# Patient Record
Sex: Male | Born: 1940 | Race: White | Hispanic: No | Marital: Married | Smoking: Former smoker
Health system: Southern US, Community
[De-identification: ages and names within clinical notes are randomized; demographics above are authoritative.]

## PROBLEM LIST (undated history)

## (undated) DIAGNOSIS — E7800 Pure hypercholesterolemia, unspecified: Secondary | ICD-10-CM

## (undated) DIAGNOSIS — Z9289 Personal history of other medical treatment: Secondary | ICD-10-CM

## (undated) DIAGNOSIS — I2510 Atherosclerotic heart disease of native coronary artery without angina pectoris: Secondary | ICD-10-CM

## (undated) DIAGNOSIS — I4891 Unspecified atrial fibrillation: Secondary | ICD-10-CM

## (undated) DIAGNOSIS — M109 Gout, unspecified: Secondary | ICD-10-CM

## (undated) DIAGNOSIS — M542 Cervicalgia: Secondary | ICD-10-CM

## (undated) DIAGNOSIS — N400 Enlarged prostate without lower urinary tract symptoms: Secondary | ICD-10-CM

## (undated) DIAGNOSIS — N19 Unspecified kidney failure: Secondary | ICD-10-CM

## (undated) DIAGNOSIS — D509 Iron deficiency anemia, unspecified: Secondary | ICD-10-CM

## (undated) DIAGNOSIS — M1990 Unspecified osteoarthritis, unspecified site: Secondary | ICD-10-CM

## (undated) DIAGNOSIS — M5030 Other cervical disc degeneration, unspecified cervical region: Secondary | ICD-10-CM

## (undated) DIAGNOSIS — D649 Anemia, unspecified: Secondary | ICD-10-CM

## (undated) DIAGNOSIS — N183 Chronic kidney disease, stage 3 unspecified: Secondary | ICD-10-CM

## (undated) DIAGNOSIS — M48061 Spinal stenosis, lumbar region without neurogenic claudication: Secondary | ICD-10-CM

## (undated) DIAGNOSIS — N289 Disorder of kidney and ureter, unspecified: Secondary | ICD-10-CM

## (undated) DIAGNOSIS — I10 Essential (primary) hypertension: Secondary | ICD-10-CM

## (undated) DIAGNOSIS — E669 Obesity, unspecified: Secondary | ICD-10-CM

## (undated) DIAGNOSIS — K219 Gastro-esophageal reflux disease without esophagitis: Secondary | ICD-10-CM

## (undated) DIAGNOSIS — G4733 Obstructive sleep apnea (adult) (pediatric): Secondary | ICD-10-CM

## (undated) DIAGNOSIS — I4510 Unspecified right bundle-branch block: Secondary | ICD-10-CM

## (undated) DIAGNOSIS — I82409 Acute embolism and thrombosis of unspecified deep veins of unspecified lower extremity: Secondary | ICD-10-CM

## (undated) DIAGNOSIS — M609 Myositis, unspecified: Secondary | ICD-10-CM

## (undated) DIAGNOSIS — Z9989 Dependence on other enabling machines and devices: Secondary | ICD-10-CM

## (undated) DIAGNOSIS — Z950 Presence of cardiac pacemaker: Secondary | ICD-10-CM

## (undated) DIAGNOSIS — Z8619 Personal history of other infectious and parasitic diseases: Secondary | ICD-10-CM

## (undated) DIAGNOSIS — N2889 Other specified disorders of kidney and ureter: Secondary | ICD-10-CM

## (undated) HISTORY — PX: INGUINAL HERNIA REPAIR: SUR1180

## (undated) HISTORY — DX: Unspecified right bundle-branch block: I45.10

## (undated) HISTORY — PX: TONSILLECTOMY: SUR1361

## (undated) HISTORY — DX: Dependence on other enabling machines and devices: Z99.89

## (undated) HISTORY — PX: APPENDECTOMY: SHX54

## (undated) HISTORY — DX: Obesity, unspecified: E66.9

## (undated) HISTORY — DX: Personal history of other medical treatment: Z92.89

## (undated) HISTORY — DX: Obstructive sleep apnea (adult) (pediatric): G47.33

## (undated) HISTORY — DX: Benign prostatic hyperplasia without lower urinary tract symptoms: N40.0

---

## 1990-01-09 HISTORY — PX: CORONARY ARTERY BYPASS GRAFT: SHX141

## 1997-10-15 ENCOUNTER — Ambulatory Visit (HOSPITAL_COMMUNITY): Admission: RE | Admit: 1997-10-15 | Discharge: 1997-10-15 | Payer: Self-pay | Admitting: Cardiology

## 1997-12-08 ENCOUNTER — Ambulatory Visit: Admission: RE | Admit: 1997-12-08 | Discharge: 1997-12-08 | Payer: Self-pay | Admitting: Cardiology

## 1999-10-21 ENCOUNTER — Inpatient Hospital Stay (HOSPITAL_COMMUNITY): Admission: EM | Admit: 1999-10-21 | Discharge: 1999-10-25 | Payer: Self-pay | Admitting: Emergency Medicine

## 1999-10-21 ENCOUNTER — Encounter: Payer: Self-pay | Admitting: Internal Medicine

## 1999-10-22 ENCOUNTER — Encounter: Payer: Self-pay | Admitting: Internal Medicine

## 1999-10-25 ENCOUNTER — Encounter: Payer: Self-pay | Admitting: Internal Medicine

## 2001-05-20 ENCOUNTER — Ambulatory Visit (HOSPITAL_COMMUNITY): Admission: RE | Admit: 2001-05-20 | Discharge: 2001-05-20 | Payer: Self-pay | Admitting: *Deleted

## 2001-05-20 ENCOUNTER — Encounter (INDEPENDENT_AMBULATORY_CARE_PROVIDER_SITE_OTHER): Payer: Self-pay

## 2003-05-06 ENCOUNTER — Encounter (INDEPENDENT_AMBULATORY_CARE_PROVIDER_SITE_OTHER): Payer: Self-pay | Admitting: Specialist

## 2003-05-06 ENCOUNTER — Ambulatory Visit (HOSPITAL_COMMUNITY): Admission: RE | Admit: 2003-05-06 | Discharge: 2003-05-06 | Payer: Self-pay | Admitting: *Deleted

## 2004-06-08 ENCOUNTER — Emergency Department (HOSPITAL_COMMUNITY): Admission: EM | Admit: 2004-06-08 | Discharge: 2004-06-08 | Payer: Self-pay | Admitting: *Deleted

## 2007-01-10 HISTORY — PX: CARDIAC CATHETERIZATION: SHX172

## 2009-05-09 DIAGNOSIS — Z9289 Personal history of other medical treatment: Secondary | ICD-10-CM

## 2009-05-09 HISTORY — DX: Personal history of other medical treatment: Z92.89

## 2009-06-29 ENCOUNTER — Emergency Department (HOSPITAL_COMMUNITY): Admission: EM | Admit: 2009-06-29 | Discharge: 2009-06-29 | Payer: Self-pay | Admitting: Emergency Medicine

## 2009-07-14 ENCOUNTER — Ambulatory Visit (HOSPITAL_BASED_OUTPATIENT_CLINIC_OR_DEPARTMENT_OTHER): Admission: RE | Admit: 2009-07-14 | Discharge: 2009-07-14 | Payer: Self-pay | Admitting: Urology

## 2010-01-29 ENCOUNTER — Encounter: Payer: Self-pay | Admitting: Internal Medicine

## 2010-03-27 LAB — URINE CULTURE
Colony Count: NO GROWTH
Culture: NO GROWTH

## 2010-03-27 LAB — POCT I-STAT, CHEM 8
BUN: 42 mg/dL — ABNORMAL HIGH (ref 6–23)
BUN: 42 mg/dL — ABNORMAL HIGH (ref 6–23)
Calcium, Ion: 1.26 mmol/L (ref 1.12–1.32)
Calcium, Ion: 1.15 mmol/L (ref 1.12–1.32)
Chloride: 105 mEq/L (ref 96–112)
Chloride: 105 mEq/L (ref 96–112)
Creatinine, Ser: 1.6 mg/dL — ABNORMAL HIGH (ref 0.4–1.5)
Creatinine, Ser: 1.9 mg/dL — ABNORMAL HIGH (ref 0.4–1.5)
Glucose, Bld: 109 mg/dL — ABNORMAL HIGH (ref 70–99)
Glucose, Bld: 123 mg/dL — ABNORMAL HIGH (ref 70–99)
HCT: 47 % (ref 39.0–52.0)
HCT: 51 % (ref 39.0–52.0)
Hemoglobin: 16 g/dL (ref 13.0–17.0)
Hemoglobin: 17.3 g/dL — ABNORMAL HIGH (ref 13.0–17.0)
Potassium: 4.2 mEq/L (ref 3.5–5.1)
Potassium: 4.1 mEq/L (ref 3.5–5.1)
Sodium: 141 mEq/L (ref 135–145)
Sodium: 138 mEq/L (ref 135–145)
TCO2: 28 mmol/L (ref 0–100)
TCO2: 27 mmol/L (ref 0–100)

## 2010-03-27 LAB — URINALYSIS, ROUTINE W REFLEX MICROSCOPIC
Bilirubin Urine: NEGATIVE
Glucose, UA: NEGATIVE mg/dL
Ketones, ur: NEGATIVE mg/dL
Nitrite: NEGATIVE
Protein, ur: 30 mg/dL — AB
Specific Gravity, Urine: 1.016 (ref 1.005–1.030)
Urobilinogen, UA: 1 mg/dL (ref 0.0–1.0)
pH: 6.5 (ref 5.0–8.0)

## 2010-03-27 LAB — URINE MICROSCOPIC-ADD ON

## 2010-04-10 HISTORY — PX: CORONARY ANGIOPLASTY WITH STENT PLACEMENT: SHX49

## 2010-04-11 ENCOUNTER — Ambulatory Visit
Admission: RE | Admit: 2010-04-11 | Discharge: 2010-04-11 | Disposition: A | Discharge status: Home / Self Care | Payer: Self-pay | Source: Ambulatory Visit | Attending: Cardiovascular Disease | Admitting: Cardiovascular Disease

## 2010-04-11 ENCOUNTER — Other Ambulatory Visit: Payer: Self-pay | Admitting: Cardiovascular Disease

## 2010-04-11 DIAGNOSIS — R0602 Shortness of breath: Secondary | ICD-10-CM

## 2010-04-12 ENCOUNTER — Observation Stay (HOSPITAL_COMMUNITY)
Admission: RE | Admit: 2010-04-12 | Discharge: 2010-04-13 | Disposition: A | Discharge status: Home / Self Care | Payer: Medicare Other | Source: Ambulatory Visit | Attending: Cardiovascular Disease | Admitting: Cardiovascular Disease

## 2010-04-12 DIAGNOSIS — Z01810 Encounter for preprocedural cardiovascular examination: Secondary | ICD-10-CM | POA: Insufficient documentation

## 2010-04-12 DIAGNOSIS — I2510 Atherosclerotic heart disease of native coronary artery without angina pectoris: Principal | ICD-10-CM | POA: Insufficient documentation

## 2010-04-12 DIAGNOSIS — I498 Other specified cardiac arrhythmias: Secondary | ICD-10-CM | POA: Insufficient documentation

## 2010-04-12 DIAGNOSIS — G4733 Obstructive sleep apnea (adult) (pediatric): Secondary | ICD-10-CM | POA: Insufficient documentation

## 2010-04-12 LAB — POCT ACTIVATED CLOTTING TIME: Activated Clotting Time: 387 seconds

## 2010-04-13 LAB — BASIC METABOLIC PANEL
BUN: 15 mg/dL (ref 6–23)
CO2: 22 mEq/L (ref 19–32)
Calcium: 8.8 mg/dL (ref 8.4–10.5)
Chloride: 108 mEq/L (ref 96–112)
Creatinine, Ser: 1.28 mg/dL (ref 0.4–1.5)
GFR calc Af Amer: >60 mL/min (ref >60)
GFR calc non Af Amer: 55 mL/min — ABNORMAL LOW (ref >60)
Glucose, Bld: 112 mg/dL — ABNORMAL HIGH (ref 70–99)
Potassium: 4 mEq/L (ref 3.5–5.1)
Sodium: 138 mEq/L (ref 135–145)

## 2010-04-13 LAB — CBC
HCT: 39.6 % (ref 39.0–52.0)
Hemoglobin: 13.3 g/dL (ref 13.0–17.0)
MCH: 30.2 pg (ref 26.0–34.0)
MCHC: 33.6 g/dL (ref 30.0–36.0)
MCV: 90 fL (ref 78.0–100.0)
Platelets: 139 10*3/uL — ABNORMAL LOW (ref 150–400)
RBC: 4.4 MIL/uL (ref 4.22–5.81)
RDW: 14.2 % (ref 11.5–15.5)
WBC: 6.3 10*3/uL (ref 4.0–10.5)

## 2010-04-21 NOTE — Discharge Summary (Signed)
Paul Price, Paul Price                ACCOUNT NO.:  [id number]  MEDICAL RECORD NO.:  [id number]           PATIENT TYPE:  O  LOCATION:  [id number]                         FACILITY:  [facility]  PHYSICIAN:  Shelva Majestic, M.D.     DATE OF BIRTH:  1940/07/03  DATE OF ADMISSION:  04/12/2010 DATE OF DISCHARGE:  04/13/2010                              DISCHARGE SUMMARY   DISCHARGE DIAGNOSES: 1. Positive nuclear stress test with new ischemia. 2. Coronary disease with bypass grafting in 1992 with graft     dysfunction by cath in 2009.     a.     New left anterior descending stenosis, undergoing      percutaneous transluminal coronary angioplasty and stent      deployment with a Promus drug-eluting stent to the left anterior      descending. 3. Obstructive sleep apnea with continuous positive airway pressure. 4. Normal ejection fraction of 55%. 5. Tachycardia and bradycardia with heart rate in the 50s.  He did     have several pauses.  He also had burst of tachycardia at rate of     100.     a.     Event monitor as an outpatient.  DISCHARGE CONDITION:  Stable.  PROCEDURES: 1. April 12, 2010, combined left heart cath with graft visualization by     Dr. Claiborne Billings. 2. April 12, 2010, percutaneous transluminal coronary angioplasty and     stent deployment to the left anterior descending stenosis with a     drug-eluting stent by Dr. Claiborne Billings.  DISCHARGE MEDICATIONS:  See medication reconciliation sheet from [facility]. Effient was added to his medications.  We did decrease his lisinopril to 20 mg until he sees Dr. Claiborne Billings.  We also held his hydrochlorothiazide for 2 days.  Also on his Ranexa, it had been increased to 1000 mg twice a day but it made him nauseated, so as his native LAD lesion was opened, we went ahead and decreased this to 500 mg twice a day.  Also for arthritic pains he has, we asked him to only take Tylenol, not extra aspirin as he is on Effient.  DISCHARGE INSTRUCTIONS: 1. Increase activity  slowly.  May shower.  No lifting for 1 week.  No     driving for 2 days.  Low-sodium heart-healthy diet. 2. Use CPAP as before. 3. Office will call you about a monitor for slow heart rate. 4. Wash cath site with soap and water.  Call if any bleeding,     swelling, or drainage. 5. Follow up with Dr. Claiborne Billings on April 22, 2010, at 9:15 a.m.  We will ensure he will not have trouble affording his Effient prior to his discharge.  HOSPITAL COURSE:  Paul Price presented for elective cardiac catheterization after positive stress test with his known history of coronary artery disease.  He had also had episodes of bradycardia noted in the office with adjustments to his beta-blocker.  He came in, underwent cardiac catheterization for a positive stress test, and was found to have 75% LAD stenosis of the native vessel, underwent PTCA and stent deployment with a Promus  drug-eluting stent.  The patient tolerated the procedure.  Postprocedure by the morning of April 13, 2010, he ambulated well without complications.  Blood pressure 121/58, pulse 59, respiratory rate 18, temperature 97.5, oxygen saturation 96%.  Heart regular rate and rhythm.  Lungs were clear. Groin was stable.  Sodium 138, potassium 4, BUN 15, creatinine 1.28, glucose 112.  Hemoglobin 13.3, hematocrit 39.6, platelets 139, WBC 6.3.  Please note the patient has syncope on nitrates.  Dr. Gwenlyn Found saw him and felt he was ready for discharge.  He will follow up with Dr. Claiborne Billings.     Otilio Carpen. Dorene Ar, N.P.   ______________________________ Shelva Majestic, M.D.    LRI/MEDQ  D:  04/13/2010  T:  04/13/2010  Job:  [id number]  cc:   Lynnell Chad. Shelia Media, M.D.  Electronically Signed by Cecilie Kicks N.P. on 04/15/2010 08:05:30 AM Electronically Signed by Shelva Majestic M.D. on 04/21/2010 10:54:08 AM

## 2010-04-21 NOTE — Procedures (Signed)
NAMETANYON, SAMS NO.:  [id number]  MEDICAL RECORD NO.:  [id number]           PATIENT TYPE:  O  LOCATION:  [id number]                         FACILITY:  [facility]  PHYSICIAN:  Shelva Majestic, M.D.     DATE OF BIRTH:  1940/10/19  DATE OF PROCEDURE:  04/12/2010 DATE OF DISCHARGE:                           CARDIAC CATHETERIZATION   INDICATIONS:  [name] is a 70 year old gentleman who underwent CABG revascularization surgery remotely and had a LIMA placed to his LAD and a vein to his diagonal vessel.  In 2009, cardiac catheterization showed an atretic LIMA graft to his LAD and he had a patent vein graft to his diagonal vessel.  He had 70% stenosis in the LAD after diagonal and septal perforating artery proximal to the LIMA insertion. Subsequent nuclear imaging since then has shown only mild thinning in the anterior to anteroseptal segment without ischemia.  Recently, he had developed episodes of recurrent chest pain.  He underwent a nuclear perfusion study last week which now demonstrated a moderate area of new ischemia in the mid distal LAD territory concordant with his LAD stenosis.  He was seen in the office yesterday.  His Ranexa dose was increased.  He is scheduled for cardiac catheterization today.  We will do cardiac catheterization today with probable PCI to his LAD system in light of physiologic ischemia noted on his functional study.  PROCEDURE:  After premedication with Versed 2 mg plus fentanyl 50 mcg, the patient was prepped and draped in usual fashion.  His right femoral artery was punctured anteriorly and a 5-French sheath was inserted. Diagnostic cardiac catheterization was done utilizing 5-French Judkins for left and right coronary catheters.  The right catheter was used for selective angiography and the vein graft supplying the diagonal vessel as well as selective angiography in the left internal mammary artery.  A 5-French pigtail catheter was  used for RAO ventriculography.  With the demonstration of slightly progressive 75% LAD stenosis with clear-cut objective evidence for ischemia on his nuclear scan with increasing symptomatology of chest pain,the decision was made to proceed with intervention to this LAD system.  The 5-French sheath was upgraded to a 6-French system.  Bivalirudin bolus plus infusion was administered.  The patient received 60 mg of oral Effient for antiplatelet therapy.  He already received aspirin today.  A 6-French [id number] guide was used.  The patient did have three doses of 100 mcg of intracoronary nitroglycerin during the procedure and tolerated this well with increased fluid hydration.  After demonstration of therapeutic anticoagulation, the Prowater wire was advanced down the LAD system.  Predilatation was done utilizing a 2.5- x 12-mm apex balloon.  A 3.0- x 20-mm DES Promus Element stent was then inserted and dilated x2 up to 13 atmospheres.  A 3.25- x 15-mm noncompliant Quantum balloon was used for poststent dilatation up to 3.25 mm.  [facility]out angiography confirmed an excellent angiographic result with the 75% to 80% stenosis being reduced to 0%. The arterial sheath was sutured in place with plans for sheath removal later today.  HEMODYNAMIC DATA:  Central aortic pressure was 112/55, left  ventricular pressure was 112/10, post A-wave 19.  ANGIOGRAPHIC DATA: 1. Left main coronary artery was a long vessel which had mild 10% to     less than 20% ostial smooth narrowing.  The left main coronary     artery bifurcated into an LAD and left circumflex system. 2. The LAD gave rise to a proximal diagonal vessel which had been     bypassed with the previous vein graft.  Just after this diagonal     vessel, there was a moderate-sized septal perforating artery and     following this was 75% to 80% area of narrowing in the LAD.  The     remainder of the LAD was free of significant disease. 3. The circumflex vessel  was angiographically normal and gave rise to     one major marginal vessel. 4. The right coronary artery was angiographically normal dominant     vessel which supplied the PDA and inferior LV branch and large     posterolateral system. 5. The vein graft supplying the diagonal vessel was widely patent. 6. The LIMA graft which had supplied the LAD was atretic and did not     reach the mid LAD. 7. The RAO ventriculography revealed preserved global contractility     with ejection fraction of at least 55% with only a very minimal     area of mild mid anterolateral hypocontractility.  Following percutaneous coronary intervention to the native LAD with PTCA, stenting with a 3.0- x 20-mm DES Promus Element stent postdilated to 3.25 mm, the 75% to 80% stenosis was reduced to 0%.  There was brisk TIMI 3 flow.  There was no evidence for dissection.  IMPRESSION: 1. Normal left ventricular function with a very minimal zone of mid     anterolateral hypocontractility. 2. A 10% to 20% narrowing in the ostium of the left main with 75%     stenosis in the mid left anterior descending after the bypassed     diagonal vessel and septal perforating artery. 3. Normal circumflex system. 4. Normal dominant right coronary artery. 5. Patent vein graft supplying the diagonal vessel. 6. Atretic left internal mammary artery graft which had previously     supplied the left anterior descending. 7. Successful percutaneous coronary intervention to the left anterior     descending with the 75% to 80% stenosis being reduced to 0% with     ultimate insertion of a 3.0- x 20-mm drug-eluting stent Promus     Element stent postdilated to 3.25 mm. 8. Angiomax/60 mg oral Effient/IC nitroglycerin.          ______________________________ Shelva Majestic, M.D.     TK/MEDQ  D:  04/12/2010  T:  04/13/2010  Job:  [id number]  cc:   Lynnell Chad. Shelia Media, M.D.  Electronically Signed by Shelva Majestic M.D. on 04/21/2010 10:54:05 AM

## 2010-05-27 NOTE — Discharge Summary (Signed)
[place]. [facility]  Patient:    Paul Price, Paul Price                       MRN: [id number] Adm. Date:  [id number] Disc. Date: [id number] Attending:  Horatio Pel CC:         Alla German, M.D.   Discharge Summary  LABORATORY DATA:  Total cholesterol 145, with LDL 84, HDL 33 (low), triglycerides 140.  His urinalysis showed growth and 300 mg/dl of protein, otherwise negative.  Troponin-I was slightly high at 0.05.  CK was 78, MB 1.8. Magnesium was normal and CMP was normal, except for slightly high glucose of 127 on one sample.  His initial INR was 1.0.  White count was 6.5, hematocrit 47.4, platelet count 128,000.  RADIOLOGY DATA:  CT head without contrast medium revealed possible small old lacunar infarct in the inferior aspect of the right basal ganglia.  No definite acute abnormality.  Chest x-ray revealed cardiomegaly, with no active disease.  Repeat chest film showed improvement in overall aeration with some mild cardiomegaly.  No active infiltrate or CHF seen.  EKG revealed normal sinus rhythm, right bundle branch block, LVH with QRS widening.  There was possible lateral ischemia.  HOSPITAL COURSE:  Please see admission history and physical on October 21, 1999, for details of his presentation.  Briefly, he presented with intermittent nonradiating left chest pain.  He had significant dyspnea on exertion and had known coronary artery disease.  He was seen in consultation by cardiology and placed on IV heparin.  He ruled out for MI and his telemetry results were unremarkable.  He remained comfortable over the weekend and then o Monday underwent cardiac catheterization.  The IMA was small with some distal disease, but the LAD received flow throughout.  Dr. Tami Ribas felt that it was most appropriate to proceed with medical management.  Please see cardiac catheterization report for full details.  Also, during this hospital stay he had a severe  headache and, therefore, MRI of the brain was ordered.  This will be followed up as an outpatient with carotid ultrasound.  Also, he was noted to have significant hypertension and his antihypertensive medications were adjusted during the hospital stay.  DISPOSITION:  He was discharged to home.  DISCHARGE MEDICATIONS: 1.  Pravachol 20 mg p.o. each evening. 2.  Aspirin 325 mg daily. 3.  Prevacid 30 mg daily. 4.  Altace 10 mg p.o. b.i.d. 5.  Metoprolol 50 mg p.o. t.i.d. 6.  Ultram 50 mg 1-2 t.i.d. as needed for headache.  DISCHARGE ACTIVITY:  No strenuous activity, no sexual activity, no lifting over five pounds for three days.  DIET:  He is to have a low cholesterol and no added salt diet.  WOUND CARE: As per the post catheterization instructions.  He is given wound instructions.  FOLLOWUP:  He will follow up with Dr. Shelia Media on Thursday with CBC and with Dr. Tami Ribas in two weeks.  IMPRESSION:  1.  Unstable angina.  2.  Hypertension.  3.  Gastroesophageal reflux.  4.  Hyperlipidemia.  5.  Headache.  6.  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  7.  Minimal thrombocytopenia.  8.  Severe intolerance to NITROGLYCERIN.  9.  Multiple surgeries:          a. Appendectomy.          b. Tonsillectomy.          c. Coronary artery bypass grafting. 10. History  of syncope with nitroglycerin treatment. 11. History of allergic rhinitis. DD:  11/04/99 TD:  11/05/99 Job: [id number]

## 2010-05-27 NOTE — Op Note (Signed)
NAME:  Paul Price, Paul Price                          ACCOUNT NO.:  [id number]   MEDICAL RECORD NO.:  [id number]                   PATIENT TYPE:  AMB   LOCATION:  ENDO                                 [facility]ACILITY[facility]  [facility]   PHYSICIAN:  Waverly Ferrari, M.D.                 DATE OF BIRTH:  [birth year]   DATE OF PROCEDURE:  DATE OF DISCHARGE:                                 OPERATIVE REPORT   PROCEDURE:  Upper endoscopy with biopsy.   INDICATIONS FOR PROCEDURE:  Hemoccult positivity.   ANESTHESIA:  Demerol 40, Versed 6 mg.   DESCRIPTION OF PROCEDURE:  With the patient mildly sedated in the left  lateral decubitus position, the Olympus videoscopic endoscope was inserted  in the mouth and passed under direct vision through the esophagus which  appeared normal. There was no evidence of Barrett's esophagus.  We entered  into the stomach and it was quite erythematous and patchy, measles like  distribution.  This was photographed and multiple biopsies taken. We entered  into the duodenal bulb which showed mild duodenitis, the second portion of  the duodenum appeared normal. From this point, the endoscope was slowly  withdrawn taking circumferential views of the duodenal mucosa until the  endoscope was then pulled back in the stomach, placed in retroflexion to  view the stomach from below. The endoscope was straightened and withdrawn  taking circumferential views of the remaining gastric and esophageal mucosa.  The patient's vital signs and pulse oximeter remained stable. The patient  tolerated the procedure well without apparent complications.   FINDINGS:  Changes of gastritis probably H. pylori.  Await biopsy report.  The patient will call me for results and followup with me as an outpatient.  Proceed to colonoscopy as planned.                                               Waverly Ferrari, M.D.    GMO/MEDQ  D:  05/06/2003  T:  05/06/2003  Job:  [id number]

## 2010-05-27 NOTE — Op Note (Signed)
NAME:  WILFREDO, SANA                          ACCOUNT NO.:  [id number]   MEDICAL RECORD NO.:  [id number]                   PATIENT TYPE:  AMB   LOCATION:  ENDO                                 FACILITY:  [facility]   PHYSICIAN:  Waverly Ferrari, M.D.                 DATE OF BIRTH:  26-Oct-1940   DATE OF PROCEDURE:  05/06/2003  DATE OF DISCHARGE:                                 OPERATIVE REPORT   PROCEDURE:  Colonoscopy.   INDICATIONS:  Hemoccult positivity.   ANESTHESIA:  None given.   PROCEDURE:  With the patient mildly sedated in the left lateral decubitus  position, rectal examination was performed, which was unremarkable.  Subsequently the Olympus videoscopic colonoscope was inserted in the rectum  and passed under direct vision into the cecum, identified by ileocecal valve  and base of cecum, both of which were photographed.  From this point the  colonoscope was slowly withdrawn taking circumferential views of the colonic  mucosa, stopping only in the rectum, which appeared normal on direct and  showed hemorrhoids on retroflexed view.  The endoscope was straightened and  withdrawn.  The patient's vital signs and pulse oximetry remained stable.  The patient tolerated the procedure well without apparent complication.   FINDINGS:  Internal hemorrhoids, otherwise unremarkable colonoscopic  examination other than some scattered diverticula.   PLAN:  See endoscopy note for further details.                                               Waverly Ferrari, M.D.    GMO/MEDQ  D:  05/06/2003  T:  05/06/2003  Job:  [id number]

## 2010-05-27 NOTE — Cardiovascular Report (Signed)
[place]. [facility]  Patient:    Paul Price, Paul Price                       MRN: [id number] Proc. Date: 10/24/99 Adm. Date:  [id number] Attending:  Horatio Pel CC:         Paul Price, M.D.  Paul Price, M.D.   Cardiac Catheterization  PROCEDURES: 1. Left heart catheterization. 2. Coronary angiography. 3. Left ventriculogram. 4. Saphenous vein grafts. 5. Left internal mammary angiography. 6. Selective renal angiogram.  ATTENDING FOR THE CASE:  Dr. Alla German.  COMPLICATIONS:  None.  INDICATIONS:  Paul Price is a 70 year old white male patient of Paul Price with a history of systemic hypertension, CAD, status post coronary bypass surgery consisting of a LIMA to LAD and saphenous vein graft to a diagonal.  The patient has recently developed new onset chest pain.  He underwent stress Cardiolite which, by report, revealed no evidence of ischemia.  He is now referred for repeat cardiac catheterization to redefine his coronary anatomy.  DESCRIPTION OF OPERATION:  After given informed written consent, the patient was brought to the cardiac catheterization where his right and left groin was shaved, prepped, and draped in the usual sterile fashion.  ECG monitoring  was established.  Using a modified Seldinger technique, a #6 French arterial sheath was inserted in the right femoral artery.  A #6 French diagnostic catheter was then used to perform diagnostic angiography.  This reveals a medium-sized left main with a 50% ostial lesion.  The LAD is a medium-sized vessel which coursed to the apex giving rise to one diagonal branch.  The LAD is noted to have a 50% stenotic lesion after the takeoff of the first diagonal.  The mid and distal LAD fill competitively through both left internal mammary artery as well as in retrograde fashion from the saphenous vein graft to the diagonal.  There is noted to be an LAD graft which inserts into  the mid portion of the LAD.  The IMA is a small vessel with an 80% stenotic lesion at its insertion site.  However, there is excellent flow throughout the LAD beyond the graft insertion which also retrograde fills the entire left system.  The remainder of the LAD has no significant disease.  The first diagonal is a medium-sized vessel which is filled in both an antegrade fashion and via a patent saphenous vein graft.  There is no disease in the body of the graft or distal to the graft insertion.  Circumflex is a large vessel which coursed in the AV groove and gave rise to one large, bifurcating obtuse marginal.  The AV groove circumflex has no significant disease.  The first OM is a large vessel which bifurcates in its proximal portion and has no significant disease.  The right coronary artery is a large vessel which is dominant and gives rise to both the PDA as well as the posterior lateral branch.  There is a 30% ostial RCA lesion.  The PDA is a medium-sized vessel with no significant disease.  The PLA bifurcates in its mid segment and has no significant disease.  Left ventriculogram reveals anterior and anterolateral hypokinesis and estimated EF of 40%.  There is no significant MR.  Selective renal angiograms reveal no evidence of renal artery stenosis.  HEMODYNAMICS:  Systemic arterial pressure 180/100, LV systemic pressure 184/18, LVEDP 22.  CONCLUSIONS: 1. Patent but diseased left internal mammary artery to  the left anterior    descending. 2. Patent saphenous vein graft to the first diagonal. 3. Mildly depressed left ventricular systolic function with wall motion    abnormality as noted above. 4. Systemic hypertension. 5. No evidence of renal artery stenosis.  DISCUSSION:  Paul Price does have disease throughout the distal portion of his IMA to LAD; however, the LAD receives excellent blood flow in an antegrade fashion as well as in a retrograde fashion from the saphenous  vein graft to the diagonal.  There is also excellent flow throughout the IMA which retrogradely fills the entire left system.  This does not appear to be an area of ischemia.  The RCA has no significant disease.  He is severely hypertensive in the lab today and I wonder if his symptoms could be secondary to diastolic dysfunction with a stiff left ventricle secondary to hypertension.  Will plan to aggressively treat his hypertension and medically manage his CAD. DD:  10/24/99 TD:  10/24/99 Job: [id number]

## 2010-05-27 NOTE — Procedures (Signed)
[facility]  Patient:    SAVIEL, WAPLE Visit Number: [id number] MRN: [id number]          Service Type: END Location: ENDO Attending Physician:  Jim Desanctis Dictated by:   Jim Desanctis, M.D. Proc. Date: 05/20/01 Admit Date:  05/20/2001                             Procedure Report  PROCEDURE:  Upper endoscopy.  INDICATIONS:  GERD.  ANESTHESIA:  Demerol 70 mg, Versed 6 mg.  DESCRIPTION OF PROCEDURE:  With the patient mildly sedated in the left lateral decubitus position, the Olympus videoscopic endoscope was inserted in the mouth and passed under direct vision through the esophagus, which appeared normal, into the stomach. Fundus, body, antrum, duodenal bulb, second portion of duodenum were visualized and photographs taken. From this point, the endoscope was slowly withdrawn taking circumferential views of the entire duodenal mucosa until the endoscope was then pulled back into the stomach and placed in retroflexion to view the stomach from below. The endoscope was then straightened and withdrawn taking circumferential views of the remaining gastric and esophageal mucosa stopping to photograph and biopsy changes of erythema seen in the antrum and body of the stomach. The patients vital signs and pulse oximeter remained stable. The patient tolerated the procedure well without apparent complications.  FINDINGS:  Erythema of gastric body and antrum.  PLAN:  Await biopsy report. The patient will call Dr. Lajoyce Corners for results and follow up with Dr. Lajoyce Corners as an outpatient. Proceed to colonoscopy as planned. Dictated by:   Jim Desanctis, M.D. Attending Physician:  Jim Desanctis DD:  05/20/01 TD:  05/20/01 Job: [id number]

## 2010-05-27 NOTE — H&P (Signed)
[facility]  Patient:    Paul Price, Paul Price                         MRN: [id number] Adm. Date:  10/21/99 Attending:  Lynnell Chad. Shelia Media, M.D. CC:         Minette Brine. Glade Lloyd, M.D.   History and Physical  GMA:  #[id number]  HISTORY OF PRESENT ILLNESS:  Paul Price is a 70 year old married white resident of [place] who comes in with a chief complaint of high blood pressure.  His wife noted that his blood pressure had been running [id number] to [id number] diastolic and his systolic pressure [id number] to [id number] in the past couple of weeks.  Therefore, he came into my office early this morning for evaluation.  At that time, he noted incidentally that he had also been having chest discomfort over the last six weeks.  It was not as severe as when he had angina before.  It was in his left anterior chest, not radiating, with no associated nausea, dyspnea or sweating.  He is more short of breath on exertion than he used to be, but it has really not changed over the past several weeks.  Interestingly, he has had a Persantine thallium study which was done on August 11, 1999, which showed "negative adequate Bruce protocol exercise stress test revealing scintigraphic evidence of anteroseptal wall scar with no evidence of significant reversibility."  Dynamic gating reveals a preserve EF calculated at 57%.  He does have known coronary artery disease and is status post CABG.  He adds that he cannot take nitroglycerin due to passing out due to low blood pressure.  PAST MEDICAL HISTORY:  He has had flexible sigmoidoscopy revealing rare diverticulum, hypertension and coronary artery disease.  CURRENT MEDICATIONS: 1. Metoprolol 50 mg daily. 2. Altace 5 mg daily. 3. Allegra 60 mg p.r.n. 4. Prevacid 30 mg daily. 5. Aspirin 325 mg daily. 6. Norvasc 5 mg daily.  ALLERGIES:  None.  Intolerance - nitroglycerin - see above.  FAMILY HISTORY:  Father died of CAD.  Mother died of aneurysm.  His  paternal grandfather had diabetes.  SOCIAL HISTORY:  Married and has three children who are alive and well.  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  No tobacco use. Occasional ethanol use.  REVIEW OF SYSTEMS:  He had some recent increase in weight, recent headaches, mood swings and recent swelling in feet and ankles.  No orthopnea or PND have been noted.  No decrease in weight, night sweats, fatigue, hot tendency, cold tendency, poor appetite, increased thirst, depression, anxiety, memory loss, seizures, faints, numbness, tingling, tremors, lumps or bumps, skin changes, easy bruising or easy bleeding.  He also denies any change in vision, hearing change for ringing in ears, balance trouble, nose and sinus trouble, hay fever, sore throat, hoarseness, wheezing, coughing, hemoptysis, palpitations, leg cramps, varicose veins, heart burn, trouble swallowing, nausea, vomiting, diarrhea, constipation, hematemesis, vertigo, blood and bowel movement or black bowel movements.  He denies any jaundice, abdominal pain, rectal pain, change in bowel habits, kidney stones, change in urination, burning or discharge with urination, prostate trouble, sexual difficulties, back pain, neck pain, aching muscles, aching joints or swollen joints.  PHYSICAL EXAMINATION:  VITAL SIGNS:  Blood pressure 186/94, pulse 76, weight 207.  GENERAL:  He is overweight in no acute distress.  SKIN:  Exam appears within normal  limits.  There is no cervical, supraclavicular, axillary or inguinal adenopathy.  HEENT:  Normocephalic, atraumatic.  EOMI.  Conjunctivae appear normal.  PERRL. Tympanic membranes are normal.  Tongue and posterior pharynx appear normal.  NECK:  Supple.  No thyromegaly.  No neck masses.  LUNGS:  Clear to auscultation.  Respirations are normal.  HEART:  Regular rate and rhythm, normal S1 and S2.  No  murmurs, rubs, or gallops.  No JVD.  No edema.  EXTERNAL GENITALIA:  Normal external male genitalia, circumcised, normal testicles.  RECTAL:  Rectal tone is normal.  Stool is brown and heme negative.  Prostate feels normal.  EXTREMITIES:  Within normal limits.  No clubbing, cyanosis, or edema.  NEUROLOGIC:  He is alert and oriented x3 with cranial nerves II-XII intact. Equal strength both lower extremities with 2+ equal deep tendon reflexes in knees.  Normal speech.  Gait is not tested at this time.  IMPRESSION: 1. Unstable angina.  EKG:  Shows new lateral ST depression and T-wave    inversion.  Admit, rule out myocardial infarction, cardiology consultation.    Suspect he will need cardiac catheterization. 2. Hypertension.  Poor control.  Will increase his beta blocker dose. 3. History of allergic rhinitis. 4. History of syncope with nitroglycerin treatment. 5. Status post multiple surgeries.    a. Appendectomy.    b. Tonsillectomy.    c. Coronary artery bypass graft. 6. History of hyperlipidemia. DD:  10/21/99 TD:  10/21/99 Job: [id number]

## 2010-05-27 NOTE — Procedures (Signed)
[facility]  Patient:    Paul Price, Paul Price Visit Number: [id number] MRN: [id number]          Service Type: END Location: ENDO Attending Physician:  Jim Desanctis Dictated by:   Jim Desanctis, M.D. Proc. Date: 05/20/01 Admit Date:  05/20/2001                             Procedure Report  PROCEDURE:  Colonoscopy.  INDICATIONS:  Colon polyps.  ANESTHESIA:  None further given.  DESCRIPTION OF PROCEDURE:  With the patient mildly sedated in the left lateral decubitus position, the Olympus videoscopic colonoscope was inserted in the rectum after rectal exam was performed and passed under direct vision to the cecum, identified by the ileocecal valve and appendiceal orifice, both of which were photographed. From this point, the colonoscope was slowly withdrawn taking circumferential views of the entire colonic mucosa stopping only in the ascending colon where a small polyp was seen, photographed, and removed using hot biopsy forceps technique, setting of 20/20 blended current and also at what appeared to be the transverse colon, where a second polyp was seen, photographed and removed, again using hot biopsy forceps technique, again with a setting of 20/20 blended current. This was done until the endoscope was pulled all the way to the rectum, which appeared normal in direct and retroflex view. The endoscope was straightened and withdrawn. The patients vital signs and pulse oximeter remained stable. The patient tolerated the procedure well without apparent complications.  FINDINGS:  Small polyps of colon as described above, removed.  PLAN:  Await biopsy report. The patient will call Dr. Lajoyce Corners for results and follow up with Dr. Lajoyce Corners as an outpatient. Dictated by:   Jim Desanctis, M.D. Attending Physician:  Jim Desanctis DD:  05/20/01 TD:  05/20/01 Job: [id number]

## 2010-06-08 ENCOUNTER — Other Ambulatory Visit: Payer: Self-pay | Admitting: Orthopedic Surgery

## 2010-06-08 DIAGNOSIS — M25511 Pain in right shoulder: Secondary | ICD-10-CM

## 2011-06-10 DIAGNOSIS — Z9289 Personal history of other medical treatment: Secondary | ICD-10-CM

## 2011-06-10 HISTORY — DX: Personal history of other medical treatment: Z92.89

## 2011-07-19 ENCOUNTER — Emergency Department (HOSPITAL_COMMUNITY)
Admission: EM | Admit: 2011-07-19 | Discharge: 2011-07-20 | Disposition: A | Discharge status: Home / Self Care | Payer: Medicare Other | Attending: Emergency Medicine | Admitting: Emergency Medicine

## 2011-07-19 DIAGNOSIS — Z79899 Other long term (current) drug therapy: Secondary | ICD-10-CM | POA: Insufficient documentation

## 2011-07-19 DIAGNOSIS — I2510 Atherosclerotic heart disease of native coronary artery without angina pectoris: Secondary | ICD-10-CM | POA: Insufficient documentation

## 2011-07-19 DIAGNOSIS — R339 Retention of urine, unspecified: Secondary | ICD-10-CM

## 2011-07-19 DIAGNOSIS — I10 Essential (primary) hypertension: Secondary | ICD-10-CM | POA: Insufficient documentation

## 2011-07-19 HISTORY — DX: Essential (primary) hypertension: I10

## 2011-07-19 HISTORY — DX: Atherosclerotic heart disease of native coronary artery without angina pectoris: I25.10

## 2011-07-20 ENCOUNTER — Encounter (HOSPITAL_COMMUNITY): Payer: Self-pay | Admitting: *Deleted

## 2011-07-20 LAB — CBC
HCT: 43.2 % (ref 39.0–52.0)
Hemoglobin: 14.4 g/dL (ref 13.0–17.0)
MCH: 28.2 pg (ref 26.0–34.0)
MCHC: 33.3 g/dL (ref 30.0–36.0)
MCV: 84.7 fL (ref 78.0–100.0)
Platelets: 137 10*3/uL — ABNORMAL LOW (ref 150–400)
RBC: 5.1 MIL/uL (ref 4.22–5.81)
RDW: 14.8 % (ref 11.5–15.5)
WBC: 5.1 10*3/uL (ref 4.0–10.5)

## 2011-07-20 LAB — DIFFERENTIAL
Basophils Absolute: 0 10*3/uL (ref 0.0–0.1)
Basophils Relative: 0 % (ref 0–1)
Eosinophils Absolute: 0.2 10*3/uL (ref 0.0–0.7)
Eosinophils Relative: 4 % (ref 0–5)
Lymphocytes Relative: 16 % (ref 12–46)
Lymphs Abs: 0.8 10*3/uL (ref 0.7–4.0)
Monocytes Absolute: 0.5 10*3/uL (ref 0.1–1.0)
Monocytes Relative: 10 % (ref 3–12)
Neutro Abs: 3.6 10*3/uL (ref 1.7–7.7)
Neutrophils Relative %: 70 % (ref 43–77)

## 2011-07-20 LAB — URINALYSIS, ROUTINE W REFLEX MICROSCOPIC
Bilirubin Urine: NEGATIVE
Glucose, UA: NEGATIVE mg/dL
Ketones, ur: NEGATIVE mg/dL
Leukocytes, UA: NEGATIVE
Nitrite: NEGATIVE
Protein, ur: 30 mg/dL — AB
Specific Gravity, Urine: 1.011 (ref 1.005–1.030)
Urobilinogen, UA: 0.2 mg/dL (ref 0.0–1.0)
pH: 6.5 (ref 5.0–8.0)

## 2011-07-20 LAB — BASIC METABOLIC PANEL
BUN: 35 mg/dL — ABNORMAL HIGH (ref 6–23)
BUN: 31 mg/dL — ABNORMAL HIGH (ref 6–23)
BUN: 29 mg/dL — ABNORMAL HIGH (ref 6–23)
CO2: 25 mEq/L (ref 19–32)
CO2: 24 mEq/L (ref 19–32)
CO2: 24 mEq/L (ref 19–32)
Calcium: 9.7 mg/dL (ref 8.4–10.5)
Calcium: 9.2 mg/dL (ref 8.4–10.5)
Calcium: 9 mg/dL (ref 8.4–10.5)
Chloride: 100 mEq/L (ref 96–112)
Chloride: 105 mEq/L (ref 96–112)
Chloride: 106 mEq/L (ref 96–112)
Creatinine, Ser: 2.14 mg/dL — ABNORMAL HIGH (ref 0.50–1.35)
Creatinine, Ser: 1.84 mg/dL — ABNORMAL HIGH (ref 0.50–1.35)
Creatinine, Ser: 1.75 mg/dL — ABNORMAL HIGH (ref 0.50–1.35)
GFR calc Af Amer: 34 mL/min — ABNORMAL LOW (ref >90)
GFR calc Af Amer: 40 mL/min — ABNORMAL LOW (ref >90)
GFR calc Af Amer: 43 mL/min — ABNORMAL LOW (ref >90)
GFR calc non Af Amer: 29 mL/min — ABNORMAL LOW (ref >90)
GFR calc non Af Amer: 35 mL/min — ABNORMAL LOW (ref >90)
GFR calc non Af Amer: 37 mL/min — ABNORMAL LOW (ref >90)
Glucose, Bld: 137 mg/dL — ABNORMAL HIGH (ref 70–99)
Glucose, Bld: 113 mg/dL — ABNORMAL HIGH (ref 70–99)
Glucose, Bld: 110 mg/dL — ABNORMAL HIGH (ref 70–99)
Potassium: 3.5 mEq/L (ref 3.5–5.1)
Potassium: 3.9 mEq/L (ref 3.5–5.1)
Potassium: 3.7 mEq/L (ref 3.5–5.1)
Sodium: 137 mEq/L (ref 135–145)
Sodium: 140 mEq/L (ref 135–145)
Sodium: 140 mEq/L (ref 135–145)

## 2011-07-20 LAB — URINE MICROSCOPIC-ADD ON

## 2011-07-20 MED ORDER — LIDOCAINE HCL 2 % EX GEL
CUTANEOUS | Status: AC
  Administered 2011-07-20: 10
  Filled 2011-07-20: qty 10

## 2011-07-20 MED ORDER — SODIUM CHLORIDE 0.9 % IV BOLUS (SEPSIS)
1000.0000 mL | Freq: Once | INTRAVENOUS | Status: AC
  Administered 2011-07-20: 1000 mL via INTRAVENOUS

## 2011-07-20 MED ORDER — TAMSULOSIN HCL 0.4 MG PO CAPS: 0.4000 mg | ORAL_CAPSULE | Freq: Every day | ORAL | Status: DC

## 2011-07-20 NOTE — ED Notes (Signed)
Pt in c/o urinary retention since 6pm, states this happens when he has a kidney infection, states he noted dysuria earlier today

## 2011-07-20 NOTE — ED Provider Notes (Signed)
History     CSN: [id number]  Arrival date & time 07/19/11  2354   First MD Initiated Contact with Patient 07/20/11 0036      Chief Complaint  Patient presents with  . Urinary Retention    (Consider location/radiation/quality/duration/timing/severity/associated sxs/prior treatment) HPI Patient is a 71 yo male who presents with sudden onset of lower abdominal pain and inability to urinate since 1800.  Patient has history of urinary tract infections and has felt some urinary symptoms similar to this in the past but has never had any retention like this.  Patient reported his pain was 8/10.  He denies nausea, vomiting, constipation, diarrhea, or fever.  There are no other associated or modifying factors.  Past Medical History  Diagnosis Date  . Hypertension   . Coronary artery disease   . History of percutaneous left heart catheterization 04/2010    Past Surgical History  Procedure Date  . Cardiac surgery 04/2010    open heart - 1 stent placed  . Coronary angioplasty with stent placement     History reviewed. No pertinent family history.  History  Substance Use Topics  . Smoking status: Former Research scientist (life sciences)  . Smokeless tobacco: Not on file  . Alcohol Use: No      Review of Systems  Constitutional: Negative.   HENT: Negative.   Eyes: Negative.   Respiratory: Negative.   Cardiovascular: Negative.   Gastrointestinal: Positive for abdominal pain.  Genitourinary: Positive for decreased urine volume and difficulty urinating.  Musculoskeletal: Negative.   Skin: Negative.   Neurological: Negative.   Hematological: Negative.   Psychiatric/Behavioral: Negative.   All other systems reviewed and are negative.    Allergies  Review of patient's allergies indicates no known allergies.  Home Medications   Current Outpatient Rx  Name Route Sig Dispense Refill  . AMLODIPINE BESYLATE 5 MG PO TABS Oral Take 5 mg by mouth daily.    . ASPIRIN 325 MG PO TABS Oral Take 325 mg by  mouth daily.    . FENOFIBRATE MICRONIZED 134 MG PO CAPS Oral Take 134 mg by mouth daily before breakfast.    . HYDROCHLOROTHIAZIDE 12.5 MG PO CAPS Oral Take 12.5 mg by mouth daily.    Marland Kitchen LISINOPRIL 40 MG PO TABS Oral Take 40 mg by mouth daily.    Marland Kitchen METOPROLOL TARTRATE 50 MG PO TABS Oral Take 75 mg by mouth 2 (two) times daily.    Marland Kitchen OMEPRAZOLE 20 MG PO CPDR Oral Take 20 mg by mouth daily.    Marland Kitchen PRASUGREL HCL 10 MG PO TABS Oral Take 10 mg by mouth.    . ROSUVASTATIN CALCIUM 10 MG PO TABS Oral Take 10 mg by mouth daily.    Marland Kitchen TAMSULOSIN HCL 0.4 MG PO CAPS Oral Take 1 capsule (0.4 mg total) by mouth daily. 30 capsule 0    BP 159/68  Pulse 64  Temp 97.5 F (36.4 C) (Oral)  Resp 16  Ht 5\' 7"  (1.702 m)  Wt 214 lb (97.07 kg)  BMI 33.52 kg/m2  SpO2 98%  Physical Exam  Nursing note and vitals reviewed. GEN: Well-developed, well-nourished male in no distress HEENT: Atraumatic, normocephalic. Oropharynx clear without erythema EYES: PERRLA BL, no scleral icterus. NECK: Trachea midline, no meningismus CV: regular rate and rhythm. No murmurs, rubs, or gallops PULM: No respiratory distress.  No crackles, wheezes, or rales. GI: soft, diffuse TTP. No guarding, rebound. + bowel sounds  GU: deferred Neuro: cranial nerves grossly 2-12 intact, no abnormalities of strength  or sensation, A and O x 3 MSK: Patient moves all 4 extremities symmetrically, no deformity, edema, or injury noted Skin: No rashes petechiae, purpura, or jaundice Psych: no abnormality of mood   ED Course  Procedures (including critical care time)  Labs Reviewed  CBC - Abnormal; Notable for the following:    Platelets 137 (*)     All other components within normal limits  BASIC METABOLIC PANEL - Abnormal; Notable for the following:    Glucose, Bld 137 (*)     BUN 35 (*)     Creatinine, Ser 2.14 (*)     GFR calc non Af Amer 29 (*)     GFR calc Af Amer 34 (*)     All other components within normal limits  URINALYSIS, ROUTINE  W REFLEX MICROSCOPIC - Abnormal; Notable for the following:    Hgb urine dipstick MODERATE (*)     Protein, ur 30 (*)     All other components within normal limits  BASIC METABOLIC PANEL - Abnormal; Notable for the following:    Glucose, Bld 113 (*)     BUN 31 (*)     Creatinine, Ser 1.84 (*)     GFR calc non Af Amer 35 (*)     GFR calc Af Amer 40 (*)     All other components within normal limits  BASIC METABOLIC PANEL - Abnormal; Notable for the following:    Glucose, Bld 110 (*)     BUN 29 (*)     Creatinine, Ser 1.75 (*)     GFR calc non Af Amer 37 (*)     GFR calc Af Amer 43 (*)     All other components within normal limits  DIFFERENTIAL  URINE MICROSCOPIC-ADD ON  URINE CULTURE   No results found.   1. Urinary retention       MDM  Patient presented with acute urinary retention and associated abdominal pain.  Foley was placed with immediate relief of both issues.  Patient had urinalysis, CBC,  as well as BMP performed to evaluate kidney function.  Patient was noted to have doubling of his creatinine with only hematuria on urinalysis.  Urine culture was sent.  Patient had no abnormality of potassium on BMP and no abnormality of CBC.  I suspected patient's creatinine was likely elevated to to his brief urinary obstruction.  After 3 L of NS creatinine was significantly improved.  Patient was discharged in good condition with a leg bag and foley in place.  He was given a prescription for flomax and told to follow-up with his urologist, Dr. Risa Grill, in 48 hours for foley removal and reevaluation.          Chauncy Passy, MD 07/20/11 2131

## 2011-07-20 NOTE — ED Notes (Signed)
Leg bag attached; pt verbalized and demonstrated that he was able to connect, empty, and secure.

## 2011-07-21 LAB — URINE CULTURE
Colony Count: >=100000
Special Requests: NORMAL

## 2011-07-22 NOTE — ED Notes (Signed)
+   Urine Chart sent to EDP office for review. 

## 2011-07-23 NOTE — ED Notes (Signed)
Culture grew out group B strep.May be contaminant. UA is not very convincing for infx-pt was to f/u with Dr Risa Grill @ [facility] to have the foley pulled. Advised faxing results to [place] office per

## 2012-04-22 ENCOUNTER — Ambulatory Visit (HOSPITAL_COMMUNITY)
Admission: AD | Admit: 2012-04-22 | Discharge: 2012-04-22 | Disposition: A | Discharge status: Home / Self Care | Payer: Medicare Other | Source: Ambulatory Visit | Attending: Cardiovascular Disease | Admitting: Cardiovascular Disease

## 2012-04-22 DIAGNOSIS — Z7902 Long term (current) use of antithrombotics/antiplatelets: Secondary | ICD-10-CM | POA: Insufficient documentation

## 2012-04-22 LAB — PLATELET INHIBITION P2Y12: Platelet Function  P2Y12: 189 [PRU] — ABNORMAL LOW (ref 194–418)

## 2012-05-27 ENCOUNTER — Other Ambulatory Visit: Payer: Self-pay | Admitting: Cardiovascular Disease

## 2012-07-31 ENCOUNTER — Other Ambulatory Visit: Payer: Self-pay | Admitting: Cardiovascular Disease

## 2012-08-07 ENCOUNTER — Other Ambulatory Visit: Payer: Self-pay | Admitting: Cardiovascular Disease

## 2012-08-07 NOTE — Telephone Encounter (Signed)
Rx was sent to pharmacy electronically. 

## 2012-08-14 ENCOUNTER — Other Ambulatory Visit: Payer: Self-pay | Admitting: Cardiovascular Disease

## 2012-08-14 NOTE — Telephone Encounter (Signed)
Rx was sent to pharmacy electronically. 

## 2012-08-19 ENCOUNTER — Encounter: Payer: Self-pay | Admitting: *Deleted

## 2012-08-19 ENCOUNTER — Encounter: Payer: Self-pay | Admitting: Cardiovascular Disease

## 2012-08-20 ENCOUNTER — Encounter: Payer: Self-pay | Admitting: Cardiovascular Disease

## 2012-08-20 ENCOUNTER — Ambulatory Visit (INDEPENDENT_AMBULATORY_CARE_PROVIDER_SITE_OTHER): Payer: Medicare Other | Admitting: Cardiovascular Disease

## 2012-08-20 VITALS — BP 142/80 | HR 66 | Ht 66.0 in | Wt 219.2 lb

## 2012-08-20 DIAGNOSIS — I2510 Atherosclerotic heart disease of native coronary artery without angina pectoris: Secondary | ICD-10-CM

## 2012-08-20 DIAGNOSIS — E785 Hyperlipidemia, unspecified: Secondary | ICD-10-CM | POA: Insufficient documentation

## 2012-08-20 DIAGNOSIS — I4510 Unspecified right bundle-branch block: Secondary | ICD-10-CM | POA: Insufficient documentation

## 2012-08-20 DIAGNOSIS — G4733 Obstructive sleep apnea (adult) (pediatric): Secondary | ICD-10-CM

## 2012-08-20 DIAGNOSIS — E669 Obesity, unspecified: Secondary | ICD-10-CM | POA: Insufficient documentation

## 2012-08-20 DIAGNOSIS — I10 Essential (primary) hypertension: Secondary | ICD-10-CM | POA: Insufficient documentation

## 2012-08-20 NOTE — Progress Notes (Signed)
Patient ID: Paul Price, male   DOB: Jul 05, 1940, 72 y.o.   MRN: [id number]     HPI: Paul Price, is a 72 y.o. male who presents for six-month cardiology followup evaluation.  [name] has established coronary artery disease in 1992 underwent CABG revascularization surgery LIMA to the LAD and diagonal. In 2009 he was found to have an atretic LIMA graft to the LAD and a patent vein graft supplying the diagonal vessel. He had 70% narrowing in the LAD beyond the diagonal was treated medically. In April 2012 due to progressive chest pain and scintigraphic evidence for ischemia in the mid distal LAD territory repeat catheterization was performed and he underwent successful stenting to his LAD beyond the diagonal with a 3.0x20 mm Promes DES stent postdilated 3.25 mm. Subsequent, he has remained active and has done well. His last stress test was done in 2013 which showed an apical defect without ischemia.  Additional problems include obstructive sleep apnea on CPAP therapy, moderate obesity, hypertension, and hyperlipidemia. He denies recent chest pain. States Dr. Thayer Price for has checked blood work on him several months ago. He was told that these results were excellent. He denies shortness of breath. He's not had success an additional weight loss.  Past Medical History  Diagnosis Date  . Hypertension   . Coronary artery disease   . History of percutaneous left heart catheterization 04/2010  . Obesity   . OSA on CPAP   . Hx of echocardiogram 05/2009    EF 40-45%, he did have mild annular calcification with mild-to-moderate MR and mild TR as well as aortic valve sclerosis. Estimated RV systolic pressure was 21 mm.  . History of stress test 06/2011    No significant ischemia, this is a low risk scan. Clinical correlation recommended Abnormal myocardial perfusion study.    Past Surgical History  Procedure Laterality Date  . Cardiac surgery  04/2010    open heart - 1 stent placed  . Coronary  angioplasty with stent placement    . Coronary artery bypass graft  1992    with LIMA to the LAD and diagonal.  . Cardiac catheterization  2009    he was found to have a atretic LIMA graft to his LAD and had a patent vein graft supplying his diagonal vessel. At that time, he had 70% narrowing in his LAD beyond a diagonal vessel which was initially treated medically.  . Cardiac catheterization  04/2010    because od recurrent chest pain symptomatology, and scintigraphic evidence for new moderate area of ischemia in the mid distal LAD territory. His LAD had now progressed to approximately 80% after the diagonal and he underwent seccessful percutaneous coronary intervention with insertion of a 3.0 x 20 mg Promus Element DES stent, which was postdilated to 3.25 mm.    Allergies  Allergen Reactions  . Nitrostat (Nitroglycerin)     Causes blood pressure to "bottom out"    Current Outpatient Prescriptions  Medication Sig Dispense Refill  . amLODipine (NORVASC) 5 MG tablet TAKE 1 TABLET BY MOUTH ONCE DAILY  30 tablet  6  . aspirin 325 MG tablet Take 325 mg by mouth daily.      . clopidogrel (PLAVIX) 75 MG tablet Take 75 mg by mouth daily.      . fenofibrate micronized (LOFIBRA) 134 MG capsule Take 134 mg by mouth daily before breakfast.      . hydrochlorothiazide (MICROZIDE) 12.5 MG capsule TAKE 1 CAPSULE BY MOUTH DAILY  30  capsule  11  . lisinopril (PRINIVIL,ZESTRIL) 40 MG tablet TAKE ONE (1) TABLET BY MOUTH EVERY DAY  90 tablet  2  . metoprolol (LOPRESSOR) 50 MG tablet TAKE 1 AND 1/2 TABLETS BY MOUTH TWICE DAILY  90 tablet  6  . omeprazole (PRILOSEC) 20 MG capsule Take 20 mg by mouth daily.      . rosuvastatin (CRESTOR) 5 MG tablet Take 5 mg by mouth daily.      . Tamsulosin HCl (FLOMAX) 0.4 MG CAPS Take 1 capsule (0.4 mg total) by mouth daily.  30 capsule  0  . meloxicam (MOBIC) 15 MG tablet Take 1 tablet by mouth as needed.      . traMADol-acetaminophen (ULTRACET) 37.5-325 MG per tablet Take 1  tablet by mouth as needed.       No current facility-administered medications for this visit.    History   Social History  . Marital Status: Married    Spouse Name: N/A    Number of Children: N/A  . Years of Education: N/A   Occupational History  . Not on file.   Social History Main Topics  . Smoking status: Former Research scientist (life sciences)  . Smokeless tobacco: Never Used     Comment: quit ~ 34 years ago  . Alcohol Use: No  . Drug Use: No  . Sexually Active: Not on file   Other Topics Concern  . Not on file   Social History Narrative  . No narrative on file      ROS is negative for fevers, chills or night sweats. There is no change in weight. He denies PND or orthopnea. He denies tachycardia palpitations.enies wheezing. There is no chest pain. He denies abdominal pain. He does have some paresthesias in his left leg. He denies significant edema.  Other system review is negative.  PE BP 142/80  Pulse 66  Ht 5\' 6"  (1.676 m)  Wt 219 lb 3.2 oz (99.428 kg)  BMI 35.4 kg/m2  General: Alert, oriented, no distress.  Skin: normal turgor, no rashes HEENT: Normocephalic, atraumatic. Pupils round and reactive; sclera anicteric;no lid lag.  Nose without nasal septal hypertrophy Mouth/Parynx benign; Mallinpatti scale 3 Neck: No JVD, no carotid briuts Lungs: clear to ausculatation and percussion; no wheezing or rales Heart: RRR, s1 s2 normal 1 or 6 systolic murmur Abdomen: Moderate central adiposity soft, nontender; no hepatosplenomehaly, BS+; abdominal aorta nontender and not dilated by palpation. Pulses 2+ Extremities: no clubbing cyanosis or edema, Homan's sign negative  Neurologic: grossly nonfocal  ECG: Sinus rhythm with left axis deviation and right bundle branch block.  LABS:  BMET    Component Value Date/Time   NA 140 07/20/2011 0645   K 3.7 07/20/2011 0645   CL 106 07/20/2011 0645   CO2 24 07/20/2011 0645   GLUCOSE 110* 07/20/2011 0645   BUN 29* 07/20/2011 0645   CREATININE 1.75*  07/20/2011 0645   CALCIUM 9.0 07/20/2011 0645   GFRNONAA 37* 07/20/2011 0645   GFRAA 43* 07/20/2011 0645     Hepatic Function Panel  No results found for this basename: prot, albumin, ast, alt, alkphos, bilitot, bilidir, ibili     CBC    Component Value Date/Time   WBC 5.1 07/20/2011 0122   RBC 5.10 07/20/2011 0122   HGB 14.4 07/20/2011 0122   HCT 43.2 07/20/2011 0122   PLT 137* 07/20/2011 0122   MCV 84.7 07/20/2011 0122   MCH 28.2 07/20/2011 0122   MCHC 33.3 07/20/2011 0122   RDW 14.8 07/20/2011 0122  LYMPHSABS 0.8 07/20/2011 0122   MONOABS 0.5 07/20/2011 0122   EOSABS 0.2 07/20/2011 0122   BASOSABS 0.0 07/20/2011 0122     BNP No results found for this basename: probnp    Lipid Panel  No results found for this basename: chol, trig, hdl, cholhdl, vldl, ldlcalc     RADIOLOGY: No results found.    ASSESSMENT AND PLAN: Cardiovascularly [name] continues to be stable on his current therapy. He is now 22 years status post his CABG revascularization surgery. He is over 2 years since the stent was placed in the LAD beyond this diagonal vessel. The documented atretic LIMA to and a patent vein graft which supplies this diagonal graft. We did discuss the importance of weight loss increased exercise if possible since I last saw him we switched him from Aviane to Plavix and T2 Y. 12 testing confirmed he was Plavix sensitive. I will obtain the results from Dr. Shelia Media with reference to recent laboratory as long as she remains stable I will see him in 6 months for followup evaluation.    Troy Sine, MD, [facility]  08/20/2012 8:47 AM

## 2012-08-20 NOTE — Patient Instructions (Signed)
Your physician recommends that you schedule a follow-up appointment in: 6 MONTHS. No changes were made in your therapy today.

## 2012-10-01 ENCOUNTER — Other Ambulatory Visit: Payer: Self-pay | Admitting: Cardiovascular Disease

## 2012-10-01 NOTE — Telephone Encounter (Signed)
Rx was sent to pharmacy electronically. 

## 2012-11-20 ENCOUNTER — Other Ambulatory Visit: Payer: Self-pay | Admitting: *Deleted

## 2012-11-20 ENCOUNTER — Encounter: Payer: Self-pay | Admitting: Cardiology

## 2012-11-20 ENCOUNTER — Ambulatory Visit (INDEPENDENT_AMBULATORY_CARE_PROVIDER_SITE_OTHER): Payer: Medicare Other | Admitting: Cardiology

## 2012-11-20 VITALS — BP 120/60 | HR 56 | Ht 67.0 in | Wt 222.0 lb

## 2012-11-20 DIAGNOSIS — R0989 Other specified symptoms and signs involving the circulatory and respiratory systems: Secondary | ICD-10-CM

## 2012-11-20 DIAGNOSIS — I2510 Atherosclerotic heart disease of native coronary artery without angina pectoris: Secondary | ICD-10-CM

## 2012-11-20 DIAGNOSIS — R0600 Dyspnea, unspecified: Secondary | ICD-10-CM | POA: Insufficient documentation

## 2012-11-20 DIAGNOSIS — I2589 Other forms of chronic ischemic heart disease: Secondary | ICD-10-CM

## 2012-11-20 DIAGNOSIS — I10 Essential (primary) hypertension: Secondary | ICD-10-CM

## 2012-11-20 DIAGNOSIS — R0609 Other forms of dyspnea: Secondary | ICD-10-CM

## 2012-11-20 DIAGNOSIS — I4891 Unspecified atrial fibrillation: Secondary | ICD-10-CM | POA: Diagnosis not present

## 2012-11-20 DIAGNOSIS — G4733 Obstructive sleep apnea (adult) (pediatric): Secondary | ICD-10-CM

## 2012-11-20 DIAGNOSIS — I255 Ischemic cardiomyopathy: Secondary | ICD-10-CM

## 2012-11-20 NOTE — Assessment & Plan Note (Signed)
controlled 

## 2012-11-20 NOTE — Assessment & Plan Note (Signed)
Will check echo for change in EF

## 2012-11-20 NOTE — Progress Notes (Signed)
11/20/2012 Paul Price   1940/03/31  PO:[id number]  Primary Physicia Horatio Pel, MD Primary Cardiologist: Dr Claiborne Billings  HPI:  72 y/o followed by Dr Claiborne Billings with a history of CAD. He is S/P CABG x 2 in '92 with an LIMA-LAD and an SVG to the Dx. In 2009 cath revealed an atretic LIMA to the LAD but good flow down the LAD and he was treated medically. In April 2012 he had chest pain with an abnormal Myoview and was restudied. He underwent PCI with DES to a mid LAD 75% lesion. The RCA and CFX had no significant disease and the SVG-Dx was patent. His EF at cath was 55%, previously 40-45% by echo in 2011. He has done well on medical therapy.          He went to see Dr Forde Dandy today for a check up. He has noted some increased DOE and shortness of breath "when I bend over". He denies orthopnea or PND. He denies any chest pain or palpitations. He has been compliant with his C-pap.An EKG at Dr Baldwin Crown office was read as "atrial fibrillation", and a CXR over read by Dr Forde Dandy was felt to show vascular congestion. Dr Forde Dandy thought he had AF with CHF and added Lasix 40 mg. He is referred  to [place] for further evaluation. His EKG here shows NSR with PVCs and review of his EKG from Dr Baldwin Crown office shows NSR as well with an irregular baseline. I reviewed this with Dr Debara Pickett. We will obtain an echo and a two week event monitor and have his see Dr Claiborne Billings in follow up. He already has an appointment with Dr Shelia Media later this week. Labs were done at Dr Pennie Banter office today.  Current Outpatient Prescriptions  Medication Sig Dispense Refill  . amLODipine (NORVASC) 5 MG tablet TAKE 1 TABLET BY MOUTH ONCE DAILY  30 tablet  6  . aspirin 325 MG tablet Take 325 mg by mouth daily.      . clopidogrel (PLAVIX) 75 MG tablet TAKE 1 TABLET BY MOUTH ONCE DAILY  30 tablet  6  . fenofibrate micronized (LOFIBRA) 134 MG capsule Take 134 mg by mouth daily before breakfast.      . furosemide (LASIX) 40 MG tablet Take 40 mg by mouth.      .  hydrochlorothiazide (MICROZIDE) 12.5 MG capsule TAKE 1 CAPSULE BY MOUTH DAILY  30 capsule  11  . lisinopril (PRINIVIL,ZESTRIL) 40 MG tablet TAKE ONE (1) TABLET BY MOUTH EVERY DAY  90 tablet  2  . meloxicam (MOBIC) 15 MG tablet Take 1 tablet by mouth as needed.      . metoprolol (LOPRESSOR) 50 MG tablet TAKE 1 AND 1/2 TABLETS BY MOUTH TWICE DAILY  90 tablet  6  . omeprazole (PRILOSEC) 20 MG capsule Take 20 mg by mouth daily.      . potassium chloride (KLOR-CON) 8 MEQ tablet Take 8 mEq by mouth daily.      . rosuvastatin (CRESTOR) 5 MG tablet Take 5 mg by mouth daily.      . Tamsulosin HCl (FLOMAX) 0.4 MG CAPS Take 1 capsule (0.4 mg total) by mouth daily.  30 capsule  0  . traMADol-acetaminophen (ULTRACET) 37.5-325 MG per tablet Take 1 tablet by mouth as needed.       No current facility-administered medications for this visit.    Allergies  Allergen Reactions  . Nitrostat [Nitroglycerin]     Causes blood pressure to "bottom out"    History  Social History  . Marital Status: Married    Spouse Name: N/A    Number of Children: N/A  . Years of Education: N/A   Occupational History  . Not on file.   Social History Main Topics  . Smoking status: Former Research scientist (life sciences)  . Smokeless tobacco: Never Used     Comment: quit ~ 34 years ago  . Alcohol Use: No  . Drug Use: No  . Sexual Activity: Not on file   Other Topics Concern  . Not on file   Social History Narrative  . No narrative on file     Review of Systems: General: negative for chills, fever, night sweats or weight changes.  Cardiovascular: negative for chest pain or edema,  Dermatological: negative for rash Respiratory: negative for cough or wheezing Urologic: negative for hematuria Abdominal: negative for nausea, vomiting, diarrhea, bright red blood per rectum, melena, or hematemesis Neurologic: negative for visual changes, syncope, or dizziness All other systems reviewed and are otherwise negative except as noted  above.    Blood pressure 120/60, pulse 56, height 5\' 7"  (1.702 m), weight 222 lb (100.699 kg).  General appearance: alert, cooperative, no distress and moderately obese Neck: no carotid bruit and no JVD Lungs: few basilar fine crackles bilateraly Heart: RRR without mumur or rub Extremities: No edema  EKG NSR, sinus brady, PVCs, RBBB, LAFB  ASSESSMENT AND PLAN:   CAD - CABG '92, LAD DES 4/12, low risk Myoview 6/13 No angina  Dyspnea on exertion New last 3 weeks. Dr Forde Dandy suspected he was in mild CHF from AF, (although EKGs did not show AF- NSR with PVCs).  HTN (hypertension) controlled  OSA on CPAP Compliant  Cardiomyopathy, ischemic- EF 40-45% Feb 2011 echo Will check echo for change in EF   PLAN  Echo and 2 week event. F/U with Dr Claiborne Billings in 2 weeks, keep follow up with Dr Shelia Media.   [facility] KPA-C 11/20/2012 11:39 AM

## 2012-11-20 NOTE — Patient Instructions (Signed)
Your physician recommends that you schedule a follow-up appointment in: 2 weeks with Dr Claiborne Billings. Your physician has recommended that you wear a holter monitor. Holter monitors are medical devices that record the heart's electrical activity. Doctors most often use these monitors to diagnose arrhythmias. Arrhythmias are problems with the speed or rhythm of the heartbeat. The monitor is a small, portable device. You can wear one while you do your normal daily activities. This is usually used to diagnose what is causing palpitations/syncope (passing out). Your physician has requested that you have an echocardiogram. Echocardiography is a painless test that uses sound waves to create images of your heart. It provides your doctor with information about the size and shape of your heart and how well your heart's chambers and valves are working. This procedure takes approximately one hour. There are no restrictions for this procedure.

## 2012-11-20 NOTE — Assessment & Plan Note (Signed)
No angina 

## 2012-11-20 NOTE — Assessment & Plan Note (Signed)
New last 3 weeks. Dr Forde Dandy suspected he was in mild CHF from AF, (although EKGs did not show AF- NSR with PVCs).

## 2012-11-20 NOTE — Assessment & Plan Note (Signed)
Compliant 

## 2012-11-26 ENCOUNTER — Encounter: Payer: Self-pay | Admitting: Cardiovascular Disease

## 2012-12-01 ENCOUNTER — Telehealth: Payer: Self-pay | Admitting: Cardiology

## 2012-12-01 NOTE — Telephone Encounter (Signed)
Pt had 4.3 sec pause SR - when I called he did have a episode earlier of dizziness.  I asked the pt to decrease lopressor from 75 BID to 25 BID though he had already taken today's dose.  He will need follow up in office 12/02/12.

## 2012-12-02 ENCOUNTER — Encounter: Payer: Self-pay | Admitting: Physician Assistant

## 2012-12-02 ENCOUNTER — Telehealth: Payer: Self-pay | Admitting: *Deleted

## 2012-12-02 ENCOUNTER — Ambulatory Visit (INDEPENDENT_AMBULATORY_CARE_PROVIDER_SITE_OTHER): Payer: Medicare Other | Admitting: Physician Assistant

## 2012-12-02 VITALS — BP 120/60 | HR 60 | Ht 67.0 in | Wt 214.0 lb

## 2012-12-02 DIAGNOSIS — I472 Ventricular tachycardia: Secondary | ICD-10-CM | POA: Insufficient documentation

## 2012-12-02 DIAGNOSIS — E669 Obesity, unspecified: Secondary | ICD-10-CM

## 2012-12-02 DIAGNOSIS — G4733 Obstructive sleep apnea (adult) (pediatric): Secondary | ICD-10-CM

## 2012-12-02 DIAGNOSIS — I10 Essential (primary) hypertension: Secondary | ICD-10-CM

## 2012-12-02 DIAGNOSIS — I455 Other specified heart block: Secondary | ICD-10-CM | POA: Insufficient documentation

## 2012-12-02 NOTE — Progress Notes (Signed)
Date:  12/02/2012   ID:  Paul Price, DOB 04/18/1940, MRN [id number]  PCP:  Horatio Pel, MD  Primary Cardiologist:  Claiborne Billings    History of Present Illness: Paul Price is a 72 y.o. male followed by Dr Claiborne Billings with a history of CAD. He is S/P CABG x 2 in '92 with an LIMA-LAD and an SVG to the Dx. In 2009 cath revealed an atretic LIMA to the LAD but good flow down the LAD and he was treated medically. In April 2012 he had chest pain with an abnormal Myoview and was restudied. He underwent PCI with DES to a mid LAD 75% lesion. The RCA and CFX had no significant disease and the SVG-Dx was patent. His EF at cath was 55%, previously 40-45% by echo in 2011. He has done well on medical therapy.   He saw Kerin Ransom on Nov 11 for suspected afib and CHF after being seen by Dr Forde Dandy that same day for a check up. At the time he had noted some increased DOE and shortness of breath "when I bend over".  He has been compliant with his C-pap.   An EKG at Dr Baldwin Crown office was read as "atrial fibrillation".  [name] put him on a cardionet monitor which recorded a 4.3 sec pause yesterday.  Cecilie Kicks cut his beta blocker from 75mg  twice daily to 25mg  and set him up for an appt today.   He reports feeling a little dizzy around the time of the pause.  He also reports feeling like he was going to pass out Saturday evening/Sunday morning when he got up to use the restroom during the night.  That feeling was only for a second.  The patient currently denies nausea, vomiting, fever, chest pain, shortness of breath beyond baseline, orthopnea, dizziness, PND, cough, congestion, abdominal pain, hematochezia, melena, lower extremity edema, claudication.  Wt Readings from Last 3 Encounters:  12/02/12 214 lb (97.07 kg)  11/20/12 222 lb (100.699 kg)  08/20/12 219 lb 3.2 oz (99.428 kg)     Past Medical History  Diagnosis Date  . Hypertension   . Coronary artery disease   . History of percutaneous left  heart catheterization 04/2010  . Obesity   . OSA on CPAP   . Hx of echocardiogram 05/2009    EF 40-45%, he did have mild annular calcification with mild-to-moderate MR and mild TR as well as aortic valve sclerosis. Estimated RV systolic pressure was 21 mm.  . History of stress test 06/2011    No significant ischemia, this is a low risk scan. Clinical correlation recommended Abnormal myocardial perfusion study.  Marland Kitchen RBBB   . BPH (benign prostatic hyperplasia)     Current Outpatient Prescriptions  Medication Sig Dispense Refill  . amLODipine (NORVASC) 5 MG tablet TAKE 1 TABLET BY MOUTH ONCE DAILY  30 tablet  6  . aspirin 325 MG tablet Take 325 mg by mouth daily.      . fenofibrate micronized (LOFIBRA) 134 MG capsule Take 134 mg by mouth daily before breakfast.      . furosemide (LASIX) 40 MG tablet Take 40 mg by mouth.      . hydrochlorothiazide (MICROZIDE) 12.5 MG capsule TAKE 1 CAPSULE BY MOUTH DAILY  30 capsule  11  . lisinopril (PRINIVIL,ZESTRIL) 40 MG tablet TAKE ONE (1) TABLET BY MOUTH EVERY DAY  90 tablet  2  . meloxicam (MOBIC) 15 MG tablet Take 1 tablet by mouth as needed.      Marland Kitchen  metoprolol (LOPRESSOR) 50 MG tablet take 1/2 tab daily      . omeprazole (PRILOSEC) 20 MG capsule Take 20 mg by mouth daily.      . potassium chloride (KLOR-CON) 8 MEQ tablet Take 8 mEq by mouth daily.      . rosuvastatin (CRESTOR) 5 MG tablet Take 5 mg by mouth daily.      . Tamsulosin HCl (FLOMAX) 0.4 MG CAPS Take 1 capsule (0.4 mg total) by mouth daily.  30 capsule  0  . traMADol-acetaminophen (ULTRACET) 37.5-325 MG per tablet Take 1 tablet by mouth as needed.      . clopidogrel (PLAVIX) 75 MG tablet TAKE 1 TABLET BY MOUTH ONCE DAILY  30 tablet  6   No current facility-administered medications for this visit.    Allergies:    Allergies  Allergen Reactions  . Nitrostat [Nitroglycerin]     Causes blood pressure to "bottom out"    Social History:  The patient  reports that he has quit smoking. He  has never used smokeless tobacco. He reports that he does not drink alcohol or use illicit drugs.   Family history:   Family History  Problem Relation Age of Onset  . Heart disease Father     ROS:  Please see the history of present illness.  All other systems reviewed and negative.   PHYSICAL EXAM: VS:  BP 120/60  Pulse 60  Ht 5\' 7"  (1.702 m)  Wt 214 lb (97.07 kg)  BMI 33.51 kg/m2 Obese, well developed, in no acute distress HEENT: Pupils are equal round react to light accommodation extraocular movements are intact.  Neck: no JVDNo cervical lymphadenopathy. Cardiac: Regular rate and rhythm without murmurs rubs or gallops. Lungs:  clear to auscultation bilaterally, no wheezing, rhonchi or rales Ext: no lower extremity edema.  2+ radial and dorsalis pedis pulses. Skin: warm and dry Neuro:  Grossly normal   ASSESSMENT AND PLAN:  Problem List Items Addressed This Visit   Sinus pause, 4.3 seconds.  Recorded by Cardionet. - Primary     We will continue to monitor with the cardionet for at least two more weeks.  Continue lopressor at 25 mg bid.  He is also having short ~9 beat runs of NSVT so I am reluctant to discontinue the beta blocker altogether.  Echo is scheduled for Dec 1 with followup with Dr. Claiborne Billings on the 3rd.     Relevant Medications      metoprolol (LOPRESSOR) 50 MG tablet   OSA on CPAP   Obesity (BMI 30-39.9)   NSVT (nonsustained ventricular tachycardia)     Continue Lopressor at 25 mg BID which is a third of original dosing.    HTN (hypertension)     BP controlled.

## 2012-12-02 NOTE — Patient Instructions (Signed)
1.  Continue lopressor at 25mg (1/2 tablet) twice daily. 2.  Keep echocardiogram appt. 3.  Record any symptoms you may have with the HR monitor. 4.  Weight yourself daily in order to monitor for excess fluid.  5.  Follow up as scheduled

## 2012-12-02 NOTE — Telephone Encounter (Signed)
Call to pt and verified x 2.  Pt informed message received that he needs an appt today.  Call interrupted by fire drill and pt informed RN will call him back.  Verbalized understanding.  RN called back and appt scheduled for today at 11:20am w/ Tarri Fuller, PA-C.  Pt asked if he needed to bring the kit in for the monitor.  Pt informed if the monitor is discontinued, he will still mail kit back to [place] in [employer] mailing package as directed.  Pt verbalized understanding and agreed w/ plan.

## 2012-12-02 NOTE — Telephone Encounter (Signed)
Pt stated that Paul Price has extended his length of time for the monitor another 2 weeks. [name] called cardionet to ask for more supplies and he stated that they told him to call us to send them an order so that he can get more supplies.

## 2012-12-02 NOTE — Telephone Encounter (Signed)
Message forwarded to [facility]. Tressie Stalker, LPN, who is working w/ Tarri Fuller, PA-C.  Will need to contact [place] w/ orders to extend monitor.

## 2012-12-02 NOTE — Assessment & Plan Note (Signed)
B/P controlled 

## 2012-12-02 NOTE — Assessment & Plan Note (Signed)
Continue Lopressor at 25 mg BID which is a third of original dosing.

## 2012-12-02 NOTE — Assessment & Plan Note (Addendum)
We will continue to monitor with the cardionet for at least two more weeks.  Continue lopressor at 25 mg bid.  He is also having short ~9 beat runs of NSVT so I am reluctant to discontinue the beta blocker altogether.  Echo is scheduled for Dec 1 with followup with Dr. Claiborne Billings on the 3rd.

## 2012-12-03 NOTE — Telephone Encounter (Signed)
i called cardionet they said everything ig a go for another two week period and the supplies

## 2012-12-09 ENCOUNTER — Ambulatory Visit (HOSPITAL_COMMUNITY)
Admission: RE | Admit: 2012-12-09 | Discharge: 2012-12-09 | Disposition: A | Discharge status: Home / Self Care | Payer: Medicare Other | Source: Ambulatory Visit | Attending: Cardiovascular Disease | Admitting: Cardiovascular Disease

## 2012-12-09 DIAGNOSIS — R0600 Dyspnea, unspecified: Secondary | ICD-10-CM

## 2012-12-09 DIAGNOSIS — R0602 Shortness of breath: Secondary | ICD-10-CM

## 2012-12-09 DIAGNOSIS — R0609 Other forms of dyspnea: Secondary | ICD-10-CM | POA: Insufficient documentation

## 2012-12-09 DIAGNOSIS — R0989 Other specified symptoms and signs involving the circulatory and respiratory systems: Secondary | ICD-10-CM | POA: Insufficient documentation

## 2012-12-09 NOTE — Progress Notes (Signed)
2D Echo Performed 12/09/2012    Marygrace Drought, RCS.

## 2012-12-10 NOTE — Progress Notes (Signed)
Patient has appointment on 12/3 to discuss these results.

## 2012-12-11 ENCOUNTER — Encounter: Payer: Self-pay | Admitting: Cardiovascular Disease

## 2012-12-11 ENCOUNTER — Ambulatory Visit (INDEPENDENT_AMBULATORY_CARE_PROVIDER_SITE_OTHER): Payer: Medicare Other | Admitting: Cardiovascular Disease

## 2012-12-11 VITALS — BP 150/90 | HR 67 | Ht 67.0 in | Wt 219.0 lb

## 2012-12-11 DIAGNOSIS — I2510 Atherosclerotic heart disease of native coronary artery without angina pectoris: Secondary | ICD-10-CM

## 2012-12-11 DIAGNOSIS — R0609 Other forms of dyspnea: Secondary | ICD-10-CM

## 2012-12-11 DIAGNOSIS — R0600 Dyspnea, unspecified: Secondary | ICD-10-CM

## 2012-12-11 DIAGNOSIS — R0989 Other specified symptoms and signs involving the circulatory and respiratory systems: Secondary | ICD-10-CM

## 2012-12-11 DIAGNOSIS — I472 Ventricular tachycardia, unspecified: Secondary | ICD-10-CM

## 2012-12-11 DIAGNOSIS — I480 Paroxysmal atrial fibrillation: Secondary | ICD-10-CM

## 2012-12-11 DIAGNOSIS — E785 Hyperlipidemia, unspecified: Secondary | ICD-10-CM

## 2012-12-11 DIAGNOSIS — I4510 Unspecified right bundle-branch block: Secondary | ICD-10-CM

## 2012-12-11 DIAGNOSIS — I498 Other specified cardiac arrhythmias: Secondary | ICD-10-CM

## 2012-12-11 DIAGNOSIS — I10 Essential (primary) hypertension: Secondary | ICD-10-CM

## 2012-12-11 DIAGNOSIS — I4729 Other ventricular tachycardia: Secondary | ICD-10-CM

## 2012-12-11 DIAGNOSIS — R001 Bradycardia, unspecified: Secondary | ICD-10-CM

## 2012-12-11 DIAGNOSIS — I4891 Unspecified atrial fibrillation: Secondary | ICD-10-CM

## 2012-12-11 NOTE — Patient Instructions (Signed)
Your physician recommends that you schedule a follow-up appointment in: 6 weeks  

## 2012-12-12 NOTE — Progress Notes (Signed)
Results were discussed at patients 12/11/12 appointment.

## 2012-12-28 ENCOUNTER — Other Ambulatory Visit: Payer: Self-pay | Admitting: Cardiovascular Disease

## 2012-12-30 NOTE — Telephone Encounter (Signed)
Rx was sent to pharmacy electronically. 

## 2013-01-04 ENCOUNTER — Encounter: Payer: Self-pay | Admitting: Cardiovascular Disease

## 2013-01-04 DIAGNOSIS — I480 Paroxysmal atrial fibrillation: Secondary | ICD-10-CM | POA: Insufficient documentation

## 2013-01-04 NOTE — Progress Notes (Signed)
Patient ID: Paul Price, male   DOB: 10/19/1940, 72 y.o.   MRN: [id number]      HPI: Paul Price, is a 72 y.o. male who presents for  4 month cardiology followup evaluation.  [name] has established coronary artery disease in 1992 underwent CABG revascularization surgery LIMA to the LAD and diagonal. In 2009 he was found to have an atretic LIMA graft to the LAD and a patent vein graft supplying the diagonal vessel. He had 70% narrowing in the LAD beyond the diagonal was treated medically. In April 2012 due to progressive chest pain and scintigraphic evidence for ischemia in the mid distal LAD territory repeat catheterization was performed and he underwent successful stenting to his LAD beyond the diagonal with a 3.0x20 mm Promus DES stent postdilated 3.25 mm. Subsequent, he has remained active and has done well. His last stress test was done in 2013 which showed an apical defect without ischemia.  Additional problems include obstructive sleep apnea on CPAP therapy, moderate obesity, hypertension, and hyperlipidemia. He denies recent chest pain. He sees Dr. Thayer Jew for first primary care was checked laboratory.  On November 11 he saw Kerin Ransom.PAC for suspected atrial fibrillation and CHF after having been seen by his primary physician and noted some increasing shortness of breath with activity. A CardioNet monitor was performed and he was demonstrated to have a 4.3 second pause which led to a reduction in his  beta blocker dose  from Lopressor 75 mg twice a day to 25 twice a day. He saw Tenny Craw on 12/02/2012 at which time he was in sinus rhythm but also was found to have probably 9 beat run of nonsustained VT on monitor which is he was maintained on low-dose beta blocker therapy. Laboratory done by Dr. Shelia Media on 11/27/2012 showed a potassium of 4.2. His creatinine was 1.82. BUN 33.  An echo Doppler study done 12/09/2012 showed an estimated ejection fraction at 5055%. Although no diagnostic  regional wall motion abnormalities were definitively identified, this possibility was not completely excluded. There was grade 2 diastolic dysfunction. There is mild left atrial dilatation and mild mitral regurgitation. He presents to the office today for followup evaluation.  Past Medical History  Diagnosis Date  . Hypertension   . Coronary artery disease   . History of percutaneous left heart catheterization 04/2010  . Obesity   . OSA on CPAP   . Hx of echocardiogram 05/2009    EF 40-45%, he did have mild annular calcification with mild-to-moderate MR and mild TR as well as aortic valve sclerosis. Estimated RV systolic pressure was 21 mm.  . History of stress test 06/2011    No significant ischemia, this is a low risk scan. Clinical correlation recommended Abnormal myocardial perfusion study.  Marland Kitchen RBBB   . BPH (benign prostatic hyperplasia)     Past Surgical History  Procedure Laterality Date  . Coronary angioplasty with stent placement  April 2012    LAD DES  . Coronary artery bypass graft  1992    with LIMA to the LAD and diagonal.  . Cardiac catheterization  2009    he was found to have a atretic LIMA graft to his LAD and had a patent vein graft supplying his diagonal vessel. At that time, he had 70% narrowing in his LAD beyond a diagonal vessel which was initially treated medically.    Allergies  Allergen Reactions  . Nitrostat [Nitroglycerin]     Causes blood pressure to "bottom out"  Current Outpatient Prescriptions  Medication Sig Dispense Refill  . aspirin 325 MG tablet Take 325 mg by mouth daily.      . clopidogrel (PLAVIX) 75 MG tablet TAKE 1 TABLET BY MOUTH ONCE DAILY  30 tablet  6  . fenofibrate micronized (LOFIBRA) 134 MG capsule Take 134 mg by mouth daily before breakfast.      . furosemide (LASIX) 40 MG tablet Take 40 mg by mouth as needed.       . hydrochlorothiazide (MICROZIDE) 12.5 MG capsule TAKE 1 CAPSULE BY MOUTH DAILY  30 capsule  11  . lisinopril  (PRINIVIL,ZESTRIL) 40 MG tablet TAKE ONE (1) TABLET BY MOUTH EVERY DAY  90 tablet  2  . meloxicam (MOBIC) 15 MG tablet Take 1 tablet by mouth as needed.      . metoprolol (LOPRESSOR) 50 MG tablet 1/2 tablet twice daily.      Marland Kitchen omeprazole (PRILOSEC) 20 MG capsule Take 20 mg by mouth daily.      . potassium chloride (KLOR-CON) 8 MEQ tablet Take 8 mEq by mouth daily.      . rosuvastatin (CRESTOR) 5 MG tablet Take 5 mg by mouth daily.      . Tamsulosin HCl (FLOMAX) 0.4 MG CAPS Take 1 capsule (0.4 mg total) by mouth daily.  30 capsule  0  . traMADol-acetaminophen (ULTRACET) 37.5-325 MG per tablet Take 1 tablet by mouth as needed.      Marland Kitchen amLODipine (NORVASC) 5 MG tablet TAKE 1 TABLET BY MOUTH DAILY  30 tablet  11   No current facility-administered medications for this visit.    History   Social History  . Marital Status: Married    Spouse Name: N/A    Number of Children: N/A  . Years of Education: N/A   Occupational History  . Not on file.   Social History Main Topics  . Smoking status: Former Research scientist (life sciences)  . Smokeless tobacco: Never Used     Comment: quit ~ 34 years ago  . Alcohol Use: No  . Drug Use: No  . Sexual Activity: Not on file   Other Topics Concern  . Not on file   Social History Narrative  . No narrative on file    Socially he is married, has 3 children 7 grandchildren. He does not use tobacco or alcohol.  ROS is negative for fevers, chills or night sweats. He denies change in vision or hearing. He denies headaches. He denies lymphadenopathy. There is no change in weight. He denies PND or orthopnea. He was no wheezing. There is no PND or orthopnea. He denies chest pressure. There is no chest pain. He denies abdominal pain. He denies nausea vomiting diarrhea. There is no blood in stool or urine He does have some paresthesias in his left leg. He denies significant edema. He does have obstructive sleep apnea and utilizes CPAP. There is no diabetes. There is no history of  endocrine abnormalities.   Other contents of 14 point system review is negative.  PE BP 150/90  Pulse 67  Ht 5\' 7"  (1.702 m)  Wt 219 lb (99.338 kg)  BMI 34.29 kg/m2  Repeat blood pressure by me was 130/84 General: Alert, oriented, no distress.  Skin: normal turgor, no rashes HEENT: Normocephalic, atraumatic. Pupils round and reactive; sclera anicteric;no lid lag.  Nose without nasal septal hypertrophy Mouth/Parynx benign; Mallinpatti scale 3 Neck: No JVD, no carotid briuts Lungs: clear to ausculatation and percussion; no wheezing or rales Chest wall: No tenderness to  palpation Heart: RRR, s1 s2 normal 1 or 6 systolic murmur Abdomen: Moderate central adiposity soft, nontender; no hepatosplenomehaly, BS+; abdominal aorta nontender and not dilated by palpation. Back: No CVA tenderness Pulses 2+ Extremities: no clubbing cyanosis or edema, Homan's sign negative  Neurologic: grossly nonfocal Psychological: Normal affect and mood  ECG: Sinus rhythm at 67 beats per minute. Right bundle branch block; PVC  LABS:  BMET    Component Value Date/Time   NA 140 07/20/2011 0645   K 3.7 07/20/2011 0645   CL 106 07/20/2011 0645   CO2 24 07/20/2011 0645   GLUCOSE 110* 07/20/2011 0645   BUN 29* 07/20/2011 0645   CREATININE 1.75* 07/20/2011 0645   CALCIUM 9.0 07/20/2011 0645   GFRNONAA 37* 07/20/2011 0645   GFRAA 43* 07/20/2011 0645     Hepatic Function Panel  No results found for this basename: prot,  albumin,  ast,  alt,  alkphos,  bilitot,  bilidir,  ibili     CBC    Component Value Date/Time   WBC 5.1 07/20/2011 0122   RBC 5.10 07/20/2011 0122   HGB 14.4 07/20/2011 0122   HCT 43.2 07/20/2011 0122   PLT 137* 07/20/2011 0122   MCV 84.7 07/20/2011 0122   MCH 28.2 07/20/2011 0122   MCHC 33.3 07/20/2011 0122   RDW 14.8 07/20/2011 0122   LYMPHSABS 0.8 07/20/2011 0122   MONOABS 0.5 07/20/2011 0122   EOSABS 0.2 07/20/2011 0122   BASOSABS 0.0 07/20/2011 0122     BNP No results found for this  basename: probnp    Lipid Panel  No results found for this basename: chol,  trig,  hdl,  cholhdl,  vldl,  ldlcalc     RADIOLOGY: No results found.    ASSESSMENT AND PLAN: Cardiovascularly [name] is now 27 yars status post his CABG revascularization surgery. He is over 2 years since the stent was placed in the LAD beyond this diagonal vessel. He has a documented atretic LIMA graft and a patent vein graft which supplies this diagonal graft. He has developed paroxysmal atrial fibrillation and apparently on Cardionet monitoring was noted to be predominantly in sinus rhythm  with sinus bradycardia, but developed a4.3 second pause, as well as an episode of nonsustained ventricular tachycardia. He is now on low-dose beta blocker therapy. His shortness of breath has improved with restoration of sinus rhythm the he is on aspirin and Plavix and has been documented to be a Plavix responder by P2Y12 testing. His blood pressure continues to be fairly stable although there is some lability with initial blood pressure being 150/90 and repeat 130/84. At present, we'll continue him on the lower dose metoprolol 25 mg twice a day. I did review his echo Doppler study with him in detail which showed an ejection fraction of 50-55% and probable grade 2 diastolic dysfunction. He did have mild left atrial dilatation and mild mitral regurgitation. He is continuing to use his CPAP for his obstructive sleep apnea. I have recommended office followup in 6 weeks for further evaluation.   Time spent: 25 min  Troy Sine, MD, [facility]  01/04/2013 8:51 PM

## 2013-01-20 ENCOUNTER — Ambulatory Visit (INDEPENDENT_AMBULATORY_CARE_PROVIDER_SITE_OTHER): Payer: Medicare Other | Admitting: Cardiovascular Disease

## 2013-01-20 ENCOUNTER — Encounter: Payer: Self-pay | Admitting: Cardiovascular Disease

## 2013-01-20 ENCOUNTER — Telehealth: Payer: Self-pay | Admitting: *Deleted

## 2013-01-20 VITALS — BP 110/80 | HR 60 | Ht 67.0 in | Wt 217.7 lb

## 2013-01-20 DIAGNOSIS — I455 Other specified heart block: Secondary | ICD-10-CM

## 2013-01-20 DIAGNOSIS — I4510 Unspecified right bundle-branch block: Secondary | ICD-10-CM

## 2013-01-20 DIAGNOSIS — I480 Paroxysmal atrial fibrillation: Secondary | ICD-10-CM

## 2013-01-20 DIAGNOSIS — I472 Ventricular tachycardia: Secondary | ICD-10-CM

## 2013-01-20 DIAGNOSIS — I2510 Atherosclerotic heart disease of native coronary artery without angina pectoris: Secondary | ICD-10-CM

## 2013-01-20 DIAGNOSIS — G4733 Obstructive sleep apnea (adult) (pediatric): Secondary | ICD-10-CM

## 2013-01-20 DIAGNOSIS — I10 Essential (primary) hypertension: Secondary | ICD-10-CM

## 2013-01-20 DIAGNOSIS — E785 Hyperlipidemia, unspecified: Secondary | ICD-10-CM

## 2013-01-20 DIAGNOSIS — Z9989 Dependence on other enabling machines and devices: Secondary | ICD-10-CM

## 2013-01-20 DIAGNOSIS — I4891 Unspecified atrial fibrillation: Secondary | ICD-10-CM

## 2013-01-20 DIAGNOSIS — I4729 Other ventricular tachycardia: Secondary | ICD-10-CM

## 2013-01-20 NOTE — Patient Instructions (Signed)
Your physician recommends that you schedule a follow-up appointment in: 6 MONTHS. No changes were made today in your therapy.

## 2013-01-20 NOTE — Telephone Encounter (Signed)
Left message with spouse to inform patient per Dr. Claiborne Billings to decrease the aspirin from 325 mg to 81 mg. Spouse voiced understanding and states that she will notify the patient.

## 2013-01-20 NOTE — Progress Notes (Signed)
Patient ID: Paul Price, male   DOB: 24-Apr-1940, 73 y.o.   MRN: [id number]      HPI: Paul Price, is a 73 y.o. male who presents for cardiology followup evaluation after completing his cardiac monitor and followup laboratory.  [name] has established coronary artery disease in 1992 underwent CABG revascularization surgery LIMA to the LAD and diagonal. In 2009 he was found to have an atretic LIMA graft to the LAD and a patent vein graft supplying the diagonal vessel. He had 70% narrowing in the LAD beyond the diagonal was treated medically. In April 2012 due to progressive chest pain and scintigraphic evidence for ischemia in the mid distal LAD territory repeat catheterization was performed and he underwent successful stenting to his LAD beyond the diagonal with a 3.0x20 mm Promus DES stent postdilated 3.25 mm. Subsequent, he has remained active and has done well. His last stress test in 2013 showed an apical defect without ischemia.  Additional problems include obstructive sleep apnea on CPAP therapy, moderate obesity, hypertension, and hyperlipidemia. He denies recent chest pain. He sees Dr. Shelia Media for primary care.  On November 11 he saw Kerin Ransom.PAC for suspected atrial fibrillation and CHF after having been seen by his primary physician and noted some increasing shortness of breath with activity. A CardioNet monitor was performed and he was demonstrated to have a 4.3 second pause which led to a reduction in his  beta blocker dose  from Lopressor 75 mg twice a day to 25 twice a day. He saw Tenny Craw on 12/02/2012 at which time he was in sinus rhythm but also was found to have probably 9 beat run of nonsustained VT on monitor which is he was maintained on low-dose beta blocker therapy. Subsequent cardiac monitoring since his last office visit did not show any further episodes of significant bradycardia but he did have one additional 9 beat run of nonsustained PSVT at a rate of 109 beats per  minute on 12/17/12. . Laboratory done by Dr. Shelia Media on 11/27/2012 showed a potassium of 4.2. His creatinine was 1.82. BUN 33.  An echo Doppler study done 12/09/2012 showed an estimated ejection fraction at 5055%. Although no diagnostic regional wall motion abnormalities were definitively identified, this possibility was not completely excluded. There was grade 2 diastolic dysfunction. There is mild left atrial dilatation and mild mitral regurgitation. He presents to the office today for followup evaluation. Followup blood work done on December 31 showed a potassium of 4.6. BUN 36, Cr 1.74 and normal liver function studies. He is off  supplemental potassium.  Past Medical History  Diagnosis Date  . Hypertension   . Coronary artery disease   . History of percutaneous left heart catheterization 04/2010  . Obesity   . OSA on CPAP   . Hx of echocardiogram 05/2009    EF 40-45%, he did have mild annular calcification with mild-to-moderate MR and mild TR as well as aortic valve sclerosis. Estimated RV systolic pressure was 21 mm.  . History of stress test 06/2011    No significant ischemia, this is a low risk scan. Clinical correlation recommended Abnormal myocardial perfusion study.  Marland Kitchen RBBB   . BPH (benign prostatic hyperplasia)     Past Surgical History  Procedure Laterality Date  . Coronary angioplasty with stent placement  April 2012    LAD DES  . Coronary artery bypass graft  1992    with LIMA to the LAD and diagonal.  . Cardiac catheterization  2009  he was found to have a atretic LIMA graft to his LAD and had a patent vein graft supplying his diagonal vessel. At that time, he had 70% narrowing in his LAD beyond a diagonal vessel which was initially treated medically.    Allergies  Allergen Reactions  . Nitrostat [Nitroglycerin]     Causes blood pressure to "bottom out"    Current Outpatient Prescriptions  Medication Sig Dispense Refill  . amLODipine (NORVASC) 5 MG tablet TAKE 1  TABLET BY MOUTH DAILY  30 tablet  11  . aspirin 325 MG tablet Take 325 mg by mouth daily.      . clopidogrel (PLAVIX) 75 MG tablet TAKE 1 TABLET BY MOUTH ONCE DAILY  30 tablet  6  . fenofibrate micronized (LOFIBRA) 134 MG capsule Take 134 mg by mouth daily before breakfast.      . furosemide (LASIX) 40 MG tablet Take 40 mg by mouth as needed.       . hydrochlorothiazide (MICROZIDE) 12.5 MG capsule TAKE 1 CAPSULE BY MOUTH DAILY  30 capsule  11  . ipratropium (ATROVENT) 0.06 % nasal spray Place 2 sprays into both nostrils 3 (three) times daily.      Marland Kitchen lisinopril (PRINIVIL,ZESTRIL) 40 MG tablet TAKE ONE (1) TABLET BY MOUTH EVERY DAY  90 tablet  2  . meloxicam (MOBIC) 15 MG tablet Take 1 tablet by mouth as needed.      . metoprolol (LOPRESSOR) 50 MG tablet 1/2 tablet twice daily.      Marland Kitchen omeprazole (PRILOSEC) 20 MG capsule Take 20 mg by mouth daily.      . rosuvastatin (CRESTOR) 5 MG tablet Take 5 mg by mouth daily.      . Tamsulosin HCl (FLOMAX) 0.4 MG CAPS Take 1 capsule (0.4 mg total) by mouth daily.  30 capsule  0  . traMADol-acetaminophen (ULTRACET) 37.5-325 MG per tablet Take 1 tablet by mouth as needed.       No current facility-administered medications for this visit.    History   Social History  . Marital Status: Married    Spouse Name: N/A    Number of Children: N/A  . Years of Education: N/A   Occupational History  . Not on file.   Social History Main Topics  . Smoking status: Former Research scientist (life sciences)  . Smokeless tobacco: Never Used     Comment: quit ~ 34 years ago  . Alcohol Use: No  . Drug Use: No  . Sexual Activity: Not on file   Other Topics Concern  . Not on file   Social History Narrative  . No narrative on file    Socially he is married, has 3 children 7 grandchildren. He does not use tobacco or alcohol.  ROS is negative for fevers, chills or night sweats. He denies change in vision or hearing. He denies headaches. He denies lymphadenopathy. At his home scale, he has  lost over 5 pounds since his last office visit.  He denies PND or orthopnea. He was no wheezing. There is no PND or orthopnea. He denies chest pressure. There is no chest pain. He denies abdominal pain. He denies nausea vomiting diarrhea. There is no blood in stool or urine He does have some paresthesias in his left leg. He denies significant edema. He does have obstructive sleep apnea and utilizes CPAP with 100% compliance. There is no diabetes. There is no history of endocrine abnormalities.   Other comprehensive 14 point system review is negative.  PE BP 110/80  Pulse 60  Ht 5\' 7"  (1.702 m)  Wt 217 lb 11.2 oz (98.748 kg)  BMI 34.09 kg/m2  Repeat blood pressure by me was 118/78 General: Alert, oriented, no distress.  Skin: normal turgor, no rashes HEENT: Normocephalic, atraumatic. Pupils round and reactive; sclera anicteric;no lid lag.  Nose without nasal septal hypertrophy Mouth/Parynx benign; Mallinpatti scale 3 Neck: No JVD, no carotid briuts Lungs: clear to ausculatation and percussion; no wheezing or rales Chest wall: No tenderness to palpation Heart: RRR, s1 s2 normal 1/6 systolic murmur Abdomen: Moderate central adiposity soft, nontender; no hepatosplenomehaly, BS+; abdominal aorta nontender and not dilated by palpation. Mild diastases recti. Back: No CVA tenderness Pulses 2+ Extremities: no clubbing cyanosis or edema, Homan's sign negative  Neurologic: grossly nonfocal Psychological: Normal affect and mood  ECG (independently read by me): Sinus rhythm at 60 beats per minute. Right bundle branch block; PAC, inferolateral T abnormalities.  LABS:  BMET    Component Value Date/Time   NA 140 07/20/2011 0645   K 3.7 07/20/2011 0645   CL 106 07/20/2011 0645   CO2 24 07/20/2011 0645   GLUCOSE 110* 07/20/2011 0645   BUN 29* 07/20/2011 0645   CREATININE 1.75* 07/20/2011 0645   CALCIUM 9.0 07/20/2011 0645   GFRNONAA 37* 07/20/2011 0645   GFRAA 43* 07/20/2011 0645     Hepatic  Function Panel  No results found for this basename: prot,  albumin,  ast,  alt,  alkphos,  bilitot,  bilidir,  ibili     CBC    Component Value Date/Time   WBC 5.1 07/20/2011 0122   RBC 5.10 07/20/2011 0122   HGB 14.4 07/20/2011 0122   HCT 43.2 07/20/2011 0122   PLT 137* 07/20/2011 0122   MCV 84.7 07/20/2011 0122   MCH 28.2 07/20/2011 0122   MCHC 33.3 07/20/2011 0122   RDW 14.8 07/20/2011 0122   LYMPHSABS 0.8 07/20/2011 0122   MONOABS 0.5 07/20/2011 0122   EOSABS 0.2 07/20/2011 0122   BASOSABS 0.0 07/20/2011 0122     BNP No results found for this basename: probnp    Lipid Panel  No results found for this basename: chol,  trig,  hdl,  cholhdl,  vldl,  ldlcalc     RADIOLOGY: No results found.    ASSESSMENT AND PLAN: [name] is 26 yars status post his CABG revascularization surgery. He is over 2 years since the stent was placed in the LAD beyond this diagonal vessel. He has a documented atretic LIMA graft and a patent vein graft which supplies this diagonal graft. ECG does show mild inferolateral T-wave changes. He denies recent chest pain. He has developed paroxysmal atrial fibrillation and on Cardionet monitoring was noted to be predominantly in sinus rhythm  with sinus bradycardia, but developed a 4.3 second pause, as well as an episode of nonsustained ventricular tachycardia and PSVT.  He is now on low-dose beta blocker therapy and seems to be tolerating the current dose without further bradycardia arrhythmias. Previously he had become bradycardic on his a higher dose of 75 mg twice a day. His shortness of breath has improved with restoration of sinus rhythm the he is on aspirin and Plavix and has been documented to be a Plavix responder by P2Y12 testing. His blood pressure continues to be fairly stable and previous lability has improved on current therapy.  At present, I will continue him on the lower dose metoprolol 25 mg twice a day. He'll monitor his episodes or if he develops any  episodes of dizziness and contact our office dose adjustment will be necessary.  His echo Doppler study  showed an ejection fraction of 50-55% and probable grade 2 diastolic dysfunction. He did have mild left atrial dilatation and mild mitral regurgitation. He does have mild renal insufficient and is on lisinopril. He should not take his meloxicam which he only takes on a when necessary basis. If his creatinine increases further his lisinopril dose should be reduced to 20 mg. AI am also recommend reduce his aspirin from 325 to 81 mg since he is on Plavix therapy. He is continuing to use his CPAP for his obstructive sleep apnea. He does have obesity with a body mass index of 34. I have recommended weight loss and increase exercise as tolerated. I have recommended office followup in 6 months for further evaluation.   Time spent: 25 min  Troy Sine, MD, [facility]  01/20/2013 8:16 AM

## 2013-05-01 ENCOUNTER — Other Ambulatory Visit: Payer: Self-pay | Admitting: Cardiovascular Disease

## 2013-05-01 NOTE — Telephone Encounter (Signed)
Rx was sent to pharmacy electronically. 

## 2013-05-07 ENCOUNTER — Other Ambulatory Visit: Payer: Self-pay | Admitting: Cardiovascular Disease

## 2013-05-07 NOTE — Telephone Encounter (Signed)
Rx was sent to pharmacy electronically. 

## 2013-05-26 ENCOUNTER — Other Ambulatory Visit: Payer: Self-pay | Admitting: Internal Medicine

## 2013-05-26 DIAGNOSIS — N189 Chronic kidney disease, unspecified: Secondary | ICD-10-CM

## 2013-05-28 ENCOUNTER — Ambulatory Visit
Admission: RE | Admit: 2013-05-28 | Discharge: 2013-05-28 | Disposition: A | Discharge status: Home / Self Care | Payer: Medicare Other | Source: Ambulatory Visit | Attending: Internal Medicine | Admitting: Internal Medicine

## 2013-05-28 ENCOUNTER — Other Ambulatory Visit: Payer: Self-pay | Admitting: Cardiovascular Disease

## 2013-05-28 DIAGNOSIS — N189 Chronic kidney disease, unspecified: Secondary | ICD-10-CM

## 2013-05-28 NOTE — Telephone Encounter (Signed)
Refill refused. Refills sent in on 4/23 #30 with 9 refills

## 2013-06-16 ENCOUNTER — Telehealth: Payer: Self-pay | Admitting: *Deleted

## 2013-06-16 NOTE — Telephone Encounter (Signed)
Faxed clearance- okay to hold plavix ans ASA for colonoscopy scheduled to be done on 07/22/13 by Dr. Michail Sermon.

## 2013-06-19 ENCOUNTER — Telehealth: Payer: Self-pay | Admitting: Cardiovascular Disease

## 2013-06-20 NOTE — Telephone Encounter (Signed)
Closed encounter °

## 2013-06-23 ENCOUNTER — Other Ambulatory Visit: Payer: Self-pay | Admitting: Cardiovascular Disease

## 2013-06-23 NOTE — Telephone Encounter (Signed)
Rx was sent to pharmacy electronically. 

## 2013-07-01 ENCOUNTER — Ambulatory Visit: Payer: Medicare Other | Admitting: Cardiovascular Disease

## 2013-07-02 ENCOUNTER — Encounter: Payer: Self-pay | Admitting: Cardiovascular Disease

## 2013-07-02 ENCOUNTER — Ambulatory Visit (INDEPENDENT_AMBULATORY_CARE_PROVIDER_SITE_OTHER): Payer: Medicare Other | Admitting: Cardiovascular Disease

## 2013-07-02 VITALS — BP 126/64 | HR 61 | Ht 67.0 in | Wt 210.6 lb

## 2013-07-02 DIAGNOSIS — I4510 Unspecified right bundle-branch block: Secondary | ICD-10-CM

## 2013-07-02 DIAGNOSIS — N289 Disorder of kidney and ureter, unspecified: Secondary | ICD-10-CM

## 2013-07-02 DIAGNOSIS — Z79899 Other long term (current) drug therapy: Secondary | ICD-10-CM

## 2013-07-02 DIAGNOSIS — I2510 Atherosclerotic heart disease of native coronary artery without angina pectoris: Secondary | ICD-10-CM

## 2013-07-02 DIAGNOSIS — I4891 Unspecified atrial fibrillation: Secondary | ICD-10-CM

## 2013-07-02 DIAGNOSIS — G4733 Obstructive sleep apnea (adult) (pediatric): Secondary | ICD-10-CM

## 2013-07-02 DIAGNOSIS — I480 Paroxysmal atrial fibrillation: Secondary | ICD-10-CM

## 2013-07-02 DIAGNOSIS — Z9989 Dependence on other enabling machines and devices: Secondary | ICD-10-CM

## 2013-07-02 DIAGNOSIS — I10 Essential (primary) hypertension: Secondary | ICD-10-CM

## 2013-07-02 DIAGNOSIS — I2584 Coronary atherosclerosis due to calcified coronary lesion: Secondary | ICD-10-CM

## 2013-07-02 MED ORDER — LISINOPRIL 20 MG PO TABS: 20.0000 mg | ORAL_TABLET | Freq: Every day | ORAL | Status: DC

## 2013-07-02 NOTE — Progress Notes (Signed)
Patient ID: Paul Price, male   DOB: 07-21-1940, 73 y.o.   MRN: [id number]      HPI: Paul Price, is a 73 y.o. male who presents for a 6 month cardiology followup evaluation   [name] has established coronary artery disease in 1992 underwent CABG revascularization surgery LIMA to the LAD and diagonal. In 2009 he was found to have an atretic LIMA graft to the LAD and a patent vein graft supplying the diagonal vessel. He had 70% narrowing in the LAD beyond the diagonal was treated medically. In April 2012 due to progressive chest pain and scintigraphic evidence for ischemia in the mid distal LAD territory repeat catheterization was performed and he underwent successful stenting to his LAD beyond the diagonal with a 3.0x20 mm Promus DES stent postdilated 3.25 mm. Subsequent, he has remained active and has done well. His last stress test in 2013 showed an apical defect without ischemia.  Additional problems include obstructive sleep apnea on CPAP therapy, moderate obesity, hypertension, and hyperlipidemia. He denies recent chest pain. He sees Dr. Shelia Media for primary care.  On November 19, 2012 he saw Kerin Ransom.PAC for suspected atrial fibrillation and CHF after having been seen by his primary physician and noted some increasing shortness of breath with activity. A CardioNet monitor was performed and he was demonstrated to have a 4.3 second pause which led to a reduction in his  beta blocker dose  from Lopressor 75 mg twice a day to 25 twice a day. He saw Tarri Fuller on 12/02/2012 at which time he was in sinus rhythm but also was found to have probably 9 beat run of nonsustained VT on monitor which is he was maintained on low-dose beta blocker therapy. Subsequent cardiac monitoring did not show any further episodes of significant bradycardia but he did have one additional 9 beat run of nonsustained PSVT at a rate of 109 beats per minute on 12/17/12. . Laboratory done by Dr. Shelia Media on 11/27/2012 showed a  potassium of 4.2. His creatinine was 1.82. BUN 33.  An echo Doppler study done 12/09/2012 showed an estimated ejection fraction at 50-55%. Although no diagnostic regional wall motion abnormalities were definitively identified, this possibility was not completely excluded. There was grade 2 diastolic dysfunction. There is mild left atrial dilatation and mild mitral regurgitation.  Since I last saw him he denies recurrent episodes of chest pain.  He tells me he recently had seen Dr. Shelia Media, and laboratory was done.  He was told of possibly having worsening kidney insufficiency.  He underwent a renal ultrasound, which did not show evidence for hydronephrosis and did demonstrate bilateral small renal cysts.  He denies anginal symptoms.  He denies any awareness of palpitations.  He's been using CPAP with 100% compliance.  Past Medical History  Diagnosis Date  . Hypertension   . Coronary artery disease   . History of percutaneous left heart catheterization 04/2010  . Obesity   . OSA on CPAP   . Hx of echocardiogram 05/2009    EF 40-45%, he did have mild annular calcification with mild-to-moderate MR and mild TR as well as aortic valve sclerosis. Estimated RV systolic pressure was 21 mm.  . History of stress test 06/2011    No significant ischemia, this is a low risk scan. Clinical correlation recommended Abnormal myocardial perfusion study.  Marland Kitchen RBBB   . BPH (benign prostatic hyperplasia)     Past Surgical History  Procedure Laterality Date  . Coronary angioplasty with stent  placement  April 2012    LAD DES  . Coronary artery bypass graft  1992    with LIMA to the LAD and diagonal.  . Cardiac catheterization  2009    he was found to have a atretic LIMA graft to his LAD and had a patent vein graft supplying his diagonal vessel. At that time, he had 70% narrowing in his LAD beyond a diagonal vessel which was initially treated medically.    Allergies  Allergen Reactions  . Nitrostat  [Nitroglycerin]     Causes blood pressure to "bottom out"    Current Outpatient Prescriptions  Medication Sig Dispense Refill  . ranitidine (ZANTAC) 150 MG tablet Take 150 mg by mouth as needed for heartburn.      . clopidogrel (PLAVIX) 75 MG tablet TAKE 1 TABLET BY MOUTH DAILY  30 tablet  9  . fenofibrate micronized (LOFIBRA) 134 MG capsule Take 134 mg by mouth daily before breakfast.      . furosemide (LASIX) 40 MG tablet Take 40 mg by mouth as needed.       . hydrochlorothiazide (MICROZIDE) 12.5 MG capsule TAKE 1 CAPSULE BY MOUTH DAILY  30 capsule  7  . HYDROcodone-acetaminophen (NORCO/VICODIN) 5-325 MG per tablet as needed.      Marland Kitchen ipratropium (ATROVENT) 0.06 % nasal spray Place 2 sprays into both nostrils 3 (three) times daily.      Marland Kitchen lisinopril (PRINIVIL,ZESTRIL) 20 MG tablet Take 1 tablet (20 mg total) by mouth daily.  90 tablet  3  . metoprolol (LOPRESSOR) 50 MG tablet 1/2 tablet twice daily.      . rosuvastatin (CRESTOR) 5 MG tablet Take 5 mg by mouth daily.      . Tamsulosin HCl (FLOMAX) 0.4 MG CAPS Take 1 capsule (0.4 mg total) by mouth daily.  30 capsule  0  . traMADol-acetaminophen (ULTRACET) 37.5-325 MG per tablet Take 1 tablet by mouth as needed.       No current facility-administered medications for this visit.    History   Social History  . Marital Status: Married    Spouse Name: N/A    Number of Children: N/A  . Years of Education: N/A   Occupational History  . Not on file.   Social History Main Topics  . Smoking status: Former Research scientist (life sciences)  . Smokeless tobacco: Never Used     Comment: quit ~ 34 years ago  . Alcohol Use: No  . Drug Use: No  . Sexual Activity: Not on file   Other Topics Concern  . Not on file   Social History Narrative  . No narrative on file    Socially he is married, has 3 children 7 grandchildren. He does not use tobacco or alcohol.  ROS General: Negative; No fevers, chills, or night sweats;  HEENT: Negative; No changes in vision or  hearing, sinus congestion, difficulty swallowing Pulmonary: Negative; No cough, wheezing, shortness of breath, hemoptysis Cardiovascular: Negative; No chest pain, presyncope, syncope, palpatations GI: Negative; No nausea, vomiting, diarrhea, or abdominal pain GU: Negative; No dysuria, hematuria, or difficulty voiding Musculoskeletal: Negative; no myalgias, joint pain, or weakness Hematologic/Oncology: Negative; no easy bruising, bleeding Endocrine: Negative; no heat/cold intolerance; no diabetes Neuro: Occasional paresthesias; no changes in balance, headaches Skin: Negative; No rashes or skin lesions Psychiatric: Negative; No behavioral problems, depression Sleep: Positive for sleep apnea on CPAP; No snoring, daytime sleepiness, hypersomnolence, bruxism, restless legs, hypnogognic hallucinations, no cataplexy Other comprehensive 14 point system review is negative.   PE  BP 126/64  Pulse 61  Ht 5\' 7"  (1.702 m)  Wt 210 lb 9.6 oz (95.528 kg)  BMI 32.98 kg/m2  Repeat blood pressure by me was 118/78 General: Alert, oriented, no distress.  Skin: normal turgor, no rashes HEENT: Normocephalic, atraumatic. Pupils round and reactive; sclera anicteric;no lid lag.  Nose without nasal septal hypertrophy Mouth/Parynx benign; Mallinpatti scale 3 Neck: Thick neck; No JVD, no carotid briuts Lungs: clear to ausculatation and percussion; no wheezing or rales Chest wall: No tenderness to palpation Heart: RRR, s1 s2 normal 1/6 systolic murmur; no S3 gallop.  No diastolic murmur, rubs, thrills or heaves. Abdomen: Moderate central adiposity soft, nontender; no hepatosplenomehaly, BS+; abdominal aorta nontender and not dilated by palpation. Moderate diastases recti. Back: No CVA tenderness Pulses 2+ Extremities: no clubbing cyanosis or edema, Homan's sign negative  Neurologic: grossly nonfocal Psychological: Normal affect and mood  ECG (independently read by me): Normal sinus rhythm at 61 beats per  minute.  Right bundle branch block.  Mild voltage criteria for LVH in aVL  ECG (independently read by me): Sinus rhythm at 60 beats per minute. Right bundle branch block; PAC, inferolateral T abnormalities.  LABS:  BMET    Component Value Date/Time   NA 140 07/20/2011 0645   K 3.7 07/20/2011 0645   CL 106 07/20/2011 0645   CO2 24 07/20/2011 0645   GLUCOSE 110* 07/20/2011 0645   BUN 29* 07/20/2011 0645   CREATININE 1.75* 07/20/2011 0645   CALCIUM 9.0 07/20/2011 0645   GFRNONAA 37* 07/20/2011 0645   GFRAA 43* 07/20/2011 0645     Hepatic Function Panel  No results found for this basename: prot,  albumin,  ast,  alt,  alkphos,  bilitot,  bilidir,  ibili     CBC    Component Value Date/Time   WBC 5.1 07/20/2011 0122   RBC 5.10 07/20/2011 0122   HGB 14.4 07/20/2011 0122   HCT 43.2 07/20/2011 0122   PLT 137* 07/20/2011 0122   MCV 84.7 07/20/2011 0122   MCH 28.2 07/20/2011 0122   MCHC 33.3 07/20/2011 0122   RDW 14.8 07/20/2011 0122   LYMPHSABS 0.8 07/20/2011 0122   MONOABS 0.5 07/20/2011 0122   EOSABS 0.2 07/20/2011 0122   BASOSABS 0.0 07/20/2011 0122     BNP No results found for this basename: probnp    Lipid Panel  No results found for this basename: chol,  trig,  hdl,  cholhdl,  vldl,  ldlcalc     RADIOLOGY: No results found.    ASSESSMENT AND PLAN: [name] is 39 yars status post his CABG revascularization surgery and 3 years since a stent was placed in the LAD beyond this diagonal vessel. He has a documented atretic LIMA graft and a patent vein graft which supplies this diagonal graft.   His echo Doppler study  showed an ejection fraction of 50-55% and probable grade 2 diastolic dysfunction. He did have mild left atrial dilatation and mild mitral regurgitation.  He tells me he recently saw Dr. Concha Pyo and blood work was obtained, which did show worsening of renal function.  I have recommended he discontinue his meloxicam.  I also have recommended he reduce his lisinopril from 40 mg  to 20 mg.  He now is on Plavix in addition to aspirin and his aspirin dose should be 81 mg, but apparently he is still taking 325 mg.  I also recommended he discontinue the omeprazole and if he does have GERD symptoms to consider over-the-counter Zantac.  He will monitor his blood pressure.  I will recheck a chemistry profile next week to see if there is improvement in his renal function.  After he left the office, we were able to obtain blood work from Dr. Lambert Mody evaluation and this confirmed his creatinine had risen to 2.04.  On 05/20/2013 and had been 1.74 on 01/08/2013.  He will be undergoing a renal evaluation.  I will see him in 6 months for cardiology evaluation or sooner if problems arise.  Time spent: 25 min  Troy Sine, MD, [facility]  07/02/2013 2:17 PM

## 2013-07-02 NOTE — Patient Instructions (Addendum)
STOP the Meloxicam.  STOP the Omeprazole.  Decrease Lisinopril to 20mg  daily.  Take Zantac over the counter as needed for reflux.  Your physician recommends that you return for lab work in: One week.  Your physician recommends that you schedule a follow-up appointment in: 6 months with Dr. Claiborne Billings.

## 2013-07-15 LAB — COMPREHENSIVE METABOLIC PANEL
ALT: 24 U/L (ref 0–53)
AST: 27 U/L (ref 0–37)
Albumin: 4 g/dL (ref 3.5–5.2)
Alkaline Phosphatase: 59 U/L (ref 39–117)
BUN: 48 mg/dL — ABNORMAL HIGH (ref 6–23)
CO2: 26 mEq/L (ref 19–32)
Calcium: 9.8 mg/dL (ref 8.4–10.5)
Chloride: 101 mEq/L (ref 96–112)
Creat: 2 mg/dL — ABNORMAL HIGH (ref 0.50–1.35)
Glucose, Bld: 150 mg/dL — ABNORMAL HIGH (ref 70–99)
Potassium: 4.1 mEq/L (ref 3.5–5.3)
Sodium: 140 mEq/L (ref 135–145)
Total Bilirubin: 0.4 mg/dL (ref 0.2–1.2)
Total Protein: 7.1 g/dL (ref 6.0–8.3)

## 2013-07-22 ENCOUNTER — Other Ambulatory Visit: Payer: Self-pay | Admitting: Gastroenterology

## 2013-08-13 ENCOUNTER — Other Ambulatory Visit: Payer: Self-pay | Admitting: *Deleted

## 2013-08-13 DIAGNOSIS — R899 Unspecified abnormal finding in specimens from other organs, systems and tissues: Secondary | ICD-10-CM

## 2013-08-13 DIAGNOSIS — Z79899 Other long term (current) drug therapy: Secondary | ICD-10-CM

## 2013-08-27 LAB — BASIC METABOLIC PANEL
BUN: 76 mg/dL — ABNORMAL HIGH (ref 6–23)
CO2: 30 mEq/L (ref 19–32)
Calcium: 9.8 mg/dL (ref 8.4–10.5)
Chloride: 100 mEq/L (ref 96–112)
Creat: 2.93 mg/dL — ABNORMAL HIGH (ref 0.50–1.35)
Glucose, Bld: 126 mg/dL — ABNORMAL HIGH (ref 70–99)
Potassium: 4.2 mEq/L (ref 3.5–5.3)
Sodium: 141 mEq/L (ref 135–145)

## 2013-09-09 ENCOUNTER — Other Ambulatory Visit: Payer: Self-pay | Admitting: *Deleted

## 2013-09-09 DIAGNOSIS — R899 Unspecified abnormal finding in specimens from other organs, systems and tissues: Secondary | ICD-10-CM

## 2013-09-18 LAB — BASIC METABOLIC PANEL
BUN: 59 mg/dL — ABNORMAL HIGH (ref 6–23)
CO2: 28 mEq/L (ref 19–32)
Calcium: 9.8 mg/dL (ref 8.4–10.5)
Chloride: 97 mEq/L (ref 96–112)
Creat: 2.41 mg/dL — ABNORMAL HIGH (ref 0.50–1.35)
Glucose, Bld: 102 mg/dL — ABNORMAL HIGH (ref 70–99)
Potassium: 3.7 mEq/L (ref 3.5–5.3)
Sodium: 139 mEq/L (ref 135–145)

## 2013-09-19 ENCOUNTER — Encounter: Payer: Self-pay | Admitting: *Deleted

## 2013-09-19 DIAGNOSIS — Z79899 Other long term (current) drug therapy: Secondary | ICD-10-CM

## 2013-09-19 NOTE — Telephone Encounter (Signed)
Message copied by Tressa Busman on Fri Sep 19, 2013 11:32 AM ------      Message from: Sanda Klein      Created: Fri Sep 19, 2013  9:49 AM       Renal function improved from 3 weeks ago, would recommend rechecking in 4-6 weeks ------

## 2013-09-19 NOTE — Telephone Encounter (Signed)
Order place for BMP to be done in 4-6 weeks.

## 2013-09-29 ENCOUNTER — Telehealth: Payer: Self-pay | Admitting: *Deleted

## 2013-09-29 NOTE — Telephone Encounter (Signed)
Attempted to call patient to give orders from Dr. Claiborne Billings after he reviewed last BMET. everytime I would call the phone would hang up once some one picked up on the other end. I will attempt later.

## 2013-09-29 NOTE — Telephone Encounter (Signed)
Message copied by Lauralee Evener on Mon Sep 29, 2013  3:39 PM ------      Message from: Shelva Majestic A      Created: Thu Sep 25, 2013  9:52 AM       Cr better; reduce lisinopril to 10 mg from 20 mg. F/ u bmet in 2-3 weeks ------

## 2013-10-13 ENCOUNTER — Other Ambulatory Visit: Payer: Self-pay

## 2013-10-13 ENCOUNTER — Telehealth: Payer: Self-pay | Admitting: Cardiovascular Disease

## 2013-10-13 DIAGNOSIS — Z79899 Other long term (current) drug therapy: Secondary | ICD-10-CM

## 2013-10-13 MED ORDER — METOPROLOL TARTRATE 50 MG PO TABS: 50.0000 mg | ORAL_TABLET | Freq: Two times a day (BID) | ORAL | Status: DC

## 2013-10-13 NOTE — Telephone Encounter (Signed)
Pt called in stating that he was returning Barbara's call in reference to having some labs done. Please call  Thanks

## 2013-10-13 NOTE — Telephone Encounter (Signed)
Rx sent to pharmacy   

## 2013-10-13 NOTE — Telephone Encounter (Signed)
States he was taken off Lisinopril back in August with no change to his BP.  Will get lab rechecked next week.

## 2013-10-20 LAB — BASIC METABOLIC PANEL
BUN: 52 mg/dL — ABNORMAL HIGH (ref 6–23)
CO2: 29 mEq/L (ref 19–32)
Calcium: 9.8 mg/dL (ref 8.4–10.5)
Chloride: 100 mEq/L (ref 96–112)
Creat: 2.25 mg/dL — ABNORMAL HIGH (ref 0.50–1.35)
Glucose, Bld: 151 mg/dL — ABNORMAL HIGH (ref 70–99)
Potassium: 4.4 mEq/L (ref 3.5–5.3)
Sodium: 142 mEq/L (ref 135–145)

## 2013-10-22 ENCOUNTER — Other Ambulatory Visit: Payer: Self-pay | Admitting: *Deleted

## 2013-10-22 ENCOUNTER — Telehealth: Payer: Self-pay | Admitting: *Deleted

## 2013-10-22 DIAGNOSIS — R899 Unspecified abnormal finding in specimens from other organs, systems and tissues: Secondary | ICD-10-CM

## 2013-10-22 NOTE — Telephone Encounter (Signed)
Message copied by Lauralee Evener on Wed Oct 22, 2013  5:09 PM ------      Message from: Shelva Majestic A      Created: Tue Oct 21, 2013  8:11 AM       Cr elevated but better. Is he still on lisinopril? ; if so decrease dose by 1/2 and re check bmet in 2 weeks ------

## 2013-10-22 NOTE — Telephone Encounter (Signed)
Spoke with patient informing him of his lab results. Patient states that Dr. Claiborne Billings discontinued the lisinopril on 09/09/13. I will make Dr. Claiborne Billings aware of this. He may want to make other medication adjustments.

## 2013-11-05 LAB — BASIC METABOLIC PANEL
BUN: 54 mg/dL — ABNORMAL HIGH (ref 6–23)
CO2: 27 mEq/L (ref 19–32)
Calcium: 9.5 mg/dL (ref 8.4–10.5)
Chloride: 101 mEq/L (ref 96–112)
Creat: 2.41 mg/dL — ABNORMAL HIGH (ref 0.50–1.35)
Glucose, Bld: 189 mg/dL — ABNORMAL HIGH (ref 70–99)
Potassium: 3.9 mEq/L (ref 3.5–5.3)
Sodium: 140 mEq/L (ref 135–145)

## 2013-11-20 ENCOUNTER — Telehealth: Payer: Self-pay | Admitting: *Deleted

## 2013-11-20 NOTE — Telephone Encounter (Signed)
-----   Message from Troy Sine, MD sent at 11/17/2013  6:54 PM EST ----- Cr remains elevated. Has he seen renal?  May need to decrease or DC lisinopril if he is still on this

## 2013-11-20 NOTE — Telephone Encounter (Signed)
Left message for patient to return a call to me to discuss lab work.

## 2013-11-21 ENCOUNTER — Other Ambulatory Visit: Payer: Self-pay | Admitting: *Deleted

## 2013-11-21 ENCOUNTER — Telehealth: Payer: Self-pay | Admitting: Cardiovascular Disease

## 2013-11-21 DIAGNOSIS — N289 Disorder of kidney and ureter, unspecified: Secondary | ICD-10-CM

## 2013-11-21 NOTE — Telephone Encounter (Signed)
Pt says that he was returning Wanda's call from yesterday. He think that it is in regards to some blood work he had done recently. Please call  Thanks

## 2013-11-21 NOTE — Telephone Encounter (Signed)
FORWARD TO [facility]

## 2013-11-21 NOTE — Telephone Encounter (Signed)
Patient returned a call to me and was notified that Dr. Claiborne Billings would like for him to have a renal referral to evaluate elevated kidney functions. Patient voiced understanding.

## 2013-11-21 NOTE — Telephone Encounter (Signed)
Spoke with Dr. Claiborne Billings informed him the patient is no longer on Lisinopril. Dr. Claiborne Billings recommended patient to have a renal referral. I am waiting for a return call from the patient so that I can advise him of Dr. Evette Georges recommendations.

## 2013-11-21 NOTE — Telephone Encounter (Signed)
Left message to call back @ 11:50 am.

## 2013-11-26 ENCOUNTER — Ambulatory Visit (HOSPITAL_COMMUNITY)
Admission: RE | Admit: 2013-11-26 | Discharge: 2013-11-26 | Disposition: A | Discharge status: Home / Self Care | Payer: Medicare Other | Source: Ambulatory Visit | Attending: Vascular Surgery | Admitting: Vascular Surgery

## 2013-11-26 ENCOUNTER — Other Ambulatory Visit (HOSPITAL_COMMUNITY): Payer: Self-pay | Admitting: Internal Medicine

## 2013-11-26 DIAGNOSIS — R609 Edema, unspecified: Secondary | ICD-10-CM | POA: Insufficient documentation

## 2013-11-28 ENCOUNTER — Encounter: Payer: Self-pay | Admitting: Cardiovascular Disease

## 2013-12-01 ENCOUNTER — Inpatient Hospital Stay (HOSPITAL_COMMUNITY)
Admission: EM | Admit: 2013-12-01 | Discharge: 2013-12-09 | DRG: 558 | Disposition: A | Discharge status: Skilled Nursing Facility | Payer: Medicare Other | Attending: Internal Medicine | Admitting: Internal Medicine

## 2013-12-01 ENCOUNTER — Encounter (HOSPITAL_COMMUNITY): Payer: Self-pay | Admitting: Family Medicine

## 2013-12-01 ENCOUNTER — Emergency Department (HOSPITAL_COMMUNITY): Payer: Medicare Other

## 2013-12-01 DIAGNOSIS — E860 Dehydration: Secondary | ICD-10-CM | POA: Diagnosis present

## 2013-12-01 DIAGNOSIS — M6080 Other myositis, unspecified site: Secondary | ICD-10-CM | POA: Diagnosis present

## 2013-12-01 DIAGNOSIS — M1000 Idiopathic gout, unspecified site: Secondary | ICD-10-CM | POA: Insufficient documentation

## 2013-12-01 DIAGNOSIS — M109 Gout, unspecified: Secondary | ICD-10-CM | POA: Diagnosis not present

## 2013-12-01 DIAGNOSIS — Z951 Presence of aortocoronary bypass graft: Secondary | ICD-10-CM

## 2013-12-01 DIAGNOSIS — R55 Syncope and collapse: Secondary | ICD-10-CM | POA: Diagnosis present

## 2013-12-01 DIAGNOSIS — M6008 Infective myositis, other site: Secondary | ICD-10-CM | POA: Diagnosis not present

## 2013-12-01 DIAGNOSIS — M549 Dorsalgia, unspecified: Secondary | ICD-10-CM | POA: Diagnosis not present

## 2013-12-01 DIAGNOSIS — R509 Fever, unspecified: Secondary | ICD-10-CM | POA: Diagnosis present

## 2013-12-01 DIAGNOSIS — R079 Chest pain, unspecified: Secondary | ICD-10-CM

## 2013-12-01 DIAGNOSIS — N179 Acute kidney failure, unspecified: Secondary | ICD-10-CM | POA: Diagnosis present

## 2013-12-01 DIAGNOSIS — I10 Essential (primary) hypertension: Secondary | ICD-10-CM | POA: Diagnosis present

## 2013-12-01 DIAGNOSIS — Z7901 Long term (current) use of anticoagulants: Secondary | ICD-10-CM

## 2013-12-01 DIAGNOSIS — N189 Chronic kidney disease, unspecified: Secondary | ICD-10-CM | POA: Diagnosis present

## 2013-12-01 DIAGNOSIS — Z87891 Personal history of nicotine dependence: Secondary | ICD-10-CM

## 2013-12-01 DIAGNOSIS — I82402 Acute embolism and thrombosis of unspecified deep veins of left lower extremity: Secondary | ICD-10-CM | POA: Diagnosis present

## 2013-12-01 DIAGNOSIS — Z8249 Family history of ischemic heart disease and other diseases of the circulatory system: Secondary | ICD-10-CM

## 2013-12-01 DIAGNOSIS — N289 Disorder of kidney and ureter, unspecified: Secondary | ICD-10-CM

## 2013-12-01 DIAGNOSIS — E785 Hyperlipidemia, unspecified: Secondary | ICD-10-CM | POA: Diagnosis present

## 2013-12-01 DIAGNOSIS — I2510 Atherosclerotic heart disease of native coronary artery without angina pectoris: Secondary | ICD-10-CM | POA: Diagnosis present

## 2013-12-01 DIAGNOSIS — Z955 Presence of coronary angioplasty implant and graft: Secondary | ICD-10-CM

## 2013-12-01 DIAGNOSIS — I129 Hypertensive chronic kidney disease with stage 1 through stage 4 chronic kidney disease, or unspecified chronic kidney disease: Secondary | ICD-10-CM | POA: Diagnosis present

## 2013-12-01 DIAGNOSIS — I4510 Unspecified right bundle-branch block: Secondary | ICD-10-CM | POA: Diagnosis present

## 2013-12-01 DIAGNOSIS — Z9989 Dependence on other enabling machines and devices: Secondary | ICD-10-CM

## 2013-12-01 DIAGNOSIS — G4733 Obstructive sleep apnea (adult) (pediatric): Secondary | ICD-10-CM | POA: Diagnosis present

## 2013-12-01 DIAGNOSIS — E669 Obesity, unspecified: Secondary | ICD-10-CM | POA: Diagnosis present

## 2013-12-01 DIAGNOSIS — N400 Enlarged prostate without lower urinary tract symptoms: Secondary | ICD-10-CM | POA: Diagnosis present

## 2013-12-01 LAB — CBC WITH DIFFERENTIAL/PLATELET
Basophils Absolute: 0 10*3/uL (ref 0.0–0.1)
Basophils Relative: 0 % (ref 0–1)
Eosinophils Absolute: 0 10*3/uL (ref 0.0–0.7)
Eosinophils Relative: 0 % (ref 0–5)
HCT: 42 % (ref 39.0–52.0)
Hemoglobin: 13.8 g/dL (ref 13.0–17.0)
Lymphocytes Relative: 6 % — ABNORMAL LOW (ref 12–46)
Lymphs Abs: 0.7 10*3/uL (ref 0.7–4.0)
MCH: 30.5 pg (ref 26.0–34.0)
MCHC: 32.9 g/dL (ref 30.0–36.0)
MCV: 92.7 fL (ref 78.0–100.0)
Monocytes Absolute: 1.3 10*3/uL — ABNORMAL HIGH (ref 0.1–1.0)
Monocytes Relative: 11 % (ref 3–12)
Neutro Abs: 9.3 10*3/uL — ABNORMAL HIGH (ref 1.7–7.7)
Neutrophils Relative %: 83 % — ABNORMAL HIGH (ref 43–77)
Platelets: 156 10*3/uL (ref 150–400)
RBC: 4.53 MIL/uL (ref 4.22–5.81)
RDW: 13.2 % (ref 11.5–15.5)
WBC: 11.3 10*3/uL — ABNORMAL HIGH (ref 4.0–10.5)

## 2013-12-01 LAB — I-STAT CHEM 8, ED
BUN: 42 mg/dL — ABNORMAL HIGH (ref 6–23)
Calcium, Ion: 1.12 mmol/L — ABNORMAL LOW (ref 1.13–1.30)
Chloride: 97 mEq/L (ref 96–112)
Creatinine, Ser: 2.6 mg/dL — ABNORMAL HIGH (ref 0.50–1.35)
Glucose, Bld: 135 mg/dL — ABNORMAL HIGH (ref 70–99)
HCT: 45 % (ref 39.0–52.0)
Hemoglobin: 15.3 g/dL (ref 13.0–17.0)
Potassium: 3.7 mEq/L (ref 3.7–5.3)
Sodium: 135 mEq/L — ABNORMAL LOW (ref 137–147)
TCO2: 29 mmol/L (ref 0–100)

## 2013-12-01 LAB — I-STAT TROPONIN, ED: Troponin i, poc: 0.02 ng/mL (ref 0.00–0.08)

## 2013-12-01 LAB — URINALYSIS, ROUTINE W REFLEX MICROSCOPIC
Bilirubin Urine: NEGATIVE
Glucose, UA: NEGATIVE mg/dL
Ketones, ur: NEGATIVE mg/dL
Leukocytes, UA: NEGATIVE
Nitrite: NEGATIVE
Protein, ur: 30 mg/dL — AB
Specific Gravity, Urine: 1.016 (ref 1.005–1.030)
Urobilinogen, UA: 1 mg/dL (ref 0.0–1.0)
pH: 5 (ref 5.0–8.0)

## 2013-12-01 LAB — PROTIME-INR
INR: 2.5 — ABNORMAL HIGH (ref 0.00–1.49)
Prothrombin Time: 27.2 seconds — ABNORMAL HIGH (ref 11.6–15.2)

## 2013-12-01 LAB — URINE MICROSCOPIC-ADD ON

## 2013-12-01 MED ORDER — OXYCODONE-ACETAMINOPHEN 5-325 MG PO TABS
1.0000 | ORAL_TABLET | Freq: Once | ORAL | Status: AC
  Administered 2013-12-01: 1 via ORAL
  Filled 2013-12-01: qty 1

## 2013-12-01 MED ORDER — ONDANSETRON HCL 4 MG/2ML IJ SOLN
4.0000 mg | Freq: Once | INTRAMUSCULAR | Status: AC
  Administered 2013-12-01: 4 mg via INTRAVENOUS
  Filled 2013-12-01: qty 2

## 2013-12-01 MED ORDER — APIXABAN 5 MG PO TABS
10.0000 mg | ORAL_TABLET | Freq: Two times a day (BID) | ORAL | Status: DC
  Administered 2013-12-01 – 2013-12-03 (×4): 10 mg via ORAL
  Filled 2013-12-01 (×6): qty 2

## 2013-12-01 MED ORDER — FENTANYL CITRATE 0.05 MG/ML IJ SOLN
50.0000 ug | Freq: Once | INTRAMUSCULAR | Status: AC
  Administered 2013-12-01: 50 ug via INTRAVENOUS
  Filled 2013-12-01: qty 2

## 2013-12-01 MED ORDER — OXYCODONE-ACETAMINOPHEN 5-325 MG PO TABS: 1.0000 | ORAL_TABLET | Freq: Four times a day (QID) | ORAL | Status: DC | PRN

## 2013-12-01 MED ORDER — FENTANYL CITRATE 0.05 MG/ML IJ SOLN
100.0000 ug | Freq: Once | INTRAMUSCULAR | Status: AC
  Administered 2013-12-01: 100 ug via INTRAVENOUS
  Filled 2013-12-01: qty 2

## 2013-12-01 MED ORDER — TECHNETIUM TC 99M DIETHYLENETRIAME-PENTAACETIC ACID: 40.0000 | Freq: Once | INTRAVENOUS | Status: AC | PRN

## 2013-12-01 MED ORDER — TECHNETIUM TO 99M ALBUMIN AGGREGATED
6.0000 | Freq: Once | INTRAVENOUS | Status: AC | PRN
  Administered 2013-12-01: 6 via INTRAVENOUS

## 2013-12-01 NOTE — ED Provider Notes (Signed)
CSN: [id number]     Arrival date & time 12/01/13  1306 History   First MD Initiated Contact with Patient 12/01/13 1330     Chief Complaint  Patient presents with  . Back Pain     (Consider location/radiation/quality/duration/timing/severity/associated sxs/prior Treatment) HPI   CYLAS BELMONT is a 73 y.o. male presents for evaluation of upper back pain, "under my ribs".  This discomfort began rather suddenly 3 days ago.  The discomfort is persistent, and unrelenting.  He did not take any medication for it.  He has occasional cough, which exacerbates the pain.  He denies fever, chills, sputum production, chest pain, weakness or dizziness.  He is being treated for a left leg DVT, with Eliquist, for 1 week.  He called his PCP, this morning, and was referred here for evaluation.  He has known renal insufficiency and suspects that his creatinine is 2.5.  There are no other known modifying factors.    Past Medical History  Diagnosis Date  . Hypertension   . Coronary artery disease   . History of percutaneous left heart catheterization 04/2010  . Obesity   . OSA on CPAP   . Hx of echocardiogram 05/2009    EF 40-45%, he did have mild annular calcification with mild-to-moderate MR and mild TR as well as aortic valve sclerosis. Estimated RV systolic pressure was 21 mm.  . History of stress test 06/2011    No significant ischemia, this is a low risk scan. Clinical correlation recommended Abnormal myocardial perfusion study.  Marland Kitchen RBBB   . BPH (benign prostatic hyperplasia)    Past Surgical History  Procedure Laterality Date  . Coronary angioplasty with stent placement  April 2012    LAD DES  . Coronary artery bypass graft  1992    with LIMA to the LAD and diagonal.  . Cardiac catheterization  2009    he was found to have a atretic LIMA graft to his LAD and had a patent vein graft supplying his diagonal vessel. At that time, he had 70% narrowing in his LAD beyond a diagonal vessel which was  initially treated medically.   Family History  Problem Relation Age of Onset  . Heart disease Father    History  Substance Use Topics  . Smoking status: Former Research scientist (life sciences)  . Smokeless tobacco: Never Used     Comment: quit ~ 34 years ago  . Alcohol Use: No    Review of Systems  All other systems reviewed and are negative.     Allergies  Nitrostat  Home Medications   Prior to Admission medications   Medication Sig Start Date End Date Taking? Authorizing Provider  apixaban (ELIQUIS) 5 MG TABS tablet Take 10 mg by mouth 2 (two) times daily.   Yes Historical Provider, MD  atorvastatin (LIPITOR) 10 MG tablet Take 10 mg by mouth daily.   Yes Historical Provider, MD  fenofibrate micronized (LOFIBRA) 134 MG capsule Take 134 mg by mouth daily before breakfast.   Yes Historical Provider, MD  furosemide (LASIX) 40 MG tablet Take 40 mg by mouth daily.    Yes Historical Provider, MD  hydrochlorothiazide (MICROZIDE) 12.5 MG capsule TAKE 1 CAPSULE BY MOUTH DAILY 06/23/13  Yes Troy Sine, MD  metoprolol (LOPRESSOR) 50 MG tablet Take 1 tablet (50 mg total) by mouth 2 (two) times daily. Patient taking differently: Take 25 mg by mouth 2 (two) times daily.  10/13/13  Yes Troy Sine, MD  ranitidine (ZANTAC) 150 MG tablet  Take 150 mg by mouth at bedtime.    Yes Historical Provider, MD  Tamsulosin HCl (FLOMAX) 0.4 MG CAPS Take 1 capsule (0.4 mg total) by mouth daily. 07/20/11  Yes Chauncy Passy, MD  clopidogrel (PLAVIX) 75 MG tablet TAKE 1 TABLET BY MOUTH DAILY Patient not taking: Reported on 12/01/2013 05/01/13   Troy Sine, MD   BP 135/66 mmHg  Pulse 84  Temp(Src) 98.3 F (36.8 C) (Oral)  Resp 30  SpO2 95% Physical Exam  Constitutional: He is oriented to person, place, and time. He appears well-developed and well-nourished.  HENT:  Head: Normocephalic and atraumatic.  Right Ear: External ear normal.  Left Ear: External ear normal.  Eyes: Conjunctivae and EOM are normal. Pupils are  equal, round, and reactive to light.  Neck: Normal range of motion and phonation normal. Neck supple.  Cardiovascular: Normal rate, regular rhythm and normal heart sounds.   Pulmonary/Chest: Effort normal and breath sounds normal. He exhibits no bony tenderness.  Abdominal: Soft. There is no tenderness.  Musculoskeletal: Normal range of motion.  He has upper back pain, with movement.  Neurological: He is alert and oriented to person, place, and time. No cranial nerve deficit or sensory deficit. He exhibits normal muscle tone. Coordination normal.  Skin: Skin is warm, dry and intact.  Psychiatric: He has a normal mood and affect. His behavior is normal. Judgment and thought content normal.  Nursing note and vitals reviewed.   ED Course  Procedures (including critical care time)  Medications  technetium TC 42M diethylenetriame-pentaacetic acid (DTPA) injection 40 milli Curie (not administered)  fentaNYL (SUBLIMAZE) injection 100 mcg (100 mcg Intravenous Given 12/01/13 1533)  ondansetron (ZOFRAN) injection 4 mg (4 mg Intravenous Given 12/01/13 1533)  fentaNYL (SUBLIMAZE) injection 50 mcg (50 mcg Intravenous Given 12/01/13 1718)  technetium albumin aggregated (MAA) injection solution 6 milli Curie (6 milli Curies Intravenous Contrast Given 12/01/13 1717)    Patient Vitals for the past 24 hrs:  BP Temp Temp src Pulse Resp SpO2 Height  12/01/13 1530 135/66 mmHg - - 84 (!) 30 95 % -  12/01/13 1515 138/64 mmHg - - 80 26 96 % -  12/01/13 1500 135/62 mmHg - - 85 (!) 29 95 % -  12/01/13 1445 (!) 131/54 mmHg - - 83 (!) 29 96 % -  12/01/13 1430 138/64 mmHg - - 79 19 97 % -  12/01/13 1415 146/61 mmHg - - 80 18 96 % -  12/01/13 1400 142/63 mmHg - - 83 17 97 % -  12/01/13 1345 132/55 mmHg - - 81 18 96 % -  12/01/13 1330 140/55 mmHg - - 87 (!) 34 99 % -  12/01/13 1318 127/63 mmHg 98.3 F (36.8 C) Oral 88 18 98 % -    15:30- case discussed with radiologist, Dr. Maryfrances Bunnell; regarding modalities that  could better document his particular problem.   VQ scan, an MRI scan, ordered to evaluate pain.  5:30 PM Reevaluation with update and discussion. After initial assessment and treatment, an updated evaluation reveals he required additional narcotic analgesia. [facility] St[facility] L   Nonspecific upper back pain, inpatient who was recently anticoagulated. Labs Review Labs Reviewed  CBC WITH DIFFERENTIAL - Abnormal; Notable for the following:    WBC 11.3 (*)    Neutrophils Relative % 83 (*)    Neutro Abs 9.3 (*)    Lymphocytes Relative 6 (*)    Monocytes Absolute 1.3 (*)    All other components within normal limits  PROTIME-INR -  Abnormal; Notable for the following:    Prothrombin Time 27.2 (*)    INR 2.50 (*)    All other components within normal limits  I-STAT CHEM 8, ED - Abnormal; Notable for the following:    Sodium 135 (*)    BUN 42 (*)    Creatinine, Ser 2.60 (*)    Glucose, Bld 135 (*)    Calcium, Ion 1.12 (*)    All other components within normal limits  I-STAT TROPOININ, ED    Imaging Review Nm Pulmonary Perf And Vent  12/01/2013   CLINICAL DATA:  LEFT-sided chest pain. Short of breath. Chest pain with breathing. Chest pain on breathing R07.1 (ICD-10-CM)  EXAM: NUCLEAR MEDICINE VENTILATION - PERFUSION LUNG SCAN  TECHNIQUE: Ventilation images were obtained in multiple projections using inhaled aerosol technetium 99 M DTPA. Perfusion images were obtained in multiple projections after intravenous injection of Tc-74m MAA.  RADIOPHARMACEUTICALS:  40.0 mCi Tc-61m DTPA aerosol and 6.0 mCi Tc-79m MAA  COMPARISON:  Chest radiograph today.  FINDINGS: Ventilation: Heterogeneous ventilation with clumping of radiotracer error in the second order bronchi.  Perfusion: No wedge shaped peripheral perfusion defects to suggest acute pulmonary embolism. Perfusion appears normal.  IMPRESSION: Low probability of pulmonary embolus.   Electronically Signed   By: Dereck Ligas M.D.   On: 12/01/2013 17:19    Dg Chest Port 1 View  12/01/2013   CLINICAL DATA:  Neck and back pain for 3 days  EXAM: PORTABLE CHEST - 1 VIEW  COMPARISON:  04/11/2010  FINDINGS: Cardiac shadow is enlarged. Postsurgical changes are again seen. No focal infiltrate or sizable effusion is seen. No acute bony abnormality is noted.  IMPRESSION: No acute abnormality noted.   Electronically Signed   By: Inez Catalina M.D.   On: 12/01/2013 14:56     EKG Interpretation   Date/Time:  Monday December 01 2013 13:59:06 EST Ventricular Rate:  78 PR Interval:  180 QRS Duration: 140 QT Interval:  416 QTC Calculation: 474 R Axis:   -41 Text Interpretation:  Sinus rhythm RBBB and LAFB Since last tracing rate  faster Confirmed by Clairessa Boulet  MD, Rain Friedt [id number]) on 12/01/2013 2:38:19 PM      MDM   Final diagnoses:  Chest pain  Back pain    Nonspecific upper back pain, with known DVT leg.  Patient required additional evaluation in  ED, prior to disposition.   Nursing Notes Reviewed/ Care Coordinated, and agree without changes. Applicable Imaging Reviewed.  Interpretation of Laboratory Data incorporated into ED treatment  Plan: Care to Dr. Dina Rich to evaluate prior to discharge, after turn of imaging  Richarda Blade, MD 12/02/13 1724

## 2013-12-01 NOTE — ED Notes (Signed)
Pt sent here by doctor R/O PE. sts mid back pain and pain that radiates between shoulder blades. sts hard to stand due to pain. Denies pai with breathing.

## 2013-12-01 NOTE — ED Provider Notes (Signed)
Signed out by Dr. Eulis Foster pending imaging.  Follow-up MRI and VQ scan reassuring. No evidence of spinal lesion or acute cord compression.  Low probability VQ scan. On reevaluation, patient reports some improvement with pain medication of the left back pain. Reports it is worse with movement. Patient was noted to have temperature 100.1. Repeat rectally 102.1. Urinalysis added. Patient denies any infectious symptoms including cough, dysuria, rhinorrhea, generalized myalgias. Urinalysis without evidence of overt infection. No consolidation on x-ray.  Discussed workup with the family. Patient has follow-up in one day with his primary physician. At this time origin of fever unknown but could be related to known DVT.  Otherwise kidney function is at baseline. Back and scapular pain has characteristics of musculoskeletal pain. No evidence of widening mediastinum on chest x-ray and clinically have low suspicion for aortic dissection.  Family is concerned given new constellation of symptoms and fever. They're requesting admission. Discussed with them at this time patient may not meet inpatient criteria; however, Dr. Alcario Drought to assess.  Patient offered observation admission. At this time no infectious source.  12:29 AM  Upon discharge, patient had syncopal episode witnessed by nursing. On my evaluation, patient coming to. Does not recall syncopal event.  No prodrome. Denies chest pain. Reports improvement of his back pain. CBG 152.  Nonfocal on exam. EKG shows right bundle branch block which is unchanged from prior. Have ordered bilateral upper extremity blood pressures.  Dr. Alcario Drought paged for reassessment and admission.   Merryl Hacker, MD 12/02/13 [phone]

## 2013-12-01 NOTE — ED Notes (Addendum)
Patient not in room at this time, having test done. Family sitting in room.

## 2013-12-01 NOTE — ED Notes (Signed)
Pt transported to VQ scan. 

## 2013-12-01 NOTE — ED Notes (Signed)
Patient transported to MRI 

## 2013-12-01 NOTE — Discharge Instructions (Signed)
You were seen today for back pain.  You were also noted to have fever. 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 All workup is reassuring including MRI, workup for pulmonary embolism. You do need to be evaluated and discuss anticoagulation with her primary doctor. Given her renal function, Eliquis may not be the most appropriate medication.   Your back pain appears to be somewhat musculoskeletal. He will be given pain medication. If you have any new or worsening symptoms including worsening pain, chest pain, infectious symptoms he should be reevaluated.  Deep Vein Thrombosis A deep vein thrombosis (DVT) is a blood clot that develops in the deep, larger veins of the leg, arm, or pelvis. These are more dangerous than clots that might form in veins near the surface of the body. A DVT can lead to serious and even life-threatening complications if the clot breaks off and travels in the bloodstream to the lungs.  A DVT can damage the valves in your leg veins so that instead of flowing upward, the blood pools in the lower leg. This is called post-thrombotic syndrome, and it can result in pain, swelling, discoloration, and sores on the leg. CAUSES Usually, several things contribute to the formation of blood clots. Contributing factors include:  The flow of blood slows down.  The inside of the vein is damaged in some way.  You have a condition that makes blood clot more easily. RISK FACTORS Some people are more likely than others to develop blood clots. Risk factors include:   Smoking.  Being overweight (obese).  Sitting or lying still for a long time. This includes long-distance travel, paralysis, or recovery from an illness or surgery. Other factors that increase risk are:   Older age, especially over 27 years of age.  Having a family history of blood clots or if you have already had a blot clot.  Having major or lengthy surgery. This is especially true for surgery on the hip, knee, or belly  (abdomen). Hip surgery is particularly high risk.  Having a long, thin tube (catheter) placed inside a vein during a medical procedure.  Breaking a hip or leg.  Having cancer or cancer treatment.  Pregnancy and childbirth.  Hormone changes make the blood clot more easily during pregnancy.  The fetus puts pressure on the veins of the pelvis.  There is a risk of injury to veins during delivery or a caesarean delivery. The risk is highest just after childbirth.  Medicines containing the male hormone estrogen. This includes birth control pills and hormone replacement therapy.  Other circulation or heart problems.  SIGNS AND SYMPTOMS When a clot forms, it can either partially or totally block the blood flow in that vein. Symptoms of a DVT can include:  Swelling of the leg or arm, especially if one side is much worse.  Warmth and redness of the leg or arm, especially if one side is much worse.  Pain in an arm or leg. If the clot is in the leg, symptoms may be more noticeable or worse when standing or walking. The symptoms of a DVT that has traveled to the lungs (pulmonary embolism, PE) usually start suddenly and include:  Shortness of breath.  Coughing.  Coughing up blood or blood-tinged mucus.  Chest pain. The chest pain is often worse with deep breaths.  Rapid heartbeat. Anyone with these symptoms should get emergency medical treatment right away. Do not wait to see if the symptoms will go away. Call your local emergency services (  911 in the [place] if you have these symptoms. Do not drive yourself to the hospital. DIAGNOSIS If a DVT is suspected, your health care provider will take a full medical history and perform a physical exam. Tests that also may be required include:  Blood tests, including studies of the clotting properties of the blood.  Ultrasound to see if you have clots in your legs or lungs.  X-rays to show the flow of blood when dye is injected into the veins  (venogram).  Studies of your lungs if you have any chest symptoms. PREVENTION  Exercise the legs regularly. Take a brisk 30-minute walk every day.  Maintain a weight that is appropriate for your height.  Avoid sitting or lying in bed for long periods of time without moving your legs.  Women, particularly those over the age of 69 years, should consider the risks and benefits of taking estrogen medicines, including birth control pills.  Do not smoke, especially if you take estrogen medicines.  Long-distance travel can increase your risk of DVT. You should exercise your legs by walking or pumping the muscles every hour.  Many of the risk factors above relate to situations that exist with hospitalization, either for illness, injury, or elective surgery. Prevention may include medical and nonmedical measures.  Your health care provider will assess you for the need for venous thromboembolism prevention when you are admitted to the hospital. If you are having surgery, your surgeon will assess you the day of or day after surgery. TREATMENT Once identified, a DVT can be treated. It can also be prevented in some circumstances. Once you have had a DVT, you may be at increased risk for a DVT in the future. The most common treatment for DVT is blood-thinning (anticoagulant) medicine, which reduces the blood's tendency to clot. Anticoagulants can stop new blood clots from forming and stop old clots from growing. They cannot dissolve existing clots. Your body does this by itself over time. Anticoagulants can be given by mouth, through an IV tube, or by injection. Your health care provider will determine the best program for you. Other medicines or treatments that may be used are:  Heparin or related medicines (low molecular weight heparin) are often the first treatment for a blood clot. They act quickly. However, they cannot be taken orally and must be given either in shot form or by IV tube.  Heparin can  cause a fall in a component of blood that stops bleeding and forms blood clots (platelets). You will be monitored with blood tests to be sure this does not occur.  Warfarin is an anticoagulant that can be swallowed. It takes a few days to start working, so usually heparin or related medicines are used in combination. Once warfarin is working, heparin is usually stopped.  Factor Xa inhibitor medicines, such as rivaroxaban and apixaban, also reduce blood clotting. These medicines are taken orally and can often be used without heparin or related medicines.  Less commonly, clot dissolving drugs (thrombolytics) are used to dissolve a DVT. They carry a high risk of bleeding, so they are used mainly in severe cases where your life or a part of your body is threatened.  Very rarely, a blood clot in the leg needs to be removed surgically.  If you are unable to take anticoagulants, your health care provider may arrange for you to have a filter placed in a main vein in your abdomen. This filter prevents clots from traveling to your lungs. HOME CARE INSTRUCTIONS  Take all medicines as directed by your health care provider.  Learn as much as you can about DVT.  Wear a medical alert bracelet or carry a medical alert card.  Ask your health care provider how soon you can go back to normal activities. It is important to stay active to prevent blood clots. If you are on anticoagulant medicine, avoid contact sports.  It is very important to exercise. This is especially important while traveling, sitting, or standing for long periods of time. Exercise your legs by walking or by tightening and relaxing your leg muscles regularly. Take frequent walks.  You may need to wear compression stockings. These are tight elastic stockings that apply pressure to the lower legs. This pressure can help keep the blood in the legs from clotting. Taking Warfarin Warfarin is a daily medicine that is taken by mouth. Your health  care provider will advise you on the length of treatment (usually 3-6 months, sometimes lifelong). If you take warfarin:  Understand how to take warfarin and foods that can affect how warfarin works in Veterinary surgeon.  Too much and too little warfarin are both dangerous. Too much warfarin increases the risk of bleeding. Too little warfarin continues to allow the risk for blood clots. Warfarin and Regular Blood Testing While taking warfarin, you will need to have regular blood tests to measure your blood clotting time. These blood tests usually include both the prothrombin time (PT) and international normalized ratio (INR) tests. The PT and INR results allow your health care provider to adjust your dose of warfarin. It is very important that you have your PT and INR tested as often as directed by your health care provider.  Warfarin and Your Diet Avoid major changes in your diet, or notify your health care provider before changing your diet. Arrange a visit with a registered dietitian to answer your questions. Many foods, especially foods high in vitamin K, can interfere with warfarin and affect the PT and INR results. You should eat a consistent amount of foods high in vitamin K. Foods high in vitamin K include:   Spinach, kale, broccoli, cabbage, collard and turnip greens, Brussels sprouts, peas, cauliflower, seaweed, and parsley.  Beef and pork liver.  Green tea.  Soybean oil. Warfarin with Other Medicines Many medicines can interfere with warfarin and affect the PT and INR results. You must:  Tell your health care provider about any and all medicines, vitamins, and supplements you take, including aspirin and other over-the-counter anti-inflammatory medicines. Be especially cautious with aspirin and anti-inflammatory medicines. Ask your health care provider before taking these.  Do not take or discontinue any prescribed or over-the-counter medicine except on the advice of your health care  provider or pharmacist. Warfarin Side Effects Warfarin can have side effects, such as easy bruising and difficulty stopping bleeding. Ask your health care provider or pharmacist about other side effects of warfarin. You will need to:  Hold pressure over cuts for longer than usual.  Notify your dentist and other health care providers that you are taking warfarin before you undergo any procedures where bleeding may occur. Warfarin with Alcohol and Tobacco   Drinking alcohol frequently can increase the effect of warfarin, leading to excess bleeding. It is best to avoid alcoholic drinks or to consume only very small amounts while taking warfarin. Notify your health care provider if you change your alcohol intake.   Do not use any tobacco products including cigarettes, chewing tobacco, or electronic cigarettes. If you smoke, quit.  Ask your health care provider for help with quitting smoking. Alternative Medicines to Warfarin: Factor Xa Inhibitor Medicines  These blood-thinning medicines are taken by mouth, usually for several weeks or longer. It is important to take the medicine every single day at the same time each day.  There are no regular blood tests required when using these medicines.  There are fewer food and drug interactions than with warfarin.  The side effects of this class of medicine are similar to those of warfarin, including excessive bruising or bleeding. Ask your health care provider or pharmacist about other potential side effects. SEEK MEDICAL CARE IF:  You notice a rapid heartbeat.  You feel weaker or more tired than usual.  You feel faint.  You notice increased bruising.  You feel your symptoms are not getting better in the time expected.  You believe you are having side effects of medicine. SEEK IMMEDIATE MEDICAL CARE IF:  You have chest pain.  You have trouble breathing.  You have new or increased swelling or pain in one leg.  You cough up blood.  You  notice blood in vomit, in a bowel movement, or in urine. MAKE SURE YOU:  Understand these instructions.  Will watch your condition.  Will get help right away if you are not doing well or get worse. Document Released: 12/26/2004 Document Revised: 05/12/2013 Document Reviewed: 09/02/2012 [facility] Patient Information 2015 [place], [place]. This information is not intended to replace advice given to you by your health care provider. Make sure you discuss any questions you have with your health care provider.

## 2013-12-02 DIAGNOSIS — I82402 Acute embolism and thrombosis of unspecified deep veins of left lower extremity: Secondary | ICD-10-CM | POA: Diagnosis present

## 2013-12-02 DIAGNOSIS — R55 Syncope and collapse: Secondary | ICD-10-CM | POA: Diagnosis present

## 2013-12-02 DIAGNOSIS — M109 Gout, unspecified: Secondary | ICD-10-CM | POA: Diagnosis not present

## 2013-12-02 DIAGNOSIS — G4733 Obstructive sleep apnea (adult) (pediatric): Secondary | ICD-10-CM | POA: Diagnosis present

## 2013-12-02 DIAGNOSIS — I2510 Atherosclerotic heart disease of native coronary artery without angina pectoris: Secondary | ICD-10-CM | POA: Diagnosis present

## 2013-12-02 DIAGNOSIS — E669 Obesity, unspecified: Secondary | ICD-10-CM | POA: Diagnosis present

## 2013-12-02 DIAGNOSIS — I2584 Coronary atherosclerosis due to calcified coronary lesion: Secondary | ICD-10-CM

## 2013-12-02 DIAGNOSIS — N189 Chronic kidney disease, unspecified: Secondary | ICD-10-CM | POA: Diagnosis present

## 2013-12-02 DIAGNOSIS — I4510 Unspecified right bundle-branch block: Secondary | ICD-10-CM | POA: Diagnosis present

## 2013-12-02 DIAGNOSIS — R509 Fever, unspecified: Secondary | ICD-10-CM | POA: Diagnosis present

## 2013-12-02 DIAGNOSIS — M6008 Infective myositis, other site: Secondary | ICD-10-CM | POA: Diagnosis present

## 2013-12-02 DIAGNOSIS — Z955 Presence of coronary angioplasty implant and graft: Secondary | ICD-10-CM | POA: Diagnosis not present

## 2013-12-02 DIAGNOSIS — E785 Hyperlipidemia, unspecified: Secondary | ICD-10-CM | POA: Diagnosis present

## 2013-12-02 DIAGNOSIS — Z951 Presence of aortocoronary bypass graft: Secondary | ICD-10-CM | POA: Diagnosis not present

## 2013-12-02 DIAGNOSIS — Z7901 Long term (current) use of anticoagulants: Secondary | ICD-10-CM | POA: Diagnosis not present

## 2013-12-02 DIAGNOSIS — N289 Disorder of kidney and ureter, unspecified: Secondary | ICD-10-CM

## 2013-12-02 DIAGNOSIS — N400 Enlarged prostate without lower urinary tract symptoms: Secondary | ICD-10-CM | POA: Diagnosis present

## 2013-12-02 DIAGNOSIS — I129 Hypertensive chronic kidney disease with stage 1 through stage 4 chronic kidney disease, or unspecified chronic kidney disease: Secondary | ICD-10-CM | POA: Diagnosis present

## 2013-12-02 DIAGNOSIS — N179 Acute kidney failure, unspecified: Secondary | ICD-10-CM | POA: Insufficient documentation

## 2013-12-02 DIAGNOSIS — Z8249 Family history of ischemic heart disease and other diseases of the circulatory system: Secondary | ICD-10-CM | POA: Diagnosis not present

## 2013-12-02 DIAGNOSIS — Z87891 Personal history of nicotine dependence: Secondary | ICD-10-CM | POA: Diagnosis not present

## 2013-12-02 DIAGNOSIS — I379 Nonrheumatic pulmonary valve disorder, unspecified: Secondary | ICD-10-CM

## 2013-12-02 DIAGNOSIS — E860 Dehydration: Secondary | ICD-10-CM | POA: Diagnosis present

## 2013-12-02 DIAGNOSIS — M549 Dorsalgia, unspecified: Secondary | ICD-10-CM | POA: Diagnosis present

## 2013-12-02 LAB — CBC
HCT: 35.4 % — ABNORMAL LOW (ref 39.0–52.0)
Hemoglobin: 11.7 g/dL — ABNORMAL LOW (ref 13.0–17.0)
MCH: 30.4 pg (ref 26.0–34.0)
MCHC: 33.1 g/dL (ref 30.0–36.0)
MCV: 91.9 fL (ref 78.0–100.0)
Platelets: 146 K/uL — ABNORMAL LOW (ref 150–400)
RBC: 3.85 MIL/uL — ABNORMAL LOW (ref 4.22–5.81)
RDW: 13 % (ref 11.5–15.5)
WBC: 11 K/uL — ABNORMAL HIGH (ref 4.0–10.5)

## 2013-12-02 LAB — BASIC METABOLIC PANEL WITH GFR
Anion gap: 18 — ABNORMAL HIGH (ref 5–15)
BUN: 42 mg/dL — ABNORMAL HIGH (ref 6–23)
CO2: 22 meq/L (ref 19–32)
Calcium: 8.6 mg/dL (ref 8.4–10.5)
Chloride: 97 meq/L (ref 96–112)
Creatinine, Ser: 2.06 mg/dL — ABNORMAL HIGH (ref 0.50–1.35)
GFR calc Af Amer: 35 mL/min — ABNORMAL LOW (ref >90)
GFR calc non Af Amer: 30 mL/min — ABNORMAL LOW (ref >90)
Glucose, Bld: 138 mg/dL — ABNORMAL HIGH (ref 70–99)
Potassium: 3.5 meq/L — ABNORMAL LOW (ref 3.7–5.3)
Sodium: 137 meq/L (ref 137–147)

## 2013-12-02 LAB — TROPONIN I
Troponin I: <0.3 ng/mL (ref <0.30)
Troponin I: <0.3 ng/mL (ref <0.30)
Troponin I: <0.3 ng/mL (ref <0.30)
Troponin I: <0.3 ng/mL (ref <0.30)

## 2013-12-02 LAB — CBG MONITORING, ED: Glucose-Capillary: 152 mg/dL — ABNORMAL HIGH (ref 70–99)

## 2013-12-02 MED ORDER — ATORVASTATIN CALCIUM 10 MG PO TABS
10.0000 mg | ORAL_TABLET | Freq: Every day | ORAL | Status: DC
  Administered 2013-12-02 – 2013-12-09 (×8): 10 mg via ORAL
  Filled 2013-12-02 (×8): qty 1

## 2013-12-02 MED ORDER — MORPHINE SULFATE 2 MG/ML IJ SOLN
2.0000 mg | INTRAMUSCULAR | Status: DC | PRN
  Administered 2013-12-02 (×2): 2 mg via INTRAVENOUS
  Filled 2013-12-02 (×2): qty 1

## 2013-12-02 MED ORDER — SODIUM CHLORIDE 0.9 % IJ SOLN
3.0000 mL | Freq: Two times a day (BID) | INTRAMUSCULAR | Status: DC
  Administered 2013-12-02 – 2013-12-06 (×10): 3 mL via INTRAVENOUS

## 2013-12-02 MED ORDER — METOPROLOL TARTRATE 25 MG PO TABS
25.0000 mg | ORAL_TABLET | Freq: Two times a day (BID) | ORAL | Status: DC
  Administered 2013-12-02 – 2013-12-09 (×16): 25 mg via ORAL
  Filled 2013-12-02 (×18): qty 1

## 2013-12-02 MED ORDER — SODIUM CHLORIDE 0.9 % IV SOLN
INTRAVENOUS | Status: DC
  Administered 2013-12-02: 03:00:00 via INTRAVENOUS

## 2013-12-02 MED ORDER — HYDROCODONE-ACETAMINOPHEN 5-325 MG PO TABS
1.0000 | ORAL_TABLET | Freq: Four times a day (QID) | ORAL | Status: DC | PRN
  Administered 2013-12-02: 2 via ORAL
  Filled 2013-12-02: qty 2

## 2013-12-02 MED ORDER — HYDROCODONE-ACETAMINOPHEN 5-325 MG PO TABS
2.0000 | ORAL_TABLET | ORAL | Status: DC | PRN
  Administered 2013-12-02 – 2013-12-05 (×8): 2 via ORAL
  Filled 2013-12-02 (×8): qty 2

## 2013-12-02 MED ORDER — TAMSULOSIN HCL 0.4 MG PO CAPS
0.4000 mg | ORAL_CAPSULE | Freq: Every day | ORAL | Status: DC
  Administered 2013-12-02 – 2013-12-09 (×8): 0.4 mg via ORAL
  Filled 2013-12-02 (×8): qty 1

## 2013-12-02 MED ORDER — FAMOTIDINE 20 MG PO TABS
20.0000 mg | ORAL_TABLET | Freq: Every day | ORAL | Status: DC
  Administered 2013-12-02 – 2013-12-08 (×8): 20 mg via ORAL
  Filled 2013-12-02 (×9): qty 1

## 2013-12-02 NOTE — Clinical Documentation Improvement (Signed)
  [name] was admitted for syncope potentially due to dehydration. "Chronic Renal Insufficiency" is documented in the progress notes. If possible could you provide greater specificity for this diagnosis?    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   Almon Register, RN Clinical Documentation Improvement Specialist ([phone] / [phone]

## 2013-12-02 NOTE — Progress Notes (Signed)
Utilization review completed.  

## 2013-12-02 NOTE — ED Notes (Signed)
Pt states he is hungry from not eating and taking pain medication. Given crackers and gingerale. Pt drinking gingerale. Pt appears to be weak but states it is probably because he has not eaten. Pt stood with minimal assistance and got dressed. Placed in wheelchair, family member left to get the car, wife at bedside assisting.

## 2013-12-02 NOTE — Progress Notes (Signed)
[facility] PROGRESS NOTE Assessment/Plan: Syncope: - unclear etiology. Most likely pre-renal. Cr improving with IV fluids. - Held ACE-I and lasix on admission. - Cardiac markers negative. - V/q scan low probability. No events on telemetry. - Echo pending  Fever of unclear etiology: - ? Due to DVT. - Use tylenol. - BCx, urine CX are pending, source unclear, MRI of C and T spine was negative. - no further fevers.  Left leg DVT: - cont eliquis. - GFR. 30.  Chronic Renal insufficiency:  - chronic, stable, and patient has appointment with nephrologist early next month  OSA on CPAP - cont cpap.   Code Status: full Family Communication: none  Disposition Plan: inpatient   Consultants:  none  Procedures:  V/q scan  echo  Antibiotics:  None  HPI/Subjective: Relates back and now hip pain.  Objective: Filed Vitals:   12/02/13 0115 12/02/13 0138 12/02/13 0305 12/02/13 0528  BP: 134/58 129/60  117/46  Pulse: 82 83 82 64  Temp:  99.2 F (37.3 C)  98.7 F (37.1 C)  TempSrc:  Oral  Oral  Resp:  17 18 18   Height:  5\' 3"  (1.6 m)    Weight:  93.2 kg (205 lb 7.5 oz)    SpO2: 95% 95% 92% 94%   No intake or output data in the 24 hours ending 12/02/13 0843 Filed Weights   12/02/13 0138  Weight: 93.2 kg (205 lb 7.5 oz)    Exam:  General: Alert, awake, oriented x3, in no acute distress.  HEENT: No bruits, no goiter.  Heart: Regular rate and rhythm. Lungs: Good air movement, clear Abdomen: Soft, nontender, nondistended, positive bowel sounds.     Data Reviewed: Basic Metabolic Panel:  Recent Labs Lab 12/01/13 1408 12/02/13 0113  NA 135* 137  K 3.7 3.5*  CL 97 97  CO2  --  22  GLUCOSE 135* 138*  BUN 42* 42*  CREATININE 2.60* 2.06*  CALCIUM  --  8.6   Liver Function Tests: No results for input(s): AST, ALT, ALKPHOS, BILITOT, PROT, ALBUMIN in the last 168 hours. No results for input(s): LIPASE, AMYLASE in the last 168 hours. No results  for input(s): AMMONIA in the last 168 hours. CBC:  Recent Labs Lab 12/01/13 1340 12/01/13 1408 12/02/13 0113  WBC 11.3*  --  11.0*  NEUTROABS 9.3*  --   --   HGB 13.8 15.3 11.7*  HCT 42.0 45.0 35.4*  MCV 92.7  --  91.9  PLT 156  --  146*   Cardiac Enzymes:  Recent Labs Lab 12/02/13 0030 12/02/13 0113 12/02/13 0649  TROPONINI <0.30 <0.30 <0.30   BNP (last 3 results) No results for input(s): PROBNP in the last 8760 hours. CBG:  Recent Labs Lab 12/02/13 0023  GLUCAP 152*    No results found for this or any previous visit (from the past 240 hour(s)).   Studies: Mr Cervical Spine Wo Contrast  12/01/2013   CLINICAL DATA:  Mid back pain radiating to shoulders for 2 days, no numbness or tingling in extremities.  EXAM: MRI CERVICAL AND THORACIC SPINE WITHOUT CONTRAST  TECHNIQUE: Multiplanar and multiecho pulse sequences of the cervical spine, to include the craniocervical junction and cervicothoracic junction, and thoracic spine, were obtained without intravenous contrast.  COMPARISON:  None.  FINDINGS: MRI CERVICAL SPINE FINDINGS  Cervical vertebral bodies and posterior elements are intact and aligned with maintenance of the cervical lordosis. 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 Intervertebral disc morphology generally preserved with decreased T2 signal within all cervical disc consistent with moderate desiccation. Moderate chronic discogenic endplate changes [id number] through C6-7. No STIR signal abnormality to suggest acute osseous process.  Cervical spinal cord appears normal morphology and signal characteristics from the cervicomedullary junction to the level of T1-2, with the most caudal well visualized level. Craniocervical junction is intact. Prevertebral paraspinal soft tissues are nonsuspicious. Post signal probable pannus about the odontoid process can be seen with CPPD.  Level by level evaluation:  C2-3:  No disc bulge. Uncovertebral hypertrophy. Moderate to severe LEFT and moderate RIGHT facet arthropathy without superimposed canal stenosis. Mild to moderate LEFT neural foraminal narrowing.  C3-4: No disc bulge. Uncovertebral hypertrophy and mild to moderate facet arthropathy without superimposed canal stenosis. Mild RIGHT neural foraminal narrowing.  C4-5: 22 mm annular bulging, uncovertebral hypertrophy and mild facet arthropathy. Mild canal stenosis. Mild RIGHT greater LEFT neural foraminal narrowing.  C5-6: 2 mm broad-based disc bulge asymmetric to the LEFT, uncovertebral hypertrophy and mild facet arthropathy. Moderate to severe canal stenosis with ventral and dorsal cord deformity. Moderate to severe RIGHT, severe LEFT neural foraminal narrowing.  C6-7: Annular bulging, uncovertebral hypertrophy and mild facet arthropathy. Moderate canal stenosis. Moderate to severe RIGHT, moderate LEFT neural foraminal narrowing.  C7-T1: Annular bulging, uncovertebral hypertrophy and mild facet arthropathy without canal stenosis. Moderate RIGHT, mild LEFT neural foraminal narrowing.  MRI THORACIC SPINE FINDINGS  Thoracic vertebral bodies and posterior elements are intact aligned and maintenance of thoracic kyphosis. Intervertebral disc morphology is preserved. RIGHT T1, T2 signal within T4-5, T5-6 and T7-8 disc consist with mineralization, and mildly desiccated remaining thoracic disc. Mild chronic discogenic endplate changes T 579FGE and to lesser extent the remaining lower thoracic vertebral bodies ventrally, with no STIR signal abnormality to suggest acute osseous process. Scattered chronic Schmorl's nodes.  Thoracic spinal cord appears normal morphology and signal characteristics, conus medullaris below the level L1, not imaged. Mild epidural lipomatosis. Median sternotomy noted. Possible cardiomegaly, incompletely characterized. Mild paraspinal muscle atrophy.  Mild facet arthropathy and focal epidural lipomatosis at  T1-2 results in mild effacement of thecal sac without osseous canal stenosis. Moderate lower lumbar facet arthropathy.  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  Neural foraminal narrowing at all cervical levels: Severe on the LEFT at C5-6.  MRI THORACIC SPINE  Degenerative change of the thoracic spine without acute fracture or malalignment.  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   Electronically Signed   By: Elon Alas   On: 12/01/2013 21:09   Mr Thoracic Spine Wo Contrast  12/01/2013   CLINICAL DATA:  Mid back pain radiating to shoulders for 2 days, no numbness or tingling in extremities.  EXAM: MRI CERVICAL AND THORACIC SPINE WITHOUT CONTRAST  TECHNIQUE: Multiplanar and multiecho pulse sequences of the cervical spine, to include the craniocervical junction and cervicothoracic junction, and thoracic spine, were obtained without intravenous contrast.  COMPARISON:  None.  FINDINGS: MRI CERVICAL SPINE FINDINGS  Cervical vertebral bodies and posterior elements are intact and aligned with maintenance of the cervical lordosis. 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 Intervertebral disc morphology generally preserved with decreased T2 signal within  all cervical disc consistent with moderate desiccation. Moderate chronic discogenic endplate changes [id number] through C6-7. No STIR signal abnormality to suggest acute osseous process.  Cervical spinal cord appears normal morphology and signal characteristics from the cervicomedullary junction to the level of T1-2, with the most caudal well visualized level. Craniocervical junction is intact. Prevertebral  paraspinal soft tissues are nonsuspicious. Post signal probable pannus about the odontoid process can be seen with CPPD.  Level by level evaluation:  C2-3: No disc bulge. Uncovertebral hypertrophy. Moderate to severe LEFT and moderate RIGHT facet arthropathy without superimposed canal stenosis. Mild to moderate LEFT neural foraminal narrowing.  C3-4: No disc bulge. Uncovertebral hypertrophy and mild to moderate facet arthropathy without superimposed canal stenosis. Mild RIGHT neural foraminal narrowing.  C4-5: 22 mm annular bulging, uncovertebral hypertrophy and mild facet arthropathy. Mild canal stenosis. Mild RIGHT greater LEFT neural foraminal narrowing.  C5-6: 2 mm broad-based disc bulge asymmetric to the LEFT, uncovertebral hypertrophy and mild facet arthropathy. Moderate to severe canal stenosis with ventral and dorsal cord deformity. Moderate to severe RIGHT, severe LEFT neural foraminal narrowing.  C6-7: Annular bulging, uncovertebral hypertrophy and mild facet arthropathy. Moderate canal stenosis. Moderate to severe RIGHT, moderate LEFT neural foraminal narrowing.  C7-T1: Annular bulging, uncovertebral hypertrophy and mild facet arthropathy without canal stenosis. Moderate RIGHT, mild LEFT neural foraminal narrowing.  MRI THORACIC SPINE FINDINGS  Thoracic vertebral bodies and posterior elements are intact aligned and maintenance of thoracic kyphosis. Intervertebral disc morphology is preserved. RIGHT T1, T2 signal within T4-5, T5-6 and T7-8 disc consist with mineralization, and mildly desiccated remaining thoracic disc. Mild chronic discogenic endplate changes T 579FGE and to lesser extent the remaining lower thoracic vertebral bodies ventrally, with no STIR signal abnormality to suggest acute osseous process. Scattered chronic Schmorl's nodes.  Thoracic spinal cord appears normal morphology and signal characteristics, conus medullaris below the level L1, not imaged. Mild epidural lipomatosis. Median  sternotomy noted. Possible cardiomegaly, incompletely characterized. Mild paraspinal muscle atrophy.  Mild facet arthropathy and focal epidural lipomatosis at T1-2 results in mild effacement of thecal sac without osseous canal stenosis. Moderate lower lumbar facet arthropathy.  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  Neural foraminal narrowing at all cervical levels: Severe on the LEFT at C5-6.  MRI THORACIC SPINE  Degenerative change of the thoracic spine without acute fracture or malalignment.  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   Electronically Signed   By: Elon Alas   On: 12/01/2013 21:09   Nm Pulmonary Perf And Vent  12/01/2013   CLINICAL DATA:  LEFT-sided chest pain. Short of breath. Chest pain with breathing. Chest pain on breathing R07.1 (ICD-10-CM)  EXAM: NUCLEAR MEDICINE VENTILATION - PERFUSION LUNG SCAN  TECHNIQUE: Ventilation images were obtained in multiple projections using inhaled aerosol technetium 99 M DTPA. Perfusion images were obtained in multiple projections after intravenous injection of Tc-83m MAA.  RADIOPHARMACEUTICALS:  40.0 mCi Tc-38m DTPA aerosol and 6.0 mCi Tc-37m MAA  COMPARISON:  Chest radiograph today.  FINDINGS: Ventilation: Heterogeneous ventilation with clumping of radiotracer error in the second order bronchi.  Perfusion: No wedge shaped peripheral perfusion defects to suggest acute pulmonary embolism. Perfusion appears normal.  IMPRESSION: Low probability of pulmonary embolus.   Electronically Signed   By: Cay Schillings  Lamke M.D.   On: 12/01/2013 17:19   Dg Chest Port 1 View  12/01/2013   CLINICAL DATA:  Neck and back pain for 3 days  EXAM: PORTABLE CHEST - 1 VIEW   COMPARISON:  04/11/2010  FINDINGS: Cardiac shadow is enlarged. Postsurgical changes are again seen. No focal infiltrate or sizable effusion is seen. No acute bony abnormality is noted.  IMPRESSION: No acute abnormality noted.   Electronically Signed   By: Inez Catalina M.D.   On: 12/01/2013 14:56    Scheduled Meds: . apixaban  10 mg Oral BID  . atorvastatin  10 mg Oral Daily  . famotidine  20 mg Oral QHS  . metoprolol  25 mg Oral BID  . sodium chloride  3 mL Intravenous Q12H  . tamsulosin  0.4 mg Oral Daily   Continuous Infusions: . sodium chloride 100 mL/hr at 12/02/13 0245     Charlynne Cousins  [facility] Pager [phone]. If 8PM-8AM, please contact night-coverage at [email or web address], password [facility] 12/02/2013, 8:43 AM  LOS: 1 day

## 2013-12-02 NOTE — Progress Notes (Signed)
Placed patient on CPAP auto mode 5-20cmH20.  Patient is tolerating well at this time.

## 2013-12-02 NOTE — Evaluation (Signed)
Physical Therapy Evaluation Patient Details Name: Paul Price MRN: [id number] DOB: 03-Mar-1940 Today's Date: 12/02/2013   History of Present Illness  Patient is a 73 y/o male admitted with fever, pain and syncope.  PMH includes HTN, CAD, CABG, LBBB, and recent LLE DVT treated with Eliquis.  Clinical Impression  Patient presents with decreased independence with mobility due to deficits listed in PT problem list. He will benefit from skilled PT in the acute setting to allow return home with family and HHPT.    Follow Up Recommendations Home health PT;Supervision/Assistance - 24 hour    Equipment Recommendations  Rolling walker with 5" wheels    Recommendations for Other Services       Precautions / Restrictions Precautions Precautions: Fall      Mobility  Bed Mobility Overal bed mobility: Needs Assistance Bed Mobility: Rolling;Sidelying to Sit Rolling: Mod assist Sidelying to sit: Mod assist       General bed mobility comments: cues for technique, assist due to severe pain  Transfers Overall transfer level: Needs assistance Equipment used: 1 person hand held assist;Rolling walker (2 wheeled) Transfers: Sit to/from Stand Sit to Stand: Min assist;Mod assist         General transfer comment: increased time and initial difficulty to rise due to pain; improved with multiple attempts.  Ambulation/Gait Ambulation/Gait assistance: Min assist Ambulation Distance (Feet): 22 Feet (and 3') Assistive device: Rolling walker (2 wheeled);1 person hand held assist Gait Pattern/deviations: Step-through pattern;Shuffle;Decreased stride length     General Gait Details: initally trying to ambulate with HHA, difficulty due to pain and weakness, so with walker able to walk to door with chair following  Stairs            Wheelchair Mobility    Modified Rankin (Stroke Patients Only)       Balance Overall balance assessment: Needs assistance   Sitting balance-[name]  Scale: Good Sitting balance - Comments: able to use urinal independent sitting edge of bed   Standing balance support: Bilateral upper extremity supported Standing balance-[name] Scale: Poor Standing balance comment: off balance and in pain and unable to take steps without both hands supported                             Pertinent Vitals/Pain Pain Assessment: 0-10 Pain Score: 10-Worst pain ever Pain Location: left scapula and right flank Pain Descriptors / Indicators: Sharp Pain Intervention(s): Monitored during session;Repositioned;Limited activity within patient's tolerance;Patient requesting pain meds-RN notified    Home Living Family/patient expects to be discharged to:: Private residence Living Arrangements: Spouse/significant other Available Help at Discharge: Family Type of Home: House Home Access: Stairs to enter Entrance Stairs-Rails: Right Entrance Stairs-Number of Steps: 3 Home Layout: One level [place]: [place] - single point      Prior Function Level of Independence: Independent with assistive device(s)         Comments: reports has had difficulty moving since Saturday due to pain in left upper back     Hand Dominance        Extremity/Trunk Assessment               Lower Extremity Assessment: Generalized weakness         Communication   Communication: No difficulties  Cognition Arousal/Alertness: Awake/alert Behavior During Therapy: WFL for tasks assessed/performed Overall Cognitive Status: Within Functional Limits for tasks assessed  General Comments      Exercises        Assessment/Plan    PT Assessment Patient needs continued PT services  PT Diagnosis Difficulty walking;Generalized weakness   PT Problem List Decreased strength;Decreased activity tolerance;Decreased balance;Decreased mobility;Pain;Decreased knowledge of use of DME;Decreased safety awareness  PT Treatment Interventions  DME instruction;Gait training;Stair training;Balance training;Therapeutic exercise;Functional mobility training;Therapeutic activities;Patient/family education   PT Goals (Current goals can be found in the Care Plan section) Acute Rehab PT Goals Patient Stated Goal: To get stronger PT Goal Formulation: With patient Time For Goal Achievement: 12/16/13 Potential to Achieve Goals: Good    Frequency Min 3X/week   Barriers to discharge        Co-evaluation               End of Session Equipment Utilized During Treatment: Gait belt Activity Tolerance: Patient limited by pain;Patient limited by fatigue Patient left: in chair;with call bell/phone within reach;with family/visitor present Nurse Communication: Patient requests pain meds         Time: 1205-1232 PT Time Calculation (min) (ACUTE ONLY): 27 min   Charges:   PT Evaluation $Initial PT Evaluation Tier I: 1 Procedure PT Treatments $Gait Training: 8-22 mins   PT G Codes:          Siddhanth Denk,CYNDI 12/13/2013, 1:33 PM Magda Kiel, [place] 2013-12-13

## 2013-12-02 NOTE — ED Notes (Signed)
Pt came to and states the last thing he remembers is drinking gingerale. Pt is alert and oriented at present.

## 2013-12-02 NOTE — Progress Notes (Signed)
Echo Lab  2D Echocardiogram completed.  Garron Eline L Mallerie Blok, RDCS 12/02/2013 10:13 AM

## 2013-12-02 NOTE — H&P (Signed)
[facility] History and Physical  Paul Price [id number] DOB: [birth year] DOA: 12/01/2013  Referring physician: EDP PCP: Horatio Pel, MD   Chief Complaint: Back pain   HPI: Paul Price is a 73 y.o. male h/o CAD who presents to the ED with c/o L scapular pain.  Symptoms onset suddenly 3 days ago, persistent and unrelenting.  Cough makes pain worse, pain is much worse with movement, better at rest, he is currently being treated for LLE DVT with elliquis for past 1 week.  Called PCP this morning and sent to ED for eval.  During work up in ED he was noted to have temp of 102.  MRI of T and C spine showed severe stenosis but this likely chronic and patient having no neurologic symptoms.  UA was negative, work up for source of fever has been negative thus far other than his DVT, BCx pending, NM study for PE was low-probability for PE.  Could not do CT angio of chest due to chronic renal insufficiency.  He was going to be discharged but he had a syncopal episode in the ED when he stood up to leave.  States he is very dehydrated after not eating or drinking anything after being in ED for several hours.  Review of Systems: No stigmata of GIB, no cough, no SOB, Systems reviewed.  As above, otherwise negative  Past Medical History  Diagnosis Date  . Hypertension   . Coronary artery disease   . History of percutaneous left heart catheterization 04/2010  . Obesity   . OSA on CPAP   . Hx of echocardiogram 05/2009    EF 40-45%, he did have mild annular calcification with mild-to-moderate MR and mild TR as well as aortic valve sclerosis. Estimated RV systolic pressure was 21 mm.  . History of stress test 06/2011    No significant ischemia, this is a low risk scan. Clinical correlation recommended Abnormal myocardial perfusion study.  Marland Kitchen RBBB   . BPH (benign prostatic hyperplasia)    Past Surgical History  Procedure Laterality Date  . Coronary angioplasty with stent placement   April 2012    LAD DES  . Coronary artery bypass graft  1992    with LIMA to the LAD and diagonal.  . Cardiac catheterization  2009    he was found to have a atretic LIMA graft to his LAD and had a patent vein graft supplying his diagonal vessel. At that time, he had 70% narrowing in his LAD beyond a diagonal vessel which was initially treated medically.   Social History:  reports that he has quit smoking. He has never used smokeless tobacco. He reports that he does not drink alcohol or use illicit drugs.  Allergies  Allergen Reactions  . Nitrostat [Nitroglycerin]     Causes blood pressure to "bottom out"    Family History  Problem Relation Age of Onset  . Heart disease Father      Prior to Admission medications   Medication Sig Start Date End Date Taking? Authorizing Provider  apixaban (ELIQUIS) 5 MG TABS tablet Take 10 mg by mouth 2 (two) times daily.   Yes Historical Provider, MD  atorvastatin (LIPITOR) 10 MG tablet Take 10 mg by mouth daily.   Yes Historical Provider, MD  fenofibrate micronized (LOFIBRA) 134 MG capsule Take 134 mg by mouth daily before breakfast.   Yes Historical Provider, MD  furosemide (LASIX) 40 MG tablet Take 40 mg by mouth daily.    Yes Historical  Provider, MD  hydrochlorothiazide (MICROZIDE) 12.5 MG capsule TAKE 1 CAPSULE BY MOUTH DAILY 06/23/13  Yes Troy Sine, MD  metoprolol (LOPRESSOR) 50 MG tablet Take 1 tablet (50 mg total) by mouth 2 (two) times daily. Patient taking differently: Take 25 mg by mouth 2 (two) times daily.  10/13/13  Yes Troy Sine, MD  ranitidine (ZANTAC) 150 MG tablet Take 150 mg by mouth at bedtime.    Yes Historical Provider, MD  Tamsulosin HCl (FLOMAX) 0.4 MG CAPS Take 1 capsule (0.4 mg total) by mouth daily. 07/20/11  Yes Chauncy Passy, MD  clopidogrel (PLAVIX) 75 MG tablet TAKE 1 TABLET BY MOUTH DAILY Patient not taking: Reported on 12/01/2013 05/01/13   Troy Sine, MD  oxyCODONE-acetaminophen (PERCOCET/ROXICET) 5-325 MG per  tablet Take 1 tablet by mouth every 6 (six) hours as needed for moderate pain or severe pain. 12/01/13   Merryl Hacker, MD   Physical Exam: Filed Vitals:   12/02/13 0030  BP: 117/64  Pulse: 77  Temp:   Resp: 23    BP 117/64 mmHg  Pulse 77  Temp(Src) 98 F (36.7 C) (Oral)  Resp 23  SpO2 93%  General Appearance:    Alert, oriented, no distress, appears stated age  Head:    Normocephalic, atraumatic  Eyes:    PERRL, EOMI, sclera non-icteric        Nose:   Nares without drainage or epistaxis. Mucosa, turbinates normal  Throat:   Moist mucous membranes. Oropharynx without erythema or exudate.  Neck:   Supple. No carotid bruits.  No thyromegaly.  No lymphadenopathy.   Back:     No CVA tenderness, no spinal tenderness  Lungs:     Clear to auscultation bilaterally, without wheezes, rhonchi or rales  Chest wall:    No tenderness to palpitation  Heart:    Regular rate and rhythm without murmurs, gallops, rubs  Abdomen:     Soft, non-tender, nondistended, normal bowel sounds, no organomegaly  Genitalia:    deferred  Rectal:    deferred  Extremities:   No clubbing, cyanosis or edema.  Pulses:   2+ and symmetric all extremities  Skin:   Skin color, texture, turgor normal, no rashes or lesions  Lymph nodes:   Cervical, supraclavicular, and axillary nodes normal  Neurologic:   CNII-XII intact. Normal strength, sensation and reflexes      throughout    Labs on Admission:  Basic Metabolic Panel:  Recent Labs Lab 12/01/13 1408  NA 135*  K 3.7  CL 97  GLUCOSE 135*  BUN 42*  CREATININE 2.60*   Liver Function Tests: No results for input(s): AST, ALT, ALKPHOS, BILITOT, PROT, ALBUMIN in the last 168 hours. No results for input(s): LIPASE, AMYLASE in the last 168 hours. No results for input(s): AMMONIA in the last 168 hours. CBC:  Recent Labs Lab 12/01/13 1340 12/01/13 1408  WBC 11.3*  --   NEUTROABS 9.3*  --   HGB 13.8 15.3  HCT 42.0 45.0  MCV 92.7  --   PLT 156  --     Cardiac Enzymes: No results for input(s): CKTOTAL, CKMB, CKMBINDEX, TROPONINI in the last 168 hours.  BNP (last 3 results) No results for input(s): PROBNP in the last 8760 hours. CBG:  Recent Labs Lab 12/02/13 0023  GLUCAP 152*    Radiological Exams on Admission: Mr Cervical Spine Wo Contrast  12/01/2013   CLINICAL DATA:  Mid back pain radiating to shoulders for 2 days, no numbness  or tingling in extremities.  EXAM: MRI CERVICAL AND THORACIC SPINE WITHOUT CONTRAST  TECHNIQUE: Multiplanar and multiecho pulse sequences of the cervical spine, to include the craniocervical junction and cervicothoracic junction, and thoracic spine, were obtained without intravenous contrast.  COMPARISON:  None.  FINDINGS: MRI CERVICAL SPINE FINDINGS  Cervical vertebral bodies and posterior elements are intact and aligned with maintenance of the cervical lordosis. 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 Intervertebral disc morphology generally preserved with decreased T2 signal within all cervical disc consistent with moderate desiccation. Moderate chronic discogenic endplate changes [id number] through C6-7. No STIR signal abnormality to suggest acute osseous process.  Cervical spinal cord appears normal morphology and signal characteristics from the cervicomedullary junction to the level of T1-2, with the most caudal well visualized level. Craniocervical junction is intact. Prevertebral paraspinal soft tissues are nonsuspicious. Post signal probable pannus about the odontoid process can be seen with CPPD.  Level by level evaluation:  C2-3: No disc bulge. Uncovertebral hypertrophy. Moderate to severe LEFT and moderate RIGHT facet arthropathy without superimposed canal stenosis. Mild to moderate LEFT neural foraminal narrowing.  C3-4: No disc bulge. Uncovertebral hypertrophy and mild to moderate facet arthropathy without superimposed canal stenosis. Mild  RIGHT neural foraminal narrowing.  C4-5: 22 mm annular bulging, uncovertebral hypertrophy and mild facet arthropathy. Mild canal stenosis. Mild RIGHT greater LEFT neural foraminal narrowing.  C5-6: 2 mm broad-based disc bulge asymmetric to the LEFT, uncovertebral hypertrophy and mild facet arthropathy. Moderate to severe canal stenosis with ventral and dorsal cord deformity. Moderate to severe RIGHT, severe LEFT neural foraminal narrowing.  C6-7: Annular bulging, uncovertebral hypertrophy and mild facet arthropathy. Moderate canal stenosis. Moderate to severe RIGHT, moderate LEFT neural foraminal narrowing.  C7-T1: Annular bulging, uncovertebral hypertrophy and mild facet arthropathy without canal stenosis. Moderate RIGHT, mild LEFT neural foraminal narrowing.  MRI THORACIC SPINE FINDINGS  Thoracic vertebral bodies and posterior elements are intact aligned and maintenance of thoracic kyphosis. Intervertebral disc morphology is preserved. RIGHT T1, T2 signal within T4-5, T5-6 and T7-8 disc consist with mineralization, and mildly desiccated remaining thoracic disc. Mild chronic discogenic endplate changes T 579FGE and to lesser extent the remaining lower thoracic vertebral bodies ventrally, with no STIR signal abnormality to suggest acute osseous process. Scattered chronic Schmorl's nodes.  Thoracic spinal cord appears normal morphology and signal characteristics, conus medullaris below the level L1, not imaged. Mild epidural lipomatosis. Median sternotomy noted. Possible cardiomegaly, incompletely characterized. Mild paraspinal muscle atrophy.  Mild facet arthropathy and focal epidural lipomatosis at T1-2 results in mild effacement of thecal sac without osseous canal stenosis. Moderate lower lumbar facet arthropathy.  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  Moderate to severe canal  stenosis at C5-6, moderate at C6-7.  Neural foraminal narrowing at all cervical levels: Severe on the LEFT at C5-6.  MRI THORACIC SPINE  Degenerative change of the thoracic spine without acute fracture or malalignment.  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   Electronically Signed   By: Elon Alas   On: 12/01/2013 21:09   Mr Thoracic Spine Wo Contrast  12/01/2013   CLINICAL DATA:  Mid back pain radiating to shoulders for 2 days, no numbness or tingling in extremities.  EXAM: MRI CERVICAL AND THORACIC SPINE WITHOUT CONTRAST  TECHNIQUE: Multiplanar and multiecho pulse sequences of the cervical spine, to include the craniocervical junction and cervicothoracic junction, and thoracic spine, were obtained without intravenous contrast.  COMPARISON:  None.  FINDINGS: MRI CERVICAL SPINE FINDINGS  Cervical vertebral bodies and posterior elements are intact and aligned with maintenance of the cervical lordosis. 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 Intervertebral disc morphology generally preserved with decreased T2 signal within all cervical disc consistent with moderate desiccation. Moderate chronic discogenic endplate changes [id number] through C6-7. No STIR signal abnormality to suggest acute osseous process.  Cervical spinal cord appears normal morphology and signal characteristics from the cervicomedullary junction to the level of T1-2, with the most caudal well visualized level. Craniocervical junction is intact. Prevertebral paraspinal soft tissues are nonsuspicious. Post signal probable pannus about the odontoid process can be seen with CPPD.  Level by level evaluation:  C2-3: No disc bulge. Uncovertebral hypertrophy. Moderate to severe LEFT and moderate RIGHT facet arthropathy without superimposed canal stenosis. Mild to moderate LEFT  neural foraminal narrowing.  C3-4: No disc bulge. Uncovertebral hypertrophy and mild to moderate facet arthropathy without superimposed canal stenosis. Mild RIGHT neural foraminal narrowing.  C4-5: 22 mm annular bulging, uncovertebral hypertrophy and mild facet arthropathy. Mild canal stenosis. Mild RIGHT greater LEFT neural foraminal narrowing.  C5-6: 2 mm broad-based disc bulge asymmetric to the LEFT, uncovertebral hypertrophy and mild facet arthropathy. Moderate to severe canal stenosis with ventral and dorsal cord deformity. Moderate to severe RIGHT, severe LEFT neural foraminal narrowing.  C6-7: Annular bulging, uncovertebral hypertrophy and mild facet arthropathy. Moderate canal stenosis. Moderate to severe RIGHT, moderate LEFT neural foraminal narrowing.  C7-T1: Annular bulging, uncovertebral hypertrophy and mild facet arthropathy without canal stenosis. Moderate RIGHT, mild LEFT neural foraminal narrowing.  MRI THORACIC SPINE FINDINGS  Thoracic vertebral bodies and posterior elements are intact aligned and maintenance of thoracic kyphosis. Intervertebral disc morphology is preserved. RIGHT T1, T2 signal within T4-5, T5-6 and T7-8 disc consist with mineralization, and mildly desiccated remaining thoracic disc. Mild chronic discogenic endplate changes T 579FGE and to lesser extent the remaining lower thoracic vertebral bodies ventrally, with no STIR signal abnormality to suggest acute osseous process. Scattered chronic Schmorl's nodes.  Thoracic spinal cord appears normal morphology and signal characteristics, conus medullaris below the level L1, not imaged. Mild epidural lipomatosis. Median sternotomy noted. Possible cardiomegaly, incompletely characterized. Mild paraspinal muscle atrophy.  Mild facet arthropathy and focal epidural lipomatosis at T1-2 results in mild effacement of thecal sac without osseous canal stenosis. Moderate lower lumbar facet arthropathy.  IMPRESSION: MRI CERVICAL SPINE  Degenerative  change of the cervical spine superimposed on a background of congenital canal narrowing without acute fracture or malalignment.  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  Neural foraminal narrowing at all cervical levels: Severe on the LEFT at C5-6.  MRI THORACIC SPINE  Degenerative change of the thoracic spine without acute fracture or malalignment.  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   Electronically Signed   By: Elon Alas   On: 12/01/2013 21:09   Nm Pulmonary Perf And Vent  12/01/2013   CLINICAL DATA:  LEFT-sided chest pain. Short of breath. Chest pain with breathing. Chest pain on breathing R07.1 (ICD-10-CM)  EXAM: NUCLEAR MEDICINE VENTILATION - PERFUSION LUNG SCAN  TECHNIQUE: Ventilation images were obtained in multiple projections using inhaled aerosol technetium 99  M DTPA. Perfusion images were obtained in multiple projections after intravenous injection of Tc-70m MAA.  RADIOPHARMACEUTICALS:  40.0 mCi Tc-61m DTPA aerosol and 6.0 mCi Tc-38m MAA  COMPARISON:  Chest radiograph today.  FINDINGS: Ventilation: Heterogeneous ventilation with clumping of radiotracer error in the second order bronchi.  Perfusion: No wedge shaped peripheral perfusion defects to suggest acute pulmonary embolism. Perfusion appears normal.  IMPRESSION: Low probability of pulmonary embolus.   Electronically Signed   By: Dereck Ligas M.D.   On: 12/01/2013 17:19   Dg Chest Port 1 View  12/01/2013   CLINICAL DATA:  Neck and back pain for 3 days  EXAM: PORTABLE CHEST - 1 VIEW  COMPARISON:  04/11/2010  FINDINGS: Cardiac shadow is enlarged. Postsurgical changes are again seen. No focal infiltrate or sizable effusion is seen. No acute bony abnormality is noted.  IMPRESSION: No acute abnormality noted.   Electronically Signed   By: Inez Catalina M.D.   On: 12/01/2013 14:56    EKG: Independently  reviewed.  Assessment/Plan Principal Problem:   Syncope Active Problems:   CAD - CABG '92, LAD DES 4/12, low risk Myoview 6/13   OSA on CPAP   Renal insufficiency   Left leg DVT   Fever   1. Syncope - likely dehydration due to HCTZ and lasix and no PO intake for over 12 hours. 1. Hydrating with NS 2. Holding HCTZ and lasix 3. Repeat CBC / BMP in AM 4. Serial trops (first negative) 5. Tele monitor 6. Highly doubt aortic dissection 7. 2d echo ordered 2. Fever - 1. Tylenol for fever 2. BCx, urine CX are pending, source unclear, MRI of C and T spine was negative for obvious source. 3. As discussed with family, would like to see further symptoms before initiating ABx therapy, at this point the only thing we have found to cause his fever is the known DVT. 4. Repeat CBC in AM to monitor WBC. 3. LLE DVT - 1. Continue elliquis for now 2. Need to talk with PCP about possibly switching this med however given his renal insufficiency 4. Renal insufficiency - chronic, stable, and patient has appointment with nephrologist early next month, repeat BMP in AM. 5. OSA - continue CPAP    Code Status: Full Code  Family Communication: Family at bedside Disposition Plan: Admit to inpatient   Time spent: 70 min  GARDNER, JARED M. [facility] Pager [phone]  If 7AM-7PM, please contact the day team taking care of the patient [email or web address] Password [email or web address] 12/02/2013, 1:04 AM

## 2013-12-02 NOTE — ED Notes (Signed)
Upon wheeling patient down the hall I was asking him if the gingerale was helping. Pt would not answer my question. Upon assessing the patient his eyes were open but he was staring off, unable to answer. Pt then began shaking his arm and having pursed lip breathing, then slumped over in his chair. Myself and the patients wife called for help. Emergency response team of RN's including C.A. RN, E.S. RN, M.C. RN, K.M. EMT, Santiago Glad. MD arrived at bedside. Two IV's placed immediately, CBG EKG and labs obtained promptly.

## 2013-12-03 ENCOUNTER — Observation Stay (HOSPITAL_COMMUNITY): Payer: Medicare Other

## 2013-12-03 LAB — URINE MICROSCOPIC-ADD ON

## 2013-12-03 LAB — BASIC METABOLIC PANEL
Anion gap: 15 (ref 5–15)
BUN: 44 mg/dL — ABNORMAL HIGH (ref 6–23)
CO2: 24 mEq/L (ref 19–32)
Calcium: 9.3 mg/dL (ref 8.4–10.5)
Chloride: 97 mEq/L (ref 96–112)
Creatinine, Ser: 2.13 mg/dL — ABNORMAL HIGH (ref 0.50–1.35)
GFR calc Af Amer: 33 mL/min — ABNORMAL LOW (ref >90)
GFR calc non Af Amer: 29 mL/min — ABNORMAL LOW (ref >90)
Glucose, Bld: 184 mg/dL — ABNORMAL HIGH (ref 70–99)
Potassium: 3.8 mEq/L (ref 3.7–5.3)
Sodium: 136 mEq/L — ABNORMAL LOW (ref 137–147)

## 2013-12-03 LAB — URINALYSIS, ROUTINE W REFLEX MICROSCOPIC
Bilirubin Urine: NEGATIVE
Glucose, UA: NEGATIVE mg/dL
Ketones, ur: NEGATIVE mg/dL
Nitrite: NEGATIVE
Protein, ur: 30 mg/dL — AB
Specific Gravity, Urine: 1.02 (ref 1.005–1.030)
Urobilinogen, UA: 1 mg/dL (ref 0.0–1.0)
pH: 5 (ref 5.0–8.0)

## 2013-12-03 LAB — URINE CULTURE: Colony Count: 50000

## 2013-12-03 MED ORDER — APIXABAN 5 MG PO TABS
5.0000 mg | ORAL_TABLET | Freq: Two times a day (BID) | ORAL | Status: DC
  Administered 2013-12-03 – 2013-12-04 (×3): 5 mg via ORAL
  Filled 2013-12-03 (×5): qty 1

## 2013-12-03 MED ORDER — LORAZEPAM 2 MG/ML IJ SOLN
0.5000 mg | Freq: Once | INTRAMUSCULAR | Status: AC
  Administered 2013-12-03: 0.5 mg via INTRAVENOUS
  Filled 2013-12-03: qty 1

## 2013-12-03 NOTE — Progress Notes (Signed)
RT entered room to place patient on CPAP and patient was already on CPAP. He is on auto mode 5-20cmH2O. Patient is tolerating well at this time.

## 2013-12-03 NOTE — Progress Notes (Signed)
Physical Therapy Treatment Patient Details Name: Paul Price MRN: [id number] DOB: 04-May-1940 Today's Date: 12/03/2013    History of Present Illness Patient is a 73 y/o male admitted with fever, pain and syncope.  PMH includes HTN, CAD, CABG, LBBB, and recent LLE DVT treated with Eliquis.    PT Comments    Patient limited by pain radiating down right leg and into plantar surface of right foot while ambulating. Willing to work with therapy as able but reports ambulating is too painful at the moment after a short bout of 15 feet requiring min assist for balance loss to posterior. Tolerates therapeutic exercises well. Patient will continue to benefit from skilled physical therapy services to further improve independence with functional mobility.   Follow Up Recommendations  Home health PT;Supervision/Assistance - 24 hour     Equipment Recommendations  Rolling walker with 5" wheels    Recommendations for Other Services       Precautions / Restrictions Precautions Precautions: Fall Restrictions Weight Bearing Restrictions: No    Mobility  Bed Mobility                  Transfers Overall transfer level: Needs assistance Equipment used: Rolling walker (2 wheeled) Transfers: Sit to/from Stand Sit to Stand: Min assist         General transfer comment: Min assist for boost to stand from reclining chair. VC for hand placement. Slowly scoots to edge with VC for technique.  Ambulation/Gait Ambulation/Gait assistance: Min assist Ambulation Distance (Feet): 15 Feet Assistive device: Rolling walker (2 wheeled) Gait Pattern/deviations: Step-through pattern;Decreased stride length;Antalgic;Leaning posteriorly;Trunk flexed;Narrow base of support   Gait velocity interpretation: Below normal speed for age/gender General Gait Details: Very slow and guarded. VC for upright posture. Min assist frequently provided for loss of balance to posterior. Pt needed to urinate urgently while  ambulating and had to use urnial. Became very fatigued while standing/urinating and needed to return to chair.   Stairs            Wheelchair Mobility    Modified Rankin (Stroke Patients Only)       Balance                                    Cognition Arousal/Alertness: Awake/alert Behavior During Therapy: WFL for tasks assessed/performed Overall Cognitive Status: Within Functional Limits for tasks assessed                      Exercises General Exercises - Lower Extremity Ankle Circles/Pumps: Both;10 reps;Seated;AROM Quad Sets: Strengthening;Both;10 reps;Seated Gluteal Sets: Strengthening;Both;10 reps;Seated    General Comments General comments (skin integrity, edema, etc.): Reports pain radiates down right leg laterally, into plantar surface of foot. Provided exercise handout and reviewed with patient.      Pertinent Vitals/Pain Pain Assessment: 0-10 Pain Score:  ("5 at rest - 10 while walking") Pain Location: "between shoulders, back of neck, and down my right leg" Pain Descriptors / Indicators: Radiating Pain Intervention(s): Monitored during session;Repositioned;Limited activity within patient's tolerance  SpO2 96% on room air HR 89    Home Living                      Prior Function            PT Goals (current goals can now be found in the care plan section) Progress towards PT goals: Progressing toward  goals    Frequency  Min 3X/week    PT Plan Current plan remains appropriate    Co-evaluation             End of Session Equipment Utilized During Treatment: Gait belt Activity Tolerance: Patient limited by pain;Patient limited by fatigue Patient left: in chair;with call bell/phone within reach;with family/visitor present     Time: [id number] PT Time Calculation (min) (ACUTE ONLY): 23 min  Charges:  $Gait Training: 8-22 mins $Therapeutic Activity: 8-22 mins                    G Codes:      Ellouise Newer Dec 14, 2013, 1:15 PM Camille Bal [place], [place]

## 2013-12-03 NOTE — Progress Notes (Addendum)
[facility] PROGRESS NOTE Assessment/Plan: Syncope: - Unclear etiology. Most likely pre-renal and/or acute PE. b-met pending. - Held ACE-I and lasix on admission. - Cardiac markers negative. - V/q scan low probability. No events on telemetry. - Echo as below.  Complaining of back pain: - check MRI of lumbar spine. - C-spine and cervical negative. - ? Due to PE.  Fever of unclear etiology: - Due to DVT. - Use tylenol. - BCx, urine CX are negative. - MRI of C and T spine was negative. - no further fevers.  Left leg DVT: - cont eliquis. - GFR. 30.  Chronic Renal insufficiency: - chronic, stable, and patient has appointment with nephrologist early next month  OSA on CPAP - cont cpap.   Code Status: full Family Communication: none  Disposition Plan: home in 24hrs   Consultants:  none  Procedures:  V/q scan low probability  Echo 11.24.2015: Features are consistent with a pseudonormal left ventricular filling pattern, with concomitant abnormal relaxation and increased filling pressure (grade 2 diastolic dysfunction). - Pulmonary arteries: Systolic pressure was mildly increased. PA peak pressure: 34 mm Hg.  Antibiotics:  None  HPI/Subjective: Relates back and now hip pain.  Objective: Filed Vitals:   12/02/13 1215 12/02/13 2018 12/02/13 2200 12/03/13 0522  BP:  124/52  132/57  Pulse:  86 72 86  Temp:  99.3 F (37.4 C)  98.9 F (37.2 C)  TempSrc:  Oral  Oral  Resp:  18 18 18   Height:      Weight:      SpO2: 98% 95% 95% 100%    Intake/Output Summary (Last 24 hours) at 12/03/13 0832 Last data filed at 12/03/13 0523  Gross per 24 hour  Intake    240 ml  Output    900 ml  Net   -660 ml   Filed Weights   12/02/13 0138  Weight: 93.2 kg (205 lb 7.5 oz)    Exam:  General: Alert, awake, oriented x3, in no acute distress.  HEENT: No bruits, no goiter.  Heart: Regular rate and rhythm. Lungs: Good air movement, clear Abdomen:  Soft, nontender, nondistended, positive bowel sounds.     Data Reviewed: Basic Metabolic Panel:  Recent Labs Lab 12/01/13 1408 12/02/13 0113  NA 135* 137  K 3.7 3.5*  CL 97 97  CO2  --  22  GLUCOSE 135* 138*  BUN 42* 42*  CREATININE 2.60* 2.06*  CALCIUM  --  8.6   Liver Function Tests: No results for input(s): AST, ALT, ALKPHOS, BILITOT, PROT, ALBUMIN in the last 168 hours. No results for input(s): LIPASE, AMYLASE in the last 168 hours. No results for input(s): AMMONIA in the last 168 hours. CBC:  Recent Labs Lab 12/01/13 1340 12/01/13 1408 12/02/13 0113  WBC 11.3*  --  11.0*  NEUTROABS 9.3*  --   --   HGB 13.8 15.3 11.7*  HCT 42.0 45.0 35.4*  MCV 92.7  --  91.9  PLT 156  --  146*   Cardiac Enzymes:  Recent Labs Lab 12/02/13 0030 12/02/13 0113 12/02/13 0649 12/02/13 1350  TROPONINI <0.30 <0.30 <0.30 <0.30   BNP (last 3 results) No results for input(s): PROBNP in the last 8760 hours. CBG:  Recent Labs Lab 12/02/13 0023  GLUCAP 152*    Recent Results (from the past 240 hour(s))  Urine culture     Status: None   Collection Time: 12/01/13  8:39 PM  Result Value Ref Range Status   Specimen Description URINE,  CLEAN CATCH  Final   Special Requests NONE  Final   Culture  Setup Time   Final    12/01/2013 23:11 Performed at [facility]   Final    50,000 COLONIES/ML Performed at [facility]    Culture   Final    Multiple bacterial morphotypes present, none predominant. Suggest appropriate recollection if clinically indicated. Performed at [facility]    Report Status 12/03/2013 FINAL  Final  Blood culture (routine x 2)     Status: None (Preliminary result)   Collection Time: 12/02/13 12:30 AM  Result Value Ref Range Status   Specimen Description BLOOD LEFT ANTECUBITAL  Final   Special Requests BOTTLES DRAWN AEROBIC AND ANAEROBIC 5CC EA  Final   Culture  Setup Time   Final    12/02/2013 04:45 Performed  at [facility]    Culture   Final           BLOOD CULTURE RECEIVED NO GROWTH TO DATE CULTURE WILL BE HELD FOR 5 DAYS BEFORE ISSUING A FINAL NEGATIVE REPORT Performed at [facility]    Report Status PENDING  Incomplete  Blood culture (routine x 2)     Status: None (Preliminary result)   Collection Time: 12/02/13  1:13 AM  Result Value Ref Range Status   Specimen Description BLOOD LEFT HAND  Final   Special Requests BOTTLES DRAWN AEROBIC AND ANAEROBIC 5CC EA  Final   Culture  Setup Time   Final    12/02/2013 10:36 Performed at [facility]    Culture   Final           BLOOD CULTURE RECEIVED NO GROWTH TO DATE CULTURE WILL BE HELD FOR 5 DAYS BEFORE ISSUING A FINAL NEGATIVE REPORT Performed at [facility]    Report Status PENDING  Incomplete     Studies: Mr Cervical Spine Wo Contrast  12/01/2013   CLINICAL DATA:  Mid back pain radiating to shoulders for 2 days, no numbness or tingling in extremities.  EXAM: MRI CERVICAL AND THORACIC SPINE WITHOUT CONTRAST  TECHNIQUE: Multiplanar and multiecho pulse sequences of the cervical spine, to include the craniocervical junction and cervicothoracic junction, and thoracic spine, were obtained without intravenous contrast.  COMPARISON:  None.  FINDINGS: MRI CERVICAL SPINE FINDINGS  Cervical vertebral bodies and posterior elements are intact and aligned with maintenance of the cervical lordosis. 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 Intervertebral disc morphology generally preserved with decreased T2 signal within all cervical disc consistent with moderate desiccation. Moderate chronic discogenic endplate changes W9-6 through C6-7. No STIR signal abnormality to suggest acute osseous process.  Cervical spinal cord appears normal morphology and signal characteristics from the cervicomedullary junction to the level of T1-2, with the most caudal well  visualized level. Craniocervical junction is intact. Prevertebral paraspinal soft tissues are nonsuspicious. Post signal probable pannus about the odontoid process can be seen with CPPD.  Level by level evaluation:  C2-3: No disc bulge. Uncovertebral hypertrophy. Moderate to severe LEFT and moderate RIGHT facet arthropathy without superimposed canal stenosis. Mild to moderate LEFT neural foraminal narrowing.  C3-4: No disc bulge. Uncovertebral hypertrophy and mild to moderate facet arthropathy without superimposed canal stenosis. Mild RIGHT neural foraminal narrowing.  C4-5: 22 mm annular bulging, uncovertebral hypertrophy and mild facet arthropathy. Mild canal stenosis. Mild RIGHT greater LEFT neural foraminal narrowing.  C5-6: 2 mm broad-based disc  bulge asymmetric to the LEFT, uncovertebral hypertrophy and mild facet arthropathy. Moderate to severe canal stenosis with ventral and dorsal cord deformity. Moderate to severe RIGHT, severe LEFT neural foraminal narrowing.  C6-7: Annular bulging, uncovertebral hypertrophy and mild facet arthropathy. Moderate canal stenosis. Moderate to severe RIGHT, moderate LEFT neural foraminal narrowing.  C7-T1: Annular bulging, uncovertebral hypertrophy and mild facet arthropathy without canal stenosis. Moderate RIGHT, mild LEFT neural foraminal narrowing.  MRI THORACIC SPINE FINDINGS  Thoracic vertebral bodies and posterior elements are intact aligned and maintenance of thoracic kyphosis. Intervertebral disc morphology is preserved. RIGHT T1, T2 signal within T4-5, T5-6 and T7-8 disc consist with mineralization, and mildly desiccated remaining thoracic disc. Mild chronic discogenic endplate changes T 21/3086 and to lesser extent the remaining lower thoracic vertebral bodies ventrally, with no STIR signal abnormality to suggest acute osseous process. Scattered chronic Schmorl's nodes.  Thoracic spinal cord appears normal morphology and signal characteristics, conus medullaris below  the level L1, not imaged. Mild epidural lipomatosis. Median sternotomy noted. Possible cardiomegaly, incompletely characterized. Mild paraspinal muscle atrophy.  Mild facet arthropathy and focal epidural lipomatosis at T1-2 results in mild effacement of thecal sac without osseous canal stenosis. Moderate lower lumbar facet arthropathy.  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  Neural foraminal narrowing at all cervical levels: Severe on the LEFT at C5-6.  MRI THORACIC SPINE  Degenerative change of the thoracic spine without acute fracture or malalignment.  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   Electronically Signed   By: Elon Alas   On: 12/01/2013 21:09   Mr Thoracic Spine Wo Contrast  12/01/2013   CLINICAL DATA:  Mid back pain radiating to shoulders for 2 days, no numbness or tingling in extremities.  EXAM: MRI CERVICAL AND THORACIC SPINE WITHOUT CONTRAST  TECHNIQUE: Multiplanar and multiecho pulse sequences of the cervical spine, to include the craniocervical junction and cervicothoracic junction, and thoracic spine, were obtained without intravenous contrast.  COMPARISON:  None.  FINDINGS: MRI CERVICAL SPINE FINDINGS  Cervical vertebral bodies and posterior elements are intact and aligned with maintenance of the cervical lordosis. 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 Intervertebral disc morphology generally preserved with decreased T2 signal within all cervical disc consistent with moderate desiccation. Moderate chronic discogenic endplate changes V7-8 through C6-7. No STIR signal abnormality to suggest acute osseous process.   Cervical spinal cord appears normal morphology and signal characteristics from the cervicomedullary junction to the level of T1-2, with the most caudal well visualized level. Craniocervical junction is intact. Prevertebral paraspinal soft tissues are nonsuspicious. Post signal probable pannus about the odontoid process can be seen with CPPD.  Level by level evaluation:  C2-3: No disc bulge. Uncovertebral hypertrophy. Moderate to severe LEFT and moderate RIGHT facet arthropathy without superimposed canal stenosis. Mild to moderate LEFT neural foraminal narrowing.  C3-4: No disc bulge. Uncovertebral hypertrophy and mild to moderate facet arthropathy without superimposed canal stenosis. Mild RIGHT neural foraminal narrowing.  C4-5: 22 mm annular bulging, uncovertebral hypertrophy and mild facet arthropathy. Mild canal stenosis. Mild RIGHT greater LEFT neural foraminal narrowing.  C5-6: 2 mm broad-based disc bulge asymmetric to the LEFT, uncovertebral hypertrophy and mild facet arthropathy. Moderate to severe  canal stenosis with ventral and dorsal cord deformity. Moderate to severe RIGHT, severe LEFT neural foraminal narrowing.  C6-7: Annular bulging, uncovertebral hypertrophy and mild facet arthropathy. Moderate canal stenosis. Moderate to severe RIGHT, moderate LEFT neural foraminal narrowing.  C7-T1: Annular bulging, uncovertebral hypertrophy and mild facet arthropathy without canal stenosis. Moderate RIGHT, mild LEFT neural foraminal narrowing.  MRI THORACIC SPINE FINDINGS  Thoracic vertebral bodies and posterior elements are intact aligned and maintenance of thoracic kyphosis. Intervertebral disc morphology is preserved. RIGHT T1, T2 signal within T4-5, T5-6 and T7-8 disc consist with mineralization, and mildly desiccated remaining thoracic disc. Mild chronic discogenic endplate changes T 27/7412 and to lesser extent the remaining lower thoracic vertebral bodies ventrally, with no STIR signal abnormality to suggest  acute osseous process. Scattered chronic Schmorl's nodes.  Thoracic spinal cord appears normal morphology and signal characteristics, conus medullaris below the level L1, not imaged. Mild epidural lipomatosis. Median sternotomy noted. Possible cardiomegaly, incompletely characterized. Mild paraspinal muscle atrophy.  Mild facet arthropathy and focal epidural lipomatosis at T1-2 results in mild effacement of thecal sac without osseous canal stenosis. Moderate lower lumbar facet arthropathy.  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  Neural foraminal narrowing at all cervical levels: Severe on the LEFT at C5-6.  MRI THORACIC SPINE  Degenerative change of the thoracic spine without acute fracture or malalignment.  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   Electronically Signed   By: Elon Alas   On: 12/01/2013 21:09   Nm Pulmonary Perf And Vent  12/01/2013   CLINICAL DATA:  LEFT-sided chest pain. Short of breath. Chest pain with breathing. Chest pain on breathing R07.1 (ICD-10-CM)  EXAM: NUCLEAR MEDICINE VENTILATION - PERFUSION LUNG SCAN  TECHNIQUE: Ventilation images were obtained in multiple projections using inhaled aerosol technetium 99 M DTPA. Perfusion images were obtained in multiple projections after intravenous injection of Tc-35mMAA.  RADIOPHARMACEUTICALS:  40.0 mCi Tc-95mTPA aerosol and 6.0 mCi Tc-9932mA  COMPARISON:  Chest radiograph today.  FINDINGS: Ventilation: Heterogeneous ventilation with clumping of radiotracer error in the second order bronchi.  Perfusion: No wedge shaped peripheral perfusion defects to suggest acute pulmonary embolism. Perfusion appears normal.  IMPRESSION: Low probability of  pulmonary embolus.   Electronically Signed   By: GeoDereck LigasD.   On: 12/01/2013 17:19   Dg Chest Port 1 View  12/01/2013   CLINICAL DATA:  Neck and back pain for 3 days  EXAM: PORTABLE CHEST - 1 VIEW  COMPARISON:  04/11/2010  FINDINGS: Cardiac shadow is enlarged. Postsurgical changes are again seen. No focal infiltrate or sizable effusion is seen. No acute bony abnormality is noted.  IMPRESSION: No acute abnormality noted.   Electronically Signed   By: MarInez CatalinaD.   On: 12/01/2013 14:56    Scheduled Meds: . apixaban  10 mg Oral BID  . atorvastatin  10 mg Oral Daily  . famotidine  20 mg Oral QHS  . metoprolol  25 mg Oral BID  . sodium chloride  3 mL Intravenous Q12H  . tamsulosin  0.4 mg Oral Daily   Continuous Infusions:     [facility] Pager [phone] 8PM-8AM, please contact night-coverage at [email or web address], password [email or web address]/25/2015, 8:32 AM  LOS: 2 days

## 2013-12-03 NOTE — Plan of Care (Signed)
Problem: Consults Goal: General Medical Patient Education See Patient Education Module for specific education. Outcome: Completed/Met Date Met:  12/03/13 Goal: Skin Care Protocol Initiated - if Braden Score 18 or less If consults are not indicated, leave blank or document N/A Outcome: Completed/Met Date Met:  12/03/13 Goal: Nutrition Consult-if indicated Outcome: Not Applicable Date Met:  78/93/81 Goal: Diabetes Guidelines if Diabetic/Glucose > 140 If diabetic or lab glucose is > 140 mg/dl - Initiate Diabetes/Hyperglycemia Guidelines & Document Interventions  Outcome: Not Applicable Date Met:  01/75/10  Problem: Phase I Progression Outcomes Goal: Pain controlled with appropriate interventions Outcome: Progressing Goal: OOB as tolerated unless otherwise ordered Outcome: Progressing Goal: Voiding-avoid urinary catheter unless indicated Outcome: Completed/Met Date Met:  12/03/13 Goal: Hemodynamically stable Outcome: Completed/Met Date Met:  12/03/13 Goal: Other Phase I Outcomes/Goals Outcome: Completed/Met Date Met:  12/03/13

## 2013-12-04 LAB — CBC
HCT: 38.5 % — ABNORMAL LOW (ref 39.0–52.0)
Hemoglobin: 12.7 g/dL — ABNORMAL LOW (ref 13.0–17.0)
MCH: 30.6 pg (ref 26.0–34.0)
MCHC: 33 g/dL (ref 30.0–36.0)
MCV: 92.8 fL (ref 78.0–100.0)
Platelets: 196 10*3/uL (ref 150–400)
RBC: 4.15 MIL/uL — ABNORMAL LOW (ref 4.22–5.81)
RDW: 13.2 % (ref 11.5–15.5)
WBC: 10.4 10*3/uL (ref 4.0–10.5)

## 2013-12-04 LAB — BASIC METABOLIC PANEL
Anion gap: 14 (ref 5–15)
BUN: 46 mg/dL — ABNORMAL HIGH (ref 6–23)
CO2: 25 mEq/L (ref 19–32)
Calcium: 9.4 mg/dL (ref 8.4–10.5)
Chloride: 99 mEq/L (ref 96–112)
Creatinine, Ser: 1.97 mg/dL — ABNORMAL HIGH (ref 0.50–1.35)
GFR calc Af Amer: 37 mL/min — ABNORMAL LOW (ref >90)
GFR calc non Af Amer: 31 mL/min — ABNORMAL LOW (ref >90)
Glucose, Bld: 126 mg/dL — ABNORMAL HIGH (ref 70–99)
Potassium: 4 mEq/L (ref 3.7–5.3)
Sodium: 138 mEq/L (ref 137–147)

## 2013-12-04 LAB — SEDIMENTATION RATE: Sed Rate: 115 mm/hr — ABNORMAL HIGH (ref 0–16)

## 2013-12-04 LAB — URIC ACID: Uric Acid, Serum: 9.8 mg/dL — ABNORMAL HIGH (ref 4.0–7.8)

## 2013-12-04 NOTE — Progress Notes (Signed)
[facility] PROGRESS NOTE Assessment/Plan: Syncope: - Unclear etiology. Most likely pre-renal and/or acute PE. b-met pending. - Held ACE-I and lasix on admission. - Cardiac markers negative. - V/q scan low probability. No events on telemetry. - Echo as below.  Complaining of back pain: - MRI of lumbar spine as below. - C-spine and cervical negative.  Fever of unclear etiology: - Use tylenol. Cont to spike fevers. - Repeat cultures. - BCx, urine CX are negative.- MRI of lumbar spine as below check uric acid level. - CBC is pending check ESR cont to be off antibiotics.  Left leg DVT: - cont eliquis. - GFR. 30.  Chronic Renal insufficiency: - chronic, stable, and patient has appointment with nephrologist early next month  OSA on CPAP - cont cpap.   Code Status: full Family Communication: none  Disposition Plan: inpatient   Consultants:  none  Procedures: - V/q scan low probability - Echo 11.24.2015: Features are consistent with a pseudonormal left ventricular filling pattern, with concomitant abnormal relaxation and increased filling pressure (grade 2 diastolic dysfunction). - Pulmonary arteries: Systolic pressure was mildly increased. PA peak pressure: 34 mm Hg. - MRI lumbar spine: Widespread abnormal soft tissue inflammation in the posterior paraspinal soft tissues, primarily the right, surrounding chronically degenerated lumbar facets. Small volume of more free appearing fluid tracking along the right iliacus muscle. No marrow edema to suggest a septic facet joint at this time, however, recommend correlation with blood cultures as the appearance is suspicious for paraspinal cellulitis/myositis in this setting.  Widespread degenerative lumbar spinal stenosis ranging from mild (L2-L3) to severe (L4-L5 and L5-S1).  If the patient's fever source remains occult, recommend also repeat MRI (without and with contrast preferred) of the cervicothoracic junction  where similar soft tissue inflammation and nonspecific posterior epidural signal was demonstrated on 12/01/2013.  Antibiotics:  None  HPI/Subjective: Cont to have back pain narcotics controlling it.  Objective: Filed Vitals:   12/03/13 2045 12/03/13 2315 12/04/13 0356 12/04/13 0456  BP:  126/66  129/58  Pulse: 83 78  88  Temp:   100.7 F (38.2 C) 98.5 F (36.9 C)  TempSrc:    Oral  Resp: 16   18  Height:      Weight:      SpO2: 95%   94%    Intake/Output Summary (Last 24 hours) at 12/04/13 0813 Last data filed at 12/04/13 0357  Gross per 24 hour  Intake    663 ml  Output    200 ml  Net    463 ml   Filed Weights   12/02/13 0138  Weight: 93.2 kg (205 lb 7.5 oz)    Exam:  General: Alert, awake, oriented x3, in no acute distress.  HEENT: No bruits, no goiter.  Heart: Regular rate and rhythm. Lungs: Good air movement, clear Abdomen: Soft, nontender, nondistended, positive bowel sounds.     Data Reviewed: Basic Metabolic Panel:  Recent Labs Lab 12/01/13 1408 12/02/13 0113 12/03/13 0850  NA 135* 137 136*  K 3.7 3.5* 3.8  CL 97 97 97  CO2  --  22 24  GLUCOSE 135* 138* 184*  BUN 42* 42* 44*  CREATININE 2.60* 2.06* 2.13*  CALCIUM  --  8.6 9.3   Liver Function Tests: No results for input(s): AST, ALT, ALKPHOS, BILITOT, PROT, ALBUMIN in the last 168 hours. No results for input(s): LIPASE, AMYLASE in the last 168 hours. No results for input(s): AMMONIA in the last 168 hours. CBC:  Recent Labs  Lab 12/01/13 1340 12/01/13 1408 12/02/13 0113  WBC 11.3*  --  11.0*  NEUTROABS 9.3*  --   --   HGB 13.8 15.3 11.7*  HCT 42.0 45.0 35.4*  MCV 92.7  --  91.9  PLT 156  --  146*   Cardiac Enzymes:  Recent Labs Lab 12/02/13 0030 12/02/13 0113 12/02/13 0649 12/02/13 1350  TROPONINI <0.30 <0.30 <0.30 <0.30   BNP (last 3 results) No results for input(s): PROBNP in the last 8760 hours. CBG:  Recent Labs Lab 12/02/13 0023  GLUCAP 152*    Recent  Results (from the past 240 hour(s))  Urine culture     Status: None   Collection Time: 12/01/13  8:39 PM  Result Value Ref Range Status   Specimen Description URINE, CLEAN CATCH  Final   Special Requests NONE  Final   Culture  Setup Time   Final    12/01/2013 23:11 Performed at [place]   Final    50,000 COLONIES/ML Performed at [facility]    Culture   Final    Multiple bacterial morphotypes present, none predominant. Suggest appropriate recollection if clinically indicated. Performed at [facility]    Report Status 12/03/2013 FINAL  Final  Blood culture (routine x 2)     Status: None (Preliminary result)   Collection Time: 12/02/13 12:30 AM  Result Value Ref Range Status   Specimen Description BLOOD LEFT ANTECUBITAL  Final   Special Requests BOTTLES DRAWN AEROBIC AND ANAEROBIC 5CC EA  Final   Culture  Setup Time   Final    12/02/2013 04:45 Performed at [facility]    Culture   Final           BLOOD CULTURE RECEIVED NO GROWTH TO DATE CULTURE WILL BE HELD FOR 5 DAYS BEFORE ISSUING A FINAL NEGATIVE REPORT Performed at [facility]    Report Status PENDING  Incomplete  Blood culture (routine x 2)     Status: None (Preliminary result)   Collection Time: 12/02/13  1:13 AM  Result Value Ref Range Status   Specimen Description BLOOD LEFT HAND  Final   Special Requests BOTTLES DRAWN AEROBIC AND ANAEROBIC 5CC EA  Final   Culture  Setup Time   Final    12/02/2013 10:36 Performed at [facility]    Culture   Final           BLOOD CULTURE RECEIVED NO GROWTH TO DATE CULTURE WILL BE HELD FOR 5 DAYS BEFORE ISSUING A FINAL NEGATIVE REPORT Performed at [facility]    Report Status PENDING  Incomplete     Studies: Mr Lumbar Spine Wo Contrast  12/03/2013   CLINICAL DATA:  73 year old male with back pain and fever. Initial encounter.  EXAM: MRI LUMBAR SPINE WITHOUT CONTRAST  TECHNIQUE: Multiplanar,  multisequence MR imaging of the lumbar spine was performed. No intravenous contrast was administered.  COMPARISON:  Thoracic and cervical MRI 12/01/2013.  FINDINGS: Same numbering system used as on the recent thoracic comparison, which results in normal lumbar segmentation. Lumbar vertebral height and alignment within normal limits. No marrow edema or evidence of acute osseous abnormality.  Fairly extensive abnormal increased T2 and STIR signal in the posterior paraspinal soft tissues greater on the right. Soft tissues surrounding the chronically degenerated lumbar facet are affected, as well as some of the erector spine I muscles (primarily the right). See series 5, image 7. Posteriorly situated synovial cysts  at most levels. No fluid collections suspicious for abscess identified.  However, there is fluid tracking along the right iliacus muscle seen only on axial images (series 6, image 15).  No epidural inflammation identified.  Negative visualized abdominal viscera. Visualized lower thoracic spinal cord is normal with conus medularis at L1.  T12-L1:  Mild facet hypertrophy.  L1-L2:  Mild facet hypertrophy.  L2-L3: Circumferential disc bulge. Moderate to severe facet hypertrophy. Mild spinal stenosis (series 6, image 10).  L3-L4: Circumferential disc bulge. Severe facet hypertrophy. Mild to moderate spinal and bilateral L3 foraminal stenosis.  L4-L5: Circumferential disc bulge. Severe facet and ligament flavum hypertrophy. Severe spinal stenosis (series 6, image 23). Mild to moderate bilateral L4 foraminal stenosis.  L5-S1: Epidural lipomatosis. Circumferential disc osteophyte complex. Severe facet hypertrophy. Severe spinal stenosis (series 6, image 29). Capacious facets with joint fluid. Multiple subchondral cysts on the right (series 6, image 29). Moderate left and mild right L5 foraminal stenosis.  Visible sacrum intact.  IMPRESSION: 1. Widespread abnormal soft tissue inflammation in the posterior paraspinal  soft tissues, primarily the right, surrounding chronically degenerated lumbar facets. Small volume of more free appearing fluid tracking along the right iliacus muscle. 2. No marrow edema to suggest a septic facet joint at this time, however, recommend correlation with blood cultures as the appearance is suspicious for paraspinal cellulitis/myositis in this setting. 3. Widespread degenerative lumbar spinal stenosis ranging from mild (L2-L3) to severe (L4-L5 and L5-S1). 4. If the patient's fever source remains occult, recommend also repeat MRI (without and with contrast preferred) of the cervicothoracic junction where similar soft tissue inflammation and nonspecific posterior epidural signal was demonstrated on 12/01/2013.   Electronically Signed   By: Lars Pinks M.D.   On: 12/03/2013 19:45    Scheduled Meds: . apixaban  5 mg Oral BID  . atorvastatin  10 mg Oral Daily  . famotidine  20 mg Oral QHS  . metoprolol  25 mg Oral BID  . sodium chloride  3 mL Intravenous Q12H  . tamsulosin  0.4 mg Oral Daily   Continuous Infusions:     Charlynne Cousins  [facility] Pager [phone]. If 8PM-8AM, please contact night-coverage at [email or web address], password [facility] 12/04/2013, 8:13 AM  LOS: 3 days

## 2013-12-04 NOTE — Plan of Care (Signed)
Problem: Phase I Progression Outcomes Goal: Pain controlled with appropriate interventions Outcome: Completed/Met Date Met:  12/04/13  Problem: Phase III Progression Outcomes Goal: Voiding independently Outcome: Completed/Met Date Met:  12/04/13

## 2013-12-04 NOTE — Plan of Care (Signed)
Problem: Phase II Progression Outcomes Goal: Discharge plan established Outcome: Completed/Met Date Met:  12/04/13 Goal: Vital signs remain stable Outcome: Completed/Met Date Met:  12/04/13 Goal: IV changed to normal saline lock Outcome: Completed/Met Date Met:  12/04/13

## 2013-12-05 DIAGNOSIS — M6088 Other myositis, other site: Secondary | ICD-10-CM

## 2013-12-05 DIAGNOSIS — M6080 Other myositis, unspecified site: Secondary | ICD-10-CM | POA: Diagnosis present

## 2013-12-05 DIAGNOSIS — R509 Fever, unspecified: Secondary | ICD-10-CM

## 2013-12-05 LAB — BASIC METABOLIC PANEL
Anion gap: 16 — ABNORMAL HIGH (ref 5–15)
BUN: 46 mg/dL — ABNORMAL HIGH (ref 6–23)
CO2: 24 mEq/L (ref 19–32)
Calcium: 9.6 mg/dL (ref 8.4–10.5)
Chloride: 97 mEq/L (ref 96–112)
Creatinine, Ser: 1.78 mg/dL — ABNORMAL HIGH (ref 0.50–1.35)
GFR calc Af Amer: 41 mL/min — ABNORMAL LOW (ref >90)
GFR calc non Af Amer: 36 mL/min — ABNORMAL LOW (ref >90)
Glucose, Bld: 116 mg/dL — ABNORMAL HIGH (ref 70–99)
Potassium: 4.5 mEq/L (ref 3.7–5.3)
Sodium: 137 mEq/L (ref 137–147)

## 2013-12-05 LAB — URINE CULTURE: Colony Count: 25000

## 2013-12-05 LAB — PROTIME-INR
INR: 2.37 — ABNORMAL HIGH (ref 0.00–1.49)
Prothrombin Time: 26.1 seconds — ABNORMAL HIGH (ref 11.6–15.2)

## 2013-12-05 MED ORDER — VANCOMYCIN HCL 10 G IV SOLR
1250.0000 mg | INTRAVENOUS | Status: DC
  Administered 2013-12-06 – 2013-12-07 (×2): 1250 mg via INTRAVENOUS
  Filled 2013-12-05 (×3): qty 1250

## 2013-12-05 MED ORDER — HEPARIN (PORCINE) IN NACL 100-0.45 UNIT/ML-% IJ SOLN
1100.0000 [IU]/h | INTRAMUSCULAR | Status: DC
  Filled 2013-12-05: qty 250

## 2013-12-05 MED ORDER — SODIUM CHLORIDE 0.9 % IV SOLN: INTRAVENOUS | Status: DC

## 2013-12-05 MED ORDER — VANCOMYCIN HCL 10 G IV SOLR
1500.0000 mg | Freq: Once | INTRAVENOUS | Status: AC
  Administered 2013-12-05: 1500 mg via INTRAVENOUS
  Filled 2013-12-05: qty 1500

## 2013-12-05 MED ORDER — APIXABAN 5 MG PO TABS
5.0000 mg | ORAL_TABLET | Freq: Two times a day (BID) | ORAL | Status: DC
  Administered 2013-12-05 – 2013-12-09 (×9): 5 mg via ORAL
  Filled 2013-12-05 (×10): qty 1

## 2013-12-05 MED ORDER — CEFTRIAXONE SODIUM IN DEXTROSE 20 MG/ML IV SOLN
1.0000 g | INTRAVENOUS | Status: DC
  Administered 2013-12-05 – 2013-12-08 (×4): 1 g via INTRAVENOUS
  Filled 2013-12-05 (×5): qty 50

## 2013-12-05 MED ORDER — HYDROCODONE-ACETAMINOPHEN 5-325 MG PO TABS
2.0000 | ORAL_TABLET | ORAL | Status: DC
  Administered 2013-12-05 – 2013-12-09 (×17): 2 via ORAL
  Filled 2013-12-05 (×19): qty 2

## 2013-12-05 NOTE — Progress Notes (Signed)
Patient ID: Paul Price, male   DOB: 07/06/1940, 73 y.o.   MRN: [id number]   Request made for Lumbar paraspinal inflammatory area aspiration Reviewed with Dr Vernard Gambles  No specific fluid collection noted No area to aspirate Cannot perform IR procedure  This called to Dr Aileen Fass He is aware and agreeable

## 2013-12-05 NOTE — Progress Notes (Signed)
ANTIBIOTIC CONSULT NOTE - INITIAL  Pharmacy Consult for Vancomycin Indication: Empiric - Fever with unknown source  Allergies  Allergen Reactions  . Nitrostat [Nitroglycerin]     Causes blood pressure to "bottom out"    Patient Measurements: Height: 5\' 3"  (160 cm) Weight: 205 lb 7.5 oz (93.2 kg) IBW/kg (Calculated) : 56.9 Adjusted Body Weight:   Vital Signs: Temp: 99.4 F (37.4 C) (11/27 0426) Temp Source: Oral (11/27 0426) BP: 147/65 mmHg (11/27 0426) Pulse Rate: 79 (11/27 0426) Intake/Output from previous day: 11/26 0701 - 11/27 0700 In: 603 [P.O.:600; I.V.:3] Out: 550 [Urine:550] Intake/Output from this shift: Total I/O In: 0  Out: 180 [Urine:180]  Labs:  Recent Labs  12/03/13 0850 12/04/13 0732 12/04/13 0820 12/05/13 0610  WBC  --   --  10.4  --   HGB  --   --  12.7*  --   PLT  --   --  196  --   CREATININE 2.13* 1.97*  --  1.78*   Estimated Creatinine Clearance: 36.2 mL/min (by C-G formula based on Cr of 1.78). No results for input(s): VANCOTROUGH, VANCOPEAK, VANCORANDOM, GENTTROUGH, GENTPEAK, GENTRANDOM, TOBRATROUGH, TOBRAPEAK, TOBRARND, AMIKACINPEAK, AMIKACINTROU, AMIKACIN in the last 72 hours.   Microbiology: Recent Results (from the past 720 hour(s))  Urine culture     Status: None   Collection Time: 12/01/13  8:39 PM  Result Value Ref Range Status   Specimen Description URINE, CLEAN CATCH  Final   Special Requests NONE  Final   Culture  Setup Time   Final    12/01/2013 23:11 Performed at [place]   Final    50,000 COLONIES/ML Performed at [facility]    Culture   Final    Multiple bacterial morphotypes present, none predominant. Suggest appropriate recollection if clinically indicated. Performed at [facility]    Report Status 12/03/2013 FINAL  Final  Blood culture (routine x 2)     Status: None (Preliminary result)   Collection Time: 12/02/13 12:30 AM  Result Value Ref Range Status   Specimen Description BLOOD LEFT ANTECUBITAL  Final   Special Requests BOTTLES DRAWN AEROBIC AND ANAEROBIC 5CC EA  Final   Culture  Setup Time   Final    12/02/2013 04:45 Performed at [facility]    Culture   Final           BLOOD CULTURE RECEIVED NO GROWTH TO DATE CULTURE WILL BE HELD FOR 5 DAYS BEFORE ISSUING A FINAL NEGATIVE REPORT Performed at [facility]    Report Status PENDING  Incomplete  Blood culture (routine x 2)     Status: None (Preliminary result)   Collection Time: 12/02/13  1:13 AM  Result Value Ref Range Status   Specimen Description BLOOD LEFT HAND  Final   Special Requests BOTTLES DRAWN AEROBIC AND ANAEROBIC 5CC EA  Final   Culture  Setup Time   Final    12/02/2013 10:36 Performed at [facility]    Culture   Final           BLOOD CULTURE RECEIVED NO GROWTH TO DATE CULTURE WILL BE HELD FOR 5 DAYS BEFORE ISSUING A FINAL NEGATIVE REPORT Performed at [facility]    Report Status PENDING  Incomplete  Urine culture     Status: None   Collection Time: 12/03/13  9:21 AM  Result Value Ref Range Status   Specimen Description URINE, CLEAN CATCH  Final  Special Requests none  Final   Culture  Setup Time   Final    12/03/2013 18:23 Performed at [place]   Final    25,000 COLONIES/ML Performed at [facility]    Culture   Final    Multiple bacterial morphotypes present, none predominant. Suggest appropriate recollection if clinically indicated. Performed at [facility]    Report Status 12/05/2013 FINAL  Final  Culture, blood (routine x 2)     Status: None (Preliminary result)   Collection Time: 12/04/13  9:30 AM  Result Value Ref Range Status   Specimen Description BLOOD RIGHT HAND  Final   Special Requests BOTTLES DRAWN AEROBIC ONLY [facility]  Final   Culture  Setup Time   Final    12/04/2013 14:09 Performed at [facility]    Culture   Final           BLOOD CULTURE RECEIVED  NO GROWTH TO DATE CULTURE WILL BE HELD FOR 5 DAYS BEFORE ISSUING A FINAL NEGATIVE REPORT Performed at [facility]    Report Status PENDING  Incomplete  Culture, blood (routine x 2)     Status: None (Preliminary result)   Collection Time: 12/04/13  9:50 AM  Result Value Ref Range Status   Specimen Description BLOOD LEFT HAND  Final   Special Requests BOTTLES DRAWN AEROBIC ONLY 3CC  Final   Culture  Setup Time   Final    12/04/2013 14:05 Performed at [facility]    Culture   Final           BLOOD CULTURE RECEIVED NO GROWTH TO DATE CULTURE WILL BE HELD FOR 5 DAYS BEFORE ISSUING A FINAL NEGATIVE REPORT Performed at [facility]    Report Status PENDING  Incomplete    Medical History: Past Medical History  Diagnosis Date  . Hypertension   . Coronary artery disease   . History of percutaneous left heart catheterization 04/2010  . Obesity   . OSA on CPAP   . Hx of echocardiogram 05/2009    EF 40-45%, he did have mild annular calcification with mild-to-moderate MR and mild TR as well as aortic valve sclerosis. Estimated RV systolic pressure was 21 mm.  . History of stress test 06/2011    No significant ischemia, this is a low risk scan. Clinical correlation recommended Abnormal myocardial perfusion study.  Marland Kitchen RBBB   . BPH (benign prostatic hyperplasia)     Medications:  Prescriptions prior to admission  Medication Sig Dispense Refill Last Dose  . apixaban (ELIQUIS) 5 MG TABS tablet Take 10 mg by mouth 2 (two) times daily.   12/01/2013 at Unknown time  . atorvastatin (LIPITOR) 10 MG tablet Take 10 mg by mouth daily.   12/01/2013 at Unknown time  . fenofibrate micronized (LOFIBRA) 134 MG capsule Take 134 mg by mouth daily before breakfast.   12/01/2013 at Unknown time  . furosemide (LASIX) 40 MG tablet Take 40 mg by mouth daily.    12/01/2013 at Unknown time  . hydrochlorothiazide (MICROZIDE) 12.5 MG capsule TAKE 1 CAPSULE BY MOUTH DAILY 30 capsule 7  12/01/2013 at Unknown time  . metoprolol (LOPRESSOR) 50 MG tablet Take 1 tablet (50 mg total) by mouth 2 (two) times daily. (Patient taking differently: Take 25 mg by mouth 2 (two) times daily. ) 45 tablet 6 12/01/2013 at 8am  . ranitidine (ZANTAC) 150 MG tablet Take 150 mg by mouth at bedtime.  11/30/2013 at Unknown time  . Tamsulosin HCl (FLOMAX) 0.4 MG CAPS Take 1 capsule (0.4 mg total) by mouth daily. 30 capsule 0 12/01/2013 at Unknown time  . clopidogrel (PLAVIX) 75 MG tablet TAKE 1 TABLET BY MOUTH DAILY (Patient not taking: Reported on 12/01/2013) 30 tablet 9 Not Taking at Unknown time   Assessment: 73yom to start Vancomycin and Ceftriaxone for empiric coverage of fever with unknown source. Patient has had multiple fevers this admission but afebrile last 24hrs. WBC stable ~11, cultures have reported no significant growth. MRI of spine showed inflammation and possible small volume of fluid concerning for paraspinal cellulitis/myositis. IR evaluated patient but could not perform aspiration. [facility] is planning to consult ID.  - Wt: 93kg,  - CrCl 36  Goal of Therapy:  Vancomycin trough level 15-20 mcg/ml  Plan:  1. Vancomycin 1.5g IV x 1, then 1.25g IV q24h 2. Monitor renal function, cultures, clinical course and order Vancomycin trough at [facility]  [id number] 12/05/2013,1:51 PM

## 2013-12-05 NOTE — Progress Notes (Addendum)
[facility] PROGRESS NOTE Assessment/Plan: Syncope: - Unclear etiology. Most likely pre-renal and/or acute PE. Cr is now improving - Held ACE-I and lasix on admission. - Cardiac markers negative. - V/q scan low probability. No events on telemetry. - Echo as below.  Complaining of back pain: - MRI of lumbar as below. - C-spine showed probable pannus about the odontoid process can be seen with CPPD. - New neurological symptoms and cont to have fever. Start empiric antibiotics.  Fever of unclear etiology: - Use tylenol. Cont to spike fevers. - Repeat cultures. Uric acid midly elevated, consult ID. - BCx, urine CX are negative.- MRI of lumbar spine as below. - CBC is pending check ESR cont to be off antibiotics.  Left leg DVT: - cont eliquis. - GFR. 30.  AKI/Chronic Renal insufficiency: - Cr improving with hydration. - chronic, stable, and patient has appointment with nephrologist early next month  OSA on CPAP - cont cpap.   Code Status: full Family Communication: none  Disposition Plan: inpatient   Consultants:  none  Procedures: - V/q scan low probability - Echo 11.24.2015: Features are consistent with a pseudonormal left ventricular filling pattern, with concomitant abnormal relaxation and increased filling pressure (grade 2 diastolic dysfunction). - Pulmonary arteries: Systolic pressure was mildly increased. PA peak pressure: 34 mm Hg. - MRI lumbar spine: Widespread abnormal soft tissue inflammation in the posterior paraspinal soft tissues, primarily the right, surrounding chronically degenerated lumbar facets. Small volume of more free appearing fluid tracking along the right iliacus muscle. No marrow edema to suggest a septic facet joint at this time, however, recommend correlation with blood cultures as the appearance is suspicious for paraspinal cellulitis/myositis in this setting.  Widespread degenerative lumbar spinal stenosis ranging from mild  (L2-L3) to severe (L4-L5 and L5-S1).  If the patient's fever source remains occult, recommend also repeat MRI (without and with contrast preferred) of the cervicothoracic junction where similar soft tissue inflammation and nonspecific posterior epidural signal was demonstrated on 12/01/2013.  Antibiotics:  None  HPI/Subjective: Cont to have back pain, now with numbness  Of arms bilaterally  Objective: Filed Vitals:   12/04/13 2035 12/04/13 2210 12/04/13 2238 12/05/13 0426  BP: 157/63  144/68 147/65  Pulse: 85 80 86 79  Temp: 99 F (37.2 C)   99.4 F (37.4 C)  TempSrc: Oral   Oral  Resp: 18 18  18   Height:      Weight:      SpO2: 92% 96%  95%    Intake/Output Summary (Last 24 hours) at 12/05/13 1038 Last data filed at 12/05/13 0900  Gross per 24 hour  Intake    483 ml  Output    630 ml  Net   -147 ml   Filed Weights   12/02/13 0138  Weight: 93.2 kg (205 lb 7.5 oz)    Exam:  General: Alert, awake, oriented x3, in no acute distress.  HEENT: No bruits, no goiter.  Heart: Regular rate and rhythm. Lungs: Good air movement, clear Abdomen: Soft, nontender, nondistended, positive bowel sounds.     Data Reviewed: Basic Metabolic Panel:  Recent Labs Lab 12/01/13 1408 12/02/13 0113 12/03/13 0850 12/04/13 0732 12/05/13 0610  NA 135* 137 136* 138 137  K 3.7 3.5* 3.8 4.0 4.5  CL 97 97 97 99 97  CO2  --  22 24 25 24   GLUCOSE 135* 138* 184* 126* 116*  BUN 42* 42* 44* 46* 46*  CREATININE 2.60* 2.06* 2.13* 1.97* 1.78*  CALCIUM  --  8.6 9.3 9.4 9.6   Liver Function Tests: No results for input(s): AST, ALT, ALKPHOS, BILITOT, PROT, ALBUMIN in the last 168 hours. No results for input(s): LIPASE, AMYLASE in the last 168 hours. No results for input(s): AMMONIA in the last 168 hours. CBC:  Recent Labs Lab 12/01/13 1340 12/01/13 1408 12/02/13 0113 12/04/13 0820  WBC 11.3*  --  11.0* 10.4  NEUTROABS 9.3*  --   --   --   HGB 13.8 15.3 11.7* 12.7*  HCT 42.0 45.0  35.4* 38.5*  MCV 92.7  --  91.9 92.8  PLT 156  --  146* 196   Cardiac Enzymes:  Recent Labs Lab 12/02/13 0030 12/02/13 0113 12/02/13 0649 12/02/13 1350  TROPONINI <0.30 <0.30 <0.30 <0.30   BNP (last 3 results) No results for input(s): PROBNP in the last 8760 hours. CBG:  Recent Labs Lab 12/02/13 0023  GLUCAP 152*    Recent Results (from the past 240 hour(s))  Urine culture     Status: None   Collection Time: 12/01/13  8:39 PM  Result Value Ref Range Status   Specimen Description URINE, CLEAN CATCH  Final   Special Requests NONE  Final   Culture  Setup Time   Final    12/01/2013 23:11 Performed at [facility]   Final    50,000 COLONIES/ML Performed at [facility]    Culture   Final    Multiple bacterial morphotypes present, none predominant. Suggest appropriate recollection if clinically indicated. Performed at [facility]    Report Status 12/03/2013 FINAL  Final  Blood culture (routine x 2)     Status: None (Preliminary result)   Collection Time: 12/02/13 12:30 AM  Result Value Ref Range Status   Specimen Description BLOOD LEFT ANTECUBITAL  Final   Special Requests BOTTLES DRAWN AEROBIC AND ANAEROBIC 5CC EA  Final   Culture  Setup Time   Final    12/02/2013 04:45 Performed at [facility]    Culture   Final           BLOOD CULTURE RECEIVED NO GROWTH TO DATE CULTURE WILL BE HELD FOR 5 DAYS BEFORE ISSUING A FINAL NEGATIVE REPORT Performed at [facility]    Report Status PENDING  Incomplete  Blood culture (routine x 2)     Status: None (Preliminary result)   Collection Time: 12/02/13  1:13 AM  Result Value Ref Range Status   Specimen Description BLOOD LEFT HAND  Final   Special Requests BOTTLES DRAWN AEROBIC AND ANAEROBIC 5CC EA  Final   Culture  Setup Time   Final    12/02/2013 10:36 Performed at [facility]    Culture   Final           BLOOD CULTURE RECEIVED NO GROWTH TO DATE CULTURE  WILL BE HELD FOR 5 DAYS BEFORE ISSUING A FINAL NEGATIVE REPORT Performed at [facility]    Report Status PENDING  Incomplete  Urine culture     Status: None   Collection Time: 12/03/13  9:21 AM  Result Value Ref Range Status   Specimen Description URINE, CLEAN CATCH  Final   Special Requests none  Final   Culture  Setup Time   Final    12/03/2013 18:23 Performed at [facility]   Final    25,000 COLONIES/ML Performed at [facility]   Final  Multiple bacterial morphotypes present, none predominant. Suggest appropriate recollection if clinically indicated. Performed at [facility]    Report Status 12/05/2013 FINAL  Final  Culture, blood (routine x 2)     Status: None (Preliminary result)   Collection Time: 12/04/13  9:30 AM  Result Value Ref Range Status   Specimen Description BLOOD RIGHT HAND  Final   Special Requests BOTTLES DRAWN AEROBIC ONLY [facility]  Final   Culture  Setup Time   Final    12/04/2013 14:09 Performed at [facility]    Culture   Final           BLOOD CULTURE RECEIVED NO GROWTH TO DATE CULTURE WILL BE HELD FOR 5 DAYS BEFORE ISSUING A FINAL NEGATIVE REPORT Performed at [facility]    Report Status PENDING  Incomplete  Culture, blood (routine x 2)     Status: None (Preliminary result)   Collection Time: 12/04/13  9:50 AM  Result Value Ref Range Status   Specimen Description BLOOD LEFT HAND  Final   Special Requests BOTTLES DRAWN AEROBIC ONLY 3CC  Final   Culture  Setup Time   Final    12/04/2013 14:05 Performed at [facility]    Culture   Final           BLOOD CULTURE RECEIVED NO GROWTH TO DATE CULTURE WILL BE HELD FOR 5 DAYS BEFORE ISSUING A FINAL NEGATIVE REPORT Performed at [facility]    Report Status PENDING  Incomplete     Studies: Mr Lumbar Spine Wo Contrast  12/03/2013   CLINICAL DATA:  74 year old male with back pain and fever. Initial encounter.   EXAM: MRI LUMBAR SPINE WITHOUT CONTRAST  TECHNIQUE: Multiplanar, multisequence MR imaging of the lumbar spine was performed. No intravenous contrast was administered.  COMPARISON:  Thoracic and cervical MRI 12/01/2013.  FINDINGS: Same numbering system used as on the recent thoracic comparison, which results in normal lumbar segmentation. Lumbar vertebral height and alignment within normal limits. No marrow edema or evidence of acute osseous abnormality.  Fairly extensive abnormal increased T2 and STIR signal in the posterior paraspinal soft tissues greater on the right. Soft tissues surrounding the chronically degenerated lumbar facet are affected, as well as some of the erector spine I muscles (primarily the right). See series 5, image 7. Posteriorly situated synovial cysts at most levels. No fluid collections suspicious for abscess identified.  However, there is fluid tracking along the right iliacus muscle seen only on axial images (series 6, image 15).  No epidural inflammation identified.  Negative visualized abdominal viscera. Visualized lower thoracic spinal cord is normal with conus medularis at L1.  T12-L1:  Mild facet hypertrophy.  L1-L2:  Mild facet hypertrophy.  L2-L3: Circumferential disc bulge. Moderate to severe facet hypertrophy. Mild spinal stenosis (series 6, image 10).  L3-L4: Circumferential disc bulge. Severe facet hypertrophy. Mild to moderate spinal and bilateral L3 foraminal stenosis.  L4-L5: Circumferential disc bulge. Severe facet and ligament flavum hypertrophy. Severe spinal stenosis (series 6, image 23). Mild to moderate bilateral L4 foraminal stenosis.  L5-S1: Epidural lipomatosis. Circumferential disc osteophyte complex. Severe facet hypertrophy. Severe spinal stenosis (series 6, image 29). Capacious facets with joint fluid. Multiple subchondral cysts on the right (series 6, image 29). Moderate left and mild right L5 foraminal stenosis.  Visible sacrum intact.  IMPRESSION: 1.  Widespread abnormal soft tissue inflammation in the posterior paraspinal soft tissues, primarily the right, surrounding chronically degenerated lumbar facets. Small volume of more  free appearing fluid tracking along the right iliacus muscle. 2. No marrow edema to suggest a septic facet joint at this time, however, recommend correlation with blood cultures as the appearance is suspicious for paraspinal cellulitis/myositis in this setting. 3. Widespread degenerative lumbar spinal stenosis ranging from mild (L2-L3) to severe (L4-L5 and L5-S1). 4. If the patient's fever source remains occult, recommend also repeat MRI (without and with contrast preferred) of the cervicothoracic junction where similar soft tissue inflammation and nonspecific posterior epidural signal was demonstrated on 12/01/2013.   Electronically Signed   By: Lars Pinks M.D.   On: 12/03/2013 19:45    Scheduled Meds: . apixaban  5 mg Oral BID  . atorvastatin  10 mg Oral Daily  . famotidine  20 mg Oral QHS  . metoprolol  25 mg Oral BID  . sodium chloride  3 mL Intravenous Q12H  . tamsulosin  0.4 mg Oral Daily   Continuous Infusions: . sodium chloride       Charlynne Cousins  [facility] Pager [phone]. If 8PM-8AM, please contact night-coverage at [email or web address], password [facility] 12/05/2013, 10:38 AM  LOS: 4 days

## 2013-12-05 NOTE — Progress Notes (Signed)
UR completed 

## 2013-12-05 NOTE — Progress Notes (Addendum)
ANTICOAGULATION CONSULT NOTE - Initial Consult  Pharmacy Consult for Heparin Indication: DVT  Allergies  Allergen Reactions  . Nitrostat [Nitroglycerin]     Causes blood pressure to "bottom out"    Patient Measurements: Height: 5\' 3"  (160 cm) Weight: 205 lb 7.5 oz (93.2 kg) IBW/kg (Calculated) : 56.9 Heparin Dosing Weight:  80 kg  Vital Signs: Temp: 99.4 F (37.4 C) (11/27 0426) Temp Source: Oral (11/27 0426) BP: 147/65 mmHg (11/27 0426) Pulse Rate: 79 (11/27 0426)  Labs:  Recent Labs  12/02/13 1350 12/03/13 0850 12/04/13 0732 12/04/13 0820 12/05/13 0610  HGB  --   --   --  12.7*  --   HCT  --   --   --  38.5*  --   PLT  --   --   --  196  --   CREATININE  --  2.13* 1.97*  --  1.78*  TROPONINI <0.30  --   --   --   --     Estimated Creatinine Clearance: 36.2 mL/min (by C-G formula based on Cr of 1.78).   Medical History: Past Medical History  Diagnosis Date  . Hypertension   . Coronary artery disease   . History of percutaneous left heart catheterization 04/2010  . Obesity   . OSA on CPAP   . Hx of echocardiogram 05/2009    EF 40-45%, he did have mild annular calcification with mild-to-moderate MR and mild TR as well as aortic valve sclerosis. Estimated RV systolic pressure was 21 mm.  . History of stress test 06/2011    No significant ischemia, this is a low risk scan. Clinical correlation recommended Abnormal myocardial perfusion study.  Marland Kitchen RBBB   . BPH (benign prostatic hyperplasia)     Medications:  Scheduled:  . atorvastatin  10 mg Oral Daily  . famotidine  20 mg Oral QHS  . metoprolol  25 mg Oral BID  . sodium chloride  3 mL Intravenous Q12H  . tamsulosin  0.4 mg Oral Daily    Assessment: 73 yo male with LLE DVT, Eliquis on hold for procedure tomorrow, for heparin. Last dose of Eliquis at 2240 last night   Goal of Therapy:  APTT 66-102 Heparin level 0.3-0.7 units/ml Monitor platelets by anticoagulation protocol: Yes   Plan:   Start  heparin 1100 units/hr at 1200 Check aPTT in 8 hours.   Caryl Pina 12/05/2013,7:28 AM    Addendum: Pharmacy was consulted to transition apixaban to heparin for IR procedure. Per IR note, cannot perform aspiration procedure. Discussed with [facility] Olevia Bowens - discontinue heparin transition and restart apixaban.  Plan: 1. Discontinue heparin orders 2. Restart Apixaban 5mg  PO BID (no doses missed)  Janina Mayo, PharmD Clinical Pharmacist [phone] 12/05/2013, 10:01 AM

## 2013-12-05 NOTE — Progress Notes (Signed)
PT Cancellation Note  Patient Details Name: Paul Price MRN: [id number] DOB: 23-Feb-1940   Cancelled Treatment:    Reason Eval/Treat Not Completed: Fatigue/lethargy limiting ability to participate  Pt reports he is unable to work with therapy today due to weakness and fatigue. States he has been performing exercises but declines to perform with PT at this time. Will follow up as time allows.   [place], Page  Ellouise Newer 12/05/2013, 11:51 AM

## 2013-12-05 NOTE — Consult Note (Signed)
[facility]    Date of Admission:  12/01/2013          Reason for Consult: Fever and focal right paraspinous myositis    Referring Physician: Dr. Charlynne Cousins Primary Care Physician: Dr. [name]  Principal Problem:   Fever Active Problems:   Focal myositis   CAD - CABG '92, LAD DES 4/12, low risk Myoview 6/13   HTN (hypertension)   Hyperlipidemia with target LDL less than 70   OSA on CPAP   Renal insufficiency   Syncope   Left leg DVT   . apixaban  5 mg Oral BID  . atorvastatin  10 mg Oral Daily  . cefTRIAXone (ROCEPHIN)  IV  1 g Intravenous Q24H  . famotidine  20 mg Oral QHS  . HYDROcodone-acetaminophen  2 tablet Oral Q4H  . metoprolol  25 mg Oral BID  . sodium chloride  3 mL Intravenous Q12H  . tamsulosin  0.4 mg Oral Daily  . [START ON 12/06/2013] vancomycin  1,250 mg Intravenous Q24H  . vancomycin  1,500 mg Intravenous Once    Recommendations: 1. Agree with starting empiric vancomycin and ceftriaxone 2. PICC versus IR to place central catheter   Assessment: I'm not actually certain what is causing the inflammation seen on his MRI but with his recent fevers I am certainly worried about infectious myositis. With negative blood cultures and no large fluid collection to sample we will need to treat with empiric antibiotics to cover for staph, strep and gram-negative rods. I will follow-up with you this weekend.    HPI: Paul Price is a 73 y.o. male who was recently diagnosed with a left leg DVT and started on apixaban. He then began to develop right-sided back pain. He has not had any trauma or injury. His pain became progressively worse to a level of 10+ out of 10 leading to admission. On admission he had a temperature of 102.2. He was not aware of having any fever before admission. He has not been treated for any infections recently and has had no need to be on antibiotics recently. Admission blood cultures remain  negative. He has defervesced but his MRI shows diffuse muscle inflammation in the right paraspinous muscles. There are only 2 very tiny fluid collections that are too small to aspirate. He has no evidence of discitis or osteomyelitis.   Review of Systems: Constitutional: positive for fevers, negative for anorexia, chills, sweats and weight loss Eyes: negative Ears, nose, mouth, throat, and face: negative Respiratory: negative Cardiovascular: negative Gastrointestinal: negative Genitourinary:negative  Past Medical History  Diagnosis Date  . Hypertension   . Coronary artery disease   . History of percutaneous left heart catheterization 04/2010  . Obesity   . OSA on CPAP   . Hx of echocardiogram 05/2009    EF 40-45%, he did have mild annular calcification with mild-to-moderate MR and mild TR as well as aortic valve sclerosis. Estimated RV systolic pressure was 21 mm.  . History of stress test 06/2011    No significant ischemia, this is a low risk scan. Clinical correlation recommended Abnormal myocardial perfusion study.  Marland Kitchen RBBB   . BPH (benign prostatic hyperplasia)     History  Substance Use Topics  . Smoking status: Former Research scientist (life sciences)  . Smokeless tobacco: Never Used     Comment: quit ~ 34 years ago  . Alcohol Use: No    Family History  Problem  Relation Age of Onset  . Heart disease Father    Allergies  Allergen Reactions  . Nitrostat [Nitroglycerin]     Causes blood pressure to "bottom out"    OBJECTIVE: Blood pressure 147/65, pulse 79, temperature 99.4 F (37.4 C), temperature source Oral, resp. rate 18, height 5\' 3"  (1.6 m), weight 205 lb 7.5 oz (93.2 kg), SpO2 95 %. General: He is uncomfortable due to pain laying very still in bed Skin: His face is flushed but he has no rash Lungs: Clear Cor: Distant but regular S1 and S2 with no murmur Abdomen: Soft and nontender No obvious asymmetry over back. Tender to palpation  Lab Results Lab Results  Component Value Date    WBC 10.4 12/04/2013   HGB 12.7* 12/04/2013   HCT 38.5* 12/04/2013   MCV 92.8 12/04/2013   PLT 196 12/04/2013    Lab Results  Component Value Date   CREATININE 1.78* 12/05/2013   BUN 46* 12/05/2013   NA 137 12/05/2013   K 4.5 12/05/2013   CL 97 12/05/2013   CO2 24 12/05/2013    Lab Results  Component Value Date   ALT 24 07/14/2013   AST 27 07/14/2013   ALKPHOS 59 07/14/2013   BILITOT 0.4 07/14/2013     Microbiology: Recent Results (from the past 240 hour(s))  Urine culture     Status: None   Collection Time: 12/01/13  8:39 PM  Result Value Ref Range Status   Specimen Description URINE, CLEAN CATCH  Final   Special Requests NONE  Final   Culture  Setup Time   Final    12/01/2013 23:11 Performed at [facility]   Final    50,000 COLONIES/ML Performed at [facility]    Culture   Final    Multiple bacterial morphotypes present, none predominant. Suggest appropriate recollection if clinically indicated. Performed at [facility]    Report Status 12/03/2013 FINAL  Final  Blood culture (routine x 2)     Status: None (Preliminary result)   Collection Time: 12/02/13 12:30 AM  Result Value Ref Range Status   Specimen Description BLOOD LEFT ANTECUBITAL  Final   Special Requests BOTTLES DRAWN AEROBIC AND ANAEROBIC 5CC EA  Final   Culture  Setup Time   Final    12/02/2013 04:45 Performed at [facility]    Culture   Final           BLOOD CULTURE RECEIVED NO GROWTH TO DATE CULTURE WILL BE HELD FOR 5 DAYS BEFORE ISSUING A FINAL NEGATIVE REPORT Performed at [facility]    Report Status PENDING  Incomplete  Blood culture (routine x 2)     Status: None (Preliminary result)   Collection Time: 12/02/13  1:13 AM  Result Value Ref Range Status   Specimen Description BLOOD LEFT HAND  Final   Special Requests BOTTLES DRAWN AEROBIC AND ANAEROBIC 5CC EA  Final   Culture  Setup Time   Final    12/02/2013 10:36 Performed  at [facility]    Culture   Final           BLOOD CULTURE RECEIVED NO GROWTH TO DATE CULTURE WILL BE HELD FOR 5 DAYS BEFORE ISSUING A FINAL NEGATIVE REPORT Performed at [facility]    Report Status PENDING  Incomplete  Urine culture     Status: None   Collection Time: 12/03/13  9:21 AM  Result Value Ref Range Status  Specimen Description URINE, CLEAN CATCH  Final   Special Requests none  Final   Culture  Setup Time   Final    12/03/2013 18:23 Performed at [place]   Final    25,000 COLONIES/ML Performed at [facility]    Culture   Final    Multiple bacterial morphotypes present, none predominant. Suggest appropriate recollection if clinically indicated. Performed at [facility]    Report Status 12/05/2013 FINAL  Final  Culture, blood (routine x 2)     Status: None (Preliminary result)   Collection Time: 12/04/13  9:30 AM  Result Value Ref Range Status   Specimen Description BLOOD RIGHT HAND  Final   Special Requests BOTTLES DRAWN AEROBIC ONLY [facility]  Final   Culture  Setup Time   Final    12/04/2013 14:09 Performed at [facility]    Culture   Final           BLOOD CULTURE RECEIVED NO GROWTH TO DATE CULTURE WILL BE HELD FOR 5 DAYS BEFORE ISSUING A FINAL NEGATIVE REPORT Performed at [facility]    Report Status PENDING  Incomplete  Culture, blood (routine x 2)     Status: None (Preliminary result)   Collection Time: 12/04/13  9:50 AM  Result Value Ref Range Status   Specimen Description BLOOD LEFT HAND  Final   Special Requests BOTTLES DRAWN AEROBIC ONLY [facility]  Final   Culture  Setup Time   Final    12/04/2013 14:05 Performed at [facility]    Culture   Final           BLOOD CULTURE RECEIVED NO GROWTH TO DATE CULTURE WILL BE HELD FOR 5 DAYS BEFORE ISSUING A FINAL NEGATIVE REPORT Performed at [facility]    Report Status PENDING  Incomplete    Michel Bickers, MD [facility] [phone] pager   [phone] cell 12/05/2013, 3:13 PM

## 2013-12-06 DIAGNOSIS — M6089 Other myositis, multiple sites: Secondary | ICD-10-CM

## 2013-12-06 LAB — BASIC METABOLIC PANEL
Anion gap: 15 (ref 5–15)
BUN: 49 mg/dL — ABNORMAL HIGH (ref 6–23)
CO2: 23 mEq/L (ref 19–32)
Calcium: 9.3 mg/dL (ref 8.4–10.5)
Chloride: 98 mEq/L (ref 96–112)
Creatinine, Ser: 1.7 mg/dL — ABNORMAL HIGH (ref 0.50–1.35)
GFR calc Af Amer: 44 mL/min — ABNORMAL LOW (ref >90)
GFR calc non Af Amer: 38 mL/min — ABNORMAL LOW (ref >90)
Glucose, Bld: 115 mg/dL — ABNORMAL HIGH (ref 70–99)
Potassium: 4.2 mEq/L (ref 3.7–5.3)
Sodium: 136 mEq/L — ABNORMAL LOW (ref 137–147)

## 2013-12-06 MED ORDER — SODIUM CHLORIDE 0.9 % IV BOLUS (SEPSIS)
500.0000 mL | Freq: Once | INTRAVENOUS | Status: AC
  Administered 2013-12-06: 500 mL via INTRAVENOUS

## 2013-12-06 MED ORDER — SODIUM CHLORIDE 0.9 % IJ SOLN
10.0000 mL | INTRAMUSCULAR | Status: DC | PRN
  Administered 2013-12-07 – 2013-12-09 (×2): 10 mL
  Filled 2013-12-06 (×2): qty 40

## 2013-12-06 NOTE — Progress Notes (Signed)
Patient ID: Paul Price, male   DOB: 1940-07-26, 73 y.o.   MRN: [id number]         [facility]    Date of Admission:  12/01/2013           Day 2 vancomycin        Day 2 ceftriaxone  Principal Problem:   Fever Active Problems:   Focal myositis   CAD - CABG '92, LAD DES 4/12, low risk Myoview 6/13   HTN (hypertension)   Hyperlipidemia with target LDL less than 70   OSA on CPAP   Renal insufficiency   Syncope   Left leg DVT   . apixaban  5 mg Oral BID  . atorvastatin  10 mg Oral Daily  . cefTRIAXone (ROCEPHIN)  IV  1 g Intravenous Q24H  . famotidine  20 mg Oral QHS  . HYDROcodone-acetaminophen  2 tablet Oral Q4H  . metoprolol  25 mg Oral BID  . sodium chloride  3 mL Intravenous Q12H  . tamsulosin  0.4 mg Oral Daily  . vancomycin  1,250 mg Intravenous Q24H    Subjective: He is feeling a little bit better today. His back pain has improved and he was able to sit up in a chair for a few hours this morning. His appetite remains poor.  Review of Systems: Pertinent items are noted in HPI.  Past Medical History  Diagnosis Date  . Hypertension   . Coronary artery disease   . History of percutaneous left heart catheterization 04/2010  . Obesity   . OSA on CPAP   . Hx of echocardiogram 05/2009    EF 40-45%, he did have mild annular calcification with mild-to-moderate MR and mild TR as well as aortic valve sclerosis. Estimated RV systolic pressure was 21 mm.  . History of stress test 06/2011    No significant ischemia, this is a low risk scan. Clinical correlation recommended Abnormal myocardial perfusion study.  Marland Kitchen RBBB   . BPH (benign prostatic hyperplasia)     History  Substance Use Topics  . Smoking status: Former Research scientist (life sciences)  . Smokeless tobacco: Never Used     Comment: quit ~ 34 years ago  . Alcohol Use: No    Family History  Problem Relation Age of Onset  . Heart disease Father    Allergies  Allergen Reactions  . Nitrostat  [Nitroglycerin]     Causes blood pressure to "bottom out"    OBJECTIVE: Blood pressure 139/66, pulse 76, temperature 97.8 F (36.6 C), temperature source Oral, resp. rate 17, height 5\' 3"  (1.6 m), weight 205 lb 7.5 oz (93.2 kg), SpO2 97 %. General: he appears more comfortable Skin: no rash Lungs: clear Cor: regular S1 and S2 with no murmurs   Lab Results Lab Results  Component Value Date   WBC 10.4 12/04/2013   HGB 12.7* 12/04/2013   HCT 38.5* 12/04/2013   MCV 92.8 12/04/2013   PLT 196 12/04/2013    Lab Results  Component Value Date   CREATININE 1.70* 12/06/2013   BUN 49* 12/06/2013   NA 136* 12/06/2013   K 4.2 12/06/2013   CL 98 12/06/2013   CO2 23 12/06/2013    Lab Results  Component Value Date   ALT 24 07/14/2013   AST 27 07/14/2013   ALKPHOS 59 07/14/2013   BILITOT 0.4 07/14/2013     Microbiology: Recent Results (from the past 240 hour(s))  Urine culture     Status: None  Collection Time: 12/01/13  8:39 PM  Result Value Ref Range Status   Specimen Description URINE, CLEAN CATCH  Final   Special Requests NONE  Final   Culture  Setup Time   Final    12/01/2013 23:11 Performed at [facility]   Final    50,000 COLONIES/ML Performed at [facility]    Culture   Final    Multiple bacterial morphotypes present, none predominant. Suggest appropriate recollection if clinically indicated. Performed at [facility]    Report Status 12/03/2013 FINAL  Final  Blood culture (routine x 2)     Status: None (Preliminary result)   Collection Time: 12/02/13 12:30 AM  Result Value Ref Range Status   Specimen Description BLOOD LEFT ANTECUBITAL  Final   Special Requests BOTTLES DRAWN AEROBIC AND ANAEROBIC 5CC EA  Final   Culture  Setup Time   Final    12/02/2013 04:45 Performed at [facility]    Culture   Final           BLOOD CULTURE RECEIVED NO GROWTH TO DATE CULTURE WILL BE HELD FOR 5 DAYS BEFORE ISSUING A FINAL  NEGATIVE REPORT Performed at [facility]    Report Status PENDING  Incomplete  Blood culture (routine x 2)     Status: None (Preliminary result)   Collection Time: 12/02/13  1:13 AM  Result Value Ref Range Status   Specimen Description BLOOD LEFT HAND  Final   Special Requests BOTTLES DRAWN AEROBIC AND ANAEROBIC 5CC EA  Final   Culture  Setup Time   Final    12/02/2013 10:36 Performed at [facility]    Culture   Final           BLOOD CULTURE RECEIVED NO GROWTH TO DATE CULTURE WILL BE HELD FOR 5 DAYS BEFORE ISSUING A FINAL NEGATIVE REPORT Performed at [facility]    Report Status PENDING  Incomplete  Urine culture     Status: None   Collection Time: 12/03/13  9:21 AM  Result Value Ref Range Status   Specimen Description URINE, CLEAN CATCH  Final   Special Requests none  Final   Culture  Setup Time   Final    12/03/2013 18:23 Performed at [facility]   Final    25,000 COLONIES/ML Performed at [facility]    Culture   Final    Multiple bacterial morphotypes present, none predominant. Suggest appropriate recollection if clinically indicated. Performed at [facility]    Report Status 12/05/2013 FINAL  Final  Culture, blood (routine x 2)     Status: None (Preliminary result)   Collection Time: 12/04/13  9:30 AM  Result Value Ref Range Status   Specimen Description BLOOD RIGHT HAND  Final   Special Requests BOTTLES DRAWN AEROBIC ONLY [facility]  Final   Culture  Setup Time   Final    12/04/2013 14:09 Performed at [facility]    Culture   Final           BLOOD CULTURE RECEIVED NO GROWTH TO DATE CULTURE WILL BE HELD FOR 5 DAYS BEFORE ISSUING A FINAL NEGATIVE REPORT Performed at [facility]    Report Status PENDING  Incomplete  Culture, blood (routine x 2)     Status: None (Preliminary result)   Collection Time: 12/04/13  9:50 AM  Result Value Ref Range Status   Specimen Description  BLOOD LEFT  HAND  Final   Special Requests BOTTLES DRAWN AEROBIC ONLY 3CC  Final   Culture  Setup Time   Final    12/04/2013 14:05 Performed at [facility]    Culture   Final           BLOOD CULTURE RECEIVED NO GROWTH TO DATE CULTURE WILL BE HELD FOR 5 DAYS BEFORE ISSUING A FINAL NEGATIVE REPORT Performed at [facility]    Report Status PENDING  Incomplete    Assessment: He appears to have defervesced and seems to be improving clinically on therapy for 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  Plan: 1. Continue empiric antibiotics 2. PICC 3. Check CK levels  Michel Bickers, [facility] [place] [phone] pager   [phone] cell 12/06/2013, 1:40 PM

## 2013-12-06 NOTE — Progress Notes (Signed)
Attempted to speak with pt regarding PICC.  Pt refuses at this time, wife not available to review risks and benefits.  Will reattempt at later time.

## 2013-12-06 NOTE — Progress Notes (Signed)
UR completed 

## 2013-12-06 NOTE — Progress Notes (Signed)
Pt. Stood up but refused to walk even on steep bed was wet with urine pt. Was unaware a condom cath was placed on pt. To help prevent breakdown

## 2013-12-06 NOTE — Progress Notes (Signed)
[facility] PROGRESS NOTE Assessment/Plan: Syncope: - Unclear etiology. Most likely pre-renal and due to infectious etiology Cr is now improving - Held ACE-I and lasix on admission. - V/q scan low probability. No events on telemetry. - Echo as below.  Complaining of back pain: - MRI of lumbar as below. - New neurological symptoms and cont to have fever. Start empiric antibiotics.  Fever ? Due to myositis - No fever overnight. - Repeat blood cultures negative. consult ID agreed with empiric antibiotic treatment. - BCx, urine CX are negative.- MRI of lumbar spine as below. - ESR > 100, started empiric vanc and rocephin. - Insert picc line.  Left leg DVT: - cont eliquis. - GFR. 30.  AKI/Chronic Renal insufficiency: - Cr improving with hydration. - chronic, stable, and patient has appointment with nephrologist early next month  OSA on CPAP - cont cpap.   Code Status: full Family Communication: none  Disposition Plan: inpatient   Consultants:  none  Procedures: - V/q scan low probability - Echo 11.24.2015: Features are consistent with a pseudonormal left ventricular filling pattern, with concomitant abnormal relaxation and increased filling pressure (grade 2 diastolic dysfunction). - Pulmonary arteries: Systolic pressure was mildly increased. PA peak pressure: 34 mm Hg. - MRI lumbar spine: Widespread abnormal soft tissue inflammation in the posterior paraspinal soft tissues, primarily the right, surrounding chronically degenerated lumbar facets. Small volume of more free appearing fluid tracking along the right iliacus muscle. No marrow edema to suggest a septic facet joint at this time, however, recommend correlation with blood cultures as the appearance is suspicious for paraspinal cellulitis/myositis in this setting.  Widespread degenerative lumbar spinal stenosis ranging from mild (L2-L3) to severe (L4-L5 and L5-S1).  If the patient's fever source remains  occult, recommend also repeat MRI (without and with contrast preferred) of the cervicothoracic junction where similar soft tissue inflammation and nonspecific posterior epidural signal was demonstrated on 12/01/2013.  Antibiotics:  None  HPI/Subjective: Back pain and numbness improved.  Objective: Filed Vitals:   12/05/13 1828 12/05/13 2024 12/05/13 2216 12/06/13 0308  BP: 138/62 142/63  139/66  Pulse: 71 74 76 76  Temp: 98.1 F (36.7 C) 98.2 F (36.8 C)  97.8 F (36.6 C)  TempSrc: Oral Oral  Oral  Resp: _0 Height:      Weight:      SpO2: 95% 97% 97% 97%    Intake/Output Summary (Last 24 hours) at 12/06/13 0953 Last data filed at 12/06/13 0649  Gross per 24 hour  Intake      0 ml  Output    500 ml  Net   -500 ml   Filed Weights   12/02/13 0138  Weight: 93.2 kg (205 lb 7.5 oz)    Exam:  General: Alert, awake, oriented x3, in no acute distress.  HEENT: No bruits, no goiter.  Heart: Regular rate and rhythm. Lungs: Good air movement, clear Abdomen: Soft, nontender, nondistended, positive bowel sounds.     Data Reviewed: Basic Metabolic Panel:  Recent Labs Lab 12/02/13 0113 12/03/13 0850 12/04/13 0732 12/05/13 0610 12/06/13 0235  NA 137 136* 138 137 136*  K 3.5* 3.8 4.0 4.5 4.2  CL 97 97 99 97 98  CO2 _1 GLUCOSE 138* 184* 126* 116* 115*  BUN 42* 44* 46* 46* 49*  CREATININE 2.06* 2.13* 1.97* 1.78* 1.70*  CALCIUM 8.6 9.3 9.4 9.6 9.3   Liver Function Tests: No results for input(s): AST, ALT, ALKPHOS,  BILITOT, PROT, ALBUMIN in the last 168 hours. No results for input(s): LIPASE, AMYLASE in the last 168 hours. No results for input(s): AMMONIA in the last 168 hours. CBC:  Recent Labs Lab 12/01/13 1340 12/01/13 1408 12/02/13 0113 12/04/13 0820  WBC 11.3*  --  11.0* 10.4  NEUTROABS 9.3*  --   --   --   HGB 13.8 15.3 11.7* 12.7*  HCT 42.0 45.0 35.4* 38.5*  MCV 92.7  --  91.9 92.8  PLT 156  --  146* 196   Cardiac  Enzymes:  Recent Labs Lab 12/02/13 0030 12/02/13 0113 12/02/13 0649 12/02/13 1350  TROPONINI <0.30 <0.30 <0.30 <0.30   BNP (last 3 results) No results for input(s): PROBNP in the last 8760 hours. CBG:  Recent Labs Lab 12/02/13 0023  GLUCAP 152*    Recent Results (from the past 240 hour(s))  Urine culture     Status: None   Collection Time: 12/01/13  8:39 PM  Result Value Ref Range Status   Specimen Description URINE, CLEAN CATCH  Final   Special Requests NONE  Final   Culture  Setup Time   Final    12/01/2013 23:11 Performed at [facility]   Final    50,000 COLONIES/ML Performed at [facility]    Culture   Final    Multiple bacterial morphotypes present, none predominant. Suggest appropriate recollection if clinically indicated. Performed at [facility]    Report Status 12/03/2013 FINAL  Final  Blood culture (routine x 2)     Status: None (Preliminary result)   Collection Time: 12/02/13 12:30 AM  Result Value Ref Range Status   Specimen Description BLOOD LEFT ANTECUBITAL  Final   Special Requests BOTTLES DRAWN AEROBIC AND ANAEROBIC 5CC EA  Final   Culture  Setup Time   Final    12/02/2013 04:45 Performed at [facility]    Culture   Final           BLOOD CULTURE RECEIVED NO GROWTH TO DATE CULTURE WILL BE HELD FOR 5 DAYS BEFORE ISSUING A FINAL NEGATIVE REPORT Performed at [facility]    Report Status PENDING  Incomplete  Blood culture (routine x 2)     Status: None (Preliminary result)   Collection Time: 12/02/13  1:13 AM  Result Value Ref Range Status   Specimen Description BLOOD LEFT HAND  Final   Special Requests BOTTLES DRAWN AEROBIC AND ANAEROBIC 5CC EA  Final   Culture  Setup Time   Final    12/02/2013 10:36 Performed at [facility]    Culture   Final           BLOOD CULTURE RECEIVED NO GROWTH TO DATE CULTURE WILL BE HELD FOR 5 DAYS BEFORE ISSUING A FINAL NEGATIVE REPORT Performed  at [facility]    Report Status PENDING  Incomplete  Urine culture     Status: None   Collection Time: 12/03/13  9:21 AM  Result Value Ref Range Status   Specimen Description URINE, CLEAN CATCH  Final   Special Requests none  Final   Culture  Setup Time   Final    12/03/2013 18:23 Performed at [place]   Final    25,000 COLONIES/ML Performed at [facility]    Culture   Final    Multiple bacterial morphotypes present, none predominant. Suggest appropriate recollection if clinically indicated. Performed at [facility]    Report Status 12/05/2013 FINAL  Final  Culture, blood (routine x 2)     Status: None (Preliminary result)   Collection Time: 12/04/13  9:30 AM  Result Value Ref Range Status   Specimen Description BLOOD RIGHT HAND  Final   Special Requests BOTTLES DRAWN AEROBIC ONLY [facility]  Final   Culture  Setup Time   Final    12/04/2013 14:09 Performed at [facility]    Culture   Final           BLOOD CULTURE RECEIVED NO GROWTH TO DATE CULTURE WILL BE HELD FOR 5 DAYS BEFORE ISSUING A FINAL NEGATIVE REPORT Performed at [facility]    Report Status PENDING  Incomplete  Culture, blood (routine x 2)     Status: None (Preliminary result)   Collection Time: 12/04/13  9:50 AM  Result Value Ref Range Status   Specimen Description BLOOD LEFT HAND  Final   Special Requests BOTTLES DRAWN AEROBIC ONLY 3CC  Final   Culture  Setup Time   Final    12/04/2013 14:05 Performed at [facility]    Culture   Final           BLOOD CULTURE RECEIVED NO GROWTH TO DATE CULTURE WILL BE HELD FOR 5 DAYS BEFORE ISSUING A FINAL NEGATIVE REPORT Performed at [facility]    Report Status PENDING  Incomplete     Studies: No results found.  Scheduled Meds: . apixaban  5 mg Oral BID  . atorvastatin  10 mg Oral Daily  . cefTRIAXone (ROCEPHIN)  IV  1 g Intravenous Q24H  . famotidine  20 mg Oral QHS  .  HYDROcodone-acetaminophen  2 tablet Oral Q4H  . metoprolol  25 mg Oral BID  . sodium chloride  3 mL Intravenous Q12H  . tamsulosin  0.4 mg Oral Daily  . vancomycin  1,250 mg Intravenous Q24H   Continuous Infusions:     Charlynne Cousins  [facility] Pager [phone]. If 8PM-8AM, please contact night-coverage at [email or web address], password [facility] 12/06/2013, 9:53 AM  LOS: 5 days

## 2013-12-06 NOTE — Progress Notes (Signed)
Entered pts room to ask about CPAP for the night, pt states he may not wear it tonight but he would call if he wanted it put on. Educated pt on importance of wearing his cpap, he communicates understanding but still declines at this time. Encouraged to call if he changes his mind.

## 2013-12-06 NOTE — Progress Notes (Signed)
Peripherally Inserted Central Catheter/Midline Placement  The IV Nurse has discussed with the patient and/or persons authorized to consent for the patient, the purpose of this procedure and the potential benefits and risks involved with this procedure.  The benefits include less needle sticks, lab draws from the catheter and patient may be discharged home with the catheter.  Risks include, but not limited to, infection, bleeding, blood clot (thrombus formation), and puncture of an artery; nerve damage and irregular heat beat.  Alternatives to this procedure were also discussed.  Wife signed consent due to pt lethargic.  Son and dtr in law also at bedside for consent process. PICC/Midline Placement Documentation  PICC / Midline Single Lumen [id number] PICC Right Basilic 39 cm 0 cm (Active)  Indication for Insertion or Continuance of Line Home intravenous therapies (PICC only);Limited venous access - need for IV therapy >5 days (PICC only) 12/06/2013  4:08 PM  Exposed Catheter (cm) 0 cm 12/06/2013  4:08 PM  Site Assessment Clean;Dry;Intact 12/06/2013  4:08 PM  Line Status Flushed;Saline locked;Blood return noted 12/06/2013  4:08 PM  Dressing Type Transparent 12/06/2013  4:08 PM  Dressing Status Clean;Dry;Intact;Antimicrobial disc in place 12/06/2013  4:08 PM  Line Care Connections checked and tightened 12/06/2013  4:08 PM  Line Adjustment (NICU/IV Team Only) No 12/06/2013  4:08 PM  Dressing Intervention New dressing 12/06/2013  4:08 PM  Dressing Change Due 12/13/13 12/06/2013  4:08 PM       Rolena Infante 12/06/2013, 4:09 PM

## 2013-12-07 DIAGNOSIS — M10072 Idiopathic gout, left ankle and foot: Secondary | ICD-10-CM

## 2013-12-07 DIAGNOSIS — M6080 Other myositis, unspecified site: Secondary | ICD-10-CM

## 2013-12-07 LAB — CBC
HCT: 32 % — ABNORMAL LOW (ref 39.0–52.0)
Hemoglobin: 10.4 g/dL — ABNORMAL LOW (ref 13.0–17.0)
MCH: 29.5 pg (ref 26.0–34.0)
MCHC: 32.5 g/dL (ref 30.0–36.0)
MCV: 90.7 fL (ref 78.0–100.0)
Platelets: 248 10*3/uL (ref 150–400)
RBC: 3.53 MIL/uL — ABNORMAL LOW (ref 4.22–5.81)
RDW: 13.8 % (ref 11.5–15.5)
WBC: 7 10*3/uL (ref 4.0–10.5)

## 2013-12-07 LAB — CK: Total CK: 683 U/L — ABNORMAL HIGH (ref 7–232)

## 2013-12-07 MED ORDER — COLCHICINE 0.6 MG PO TABS
0.6000 mg | ORAL_TABLET | Freq: Two times a day (BID) | ORAL | Status: DC
  Filled 2013-12-07: qty 1

## 2013-12-07 MED ORDER — COLCHICINE 0.6 MG PO TABS
0.6000 mg | ORAL_TABLET | ORAL | Status: DC
  Filled 2013-12-07 (×2): qty 1

## 2013-12-07 MED ORDER — SODIUM CHLORIDE 0.9 % IV BOLUS (SEPSIS): 500.0000 mL | Freq: Once | INTRAVENOUS | Status: DC

## 2013-12-07 MED ORDER — SODIUM CHLORIDE 0.9 % IV SOLN
INTRAVENOUS | Status: DC
  Administered 2013-12-07: 20:00:00 via INTRAVENOUS
  Administered 2013-12-07: 75 mL/h via INTRAVENOUS

## 2013-12-07 MED ORDER — COLCHICINE 0.6 MG PO TABS
0.6000 mg | ORAL_TABLET | Freq: Once | ORAL | Status: DC
  Administered 2013-12-07: 0.6 mg via ORAL

## 2013-12-07 MED ORDER — COLCHICINE 0.6 MG PO TABS
0.6000 mg | ORAL_TABLET | Freq: Once | ORAL | Status: AC
  Administered 2013-12-07: 0.6 mg via ORAL
  Filled 2013-12-07: qty 1

## 2013-12-07 MED ORDER — COLCHICINE 0.6 MG PO TABS: 0.6000 mg | ORAL_TABLET | Freq: Once | ORAL | Status: DC

## 2013-12-07 MED ORDER — COLCHICINE 0.6 MG PO TABS
1.2000 mg | ORAL_TABLET | Freq: Once | ORAL | Status: AC
  Administered 2013-12-07: 1.2 mg via ORAL
  Filled 2013-12-07: qty 2

## 2013-12-07 NOTE — Progress Notes (Signed)
[facility] PROGRESS NOTE Assessment/Plan: Syncope: - Unclear etiology. Most likely pre-renal and due to infectious etiology Cr is now improving. - Held ACE-I and lasix on admission. - V/q scan low probability. No events on telemetry. - Echo as below.  New onset Acute 1st toe and left 3rd phalange pain ? Due to gout: - has remained afebrile since antibiotics started. - Exquisitely tender to palpation and warm to touch. - uric acid was high he does have a h/o gout. - start colchicine orally, try to avoid steroids.  Fever ? Due to myositis/ostemyelitis/Back pain: - No fever overnight. Empiric antibiotics started on 11.27.2015, appreciate ID assistance. - BCx x2, urine CX are negative.- MRI of lumbar spine as below. - ESR > 100, started empiric vanc and rocephin. - Insert picc line 11.28.2015.  Left leg DVT: - cont eliquis. - GFR. 30.  AKI/Chronic Renal insufficiency: - Cr improving with hydration. Repeat b-met in am. - chronic, stable, and patient has appointment with nephrologist early next month  OSA on CPAP - cont cpap.   Code Status: full Family Communication: none  Disposition Plan: inpatient   Consultants:  none  Procedures: - V/q scan low probability - Echo 11.24.2015: Features are consistent with a pseudonormal left ventricular filling pattern, with concomitant abnormal relaxation and increased filling pressure (grade 2 diastolic dysfunction). - Pulmonary arteries: Systolic pressure was mildly increased. PA peak pressure: 34 mm Hg. - MRI lumbar spine: Widespread abnormal soft tissue inflammation in the posterior paraspinal soft tissues, primarily the right, surrounding chronically degenerated lumbar facets. Small volume of more free appearing fluid tracking along the right iliacus muscle. No marrow edema to suggest a septic facet joint at this time, however, recommend correlation with blood cultures as the appearance is suspicious for paraspinal  cellulitis/myositis in this setting.  Widespread degenerative lumbar spinal stenosis ranging from mild (L2-L3) to severe (L4-L5 and L5-S1).  If the patient's fever source remains occult, recommend also repeat MRI (without and with contrast preferred) of the cervicothoracic junction where similar soft tissue inflammation and nonspecific posterior epidural signal was demonstrated on 12/01/2013.  Antibiotics:  None  HPI/Subjective: Back pain improved. Now with podagra and left hand swelling and pain.  Objective: Filed Vitals:   12/06/13 1408 12/06/13 1700 12/06/13 2019 12/07/13 0428  BP: 160/68 142/78 136/67 149/68  Pulse: 92 88 80 76  Temp: 99.8 F (37.7 C) 99.2 F (37.3 C) 98.8 F (37.1 C) 98.2 F (36.8 C)  TempSrc: Oral  Oral Oral  Resp: _0 Height:      Weight:      SpO2: 95% 96% 95% 96%    Intake/Output Summary (Last 24 hours) at 12/07/13 0900 Last data filed at 12/06/13 1829  Gross per 24 hour  Intake    370 ml  Output    600 ml  Net   -230 ml   Filed Weights   12/02/13 0138  Weight: 93.2 kg (205 lb 7.5 oz)    Exam:  General: Alert, awake, oriented x3, in no acute distress.  HEENT: No bruits, no goiter.  Heart: Regular rate and rhythm. Lungs: Good air movement, clear Abdomen: Soft, nontender, nondistended, positive bowel sounds.   Data Reviewed: Basic Metabolic Panel:  Recent Labs Lab 12/02/13 0113 12/03/13 0850 12/04/13 0732 12/05/13 0610 12/06/13 0235  NA 137 136* 138 137 136*  K 3.5* 3.8 4.0 4.5 4.2  CL 97 97 99 97 98  CO2 _1 GLUCOSE 138* 184*  126* 116* 115*  BUN 42* 44* 46* 46* 49*  CREATININE 2.06* 2.13* 1.97* 1.78* 1.70*  CALCIUM 8.6 9.3 9.4 9.6 9.3   Liver Function Tests: No results for input(s): AST, ALT, ALKPHOS, BILITOT, PROT, ALBUMIN in the last 168 hours. No results for input(s): LIPASE, AMYLASE in the last 168 hours. No results for input(s): AMMONIA in the last 168 hours. CBC:  Recent Labs Lab  12/01/13 1340 12/01/13 1408 12/02/13 0113 12/04/13 0820 12/07/13 0435  WBC 11.3*  --  11.0* 10.4 7.0  NEUTROABS 9.3*  --   --   --   --   HGB 13.8 15.3 11.7* 12.7* 10.4*  HCT 42.0 45.0 35.4* 38.5* 32.0*  MCV 92.7  --  91.9 92.8 90.7  PLT 156  --  146* 196 248   Cardiac Enzymes:  Recent Labs Lab 12/02/13 0030 12/02/13 0113 12/02/13 0649 12/02/13 1350 12/07/13 0435  CKTOTAL  --   --   --   --  48*  TROPONINI <0.30 <0.30 <0.30 <0.30  --    BNP (last 3 results) No results for input(s): PROBNP in the last 8760 hours. CBG:  Recent Labs Lab 12/02/13 0023  GLUCAP 152*    Recent Results (from the past 240 hour(s))  Urine culture     Status: None   Collection Time: 12/01/13  8:39 PM  Result Value Ref Range Status   Specimen Description URINE, CLEAN CATCH  Final   Special Requests NONE  Final   Culture  Setup Time   Final    12/01/2013 23:11 Performed at [place]   Final    50,000 COLONIES/ML Performed at [facility]    Culture   Final    Multiple bacterial morphotypes present, none predominant. Suggest appropriate recollection if clinically indicated. Performed at [facility]    Report Status 12/03/2013 FINAL  Final  Blood culture (routine x 2)     Status: None (Preliminary result)   Collection Time: 12/02/13 12:30 AM  Result Value Ref Range Status   Specimen Description BLOOD LEFT ANTECUBITAL  Final   Special Requests BOTTLES DRAWN AEROBIC AND ANAEROBIC 5CC EA  Final   Culture  Setup Time   Final    12/02/2013 04:45 Performed at [facility]    Culture   Final           BLOOD CULTURE RECEIVED NO GROWTH TO DATE CULTURE WILL BE HELD FOR 5 DAYS BEFORE ISSUING A FINAL NEGATIVE REPORT Performed at [facility]    Report Status PENDING  Incomplete  Blood culture (routine x 2)     Status: None (Preliminary result)   Collection Time: 12/02/13  1:13 AM  Result Value Ref Range Status   Specimen  Description BLOOD LEFT HAND  Final   Special Requests BOTTLES DRAWN AEROBIC AND ANAEROBIC 5CC EA  Final   Culture  Setup Time   Final    12/02/2013 10:36 Performed at [facility]    Culture   Final           BLOOD CULTURE RECEIVED NO GROWTH TO DATE CULTURE WILL BE HELD FOR 5 DAYS BEFORE ISSUING A FINAL NEGATIVE REPORT Performed at [facility]    Report Status PENDING  Incomplete  Urine culture     Status: None   Collection Time: 12/03/13  9:21 AM  Result Value Ref Range Status   Specimen Description URINE, CLEAN CATCH  Final   Special  Requests none  Final   Culture  Setup Time   Final    12/03/2013 18:23 Performed at [place]   Final    25,000 COLONIES/ML Performed at [facility]    Culture   Final    Multiple bacterial morphotypes present, none predominant. Suggest appropriate recollection if clinically indicated. Performed at [facility]    Report Status 12/05/2013 FINAL  Final  Culture, blood (routine x 2)     Status: None (Preliminary result)   Collection Time: 12/04/13  9:30 AM  Result Value Ref Range Status   Specimen Description BLOOD RIGHT HAND  Final   Special Requests BOTTLES DRAWN AEROBIC ONLY [facility]  Final   Culture  Setup Time   Final    12/04/2013 14:09 Performed at [facility]    Culture   Final           BLOOD CULTURE RECEIVED NO GROWTH TO DATE CULTURE WILL BE HELD FOR 5 DAYS BEFORE ISSUING A FINAL NEGATIVE REPORT Performed at [facility]    Report Status PENDING  Incomplete  Culture, blood (routine x 2)     Status: None (Preliminary result)   Collection Time: 12/04/13  9:50 AM  Result Value Ref Range Status   Specimen Description BLOOD LEFT HAND  Final   Special Requests BOTTLES DRAWN AEROBIC ONLY 3CC  Final   Culture  Setup Time   Final    12/04/2013 14:05 Performed at [facility]    Culture   Final           BLOOD CULTURE RECEIVED NO GROWTH TO DATE CULTURE  WILL BE HELD FOR 5 DAYS BEFORE ISSUING A FINAL NEGATIVE REPORT Performed at [facility]    Report Status PENDING  Incomplete     Studies: No results found.  Scheduled Meds: . apixaban  5 mg Oral BID  . atorvastatin  10 mg Oral Daily  . cefTRIAXone (ROCEPHIN)  IV  1 g Intravenous Q24H  . colchicine  0.6 mg Oral Q1 Hr x 2  . colchicine  0.6 mg Oral BID  . colchicine  1.2 mg Oral Once  . famotidine  20 mg Oral QHS  . HYDROcodone-acetaminophen  2 tablet Oral Q4H  . metoprolol  25 mg Oral BID  . sodium chloride  500 mL Intravenous Once  . sodium chloride  3 mL Intravenous Q12H  . tamsulosin  0.4 mg Oral Daily  . vancomycin  1,250 mg Intravenous Q24H   Continuous Infusions: . sodium chloride       Charlynne Cousins  [facility] Pager [phone]. If 8PM-8AM, please contact night-coverage at [email or web address], password [facility] 12/07/2013, 9:00 AM  LOS: 6 days

## 2013-12-07 NOTE — Progress Notes (Signed)
Placed pt. On cpap. Pt. Tolerating well at this time. 

## 2013-12-07 NOTE — Progress Notes (Signed)
Patient ID: Paul Price, male   DOB: 1940-09-05, 73 y.o.   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 apixaban  5 mg Oral BID  . atorvastatin  10 mg Oral Daily  . cefTRIAXone (ROCEPHIN)  IV  1 g Intravenous Q24H  . famotidine  20 mg Oral QHS  . HYDROcodone-acetaminophen  2 tablet Oral Q4H  . metoprolol  25 mg Oral BID  . sodium chloride  3 mL Intravenous Q12H  . tamsulosin  0.4 mg Oral Daily  . vancomycin  1,250 mg Intravenous Q24H    Subjective: He states that overall he is feeling better. His back pain has improved significantly. However he has developed acute pain in his right great toe and left long finger overnight. He believes it is a flare of his gout.  Review of Systems: Pertinent items are noted in HPI.  Past Medical History  Diagnosis Date  . Hypertension   . Coronary artery disease   . History of percutaneous left heart catheterization 04/2010  . Obesity   . OSA on CPAP   . Hx of echocardiogram 05/2009    EF 40-45%, he did have mild annular calcification with mild-to-moderate MR and mild TR as well as aortic valve sclerosis. Estimated RV systolic pressure was 21 mm.  . History of stress test 06/2011    No significant ischemia, this is a low risk scan. Clinical correlation recommended Abnormal myocardial perfusion study.  Marland Kitchen RBBB   . BPH (benign prostatic hyperplasia)     History  Substance Use Topics  . Smoking status: Former Research scientist (life sciences)  . Smokeless tobacco: Never Used     Comment: quit ~ 34 years ago  . Alcohol Use: No    Family History  Problem Relation Age of Onset  . Heart disease Father     Allergies  Allergen Reactions  . Nitrostat [Nitroglycerin]     Causes blood pressure to "bottom out"    OBJECTIVE: Blood pressure 145/72, pulse 76, temperature 98.3 F (36.8 C), temperature source Oral, resp. rate 18, height 5\' 3"  (1.6 m), weight 205 lb 7.5 oz (93.2 kg), SpO2 95 %. General: he appears to be slightly confused today but does appear more comfortable. He can roll over and sit up in bed without back pain. Skin: no acute rash Lungs: clear Cor: regular S1 and S2 with no murmurs Abdomen: soft and nontender Back: No pain with palpation of the right paraspinous muscles. No swelling, redness or asymmetry Joints and extremities: He does have some painful swelling of his left long finger and right great toe  Lab Results Lab Results  Component Value Date   WBC 7.0 12/07/2013   HGB 10.4* 12/07/2013   HCT 32.0* 12/07/2013   MCV 90.7 12/07/2013   PLT 248 12/07/2013    Lab Results  Component Value Date   CREATININE 1.70* 12/06/2013   BUN 49* 12/06/2013   NA 136* 12/06/2013   K 4.2  12/06/2013   CL 98 12/06/2013   CO2 23 12/06/2013    Lab Results  Component Value Date   ALT 24 07/14/2013   AST 27 07/14/2013   ALKPHOS 59 07/14/2013   BILITOT 0.4 07/14/2013     Microbiology: Recent Results (from the past 240 hour(s))  Urine culture     Status: None   Collection Time: 12/01/13  8:39 PM  Result Value Ref Range Status   Specimen Description URINE, CLEAN CATCH  Final   Special Requests NONE  Final   Culture  Setup Time   Final    12/01/2013 23:11 Performed at [place]   Final    50,000 COLONIES/ML Performed at [facility]    Culture   Final    Multiple bacterial morphotypes present, none predominant. Suggest appropriate recollection if clinically indicated. Performed at [facility]    Report Status 12/03/2013 FINAL  Final  Blood culture (routine x 2)     Status: None (Preliminary result)   Collection Time:  12/02/13 12:30 AM  Result Value Ref Range Status   Specimen Description BLOOD LEFT ANTECUBITAL  Final   Special Requests BOTTLES DRAWN AEROBIC AND ANAEROBIC 5CC EA  Final   Culture  Setup Time   Final    12/02/2013 04:45 Performed at [facility]    Culture   Final           BLOOD CULTURE RECEIVED NO GROWTH TO DATE CULTURE WILL BE HELD FOR 5 DAYS BEFORE ISSUING A FINAL NEGATIVE REPORT Performed at [facility]    Report Status PENDING  Incomplete  Blood culture (routine x 2)     Status: None (Preliminary result)   Collection Time: 12/02/13  1:13 AM  Result Value Ref Range Status   Specimen Description BLOOD LEFT HAND  Final   Special Requests BOTTLES DRAWN AEROBIC AND ANAEROBIC 5CC EA  Final   Culture  Setup Time   Final    12/02/2013 10:36 Performed at [facility]    Culture   Final           BLOOD CULTURE RECEIVED NO GROWTH TO DATE CULTURE WILL BE HELD FOR 5 DAYS BEFORE ISSUING A FINAL NEGATIVE REPORT Performed at [facility]    Report Status PENDING  Incomplete  Urine culture     Status: None   Collection Time: 12/03/13  9:21 AM  Result Value Ref Range Status   Specimen Description URINE, CLEAN CATCH  Final   Special Requests none  Final   Culture  Setup Time   Final    12/03/2013 18:23 Performed at [place]   Final    25,000 COLONIES/ML Performed at [facility]    Culture   Final    Multiple bacterial morphotypes present, none predominant. Suggest appropriate recollection if clinically indicated. Performed at [facility]    Report Status 12/05/2013 FINAL  Final  Culture, blood (routine x 2)     Status: None (Preliminary result)   Collection Time: 12/04/13  9:30 AM  Result Value Ref Range Status   Specimen Description BLOOD RIGHT HAND  Final   Special Requests BOTTLES DRAWN AEROBIC ONLY [facility]  Final   Culture  Setup Time   Final    12/04/2013 14:09 Performed at [facility]     Culture   Final           BLOOD  CULTURE RECEIVED NO GROWTH TO DATE CULTURE WILL BE HELD FOR 5 DAYS BEFORE ISSUING A FINAL NEGATIVE REPORT Performed at [facility]    Report Status PENDING  Incomplete  Culture, blood (routine x 2)     Status: None (Preliminary result)   Collection Time: 12/04/13  9:50 AM  Result Value Ref Range Status   Specimen Description BLOOD LEFT HAND  Final   Special Requests BOTTLES DRAWN AEROBIC ONLY 3CC  Final   Culture  Setup Time   Final    12/04/2013 14:05 Performed at [facility]    Culture   Final           BLOOD CULTURE RECEIVED NO GROWTH TO DATE CULTURE WILL BE HELD FOR 5 DAYS BEFORE ISSUING A FINAL NEGATIVE REPORT Performed at [facility]    Report Status PENDING  Incomplete   CK 12/07/2013: 683  Assessment: His fever and back pain are improving on empiric antibiotics for possible focal bacterial myositis.  He may have an acute flare of gout.  Plan: 1. Continue current antibiotics for at least 3 weeks  Michel Bickers, MD [facility] [place] [phone] pager   [phone] cell 12/07/2013, 2:39 PM

## 2013-12-07 NOTE — Plan of Care (Signed)
Problem: Phase I Progression Outcomes Goal: OOB as tolerated unless otherwise ordered Outcome: Completed/Met Date Met:  12/07/13     

## 2013-12-08 DIAGNOSIS — M1000 Idiopathic gout, unspecified site: Secondary | ICD-10-CM | POA: Insufficient documentation

## 2013-12-08 DIAGNOSIS — M109 Gout, unspecified: Secondary | ICD-10-CM

## 2013-12-08 LAB — BASIC METABOLIC PANEL
Anion gap: 13 (ref 5–15)
BUN: 39 mg/dL — ABNORMAL HIGH (ref 6–23)
CO2: 23 mEq/L (ref 19–32)
Calcium: 8.9 mg/dL (ref 8.4–10.5)
Chloride: 105 mEq/L (ref 96–112)
Creatinine, Ser: 1.33 mg/dL (ref 0.50–1.35)
GFR calc Af Amer: 59 mL/min — ABNORMAL LOW (ref >90)
GFR calc non Af Amer: 51 mL/min — ABNORMAL LOW (ref >90)
Glucose, Bld: 102 mg/dL — ABNORMAL HIGH (ref 70–99)
Potassium: 4.2 mEq/L (ref 3.7–5.3)
Sodium: 141 mEq/L (ref 137–147)

## 2013-12-08 LAB — CBC
HCT: 32 % — ABNORMAL LOW (ref 39.0–52.0)
Hemoglobin: 10.3 g/dL — ABNORMAL LOW (ref 13.0–17.0)
MCH: 29.3 pg (ref 26.0–34.0)
MCHC: 32.2 g/dL (ref 30.0–36.0)
MCV: 91.2 fL (ref 78.0–100.0)
Platelets: 272 10*3/uL (ref 150–400)
RBC: 3.51 MIL/uL — ABNORMAL LOW (ref 4.22–5.81)
RDW: 14 % (ref 11.5–15.5)
WBC: 7 10*3/uL (ref 4.0–10.5)

## 2013-12-08 LAB — CULTURE, BLOOD (ROUTINE X 2)
Culture: NO GROWTH
Culture: NO GROWTH

## 2013-12-08 MED ORDER — POLYETHYLENE GLYCOL 3350 17 G PO PACK
17.0000 g | PACK | Freq: Every day | ORAL | Status: DC
  Administered 2013-12-08: 17 g via ORAL
  Filled 2013-12-08 (×2): qty 1

## 2013-12-08 MED ORDER — FUROSEMIDE 40 MG PO TABS
40.0000 mg | ORAL_TABLET | Freq: Every day | ORAL | Status: DC
  Administered 2013-12-08 – 2013-12-09 (×2): 40 mg via ORAL
  Filled 2013-12-08 (×2): qty 1

## 2013-12-08 MED ORDER — VANCOMYCIN HCL IN DEXTROSE 750-5 MG/150ML-% IV SOLN
750.0000 mg | Freq: Two times a day (BID) | INTRAVENOUS | Status: DC
  Administered 2013-12-08 – 2013-12-09 (×3): 750 mg via INTRAVENOUS
  Filled 2013-12-08 (×4): qty 150

## 2013-12-08 NOTE — Progress Notes (Signed)
Physical Therapy Treatment Patient Details Name: KAYCE SHANGRAW MRN: [id number] DOB: 07-Aug-1940 Today's Date: 12/08/2013    History of Present Illness Patient is a 73 y/o male admitted with fever, pain and syncope.  PMH includes HTN, CAD, CABG, LBBB, and recent LLE DVT treated with Eliquis.    PT Comments    Patient reports that he has not been out of his room in 5 days and was agreeable to walking with therapy today.   He says that he is feeling much better and what is really bothering him now is the pain in his left hand and right leg from gout.  Pt declined opportunity to attempt ascent/descent of stairs today.  Will continue to follow to improve functional mobility and maximize independence.      Follow Up Recommendations  Home health PT;Supervision/Assistance - 24 hour     Equipment Recommendations  Rolling walker with 5" wheels    Recommendations for Other Services       Precautions / Restrictions Precautions Precautions: Fall Restrictions Weight Bearing Restrictions: No    Mobility  Bed Mobility Overal bed mobility: Needs Assistance Bed Mobility: Rolling;Sidelying to Sit Rolling: Min assist Sidelying to sit: Min assist       General bed mobility comments: Min assist to finish roll to side and to help elevate trunk to seated position; verbal cues for sequencing and hand placement  Transfers Overall transfer level: Needs assistance Equipment used: Rolling walker (2 wheeled) Transfers: Sit to/from Stand Sit to Stand: Min guard         General transfer comment: Min guard for patient safety and balance; verbal cues for hand placement, use of RW and sequencing during sit-to-stand and stand-to-sit.  Verbal cues also to control descent to seated position.  Ambulation/Gait Ambulation/Gait assistance: Min guard Ambulation Distance (Feet): 122 Feet (67ft then 7ft with seated rest break in between) Assistive device: Rolling walker (2 wheeled) Gait  Pattern/deviations: Step-to pattern;Decreased stride length;Shuffle;Antalgic;Narrow base of support (Forward head; Neck flexed; Right toe-out)   Gait velocity interpretation: Below normal speed for age/gender General Gait Details: Gait is still very slow.  Pt is able to increase stride length and speed, use step-through pattern, decrease toe-out on right, and correct posture with frequent verbal cues from therapist.  Pt required one seated rest break after walking 31ft, complaining of pain and minor SOB.   Stairs            Wheelchair Mobility    Modified Rankin (Stroke Patients Only)       Balance Overall balance assessment: Needs assistance Sitting-balance support: Feet supported;No upper extremity supported Sitting balance-[name] Scale: Good     Standing balance support: Bilateral upper extremity supported;During functional activity Standing balance-[name] Scale: Poor                      Cognition Arousal/Alertness: Awake/alert Behavior During Therapy: WFL for tasks assessed/performed Overall Cognitive Status: Within Functional Limits for tasks assessed                      Exercises      General Comments        Pertinent Vitals/Pain Pain Assessment: 0-10 Pain Score:  (no number given; left scapular pain has been alleviated) Pain Location: left hand and right foot (gout) Pain Descriptors / Indicators: Constant Pain Intervention(s): Monitored during session;Limited activity within patient's tolerance;Repositioned  HR: 81 after walking 21ft SpO2: 98% (RA)    Home Living  Prior Function            PT Goals (current goals can now be found in the care plan section) Progress towards PT goals: Progressing toward goals    Frequency  Min 3X/week    PT Plan Current plan remains appropriate    Co-evaluation             End of Session Equipment Utilized During Treatment: Gait belt Activity Tolerance:  Patient limited by pain Patient left: in chair;with family/visitor present;with call bell/phone within reach     Time: 1028-1059 PT Time Calculation (min) (ACUTE ONLY): 31 min  Charges:  $Gait Training: 23-37 mins                    G Codes:      Sharnell Knight SPT 12/08/2013, 1:31 PM

## 2013-12-08 NOTE — Plan of Care (Signed)
Problem: Phase III Progression Outcomes Goal: Pain controlled on oral analgesia Outcome: Completed/Met Date Met:  12/08/13 Goal: Foley discontinued Outcome: Completed/Met Date Met:  12/08/13

## 2013-12-08 NOTE — Progress Notes (Signed)
[facility] PROGRESS NOTE Assessment/Plan: Syncope: - Most likely pre-renal and due to infectious etiology Cr is now improving. - Cont to hold lasix. Restart lasix. - V/q scan low probability. No events on telemetry. - Echo as below.  New onset Acute 1st toe and left 3rd phalange pain ? Due to gout: - has remained afebrile since antibiotics started. - Exquisitely tender to palpation and warm to touch. - start colchicine orally, try to avoid steroids.  Fever ? Due to myositis/ostemyelitis/Back pain: - Empiric antibiotics started on 11.27.2015, Consulted ID assistance. - BCx x2, urine CX are negative.- MRI of lumbar spine as below. - Insert picc line 11.28.2015. - cont antibiotics treatment for 3 weeks.  Left leg DVT: - cont eliquis. - GFR. 30.  AKI/Chronic Renal insufficiency: - Resolved with IV fluids, d/c IV fluids. - chronic, stable, and patient has appointment with nephrologist early next month  OSA on CPAP - cont cpap.   Code Status: full Family Communication: none  Disposition Plan: inpatient   Consultants:  none  Procedures: - V/q scan low probability - Echo 11.24.2015: Features are consistent with a pseudonormal left ventricular filling pattern, with concomitant abnormal relaxation and increased filling pressure (grade 2 diastolic dysfunction). - Pulmonary arteries: Systolic pressure was mildly increased. PA peak pressure: 34 mm Hg. - MRI lumbar spine: Widespread abnormal soft tissue inflammation in the posterior paraspinal soft tissues, primarily the right, surrounding chronically degenerated lumbar facets. Small volume of more free appearing fluid tracking along the right iliacus muscle. No marrow edema to suggest a septic facet joint at this time, however, recommend correlation with blood cultures as the appearance is suspicious for paraspinal cellulitis/myositis in this setting.  Widespread degenerative lumbar spinal stenosis ranging from mild  (L2-L3) to severe (L4-L5 and L5-S1).  If the patient's fever source remains occult, recommend also repeat MRI (without and with contrast preferred) of the cervicothoracic junction where similar soft tissue inflammation and nonspecific posterior epidural signal was demonstrated on 12/01/2013.  Antibiotics:  None  HPI/Subjective: Back pain resolved. Podagra resolved.  Objective: Filed Vitals:   12/07/13 2008 12/07/13 2018 12/07/13 2029 12/08/13 0555  BP: 161/74 120/90  151/75  Pulse: 79 92 75 70  Temp: 98.1 F (36.7 C) 98 F (36.7 C)  97.4 F (36.3 C)  TempSrc: Oral   Oral  Resp: 20 20 16 18   Height:      Weight:      SpO2: 98% 99%  97%    Intake/Output Summary (Last 24 hours) at 12/08/13 0827 Last data filed at 12/07/13 2254  Gross per 24 hour  Intake    610 ml  Output      0 ml  Net    610 ml   Filed Weights   12/02/13 0138  Weight: 93.2 kg (205 lb 7.5 oz)    Exam:  General: Alert, awake, oriented x3, in no acute distress.  HEENT: No bruits, no goiter.  Heart: Regular rate and rhythm. Lungs: Good air movement, clear Abdomen: Soft, nontender, nondistended, positive bowel sounds.   Data Reviewed: Basic Metabolic Panel:  Recent Labs Lab 12/03/13 0850 12/04/13 0732 12/05/13 0610 12/06/13 0235 12/08/13 0437  NA 136* 138 137 136* 141  K 3.8 4.0 4.5 4.2 4.2  CL 97 99 97 98 105  CO2 24 25 24 23 23   GLUCOSE 184* 126* 116* 115* 102*  BUN 44* 46* 46* 49* 39*  CREATININE 2.13* 1.97* 1.78* 1.70* 1.33  CALCIUM 9.3 9.4 9.6 9.3 8.9  Liver Function Tests: No results for input(s): AST, ALT, ALKPHOS, BILITOT, PROT, ALBUMIN in the last 168 hours. No results for input(s): LIPASE, AMYLASE in the last 168 hours. No results for input(s): AMMONIA in the last 168 hours. CBC:  Recent Labs Lab 12/01/13 1340 12/01/13 1408 12/02/13 0113 12/04/13 0820 12/07/13 0435 12/08/13 0437  WBC 11.3*  --  11.0* 10.4 7.0 7.0  NEUTROABS 9.3*  --   --   --   --   --   HGB 13.8  15.3 11.7* 12.7* 10.4* 10.3*  HCT 42.0 45.0 35.4* 38.5* 32.0* 32.0*  MCV 92.7  --  91.9 92.8 90.7 91.2  PLT 156  --  146* 196 248 272   Cardiac Enzymes:  Recent Labs Lab 12/02/13 0030 12/02/13 0113 12/02/13 0649 12/02/13 1350 12/07/13 0435  CKTOTAL  --   --   --   --  95*  TROPONINI <0.30 <0.30 <0.30 <0.30  --    BNP (last 3 results) No results for input(s): PROBNP in the last 8760 hours. CBG:  Recent Labs Lab 12/02/13 0023  GLUCAP 152*    Recent Results (from the past 240 hour(s))  Urine culture     Status: None   Collection Time: 12/01/13  8:39 PM  Result Value Ref Range Status   Specimen Description URINE, CLEAN CATCH  Final   Special Requests NONE  Final   Culture  Setup Time   Final    12/01/2013 23:11 Performed at [place]   Final    50,000 COLONIES/ML Performed at [facility]    Culture   Final    Multiple bacterial morphotypes present, none predominant. Suggest appropriate recollection if clinically indicated. Performed at [facility]    Report Status 12/03/2013 FINAL  Final  Blood culture (routine x 2)     Status: None (Preliminary result)   Collection Time: 12/02/13 12:30 AM  Result Value Ref Range Status   Specimen Description BLOOD LEFT ANTECUBITAL  Final   Special Requests BOTTLES DRAWN AEROBIC AND ANAEROBIC 5CC EA  Final   Culture  Setup Time   Final    12/02/2013 04:45 Performed at [facility]    Culture   Final           BLOOD CULTURE RECEIVED NO GROWTH TO DATE CULTURE WILL BE HELD FOR 5 DAYS BEFORE ISSUING A FINAL NEGATIVE REPORT Performed at [facility]    Report Status PENDING  Incomplete  Blood culture (routine x 2)     Status: None (Preliminary result)   Collection Time: 12/02/13  1:13 AM  Result Value Ref Range Status   Specimen Description BLOOD LEFT HAND  Final   Special Requests BOTTLES DRAWN AEROBIC AND ANAEROBIC 5CC EA  Final   Culture  Setup Time   Final     12/02/2013 10:36 Performed at [facility]    Culture   Final           BLOOD CULTURE RECEIVED NO GROWTH TO DATE CULTURE WILL BE HELD FOR 5 DAYS BEFORE ISSUING A FINAL NEGATIVE REPORT Performed at [facility]    Report Status PENDING  Incomplete  Urine culture     Status: None   Collection Time: 12/03/13  9:21 AM  Result Value Ref Range Status   Specimen Description URINE, CLEAN CATCH  Final   Special Requests none  Final   Culture  Setup Time   Final  12/03/2013 18:23 Performed at [facility]   Final    25,000 COLONIES/ML Performed at [facility]   Final    Multiple bacterial morphotypes present, none predominant. Suggest appropriate recollection if clinically indicated. Performed at [facility]    Report Status 12/05/2013 FINAL  Final  Culture, blood (routine x 2)     Status: None (Preliminary result)   Collection Time: 12/04/13  9:30 AM  Result Value Ref Range Status   Specimen Description BLOOD RIGHT HAND  Final   Special Requests BOTTLES DRAWN AEROBIC ONLY [facility]  Final   Culture  Setup Time   Final    12/04/2013 14:09 Performed at [facility]    Culture   Final           BLOOD CULTURE RECEIVED NO GROWTH TO DATE CULTURE WILL BE HELD FOR 5 DAYS BEFORE ISSUING A FINAL NEGATIVE REPORT Performed at [facility]    Report Status PENDING  Incomplete  Culture, blood (routine x 2)     Status: None (Preliminary result)   Collection Time: 12/04/13  9:50 AM  Result Value Ref Range Status   Specimen Description BLOOD LEFT HAND  Final   Special Requests BOTTLES DRAWN AEROBIC ONLY 3CC  Final   Culture  Setup Time   Final    12/04/2013 14:05 Performed at [facility]    Culture   Final           BLOOD CULTURE RECEIVED NO GROWTH TO DATE CULTURE WILL BE HELD FOR 5 DAYS BEFORE ISSUING A FINAL NEGATIVE REPORT Performed at [facility]    Report Status PENDING  Incomplete      Studies: No results found.  Scheduled Meds: . apixaban  5 mg Oral BID  . atorvastatin  10 mg Oral Daily  . cefTRIAXone (ROCEPHIN)  IV  1 g Intravenous Q24H  . famotidine  20 mg Oral QHS  . furosemide  40 mg Oral Daily  . HYDROcodone-acetaminophen  2 tablet Oral Q4H  . metoprolol  25 mg Oral BID  . polyethylene glycol  17 g Oral Daily  . sodium chloride  3 mL Intravenous Q12H  . tamsulosin  0.4 mg Oral Daily  . vancomycin  1,250 mg Intravenous Q24H   Continuous Infusions:     Charlynne Cousins  [facility] Pager [phone]. If 8PM-8AM, please contact night-coverage at [email or web address], password [facility] 12/08/2013, 8:27 AM  LOS: 7 days

## 2013-12-08 NOTE — Progress Notes (Signed)
Patient ID: Paul Price, male   DOB: 1940/11/06, 73 y.o.   MRN: [id number]         [facility] for Infectious Disease    Date of Admission:  12/01/2013           Day 4 vancomycin        Day 4 ceftriaxone  Principal Problem:   Fever Active Problems:   CAD - CABG '92, LAD DES 4/12, low risk Myoview 6/13   HTN (hypertension)   Hyperlipidemia with target LDL less than 70   OSA on CPAP   Renal insufficiency   Syncope   Left leg DVT   Focal myositis   . apixaban  5 mg Oral BID  . atorvastatin  10 mg Oral Daily  . cefTRIAXone (ROCEPHIN)  IV  1 g Intravenous Q24H  . famotidine  20 mg Oral QHS  . furosemide  40 mg Oral Daily  . HYDROcodone-acetaminophen  2 tablet Oral Q4H  . metoprolol  25 mg Oral BID  . polyethylene glycol  17 g Oral Daily  . sodium chloride  3 mL Intravenous Q12H  . tamsulosin  0.4 mg Oral Daily  . vancomycin  750 mg Intravenous Q12H    Subjective: Still has acute  pain in his right great toe and left long finger c/w  gout.  Review of Systems: Pertinent items are noted in HPI.  Past Medical History  Diagnosis Date  . Hypertension   . Coronary artery disease   . History of percutaneous left heart catheterization 04/2010  . Obesity   . OSA on CPAP   . Hx of echocardiogram 05/2009    EF 40-45%, he did have mild annular calcification with mild-to-moderate MR and mild TR as well as aortic valve sclerosis. Estimated RV systolic pressure was 21 mm.  . History of stress test 06/2011    No significant ischemia, this is a low risk scan. Clinical correlation recommended Abnormal myocardial perfusion study.  Marland Kitchen RBBB   . BPH (benign prostatic hyperplasia)     History  Substance Use Topics  . Smoking status: Former Research scientist (life sciences)  . Smokeless tobacco: Never Used     Comment: quit ~ 34 years ago  . Alcohol Use: No    Family History  Problem Relation Age of Onset  . Heart disease Father    Allergies  Allergen Reactions  . Nitrostat [Nitroglycerin]    Causes blood pressure to "bottom out"    OBJECTIVE: Blood pressure 151/75, pulse 81, temperature 97.4 F (36.3 C), temperature source Oral, resp. rate 18, height 5\' 3"  (1.6 m), weight 205 lb 7.5 oz (93.2 kg), SpO2 98 %. General: a xo by 3 in nad Skin: no acute rash Lungs: clear Cor: regular S1 and S2 with no murmurs Abdomen: soft and nontender Back: No pain with palpation of the right paraspinous muscles. No swelling, redness or asymmetry Joints and extremities: erythem and swelling of his left 3rd finger adn wrist and right great toe adn right knee  Lab Results Lab Results  Component Value Date   WBC 7.0 12/08/2013   HGB 10.3* 12/08/2013   HCT 32.0* 12/08/2013   MCV 91.2 12/08/2013   PLT 272 12/08/2013    Lab Results  Component Value Date   CREATININE 1.33 12/08/2013   BUN 39* 12/08/2013   NA 141 12/08/2013   K 4.2 12/08/2013   CL 105 12/08/2013   CO2 23 12/08/2013    Lab Results  Component Value Date   ALT  24 07/14/2013   AST 27 07/14/2013   ALKPHOS 59 07/14/2013   BILITOT 0.4 07/14/2013     Microbiology: Recent Results (from the past 240 hour(s))  Urine culture     Status: None   Collection Time: 12/01/13  8:39 PM  Result Value Ref Range Status   Specimen Description URINE, CLEAN CATCH  Final   Special Requests NONE  Final   Culture  Setup Time   Final    12/01/2013 23:11 Performed at [place]   Final    50,000 COLONIES/ML Performed at [facility]    Culture   Final    Multiple bacterial morphotypes present, none predominant. Suggest appropriate recollection if clinically indicated. Performed at [facility]    Report Status 12/03/2013 FINAL  Final  Blood culture (routine x 2)     Status: None   Collection Time: 12/02/13 12:30 AM  Result Value Ref Range Status   Specimen Description BLOOD LEFT ANTECUBITAL  Final   Special Requests BOTTLES DRAWN AEROBIC AND ANAEROBIC 5CC EA  Final   Culture  Setup Time    Final    12/02/2013 04:45 Performed at [place]   Final    NO GROWTH 5 DAYS Performed at [facility]    Report Status 12/08/2013 FINAL  Final  Blood culture (routine x 2)     Status: None   Collection Time: 12/02/13  1:13 AM  Result Value Ref Range Status   Specimen Description BLOOD LEFT HAND  Final   Special Requests BOTTLES DRAWN AEROBIC AND ANAEROBIC 5CC EA  Final   Culture  Setup Time   Final    12/02/2013 10:36 Performed at [place]   Final    NO GROWTH 5 DAYS Performed at [facility]    Report Status 12/08/2013 FINAL  Final  Urine culture     Status: None   Collection Time: 12/03/13  9:21 AM  Result Value Ref Range Status   Specimen Description URINE, CLEAN CATCH  Final   Special Requests none  Final   Culture  Setup Time   Final    12/03/2013 18:23 Performed at [place]   Final    25,000 COLONIES/ML Performed at [facility]    Culture   Final    Multiple bacterial morphotypes present, none predominant. Suggest appropriate recollection if clinically indicated. Performed at [facility]    Report Status 12/05/2013 FINAL  Final  Culture, blood (routine x 2)     Status: None (Preliminary result)   Collection Time: 12/04/13  9:30 AM  Result Value Ref Range Status   Specimen Description BLOOD RIGHT HAND  Final   Special Requests BOTTLES DRAWN AEROBIC ONLY [facility]  Final   Culture  Setup Time   Final    12/04/2013 14:09 Performed at [facility]    Culture   Final           BLOOD CULTURE RECEIVED NO GROWTH TO DATE CULTURE WILL BE HELD FOR 5 DAYS BEFORE ISSUING A FINAL NEGATIVE REPORT Performed at [facility]    Report Status PENDING  Incomplete  Culture, blood (routine x 2)     Status: None (Preliminary result)   Collection Time: 12/04/13  9:50 AM  Result Value Ref Range Status   Specimen Description BLOOD LEFT HAND  Final   Special  Requests BOTTLES DRAWN AEROBIC ONLY 3CC  Final   Culture  Setup Time   Final    12/04/2013 14:05 Performed at [facility]    Culture   Final           BLOOD CULTURE RECEIVED NO GROWTH TO DATE CULTURE WILL BE HELD FOR 5 DAYS BEFORE ISSUING A FINAL NEGATIVE REPORT Performed at [facility]    Report Status PENDING  Incomplete   CK 12/07/2013: 683  Assessment: His fever and back pain are improving on empiric antibiotics for presumably from bacterial myositis. Improved with vancomycin and ceftriaxone. Course complicated by acute flare of gout.  Plan: 1. Will recommend that he continue current antibiotics of vancomycin and ceftriaxone for at least 3 weeks  2. vanco trough 15-20. Will need weekly bmp, cbc and vanco trough 3. Gout flare to multiple joints = consider quick steroid burst/taper to help pain management and immobility if current regimen is not working 4. Will have patient follow up in 2-3 wk with Dr. Megan Salon or myself   will sign off  Carlyle Basques, [place] for [place] [phone] pager   [phone] cell 12/08/2013, 12:23 PM

## 2013-12-08 NOTE — Progress Notes (Signed)
Medicare Important Message given? YES  (If response is "NO", the following Medicare IM given date fields will be blank)  Date Medicare IM given: 12/08/13 Medicare IM given by:  Dahlia Client [employer]

## 2013-12-08 NOTE — Plan of Care (Signed)
Problem: Phase III Progression Outcomes Goal: Pain controlled on oral analgesia Outcome: Progressing     

## 2013-12-08 NOTE — Plan of Care (Signed)
Problem: Phase II Progression Outcomes Goal: Progress activity as tolerated unless otherwise ordered Outcome: Progressing     

## 2013-12-08 NOTE — Clinical Social Work Psychosocial (Signed)
Clinical Social Work Department BRIEF PSYCHOSOCIAL ASSESSMENT 12/08/2013  Patient:  Paul Price, Paul Price     Account Number:  [id number]     Admit date:  12/01/2013  Clinical Social Worker:  Dian Queen  Date/Time:  12/08/2013 04:38 PM  Referred by:  Physician  Date Referred:  12/08/2013 Referred for  SNF Placement   Other Referral:   Interview type:  Patient Other interview type:   wife    PSYCHOSOCIAL DATA Living Status:  WIFE Admitted from facility:   Level of care:   Primary support name:   Primary support relationship to patient:  SPOUSE Degree of support available:   Patient's wife lives with him and is supportive of him but can not provide care until his strength has improved.    CURRENT CONCERNS Current Concerns  Post-Acute Placement   Other Concerns:    SOCIAL WORK ASSESSMENT / PLAN Patient is a 73 year male who lives with his wife.  Patient is oriented x4, wife is at bedside.  Patient was sleepy but alert and talkative.  Patient stated he is in agreement to going to SNF for rehab to get his strength up.  Patient's wife is also in agreement and would like patient to go to SNF in [facility].  Patient live in [place] and would prefer [place] if possible.   Assessment/plan status:   Other assessment/ plan:   Information/referral to community resources:    PATIENT'S/FAMILY'S RESPONSE TO PLAN OF CARE: Patient and wife in agreement to going to SNF once he is medically ready and discharge orders have been given.   [name]. [place], MSW, [place] 12/08/2013 4:58 PM

## 2013-12-08 NOTE — Clinical Social Work Placement (Addendum)
Clinical Social Work Department CLINICAL SOCIAL WORK PLACEMENT NOTE 12/08/2013  Patient:  Paul Price, Paul Price  Account Number:  [id number] Admit date:  12/01/2013  Clinical Social Worker:  Merikay Lesniewski, LCSWA  Date/time:  12/08/2013 04:58 PM  Clinical Social Work is seeking post-discharge placement for this patient at the following level of care:   [place]   (*CSW will update this form in Epic as items are completed)   12/08/2013  Patient/family provided with [place] Department of Clinical Social Work's list of facilities offering this level of care within the geographic area requested by the patient (or if unable, by the patient's family).  12/08/2013  Patient/family informed of their freedom to choose among providers that offer the needed level of care, that participate in Medicare, Medicaid or managed care program needed by the patient, have an available bed and are willing to accept the patient.  12/08/2013  Patient/family informed of [facility]' ownership interest in [facility], as well as of the fact that they are under no obligation to receive care at this facility.  PASARR submitted to EDS on 12/08/2013 PASARR number received on 12/08/2013  FL2 transmitted to all facilities in geographic area requested by pt/family on  12/08/2013 FL2 transmitted to all facilities within larger geographic area on 12/08/2013  Patient informed that his/her managed care company has contracts with or will negotiate with  certain facilities, including the following:     Patient/family informed of bed offers received: 12/09/13 Patient chooses bed at [facility]  Physician recommends and patient chooses bed at    Patient to be transferred to [place] on 12/09/13  Patient to be transferred to facility by [facility]  Patient and family notified of transfer on 12/09/13 Name of family member notified: [place] Hay his wife    The following physician request were entered  in Epic:   Additional Comments: Please sign FL2 on chart.  [name]. [place], MSW, [place] 12/09/2013 4:30 PM

## 2013-12-08 NOTE — Progress Notes (Addendum)
[place] for Vancomycin / Rocephin Indication: Fever, back pain, focal bacterial myositis  Allergies  Allergen Reactions  . Nitrostat [Nitroglycerin]     Causes blood pressure to "bottom out"    Labs:  Recent Labs  12/06/13 0235 12/07/13 0435 12/08/13 0437  WBC  --  7.0 7.0  HGB  --  10.4* 10.3*  PLT  --  248 272  CREATININE 1.70*  --  1.33   Estimated Creatinine Clearance: 48.5 mL/min (by C-G formula based on Cr of 1.33). No results for input(s): VANCOTROUGH, VANCOPEAK, VANCORANDOM, GENTTROUGH, GENTPEAK, GENTRANDOM, TOBRATROUGH, TOBRAPEAK, TOBRARND, AMIKACINPEAK, AMIKACINTROU, AMIKACIN in the last 72 hours.   Microbiology: Recent Results (from the past 720 hour(s))  Urine culture     Status: None   Collection Time: 12/01/13  8:39 PM  Result Value Ref Range Status   Specimen Description URINE, CLEAN CATCH  Final   Special Requests NONE  Final   Culture  Setup Time   Final    12/01/2013 23:11 Performed at [place]   Final    50,000 COLONIES/ML Performed at [facility]    Culture   Final    Multiple bacterial morphotypes present, none predominant. Suggest appropriate recollection if clinically indicated. Performed at [facility]    Report Status 12/03/2013 FINAL  Final  Blood culture (routine x 2)     Status: None (Preliminary result)   Collection Time: 12/02/13 12:30 AM  Result Value Ref Range Status   Specimen Description BLOOD LEFT ANTECUBITAL  Final   Special Requests BOTTLES DRAWN AEROBIC AND ANAEROBIC 5CC EA  Final   Culture  Setup Time   Final    12/02/2013 04:45 Performed at [facility]    Culture   Final           BLOOD CULTURE RECEIVED NO GROWTH TO DATE CULTURE WILL BE HELD FOR 5 DAYS BEFORE ISSUING A FINAL NEGATIVE REPORT Performed at [facility]    Report Status PENDING  Incomplete  Blood culture (routine x 2)     Status: None (Preliminary result)   Collection Time: 12/02/13  1:13 AM  Result Value Ref Range Status   Specimen Description BLOOD LEFT HAND  Final   Special Requests BOTTLES DRAWN AEROBIC AND ANAEROBIC 5CC EA  Final   Culture  Setup Time   Final    12/02/2013 10:36 Performed at [facility]    Culture   Final           BLOOD CULTURE RECEIVED NO GROWTH TO DATE CULTURE WILL BE HELD FOR 5 DAYS BEFORE ISSUING A FINAL NEGATIVE REPORT Performed at [facility]    Report Status PENDING  Incomplete  Urine culture     Status: None   Collection Time: 12/03/13  9:21 AM  Result Value Ref Range Status   Specimen Description URINE, CLEAN CATCH  Final   Special Requests none  Final   Culture  Setup Time   Final    12/03/2013 18:23 Performed at [place]   Final    25,000 COLONIES/ML Performed at [facility]    Culture   Final    Multiple bacterial morphotypes present, none predominant. Suggest appropriate recollection if clinically indicated. Performed at [facility]    Report Status 12/05/2013 FINAL  Final  Culture, blood (routine x 2)     Status: None (Preliminary result)  Collection Time: 12/04/13  9:30 AM  Result Value Ref Range Status   Specimen Description BLOOD RIGHT HAND  Final   Special Requests BOTTLES DRAWN AEROBIC ONLY [facility]  Final   Culture  Setup Time   Final    12/04/2013 14:09 Performed at [facility]    Culture   Final           BLOOD CULTURE RECEIVED NO GROWTH TO DATE CULTURE WILL BE HELD FOR 5 DAYS BEFORE ISSUING A FINAL NEGATIVE REPORT Performed at [facility]    Report Status PENDING  Incomplete  Culture, blood (routine x 2)     Status: None (Preliminary result)   Collection Time: 12/04/13  9:50 AM  Result Value Ref Range Status   Specimen Description BLOOD LEFT HAND  Final   Special Requests BOTTLES DRAWN AEROBIC ONLY 3CC  Final   Culture  Setup Time   Final    12/04/2013 14:05 Performed at [facility]     Culture   Final           BLOOD CULTURE RECEIVED NO GROWTH TO DATE CULTURE WILL BE HELD FOR 5 DAYS BEFORE ISSUING A FINAL NEGATIVE REPORT Performed at [facility]    Report Status PENDING  Incomplete    Assessment: 73yom on Day 4 of Vancomycin and Ceftriaxone for possible bacterial myositis Scr now improved to 1.3 Afebrile, WBC = 7 3 weeks of antibiotics planned  Goal of Therapy:  Vancomycin trough level 15-20 mcg/ml  Plan:  Increase vancomycin to 750 mg iv Q 12 hours Continue Rocephin at current dose Vancomycin trough planned for Wednesday morning  Thank you. Anette Guarneri, PharmD [phone]  12/08/2013,10:05 AM

## 2013-12-09 DIAGNOSIS — I82409 Acute embolism and thrombosis of unspecified deep veins of unspecified lower extremity: Secondary | ICD-10-CM

## 2013-12-09 DIAGNOSIS — I10 Essential (primary) hypertension: Secondary | ICD-10-CM

## 2013-12-09 DIAGNOSIS — M1000 Idiopathic gout, unspecified site: Secondary | ICD-10-CM

## 2013-12-09 DIAGNOSIS — M609 Myositis, unspecified: Secondary | ICD-10-CM

## 2013-12-09 HISTORY — DX: Acute embolism and thrombosis of unspecified deep veins of unspecified lower extremity: I82.409

## 2013-12-09 HISTORY — DX: Myositis, unspecified: M60.9

## 2013-12-09 LAB — BASIC METABOLIC PANEL
Anion gap: 13 (ref 5–15)
BUN: 36 mg/dL — ABNORMAL HIGH (ref 6–23)
CO2: 24 mEq/L (ref 19–32)
Calcium: 9.2 mg/dL (ref 8.4–10.5)
Chloride: 103 mEq/L (ref 96–112)
Creatinine, Ser: 1.25 mg/dL (ref 0.50–1.35)
GFR calc Af Amer: 63 mL/min — ABNORMAL LOW (ref >90)
GFR calc non Af Amer: 55 mL/min — ABNORMAL LOW (ref >90)
Glucose, Bld: 90 mg/dL (ref 70–99)
Potassium: 4 mEq/L (ref 3.7–5.3)
Sodium: 140 mEq/L (ref 137–147)

## 2013-12-09 MED ORDER — PREDNISONE 20 MG PO TABS: 40.0000 mg | ORAL_TABLET | Freq: Every day | ORAL | Status: DC

## 2013-12-09 MED ORDER — HYDROCODONE-ACETAMINOPHEN 5-325 MG PO TABS: 2.0000 | ORAL_TABLET | ORAL | Status: DC | PRN

## 2013-12-09 MED ORDER — HEPARIN SOD (PORK) LOCK FLUSH 100 UNIT/ML IV SOLN
250.0000 [IU] | Freq: Every day | INTRAVENOUS | Status: DC
  Filled 2013-12-09: qty 3

## 2013-12-09 MED ORDER — POLYETHYLENE GLYCOL 3350 17 G PO PACK: 17.0000 g | PACK | Freq: Every day | ORAL | Status: DC

## 2013-12-09 MED ORDER — METHYLPREDNISOLONE SODIUM SUCC 40 MG IJ SOLR
40.0000 mg | Freq: Four times a day (QID) | INTRAMUSCULAR | Status: DC
  Administered 2013-12-09: 40 mg via INTRAVENOUS
  Filled 2013-12-09 (×4): qty 1

## 2013-12-09 MED ORDER — CEFTRIAXONE SODIUM 1 G IV SOLR: 1.0000 g | INTRAVENOUS | Status: AC

## 2013-12-09 MED ORDER — VANCOMYCIN HCL IN DEXTROSE 750-5 MG/150ML-% IV SOLN: 750.0000 mg | Freq: Two times a day (BID) | INTRAVENOUS | Status: DC

## 2013-12-09 MED ORDER — HEPARIN SOD (PORK) LOCK FLUSH 100 UNIT/ML IV SOLN
250.0000 [IU] | INTRAVENOUS | Status: DC | PRN
  Administered 2013-12-09: 250 [IU]
  Filled 2013-12-09: qty 3

## 2013-12-09 MED ORDER — APIXABAN 5 MG PO TABS: 5.0000 mg | ORAL_TABLET | Freq: Two times a day (BID) | ORAL | Status: DC

## 2013-12-09 MED ORDER — COLCHICINE 0.6 MG PO TABS: 0.6000 mg | ORAL_TABLET | Freq: Every day | ORAL | Status: DC

## 2013-12-09 NOTE — Progress Notes (Addendum)
Physical Therapy Treatment Patient Details Name: Paul Price MRN: [id number] DOB: 1940-08-28 Today's Date: 12/09/2013    History of Present Illness Patient is a 73 y/o male admitted with fever, pain and syncope.  PMH includes HTN, CAD, CABG, LBBB, and recent LLE DVT treated with Eliquis.    PT Comments    Pt with increased activity tolerance, gait and ability to perform stairs today. Pt continues to require cues and assist for transfers and gait. Pt min assist or less and if wife can perform assist at this level pt could return home (she is not present at this time) but if not will require ST-SNF. Will continue to follow to maximize function and activity. Pt with decreased pain and edema of left hand and right foot today allowing for increased mobility.  Follow Up Recommendations  SNF;Supervision/Assistance - 24 hour (per chart spouse reports she cannot assist pt until he is stronger)     Equipment Recommendations  Rolling walker with 5" wheels    Recommendations for Other Services       Precautions / Restrictions Precautions Precautions: Fall Restrictions Weight Bearing Restrictions: No    Mobility  Bed Mobility Overal bed mobility: Needs Assistance Bed Mobility: Rolling;Sidelying to Sit Rolling: Supervision Sidelying to sit: Supervision       General bed mobility comments: cues for sequence, ease of transfer with assist of rail to sit  Transfers Overall transfer level: Needs assistance   Transfers: Sit to/from Stand Sit to Stand: Min assist         General transfer comment: cues for hand placement and control of descent x 2 trials with increased plopping in chair with fatigue despite cues  Ambulation/Gait Ambulation/Gait assistance: Min guard Ambulation Distance (Feet): 200 Feet Assistive device: Rolling walker (2 wheeled) Gait Pattern/deviations: Step-to pattern;Shuffle;Narrow base of support   Gait velocity interpretation: Below normal speed for  age/gender General Gait Details: cues for increased stride, correcting toe out on right and for posture with cues to look up and step into RW   Stairs Stairs: Yes Stairs assistance: Min assist Stair Management: One rail Right;Two rails;Step to pattern;Forwards;Sideways Number of Stairs: 3 General stair comments: Pt stepped forward with bil rails with cues for sequence with difficulty controlling descent and attempted sidestepping but pt stated he preferred forward method  Wheelchair Mobility    Modified Rankin (Stroke Patients Only)       Balance Overall balance assessment: Needs assistance   Sitting balance-[name] Scale: Good       Standing balance-[name] Scale: Poor                      Cognition Arousal/Alertness: Awake/alert Behavior During Therapy: WFL for tasks assessed/performed Overall Cognitive Status: Within Functional Limits for tasks assessed                      Exercises General Exercises - Lower Extremity Long Arc Quad: AROM;Seated;Both;10 reps Hip Flexion/Marching: AROM;Seated;Both;10 reps    General Comments        Pertinent Vitals/Pain Pain Assessment: No/denies pain    Home Living                      Prior Function            PT Goals (current goals can now be found in the care plan section) Progress towards PT goals: Progressing toward goals    Frequency       PT Plan Discharge  plan needs to be updated    Co-evaluation             End of Session Equipment Utilized During Treatment: Gait belt Activity Tolerance: Patient tolerated treatment well Patient left: in chair;with call bell/phone within reach;with chair alarm set     Time: [id number] PT Time Calculation (min) (ACUTE ONLY): 34 min  Charges:  $Gait Training: 8-22 mins $Therapeutic Activity: 8-22 mins                    G Codes:      Melford Aase 12-31-13, 9:29 AM Elwyn Reach, [place]

## 2013-12-09 NOTE — Progress Notes (Signed)
Patient is refusing CPAP at this time. RT informed patient to notify RT if he changed his mind.

## 2013-12-09 NOTE — Progress Notes (Signed)
[facility] for Vancomycin / Rocephin Indication: Fever, back pain, focal bacterial myositis  Allergies  Allergen Reactions  . Nitrostat [Nitroglycerin]     Causes blood pressure to "bottom out"    Labs:  Recent Labs  12/07/13 0435 12/08/13 0437 12/09/13 0445  WBC 7.0 7.0  --   HGB 10.4* 10.3*  --   PLT 248 272  --   CREATININE  --  1.33 1.25   Estimated Creatinine Clearance: 51.6 mL/min (by C-G formula based on Cr of 1.25). No results for input(s): VANCOTROUGH, VANCOPEAK, VANCORANDOM, GENTTROUGH, GENTPEAK, GENTRANDOM, TOBRATROUGH, TOBRAPEAK, TOBRARND, AMIKACINPEAK, AMIKACINTROU, AMIKACIN in the last 72 hours.   Assessment: 73yom on Day 5 of Vancomycin and Ceftriaxone for possible bacterial myositis Scr now improved to 1.25 Afebrile, WBC = 7 3 weeks of antibiotics planned  Goal of Therapy:  Vancomycin trough level 15-20 mcg/ml  Plan:  Continue Vancomycin to 750 mg iv Q 12 hours Continue Rocephin at current dose BMET and vancomycin trough level due Friday if discharged to SNF  Thank you. Anette Guarneri, PharmD [phone]  12/09/2013,10:13 AM

## 2013-12-09 NOTE — Clinical Social Work Note (Signed)
Patient to be d/c'ed today to [facility] in [place].  Patient and family agreeable to plans will transport via ems RN to call report.  Patient's wife was at bedside and was glad to hear that patient will be going to [facility] because it is close to where they live.  Evette Cristal, MSW, [place]

## 2013-12-09 NOTE — Discharge Summary (Signed)
Physician Discharge Summary  EDMUND HOLCOMB XBM:[id number] DOB: April 25, 1940 DOA: 12/01/2013  PCP: Horatio Pel, MD  Admit date: 12/01/2013 Discharge date: 12/09/2013  Time spent: 35 minutes  Recommendations for Outpatient Follow-up:  1. Follow up with PCP in 3 weeks. 2. ID in 3 week remove PICC line. 3. Vanc per home health protocol.  Discharge Diagnoses:  Principal Problem:   Fever Active Problems:   CAD - CABG '92, LAD DES 4/12, low risk Myoview 6/13   HTN (hypertension)   Hyperlipidemia with target LDL less than 70   OSA on CPAP   Renal insufficiency   Syncope   Left leg DVT   Focal myositis   Primary gout   Discharge Condition: stable  Diet recommendation: heart healthy  Filed Weights   12/02/13 0138  Weight: 93.2 kg (205 lb 7.5 oz)    History of present illness:  73 y.o. male h/o CAD who presents to the ED with c/o L scapular pain. Symptoms onset suddenly 3 days ago, persistent and unrelenting. Cough makes pain worse, pain is much worse with movement, better at rest, he is currently being treated for LLE DVT with elliquis for past 1 week. Called PCP this morning and sent to ED for eval.  Hospital Course:  Syncope: - Unclear etiology. Most likely pre-renal and due to infectious etiology. - Held ACE-I and lasix on admission.  whichc can be resume as an outpatient. - V/q scan low probability. No events on telemetry. - Echo as below.  Complaining of back pain: - MRI of lumbar as below. - New neurological symptoms and cont to have fever. Start empiric antibiotics.  Fever ? Due to myositis - Repeat blood cultures negative. consult ID agreed with empiric antibiotic treatment. - BCx, urine CX are negative.- MRI of lumbar spine as below. - ESR > 100, started empiric vanc and rocephin. whichc he will cont for 3 weeks, last day 12.20.2015. - Insert picc line 11.28.2015  Left leg DVT: - cont eliquis. - GFR. 30.  AKI/Chronic Renal insufficiency: - most  likely pre-renal. - Cr improving with hydration. - chronic, stable, and patient has appointment with nephrologist early next month  OSA on CPAP - cont cpap.  Procedures:  MRI lumbar spine  MIR thorasic and cervical spine.  Echo 11.25.2015  Consultations:  ID  Discharge Exam: Filed Vitals:   12/09/13 1022  BP: 162/79  Pulse: 68  Temp:   Resp:     General: A&O x3 Cardiovascular: RRR Respiratory: good air movement CTA B/L  Discharge Instructions You were cared for by a hospitalist during your hospital stay. If you have any questions about your discharge medications or the care you received while you were in the hospital after you are discharged, you can call the unit and asked to speak with the hospitalist on call if the hospitalist that took care of you is not available. Once you are discharged, your primary care physician will handle any further medical issues. Please note that NO REFILLS for any discharge medications will be authorized once you are discharged, as it is imperative that you return to your primary care physician (or establish a relationship with a primary care physician if you do not have one) for your aftercare needs so that they can reassess your need for medications and monitor your lab values.  Discharge Instructions    Diet - low sodium heart healthy    Complete by:  As directed      Increase activity slowly  Complete by:  As directed           Current Discharge Medication List    START taking these medications   Details  cefTRIAXone (ROCEPHIN) 1 G SOLR injection Inject 1 g into the vein daily. Qty: 10 each, Refills: 0    colchicine 0.6 MG tablet Take 1 tablet (0.6 mg total) by mouth daily. Qty: 30 tablet, Refills: 0    oxyCODONE-acetaminophen (PERCOCET/ROXICET) 5-325 MG per tablet Take 1 tablet by mouth every 6 (six) hours as needed for moderate pain or severe pain. Qty: 10 tablet, Refills: 0    polyethylene glycol (MIRALAX / GLYCOLAX)  packet Take 17 g by mouth daily. Qty: 14 each, Refills: 0    predniSONE (DELTASONE) 20 MG tablet Take 2 tablets (40 mg total) by mouth daily with breakfast. Qty: 2 tablet, Refills: 0    Vancomycin (VANCOCIN) 750 MG/150ML SOLN Inject 150 mLs (750 mg total) into the vein every 12 (twelve) hours.      CONTINUE these medications which have CHANGED   Details  apixaban (ELIQUIS) 5 MG TABS tablet Take 1 tablet (5 mg total) by mouth 2 (two) times daily. Qty: 60 tablet, Refills: 0      CONTINUE these medications which have NOT CHANGED   Details  atorvastatin (LIPITOR) 10 MG tablet Take 10 mg by mouth daily.    fenofibrate micronized (LOFIBRA) 134 MG capsule Take 134 mg by mouth daily before breakfast.    furosemide (LASIX) 40 MG tablet Take 40 mg by mouth daily.     metoprolol (LOPRESSOR) 50 MG tablet Take 1 tablet (50 mg total) by mouth 2 (two) times daily. Qty: 45 tablet, Refills: 6    ranitidine (ZANTAC) 150 MG tablet Take 150 mg by mouth at bedtime.     Tamsulosin HCl (FLOMAX) 0.4 MG CAPS Take 1 capsule (0.4 mg total) by mouth daily. Qty: 30 capsule, Refills: 0    clopidogrel (PLAVIX) 75 MG tablet TAKE 1 TABLET BY MOUTH DAILY Qty: 30 tablet, Refills: 9      STOP taking these medications     hydrochlorothiazide (MICROZIDE) 12.5 MG capsule        Allergies  Allergen Reactions  . Nitrostat [Nitroglycerin]     Causes blood pressure to "bottom out"   Follow-up Information    Follow up with Horatio Pel, MD.   Specialty:  Internal Medicine   Why:  As scheduled.  Discuss with PCP being on Eliquis was for DVT given renal function.   Contact information:   [address] [place] [address] [phone]        The results of significant diagnostics from this hospitalization (including imaging, microbiology, ancillary and laboratory) are listed below for reference.    Significant Diagnostic Studies: Mr Cervical Spine Wo Contrast  12/01/2013    CLINICAL DATA:  Mid back pain radiating to shoulders for 2 days, no numbness or tingling in extremities.  EXAM: MRI CERVICAL AND THORACIC SPINE WITHOUT CONTRAST  TECHNIQUE: Multiplanar and multiecho pulse sequences of the cervical spine, to include the craniocervical junction and cervicothoracic junction, and thoracic spine, were obtained without intravenous contrast.  COMPARISON:  None.  FINDINGS: MRI CERVICAL SPINE FINDINGS  Cervical vertebral bodies and posterior elements are intact and aligned with maintenance of the cervical lordosis. 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 Intervertebral disc morphology generally preserved with decreased T2 signal within all cervical disc consistent with moderate desiccation. Moderate  chronic discogenic endplate changes O3-7 through C6-7. No STIR signal abnormality to suggest acute osseous process.  Cervical spinal cord appears normal morphology and signal characteristics from the cervicomedullary junction to the level of T1-2, with the most caudal well visualized level. Craniocervical junction is intact. Prevertebral paraspinal soft tissues are nonsuspicious. Post signal probable pannus about the odontoid process can be seen with CPPD.  Level by level evaluation:  C2-3: No disc bulge. Uncovertebral hypertrophy. Moderate to severe LEFT and moderate RIGHT facet arthropathy without superimposed canal stenosis. Mild to moderate LEFT neural foraminal narrowing.  C3-4: No disc bulge. Uncovertebral hypertrophy and mild to moderate facet arthropathy without superimposed canal stenosis. Mild RIGHT neural foraminal narrowing.  C4-5: 22 mm annular bulging, uncovertebral hypertrophy and mild facet arthropathy. Mild canal stenosis. Mild RIGHT greater LEFT neural foraminal narrowing.  C5-6: 2 mm broad-based disc bulge asymmetric to the LEFT, uncovertebral hypertrophy and mild facet arthropathy. Moderate to severe canal  stenosis with ventral and dorsal cord deformity. Moderate to severe RIGHT, severe LEFT neural foraminal narrowing.  C6-7: Annular bulging, uncovertebral hypertrophy and mild facet arthropathy. Moderate canal stenosis. Moderate to severe RIGHT, moderate LEFT neural foraminal narrowing.  C7-T1: Annular bulging, uncovertebral hypertrophy and mild facet arthropathy without canal stenosis. Moderate RIGHT, mild LEFT neural foraminal narrowing.  MRI THORACIC SPINE FINDINGS  Thoracic vertebral bodies and posterior elements are intact aligned and maintenance of thoracic kyphosis. Intervertebral disc morphology is preserved. RIGHT T1, T2 signal within T4-5, T5-6 and T7-8 disc consist with mineralization, and mildly desiccated remaining thoracic disc. Mild chronic discogenic endplate changes T 85/8850 and to lesser extent the remaining lower thoracic vertebral bodies ventrally, with no STIR signal abnormality to suggest acute osseous process. Scattered chronic Schmorl's nodes.  Thoracic spinal cord appears normal morphology and signal characteristics, conus medullaris below the level L1, not imaged. Mild epidural lipomatosis. Median sternotomy noted. Possible cardiomegaly, incompletely characterized. Mild paraspinal muscle atrophy.  Mild facet arthropathy and focal epidural lipomatosis at T1-2 results in mild effacement of thecal sac without osseous canal stenosis. Moderate lower lumbar facet arthropathy.  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  Neural foraminal narrowing at all cervical levels: Severe on the LEFT at C5-6.  MRI THORACIC SPINE  Degenerative change of the thoracic spine without acute fracture or malalignment.  Epidural lipomatosis results in mild thecal sac effacement at T1-2 without osseous canal stenosis or neural  foraminal narrowing at any level.   Electronically Signed   By: Elon Alas   On: 12/01/2013 21:09   Mr Thoracic Spine Wo Contrast  12/01/2013   CLINICAL DATA:  Mid back pain radiating to shoulders for 2 days, no numbness or tingling in extremities.  EXAM: MRI CERVICAL AND THORACIC SPINE WITHOUT CONTRAST  TECHNIQUE: Multiplanar and multiecho pulse sequences of the cervical spine, to include the craniocervical junction and cervicothoracic junction, and thoracic spine, were obtained without intravenous contrast.  COMPARISON:  None.  FINDINGS: MRI CERVICAL SPINE FINDINGS  Cervical vertebral bodies and posterior elements are intact and aligned with maintenance of the cervical lordosis. 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 Intervertebral disc morphology generally preserved with decreased T2 signal within all cervical disc consistent with moderate desiccation. Moderate chronic discogenic endplate changes Y7-7 through C6-7. No STIR signal abnormality to suggest acute  osseous process.  Cervical spinal cord appears normal morphology and signal characteristics from the cervicomedullary junction to the level of T1-2, with the most caudal well visualized level. Craniocervical junction is intact. Prevertebral paraspinal soft tissues are nonsuspicious. Post signal probable pannus about the odontoid process can be seen with CPPD.  Level by level evaluation:  C2-3: No disc bulge. Uncovertebral hypertrophy. Moderate to severe LEFT and moderate RIGHT facet arthropathy without superimposed canal stenosis. Mild to moderate LEFT neural foraminal narrowing.  C3-4: No disc bulge. Uncovertebral hypertrophy and mild to moderate facet arthropathy without superimposed canal stenosis. Mild RIGHT neural foraminal narrowing.  C4-5: 22 mm annular bulging, uncovertebral hypertrophy and mild facet arthropathy. Mild canal stenosis. Mild RIGHT greater LEFT neural  foraminal narrowing.  C5-6: 2 mm broad-based disc bulge asymmetric to the LEFT, uncovertebral hypertrophy and mild facet arthropathy. Moderate to severe canal stenosis with ventral and dorsal cord deformity. Moderate to severe RIGHT, severe LEFT neural foraminal narrowing.  C6-7: Annular bulging, uncovertebral hypertrophy and mild facet arthropathy. Moderate canal stenosis. Moderate to severe RIGHT, moderate LEFT neural foraminal narrowing.  C7-T1: Annular bulging, uncovertebral hypertrophy and mild facet arthropathy without canal stenosis. Moderate RIGHT, mild LEFT neural foraminal narrowing.  MRI THORACIC SPINE FINDINGS  Thoracic vertebral bodies and posterior elements are intact aligned and maintenance of thoracic kyphosis. Intervertebral disc morphology is preserved. RIGHT T1, T2 signal within T4-5, T5-6 and T7-8 disc consist with mineralization, and mildly desiccated remaining thoracic disc. Mild chronic discogenic endplate changes T 32/6712 and to lesser extent the remaining lower thoracic vertebral bodies ventrally, with no STIR signal abnormality to suggest acute osseous process. Scattered chronic Schmorl's nodes.  Thoracic spinal cord appears normal morphology and signal characteristics, conus medullaris below the level L1, not imaged. Mild epidural lipomatosis. Median sternotomy noted. Possible cardiomegaly, incompletely characterized. Mild paraspinal muscle atrophy.  Mild facet arthropathy and focal epidural lipomatosis at T1-2 results in mild effacement of thecal sac without osseous canal stenosis. Moderate lower lumbar facet arthropathy.  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  Neural foraminal narrowing at all cervical levels: Severe on the LEFT at C5-6.  MRI THORACIC SPINE  Degenerative change of the thoracic  spine without acute fracture or malalignment.  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   Electronically Signed   By: Elon Alas   On: 12/01/2013 21:09   Mr Lumbar Spine Wo Contrast  12/03/2013   CLINICAL DATA:  73 year old male with back pain and fever. Initial encounter.  EXAM: MRI LUMBAR SPINE WITHOUT CONTRAST  TECHNIQUE: Multiplanar, multisequence MR imaging of the lumbar spine was performed. No intravenous contrast was administered.  COMPARISON:  Thoracic and cervical MRI 12/01/2013.  FINDINGS: Same numbering system used as on the recent thoracic comparison, which results in normal lumbar segmentation. Lumbar vertebral height and alignment within normal limits. No marrow edema or evidence of acute osseous abnormality.  Fairly extensive abnormal increased T2 and STIR signal in the posterior paraspinal soft tissues greater on the right. Soft tissues surrounding the chronically degenerated lumbar facet are affected, as well as some of the erector spine I muscles (primarily the right). See series 5, image 7. Posteriorly situated synovial cysts at most levels. No fluid collections suspicious for abscess identified.  However, there is  fluid tracking along the right iliacus muscle seen only on axial images (series 6, image 15).  No epidural inflammation identified.  Negative visualized abdominal viscera. Visualized lower thoracic spinal cord is normal with conus medularis at L1.  T12-L1:  Mild facet hypertrophy.  L1-L2:  Mild facet hypertrophy.  L2-L3: Circumferential disc bulge. Moderate to severe facet hypertrophy. Mild spinal stenosis (series 6, image 10).  L3-L4: Circumferential disc bulge. Severe facet hypertrophy. Mild to moderate spinal and bilateral L3 foraminal stenosis.  L4-L5: Circumferential disc bulge. Severe facet and ligament flavum hypertrophy. Severe spinal stenosis (series 6, image 23).  Mild to moderate bilateral L4 foraminal stenosis.  L5-S1: Epidural lipomatosis. Circumferential disc osteophyte complex. Severe facet hypertrophy. Severe spinal stenosis (series 6, image 29). Capacious facets with joint fluid. Multiple subchondral cysts on the right (series 6, image 29). Moderate left and mild right L5 foraminal stenosis.  Visible sacrum intact.  IMPRESSION: 1. Widespread abnormal soft tissue inflammation in the posterior paraspinal soft tissues, primarily the right, surrounding chronically degenerated lumbar facets. Small volume of more free appearing fluid tracking along the right iliacus muscle. 2. No marrow edema to suggest a septic facet joint at this time, however, recommend correlation with blood cultures as the appearance is suspicious for paraspinal cellulitis/myositis in this setting. 3. Widespread degenerative lumbar spinal stenosis ranging from mild (L2-L3) to severe (L4-L5 and L5-S1). 4. If the patient's fever source remains occult, recommend also repeat MRI (without and with contrast preferred) of the cervicothoracic junction where similar soft tissue inflammation and nonspecific posterior epidural signal was demonstrated on 12/01/2013.   Electronically Signed   By: Lars Pinks M.D.   On: 12/03/2013 19:45   Nm Pulmonary Perf And Vent  12/01/2013   CLINICAL DATA:  LEFT-sided chest pain. Short of breath. Chest pain with breathing. Chest pain on breathing R07.1 (ICD-10-CM)  EXAM: NUCLEAR MEDICINE VENTILATION - PERFUSION LUNG SCAN  TECHNIQUE: Ventilation images were obtained in multiple projections using inhaled aerosol technetium 99 M DTPA. Perfusion images were obtained in multiple projections after intravenous injection of Tc-9mMAA.  RADIOPHARMACEUTICALS:  40.0 mCi Tc-951mTPA aerosol and 6.0 mCi Tc-9957mA  COMPARISON:  Chest radiograph today.  FINDINGS: Ventilation: Heterogeneous ventilation with clumping of radiotracer error in the second order bronchi.  Perfusion: No wedge  shaped peripheral perfusion defects to suggest acute pulmonary embolism. Perfusion appears normal.  IMPRESSION: Low probability of pulmonary embolus.   Electronically Signed   By: GeoDereck LigasD.   On: 12/01/2013 17:19   Dg Chest Port 1 View  12/01/2013   CLINICAL DATA:  Neck and back pain for 3 days  EXAM: PORTABLE CHEST - 1 VIEW  COMPARISON:  04/11/2010  FINDINGS: Cardiac shadow is enlarged. Postsurgical changes are again seen. No focal infiltrate or sizable effusion is seen. No acute bony abnormality is noted.  IMPRESSION: No acute abnormality noted.   Electronically Signed   By: MarInez CatalinaD.   On: 12/01/2013 14:56    Microbiology: Recent Results (from the past 240 hour(s))  Urine culture     Status: None   Collection Time: 12/01/13  8:39 PM  Result Value Ref Range Status   Specimen Description URINE, CLEAN CATCH  Final   Special Requests NONE  Final   Culture  Setup Time   Final    12/01/2013 23:11 Performed at [facility]    50,000 COLONIES/ML Performed at [facility]  Final    Multiple bacterial morphotypes present, none predominant. Suggest appropriate recollection if clinically indicated. Performed at [facility]    Report Status 12/03/2013 FINAL  Final  Blood culture (routine x 2)     Status: None   Collection Time: 12/02/13 12:30 AM  Result Value Ref Range Status   Specimen Description BLOOD LEFT ANTECUBITAL  Final   Special Requests BOTTLES DRAWN AEROBIC AND ANAEROBIC 5CC EA  Final   Culture  Setup Time   Final    12/02/2013 04:45 Performed at [facility]   Final    NO GROWTH 5 DAYS Performed at [facility]    Report Status 12/08/2013 FINAL  Final  Blood culture (routine x 2)     Status: None   Collection Time: 12/02/13  1:13 AM  Result Value Ref Range Status   Specimen Description BLOOD LEFT HAND  Final   Special Requests BOTTLES DRAWN AEROBIC AND ANAEROBIC  5CC EA  Final   Culture  Setup Time   Final    12/02/2013 10:36 Performed at [facility]   Final    NO GROWTH 5 DAYS Performed at [facility]    Report Status 12/08/2013 FINAL  Final  Urine culture     Status: None   Collection Time: 12/03/13  9:21 AM  Result Value Ref Range Status   Specimen Description URINE, CLEAN CATCH  Final   Special Requests none  Final   Culture  Setup Time   Final    12/03/2013 18:23 Performed at [facility]   Final    25,000 COLONIES/ML Performed at [facility]    Culture   Final    Multiple bacterial morphotypes present, none predominant. Suggest appropriate recollection if clinically indicated. Performed at [facility]    Report Status 12/05/2013 FINAL  Final  Culture, blood (routine x 2)     Status: None (Preliminary result)   Collection Time: 12/04/13  9:30 AM  Result Value Ref Range Status   Specimen Description BLOOD RIGHT HAND  Final   Special Requests BOTTLES DRAWN AEROBIC ONLY [facility]  Final   Culture  Setup Time   Final    12/04/2013 14:09 Performed at [facility]    Culture   Final           BLOOD CULTURE RECEIVED NO GROWTH TO DATE CULTURE WILL BE HELD FOR 5 DAYS BEFORE ISSUING A FINAL NEGATIVE REPORT Performed at [facility]    Report Status PENDING  Incomplete  Culture, blood (routine x 2)     Status: None (Preliminary result)   Collection Time: 12/04/13  9:50 AM  Result Value Ref Range Status   Specimen Description BLOOD LEFT HAND  Final   Special Requests BOTTLES DRAWN AEROBIC ONLY 3CC  Final   Culture  Setup Time   Final    12/04/2013 14:05 Performed at [facility]    Culture   Final           BLOOD CULTURE RECEIVED NO GROWTH TO DATE CULTURE WILL BE HELD FOR 5 DAYS BEFORE ISSUING A FINAL NEGATIVE REPORT Performed at [facility]    Report Status PENDING  Incomplete     Labs: Basic Metabolic Panel:  Recent  Labs Lab 12/04/13 0732 12/05/13 0610 12/06/13 0235 12/08/13 0437 12/09/13 0445  NA 138 137 136* 141 140  K 4.0 4.5  4.2 4.2 4.0  CL 99 97 98 105 103  CO2 25 24 23 23 24   GLUCOSE 126* 116* 115* 102* 90  BUN 46* 46* 49* 39* 36*  CREATININE 1.97* 1.78* 1.70* 1.33 1.25  CALCIUM 9.4 9.6 9.3 8.9 9.2   Liver Function Tests: No results for input(s): AST, ALT, ALKPHOS, BILITOT, PROT, ALBUMIN in the last 168 hours. No results for input(s): LIPASE, AMYLASE in the last 168 hours. No results for input(s): AMMONIA in the last 168 hours. CBC:  Recent Labs Lab 12/04/13 0820 12/07/13 0435 12/08/13 0437  WBC 10.4 7.0 7.0  HGB 12.7* 10.4* 10.3*  HCT 38.5* 32.0* 32.0*  MCV 92.8 90.7 91.2  PLT 196 248 272   Cardiac Enzymes:  Recent Labs Lab 12/02/13 1350 12/07/13 0435  CKTOTAL  --  683*  TROPONINI <0.30  --    BNP: BNP (last 3 results) No results for input(s): PROBNP in the last 8760 hours. CBG: No results for input(s): GLUCAP in the last 168 hours.     Signed:  Charlynne Cousins  [facility] 12/09/2013, 11:15 AM

## 2013-12-09 NOTE — Progress Notes (Addendum)
Pt discharged to [facility] Discharge instructions given to EMS PICC disconnected and heparinized per IV team  Tele dc'd  Pt discharged via stretcher, all pt belongs at side.  Sherrie Mustache 2:58 PM   Report called. 3:07 PM

## 2013-12-10 LAB — CULTURE, BLOOD (ROUTINE X 2)
Culture: NO GROWTH
Culture: NO GROWTH

## 2013-12-29 ENCOUNTER — Encounter: Payer: Self-pay | Admitting: Internal Medicine

## 2013-12-29 ENCOUNTER — Ambulatory Visit (INDEPENDENT_AMBULATORY_CARE_PROVIDER_SITE_OTHER): Payer: Medicare Other | Admitting: Internal Medicine

## 2013-12-29 VITALS — BP 176/89 | HR 77 | Temp 98.3°F | Ht 66.0 in | Wt 197.2 lb

## 2013-12-29 DIAGNOSIS — IMO0001 Reserved for inherently not codable concepts without codable children: Secondary | ICD-10-CM

## 2013-12-29 DIAGNOSIS — M791 Myalgia: Secondary | ICD-10-CM

## 2013-12-29 DIAGNOSIS — M609 Myositis, unspecified: Secondary | ICD-10-CM

## 2013-12-29 NOTE — Progress Notes (Signed)
Patient ID: Paul Price, male   DOB: 22-Mar-1940, 73 y.o.   MRN: [id number]         [facility] for Infectious Disease  Patient Active Problem List   Diagnosis Date Noted  . 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 Fever 12/02/2013    Priority: High  . Primary gout   . Syncope 12/02/2013  . Left leg DVT 12/02/2013  . Renal insufficiency 07/02/2013  . PAF (paroxysmal atrial fibrillation) 01/04/2013  . Sinus pause, 4.3 seconds.  Recorded by Cardionet. 12/02/2012  . NSVT (nonsustained ventricular tachycardia) 12/02/2012  . Dyspnea on exertion 11/20/2012  . 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 HTN (hypertension) 08/20/2012  . Hyperlipidemia with target LDL less than 70 08/20/2012  . Obesity (BMI 30-39.9) 08/20/2012  . OSA on CPAP 08/20/2012  . RBBB 08/20/2012    Patient's Medications  New Prescriptions   No medications on file  Previous Medications   APIXABAN (ELIQUIS) 5 MG TABS TABLET    Take 1 tablet (5 mg total) by mouth 2 (two) times daily.   ATORVASTATIN (LIPITOR) 10 MG TABLET    Take 10 mg by mouth daily.   CLOPIDOGREL (PLAVIX) 75 MG TABLET    TAKE 1 TABLET BY MOUTH DAILY   COLCHICINE 0.6 MG TABLET    Take 1 tablet (0.6 mg total) by mouth daily.   FENOFIBRATE MICRONIZED (LOFIBRA) 134 MG CAPSULE    Take 134 mg by mouth daily before breakfast.   FUROSEMIDE (LASIX) 40 MG TABLET    Take 40 mg by mouth daily.    METOPROLOL (LOPRESSOR) 50 MG TABLET    Take 1 tablet (50 mg total) by mouth 2 (two) times daily.   OXYCODONE-ACETAMINOPHEN (PERCOCET/ROXICET) 5-325 MG PER TABLET    Take 1 tablet by mouth every 6 (six) hours as needed for moderate pain or severe pain.   POLYETHYLENE GLYCOL (MIRALAX / GLYCOLAX) PACKET    Take 17 g by mouth daily.   PREDNISONE (DELTASONE) 20 MG TABLET    Take 2 tablets (40 mg total) by mouth daily with breakfast.   RANITIDINE (ZANTAC) 150 MG TABLET    Take 150 mg by mouth at bedtime.    TAMSULOSIN HCL (FLOMAX) 0.4  MG CAPS    Take 1 capsule (0.4 mg total) by mouth daily.  Modified Medications   No medications on file  Discontinued Medications   VANCOMYCIN (VANCOCIN) 750 MG/150ML SOLN    Inject 150 mLs (750 mg total) into the vein every 12 (twelve) hours.    Subjective: Paul Price is in for his hospital follow-up visit with his wife. He was hospitalized last month with high fever and sudden onset of severe back pain. 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 Blood cultures were negative. He was started on empiric vancomycin and ceftriaxone and improved promptly. He completed 24 days of therapy yesterday. He has not had any problems tolerating his antibiotics or PICC.  Review of Systems: Pertinent items are noted in HPI.  Past Medical History  Diagnosis Date  . Hypertension   . Coronary artery disease   . History of percutaneous left heart catheterization 04/2010  . Obesity   . OSA on CPAP   . Hx of echocardiogram 05/2009    EF 40-45%, he did have mild annular calcification with mild-to-moderate MR and mild TR as well as aortic valve sclerosis. Estimated RV systolic pressure was 21 mm.  . History of  stress test 06/2011    No significant ischemia, this is a low risk scan. Clinical correlation recommended Abnormal myocardial perfusion study.  Marland Kitchen RBBB   . BPH (benign prostatic hyperplasia)     History  Substance Use Topics  . Smoking status: Former Research scientist (life sciences)  . Smokeless tobacco: Never Used     Comment: quit ~ 34 years ago  . Alcohol Use: No    Family History  Problem Relation Age of Onset  . Heart disease Father     Allergies  Allergen Reactions  . Nitrostat [Nitroglycerin]     Causes blood pressure to "bottom out"    Objective: Temp: 98.3 F (36.8 C) (12/21 1346) Temp Source: Oral (12/21 1346) BP: 176/89 mmHg (12/21 1346) Pulse Rate: 77 (12/21 1346)  General: He is smiling and in good spirits Skin: Right arm PICC site appears normal Lungs: Clear Cor: Distant  but regular S1 and S2 with no murmur Back: No pain on palpation. No swelling or other signs of inflammation.   Assessment: He is much better. I will have his PICC removed and observe off of antibiotics.  Plan: 1. Observe off of antibiotics 2. Remove PICC 3. Follow-up in 6 weeks   Michel Bickers, MD [facility] for [place] [phone] pager   [phone] cell 12/29/2013, 2:08 PM

## 2013-12-29 NOTE — Progress Notes (Signed)
RN received verbal order to discontinue the patient's PICC line.  Patient identified with name and date of birth. PICC dressing removed, site unremarkable.  PICC line removed using sterile procedure @ 1415. PICC length equal to that noted in patient's hospital chart of 39 cm. Sterile petroleum gauze + sterile 4X4 applied to PICC site, pressure applied for 10 minutes and covered with Medipore tape as a pressure dressing. Patient tolerated procedure without complaints.  Patient instructed to limit use of arm for 1 hour. Patient instructed that the pressure dressing should remain in place for 24 hours. Patient verbalized understanding of these instructions.

## 2013-12-31 ENCOUNTER — Inpatient Hospital Stay: Payer: Medicare Other | Admitting: Internal Medicine

## 2014-01-21 DIAGNOSIS — M542 Cervicalgia: Secondary | ICD-10-CM | POA: Diagnosis not present

## 2014-01-29 DIAGNOSIS — N183 Chronic kidney disease, stage 3 (moderate): Secondary | ICD-10-CM | POA: Diagnosis not present

## 2014-01-29 DIAGNOSIS — M109 Gout, unspecified: Secondary | ICD-10-CM | POA: Diagnosis not present

## 2014-02-05 DIAGNOSIS — G4733 Obstructive sleep apnea (adult) (pediatric): Secondary | ICD-10-CM | POA: Diagnosis not present

## 2014-02-10 ENCOUNTER — Ambulatory Visit (INDEPENDENT_AMBULATORY_CARE_PROVIDER_SITE_OTHER): Payer: Medicare Other | Admitting: Internal Medicine

## 2014-02-10 ENCOUNTER — Encounter: Payer: Self-pay | Admitting: Internal Medicine

## 2014-02-10 DIAGNOSIS — M6089 Other myositis, multiple sites: Secondary | ICD-10-CM

## 2014-02-10 DIAGNOSIS — M6080 Other myositis, unspecified site: Secondary | ICD-10-CM

## 2014-02-10 LAB — C-REACTIVE PROTEIN: CRP: <0.5 mg/dL (ref <0.60)

## 2014-02-10 LAB — SEDIMENTATION RATE: Sed Rate: 7 mm/hr (ref 0–16)

## 2014-02-10 NOTE — Progress Notes (Signed)
Patient ID: Paul Price, male   DOB: December 26, 1940, 74 y.o.   MRN: [id number]         [facility] for Infectious Disease  Patient Active Problem List   Diagnosis Date Noted  . 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 Fever 12/02/2013    Priority: High  . Primary gout   . Syncope 12/02/2013  . Left leg DVT 12/02/2013  . Renal insufficiency 07/02/2013  . PAF (paroxysmal atrial fibrillation) 01/04/2013  . Sinus pause, 4.3 seconds.  Recorded by Cardionet. 12/02/2012  . NSVT (nonsustained ventricular tachycardia) 12/02/2012  . Dyspnea on exertion 11/20/2012  . 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 HTN (hypertension) 08/20/2012  . Hyperlipidemia with target LDL less than 70 08/20/2012  . Obesity (BMI 30-39.9) 08/20/2012  . OSA on CPAP 08/20/2012  . RBBB 08/20/2012    Patient's Medications  New Prescriptions   No medications on file  Previous Medications   APIXABAN (ELIQUIS) 5 MG TABS TABLET    Take 1 tablet (5 mg total) by mouth 2 (two) times daily.   ATORVASTATIN (LIPITOR) 10 MG TABLET    Take 10 mg by mouth daily.   CLOPIDOGREL (PLAVIX) 75 MG TABLET    TAKE 1 TABLET BY MOUTH DAILY   FENOFIBRATE MICRONIZED (LOFIBRA) 134 MG CAPSULE    Take 134 mg by mouth daily before breakfast.   LOSARTAN (COZAAR) 100 MG TABLET    Take 100 mg by mouth daily.   METOPROLOL (LOPRESSOR) 50 MG TABLET    Take 1 tablet (50 mg total) by mouth 2 (two) times daily.   RANITIDINE (ZANTAC) 150 MG TABLET    Take 150 mg by mouth at bedtime.    TAMSULOSIN HCL (FLOMAX) 0.4 MG CAPS    Take 1 capsule (0.4 mg total) by mouth daily.  Modified Medications   No medications on file  Discontinued Medications   COLCHICINE 0.6 MG TABLET    Take 1 tablet (0.6 mg total) by mouth daily.   FUROSEMIDE (LASIX) 40 MG TABLET    Take 40 mg by mouth daily.    OXYCODONE-ACETAMINOPHEN (PERCOCET/ROXICET) 5-325 MG PER TABLET    Take 1 tablet by mouth every 6 (six) hours as needed for moderate  pain or severe pain.   POLYETHYLENE GLYCOL (MIRALAX / GLYCOLAX) PACKET    Take 17 g by mouth daily.   PREDNISONE (DELTASONE) 20 MG TABLET    Take 2 tablets (40 mg total) by mouth daily with breakfast.    Subjective: Paul Price is in for routine follow-up of his paraspinous myositis. He completed 24 days of IV vancomycin and ceftriaxone on December 20 has empiric therapy for his paraspinous myositis. His fever and back pain resolved promptly. He is also been on apixaban recently for a left leg DVT. Over the last few weeks he has developed some neck pain that he rates about 5 out of 10. He wonders if he slept on it wrong. He did not have any injury. He's also having some anterior lower leg pain bilaterally. He states that the pain is there all the time but gets worse with walking and other forms of exertion. It improves if he sits down. Neither pain is bad enough for him to take anything for pain. The pains are not getting progressively worse. He has not had any fever, chills or sweats. Review of Systems: Constitutional: negative Eyes: negative Ears, nose, mouth, throat, and face: negative Respiratory: negative  Cardiovascular: negative Gastrointestinal: negative Genitourinary:negative Musculoskeletal:as noted in history of present illness  Past Medical History  Diagnosis Date  . Hypertension   . Coronary artery disease   . History of percutaneous left heart catheterization 04/2010  . Obesity   . OSA on CPAP   . Hx of echocardiogram 05/2009    EF 40-45%, he did have mild annular calcification with mild-to-moderate MR and mild TR as well as aortic valve sclerosis. Estimated RV systolic pressure was 21 mm.  . History of stress test 06/2011    No significant ischemia, this is a low risk scan. Clinical correlation recommended Abnormal myocardial perfusion study.  Marland Kitchen RBBB   . BPH (benign prostatic hyperplasia)     History  Substance Use Topics  . Smoking status: Former Research scientist (life sciences)  . Smokeless  tobacco: Never Used     Comment: quit ~ 34 years ago  . Alcohol Use: No    Family History  Problem Relation Age of Onset  . Heart disease Father     Allergies  Allergen Reactions  . Nitrostat [Nitroglycerin]     Causes blood pressure to "bottom out"    Objective: Temp: 97.8 F (36.6 C) (02/02 0923) Temp Source: Oral (02/02 0923) BP: 154/90 mmHg (02/02 0923) Pulse Rate: 60 (02/02 0923)  General: He is well dressed and in good spirits Skin: No rash Neck: Some mild discomfort with flexion and rotation Lungs: Clear Cor: Regular S1 and S2 with no murmurs Abdomen: Obese, soft and nontender Extremities: Mild nonpitting edema of his left leg to the level of the knee. No pain with palpation of his muscles. No redness, warmth or other signs of active inflammation. His lower legs and feet are warm and well perfused  SED RATE (mm/hr)  Date Value  12/04/2013 115*     Assessment: I doubt that his leg pain or neck pain reflects relapse of infectious myositis. I am hopeful that his myositis has been cured.  I will repeat his inflammatory markers today and continue observation off of antibiotics.   It is unlikely that his neck pain reflects cervical discitis. I would expect that his pain would be much more severe if it was due to infection and it would be getting progressively worse.   Although his leg pain is exertional he describes the location has been anterior shin which is unlike vascular or neurogenic claudication. I also doubt that this is a side effect from his apixaban.  Plan: 1. Continue observation off of antibiotics 2. Check sedimentation rate and C-reactive protein 3. Follow-up in one month   Michel Bickers, MD [facility] for [place] [phone] pager   [phone] cell 02/10/2014, 9:54 AM

## 2014-02-16 DIAGNOSIS — G4733 Obstructive sleep apnea (adult) (pediatric): Secondary | ICD-10-CM | POA: Diagnosis not present

## 2014-02-18 DIAGNOSIS — M6009 Infective myositis, multiple sites: Secondary | ICD-10-CM | POA: Diagnosis not present

## 2014-02-18 DIAGNOSIS — N184 Chronic kidney disease, stage 4 (severe): Secondary | ICD-10-CM | POA: Diagnosis not present

## 2014-02-18 DIAGNOSIS — N179 Acute kidney failure, unspecified: Secondary | ICD-10-CM | POA: Diagnosis not present

## 2014-02-18 DIAGNOSIS — I10 Essential (primary) hypertension: Secondary | ICD-10-CM | POA: Diagnosis not present

## 2014-02-19 DIAGNOSIS — N179 Acute kidney failure, unspecified: Secondary | ICD-10-CM | POA: Diagnosis not present

## 2014-02-20 DIAGNOSIS — D8989 Other specified disorders involving the immune mechanism, not elsewhere classified: Secondary | ICD-10-CM | POA: Diagnosis not present

## 2014-02-20 DIAGNOSIS — N179 Acute kidney failure, unspecified: Secondary | ICD-10-CM | POA: Diagnosis not present

## 2014-02-26 DIAGNOSIS — N179 Acute kidney failure, unspecified: Secondary | ICD-10-CM | POA: Diagnosis not present

## 2014-02-26 DIAGNOSIS — M4802 Spinal stenosis, cervical region: Secondary | ICD-10-CM | POA: Diagnosis not present

## 2014-02-26 DIAGNOSIS — M109 Gout, unspecified: Secondary | ICD-10-CM | POA: Diagnosis not present

## 2014-02-26 DIAGNOSIS — I10 Essential (primary) hypertension: Secondary | ICD-10-CM | POA: Diagnosis not present

## 2014-03-08 DIAGNOSIS — G4733 Obstructive sleep apnea (adult) (pediatric): Secondary | ICD-10-CM | POA: Diagnosis not present

## 2014-03-12 ENCOUNTER — Ambulatory Visit (INDEPENDENT_AMBULATORY_CARE_PROVIDER_SITE_OTHER): Payer: Medicare Other | Admitting: Internal Medicine

## 2014-03-12 ENCOUNTER — Encounter: Payer: Self-pay | Admitting: Internal Medicine

## 2014-03-12 VITALS — BP 161/81 | HR 66 | Temp 97.8°F | Wt 202.8 lb

## 2014-03-12 DIAGNOSIS — M6089 Other myositis, multiple sites: Secondary | ICD-10-CM

## 2014-03-12 DIAGNOSIS — M6080 Other myositis, unspecified site: Secondary | ICD-10-CM

## 2014-03-12 NOTE — Progress Notes (Signed)
Patient ID: Paul Price, male   DOB: March 08, 1940, 74 y.o.   MRN: [id number]         [facility] for Infectious Disease  Patient Active Problem List   Diagnosis Date Noted  . 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 Fever 12/02/2013    Priority: High  . Primary gout   . Syncope 12/02/2013  . Left leg DVT 12/02/2013  . Renal insufficiency 07/02/2013  . PAF (paroxysmal atrial fibrillation) 01/04/2013  . Sinus pause, 4.3 seconds.  Recorded by Cardionet. 12/02/2012  . NSVT (nonsustained ventricular tachycardia) 12/02/2012  . Dyspnea on exertion 11/20/2012  . 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 HTN (hypertension) 08/20/2012  . Hyperlipidemia with target LDL less than 70 08/20/2012  . Obesity (BMI 30-39.9) 08/20/2012  . OSA on CPAP 08/20/2012  . RBBB 08/20/2012    Patient's Medications  New Prescriptions   No medications on file  Previous Medications   APIXABAN (ELIQUIS) 5 MG TABS TABLET    Take 1 tablet (5 mg total) by mouth 2 (two) times daily.   ATORVASTATIN (LIPITOR) 10 MG TABLET    Take 10 mg by mouth daily.   CLOPIDOGREL (PLAVIX) 75 MG TABLET    TAKE 1 TABLET BY MOUTH DAILY   FENOFIBRATE MICRONIZED (LOFIBRA) 134 MG CAPSULE    Take 134 mg by mouth daily before breakfast.   FUROSEMIDE (LASIX) 40 MG TABLET    Take 40 mg by mouth daily.   LOSARTAN (COZAAR) 100 MG TABLET    Take 100 mg by mouth daily.   METOPROLOL (LOPRESSOR) 50 MG TABLET    Take 1 tablet (50 mg total) by mouth 2 (two) times daily.   RANITIDINE (ZANTAC) 150 MG TABLET    Take 150 mg by mouth at bedtime.    TAMSULOSIN HCL (FLOMAX) 0.4 MG CAPS    Take 1 capsule (0.4 mg total) by mouth daily.  Modified Medications   No medications on file  Discontinued Medications   No medications on file    Subjective: Mr. [name] is in for his routine follow-up visit. He completed 4 weeks of empiric IV antibiotics for paraspinal myositis on December 20. He's had no further severe back  pain like he had when hospitalized. He has been able to be more active. He is now walking on the treadmill and hopes to start swinging a golf club soon. He still has intermittent neck pain and some posterior leg pain but these are improved. He had some transient pain in his left great toe and his right knee recently that resolved spontaneously after about 3 days. He wonders if he had a flare of gout. He has not had any fever, chills or sweats. Review of Systems: Pertinent items are noted in HPI.  Past Medical History  Diagnosis Date  . Hypertension   . Coronary artery disease   . History of percutaneous left heart catheterization 04/2010  . Obesity   . OSA on CPAP   . Hx of echocardiogram 05/2009    EF 40-45%, he did have mild annular calcification with mild-to-moderate MR and mild TR as well as aortic valve sclerosis. Estimated RV systolic pressure was 21 mm.  . History of stress test 06/2011    No significant ischemia, this is a low risk scan. Clinical correlation recommended Abnormal myocardial perfusion study.  Marland Kitchen RBBB   . BPH (benign prostatic hyperplasia)     History  Substance Use Topics  .  Smoking status: Former Research scientist (life sciences)  . Smokeless tobacco: Never Used     Comment: quit ~ 34 years ago  . Alcohol Use: No    Family History  Problem Relation Age of Onset  . Heart disease Father     Allergies  Allergen Reactions  . Nitrostat [Nitroglycerin]     Causes blood pressure to "bottom out"    Objective: Temp: 97.8 F (36.6 C) (03/03 1430) Temp Source: Oral (03/03 1430) BP: 161/81 mmHg (03/03 1430) Pulse Rate: 66 (03/03 1430)  General: He is comfortable and in good spirits Skin: No rash Lungs: Clear Cor: Regular S1 and S2 with no murmurs No paraspinal tenderness or acute joint abnormalities  Lab Results SED RATE (mm/hr)  Date Value  02/10/2014 7  12/04/2013 115*   CRP (mg/dL)  Date Value  02/10/2014 <0.5     Assessment: His paraspinal myositis has resolved and  I see no evidence of any active infection.  Plan: 1. Continue observation off of antibiotics 2. Follow-up here as needed   Paul Bickers, MD [facility] for [place] [phone] pager   [phone] cell 03/12/2014, 2:54 PM

## 2014-03-19 DIAGNOSIS — N529 Male erectile dysfunction, unspecified: Secondary | ICD-10-CM | POA: Diagnosis not present

## 2014-03-19 DIAGNOSIS — I10 Essential (primary) hypertension: Secondary | ICD-10-CM | POA: Diagnosis not present

## 2014-04-06 DIAGNOSIS — G4733 Obstructive sleep apnea (adult) (pediatric): Secondary | ICD-10-CM | POA: Diagnosis not present

## 2014-04-15 DIAGNOSIS — I10 Essential (primary) hypertension: Secondary | ICD-10-CM | POA: Diagnosis not present

## 2014-04-15 DIAGNOSIS — N184 Chronic kidney disease, stage 4 (severe): Secondary | ICD-10-CM | POA: Diagnosis not present

## 2014-04-15 DIAGNOSIS — N179 Acute kidney failure, unspecified: Secondary | ICD-10-CM | POA: Diagnosis not present

## 2014-04-15 DIAGNOSIS — M6009 Infective myositis, multiple sites: Secondary | ICD-10-CM | POA: Diagnosis not present

## 2014-04-16 DIAGNOSIS — I10 Essential (primary) hypertension: Secondary | ICD-10-CM | POA: Diagnosis not present

## 2014-04-25 ENCOUNTER — Emergency Department (HOSPITAL_COMMUNITY): Payer: Medicare Other

## 2014-04-25 ENCOUNTER — Emergency Department (HOSPITAL_COMMUNITY)
Admission: EM | Admit: 2014-04-25 | Discharge: 2014-04-25 | Disposition: A | Discharge status: Home / Self Care | Payer: Medicare Other | Attending: Emergency Medicine | Admitting: Emergency Medicine

## 2014-04-25 ENCOUNTER — Encounter (HOSPITAL_COMMUNITY): Payer: Self-pay | Admitting: Emergency Medicine

## 2014-04-25 DIAGNOSIS — E669 Obesity, unspecified: Secondary | ICD-10-CM | POA: Diagnosis not present

## 2014-04-25 DIAGNOSIS — M79642 Pain in left hand: Secondary | ICD-10-CM | POA: Diagnosis not present

## 2014-04-25 DIAGNOSIS — N400 Enlarged prostate without lower urinary tract symptoms: Secondary | ICD-10-CM | POA: Diagnosis not present

## 2014-04-25 DIAGNOSIS — I10 Essential (primary) hypertension: Secondary | ICD-10-CM | POA: Diagnosis not present

## 2014-04-25 DIAGNOSIS — Z950 Presence of cardiac pacemaker: Secondary | ICD-10-CM | POA: Insufficient documentation

## 2014-04-25 DIAGNOSIS — M109 Gout, unspecified: Secondary | ICD-10-CM | POA: Diagnosis not present

## 2014-04-25 DIAGNOSIS — Z79899 Other long term (current) drug therapy: Secondary | ICD-10-CM | POA: Insufficient documentation

## 2014-04-25 DIAGNOSIS — R52 Pain, unspecified: Secondary | ICD-10-CM | POA: Diagnosis not present

## 2014-04-25 DIAGNOSIS — Z9981 Dependence on supplemental oxygen: Secondary | ICD-10-CM | POA: Insufficient documentation

## 2014-04-25 DIAGNOSIS — I2510 Atherosclerotic heart disease of native coronary artery without angina pectoris: Secondary | ICD-10-CM | POA: Insufficient documentation

## 2014-04-25 DIAGNOSIS — M85642 Other cyst of bone, left hand: Secondary | ICD-10-CM | POA: Diagnosis not present

## 2014-04-25 DIAGNOSIS — Z7902 Long term (current) use of antithrombotics/antiplatelets: Secondary | ICD-10-CM | POA: Insufficient documentation

## 2014-04-25 DIAGNOSIS — M25532 Pain in left wrist: Secondary | ICD-10-CM | POA: Diagnosis not present

## 2014-04-25 DIAGNOSIS — M25742 Osteophyte, left hand: Secondary | ICD-10-CM | POA: Diagnosis not present

## 2014-04-25 DIAGNOSIS — Z9861 Coronary angioplasty status: Secondary | ICD-10-CM | POA: Diagnosis not present

## 2014-04-25 DIAGNOSIS — Z9889 Other specified postprocedural states: Secondary | ICD-10-CM | POA: Insufficient documentation

## 2014-04-25 DIAGNOSIS — G4733 Obstructive sleep apnea (adult) (pediatric): Secondary | ICD-10-CM | POA: Insufficient documentation

## 2014-04-25 DIAGNOSIS — M19042 Primary osteoarthritis, left hand: Secondary | ICD-10-CM | POA: Diagnosis not present

## 2014-04-25 DIAGNOSIS — Z87891 Personal history of nicotine dependence: Secondary | ICD-10-CM | POA: Insufficient documentation

## 2014-04-25 HISTORY — DX: Cervicalgia: M54.2

## 2014-04-25 HISTORY — DX: Gout, unspecified: M10.9

## 2014-04-25 HISTORY — DX: Disorder of kidney and ureter, unspecified: N28.9

## 2014-04-25 HISTORY — DX: Myositis, unspecified: M60.9

## 2014-04-25 HISTORY — DX: Spinal stenosis, lumbar region without neurogenic claudication: M48.061

## 2014-04-25 HISTORY — DX: Unspecified kidney failure: N19

## 2014-04-25 HISTORY — DX: Other cervical disc degeneration, unspecified cervical region: M50.30

## 2014-04-25 LAB — CBC WITH DIFFERENTIAL/PLATELET
Basophils Absolute: 0 10*3/uL (ref 0.0–0.1)
Basophils Relative: 0 % (ref 0–1)
Eosinophils Absolute: 0.1 10*3/uL (ref 0.0–0.7)
Eosinophils Relative: 2 % (ref 0–5)
HCT: 40.1 % (ref 39.0–52.0)
Hemoglobin: 13.1 g/dL (ref 13.0–17.0)
Lymphocytes Relative: 9 % — ABNORMAL LOW (ref 12–46)
Lymphs Abs: 0.7 10*3/uL (ref 0.7–4.0)
MCH: 30.7 pg (ref 26.0–34.0)
MCHC: 32.7 g/dL (ref 30.0–36.0)
MCV: 93.9 fL (ref 78.0–100.0)
Monocytes Absolute: 0.4 10*3/uL (ref 0.1–1.0)
Monocytes Relative: 5 % (ref 3–12)
Neutro Abs: 6.5 10*3/uL (ref 1.7–7.7)
Neutrophils Relative %: 84 % — ABNORMAL HIGH (ref 43–77)
Platelets: 135 10*3/uL — ABNORMAL LOW (ref 150–400)
RBC: 4.27 MIL/uL (ref 4.22–5.81)
RDW: 15.4 % (ref 11.5–15.5)
WBC: 7.8 10*3/uL (ref 4.0–10.5)

## 2014-04-25 LAB — BASIC METABOLIC PANEL
Anion gap: 15 (ref 5–15)
BUN: 34 mg/dL — ABNORMAL HIGH (ref 6–23)
CO2: 21 mmol/L (ref 19–32)
Calcium: 9.3 mg/dL (ref 8.4–10.5)
Chloride: 103 mmol/L (ref 96–112)
Creatinine, Ser: 1.8 mg/dL — ABNORMAL HIGH (ref 0.50–1.35)
GFR calc Af Amer: 41 mL/min — ABNORMAL LOW (ref >90)
GFR calc non Af Amer: 35 mL/min — ABNORMAL LOW (ref >90)
Glucose, Bld: 158 mg/dL — ABNORMAL HIGH (ref 70–99)
Potassium: 3.9 mmol/L (ref 3.5–5.1)
Sodium: 139 mmol/L (ref 135–145)

## 2014-04-25 LAB — URIC ACID: Uric Acid, Serum: 5.2 mg/dL (ref 4.0–7.8)

## 2014-04-25 LAB — CK: Total CK: 71 U/L (ref 7–232)

## 2014-04-25 MED ORDER — PREDNISONE 20 MG PO TABS
60.0000 mg | ORAL_TABLET | Freq: Once | ORAL | Status: AC
  Administered 2014-04-25: 60 mg via ORAL
  Filled 2014-04-25: qty 3

## 2014-04-25 MED ORDER — MORPHINE SULFATE 4 MG/ML IJ SOLN
4.0000 mg | INTRAMUSCULAR | Status: AC | PRN
  Administered 2014-04-25 (×2): 4 mg via INTRAVENOUS
  Filled 2014-04-25 (×2): qty 1

## 2014-04-25 MED ORDER — OXYCODONE-ACETAMINOPHEN 5-325 MG PO TABS
2.0000 | ORAL_TABLET | Freq: Once | ORAL | Status: AC
  Administered 2014-04-25: 2 via ORAL
  Filled 2014-04-25: qty 2

## 2014-04-25 MED ORDER — ONDANSETRON HCL 4 MG/2ML IJ SOLN
4.0000 mg | INTRAMUSCULAR | Status: AC | PRN
  Administered 2014-04-25 (×2): 4 mg via INTRAVENOUS
  Filled 2014-04-25 (×3): qty 2

## 2014-04-25 MED ORDER — PREDNISONE 20 MG PO TABS: 40.0000 mg | ORAL_TABLET | Freq: Every day | ORAL | Status: DC

## 2014-04-25 MED ORDER — OXYCODONE-ACETAMINOPHEN 5-325 MG PO TABS: ORAL_TABLET | ORAL | Status: DC

## 2014-04-25 NOTE — Discharge Instructions (Signed)
°Emergency Department Resource Guide °1) Find a Doctor and Pay Out of Pocket °Although you won't have to find out who is covered by your insurance plan, it is a good idea to ask around and get recommendations. You will then need to call the office and see if the doctor you have chosen will accept you as a new patient and what types of options they offer for patients who are self-pay. Some doctors offer discounts or will set up payment plans for their patients who do not have insurance, but you will need to ask so you aren't surprised when you get to your appointment. ° °2) Contact Your Local Health Department °Not all health departments have doctors that can see patients for sick visits, but many do, so it is worth a call to see if yours does. If you don't know where your local health department is, you can check in your phone book. The CDC also has a tool to help you locate your state's health department, and many state websites also have listings of all of their local health departments. ° °3) Find a Walk-in Clinic °If your illness is not likely to be very severe or complicated, you may want to try a walk in clinic. These are popping up all over the country in pharmacies, drugstores, and shopping centers. They're usually staffed by nurse practitioners or physician assistants that have been trained to treat common illnesses and complaints. They're usually fairly quick and inexpensive. However, if you have serious medical issues or chronic medical problems, these are probably not your best option. ° °No Primary Care Doctor: °- Call [facility] at  832-8000 - they can help you locate a primary care doctor that  accepts your insurance, provides certain services, etc. °- Physician Referral Service- 1-800-533-3463 ° °Chronic Pain Problems: °Organization         Address  Phone   Notes  °[facility]  ([phone]) [phone] Patients need to be referred by their primary care doctor.  ° °Medication  Assistance: °Organization         Address  Phone   Notes  °[place] [facility] [address] °[place], [place] [address] (336) 641-8030 --Must be a resident of [place] °-- Must have NO insurance coverage whatsoever (no Medicaid/ Medicare, etc.) °-- The pt. MUST have a primary care doctor that directs their care regularly and follows them in the community °  °[facility]  (866) 331-1348   °[employer]  (888) 892-1162   ° °Agencies that provide inexpensive medical care: °Organization         Address  Phone   Notes  °[facility]  (336) 832-8035   °[facility]    (336) 832-7272   °[facility] [address] °[place], [place] [address] (336) 832-4777   °[facility] [address], °[place] (336) 271-4999   °[facility]    (336) 373-0678   °[facility]    (336) 272-1050   °[facility] ° [address], [place] Phone:  (336) 832-4444, Fax:  (336) 832-4440 Hours of Operation:  9 am - 6 pm, M-F.  Also accepts Medicaid/Medicare and self-pay.  °[facility] ° [address], [place] Phone: (336) [phone], Fax: (336) [phone]. Hours of Operation:  8:30 am - 5:30 pm, M-F.  Also accepts Medicaid and self-pay.  °[facility] [place] [address], [place] Phone: (336) 878-6027   °[facility] [address], [place], [place] (336)723-1848, Ext. 123 Mondays & Thursdays: 7-9 AM.  First 15 patients are seen on a first come, first serve basis. °  ° °Medicaid-accepting [place] Providers: ° °Organization         Address  Phone   Notes  °[facility] [address], [place] (336) 641-2100 Also accepts self-pay patients.  °[facility] [address], [place] ° (336) 856-9996   °[facility] [address], [place]  (336) 288-8857   °[facility] [address] [place] [address], [place] (336) 299-7000   °Veita Bland [address], [place]  ° (336) 373-1557 Only accepts [place] Access Medicaid patients after they have their name applied to their card.  ° °Self-Pay (no insurance) in [place]: ° °Organization         Address  Phone   Notes  °Sickle Cell Patients, [facility] [address], [place] (336) 832-1970   °[facility] [address], [place] (336) 832-4400   °[facility] ° [address], [place] (336) 992-4800   °[facility]/Dr. Osei-Bonsu ° [address] [place] [address], [place] or [address], [place] (336) [phone] Phone number for both [place] and [place] locations is the same.  °[facility] [address], [place] (336) 299-0000   °[facility] [address] [place] [address], [place] or [address] (336) 852-7530 °(336) 878-2260   °[facility] [address], [place] (336) 350-1642, phone; (336) 294-5005, fax Sees patients 1st and 3rd Saturday of every month.  Must not qualify for public or private insurance (i.e. Medicaid, Medicare, [place] Health Choice, Veterans' Benefits) • Household income should be no more than 200% of the poverty level •The clinic cannot treat you if you are pregnant or think you are pregnant • Sexually transmitted diseases are not treated at the clinic.  ° ° °Dental Care: °Organization         Address  Phone  Notes  °[place] [facility] [address], [place] (336) 641-6152 Accepts children up to age 21 who are enrolled in Medicaid or [place] Health Choice; pregnant women with a Medicaid card; and children who have applied for Medicaid or [place] Health Choice, but were declined, whose parents can pay a reduced fee at time of service.  °[place]  [facility] [place]  [address], [place] (336) 641-7733 Accepts children up to age 21 who are enrolled in Medicaid or [place] Health Choice; pregnant women with a Medicaid card; and children who have applied for Medicaid or [place] Health Choice, but were declined, whose parents can pay a reduced fee at time of service.  °[facility] ° [address], [place] (336) 641-4533 Patients are seen by appointment only. Walk-ins are not accepted. [facility] will see patients 18 years of age and older. °Monday - Tuesday (8am-5pm) °Most Wednesdays (8:30-5pm) °$30 per visit, cash only  °[facility] ° [address], [place] (336) 641-4533 Patients are seen by appointment only. Walk-ins are not accepted. [facility] will see patients 18 years of age and older. °One   Wednesday Evening (Monthly: Volunteer Based).  $30 per visit, cash only  °[facility]  (919) 537-3737 for adults; Children under age 4, call Graduate Pediatric Dentistry at ([phone]) [phone]. Children aged 4-14, please call ([phone]) [phone] to request a pediatric application. ° Dental services are provided in all areas of dental care including fillings, crowns and bridges, complete and partial dentures, implants, gum treatment, root canals, and extractions. Preventive care is also provided. Treatment is provided to both adults and children. °Patients are selected via a lottery and there is often a waiting list. °  °[facility] [address], °[place] ° (336) 763-8833 [email or web address].[email or web address] °  °[facility] [address], [place], [place] (336)723-1848, Ext. 123 Second and Fourth Thursday of each month, opens at 6:30 AM; Clinic ends at 9 AM.  Patients are seen on a first-come first-served basis, and a limited number are seen during each clinic.  ° °[facility] ° [address], [place], [place] (336) [phone]    Eligibility Requirements °You must have lived in [place], [place], or [place] counties for at least the last three months. °  You cannot be eligible for state or federal sponsored healthcare insurance, including Veterans Administration, Medicaid, or Medicare. °  You generally cannot be eligible for healthcare insurance through your employer.  °  How to apply: °Eligibility screenings are held every Tuesday and Wednesday afternoon from 1:00 pm until 4:00 pm. You do not need an appointment for the interview!  °[facility] [address], [place], [place] 336-631-2330   °[facility]  336-342-8273   °[facility]  336-703-3100   °[facility]  336-570-6415   ° °Behavioral Health Resources in the Community: °Intensive Outpatient Programs °Organization         Address  Phone  Notes  °[facility] [address], [place], [place] 336-878-6098   °[facility] [address], [place], [place] 336-832-9800   °[facility] [address], [place], [place] ° 336-882-2125   °[facility] [address],  °[place], [place] 1-800-853-5163 or 336-641-4981   °Substance Abuse Resources °Organization         Address  Phone  Notes  °[facility]  336-882-2125   °[facility]  336-784-9470   °[facility]  336-285-9073   °[facility]  336-845-3988   °Residential & Outpatient Substance Abuse Program  1-800-659-3381   °Psychological Services °Organization         Address  Phone  Notes  °[facility]  336- 832-9600   °[facility]  336- 378-7881   °[facility] [address], [place] 1-800-853-5163 or 336-641-4981   ° °Mobile Crisis Teams °Organization         Address  Phone  Notes  °[facility], Mobile Crisis Care Unit  1-877-626-1772   °Assertive °[facility] ° [address]  [place], [place] 336-834-9664   °Sharon DeEsch [address] °[place] 336-554-5454   ° °Self-Help/Support Groups °Organization         Address  Phone             Notes  °[facility] - variety of support groups  336- 373-1402 Call for more information  °Narcotics Anonymous (NA), [facility] [address], °[place]  2 meetings at this location  ° °  Residential Treatment Programs Organization         Address  Phone  Notes  [facility] [address],    [place]  1-503-173-8753   [facility]  [address] [place] [id number], [place], [place]   [place], [place] (762)[phone] Admissions: 8am-3pm M-F  [facility] [place] [address],    [place], [place] [phone]   [facility] [address] [place], [place], [place]   [facility] [address]  [place], [place]   [facility] Intensive Outpatient [address], [place], [place]   [facility]  [place], [place] 1-(623) 229-5098 or 603-868-6780   [facility] [address] [name], [place] Accepts Medicaid  [facility] [address],  [place] 1-972-546-9536 Substance Abuse/Addiction Treatment   [facility]         Address  Phone  Notes  [facility]  (863)758-9160   Domenic Schwab, PhD [address] [place], [place]   281-435-4200 or (518)211-3306   [facility] [place], [place] (740) 265-5186   [facility] [address], [place], [place] [phone] Insurance/Medicaid/sponsorship through [facility] [address] [place]                                    [place], [place] [phone]) [phone] [place] [address], [place] [phone]    Dr. Adele Schilder  [phone]   [facility] Dept. 1) [address] [place] 2) [place] 3)  [place] [address], [place] [phone] [phone]  [phone]   [place] [phone] or 641-042-6448 (After Hours)      Take the prescriptions as directed.  Apply moist heat or ice to the area(s) of discomfort, for 15 minutes at a time, several times per day for the next few days.  Do not fall asleep on a heating or ice pack. Wear the wrist splint for comfort, especially at night, until you are seen in follow up by the Orthopedist.  Call your Orthopedic doctor on Monday to schedule a follow up appointment in the next 3 days.  Return to the Emergency Department immediately if worsening.

## 2014-04-25 NOTE — ED Provider Notes (Signed)
CSN: [id number]     Arrival date & time 04/25/14  0730 History   First MD Initiated Contact with Patient 04/25/14 [phone]     Chief Complaint  Patient presents with  . Wrist Pain    left  . Hand Pain      HPI Pt was seen at 0755. Per pt, c/o gradual onset and worsening of persistent left hand and wrist "pain" for the past 3 to 4 weeks, worse since yesterday. Pain worsens with movement of his hand and wrist, as well as palpation of the area. Describes the pain as "sharp." Denies rash, no fevers, no injury, no neck pain, no CP/SOB, no focal motor weakness, no tingling/numbness in extremities.  Pt is right handed.    OrthoAntionette Char Past Medical History  Diagnosis Date  . Hypertension   . Coronary artery disease   . History of percutaneous left heart catheterization 04/2010  . Obesity   . OSA on CPAP   . Hx of echocardiogram 05/2009    EF 40-45%, he did have mild annular calcification with mild-to-moderate MR and mild TR as well as aortic valve sclerosis. Estimated RV systolic pressure was 21 mm.  . History of stress test 06/2011    No significant ischemia, this is a low risk scan. Clinical correlation recommended Abnormal myocardial perfusion study.  Marland Kitchen RBBB   . BPH (benign prostatic hyperplasia)   . Kidney failure   . Blood clot in vein 12/2013    Left leg  . 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 Gout   . Neck pain   . DDD (degenerative disc disease), cervical   . Spinal stenosis of lumbar region   . Renal insufficiency    Past Surgical History  Procedure Laterality Date  . Coronary angioplasty with stent placement  April 2012    LAD DES  . Coronary artery bypass graft  1992    with LIMA to the LAD and diagonal.  . Cardiac catheterization  2009    he was found to have a atretic LIMA graft to his LAD and had a patent vein graft supplying his diagonal vessel. At that time, he had 70% narrowing in his LAD beyond a diagonal vessel which was initially treated  medically.   Family History  Problem Relation Age of Onset  . Heart disease Father    History  Substance Use Topics  . Smoking status: Former Research scientist (life sciences)  . Smokeless tobacco: Never Used     Comment: quit ~ 34 years ago  . Alcohol Use: No    Review of Systems ROS: Statement: All systems negative except as marked or noted in the HPI; Constitutional: Negative for fever and chills. ; ; Eyes: Negative for eye pain, redness and discharge. ; ; ENMT: Negative for ear pain, hoarseness, nasal congestion, sinus pressure and sore throat. ; ; Cardiovascular: Negative for chest pain, palpitations, diaphoresis, dyspnea and peripheral edema. ; ; Respiratory: Negative for cough, wheezing and stridor. ; ; Gastrointestinal: Negative for nausea, vomiting, diarrhea, abdominal pain, blood in stool, hematemesis, jaundice and rectal bleeding. . ; ; Genitourinary: Negative for dysuria, flank pain and hematuria. ; ; Musculoskeletal: +left hand and wrist pain. Negative for back pain and neck pain. Negative for swelling and trauma.; ; Skin: Negative for pruritus, rash, abrasions, blisters, bruising and skin lesion.; ; Neuro: Negative for headache, lightheadedness and neck stiffness. Negative for weakness, altered level of consciousness , altered mental status, extremity weakness, paresthesias, involuntary movement, seizure and  syncope.     Allergies  Nitrostat  Home Medications   Prior to Admission medications   Medication Sig Start Date End Date Taking? Authorizing Provider  allopurinol (ZYLOPRIM) 300 MG tablet Take 300 mg by mouth daily.   Yes Historical Provider, MD  amLODipine (NORVASC) 5 MG tablet Take 5 mg by mouth daily.   Yes Historical Provider, MD  apixaban (ELIQUIS) 5 MG TABS tablet Take 1 tablet (5 mg total) by mouth 2 (two) times daily. 12/09/13  Yes Charlynne Cousins, MD  atorvastatin (LIPITOR) 10 MG tablet Take 10 mg by mouth daily.   Yes Historical Provider, MD  fenofibrate micronized (LOFIBRA) 134 MG  capsule Take 134 mg by mouth daily before breakfast.   Yes Historical Provider, MD  furosemide (LASIX) 40 MG tablet Take 40 mg by mouth daily.   Yes Historical Provider, MD  losartan (COZAAR) 100 MG tablet Take 100 mg by mouth daily.   Yes Historical Provider, MD  metoprolol succinate (TOPROL-XL) 50 MG 24 hr tablet Take 50 mg by mouth 2 (two) times daily. Take with or immediately following a meal.   Yes Historical Provider, MD  ranitidine (ZANTAC) 150 MG tablet Take 150 mg by mouth at bedtime.    Yes Historical Provider, MD  Tamsulosin HCl (FLOMAX) 0.4 MG CAPS Take 1 capsule (0.4 mg total) by mouth daily. 07/20/11  Yes Chauncy Passy, MD  traMADol (ULTRAM) 50 MG tablet Take 25-150 mg by mouth every 6 (six) hours as needed for moderate pain.   Yes Historical Provider, MD  clopidogrel (PLAVIX) 75 MG tablet TAKE 1 TABLET BY MOUTH DAILY Patient not taking: Reported on 04/25/2014 05/01/13   Troy Sine, MD  metoprolol (LOPRESSOR) 50 MG tablet Take 1 tablet (50 mg total) by mouth 2 (two) times daily. Patient not taking: Reported on 04/25/2014 10/13/13   Troy Sine, MD  oxyCODONE-acetaminophen (PERCOCET/ROXICET) 5-325 MG per tablet 1 or 2 tabs PO q6h prn pain 04/25/14   Francine Graven, DO  predniSONE (DELTASONE) 20 MG tablet Take 2 tablets (40 mg total) by mouth daily. Start 04/26/14 04/25/14   Francine Graven, DO   BP 127/56 mmHg  Pulse 67  Temp(Src) 97.6 F (36.4 C) (Oral)  Resp 12  Ht 5\' 6"  (1.676 m)  Wt 205 lb 1.6 oz (93.033 kg)  BMI 33.12 kg/m2  SpO2 98% Physical Exam  0800: Physical examination:  Nursing notes reviewed; Vital signs and O2 SAT reviewed;  Constitutional: Well developed, Well nourished, Well hydrated, In no acute distress; Head:  Normocephalic, atraumatic; Eyes: EOMI, PERRL, No scleral icterus; ENMT: Mouth and pharynx normal, Mucous membranes moist; Neck: Supple, Full range of motion, No lymphadenopathy; Cardiovascular: Regular rate and rhythm, No gallop; Respiratory: Breath  sounds clear & equal bilaterally, No rales, rhonchi, wheezes.  Speaking full sentences with ease, Normal respiratory effort/excursion; Chest: Nontender, Movement normal; Abdomen: Soft, Nontender, Nondistended, Normal bowel sounds; Genitourinary: No CVA tenderness; Spine:  No midline CS, TS, LS tenderness.;; Extremities: Pulses normal, NT left elbow/shoulder/fingers. +TTP left dorsal hand, entire wrist. +snuffbox tenderness. +pain to axial thumb and 3rd MCP loading.  Forearm compartments soft, strong radial pp, brisk cap refill in fingers. Left hand NMS intact with left hand having intact and equal sensation and strength in the distribution of the median, radial, and ulnar nerve function compared to opposite side. +Tinel's at left wrist. Decreased ROM F/E left wrist d/t c/o pain.  No rash. No deformity. No edema, No ecchymosis, no erythema. No calf edema or asymmetry.;  Neuro: AA&Ox3, Major CN grossly intact.  Speech clear. No gross focal motor or sensory deficits in extremities.; Skin: Color normal, Warm, Dry.   ED Course  Procedures     EKG Interpretation None      MDM  MDM Reviewed: previous chart, nursing note and vitals Reviewed previous: labs and MRI Interpretation: labs and x-ray      Results for orders placed or performed during the hospital encounter of 123456  Basic metabolic panel  Result Value Ref Range   Sodium 139 135 - 145 mmol/L   Potassium 3.9 3.5 - 5.1 mmol/L   Chloride 103 96 - 112 mmol/L   CO2 21 19 - 32 mmol/L   Glucose, Bld 158 (H) 70 - 99 mg/dL   BUN 34 (H) 6 - 23 mg/dL   Creatinine, Ser 1.80 (H) 0.50 - 1.35 mg/dL   Calcium 9.3 8.4 - 10.5 mg/dL   GFR calc non Af Amer 35 (L) >90 mL/min   GFR calc Af Amer 41 (L) >90 mL/min   Anion gap 15 5 - 15  CBC with Differential  Result Value Ref Range   WBC 7.8 4.0 - 10.5 K/uL   RBC 4.27 4.22 - 5.81 MIL/uL   Hemoglobin 13.1 13.0 - 17.0 g/dL   HCT 40.1 39.0 - 52.0 %   MCV 93.9 78.0 - 100.0 fL   MCH 30.7 26.0 - 34.0  pg   MCHC 32.7 30.0 - 36.0 g/dL   RDW 15.4 11.5 - 15.5 %   Platelets 135 (L) 150 - 400 K/uL   Neutrophils Relative % 84 (H) 43 - 77 %   Neutro Abs 6.5 1.7 - 7.7 K/uL   Lymphocytes Relative 9 (L) 12 - 46 %   Lymphs Abs 0.7 0.7 - 4.0 K/uL   Monocytes Relative 5 3 - 12 %   Monocytes Absolute 0.4 0.1 - 1.0 K/uL   Eosinophils Relative 2 0 - 5 %   Eosinophils Absolute 0.1 0.0 - 0.7 K/uL   Basophils Relative 0 0 - 1 %   Basophils Absolute 0.0 0.0 - 0.1 K/uL  CK  Result Value Ref Range   Total CK 71 7 - 232 U/L  Uric acid  Result Value Ref Range   Uric Acid, Serum 5.2 4.0 - 7.8 mg/dL   Dg Wrist Complete Left 04/25/2014   CLINICAL DATA:  Pt states left hand and left wrist pain x 1 month; pain became worse last night. Pt states pain is down the middle of posterior side of left hand and on posterior and anterior side of left wrist, swelling of entire left hand  EXAM: LEFT WRIST - COMPLETE 3+ VIEW  COMPARISON:  None.  FINDINGS: There is no evidence of fracture or dislocation. There is no evidence of arthropathy or other focal bone abnormality. Soft tissues are unremarkable.  IMPRESSION: Negative.   Electronically Signed   By: Lajean Manes M.D.   On: 04/25/2014 08:54   Dg Hand Complete Left 04/25/2014   CLINICAL DATA:  Pt states left hand and left wrist pain x 1 month; pain became worse last night. Pt states pain is down the middle of posterior side of left hand and on posterior and anterior side of left wrist, swelling of entire left hand  EXAM: LEFT HAND - COMPLETE 3+ VIEW  COMPARISON:  None.  FINDINGS: No fracture or dislocation.  Mild asymmetric joint space narrowing of the PIP and DIP joints of the middle and index fingers with small marginal osteophytes.  This is most prominent at the PIP joint of the middle finger. There are small subchondral cysts. No convincing erosion.  There is soft tissue prominence at the PIP joints of the index and middle fingers. No soft tissue ossification or calcification.   IMPRESSION: 1. No fracture or dislocation. 2. Arthropathic changes of the middle and index fingers most prominently involving the PIP joints. 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   Electronically Signed   By: Lajean Manes M.D.   On: 04/25/2014 08:56    Results for CAVELL, CANCEL (MRN [id number]) as of 04/25/2014 09:59  Ref. Range 12/03/2013 08:50 12/04/2013 07:32 12/05/2013 06:10 12/06/2013 02:35 12/08/2013 04:37 12/09/2013 04:45 04/25/2014 08:08  BUN Latest Ref Range: 6-23 mg/dL 44 (H) 46 (H) 46 (H) 49 (H) 39 (H) 36 (H) 34 (H)  Creatinine Latest Ref Range: 0.50-1.35 mg/dL 2.13 (H) 1.97 (H) 1.78 (H) 1.70 (H) 1.33 1.25 1.80 (H)    0940:  Platelets, BUN/Cr near baseline, per EPIC chart review. Workup otherwise reassuring. VS remain stable, afebrile. Left hand and wrist without edema, rash, ecchymosis. Strong radial pulse, muscles compartments soft. Doubt vascular source for pain, doubt septic joint, doubt compartment syndrome.  Will splint for comfort, tx prednisone (will avoid NSAID d/t renal insuff) and pain medication, f/u Hand MD. T/C to ID Dr. Drucilla Schmidt (given pt's hx of myositis), case discussed, including:  HPI, pertinent PM/SHx, VS/PE, dx testing, ED course and treatment:  Agrees with ED treatment plan, prednisone short burst only please. Dx and testing, as well as ID MD, d/w pt and family.  Questions answered.  Verb understanding, agreeable to d/c home with outpt f/u.       Francine Graven, DO 04/27/14 2114

## 2014-04-25 NOTE — ED Notes (Signed)
EMS - Patient coming from home with left arm pain that has been ongoing x 3 weeks that worsened last night.  Patient was given 145mcg of Fentanyl, pain now a 5/10./  Patient is tender to the touch from the fingers to mid forearm.

## 2014-04-25 NOTE — ED Notes (Signed)
Patient transported to X-ray 

## 2014-04-27 DIAGNOSIS — M109 Gout, unspecified: Secondary | ICD-10-CM | POA: Diagnosis not present

## 2014-04-30 DIAGNOSIS — E875 Hyperkalemia: Secondary | ICD-10-CM | POA: Diagnosis not present

## 2014-05-06 DIAGNOSIS — M109 Gout, unspecified: Secondary | ICD-10-CM | POA: Diagnosis not present

## 2014-05-07 DIAGNOSIS — G4733 Obstructive sleep apnea (adult) (pediatric): Secondary | ICD-10-CM | POA: Diagnosis not present

## 2014-05-07 DIAGNOSIS — M109 Gout, unspecified: Secondary | ICD-10-CM | POA: Diagnosis not present

## 2014-05-07 DIAGNOSIS — N401 Enlarged prostate with lower urinary tract symptoms: Secondary | ICD-10-CM | POA: Diagnosis not present

## 2014-06-01 DIAGNOSIS — Z7901 Long term (current) use of anticoagulants: Secondary | ICD-10-CM | POA: Diagnosis not present

## 2014-06-02 DIAGNOSIS — I82402 Acute embolism and thrombosis of unspecified deep veins of left lower extremity: Secondary | ICD-10-CM | POA: Diagnosis not present

## 2014-06-02 DIAGNOSIS — G459 Transient cerebral ischemic attack, unspecified: Secondary | ICD-10-CM | POA: Diagnosis not present

## 2014-06-06 DIAGNOSIS — G4733 Obstructive sleep apnea (adult) (pediatric): Secondary | ICD-10-CM | POA: Diagnosis not present

## 2014-06-22 DIAGNOSIS — N184 Chronic kidney disease, stage 4 (severe): Secondary | ICD-10-CM | POA: Diagnosis not present

## 2014-06-29 DIAGNOSIS — Z7982 Long term (current) use of aspirin: Secondary | ICD-10-CM | POA: Diagnosis not present

## 2014-06-29 DIAGNOSIS — I10 Essential (primary) hypertension: Secondary | ICD-10-CM | POA: Diagnosis not present

## 2014-06-29 DIAGNOSIS — Z7902 Long term (current) use of antithrombotics/antiplatelets: Secondary | ICD-10-CM | POA: Diagnosis not present

## 2014-06-29 DIAGNOSIS — E876 Hypokalemia: Secondary | ICD-10-CM | POA: Diagnosis not present

## 2014-06-29 DIAGNOSIS — Z125 Encounter for screening for malignant neoplasm of prostate: Secondary | ICD-10-CM | POA: Diagnosis not present

## 2014-07-03 DIAGNOSIS — N189 Chronic kidney disease, unspecified: Secondary | ICD-10-CM | POA: Diagnosis not present

## 2014-07-03 DIAGNOSIS — I2510 Atherosclerotic heart disease of native coronary artery without angina pectoris: Secondary | ICD-10-CM | POA: Diagnosis not present

## 2014-07-03 DIAGNOSIS — G459 Transient cerebral ischemic attack, unspecified: Secondary | ICD-10-CM | POA: Diagnosis not present

## 2014-07-03 DIAGNOSIS — Z0000 Encounter for general adult medical examination without abnormal findings: Secondary | ICD-10-CM | POA: Diagnosis not present

## 2014-07-07 DIAGNOSIS — G4733 Obstructive sleep apnea (adult) (pediatric): Secondary | ICD-10-CM | POA: Diagnosis not present

## 2014-08-06 DIAGNOSIS — G4733 Obstructive sleep apnea (adult) (pediatric): Secondary | ICD-10-CM | POA: Diagnosis not present

## 2014-08-17 DIAGNOSIS — G4733 Obstructive sleep apnea (adult) (pediatric): Secondary | ICD-10-CM | POA: Diagnosis not present

## 2014-08-24 DIAGNOSIS — N184 Chronic kidney disease, stage 4 (severe): Secondary | ICD-10-CM | POA: Diagnosis not present

## 2014-09-06 DIAGNOSIS — G4733 Obstructive sleep apnea (adult) (pediatric): Secondary | ICD-10-CM | POA: Diagnosis not present

## 2014-09-30 DIAGNOSIS — N179 Acute kidney failure, unspecified: Secondary | ICD-10-CM | POA: Diagnosis not present

## 2014-09-30 DIAGNOSIS — N184 Chronic kidney disease, stage 4 (severe): Secondary | ICD-10-CM | POA: Diagnosis not present

## 2014-09-30 DIAGNOSIS — M6009 Infective myositis, multiple sites: Secondary | ICD-10-CM | POA: Diagnosis not present

## 2014-09-30 DIAGNOSIS — I10 Essential (primary) hypertension: Secondary | ICD-10-CM | POA: Diagnosis not present

## 2014-09-30 DIAGNOSIS — Z23 Encounter for immunization: Secondary | ICD-10-CM | POA: Diagnosis not present

## 2014-10-01 ENCOUNTER — Telehealth: Payer: Self-pay | Admitting: Cardiovascular Disease

## 2014-10-01 NOTE — Telephone Encounter (Signed)
Received records from [facility] for appointment with Dr Claiborne Billings on 11/06/14.  Records given to [facility] (medical records) for Dr Evette Georges schedule on 11/06/14.  lp

## 2014-10-09 DIAGNOSIS — N401 Enlarged prostate with lower urinary tract symptoms: Secondary | ICD-10-CM | POA: Diagnosis not present

## 2014-10-09 DIAGNOSIS — N480 Leukoplakia of penis: Secondary | ICD-10-CM | POA: Diagnosis not present

## 2014-10-09 DIAGNOSIS — N359 Urethral stricture, unspecified: Secondary | ICD-10-CM | POA: Diagnosis not present

## 2014-10-09 DIAGNOSIS — R339 Retention of urine, unspecified: Secondary | ICD-10-CM | POA: Diagnosis not present

## 2014-10-09 DIAGNOSIS — R350 Frequency of micturition: Secondary | ICD-10-CM | POA: Diagnosis not present

## 2014-10-23 DIAGNOSIS — I10 Essential (primary) hypertension: Secondary | ICD-10-CM | POA: Diagnosis not present

## 2014-11-06 ENCOUNTER — Ambulatory Visit (INDEPENDENT_AMBULATORY_CARE_PROVIDER_SITE_OTHER): Payer: Medicare Other | Admitting: Cardiovascular Disease

## 2014-11-06 ENCOUNTER — Encounter: Payer: Self-pay | Admitting: Cardiovascular Disease

## 2014-11-06 VITALS — BP 132/60 | HR 65 | Ht 66.0 in | Wt 218.3 lb

## 2014-11-06 DIAGNOSIS — I4510 Unspecified right bundle-branch block: Secondary | ICD-10-CM

## 2014-11-06 DIAGNOSIS — Z86718 Personal history of other venous thrombosis and embolism: Secondary | ICD-10-CM

## 2014-11-06 DIAGNOSIS — I10 Essential (primary) hypertension: Secondary | ICD-10-CM

## 2014-11-06 DIAGNOSIS — I2510 Atherosclerotic heart disease of native coronary artery without angina pectoris: Secondary | ICD-10-CM | POA: Diagnosis not present

## 2014-11-06 DIAGNOSIS — E785 Hyperlipidemia, unspecified: Secondary | ICD-10-CM

## 2014-11-06 DIAGNOSIS — Z79899 Other long term (current) drug therapy: Secondary | ICD-10-CM | POA: Diagnosis not present

## 2014-11-06 DIAGNOSIS — I2584 Coronary atherosclerosis due to calcified coronary lesion: Secondary | ICD-10-CM | POA: Diagnosis not present

## 2014-11-06 DIAGNOSIS — N289 Disorder of kidney and ureter, unspecified: Secondary | ICD-10-CM

## 2014-11-06 DIAGNOSIS — R601 Generalized edema: Secondary | ICD-10-CM | POA: Diagnosis not present

## 2014-11-06 NOTE — Progress Notes (Signed)
Patient ID: Paul Price, male   DOB: 10-Nov-1940, 74 y.o.   MRN: [id number]      HPI: Paul Price, is a 74 y.o. male who presents for a 21 month cardiology followup evaluation   Paul Price has established CAD and in 1992 underwent CABG revascularization surgery LIMA to the LAD and diagonal. In 2009 he was found to have an atretic LIMA graft to the LAD and a patent vein graft supplying the diagonal vessel. He had 70% narrowing in the LAD beyond the diagonal was treated medically. In April 2012 due to progressive chest pain and scintigraphic evidence for ischemia in the mid distal LAD territory repeat catheterization was performed and he underwent successful stenting to his LAD beyond the diagonal with a 3.0x20 mm Promus DES stent postdilated 3.25 mm. Subsequent, he has remained active and has done well. His last stress test in 2013 showed an apical defect without ischemia.  Additional problems include obstructive sleep apnea on CPAP therapy, moderate obesity, hypertension, and hyperlipidemia. He denies recent chest pain. He sees Dr. Shelia Media for primary care.  On November 19, 2012 he saw Kerin Ransom.PAC for suspected atrial fibrillation and CHF after having been seen by his primary physician and noted some increasing shortness of breath with activity. A CardioNet monitor  demonstrated  a 4.3 second pause which led to a reduction in his  beta blocker dose  from Lopressor 75 mg twice a day to 25 twice a day. He saw Tarri Fuller on 12/02/2012 at which time he was in sinus rhythm but also was found to have probably 9 beat run of nonsustained VT on monitor which is he was maintained on low-dose beta blocker therapy. Subsequent cardiac monitoring did not show any further episodes of significant bradycardia but he did have one additional 9 beat run of nonsustained PSVT at a rate of 109 beats per minute on 12/17/12. . Laboratory done by Dr. Shelia Media on 11/27/2012 showed a potassium of 4.2. His creatinine was 1.82.  BUN 33.  An echo Doppler study done 12/09/2012 showed an estimated ejection fraction at 50-55%. Although no diagnostic regional wall motion abnormalities were definitively identified, this possibility was not completely excluded. There was grade 2 diastolic dysfunction. There is mild left atrial dilatation and mild mitral regurgitation.  A renal ultrasound has  demonstrate bilateral small renal cysts.    Since I last saw him in June 2015.  He states  was found to have a DVT in his left leg and was started on eliquis by Dr. Shelia Media.  He was hospitalized on November 23 through 12/09/2013 and presented with fevers, and sudden onset of severe back pain.  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  Blood cultures were negative.   After 1 week in the hospital, he spent 3 weeks at rehabilitation.  He was treated with 4 weeks of antibiotic therapy and after his hospitalization.  He was followed by Dr. Michel Bickers from infectious disease.  He last saw him in March 2016 and att that time he was felt to be completely cleared cured.    He has renal insufficiency and has been followed by Dr. Gillermina Phy since he had developed acute on chronic kidney injury.  His serum creatinine peaked at 2.8.  Ultimately, this has improved.  He last saw Dr. Gillermina Phy in September 2016.    Paul Price denies recent chest pain.  He continues to note tense edema in the right lower extremity in the leg that had  the previous DVT.  He is no longer anticoagulation but has been on Plavix and aspirin.  His blood pressure has been treated with Toprol-XL 50 mg losartan 100 mg furosemide 40 mg and amlodipine 10 mg.  He is on lipid lowering therapy with atorvastatin.  He takes allopurinol for gout.  There is history of GERD treated with ranitidine.  He presents for evaluation.   Past Medical History  Diagnosis Date  . Hypertension   . Coronary artery disease   . History of percutaneous left heart catheterization 04/2010  .  Obesity   . OSA on CPAP   . Hx of echocardiogram 05/2009    EF 40-45%, he did have mild annular calcification with mild-to-moderate MR and mild TR as well as aortic valve sclerosis. Estimated RV systolic pressure was 21 mm.  . History of stress test 06/2011    No significant ischemia, this is a low risk scan. Clinical correlation recommended Abnormal myocardial perfusion study.  Marland Kitchen RBBB   . BPH (benign prostatic hyperplasia)   . Kidney failure   . Blood clot in vein 12/2013    Left leg  . 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 Gout   . Neck pain   . DDD (degenerative disc disease), cervical   . Spinal stenosis of lumbar region   . Renal insufficiency     Past Surgical History  Procedure Laterality Date  . Coronary angioplasty with stent placement  April 2012    LAD DES  . Coronary artery bypass graft  1992    with LIMA to the LAD and diagonal.  . Cardiac catheterization  2009    he was found to have a atretic LIMA graft to his LAD and had a patent vein graft supplying his diagonal vessel. At that time, he had 70% narrowing in his LAD beyond a diagonal vessel which was initially treated medically.    Allergies  Allergen Reactions  . Nitrostat [Nitroglycerin]     Causes blood pressure to "bottom out"    Current Outpatient Prescriptions  Medication Sig Dispense Refill  . allopurinol (ZYLOPRIM) 300 MG tablet Take 300 mg by mouth daily.    Marland Kitchen amLODipine (NORVASC) 10 MG tablet Take 1 tablet by mouth daily.    Marland Kitchen aspirin 81 MG tablet Take 81 mg by mouth daily.    Marland Kitchen atorvastatin (LIPITOR) 10 MG tablet Take 10 mg by mouth daily.    . clopidogrel (PLAVIX) 75 MG tablet TAKE 1 TABLET BY MOUTH DAILY 30 tablet 9  . fenofibrate micronized (LOFIBRA) 134 MG capsule Take 134 mg by mouth daily before breakfast.    . furosemide (LASIX) 40 MG tablet Take 40 mg by mouth daily.    Marland Kitchen losartan (COZAAR) 100 MG tablet Take 100 mg by mouth daily.    . metoprolol (LOPRESSOR) 50 MG tablet Take 1  tablet (50 mg total) by mouth 2 (two) times daily. 45 tablet 6  . metoprolol succinate (TOPROL-XL) 50 MG 24 hr tablet Take 50 mg by mouth 2 (two) times daily. Take with or immediately following a meal.    . ranitidine (ZANTAC) 150 MG tablet Take 150 mg by mouth at bedtime.     . Tamsulosin HCl (FLOMAX) 0.4 MG CAPS Take 1 capsule (0.4 mg total) by mouth daily. 30 capsule 0  . traMADol (ULTRAM) 50 MG tablet Take 25-150 mg by mouth every 6 (six) hours as needed for moderate pain.     No current facility-administered medications for  this visit.    Social History   Social History  . Marital Status: Married    Spouse Name: N/A  . Number of Children: N/A  . Years of Education: N/A   Occupational History  . Not on file.   Social History Main Topics  . Smoking status: Former Research scientist (life sciences)  . Smokeless tobacco: Never Used     Comment: quit ~ 34 years ago  . Alcohol Use: No  . Drug Use: No  . Sexual Activity: Not on file   Other Topics Concern  . Not on file   Social History Narrative    Socially he is married, has 3 children 7 grandchildren. He does not use tobacco or alcohol.  ROS General: Negative; No fevers, chills, or night sweats;  HEENT: Negative; No changes in vision or hearing, sinus congestion, difficulty swallowing Pulmonary: Negative; No cough, wheezing, shortness of breath, hemoptysis Cardiovascular: Negative; No chest pain, presyncope, syncope, palpatations GI: Negative; No nausea, vomiting, diarrhea, or abdominal pain GU: Negative; No dysuria, hematuria, or difficulty voiding Musculoskeletal: History of paraspinal myositis  Hematologic/Oncology: Negative; no easy bruising, bleeding Endocrine: Negative; no heat/cold intolerance; no diabetes Neuro: Occasional paresthesias; no changes in balance, headaches Skin: Negative; No rashes or skin lesions Psychiatric: Negative; No behavioral problems, depression Sleep: Positive for sleep apnea on CPAP; No snoring, daytime  sleepiness, hypersomnolence, bruxism, restless legs, hypnogognic hallucinations, no cataplexy Other comprehensive 14 point system review is negative.   PE BP 132/60 mmHg  Pulse 65  Ht 5' 6"  (1.676 m)  Wt 218 lb 4.8 oz (99.02 kg)  BMI 35.25 kg/m2  Repeat blood pressure by me was 148/78 General: Alert, oriented, no distress.  Skin: normal turgor, no rashes HEENT: Normocephalic, atraumatic. Pupils round and reactive; sclera anicteric;no lid lag.  Nose without nasal septal hypertrophy Mouth/Parynx benign; Mallinpatti scale 3 Neck: Thick neck; No JVD, no carotid bruits with normal carotid upstroke  Lungs: clear to ausculatation and percussion; no wheezing or rales Chest wall: No tenderness to palpation Heart: RRR, s1 s2 normal 1/6 systolic murmur; no S3 gallop.  No diastolic murmur, rubs, thrills or heaves. Abdomen: Moderate central adiposity soft, nontender; no hepatosplenomehaly, BS+; abdominal aorta nontender and not dilated by palpation. Moderate diastases recti. Back: No CVA tenderness Pulses 2+ Extremities: Tense left lower extremity with tense edema., Homan's sign negative  Neurologic: grossly nonfocal Psychological: Normal affect and mood  ECG (independently read by me): Normal sinus rhythm at 65 bpm.  Right bundle branch block.  First degree AV block.  June 2015 ECG (independently read by me): Normal sinus rhythm at 61 beats per minute.  Right bundle branch block.  Mild voltage criteria for LVH in aVL  ECG (independently read by me): Sinus rhythm at 60 beats per minute. Right bundle branch block; PAC, inferolateral T abnormalities.  LABS: BMP Latest Ref Rng 04/25/2014 12/09/2013 12/08/2013  Glucose 70 - 99 mg/dL 158(H) 90 102(H)  BUN 6 - 23 mg/dL 34(H) 36(H) 39(H)  Creatinine 0.50 - 1.35 mg/dL 1.80(H) 1.25 1.33  Sodium 135 - 145 mmol/L 139 140 141  Potassium 3.5 - 5.1 mmol/L 3.9 4.0 4.2  Chloride 96 - 112 mmol/L 103 103 105  CO2 19 - 32 mmol/L 21 24 23   Calcium 8.4 - 10.5  mg/dL 9.3 9.2 8.9   Hepatic Function Latest Ref Rng 07/14/2013  Total Protein 6.0 - 8.3 g/dL 7.1  Albumin 3.5 - 5.2 g/dL 4.0  AST 0 - 37 U/L 27  ALT 0 - 53 U/L 24  Alk Phosphatase 39 - 117 U/L 59  Total Bilirubin 0.2 - 1.2 mg/dL 0.4   CBC Latest Ref Rng 04/25/2014 12/08/2013 12/07/2013  WBC 4.0 - 10.5 K/uL 7.8 7.0 7.0  Hemoglobin 13.0 - 17.0 g/dL 13.1 10.3(L) 10.4(L)  Hematocrit 39.0 - 52.0 % 40.1 32.0(L) 32.0(L)  Platelets 150 - 400 K/uL 135(L) 272 248   Lab Results  Component Value Date   MCV 93.9 04/25/2014   MCV 91.2 12/08/2013   MCV 90.7 12/07/2013   No results found for: TSH No results found for: HGBA1C   Lipid Panel  No results found for: CHOL, TRIG, HDL, CHOLHDL, VLDL, LDLCALC, LDLDIRECT   BNP No results found for: PROBNP  Lipid Panel  No results found for: CHOL   RADIOLOGY: No results found.    ASSESSMENT AND PLAN: Paul Price isa 74 year old Caucasian male who is  82 yars status post his CABG revascularization surgery and 4 years since a stent was placed in the LAD beyond this diagonal vessel. He has a documented atretic LIMA graft and a patent vein graft which supplies this diagonal graft.   His echo Doppler study  showed an ejection fraction of 50-55% and probable grade 2 diastolic dysfunction. He did have mild left atrial dilatation and mild mitral regurgitation.  [facility] seen him, he had developed a DVT in the left leg, for which he was treated with eloquence for some time, but ultimately apparently was changed to aspirin and Plavix.  He was hospitalized with fever and severe back pain and on MRI imaging was found to have paraspinal myositis.  He required 4 weeks of antibiotic therapy.  I reviewed his hospital records as well as the records from Dr. Michel Bickers.  He is felt to be completely  healed.  Presently he denies any recurrent anginal symptoms.  He is followed by the renal service for renal insufficiency.  He continues to have tense lower extremity left  edema.  I am suggesting a follow-up venous Doppler study to make certain there is no residual thrombus, particular since he is not on oral anticoagulation.  I have recommended he take an extra dose of Lasix 20 g in the afternoon every other day or as needed.  In addition to his morning dose of 40 mg to improve diuresis.  Complete set of laboratory will be obtained in the fasting state.  He continues to use CPAP for his obstructive sleep apnea.  His GERD is controlled on ranitidine.  He is on lipid-lowering therapy including atorvastatin and fenofibrate therapy.  I'll contact him regarding the results of the above studies.  I will see him in 4 months for reevaluation.  Time spent: 25 min  Troy Sine, MD, [facility]  11/06/2014 3:36 PM

## 2014-11-06 NOTE — Patient Instructions (Addendum)
Your physician has recommended you make the following change in your medication: You can take extra 20 mg (1/2) tablet of furosemide every other day if needed for swelling.  Your physician recommends that you return for lab work fasting.  Your physician has requested that you have a lower extremity venous  duplex. During this test,  ultrasound are used to evaluate venous blood flow in the legs.  There are no restrictions or special instructions.   Your physician recommends that you schedule a follow-up appointment in: 4 months with dr. Claiborne Billings  If you need a refill on your cardiac medications before your next appointment, please call your pharmacy.

## 2014-11-18 ENCOUNTER — Other Ambulatory Visit: Payer: Self-pay | Admitting: Cardiovascular Disease

## 2014-11-18 ENCOUNTER — Ambulatory Visit (HOSPITAL_COMMUNITY)
Admission: RE | Admit: 2014-11-18 | Discharge: 2014-11-18 | Disposition: A | Discharge status: Home / Self Care | Payer: Medicare Other | Source: Ambulatory Visit | Attending: Cardiovascular Disease | Admitting: Cardiovascular Disease

## 2014-11-18 DIAGNOSIS — I10 Essential (primary) hypertension: Secondary | ICD-10-CM | POA: Diagnosis not present

## 2014-11-18 DIAGNOSIS — E785 Hyperlipidemia, unspecified: Secondary | ICD-10-CM | POA: Diagnosis not present

## 2014-11-18 DIAGNOSIS — Z86718 Personal history of other venous thrombosis and embolism: Secondary | ICD-10-CM

## 2014-11-18 DIAGNOSIS — R601 Generalized edema: Secondary | ICD-10-CM

## 2014-11-18 DIAGNOSIS — Z79899 Other long term (current) drug therapy: Secondary | ICD-10-CM | POA: Diagnosis not present

## 2014-11-18 DIAGNOSIS — I2584 Coronary atherosclerosis due to calcified coronary lesion: Secondary | ICD-10-CM | POA: Diagnosis not present

## 2014-11-18 DIAGNOSIS — I2510 Atherosclerotic heart disease of native coronary artery without angina pectoris: Secondary | ICD-10-CM | POA: Diagnosis not present

## 2014-11-18 LAB — CBC
HCT: 43.3 % (ref 39.0–52.0)
Hemoglobin: 14 g/dL (ref 13.0–17.0)
MCH: 29.9 pg (ref 26.0–34.0)
MCHC: 32.3 g/dL (ref 30.0–36.0)
MCV: 92.3 fL (ref 78.0–100.0)
MPV: 11.8 fL (ref 8.6–12.4)
Platelets: 186 10*3/uL (ref 150–400)
RBC: 4.69 MIL/uL (ref 4.22–5.81)
RDW: 15.9 % — ABNORMAL HIGH (ref 11.5–15.5)
WBC: 4.9 10*3/uL (ref 4.0–10.5)

## 2014-11-19 LAB — COMPREHENSIVE METABOLIC PANEL
ALT: 20 U/L (ref 9–46)
AST: 25 U/L (ref 10–35)
Albumin: 4.2 g/dL (ref 3.6–5.1)
Alkaline Phosphatase: 90 U/L (ref 40–115)
BUN: 33 mg/dL — ABNORMAL HIGH (ref 7–25)
CO2: 24 mmol/L (ref 20–31)
Calcium: 9.5 mg/dL (ref 8.6–10.3)
Chloride: 105 mmol/L (ref 98–110)
Creat: 1.8 mg/dL — ABNORMAL HIGH (ref 0.70–1.18)
Glucose, Bld: 114 mg/dL — ABNORMAL HIGH (ref 65–99)
Potassium: 4.4 mmol/L (ref 3.5–5.3)
Sodium: 142 mmol/L (ref 135–146)
Total Bilirubin: 0.6 mg/dL (ref 0.2–1.2)
Total Protein: 7 g/dL (ref 6.1–8.1)

## 2014-11-19 LAB — LIPID PANEL
Cholesterol: 124 mg/dL — ABNORMAL LOW (ref 125–200)
HDL: 26 mg/dL — ABNORMAL LOW (ref >=40)
LDL Cholesterol: 58 mg/dL (ref <130)
Total CHOL/HDL Ratio: 4.8 Ratio (ref <=5.0)
Triglycerides: 201 mg/dL — ABNORMAL HIGH (ref <150)
VLDL: 40 mg/dL — ABNORMAL HIGH (ref <30)

## 2014-11-19 LAB — TSH: TSH: 3.625 u[IU]/mL (ref 0.350–4.500)

## 2015-03-09 DIAGNOSIS — G4733 Obstructive sleep apnea (adult) (pediatric): Secondary | ICD-10-CM | POA: Diagnosis not present

## 2015-03-15 ENCOUNTER — Ambulatory Visit (INDEPENDENT_AMBULATORY_CARE_PROVIDER_SITE_OTHER): Payer: Medicare Other | Admitting: Cardiovascular Disease

## 2015-03-15 ENCOUNTER — Encounter: Payer: Self-pay | Admitting: Cardiovascular Disease

## 2015-03-15 VITALS — BP 130/62 | HR 64 | Ht 66.0 in | Wt 218.0 lb

## 2015-03-15 DIAGNOSIS — I480 Paroxysmal atrial fibrillation: Secondary | ICD-10-CM

## 2015-03-15 DIAGNOSIS — I4510 Unspecified right bundle-branch block: Secondary | ICD-10-CM

## 2015-03-15 DIAGNOSIS — I2510 Atherosclerotic heart disease of native coronary artery without angina pectoris: Secondary | ICD-10-CM

## 2015-03-15 DIAGNOSIS — Z9989 Dependence on other enabling machines and devices: Secondary | ICD-10-CM

## 2015-03-15 DIAGNOSIS — G4733 Obstructive sleep apnea (adult) (pediatric): Secondary | ICD-10-CM | POA: Diagnosis not present

## 2015-03-15 DIAGNOSIS — I2583 Coronary atherosclerosis due to lipid rich plaque: Secondary | ICD-10-CM

## 2015-03-15 DIAGNOSIS — E785 Hyperlipidemia, unspecified: Secondary | ICD-10-CM

## 2015-03-15 DIAGNOSIS — I10 Essential (primary) hypertension: Secondary | ICD-10-CM | POA: Diagnosis not present

## 2015-03-15 DIAGNOSIS — E669 Obesity, unspecified: Secondary | ICD-10-CM | POA: Diagnosis not present

## 2015-03-15 NOTE — Patient Instructions (Signed)
Your physician wants you to follow-up in: 6 months or sooner if needed. You will receive a reminder letter in the mail two months in advance. If you don't receive a letter, please call our office to schedule the follow-up appointment.  If you need a refill on your cardiac medications before your next appointment, please call your pharmacy.  Please have Dr Shelia Media order a CPAP download or call me with your provider information and I will order.

## 2015-03-15 NOTE — Progress Notes (Signed)
Patient ID: Paul Price, male   DOB: 1940-03-20, 75 y.o.   MRN: [id number]     Primary MD:  Dr. [name]  HPI: Paul Price,is a 75 y.o. male who presents for a 6 month cardiology followup evaluation   Paul Price has established CAD and in 1992 underwent CABG revascularization surgery LIMA to the LAD and diagonal. In 2009 he was found to have an atretic LIMA graft to the LAD and a patent vein graft supplying the diagonal vessel. He had 70% narrowing in the LAD beyond the diagonal was treated medically. In April 2012 due to progressive chest pain and scintigraphic evidence for ischemia in the mid distal LAD territory repeat catheterization was performed and he underwent successful stenting to his LAD beyond the diagonal with a 3.0x20 mm Promus DES stent postdilated 3.25 mm. Subsequent, he has remained active and has done well. His last stress test in 2013 showed an apical defect without ischemia.  Additional problems include obstructive sleep apnea on CPAP therapy, moderate obesity, hypertension, history of DVT, and hyperlipidemia.  He sees Dr. Shelia Media for primary care.  On November 19, 2012 he saw Kerin Ransom.PAC for suspected atrial fibrillation and CHF after having been seen by his primary physician and noted some increasing shortness of breath with activity. A CardioNet monitor  demonstrated  a 4.3 second pause which led to a reduction in his  beta blocker dose  from Lopressor 75 mg twice a day to 25 twice a day. He saw Tarri Fuller on 12/02/2012 at which time he was in sinus rhythm but also was found to have probably 9 beat run of nonsustained VT on monitor which is he was maintained on low-dose beta blocker therapy. Subsequent cardiac monitoring did not show any further episodes of significant bradycardia but he did have one additional 9 beat run of nonsustained PSVT at a rate of 109 beats per minute on 12/17/12. . Laboratory done by Dr. Shelia Media on 11/27/2012 showed a potassium of 4.2. His  creatinine was 1.82. BUN 33.  An echo Doppler study done 12/09/2012 showed an estimated ejection fraction at 50-55%. Although no diagnostic regional wall motion abnormalities were definitively identified, this possibility was not completely excluded. There was grade 2 diastolic dysfunction. There is mild left atrial dilatation and mild mitral regurgitation.  A renal ultrasound has  demonstrate bilateral small renal cysts.    In 2015 he was found to have a DVT in his left leg and was started on eliquis by Dr. Shelia Media.  He was hospitalized on November 23 through 12/09/2013 and presented with fevers, and sudden onset of severe back pain.  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  Blood cultures were negative.   After 1 week in the hospital, he spent 3 weeks at rehabilitation.  He was treated with 4 weeks of antibiotic therapy and after his hospitalization.  He was followed by Dr. Michel Bickers from infectious disease.  He last saw him in March 2016 and att that time he was felt to be completely cleared cured.    He has renal insufficiency and has been followed by Dr. Gillermina Phy since he had developed acute on chronic kidney injury.  His serum creatinine peaked at 2.8.  Ultimately, this has improved.  He last saw Dr. Gillermina Phy in September 2016.    When I last saw him, he continued to note lower extremity edema. He was no longer anticoagulation but has been on Plavix and aspirin.  I scheduled him  for follow-up lower extremity venous ultrasound which showed resolution of his left distal femoral vein and popliteal vein thrombus . His blood pressure has been treated with Toprol-XL 50 mg losartan 100 mg furosemide 40 mg and amlodipine 10 mg.  He is on lipid lowering therapy with atorvastatin.  He takes allopurinol for gout.  There is history of GERD treated with ranitidine.    He admits to using his CPAP with 100% compliance.  He states he has a new machine.  However, recently he has been  noticing frequent awakenings.  He denies awareness of breakthrough snoring.  He cannot recall his last download.  He presents for evaluation.  Past Medical History  Diagnosis Date  . Hypertension   . Coronary artery disease   . History of percutaneous left heart catheterization 04/2010  . Obesity   . OSA on CPAP   . Hx of echocardiogram 05/2009    EF 40-45%, he did have mild annular calcification with mild-to-moderate MR and mild TR as well as aortic valve sclerosis. Estimated RV systolic pressure was 21 mm.  . History of stress test 06/2011    No significant ischemia, this is a low risk scan. Clinical correlation recommended Abnormal myocardial perfusion study.  Marland Kitchen RBBB   . BPH (benign prostatic hyperplasia)   . Kidney failure   . Blood clot in vein 12/2013    Left leg  . 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 Gout   . Neck pain   . DDD (degenerative disc disease), cervical   . Spinal stenosis of lumbar region   . Renal insufficiency     Past Surgical History  Procedure Laterality Date  . Coronary angioplasty with stent placement  April 2012    LAD DES  . Coronary artery bypass graft  1992    with LIMA to the LAD and diagonal.  . Cardiac catheterization  2009    he was found to have a atretic LIMA graft to his LAD and had a patent vein graft supplying his diagonal vessel. At that time, he had 70% narrowing in his LAD beyond a diagonal vessel which was initially treated medically.    Allergies  Allergen Reactions  . Nitrostat [Nitroglycerin]     Causes blood pressure to "bottom out"    Current Outpatient Prescriptions  Medication Sig Dispense Refill  . allopurinol (ZYLOPRIM) 300 MG tablet Take 300 mg by mouth daily.    Marland Kitchen amLODipine (NORVASC) 10 MG tablet Take 1 tablet by mouth daily.    Marland Kitchen aspirin 81 MG tablet Take 81 mg by mouth daily.    Marland Kitchen atorvastatin (LIPITOR) 10 MG tablet Take 10 mg by mouth daily.    . clopidogrel (PLAVIX) 75 MG tablet TAKE 1 TABLET BY  MOUTH DAILY 30 tablet 9  . fenofibrate micronized (LOFIBRA) 134 MG capsule Take 134 mg by mouth daily before breakfast.    . furosemide (LASIX) 40 MG tablet Take 40 mg by mouth daily.    Marland Kitchen losartan (COZAAR) 100 MG tablet Take 100 mg by mouth daily.    . metoprolol succinate (TOPROL-XL) 50 MG 24 hr tablet Take 50 mg by mouth 2 (two) times daily. Take with or immediately following a meal.    . ranitidine (ZANTAC) 150 MG tablet Take 150 mg by mouth at bedtime.     . Tamsulosin HCl (FLOMAX) 0.4 MG CAPS Take 1 capsule (0.4 mg total) by mouth daily. 30 capsule 0   No current facility-administered medications for  this visit.    Social History   Social History  . Marital Status: Married    Spouse Name: N/A  . Number of Children: N/A  . Years of Education: N/A   Occupational History  . Not on file.   Social History Main Topics  . Smoking status: Former Research scientist (life sciences)  . Smokeless tobacco: Never Used     Comment: quit ~ 34 years ago  . Alcohol Use: No  . Drug Use: No  . Sexual Activity: Not on file   Other Topics Concern  . Not on file   Social History Narrative    Socially he is married, has 3 children 7 grandchildren. He does not use tobacco or alcohol.  ROS General: Negative; No fevers, chills, or night sweats;  HEENT: Negative; No changes in vision or hearing, sinus congestion, difficulty swallowing Pulmonary: Negative; No cough, wheezing, shortness of breath, hemoptysis Cardiovascular: Negative; No chest pain, presyncope, syncope, palpatations GI: Negative; No nausea, vomiting, diarrhea, or abdominal pain GU: Negative; No dysuria, hematuria, or difficulty voiding Musculoskeletal: History of paraspinal myositis  Hematologic/Oncology: Negative; no easy bruising, bleeding Endocrine: Negative; no heat/cold intolerance; no diabetes Neuro: Occasional paresthesias; no changes in balance, headaches Skin: Negative; No rashes or skin lesions Psychiatric: Negative; No behavioral problems,  depression Sleep: Positive for sleep apnea on CPAP; No snoring, daytime sleepiness, hypersomnolence, bruxism, restless legs, hypnogognic hallucinations, no cataplexy Other comprehensive 14 point system review is negative.   PE BP 130/62 mmHg  Pulse 64  Ht 5' 6"  (1.676 m)  Wt 218 lb (98.884 kg)  BMI 35.20 kg/m2   Wt Readings from Last 3 Encounters:  03/15/15 218 lb (98.884 kg)  11/06/14 218 lb 4.8 oz (99.02 kg)  04/25/14 205 lb 1.6 oz (93.033 kg)   General: Alert, oriented, no distress.  Skin: normal turgor, no rashes HEENT: Normocephalic, atraumatic. Pupils round and reactive; sclera anicteric;no lid lag.  Nose without nasal septal hypertrophy Mouth/Parynx benign; Mallinpatti scale 3 Neck: Thick neck; No JVD, no carotid bruits with normal carotid upstroke  Lungs: clear to ausculatation and percussion; no wheezing or rales Chest wall: No tenderness to palpation Heart: RRR, s1 s2 normal 1/6 systolic murmur; no S3 gallop.  No diastolic murmur, rubs, thrills or heaves. Abdomen: Moderate central adiposity soft, nontender; no hepatosplenomehaly, BS+; abdominal aorta nontender and not dilated by palpation. Moderate diastases recti. Back: No CVA tenderness Pulses 2+ Extremities: Improvement and prior tense edema, but still with trace to 1+ pitting edema, left greater than right.., Homan's sign negative  Neurologic: grossly nonfocal Psychological: Normal affect and mood  ECG (independently read by me): Normal sinus rhythm at 61 bpm.  Right bundle branch block with repolarization changes.  QTc interval 477 ms.  PR interval 204 ms.  October 2016 ECG (independently read by me): Normal sinus rhythm at 65 bpm.  Right bundle branch block.  First degree AV block.  June 2015 ECG (independently read by me): Normal sinus rhythm at 61 beats per minute.  Right bundle branch block.  Mild voltage criteria for LVH in aVL  ECG (independently read by me): Sinus rhythm at 60 beats per minute. Right  bundle branch block; PAC, inferolateral T abnormalities.  LABS: BMP Latest Ref Rng 11/18/2014 04/25/2014 12/09/2013  Glucose 65 - 99 mg/dL 114(H) 158(H) 90  BUN 7 - 25 mg/dL 33(H) 34(H) 36(H)  Creatinine 0.70 - 1.18 mg/dL 1.80(H) 1.80(H) 1.25  Sodium 135 - 146 mmol/L 142 139 140  Potassium 3.5 - 5.3 mmol/L 4.4 3.9 4.0  Chloride 98 - 110 mmol/L 105 103 103  CO2 20 - 31 mmol/L 24 21 24   Calcium 8.6 - 10.3 mg/dL 9.5 9.3 9.2   Hepatic Function Latest Ref Rng 11/18/2014 07/14/2013  Total Protein 6.1 - 8.1 g/dL 7.0 7.1  Albumin 3.6 - 5.1 g/dL 4.2 4.0  AST 10 - 35 U/L 25 27  ALT 9 - 46 U/L 20 24  Alk Phosphatase 40 - 115 U/L 90 59  Total Bilirubin 0.2 - 1.2 mg/dL 0.6 0.4   CBC Latest Ref Rng 11/18/2014 04/25/2014 12/08/2013  WBC 4.0 - 10.5 K/uL 4.9 7.8 7.0  Hemoglobin 13.0 - 17.0 g/dL 14.0 13.1 10.3(L)  Hematocrit 39.0 - 52.0 % 43.3 40.1 32.0(L)  Platelets 150 - 400 K/uL 186 135(L) 272   Lab Results  Component Value Date   MCV 92.3 11/18/2014   MCV 93.9 04/25/2014   MCV 91.2 12/08/2013   Lab Results  Component Value Date   TSH 3.625 11/18/2014   No results found for: HGBA1C   Lipid Panel     Component Value Date/Time   CHOL 124* 11/18/2014 0839   TRIG 201* 11/18/2014 0839   HDL 26* 11/18/2014 0839   CHOLHDL 4.8 11/18/2014 0839   VLDL 40* 11/18/2014 0839   LDLCALC 58 11/18/2014 0839     BNP No results found for: PROBNP  Lipid Panel     Component Value Date/Time   CHOL 124* 11/18/2014 0839     RADIOLOGY: No results found.    ASSESSMENT AND PLAN: Paul Price is a 75 year old Caucasian male who is  25 years status post CABG revascularization surgery and 5 years since a stent was placed in the LAD beyond this diagonal vessel. He has a documented atretic LIMA graft and a patent vein graft which supplies this diagonal graft.   His echo Doppler study  showed an ejection fraction of 50-55% and probable grade 2 diastolic dysfunction. He did have mild left atrial dilatation  and mild mitral regurgitation.  His blood pressure today is stable on his current regimen consisting of Toprol-XL 100 mg, losartan 100 mg, and amlodipine 10 mg.  His recent lipid studies in November 2016 menstruated an LDL of 58, total cholesterol 124, but his triglycerides were elevated at 201 and his HDL remains low at 26.  He has continued to be on fenofibrate and atorvastatin 10 mg daily.  Discussed importance of further improvement in diet, with particular reference to carbohydrates and sweets.  He has renal insufficiency and recently, his creatinines have been in the 1.8 range.  He admits to using CPAP 100% of the time.  He has had difficulty with sleep maintenance and often notes frequent awakenings in the middle of the night.  He is unaware of breakthrough snoring.  I have suggested that a download be obtained on his current unit to make certain undergoing appropriate therapy.  He is not having any palpitations.  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  He will be following up with Dr. Shelia Media for his primary care.  I will see him in 6 months for reevaluation.   Time spent: 25 min  Troy Sine, MD, [facility]  03/15/2015 9:01 AM

## 2015-05-28 DIAGNOSIS — N184 Chronic kidney disease, stage 4 (severe): Secondary | ICD-10-CM | POA: Diagnosis not present

## 2015-05-28 DIAGNOSIS — N179 Acute kidney failure, unspecified: Secondary | ICD-10-CM | POA: Diagnosis not present

## 2015-05-28 DIAGNOSIS — M6009 Infective myositis, multiple sites: Secondary | ICD-10-CM | POA: Diagnosis not present

## 2015-05-28 DIAGNOSIS — G4733 Obstructive sleep apnea (adult) (pediatric): Secondary | ICD-10-CM | POA: Diagnosis not present

## 2015-05-28 DIAGNOSIS — I10 Essential (primary) hypertension: Secondary | ICD-10-CM | POA: Diagnosis not present

## 2015-06-22 DIAGNOSIS — G4733 Obstructive sleep apnea (adult) (pediatric): Secondary | ICD-10-CM | POA: Diagnosis not present

## 2015-07-12 DIAGNOSIS — H6121 Impacted cerumen, right ear: Secondary | ICD-10-CM | POA: Diagnosis not present

## 2015-07-12 DIAGNOSIS — M5430 Sciatica, unspecified side: Secondary | ICD-10-CM | POA: Diagnosis not present

## 2015-07-12 DIAGNOSIS — M7989 Other specified soft tissue disorders: Secondary | ICD-10-CM | POA: Diagnosis not present

## 2015-07-12 DIAGNOSIS — Z7901 Long term (current) use of anticoagulants: Secondary | ICD-10-CM | POA: Diagnosis not present

## 2015-07-12 DIAGNOSIS — Z0000 Encounter for general adult medical examination without abnormal findings: Secondary | ICD-10-CM | POA: Diagnosis not present

## 2015-07-28 ENCOUNTER — Ambulatory Visit: Payer: Medicare Other | Admitting: Physical Therapy

## 2015-07-29 ENCOUNTER — Telehealth: Payer: Self-pay

## 2015-07-29 NOTE — Telephone Encounter (Signed)
7/19/ pt cxl eval will be out of town 2-3 wks and will call back

## 2015-09-07 DIAGNOSIS — D509 Iron deficiency anemia, unspecified: Secondary | ICD-10-CM | POA: Diagnosis not present

## 2015-09-07 DIAGNOSIS — N184 Chronic kidney disease, stage 4 (severe): Secondary | ICD-10-CM | POA: Diagnosis not present

## 2015-10-06 ENCOUNTER — Encounter: Payer: Self-pay | Admitting: Cardiovascular Disease

## 2015-10-06 ENCOUNTER — Ambulatory Visit (INDEPENDENT_AMBULATORY_CARE_PROVIDER_SITE_OTHER): Payer: Medicare Other | Admitting: Cardiovascular Disease

## 2015-10-06 VITALS — BP 110/60 | HR 60 | Ht 66.0 in | Wt 219.0 lb

## 2015-10-06 DIAGNOSIS — I2510 Atherosclerotic heart disease of native coronary artery without angina pectoris: Secondary | ICD-10-CM

## 2015-10-06 DIAGNOSIS — Z79899 Other long term (current) drug therapy: Secondary | ICD-10-CM

## 2015-10-06 DIAGNOSIS — Z86718 Personal history of other venous thrombosis and embolism: Secondary | ICD-10-CM

## 2015-10-06 DIAGNOSIS — E785 Hyperlipidemia, unspecified: Secondary | ICD-10-CM

## 2015-10-06 DIAGNOSIS — G4733 Obstructive sleep apnea (adult) (pediatric): Secondary | ICD-10-CM

## 2015-10-06 DIAGNOSIS — I4510 Unspecified right bundle-branch block: Secondary | ICD-10-CM

## 2015-10-06 DIAGNOSIS — I10 Essential (primary) hypertension: Secondary | ICD-10-CM

## 2015-10-06 DIAGNOSIS — E669 Obesity, unspecified: Secondary | ICD-10-CM

## 2015-10-06 NOTE — Patient Instructions (Signed)
Medication Instructions:  Continue current medications  Labwork: Fasting lipids, CMP, TSH, CBC  Testing/Procedures: Your physician has requested that you have a lexiscan myoview in 6 Months. For further information please visit [email or web address]. Please follow instruction sheet, as given.  Follow-Up: Your physician wants you to follow-up in: 6 Months. You will receive a reminder letter in the mail two months in advance. If you don't receive a letter, please call our office to schedule the follow-up appointment.   Any Other Special Instructions Will Be Listed Below (If Applicable).   If you need a refill on your cardiac medications before your next appointment, please call your pharmacy.

## 2015-10-07 NOTE — Progress Notes (Signed)
Patient ID: Paul Price, male   DOB: January 09, 1941, 75 y.o.   MRN: [id number]     Primary MD:  Dr. [name]  HPI: Paul Price is a 75 y.o. male who presents for a 6 month cardiology followup evaluation   Paul Price has established CAD and in 1992 underwent CABG revascularization surgery LIMA to the LAD and diagonal. In 2009 he was found to have an atretic LIMA graft to the LAD and a patent vein graft supplying the diagonal vessel. He had 70% narrowing in the LAD beyond the diagonal was treated medically. In April 2012 due to progressive chest pain and scintigraphic evidence for ischemia in the mid distal LAD territory repeat catheterization was performed and he underwent successful stenting to his LAD beyond the diagonal with a 3.0x20 mm Promus DES stent postdilated 3.25 mm. Subsequent, he has remained active and has done well. His last stress test in 2013 showed an apical defect without ischemia.  Additional problems include obstructive sleep apnea on CPAP therapy, moderate obesity, hypertension, history of DVT, and hyperlipidemia.  He sees Dr. Shelia Media for primary care.  On November 19, 2012 he saw Kerin Ransom.PAC for suspected atrial fibrillation and CHF after having been seen by his primary physician and noted some increasing shortness of breath with activity. A CardioNet monitor  demonstrated  a 4.3 second pause which led to a reduction in his  beta blocker dose  from Lopressor 75 mg twice a day to 25 twice a day. He saw Tarri Fuller on 12/02/2012 at which time he was in sinus rhythm but also was found to have probably 9 beat run of nonsustained VT on monitor which is he was maintained on low-dose beta blocker therapy. Subsequent cardiac monitoring did not show any further episodes of significant bradycardia but he did have one additional 9 beat run of nonsustained PSVT at a rate of 109 beats per minute on 12/17/12. . Laboratory done by Dr. Shelia Media on 11/27/2012 showed a potassium of 4.2. His  creatinine was 1.82. BUN 33.  An echo Doppler study done 12/09/2012 showed an estimated ejection fraction at 50-55%. Although no diagnostic regional wall motion abnormalities were definitively identified, this possibility was not completely excluded. There was grade 2 diastolic dysfunction. There is mild left atrial dilatation and mild mitral regurgitation.  A renal ultrasound has  demonstrate bilateral small renal cysts.    In 2015 he was found to have a DVT in his left leg and was started on eliquis by Dr. Shelia Media.  He was hospitalized on November 23 through 12/09/2013 and presented with fevers, and sudden onset of severe back pain.  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  Blood cultures were negative.   After 1 week in the hospital, he spent 3 weeks at rehabilitation.  He was treated with 4 weeks of antibiotic therapy and after his hospitalization.  He was followed by Dr. Michel Bickers from infectious disease.  He last saw him in March 2016 and att that time he was felt to be completely cleared cured.    He has renal insufficiency and has been followed by Dr. Gillermina Phy since he had developed acute on chronic kidney injury.  His serum creatinine peaked at 2.8.  Ultimately, this has improved.  He last saw Dr. Gillermina Phy in September 2016.    When I  saw him last year, he continued to note lower extremity edema. He was no longer anticoagulation but has been on Plavix and aspirin.  I scheduled him for follow-up lower extremity venous ultrasound which showed resolution of his left distal femoral vein and popliteal vein thrombus . His blood pressure has been treated with Toprol-XL 50 mg losartan 100 mg furosemide 40 mg and amlodipine 10 mg.  He is on lipid lowering therapy with atorvastatin.  He takes allopurinol for gout.  There is history of GERD treated with ranitidine.    He admits to using his CPAP with 100% compliance.  He states he has a new machine.  However, recently he has been  noticing frequent awakenings.  He denies awareness of breakthrough snoring.  He cannot recall his last download.  He presents for evaluation.  Presently, he denies any chest pain.  He denies any palpitations.  He admits to mild shortness of breath when walking fast.  He presents for reevaluation.  Past Medical History:  Diagnosis Date  . Blood clot in vein 12/2013   Left leg  . BPH (benign prostatic hyperplasia)   . Coronary artery disease   . DDD (degenerative disc disease), cervical   . Gout   . History of percutaneous left heart catheterization 04/2010  . History of stress test 06/2011   No significant ischemia, this is a low risk scan. Clinical correlation recommended Abnormal myocardial perfusion study.  Marland Kitchen Hx of echocardiogram 05/2009   EF 40-45%, he did have mild annular calcification with mild-to-moderate MR and mild TR as well as aortic valve sclerosis. Estimated RV systolic pressure was 21 mm.  . Hypertension   . Kidney failure   . 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 Neck pain   . Obesity   . OSA on CPAP   . RBBB   . Renal insufficiency   . Spinal stenosis of lumbar region     Past Surgical History:  Procedure Laterality Date  . CARDIAC CATHETERIZATION  2009   he was found to have a atretic LIMA graft to his LAD and had a patent vein graft supplying his diagonal vessel. At that time, he had 70% narrowing in his LAD beyond a diagonal vessel which was initially treated medically.  . CORONARY ANGIOPLASTY WITH STENT PLACEMENT  April 2012   LAD DES  . CORONARY ARTERY BYPASS GRAFT  1992   with LIMA to the LAD and diagonal.    Allergies  Allergen Reactions  . Nitrostat [Nitroglycerin] Other (See Comments)    Causes blood pressure to "bottom out"    Current Outpatient Prescriptions  Medication Sig Dispense Refill  . allopurinol (ZYLOPRIM) 300 MG tablet Take 300 mg by mouth daily.    Marland Kitchen amLODipine (NORVASC) 10 MG tablet Take 1 tablet by mouth daily.    Marland Kitchen  aspirin 81 MG tablet Take 81 mg by mouth daily.    Marland Kitchen atorvastatin (LIPITOR) 10 MG tablet Take 10 mg by mouth daily.    . clopidogrel (PLAVIX) 75 MG tablet TAKE 1 TABLET BY MOUTH DAILY 30 tablet 9  . fenofibrate micronized (LOFIBRA) 134 MG capsule Take 134 mg by mouth daily before breakfast.    . furosemide (LASIX) 40 MG tablet Take 40 mg by mouth daily.    Marland Kitchen losartan (COZAAR) 100 MG tablet Take 100 mg by mouth daily.    . metoprolol succinate (TOPROL-XL) 50 MG 24 hr tablet Take 50 mg by mouth 2 (two) times daily. Take with or immediately following a meal.    . ranitidine (ZANTAC) 150 MG tablet Take 150 mg by mouth at bedtime.     Marland Kitchen  Tamsulosin HCl (FLOMAX) 0.4 MG CAPS Take 1 capsule (0.4 mg total) by mouth daily. 30 capsule 0  . traMADol (ULTRAM) 50 MG tablet Take 1 tablet by mouth as needed.     No current facility-administered medications for this visit.     Social History   Social History  . Marital status: Married    Spouse name: N/A  . Number of children: N/A  . Years of education: N/A   Occupational History  . Not on file.   Social History Main Topics  . Smoking status: Former Research scientist (life sciences)  . Smokeless tobacco: Never Used     Comment: quit ~ 34 years ago  . Alcohol use No  . Drug use: No  . Sexual activity: Not on file   Other Topics Concern  . Not on file   Social History Narrative  . No narrative on file    Socially he is married, has 3 children 7 grandchildren. He does not use tobacco or alcohol.  ROS General: Negative; No fevers, chills, or night sweats; Moderate obesity HEENT: Negative; No changes in vision or hearing, sinus congestion, difficulty swallowing Pulmonary: Negative; No cough, wheezing, shortness of breath, hemoptysis Cardiovascular: Negative; No chest pain, presyncope, syncope, palpatations GI: Negative; No nausea, vomiting, diarrhea, or abdominal pain GU: Negative; No dysuria, hematuria, or difficulty voiding Musculoskeletal: History of paraspinal  myositis  Hematologic/Oncology: Negative; no easy bruising, bleeding Endocrine: Negative; no heat/cold intolerance; no diabetes Neuro: Occasional paresthesias; no changes in balance, headaches Skin: Negative; No rashes or skin lesions Psychiatric: Negative; No behavioral problems, depression Sleep: Positive for sleep apnea on CPAP; No snoring, daytime sleepiness, hypersomnolence, bruxism, restless legs, hypnogognic hallucinations, no cataplexy Other comprehensive 14 point system review is negative.   PE BP 110/60 (BP Location: Right Arm)   Pulse 60   Ht 5' 6" (1.676 m)   Wt 219 lb (99.3 kg)   SpO2 97%   BMI 35.35 kg/m    Repeat blood pressure by me 130/70.  Wt Readings from Last 3 Encounters:  10/06/15 219 lb (99.3 kg)  03/15/15 218 lb (98.9 kg)  11/06/14 218 lb 4.8 oz (99 kg)   General: Alert, oriented, no distress.  Skin: normal turgor, no rashes HEENT: Normocephalic, atraumatic. Pupils round and reactive; sclera anicteric;no lid lag.  Nose without nasal septal hypertrophy Mouth/Parynx benign; Mallinpatti scale 3 Neck: Thick neck; No JVD, no carotid bruits with normal carotid upstroke  Lungs: clear to ausculatation and percussion; no wheezing or rales Chest wall: No tenderness to palpation Heart: RRR, s1 s2 normal 1/6 systolic murmur; no S3 gallop.  No diastolic murmur, rubs, thrills or heaves. Abdomen: Moderate central adiposity soft, nontender; no hepatosplenomehaly, BS+; abdominal aorta nontender and not dilated by palpation. Moderate diastases recti. Back: No CVA tenderness Pulses 2+ Extremities: Improvement in prior tense edema with trace pitting edema, left greater than right., Homan's sign negative  Neurologic: grossly nonfocal Psychological: Normal affect and mood  ECG (independently read by me): Normal sinus rhythm with mild sinus arrhythmia.  First degree AV block with a PR interval at 212 ms.  Right bundle-branch block.  March 2017 ECG (independently read by  me): Normal sinus rhythm at 61 bpm.  Right bundle branch block with repolarization changes.  QTc interval 477 ms.  PR interval 204 ms.  October 2016 ECG (independently read by me): Normal sinus rhythm at 65 bpm.  Right bundle branch block.  First degree AV block.  June 2015 ECG (independently read by me): Normal sinus rhythm  at 61 beats per minute.  Right bundle branch block.  Mild voltage criteria for LVH in aVL  ECG (independently read by me): Sinus rhythm at 60 beats per minute. Right bundle branch block; PAC, inferolateral T abnormalities.  LABS: BMP Latest Ref Rng & Units 11/18/2014 04/25/2014 12/09/2013  Glucose 65 - 99 mg/dL 114(H) 158(H) 90  BUN 7 - 25 mg/dL 33(H) 34(H) 36(H)  Creatinine 0.70 - 1.18 mg/dL 1.80(H) 1.80(H) 1.25  Sodium 135 - 146 mmol/L 142 139 140  Potassium 3.5 - 5.3 mmol/L 4.4 3.9 4.0  Chloride 98 - 110 mmol/L 105 103 103  CO2 20 - 31 mmol/L _0 Calcium 8.6 - 10.3 mg/dL 9.5 9.3 9.2   Hepatic Function Latest Ref Rng & Units 11/18/2014 07/14/2013  Total Protein 6.1 - 8.1 g/dL 7.0 7.1  Albumin 3.6 - 5.1 g/dL 4.2 4.0  AST 10 - 35 U/L 25 27  ALT 9 - 46 U/L 20 24  Alk Phosphatase 40 - 115 U/L 90 59  Total Bilirubin 0.2 - 1.2 mg/dL 0.6 0.4   CBC Latest Ref Rng & Units 11/18/2014 04/25/2014 12/08/2013  WBC 4.0 - 10.5 K/uL 4.9 7.8 7.0  Hemoglobin 13.0 - 17.0 g/dL 14.0 13.1 10.3(L)  Hematocrit 39.0 - 52.0 % 43.3 40.1 32.0(L)  Platelets 150 - 400 K/uL 186 135(L) 272   Lab Results  Component Value Date   MCV 92.3 11/18/2014   MCV 93.9 04/25/2014   MCV 91.2 12/08/2013   Lab Results  Component Value Date   TSH 3.625 11/18/2014   No results found for: HGBA1C   Lipid Panel     Component Value Date/Time   CHOL 124 (L) 11/18/2014 0839   TRIG 201 (H) 11/18/2014 0839   HDL 26 (L) 11/18/2014 0839   CHOLHDL 4.8 11/18/2014 0839   VLDL 40 (H) 11/18/2014 0839   LDLCALC 58 11/18/2014 0839     BNP No results found for: PROBNP  Lipid Panel     Component Value  Date/Time   CHOL 124 (L) 11/18/2014 0839     RADIOLOGY: No results found.    ASSESSMENT AND PLAN: [name] is a 75 year old Caucasian male who is  25 years status post CABG revascularization surgery and 5 years since a stent was placed in the LAD beyond this diagonal vessel. He has a documented atretic LIMA graft and a patent vein graft which supplies this diagonal graft.   His echo Doppler study showed an ejection fraction of 50-55% and probable grade 2 diastolic dysfunction. He did have mild left atrial dilatation and mild mitral regurgitation.  His blood pressure today is controlled on his current regimen consisting of Toprol-XL 100 mg, losartan 100 mg, and amlodipine 10 mg.  His  lipid studies in November 2016 showed an LDL of 58, total cholesterol 124, but his triglycerides were elevated at 201 and his HDL remains low at 26.  He has continued to be on fenofibrate and atorvastatin 10 mg daily.  I again discussed the importance of further improvement in diet, with particular reference to carbohydrates and sweets.  He has stage IV chronic kidney disease and is followed by Dr. Azzie Price.  He has obstructive sleep apnea and is using CPAP 100% of the time.  He has had difficulty with sleep maintenance and often notes frequent awakenings in the middle of the night.  He is unaware of breakthrough snoring.  He has first-degree AV block, which is stable.  I have recommended follow-up fasting blood work  be obtained.  In 6 months, I am scheduling him for a five-year follow-up Lexiscan Myoview study since his last evaluation in 2013.  I will see him in the office in follow-up and further recommendations will be made at that time.  Time spent: 25 minutes  Troy Sine, MD, [facility]  10/07/2015 9:27 PM

## 2015-10-14 DIAGNOSIS — G4733 Obstructive sleep apnea (adult) (pediatric): Secondary | ICD-10-CM | POA: Diagnosis not present

## 2015-10-22 DIAGNOSIS — Z23 Encounter for immunization: Secondary | ICD-10-CM | POA: Diagnosis not present

## 2015-10-27 DIAGNOSIS — E785 Hyperlipidemia, unspecified: Secondary | ICD-10-CM | POA: Diagnosis not present

## 2015-10-27 DIAGNOSIS — Z79899 Other long term (current) drug therapy: Secondary | ICD-10-CM | POA: Diagnosis not present

## 2015-10-27 LAB — CBC
HCT: 42.4 % (ref 38.5–50.0)
Hemoglobin: 13.7 g/dL (ref 13.2–17.1)
MCH: 29.1 pg (ref 27.0–33.0)
MCHC: 32.3 g/dL (ref 32.0–36.0)
MCV: 90 fL (ref 80.0–100.0)
MPV: 11.4 fL (ref 7.5–12.5)
Platelets: 167 10*3/uL (ref 140–400)
RBC: 4.71 MIL/uL (ref 4.20–5.80)
RDW: 16.2 % — ABNORMAL HIGH (ref 11.0–15.0)
WBC: 5.4 10*3/uL (ref 3.8–10.8)

## 2015-10-28 LAB — COMPLETE METABOLIC PANEL WITH GFR
ALT: 21 U/L (ref 9–46)
AST: 24 U/L (ref 10–35)
Albumin: 4.2 g/dL (ref 3.6–5.1)
Alkaline Phosphatase: 97 U/L (ref 40–115)
BUN: 25 mg/dL (ref 7–25)
CO2: 27 mmol/L (ref 20–31)
Calcium: 9.7 mg/dL (ref 8.6–10.3)
Chloride: 106 mmol/L (ref 98–110)
Creat: 1.79 mg/dL — ABNORMAL HIGH (ref 0.70–1.18)
GFR, Est African American: 41 mL/min — ABNORMAL LOW (ref >=60)
GFR, Est Non African American: 36 mL/min — ABNORMAL LOW (ref >=60)
Glucose, Bld: 117 mg/dL — ABNORMAL HIGH (ref 65–99)
Potassium: 4.9 mmol/L (ref 3.5–5.3)
Sodium: 144 mmol/L (ref 135–146)
Total Bilirubin: 0.5 mg/dL (ref 0.2–1.2)
Total Protein: 6.9 g/dL (ref 6.1–8.1)

## 2015-10-28 LAB — LIPID PANEL
Cholesterol: 141 mg/dL (ref 125–200)
HDL: 28 mg/dL — ABNORMAL LOW (ref >=40)
LDL Cholesterol: 75 mg/dL (ref <130)
Total CHOL/HDL Ratio: 5 Ratio (ref <=5.0)
Triglycerides: 192 mg/dL — ABNORMAL HIGH (ref <150)
VLDL: 38 mg/dL — ABNORMAL HIGH (ref <30)

## 2015-10-28 LAB — TSH: TSH: 4.07 mIU/L (ref 0.40–4.50)

## 2015-11-01 DIAGNOSIS — D509 Iron deficiency anemia, unspecified: Secondary | ICD-10-CM | POA: Diagnosis not present

## 2015-11-01 DIAGNOSIS — N184 Chronic kidney disease, stage 4 (severe): Secondary | ICD-10-CM | POA: Diagnosis not present

## 2015-11-05 DIAGNOSIS — I2510 Atherosclerotic heart disease of native coronary artery without angina pectoris: Secondary | ICD-10-CM | POA: Diagnosis not present

## 2015-11-05 DIAGNOSIS — M6009 Infective myositis, multiple sites: Secondary | ICD-10-CM | POA: Diagnosis not present

## 2015-11-05 DIAGNOSIS — I10 Essential (primary) hypertension: Secondary | ICD-10-CM | POA: Diagnosis not present

## 2015-11-05 DIAGNOSIS — N183 Chronic kidney disease, stage 3 (moderate): Secondary | ICD-10-CM | POA: Diagnosis not present

## 2015-11-05 DIAGNOSIS — N179 Acute kidney failure, unspecified: Secondary | ICD-10-CM | POA: Diagnosis not present

## 2015-12-16 DIAGNOSIS — N401 Enlarged prostate with lower urinary tract symptoms: Secondary | ICD-10-CM | POA: Diagnosis not present

## 2015-12-16 DIAGNOSIS — R350 Frequency of micturition: Secondary | ICD-10-CM | POA: Diagnosis not present

## 2016-03-06 DIAGNOSIS — N184 Chronic kidney disease, stage 4 (severe): Secondary | ICD-10-CM | POA: Diagnosis not present

## 2016-03-06 DIAGNOSIS — D509 Iron deficiency anemia, unspecified: Secondary | ICD-10-CM | POA: Diagnosis not present

## 2016-04-12 ENCOUNTER — Telehealth: Payer: Self-pay | Admitting: Cardiovascular Disease

## 2016-04-12 NOTE — Telephone Encounter (Signed)
Discussed w patient. Ongoing SOB x "months", identifies this as gradually worse. Mainly exertive.  States he's also had some intermittent shoulder discomfort and did not know if this was cardiac related. He denies other concerns.  He was offered APP visit on 4/12 and was fine to f/u then. I offered to check for sooner appt -- he was agreeable to see Dr. Claiborne Billings on 4/6 instead. Made this arrangement. Pt aware to call if further concerns. He's aware that if he has onset of chest pain or sudden increase of SOB to go to ED to evaluate.

## 2016-04-12 NOTE — Telephone Encounter (Signed)
Follow Up   Pt returning phone call from [facility]

## 2016-04-12 NOTE — Telephone Encounter (Signed)
Returned call, no answer when dialed.  I see that appt was made for 4/12 for his complaint. Left msg to call back if needing to discuss.

## 2016-04-12 NOTE — Telephone Encounter (Signed)
New Message     Pt c/o Shortness Of Breath: STAT if SOB developed within the last 24 hours or pt is noticeably SOB on the phone  1. Are you currently SOB (can you hear that pt is SOB on the phone)? no 2. How long have you been experiencing SOB? A couple months  3. Are you SOB when sitting or when up moving around? Both   4. Are you currently experiencing any other symptoms?  No , bp is running 120/60 O2 level is 97 heart rate is 58-66

## 2016-04-12 NOTE — Telephone Encounter (Signed)
Routed to Dr. Claiborne Billings as Juluis Rainier.

## 2016-04-13 NOTE — Telephone Encounter (Signed)
ok 

## 2016-04-14 ENCOUNTER — Ambulatory Visit (INDEPENDENT_AMBULATORY_CARE_PROVIDER_SITE_OTHER): Payer: Medicare Other | Admitting: Cardiovascular Disease

## 2016-04-14 ENCOUNTER — Encounter: Payer: Self-pay | Admitting: Cardiovascular Disease

## 2016-04-14 VITALS — BP 138/72 | HR 61 | Ht 66.0 in | Wt 218.6 lb

## 2016-04-14 DIAGNOSIS — I2510 Atherosclerotic heart disease of native coronary artery without angina pectoris: Secondary | ICD-10-CM

## 2016-04-14 DIAGNOSIS — Z79899 Other long term (current) drug therapy: Secondary | ICD-10-CM | POA: Diagnosis not present

## 2016-04-14 DIAGNOSIS — I10 Essential (primary) hypertension: Secondary | ICD-10-CM

## 2016-04-14 DIAGNOSIS — E785 Hyperlipidemia, unspecified: Secondary | ICD-10-CM

## 2016-04-14 DIAGNOSIS — G4733 Obstructive sleep apnea (adult) (pediatric): Secondary | ICD-10-CM | POA: Diagnosis not present

## 2016-04-14 DIAGNOSIS — E669 Obesity, unspecified: Secondary | ICD-10-CM | POA: Diagnosis not present

## 2016-04-14 DIAGNOSIS — I480 Paroxysmal atrial fibrillation: Secondary | ICD-10-CM

## 2016-04-14 DIAGNOSIS — M79605 Pain in left leg: Secondary | ICD-10-CM

## 2016-04-14 DIAGNOSIS — M79604 Pain in right leg: Secondary | ICD-10-CM | POA: Diagnosis not present

## 2016-04-14 IMAGING — US US RENAL
1 series · 14 of 25 positions shown · non-contrast
Comparison: CT abdomen pelvis of 07/02/2009 and ultrasound of the
kidneys of 07/01/2009

CLINICAL DATA: Chronic kidney disease retention

EXAM:
RENAL/URINARY TRACT ULTRASOUND COMPLETE

[Series 1: us renal · 0.32mm/px · 14 of 43 slices shown]
[im 1/43]
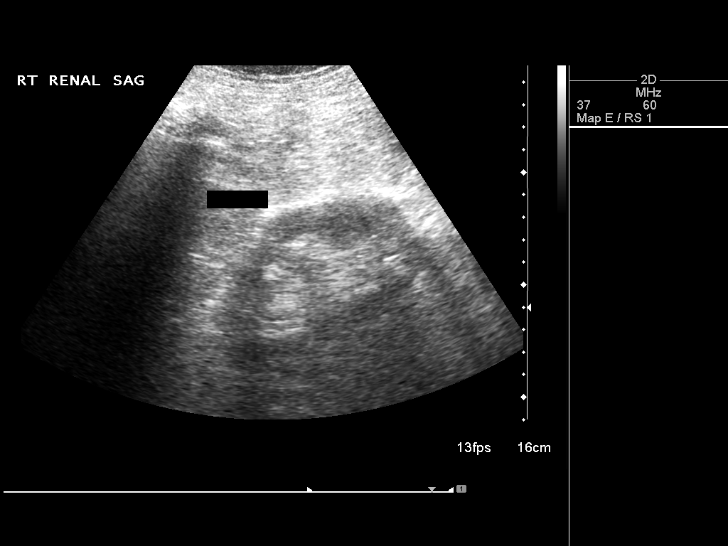
[im 4/43]
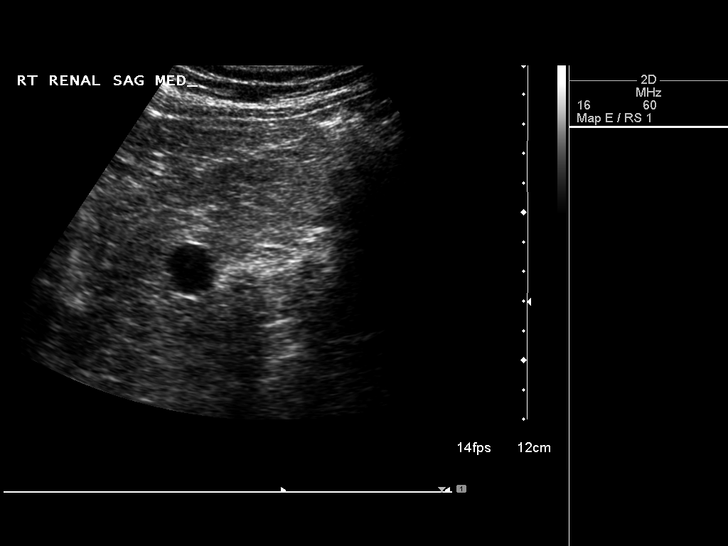
[im 8/43]
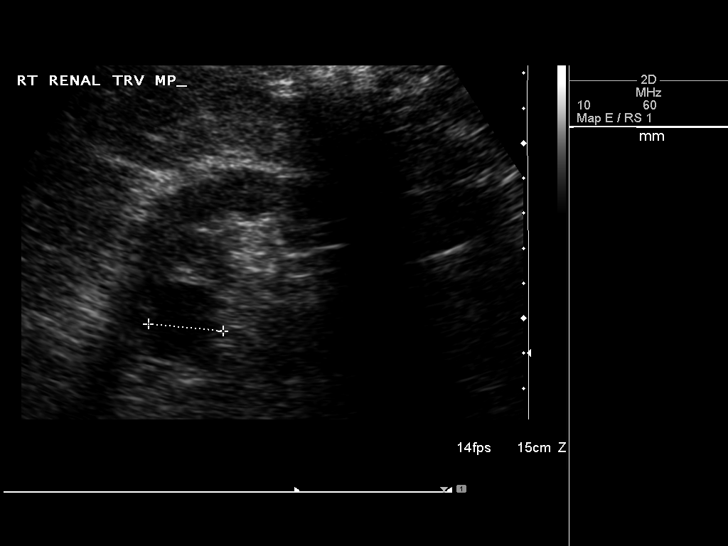
[im 11/43]
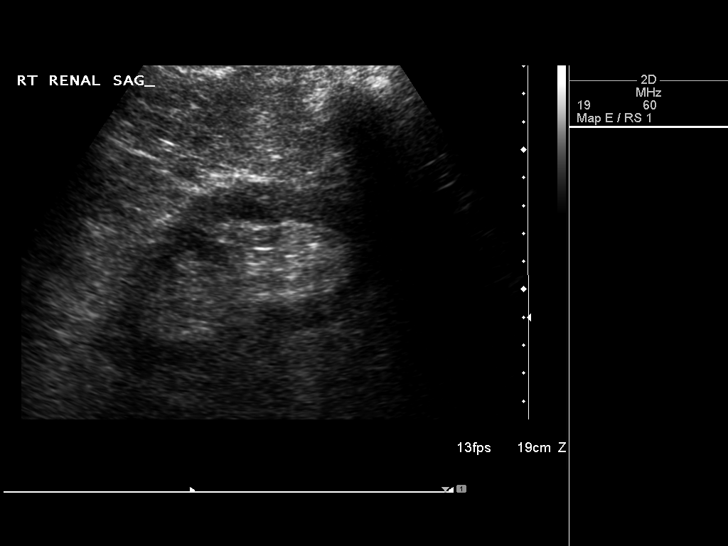
[im 15/43]
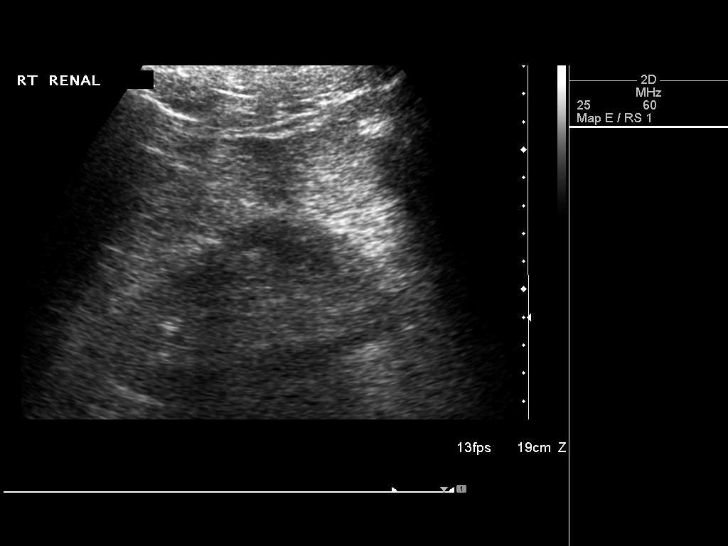
[im 16/43]
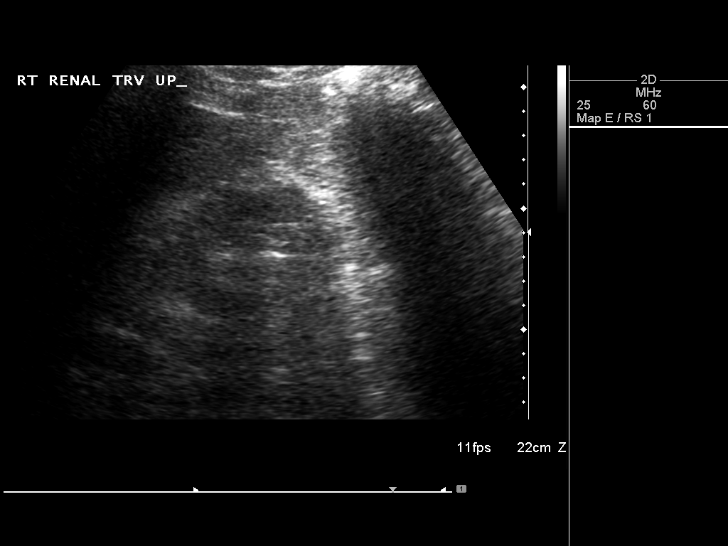
[im 20/43]
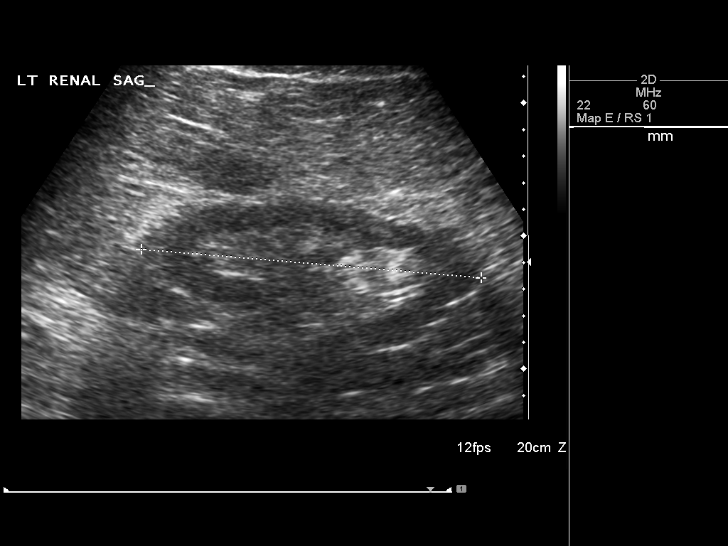
[im 23/43]
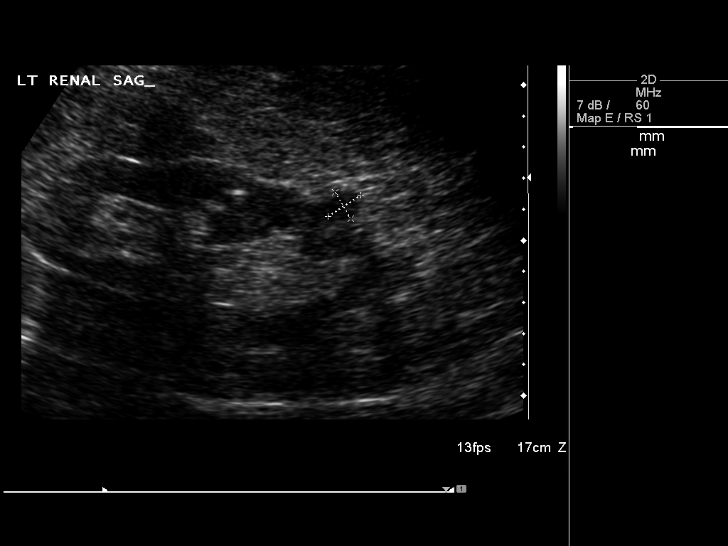
[im 27/43]
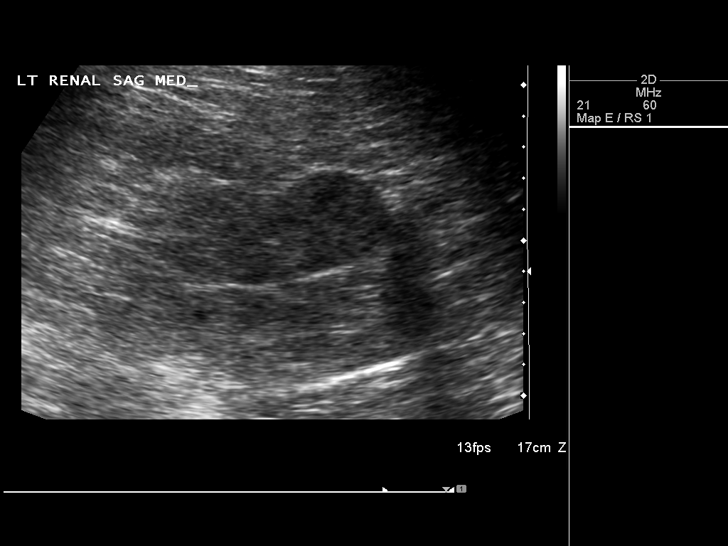
[im 29/43]
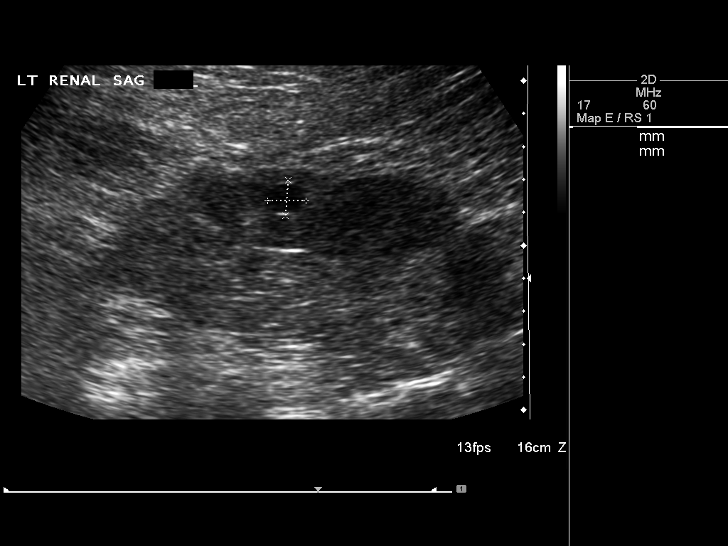
[im 32/43]
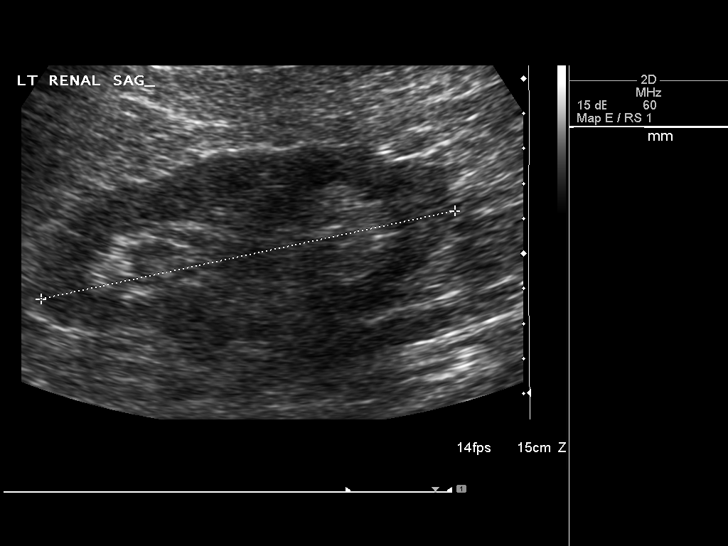
[im 36/43]
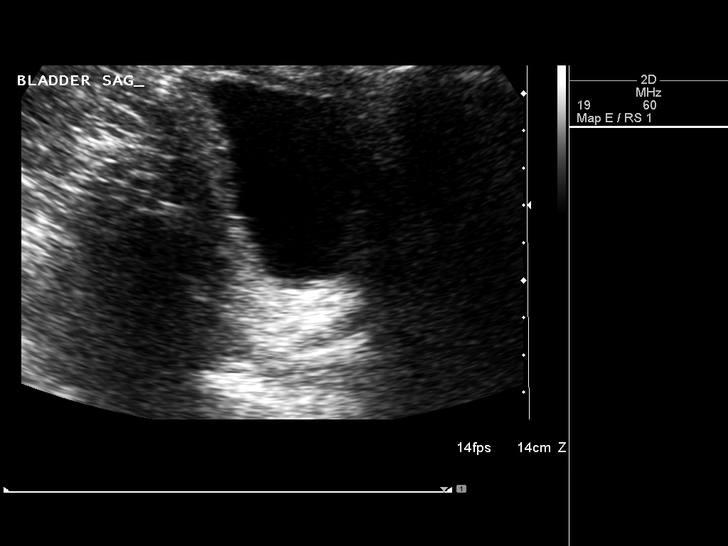
[im 39/43]
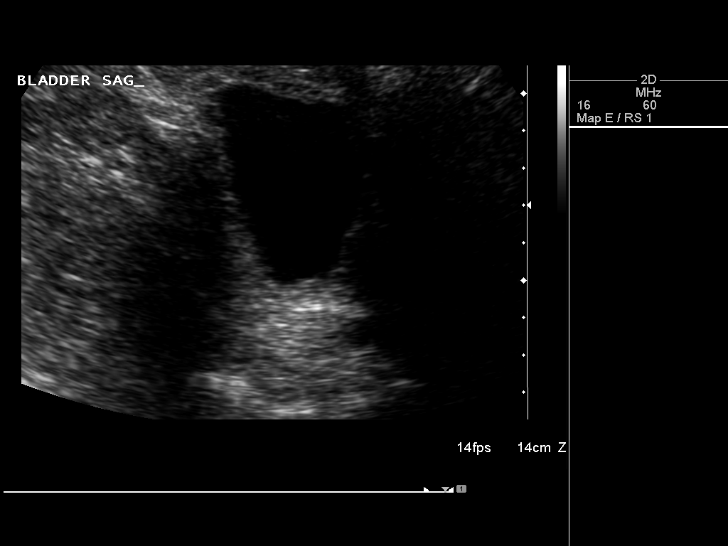
[im 43/43]
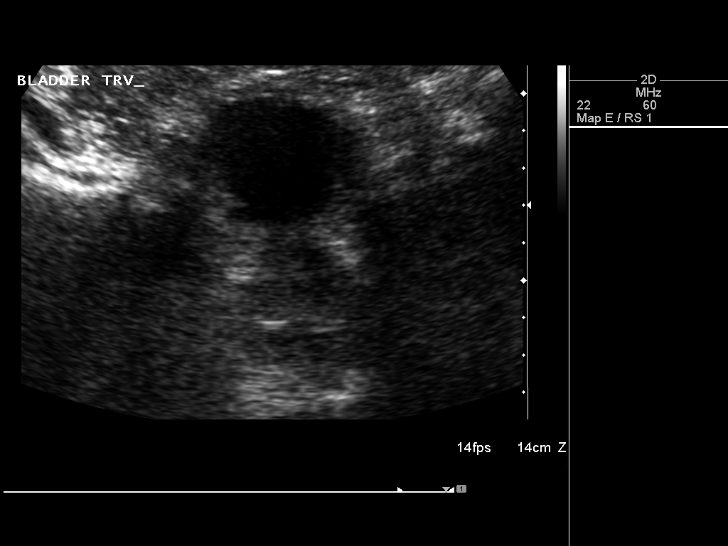

[14 of 25 positions shown; findings below may reference images not displayed]

FINDINGS: Right Kidney:

Length: 11.6 cm. Previous measurement was 11.5 cm.. Small cysts are
present in the upper and lower pole with the largest cyst in the
lower pole of 3.0 cm.

Left Kidney:

Length: 12.1 cm. Prior measurement of 12.5 cm. No hydronephrosis is
seen. Small cysts are noted none larger than 1.3 cm in diameter. .

Bladder:

The urinary bladder is moderately distended with no abnormality
noted.
IMPRESSION: No hydronephrosis.  Small bilateral renal cysts.

## 2016-04-14 MED ORDER — SPIRONOLACTONE 25 MG PO TABS: 12.5000 mg | ORAL_TABLET | Freq: Every day | ORAL | 6 refills | Status: DC

## 2016-04-14 NOTE — Patient Instructions (Addendum)
Medication Instructions:   Start spironolactone as directed on the bottle. This has been sent to your pharmacy.  Labwork:  Today. Slips provided.  Testing/Procedures:  Your physician has requested that you have an echocardiogram. Echocardiography is a painless test that uses sound waves to create images of your heart. It provides your doctor with information about the size and shape of your heart and how well your heart's chambers and valves are working. This procedure takes approximately one hour. There are no restrictions for this procedure.  Your physician has requested that you have a lexiscan myoview. For further information please visit [email or web address]. Please follow instruction sheet, as given.  Your physician has requested that you have a lower extremity arterial exercise duplex. During this test, exercise and ultrasound are used to evaluate arterial blood flow in the legs. Allow one hour for this exam. There are no restrictions or special instructions.   Follow-Up:  4 weeks.  Any Other Special Instructions Will Be Listed Below (If Applicable).

## 2016-04-14 NOTE — Progress Notes (Signed)
Patient ID: Paul Price, male   DOB: 09-25-1940, 76 y.o.   MRN: [id number]     Primary MD:  Dr. [name]  HPI: Paul Price is a 76 y.o. male who presents for a 6 month cardiology followup evaluation   Paul Price has established CAD and in 1992 underwent CABG revascularization surgery LIMA to the LAD and diagonal. In 2009 Paul Price was found to have an atretic LIMA graft to the LAD and a patent vein graft supplying the diagonal vessel. Paul Price had 70% narrowing in the LAD beyond the diagonal was treated medically. In April 2012 due to progressive chest pain and scintigraphic evidence for ischemia in the mid distal LAD territory repeat catheterization was performed and Paul Price underwent successful stenting to his LAD beyond the diagonal with a 3.0x20 mm Promus DES stent postdilated 3.25 mm. Subsequent, Paul Price has remained active and has done well. His last stress test in 2013 showed an apical defect without ischemia.  Additional problems include obstructive sleep apnea on CPAP therapy, moderate obesity, hypertension, history of DVT, and hyperlipidemia.  Paul Price sees Dr. Shelia Media for primary care.  On November 19, 2012 Paul Price saw Kerin Ransom.P[facility] for suspected atrial fibrillation and CHF after having been seen by his primary physician and noted some increasing shortness of breath with activity. A CardioNet monitor  demonstrated  a 4.3 second pause which led to a reduction in his  beta blocker dose  from Lopressor 75 mg twice a day to 25 twice a day. Paul Price saw Tarri Fuller on 12/02/2012 at which time Paul Price was in sinus rhythm but also was found to have probably 9 beat run of nonsustained VT on monitor which is Paul Price was maintained on low-dose beta blocker therapy. Subsequent cardiac monitoring did not show any further episodes of significant bradycardia but Paul Price did have one additional 9 beat run of nonsustained PSVT at a rate of 109 beats per minute on 12/17/12. . Laboratory done by Dr. Shelia Media on 11/27/2012 showed a potassium of 4.2. His  creatinine was 1.82. BUN 33.  An echo Doppler study done 12/09/2012 showed an estimated ejection fraction at 50-55%. Although no diagnostic regional wall motion abnormalities were definitively identified, this possibility was not completely excluded. There was grade 2 diastolic dysfunction. There is mild left atrial dilatation and mild mitral regurgitation.  A renal ultrasound has  demonstrate bilateral small renal cysts.    In 2015 Paul Price was found to have a DVT in his left leg and was started on eliquis by Dr. Shelia Media.  Paul Price was hospitalized on November 23 through 12/09/2013 and presented with fevers, and sudden onset of severe back pain.  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  Blood cultures were negative.   After 1 week in the hospital, Paul Price spent 3 weeks at rehabilitation.  Paul Price was treated with 4 weeks of antibiotic therapy and after his hospitalization.  Paul Price was followed by Dr. Michel Bickers from infectious disease.  Paul Price last saw him in March 2016 and att that time Paul Price was felt to be completely cleared cured.    Paul Price has renal insufficiency and has been followed by Dr. Gillermina Phy since Paul Price had developed acute on chronic kidney injury.  His serum creatinine peaked at 2.8.  Ultimately, this has improved.  Paul Price last saw Dr. Gillermina Phy in September 2016.    When I  saw him in follow-up, Paul Price continued to note lower extremity edema. Paul Price was no longer anticoagulation but has been on Plavix and aspirin.  I scheduled him for follow-up lower extremity venous ultrasound which showed resolution of his left distal femoral vein and popliteal vein thrombus . His blood pressure has been treated with Toprol-XL 50 mg losartan 100 mg furosemide 40 mg and amlodipine 10 mg.  Paul Price is on lipid lowering therapy with atorvastatin.  Paul Price takes allopurinol for gout.  There is history of GERD treated with ranitidine.    Paul Price admits to using his CPAP with 100% compliance.  Paul Price states Paul Price has a new machine.  However, recently Paul Price has  been noticing frequent awakenings.  Paul Price denies awareness of breakthrough snoring.  Paul Price cannot recall his last download.  Paul Price presents for evaluation.  Since I last saw him, Paul Price has noticed a change in symptomatology with more progressive exertional dyspnea.  Paul Price denies any exertional chest pressure.  However, Paul Price has noticed some nonexertional constant chest wall ache.  His shortness of breath has been occurring with less activity.  Paul Price is unaware of palpitations.  Paul Price denies presyncope or syncope.  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  Paul Price presents for reevaluation.  Past Medical History:  Diagnosis Date  . Blood clot in vein 12/2013   Left leg  . BPH (benign prostatic hyperplasia)   . Coronary artery disease   . DDD (degenerative disc disease), cervical   . Gout   . History of percutaneous left heart catheterization 04/2010  . History of stress test 06/2011   No significant ischemia, this is a low risk scan. Clinical correlation recommended Abnormal myocardial perfusion study.  Marland Kitchen Hx of echocardiogram 05/2009   EF 40-45%, Paul Price did have mild annular calcification with mild-to-moderate MR and mild TR as well as aortic valve sclerosis. Estimated RV systolic pressure was 21 mm.  . Hypertension   . Kidney failure   . 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 Neck pain   . Obesity   . OSA on CPAP   . RBBB   . Renal insufficiency   . Spinal stenosis of lumbar region     Past Surgical History:  Procedure Laterality Date  . CARDIAC CATHETERIZATION  2009   Paul Price was found to have a atretic LIMA graft to his LAD and had a patent vein graft supplying his diagonal vessel. At that time, Paul Price had 70% narrowing in his LAD beyond a diagonal vessel which was initially treated medically.  . CORONARY ANGIOPLASTY WITH STENT PLACEMENT  April 2012   LAD DES  . CORONARY ARTERY BYPASS GRAFT  1992   with LIMA to the LAD and  diagonal.    Allergies  Allergen Reactions  . Nitrostat [Nitroglycerin] Other (See Comments)    Causes blood pressure to "bottom out"    Current Outpatient Prescriptions  Medication Sig Dispense Refill  . allopurinol (ZYLOPRIM) 300 MG tablet Take 300 mg by mouth daily.    Marland Kitchen amLODipine (NORVASC) 10 MG tablet Take 1 tablet by mouth daily.    Marland Kitchen aspirin 81 MG tablet Take 81 mg by mouth daily.    Marland Kitchen atorvastatin (LIPITOR) 10 MG tablet Take 10 mg by mouth daily.    . clopidogrel (PLAVIX) 75 MG tablet TAKE 1 TABLET BY MOUTH DAILY 30 tablet 9  . fenofibrate micronized (LOFIBRA) 134 MG capsule Take 134 mg by mouth daily before breakfast.    . furosemide (LASIX) 40 MG tablet Take 40 mg by mouth daily.    Marland Kitchen  losartan (COZAAR) 100 MG tablet Take 100 mg by mouth daily.    . metoprolol succinate (TOPROL-XL) 50 MG 24 hr tablet Take 50 mg by mouth 2 (two) times daily. Take with or immediately following a meal.    . ranitidine (ZANTAC) 150 MG tablet Take 150 mg by mouth at bedtime.     . Tamsulosin HCl (FLOMAX) 0.4 MG CAPS Take 1 capsule (0.4 mg total) by mouth daily. 30 capsule 0  . traMADol (ULTRAM) 50 MG tablet Take 1 tablet by mouth as needed.    Marland Kitchen spironolactone (ALDACTONE) 25 MG tablet Take 0.5 tablets (12.5 mg total) by mouth daily. 30 tablet 6   No current facility-administered medications for this visit.     Social History   Social History  . Marital status: Married    Spouse name: N/A  . Number of children: N/A  . Years of education: N/A   Occupational History  . Not on file.   Social History Main Topics  . Smoking status: Former Research scientist (life sciences)  . Smokeless tobacco: Never Used     Comment: quit ~ 34 years ago  . Alcohol use No  . Drug use: No  . Sexual activity: Not on file   Other Topics Concern  . Not on file   Social History Narrative  . No narrative on file    Socially Paul Price is married, has 3 children 7 grandchildren. Paul Price does not use tobacco or alcohol.  ROS General:  Negative; No fevers, chills, or night sweats; Moderate obesity HEENT: Negative; No changes in vision or hearing, sinus congestion, difficulty swallowing Pulmonary: Negative; No cough, wheezing, shortness of breath, hemoptysis Cardiovascular: Negative; No chest pain, presyncope, syncope, palpatations GI: Negative; No nausea, vomiting, diarrhea, or abdominal pain GU: Negative; No dysuria, hematuria, or difficulty voiding Musculoskeletal: History of paraspinal myositis  Hematologic/Oncology: Negative; no easy bruising, bleeding Endocrine: Negative; no heat/cold intolerance; no diabetes Neuro: Occasional paresthesias; no changes in balance, headaches Skin: Negative; No rashes or skin lesions Psychiatric: Negative; No behavioral problems, depression Sleep: Positive for sleep apnea on CPAP; No snoring, daytime sleepiness, hypersomnolence, bruxism, restless legs, hypnogognic hallucinations, no cataplexy Other comprehensive 14 point system review is negative.   PE BP 138/72   Pulse 61   Ht 5' 6"  (1.676 m)   Wt 218 lb 9.6 oz (99.2 kg)   BMI 35.28 kg/m    Repeat blood pressure by me 130/70.  Wt Readings from Last 3 Encounters:  04/14/16 218 lb 9.6 oz (99.2 kg)  10/06/15 219 lb (99.3 kg)  03/15/15 218 lb (98.9 kg)   General: Alert, oriented, no distress.  Skin: normal turgor, no rashes HEENT: Normocephalic, atraumatic. Pupils round and reactive; sclera anicteric;no lid lag.  Nose without nasal septal hypertrophy Mouth/Parynx benign; Mallinpatti scale 3 Neck: Thick neck; No JVD, no carotid bruits with normal carotid upstroke  Lungs: clear to ausculatation and percussion; no wheezing or rales Chest wall: No tenderness to palpation Heart: RRR, s1 s2 normal 1/6 systolic murmur; no S3 gallop.  No diastolic murmur, rubs, thrills or heaves. Abdomen: Moderate central adiposity soft, nontender; no hepatosplenomehaly, BS+; abdominal aorta nontender and not dilated by palpation. Moderate diastases  recti. Back: No CVA tenderness Pulses 2+ Extremities: Very mild to trivial residual ankle swelling, improved from previously, Homan's sign negative  Neurologic: grossly nonfocal Psychological: Normal affect and mood  ECG (independently read by me):NSR At 61, left axis deviation.  Right bundle branch block.  September 2017 ECG (independently read by me): Normal sinus  rhythm with mild sinus arrhythmia.  First degree AV block with a PR interval at 212 ms.  Right bundle-branch block.  March 2017 ECG (independently read by me): Normal sinus rhythm at 61 bpm.  Right bundle branch block with repolarization changes.  QTc interval 477 ms.  PR interval 204 ms.  October 2016 ECG (independently read by me): Normal sinus rhythm at 65 bpm.  Right bundle branch block.  First degree AV block.  June 2015 ECG (independently read by me): Normal sinus rhythm at 61 beats per minute.  Right bundle branch block.  Mild voltage criteria for LVH in aVL  ECG (independently read by me): Sinus rhythm at 60 beats per minute. Right bundle branch block; PAC, inferolateral T abnormalities.  LABS: BMP Latest Ref Rng & Units 10/27/2015 11/18/2014 04/25/2014  Glucose 65 - 99 mg/dL 117(H) 114(H) 158(H)  BUN 7 - 25 mg/dL 25 33(H) 34(H)  Creatinine 0.70 - 1.18 mg/dL 1.79(H) 1.80(H) 1.80(H)  Sodium 135 - 146 mmol/L 144 142 139  Potassium 3.5 - 5.3 mmol/L 4.9 4.4 3.9  Chloride 98 - 110 mmol/L 106 105 103  CO2 20 - 31 mmol/L 27 24 21   Calcium 8.6 - 10.3 mg/dL 9.7 9.5 9.3   Hepatic Function Latest Ref Rng & Units 10/27/2015 11/18/2014 07/14/2013  Total Protein 6.1 - 8.1 g/dL 6.9 7.0 7.1  Albumin 3.6 - 5.1 g/dL 4.2 4.2 4.0  AST 10 - 35 U/L 24 25 27   ALT 9 - 46 U/L 21 20 24   Alk Phosphatase 40 - 115 U/L 97 90 59  Total Bilirubin 0.2 - 1.2 mg/dL 0.5 0.6 0.4   CBC Latest Ref Rng & Units 10/27/2015 11/18/2014 04/25/2014  WBC 3.8 - 10.8 K/uL 5.4 4.9 7.8  Hemoglobin 13.2 - 17.1 g/dL 13.7 14.0 13.1  Hematocrit 38.5 - 50.0 % 42.4  43.3 40.1  Platelets 140 - 400 K/uL 167 186 135(L)   Lab Results  Component Value Date   MCV 90.0 10/27/2015   MCV 92.3 11/18/2014   MCV 93.9 04/25/2014   Lab Results  Component Value Date   TSH 4.07 10/27/2015   No results found for: HGBA1C   Lipid Panel     Component Value Date/Time   CHOL 141 10/27/2015 0803   TRIG 192 (H) 10/27/2015 0803   HDL 28 (L) 10/27/2015 0803   CHOLHDL 5.0 10/27/2015 0803   VLDL 38 (H) 10/27/2015 0803   LDLCALC 75 10/27/2015 0803     BNP No results found for: PROBNP  Lipid Panel     Component Value Date/Time   CHOL 141 10/27/2015 0803     RADIOLOGY: No results found.  IMPRESSION:  1. Coronary artery disease involving native coronary artery of native heart without angina pectoris   2. Essential hypertension   3. OSA (obstructive sleep apnea)   4. Hyperlipidemia with target LDL less than 70   5. PAF (paroxysmal atrial fibrillation) (HCC)   6. Obesity (BMI 30-39.9)   7. Pain in both lower extremities   8. Medication management     ASSESSMENT AND PLAN: [name] is a 76 year old Caucasian male who is 26 years status post CABG revascularization surgery and 6 years since a stent was placed in the LAD beyond this diagonal vessel. Paul Price has a documented atretic LIMA graft and a patent vein graft which supplies this diagonal graft.   His last echo Doppler study in 2013  showed an ejection fraction of 50-55% and probable grade 2 diastolic dysfunction. Paul Price did have  mild left atrial dilatation and mild mitral regurgitation.  His blood pressure today is controlled on his current regimen consisting of Toprol-XL 100 mg, losartan 100 mg, and amlodipine 10 mg.  His  lipid studies in November 2016 showed an LDL of 58, total cholesterol 124, but his triglycerides were elevated at 201 and his HDL was low at 26.  Paul Price has continued to be on fenofibrate and atorvastatin 10 mg daily.  Paul Price admits to 100% CPAP use for his obstructive sleep apnea.  Paul Price is now  developing exertional dyspnea symptomatology.  In addition, Paul Price believes that when Paul Price walks.  Paul Price may experience some pain in his legs.  His last echo Doppler study was in 2013, and I am recommending a follow-up echo assessment to reassess both systolic and diastolic function.  Paul Price tells me that his renal function has improved from stage IV and is now on stage III.  I will try to  very low-dose spironolactone at  just 12.5 mg daily  but we will need to monitor laboratory to make certain his potassium and renal function remains stable.  With his renal insufficiency, I have recommended a follow-up Russell study for initial assessment of his exertional dyspnea to make certain that this is not significantly changed from his last stress test.  Because of potential claudication symptoms, I am scheduling him for lower extremity arterial Doppler study.  A complete set of fasting laboratory will be obtained  and I will see him in 4 weeks for follow-up cardiology evaluation.  Time spent: 25 minutes  Troy Sine, MD, [facility]  04/14/2016 11:05 AM

## 2016-04-20 ENCOUNTER — Ambulatory Visit: Payer: Medicare Other | Admitting: Student

## 2016-04-26 ENCOUNTER — Other Ambulatory Visit: Payer: Self-pay | Admitting: Cardiovascular Disease

## 2016-04-26 DIAGNOSIS — M79605 Pain in left leg: Secondary | ICD-10-CM

## 2016-04-26 DIAGNOSIS — M79604 Pain in right leg: Secondary | ICD-10-CM

## 2016-04-28 ENCOUNTER — Other Ambulatory Visit: Payer: Self-pay

## 2016-04-28 ENCOUNTER — Ambulatory Visit (HOSPITAL_COMMUNITY): Payer: Medicare Other | Attending: Cardiology

## 2016-04-28 DIAGNOSIS — I2510 Atherosclerotic heart disease of native coronary artery without angina pectoris: Secondary | ICD-10-CM | POA: Diagnosis not present

## 2016-04-28 DIAGNOSIS — I340 Nonrheumatic mitral (valve) insufficiency: Secondary | ICD-10-CM | POA: Insufficient documentation

## 2016-04-28 DIAGNOSIS — I10 Essential (primary) hypertension: Secondary | ICD-10-CM | POA: Diagnosis not present

## 2016-04-28 DIAGNOSIS — G4733 Obstructive sleep apnea (adult) (pediatric): Secondary | ICD-10-CM | POA: Insufficient documentation

## 2016-04-28 DIAGNOSIS — Z951 Presence of aortocoronary bypass graft: Secondary | ICD-10-CM | POA: Diagnosis not present

## 2016-04-28 DIAGNOSIS — I480 Paroxysmal atrial fibrillation: Secondary | ICD-10-CM | POA: Diagnosis not present

## 2016-04-28 DIAGNOSIS — E785 Hyperlipidemia, unspecified: Secondary | ICD-10-CM | POA: Diagnosis not present

## 2016-04-28 LAB — ECHOCARDIOGRAM COMPLETE
E decel time: 190 msec
FS: 32 % (ref 28–44)
IVS/LV PW RATIO, ED: 0.82
LA ID, A-P, ES: 55 mm
LA diam end sys: 55 mm
LA diam index: 2.64 cm/m2
LA vol A4C: 70 ml
LA vol index: 31.7 mL/m2
LA vol: 66 mL
LV PW d: 10.5 mm — AB (ref 0.6–1.1)
LVOT area: 3.14 cm2
LVOT diameter: 20 mm
MV Dec: 190
MV Peak grad: 3 mmHg
MV pk A vel: 57.8 m/s
MV pk E vel: 89.3 m/s
Reg peak vel: 252 cm/s
TR max vel: 252 cm/s

## 2016-05-02 ENCOUNTER — Telehealth (HOSPITAL_COMMUNITY): Payer: Self-pay

## 2016-05-02 NOTE — Telephone Encounter (Signed)
Encounter complete. 

## 2016-05-04 ENCOUNTER — Ambulatory Visit (HOSPITAL_COMMUNITY)
Admission: RE | Admit: 2016-05-04 | Discharge: 2016-05-04 | Disposition: A | Discharge status: Home / Self Care | Payer: Medicare Other | Source: Ambulatory Visit | Attending: Cardiology | Admitting: Cardiology

## 2016-05-04 DIAGNOSIS — G4733 Obstructive sleep apnea (adult) (pediatric): Secondary | ICD-10-CM | POA: Insufficient documentation

## 2016-05-04 DIAGNOSIS — Z87891 Personal history of nicotine dependence: Secondary | ICD-10-CM | POA: Insufficient documentation

## 2016-05-04 DIAGNOSIS — Z86718 Personal history of other venous thrombosis and embolism: Secondary | ICD-10-CM | POA: Diagnosis not present

## 2016-05-04 DIAGNOSIS — I129 Hypertensive chronic kidney disease with stage 1 through stage 4 chronic kidney disease, or unspecified chronic kidney disease: Secondary | ICD-10-CM | POA: Insufficient documentation

## 2016-05-04 DIAGNOSIS — Z951 Presence of aortocoronary bypass graft: Secondary | ICD-10-CM | POA: Diagnosis not present

## 2016-05-04 DIAGNOSIS — I480 Paroxysmal atrial fibrillation: Secondary | ICD-10-CM | POA: Diagnosis not present

## 2016-05-04 DIAGNOSIS — Z8249 Family history of ischemic heart disease and other diseases of the circulatory system: Secondary | ICD-10-CM | POA: Diagnosis not present

## 2016-05-04 DIAGNOSIS — M79605 Pain in left leg: Secondary | ICD-10-CM

## 2016-05-04 DIAGNOSIS — N183 Chronic kidney disease, stage 3 (moderate): Secondary | ICD-10-CM | POA: Diagnosis not present

## 2016-05-04 DIAGNOSIS — E669 Obesity, unspecified: Secondary | ICD-10-CM | POA: Diagnosis not present

## 2016-05-04 DIAGNOSIS — I2510 Atherosclerotic heart disease of native coronary artery without angina pectoris: Secondary | ICD-10-CM | POA: Insufficient documentation

## 2016-05-04 DIAGNOSIS — I10 Essential (primary) hypertension: Secondary | ICD-10-CM

## 2016-05-04 DIAGNOSIS — Z955 Presence of coronary angioplasty implant and graft: Secondary | ICD-10-CM | POA: Diagnosis not present

## 2016-05-04 DIAGNOSIS — M79604 Pain in right leg: Secondary | ICD-10-CM | POA: Diagnosis not present

## 2016-05-04 DIAGNOSIS — I739 Peripheral vascular disease, unspecified: Secondary | ICD-10-CM | POA: Diagnosis not present

## 2016-05-04 DIAGNOSIS — I4510 Unspecified right bundle-branch block: Secondary | ICD-10-CM | POA: Insufficient documentation

## 2016-05-04 DIAGNOSIS — I471 Supraventricular tachycardia: Secondary | ICD-10-CM | POA: Insufficient documentation

## 2016-05-04 LAB — MYOCARDIAL PERFUSION IMAGING
LV dias vol: 108 mL (ref 62–150)
LV sys vol: 57 mL
Peak HR: 63 {beats}/min
Rest HR: 52 {beats}/min
SDS: 0
SRS: 8
SSS: 8
TID: 1.18

## 2016-05-04 MED ORDER — TECHNETIUM TC 99M TETROFOSMIN IV KIT
10.4000 | PACK | Freq: Once | INTRAVENOUS | Status: AC | PRN
  Administered 2016-05-04: 10.4 via INTRAVENOUS
  Filled 2016-05-04: qty 11

## 2016-05-04 MED ORDER — AMINOPHYLLINE 25 MG/ML IV SOLN
75.0000 mg | Freq: Once | INTRAVENOUS | Status: AC
  Administered 2016-05-04: 75 mg via INTRAVENOUS

## 2016-05-04 MED ORDER — REGADENOSON 0.4 MG/5ML IV SOLN
0.4000 mg | Freq: Once | INTRAVENOUS | Status: AC
  Administered 2016-05-04: 0.4 mg via INTRAVENOUS

## 2016-05-04 MED ORDER — TECHNETIUM TC 99M TETROFOSMIN IV KIT
30.4000 | PACK | Freq: Once | INTRAVENOUS | Status: AC | PRN
  Administered 2016-05-04: 30.4 via INTRAVENOUS
  Filled 2016-05-04: qty 31

## 2016-05-17 DIAGNOSIS — G4733 Obstructive sleep apnea (adult) (pediatric): Secondary | ICD-10-CM | POA: Diagnosis not present

## 2016-05-25 ENCOUNTER — Ambulatory Visit (INDEPENDENT_AMBULATORY_CARE_PROVIDER_SITE_OTHER): Payer: Medicare Other | Admitting: Cardiovascular Disease

## 2016-05-25 ENCOUNTER — Encounter: Payer: Self-pay | Admitting: Cardiovascular Disease

## 2016-05-25 VITALS — BP 110/56 | HR 58 | Ht 66.0 in | Wt 219.0 lb

## 2016-05-25 DIAGNOSIS — N183 Chronic kidney disease, stage 3 unspecified: Secondary | ICD-10-CM

## 2016-05-25 DIAGNOSIS — I10 Essential (primary) hypertension: Secondary | ICD-10-CM | POA: Diagnosis not present

## 2016-05-25 DIAGNOSIS — E119 Type 2 diabetes mellitus without complications: Secondary | ICD-10-CM

## 2016-05-25 DIAGNOSIS — I4510 Unspecified right bundle-branch block: Secondary | ICD-10-CM

## 2016-05-25 DIAGNOSIS — I2510 Atherosclerotic heart disease of native coronary artery without angina pectoris: Secondary | ICD-10-CM

## 2016-05-25 DIAGNOSIS — I5030 Unspecified diastolic (congestive) heart failure: Secondary | ICD-10-CM | POA: Diagnosis not present

## 2016-05-25 DIAGNOSIS — E785 Hyperlipidemia, unspecified: Secondary | ICD-10-CM | POA: Diagnosis not present

## 2016-05-25 MED ORDER — SPIRONOLACTONE 25 MG PO TABS: 12.5000 mg | ORAL_TABLET | Freq: Two times a day (BID) | ORAL | 6 refills | Status: DC

## 2016-05-25 NOTE — Patient Instructions (Signed)
Your physician has recommended you make the following change in your medication:   1.) the spironolactone has been changed to 12.5 mg twice a day. ( 1/2 tablet twice a day)  2.) the furosemide has been changed to 20 mg daily. ( 1/2 tablet of the 40 mg)  Your physician recommends that you schedule a follow-up appointment with Dr Shelia Media.  Your physician recommends that you schedule a follow-up appointment in: 4 months with Dr Claiborne Billings.

## 2016-05-25 NOTE — Progress Notes (Signed)
Patient ID: Paul Price, male   DOB: May 21, 1940, 76 y.o.   MRN: [id number]     Primary MD:  Dr. [name]  HPI: Paul Price is a 76 y.o. male who presents for a one month cardiology followup evaluation   [name] has established CAD and in 1992 underwent CABG revascularization surgery LIMA to the LAD and diagonal. In 2009 he was found to have an atretic LIMA graft to the LAD and a patent vein graft supplying the diagonal vessel. He had 70% narrowing in the LAD beyond the diagonal was treated medically. In April 2012 due to progressive chest pain and scintigraphic evidence for ischemia in the mid distal LAD territory repeat catheterization was performed and he underwent successful stenting to his LAD beyond the diagonal with a 3.0x20 mm Promus DES stent postdilated 3.25 mm. Subsequent, he has remained active and has done well. His last stress test in 2013 showed an apical defect without ischemia.  Additional problems include obstructive sleep apnea on CPAP therapy, moderate obesity, hypertension, history of DVT, and hyperlipidemia.  He sees Dr. Shelia Media for primary care.  On November 19, 2012 he saw Kerin Ransom.PAC for suspected atrial fibrillation and CHF after having been seen by his primary physician and noted some increasing shortness of breath with activity. A CardioNet monitor  demonstrated  a 4.3 second pause which led to a reduction in his  beta blocker dose  from Lopressor 75 mg twice a day to 25 twice a day. He saw Tarri Fuller on 12/02/2012 at which time he was in sinus rhythm but also was found to have probably 9 beat run of nonsustained VT on monitor which is he was maintained on low-dose beta blocker therapy. Subsequent cardiac monitoring did not show any further episodes of significant bradycardia but he did have one additional 9 beat run of nonsustained PSVT at a rate of 109 beats per minute on 12/17/12. . Laboratory done by Dr. Shelia Media on 11/27/2012 showed a potassium of 4.2. His  creatinine was 1.82. BUN 33.  An echo Doppler study done 12/09/2012 showed an estimated ejection fraction at 50-55%. Although no diagnostic regional wall motion abnormalities were definitively identified, this possibility was not completely excluded. There was grade 2 diastolic dysfunction. There is mild left atrial dilatation and mild mitral regurgitation.  A renal ultrasound has  demonstrate bilateral small renal cysts.    In 2015 he was found to have a DVT in his left leg and was started on eliquis by Dr. Shelia Media.  He was hospitalized on November 23 through 12/09/2013 and presented with fevers, and sudden onset of severe back pain.  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  Blood cultures were negative.   After 1 week in the hospital, he spent 3 weeks at rehabilitation.  He was treated with 4 weeks of antibiotic therapy and after his hospitalization.  He was followed by Dr. Michel Bickers from infectious disease.  He last saw him in March 2016 and att that time he was felt to be completely cleared cured.    He has renal insufficiency and has been followed by Dr. Gillermina Phy since he had developed acute on chronic kidney injury.  His serum creatinine peaked at 2.8.  Ultimately, this has improved.    When I  saw him in follow-up, he continued to note lower extremity edema. He was no longer anticoagulation but has been on Plavix and aspirin.  I scheduled him for follow-up lower extremity venous ultrasound  which showed resolution of his left distal femoral vein and popliteal vein thrombus . His blood pressure has been treated with Toprol-XL 50 mg losartan 100 mg furosemide 40 mg and amlodipine 10 mg.  He is on lipid lowering therapy with atorvastatin.  He takes allopurinol for gout.  There is history of GERD treated with ranitidine.    He admits to using his CPAP with 100% compliance.  He states he has a new machine.  However, recently he has been noticing frequent awakenings.  He denies  awareness of breakthrough snoring.    When I last saw him, he noticed a change in symptomatology with more progressive exertional dyspnea.  He denied any exertional chest pressure.  However, he has noticed some nonexertional constant chest wall ache.  His shortness of breath was occurring with less activity.  He is unaware of palpitations.  He denies presyncope or syncope.  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   I scheduled him for an echo Doppler study which was done on 04/28/2016.  This showed an EF of 50-55%.  There was grade 2 diastolic dysfunction.  Septal motion.  She showed paradoxical motion.  The left atrium was severely dilated.  There was mild MR.  The RV was mildly dilated, as was the right atrium.  There was mild palmar hypertension with PA pressure 33.  He underwent a nuclear perfusion study to further evaluate his symptoms.  This remained low risk.  Although calculated at 47% EF, visually it appeared to be 55%.  There was a small nonreversible defect in the mid anterior and apical location without ischemia.  He also underwent lower Sche[place]ri arterial Doppler studies which showed normal ABIs bilaterally and there was no evidence for segmental lower extremity arterial disease.  He had lab work done by Dr. Shelia Media, which I have reviewed today.  Of note, creatinine was 1.76 on 04/14/2016.  Hemoglobin A1c was increased at 6.7.  Lipid studies revealed a total cluster 128, triglyceride 152, LDL 66, and he has a low HDL level of 32.  He presents for evaluation.  Past Medical History:  Diagnosis Date  . Blood clot in vein 12/2013   Left leg  . BPH (benign prostatic hyperplasia)   . Coronary artery disease   . DDD (degenerative disc disease), cervical   . Gout   . History of percutaneous left heart catheterization 04/2010  . History of stress test 06/2011   No significant ischemia, this is a low risk scan.  Clinical correlation recommended Abnormal myocardial perfusion study.  Marland Kitchen Hx of echocardiogram 05/2009   EF 40-45%, he did have mild annular calcification with mild-to-moderate MR and mild TR as well as aortic valve sclerosis. Estimated RV systolic pressure was 21 mm.  . Hypertension   . Kidney failure   . 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 Neck pain   . Obesity   . OSA on CPAP   . RBBB   . Renal insufficiency   . Spinal stenosis of lumbar region     Past Surgical History:  Procedure Laterality Date  . CARDIAC CATHETERIZATION  2009   he was found to have a atretic LIMA graft to his LAD and had a patent vein graft supplying his diagonal vessel. At that time, he had 70% narrowing in his LAD beyond a diagonal vessel which was initially treated medically.  Marland Kitchen  CORONARY ANGIOPLASTY WITH STENT PLACEMENT  April 2012   LAD DES  . CORONARY ARTERY BYPASS GRAFT  1992   with LIMA to the LAD and diagonal.    Allergies  Allergen Reactions  . Nitrostat [Nitroglycerin] Other (See Comments)    Causes blood pressure to "bottom out"    Current Outpatient Prescriptions  Medication Sig Dispense Refill  . allopurinol (ZYLOPRIM) 300 MG tablet Take 300 mg by mouth daily.    Marland Kitchen amLODipine (NORVASC) 10 MG tablet Take 1 tablet by mouth daily.    Marland Kitchen aspirin 81 MG tablet Take 81 mg by mouth daily.    Marland Kitchen atorvastatin (LIPITOR) 10 MG tablet Take 10 mg by mouth daily.    . clopidogrel (PLAVIX) 75 MG tablet TAKE 1 TABLET BY MOUTH DAILY 30 tablet 9  . fenofibrate micronized (LOFIBRA) 134 MG capsule Take 134 mg by mouth daily before breakfast.    . furosemide (LASIX) 40 MG tablet Take 20 mg by mouth daily.    Marland Kitchen losartan (COZAAR) 100 MG tablet Take 100 mg by mouth daily.    . metoprolol succinate (TOPROL-XL) 50 MG 24 hr tablet Take 50 mg by mouth 2 (two) times daily. Take with or immediately following a meal.    . ranitidine (ZANTAC) 150 MG tablet Take 150 mg by mouth at bedtime.     Marland Kitchen spironolactone  (ALDACTONE) 25 MG tablet Take 0.5 tablets (12.5 mg total) by mouth 2 (two) times daily. 30 tablet 6  . Tamsulosin HCl (FLOMAX) 0.4 MG CAPS Take 1 capsule (0.4 mg total) by mouth daily. 30 capsule 0  . traMADol (ULTRAM) 50 MG tablet Take 1 tablet by mouth as needed.     No current facility-administered medications for this visit.     Social History   Social History  . Marital status: Married    Spouse name: N/A  . Number of children: N/A  . Years of education: N/A   Occupational History  . Not on file.   Social History Main Topics  . Smoking status: Former Research scientist (life sciences)  . Smokeless tobacco: Never Used     Comment: quit ~ 34 years ago  . Alcohol use No  . Drug use: No  . Sexual activity: Not on file   Other Topics Concern  . Not on file   Social History Narrative  . No narrative on file    Socially he is married, has 3 children 7 grandchildren. He does not use tobacco or alcohol.  ROS General: Negative; No fevers, chills, or night sweats; Moderate obesity HEENT: Negative; No changes in vision or hearing, sinus congestion, difficulty swallowing Pulmonary: Negative; No cough, wheezing, shortness of breath, hemoptysis Cardiovascular: Negative; No chest pain, presyncope, syncope, palpatations GI: Negative; No nausea, vomiting, diarrhea, or abdominal pain GU: Negative; No dysuria, hematuria, or difficulty voiding Musculoskeletal: History of paraspinal myositis  Hematologic/Oncology: Negative; no easy bruising, bleeding Endocrine: Negative; no heat/cold intolerance; no diabetes Neuro: Occasional paresthesias; no changes in balance, headaches Skin: Negative; No rashes or skin lesions Psychiatric: Negative; No behavioral problems, depression Sleep: Positive for sleep apnea on CPAP; No snoring, daytime sleepiness, hypersomnolence, bruxism, restless legs, hypnogognic hallucinations, no cataplexy Other comprehensive 14 point system review is negative.   PE BP (!) 110/56   Pulse (!)  58   Ht '5\' 6"'$  (1.676 m)   Wt 219 lb (99.3 kg)   BMI 35.35 kg/m    Repeat blood pressure by me 130/80.  Wt Readings from Last 3 Encounters:  05/25/16  219 lb (99.3 kg)  05/04/16 218 lb (98.9 kg)  04/14/16 218 lb 9.6 oz (99.2 kg)   General: Alert, oriented, no distress.  Skin: normal turgor, no rashes, warm and dry HEENT: Normocephalic, atraumatic. Pupils equal round and reactive to light; sclera anicteric; extraocular muscles intact; Nose without nasal septal hypertrophy Mouth/Parynx benign; Mallinpatti scale 3 Neck: Thick neck; No JVD, no carotid bruits; normal carotid upstroke Lungs: clear to ausculatation and percussion; no wheezing or rales Chest wall: without tenderness to palpitation Heart: PMI not displaced, RRR, s1 s2 normal, 1/6 systolic murmur, no diastolic murmur, no rubs, gallops, thrills, or heaves Abdomen: Central adiposity, moderate soft, nontender; no hepatosplenomehaly, BS+; abdominal aorta nontender and not dilated by palpation. Back: no CVA tenderness Pulses 2+ Musculoskeletal: full range of motion, normal strength, no joint deformities Extremities: Trivial ankle edema no clubbing cyanosis , Homan's sign negative  Neurologic: grossly nonfocal; Cranial nerves grossly wnl Psychologic: Normal mood and affect   ECG (independently read by me): sinus bradycardia 58 bpm.  Left axis deviation.  Right bundle branch block with repolarization changes.  QTc interval 471 ms.  April 2018 ECG (independently read by me):NSR At 61, left axis deviation.  Right bundle branch block.  September 2017 ECG (independently read by me): Normal sinus rhythm with mild sinus arrhythmia.  First degree AV block with a PR interval at 212 ms.  Right bundle-branch block.  March 2017 ECG (independently read by me): Normal sinus rhythm at 61 bpm.  Right bundle branch block with repolarization changes.  QTc interval 477 ms.  PR interval 204 ms.  October 2016 ECG (independently read by me): Normal  sinus rhythm at 65 bpm.  Right bundle branch block.  First degree AV block.  June 2015 ECG (independently read by me): Normal sinus rhythm at 61 beats per minute.  Right bundle branch block.  Mild voltage criteria for LVH in aVL  ECG (independently read by me): Sinus rhythm at 60 beats per minute. Right bundle branch block; PAC, inferolateral T abnormalities.  LABS: I personally reviewed the lab work from 04/14/2016 done at [facility] 10/27/2015 11/18/2014 04/25/2014  Glucose 65 - 99 mg/dL 117(H) 114(H) 158(H)  BUN 7 - 25 mg/dL 25 33(H) 34(H)  Creatinine 0.70 - 1.18 mg/dL 1.79(H) 1.80(H) 1.80(H)  Sodium 135 - 146 mmol/L 144 142 139  Potassium 3.5 - 5.3 mmol/L 4.9 4.4 3.9  Chloride 98 - 110 mmol/L 106 105 103  CO2 20 - 31 mmol/L _0 Calcium 8.6 - 10.3 mg/dL 9.7 9.5 9.3   Hepatic Function Latest Ref Rng & Units 10/27/2015 11/18/2014 07/14/2013  Total Protein 6.1 - 8.1 g/dL 6.9 7.0 7.1  Albumin 3.6 - 5.1 g/dL 4.2 4.2 4.0  AST 10 - 35 U/L _1 ALT 9 - 46 U/L _2 Alk Phosphatase 40 - 115 U/L 97 90 59  Total Bilirubin 0.2 - 1.2 mg/dL 0.5 0.6 0.4   CBC Latest Ref Rng & Units 10/27/2015 11/18/2014 04/25/2014  WBC 3.8 - 10.8 K/uL 5.4 4.9 7.8  Hemoglobin 13.2 - 17.1 g/dL 13.7 14.0 13.1  Hematocrit 38.5 - 50.0 % 42.4 43.3 40.1  Platelets 140 - 400 K/uL 167 186 135(L)   Lab Results  Component Value Date   MCV 90.0 10/27/2015   MCV 92.3 11/18/2014   MCV 93.9 04/25/2014   Lab Results  Component Value Date   TSH 4.07 10/27/2015   No results found for:  HGBA1C   Lipid Panel     Component Value Date/Time   CHOL 141 10/27/2015 0803   TRIG 192 (H) 10/27/2015 0803   HDL 28 (L) 10/27/2015 0803   CHOLHDL 5.0 10/27/2015 0803   VLDL 38 (H) 10/27/2015 0803   LDLCALC 75 10/27/2015 0803     BNP No results found for: PROBNP  Lipid Panel     Component Value Date/Time   CHOL 141 10/27/2015 0803     RADIOLOGY: No results  found.  IMPRESSION:  1. Coronary artery disease involving native coronary artery of native heart without angina pectoris   2. Essential hypertension   3. RBBB   4. Hyperlipidemia with target LDL less than 70   5. CHF with left ventricular diastolic dysfunction, NYHA class 2 (HCC)   6. Stage III chronic kidney disease   7. Morbid obesity ([place])   8. Diabetes mellitus, new onset [facility])     ASSESSMENT AND PLAN: [name] is a 76 year old Caucasian male who is 26 years status post CABG revascularization surgery and 6 years since a stent was placed in the LAD beyond this diagonal vessel. He has a documented atretic LIMA graft and a patent vein graft which supplies this diagonal graft.   His prior echo Doppler study in 2013  showed an ejection fraction of 50-55% and probable grade 2 diastolic dysfunction. He did have mild left atrial dilatation and mild mitral regurgitation.  When I last saw him, he had experienced a change in symptomatology.  I reviewed with him at length his echo Doppler study, nuclear study, as well as his lower extremity arterial Dopplers.  Ejection fraction remains stable at 50-55%.  He has grade 2 diastolic dysfunction.  He does have severe left atrial enlargement with mild right atrial enlargement.  Mild pulmonary hypertension.  His nuclear perfusion study remains in the low risk.  There is a small defect in the mid apical and anterior wall consistent with prior infarction without associated ischemia.  He has stage III chronic kidney disease, and most recent creatinine was 1.76.  I am concerned that he may be developing overt diabetes with his hemoglobin A1c now in the diabetic range at 6.7.  I have recommended he follow-up with Dr. Shelia Media, since he may require initiation of therapy.  His most recent lipid studies are controlled with an LDL of 66, although triglycerides are minimally elevated at 152 and HDL is low at 32.  When I last saw him, because of his shortness of breath, I  initiated low-dose spironolactone at 12.5 mg daily.  With his grade 2 diastolic dysfunction, I have recommended increasing spironolactone to 12.5 mg twice a day and have suggested he decrease furosemide from 40 mg down to 20 mg.  His most recent potassium was normal at 4.3.  His lower 70 Doppler studies demonstrate normal ABIs and argue against claudication symptomatology.   Troy Sine, MD, [facility]  05/27/2016 6:23 PM

## 2016-06-16 DIAGNOSIS — I10 Essential (primary) hypertension: Secondary | ICD-10-CM | POA: Diagnosis not present

## 2016-06-16 DIAGNOSIS — N179 Acute kidney failure, unspecified: Secondary | ICD-10-CM | POA: Diagnosis not present

## 2016-06-16 DIAGNOSIS — I2510 Atherosclerotic heart disease of native coronary artery without angina pectoris: Secondary | ICD-10-CM | POA: Diagnosis not present

## 2016-06-16 DIAGNOSIS — N183 Chronic kidney disease, stage 3 (moderate): Secondary | ICD-10-CM | POA: Diagnosis not present

## 2016-07-13 DIAGNOSIS — N390 Urinary tract infection, site not specified: Secondary | ICD-10-CM | POA: Diagnosis not present

## 2016-07-13 DIAGNOSIS — I10 Essential (primary) hypertension: Secondary | ICD-10-CM | POA: Diagnosis not present

## 2016-07-13 DIAGNOSIS — Z7982 Long term (current) use of aspirin: Secondary | ICD-10-CM | POA: Diagnosis not present

## 2016-07-13 DIAGNOSIS — Z125 Encounter for screening for malignant neoplasm of prostate: Secondary | ICD-10-CM | POA: Diagnosis not present

## 2016-07-13 DIAGNOSIS — N184 Chronic kidney disease, stage 4 (severe): Secondary | ICD-10-CM | POA: Diagnosis not present

## 2016-07-17 DIAGNOSIS — E875 Hyperkalemia: Secondary | ICD-10-CM | POA: Diagnosis not present

## 2016-07-17 DIAGNOSIS — Z0001 Encounter for general adult medical examination with abnormal findings: Secondary | ICD-10-CM | POA: Diagnosis not present

## 2016-07-18 DIAGNOSIS — Z8673 Personal history of transient ischemic attack (TIA), and cerebral infarction without residual deficits: Secondary | ICD-10-CM | POA: Diagnosis not present

## 2016-07-18 DIAGNOSIS — I10 Essential (primary) hypertension: Secondary | ICD-10-CM | POA: Diagnosis not present

## 2016-07-18 DIAGNOSIS — E875 Hyperkalemia: Secondary | ICD-10-CM | POA: Diagnosis not present

## 2016-07-18 DIAGNOSIS — E785 Hyperlipidemia, unspecified: Secondary | ICD-10-CM | POA: Diagnosis not present

## 2016-07-31 DIAGNOSIS — E875 Hyperkalemia: Secondary | ICD-10-CM | POA: Diagnosis not present

## 2016-07-31 DIAGNOSIS — I10 Essential (primary) hypertension: Secondary | ICD-10-CM | POA: Diagnosis not present

## 2016-08-08 ENCOUNTER — Inpatient Hospital Stay (HOSPITAL_COMMUNITY)
Admission: EM | Admit: 2016-08-08 | Discharge: 2016-08-10 | DRG: 309 | Disposition: A | Discharge status: Home / Self Care | Payer: Medicare Other | Attending: Internal Medicine | Admitting: Internal Medicine

## 2016-08-08 ENCOUNTER — Emergency Department (HOSPITAL_COMMUNITY): Payer: Medicare Other

## 2016-08-08 DIAGNOSIS — Z7983 Long term (current) use of bisphosphonates: Secondary | ICD-10-CM

## 2016-08-08 DIAGNOSIS — Z888 Allergy status to other drugs, medicaments and biological substances status: Secondary | ICD-10-CM | POA: Diagnosis not present

## 2016-08-08 DIAGNOSIS — Z955 Presence of coronary angioplasty implant and graft: Secondary | ICD-10-CM

## 2016-08-08 DIAGNOSIS — Z87891 Personal history of nicotine dependence: Secondary | ICD-10-CM | POA: Diagnosis not present

## 2016-08-08 DIAGNOSIS — Z79891 Long term (current) use of opiate analgesic: Secondary | ICD-10-CM

## 2016-08-08 DIAGNOSIS — Z7982 Long term (current) use of aspirin: Secondary | ICD-10-CM

## 2016-08-08 DIAGNOSIS — E669 Obesity, unspecified: Secondary | ICD-10-CM | POA: Diagnosis present

## 2016-08-08 DIAGNOSIS — Z79899 Other long term (current) drug therapy: Secondary | ICD-10-CM

## 2016-08-08 DIAGNOSIS — Z951 Presence of aortocoronary bypass graft: Secondary | ICD-10-CM

## 2016-08-08 DIAGNOSIS — I4891 Unspecified atrial fibrillation: Secondary | ICD-10-CM | POA: Diagnosis present

## 2016-08-08 DIAGNOSIS — N179 Acute kidney failure, unspecified: Secondary | ICD-10-CM | POA: Diagnosis not present

## 2016-08-08 DIAGNOSIS — Z86718 Personal history of other venous thrombosis and embolism: Secondary | ICD-10-CM

## 2016-08-08 DIAGNOSIS — I130 Hypertensive heart and chronic kidney disease with heart failure and stage 1 through stage 4 chronic kidney disease, or unspecified chronic kidney disease: Secondary | ICD-10-CM | POA: Diagnosis not present

## 2016-08-08 DIAGNOSIS — Z7902 Long term (current) use of antithrombotics/antiplatelets: Secondary | ICD-10-CM

## 2016-08-08 DIAGNOSIS — I5032 Chronic diastolic (congestive) heart failure: Secondary | ICD-10-CM | POA: Diagnosis not present

## 2016-08-08 DIAGNOSIS — I10 Essential (primary) hypertension: Secondary | ICD-10-CM | POA: Diagnosis not present

## 2016-08-08 DIAGNOSIS — I358 Other nonrheumatic aortic valve disorders: Secondary | ICD-10-CM | POA: Diagnosis not present

## 2016-08-08 DIAGNOSIS — G4733 Obstructive sleep apnea (adult) (pediatric): Secondary | ICD-10-CM

## 2016-08-08 DIAGNOSIS — M109 Gout, unspecified: Secondary | ICD-10-CM | POA: Diagnosis present

## 2016-08-08 DIAGNOSIS — R404 Transient alteration of awareness: Secondary | ICD-10-CM | POA: Diagnosis not present

## 2016-08-08 DIAGNOSIS — I9589 Other hypotension: Secondary | ICD-10-CM | POA: Diagnosis not present

## 2016-08-08 DIAGNOSIS — I2510 Atherosclerotic heart disease of native coronary artery without angina pectoris: Secondary | ICD-10-CM | POA: Diagnosis not present

## 2016-08-08 DIAGNOSIS — I480 Paroxysmal atrial fibrillation: Secondary | ICD-10-CM | POA: Diagnosis not present

## 2016-08-08 DIAGNOSIS — Z9989 Dependence on other enabling machines and devices: Secondary | ICD-10-CM | POA: Diagnosis not present

## 2016-08-08 DIAGNOSIS — N400 Enlarged prostate without lower urinary tract symptoms: Secondary | ICD-10-CM | POA: Diagnosis not present

## 2016-08-08 DIAGNOSIS — E785 Hyperlipidemia, unspecified: Secondary | ICD-10-CM | POA: Diagnosis not present

## 2016-08-08 DIAGNOSIS — Z8249 Family history of ischemic heart disease and other diseases of the circulatory system: Secondary | ICD-10-CM

## 2016-08-08 DIAGNOSIS — N183 Chronic kidney disease, stage 3 (moderate): Secondary | ICD-10-CM | POA: Diagnosis present

## 2016-08-08 DIAGNOSIS — I959 Hypotension, unspecified: Secondary | ICD-10-CM | POA: Diagnosis present

## 2016-08-08 DIAGNOSIS — I5042 Chronic combined systolic (congestive) and diastolic (congestive) heart failure: Secondary | ICD-10-CM | POA: Diagnosis present

## 2016-08-08 DIAGNOSIS — Z6833 Body mass index (BMI) 33.0-33.9, adult: Secondary | ICD-10-CM

## 2016-08-08 DIAGNOSIS — R42 Dizziness and giddiness: Secondary | ICD-10-CM | POA: Diagnosis not present

## 2016-08-08 LAB — CBC
HCT: 40.7 % (ref 39.0–52.0)
Hemoglobin: 13.3 g/dL (ref 13.0–17.0)
MCH: 29.5 pg (ref 26.0–34.0)
MCHC: 32.7 g/dL (ref 30.0–36.0)
MCV: 90.2 fL (ref 78.0–100.0)
Platelets: 163 10*3/uL (ref 150–400)
RBC: 4.51 MIL/uL (ref 4.22–5.81)
RDW: 16.4 % — ABNORMAL HIGH (ref 11.5–15.5)
WBC: 6.6 10*3/uL (ref 4.0–10.5)

## 2016-08-08 LAB — BASIC METABOLIC PANEL
Anion gap: 12 (ref 5–15)
BUN: 70 mg/dL — ABNORMAL HIGH (ref 6–20)
CO2: 19 mmol/L — ABNORMAL LOW (ref 22–32)
Calcium: 9.4 mg/dL (ref 8.9–10.3)
Chloride: 105 mmol/L (ref 101–111)
Creatinine, Ser: 2.91 mg/dL — ABNORMAL HIGH (ref 0.61–1.24)
GFR calc Af Amer: 22 mL/min — ABNORMAL LOW (ref >60)
GFR calc non Af Amer: 19 mL/min — ABNORMAL LOW (ref >60)
Glucose, Bld: 149 mg/dL — ABNORMAL HIGH (ref 65–99)
Potassium: 4.9 mmol/L (ref 3.5–5.1)
Sodium: 136 mmol/L (ref 135–145)

## 2016-08-08 LAB — I-STAT TROPONIN, ED: Troponin i, poc: 0.01 ng/mL (ref 0.00–0.08)

## 2016-08-08 MED ORDER — HEPARIN BOLUS VIA INFUSION
4500.0000 [IU] | Freq: Once | INTRAVENOUS | Status: AC
  Administered 2016-08-08: 4500 [IU] via INTRAVENOUS
  Filled 2016-08-08: qty 4500

## 2016-08-08 MED ORDER — SODIUM CHLORIDE 0.9 % IV SOLN
INTRAVENOUS | Status: DC
  Administered 2016-08-08 – 2016-08-10 (×2): via INTRAVENOUS

## 2016-08-08 MED ORDER — SODIUM CHLORIDE 0.9 % IV BOLUS (SEPSIS)
1000.0000 mL | Freq: Once | INTRAVENOUS | Status: AC
  Administered 2016-08-08: 1000 mL via INTRAVENOUS

## 2016-08-08 MED ORDER — HEPARIN (PORCINE) IN NACL 100-0.45 UNIT/ML-% IJ SOLN
1200.0000 [IU]/h | INTRAMUSCULAR | Status: DC
  Administered 2016-08-08: 1350 [IU]/h via INTRAVENOUS
  Filled 2016-08-08: qty 250

## 2016-08-08 MED ORDER — ENOXAPARIN SODIUM 30 MG/0.3ML ~~LOC~~ SOLN: 30.0000 mg | SUBCUTANEOUS | Status: DC

## 2016-08-08 NOTE — ED Notes (Signed)
ED Provider at bedside. 

## 2016-08-08 NOTE — ED Triage Notes (Signed)
Patient comes in per [facility] with hypotension and new onset afib. Pt states his bp was 70/50 when he checked it at home. Ems bp 134/88, but noticed new onset of afib. No hx of afib. ekg afib with RBBB. Patient sees cardiology for HTN. Patient has been having events of hypotension and cardiology has been adjusting meds. C/o dizziness and fatigue for 2 weeks. 20 LH. EMS HR 70s, 98% RA. Denies cp. S/s intermittent. Hx stent, bypass, HTN.

## 2016-08-08 NOTE — H&P (Signed)
History and Physical  Paul Price:[id number] DOB: February 03, 1940 DOA: 08/08/2016  Referring physician: Domenic Moras, ER PA PCP: [name], MD  Outpatient Specialists: Has seen Paul Price, cardiology in the past Patient coming from: Home & is able to ambulate without assistance  Chief Complaint: Low blood pressure   HPI: Paul Price is a 76 y.o. male with medical history significant of CAD and previous DVT on eliquis in 2015 who for the last month has been having issues with low blood pressure. Patient had a blood pressure measurement today which was found to be low with a systolic in the 76P so he came to the emergency room for further evaluation  ED Course: In the emergency room, low blood pressure is confirmed, but patient was also found to be in A. fib with RVR. This was reportedly a new diagnosis. Patient was given a dose of IV fluids which improved his blood pressure and he started having significant urination after that. His heart rate came down on its own into the 50s-60s, although still staying in atrial fibrillation. Patient given a dose of Lovenox and hospitalist will call for further evaluation. Patient's lab work also noted a creatinine of 2.91 with his baseline being around 1.8.  Review of Systems: Patient seen in the emergency room . Pt complains of being hungry, otherwise he has no complaints   Pt denies any headaches, vision changes, dysphagia, chest pain, palpitations, shortness of breath, wheeze, cough, abdominal pain, hematuria, dysuria, constipation, diarrhea, focal extremity numbness weakness or pain .  Review of systems are otherwise negative   Past Medical History:  Diagnosis Date  . Blood clot in vein 12/2013   Left leg  . BPH (benign prostatic hyperplasia)   . Coronary artery disease   . DDD (degenerative disc disease), cervical   . Gout   . History of percutaneous left heart catheterization 04/2010  . History of stress test 06/2011   No significant  ischemia, this is a low risk scan. Clinical correlation recommended Abnormal myocardial perfusion study.  Marland Kitchen Hx of echocardiogram 05/2009   EF 40-45%, he did have mild annular calcification with mild-to-moderate MR and mild TR as well as aortic valve sclerosis. Estimated RV systolic pressure was 21 mm.  . Hypertension   . Kidney failure   . 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 Neck pain   . Obesity   . OSA on CPAP   . RBBB   . Renal insufficiency   . Spinal stenosis of lumbar region    Past Surgical History:  Procedure Laterality Date  . CARDIAC CATHETERIZATION  2009   he was found to have a atretic LIMA graft to his LAD and had a patent vein graft supplying his diagonal vessel. At that time, he had 70% narrowing in his LAD beyond a diagonal vessel which was initially treated medically.  . CORONARY ANGIOPLASTY WITH STENT PLACEMENT  April 2012   LAD DES  . CORONARY ARTERY BYPASS GRAFT  1992   with LIMA to the LAD and diagonal.    Social History:  reports that he has quit smoking a long time ago. He has never used smokeless tobacco. He reports that he does not drink alcohol or use drugs.  Ambulance without assistance. Lives at home with his wife   Allergies  Allergen Reactions  . Nitrostat [Nitroglycerin] Other (See Comments)    Causes blood pressure to "bottom out"    Family History  Problem Relation Age of Onset  .  Heart disease Father       Prior to Admission medications   Medication Sig Start Date End Date Taking? Authorizing Provider  allopurinol (ZYLOPRIM) 300 MG tablet Take 300 mg by mouth daily.   Yes [provider]  amLODipine (NORVASC) 10 MG tablet Take 10 mg by mouth daily.  10/27/14  Yes [provider]  aspirin 81 MG tablet Take 81 mg by mouth daily.   Yes [provider]  atorvastatin (LIPITOR) 10 MG tablet Take 10 mg by mouth daily.   Yes [provider]  clopidogrel (PLAVIX) 75 MG tablet TAKE 1 TABLET BY MOUTH  DAILY Patient taking differently: Take 75 mg by mouth in the morning 05/01/13  Yes Troy Sine, MD  fenofibrate micronized (LOFIBRA) 134 MG capsule Take 134 mg by mouth daily before breakfast.   Yes [provider]  furosemide (LASIX) 40 MG tablet Take 20 mg by mouth daily.   Yes [provider]  ibuprofen (ADVIL,MOTRIN) 200 MG tablet Take 200-400 mg by mouth every 6 (six) hours as needed (for pain or headaches).    Yes [provider]  losartan (COZAAR) 100 MG tablet Take 100 mg by mouth daily.   Yes [provider]  metoprolol succinate (TOPROL-XL) 50 MG 24 hr tablet Take 50 mg by mouth 2 (two) times daily. Take with or immediately following a meal.   Yes [provider]  ranitidine (ZANTAC) 150 MG tablet Take 150 mg by mouth at bedtime.    Yes [provider]  Tamsulosin HCl (FLOMAX) 0.4 MG CAPS Take 1 capsule (0.4 mg total) by mouth daily. 07/20/11  Yes Hunt, Meagan, MD  UNABLE TO FIND CPAP: At bedtime   Yes [provider]  spironolactone (ALDACTONE) 25 MG tablet Take 0.5 tablets (12.5 mg total) by mouth 2 (two) times daily. 05/25/16 08/23/16  Troy Sine, MD    Physical Exam: BP 139/75   Pulse 75   Resp 14   Ht 6' (1.829 m)   Wt 90.7 kg (200 lb)   SpO2 100%   BMI 27.12 kg/m   General:  Alert and oriented 3, no acute distress  Eyes: Sclera nonicteric, extraocular movements are intact  ENT: Normocephalic, atraumatic, mucous remains are slightly dry  Neck: Thick, narrow airway, no JVD  Cardiovascular: Irregular rhythm, borderline bradycardia  Respiratory: Clear to auscultation bilaterally  Abdomen: Soft, obese, nontender, positive bowel sounds  Skin: No skin breaks, tears or lesions  Musculoskeletal: No clubbing or cyanosis, trace pitting edema  Psychiatric: Patient is appropriate, no evidence of psychoses  Neurologic: No focal deficits            Labs on Admission:  Basic Metabolic Panel:  Recent  Labs Lab 08/08/16 1530  NA 136  K 4.9  CL 105  CO2 19*  GLUCOSE 149*  BUN 70*  CREATININE 2.91*  CALCIUM 9.4   Liver Function Tests: No results for input(s): AST, ALT, ALKPHOS, BILITOT, PROT, ALBUMIN in the last 168 hours. No results for input(s): LIPASE, AMYLASE in the last 168 hours. No results for input(s): AMMONIA in the last 168 hours. CBC:  Recent Labs Lab 08/08/16 1530  WBC 6.6  HGB 13.3  HCT 40.7  MCV 90.2  PLT 163   Cardiac Enzymes: No results for input(s): CKTOTAL, CKMB, CKMBINDEX, TROPONINI in the last 168 hours.  BNP (last 3 results) No results for input(s): BNP in the last 8760 hours.  ProBNP (last 3 results) No results for input(s): PROBNP  in the last 8760 hours.  CBG: No results for input(s): GLUCAP in the last 168 hours.  Radiological Exams on Admission: Dg Chest Port 1 View  Result Date: 08/08/2016 CLINICAL DATA:  Fatigue, hypotension EXAM: PORTABLE CHEST 1 VIEW COMPARISON:  12/01/2013 FINDINGS: Prior CABG. Cardiomegaly. No effusions or edema. No confluent opacities. No acute bony abnormality. Degenerative changes in the shoulders. IMPRESSION: Cardiomegaly.  No active disease. Electronically Signed   By: Rolm Baptise M.D.   On: 08/08/2016 15:53    EKG: Independently reviewed. Atrial fibrillation, rate controlled   Assessment/Plan Present on Admission: . New onset a-fib ([place]) . HTN (hypertension) . Hyperlipidemia with target LDL less than 70 . Obesity (BMI 30-39.9) . Chronic diastolic (congestive) heart failure ([place]) . AKI (acute kidney injury) ([place]) . Hypotension  Principal Problem:   New onset a-fib [facility]): Patient has a previous report of proximal A. fib although looks like it was more incidental. Suspect his episodes of hypotension have been episodes of rapid A. fib. Given his diastolic dysfunction, he has a chads 2 score of 6. Have started him on IV heparin. Hopefully his renal function will continue to improve and then we can start  xarelto in the morning. He was on eliquis before with no problems, however he did comment that the medication was quite expensive. Active Problems:   HTN (hypertension): See below in regards to hypotension   Hyperlipidemia with target LDL less than 70   Obesity (BMI 30-39.9): Patient is criteria with BMI greater than 30   OSA on CPAP: We'll continue nightly BiPAP   AKI (acute kidney injury) ([place]) in the setting of stage III chronic kidney disease: Suspect this is brought on by hypotension. Given the fact that with fluid bolus, had significant diuresis, should hopefully improve. Have started gentle IV fluids   Chronic diastolic (congestive) heart failure ([place]): Grade 2 diastolic dysfunction noted on echocardiogram. Watching when gently hydrating   Hypotension: Suspect that he's been having episodes of A. fib with rapid ventricular late leading to inverse hypotension. Responded well to fluid bolus. Continue to monitor   DVT prophylaxis: Started on IV heparin. Likely can transition to [place] tomorrow as renal function hopefully improves   Code Status: Full code as confirmed by patient   Family Communication: Left message for wife who went home   Disposition Plan: Since her discharge next few days once renal function improved and started on anticoagulation   Consults called: Message left for cardiology   Admission status: Given the need for inpatient services expect to be here possibly for several days to improve kidney function and start anticoagulation, will admit under inpatient     Annita Brod MD [facility] Pager [phone]  If 7PM-7AM, please contact night-coverage [email or web address] Password [email or web address]  08/08/2016, 7:10 PM

## 2016-08-08 NOTE — Progress Notes (Signed)
[place] for heparin Indication: atrial fibrillation  Heparin Dosing Weight: 90.7 kg   Assessment: 64 yom admitted with new afib with RVR, also with AKI. Pharmacy consulted to dose heparin while awaiting improvement in renal function to transition to DOAC. Noted previously on apixaban for DVT in 2015 - no anticoagulation currently per patient. Lovenox ordered - d/c'd before dose given. CBC wnl. No bleed documented.  Goal of Therapy:  Heparin level 0.3-0.7 units/ml Monitor platelets by anticoagulation protocol: Yes   Plan:  Heparin 4500 unit bolus Start heparin at 1350 units/h 8h heparin level Daily heparin level/CBC Monitor s/sx bleeding   Elicia Lamp, PharmD, BCPS Clinical Pharmacist 08/08/2016 9:43 PM

## 2016-08-08 NOTE — ED Provider Notes (Signed)
[facility] DEPT Provider Note   CSN: [id number] Arrival date & time: 08/08/16  1505     History   Chief Complaint Chief Complaint  Patient presents with  . Hypotension  . Atrial Fibrillation    HPI Paul Price is a 76 y.o. male.  HPI   76 year old male currently on Plavix, obesity, CAD, HTN brought here via EMS For evaluation of low blood pressure. Patient reports for the past month he has noticed that his blood pressure is low, usually 90 systolic. He also endorses associated generalized fatigue, occasional lightheadedness, and increase fatigue with exertion. He has been seen and evaluated by his primary care provider for this a week ago and was recommended to discontinue spironolactone, and cut down his metoprolol in half. He has been following instruction however blood pressure remains low. He checks his blood pressure twice daily. Today when he checked his blood pressure he noted that his systolic was in the 66Y which is the lowest that it has been. He reached out his PCP who recommend EMS to bring patient to the ER for further evaluation. Aside from his baseline weakness patient has no other complaint. He did recall one episode of warm sensation in his chest several days prior lasting for approximately 15 minutes and resolved. He complained of occasional heart palpitation but not often. Denies having fever, chills, URI symptoms, headache, chest pain, shortness of breath, diaphoresis, nausea vomiting diarrhea, abdominal pain back pain. EMS noted that patient was in A. Fib. Patient is not aware that he has history of A. fib. He states that his heart rate is usually in the 70s.  Past Medical History:  Diagnosis Date  . Blood clot in vein 12/2013   Left leg  . BPH (benign prostatic hyperplasia)   . Coronary artery disease   . DDD (degenerative disc disease), cervical   . Gout   . History of percutaneous left heart catheterization 04/2010  . History of stress test 06/2011   No  significant ischemia, this is a low risk scan. Clinical correlation recommended Abnormal myocardial perfusion study.  Marland Kitchen Hx of echocardiogram 05/2009   EF 40-45%, he did have mild annular calcification with mild-to-moderate MR and mild TR as well as aortic valve sclerosis. Estimated RV systolic pressure was 21 mm.  . Hypertension   . Kidney failure   . 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 Neck pain   . Obesity   . OSA on CPAP   . RBBB   . Renal insufficiency   . Spinal stenosis of lumbar region     Patient Active Problem List   Diagnosis Date Noted  . Primary gout   . 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 Syncope 12/02/2013  . Left leg DVT ([place]) 12/02/2013  . Fever 12/02/2013  . Renal insufficiency 07/02/2013  . PAF (paroxysmal atrial fibrillation) ([place]) 01/04/2013  . Sinus pause, 4.3 seconds.  Recorded by [facility]. 12/02/2012  . NSVT (nonsustained ventricular tachycardia) ([place]) 12/02/2012  . Dyspnea on exertion 11/20/2012  . 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 HTN (hypertension) 08/20/2012  . Hyperlipidemia with target LDL less than 70 08/20/2012  . Obesity (BMI 30-39.9) 08/20/2012  . OSA on CPAP 08/20/2012  . RBBB 08/20/2012    Past Surgical History:  Procedure Laterality Date  . CARDIAC CATHETERIZATION  2009   he was found to have a atretic LIMA graft to his LAD and had a patent vein graft  supplying his diagonal vessel. At that time, he had 70% narrowing in his LAD beyond a diagonal vessel which was initially treated medically.  . CORONARY ANGIOPLASTY WITH STENT PLACEMENT  April 2012   LAD DES  . CORONARY ARTERY BYPASS GRAFT  1992   with LIMA to the LAD and diagonal.       Home Medications    Prior to Admission medications   Medication Sig Start Date End Date Taking? Authorizing Provider  allopurinol (ZYLOPRIM) 300 MG tablet Take 300 mg by mouth daily.    [provider]  amLODipine (NORVASC) 10 MG tablet Take 1 tablet by  mouth daily. 10/27/14   [provider]  aspirin 81 MG tablet Take 81 mg by mouth daily.    [provider]  atorvastatin (LIPITOR) 10 MG tablet Take 10 mg by mouth daily.    [provider]  clopidogrel (PLAVIX) 75 MG tablet TAKE 1 TABLET BY MOUTH DAILY 05/01/13   Troy Sine, MD  fenofibrate micronized (LOFIBRA) 134 MG capsule Take 134 mg by mouth daily before breakfast.    [provider]  furosemide (LASIX) 40 MG tablet Take 20 mg by mouth daily.    [provider]  losartan (COZAAR) 100 MG tablet Take 100 mg by mouth daily.    [provider]  metoprolol succinate (TOPROL-XL) 50 MG 24 hr tablet Take 50 mg by mouth 2 (two) times daily. Take with or immediately following a meal.    [provider]  ranitidine (ZANTAC) 150 MG tablet Take 150 mg by mouth at bedtime.     [provider]  spironolactone (ALDACTONE) 25 MG tablet Take 0.5 tablets (12.5 mg total) by mouth 2 (two) times daily. 05/25/16 08/23/16  Troy Sine, MD  Tamsulosin HCl (FLOMAX) 0.4 MG CAPS Take 1 capsule (0.4 mg total) by mouth daily. 07/20/11   Chauncy Passy, MD  traMADol (ULTRAM) 50 MG tablet Take 1 tablet by mouth as needed. 07/12/15   [provider]    Family History Family History  Problem Relation Age of Onset  . Heart disease Father     Social History Social History  Substance Use Topics  . Smoking status: Former Research scientist (life sciences)  . Smokeless tobacco: Never Used     Comment: quit ~ 34 years ago  . Alcohol use No     Allergies   Nitrostat [nitroglycerin]   Review of Systems Review of Systems  All other systems reviewed and are negative.    Physical Exam Updated Vital Signs There were no vitals taken for this visit.  Physical Exam  Constitutional: He is oriented to person, place, and time. He appears well-developed and well-nourished. No distress.  HENT:  Head: Atraumatic.  Eyes: Conjunctivae are normal.  Neck: Neck  supple. No JVD present.  Cardiovascular:  Irregularly irregular heart rhythm without murmurs rubs or gallops  Pulmonary/Chest: Effort normal and breath sounds normal.  Abdominal: Soft. He exhibits no distension. There is no tenderness.  Musculoskeletal: He exhibits no edema.  Neurological: He is alert and oriented to person, place, and time.  Skin: No rash noted.  Psychiatric: He has a normal mood and affect.  Nursing note and vitals reviewed.    ED Treatments / Results  Labs (all labs ordered are listed, but only abnormal results are displayed) Labs Reviewed  BASIC METABOLIC PANEL - Abnormal; Notable for the following:       Result Value   CO2 19 (*)    Glucose, Bld  149 (*)    BUN 70 (*)    Creatinine, Ser 2.91 (*)    GFR calc non Af Amer 19 (*)    GFR calc Af Amer 22 (*)    All other components within normal limits  CBC - Abnormal; Notable for the following:    RDW 16.4 (*)    All other components within normal limits  CBC  CREATININE, SERUM  I-STAT TROPONIN, ED    EKG  EKG Interpretation  Date/Time:  Tuesday August 08 2016 15:22:32 EDT Ventricular Rate:  80 PR Interval:    QRS Duration: 147 QT Interval:  418 QTC Calculation: 483 R Axis:   -53 Text Interpretation:  No significant change since last tracing Confirmed by Zenovia Jarred [id number]) on 08/08/2016 3:34:49 PM       Radiology Dg Chest Port 1 View  Result Date: 08/08/2016 CLINICAL DATA:  Fatigue, hypotension EXAM: PORTABLE CHEST 1 VIEW COMPARISON:  12/01/2013 FINDINGS: Prior CABG. Cardiomegaly. No effusions or edema. No confluent opacities. No acute bony abnormality. Degenerative changes in the shoulders. IMPRESSION: Cardiomegaly.  No active disease. Electronically Signed   By: Rolm Baptise M.D.   On: 08/08/2016 15:53    Procedures Procedures (including critical care time)  Medications Ordered in ED Medications  sodium chloride 0.9 % bolus 1,000 mL (not administered)  enoxaparin (LOVENOX) injection  30 mg (not administered)     Initial Impression / Assessment and Plan / ED Course  I have reviewed the triage vital signs and the nursing notes.  Pertinent labs & imaging results that were available during my care of the patient were reviewed by me and considered in my medical decision making (see chart for details).     BP 139/75   Pulse 75   Resp 14   Ht 6' (1.829 m)   Wt 90.7 kg (200 lb)   SpO2 100%   BMI 27.12 kg/m    Final Clinical Impressions(s) / ED Diagnoses   Final diagnoses:  New onset a-fib ([place])  AKI (acute kidney injury) ([place])  Other specified hypotension    New Prescriptions New Prescriptions   No medications on file   3:30 PM Patient here due to report of recurrent low blood pressure with associated lightheadedness and dizziness. EMS noted that initial blood pressure was 134/88 with evidence of atrial fibrillation. Patient does not have a documented history of afib. (he had suspected afib on Nov 11th, 2014 without EKG documenting afib)  He does not have any active chest pain or short of breath at this time. His blood pressure stable at 139/75.  Pt currently on Plavix.  Was on Eliquis for DVT in the past.    CHAD2VASC of 3.    4:59 PM Evidence of acute on chronic renal insufficiency with a BUN of 70, creatinine 2.91, elevated from baseline.  5:07 PM Appreciate consultation from [facility] Dr. Gevena Barre who agrees to admit pt to telemetry bed under his care.     Paul Moras, PA-C 08/08/16 1724    [place], Fredia Sorrow, MD 08/10/16 [phone]

## 2016-08-09 ENCOUNTER — Other Ambulatory Visit (HOSPITAL_COMMUNITY): Payer: Medicare Other

## 2016-08-09 ENCOUNTER — Encounter (HOSPITAL_COMMUNITY): Payer: Self-pay | Admitting: *Deleted

## 2016-08-09 ENCOUNTER — Inpatient Hospital Stay (HOSPITAL_COMMUNITY): Payer: Medicare Other

## 2016-08-09 DIAGNOSIS — E785 Hyperlipidemia, unspecified: Secondary | ICD-10-CM

## 2016-08-09 DIAGNOSIS — G4733 Obstructive sleep apnea (adult) (pediatric): Secondary | ICD-10-CM

## 2016-08-09 DIAGNOSIS — Z9989 Dependence on other enabling machines and devices: Secondary | ICD-10-CM

## 2016-08-09 DIAGNOSIS — I9589 Other hypotension: Secondary | ICD-10-CM

## 2016-08-09 DIAGNOSIS — I5032 Chronic diastolic (congestive) heart failure: Secondary | ICD-10-CM

## 2016-08-09 DIAGNOSIS — N179 Acute kidney failure, unspecified: Secondary | ICD-10-CM

## 2016-08-09 DIAGNOSIS — I5042 Chronic combined systolic (congestive) and diastolic (congestive) heart failure: Secondary | ICD-10-CM

## 2016-08-09 DIAGNOSIS — I4891 Unspecified atrial fibrillation: Secondary | ICD-10-CM

## 2016-08-09 DIAGNOSIS — I10 Essential (primary) hypertension: Secondary | ICD-10-CM

## 2016-08-09 LAB — BASIC METABOLIC PANEL
Anion gap: 9 (ref 5–15)
BUN: 52 mg/dL — ABNORMAL HIGH (ref 6–20)
CO2: 21 mmol/L — ABNORMAL LOW (ref 22–32)
Calcium: 9.2 mg/dL (ref 8.9–10.3)
Chloride: 111 mmol/L (ref 101–111)
Creatinine, Ser: 2.32 mg/dL — ABNORMAL HIGH (ref 0.61–1.24)
GFR calc Af Amer: 29 mL/min — ABNORMAL LOW (ref >60)
GFR calc non Af Amer: 25 mL/min — ABNORMAL LOW (ref >60)
Glucose, Bld: 89 mg/dL (ref 65–99)
Potassium: 4.6 mmol/L (ref 3.5–5.1)
Sodium: 141 mmol/L (ref 135–145)

## 2016-08-09 LAB — CBC
HCT: 39.3 % (ref 39.0–52.0)
Hemoglobin: 12.8 g/dL — ABNORMAL LOW (ref 13.0–17.0)
MCH: 29.2 pg (ref 26.0–34.0)
MCHC: 32.6 g/dL (ref 30.0–36.0)
MCV: 89.7 fL (ref 78.0–100.0)
Platelets: 158 10*3/uL (ref 150–400)
RBC: 4.38 MIL/uL (ref 4.22–5.81)
RDW: 16.5 % — ABNORMAL HIGH (ref 11.5–15.5)
WBC: 5.7 10*3/uL (ref 4.0–10.5)

## 2016-08-09 LAB — ECHOCARDIOGRAM COMPLETE
Height: 66 in
Weight: 3328 oz

## 2016-08-09 LAB — BRAIN NATRIURETIC PEPTIDE: B Natriuretic Peptide: 335.1 pg/mL — ABNORMAL HIGH (ref 0.0–100.0)

## 2016-08-09 LAB — HEPARIN LEVEL (UNFRACTIONATED)
Heparin Unfractionated: 0.82 IU/mL — ABNORMAL HIGH (ref 0.30–0.70)
Heparin Unfractionated: 0.29 IU/mL — ABNORMAL LOW (ref 0.30–0.70)

## 2016-08-09 MED ORDER — PERFLUTREN LIPID MICROSPHERE
1.0000 mL | INTRAVENOUS | Status: AC | PRN
  Administered 2016-08-09: 2 mL via INTRAVENOUS
  Filled 2016-08-09: qty 10

## 2016-08-09 MED ORDER — FAMOTIDINE 20 MG PO TABS
10.0000 mg | ORAL_TABLET | Freq: Every day | ORAL | Status: DC
  Administered 2016-08-09 – 2016-08-10 (×2): 10 mg via ORAL
  Filled 2016-08-09 (×2): qty 1

## 2016-08-09 MED ORDER — HEPARIN (PORCINE) IN NACL 100-0.45 UNIT/ML-% IJ SOLN
1300.0000 [IU]/h | INTRAMUSCULAR | Status: AC
  Administered 2016-08-09: 1300 [IU]/h via INTRAVENOUS
  Filled 2016-08-09: qty 250

## 2016-08-09 MED ORDER — TAMSULOSIN HCL 0.4 MG PO CAPS
0.4000 mg | ORAL_CAPSULE | Freq: Every day | ORAL | Status: DC
  Administered 2016-08-09 – 2016-08-10 (×2): 0.4 mg via ORAL
  Filled 2016-08-09 (×2): qty 1

## 2016-08-09 MED ORDER — ASPIRIN EC 81 MG PO TBEC
81.0000 mg | DELAYED_RELEASE_TABLET | Freq: Every day | ORAL | Status: DC
  Administered 2016-08-09 – 2016-08-10 (×2): 81 mg via ORAL
  Filled 2016-08-09 (×2): qty 1

## 2016-08-09 MED ORDER — ALLOPURINOL 300 MG PO TABS
300.0000 mg | ORAL_TABLET | Freq: Every day | ORAL | Status: DC
  Administered 2016-08-09 – 2016-08-10 (×2): 300 mg via ORAL
  Filled 2016-08-09 (×2): qty 1

## 2016-08-09 MED ORDER — ATORVASTATIN CALCIUM 10 MG PO TABS
10.0000 mg | ORAL_TABLET | Freq: Every day | ORAL | Status: DC
  Administered 2016-08-09 – 2016-08-10 (×2): 10 mg via ORAL
  Filled 2016-08-09 (×2): qty 1

## 2016-08-09 MED ORDER — CLOPIDOGREL BISULFATE 75 MG PO TABS
75.0000 mg | ORAL_TABLET | Freq: Every day | ORAL | Status: DC
  Administered 2016-08-09 – 2016-08-10 (×2): 75 mg via ORAL
  Filled 2016-08-09 (×2): qty 1

## 2016-08-09 NOTE — Plan of Care (Signed)
Problem: Physical Regulation: Goal: Ability to maintain clinical measurements within normal limits will improve Outcome: Progressing IV fluids running at 75 cc/hr, Heparin at 13.5. BP has remained stable with systolic in the 469'G.   Problem: Activity: Goal: Risk for activity intolerance will decrease Outcome: Progressing Patient able to ambulate, uses bedside urinal.   Problem: Cardiac: Goal: Ability to achieve and maintain adequate cardiopulmonary perfusion will improve Outcome: Progressing Patient remains in A fib but with controlled rate.

## 2016-08-09 NOTE — Progress Notes (Signed)
PROGRESS NOTE    Paul Price  KXF:[id number] DOB: [birth year] DOA: 08/08/2016 PCP: [name], MD    Brief Narrative:  Paul Price is a 76 y.o. male with medical history significant of CAD and previous DVT on eliquis in 2015 presented with hypotension so he came to the emergency room for further evaluation, where he was found to have afib with RVR and in acute renal failure. Cardiology consulted and he was started on IV heparin.   Assessment & Plan:   Principal Problem:   New onset a-fib ([place]) Active Problems:   HTN (hypertension)   Hyperlipidemia with target LDL less than 70   Obesity (BMI 30-39.9)   OSA on CPAP   AKI (acute kidney injury) ([place])   Chronic diastolic (congestive) heart failure (HCC)   Hypotension   A-fib ([place])   New onset afib with RVR:  Rate better this am.  On metoprolol for rate control. Cardiology consulted and recommendations given.  On IV heparin, watching his creatinine to improve so we can discharge him on oral anti coagulant inam.  Echocardiogram pending.    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  Possibly from hypoperfusion from hypotension vs ace inhibitors and lasix, Gentle hydration and repeat renal parameters  Hypotension resolved.  Holding bp meds.    Chronic diastoilc hear failure:  Compensated.   Hyperlipidemia:  Stable.   OSA on CPAP at night.      DVT prophylaxis: heparin gtt.  Code Status: full code.  Family Communication: none at bedside.  Disposition Plan: home in am.    Consultants:   Cardiology.    Procedures: echocardiogram.    Antimicrobials: none.    Subjective: No chest pain or sob.  No nausea or vomiting.  Objective: Vitals:   08/08/16 2004 08/09/16 0023 08/09/16 0629 08/09/16 0638  BP: (!) 134/54 130/75 131/75   Pulse: (!) 58  (!) 57   Resp: 16  16   Temp: 97.7 F (36.5 C)  98 F (36.7 C)     TempSrc: Oral  Oral   SpO2: 100% 94% 97%   Weight:    94.3 kg (208 lb)  Height:        Intake/Output Summary (Last 24 hours) at 08/09/16 1438 Last data filed at 08/09/16 1100  Gross per 24 hour  Intake          2175.58 ml  Output             1190 ml  Net           985.58 ml   Filed Weights   08/08/16 1521 08/08/16 2000 08/09/16 0638  Weight: 90.7 kg (200 lb) 94.7 kg (208 lb 12.8 oz) 94.3 kg (208 lb)    Examination:  General exam: Appears calm and comfortable  Respiratory system: Clear to auscultation. Respiratory effort normal. Cardiovascular system: S1 & S2 heard, irregular. No JVD, murmurs, rubs, gallops or clicks. No pedal edema. Gastrointestinal system: Abdomen is nondistended, soft and nontender. No organomegaly or masses felt. Normal bowel sounds heard. Central nervous system: Alert and oriented. No focal neurological deficits. Extremities: Symmetric 5 x 5 power. Skin: No rashes, lesions or ulcers Psychiatry: Judgement and insight appear normal. Mood & affect appropriate.     Data Reviewed: I have personally reviewed following labs and imaging studies  CBC:  Recent Labs Lab 08/08/16 1530 08/09/16 0605  WBC 6.6 5.7  HGB 13.3 12.8*  HCT 40.7 39.3  MCV 90.2 89.7  PLT 163 264   Basic Metabolic Panel:  Recent Labs Lab 08/08/16 1530 08/09/16 0605  NA 136 141  K 4.9 4.6  CL 105 111  CO2 19* 21*  GLUCOSE 149* 89  BUN 70* 52*  CREATININE 2.91* 2.32*  CALCIUM 9.4 9.2   GFR: Estimated Creatinine Clearance: 28.2 mL/min (A) (by C-G formula based on SCr of 2.32 mg/dL (H)). Liver Function Tests: No results for input(s): AST, ALT, ALKPHOS, BILITOT, PROT, ALBUMIN in the last 168 hours. No results for input(s): LIPASE, AMYLASE in the last 168 hours. No results for input(s): AMMONIA in the last 168 hours. Coagulation Profile: No results for input(s): INR, PROTIME in the last 168 hours. Cardiac Enzymes: No results for input(s): CKTOTAL, CKMB, CKMBINDEX,  TROPONINI in the last 168 hours. BNP (last 3 results) No results for input(s): PROBNP in the last 8760 hours. HbA1C: No results for input(s): HGBA1C in the last 72 hours. CBG: No results for input(s): GLUCAP in the last 168 hours. Lipid Profile: No results for input(s): CHOL, HDL, LDLCALC, TRIG, CHOLHDL, LDLDIRECT in the last 72 hours. Thyroid Function Tests: No results for input(s): TSH, T4TOTAL, FREET4, T3FREE, THYROIDAB in the last 72 hours. Anemia Panel: No results for input(s): VITAMINB12, FOLATE, FERRITIN, TIBC, IRON, RETICCTPCT in the last 72 hours. Sepsis Labs: No results for input(s): PROCALCITON, LATICACIDVEN in the last 168 hours.  No results found for this or any previous visit (from the past 240 hour(s)).       Radiology Studies: Dg Chest Port 1 View  Result Date: 08/08/2016 CLINICAL DATA:  Fatigue, hypotension EXAM: PORTABLE CHEST 1 VIEW COMPARISON:  12/01/2013 FINDINGS: Prior CABG. Cardiomegaly. No effusions or edema. No confluent opacities. No acute bony abnormality. Degenerative changes in the shoulders. IMPRESSION: Cardiomegaly.  No active disease. Electronically Signed   By: Rolm Baptise M.D.   On: 08/08/2016 15:53        Scheduled Meds: . allopurinol  300 mg Oral Daily  . aspirin EC  81 mg Oral Daily  . atorvastatin  10 mg Oral Daily  . clopidogrel  75 mg Oral Daily  . famotidine  10 mg Oral Daily  . tamsulosin  0.4 mg Oral Daily   Continuous Infusions: . sodium chloride 75 mL/hr at 08/08/16 2152  . heparin 1,200 Units/hr (08/09/16 0739)     LOS: 1 day    Time spent: 35 minutes.     Hosie Poisson, MD [facility] Pager [phone]  If 7PM-7AM, please contact night-coverage [email or web address] Password [facility] 08/09/2016, 2:38 PM

## 2016-08-09 NOTE — Progress Notes (Signed)
ANTICOAGULATION CONSULT NOTE - Follow Up Consult  Pharmacy Consult for heparin Indication: atrial fibrillation  Labs:  Recent Labs  08/08/16 1530 08/09/16 0605  HGB 13.3 12.8*  HCT 40.7 39.3  PLT 163 158  HEPARINUNFRC  --  0.82*  CREATININE 2.91*  --     Assessment: 76yo male above goal on heparin with initial dosing for Afib.  Goal of Therapy:  Heparin level 0.3-0.7 units/ml   Plan:  Will decrease heparin gtt by 1-2 units/kg/hr to 1200 units/hr and check level in [place], PharmD, BCPS  08/09/2016,7:18 AM

## 2016-08-09 NOTE — Progress Notes (Signed)
ANTICOAGULATION CONSULT NOTE - Follow Up Consult  Pharmacy Consult for Heparin Indication: atrial fibrillation  Allergies  Allergen Reactions  . Nitrostat [Nitroglycerin] Other (See Comments)    Causes blood pressure to "bottom out"    Patient Measurements: Height: 5\' 6"  (167.6 cm) Weight: 208 lb (94.3 kg) IBW/kg (Calculated) : 63.8 Heparin Dosing Weight: 90.7kg  Vital Signs: Temp: 98 F (36.7 C) (08/01 0629) Temp Source: Oral (08/01 0629) BP: 131/75 (08/01 0629) Pulse Rate: 57 (08/01 0629)  Labs:  Recent Labs  08/08/16 1530 08/09/16 0605 08/09/16 1516  HGB 13.3 12.8*  --   HCT 40.7 39.3  --   PLT 163 158  --   HEPARINUNFRC  --  0.82* 0.29*  CREATININE 2.91* 2.32*  --     Estimated Creatinine Clearance: 28.2 mL/min (A) (by C-G formula based on SCr of 2.32 mg/dL (H)).   Medications:  Heparin @ 1200 units/hr  Assessment: 76yom continues on heparin for afib. Heparin level now slightly below goal at 0.29 after rate decrease earlier today.   Goal of Therapy:  Heparin level 0.3-0.7 units/ml Monitor platelets by anticoagulation protocol: Yes   Plan:  1) Increase heparin to 1300 units/hr 2) Check heparin level in 8 hours  Deboraha Sprang 08/09/2016,3:56 PM

## 2016-08-09 NOTE — Progress Notes (Signed)
  Echocardiogram 2D Echocardiogram has been performed.  Paul Price 08/09/2016, 2:05 PM

## 2016-08-09 NOTE — Consult Note (Signed)
Cardiology Consultation:   Patient ID: Paul Price; [id number]; [birth year]   Admit date: 08/08/2016 Date of Consult: 08/09/2016  Primary Care Provider: [name], MD Primary Cardiologist: Claiborne Billings Primary Electrophysiologist:  None    Patient Profile:   Paul Price is a 76 y.o. male with a hx of  CAD, DVT,  who is being seen today for the evaluation of atrial  fib  at the request of Dr. Karleen Hampshire .  History of Present Illness:   Paul Price is a 76 year old gentleman with a history of coronary disease-status post coronary artery bypass grafting,  DVT, obstructive sleep apnea. He's had a question of paroxysmal atrial fibrillation in the past. He's been compliant with his CPAP.  He presented to the hospital yesterday with low blood pressure  Past Medical History:  Diagnosis Date  . Blood clot in vein 12/2013   Left leg  . BPH (benign prostatic hyperplasia)   . Coronary artery disease   . DDD (degenerative disc disease), cervical   . Gout   . History of percutaneous left heart catheterization 04/2010  . History of stress test 06/2011   No significant ischemia, this is a low risk scan. Clinical correlation recommended Abnormal myocardial perfusion study.  Marland Kitchen Hx of echocardiogram 05/2009   EF 40-45%, he did have mild annular calcification with mild-to-moderate MR and mild TR as well as aortic valve sclerosis. Estimated RV systolic pressure was 21 mm.  . Hypertension   . Kidney failure   . 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 Neck pain   . Obesity   . OSA on CPAP   . RBBB   . Renal insufficiency   . Spinal stenosis of lumbar region     Past Surgical History:  Procedure Laterality Date  . CARDIAC CATHETERIZATION  2009   he was found to have a atretic LIMA graft to his LAD and had a patent vein graft supplying his diagonal vessel. At that time, he had 70% narrowing in his LAD beyond a diagonal vessel which was initially treated medically.  . CORONARY ANGIOPLASTY  WITH STENT PLACEMENT  April 2012   LAD DES  . CORONARY ARTERY BYPASS GRAFT  1992   with LIMA to the LAD and diagonal.     Inpatient Medications: Scheduled Meds: . allopurinol  300 mg Oral Daily  . aspirin EC  81 mg Oral Daily  . atorvastatin  10 mg Oral Daily  . clopidogrel  75 mg Oral Daily  . famotidine  10 mg Oral Daily  . tamsulosin  0.4 mg Oral Daily   Continuous Infusions: . sodium chloride 75 mL/hr at 08/08/16 2152  . heparin 1,200 Units/hr (08/09/16 0739)   PRN Meds: perflutren lipid microspheres (DEFINITY) IV suspension  Allergies:    Allergies  Allergen Reactions  . Nitrostat [Nitroglycerin] Other (See Comments)    Causes blood pressure to "bottom out"    Social History:   Social History   Social History  . Marital status: Married    Spouse name: N/A  . Number of children: N/A  . Years of education: N/A   Occupational History  . Not on file.   Social History Main Topics  . Smoking status: Former Research scientist (life sciences)  . Smokeless tobacco: Never Used     Comment: quit ~ 34 years ago  . Alcohol use No  . Drug use: No  . Sexual activity: Not on file   Other Topics Concern  . Not on file  Social History Narrative  . No narrative on file    Family History:   The patient's family history includes Heart disease in his father.  ROS:  Please see the history of present illness.  ROS  All other ROS reviewed and negative.     Physical Exam/Data:   Vitals:   08/08/16 2004 08/09/16 0023 08/09/16 0629 08/09/16 0638  BP: (!) 134/54 130/75 131/75   Pulse: (!) 58  (!) 57   Resp: 16  16   Temp: 97.7 F (36.5 C)  98 F (36.7 C)   TempSrc: Oral  Oral   SpO2: 100% 94% 97%   Weight:    208 lb (94.3 kg)  Height:        Intake/Output Summary (Last 24 hours) at 08/09/16 1411 Last data filed at 08/09/16 1100  Gross per 24 hour  Intake          2175.58 ml  Output             1190 ml  Net           985.58 ml   Filed Weights   08/08/16 1521 08/08/16 2000 08/09/16  0638  Weight: 200 lb (90.7 kg) 208 lb 12.8 oz (94.7 kg) 208 lb (94.3 kg)   Body mass index is 33.57 kg/m.  General:  Well nourished, well developed, in no acute distress HEENT: normal Lymph: no adenopathy Neck: no JVD Endocrine:  No thryomegaly Vascular: No carotid bruits; FA pulses 2+ bilaterally without bruits  Cardiac:   Irreg. Irreg. normal S1, S2 no murmur  Lungs:  clear to auscultation bilaterally, no wheezing, rhonchi or rales  Abd: soft, nontender, no hepatomegaly  Ext: no edema Musculoskeletal:  No deformities, BUE and BLE strength normal and equal Skin: warm and dry  Neuro:  CNs 2-12 intact, no focal abnormalities noted Psych:  Normal affect   EKG:  The EKG was personally reviewed and demonstrates:  Atrial fib at 80 Telemetry:  Telemetry was personally reviewed and demonstrates:   atial fib with controlled Vent response   Relevant CV Studies:   Laboratory Data:  Chemistry Recent Labs Lab 08/08/16 1530 08/09/16 0605  NA 136 141  K 4.9 4.6  CL 105 111  CO2 19* 21*  GLUCOSE 149* 89  BUN 70* 52*  CREATININE 2.91* 2.32*  CALCIUM 9.4 9.2  GFRNONAA 19* 25*  GFRAA 22* 29*  ANIONGAP 12 9    No results for input(s): PROT, ALBUMIN, AST, ALT, ALKPHOS, BILITOT in the last 168 hours. Hematology Recent Labs Lab 08/08/16 1530 08/09/16 0605  WBC 6.6 5.7  RBC 4.51 4.38  HGB 13.3 12.8*  HCT 40.7 39.3  MCV 90.2 89.7  MCH 29.5 29.2  MCHC 32.7 32.6  RDW 16.4* 16.5*  PLT 163 158   Cardiac EnzymesNo results for input(s): TROPONINI in the last 168 hours.  Recent Labs Lab 08/08/16 1551  TROPIPOC 0.01    BNP Recent Labs Lab 08/08/16 2219  BNP 335.1*    DDimer No results for input(s): DDIMER in the last 168 hours.  Radiology/Studies:  Dg Chest Port 1 View  Result Date: 08/08/2016 CLINICAL DATA:  Fatigue, hypotension EXAM: PORTABLE CHEST 1 VIEW COMPARISON:  12/01/2013 FINDINGS: Prior CABG. Cardiomegaly. No effusions or edema. No confluent opacities. No acute  bony abnormality. Degenerative changes in the shoulders. IMPRESSION: Cardiomegaly.  No active disease. Electronically Signed   By: Rolm Baptise M.D.   On: 08/08/2016 15:53    Assessment and Plan:   1.  Atrial fib:   HR is well controlled.   CHADS2VASC is 68   ( age 32, CAD, CHF, HTN )  He needs to be started on [facility].   Creatinine is still elevated but is improving . Agree with waiting to see how much that improves before choosing DOAC. No dyspnea to suggest that this was due to a PE. Compliant with CPAP  HR is well controlled and he is tolerating the afib well I think he can go home on DOAC and see Dr. Claiborne Billings as scheduled .   He can set him up for cardioversion at that time   2. HTN:   BP was low on admission Losartan has been held ( also because of his acute renal insufficiency)   3.  Chronic combined systolic and diastoic CHF:    Repeat echo is pending  4.  CAD :  No angina     Mertie Moores, MD  08/09/2016 2:11 PM

## 2016-08-10 DIAGNOSIS — I2510 Atherosclerotic heart disease of native coronary artery without angina pectoris: Secondary | ICD-10-CM

## 2016-08-10 LAB — BASIC METABOLIC PANEL
Anion gap: 7 (ref 5–15)
BUN: 36 mg/dL — ABNORMAL HIGH (ref 6–20)
CO2: 21 mmol/L — ABNORMAL LOW (ref 22–32)
Calcium: 9 mg/dL (ref 8.9–10.3)
Chloride: 114 mmol/L — ABNORMAL HIGH (ref 101–111)
Creatinine, Ser: 1.93 mg/dL — ABNORMAL HIGH (ref 0.61–1.24)
GFR calc Af Amer: 37 mL/min — ABNORMAL LOW (ref >60)
GFR calc non Af Amer: 32 mL/min — ABNORMAL LOW (ref >60)
Glucose, Bld: 100 mg/dL — ABNORMAL HIGH (ref 65–99)
Potassium: 4.5 mmol/L (ref 3.5–5.1)
Sodium: 142 mmol/L (ref 135–145)

## 2016-08-10 LAB — HEPARIN LEVEL (UNFRACTIONATED)
Heparin Unfractionated: 0.64 IU/mL (ref 0.30–0.70)
Heparin Unfractionated: 0.73 IU/mL — ABNORMAL HIGH (ref 0.30–0.70)

## 2016-08-10 LAB — CBC
HCT: 38.7 % — ABNORMAL LOW (ref 39.0–52.0)
Hemoglobin: 12.3 g/dL — ABNORMAL LOW (ref 13.0–17.0)
MCH: 28.9 pg (ref 26.0–34.0)
MCHC: 31.8 g/dL (ref 30.0–36.0)
MCV: 91.1 fL (ref 78.0–100.0)
Platelets: 145 10*3/uL — ABNORMAL LOW (ref 150–400)
RBC: 4.25 MIL/uL (ref 4.22–5.81)
RDW: 16.7 % — ABNORMAL HIGH (ref 11.5–15.5)
WBC: 5.5 10*3/uL (ref 4.0–10.5)

## 2016-08-10 MED ORDER — RIVAROXABAN 15 MG PO TABS: 15.0000 mg | ORAL_TABLET | Freq: Every day | ORAL | 1 refills | Status: DC

## 2016-08-10 MED ORDER — RIVAROXABAN 15 MG PO TABS
15.0000 mg | ORAL_TABLET | Freq: Every day | ORAL | Status: DC
  Administered 2016-08-10: 15 mg via ORAL
  Filled 2016-08-10: qty 1

## 2016-08-10 NOTE — Progress Notes (Signed)
ANTICOAGULATION CONSULT NOTE - Follow Up Consult  Pharmacy Consult for heparin Indication: atrial fibrillation  Labs:  Recent Labs  08/08/16 1530 08/09/16 0605 08/09/16 1516 08/10/16 0006  HGB 13.3 12.8*  --   --   HCT 40.7 39.3  --   --   PLT 163 158  --   --   HEPARINUNFRC  --  0.82* 0.29* 0.64  CREATININE 2.91* 2.32*  --   --     Assessment/Plan:  76yo male therapeutic on heparin after rate changes. Will continue gtt at current rate and confirm stable with additional level.   Wynona Neat, PharmD, BCPS  08/10/2016,1:38 AM

## 2016-08-10 NOTE — Discharge Summary (Signed)
Physician Discharge Summary  Paul Price JQB:[id number] DOB: 01-12-1940 DOA: 08/08/2016  PCP: [name], MD  Admit date: 08/08/2016 Discharge date: 08/10/2016  Admitted From: Home Disposition: HOme.   Recommendations for Outpatient Follow-up:  1. Follow up with PCP in 1-2 weeks 2. Please obtain BMP/CBC in one week 3. Please follow up with Dr Claiborne Billings in one week.   Discharge Condition: stable.  CODE STATUS: full code.  Diet recommendation: Heart Healthy  Brief/Interim Summary:  Paul Price Paul Price a 76 y.o.malewith medical history significant of CAD and previous DVT on eliquis in 2015 presented with hypotension so he came to the emergency room for further evaluation, where he was found to have afib with RVR and in acute renal failure. Cardiology consulted and he was started on IV heparin transitioned to oral xarelto on discharge.   Discharge Diagnoses:  Principal Problem:   New onset a-fib ([place]) Active Problems:   HTN (hypertension)   Hyperlipidemia with target LDL less than 70   Obesity (BMI 30-39.9)   OSA on CPAP   AKI (acute kidney injury) ([place])   Chronic diastolic (congestive) heart failure (HCC)   Hypotension   A-fib (HCC)  New onset afib with RVR:  Rate better this am.  On metoprolol for rate control, pt had HR in low 40's when he was sleeping and he had a 2.3 pause, discussed with Dr Katharina Caper, recommended to continue with the metoprolol . Cardiology consulted and recommendations given.  Transitioned to oral xarelto on discharge.  Recommend outpatient follow with Dr Claiborne Billings for possible cardioversion.  Recommended to stop the plavix on discharge.    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  Possibly from hypoperfusion from hypotension vs ace inhibitors and lasix, Gentle hydration and repeat renal parameters show much improvement.  Recommended to hold losartan,  spironolactone , until his renal parameters are back to baseline.   Hypotension resolved.     Chronic diastoilc hear failure:  Compensated.   Hyperlipidemia:  Stable.   OSA on CPAP at night.    Discharge Instructions  Discharge Instructions    Diet - low sodium heart healthy    Complete by:  As directed    Discharge instructions    Complete by:  As directed    Please follow up with Dr Claiborne Billings as recommended in one week, for possible cardioversion. We have held your losartan and lasix for acute renal failure.  Please check bmp in one week and resume losartan lasix.     Allergies as of 08/10/2016      Reactions   Nitrostat [nitroglycerin] Other (See Comments)   Causes blood pressure to "bottom out"      Medication List    STOP taking these medications   ibuprofen 200 MG tablet Commonly known as:  ADVIL,MOTRIN   losartan 100 MG tablet Commonly known as:  COZAAR   spironolactone 25 MG tablet Commonly known as:  ALDACTONE   clopidogrel 75 MG tablet Commonly known as:  PLAVIX TAKE 1 TABLET BY MOUTH DAILY What changed:  See the new instructions.   TAKE these medications   allopurinol 300 MG tablet Commonly known as:  ZYLOPRIM Take 300 mg by mouth daily.   amLODipine 10 MG tablet Commonly known as:  NORVASC Take 10 mg by mouth daily.   aspirin 81 MG tablet Take 81 mg by mouth daily.   atorvastatin 10 MG tablet Commonly known  as:  LIPITOR Take 10 mg by mouth daily.      fenofibrate micronized 134 MG capsule Commonly known as:  LOFIBRA Take 134 mg by mouth daily before breakfast.   furosemide 40 MG tablet Commonly known as:  LASIX Take 20 mg by mouth daily.   metoprolol succinate 50 MG 24 hr tablet Commonly known as:  TOPROL-XL Take 50 mg by mouth 2 (two) times daily. Take with or immediately following a meal.   ranitidine 150 MG tablet Commonly known as:  ZANTAC Take 150 mg by mouth at bedtime.   Rivaroxaban 15 MG Tabs tablet Commonly known  as:  XARELTO Take 1 tablet (15 mg total) by mouth daily with supper.   tamsulosin 0.4 MG Caps capsule Commonly known as:  FLOMAX Take 1 capsule (0.4 mg total) by mouth daily.   UNABLE TO FIND CPAP: At bedtime      Follow-up Information    [name], MD. Schedule an appointment as soon as possible for a visit in 1 week(s).   Specialty:  Internal Medicine Contact information: [address] [place] [address] [phone]        Troy Sine, MD. Schedule an appointment as soon as possible for a visit in 1 week(s).   Specialty:  Cardiology Why:  fo new onset  afib , on xarelto, for possible cardioversion.  Contact information: [address] [place] [address] [phone]          Allergies  Allergen Reactions  . Nitrostat [Nitroglycerin] Other (See Comments)    Causes blood pressure to "bottom out"    Consultations:  cardiology   Procedures/Studies: Dg Chest Port 1 View  Result Date: 08/08/2016 CLINICAL DATA:  Fatigue, hypotension EXAM: PORTABLE CHEST 1 VIEW COMPARISON:  12/01/2013 FINDINGS: Prior CABG. Cardiomegaly. No effusions or edema. No confluent opacities. No acute bony abnormality. Degenerative changes in the shoulders. IMPRESSION: Cardiomegaly.  No active disease. Electronically Signed   By: Rolm Baptise M.D.   On: 08/08/2016 15:53    Echocardiogram.    Subjective: No new complaints.   Discharge Exam: Vitals:   08/09/16 2040 08/10/16 0546  BP: 128/62 (!) 144/89  Pulse: 77 70  Resp: 16 16  Temp: 97.7 F (36.5 C) (!) 97.5 F (36.4 C)   Vitals:   08/09/16 0638 08/09/16 1700 08/09/16 2040 08/10/16 0546  BP:  132/81 128/62 (!) 144/89  Pulse:  77 77 70  Resp:  16 16 16   Temp:  98.3 F (36.8 C) 97.7 F (36.5 C) (!) 97.5 F (36.4 C)  TempSrc:  Oral Oral Oral  SpO2:  97% 98% 99%  Weight: 94.3 kg (208 lb)     Height:        General: Pt is alert, awake, not in acute distress Cardiovascular:  RRR, S1/S2 +, no rubs, no gallops Respiratory: CTA bilaterally, no wheezing, no rhonchi Abdominal: Soft, NT, ND, bowel sounds + Extremities: no edema, no cyanosis    The results of significant diagnostics from this hospitalization (including imaging, microbiology, ancillary and laboratory) are listed below for reference.     Microbiology: No results found for this or any previous visit (from the past 240 hour(s)).   Labs: BNP (last 3 results)  Recent Labs  08/08/16 2219  BNP 650.3*   Basic Metabolic Panel:  Recent Labs Lab 08/08/16 1530 08/09/16 0605 08/10/16 0748  NA 136 141 142  K 4.9 4.6 4.5  CL 105 111 114*  CO2 19* 21* 21*  GLUCOSE 149* 89 100*  BUN 70* 52* 36*  CREATININE 2.91* 2.32* 1.93*  CALCIUM 9.4 9.2 9.0   Liver Function Tests: No results for input(s): AST, ALT, ALKPHOS, BILITOT, PROT, ALBUMIN in the last 168 hours. No results for input(s): LIPASE, AMYLASE in the last 168 hours. No results for input(s): AMMONIA in the last 168 hours. CBC:  Recent Labs Lab 08/08/16 1530 08/09/16 0605 08/10/16 0530  WBC 6.6 5.7 5.5  HGB 13.3 12.8* 12.3*  HCT 40.7 39.3 38.7*  MCV 90.2 89.7 91.1  PLT 163 158 145*   Cardiac Enzymes: No results for input(s): CKTOTAL, CKMB, CKMBINDEX, TROPONINI in the last 168 hours. BNP: Invalid input(s): POCBNP CBG: No results for input(s): GLUCAP in the last 168 hours. D-Dimer No results for input(s): DDIMER in the last 72 hours. Hgb A1c No results for input(s): HGBA1C in the last 72 hours. Lipid Profile No results for input(s): CHOL, HDL, LDLCALC, TRIG, CHOLHDL, LDLDIRECT in the last 72 hours. Thyroid function studies No results for input(s): TSH, T4TOTAL, T3FREE, THYROIDAB in the last 72 hours.  Invalid input(s): FREET3 Anemia work up No results for input(s): VITAMINB12, FOLATE, FERRITIN, TIBC, IRON, RETICCTPCT in the last 72 hours. Urinalysis    Component Value Date/Time   COLORURINE YELLOW 12/03/2013 0921    APPEARANCEUR HAZY (A) 12/03/2013 0921   LABSPEC 1.020 12/03/2013 0921   PHURINE 5.0 12/03/2013 0921   GLUCOSEU NEGATIVE 12/03/2013 0921   HGBUR MODERATE (A) 12/03/2013 0921   BILIRUBINUR NEGATIVE 12/03/2013 0921   KETONESUR NEGATIVE 12/03/2013 0921   PROTEINUR 30 (A) 12/03/2013 0921   UROBILINOGEN 1.0 12/03/2013 0921   NITRITE NEGATIVE 12/03/2013 0921   LEUKOCYTESUR TRACE (A) 12/03/2013 0921   Sepsis Labs Invalid input(s): PROCALCITONIN,  WBC,  LACTICIDVEN Microbiology No results found for this or any previous visit (from the past 240 hour(s)).   Time coordinating discharge: Over 30 minutes  SIGNED:   Hosie Poisson, MD  [facility] 08/10/2016, 11:53 AM Pager   If 7PM-7AM, please contact night-coverage [email or web address] Password [email or web address]

## 2016-08-10 NOTE — Progress Notes (Signed)
Pt with multiple episodes of bradycardia with a HR reaching 38 with non-sustained pauses. Greatest pause tonight at 2.37 secs. Pt is found to be asleep with CPAP on and easy to arouse. Pt's HR returns to the 70's when awake. [facility] paged with an FYI, no new orders received. Will continue to monitor closely.  Jacqlyn Larsen, RN

## 2016-08-10 NOTE — Progress Notes (Signed)
Pt. Found on home CPAP tolerating well, Hospital CPAP pulled, aware to notify if needed.

## 2016-08-10 NOTE — Progress Notes (Signed)
Progress Note  [name] Date of Encounter: 08/10/2016  Primary Cardiologist: Dr Claiborne Billings  Patient Profile     76 y.o. male w/ hx  CABG, DVT, HTN, CKD, OSA, EF 40-45% 2011, who was admitted 07/31 with hypotension and rapid atrial  fib.  Subjective   No CP, no SOB, wants to go home. Agreeable to being on Xarelto and off losartan.  Inpatient Medications    Scheduled Meds: . allopurinol  300 mg Oral Daily  . aspirin EC  81 mg Oral Daily  . atorvastatin  10 mg Oral Daily  . clopidogrel  75 mg Oral Daily  . famotidine  10 mg Oral Daily  . rivaroxaban  15 mg Oral Q supper  . tamsulosin  0.4 mg Oral Daily   Continuous Infusions: . sodium chloride 75 mL/hr at 08/10/16 0119  . heparin 1,300 Units/hr (08/09/16 1718)   PRN Meds:    Vital Signs    Vitals:   08/09/16 0638 08/09/16 1700 08/09/16 2040 08/10/16 0546  BP:  132/81 128/62 (!) 144/89  Pulse:  77 77 70  Resp:  16 16 16   Temp:  98.3 F (36.8 C) 97.7 F (36.5 C) (!) 97.5 F (36.4 C)  TempSrc:  Oral Oral Oral  SpO2:  97% 98% 99%  Weight: 208 lb (94.3 kg)     Height:        Intake/Output Summary (Last 24 hours) at 08/10/16 1153 Last data filed at 08/10/16 0900  Gross per 24 hour  Intake          2323.09 ml  Output              850 ml  Net          1473.09 ml   Filed Weights   08/08/16 1521 08/08/16 2000 08/09/16 0638  Weight: 200 lb (90.7 kg) 208 lb 12.8 oz (94.7 kg) 208 lb (94.3 kg)    Telemetry    Atrial fib, controlled VR - Personally Reviewed  ECG    n/a - Personally Reviewed  Physical Exam   General: Well developed, well nourished, male appearing in no acute distress. Head: Normocephalic, atraumatic.  Neck: Supple without bruits, JVD not elevated. Lungs:  Resp regular and unlabored, CTA. Heart: Irreg R&R, S1, S2, no S3, S4, or murmur; no rub. Abdomen: Soft, non-tender, non-distended with normoactive bowel sounds. No hepatomegaly. No rebound/guarding. No obvious abdominal  masses. Extremities: No clubbing, cyanosis, no edema. Distal pedal pulses are 2+ bilaterally. Neuro: Alert and oriented X 3. Moves all extremities spontaneously. Psych: Normal affect.  Labs    Hematology Recent Labs Lab 08/08/16 1530 08/09/16 0605 08/10/16 0530  WBC 6.6 5.7 5.5  RBC 4.51 4.38 4.25  HGB 13.3 12.8* 12.3*  HCT 40.7 39.3 38.7*  MCV 90.2 89.7 91.1  MCH 29.5 29.2 28.9  MCHC 32.7 32.6 31.8  RDW 16.4* 16.5* 16.7*  PLT 163 158 145*    Chemistry Recent Labs Lab 08/08/16 1530 08/09/16 0605 08/10/16 0748  NA 136 141 142  K 4.9 4.6 4.5  CL 105 111 114*  CO2 19* 21* 21*  GLUCOSE 149* 89 100*  BUN 70* 52* 36*  CREATININE 2.91* 2.32* 1.93*  CALCIUM 9.4 9.2 9.0  GFRNONAA 19* 25* 32*  GFRAA 22* 29* 37*  ANIONGAP 12 9 7      Cardiac EnzymesNo results for input(s): TROPONINI in the last 168 hours.  Recent Labs Lab 08/08/16 1551  TROPIPOC 0.01     BNP  Recent Labs Lab 08/08/16 2219  BNP 335.1*     DDimer No results for input(s): DDIMER in the last 168 hours.   Radiology    Dg Chest Port 1 View  Result Date: 08/08/2016 CLINICAL DATA:  Fatigue, hypotension EXAM: PORTABLE CHEST 1 VIEW COMPARISON:  12/01/2013 FINDINGS: Prior CABG. Cardiomegaly. No effusions or edema. No confluent opacities. No acute bony abnormality. Degenerative changes in the shoulders. IMPRESSION: Cardiomegaly.  No active disease. Electronically Signed   By: Rolm Baptise M.D.   On: 08/08/2016 15:53     Cardiac Studies   ECHO: 08/09/2016 - Left ventricle: The cavity size was normal. Wall thickness was   normal. Indeterminant diastolic function (atrial fibrillation).   Systolic function was mildly reduced. The estimated ejection   fraction was in the range of 45% to 50%. Diffuse hypokinesis. - Aortic valve: There was no stenosis. - Mitral valve: Mildly calcified annulus. There was trivial   regurgitation. - Left atrium: The atrium was mildly dilated. - Right ventricle: The cavity  size was mildly dilated. Systolic   function was normal. - Right atrium: The atrium was mildly dilated. - Tricuspid valve: Peak RV-RA gradient (S): 20 mm Hg. - Pulmonary arteries: PA peak pressure: 23 mm Hg (S). - Inferior vena cava: The vessel was normal in size. The   respirophasic diameter changes were in the normal range (>= 50%),   consistent with normal central venous pressure. Impressions: - The patient was in atrial fibrillation. Normal LV size with EF   45-50%, mild diffuse hypokinesis. Mildly dilated RV with normal   systolic function. No significant valvular abnormalities.  Patient Profile     76 y.o. male w/ hx CABG, DVT, HTN, CKD, OSA, EF 40-45% 2011, who was admitted 07/31 with hypotension and rapid atrial  fib.  Assessment & Plan    1. Atrial fib:   HR is well controlled.   CHADS2VASC is 63   ( age 74, CAD, CHF, HTN )  He needs to be started on [facility].   Creatinine is still elevated but is improving . Xarelto is to be started today at 15 mg qd No dyspnea to suggest that this was due to a PE. Compliant with CPAP PTA was on Toprol XL 50 mg bid, was put back on this at d/c MD advise on using BB at a lower dose.  HR is well controlled and he is tolerating the afib well I think he can go home on DOAC and see Dr. Claiborne Billings as scheduled. Has appt for 09/19.  He can set him up for cardioversion at that time LA is severely dilated, RA is mildly dilated. If he does not maintain SR, decide on anti-arrhythmic vs rate control at that time.  2. HTN:   BP was low on admission Losartan has been held ( also because of his acute renal insufficiency)  Amlodipine held, restarted at full dose at d/c - Dr Claiborne Billings can decide about rechallenging him with this  3.  Chronic combined systolic and diastoic CHF:    Repeat echo is above. EF is slightly lower than 04/28/2016 Wt is 208 lbs now, was 219 when seen in office 05/25/2016 Volume status is at baseline.  4.  CAD :  No angina - he was  on ASA and Plavix - MD advise on d/c'ing the Plavix since his last PCI was 2012 and he is now on Xarelto  Otherwise, per IM. OK to d/c home.  Principal Problem:   New onset a-fib ([place])  Active Problems:   HTN (hypertension)   Hyperlipidemia with target LDL less than 70   Obesity (BMI 30-39.9)   OSA on CPAP   AKI (acute kidney injury) ([place])   Chronic diastolic (congestive) heart failure ([place])   Hypotension   A-fib ([place])    Signed, Rosaria Ferries , PA-C 11:53 AM 08/10/2016 Pager: [phone]  Attending Note:   The patient was seen and examined.  Agree with assessment and plan as noted above.  Changes made to the above note as needed.  Patient seen and independently examined with Rosaria Ferries, PA .   We discussed all aspects of the encounter. I agree with the assessment and plan as stated above.  1.  Atrial fib;   Starting Xarelto 15 mg a day  Follow up with Dr. Claiborne Billings   2. CAD :  No recent stenting.  Will DC plavix since he is now on Xarelto   3. HTN:  Holding Losartan due to renal insufficiency    I have spent a total of 40 minutes with patient reviewing hospital  notes , telemetry, EKGs, labs and examining patient as well as establishing an assessment and plan that was discussed with the patient. > 50% of time was spent in direct patient care.  OK for DC   Thayer Headings, Brooke Bonito., MD, [facility] 08/10/2016, 3:14 PM [address],  [place] Pager [phone]

## 2016-08-10 NOTE — Care Management Note (Signed)
Case Management Note  Patient Details  Name: Paul Price MRN: [id number] Date of Birth: Jun 01, 1940  Subjective/Objective:  Pt presented for new onset A Fib- plan for home on Xarelto. Co pay will be $45.00 and 30 day free card provided. PTA independent- from home.                   Action/Plan: No further needs identified at this time.   Expected Discharge Date:  08/10/16               Expected Discharge Plan:  Home/Self Care  In-House Referral:  NA  Discharge planning Services  CM Consult, Medication Assistance  Post Acute Care Choice:  NA Choice offered to:  NA  DME Arranged:  N/A DME Agency:  NA  HH Arranged:  NA HH Agency:  NA  Status of Service:  Completed, signed off  If discussed at [place] of Stay Meetings, dates discussed:    Additional Comments:  Bethena Roys, RN 08/10/2016, 1:01 PM

## 2016-08-10 NOTE — Progress Notes (Addendum)
ANTICOAGULATION CONSULT NOTE - Follow Up Consult  Pharmacy Consult for Heparin>> xarelto Indication: atrial fibrillation  Allergies  Allergen Reactions  . Nitrostat [Nitroglycerin] Other (See Comments)    Causes blood pressure to "bottom out"    Patient Measurements: Height: 5\' 6"  (167.6 cm) Weight: 208 lb (94.3 kg) IBW/kg (Calculated) : 63.8 Heparin Dosing Weight: 90.7kg  Vital Signs: Temp: 97.5 F (36.4 C) (08/02 0546) Temp Source: Oral (08/02 0546) BP: 144/89 (08/02 0546) Pulse Rate: 70 (08/02 0546)  Labs:  Recent Labs  08/08/16 1530  08/09/16 0605 08/09/16 1516 08/10/16 0006 08/10/16 0530 08/10/16 0748  HGB 13.3  --  12.8*  --   --  12.3*  --   HCT 40.7  --  39.3  --   --  38.7*  --   PLT 163  --  158  --   --  145*  --   HEPARINUNFRC  --   < > 0.82* 0.29* 0.64 0.73*  --   CREATININE 2.91*  --  2.32*  --   --   --  1.93*  < > = values in this interval not displayed.  Estimated Creatinine Clearance: 33.9 mL/min (A) (by C-G formula based on SCr of 1.93 mg/dL (H)).   Medications:  Heparin @ 1300 units/hr  Assessment: 76yom with afib on heparin to transition to Xarelto. He is noted on aspirin and plavix (history of CABG 1992 and stent placement in 2012) -SCr= 1.93 (down), CrCl ~ 35 (using total body weight)  Goal of Therapy:  Heparin level 0.3-0.7 units/ml Monitor platelets by anticoagulation protocol: Yes   Plan:  -Xarelto 150mg  po daily -Discontinue heparin -Could consider discontinuing plavix -Will provide education  Hildred Laser, Pharm D 08/10/2016 9:58 AM

## 2016-08-14 DIAGNOSIS — N184 Chronic kidney disease, stage 4 (severe): Secondary | ICD-10-CM | POA: Diagnosis not present

## 2016-08-15 ENCOUNTER — Telehealth: Payer: Self-pay | Admitting: *Deleted

## 2016-08-15 NOTE — Telephone Encounter (Addendum)
Message from [place] PA:  I left a vm for him to say that I could not move the appt up. Call if he needs to be seen, otherwise, wait till 09/19. Maybe a nurse visit in a week or 2 to check HR/BP and weight? Thanks   Called and spoke to patient.  Patient reports he takes his BP and HR daily and it has been running 109/54 HR 50s-70s.   Reports he has had continued dizziness but this is not new.   Advised to continue to monitor BP and HR daily and as symptoms occur and to call with any concerns.  Patient aware and verbalized understanding.

## 2016-08-21 DIAGNOSIS — K219 Gastro-esophageal reflux disease without esophagitis: Secondary | ICD-10-CM | POA: Diagnosis not present

## 2016-08-21 DIAGNOSIS — E785 Hyperlipidemia, unspecified: Secondary | ICD-10-CM | POA: Diagnosis not present

## 2016-08-21 DIAGNOSIS — Z09 Encounter for follow-up examination after completed treatment for conditions other than malignant neoplasm: Secondary | ICD-10-CM | POA: Diagnosis not present

## 2016-08-21 DIAGNOSIS — Z7982 Long term (current) use of aspirin: Secondary | ICD-10-CM | POA: Diagnosis not present

## 2016-08-21 DIAGNOSIS — I10 Essential (primary) hypertension: Secondary | ICD-10-CM | POA: Diagnosis not present

## 2016-08-21 DIAGNOSIS — I4891 Unspecified atrial fibrillation: Secondary | ICD-10-CM | POA: Diagnosis not present

## 2016-08-21 DIAGNOSIS — I2510 Atherosclerotic heart disease of native coronary artery without angina pectoris: Secondary | ICD-10-CM | POA: Diagnosis not present

## 2016-08-29 ENCOUNTER — Telehealth: Payer: Self-pay | Admitting: Cardiovascular Disease

## 2016-08-29 DIAGNOSIS — R5383 Other fatigue: Secondary | ICD-10-CM | POA: Diagnosis not present

## 2016-08-29 DIAGNOSIS — I10 Essential (primary) hypertension: Secondary | ICD-10-CM | POA: Diagnosis not present

## 2016-08-29 NOTE — Telephone Encounter (Signed)
Received notes from [facility] for appointment on 09/27/16 with Dr Claiborne Billings.  Records put with Dr Evette Georges schedule for 09/27/16. lp

## 2016-08-29 NOTE — Telephone Encounter (Signed)
New message    Fraser Din from [facility] is calling stating that Dr. Shelia Media wants pt seen next week. He was in the hospital for Afib. I offered the next available with Almyra Deforest on 9/6 and to put pt on wait list. Please call.

## 2016-08-31 NOTE — Telephone Encounter (Signed)
Ok to followup with Dr. Claiborne Billings, continue to monitor blood pressure. HR seems to be well controlled.

## 2016-08-31 NOTE — Telephone Encounter (Signed)
Pt advised on plan, verbalized understanding and thanks.

## 2016-08-31 NOTE — Telephone Encounter (Signed)
Spoke to Mr. Corales. Pt of Dr. Claiborne Billings A Fib  He got out of the hospital ~ 3 weeks ago and reports low energy since then. Occasional dizziness and blurry vision "when my blood pressure is low".    For now, his BPs average 100-110/40-55. Highest reading he's had recently is a systolic of 734. HR running 55-67.  His main concern is consistently borderline low BPs. States this has been a problem in the past, and he's had some adjustments up and down to his medications.  Clarified meds he is taking. Compliant w/ his Xarelto. His metoprolol succinate had been reduced to 25mg  BID prior to hospital admission, but was increased to 50mg  BID at discharge. He's been taking the 50mg  BID dose for 3 weeks.  He has also been taking amlodipine at 10mg  daily as reported on our med list - HOWEVER, he saw Dr. Shelia Media 2 days ago who adjusted this dose down to 5mg  daily to address BP. I do not have these visit notes on-hand.  Informed patient I would seek recommendation from provider, but with the recent med decrease we would probably wait on further changes. May need a few more days to reach full effect w dose reduction. Pt voiced understanding - will follow up w him as soon as able regarding any further considerations. Please advise if he does need f/u sooner than Dr. Claiborne Billings on 9/19.

## 2016-09-05 DIAGNOSIS — I10 Essential (primary) hypertension: Secondary | ICD-10-CM | POA: Diagnosis not present

## 2016-09-06 ENCOUNTER — Inpatient Hospital Stay (HOSPITAL_COMMUNITY)
Admission: EM | Admit: 2016-09-06 | Discharge: 2016-09-09 | DRG: 378 | Disposition: A | Discharge status: Home / Self Care | Payer: Medicare Other | Attending: Family Medicine | Admitting: Family Medicine

## 2016-09-06 ENCOUNTER — Encounter (HOSPITAL_COMMUNITY): Payer: Self-pay | Admitting: Emergency Medicine

## 2016-09-06 DIAGNOSIS — K921 Melena: Principal | ICD-10-CM | POA: Diagnosis present

## 2016-09-06 DIAGNOSIS — D509 Iron deficiency anemia, unspecified: Secondary | ICD-10-CM | POA: Diagnosis present

## 2016-09-06 DIAGNOSIS — I959 Hypotension, unspecified: Secondary | ICD-10-CM | POA: Diagnosis not present

## 2016-09-06 DIAGNOSIS — N400 Enlarged prostate without lower urinary tract symptoms: Secondary | ICD-10-CM | POA: Diagnosis not present

## 2016-09-06 DIAGNOSIS — I130 Hypertensive heart and chronic kidney disease with heart failure and stage 1 through stage 4 chronic kidney disease, or unspecified chronic kidney disease: Secondary | ICD-10-CM | POA: Diagnosis not present

## 2016-09-06 DIAGNOSIS — Z86718 Personal history of other venous thrombosis and embolism: Secondary | ICD-10-CM

## 2016-09-06 DIAGNOSIS — Z955 Presence of coronary angioplasty implant and graft: Secondary | ICD-10-CM | POA: Diagnosis not present

## 2016-09-06 DIAGNOSIS — Z6832 Body mass index (BMI) 32.0-32.9, adult: Secondary | ICD-10-CM | POA: Diagnosis not present

## 2016-09-06 DIAGNOSIS — M109 Gout, unspecified: Secondary | ICD-10-CM | POA: Diagnosis present

## 2016-09-06 DIAGNOSIS — Z7901 Long term (current) use of anticoagulants: Secondary | ICD-10-CM

## 2016-09-06 DIAGNOSIS — R0600 Dyspnea, unspecified: Secondary | ICD-10-CM | POA: Diagnosis present

## 2016-09-06 DIAGNOSIS — G4733 Obstructive sleep apnea (adult) (pediatric): Secondary | ICD-10-CM | POA: Diagnosis present

## 2016-09-06 DIAGNOSIS — N289 Disorder of kidney and ureter, unspecified: Secondary | ICD-10-CM

## 2016-09-06 DIAGNOSIS — Z7982 Long term (current) use of aspirin: Secondary | ICD-10-CM

## 2016-09-06 DIAGNOSIS — I10 Essential (primary) hypertension: Secondary | ICD-10-CM | POA: Diagnosis present

## 2016-09-06 DIAGNOSIS — I5032 Chronic diastolic (congestive) heart failure: Secondary | ICD-10-CM | POA: Diagnosis not present

## 2016-09-06 DIAGNOSIS — E669 Obesity, unspecified: Secondary | ICD-10-CM | POA: Diagnosis present

## 2016-09-06 DIAGNOSIS — Z888 Allergy status to other drugs, medicaments and biological substances status: Secondary | ICD-10-CM

## 2016-09-06 DIAGNOSIS — I9589 Other hypotension: Secondary | ICD-10-CM

## 2016-09-06 DIAGNOSIS — K648 Other hemorrhoids: Secondary | ICD-10-CM | POA: Diagnosis present

## 2016-09-06 DIAGNOSIS — K264 Chronic or unspecified duodenal ulcer with hemorrhage: Secondary | ICD-10-CM | POA: Diagnosis not present

## 2016-09-06 DIAGNOSIS — I4510 Unspecified right bundle-branch block: Secondary | ICD-10-CM | POA: Diagnosis not present

## 2016-09-06 DIAGNOSIS — N183 Chronic kidney disease, stage 3 (moderate): Secondary | ICD-10-CM | POA: Diagnosis not present

## 2016-09-06 DIAGNOSIS — E785 Hyperlipidemia, unspecified: Secondary | ICD-10-CM | POA: Diagnosis present

## 2016-09-06 DIAGNOSIS — I480 Paroxysmal atrial fibrillation: Secondary | ICD-10-CM | POA: Diagnosis present

## 2016-09-06 DIAGNOSIS — D62 Acute posthemorrhagic anemia: Secondary | ICD-10-CM | POA: Diagnosis present

## 2016-09-06 DIAGNOSIS — Z9289 Personal history of other medical treatment: Secondary | ICD-10-CM

## 2016-09-06 DIAGNOSIS — Z79899 Other long term (current) drug therapy: Secondary | ICD-10-CM

## 2016-09-06 DIAGNOSIS — I2510 Atherosclerotic heart disease of native coronary artery without angina pectoris: Secondary | ICD-10-CM | POA: Diagnosis present

## 2016-09-06 DIAGNOSIS — K922 Gastrointestinal hemorrhage, unspecified: Secondary | ICD-10-CM | POA: Diagnosis not present

## 2016-09-06 DIAGNOSIS — J449 Chronic obstructive pulmonary disease, unspecified: Secondary | ICD-10-CM | POA: Diagnosis present

## 2016-09-06 DIAGNOSIS — Z9989 Dependence on other enabling machines and devices: Secondary | ICD-10-CM

## 2016-09-06 DIAGNOSIS — Z8249 Family history of ischemic heart disease and other diseases of the circulatory system: Secondary | ICD-10-CM

## 2016-09-06 DIAGNOSIS — R55 Syncope and collapse: Secondary | ICD-10-CM | POA: Diagnosis not present

## 2016-09-06 DIAGNOSIS — L539 Erythematous condition, unspecified: Secondary | ICD-10-CM | POA: Diagnosis not present

## 2016-09-06 DIAGNOSIS — M1000 Idiopathic gout, unspecified site: Secondary | ICD-10-CM | POA: Diagnosis present

## 2016-09-06 DIAGNOSIS — Z87891 Personal history of nicotine dependence: Secondary | ICD-10-CM

## 2016-09-06 DIAGNOSIS — Z951 Presence of aortocoronary bypass graft: Secondary | ICD-10-CM

## 2016-09-06 DIAGNOSIS — K5730 Diverticulosis of large intestine without perforation or abscess without bleeding: Secondary | ICD-10-CM | POA: Diagnosis present

## 2016-09-06 DIAGNOSIS — K649 Unspecified hemorrhoids: Secondary | ICD-10-CM | POA: Diagnosis not present

## 2016-09-06 DIAGNOSIS — K644 Residual hemorrhoidal skin tags: Secondary | ICD-10-CM | POA: Diagnosis not present

## 2016-09-06 DIAGNOSIS — K3189 Other diseases of stomach and duodenum: Secondary | ICD-10-CM | POA: Diagnosis not present

## 2016-09-06 DIAGNOSIS — D649 Anemia, unspecified: Secondary | ICD-10-CM | POA: Diagnosis not present

## 2016-09-06 DIAGNOSIS — R404 Transient alteration of awareness: Secondary | ICD-10-CM | POA: Diagnosis not present

## 2016-09-06 DIAGNOSIS — R0609 Other forms of dyspnea: Secondary | ICD-10-CM

## 2016-09-06 DIAGNOSIS — K219 Gastro-esophageal reflux disease without esophagitis: Secondary | ICD-10-CM | POA: Diagnosis not present

## 2016-09-06 DIAGNOSIS — I4891 Unspecified atrial fibrillation: Secondary | ICD-10-CM | POA: Diagnosis present

## 2016-09-06 DIAGNOSIS — I110 Hypertensive heart disease with heart failure: Secondary | ICD-10-CM | POA: Diagnosis not present

## 2016-09-06 DIAGNOSIS — R9431 Abnormal electrocardiogram [ECG] [EKG]: Secondary | ICD-10-CM | POA: Diagnosis not present

## 2016-09-06 HISTORY — DX: Personal history of other infectious and parasitic diseases: Z86.19

## 2016-09-06 HISTORY — DX: Personal history of other medical treatment: Z92.89

## 2016-09-06 HISTORY — DX: Unspecified atrial fibrillation: I48.91

## 2016-09-06 HISTORY — DX: Gastro-esophageal reflux disease without esophagitis: K21.9

## 2016-09-06 HISTORY — DX: Pure hypercholesterolemia, unspecified: E78.00

## 2016-09-06 HISTORY — DX: Anemia, unspecified: D64.9

## 2016-09-06 HISTORY — DX: Acute embolism and thrombosis of unspecified deep veins of unspecified lower extremity: I82.409

## 2016-09-06 HISTORY — DX: Unspecified osteoarthritis, unspecified site: M19.90

## 2016-09-06 HISTORY — DX: Chronic kidney disease, stage 3 (moderate): N18.3

## 2016-09-06 HISTORY — DX: Chronic kidney disease, stage 3 unspecified: N18.30

## 2016-09-06 LAB — COMPREHENSIVE METABOLIC PANEL
ALT: 17 U/L (ref 17–63)
AST: 21 U/L (ref 15–41)
Albumin: 3 g/dL — ABNORMAL LOW (ref 3.5–5.0)
Alkaline Phosphatase: 49 U/L (ref 38–126)
Anion gap: 6 (ref 5–15)
BUN: 54 mg/dL — ABNORMAL HIGH (ref 6–20)
CO2: 21 mmol/L — ABNORMAL LOW (ref 22–32)
Calcium: 8.2 mg/dL — ABNORMAL LOW (ref 8.9–10.3)
Chloride: 111 mmol/L (ref 101–111)
Creatinine, Ser: 2.11 mg/dL — ABNORMAL HIGH (ref 0.61–1.24)
GFR calc Af Amer: 33 mL/min — ABNORMAL LOW (ref >60)
GFR calc non Af Amer: 28 mL/min — ABNORMAL LOW (ref >60)
Glucose, Bld: 130 mg/dL — ABNORMAL HIGH (ref 65–99)
Potassium: 4.4 mmol/L (ref 3.5–5.1)
Sodium: 138 mmol/L (ref 135–145)
Total Bilirubin: 0.4 mg/dL (ref 0.3–1.2)
Total Protein: 5.1 g/dL — ABNORMAL LOW (ref 6.5–8.1)

## 2016-09-06 LAB — CBC WITH DIFFERENTIAL/PLATELET
Basophils Absolute: 0 10*3/uL (ref 0.0–0.1)
Basophils Relative: 0 %
Eosinophils Absolute: 0.1 10*3/uL (ref 0.0–0.7)
Eosinophils Relative: 1 %
HCT: 17.9 % — ABNORMAL LOW (ref 39.0–52.0)
Hemoglobin: 5.6 g/dL — CL (ref 13.0–17.0)
Lymphocytes Relative: 11 %
Lymphs Abs: 0.9 10*3/uL (ref 0.7–4.0)
MCH: 28.6 pg (ref 26.0–34.0)
MCHC: 31.3 g/dL (ref 30.0–36.0)
MCV: 91.3 fL (ref 78.0–100.0)
Monocytes Absolute: 0.3 10*3/uL (ref 0.1–1.0)
Monocytes Relative: 4 %
Neutro Abs: 6.8 10*3/uL (ref 1.7–7.7)
Neutrophils Relative %: 84 %
Platelets: 212 10*3/uL (ref 150–400)
RBC: 1.96 MIL/uL — ABNORMAL LOW (ref 4.22–5.81)
RDW: 16.6 % — ABNORMAL HIGH (ref 11.5–15.5)
WBC: 8.1 10*3/uL (ref 4.0–10.5)

## 2016-09-06 LAB — I-STAT CHEM 8, ED
BUN: 53 mg/dL — ABNORMAL HIGH (ref 6–20)
Calcium, Ion: 1.02 mmol/L — ABNORMAL LOW (ref 1.15–1.40)
Chloride: 107 mmol/L (ref 101–111)
Creatinine, Ser: 2.1 mg/dL — ABNORMAL HIGH (ref 0.61–1.24)
Glucose, Bld: 123 mg/dL — ABNORMAL HIGH (ref 65–99)
HCT: 18 % — ABNORMAL LOW (ref 39.0–52.0)
Hemoglobin: 6.1 g/dL — CL (ref 13.0–17.0)
Potassium: 4.2 mmol/L (ref 3.5–5.1)
Sodium: 138 mmol/L (ref 135–145)
TCO2: 18 mmol/L — ABNORMAL LOW (ref 22–32)

## 2016-09-06 LAB — CBC
HCT: 17.3 % — ABNORMAL LOW (ref 39.0–52.0)
Hemoglobin: 5.3 g/dL — CL (ref 13.0–17.0)
MCH: 27.9 pg (ref 26.0–34.0)
MCHC: 30.6 g/dL (ref 30.0–36.0)
MCV: 91.1 fL (ref 78.0–100.0)
Platelets: 213 10*3/uL (ref 150–400)
RBC: 1.9 MIL/uL — ABNORMAL LOW (ref 4.22–5.81)
RDW: 16.3 % — ABNORMAL HIGH (ref 11.5–15.5)
WBC: 7.7 10*3/uL (ref 4.0–10.5)

## 2016-09-06 LAB — ABO/RH: ABO/RH(D): O POS

## 2016-09-06 LAB — I-STAT TROPONIN, ED: Troponin i, poc: 0.01 ng/mL (ref 0.00–0.08)

## 2016-09-06 LAB — PROTIME-INR
INR: 2.31
Prothrombin Time: 25.2 seconds — ABNORMAL HIGH (ref 11.4–15.2)

## 2016-09-06 LAB — URINALYSIS, ROUTINE W REFLEX MICROSCOPIC
Bilirubin Urine: NEGATIVE
Glucose, UA: NEGATIVE mg/dL
Hgb urine dipstick: NEGATIVE
Ketones, ur: NEGATIVE mg/dL
Leukocytes, UA: NEGATIVE
Nitrite: NEGATIVE
Protein, ur: NEGATIVE mg/dL
Specific Gravity, Urine: 1.009 (ref 1.005–1.030)
pH: 5 (ref 5.0–8.0)

## 2016-09-06 LAB — APTT: aPTT: 24 seconds (ref 24–36)

## 2016-09-06 LAB — POC OCCULT BLOOD, ED: Fecal Occult Bld: POSITIVE — AB

## 2016-09-06 LAB — PREPARE RBC (CROSSMATCH)

## 2016-09-06 MED ORDER — SODIUM CHLORIDE 0.9 % IV BOLUS (SEPSIS)
1000.0000 mL | Freq: Once | INTRAVENOUS | Status: AC
  Administered 2016-09-06: 1000 mL via INTRAVENOUS

## 2016-09-06 MED ORDER — ONDANSETRON HCL 4 MG/2ML IJ SOLN: 4.0000 mg | Freq: Four times a day (QID) | INTRAMUSCULAR | Status: DC | PRN

## 2016-09-06 MED ORDER — HYDROCODONE-ACETAMINOPHEN 5-325 MG PO TABS: 1.0000 | ORAL_TABLET | ORAL | Status: DC | PRN

## 2016-09-06 MED ORDER — SODIUM CHLORIDE 0.9 % IV SOLN
80.0000 mg | Freq: Once | INTRAVENOUS | Status: AC
  Filled 2016-09-06 (×2): qty 80

## 2016-09-06 MED ORDER — ALLOPURINOL 300 MG PO TABS
300.0000 mg | ORAL_TABLET | Freq: Every day | ORAL | Status: DC
  Administered 2016-09-07 – 2016-09-09 (×3): 300 mg via ORAL
  Filled 2016-09-06 (×3): qty 1

## 2016-09-06 MED ORDER — METOPROLOL SUCCINATE ER 50 MG PO TB24
50.0000 mg | ORAL_TABLET | Freq: Two times a day (BID) | ORAL | Status: DC
  Administered 2016-09-07 – 2016-09-09 (×5): 50 mg via ORAL
  Filled 2016-09-06 (×5): qty 1

## 2016-09-06 MED ORDER — ACETAMINOPHEN 325 MG PO TABS: 650.0000 mg | ORAL_TABLET | Freq: Four times a day (QID) | ORAL | Status: DC | PRN

## 2016-09-06 MED ORDER — SODIUM CHLORIDE 0.9% FLUSH
3.0000 mL | Freq: Two times a day (BID) | INTRAVENOUS | Status: DC
  Administered 2016-09-06 – 2016-09-08 (×3): 3 mL via INTRAVENOUS

## 2016-09-06 MED ORDER — ACETAMINOPHEN 650 MG RE SUPP: 650.0000 mg | Freq: Four times a day (QID) | RECTAL | Status: DC | PRN

## 2016-09-06 MED ORDER — MELATONIN 3 MG PO TABS
6.0000 mg | ORAL_TABLET | Freq: Every day | ORAL | Status: DC
  Administered 2016-09-06 – 2016-09-08 (×3): 6 mg via ORAL
  Filled 2016-09-06 (×3): qty 2

## 2016-09-06 MED ORDER — SENNA 8.6 MG PO TABS
1.0000 | ORAL_TABLET | Freq: Two times a day (BID) | ORAL | Status: DC
  Administered 2016-09-06 – 2016-09-07 (×2): 8.6 mg via ORAL
  Filled 2016-09-06 (×4): qty 1

## 2016-09-06 MED ORDER — FENOFIBRATE 54 MG PO TABS
54.0000 mg | ORAL_TABLET | Freq: Every day | ORAL | Status: DC
  Administered 2016-09-07 – 2016-09-09 (×3): 54 mg via ORAL
  Filled 2016-09-06 (×3): qty 1

## 2016-09-06 MED ORDER — ATORVASTATIN CALCIUM 10 MG PO TABS
10.0000 mg | ORAL_TABLET | Freq: Every day | ORAL | Status: DC
  Administered 2016-09-07 – 2016-09-08 (×2): 10 mg via ORAL
  Filled 2016-09-06 (×2): qty 1

## 2016-09-06 MED ORDER — TAMSULOSIN HCL 0.4 MG PO CAPS
0.4000 mg | ORAL_CAPSULE | Freq: Every day | ORAL | Status: DC
  Administered 2016-09-07 – 2016-09-09 (×3): 0.4 mg via ORAL
  Filled 2016-09-06 (×3): qty 1

## 2016-09-06 MED ORDER — SODIUM CHLORIDE 0.9 % IV SOLN
8.0000 mg/h | INTRAVENOUS | Status: AC
  Administered 2016-09-06 – 2016-09-09 (×6): 8 mg/h via INTRAVENOUS
  Filled 2016-09-06 (×10): qty 80

## 2016-09-06 MED ORDER — SODIUM CHLORIDE 0.9 % IV SOLN
INTRAVENOUS | Status: DC
  Administered 2016-09-07 – 2016-09-09 (×3): via INTRAVENOUS

## 2016-09-06 MED ORDER — SODIUM CHLORIDE 0.9 % IV SOLN
10.0000 mL/h | Freq: Once | INTRAVENOUS | Status: AC
  Administered 2016-09-07: 10 mL/h via INTRAVENOUS

## 2016-09-06 MED ORDER — ONDANSETRON HCL 4 MG PO TABS: 4.0000 mg | ORAL_TABLET | Freq: Four times a day (QID) | ORAL | Status: DC | PRN

## 2016-09-06 MED ORDER — FUROSEMIDE 20 MG PO TABS
20.0000 mg | ORAL_TABLET | Freq: Every day | ORAL | Status: DC
  Administered 2016-09-07 – 2016-09-09 (×3): 20 mg via ORAL
  Filled 2016-09-06 (×3): qty 1

## 2016-09-06 NOTE — ED Triage Notes (Signed)
Pt arrived from home via [facility] reporting dizziness with LOC around 1130 after going from sitting to standing, denies CP, SOB. Pt's family reports pt diaphoretic, incontinent of urine.  Family reports pt was lowered onto sofa, no head trauma or injury noted.  Pt reports "real dark brown" stools since started on Xarelto. Pt AOx4, NAD noted at this time.

## 2016-09-06 NOTE — Consult Note (Signed)
Referring Provider:  Dr. Oleta Mouse ED  Primary Care Physician:  [name], MD Primary Gastroenterologist:  Dr. Michail Sermon   Reason for Consultation:  GI bleed, acute blood loss anemia  HPI: Paul Price is a 76 y.o. male with past medical history of coronary artery disease status post CABG, chronic kidney disease,  history of DVT, new onset Afib earlier this month and was started on Xarelto on 08/10/2016. Patient presented to the hospital with dizziness and syncope. He was found to have significant anemia with hemoglobin of 5.3 on admission. Hemoglobin earlier this month was normal at 12.3. GI is consulted for further evaluation.  Patient seen and examined in the ER. Family at bedside. Apparently patient has been having dizziness since last 2-3 weeks. His blood pressure medication was reduced but he continued to have dizziness. Patient has also noted intermittent black stool since last 1 week. Last bowel movement was this morning and it was brown in color. Denied any bright blood per rectum. Denied abdominal pain. Denied any other GI symptoms. Denied NSAID use.  Last colonoscopy in July 2015 by Dr. Michail Sermon showed 4 small tubular adenoma. Repeat was recommended in 3 years.   Past Medical History:  Diagnosis Date  . Atrial fibrillation ([place])   . Blood clot in vein 12/2013   Left leg  . BPH (benign prostatic hyperplasia)   . Coronary artery disease   . DDD (degenerative disc disease), cervical   . Gout   . History of percutaneous left heart catheterization 04/2010  . History of stress test 06/2011   No significant ischemia, this is a low risk scan. Clinical correlation recommended Abnormal myocardial perfusion study.  Marland Kitchen Hx of echocardiogram 05/2009   EF 40-45%, he did have mild annular calcification with mild-to-moderate MR and mild TR as well as aortic valve sclerosis. Estimated RV systolic pressure was 21 mm.  . Hypertension   . Kidney failure   . 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 Neck pain   . Obesity   . OSA on CPAP   . RBBB   . Renal insufficiency   . Spinal stenosis of lumbar region     Past Surgical History:  Procedure Laterality Date  . CARDIAC CATHETERIZATION  2009   he was found to have a atretic LIMA graft to his LAD and had a patent vein graft supplying his diagonal vessel. At that time, he had 70% narrowing in his LAD beyond a diagonal vessel which was initially treated medically.  . CORONARY ANGIOPLASTY WITH STENT PLACEMENT  April 2012   LAD DES  . CORONARY ARTERY BYPASS GRAFT  1992   with LIMA to the LAD and diagonal.    Prior to Admission medications   Medication Sig Start Date End Date Taking? Authorizing Provider  allopurinol (ZYLOPRIM) 300 MG tablet Take 300 mg by mouth daily.   Yes [provider]  aspirin 81 MG tablet Take 81 mg by mouth daily.   Yes [provider]  atorvastatin (LIPITOR) 10 MG tablet Take 10 mg by mouth daily.   Yes [provider]  esomeprazole (NEXIUM) 20 MG capsule Take 20 mg by mouth daily at 12 noon.   Yes [provider]  fenofibrate micronized (LOFIBRA) 134 MG capsule Take 134 mg by mouth daily before breakfast.   Yes [provider]  furosemide (LASIX) 40 MG tablet Take 20 mg by mouth daily.   Yes [provider]  Melatonin 5 MG TABS Take 5 mg  by mouth at bedtime.   Yes [provider]  metoprolol succinate (TOPROL-XL) 50 MG 24 hr tablet Take 50 mg by mouth 2 (two) times daily. Take with or immediately following a meal.   Yes [provider]  Rivaroxaban (XARELTO) 15 MG TABS tablet Take 1 tablet (15 mg total) by mouth daily with supper. 08/10/16  Yes Hosie Poisson, MD  Tamsulosin HCl (FLOMAX) 0.4 MG CAPS Take 1 capsule (0.4 mg total) by mouth daily. 07/20/11  Yes Hunt, Meagan, MD  UNABLE TO FIND CPAP: At bedtime   Yes [provider]  clopidogrel (PLAVIX) 75 MG tablet TAKE 1 TABLET BY MOUTH DAILY Patient not taking: Reported on  09/06/2016 05/01/13   Troy Sine, MD    Scheduled Meds: Continuous Infusions: . sodium chloride    . pantoprazole (PROTONIX) IVPB    . pantoprozole (PROTONIX) infusion    . sodium chloride     PRN Meds:.  Allergies as of 09/06/2016 - Review Complete 09/06/2016  Allergen Reaction Noted  . Nitrostat [nitroglycerin] Other (See Comments) 08/20/2012    Family History  Problem Relation Age of Onset  . Heart disease Father     Social History   Social History  . Marital status: Married    Spouse name: N/A  . Number of children: N/A  . Years of education: N/A   Occupational History  . Not on file.   Social History Main Topics  . Smoking status: Former Research scientist (life sciences)  . Smokeless tobacco: Never Used     Comment: quit ~ 34 years ago  . Alcohol use No  . Drug use: No  . Sexual activity: Not on file   Other Topics Concern  . Not on file   Social History Narrative  . No narrative on file    Review of Systems: Review of Systems  Constitutional: Positive for malaise/fatigue. Negative for chills and fever.  HENT: Negative for hearing loss and tinnitus.   Eyes: Negative for blurred vision and double vision.  Respiratory: Positive for shortness of breath. Negative for cough and hemoptysis.   Cardiovascular: Negative for chest pain and palpitations.  Gastrointestinal: Positive for melena. Negative for abdominal pain, constipation, diarrhea, heartburn, nausea and vomiting.  Genitourinary: Negative for dysuria and urgency.  Musculoskeletal: Negative for myalgias and neck pain.  Skin: Negative for itching and rash.  Neurological: Positive for dizziness and weakness. Negative for seizures.  Endo/Heme/Allergies: Does not bruise/bleed easily.  Psychiatric/Behavioral: Negative for hallucinations and suicidal ideas.    Physical Exam: Vital signs: Vitals:   09/06/16 1254 09/06/16 1300  BP: (!) 128/55 (!) 122/55  Pulse: 69 66  Resp: 16 18  Temp: 98.3 F (36.8 C)   SpO2: 99% 100%      Physical Exam  Constitutional: He is oriented to person, place, and time. He appears well-developed and well-nourished.  HENT:  Head: Normocephalic and atraumatic.  Mouth/Throat: Oropharynx is clear and moist.  Eyes: EOM are normal. No scleral icterus.  Neck: Normal range of motion. Neck supple. No thyromegaly present.  Cardiovascular:  No murmur heard. Irregular rate and rhythm  Pulmonary/Chest: Effort normal and breath sounds normal. No respiratory distress.  Abdominal: Soft. Bowel sounds are normal. He exhibits no distension. There is no tenderness. There is no rebound and no guarding.  Musculoskeletal: Normal range of motion. He exhibits no edema.  Neurological: He is alert and oriented to person, place, and time.  Skin: Skin is warm. No erythema.  Psychiatric: He has a normal mood and  affect. His behavior is normal. Judgment normal.  Vitals reviewed.   GI:  Lab Results:  Recent Labs  09/06/16 1300 09/06/16 1405 09/06/16 1418  WBC 7.7 8.1  --   HGB 5.3* 5.6* 6.1*  HCT 17.3* 17.9* 18.0*  PLT 213 212  --    BMET  Recent Labs  09/06/16 1314 09/06/16 1418  NA 138 138  K 4.4 4.2  CL 111 107  CO2 21*  --   GLUCOSE 130* 123*  BUN 54* 53*  CREATININE 2.11* 2.10*  CALCIUM 8.2*  --    LFT  Recent Labs  09/06/16 1314  PROT 5.1*  ALBUMIN 3.0*  AST 21  ALT 17  ALKPHOS 49  BILITOT 0.4   PT/INR  Recent Labs  09/06/16 1405  LABPROT 25.2*  INR 2.31     Studies/Results: No results found.  Impression/Plan: - Symptomatic anemia with hemoglobin of 5.3 on admission. Hemoglobin was normal earlier this month. - Melena. - Atrial fibrillation. Was recently started on Xarelto . Last dose last night. - Chronic kidney disease. - Personal history of tubular adenomatous polyp. He is due for his colonoscopy.  Recommendations --------------------------- - Transfuse to keep hemoglobin around  8 given his underlying coronary artery disease. - Continue PPI  drip. Monitor H&H. - Patient with chronic kidney disease has reduced clearance of Xarelto. -  Tentatively plan for EGD on Friday or Saturday. - Patient had brown color stool this morning. He is not having any active GI bleed at this time. Okay to have diet from GI standpoint. - If EGD is negative, recommend colonoscopy for further evaluation as patient is due for his colonoscopy. - GI will follow.   LOS: 0 days   Otis Brace  MD, FACP 09/06/2016, 3:20 PM  Pager [phone] If no answer or after 5 PM call [phone]

## 2016-09-06 NOTE — Progress Notes (Signed)
Patient arrived from the ED to 4E room 6.  Telemetry monitor was placed and CCMD notified.  Patient oriented to unit and room to include call light and phone.  Will continue to monitor.

## 2016-09-06 NOTE — ED Provider Notes (Addendum)
[facility] DEPT Provider Note   CSN: [id number] Arrival date & time: 09/06/16  1238     History   Chief Complaint Chief Complaint  Patient presents with  . Loss of Consciousness    HPI Paul Price is a 76 y.o. male.  The history is provided by the patient.  Loss of Consciousness   This is a new problem. The current episode started less than 1 hour ago. The problem occurs rarely. The problem has been resolved. He lost consciousness for a period of less than one minute. The problem is associated with sitting up and standing up. Associated symptoms include bladder incontinence and light-headedness. Pertinent negatives include abdominal pain, back pain, bowel incontinence, chest pain, confusion, fever, focal sensory loss, focal weakness, headaches, nausea, seizures, slurred speech and vomiting. He has tried nothing for the symptoms. His past medical history is significant for CAD.    Past Medical History:  Diagnosis Date  . Atrial fibrillation ([place])   . Blood clot in vein 12/2013   Left leg  . BPH (benign prostatic hyperplasia)   . Coronary artery disease   . DDD (degenerative disc disease), cervical   . Gout   . History of percutaneous left heart catheterization 04/2010  . History of stress test 06/2011   No significant ischemia, this is a low risk scan. Clinical correlation recommended Abnormal myocardial perfusion study.  Marland Kitchen Hx of echocardiogram 05/2009   EF 40-45%, he did have mild annular calcification with mild-to-moderate MR and mild TR as well as aortic valve sclerosis. Estimated RV systolic pressure was 21 mm.  . Hypertension   . Kidney failure   . 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 Neck pain   . Obesity   . OSA on CPAP   . RBBB   . Renal insufficiency   . Spinal stenosis of lumbar region     Patient Active Problem List   Diagnosis Date Noted  . New onset a-fib ([place]) 08/08/2016  . Chronic diastolic (congestive) heart failure ([place]) 08/08/2016    . Hypotension 08/08/2016  . A-fib ([place]) 08/08/2016  . Primary gout   . 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 Syncope 12/02/2013  . Left leg DVT ([place]) 12/02/2013  . Fever 12/02/2013  . AKI (acute kidney injury) ([place]) 12/02/2013  . Renal insufficiency 07/02/2013  . PAF (paroxysmal atrial fibrillation) ([place]) 01/04/2013  . Sinus pause, 4.3 seconds.  Recorded by [facility]. 12/02/2012  . NSVT (nonsustained ventricular tachycardia) ([place]) 12/02/2012  . Dyspnea on exertion 11/20/2012  . 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 HTN (hypertension) 08/20/2012  . Hyperlipidemia with target LDL less than 70 08/20/2012  . Obesity (BMI 30-39.9) 08/20/2012  . OSA on CPAP 08/20/2012  . RBBB 08/20/2012    Past Surgical History:  Procedure Laterality Date  . CARDIAC CATHETERIZATION  2009   he was found to have a atretic LIMA graft to his LAD and had a patent vein graft supplying his diagonal vessel. At that time, he had 70% narrowing in his LAD beyond a diagonal vessel which was initially treated medically.  . CORONARY ANGIOPLASTY WITH STENT PLACEMENT  April 2012   LAD DES  . CORONARY ARTERY BYPASS GRAFT  1992   with LIMA to the LAD and diagonal.       Home Medications    Prior to Admission medications   Medication Sig Start Date End Date Taking? Authorizing Provider  allopurinol (ZYLOPRIM) 300 MG tablet Take  300 mg by mouth daily.   Yes [provider]  aspirin 81 MG tablet Take 81 mg by mouth daily.   Yes [provider]  atorvastatin (LIPITOR) 10 MG tablet Take 10 mg by mouth daily.   Yes [provider]  esomeprazole (NEXIUM) 20 MG capsule Take 20 mg by mouth daily at 12 noon.   Yes [provider]  fenofibrate micronized (LOFIBRA) 134 MG capsule Take 134 mg by mouth daily before breakfast.   Yes [provider]  furosemide (LASIX) 40 MG tablet Take 20 mg by mouth daily.   Yes [provider]  Melatonin 5 MG TABS Take  5 mg by mouth at bedtime.   Yes [provider]  metoprolol succinate (TOPROL-XL) 50 MG 24 hr tablet Take 50 mg by mouth 2 (two) times daily. Take with or immediately following a meal.   Yes [provider]  Rivaroxaban (XARELTO) 15 MG TABS tablet Take 1 tablet (15 mg total) by mouth daily with supper. 08/10/16  Yes Hosie Poisson, MD  Tamsulosin HCl (FLOMAX) 0.4 MG CAPS Take 1 capsule (0.4 mg total) by mouth daily. 07/20/11  Yes Hunt, Meagan, MD  UNABLE TO FIND CPAP: At bedtime   Yes [provider]  clopidogrel (PLAVIX) 75 MG tablet TAKE 1 TABLET BY MOUTH DAILY Patient not taking: Reported on 09/06/2016 05/01/13   Troy Sine, MD    Family History Family History  Problem Relation Age of Onset  . Heart disease Father     Social History Social History  Substance Use Topics  . Smoking status: Former Research scientist (life sciences)  . Smokeless tobacco: Never Used     Comment: quit ~ 34 years ago  . Alcohol use No     Allergies   Nitrostat [nitroglycerin]   Review of Systems Review of Systems  Constitutional: Negative for fever.  Cardiovascular: Positive for syncope. Negative for chest pain.  Gastrointestinal: Negative for abdominal pain, bowel incontinence, nausea and vomiting.  Genitourinary: Positive for bladder incontinence.  Musculoskeletal: Negative for back pain.  Neurological: Positive for light-headedness. Negative for focal weakness, seizures and headaches.  Psychiatric/Behavioral: Negative for confusion.  All other systems reviewed and are negative.    Physical Exam Updated Vital Signs BP (!) 122/55   Pulse 66   Temp 98.3 F (36.8 C) (Oral)   Resp 18   Ht 5\' 6"  (1.676 m)   Wt 90.3 kg (199 lb)   SpO2 100%   BMI 32.12 kg/m   Physical Exam Physical Exam  Nursing note and vitals reviewed. Constitutional: Well developed, well nourished, non-toxic, and in no acute distress Head: Normocephalic and atraumatic.  Mouth/Throat: Oropharynx is clear and moist.    Eyes: Conjunctival pallor Neck: Normal range of motion. Neck supple.  Cardiovascular: Normal rate and regular rhythm.   Pulmonary/Chest: Effort normal and breath sounds normal.  Abdominal: Soft. There is no tenderness. There is no rebound and no guarding.  melanotic stool on rectal exam  Musculoskeletal: Normal range of motion.  Neurological: Alert, no facial droop, fluent speech, moves all extremities symmetrically, sensation to light touch in tact throughout Skin: Skin is warm and dry.  Psychiatric: Cooperative   ED Treatments / Results  Labs (all labs ordered are listed, but only abnormal results are displayed) Labs Reviewed  CBC - Abnormal; Notable for the following:       Result Value   RBC 1.90 (*)    Hemoglobin 5.3 (*)    HCT 17.3 (*)  RDW 16.3 (*)    All other components within normal limits  COMPREHENSIVE METABOLIC PANEL - Abnormal; Notable for the following:    CO2 21 (*)    Glucose, Bld 130 (*)    BUN 54 (*)    Creatinine, Ser 2.11 (*)    Calcium 8.2 (*)    Total Protein 5.1 (*)    Albumin 3.0 (*)    GFR calc non Af Amer 28 (*)    GFR calc Af Amer 33 (*)    All other components within normal limits  CBC WITH DIFFERENTIAL/PLATELET - Abnormal; Notable for the following:    RBC 1.96 (*)    Hemoglobin 5.6 (*)    HCT 17.9 (*)    RDW 16.6 (*)    All other components within normal limits  PROTIME-INR - Abnormal; Notable for the following:    Prothrombin Time 25.2 (*)    All other components within normal limits  POC OCCULT BLOOD, ED - Abnormal; Notable for the following:    Fecal Occult Bld POSITIVE (*)    All other components within normal limits  I-STAT CHEM 8, ED - Abnormal; Notable for the following:    BUN 53 (*)    Creatinine, Ser 2.10 (*)    Glucose, Bld 123 (*)    Calcium, Ion 1.02 (*)    TCO2 18 (*)    Hemoglobin 6.1 (*)    HCT 18.0 (*)    All other components within normal limits  APTT  URINALYSIS, ROUTINE W REFLEX MICROSCOPIC  CBG MONITORING,  ED  I-STAT TROPONIN, ED  TYPE AND SCREEN  PREPARE RBC (CROSSMATCH)  ABO/RH    EKG  EKG Interpretation None       Radiology No results found.  Procedures Procedures (including critical care time) CRITICAL CARE Performed by: Forde Dandy   Total critical care time: 40 minutes  Critical care time was exclusive of separately billable procedures and treating other patients.  Critical care was necessary to treat or prevent imminent or life-threatening deterioration.  Critical care was time spent personally by me on the following activities: development of treatment plan with patient and/or surrogate as well as nursing, discussions with consultants, evaluation of patient's response to treatment, examination of patient, obtaining history from patient or surrogate, ordering and performing treatments and interventions, ordering and review of laboratory studies, ordering and review of radiographic studies, pulse oximetry and re-evaluation of patient's condition.  Medications Ordered in ED Medications  sodium chloride 0.9 % bolus 1,000 mL (not administered)  pantoprazole (PROTONIX) 80 mg in sodium chloride 0.9 % 100 mL IVPB (not administered)  pantoprazole (PROTONIX) 80 mg in sodium chloride 0.9 % 250 mL (0.32 mg/mL) infusion (not administered)  0.9 %  sodium chloride infusion (not administered)     Initial Impression / Assessment and Plan / ED Course  I have reviewed the triage vital signs and the nursing notes.  Pertinent labs & imaging results that were available during my care of the patient were reviewed by me and considered in my medical decision making (see chart for details).     76 year old male with history of CAD status post CABG, paroxysmal atrial fibrillation on Xarelto, diastolic heart failure, hypertension hyperlipidemia who presents with syncopal episode. Was very orthostatic prior to arrival per EMS. Records reviewed, d/c 8/2 for PAF and discharged with  Xarelto.  Presentation concerning for upper GI bleed, as he has melena on exam. Acute blood loss anemia of 5, and will be transfused 2  units of blood. Has an elevated BUN/creatinine ratio. We'll discuss with equal GI as he has had previous colonoscopy by Dr. Michail Sermon. No liver problems or EtOH dependence. Placed on protonix drip. Last dose Xarelto, last night  Spoke with [facility], who will consult. Admit to hospitalist service.   Final Clinical Impressions(s) / ED Diagnoses   Final diagnoses:  Acute blood loss anemia  Upper GI bleed    New Prescriptions New Prescriptions   No medications on file     Forde Dandy, MD 09/06/16 1521    Forde Dandy, MD 09/06/16 1524

## 2016-09-06 NOTE — ED Notes (Signed)
2 units of blood ready. Santiago Glad RN made aware @ 1510.

## 2016-09-06 NOTE — H&P (Signed)
History and Physical    Paul Price TFT:[id number] DOB: 1940-07-31 DOA: 09/06/2016   PCP: [name], MD   Patient coming from:  Home    Chief Complaint: Syncope   HPI: Paul Price is a 76 y.o. male with medical history significant for BPH, OSA on CPAP, CAD status post CABG, COPD, history of DVT several years ago, hypertension, hyperlipidemia,CKD   New diagnosis PAF recently placed on Xarelto on 8/2, presenting to the hospital after experiencing the syncopal episode while at home lasting for about 30 seconds  The patient did not experienced trauma to the head  At the time, he denies any chest pain or palpitations. He denied any headaches, or vision changes prior to the event. No history of seizures. He denies any abdominal pain. Of note, he did notice dark stools over the last 2 weeks, and increased weakness and lightheadedness over the same period of time. She denies any dysuria or hematuria. The patient denies any NSAID use. His last colonoscopy had been in July 2015 by Dr. Michail Sermon, at which time for small tubular adenoma words noted. He was due for a new endoscopy sometime later this year. He is up-to-date with his medications, and he is compliant with his visits to the cardiologist. He is due for a checkup in late September. He denies any nosebleeds, gum gum bleed epistaxis, or hemoptysis. He denies any history of liver disease, a he denies any alcohol history. No recreational drug use. He has taken his morning meds, including BB and diuretics    ED Course:  BP (!) 122/55   Pulse 66   Temp 98.3 F (36.8 C) (Oral)   Resp 18   Ht 5\' 6"  (1.676 m)   Wt 90.3 kg (199 lb)   SpO2 100%   BMI 32.12 kg/m    hemoglobin 5.3, repeat 5.6 BUN 54 creatinine 2.11 Urine CBG receiving 2 units of blood  Received Protonix 1 I LVF  GI to see   Review of Systems:  As per HPI otherwise all other systems reviewed and are negative  Past Medical History:  Diagnosis Date  . Atrial fibrillation  ([place])   . Blood clot in vein 12/2013   Left leg  . BPH (benign prostatic hyperplasia)   . Coronary artery disease   . DDD (degenerative disc disease), cervical   . Gout   . History of percutaneous left heart catheterization 04/2010  . History of stress test 06/2011   No significant ischemia, this is a low risk scan. Clinical correlation recommended Abnormal myocardial perfusion study.  Marland Kitchen Hx of echocardiogram 05/2009   EF 40-45%, he did have mild annular calcification with mild-to-moderate MR and mild TR as well as aortic valve sclerosis. Estimated RV systolic pressure was 21 mm.  . Hypertension   . Kidney failure   . 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 Neck pain   . Obesity   . OSA on CPAP   . RBBB   . Renal insufficiency   . Spinal stenosis of lumbar region     Past Surgical History:  Procedure Laterality Date  . CARDIAC CATHETERIZATION  2009   he was found to have a atretic LIMA graft to his LAD and had a patent vein graft supplying his diagonal vessel. At that time, he had 70% narrowing in his LAD beyond a diagonal vessel which was initially treated medically.  . CORONARY ANGIOPLASTY WITH STENT PLACEMENT  April 2012   LAD DES  .  CORONARY ARTERY BYPASS GRAFT  1992   with LIMA to the LAD and diagonal.    Social History Social History   Social History  . Marital status: Married    Spouse name: N/A  . Number of children: N/A  . Years of education: N/A   Occupational History  . Not on file.   Social History Main Topics  . Smoking status: Former Research scientist (life sciences)  . Smokeless tobacco: Never Used     Comment: quit ~ 34 years ago  . Alcohol use No  . Drug use: No  . Sexual activity: Not on file   Other Topics Concern  . Not on file   Social History Narrative  . No narrative on file     Allergies  Allergen Reactions  . Nitrostat [Nitroglycerin] Other (See Comments)    Causes blood pressure to "bottom out"    Family History  Problem Relation Age of Onset    . Heart disease Father       Prior to Admission medications   Medication Sig Start Date End Date Taking? Authorizing Provider  allopurinol (ZYLOPRIM) 300 MG tablet Take 300 mg by mouth daily.   Yes [provider]  aspirin 81 MG tablet Take 81 mg by mouth daily.   Yes [provider]  atorvastatin (LIPITOR) 10 MG tablet Take 10 mg by mouth daily.   Yes [provider]  esomeprazole (NEXIUM) 20 MG capsule Take 20 mg by mouth daily at 12 noon.   Yes [provider]  fenofibrate micronized (LOFIBRA) 134 MG capsule Take 134 mg by mouth daily before breakfast.   Yes [provider]  furosemide (LASIX) 40 MG tablet Take 20 mg by mouth daily.   Yes [provider]  Melatonin 5 MG TABS Take 5 mg by mouth at bedtime.   Yes [provider]  metoprolol succinate (TOPROL-XL) 50 MG 24 hr tablet Take 50 mg by mouth 2 (two) times daily. Take with or immediately following a meal.   Yes [provider]  Rivaroxaban (XARELTO) 15 MG TABS tablet Take 1 tablet (15 mg total) by mouth daily with supper. 08/10/16  Yes Hosie Poisson, MD  Tamsulosin HCl (FLOMAX) 0.4 MG CAPS Take 1 capsule (0.4 mg total) by mouth daily. 07/20/11  Yes Hunt, Meagan, MD  UNABLE TO FIND CPAP: At bedtime   Yes [provider]  clopidogrel (PLAVIX) 75 MG tablet TAKE 1 TABLET BY MOUTH DAILY Patient not taking: Reported on 09/06/2016 05/01/13   Troy Sine, MD    Physical Exam:  Vitals:   09/06/16 1242 09/06/16 1254 09/06/16 1255 09/06/16 1300  BP:  (!) 128/55  (!) 122/55  Pulse:  69  66  Resp:  16  18  Temp:  98.3 F (36.8 C)    TempSrc:  Oral    SpO2: 100% 99%  100%  Weight:   90.3 kg (199 lb)   Height:   5\' 6"  (1.676 m)    Constitutional: NAD, calm, comfortable  Eyes: PERRL, lids and conjunctivae normal ENMT: Mucous membranes are moist, without exudate or lesions  Neck: normal, supple, no masses, no thyromegaly Respiratory: clear to auscultation  bilaterally, no wheezing, no crackles. Normal respiratory effort  Cardiovascular:irregularly irregular  and rhythm, no murmurs, rubs or gallops. No extremity edema. 2+ pedal pulses. No carotid bruits.  Abdomen: Soft, non tender, mod obese No hepatosplenomegaly. Bowel sounds positive.  Musculoskeletal: no clubbing / cyanosis. Moves all extremities Skin: no jaundice, No lesions.  Neurologic:  Sensation intact  Strength equal in all extremities Psychiatric:   Alert and oriented x 3. Normal mood.     Labs on Admission: I have personally reviewed following labs and imaging studies  CBC:  Recent Labs Lab 09/06/16 1300 09/06/16 1405 09/06/16 1418  WBC 7.7 8.1  --   NEUTROABS  --  6.8  --   HGB 5.3* 5.6* 6.1*  HCT 17.3* 17.9* 18.0*  MCV 91.1 91.3  --   PLT 213 212  --     Basic Metabolic Panel:  Recent Labs Lab 09/06/16 1314 09/06/16 1418  NA 138 138  K 4.4 4.2  CL 111 107  CO2 21*  --   GLUCOSE 130* 123*  BUN 54* 53*  CREATININE 2.11* 2.10*  CALCIUM 8.2*  --     GFR: Estimated Creatinine Clearance: 30.5 mL/min (A) (by C-G formula based on SCr of 2.1 mg/dL (H)).  Liver Function Tests:  Recent Labs Lab 09/06/16 1314  AST 21  ALT 17  ALKPHOS 49  BILITOT 0.4  PROT 5.1*  ALBUMIN 3.0*   No results for input(s): LIPASE, AMYLASE in the last 168 hours. No results for input(s): AMMONIA in the last 168 hours.  Coagulation Profile:  Recent Labs Lab 09/06/16 1405  INR 2.31    Cardiac Enzymes: No results for input(s): CKTOTAL, CKMB, CKMBINDEX, TROPONINI in the last 168 hours.  BNP (last 3 results) No results for input(s): PROBNP in the last 8760 hours.  HbA1C: No results for input(s): HGBA1C in the last 72 hours.  CBG: No results for input(s): GLUCAP in the last 168 hours.  Lipid Profile: No results for input(s): CHOL, HDL, LDLCALC, TRIG, CHOLHDL, LDLDIRECT in the last 72 hours.  Thyroid Function Tests: No results for input(s): TSH, T4TOTAL, FREET4,  T3FREE, THYROIDAB in the last 72 hours.  Anemia Panel: No results for input(s): VITAMINB12, FOLATE, FERRITIN, TIBC, IRON, RETICCTPCT in the last 72 hours.  Urine analysis:    Component Value Date/Time   COLORURINE YELLOW 12/03/2013 0921   APPEARANCEUR HAZY (A) 12/03/2013 0921   LABSPEC 1.020 12/03/2013 0921   PHURINE 5.0 12/03/2013 0921   GLUCOSEU NEGATIVE 12/03/2013 0921   HGBUR MODERATE (A) 12/03/2013 0921   BILIRUBINUR NEGATIVE 12/03/2013 0921   KETONESUR NEGATIVE 12/03/2013 0921   PROTEINUR 30 (A) 12/03/2013 0921   UROBILINOGEN 1.0 12/03/2013 0921   NITRITE NEGATIVE 12/03/2013 0921   LEUKOCYTESUR TRACE (A) 12/03/2013 0921    Sepsis Labs: @LABRCNTIP (procalcitonin:4,lacticidven:4) )No results found for this or any previous visit (from the past 240 hour(s)).   Radiological Exams on Admission: No results found.  EKG: Independently reviewed.  Assessment/Plan Active Problems:   Syncope   Hypotension   CAD - CABG '92, LAD DES 4/12, low risk Myoview 6/13   HTN (hypertension)   Hyperlipidemia with target LDL less than 70   Obesity (BMI 30-39.9)   OSA on CPAP   RBBB   Dyspnea on exertion   PAF (paroxysmal atrial fibrillation) ([facility])   Renal insufficiency   Primary gout   Chronic diastolic (congestive) heart failure ([facility])   A-fib ([facility])   Severe anemia   GIB (gastrointestinal bleeding)  Syncope in the setting of severe anemia of GIB likely due to recent anticoagulation with Xarelto (fro Afib) .Hb 5.9 with repeat 6.2 , Neuro exam unremarkable, no further syncopal episodes. Patient is receiving 2 units of blood, and one bolus of IV fluids. Vital signs stable. Afebrile. Received IV Protonix 80 mg. G.I. evaluated patient, and is planning to  perform endoscopy on Friday. Syncope order set  Hold BP meds  Stepdown tele IV fluids EKG in am CXR  Hold NSAIDS  Continue Protonix drip  CMET and serial CBC  Other plans as per GI  Patient to receive 2 U blood, may need further  blood igf HB does not improve    Atrial Fibrillation C  on anticoagulation with Xarelto. Last 2 D echo atrial fibrillation, mildly reduced systolic and EF 85-02 . EKG without changes from prior. TN neg .  Rate controlled Hold Xarelto due to GIB.  Cont other meds in am    Hypertension BP   122/55   Pulse 66  Controlled Continue home anti-hypertensive medications in am    Hyperlipidemia Continue home statins  Chronic kidney disease stage  3  baseline creatinine 1.8-2    Current Cr 2.1  Lab Results  Component Value Date   CREATININE 2.10 (H) 09/06/2016   CREATININE 2.11 (H) 09/06/2016   CREATININE 1.93 (H) 08/10/2016   IVF Repeat CMET in am  Benign prostatic hypertrophy, no acute issues Continue Flomax in am  Gout, no acute flare Continue Allopurinol     DVT prophylaxis: SCD's    Code Status:   Full      Family Communication:  Discussed with patient Disposition Plan: Expect patient to be discharged to home after condition improves Consults called:    GI per EDP  Admission status: SDU    [facility] E, PA-C [facility]   09/06/2016, 4:08 PM

## 2016-09-06 NOTE — ED Notes (Signed)
BP 125/54 -- P-- 70 laying BP 127/67 -- P -- 76 sitting  Not standing due to pt's feeling dizzy and high fall risk

## 2016-09-07 ENCOUNTER — Encounter (HOSPITAL_COMMUNITY): Payer: Self-pay | Admitting: General Practice

## 2016-09-07 LAB — CBC
HCT: 22.3 % — ABNORMAL LOW (ref 39.0–52.0)
HCT: 23.1 % — ABNORMAL LOW (ref 39.0–52.0)
HCT: 23.8 % — ABNORMAL LOW (ref 39.0–52.0)
HCT: 24.7 % — ABNORMAL LOW (ref 39.0–52.0)
Hemoglobin: 7.1 g/dL — ABNORMAL LOW (ref 13.0–17.0)
Hemoglobin: 7.2 g/dL — ABNORMAL LOW (ref 13.0–17.0)
Hemoglobin: 7.5 g/dL — ABNORMAL LOW (ref 13.0–17.0)
Hemoglobin: 8 g/dL — ABNORMAL LOW (ref 13.0–17.0)
MCH: 28.3 pg (ref 26.0–34.0)
MCH: 27.7 pg (ref 26.0–34.0)
MCH: 28 pg (ref 26.0–34.0)
MCH: 28.8 pg (ref 26.0–34.0)
MCHC: 31.8 g/dL (ref 30.0–36.0)
MCHC: 31.2 g/dL (ref 30.0–36.0)
MCHC: 31.5 g/dL (ref 30.0–36.0)
MCHC: 32.4 g/dL (ref 30.0–36.0)
MCV: 88.8 fL (ref 78.0–100.0)
MCV: 88.8 fL (ref 78.0–100.0)
MCV: 88.8 fL (ref 78.0–100.0)
MCV: 88.8 fL (ref 78.0–100.0)
Platelets: 202 10*3/uL (ref 150–400)
Platelets: 197 10*3/uL (ref 150–400)
Platelets: 227 10*3/uL (ref 150–400)
Platelets: 201 10*3/uL (ref 150–400)
RBC: 2.51 MIL/uL — ABNORMAL LOW (ref 4.22–5.81)
RBC: 2.6 MIL/uL — ABNORMAL LOW (ref 4.22–5.81)
RBC: 2.68 MIL/uL — ABNORMAL LOW (ref 4.22–5.81)
RBC: 2.78 MIL/uL — ABNORMAL LOW (ref 4.22–5.81)
RDW: 16.5 % — ABNORMAL HIGH (ref 11.5–15.5)
RDW: 16.8 % — ABNORMAL HIGH (ref 11.5–15.5)
RDW: 16.8 % — ABNORMAL HIGH (ref 11.5–15.5)
RDW: 16.3 % — ABNORMAL HIGH (ref 11.5–15.5)
WBC: 6.7 10*3/uL (ref 4.0–10.5)
WBC: 6.6 10*3/uL (ref 4.0–10.5)
WBC: 6.9 10*3/uL (ref 4.0–10.5)
WBC: 6.5 10*3/uL (ref 4.0–10.5)

## 2016-09-07 LAB — COMPREHENSIVE METABOLIC PANEL
ALT: 16 U/L — ABNORMAL LOW (ref 17–63)
AST: 19 U/L (ref 15–41)
Albumin: 2.8 g/dL — ABNORMAL LOW (ref 3.5–5.0)
Alkaline Phosphatase: 48 U/L (ref 38–126)
Anion gap: 5 (ref 5–15)
BUN: 46 mg/dL — ABNORMAL HIGH (ref 6–20)
CO2: 23 mmol/L (ref 22–32)
Calcium: 8.2 mg/dL — ABNORMAL LOW (ref 8.9–10.3)
Chloride: 115 mmol/L — ABNORMAL HIGH (ref 101–111)
Creatinine, Ser: 2.04 mg/dL — ABNORMAL HIGH (ref 0.61–1.24)
GFR calc Af Amer: 34 mL/min — ABNORMAL LOW (ref >60)
GFR calc non Af Amer: 30 mL/min — ABNORMAL LOW (ref >60)
Glucose, Bld: 75 mg/dL (ref 65–99)
Potassium: 4.3 mmol/L (ref 3.5–5.1)
Sodium: 143 mmol/L (ref 135–145)
Total Bilirubin: 0.5 mg/dL (ref 0.3–1.2)
Total Protein: 4.9 g/dL — ABNORMAL LOW (ref 6.5–8.1)

## 2016-09-07 LAB — PREPARE RBC (CROSSMATCH)

## 2016-09-07 LAB — GLUCOSE, CAPILLARY
Glucose-Capillary: 93 mg/dL (ref 65–99)
Glucose-Capillary: 154 mg/dL — ABNORMAL HIGH (ref 65–99)

## 2016-09-07 MED ORDER — SODIUM CHLORIDE 0.9 % IV SOLN
Freq: Once | INTRAVENOUS | Status: AC
  Administered 2016-09-07: 18:00:00 via INTRAVENOUS

## 2016-09-07 MED ORDER — SODIUM CHLORIDE 0.9 % IV SOLN: INTRAVENOUS | Status: DC

## 2016-09-07 NOTE — Progress Notes (Signed)
Subjective: The patient was seen and examined at bedside. He reports that stools were not dark today. Denies abdominal pain, nausea or vomiting.  Objective: Vital signs in last 24 hours: Temp:  [97.7 F (36.5 C)-98.7 F (37.1 C)] 97.7 F (36.5 C) (08/30 0937) Pulse Rate:  [50-81] 65 (08/30 1007) Resp:  [13-36] 18 (08/30 0505) BP: (112-138)/(50-67) 138/61 (08/30 1007) SpO2:  [97 %-100 %] 100 % (08/30 0505) Weight:  [92.7 kg (204 lb 5.9 oz)] 92.7 kg (204 lb 5.9 oz) (08/30 0630) Weight change:  Last BM Date: 09/06/16  PE: Overweight, not in acute distress GENERAL: Pallor, no icterus ABDOMEN: Soft, nondistended, nontender, normoactive bowel sounds EXTREMITIES: No edema, no deformity  Lab Results: Results for orders placed or performed during the hospital encounter of 09/06/16 (from the past 48 hour(s))  CBC     Status: Abnormal   Collection Time: 09/06/16  1:00 PM  Result Value Ref Range   WBC 7.7 4.0 - 10.5 K/uL   RBC 1.90 (L) 4.22 - 5.81 MIL/uL   Hemoglobin 5.3 (LL) 13.0 - 17.0 g/dL    Comment: REPEATED TO VERIFY CRITICAL RESULT CALLED TO, READ BACK BY AND VERIFIED WITH: K COBB,RN [id number] WILDERK    HCT 17.3 (L) 39.0 - 52.0 %   MCV 91.1 78.0 - 100.0 fL   MCH 27.9 26.0 - 34.0 pg   MCHC 30.6 30.0 - 36.0 g/dL   RDW 16.3 (H) 11.5 - 15.5 %   Platelets 213 150 - 400 K/uL  Comprehensive metabolic panel     Status: Abnormal   Collection Time: 09/06/16  1:14 PM  Result Value Ref Range   Sodium 138 135 - 145 mmol/L   Potassium 4.4 3.5 - 5.1 mmol/L   Chloride 111 101 - 111 mmol/L   CO2 21 (L) 22 - 32 mmol/L   Glucose, Bld 130 (H) 65 - 99 mg/dL   BUN 54 (H) 6 - 20 mg/dL   Creatinine, Ser 2.11 (H) 0.61 - 1.24 mg/dL   Calcium 8.2 (L) 8.9 - 10.3 mg/dL   Total Protein 5.1 (L) 6.5 - 8.1 g/dL   Albumin 3.0 (L) 3.5 - 5.0 g/dL   AST 21 15 - 41 U/L   ALT 17 17 - 63 U/L   Alkaline Phosphatase 49 38 - 126 U/L   Total Bilirubin 0.4 0.3 - 1.2 mg/dL   GFR calc non Af Amer 28 (L)  >60 mL/min   GFR calc Af Amer 33 (L) >60 mL/min    Comment: (NOTE) The eGFR has been calculated using the CKD EPI equation. This calculation has not been validated in all clinical situations. eGFR's persistently <60 mL/min signify possible Chronic Kidney Disease.    Anion gap 6 5 - 15  I-Stat Troponin, ED (not at [facility])     Status: None   Collection Time: 09/06/16  1:42 PM  Result Value Ref Range   Troponin i, poc 0.01 0.00 - 0.08 ng/mL   Comment 3            Comment: Due to the release kinetics of cTnI, a negative result within the first hours of the onset of symptoms does not rule out myocardial infarction with certainty. If myocardial infarction is still suspected, repeat the test at appropriate intervals.   Type and screen [place]     Status: None   Collection Time: 09/06/16  2:00 PM  Result Value Ref Range   ABO/RH(D) O POS    Antibody Screen  NEG    Sample Expiration 09/09/2016    Unit Number [id number]    Blood Component Type RBC LR PHER1    Unit division 00    Status of Unit ISSUED,FINAL    Transfusion Status OK TO TRANSFUSE    Crossmatch Result Compatible    Unit Number [id number]    Blood Component Type RBC LR PHER2    Unit division 00    Status of Unit ISSUED,FINAL    Transfusion Status OK TO TRANSFUSE    Crossmatch Result Compatible   ABO/Rh     Status: None   Collection Time: 09/06/16  2:00 PM  Result Value Ref Range   ABO/RH(D) O POS   CBC with Differential     Status: Abnormal   Collection Time: 09/06/16  2:05 PM  Result Value Ref Range   WBC 8.1 4.0 - 10.5 K/uL   RBC 1.96 (L) 4.22 - 5.81 MIL/uL   Hemoglobin 5.6 (LL) 13.0 - 17.0 g/dL    Comment: REPEATED TO VERIFY CRITICAL VALUE NOTED.  VALUE IS CONSISTENT WITH PREVIOUSLY REPORTED AND CALLED VALUE.    HCT 17.9 (L) 39.0 - 52.0 %   MCV 91.3 78.0 - 100.0 fL   MCH 28.6 26.0 - 34.0 pg   MCHC 31.3 30.0 - 36.0 g/dL   RDW 16.6 (H) 11.5 - 15.5 %   Platelets 212 150 - 400 K/uL    Neutrophils Relative % 84 %   Neutro Abs 6.8 1.7 - 7.7 K/uL   Lymphocytes Relative 11 %   Lymphs Abs 0.9 0.7 - 4.0 K/uL   Monocytes Relative 4 %   Monocytes Absolute 0.3 0.1 - 1.0 K/uL   Eosinophils Relative 1 %   Eosinophils Absolute 0.1 0.0 - 0.7 K/uL   Basophils Relative 0 %   Basophils Absolute 0.0 0.0 - 0.1 K/uL  Protime-INR     Status: Abnormal   Collection Time: 09/06/16  2:05 PM  Result Value Ref Range   Prothrombin Time 25.2 (H) 11.4 - 15.2 seconds   INR 2.31   APTT     Status: None   Collection Time: 09/06/16  2:05 PM  Result Value Ref Range   aPTT 24 24 - 36 seconds  POC occult blood, ED     Status: Abnormal   Collection Time: 09/06/16  2:06 PM  Result Value Ref Range   Fecal Occult Bld POSITIVE (A) NEGATIVE  Prepare RBC     Status: None   Collection Time: 09/06/16  2:09 PM  Result Value Ref Range   Order Confirmation ORDER PROCESSED BY BLOOD BANK   I-Stat Chem 8, ED     Status: Abnormal   Collection Time: 09/06/16  2:18 PM  Result Value Ref Range   Sodium 138 135 - 145 mmol/L   Potassium 4.2 3.5 - 5.1 mmol/L   Chloride 107 101 - 111 mmol/L   BUN 53 (H) 6 - 20 mg/dL   Creatinine, Ser 2.10 (H) 0.61 - 1.24 mg/dL   Glucose, Bld 123 (H) 65 - 99 mg/dL   Calcium, Ion 1.02 (L) 1.15 - 1.40 mmol/L   TCO2 18 (L) 22 - 32 mmol/L   Hemoglobin 6.1 (LL) 13.0 - 17.0 g/dL   HCT 18.0 (L) 39.0 - 52.0 %   Comment NOTIFIED PHYSICIAN   Urinalysis, Routine w reflex microscopic     Status: Abnormal   Collection Time: 09/06/16  4:32 PM  Result Value Ref Range   Color, Urine STRAW (A) YELLOW   APPearance  CLEAR CLEAR   Specific Gravity, Urine 1.009 1.005 - 1.030   pH 5.0 5.0 - 8.0   Glucose, UA NEGATIVE NEGATIVE mg/dL   Hgb urine dipstick NEGATIVE NEGATIVE   Bilirubin Urine NEGATIVE NEGATIVE   Ketones, ur NEGATIVE NEGATIVE mg/dL   Protein, ur NEGATIVE NEGATIVE mg/dL   Nitrite NEGATIVE NEGATIVE   Leukocytes, UA NEGATIVE NEGATIVE  CBC     Status: Abnormal   Collection Time:  09/07/16  2:54 AM  Result Value Ref Range   WBC 6.7 4.0 - 10.5 K/uL   RBC 2.51 (L) 4.22 - 5.81 MIL/uL   Hemoglobin 7.1 (L) 13.0 - 17.0 g/dL   HCT 22.3 (L) 39.0 - 52.0 %   MCV 88.8 78.0 - 100.0 fL   MCH 28.3 26.0 - 34.0 pg   MCHC 31.8 30.0 - 36.0 g/dL   RDW 16.5 (H) 11.5 - 15.5 %   Platelets 202 150 - 400 K/uL  Comprehensive metabolic panel     Status: Abnormal   Collection Time: 09/07/16  2:54 AM  Result Value Ref Range   Sodium 143 135 - 145 mmol/L   Potassium 4.3 3.5 - 5.1 mmol/L   Chloride 115 (H) 101 - 111 mmol/L   CO2 23 22 - 32 mmol/L   Glucose, Bld 75 65 - 99 mg/dL   BUN 46 (H) 6 - 20 mg/dL   Creatinine, Ser 2.04 (H) 0.61 - 1.24 mg/dL   Calcium 8.2 (L) 8.9 - 10.3 mg/dL   Total Protein 4.9 (L) 6.5 - 8.1 g/dL   Albumin 2.8 (L) 3.5 - 5.0 g/dL   AST 19 15 - 41 U/L   ALT 16 (L) 17 - 63 U/L   Alkaline Phosphatase 48 38 - 126 U/L   Total Bilirubin 0.5 0.3 - 1.2 mg/dL   GFR calc non Af Amer 30 (L) >60 mL/min   GFR calc Af Amer 34 (L) >60 mL/min    Comment: (NOTE) The eGFR has been calculated using the CKD EPI equation. This calculation has not been validated in all clinical situations. eGFR's persistently <60 mL/min signify possible Chronic Kidney Disease.    Anion gap 5 5 - 15  Glucose, capillary     Status: None   Collection Time: 09/07/16  6:26 AM  Result Value Ref Range   Glucose-Capillary 93 65 - 99 mg/dL   Comment 1 Notify RN    Comment 2 Document in Chart   CBC     Status: Abnormal   Collection Time: 09/07/16 10:36 AM  Result Value Ref Range   WBC 6.6 4.0 - 10.5 K/uL   RBC 2.60 (L) 4.22 - 5.81 MIL/uL   Hemoglobin 7.2 (L) 13.0 - 17.0 g/dL   HCT 23.1 (L) 39.0 - 52.0 %   MCV 88.8 78.0 - 100.0 fL   MCH 27.7 26.0 - 34.0 pg   MCHC 31.2 30.0 - 36.0 g/dL   RDW 16.8 (H) 11.5 - 15.5 %   Platelets 197 150 - 400 K/uL    Studies/Results: No results found.  Medications: I have reviewed the patient's current medications.  Assessment: 1. Symptomatic anemia, black  stools suspicious for upper GI bleed Hemoglobin 5.6 at admission, improved to 7.2 after transfusion  Plan: 1. On IV Protonix drip, hemodynamically stable 2. Will keep nothing by mouth post midnight for EGD in a.m.   Ronnette Juniper 09/07/2016, 2:05 PM   Pager [phone] If no answer or after 5 PM call [phone]

## 2016-09-07 NOTE — H&P (Deleted)
PROGRESS NOTE    [name]  WYO:[id number] DOB: 06-Nov-1940 DOA: 09/06/2016 PCP: Paul Pretty, MD    Brief Narrative:  Paul Price is a 76 y.o. male who presents with syncope secondary to profound anemia secondary to GI bleed due to newly treated Afib w/ Xarelto. GI following.  Assessment & Plan:   Active Problems:   GIB (gastrointestinal bleeding)/Acute blood loss anemia - Gi on board and plans are for further evaluation and endoscopy - transfuse another unit of blood.    CAD - CABG '92, LAD DES 4/12, low risk Myoview 6/13 - on statin, no chest pain reported - will transfuse another unit to keep hgb level above 8.0     HTN (hypertension) -  Pt on Metoprolol    Hyperlipidemia with target LDL less than 70 - stable continue statin.    Obesity (BMI 30-39.9)    OSA on CPAP - stable continue cpap    Dyspnea on exertion - 2ary to profound anemia. Transfuse another unit of PRBC x 1     Renal insufficiency - continue to monitor serum creatinine    Primary gout - stable    Chronic diastolic (congestive) heart failure (HCC) - on lasix and metroprolol    Hypotension - resolved    A-fib ([place]) - unable to anticoagulate due to melena - rate controlled on metoprolol    DVT prophylaxis: SCD's Code Status: Full Family Communication: None at bedside. Disposition Plan: Pending improvement in condition and work up by GI specialist.   Consultants:   GI   Procedures: pending   Antimicrobials: None   Subjective: Pt has no new complaints today. Noticed some dark stools for about 1 week prior to hospitalization  Objective: Vitals:   09/07/16 0505 09/07/16 0630 09/07/16 0937 09/07/16 1007  BP: 124/65   138/61  Pulse: 72   65  Resp: 18     Temp: 97.9 F (36.6 C)  97.7 F (36.5 C)   TempSrc: Oral  Oral   SpO2: 100%     Weight:  92.7 kg (204 lb 5.9 oz)    Height:        Intake/Output Summary (Last 24 hours) at 09/07/16 1544 Last data filed at 09/07/16  0900  Gross per 24 hour  Intake          2968.83 ml  Output              100 ml  Net          2868.83 ml   Filed Weights   09/06/16 1255 09/07/16 0630  Weight: 90.3 kg (199 lb) 92.7 kg (204 lb 5.9 oz)    Examination:  General exam: Appears calm and comfortable , No acute distress Respiratory system: Clear to auscultation. Respiratory effort normal. Cardiovascular system: S1 & S2 heard, RRR. No JVD, murmurs, rubs, gallops or clicks. No pedal edema. Gastrointestinal system: Abdomen is nondistended, soft and nontender. No organomegaly or masses felt. Normal bowel sounds heard. Central nervous system: Alert and oriented. No focal neurological deficits. Extremities: Symmetric 5 x 5 power. Skin: No rashes, lesions or ulcers, on limited exam Psychiatry:  Mood & affect appropriate.   Data Reviewed: I have personally reviewed following labs and imaging studies  CBC:  Recent Labs Lab 09/06/16 1300 09/06/16 1405 09/06/16 1418 09/07/16 0254 09/07/16 1036  WBC 7.7 8.1  --  6.7 6.6  NEUTROABS  --  6.8  --   --   --   HGB 5.3* 5.6* 6.1*  7.1* 7.2*  HCT 17.3* 17.9* 18.0* 22.3* 23.1*  MCV 91.1 91.3  --  88.8 88.8  PLT 213 212  --  202 761   Basic Metabolic Panel:  Recent Labs Lab 09/06/16 1314 09/06/16 1418 09/07/16 0254  NA 138 138 143  K 4.4 4.2 4.3  CL 111 107 115*  CO2 21*  --  23  GLUCOSE 130* 123* 75  BUN 54* 53* 46*  CREATININE 2.11* 2.10* 2.04*  CALCIUM 8.2*  --  8.2*   GFR: Estimated Creatinine Clearance: 31.8 mL/min (A) (by C-G formula based on SCr of 2.04 mg/dL (H)). Liver Function Tests:  Recent Labs Lab 09/06/16 1314 09/07/16 0254  AST 21 19  ALT 17 16*  ALKPHOS 49 48  BILITOT 0.4 0.5  PROT 5.1* 4.9*  ALBUMIN 3.0* 2.8*   No results for input(s): LIPASE, AMYLASE in the last 168 hours. No results for input(s): AMMONIA in the last 168 hours. Coagulation Profile:  Recent Labs Lab 09/06/16 1405  INR 2.31   Cardiac Enzymes: No results for  input(s): CKTOTAL, CKMB, CKMBINDEX, TROPONINI in the last 168 hours. BNP (last 3 results) No results for input(s): PROBNP in the last 8760 hours. HbA1C: No results for input(s): HGBA1C in the last 72 hours. CBG:  Recent Labs Lab 09/07/16 0626  GLUCAP 93   Lipid Profile: No results for input(s): CHOL, HDL, LDLCALC, TRIG, CHOLHDL, LDLDIRECT in the last 72 hours. Thyroid Function Tests: No results for input(s): TSH, T4TOTAL, FREET4, T3FREE, THYROIDAB in the last 72 hours. Anemia Panel: No results for input(s): VITAMINB12, FOLATE, FERRITIN, TIBC, IRON, RETICCTPCT in the last 72 hours. Sepsis Labs: No results for input(s): PROCALCITON, LATICACIDVEN in the last 168 hours.  No results found for this or any previous visit (from the past 240 hour(s)).    Radiology Studies: No results found.   Scheduled Meds: . allopurinol  300 mg Oral Daily  . atorvastatin  10 mg Oral q1800  . fenofibrate  54 mg Oral Daily  . furosemide  20 mg Oral Daily  . Melatonin  6 mg Oral QHS  . metoprolol succinate  50 mg Oral BID  . senna  1 tablet Oral BID  . sodium chloride flush  3 mL Intravenous Q12H  . tamsulosin  0.4 mg Oral Daily   Continuous Infusions: . sodium chloride    . sodium chloride 100 mL/hr at 09/07/16 0635  . sodium chloride    . pantoprozole (PROTONIX) infusion 8 mg/hr (09/07/16 0636)     LOS: 1 day    Time spent: > 35 minutes  Velvet Bathe, MD [facility] Pager [phone]  If 7PM-7AM, please contact night-coverage [email or web address] Password [facility] 09/07/2016, 3:44 PM

## 2016-09-08 ENCOUNTER — Encounter (HOSPITAL_COMMUNITY): Payer: Self-pay | Admitting: *Deleted

## 2016-09-08 ENCOUNTER — Encounter (HOSPITAL_COMMUNITY)
Admission: EM | Disposition: A | Discharge status: Home / Self Care | Payer: Self-pay | Source: Home / Self Care | Attending: Family Medicine

## 2016-09-08 ENCOUNTER — Inpatient Hospital Stay (HOSPITAL_COMMUNITY): Payer: Medicare Other | Admitting: Anesthesiology

## 2016-09-08 HISTORY — PX: ESOPHAGOGASTRODUODENOSCOPY (EGD) WITH PROPOFOL: SHX5813

## 2016-09-08 LAB — CBC
HCT: 25.6 % — ABNORMAL LOW (ref 39.0–52.0)
HCT: 28 % — ABNORMAL LOW (ref 39.0–52.0)
Hemoglobin: 8.1 g/dL — ABNORMAL LOW (ref 13.0–17.0)
Hemoglobin: 8.8 g/dL — ABNORMAL LOW (ref 13.0–17.0)
MCH: 28.3 pg (ref 26.0–34.0)
MCH: 28.3 pg (ref 26.0–34.0)
MCHC: 31.6 g/dL (ref 30.0–36.0)
MCHC: 31.4 g/dL (ref 30.0–36.0)
MCV: 89.5 fL (ref 78.0–100.0)
MCV: 90 fL (ref 78.0–100.0)
Platelets: 189 10*3/uL (ref 150–400)
Platelets: 204 10*3/uL (ref 150–400)
RBC: 2.86 MIL/uL — ABNORMAL LOW (ref 4.22–5.81)
RBC: 3.11 MIL/uL — ABNORMAL LOW (ref 4.22–5.81)
RDW: 16.4 % — ABNORMAL HIGH (ref 11.5–15.5)
RDW: 16.5 % — ABNORMAL HIGH (ref 11.5–15.5)
WBC: 6.1 10*3/uL (ref 4.0–10.5)
WBC: 6.2 10*3/uL (ref 4.0–10.5)

## 2016-09-08 LAB — BPAM RBC
Blood Product Expiration Date: 201809262359
Blood Product Expiration Date: 201809262359
Blood Product Expiration Date: 201810012359
ISSUE DATE / TIME: 201808291604
ISSUE DATE / TIME: 201808292034
ISSUE DATE / TIME: 201808301708
Unit Type and Rh: 5100
Unit Type and Rh: 5100
Unit Type and Rh: 5100

## 2016-09-08 LAB — TYPE AND SCREEN
ABO/RH(D): O POS
Antibody Screen: NEGATIVE
Unit division: 0
Unit division: 0
Unit division: 0

## 2016-09-08 LAB — GLUCOSE, CAPILLARY
Glucose-Capillary: 83 mg/dL (ref 65–99)
Glucose-Capillary: 111 mg/dL — ABNORMAL HIGH (ref 65–99)

## 2016-09-08 SURGERY — ESOPHAGOGASTRODUODENOSCOPY (EGD) WITH PROPOFOL
Anesthesia: Monitor Anesthesia Care | Laterality: Left

## 2016-09-08 MED ORDER — PEG 3350-KCL-NA BICARB-NACL 420 G PO SOLR
4000.0000 mL | Freq: Once | ORAL | Status: AC
  Administered 2016-09-08: 4000 mL via ORAL
  Filled 2016-09-08: qty 4000

## 2016-09-08 MED ORDER — LACTATED RINGERS IV SOLN: INTRAVENOUS | Status: DC | PRN

## 2016-09-08 MED ORDER — PROPOFOL 500 MG/50ML IV EMUL
INTRAVENOUS | Status: DC | PRN
  Administered 2016-09-08: 100 ug/kg/min via INTRAVENOUS

## 2016-09-08 MED ORDER — DEXTROSE 5 % IV SOLN
10.0000 mg | Freq: Once | INTRAVENOUS | Status: AC
  Administered 2016-09-08: 10 mg via INTRAVENOUS
  Filled 2016-09-08: qty 1

## 2016-09-08 NOTE — Progress Notes (Addendum)
Expand All Collapse All   [] Hide copied text  PROGRESS NOTE    Paul Price           ZSW:[id number] DOB: 1940/11/18 DOA: 09/06/2016 PCP: [name], MD            Brief Narrative:  Paul Price a 76 y.o.malewho presents with syncope secondary to profound anemia secondary to GI bleed due to newly treated Afib w/ Xarelto. GI following.  Assessment & Plan:   Active Problems:   GIB (gastrointestinal bleeding)/Acute blood loss anemia - GI on board and plans are for further evaluation with endoscopy - hgb stable after transfusion    CAD - CABG '92, LAD DES 4/12, low risk Myoview 6/13 - on statin, no chest pain reported - will transfuse another unit to keep hgb level above 8.0     HTN (hypertension) -  Pt on Metoprolol    Hyperlipidemia with target LDL less than 70 - stable continue statin.    Obesity (BMI 30-39.9)    OSA on CPAP - stable continue cpap    Dyspnea on exertion - 2ary to profound anemia. Transfuse another unit of PRBC x 1     Renal insufficiency - continue to monitor serum creatinine    Primary gout - stable    Chronic diastolic (congestive) heart failure (HCC) - on lasix and metroprolol    Hypotension - resolved    A-fib ([place]) - unable to anticoagulate due to melena - rate controlled on metoprolol    DVT prophylaxis: SCD's Code Status: Full Family Communication: None at bedside. Disposition Plan: Pending work up by GI specialist.   Consultants:   GI   Procedures: pending   Antimicrobials: None   Subjective: No new problems reported today. Noticed some dark stools for about 1 week prior to hospitalization  Objective:       Vitals:   09/07/16 0505 09/07/16 0630 09/07/16 0937 09/07/16 1007  BP: 124/65   138/61  Pulse: 72   65  Resp: 18     Temp: 97.9 F (36.6 C)  97.7 F (36.5 C)   TempSrc: Oral  Oral   SpO2: 100%     Weight:  92.7 kg (204 lb 5.9 oz)    Height:          Intake/Output Summary (Last 24 hours) at 09/07/16 1544 Last data filed at 09/07/16 0900  Gross per 24 hour  Intake          2968.83 ml  Output              100 ml  Net          2868.83 ml       Filed Weights   09/06/16 1255 09/07/16 0630  Weight: 90.3 kg (199 lb) 92.7 kg (204 lb 5.9 oz)    Examination:Physical exam unchanged when compared to 09/07/2016  General exam: Appears calm and comfortable , No acute distress Respiratory system: Clear to auscultation. Respiratory effort normal. Cardiovascular system: S1 & S2 heard, RRR. No JVD, murmurs, rubs, gallops or clicks. No pedal edema. Gastrointestinal system: Abdomen is nondistended, soft and nontender. No organomegaly or masses felt. Normal bowel sounds heard. Central nervous system: Alert and oriented. No focal neurological deficits. Extremities: Symmetric 5 x 5 power. Skin: No rashes, lesions or ulcers, on limited exam Psychiatry:  Mood & affect appropriate.   Data Reviewed: I have personally reviewed following labs and imaging studies  CBC:  Last Labs    Recent Labs  Lab 09/06/16 1300 09/06/16 1405 09/06/16 1418 09/07/16 0254 09/07/16 1036  WBC 7.7 8.1  --  6.7 6.6  NEUTROABS  --  6.8  --   --   --   HGB 5.3* 5.6* 6.1* 7.1* 7.2*  HCT 17.3* 17.9* 18.0* 22.3* 23.1*  MCV 91.1 91.3  --  88.8 88.8  PLT 213 212  --  202 197     Basic Metabolic Panel:  Last Labs    Recent Labs Lab 09/06/16 1314 09/06/16 1418 09/07/16 0254  NA 138 138 143  K 4.4 4.2 4.3  CL 111 107 115*  CO2 21*  --  23  GLUCOSE 130* 123* 75  BUN 54* 53* 46*  CREATININE 2.11* 2.10* 2.04*  CALCIUM 8.2*  --  8.2*     GFR: Estimated Creatinine Clearance: 31.8 mL/min (A) (by C-G formula based on SCr of 2.04 mg/dL (H)). Liver Function Tests:  Last Labs    Recent Labs Lab 09/06/16 1314 09/07/16 0254  AST 21 19  ALT 17 16*  ALKPHOS 49 48  BILITOT 0.4 0.5  PROT 5.1* 4.9*  ALBUMIN 3.0* 2.8*     Last Labs   No  results for input(s): LIPASE, AMYLASE in the last 168 hours.   Last Labs   No results for input(s): AMMONIA in the last 168 hours.   Coagulation Profile:  Last Labs    Recent Labs Lab 09/06/16 1405  INR 2.31     Cardiac Enzymes: Last Labs   No results for input(s): CKTOTAL, CKMB, CKMBINDEX, TROPONINI in the last 168 hours.   BNP (last 3 results) Recent Labs (within last 365 days)  No results for input(s): PROBNP in the last 8760 hours.   HbA1C: Recent Labs (last 2 labs)   No results for input(s): HGBA1C in the last 72 hours.   CBG:  Last Labs    Recent Labs Lab 09/07/16 0626  GLUCAP 93     Lipid Profile: Recent Labs (last 2 labs)   No results for input(s): CHOL, HDL, LDLCALC, TRIG, CHOLHDL, LDLDIRECT in the last 72 hours.   Thyroid Function Tests: Recent Labs (last 2 labs)   No results for input(s): TSH, T4TOTAL, FREET4, T3FREE, THYROIDAB in the last 72 hours.   Anemia Panel: Recent Labs (last 2 labs)   No results for input(s): VITAMINB12, FOLATE, FERRITIN, TIBC, IRON, RETICCTPCT in the last 72 hours.   Sepsis Labs: Last Labs   No results for input(s): PROCALCITON, LATICACIDVEN in the last 168 hours.    No results found for this or any previous visit (from the past 240 hour(s)).    Radiology Studies: Imaging Results (Last 48 hours)  No results found.     Scheduled Meds: . allopurinol  300 mg Oral Daily  . atorvastatin  10 mg Oral q1800  . fenofibrate  54 mg Oral Daily  . furosemide  20 mg Oral Daily  . Melatonin  6 mg Oral QHS  . metoprolol succinate  50 mg Oral BID  . senna  1 tablet Oral BID  . sodium chloride flush  3 mL Intravenous Q12H  . tamsulosin  0.4 mg Oral Daily   Continuous Infusions: . sodium chloride    . sodium chloride 100 mL/hr at 09/07/16 0635  . sodium chloride    . pantoprozole (PROTONIX) infusion 8 mg/hr (09/07/16 0636)     LOS: 1 day    Time spent: > 35 minutes  Velvet Bathe, MD [facility] Pager [phone]  If 7PM-7AM, please contact night-coverage [email or web address] Password [facility] 09/07/2016, 3:44 PM      Routing History

## 2016-09-08 NOTE — Progress Notes (Signed)
Expand All Collapse All   [] Hide copied text  PROGRESS NOTE    Paul Price           KNL:[id number] DOB: 08-Jan-1941 DOA: 09/06/2016 PCP: [name], MD            Brief Narrative:  Paul Price a 76 y.o.malewho presents with syncope secondary to profound anemia secondary to GI bleed due to newly treated Afib w/ Xarelto. GI following.  Assessment & Plan:   Active Problems:   GIB (gastrointestinal bleeding)/Acute blood loss anemia - Gi on board and plans are for further evaluation and endoscopy - transfuse another unit of blood.    CAD - CABG '92, LAD DES 4/12, low risk Myoview 6/13 - on statin, no chest pain reported - will transfuse another unit to keep hgb level above 8.0     HTN (hypertension) -  Pt on Metoprolol    Hyperlipidemia with target LDL less than 70 - stable continue statin.    Obesity (BMI 30-39.9)    OSA on CPAP - stable continue cpap    Dyspnea on exertion - 2ary to profound anemia. Transfuse another unit of PRBC x 1     Renal insufficiency - continue to monitor serum creatinine    Primary gout - stable    Chronic diastolic (congestive) heart failure (HCC) - on lasix and metroprolol    Hypotension - resolved    A-fib ([place]) - unable to anticoagulate due to melena - rate controlled on metoprolol    DVT prophylaxis: SCD's Code Status: Full Family Communication: None at bedside. Disposition Plan: Pending improvement in condition and work up by GI specialist.   Consultants:   GI   Procedures: pending   Antimicrobials: None   Subjective: Pt has no new complaints today. Noticed some dark stools for about 1 week prior to hospitalization  Objective:       Vitals:   09/07/16 0505 09/07/16 0630 09/07/16 0937 09/07/16 1007  BP: 124/65   138/61  Pulse: 72   65  Resp: 18     Temp: 97.9 F (36.6 C)  97.7 F (36.5 C)   TempSrc: Oral  Oral   SpO2: 100%     Weight:  92.7 kg (204 lb  5.9 oz)    Height:        Intake/Output Summary (Last 24 hours) at 09/07/16 1544 Last data filed at 09/07/16 0900  Gross per 24 hour  Intake          2968.83 ml  Output              100 ml  Net          2868.83 ml       Filed Weights   09/06/16 1255 09/07/16 0630  Weight: 90.3 kg (199 lb) 92.7 kg (204 lb 5.9 oz)    Examination:  General exam: Appears calm and comfortable , No acute distress Respiratory system: Clear to auscultation. Respiratory effort normal. Cardiovascular system: S1 & S2 heard, RRR. No JVD, murmurs, rubs, gallops or clicks. No pedal edema. Gastrointestinal system: Abdomen is nondistended, soft and nontender. No organomegaly or masses felt. Normal bowel sounds heard. Central nervous system: Alert and oriented. No focal neurological deficits. Extremities: Symmetric 5 x 5 power. Skin: No rashes, lesions or ulcers, on limited exam Psychiatry:  Mood & affect appropriate.   Data Reviewed: I have personally reviewed following labs and imaging studies  CBC:  Last Labs    Recent Labs Lab  09/06/16 1300 09/06/16 1405 09/06/16 1418 09/07/16 0254 09/07/16 1036  WBC 7.7 8.1  --  6.7 6.6  NEUTROABS  --  6.8  --   --   --   HGB 5.3* 5.6* 6.1* 7.1* 7.2*  HCT 17.3* 17.9* 18.0* 22.3* 23.1*  MCV 91.1 91.3  --  88.8 88.8  PLT 213 212  --  202 197     Basic Metabolic Panel:  Last Labs    Recent Labs Lab 09/06/16 1314 09/06/16 1418 09/07/16 0254  NA 138 138 143  K 4.4 4.2 4.3  CL 111 107 115*  CO2 21*  --  23  GLUCOSE 130* 123* 75  BUN 54* 53* 46*  CREATININE 2.11* 2.10* 2.04*  CALCIUM 8.2*  --  8.2*     GFR: Estimated Creatinine Clearance: 31.8 mL/min (A) (by C-G formula based on SCr of 2.04 mg/dL (H)). Liver Function Tests:  Last Labs    Recent Labs Lab 09/06/16 1314 09/07/16 0254  AST 21 19  ALT 17 16*  ALKPHOS 49 48  BILITOT 0.4 0.5  PROT 5.1* 4.9*  ALBUMIN 3.0* 2.8*     Last Labs   No results for input(s):  LIPASE, AMYLASE in the last 168 hours.   Last Labs   No results for input(s): AMMONIA in the last 168 hours.   Coagulation Profile:  Last Labs    Recent Labs Lab 09/06/16 1405  INR 2.31     Cardiac Enzymes: Last Labs   No results for input(s): CKTOTAL, CKMB, CKMBINDEX, TROPONINI in the last 168 hours.   BNP (last 3 results) Recent Labs (within last 365 days)  No results for input(s): PROBNP in the last 8760 hours.   HbA1C: Recent Labs (last 2 labs)   No results for input(s): HGBA1C in the last 72 hours.   CBG:  Last Labs    Recent Labs Lab 09/07/16 0626  GLUCAP 93     Lipid Profile: Recent Labs (last 2 labs)   No results for input(s): CHOL, HDL, LDLCALC, TRIG, CHOLHDL, LDLDIRECT in the last 72 hours.   Thyroid Function Tests: Recent Labs (last 2 labs)   No results for input(s): TSH, T4TOTAL, FREET4, T3FREE, THYROIDAB in the last 72 hours.   Anemia Panel: Recent Labs (last 2 labs)   No results for input(s): VITAMINB12, FOLATE, FERRITIN, TIBC, IRON, RETICCTPCT in the last 72 hours.   Sepsis Labs: Last Labs   No results for input(s): PROCALCITON, LATICACIDVEN in the last 168 hours.    No results found for this or any previous visit (from the past 240 hour(s)).    Radiology Studies: Imaging Results (Last 48 hours)  No results found.     Scheduled Meds: . allopurinol  300 mg Oral Daily  . atorvastatin  10 mg Oral q1800  . fenofibrate  54 mg Oral Daily  . furosemide  20 mg Oral Daily  . Melatonin  6 mg Oral QHS  . metoprolol succinate  50 mg Oral BID  . senna  1 tablet Oral BID  . sodium chloride flush  3 mL Intravenous Q12H  . tamsulosin  0.4 mg Oral Daily   Continuous Infusions: . sodium chloride    . sodium chloride 100 mL/hr at 09/07/16 0635  . sodium chloride    . pantoprozole (PROTONIX) infusion 8 mg/hr (09/07/16 0636)     LOS: 1 day    Time spent: > 35 minutes  Velvet Bathe, MD [facility] Pager [phone]  If  7PM-7AM, please contact night-coverage [email or web address] Password [facility] 09/07/2016, 3:44 PM      Routing History

## 2016-09-08 NOTE — Interval H&P Note (Signed)
History and Physical Interval Note: 76 year old Caucasian male with symptomatic anemia and black stools suspicious for upper GI bleed.Hb 5.6 on admission. Patient for an EGD. 09/08/2016 12:41 PM  Emmons E [place]  has presented today for EGD, with the diagnosis of anemia  The various methods of treatment have been discussed with the patient and family. After consideration of risks, benefits and other options for treatment, the patient has consented to  Procedure(s): ESOPHAGOGASTRODUODENOSCOPY (EGD) WITH PROPOFOL (Left) as a surgical intervention .  The patient's history has been reviewed, patient examined, no change in status, stable for surgery.  I have reviewed the patient's chart and labs.  Questions were answered to the patient's satisfaction.     Ronnette Juniper

## 2016-09-08 NOTE — Op Note (Addendum)
[facility] [name] Procedure Date : 09/08/2016 MRN: [id number] Attending MD: Ronnette Juniper , MD Date of Birth: August 14, 1940 CSN: [id number] Age: 76 Admit Type: Inpatient Procedure:                Upper GI endoscopy Indications:              Iron deficiency anemia due to suspected upper                            gastrointestinal bleeding, Melena Providers:                Ronnette Juniper, MD, Cleda Daub, RN, Corliss Parish,                            Technician Referring MD:              Medicines:                Monitored Anesthesia Care Complications:            No immediate complications. Estimated Blood Loss:     Estimated blood loss: none. Procedure:                Pre-Anesthesia Assessment:                           - Prior to the procedure, a History and Physical                            was performed, and patient medications and                            allergies were reviewed. The patient's tolerance of                            previous anesthesia was also reviewed. The risks                            and benefits of the procedure and the sedation                            options and risks were discussed with the patient.                            All questions were answered, and informed consent                            was obtained. Prior Anticoagulants: The patient has                            taken Xarelto (rivaroxaban), last dose was 3 days                            prior to procedure. ASA Grade Assessment: III - A  patient with severe systemic disease. After                            reviewing the risks and benefits, the patient was                            deemed in satisfactory condition to undergo the                            procedure.                           After obtaining informed consent, the endoscope was                            passed under direct vision. Throughout the                 procedure, the patient's blood pressure, pulse, and                            oxygen saturations were monitored continuously. The                            EG-[id number] ([id number]) scope was introduced through the                            mouth, and advanced to the second part of duodenum.                            The upper GI endoscopy was accomplished without                            difficulty. The patient tolerated the procedure                            well. Scope In: Scope Out: Findings:      The examined esophagus was normal.      The Z-line was regular and was found 40 cm from the incisors.      Localized mildly erythematous mucosa without bleeding was found in the       gastric antrum. Biopsy is contraindicated because INR was 2.31(09/06/16).      The cardia and gastric fundus were normal on retroflexion.      The examined duodenum was normal.      During withdrawl of the scope, minimal oozing was noted from the       duodenal bulb.This was most likely related to scope induced trauma. Impression:               - Normal esophagus.                           - Z-line regular, 40 cm from the incisors.                           - Erythematous mucosa in the antrum. Biopsy is  contraindicated.                           - Normal examined duodenum.                           - No specimens collected.                           - Scope induced trauma in duodenal bulb causing                            minimal self limited oozing. Moderate Sedation:      Patient did not receive moderate sedation for this procedure, but       instead received monitored anesthesia care. Recommendation:           - Clear liquid diet.                           - Will give Vitamin K 10 mg 1 dose, PT/INR in am.                           - NPO post midnight.                           - Perform a colonoscopy tomorrow.                           - Continue present  medications.                           - Return patient to hospital ward for ongoing care. Procedure Code(s):        --- Professional ---                           [phone], Esophagogastroduodenoscopy, flexible,                            transoral; diagnostic, including collection of                            specimen(s) by brushing or washing, when performed                            (separate procedure) Diagnosis Code(s):        --- Professional ---                           K31.89, Other diseases of stomach and duodenum                           D50.9, Iron deficiency anemia, unspecified                           K92.1, Melena (includes Hematochezia) CPT copyright 2016 [employer]. All rights reserved. The codes documented in this report are preliminary and upon coder review may  be  revised to meet current compliance requirements. Ronnette Juniper, MD 09/08/2016 1:23:01 PM This report has been signed electronically. Number of Addenda: 0

## 2016-09-08 NOTE — Op Note (Signed)
EGD showed a normal esophagus, Z line was regular at 40 cm. Mild erythema was noted in the antrum. Cardia and fundus appeared unremarkable on retroflexion. Duodenal bulb and rest of the duodenum appeared unremarkable. Scope-induced trauma caused minimal self-limited oozing in the duodenal bulb.  No obvious source of GI bleeding noted.  Recommend colonoscopy in a.m. Continue to hold Xarelto.  Ronnette Juniper, M.D. [phone]

## 2016-09-08 NOTE — Care Management Note (Signed)
Case Management Note Marvetta Gibbons RN, BSN Unit 4E-Case Manager [phone]  Patient Details  Name: Paul Price MRN: [id number] Date of Birth: [birth year]  Subjective/Objective:   Pt admitted with GIB                 Action/Plan: PTA pt lived at home with wife- independent-anticipate return home- CM to follow for d/c needs  Expected Discharge Date:                  Expected Discharge Plan:  Home/Self Care  In-House Referral:     Discharge planning Services  CM Consult  Post Acute Care Choice:    Choice offered to:     DME Arranged:    DME Agency:     HH Arranged:    HH Agency:     Status of Service:  In process, will continue to follow  If discussed at Long Length of Stay Meetings, dates discussed:    Discharge Disposition:   Additional Comments:  Dawayne Patricia, RN 09/08/2016, 2:13 PM

## 2016-09-08 NOTE — Progress Notes (Signed)
Pt refusing CPAP for tonight. CPAP setup at bedside. I told him to call if he changes his mind

## 2016-09-08 NOTE — Anesthesia Postprocedure Evaluation (Signed)
Anesthesia Post Note  Patient: Paul Price  Procedure(s) Performed: Procedure(s) (LRB): ESOPHAGOGASTRODUODENOSCOPY (EGD) WITH PROPOFOL (Left)     Patient location during evaluation: PACU Anesthesia Type: MAC Level of consciousness: awake and alert Pain management: pain level controlled Vital Signs Assessment: post-procedure vital signs reviewed and stable Respiratory status: spontaneous breathing, nonlabored ventilation, respiratory function stable and patient connected to nasal cannula oxygen Cardiovascular status: stable and blood pressure returned to baseline Anesthetic complications: no    Last Vitals:  Vitals:   09/08/16 1330 09/08/16 1340  BP: (!) 147/60 121/73  Pulse: 62 75  Resp: (!) 21 (!) 22  Temp:    SpO2: 100% 99%    Last Pain:  Vitals:   09/08/16 1324  TempSrc: Oral  PainSc:                  Tiajuana Amass

## 2016-09-08 NOTE — Brief Op Note (Signed)
09/06/2016 - 09/08/2016  1:23 PM  PATIENT:  Paul Price  76 y.o. male  PRE-OPERATIVE DIAGNOSIS:  anemia  POST-OPERATIVE DIAGNOSIS:  duodenal erosion  PROCEDURE:  Procedure(s): ESOPHAGOGASTRODUODENOSCOPY (EGD) WITH PROPOFOL (Left)  SURGEON:  Surgeon(s) and Role:    Ronnette Juniper, MD - Primary  PHYSICIAN ASSISTANT:   ASSISTANTS: none   ANESTHESIA:   MAC  EBL:  Total I/O In: 0  Out: 300 [Urine:300]  BLOOD ADMINISTERED:none  DRAINS: none   LOCAL MEDICATIONS USED:  NONE  SPECIMEN:  No Specimen  DISPOSITION OF SPECIMEN:  N/A  COUNTS:  YES  TOURNIQUET:  * No tourniquets in log *  DICTATION: .Dragon Dictation  PLAN OF CARE: Admit to inpatient   PATIENT DISPOSITION:  PACU - hemodynamically stable.   Delay start of Pharmacological VTE agent (>24hrs) due to surgical blood loss or risk of bleeding: yes

## 2016-09-08 NOTE — Anesthesia Preprocedure Evaluation (Addendum)
Anesthesia Evaluation  Patient identified by MRN, date of birth, ID band Patient awake    Reviewed: Allergy & Precautions, NPO status , Patient's Chart, lab work & pertinent test results  Airway Mallampati: III       Dental  (+) Teeth Intact   Pulmonary sleep apnea , former smoker,    Pulmonary exam normal        Cardiovascular hypertension, + CAD, + Peripheral Vascular Disease and +CHF  Normal cardiovascular exam+ dysrhythmias      Neuro/Psych  Neuromuscular disease    GI/Hepatic Neg liver ROS, GERD  ,  Endo/Other  negative endocrine ROS  Renal/GU Renal disease     Musculoskeletal  (+) Arthritis ,   Abdominal   Peds  Hematology  (+) anemia ,   Anesthesia Other Findings   Reproductive/Obstetrics                           Anesthesia Physical Anesthesia Plan  ASA: III  Anesthesia Plan: MAC   Post-op Pain Management:    Induction: Intravenous  PONV Risk Score and Plan: 2 and Ondansetron, Propofol infusion and Treatment may vary due to age or medical condition  Airway Management Planned: Nasal Cannula and Natural Airway  Additional Equipment:   Intra-op Plan:   Post-operative Plan:   Informed Consent: I have reviewed the patients History and Physical, chart, labs and discussed the procedure including the risks, benefits and alternatives for the proposed anesthesia with the patient or authorized representative who has indicated his/her understanding and acceptance.     Plan Discussed with: Anesthesiologist and CRNA  Anesthesia Plan Comments:        Anesthesia Quick Evaluation

## 2016-09-08 NOTE — H&P (View-Only) (Signed)
Subjective: The patient was seen and examined at bedside. He reports that stools were not dark today. Denies abdominal pain, nausea or vomiting.  Objective: Vital signs in last 24 hours: Temp:  [97.7 F (36.5 C)-98.7 F (37.1 C)] 97.7 F (36.5 C) (08/30 0937) Pulse Rate:  [50-81] 65 (08/30 1007) Resp:  [13-36] 18 (08/30 0505) BP: (112-138)/(50-67) 138/61 (08/30 1007) SpO2:  [97 %-100 %] 100 % (08/30 0505) Weight:  [92.7 kg (204 lb 5.9 oz)] 92.7 kg (204 lb 5.9 oz) (08/30 0630) Weight change:  Last BM Date: 09/06/16  PE: Overweight, not in acute distress GENERAL: Pallor, no icterus ABDOMEN: Soft, nondistended, nontender, normoactive bowel sounds EXTREMITIES: No edema, no deformity  Lab Results: Results for orders placed or performed during the hospital encounter of 09/06/16 (from the past 48 hour(s))  CBC     Status: Abnormal   Collection Time: 09/06/16  1:00 PM  Result Value Ref Range   WBC 7.7 4.0 - 10.5 K/uL   RBC 1.90 (L) 4.22 - 5.81 MIL/uL   Hemoglobin 5.3 (LL) 13.0 - 17.0 g/dL    Comment: REPEATED TO VERIFY CRITICAL RESULT CALLED TO, READ BACK BY AND VERIFIED WITH: K COBB,RN [id number] WILDERK    HCT 17.3 (L) 39.0 - 52.0 %   MCV 91.1 78.0 - 100.0 fL   MCH 27.9 26.0 - 34.0 pg   MCHC 30.6 30.0 - 36.0 g/dL   RDW 16.3 (H) 11.5 - 15.5 %   Platelets 213 150 - 400 K/uL  Comprehensive metabolic panel     Status: Abnormal   Collection Time: 09/06/16  1:14 PM  Result Value Ref Range   Sodium 138 135 - 145 mmol/L   Potassium 4.4 3.5 - 5.1 mmol/L   Chloride 111 101 - 111 mmol/L   CO2 21 (L) 22 - 32 mmol/L   Glucose, Bld 130 (H) 65 - 99 mg/dL   BUN 54 (H) 6 - 20 mg/dL   Creatinine, Ser 2.11 (H) 0.61 - 1.24 mg/dL   Calcium 8.2 (L) 8.9 - 10.3 mg/dL   Total Protein 5.1 (L) 6.5 - 8.1 g/dL   Albumin 3.0 (L) 3.5 - 5.0 g/dL   AST 21 15 - 41 U/L   ALT 17 17 - 63 U/L   Alkaline Phosphatase 49 38 - 126 U/L   Total Bilirubin 0.4 0.3 - 1.2 mg/dL   GFR calc non Af Amer 28 (L)  >60 mL/min   GFR calc Af Amer 33 (L) >60 mL/min    Comment: (NOTE) The eGFR has been calculated using the CKD EPI equation. This calculation has not been validated in all clinical situations. eGFR's persistently <60 mL/min signify possible Chronic Kidney Disease.    Anion gap 6 5 - 15  I-Stat Troponin, ED (not at [facility])     Status: None   Collection Time: 09/06/16  1:42 PM  Result Value Ref Range   Troponin i, poc 0.01 0.00 - 0.08 ng/mL   Comment 3            Comment: Due to the release kinetics of cTnI, a negative result within the first hours of the onset of symptoms does not rule out myocardial infarction with certainty. If myocardial infarction is still suspected, repeat the test at appropriate intervals.   Type and screen [place]     Status: None   Collection Time: 09/06/16  2:00 PM  Result Value Ref Range   ABO/RH(D) O POS    Antibody Screen  NEG    Sample Expiration 09/09/2016    Unit Number [id number]    Blood Component Type RBC LR PHER1    Unit division 00    Status of Unit ISSUED,FINAL    Transfusion Status OK TO TRANSFUSE    Crossmatch Result Compatible    Unit Number [id number]    Blood Component Type RBC LR PHER2    Unit division 00    Status of Unit ISSUED,FINAL    Transfusion Status OK TO TRANSFUSE    Crossmatch Result Compatible   ABO/Rh     Status: None   Collection Time: 09/06/16  2:00 PM  Result Value Ref Range   ABO/RH(D) O POS   CBC with Differential     Status: Abnormal   Collection Time: 09/06/16  2:05 PM  Result Value Ref Range   WBC 8.1 4.0 - 10.5 K/uL   RBC 1.96 (L) 4.22 - 5.81 MIL/uL   Hemoglobin 5.6 (LL) 13.0 - 17.0 g/dL    Comment: REPEATED TO VERIFY CRITICAL VALUE NOTED.  VALUE IS CONSISTENT WITH PREVIOUSLY REPORTED AND CALLED VALUE.    HCT 17.9 (L) 39.0 - 52.0 %   MCV 91.3 78.0 - 100.0 fL   MCH 28.6 26.0 - 34.0 pg   MCHC 31.3 30.0 - 36.0 g/dL   RDW 16.6 (H) 11.5 - 15.5 %   Platelets 212 150 - 400 K/uL    Neutrophils Relative % 84 %   Neutro Abs 6.8 1.7 - 7.7 K/uL   Lymphocytes Relative 11 %   Lymphs Abs 0.9 0.7 - 4.0 K/uL   Monocytes Relative 4 %   Monocytes Absolute 0.3 0.1 - 1.0 K/uL   Eosinophils Relative 1 %   Eosinophils Absolute 0.1 0.0 - 0.7 K/uL   Basophils Relative 0 %   Basophils Absolute 0.0 0.0 - 0.1 K/uL  Protime-INR     Status: Abnormal   Collection Time: 09/06/16  2:05 PM  Result Value Ref Range   Prothrombin Time 25.2 (H) 11.4 - 15.2 seconds   INR 2.31   APTT     Status: None   Collection Time: 09/06/16  2:05 PM  Result Value Ref Range   aPTT 24 24 - 36 seconds  POC occult blood, ED     Status: Abnormal   Collection Time: 09/06/16  2:06 PM  Result Value Ref Range   Fecal Occult Bld POSITIVE (A) NEGATIVE  Prepare RBC     Status: None   Collection Time: 09/06/16  2:09 PM  Result Value Ref Range   Order Confirmation ORDER PROCESSED BY BLOOD BANK   I-Stat Chem 8, ED     Status: Abnormal   Collection Time: 09/06/16  2:18 PM  Result Value Ref Range   Sodium 138 135 - 145 mmol/L   Potassium 4.2 3.5 - 5.1 mmol/L   Chloride 107 101 - 111 mmol/L   BUN 53 (H) 6 - 20 mg/dL   Creatinine, Ser 2.10 (H) 0.61 - 1.24 mg/dL   Glucose, Bld 123 (H) 65 - 99 mg/dL   Calcium, Ion 1.02 (L) 1.15 - 1.40 mmol/L   TCO2 18 (L) 22 - 32 mmol/L   Hemoglobin 6.1 (LL) 13.0 - 17.0 g/dL   HCT 18.0 (L) 39.0 - 52.0 %   Comment NOTIFIED PHYSICIAN   Urinalysis, Routine w reflex microscopic     Status: Abnormal   Collection Time: 09/06/16  4:32 PM  Result Value Ref Range   Color, Urine STRAW (A) YELLOW   APPearance  CLEAR CLEAR   Specific Gravity, Urine 1.009 1.005 - 1.030   pH 5.0 5.0 - 8.0   Glucose, UA NEGATIVE NEGATIVE mg/dL   Hgb urine dipstick NEGATIVE NEGATIVE   Bilirubin Urine NEGATIVE NEGATIVE   Ketones, ur NEGATIVE NEGATIVE mg/dL   Protein, ur NEGATIVE NEGATIVE mg/dL   Nitrite NEGATIVE NEGATIVE   Leukocytes, UA NEGATIVE NEGATIVE  CBC     Status: Abnormal   Collection Time:  09/07/16  2:54 AM  Result Value Ref Range   WBC 6.7 4.0 - 10.5 K/uL   RBC 2.51 (L) 4.22 - 5.81 MIL/uL   Hemoglobin 7.1 (L) 13.0 - 17.0 g/dL   HCT 22.3 (L) 39.0 - 52.0 %   MCV 88.8 78.0 - 100.0 fL   MCH 28.3 26.0 - 34.0 pg   MCHC 31.8 30.0 - 36.0 g/dL   RDW 16.5 (H) 11.5 - 15.5 %   Platelets 202 150 - 400 K/uL  Comprehensive metabolic panel     Status: Abnormal   Collection Time: 09/07/16  2:54 AM  Result Value Ref Range   Sodium 143 135 - 145 mmol/L   Potassium 4.3 3.5 - 5.1 mmol/L   Chloride 115 (H) 101 - 111 mmol/L   CO2 23 22 - 32 mmol/L   Glucose, Bld 75 65 - 99 mg/dL   BUN 46 (H) 6 - 20 mg/dL   Creatinine, Ser 2.04 (H) 0.61 - 1.24 mg/dL   Calcium 8.2 (L) 8.9 - 10.3 mg/dL   Total Protein 4.9 (L) 6.5 - 8.1 g/dL   Albumin 2.8 (L) 3.5 - 5.0 g/dL   AST 19 15 - 41 U/L   ALT 16 (L) 17 - 63 U/L   Alkaline Phosphatase 48 38 - 126 U/L   Total Bilirubin 0.5 0.3 - 1.2 mg/dL   GFR calc non Af Amer 30 (L) >60 mL/min   GFR calc Af Amer 34 (L) >60 mL/min    Comment: (NOTE) The eGFR has been calculated using the CKD EPI equation. This calculation has not been validated in all clinical situations. eGFR's persistently <60 mL/min signify possible Chronic Kidney Disease.    Anion gap 5 5 - 15  Glucose, capillary     Status: None   Collection Time: 09/07/16  6:26 AM  Result Value Ref Range   Glucose-Capillary 93 65 - 99 mg/dL   Comment 1 Notify RN    Comment 2 Document in Chart   CBC     Status: Abnormal   Collection Time: 09/07/16 10:36 AM  Result Value Ref Range   WBC 6.6 4.0 - 10.5 K/uL   RBC 2.60 (L) 4.22 - 5.81 MIL/uL   Hemoglobin 7.2 (L) 13.0 - 17.0 g/dL   HCT 23.1 (L) 39.0 - 52.0 %   MCV 88.8 78.0 - 100.0 fL   MCH 27.7 26.0 - 34.0 pg   MCHC 31.2 30.0 - 36.0 g/dL   RDW 16.8 (H) 11.5 - 15.5 %   Platelets 197 150 - 400 K/uL    Studies/Results: No results found.  Medications: I have reviewed the patient's current medications.  Assessment: 1. Symptomatic anemia, black  stools suspicious for upper GI bleed Hemoglobin 5.6 at admission, improved to 7.2 after transfusion  Plan: 1. On IV Protonix drip, hemodynamically stable 2. Will keep nothing by mouth post midnight for EGD in a.m.   Paul Price 09/07/2016, 2:05 PM   Pager [phone] If no answer or after 5 PM call [phone]

## 2016-09-08 NOTE — Anesthesia Procedure Notes (Signed)
Procedure Name: MAC Date/Time: 09/08/2016 1:00 PM Performed by: Kyung Rudd Pre-anesthesia Checklist: Patient identified, Emergency Drugs available, Suction available and Patient being monitored Patient Re-evaluated:Patient Re-evaluated prior to induction Oxygen Delivery Method: Nasal cannula Induction Type: IV induction Placement Confirmation: positive ETCO2 Dental Injury: Teeth and Oropharynx as per pre-operative assessment

## 2016-09-08 NOTE — Transfer of Care (Signed)
Immediate Anesthesia Transfer of Care Note  Patient: Paul Price  Procedure(s) Performed: Procedure(s): ESOPHAGOGASTRODUODENOSCOPY (EGD) WITH PROPOFOL (Left)  Patient Location: Endoscopy Unit  Anesthesia Type:MAC  Level of Consciousness: awake, alert  and oriented  Airway & Oxygen Therapy: Patient Spontanous Breathing and Patient connected to nasal cannula oxygen  Post-op Assessment: Report given to RN, Post -op Vital signs reviewed and stable and Patient moving all extremities X 4  Post vital signs: Reviewed and stable  Last Vitals:  Vitals:   09/08/16 1227 09/08/16 1324  BP: (!) 156/58 (!) 137/56  Pulse: 64 67  Resp: 16 20  Temp: 36.6 C   SpO2: 100% 100%    Last Pain:  Vitals:   09/08/16 1227  TempSrc: Oral  PainSc:          Complications: No apparent anesthesia complications

## 2016-09-09 ENCOUNTER — Inpatient Hospital Stay (HOSPITAL_COMMUNITY): Payer: Medicare Other | Admitting: Anesthesiology

## 2016-09-09 ENCOUNTER — Encounter (HOSPITAL_COMMUNITY): Payer: Self-pay | Admitting: Certified Registered Nurse Anesthetist

## 2016-09-09 ENCOUNTER — Encounter (HOSPITAL_COMMUNITY)
Admission: EM | Disposition: A | Discharge status: Home / Self Care | Payer: Self-pay | Source: Home / Self Care | Attending: Family Medicine

## 2016-09-09 HISTORY — PX: COLONOSCOPY WITH PROPOFOL: SHX5780

## 2016-09-09 LAB — GLUCOSE, CAPILLARY: Glucose-Capillary: 79 mg/dL (ref 65–99)

## 2016-09-09 LAB — PROTIME-INR
INR: 1.18
Prothrombin Time: 15 seconds (ref 11.4–15.2)

## 2016-09-09 SURGERY — COLONOSCOPY WITH PROPOFOL
Anesthesia: Monitor Anesthesia Care | Laterality: Left

## 2016-09-09 MED ORDER — EPHEDRINE SULFATE 50 MG/ML IJ SOLN
INTRAMUSCULAR | Status: DC | PRN
  Administered 2016-09-09 (×2): 10 mg via INTRAVENOUS

## 2016-09-09 MED ORDER — RIVAROXABAN 15 MG PO TABS: 15.0000 mg | ORAL_TABLET | Freq: Every day | ORAL | 1 refills | Status: DC

## 2016-09-09 MED ORDER — PANTOPRAZOLE SODIUM 40 MG PO TBEC: 40.0000 mg | DELAYED_RELEASE_TABLET | Freq: Every day | ORAL | 0 refills | Status: DC

## 2016-09-09 MED ORDER — PROPOFOL 500 MG/50ML IV EMUL
INTRAVENOUS | Status: DC | PRN
  Administered 2016-09-09: 100 ug/kg/min via INTRAVENOUS

## 2016-09-09 MED ORDER — PROPOFOL 10 MG/ML IV BOLUS
INTRAVENOUS | Status: DC | PRN
  Administered 2016-09-09: 20 mg via INTRAVENOUS
  Administered 2016-09-09: 10 mg via INTRAVENOUS

## 2016-09-09 SURGICAL SUPPLY — 21 items

## 2016-09-09 NOTE — Discharge Summary (Signed)
Physician Discharge Summary  Paul Price:[id number] DOB: 20-Dec-1940 DOA: 09/06/2016  PCP: [name], MD  Admit date: 09/06/2016 Discharge date: 09/09/2016  Time spent: > 35 minutes  Recommendations for Outpatient Follow-up:  1. Monitor hgb levels 2. Pt will need PPI indefinitely while on anticoagulation 3. F/u with GI in 2 weeks post discharge.   Discharge Diagnoses:  Active Problems:   GIB (gastrointestinal bleeding)   Acute blood loss anemia   CAD - CABG '92, LAD DES 4/12, low risk Myoview 6/13   HTN (hypertension)   Hyperlipidemia with target LDL less than 70   Obesity (BMI 30-39.9)   OSA on CPAP   RBBB   Dyspnea on exertion   PAF (paroxysmal atrial fibrillation) (HCC)   Renal insufficiency   Syncope   Primary gout   Chronic diastolic (congestive) heart failure (HCC)   Hypotension   A-fib (HCC)   Severe anemia   Discharge Condition: stable  Diet recommendation: heart healthy diet  Filed Weights   09/07/16 0630 09/08/16 0439 09/09/16 0434  Weight: 92.7 kg (204 lb 5.9 oz) 92.7 kg (204 lb 4.8 oz) 92.1 kg (203 lb 1.6 oz)    History of present illness:  Paul y.o.Price presents with syncope secondary to profound anemia secondary to GI bleed due to newly treated Afib w/ Xarelto. GI following.  Hospital Course:  Active Problems: GIB (gastrointestinal bleeding)/Acute blood loss anemia - GI after evaluation with endocopy and colonoscopy recommended continuing anticoagulation tomorrow and discharging on protonix daily - hgb stable after transfusion  CAD - CABG '92, LAD DES 4/12, low risk Myoview 6/13 - on statin, no chest pain reported  HTN (hypertension) - Pt on Metoprolol  Hyperlipidemia with target LDL less than 70 - stable continue statin.  Obesity (BMI 30-39.9)  OSA on CPAP - stable continue cpap at home  Dyspnea on exertion - 2ary to profound anemia. Resolved after transfusion  Renal insufficiency - continue to  monitor serum creatinine  Primary gout - stable  Chronic diastolic (congestive) heart failure (HCC) - on lasix and metroprolol  Hypotension - resolved  A-fib (HCC) -  Anticoagulation to continue tomorrow - rate controlled on metoprolol    Procedures:  Endoscopy and colonoscopy  Consultations:  GI: Dr. Therisa Doyne  Discharge Exam: Vitals:   09/09/16 1340 09/09/16 1341  BP:  (!) 158/67  Pulse:  63  Resp:  16  Temp: (!) 97.3 F (36.3 C)   SpO2:  100%    General: Pt in nad, alert and awawke Cardiovascular: rrr, no rubs Respiratory: no increased wob, no wheezes Abd: no guarding, soft,  Discharge Instructions   Discharge Instructions    Call MD for:  persistant nausea and vomiting    Complete by:  As directed    Call MD for:  severe uncontrolled pain    Complete by:  As directed    Diet - low sodium heart healthy    Complete by:  As directed    Increase activity slowly    Complete by:  As directed      Current Discharge Medication List    START taking these medications   Details  pantoprazole (PROTONIX) 40 MG tablet Take 1 tablet (40 mg total) by mouth daily. Qty: 30 tablet, Refills: 0      CONTINUE these medications which have CHANGED   Details  Rivaroxaban (XARELTO) 15 MG TABS tablet Take 1 tablet (15 mg total) by mouth daily with supper. Qty: 30 tablet, Refills: 1  CONTINUE these medications which have NOT CHANGED   Details  allopurinol (ZYLOPRIM) 300 MG tablet Take 300 mg by mouth daily.    fenofibrate micronized (LOFIBRA) 134 MG capsule Take 134 mg by mouth daily before breakfast.    furosemide (LASIX) 40 MG tablet Take 20 mg by mouth daily.    Melatonin 5 MG TABS Take 5 mg by mouth at bedtime.    metoprolol succinate (TOPROL-XL) 50 MG 24 hr tablet Take 50 mg by mouth 2 (two) times daily. Take with or immediately following a meal.    Tamsulosin HCl (FLOMAX) 0.4 MG CAPS Take 1 capsule (0.4 mg total) by mouth daily. Qty: 30  capsule, Refills: 0      STOP taking these medications     aspirin 81 MG tablet      atorvastatin (LIPITOR) 10 MG tablet      esomeprazole (NEXIUM) 20 MG capsule      UNABLE TO FIND      clopidogrel (PLAVIX) 75 MG tablet        Allergies  Allergen Reactions  . Nitrostat [Nitroglycerin] Other (See Comments)    Causes blood pressure to "bottom out"      The results of significant diagnostics from this hospitalization (including imaging, microbiology, ancillary and laboratory) are listed below for reference.    Significant Diagnostic Studies: No results found.  Microbiology: No results found for this or any previous visit (from the past 240 hour(s)).   Labs: Basic Metabolic Panel:  Recent Labs Lab 09/06/16 1314 09/06/16 1418 09/07/16 0254  NA 138 138 143  K 4.4 4.2 4.3  CL 111 107 115*  CO2 21*  --  23  GLUCOSE 130* 123* 75  BUN 54* 53* 46*  CREATININE 2.11* 2.10* 2.04*  CALCIUM 8.2*  --  8.2*   Liver Function Tests:  Recent Labs Lab 09/06/16 1314 09/07/16 0254  AST 21 19  ALT 17 16*  ALKPHOS 49 48  BILITOT 0.4 0.5  PROT 5.1* 4.9*  ALBUMIN 3.0* 2.8*   No results for input(s): LIPASE, AMYLASE in the last 168 hours. No results for input(s): AMMONIA in the last 168 hours. CBC:  Recent Labs Lab 09/06/16 1405  09/07/16 1036 09/07/16 1426 09/07/16 2212 09/08/16 0424 09/08/16 0956  WBC 8.1  < > 6.6 6.9 6.5 6.1 6.2  NEUTROABS 6.8  --   --   --   --   --   --   HGB 5.6*  < > 7.2* 7.5* 8.0* 8.1* 8.8*  HCT 17.9*  < > 23.1* 23.8* 24.7* 25.6* 28.0*  MCV 91.3  < > 88.8 88.8 88.8 89.5 90.0  PLT 212  < > 197 227 201 189 204  < > = values in this interval not displayed. Cardiac Enzymes: No results for input(s): CKTOTAL, CKMB, CKMBINDEX, TROPONINI in the last 168 hours. BNP: BNP (last 3 results)  Recent Labs  08/08/16 2219  BNP 335.1*    ProBNP (last 3 results) No results for input(s): PROBNP in the last 8760 hours.  CBG:  Recent Labs Lab  09/07/16 0626 09/07/16 1619 09/08/16 0646 09/08/16 2053 09/09/16 0621  GLUCAP 93 154* 83 111* 79    Signed:  Velvet Bathe MD.  [facility] 09/09/2016, 1:48 PM

## 2016-09-09 NOTE — Anesthesia Procedure Notes (Signed)
Procedure Name: MAC Date/Time: 09/09/2016 12:41 PM Performed by: Salli Quarry Virlee Stroschein Pre-anesthesia Checklist: Patient identified, Emergency Drugs available, Suction available and Patient being monitored Patient Re-evaluated:Patient Re-evaluated prior to induction Oxygen Delivery Method: Simple face mask

## 2016-09-09 NOTE — Interval H&P Note (Signed)
History and Physical Interval Note:  09/09/2016 12:43 PM  Tiler E Kouns  has presented today for surgery, with the diagnosis of anemia, black stools, unremarkable EGD  The various methods of treatment have been discussed with the patient and family. After consideration of risks, benefits and other options for treatment, the patient has consented to  Procedure(s): COLONOSCOPY WITH PROPOFOL (Left) as a surgical intervention .  The patient's history has been reviewed, patient examined, no change in status, stable for surgery.  I have reviewed the patient's chart and labs.  Questions were answered to the patient's satisfaction.     [name]

## 2016-09-09 NOTE — Op Note (Signed)
[facility] [name] Procedure Date : 09/09/2016 MRN: [id number] Attending MD: Arta Silence , MD Date of Birth: December 17, 1940 CSN: [id number] Age: 76 Admit Type: Inpatient Procedure:                Colonoscopy Indications:              Melena, Unexplained iron deficiency anemia Providers:                Arta Silence, MD, Cleda Daub, RN, Cherylynn Ridges, Technician, Lavona Mound, CRNA Referring MD:              Medicines:                Propofol per Anesthesia Complications:            No immediate complications. Estimated Blood Loss:     Estimated blood loss: none. Procedure:                Pre-Anesthesia Assessment:                           - Prior to the procedure, a History and Physical                            was performed, and patient medications and                            allergies were reviewed. The patient's tolerance of                            previous anesthesia was also reviewed. The risks                            and benefits of the procedure and the sedation                            options and risks were discussed with the patient.                            All questions were answered, and informed consent                            was obtained. Prior Anticoagulants: The patient has                            taken Xarelto (rivaroxaban), last dose was 4 days                            prior to procedure. ASA Grade Assessment: III - A                            patient with severe systemic disease. After  reviewing the risks and benefits, the patient was                            deemed in satisfactory condition to undergo the                            procedure.                           After obtaining informed consent, the colonoscope                            was passed under direct vision. Throughout the                            procedure, the patient's blood  pressure, pulse, and                            oxygen saturations were monitored continuously. The                            EC-[id number] ([id number]) scope was introduced through                            the anus and advanced to the the cecum, identified                            by appendiceal orifice and ileocecal valve. The                            ileocecal valve, appendiceal orifice, and rectum                            were photographed. The entire colon was examined.                            The colonoscopy was performed without difficulty.                            The patient tolerated the procedure well. The                            quality of the bowel preparation was adequate. Scope In: 17:40:81 PM Scope Out: 1:03:07 PM Scope Withdrawal Time: 0 hours 10 minutes 12 seconds  Total Procedure Duration: 0 hours 13 minutes 56 seconds  Findings:      The perianal exam findings include non-thrombosed external hemorrhoids.      Internal hemorrhoids were found. The hemorrhoids were mild.      A few small-mouthed diverticula were found in the sigmoid colon.      Diminutive rectal vault; unable to do retroflexion; upon slow withdrawal       of the colonoscope through the anal canal, small internal hemorrhoids       were seen. Colon otherwise normal; no other polyps, masses, vascular       ectasias, or  inflammatory changes were seen. Impression:               - Non-thrombosed external hemorrhoids found on                            perianal exam.                           - Internal hemorrhoids.                           - Diverticulosis in the sigmoid colon.                           - No source of melena seen; wonder if it is from                            the gastritis seen on prior endoscopy in setting of                            anticoagulation. Moderate Sedation:      None Recommendation:           - Return patient to hospital ward for ongoing care.                            - High fiber diet indefinitely.                           - Topical therapies (e.g., Preparation-H),                            avoidance of constipation/straining, liberal water                            intake, for management of hemorrhoids.                           - Resume Xarelto (rivaroxaban) at prior dose                            tomorrow. Stay on pantoprazole 40 mg po qd                            indefinitely, so long as patient is on                            anticoagulation.                           - Return to GI clinic in 2 weeks. If melena/anemia                            recurs, consider capsule endoscopy as next step in                            management.                           -  Return to referring physician as previously                            scheduled.                           - No repeat colonoscopy. Procedure Code(s):        --- Professional ---                           [phone], Colonoscopy, flexible; diagnostic, including                            collection of specimen(s) by brushing or washing,                            when performed (separate procedure) Diagnosis Code(s):        --- Professional ---                           K64.4, Residual hemorrhoidal skin tags                           K64.8, Other hemorrhoids                           K92.1, Melena (includes Hematochezia)                           D50.9, Iron deficiency anemia, unspecified                           K57.30, Diverticulosis of large intestine without                            perforation or abscess without bleeding CPT copyright 2016 [employer]. All rights reserved. The codes documented in this report are preliminary and upon coder review may  be revised to meet current compliance requirements. Arta Silence, MD 09/09/2016 1:13:44 PM This report has been signed electronically. Number of Addenda: 0

## 2016-09-09 NOTE — Transfer of Care (Signed)
Immediate Anesthesia Transfer of Care Note  Patient: Paul Price  Procedure(s) Performed: Procedure(s): COLONOSCOPY WITH PROPOFOL (Left)  Patient Location: PACU  Anesthesia Type:MAC  Level of Consciousness: awake, alert  and patient cooperative  Airway & Oxygen Therapy: Patient Spontanous Breathing  Post-op Assessment: Report given to RN and Post -op Vital signs reviewed and stable  Post vital signs: Reviewed and stable  Last Vitals:  Vitals:   09/09/16 1311 09/09/16 1312  BP: 134/64   Pulse: 77 79  Resp:  13  Temp:    SpO2: 98% 98%    Last Pain:  Vitals:   09/09/16 1223  TempSrc: Oral  PainSc:          Complications: No apparent anesthesia complications

## 2016-09-09 NOTE — Anesthesia Postprocedure Evaluation (Signed)
Anesthesia Post Note  Patient: Paul Price  Procedure(s) Performed: Procedure(s) (LRB): COLONOSCOPY WITH PROPOFOL (Left)     Patient location during evaluation: PACU Anesthesia Type: MAC Level of consciousness: awake and alert Pain management: pain level controlled Vital Signs Assessment: post-procedure vital signs reviewed and stable Respiratory status: spontaneous breathing, nonlabored ventilation and respiratory function stable Cardiovascular status: stable and blood pressure returned to baseline Anesthetic complications: no    Last Vitals:  Vitals:   09/09/16 1311 09/09/16 1312  BP: 134/64   Pulse: 77 79  Resp:  13  Temp: (!) 36.4 C   SpO2: 98% 98%    Last Pain:  Vitals:   09/09/16 1223  TempSrc: Oral  PainSc:                  Audry Pili

## 2016-09-09 NOTE — Interval H&P Note (Signed)
History and Physical Interval Note:  09/09/2016 12:45 PM  Paul Price  has presented today for surgery, with the diagnosis of anemia, black stools, unremarkable EGD  The various methods of treatment have been discussed with the patient and family. After consideration of risks, benefits and other options for treatment, the patient has consented to  Procedure(s): COLONOSCOPY WITH PROPOFOL (Left) as a surgical intervention .  The patient's history has been reviewed, patient examined, no change in status, stable for surgery.  I have reviewed the patient's chart and labs.  Questions were answered to the patient's satisfaction.     [name]

## 2016-09-09 NOTE — Anesthesia Preprocedure Evaluation (Addendum)
Anesthesia Evaluation  Patient identified by MRN, date of birth, ID band Patient awake    Reviewed: Allergy & Precautions, NPO status , Patient's Chart, lab work & pertinent test results  Airway Mallampati: III       Dental  (+) Teeth Intact, Dental Advisory Given   Pulmonary sleep apnea and Continuous Positive Airway Pressure Ventilation , former smoker,    Pulmonary exam normal        Cardiovascular hypertension, + CAD, + Cardiac Stents, + CABG, + Peripheral Vascular Disease and +CHF  Normal cardiovascular exam+ dysrhythmias Atrial Fibrillation  Rhythm:Regular Rate:Normal  TTE 08/09/16 - Left ventricle: The estimated ejection fraction was in the range of 45% to 50%. Diffuse hypokinesis. - No other notable features in report.   Neuro/Psych Lumbar spinal stenosis  Neuromuscular disease    GI/Hepatic Neg liver ROS, GERD  Controlled,  Endo/Other  Obesity  Renal/GU Renal disease     Musculoskeletal  (+) Arthritis ,   Abdominal (+) + obese,   Peds  Hematology  (+) anemia ,   Anesthesia Other Findings   Reproductive/Obstetrics                           Anesthesia Physical  Anesthesia Plan  ASA: III  Anesthesia Plan: MAC   Post-op Pain Management:    Induction: Intravenous  PONV Risk Score and Plan: 1 and Treatment may vary due to age or medical condition and Propofol infusion  Airway Management Planned: Nasal Cannula and Natural Airway  Additional Equipment:   Intra-op Plan:   Post-operative Plan:   Informed Consent: I have reviewed the patients History and Physical, chart, labs and discussed the procedure including the risks, benefits and alternatives for the proposed anesthesia with the patient or authorized representative who has indicated his/her understanding and acceptance.     Plan Discussed with: CRNA  Anesthesia Plan Comments:        Anesthesia Quick  Evaluation

## 2016-09-10 ENCOUNTER — Encounter (HOSPITAL_COMMUNITY): Payer: Self-pay | Admitting: Gastroenterology

## 2016-09-21 DIAGNOSIS — D500 Iron deficiency anemia secondary to blood loss (chronic): Secondary | ICD-10-CM | POA: Diagnosis not present

## 2016-09-21 DIAGNOSIS — Z23 Encounter for immunization: Secondary | ICD-10-CM | POA: Diagnosis not present

## 2016-09-26 DIAGNOSIS — D500 Iron deficiency anemia secondary to blood loss (chronic): Secondary | ICD-10-CM | POA: Diagnosis not present

## 2016-09-27 ENCOUNTER — Encounter: Payer: Self-pay | Admitting: Cardiovascular Disease

## 2016-09-27 ENCOUNTER — Ambulatory Visit (INDEPENDENT_AMBULATORY_CARE_PROVIDER_SITE_OTHER): Payer: Medicare Other | Admitting: Cardiovascular Disease

## 2016-09-27 VITALS — BP 122/70 | HR 60 | Ht 66.0 in | Wt 203.0 lb

## 2016-09-27 DIAGNOSIS — I2510 Atherosclerotic heart disease of native coronary artery without angina pectoris: Secondary | ICD-10-CM

## 2016-09-27 DIAGNOSIS — I4510 Unspecified right bundle-branch block: Secondary | ICD-10-CM

## 2016-09-27 DIAGNOSIS — I480 Paroxysmal atrial fibrillation: Secondary | ICD-10-CM

## 2016-09-27 DIAGNOSIS — E669 Obesity, unspecified: Secondary | ICD-10-CM

## 2016-09-27 DIAGNOSIS — E785 Hyperlipidemia, unspecified: Secondary | ICD-10-CM | POA: Diagnosis not present

## 2016-09-27 DIAGNOSIS — N183 Chronic kidney disease, stage 3 unspecified: Secondary | ICD-10-CM

## 2016-09-27 DIAGNOSIS — G4733 Obstructive sleep apnea (adult) (pediatric): Secondary | ICD-10-CM | POA: Diagnosis not present

## 2016-09-27 MED ORDER — ATORVASTATIN CALCIUM 20 MG PO TABS: 20.0000 mg | ORAL_TABLET | Freq: Every day | ORAL | 3 refills | Status: DC

## 2016-09-27 NOTE — Progress Notes (Signed)
Patient ID: Paul Price, male   DOB: July 09, 1940, 76 y.o.   MRN: [id number]     Primary MD:  Dr. [name]  HPI: Paul Price is a 76 y.o. male who presents for a 4 month cardiology followup evaluation   Paul Price has established CAD and in 1992 underwent CABG revascularization surgery LIMA to the LAD and diagonal. In 2009 he was found to have an atretic LIMA graft to the LAD and a patent vein graft supplying the diagonal vessel. He had 70% narrowing in the LAD beyond the diagonal was treated medically. In April 2012 due to progressive chest pain and scintigraphic evidence for ischemia in the mid distal LAD territory repeat catheterization was performed and he underwent successful stenting to his LAD beyond the diagonal with a 3.0x20 mm Promus DES stent postdilated 3.25 mm. Subsequent, he has remained active and has done well. His last stress test in 2013 showed an apical defect without ischemia.  Additional problems include obstructive sleep apnea on CPAP therapy, moderate obesity, hypertension, history of DVT, and hyperlipidemia.  He sees Dr. Shelia Media for primary care.  On November 19, 2012 he saw Kerin Ransom.PAC for suspected atrial fibrillation and CHF after having been seen by his primary physician and noted some increasing shortness of breath with activity. A CardioNet monitor  demonstrated  a 4.3 second pause which led to a reduction in his  beta blocker dose  from Lopressor 75 mg twice a day to 25 twice a day. He saw Tarri Fuller on 12/02/2012 at which time he was in sinus rhythm but also was found to have probably 9 beat run of nonsustained VT on monitor which is he was maintained on low-dose beta blocker therapy. Subsequent cardiac monitoring did not show any further episodes of significant bradycardia but he did have one additional 9 beat run of nonsustained PSVT at a rate of 109 beats per minute on 12/17/12. . Laboratory done by Dr. Shelia Media on 11/27/2012 showed a potassium of 4.2. His  creatinine was 1.82. BUN 33.  An echo Doppler study done 12/09/2012 showed an estimated ejection fraction at 50-55%. Although no diagnostic regional wall motion abnormalities were definitively identified, this possibility was not completely excluded. There was grade 2 diastolic dysfunction. There is mild left atrial dilatation and mild mitral regurgitation.  A renal ultrasound has  demonstrate bilateral small renal cysts.    In 2015 he was found to have a DVT in his left leg and was started on eliquis by Dr. Shelia Media.  He was hospitalized on November 23 through 12/09/2013 and presented with fevers, and sudden onset of severe back pain.  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  Blood cultures were negative.   After 1 week in the hospital, he spent 3 weeks at rehabilitation.  He was treated with 4 weeks of antibiotic therapy and after his hospitalization.  He was followed by Dr. Michel Bickers from infectious disease.  He last saw him in March 2016 and att that time he was felt to be completely cleared cured.    He has renal insufficiency and has been followed by Dr. Gillermina Phy since he had developed acute on chronic kidney injury.  His serum creatinine peaked at 2.8.  Ultimately, this has improved.    When I  saw him in follow-up, he continued to note lower extremity edema. He was no longer anticoagulation but has been on Plavix and aspirin.  I scheduled him for follow-up lower extremity venous ultrasound  which showed resolution of his left distal femoral vein and popliteal vein thrombus . His blood pressure has been treated with Toprol-XL 50 mg losartan 100 mg furosemide 40 mg and amlodipine 10 mg.  He is on lipid lowering therapy with atorvastatin.  He takes allopurinol for gout.  There is history of GERD treated with ranitidine.    He admits to using his CPAP with 100% compliance.  He states he has a new machine.  However, recently he has been noticing frequent awakenings.  He denies  awareness of breakthrough snoring.    When I last saw him, he noticed a change in symptomatology with more progressive exertional dyspnea.  He denied any exertional chest pressure.  However, he has noticed some nonexertional constant chest wall ache.  His shortness of breath was occurring with less activity.  He is unaware of palpitations.  He denies presyncope or syncope.  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   An echo Doppler study on 04/28/2016 showed an EF of 50-55%.  There was grade 2 diastolic dysfunction.  Septal motion.  She showed paradoxical motion.  The left atrium was severely dilated.  There was mild MR.  The RV was mildly dilated, as was the right atrium.  There was mild palmar hypertension with PA pressure 33.  He underwent a nuclear perfusion study to further evaluate his symptoms.  This remained low risk.  Although calculated at 47% EF, visually it appeared to be 55%.  There was a small nonreversible defect in the mid anterior and apical location without ischemia.  He also underwent lower extremity arterial Doppler studies which showed normal ABIs bilaterally and there was no evidence for segmental lower extremity arterial disease. Creatinine was 1.76 on 04/14/2016; Hemoglobin A1c was increased at 6.7.  Lipid studies revealed a TC 128, triglyceride 152, LDL 66, and he has a low HDL level of 32.    Since I last saw him, he was hospitalized on July 31 through 08/10/2016 and was found to have atrial fibrillation with rapid ventricular response as well as acute renal failure.  His creatinine had risen to 2.9 and improved to 1.9.  With gentle hydration.  His losartan and spironolactone were held.  He was started on Xarelto at discharge.  On August 29 through 09/09/2016.  He was rehospitalized with GI bleed and acute blood loss anemia.  He underwent endoscopy and colonoscopy and GI recommended continuation of  anticoagulation with concomitant Protonix therapy.  He tells me he received 3 units of blood.  During his initial hospitalization on August 1.  An echo Doppler study showed an EF of 45-50% with diffuse hypocontractility.  There was aortic sclerosis without stenosis, mitral annular calcification, and mild LA dilation.  He can 10.  Used to use CPAP.  Apparently, he is not in on atorvastatin since his hospitalization.  He presents now for cardiology follow-up evaluation.  Past Medical History:  Diagnosis Date  . Anemia   . Arthritis    "shoulders" (09/07/2016)  . Atrial fibrillation ([place])   . BPH (benign prostatic hyperplasia)   . Chronic kidney disease (CKD), stage III (moderate)   . Coronary artery disease   . DDD (degenerative disc disease), cervical   . DVT (deep venous thrombosis) ([place]) 12/2013   LLE  . GERD (gastroesophageal reflux disease)   . Gout   . High cholesterol   . History  of blood transfusion 09/06/2016  . History of scarlet fever 1940s  . History of stress test 06/2011   No significant ischemia, this is a low risk scan. Clinical correlation recommended Abnormal myocardial perfusion study.  Marland Kitchen Hx of echocardiogram 05/2009   EF 40-45%, he did have mild annular calcification with mild-to-moderate MR and mild TR as well as aortic valve sclerosis. Estimated RV systolic pressure was 21 mm.  . Hypertension   . Kidney failure   . 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 Neck pain    "not chronic" (09/07/2016)  . Obesity   . OSA on CPAP   . RBBB   . Renal insufficiency   . Spinal stenosis of lumbar region     Past Surgical History:  Procedure Laterality Date  . APPENDECTOMY    . CARDIAC CATHETERIZATION  2009   he was found to have a atretic LIMA graft to his LAD and had a patent vein graft supplying his diagonal vessel. At that time, he had 70% narrowing in his LAD beyond a diagonal vessel which was initially treated medically.  . COLONOSCOPY WITH PROPOFOL Left 09/09/2016    Procedure: COLONOSCOPY WITH PROPOFOL;  Surgeon: Arta Silence, MD;  Location: [facility];  Service: Endoscopy;  Laterality: Left;  . CORONARY ANGIOPLASTY WITH STENT PLACEMENT  April 2012   LAD DES  . CORONARY ARTERY BYPASS GRAFT  1992   with LIMA to the LAD and diagonal.  . ESOPHAGOGASTRODUODENOSCOPY (EGD) WITH PROPOFOL Left 09/08/2016   Procedure: ESOPHAGOGASTRODUODENOSCOPY (EGD) WITH PROPOFOL;  Surgeon: Ronnette Juniper, MD;  Location: [facility];  Service: Gastroenterology;  Laterality: Left;  . INGUINAL HERNIA REPAIR     "don't remeimber which side"  . TONSILLECTOMY      Allergies  Allergen Reactions  . Nitrostat [Nitroglycerin] Other (See Comments)    Causes blood pressure to "bottom out"    Current Outpatient Prescriptions  Medication Sig Dispense Refill  . allopurinol (ZYLOPRIM) 300 MG tablet Take 300 mg by mouth daily.    . fenofibrate micronized (LOFIBRA) 134 MG capsule Take 134 mg by mouth daily before breakfast.    . furosemide (LASIX) 40 MG tablet Take 20 mg by mouth daily.    . IRON, FERROUS SULFATE, PO Take 325 mg by mouth daily.     . Melatonin 5 MG TABS Take 5 mg by mouth at bedtime.    . metoprolol succinate (TOPROL-XL) 50 MG 24 hr tablet Take 50 mg by mouth 2 (two) times daily. Take with or immediately following a meal.    . pantoprazole (PROTONIX) 40 MG tablet Take 1 tablet (40 mg total) by mouth daily. 30 tablet 0  . Rivaroxaban (XARELTO) 15 MG TABS tablet Take 1 tablet (15 mg total) by mouth daily with supper. 30 tablet 1  . Tamsulosin HCl (FLOMAX) 0.4 MG CAPS Take 1 capsule (0.4 mg total) by mouth daily. 30 capsule 0  . atorvastatin (LIPITOR) 20 MG tablet Take 1 tablet (20 mg total) by mouth daily. 90 tablet 3   No current facility-administered medications for this visit.     Social History   Social History  . Marital status: Married    Spouse name: N/A  . Number of children: N/A  . Years of education: N/A   Occupational History  . Not on file.    Social History Main Topics  . Smoking status: Former Smoker    Packs/day: 1.00    Years: 30.00    Types: Cigarettes  . Smokeless  tobacco: Never Used     Comment: quit ~ 1985  . Alcohol use No  . Drug use: No  . Sexual activity: Not on file   Other Topics Concern  . Not on file   Social History Narrative  . No narrative on file    Socially he is married, has 3 children 7 grandchildren. He does not use tobacco or alcohol.  ROS General: Negative; No fevers, chills, or night sweats; Moderate obesity HEENT: Negative; No changes in vision or hearing, sinus congestion, difficulty swallowing Pulmonary: Negative; No cough, wheezing, shortness of breath, hemoptysis Cardiovascular: Negative; No chest pain, presyncope, syncope, palpatations GI: Recent GI bleed, status post endoscopy and colonoscopy without definitive abnormality. GU: Negative; No dysuria, hematuria, or difficulty voiding Musculoskeletal: History of paraspinal myositis  Hematologic/Oncology: Negative; no easy bruising, bleeding Endocrine: Negative; no heat/cold intolerance; no diabetes Neuro: Occasional paresthesias; no changes in balance, headaches Skin: Negative; No rashes or skin lesions Psychiatric: Negative; No behavioral problems, depression Sleep: Positive for sleep apnea on CPAP; No snoring, daytime sleepiness, hypersomnolence, bruxism, restless legs, hypnogognic hallucinations, no cataplexy Other comprehensive 14 point system review is negative.   PE BP 122/70   Pulse 60   Ht 5' 6"  (1.676 m)   Wt 203 lb (92.1 kg)   BMI 32.77 kg/m    Repeat blood pressure by me 130/80.  Wt Readings from Last 3 Encounters:  09/27/16 203 lb (92.1 kg)  09/09/16 203 lb 1.6 oz (92.1 kg)  08/09/16 208 lb (94.3 kg)   General: Alert, oriented, no distress.  Skin: normal turgor, no rashes, warm and dry HEENT: Normocephalic, atraumatic. Pupils equal round and reactive to light; sclera anicteric; extraocular muscles intact;   Nose without nasal septal hypertrophy Mouth/Parynx benign; Mallinpatti scale 3 Neck: No JVD, no carotid bruits; normal carotid upstroke Lungs: clear to ausculatation and percussion; no wheezing or rales Chest wall: without tenderness to palpitation Heart: PMI not displaced, RRR, s1 s2 normal, 1/6 systolic murmur, no diastolic murmur, no rubs, gallops, thrills, or heaves Abdomen: soft, nontender; no hepatosplenomehaly, BS+; abdominal aorta nontender and not dilated by palpation. Back: no CVA tenderness Pulses 2+ Musculoskeletal: full range of motion, normal strength, no joint deformities Extremities: Trace ankle edema no clubbing cyanosis, Homan's sign negative  Neurologic: grossly nonfocal; Cranial nerves grossly wnl Psychologic: Normal mood and affect   ECG (independently read by me): normal sinus rhythm at 60 bpm.  Incomplete right bundle branch block.  Mild inferolateral ST changes.  May 2018 ECG (independently read by me): sinus bradycardia 58 bpm.  Left axis deviation.  Right bundle branch block with repolarization changes.  QTc interval 471 ms.  April 2018 ECG (independently read by me):NSR At 61, left axis deviation.  Right bundle branch block.  September 2017 ECG (independently read by me): Normal sinus rhythm with mild sinus arrhythmia.  First degree AV block with a PR interval at 212 ms.  Right bundle-branch block.  March 2017 ECG (independently read by me): Normal sinus rhythm at 61 bpm.  Right bundle branch block with repolarization changes.  QTc interval 477 ms.  PR interval 204 ms.  October 2016 ECG (independently read by me): Normal sinus rhythm at 65 bpm.  Right bundle branch block.  First degree AV block.  June 2015 ECG (independently read by me): Normal sinus rhythm at 61 beats per minute.  Right bundle branch block.  Mild voltage criteria for LVH in aVL  ECG (independently read by me): Sinus rhythm at 60 beats per  minute. Right bundle branch block; PAC, inferolateral  T abnormalities.  LABS: I personally reviewed the lab work from 04/14/2016 done at [facility] 09/07/2016 09/06/2016 09/06/2016  Glucose 65 - 99 mg/dL 75 123(H) 130(H)  BUN 6 - 20 mg/dL 46(H) 53(H) 54(H)  Creatinine 0.61 - 1.24 mg/dL 2.04(H) 2.10(H) 2.11(H)  Sodium 135 - 145 mmol/L 143 138 138  Potassium 3.5 - 5.1 mmol/L 4.3 4.2 4.4  Chloride 101 - 111 mmol/L 115(H) 107 111  CO2 22 - 32 mmol/L 23 - 21(L)  Calcium 8.9 - 10.3 mg/dL 8.2(L) - 8.2(L)   Hepatic Function Latest Ref Rng & Units 09/07/2016 09/06/2016 10/27/2015  Total Protein 6.5 - 8.1 g/dL 4.9(L) 5.1(L) 6.9  Albumin 3.5 - 5.0 g/dL 2.8(L) 3.0(L) 4.2  AST 15 - 41 U/L 19 21 24   ALT 17 - 63 U/L 16(L) 17 21  Alk Phosphatase 38 - 126 U/L 48 49 97  Total Bilirubin 0.3 - 1.2 mg/dL 0.5 0.4 0.5   CBC Latest Ref Rng & Units 09/08/2016 09/08/2016 09/07/2016  WBC 4.0 - 10.5 K/uL 6.2 6.1 6.5  Hemoglobin 13.0 - 17.0 g/dL 8.8(L) 8.1(L) 8.0(L)  Hematocrit 39.0 - 52.0 % 28.0(L) 25.6(L) 24.7(L)  Platelets 150 - 400 K/uL 204 189 201   Lab Results  Component Value Date   MCV 90.0 09/08/2016   MCV 89.5 09/08/2016   MCV 88.8 09/07/2016   Lab Results  Component Value Date   TSH 4.07 10/27/2015   No results found for: HGBA1C   Lipid Panel     Component Value Date/Time   CHOL 141 10/27/2015 0803   TRIG 192 (H) 10/27/2015 0803   HDL 28 (L) 10/27/2015 0803   CHOLHDL 5.0 10/27/2015 0803   VLDL 38 (H) 10/27/2015 0803   LDLCALC 75 10/27/2015 0803     BNP No results found for: PROBNP  Lipid Panel     Component Value Date/Time   CHOL 141 10/27/2015 0803   Lipid Panel     Component Value Date/Time   CHOL 141 10/27/2015 0803   TRIG 192 (H) 10/27/2015 0803   HDL 28 (L) 10/27/2015 0803   CHOLHDL 5.0 10/27/2015 0803   VLDL 38 (H) 10/27/2015 0803   LDLCALC 75 10/27/2015 0803    RADIOLOGY: No results found.  IMPRESSION:  1. PAF (paroxysmal atrial fibrillation) ([place])   2. RBBB   3. Coronary artery  disease involving native coronary artery of native heart without angina pectoris   4. Hyperlipidemia with target LDL less than 70   5. Stage III chronic kidney disease   6. OSA (obstructive sleep apnea)   7. Obesity (BMI 30.0-34.9)     ASSESSMENT AND PLAN: [name] is a 76 year old Caucasian male who is 26 years status post CABG revascularization surgery in 1992 and 6 years since a stent was placed in the LAD beyond this diagonal vessel in 2012. He has a documented atretic LIMA graft and a patent vein graft which supplies this diagonal graft.   His prior echo Doppler study in 2013  showed an ejection fraction of 50-55% and probable grade 2 diastolic dysfunction. He did have mild left atrial dilatation and mild mitral regurgitation.  On his echo Doppler study in April 2018 Ejection fraction remained stable at 30-09%; grade 2 diastolic dysfunction, and there was evidence for  severe left atrial enlargement with mild right atrial enlargement.  Mild pulmonary hypertension.  His nuclear perfusion study remained in the low risk.  There  is a small defect in the mid apical and anterior wall consistent with prior infarction without associated ischemia.  He has stage III chronic kidney disease, and most recent creatinine was 1.76.  When I last saw him I was concerned that he was developing overt diabetes with an increased hemoglobin A1c at 6.7 and recommended follow-up with Dr. Shelia Media.  He was recently admitted with atrial fibrillation with rapid ventricular response for which she was started on anticoagulation therapy with Xarelto.  In addition, he was found to have acute kidney injury  and his spironolactone and ARB therapy were held.  Unfortunately, he developed a GI bleed with systemic anticoagulation for which he tells me he received 3 units of transfusion.  He was not found to have a major source on endoscopy and colonoscopy and is now back on Xarelto.  He denies recurrent bleeding.  He will be having a  follow-up renal evaluation next week with Dr. Veronia Beets October.  Apparently during his hospitalization his atorvastatin had been discontinued.  I have recommended resumption of this at 20 mg with target LDL less than 70. He is not having any anginal type symptoms.  He has sleep apnea and continues to use CPAP with 100% compliance.  He tells me yesterday.  He had blood work drawn with Dr. Shelia Media.  Iwill  try to obtain these results.  I will see him in 3-4 months for reevaluation.    Troy Sine, MD, [facility]  10/02/2016 1:26 PM

## 2016-09-27 NOTE — Patient Instructions (Signed)
Medication Instructions:  RESTART atorvastatin 20mg  (1 tablet) daily  Follow-Up: Your physician recommends that you schedule a follow-up appointment in: 3-4 MONTHS with Dr. Claiborne Billings.    Any Other Special Instructions Will Be Listed Below (If Applicable).     If you need a refill on your cardiac medications before your next appointment, please call your pharmacy.

## 2016-10-04 DIAGNOSIS — D649 Anemia, unspecified: Secondary | ICD-10-CM | POA: Diagnosis not present

## 2016-10-13 DIAGNOSIS — N184 Chronic kidney disease, stage 4 (severe): Secondary | ICD-10-CM | POA: Diagnosis not present

## 2016-10-24 DIAGNOSIS — I10 Essential (primary) hypertension: Secondary | ICD-10-CM | POA: Diagnosis not present

## 2016-10-24 DIAGNOSIS — D509 Iron deficiency anemia, unspecified: Secondary | ICD-10-CM | POA: Diagnosis not present

## 2016-10-24 DIAGNOSIS — D500 Iron deficiency anemia secondary to blood loss (chronic): Secondary | ICD-10-CM | POA: Diagnosis not present

## 2016-11-20 ENCOUNTER — Other Ambulatory Visit: Payer: Self-pay

## 2016-11-20 DIAGNOSIS — R7989 Other specified abnormal findings of blood chemistry: Secondary | ICD-10-CM

## 2016-11-20 NOTE — Progress Notes (Signed)
bmet  

## 2016-11-22 DIAGNOSIS — R7989 Other specified abnormal findings of blood chemistry: Secondary | ICD-10-CM | POA: Diagnosis not present

## 2016-11-22 LAB — BASIC METABOLIC PANEL
BUN/Creatinine Ratio: 19 (ref 10–24)
BUN: 44 mg/dL — ABNORMAL HIGH (ref 8–27)
CO2: 22 mmol/L (ref 20–29)
Calcium: 9.3 mg/dL (ref 8.6–10.2)
Chloride: 107 mmol/L — ABNORMAL HIGH (ref 96–106)
Creatinine, Ser: 2.27 mg/dL — ABNORMAL HIGH (ref 0.76–1.27)
GFR calc Af Amer: 31 mL/min/{1.73_m2} — ABNORMAL LOW (ref >59)
GFR calc non Af Amer: 27 mL/min/{1.73_m2} — ABNORMAL LOW (ref >59)
Glucose: 129 mg/dL — ABNORMAL HIGH (ref 65–99)
Potassium: 3.9 mmol/L (ref 3.5–5.2)
Sodium: 146 mmol/L — ABNORMAL HIGH (ref 134–144)

## 2016-11-28 DIAGNOSIS — G4733 Obstructive sleep apnea (adult) (pediatric): Secondary | ICD-10-CM | POA: Diagnosis not present

## 2016-12-04 ENCOUNTER — Ambulatory Visit: Payer: Medicare Other | Admitting: Physician Assistant

## 2016-12-04 ENCOUNTER — Telehealth: Payer: Self-pay | Admitting: Cardiovascular Disease

## 2016-12-04 ENCOUNTER — Encounter: Payer: Self-pay | Admitting: Physician Assistant

## 2016-12-04 VITALS — BP 136/72 | HR 40 | Ht 66.0 in | Wt 203.0 lb

## 2016-12-04 DIAGNOSIS — R531 Weakness: Secondary | ICD-10-CM

## 2016-12-04 DIAGNOSIS — T50905A Adverse effect of unspecified drugs, medicaments and biological substances, initial encounter: Secondary | ICD-10-CM

## 2016-12-04 DIAGNOSIS — I480 Paroxysmal atrial fibrillation: Secondary | ICD-10-CM | POA: Diagnosis not present

## 2016-12-04 DIAGNOSIS — R001 Bradycardia, unspecified: Secondary | ICD-10-CM | POA: Diagnosis not present

## 2016-12-04 DIAGNOSIS — R42 Dizziness and giddiness: Secondary | ICD-10-CM | POA: Diagnosis not present

## 2016-12-04 NOTE — Telephone Encounter (Signed)
New message  Pt c/o Shortness Of Breath: STAT if SOB developed within the last 24 hours or pt is noticeably SOB on the phone  1. Are you currently SOB (can you hear that pt is SOB on the phone)? Yes   2. How long have you been experiencing SOB? Per pt states he has been dealing with SOB for a while. Pt states RN Malachy Mood is aware.   3. Are you SOB when sitting or when up moving around?   4. Are you currently experiencing any other symptoms? No  Per pt states the medication adjustment did not work and he is still having SOB. Please call back to discuss

## 2016-12-04 NOTE — Telephone Encounter (Signed)
Pt of Dr. Claiborne Billings Notes he has been SOB 2 weeks, also notes increased fatigue.  Dyspnea on exertion and then orthopnea when lying flat. Trouble walking distances (<100 feet)  States he spoke to a nurse last week who recommended a change to his medication - to discontinue/hold lasix, however I do not see documentation of this call. He is fairly sure he called our office, but also states he had spoken w Dr. Pennie Banter office last week, who had called in an Rx for oral potassium  States based on phone call last week, he he cut lasix out completely last week - was taking 40mg  once daily.  states he's weighing himself 2 times a day and doesn't note any weight changes.  --------------- This AM BP 160/62 (states this is typical recently, though he notes systolic is high for him)  O2 95-96%  HR 55 -----------------  Pt agreeable to come in today, & I have added to Rhonda's schedule. I've also called Dr. Pennie Banter office, was put through to medical records and I left a detailed msg to request most recent visit notes/labs.

## 2016-12-04 NOTE — Patient Instructions (Addendum)
Medication Instructions:  Decrease metoprolol 25mg  twice daily If you need a refill on your cardiac medications before your next appointment, please call your pharmacy.  Special Instructions: Nurse will call you 12-25-2016 for blood pressures/heart rate/weight for review with MD  Call this week if you are not better.   Follow-Up: Your physician wants you to follow-up in: AS SCHEDULED WITH DR Claiborne Billings.    Thank you for choosing [facility] at [facility]!!

## 2016-12-04 NOTE — Progress Notes (Signed)
Cardiology Office Note   Date:  12/04/2016   ID:  Paul Price, DOB 08/21/1940, MRN [id number]  PCP:  [name], MD  Cardiologist: Dr. Claiborne Billings, 09/27/2016 Rosaria Ferries, PA-C   Chief Complaint  Patient presents with  . Shortness of Breath    History of Present Illness: Paul Price is a 76 y.o. male with a history of CABG 1992 w/ LIMA-LAD, SVG-Diag, LIMA atretic at cath 2009 w/ SVG ok, stent LAD 2012, MV 2013 w/out ischemia, OSA on CPAP, HTN, DVT 2015, HLD, obesity, gout, RBBB, EF 45-50% echo 08/09/2016, ARF>>CKD, peak Cr 2.8 now off Spironolactone and ACE/ARB, GERD, GI bleed on anticoagulation  07/2016 admit for rapid atrial fibrillation, patient started on Xarelto, echo performed with normal EF 09/27/2016 office visit, Lipitor 20 mg restarted, patient asymptomatic  Paul Price presents for cardiology follow up.  He was doing well in September, but has begun to feel worse. He has been feeling worse for over a month.  His breathing has been getting worse. He has no energy. He has had near syncope with exertion. That improves with rest.  He is SOB when he lays down, even with the CPAP. That gets better if he lays on his R side, but he still has not been able to sleep.   No chest pain, but his exertion level has been poor.   He has not had LE edema. No weight gain. No sodium indiscretion.    Past Medical History:  Diagnosis Date  . Anemia   . Arthritis    "shoulders" (09/07/2016)  . Atrial fibrillation ([place])   . BPH (benign prostatic hyperplasia)   . Chronic kidney disease (CKD), stage III (moderate) ([facility])   . Coronary artery disease   . DDD (degenerative disc disease), cervical   . DVT (deep venous thrombosis) ([place]) 12/2013   LLE  . GERD (gastroesophageal reflux disease)   . Gout   . High cholesterol   . History of blood transfusion 09/06/2016  . History of scarlet fever 1940s  . History of stress test 06/2011   No significant ischemia, this is a low  risk scan. Clinical correlation recommended Abnormal myocardial perfusion study.  Marland Kitchen Hx of echocardiogram 05/2009   EF 40-45%, he did have mild annular calcification with mild-to-moderate MR and mild TR as well as aortic valve sclerosis. Estimated RV systolic pressure was 21 mm.  . Hypertension   . Kidney failure   . 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 Neck pain    "not chronic" (09/07/2016)  . Obesity   . OSA on CPAP   . RBBB   . Renal insufficiency   . Spinal stenosis of lumbar region     Past Surgical History:  Procedure Laterality Date  . APPENDECTOMY    . CARDIAC CATHETERIZATION  2009   he was found to have a atretic LIMA graft to his LAD and had a patent vein graft supplying his diagonal vessel. At that time, he had 70% narrowing in his LAD beyond a diagonal vessel which was initially treated medically.  . COLONOSCOPY WITH PROPOFOL Left 09/09/2016   Procedure: COLONOSCOPY WITH PROPOFOL;  Surgeon: Arta Silence, MD;  Location: [facility];  Service: Endoscopy;  Laterality: Left;  . CORONARY ANGIOPLASTY WITH STENT PLACEMENT  April 2012   LAD DES  . CORONARY ARTERY BYPASS GRAFT  1992   with LIMA to the LAD and diagonal.  . ESOPHAGOGASTRODUODENOSCOPY (EGD) WITH PROPOFOL Left 09/08/2016   Procedure:  ESOPHAGOGASTRODUODENOSCOPY (EGD) WITH PROPOFOL;  Surgeon: Ronnette Juniper, MD;  Location: [facility];  Service: Gastroenterology;  Laterality: Left;  . INGUINAL HERNIA REPAIR     "don't remeimber which side"  . TONSILLECTOMY      Current Outpatient Medications  Medication Sig Dispense Refill  . allopurinol (ZYLOPRIM) 300 MG tablet Take 300 mg by mouth daily.    Marland Kitchen atorvastatin (LIPITOR) 20 MG tablet Take 1 tablet (20 mg total) by mouth daily. 90 tablet 3  . esomeprazole (NEXIUM) 20 MG capsule Take 20 mg by mouth daily at 12 noon. Patient switches between protonix and nexium depending on what the pharmancy has in stock    . fenofibrate micronized (LOFIBRA) 134 MG capsule Take  134 mg by mouth daily before breakfast.    . losartan (COZAAR) 100 MG tablet Take 1 tablet by mouth every morning.    . Melatonin 5 MG TABS Take 5 mg by mouth at bedtime.    . metoprolol succinate (TOPROL-XL) 50 MG 24 hr tablet Take 50 mg by mouth 2 (two) times daily. Take with or immediately following a meal.    . pantoprazole (PROTONIX) 40 MG tablet Take 1 tablet (40 mg total) by mouth daily. 30 tablet 0  . Rivaroxaban (XARELTO) 15 MG TABS tablet Take 1 tablet (15 mg total) by mouth daily with supper. 30 tablet 1  . Tamsulosin HCl (FLOMAX) 0.4 MG CAPS Take 1 capsule (0.4 mg total) by mouth daily. 30 capsule 0   No current facility-administered medications for this visit.     Allergies:   Nitrostat [nitroglycerin]    Social History:  The patient  reports that he has quit smoking. His smoking use included cigarettes. He has a 30.00 pack-year smoking history. he has never used smokeless tobacco. He reports that he does not drink alcohol or use drugs.   Family History:  The patient's family history includes Heart disease in his father.    ROS:  Please see the history of present illness. All other systems are reviewed and negative.    PHYSICAL EXAM: VS:  BP 136/72   Pulse (!) 40   Ht 5\' 6"  (1.676 m)   Wt 203 lb (92.1 kg)   BMI 32.77 kg/m  , BMI Body mass index is 32.77 kg/m. GEN: Well nourished, well developed, male in no acute distress  HEENT: normal for age  Neck: no JVD, no carotid bruit, no masses Cardiac: Irreg R&R; soft murmur, no rubs, or gallops Respiratory:  clear to auscultation bilaterally, normal work of breathing GI: soft, nontender, nondistended, + BS MS: no deformity or atrophy; no edema; distal pulses are 2+ in all 4 extremities   Skin: warm and dry, no rash Neuro:  Strength and sensation are intact Psych: euthymic mood, full affect   EKG:  EKG is ordered today. The ekg ordered today demonstrates sinus bradycardia with an underlying heart rate of 50, dropped  beats and junctional escape beats.  Echo: 08/09/2016 - Left ventricle: The cavity size was normal. Wall thickness was   normal. Indeterminant diastolic function (atrial fibrillation).   Systolic function was mildly reduced. The estimated ejection   fraction was in the range of 45% to 50%. Diffuse hypokinesis. - Aortic valve: There was no stenosis. - Mitral valve: Mildly calcified annulus. There was trivial regurgitation. - Left atrium: The atrium was mildly dilated. - Right ventricle: The cavity size was mildly dilated. Systolic   function was normal. - Right atrium: The atrium was mildly dilated. - Tricuspid valve:  Peak RV-RA gradient (S): 20 mm Hg. - Pulmonary arteries: PA peak pressure: 23 mm Hg (S). - Inferior vena cava: The vessel was normal in size. The   respirophasic diameter changes were in the normal range (>= 50%),   consistent with normal central venous pressure. Impressions: - The patient was in atrial fibrillation. Normal LV size with EF   45-50%, mild diffuse hypokinesis. Mildly dilated RV with normal   systolic function. No significant valvular abnormalities.  MYOVIEW: 05/04/2016 Study Highlights   The left ventricular ejection fraction is mildly decreased (45-54%).  Nuclear stress EF is calculated at 47% but appears to be at 55%.  There was no ST segment deviation noted during stress.  There is a small defect of mild severity present in the mid anterior and apical anterior location. 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 No ischemia noted.  This is a low risk study.     ANGIOGRAPHIC DATA: 1. Left main coronary artery was a long vessel which had mild 10% to     less than 20% ostial smooth narrowing.  The left main coronary     artery bifurcated into an LAD and left circumflex system. 2. The LAD gave rise to a proximal diagonal vessel which had been     bypassed with the previous vein graft.  Just after this diagonal     vessel, there  was a moderate-sized septal perforating artery and     following this was 75% to 80% area of narrowing in the LAD.  The     remainder of the LAD was free of significant disease. 3. The circumflex vessel was angiographically normal and gave rise to     one major marginal vessel. 4. The right coronary artery was angiographically normal dominant     vessel which supplied the PDA and inferior LV branch and large     posterolateral system. 5. The vein graft supplying the diagonal vessel was widely patent. 6. The LIMA graft which had supplied the LAD was atretic and did not     reach the mid LAD. 7. The RAO ventriculography revealed preserved global contractility     with ejection fraction of at least 55% with only a very minimal     area of mild mid anterolateral hypocontractility. Following percutaneous coronary intervention to the native LAD with PTCA, stenting with a 3.0- x 20-mm DES Promus Element stent postdilated to 3.25 mm, the 75% to 80% stenosis was reduced to 0%.  There was brisk TIMI 3 flow.  There was no evidence for dissection. IMPRESSION: 1. Normal left ventricular function with a very minimal zone of mid     anterolateral hypocontractility. 2. A 10% to 20% narrowing in the ostium of the left main with 75%     stenosis in the mid left anterior descending after the bypassed     diagonal vessel and septal perforating artery. 3. Normal circumflex system. 4. Normal dominant right coronary artery. 5. Patent vein graft supplying the diagonal vessel. 6. Atretic left internal mammary artery graft which had previously     supplied the left anterior descending. 7. Successful percutaneous coronary intervention to the left anterior     descending with the 75% to 80% stenosis being reduced to 0% with     ultimate insertion of a 3.0- x 20-mm drug-eluting stent Promus     Element stent postdilated to 3.25 mm. 8. Angiomax/60 mg oral Effient/IC nitroglycerin.   Recent Labs: 08/08/2016:  B Natriuretic  Peptide 335.1 09/07/2016: ALT 16 09/08/2016: Hemoglobin 8.8; Platelets 204 11/22/2016: BUN 44; Creatinine, Ser 2.27; Potassium 3.9; Sodium 146    Lipid Panel    Component Value Date/Time   CHOL 141 10/27/2015 0803   TRIG 192 (H) 10/27/2015 0803   HDL 28 (L) 10/27/2015 0803   CHOLHDL 5.0 10/27/2015 0803   VLDL 38 (H) 10/27/2015 0803   LDLCALC 75 10/27/2015 0803     Wt Readings from Last 3 Encounters:  12/04/16 203 lb (92.1 kg)  09/27/16 203 lb (92.1 kg)  09/09/16 203 lb 1.6 oz (92.1 kg)     Other studies Reviewed: Additional studies/ records that were reviewed today include: Office notes, hospital records and testing.  ASSESSMENT AND PLAN:  1.  Bradycardia: He is symptomatic at times with a heart rate that registers in the 30s.  His actual heart rate is 50s per our ECG at this time.  He is maintaining a blood pressure.  Discussed the patient with Dr. [place] who agrees that he does not need admission.  We will start by decreasing his beta-blocker by 50% and if that does not work, it will have to be discontinued.  2.  PAF: He has not had any palpitations he is not aware of his heart skipping at this time.  Continue to monitor.  Because of his history of PAF, I would like to keep the beta-blocker on board at whatever level he can tolerate.  He is to call us on Friday if he is not any better.  3.  Weakness and orthostatic symptoms: If he continues to have symptoms, once his heart rate is normalized, consider further testing.  For now, he has no indication on exam that he is anemic.  He has no volume overload.  His resting blood pressure is normal or elevated.  He has no signs or symptoms of dehydration.   Current medicines are reviewed at length with the patient today.  The patient does not have concerns regarding medicines.  The following changes have been made: decrease metoprolol  Labs/ tests ordered today include:   Orders Placed This Encounter  Procedures  . EKG  12-Lead     Disposition:   FU with Dr. Claiborne Billings  Signed, Rosaria Ferries, PA-C  12/04/2016 5:31 PM    [facility] Phone: [phone]; Fax: [phone]  This note was written with the assistance of speech recognition software. Please excuse any transcriptional errors.

## 2016-12-07 ENCOUNTER — Telehealth: Payer: Self-pay | Admitting: Cardiovascular Disease

## 2016-12-07 MED ORDER — AMLODIPINE BESYLATE 5 MG PO TABS: ORAL_TABLET | ORAL | 0 refills | Status: DC

## 2016-12-07 NOTE — Telephone Encounter (Signed)
Returned the call to the patient. He stated that he was still having a problem with shortness of breath since his last office visit. At that time his metoprolol had been reduced and he was instructed to call back if the shortness of breath did not get better.   Today his blood pressure was 180/89 and HR was 71  Blood pressure yesterday was 176/86  HR 50  Tuesday it was 179/72 and heart rate was 49  Per Rosaria Ferries, PA, he should start Amlodipine 5 mg. He will take half a tablet for the first three days and then start the 5 mg after that. He will call back with an update. He verbalized his understanding.

## 2016-12-07 NOTE — Telephone Encounter (Signed)
New Message     Pt states he was to let Suanne Marker B know how he is doing.  Pt c/o Shortness Of Breath: STAT if SOB developed within the last 24 hours or pt is noticeably SOB on the phone  1. Are you currently SOB (can you hear that pt is SOB on the phone)? no  2. How long have you been experiencing SOB?  Several weeks -seen Suanne Marker Monday and she took him off some of his medication and it has not improved at all   3. Are you SOB when sitting or when up moving around? both  4. Are you currently experiencing any other symptoms?  No other symptoms

## 2016-12-12 ENCOUNTER — Telehealth: Payer: Self-pay | Admitting: Cardiovascular Disease

## 2016-12-12 MED ORDER — AMLODIPINE BESYLATE 10 MG PO TABS: 10.0000 mg | ORAL_TABLET | Freq: Every day | ORAL | 5 refills | Status: DC

## 2016-12-12 MED ORDER — METOPROLOL SUCCINATE ER 25 MG PO TB24: 25.0000 mg | ORAL_TABLET | Freq: Every day | ORAL | 5 refills | Status: DC

## 2016-12-12 NOTE — Telephone Encounter (Signed)
Returned call to patient of Dr. Claiborne Billings. He has been monitoring BP/HR/weight He reports continued SOB, complains of weakness - symptoms unchanged from visit last week  11/27 BP 173/87  HR 50  Weight 199lbs BP 171/74  HR 46  Weight 199lbs 11/28 BP 161/75  HR 45  Weight 199.1 lbs ** started amlodipine on 11/29 12/1 BP 138/64  HR 50  Weight 200lbs 12/2 BP 166/86  HR 72 - felt good this day, more energy - a few hours later felt sluggish (HR 38) Weight 200.2lbs 12/3 BP 171/74  HR 47  Weight 200.3lbs 12/4 BP 133/54  HR 39  Weight 200.3lbs  ** should he increase amlodipine or losartan and decrease metoprolol succinate dose? Routed to [place], PA/Dr. Claiborne Billings

## 2016-12-12 NOTE — Telephone Encounter (Signed)
Patient called w/PA recommendations. He voiced understanding. Rx(s) updated with pharmacy.

## 2016-12-12 NOTE — Telephone Encounter (Signed)
Patient calling, states that he was instructed to call back with BP information. Please f/u

## 2016-12-12 NOTE — Telephone Encounter (Signed)
He should be on Toprol XL 50 mg qd Cut this (or whatever BB dose he has) in half Double the amlodipine. Thanks

## 2016-12-13 DIAGNOSIS — N184 Chronic kidney disease, stage 4 (severe): Secondary | ICD-10-CM | POA: Diagnosis not present

## 2016-12-20 ENCOUNTER — Telehealth: Payer: Self-pay | Admitting: Cardiovascular Disease

## 2016-12-20 ENCOUNTER — Other Ambulatory Visit: Payer: Self-pay

## 2016-12-20 ENCOUNTER — Inpatient Hospital Stay (HOSPITAL_COMMUNITY)
Admission: EM | Admit: 2016-12-20 | Discharge: 2016-12-26 | DRG: 242 | Disposition: A | Discharge status: Home / Self Care | Payer: Medicare Other | Attending: Internal Medicine | Admitting: Internal Medicine

## 2016-12-20 ENCOUNTER — Encounter (HOSPITAL_COMMUNITY): Payer: Self-pay | Admitting: Emergency Medicine

## 2016-12-20 ENCOUNTER — Emergency Department (HOSPITAL_COMMUNITY): Payer: Medicare Other

## 2016-12-20 DIAGNOSIS — I480 Paroxysmal atrial fibrillation: Secondary | ICD-10-CM | POA: Diagnosis not present

## 2016-12-20 DIAGNOSIS — Z951 Presence of aortocoronary bypass graft: Secondary | ICD-10-CM

## 2016-12-20 DIAGNOSIS — I10 Essential (primary) hypertension: Secondary | ICD-10-CM | POA: Diagnosis not present

## 2016-12-20 DIAGNOSIS — T502X5A Adverse effect of carbonic-anhydrase inhibitors, benzothiadiazides and other diuretics, initial encounter: Secondary | ICD-10-CM | POA: Diagnosis not present

## 2016-12-20 DIAGNOSIS — I5043 Acute on chronic combined systolic (congestive) and diastolic (congestive) heart failure: Secondary | ICD-10-CM | POA: Diagnosis not present

## 2016-12-20 DIAGNOSIS — G4733 Obstructive sleep apnea (adult) (pediatric): Secondary | ICD-10-CM | POA: Diagnosis present

## 2016-12-20 DIAGNOSIS — R0602 Shortness of breath: Secondary | ICD-10-CM | POA: Diagnosis not present

## 2016-12-20 DIAGNOSIS — E876 Hypokalemia: Secondary | ICD-10-CM | POA: Diagnosis not present

## 2016-12-20 DIAGNOSIS — I441 Atrioventricular block, second degree: Secondary | ICD-10-CM

## 2016-12-20 DIAGNOSIS — N183 Chronic kidney disease, stage 3 (moderate): Secondary | ICD-10-CM | POA: Diagnosis present

## 2016-12-20 DIAGNOSIS — Z86718 Personal history of other venous thrombosis and embolism: Secondary | ICD-10-CM | POA: Diagnosis not present

## 2016-12-20 DIAGNOSIS — Z955 Presence of coronary angioplasty implant and graft: Secondary | ICD-10-CM | POA: Diagnosis not present

## 2016-12-20 DIAGNOSIS — I5023 Acute on chronic systolic (congestive) heart failure: Secondary | ICD-10-CM

## 2016-12-20 DIAGNOSIS — Z79899 Other long term (current) drug therapy: Secondary | ICD-10-CM

## 2016-12-20 DIAGNOSIS — Z7901 Long term (current) use of anticoagulants: Secondary | ICD-10-CM | POA: Diagnosis not present

## 2016-12-20 DIAGNOSIS — R300 Dysuria: Secondary | ICD-10-CM | POA: Diagnosis not present

## 2016-12-20 DIAGNOSIS — I959 Hypotension, unspecified: Secondary | ICD-10-CM | POA: Diagnosis present

## 2016-12-20 DIAGNOSIS — I495 Sick sinus syndrome: Secondary | ICD-10-CM | POA: Diagnosis not present

## 2016-12-20 DIAGNOSIS — R001 Bradycardia, unspecified: Secondary | ICD-10-CM | POA: Diagnosis present

## 2016-12-20 DIAGNOSIS — R55 Syncope and collapse: Secondary | ICD-10-CM | POA: Diagnosis not present

## 2016-12-20 DIAGNOSIS — I2510 Atherosclerotic heart disease of native coronary artery without angina pectoris: Secondary | ICD-10-CM | POA: Diagnosis not present

## 2016-12-20 DIAGNOSIS — Z8249 Family history of ischemic heart disease and other diseases of the circulatory system: Secondary | ICD-10-CM | POA: Diagnosis not present

## 2016-12-20 DIAGNOSIS — I358 Other nonrheumatic aortic valve disorders: Secondary | ICD-10-CM | POA: Diagnosis present

## 2016-12-20 DIAGNOSIS — E611 Iron deficiency: Secondary | ICD-10-CM | POA: Diagnosis present

## 2016-12-20 DIAGNOSIS — I498 Other specified cardiac arrhythmias: Secondary | ICD-10-CM | POA: Diagnosis not present

## 2016-12-20 DIAGNOSIS — R0609 Other forms of dyspnea: Secondary | ICD-10-CM

## 2016-12-20 DIAGNOSIS — I5020 Unspecified systolic (congestive) heart failure: Secondary | ICD-10-CM | POA: Diagnosis present

## 2016-12-20 DIAGNOSIS — K219 Gastro-esophageal reflux disease without esophagitis: Secondary | ICD-10-CM | POA: Diagnosis not present

## 2016-12-20 DIAGNOSIS — I5021 Acute systolic (congestive) heart failure: Secondary | ICD-10-CM | POA: Diagnosis not present

## 2016-12-20 DIAGNOSIS — Z950 Presence of cardiac pacemaker: Secondary | ICD-10-CM | POA: Diagnosis not present

## 2016-12-20 DIAGNOSIS — E785 Hyperlipidemia, unspecified: Secondary | ICD-10-CM | POA: Diagnosis present

## 2016-12-20 DIAGNOSIS — Z959 Presence of cardiac and vascular implant and graft, unspecified: Secondary | ICD-10-CM | POA: Diagnosis not present

## 2016-12-20 DIAGNOSIS — R0600 Dyspnea, unspecified: Secondary | ICD-10-CM

## 2016-12-20 DIAGNOSIS — I4891 Unspecified atrial fibrillation: Secondary | ICD-10-CM | POA: Diagnosis present

## 2016-12-20 DIAGNOSIS — I130 Hypertensive heart and chronic kidney disease with heart failure and stage 1 through stage 4 chronic kidney disease, or unspecified chronic kidney disease: Principal | ICD-10-CM | POA: Diagnosis present

## 2016-12-20 DIAGNOSIS — D649 Anemia, unspecified: Secondary | ICD-10-CM | POA: Diagnosis not present

## 2016-12-20 DIAGNOSIS — J9601 Acute respiratory failure with hypoxia: Secondary | ICD-10-CM | POA: Diagnosis not present

## 2016-12-20 DIAGNOSIS — R069 Unspecified abnormalities of breathing: Secondary | ICD-10-CM | POA: Diagnosis not present

## 2016-12-20 DIAGNOSIS — I361 Nonrheumatic tricuspid (valve) insufficiency: Secondary | ICD-10-CM | POA: Diagnosis not present

## 2016-12-20 DIAGNOSIS — I071 Rheumatic tricuspid insufficiency: Secondary | ICD-10-CM | POA: Diagnosis not present

## 2016-12-20 LAB — URINALYSIS, ROUTINE W REFLEX MICROSCOPIC
Bacteria, UA: NONE SEEN
Bilirubin Urine: NEGATIVE
Glucose, UA: NEGATIVE mg/dL
Ketones, ur: NEGATIVE mg/dL
Leukocytes, UA: NEGATIVE
Nitrite: NEGATIVE
Protein, ur: >=300 mg/dL — AB
Specific Gravity, Urine: 1.019 (ref 1.005–1.030)
pH: 5 (ref 5.0–8.0)

## 2016-12-20 LAB — BASIC METABOLIC PANEL
Anion gap: 10 (ref 5–15)
BUN: 29 mg/dL — ABNORMAL HIGH (ref 6–20)
CO2: 19 mmol/L — ABNORMAL LOW (ref 22–32)
Calcium: 8.9 mg/dL (ref 8.9–10.3)
Chloride: 112 mmol/L — ABNORMAL HIGH (ref 101–111)
Creatinine, Ser: 1.89 mg/dL — ABNORMAL HIGH (ref 0.61–1.24)
GFR calc Af Amer: 38 mL/min — ABNORMAL LOW (ref >60)
GFR calc non Af Amer: 32 mL/min — ABNORMAL LOW (ref >60)
Glucose, Bld: 102 mg/dL — ABNORMAL HIGH (ref 65–99)
Potassium: 3.8 mmol/L (ref 3.5–5.1)
Sodium: 141 mmol/L (ref 135–145)

## 2016-12-20 LAB — CBC
HCT: 38.7 % — ABNORMAL LOW (ref 39.0–52.0)
Hemoglobin: 12.2 g/dL — ABNORMAL LOW (ref 13.0–17.0)
MCH: 25.6 pg — ABNORMAL LOW (ref 26.0–34.0)
MCHC: 31.5 g/dL (ref 30.0–36.0)
MCV: 81.1 fL (ref 78.0–100.0)
Platelets: 152 10*3/uL (ref 150–400)
RBC: 4.77 MIL/uL (ref 4.22–5.81)
RDW: 17.2 % — ABNORMAL HIGH (ref 11.5–15.5)
WBC: 5.7 10*3/uL (ref 4.0–10.5)

## 2016-12-20 LAB — I-STAT TROPONIN, ED: Troponin i, poc: 0.01 ng/mL (ref 0.00–0.08)

## 2016-12-20 LAB — BRAIN NATRIURETIC PEPTIDE: B Natriuretic Peptide: 735.6 pg/mL — ABNORMAL HIGH (ref 0.0–100.0)

## 2016-12-20 MED ORDER — ACETAMINOPHEN 325 MG PO TABS
650.0000 mg | ORAL_TABLET | ORAL | Status: DC | PRN
  Administered 2016-12-25 – 2016-12-26 (×4): 650 mg via ORAL
  Filled 2016-12-20 (×4): qty 2

## 2016-12-20 MED ORDER — MELATONIN 3 MG PO TABS
4.5000 mg | ORAL_TABLET | Freq: Every day | ORAL | Status: DC
  Administered 2016-12-20: 4.5 mg via ORAL
  Filled 2016-12-20 (×2): qty 1.5

## 2016-12-20 MED ORDER — FUROSEMIDE 10 MG/ML IJ SOLN
40.0000 mg | Freq: Once | INTRAMUSCULAR | Status: AC
  Administered 2016-12-20: 40 mg via INTRAVENOUS
  Filled 2016-12-20: qty 4

## 2016-12-20 MED ORDER — SODIUM CHLORIDE 0.9% FLUSH: 3.0000 mL | INTRAVENOUS | Status: DC | PRN

## 2016-12-20 MED ORDER — SODIUM CHLORIDE 0.9 % IV SOLN: 250.0000 mL | INTRAVENOUS | Status: DC | PRN

## 2016-12-20 MED ORDER — TAMSULOSIN HCL 0.4 MG PO CAPS
0.4000 mg | ORAL_CAPSULE | Freq: Every day | ORAL | Status: DC
  Administered 2016-12-21 – 2016-12-26 (×6): 0.4 mg via ORAL
  Filled 2016-12-20 (×6): qty 1

## 2016-12-20 MED ORDER — SODIUM CHLORIDE 0.9% FLUSH
3.0000 mL | Freq: Two times a day (BID) | INTRAVENOUS | Status: DC
  Administered 2016-12-20 – 2016-12-26 (×11): 3 mL via INTRAVENOUS

## 2016-12-20 MED ORDER — ATORVASTATIN CALCIUM 20 MG PO TABS
20.0000 mg | ORAL_TABLET | Freq: Every day | ORAL | Status: DC
Start: 2016-12-21 — End: 2016-12-26
  Administered 2016-12-21 – 2016-12-26 (×6): 20 mg via ORAL
  Filled 2016-12-20 (×6): qty 1

## 2016-12-20 MED ORDER — AMLODIPINE BESYLATE 5 MG PO TABS
10.0000 mg | ORAL_TABLET | Freq: Every day | ORAL | Status: DC
  Administered 2016-12-21: 10 mg via ORAL
  Filled 2016-12-20: qty 2

## 2016-12-20 MED ORDER — ONDANSETRON HCL 4 MG/2ML IJ SOLN: 4.0000 mg | Freq: Four times a day (QID) | INTRAMUSCULAR | Status: DC | PRN

## 2016-12-20 MED ORDER — ALLOPURINOL 300 MG PO TABS
300.0000 mg | ORAL_TABLET | Freq: Every day | ORAL | Status: DC
  Administered 2016-12-21 – 2016-12-26 (×6): 300 mg via ORAL
  Filled 2016-12-20 (×6): qty 1

## 2016-12-20 MED ORDER — LOSARTAN POTASSIUM 50 MG PO TABS
100.0000 mg | ORAL_TABLET | Freq: Every day | ORAL | Status: DC
  Administered 2016-12-21 – 2016-12-22 (×2): 100 mg via ORAL
  Filled 2016-12-20 (×2): qty 2

## 2016-12-20 MED ORDER — FENOFIBRATE 160 MG PO TABS
160.0000 mg | ORAL_TABLET | Freq: Every day | ORAL | Status: DC
  Administered 2016-12-21 – 2016-12-26 (×6): 160 mg via ORAL
  Filled 2016-12-20 (×6): qty 1

## 2016-12-20 MED ORDER — FUROSEMIDE 10 MG/ML IJ SOLN
40.0000 mg | Freq: Two times a day (BID) | INTRAMUSCULAR | Status: DC
  Administered 2016-12-21 – 2016-12-22 (×3): 40 mg via INTRAVENOUS
  Filled 2016-12-20 (×3): qty 4

## 2016-12-20 MED ORDER — PANTOPRAZOLE SODIUM 40 MG PO TBEC
40.0000 mg | DELAYED_RELEASE_TABLET | Freq: Every day | ORAL | Status: DC
  Administered 2016-12-21 – 2016-12-26 (×5): 40 mg via ORAL
  Filled 2016-12-20 (×6): qty 1

## 2016-12-20 MED ORDER — RIVAROXABAN 15 MG PO TABS
15.0000 mg | ORAL_TABLET | Freq: Every day | ORAL | Status: DC
  Administered 2016-12-21 – 2016-12-25 (×5): 15 mg via ORAL
  Filled 2016-12-20 (×5): qty 1

## 2016-12-20 NOTE — ED Notes (Signed)
Pt's SPO2 started at 98 before ambulating. While ambulating it fell to 91% and was consistently between 91 and 94%. Pt reports feeling short of breath while walking and that his legs were beginning to feel weak. Pt states he feel he is breathing harder while sitting down and it becomes harder to swallow and catch his breath.

## 2016-12-20 NOTE — H&P (Signed)
History and Physical    MELTON WALLS WLN:[id number] DOB: 1940-02-14 DOA: 12/20/2016  Referring MD/NP/PA: Providence Lanius, PA-C PCP: [name], MD  Patient coming from: home  Chief Complaint: Shortness of breath  I have personally briefly reviewed 59-29 old medical records in [place]   HPI: Paul Price is a 76 y.o. male with medical history significant of HTN, atrial fibrillation on Xarelto, DVT, CKD stage III; who presents with complaints of 4 weeks of progressively worsening shortness of breath.  Patient reports being unable to walk across his home without becoming extremely short of breath having to rest.  He has been unable to get much sleep at night as he is unable to lay flat.  Currently, reports sleeping in a chair at night.  Associated symptoms included leg swelling, intermittent burning with urination, intermittent dry cough, and reports of a fall 1 week ago.  He reports that over the several weeks he he has lost  Ablout 20 pounds of weight.  Patient is being followed by Dr. Claiborne Billings of cardiology. Review of records shows notes that his Toprolol XL was cut from 50 mg twice daily to 25 mg twice daily on 11/26 for bradycardia.  Then he was recommended on 12/4 to decrease dosage to Toprol XL 25 mg daily.  ED Course: Upon admission into the emergency department patient was seen to be afebrile, pulse 38-69, blood pressures 124/89 -167/69, and O2 saturations 96-100% currently on 3 L of nasal cannula oxygen.  Patient was never noted to be hypoxic but O2 saturations dropped as low as 89-90% with ambulation.  Labs revealed hemoglobin 12.2, BUN 29, creatinine 1.89, BNP 735.6, and troponin 0 0.01.  Chest x-ray showing unchanged cardiomegaly with vascular fullness.  TRH called to admit.  Review of Systems  Constitutional: Positive for malaise/fatigue. Negative for chills and fever.  HENT: Negative for ear discharge and nosebleeds.   Eyes: Negative for double vision and photophobia.    Respiratory: Positive for cough (Mild intermittent) and shortness of breath.   Cardiovascular: Positive for orthopnea and leg swelling.  Gastrointestinal: Negative for abdominal pain, diarrhea, nausea and vomiting.  Genitourinary: Positive for dysuria. Negative for frequency.  Musculoskeletal: Positive for falls (1 week ago). Negative for neck pain.  Skin: Negative for itching and rash.  Neurological: Negative for sensory change and loss of consciousness.  Endo/Heme/Allergies: Negative for polydipsia. Bruises/bleeds easily.  Psychiatric/Behavioral: Negative for hallucinations.    Past Medical History:  Diagnosis Date  . Anemia   . Arthritis    "shoulders" (09/07/2016)  . Atrial fibrillation ([place])   . BPH (benign prostatic hyperplasia)   . Chronic kidney disease (CKD), stage III (moderate) ([facility])   . Coronary artery disease   . DDD (degenerative disc disease), cervical   . DVT (deep venous thrombosis) ([place]) 12/2013   LLE  . GERD (gastroesophageal reflux disease)   . Gout   . High cholesterol   . History of blood transfusion 09/06/2016  . History of scarlet fever 1940s  . History of stress test 06/2011   No significant ischemia, this is a low risk scan. Clinical correlation recommended Abnormal myocardial perfusion study.  Marland Kitchen Hx of echocardiogram 05/2009   EF 40-45%, he did have mild annular calcification with mild-to-moderate MR and mild TR as well as aortic valve sclerosis. Estimated RV systolic pressure was 21 mm.  . Hypertension   . Kidney failure   . 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 Neck pain    "  not chronic" (09/07/2016)  . Obesity   . OSA on CPAP   . RBBB   . Renal insufficiency   . Spinal stenosis of lumbar region     Past Surgical History:  Procedure Laterality Date  . APPENDECTOMY    . CARDIAC CATHETERIZATION  2009   he was found to have a atretic LIMA graft to his LAD and had a patent vein graft supplying his diagonal vessel. At that time, he had  70% narrowing in his LAD beyond a diagonal vessel which was initially treated medically.  . COLONOSCOPY WITH PROPOFOL Left 09/09/2016   Procedure: COLONOSCOPY WITH PROPOFOL;  Surgeon: Arta Silence, MD;  Location: [facility];  Service: Endoscopy;  Laterality: Left;  . CORONARY ANGIOPLASTY WITH STENT PLACEMENT  April 2012   LAD DES  . CORONARY ARTERY BYPASS GRAFT  1992   with LIMA to the LAD and diagonal.  . ESOPHAGOGASTRODUODENOSCOPY (EGD) WITH PROPOFOL Left 09/08/2016   Procedure: ESOPHAGOGASTRODUODENOSCOPY (EGD) WITH PROPOFOL;  Surgeon: Ronnette Juniper, MD;  Location: [facility];  Service: Gastroenterology;  Laterality: Left;  . INGUINAL HERNIA REPAIR     "don't remeimber which side"  . TONSILLECTOMY       reports that he has quit smoking. His smoking use included cigarettes. He has a 30.00 pack-year smoking history. he has never used smokeless tobacco. He reports that he does not drink alcohol or use drugs.  Allergies  Allergen Reactions  . Nitrostat [Nitroglycerin] Other (See Comments)    Causes blood pressure to "bottom out"    Family History  Problem Relation Age of Onset  . Heart disease Father     Prior to Admission medications   Medication Sig Start Date End Date Taking? Authorizing Provider  allopurinol (ZYLOPRIM) 300 MG tablet Take 300 mg by mouth daily.    [provider]  amLODipine (NORVASC) 10 MG tablet Take 1 tablet (10 mg total) by mouth daily. 12/12/16   Barrett, Evelene Croon, PA-C  atorvastatin (LIPITOR) 20 MG tablet Take 1 tablet (20 mg total) by mouth daily. 09/27/16 12/26/16  Troy Sine, MD  esomeprazole (NEXIUM) 20 MG capsule Take 20 mg by mouth daily at 12 noon. Patient switches between protonix and nexium depending on what the pharmancy has in stock    [provider]  fenofibrate micronized (LOFIBRA) 134 MG capsule Take 134 mg by mouth daily before breakfast.    [provider]  losartan (COZAAR) 100 MG tablet Take 1 tablet by mouth  every morning. 11/27/16   [provider]  Melatonin 5 MG TABS Take 5 mg by mouth at bedtime.    [provider]  metoprolol succinate (TOPROL-XL) 25 MG 24 hr tablet Take 1 tablet (25 mg total) by mouth daily. Take with or immediately following a meal. 12/12/16   Barrett, Evelene Croon, PA-C  pantoprazole (PROTONIX) 40 MG tablet Take 1 tablet (40 mg total) by mouth daily. 09/09/16 09/09/17  Velvet Bathe, MD  Rivaroxaban (XARELTO) 15 MG TABS tablet Take 1 tablet (15 mg total) by mouth daily with supper. 09/10/16   Velvet Bathe, MD  Tamsulosin HCl (FLOMAX) 0.4 MG CAPS Take 1 capsule (0.4 mg total) by mouth daily. 07/20/11   Chauncy Passy, MD    Physical Exam:  Constitutional: Elderly male in NAD, calm, comfortable Vitals:   12/20/16 1721 12/20/16 1830 12/20/16 1845 12/20/16 1900  BP:  (!) 156/69 (!) 150/71 (!) 156/67  Pulse:  69 69 66  Resp: 18 19 18 20   Temp:  TempSrc:      SpO2:  100% 100% 97%   Eyes: PERRL, lids and conjunctivae normal ENMT: Mucous membranes are moist. Posterior pharynx clear of any exudate or lesions.  Neck: normal, supple, no masses, no thyromegaly Respiratory: clear to auscultation bilaterally, no wheezing, no crackles. Normal respiratory effort. No accessory muscle use.  Currently on 3 L nasal cannula oxygen Cardiovascular: irregular irregular, no murmurs / rubs / gallops.  1+ pitting lower extremity edema. 2+ pedal pulses. No carotid bruits.  Abdomen: no tenderness, no masses palpated. No hepatosplenomegaly. Bowel sounds positive.  Musculoskeletal: no clubbing / cyanosis. No joint deformity upper and lower extremities. Good ROM, no contractures. Normal muscle tone.  Skin: Abrasion noted to the left temple. Neurologic: CN 2-12 grossly intact. Sensation intact, DTR normal. Strength 5/5 in all 4.  Psychiatric: Normal judgment and insight. Alert and oriented x 3. Normal mood.     Labs on Admission: I have personally reviewed following labs and imaging  studies  CBC: Recent Labs  Lab 12/20/16 1731  WBC 5.7  HGB 12.2*  HCT 38.7*  MCV 81.1  PLT 284   Basic Metabolic Panel: Recent Labs  Lab 12/20/16 1731  NA 141  K 3.8  CL 112*  CO2 19*  GLUCOSE 102*  BUN 29*  CREATININE 1.89*  CALCIUM 8.9   GFR: CrCl cannot be calculated (Unknown ideal weight.). Liver Function Tests: No results for input(s): AST, ALT, ALKPHOS, BILITOT, PROT, ALBUMIN in the last 168 hours. No results for input(s): LIPASE, AMYLASE in the last 168 hours. No results for input(s): AMMONIA in the last 168 hours. Coagulation Profile: No results for input(s): INR, PROTIME in the last 168 hours. Cardiac Enzymes: No results for input(s): CKTOTAL, CKMB, CKMBINDEX, TROPONINI in the last 168 hours. BNP (last 3 results) No results for input(s): PROBNP in the last 8760 hours. HbA1C: No results for input(s): HGBA1C in the last 72 hours. CBG: No results for input(s): GLUCAP in the last 168 hours. Lipid Profile: No results for input(s): CHOL, HDL, LDLCALC, TRIG, CHOLHDL, LDLDIRECT in the last 72 hours. Thyroid Function Tests: No results for input(s): TSH, T4TOTAL, FREET4, T3FREE, THYROIDAB in the last 72 hours. Anemia Panel: No results for input(s): VITAMINB12, FOLATE, FERRITIN, TIBC, IRON, RETICCTPCT in the last 72 hours. Urine analysis:    Component Value Date/Time   COLORURINE STRAW (A) 09/06/2016 1632   APPEARANCEUR CLEAR 09/06/2016 1632   LABSPEC 1.009 09/06/2016 1632   PHURINE 5.0 09/06/2016 1632   GLUCOSEU NEGATIVE 09/06/2016 1632   HGBUR NEGATIVE 09/06/2016 1632   BILIRUBINUR NEGATIVE 09/06/2016 1632   KETONESUR NEGATIVE 09/06/2016 1632   PROTEINUR NEGATIVE 09/06/2016 1632   UROBILINOGEN 1.0 12/03/2013 0921   NITRITE NEGATIVE 09/06/2016 1632   LEUKOCYTESUR NEGATIVE 09/06/2016 1632   Sepsis Labs: No results found for this or any previous visit (from the past 240 hour(s)).   Radiological Exams on Admission: Dg Chest 2 View  Result Date:  12/20/2016 CLINICAL DATA:  Dyspnea on exertion for 4 weeks, significantly worsened today. EXAM: CHEST  2 VIEW COMPARISON:  08/08/2016 FINDINGS: Marked cardiomegaly, unchanged. Slight vascular prominence without frank interstitial edema. No airspace consolidation. No significant effusions. Prior sternotomy and CABG. IMPRESSION: Unchanged cardiomegaly. Worsened vascular fullness, without frank interstitial or alveolar edema. Electronically Signed   By: Andreas Newport M.D.   On: 12/20/2016 18:09    EKG: Independently reviewed.  Sinus bradycardia with second-degree heart block  Assessment/Plan Systolic heart failure exacerbation: Acute.  Patient presents with progressively worsening shortness of  breath over the last 4 weeks and symptoms of PND.  Reports never being told he had congestive heart failure previously in the past.  Chest x-ray showing cardiomegaly with increased vascular fullness.  BNP elevated at 735.6.  Last echocardiogram showing EF of 45-50% in 08/09/2016.  Patient appears to be followed by Dr. Claiborne Billings of cardiology. - Admit to a telemetry bed - Heart failure orders set  initiated  - Continuous pulse oximetry with nasal cannula oxygen as needed to keep O2 saturations >92% - Strict I&Os and daily weights - Elevate lower extremities - Lasix 40 mg IV Bid - Reassess in a.m. and adjust diuresis as needed. - Holding beta-blocker due to bradycardia  - Consultation to cardiology in a.m.   Symptomatic bradycardia: Patient seen to have heart rates into the 30s with 2:1 heart block.  Question if symptoms related to metoprolol.  - Hold metoprolol  Atrial fibrillation, history of DVT: Chadsvasc score = at least 5.  Also noted previous history of DVT. - Continue Xarelto  Normocytic anemia: Patient presents with a hemoglobin 12.2 with elevated RDW on admission. - TIBC and iron levels in a.m. - Repeat CBC in a.m.  CAD s/p CABG  Essential hypertension - Continue losartan and  amlodipine  Dysuria: Patient reports intermittent burning with urination, but urinalysis only positive for 6-30 WBCs. - Follow-up urine culture  Chronic kidney disease stage III: Patient presents with a creatinine of 1.89.  Baseline creatinine appears to be around 2.  - Recheck BMP in a.m.  Hyperlipidemia - Continue atorvastatin and fenofibrate  GERD - Continue Protonix  DVT prophylaxis: Xarelto Code Status: full Family Communication: No family present at bedside Disposition Plan: Likely discharge home in 2-3 days Consults called: None  Admission status: Inpatient  Norval Morton MD [facility] Pager [phone]   If 7PM-7AM, please contact night-coverage [email or web address] Password [facility]  12/20/2016, 8:33 PM

## 2016-12-20 NOTE — Telephone Encounter (Signed)
Blood pressure readings and heart rate are fairly stable.  Make certain he is using his CPAP therapy.  Consider checking BMET,  BNP and CBC.

## 2016-12-20 NOTE — ED Triage Notes (Signed)
Per [facility]: Pt to ED from home c/o SOB with exertion x 4 weeks, increasing today - pt states "I can't walk across the room without getting tired." SpO2 98% RA and on 3L O2 nasal cannula - pt reports decreased SOB with the nasal cannula O2. Pt has never been diagnosed with heart failure but does have a significant cardiac history (stents, bypass 20 years ago, A-Fib). HR 40-60 with EMS, 114/71. Pt denies pain, no lightheadedness or dizziness. L leg appears more swollen compared to the R lower leg. Pt denies abdominal distention, though abdomen appears round. Resp e/u at rest, skin warm/dry.

## 2016-12-20 NOTE — Telephone Encounter (Signed)
Paul Price is calling to give his blood pressure reading and heart rate. Stating that he is still very tired , unable to walk from one chair to another , having shortness of breath , no energy , and unable to sleep .  Have been working with medication for about 4 weeks and nothing has changed . Please call

## 2016-12-20 NOTE — Care Management (Signed)
CM consult reviewed while patient is in the ED. Patient is being admitted. Unit CM to follow for discharge needs. Venita Sheffield RN CCM

## 2016-12-20 NOTE — Telephone Encounter (Signed)
Returned call to pt he states that he is no better with his change of medications he is now taking amlodipine 5mg  and Toprol 25mg  daily. He states that he is still SOB and fatigued and unable to sleep at night and sometimes has to sleep in chair to get some sleep. Denies any cheat pain or pressure, nausea dizziness or any other sx. His BP is 12-4 138/61 HR58 Wt 200 12-5 117/62 50 200 12-6 128/72 55 199 12-8 125/60 45 199 12-10 122/58 44 202 12-11 133/63 50 201 12-12 138/69 50 202 O2 sat has been running ~96% Pt states that he does not think that he needs to go to the ER this has been happening for over 4weeks. He willo go if he needs to.

## 2016-12-20 NOTE — ED Provider Notes (Signed)
[facility] Provider Note   CSN: [id number] Arrival date & time: 12/20/16  1649     History   Chief Complaint Chief Complaint  Patient presents with  . Shortness of Breath    HPI Paul Price is a 76 y.o. male history of anemia, atrial fibrillation, chronic kidney disease, CAD, hypertension BIB EMS who presents for evaluation of 4 weeks of progressively worsening shortness of breath.  She reports over the last 4 weeks, he has noted progressively worse it is gotten so bad that he cannot walk across the room without getting tired.  States that even getting up around his house and doing things makes him severely short of breath and he has to sit down and rest.  He states that prior to onset of symptoms, he was able to tolerate normal walking and moderate exertional activity without any difficulties.  Patient also reports that he has not been able to sleep flat in the last several days.  He states that normally he is able to sleep on his back on his side but states that he has not been able to tolerate that secondary to shortness of breath.  He states that he has had to sleep with 2 pillows propping him up which is new for him.  He denies any diagnosis of heart failure.  Patient was evaluated by his cardiologist (Dr. Claiborne Billings) and had his metoprolol dosage cut in half.  Patient is currently on blood thinners.  Patient denies any fevers, chest pain, abdominal pain, nausea/vomiting, numbness/weakness of arms or legs.   The history is provided by the patient.    Past Medical History:  Diagnosis Date  . Anemia   . Arthritis    "shoulders" (09/07/2016)  . Atrial fibrillation ([place])   . BPH (benign prostatic hyperplasia)   . Chronic kidney disease (CKD), stage III (moderate) (HCC)   . Coronary artery disease   . DDD (degenerative disc disease), cervical   . DVT (deep venous thrombosis) ([place]) 12/2013   LLE  . GERD (gastroesophageal reflux disease)   . Gout   . High  cholesterol   . History of blood transfusion 09/06/2016  . History of scarlet fever 1940s  . History of stress test 06/2011   No significant ischemia, this is a low risk scan. Clinical correlation recommended Abnormal myocardial perfusion study.  Marland Kitchen Hx of echocardiogram 05/2009   EF 40-45%, he did have mild annular calcification with mild-to-moderate MR and mild TR as well as aortic valve sclerosis. Estimated RV systolic pressure was 21 mm.  . Hypertension   . Kidney failure   . 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 Neck pain    "not chronic" (09/07/2016)  . Obesity   . OSA on CPAP   . RBBB   . Renal insufficiency   . Spinal stenosis of lumbar region     Patient Active Problem List   Diagnosis Date Noted  . Systolic congestive heart failure ([place]) 12/20/2016  . Severe anemia 09/06/2016  . GIB (gastrointestinal bleeding) 09/06/2016  . Acute blood loss anemia   . New onset a-fib ([place]) 08/08/2016  . Chronic diastolic (congestive) heart failure ([place]) 08/08/2016  . Hypotension 08/08/2016  . A-fib ([place]) 08/08/2016  . Primary gout   . 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 Syncope 12/02/2013  . Left leg DVT ([place]) 12/02/2013  . Fever 12/02/2013  . AKI (acute kidney injury) ([place]) 12/02/2013  . Renal insufficiency 07/02/2013  . PAF (paroxysmal  atrial fibrillation) ([place]) 01/04/2013  . Sinus pause, 4.3 seconds.  Recorded by [facility]. 12/02/2012  . NSVT (nonsustained ventricular tachycardia) ([place]) 12/02/2012  . Dyspnea on exertion 11/20/2012  . 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 HTN (hypertension) 08/20/2012  . Hyperlipidemia with target LDL less than 70 08/20/2012  . Obesity (BMI 30-39.9) 08/20/2012  . OSA on CPAP 08/20/2012  . RBBB 08/20/2012    Past Surgical History:  Procedure Laterality Date  . APPENDECTOMY    . CARDIAC CATHETERIZATION  2009   he was found to have a atretic LIMA graft to his LAD and had a patent vein graft supplying his diagonal  vessel. At that time, he had 70% narrowing in his LAD beyond a diagonal vessel which was initially treated medically.  . COLONOSCOPY WITH PROPOFOL Left 09/09/2016   Procedure: COLONOSCOPY WITH PROPOFOL;  Surgeon: Arta Silence, MD;  Location: [facility];  Service: Endoscopy;  Laterality: Left;  . CORONARY ANGIOPLASTY WITH STENT PLACEMENT  April 2012   LAD DES  . CORONARY ARTERY BYPASS GRAFT  1992   with LIMA to the LAD and diagonal.  . ESOPHAGOGASTRODUODENOSCOPY (EGD) WITH PROPOFOL Left 09/08/2016   Procedure: ESOPHAGOGASTRODUODENOSCOPY (EGD) WITH PROPOFOL;  Surgeon: Ronnette Juniper, MD;  Location: [facility];  Service: Gastroenterology;  Laterality: Left;  . INGUINAL HERNIA REPAIR     "don't remeimber which side"  . TONSILLECTOMY         Home Medications    Prior to Admission medications   Medication Sig Start Date End Date Taking? Authorizing Provider  allopurinol (ZYLOPRIM) 300 MG tablet Take 300 mg by mouth daily.    [provider]  amLODipine (NORVASC) 10 MG tablet Take 1 tablet (10 mg total) by mouth daily. 12/12/16   Barrett, Evelene Croon, PA-C  atorvastatin (LIPITOR) 20 MG tablet Take 1 tablet (20 mg total) by mouth daily. 09/27/16 12/26/16  Troy Sine, MD  esomeprazole (NEXIUM) 20 MG capsule Take 20 mg by mouth daily at 12 noon. Patient switches between protonix and nexium depending on what the pharmancy has in stock    [provider]  fenofibrate micronized (LOFIBRA) 134 MG capsule Take 134 mg by mouth daily before breakfast.    [provider]  losartan (COZAAR) 100 MG tablet Take 1 tablet by mouth every morning. 11/27/16   [provider]  Melatonin 5 MG TABS Take 5 mg by mouth at bedtime.    [provider]  metoprolol succinate (TOPROL-XL) 25 MG 24 hr tablet Take 1 tablet (25 mg total) by mouth daily. Take with or immediately following a meal. 12/12/16   Barrett, Evelene Croon, PA-C  pantoprazole (PROTONIX) 40 MG tablet Take 1 tablet  (40 mg total) by mouth daily. 09/09/16 09/09/17  Velvet Bathe, MD  Rivaroxaban (XARELTO) 15 MG TABS tablet Take 1 tablet (15 mg total) by mouth daily with supper. 09/10/16   Velvet Bathe, MD  Tamsulosin HCl (FLOMAX) 0.4 MG CAPS Take 1 capsule (0.4 mg total) by mouth daily. 07/20/11   Chauncy Passy, MD    Family History Family History  Problem Relation Age of Onset  . Heart disease Father     Social History Social History   Tobacco Use  . Smoking status: Former Smoker    Packs/day: 1.00    Years: 30.00    Pack years: 30.00    Types: Cigarettes  . Smokeless tobacco: Never Used  . Tobacco comment: quit ~ 1985  Substance Use  Topics  . Alcohol use: No    Alcohol/week: 0.0 oz  . Drug use: No     Allergies   Nitrostat [nitroglycerin]   Review of Systems Review of Systems  Constitutional: Negative for fever.  Eyes: Negative for visual disturbance.  Respiratory: Positive for shortness of breath. Negative for cough.   Cardiovascular: Positive for leg swelling. Negative for chest pain.  Gastrointestinal: Negative for abdominal pain, diarrhea, nausea and vomiting.  Genitourinary: Negative for dysuria and hematuria.  Musculoskeletal: Negative for back pain and neck pain.  Neurological: Negative for dizziness, weakness, numbness and headaches.     Physical Exam Updated Vital Signs BP (!) 153/77 (BP Location: Right Arm)   Pulse 67   Temp 97.6 F (36.4 C) (Oral)   Resp 20   Ht 5\' 6"  (1.676 m)   Wt 92.8 kg (204 lb 8 oz)   SpO2 100%   BMI 33.01 kg/m   Physical Exam  Constitutional: He is oriented to person, place, and time. He appears well-developed and well-nourished.  HENT:  Head: Normocephalic and atraumatic.  Mouth/Throat: Oropharynx is clear and moist and mucous membranes are normal.  Eyes: Conjunctivae, EOM and lids are normal. Pupils are equal, round, and reactive to light.  Neck: Full passive range of motion without pain.  Cardiovascular: Normal rate, regular rhythm,  normal heart sounds and normal pulses. Exam reveals no gallop and no friction rub.  No murmur heard. Pulmonary/Chest: Effort normal. He has no decreased breath sounds. He has rales.  No evidence of respiratory distress. Able to speak in full sentences without difficulty. Diffuse crackles noted throughout lung fields   Abdominal: Soft. Normal appearance. There is no tenderness. There is no rigidity and no guarding.  Slightly protuberant abdomen  Musculoskeletal: Normal range of motion.  1+ pitting edema noted to BLE, left slightly larger than right. No overlying warmth, erythema.   Neurological: He is alert and oriented to person, place, and time.  Skin: Skin is warm and dry. Capillary refill takes less than 2 seconds.  Psychiatric: He has a normal mood and affect. His speech is normal.  Nursing note and vitals reviewed.    ED Treatments / Results  Labs (all labs ordered are listed, but only abnormal results are displayed) Labs Reviewed  BASIC METABOLIC PANEL - Abnormal; Notable for the following components:      Result Value   Chloride 112 (*)    CO2 19 (*)    Glucose, Bld 102 (*)    BUN 29 (*)    Creatinine, Ser 1.89 (*)    GFR calc non Af Amer 32 (*)    GFR calc Af Amer 38 (*)    All other components within normal limits  CBC - Abnormal; Notable for the following components:   Hemoglobin 12.2 (*)    HCT 38.7 (*)    MCH 25.6 (*)    RDW 17.2 (*)    All other components within normal limits  BRAIN NATRIURETIC PEPTIDE - Abnormal; Notable for the following components:   B Natriuretic Peptide 735.6 (*)    All other components within normal limits  URINALYSIS, ROUTINE W REFLEX MICROSCOPIC - Abnormal; Notable for the following components:   APPearance HAZY (*)    Hgb urine dipstick SMALL (*)    Protein, ur >=300 (*)    Squamous Epithelial / LPF 0-5 (*)    All other components within normal limits  BASIC METABOLIC PANEL  CBC WITH DIFFERENTIAL/PLATELET  TSH  I-STAT TROPONIN, ED  EKG  EKG Interpretation  Date/Time:  Wednesday December 20 2016 17:23:59 EST Ventricular Rate:  37 PR Interval:    QRS Duration: 138 QT Interval:  532 QTC Calculation: 418 R Axis:   -59 Text Interpretation:  Mobitz I 2-degree AV block (Wenckebach block) ? RBBB  Confirmed by Virgel Manifold [phone]) on 12/20/2016 5:41:32 PM       Radiology Dg Chest 2 View  Result Date: 12/20/2016 CLINICAL DATA:  Dyspnea on exertion for 4 weeks, significantly worsened today. EXAM: CHEST  2 VIEW COMPARISON:  08/08/2016 FINDINGS: Marked cardiomegaly, unchanged. Slight vascular prominence without frank interstitial edema. No airspace consolidation. No significant effusions. Prior sternotomy and CABG. IMPRESSION: Unchanged cardiomegaly. Worsened vascular fullness, without frank interstitial or alveolar edema. Electronically Signed   By: Andreas Newport M.D.   On: 12/20/2016 18:09    Procedures Procedures (including critical care time)  Medications Ordered in ED Medications  losartan (COZAAR) tablet 100 mg (not administered)  fenofibrate tablet 160 mg (not administered)  atorvastatin (LIPITOR) tablet 20 mg (not administered)  pantoprazole (PROTONIX) EC tablet 40 mg (not administered)  tamsulosin (FLOMAX) capsule 0.4 mg (not administered)  Rivaroxaban (XARELTO) tablet 15 mg (not administered)  Melatonin TABS 4.5 mg (not administered)  amLODipine (NORVASC) tablet 10 mg (not administered)  allopurinol (ZYLOPRIM) tablet 300 mg (not administered)  sodium chloride flush (NS) 0.9 % injection 3 mL (not administered)  sodium chloride flush (NS) 0.9 % injection 3 mL (not administered)  0.9 %  sodium chloride infusion (not administered)  acetaminophen (TYLENOL) tablet 650 mg (not administered)  ondansetron (ZOFRAN) injection 4 mg (not administered)  furosemide (LASIX) injection 40 mg (not administered)  furosemide (LASIX) injection 40 mg (40 mg Intravenous Given 12/20/16 2202)     Initial Impression  / Assessment and Plan / ED Course  I have reviewed the triage vital signs and the nursing notes.  Pertinent labs & imaging results that were available during my care of the patient were reviewed by me and considered in my medical decision making (see chart for details).      .76 y.o. M with PMH/o CKD, CAD, Paroxsymal Afib who presents for evaluation of 4 weeks of progressively worsening dyspnea on exertion.  Reports that over the last 4 weeks, he has noted unable to tolerate increased activity secondary to shortness of breath.  Patient reports that over the last few days it has become progressively worse.  He states that he cannot even walk across his house becoming short of breath and having to stop to rest.  Patient called EMS today.  On a initial EMS arrival, they stated that if patient was still his O2 sats were 9697% on room air.  When patient got up from the chair to moved to the stretcher his O2 sats dropped down to 88-90% on room air with significant tachypnea.  Patient was placed on 3 L nasal cannula with improvement in O2 sats. Patient is afebrile, non-toxic appearing, sitting comfortably on examination table. Vital signs reviewed and stable. O2 sats are 96% on RA at rest. No evidence of respiratory distress. Patient does have some pitting edema noted ot the BLE, with the left being slightly worse than the right but with no overlying warmth or erythema. Consider acute infectious etiology versus CHF versus ACS etiology.  Do not suspect PE given history/physical exam. Suspect this to be more likely to fluid overload. Patient has had an Echo but he states no diagnosis of CHF. No diuretics noted on meds.  Plan  to check basic labs, including chest x-ray, CBC, BMP, troponin, BNP, EKG.   EKG reviewed. Sinus brady, rate 39. Questionable 2nd degree AV block (mobitz vs Wenkebach).   Discussed patient with Dr. Conni Elliot regarding EKG findings. He has had bradycardia on previous EKGs. Concern if this is  symptomatic bradycardia or effect of beta blocker. At this time, does not feel he needs to have emergent pacing. If patient is still symptomatic and admitted, will plan to consult. Recommends stopping Metoprolol to evaluate for bradycardia, admission with Tele monitoring and serial troponins. If patient continues to be bradycardic despite stopping beta blocker, will get EP involved.   Echo on 08/09/16 shows LV EF 45-50%.  Review of records show the patient is not currently on any diuretics.  Labs and imaging reviewed.  Patient has evidence of cardiomegaly with worsening interstitial or alveolar edema. BNP 735.6, CBC with evidence of anemia. BMP shows elevated BUN/Cr but appears improved from most recent labs 4 months ago. Troponin negative.   Plan to ambulate patient in the department.   Patient walked and had drop in his O2 sats to 90%. He reported being SOB. Will discuss with hospitalist for admission.   Discussed patient with Dr. Tamala Julian (hospitalist). Will admit.   Final Clinical Impressions(s) / ED Diagnoses   Final diagnoses:  Dyspnea on exertion    ED Discharge Orders    None       Desma Mcgregor 12/20/16 2320    Virgel Manifold, MD 12/26/16 1109

## 2016-12-21 ENCOUNTER — Inpatient Hospital Stay (HOSPITAL_COMMUNITY): Payer: Medicare Other

## 2016-12-21 DIAGNOSIS — R0609 Other forms of dyspnea: Secondary | ICD-10-CM

## 2016-12-21 DIAGNOSIS — R001 Bradycardia, unspecified: Secondary | ICD-10-CM

## 2016-12-21 DIAGNOSIS — I5043 Acute on chronic combined systolic (congestive) and diastolic (congestive) heart failure: Secondary | ICD-10-CM

## 2016-12-21 DIAGNOSIS — I4891 Unspecified atrial fibrillation: Secondary | ICD-10-CM

## 2016-12-21 DIAGNOSIS — I361 Nonrheumatic tricuspid (valve) insufficiency: Secondary | ICD-10-CM

## 2016-12-21 DIAGNOSIS — I10 Essential (primary) hypertension: Secondary | ICD-10-CM

## 2016-12-21 LAB — BASIC METABOLIC PANEL
Anion gap: 10 (ref 5–15)
BUN: 25 mg/dL — ABNORMAL HIGH (ref 6–20)
CO2: 24 mmol/L (ref 22–32)
Calcium: 9.2 mg/dL (ref 8.9–10.3)
Chloride: 109 mmol/L (ref 101–111)
Creatinine, Ser: 1.75 mg/dL — ABNORMAL HIGH (ref 0.61–1.24)
GFR calc Af Amer: 41 mL/min — ABNORMAL LOW (ref >60)
GFR calc non Af Amer: 36 mL/min — ABNORMAL LOW (ref >60)
Glucose, Bld: 91 mg/dL (ref 65–99)
Potassium: 3.3 mmol/L — ABNORMAL LOW (ref 3.5–5.1)
Sodium: 143 mmol/L (ref 135–145)

## 2016-12-21 LAB — CBC WITH DIFFERENTIAL/PLATELET
Basophils Absolute: 0 10*3/uL (ref 0.0–0.1)
Basophils Relative: 0 %
Eosinophils Absolute: 0.2 10*3/uL (ref 0.0–0.7)
Eosinophils Relative: 4 %
HCT: 35.9 % — ABNORMAL LOW (ref 39.0–52.0)
Hemoglobin: 11.2 g/dL — ABNORMAL LOW (ref 13.0–17.0)
Lymphocytes Relative: 21 %
Lymphs Abs: 1.1 10*3/uL (ref 0.7–4.0)
MCH: 25.7 pg — ABNORMAL LOW (ref 26.0–34.0)
MCHC: 31.2 g/dL (ref 30.0–36.0)
MCV: 82.3 fL (ref 78.0–100.0)
Monocytes Absolute: 0.3 10*3/uL (ref 0.1–1.0)
Monocytes Relative: 7 %
Neutro Abs: 3.6 10*3/uL (ref 1.7–7.7)
Neutrophils Relative %: 68 %
Platelets: 141 10*3/uL — ABNORMAL LOW (ref 150–400)
RBC: 4.36 MIL/uL (ref 4.22–5.81)
RDW: 17.8 % — ABNORMAL HIGH (ref 11.5–15.5)
WBC: 5.2 10*3/uL (ref 4.0–10.5)

## 2016-12-21 LAB — IRON AND TIBC
Iron: 41 ug/dL — ABNORMAL LOW (ref 45–182)
Saturation Ratios: 9 % — ABNORMAL LOW (ref 17.9–39.5)
TIBC: 480 ug/dL — ABNORMAL HIGH (ref 250–450)
UIBC: 439 ug/dL

## 2016-12-21 LAB — FERRITIN: Ferritin: 45 ng/mL (ref 24–336)

## 2016-12-21 LAB — TSH: TSH: 3.605 u[IU]/mL (ref 0.350–4.500)

## 2016-12-21 MED ORDER — AMLODIPINE BESYLATE 5 MG PO TABS
5.0000 mg | ORAL_TABLET | Freq: Every day | ORAL | Status: DC
  Administered 2016-12-22 – 2016-12-25 (×4): 5 mg via ORAL
  Filled 2016-12-21 (×4): qty 1

## 2016-12-21 MED ORDER — ZOLPIDEM TARTRATE 5 MG PO TABS
5.0000 mg | ORAL_TABLET | Freq: Once | ORAL | Status: AC
  Administered 2016-12-21: 5 mg via ORAL
  Filled 2016-12-21: qty 1

## 2016-12-21 MED ORDER — POTASSIUM CHLORIDE CRYS ER 20 MEQ PO TBCR
40.0000 meq | EXTENDED_RELEASE_TABLET | Freq: Every day | ORAL | Status: DC
  Administered 2016-12-21 – 2016-12-26 (×6): 40 meq via ORAL
  Filled 2016-12-21 (×6): qty 2

## 2016-12-21 MED ORDER — PERFLUTREN LIPID MICROSPHERE
1.0000 mL | INTRAVENOUS | Status: AC | PRN
  Administered 2016-12-21: 2 mL via INTRAVENOUS
  Filled 2016-12-21: qty 10

## 2016-12-21 NOTE — Progress Notes (Signed)
Patient HR in 40-50's, at some point during the night went down to 38 bpm per CCMD. Patient asleep, asymptomatic, no complains.  Will continue to monitor.  Honestie Kulik, RN

## 2016-12-21 NOTE — Consult Note (Signed)
Cardiology Consultation:   Patient ID: Paul Price; [id number]; March 27, 1940   Admit date: 12/20/2016 Date of Consult: 12/21/2016  Primary Care Provider: [name], Price Primary Cardiologist: Dr. Claiborne Price   Patient Profile:   Paul Price is a 76 y.o. male with a hx of history of CABG 1992, stent to LAD 2012, OSA on CPAP, HTN, afib on Xarelto, DVT 2015, HLD, obesity, gout, RBBB, EF 45-50% by echo 08/2016, CKD stage III, GERD, GI bleed on anticoagulation who is being seen today for the evaluation of CHF at the request of Dr. Lonny Price.  History of Present Illness:   Paul Price presented to the [facility] ED for complaints of dyspnea. He was found to have a BNP of 735.6. He began to have Doe about 4-6 weeks ago. He has been seen in the office several times with med changes. This has progressed to DOE accompanied by lightheadedness with just walking across the room. He has weakness and fell about a week ago. He did not lose consciousness. He has orthopnea and PND, having to get up and go to a chair during the night. He has been sleeping very poorly. He weighs daily and has had no wt changes. He did not have any lower extremity edema until after starting amlodipine. He was told that this could occur and was watching for it so he knows that this is when it started. He has had no chest discomfort. His last dose of metoprolol was the day before yesterday. He is using CPAP consistently and does not smoke.   Paul Price was last seen in our office on 12/04/16 by Paul Price at which time he was noted to be bradycardic with heart rates registering in the 30's, EKG showed HR in the 50's. His BB was decreased by 50% to Toprol 25 mg. There was reluctance to stop it with his history of afib. He was having complaints of weakness and orthostatic type symptoms. He had no indications of fluid overload or dehydration. He was also started on amlodipine for elevated BP.  Pt feels mildly improved after 2 doses of lasix and  discontinuation of BB, but still has DOE with minimal exertion and lightheadedness with standing.   Past Medical History:  Diagnosis Date  . Anemia   . Arthritis    "shoulders" (09/07/2016)  . Atrial fibrillation ([place])   . BPH (benign prostatic hyperplasia)   . Chronic kidney disease (CKD), stage III (moderate) (HCC)   . Coronary artery disease   . DDD (degenerative disc disease), cervical   . DVT (deep venous thrombosis) ([place]) 12/2013   LLE  . GERD (gastroesophageal reflux disease)   . Gout   . High cholesterol   . History of blood transfusion 09/06/2016  . History of scarlet fever 1940s  . History of stress test 06/2011   No significant ischemia, this is a low risk scan. Clinical correlation recommended Abnormal myocardial perfusion study.  Marland Kitchen Hx of echocardiogram 05/2009   EF 40-45%, he did have mild annular calcification with mild-to-moderate Paul and mild TR as well as aortic valve sclerosis. Estimated RV systolic pressure was 21 mm.  . Hypertension   . Kidney failure   . 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 Neck pain    "not chronic" (09/07/2016)  . Obesity   . OSA on CPAP   . RBBB   . Renal insufficiency   . Spinal stenosis of lumbar region     Past Surgical History:  Procedure Laterality  Date  . APPENDECTOMY    . CARDIAC CATHETERIZATION  2009   he was found to have a atretic LIMA graft to his LAD and had a patent vein graft supplying his diagonal vessel. At that time, he had 70% narrowing in his LAD beyond a diagonal vessel which was initially treated medically.  . COLONOSCOPY WITH PROPOFOL Left 09/09/2016   Procedure: COLONOSCOPY WITH PROPOFOL;  Surgeon: Paul Silence, Price;  Location: [place];  Service: Endoscopy;  Laterality: Left;  . CORONARY ANGIOPLASTY WITH STENT PLACEMENT  April 2012   LAD DES  . CORONARY ARTERY BYPASS GRAFT  1992   with LIMA to the LAD and diagonal.  . ESOPHAGOGASTRODUODENOSCOPY (EGD) WITH PROPOFOL Left 09/08/2016   Procedure:  ESOPHAGOGASTRODUODENOSCOPY (EGD) WITH PROPOFOL;  Surgeon: Paul Juniper, Price;  Location: [place];  Service: Gastroenterology;  Laterality: Left;  . INGUINAL HERNIA REPAIR     "don't remeimber which side"  . TONSILLECTOMY       Home Medications:  Prior to Admission medications   Medication Sig Start Date End Date Taking? Authorizing Provider  acetaminophen (TYLENOL) 500 MG tablet Take 500 mg by mouth every 6 (six) hours as needed for moderate pain.   Yes Paul Price  allopurinol (ZYLOPRIM) 300 MG tablet Take 300 mg by mouth daily.   Yes Paul Price  amLODipine (NORVASC) 10 MG tablet Take 1 tablet (10 mg total) by mouth daily. 12/12/16  Yes Paul Price  atorvastatin (LIPITOR) 20 MG tablet Take 1 tablet (20 mg total) by mouth daily. 09/27/16 12/26/16 Yes Paul Sine, Price  esomeprazole (NEXIUM) 20 MG capsule Take 20 mg by mouth daily at 12 noon. Patient switches between protonix and nexium depending on what the pharmancy has in stock   Yes Paul Price  fenofibrate micronized (LOFIBRA) 134 MG capsule Take 134 mg by mouth daily before breakfast.   Yes Paul Price  furosemide (LASIX) 40 MG tablet Take 40 mg by mouth daily. 11/09/16  Yes Paul Price  losartan (COZAAR) 100 MG tablet Take 100 mg by mouth every morning.  11/27/16  Yes Paul Price  Melatonin 5 MG TABS Take 5 mg by mouth at bedtime as needed (sleep).    Yes Paul Price  metoprolol succinate (TOPROL-XL) 25 MG 24 hr tablet Take 1 tablet (25 mg total) by mouth daily. Take with or immediately following a meal. 12/12/16  Yes Paul Price  pantoprazole (PROTONIX) 40 MG tablet Take 1 tablet (40 mg total) by mouth daily. 09/09/16 09/09/17 Yes Paul Bathe, Price  Rivaroxaban (XARELTO) 15 MG TABS tablet Take 1 tablet (15 mg total) by mouth daily with supper. 09/10/16  Yes Paul Bathe, Price  Tamsulosin HCl (FLOMAX) 0.4 MG CAPS Take 1 capsule (0.4  mg total) by mouth daily. 07/20/11  Yes Paul Passy, Price    Inpatient Medications: Scheduled Meds: . allopurinol  300 mg Oral Daily  . amLODipine  10 mg Oral Daily  . atorvastatin  20 mg Oral Daily  . fenofibrate  160 mg Oral Daily  . furosemide  40 mg Intravenous BID  . losartan  100 mg Oral Daily  . Melatonin  4.5 mg Oral QHS  . pantoprazole  40 mg Oral Daily  . Rivaroxaban  15 mg Oral Q supper  . sodium chloride flush  3 mL Intravenous Q12H  . tamsulosin  0.4 mg Oral Daily   Continuous Infusions: . sodium chloride     PRN Meds: sodium  chloride, acetaminophen, ondansetron (ZOFRAN) IV, sodium chloride flush  Allergies:    Allergies  Allergen Reactions  . Nitrostat [Nitroglycerin] Other (See Comments)    Causes blood pressure to "bottom out"    Social History:   Social History   Socioeconomic History  . Marital status: Married    Spouse name: Not on file  . Number of children: Not on file  . Years of education: Not on file  . Highest education level: Not on file  Social Needs  . Financial resource strain: Not on file  . Food insecurity - worry: Not on file  . Food insecurity - inability: Not on file  . Transportation needs - medical: Not on file  . Transportation needs - non-medical: Not on file  Occupational History  . Not on file  Tobacco Use  . Smoking status: Former Smoker    Packs/day: 1.00    Years: 30.00    Pack years: 30.00    Types: Cigarettes  . Smokeless tobacco: Never Used  . Tobacco comment: quit ~ 1985  Substance and Sexual Activity  . Alcohol use: No    Alcohol/week: 0.0 oz  . Drug use: No  . Sexual activity: Not on file  Other Topics Concern  . Not on file  Social History Narrative  . Not on file    Family History:    Family History  Problem Relation Age of Onset  . Heart disease Father      ROS:  Please see the history of present illness.  ROS  All other ROS reviewed and negative.     Physical Exam/Data:   Vitals:    12/20/16 2243 12/21/16 0504 12/21/16 0933 12/21/16 1137  BP: (!) 153/77 (!) 157/54 (!) 150/71 (!) 129/49  Pulse: 67 68 (!) 49 (!) 57  Resp: 20 18 18 18   Temp: 97.6 F (36.4 C) 97.7 F (36.5 C) 98.4 F (36.9 C) 98 F (36.7 C)  TempSrc: Oral Oral Oral Oral  SpO2: 100% 97% 98% 98%  Weight: 204 lb 8 oz (92.8 kg) 201 lb 1.6 oz (91.2 kg)    Height: 5\' 6"  (1.676 m)       Intake/Output Summary (Last 24 hours) at 12/21/2016 1332 Last data filed at 12/21/2016 1100 Gross per 24 hour  Intake 600 ml  Output 2525 ml  Net -1925 ml   Filed Weights   12/20/16 2243 12/21/16 0504  Weight: 204 lb 8 oz (92.8 kg) 201 lb 1.6 oz (91.2 kg)   Body mass index is 32.46 kg/m.  General:  Well nourished, well developed, in no acute distress HEENT: normal Lymph: no adenopathy Neck: no JVD Endocrine:  No thryomegaly Vascular: No carotid bruits; FA pulses 2+ bilaterally without bruits  Cardiac:  normal S1, S2; RRR with occ early beat; no murmur  Lungs:  clear to auscultation bilaterally, no wheezing, rhonchi or rales  Abd: soft, nontender, no hepatomegaly  Ext: Trace-1+ pretibial edema Musculoskeletal:  No deformities, BUE and BLE strength normal and equal Skin: warm and dry  Neuro:  CNs 2-12 intact, no focal abnormalities noted Psych:  Normal affect   EKG:  The EKG was personally reviewed and demonstrates:  2nd degree AV block, type 1, 37 bpm, RBBB Telemetry:  Telemetry was personally reviewed and demonstrates:  2nd degree AV block, type 1 with occ PVC, rates 40's-50's  Relevant CV Studies:  Repeat echo pending  Echo: 08/09/2016 - Left ventricle: The cavity size was normal. Wall thickness was normal. Indeterminant diastolic function (  atrial fibrillation). Systolic function was mildly reduced. The estimated ejection fraction was in the range of 45% to 50%. Diffuse hypokinesis. - Aortic valve: There was no stenosis. - Mitral valve: Mildly calcified annulus. There was trivial  regurgitation. - Left atrium: The atrium was mildly dilated. - Right ventricle: The cavity size was mildly dilated. Systolic function was normal. - Right atrium: The atrium was mildly dilated. - Tricuspid valve: Peak RV-RA gradient (S): 20 mm Hg. - Pulmonary arteries: PA peak pressure: 23 mm Hg (S). - Inferior vena cava: The vessel was normal in size. The respirophasic diameter changes were in the normal range (>= 50%), consistent with normal central venous pressure. Impressions: - The patient was in atrial fibrillation. Normal LV size with EF 45-50%, mild diffuse hypokinesis. Mildly dilated RV with normal systolic function. No significant valvular abnormalities.  MYOVIEW: 05/04/2016 Study Highlights   The left ventricular ejection fraction is mildly decreased (45-54%).  Nuclear stress EF is calculated at 47% but appears to be at 55%.  There was no ST segment deviation noted during stress.  There is a small defect of mild severity present in the mid anterior and apical anterior location. 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 No ischemia noted.  This is a low risk study.    ANGIOGRAPHIC DATA: 1. Left main coronary artery was a long vessel which had mild 10% to less than 20% ostial smooth narrowing. The left main coronary artery bifurcated into an LAD and left circumflex system. 2. The LAD gave rise to a proximal diagonal vessel which had been bypassed with the previous vein graft. Just after this diagonal vessel, there was a moderate-sized septal perforating artery and following this was 75% to 80% area of narrowing in the LAD. The remainder of the LAD was free of significant disease. 3. The circumflex vessel was angiographically normal and gave rise to one major marginal vessel. 4. The right coronary artery was angiographically normal dominant vessel which supplied the PDA and inferior LV branch and  large posterolateral system. 5. The vein graft supplying the diagonal vessel was widely patent. 6. The LIMA graft which had supplied the LAD was atretic and did not reach the mid LAD. 7. The RAO ventriculography revealed preserved global contractility with ejection fraction of at least 55% with only a very minimal area of mild mid anterolateral hypocontractility. Following percutaneous coronary intervention to the native LAD with PTCA, stenting with a 3.0- x 20-mm DES Promus Element stent postdilated to 3.25 mm, the 75% to 80% stenosis was reduced to 0%. There was brisk TIMI 3 flow. There was no evidence for dissection. IMPRESSION: 1. Normal left ventricular function with a very minimal zone of mid anterolateral hypocontractility. 2. A 10% to 20% narrowing in the ostium of the left main with 75% stenosis in the mid left anterior descending after the bypassed diagonal vessel and septal perforating artery. 3. Normal circumflex system. 4. Normal dominant right coronary artery. 5. Patent vein graft supplying the diagonal vessel. 6. Atretic left internal mammary artery graft which had previously supplied the left anterior descending. 7. Successful percutaneous coronary intervention to the left anterior descending with the 75% to 80% stenosis being reduced to 0% with ultimate insertion of a 3.0- x 20-mm drug-eluting stent Promus Element stent postdilated to 3.25 mm. 8. Angiomax/60 mg oral Effient/IC nitroglycerin.   Laboratory Data:  Chemistry Recent Labs  Lab 12/20/16 1731 12/21/16 0652  NA 141 143  K 3.8 3.3*  CL 112* 109  CO2 19* 24  GLUCOSE 102* 91  BUN 29* 25*  CREATININE 1.89* 1.75*  CALCIUM 8.9 9.2  GFRNONAA 32* 36*  GFRAA 38* 41*  ANIONGAP 10 10    No results for input(s): PROT, ALBUMIN, AST, ALT, ALKPHOS, BILITOT in the last 168 hours. Hematology Recent Labs  Lab 12/20/16 1731 12/21/16 0652  WBC 5.7 5.2  RBC  4.77 4.36  HGB 12.2* 11.2*  HCT 38.7* 35.9*  MCV 81.1 82.3  MCH 25.6* 25.7*  MCHC 31.5 31.2  RDW 17.2* 17.8*  PLT 152 141*   Cardiac EnzymesNo results for input(s): TROPONINI in the last 168 hours.  Recent Labs  Lab 12/20/16 1753  TROPIPOC 0.01    BNP Recent Labs  Lab 12/20/16 1731  BNP 735.6*    DDimer No results for input(s): DDIMER in the last 168 hours.  Radiology/Studies:  Dg Chest 2 View  Result Date: 12/20/2016 CLINICAL DATA:  Dyspnea on exertion for 4 weeks, significantly worsened today. EXAM: CHEST  2 VIEW COMPARISON:  08/08/2016 FINDINGS: Marked cardiomegaly, unchanged. Slight vascular prominence without frank interstitial edema. No airspace consolidation. No significant effusions. Prior sternotomy and CABG. IMPRESSION: Unchanged cardiomegaly. Worsened vascular fullness, without frank interstitial or alveolar edema. Electronically Signed   By: Andreas Newport M.D.   On: 12/20/2016 18:09    Assessment and Plan:   1. Acute on chronic systolic heart failure -Pt admitted with dyspnea on minimal exertion. Elevated BNP 735.6 -EF 45-50% by echo 08/09/16 with diffuse hypokinesis and normal valves.  -Was on Toprol and ARB. No spironolactone due to renal dysfunction. Toprol currently on hold due to bradycardia. -CXR showed unchanged cardiomegaly, worsened vascular fullness, without frank interstitial or alveolar edema.  -Receiving 40 mg lasix IV BID, has received 2 doses. Home routine is 40 mg po daily. -Has had 2.3L UOP -Usual weight by last 3 office visits is 203 lbs. Admission wt 204 lbs, wt 201 lbs after diuresis. No change in home wts per pt.  -Pt does not appear to be significantly fluid overloaded.  -May be related to decreased cardiac output in setting of bradycardia -Repeat echo ordered to assess for worsened LV function -Continue diuresis. No sure if symptoms more related to bradycardia or fluid overload.  -Strict I&O, daily wts, low sodium diet.   2.  Bradycardia -EKG looks like 2nd degree AVB, type 1 with rate of 37 bpm.  -BB on hold. Last dose 48 hours ago. -Tele shows continued 2nd degree AVB type 1 with rates in the 40's-50's. Pt with continued symptoms.  -Possible sick sinus syndrome. Waiting for BB washout -Pt with recent symptoms of DOE with minimal exertion, lightheadedness and weakness with a fall last week. He may need an EP consult for consideration of pacemaker if symptoms persist with BB washout.   3. Paroxyasmal atrial fibrillation -Last episode of afib on EKG was 08/08/2016 -On admission EKG revealed bradycardia at 37 bpm, likely second degree AVB, type 1. BB recently reduced due to bradycardia.  -CHA2DS2/VAS Stroke Risk Points 8 >= 2 Points: High Risk  (CHF, Vac Dz, HTN, age (2), DM, stroke (2)). He is anticoagulated with Xarelto for stroke risk reduction, reduced dose of 15 mg as gfr <50.   4. CAD -Hx of remote CABG, 1992 and stent to LAD 2012 -Management with toprol (now stopped), statin, ARB. No aspirin as is anticoagulated.  -No recent chest pain. Negative troponin  5. Hypertension -Home meds include losartan 100 mg, recently added amlodipine 5 mg.  -BB on hold  due to bradycardia and amlodipine increased to 10 mg.  -BP fluctuating between normal and mildly elevated.   6. CKD stage III -Baseline creatinine around 2. On admission, SCr 1.89, today 1.75 -Off spironolactone due to renal dysfunction  7. Hypokalemia -K+ 3.8 on admission. K+ 3.3 today with diuresis. Replace potassium and monitor  8. Hx of DVT -On Xarelto  9. Hyperlipidemia -On atorvastatin 20 mg. And fenofibrate. Last LDL in Epic was 75. Will update lipid panel  10. OSA -On CPAP  For questions or updates, please contact [facility] Please consult [email or web address] for contact info under Cardiology/STEMI.   Signed, Daune Perch, NP  12/21/2016 1:32 PM   History and all data above reviewed.  Patient examined.  I agree with the findings as  above.  The patient has had lots of symptoms with weakness and fatigue as very prominent.  He has had decreased exercise tolerance which has been for about 6 weeks or so.  This is a clear change from a few months ago.  He has had HTN and bradycardia.  He was treated for atrial fib with rapid rate but now has bradycardia.  I have reviewed the EKGs and it looks like he has sinus with SA block and junctional escape at times.  He has leg edema but he says that this is new since admission.  The patient exam reveals [id number], no murmur  ,  Lungs: Decreased breath sounds at the bases.    ,  Abd: Positive bowel sounds, no rebound no guarding, Ext Mild edema.    .  All available labs, radiology testing, previous records reviewed. Agree with documented assessment and plan. Fatigue:  This could be related to his bradycardia.  Given the recent rapid atrial fib this would then be a tachy brady situation.  We need to completely stop the beta blocker.  Ambulate and watch his heart rate response.  He might need a pacemaker in which case I would suggest BiV given the reduced EF.  For now continue anticoagulation.  Repeat echo pending.  HTN:  This is difficult to manage.  I don't think that he feels well on the increased dose of Norvasc so we will reduce this.  He might need hydralazine/nitrtrates for further BP control.  Atrial fib:  As above.  Sleep apnea:  He apparently has adequate treatment and I will defer to Dr. Claiborne Price.  Acute on chronic systolic an diastolic HF:  Continue gentle diuresis following his creat closely.     Jeneen Rinks Chayil Gantt  4:11 PM  12/21/2016

## 2016-12-21 NOTE — Progress Notes (Signed)
Patient refused CPAP. RT will monitor throughout the night as needed.

## 2016-12-21 NOTE — Progress Notes (Signed)
PROGRESS NOTE    Paul Price  TMH:[id number] DOB: 13-Mar-1940 DOA: 12/20/2016 PCP: [name], MD   Brief Narrative: Paul Price is a 76 y.o. male with medical history significant of HTN, atrial fibrillation on Xarelto, DVT, CKD stage III. He presented with dyspnea with clinical concern for acute heart failure. BNP elevated to 735.6. Improvement with Lasix diuresis.     Assessment & Plan:   Principal Problem:   Systolic congestive heart failure (HCC) Active Problems:   CAD - CABG '92, LAD DES 4/12, low risk Myoview 6/13   HTN (hypertension)   Hyperlipidemia with target LDL less than 70   PAF (paroxysmal atrial fibrillation) (HCC)   A-fib (HCC)   Normocytic anemia   Symptomatic bradycardia   Acute heart failure with slightly reduced EF Last EF of 45-50% as seen on echo from 08/09/16. Clinically, patient sounds like he has a CHF exacerbation. Improvement in symptoms from yesterday with IV diuresis. UOP of 1.875L over the last 12 hours and weight of 1.6 kg from admission. Patient with history of atrial fibrillation which could have possibly contributed, but rate is controlled with episode of bradycardia. Not at baseline. -standing weights/strict in/out -continue IV lasix -echocardiogram -cardiology recommendations -daily BMP -PT eval  Acute respiratory failure with hypoxia Secondary to heart failure -wean O2 to room air  Atrial fibrillation Rate controlled. On Xarelto. Followed by Dr. Claiborne Billings as an outpatient.  History of DVT On Xarelto for atrial fibrillation.  First degree heart block Telemetry personally reviewed and significant for 1st degree heart block. Some pauses are longer without evidence of 2nd or 3rd degree block.  Normocytic anemia Above recent baseline of around 8. Stable currently. Iron panel without ferritin level. -ferritin add-on  Essential hypertension -Continue losartan and amlodipine. Will need to watch creatinine/potassium in setting of  diuresis and losartan  Dysuria Urine culture pending  CKD stage III Baseline creatinine of 2. At/below baseline.  Hyperlipidemia -Continue atorvastatin and fenofibrate  GERD -Continue Protonix   DVT prophylaxis: Xarelto Code Status: Full code Family Communication: None at bedside Disposition Plan: When medically stable, likely home.   Consultants:   Cardiology  Procedures:   Echocardiogram (pending)  Antimicrobials:  None    Subjective: Dyspnea improved. Patient with worsening dyspnea over the last month. Orthopnea. Sleeps in a chair now. No chest pain or palpitations.  Objective: Vitals:   12/20/16 2145 12/20/16 2200 12/20/16 2243 12/21/16 0504  BP: (!) 177/73 (!) 164/73 (!) 153/77 (!) 157/54  Pulse: (!) 46 65 67 68  Resp: 18 (!) 21 20 18   Temp:   97.6 F (36.4 C) 97.7 F (36.5 C)  TempSrc:   Oral Oral  SpO2: 97% 98% 100% 97%  Weight:   92.8 kg (204 lb 8 oz) 91.2 kg (201 lb 1.6 oz)  Height:   5\' 6"  (1.676 m)     Intake/Output Summary (Last 24 hours) at 12/21/2016 0819 Last data filed at 12/21/2016 0653 Gross per 24 hour  Intake 360 ml  Output 1875 ml  Net -1515 ml   Filed Weights   12/20/16 2243 12/21/16 0504  Weight: 92.8 kg (204 lb 8 oz) 91.2 kg (201 lb 1.6 oz)    Examination:  General exam: Appears calm and comfortable  Respiratory system: Mild basilar crackles. Respiratory effort normal. Cardiovascular system: S1 & S2 heard, RRR. No murmurs, rubs, gallops or clicks. Gastrointestinal system: Abdomen is nondistended, soft and nontender. No organomegaly or masses felt. Normal bowel sounds heard. Central nervous system: Alert  and oriented. No focal neurological deficits. Extremities: trace pitting edema. No calf tenderness Skin: No cyanosis. No rashes Psychiatry: Judgement and insight appear normal. Mood & affect appropriate.     Data Reviewed: I have personally reviewed following labs and imaging studies  CBC: Recent Labs  Lab  12/20/16 1731 12/21/16 0652  WBC 5.7 5.2  NEUTROABS  --  3.6  HGB 12.2* 11.2*  HCT 38.7* 35.9*  MCV 81.1 82.3  PLT 152 891*   Basic Metabolic Panel: Recent Labs  Lab 12/20/16 1731 12/21/16 0652  NA 141 143  K 3.8 3.3*  CL 112* 109  CO2 19* 24  GLUCOSE 102* 91  BUN 29* 25*  CREATININE 1.89* 1.75*  CALCIUM 8.9 9.2   GFR: Estimated Creatinine Clearance: 36.8 mL/min (A) (by C-G formula based on SCr of 1.75 mg/dL (H)). Liver Function Tests: No results for input(s): AST, ALT, ALKPHOS, BILITOT, PROT, ALBUMIN in the last 168 hours. No results for input(s): LIPASE, AMYLASE in the last 168 hours. No results for input(s): AMMONIA in the last 168 hours. Coagulation Profile: No results for input(s): INR, PROTIME in the last 168 hours. Cardiac Enzymes: No results for input(s): CKTOTAL, CKMB, CKMBINDEX, TROPONINI in the last 168 hours. BNP (last 3 results) No results for input(s): PROBNP in the last 8760 hours. HbA1C: No results for input(s): HGBA1C in the last 72 hours. CBG: No results for input(s): GLUCAP in the last 168 hours. Lipid Profile: No results for input(s): CHOL, HDL, LDLCALC, TRIG, CHOLHDL, LDLDIRECT in the last 72 hours. Thyroid Function Tests: Recent Labs    12/21/16 0652  TSH 3.605   Anemia Panel: Recent Labs    12/21/16 0652  TIBC 480*  IRON 41*   Sepsis Labs: No results for input(s): PROCALCITON, LATICACIDVEN in the last 168 hours.  No results found for this or any previous visit (from the past 240 hour(s)).       Radiology Studies: Dg Chest 2 View  Result Date: 12/20/2016 CLINICAL DATA:  Dyspnea on exertion for 4 weeks, significantly worsened today. EXAM: CHEST  2 VIEW COMPARISON:  08/08/2016 FINDINGS: Marked cardiomegaly, unchanged. Slight vascular prominence without frank interstitial edema. No airspace consolidation. No significant effusions. Prior sternotomy and CABG. IMPRESSION: Unchanged cardiomegaly. Worsened vascular fullness, without  frank interstitial or alveolar edema. Electronically Signed   By: Andreas Newport M.D.   On: 12/20/2016 18:09        Scheduled Meds: . allopurinol  300 mg Oral Daily  . amLODipine  10 mg Oral Daily  . atorvastatin  20 mg Oral Daily  . fenofibrate  160 mg Oral Daily  . furosemide  40 mg Intravenous BID  . losartan  100 mg Oral Daily  . Melatonin  4.5 mg Oral QHS  . pantoprazole  40 mg Oral Daily  . Rivaroxaban  15 mg Oral Q supper  . sodium chloride flush  3 mL Intravenous Q12H  . tamsulosin  0.4 mg Oral Daily   Continuous Infusions: . sodium chloride       LOS: 1 day     Cordelia Poche, MD [facility] 12/21/2016, 8:19 AM Pager: [phone]  If 7PM-7AM, please contact night-coverage [email or web address] Password [facility] 12/21/2016, 8:19 AM

## 2016-12-21 NOTE — Discharge Instructions (Addendum)
Supplemental Discharge Instructions for  Pacemaker/Defibrillator Patients  Activity No heavy lifting or vigorous activity with your left/right arm for 6 to 8 weeks.  Do not raise your left/right arm above your head for one week.  Gradually raise your affected arm as drawn below.             12/29/16                   12/30/16                  12/31/16                01/01/17 __  NO DRIVING until cleared to at your wound check appointment  WOUND CARE - Keep the wound area clean and dry.  Do not get this area wet for one week. No showers for 24 hours, you may shower on 12/26/16 evening. - The tape/steri-strips on your wound will fall off; do not pull them off.  No bandage is needed on the site.  DO  NOT apply any creams, oils, or ointments to the wound area. - If you notice any drainage or discharge from the wound, any swelling or bruising at the site, or you develop a fever > 101? F after you are discharged home, call the office at once.  Special Instructions - You are still able to use cellular telephones; use the ear opposite the side where you have your pacemaker/defibrillator.  Avoid carrying your cellular phone near your device. - When traveling through airports, show security personnel your identification card to avoid being screened in the metal detectors.  Ask the security personnel to use the hand wand. - Avoid arc welding equipment, MRI testing (magnetic resonance imaging), TENS units (transcutaneous nerve stimulators).  Call the office for questions about other devices. - Avoid electrical appliances that are in poor condition or are not properly grounded. - Microwave ovens are safe to be near or to operate.  Additional information for defibrillator patients should your device go off: - If your device goes off ONCE and you feel fine afterward, notify the device clinic nurses. - If your device goes off ONCE and you do not feel well afterward, call 911. - If your device goes  off TWICE, call 911. - If your device goes off THREE times in one day, call 911.  DO NOT DRIVE YOURSELF OR A FAMILY MEMBER WITH A DEFIBRILLATOR TO THE HOSPITAL--CALL 911.   Information on my medicine - XARELTO (Rivaroxaban)  This medication education was reviewed with me or my healthcare representative as part of my discharge preparation.    Why was Xarelto prescribed for you? Xarelto was prescribed for you to reduce the risk of a blood clot forming that can cause a stroke if you have a medical condition called atrial fibrillation (a type of irregular heartbeat).  What do you need to know about xarelto ? Take your Xarelto ONCE DAILY at the same time every day with your evening meal. If you have difficulty swallowing the tablet whole, you may crush it and mix in applesauce just prior to taking your dose.  Take Xarelto exactly as prescribed by your doctor and DO NOT stop taking Xarelto without talking to the doctor who prescribed the medication.  Stopping without other stroke prevention medication to take the place of Xarelto may increase your risk of developing a clot that causes a stroke.  Refill your prescription before you run out.  After discharge, you  should have regular check-up appointments with your healthcare provider that is prescribing your Xarelto.  In the future your dose may need to be changed if your kidney function or weight changes by a significant amount.  What do you do if you miss a dose? If you are taking Xarelto ONCE DAILY and you miss a dose, take it as soon as you remember on the same day then continue your regularly scheduled once daily regimen the next day. Do not take two doses of Xarelto at the same time or on the same day.   Important Safety Information A possible side effect of Xarelto is bleeding. You should call your healthcare provider right away if you experience any of the following: ? Bleeding from an injury or your nose that does not  stop. ? Unusual colored urine (red or dark brown) or unusual colored stools (red or black). ? Unusual bruising for unknown reasons. ? A serious fall or if you hit your head (even if there is no bleeding).  Some medicines may interact with Xarelto and might increase your risk of bleeding while on Xarelto. To help avoid this, consult your healthcare provider or pharmacist prior to using any new prescription or non-prescription medications, including herbals, vitamins, non-steroidal anti-inflammatory drugs (NSAIDs) and supplements.  This website has more information on Xarelto: [email or web address]-benson.[email or web address]

## 2016-12-21 NOTE — Progress Notes (Signed)
  Echocardiogram 2D Echocardiogram has been performed.  Paul Price 12/21/2016, 2:32 PM

## 2016-12-21 NOTE — Telephone Encounter (Signed)
Returned call s/w wife she states that pt has been using his CPAP every night and not skipping at all but she states that this does not help with pt's SOB and fatigue this has been happening for ~6 weeks. She states that pt is in the hospital 3E room 10 and she hopes that while in the hospital this time we can figure out why this is happening/.

## 2016-12-21 NOTE — Progress Notes (Signed)
Bath offered pt stated not right now

## 2016-12-21 NOTE — Progress Notes (Signed)
Patient requested something "stronger" for sleep. Paged MD on call [facility]. Ambien 5 mg, once verbal order given with read back.  Will continue to monitor.  Serrena Linderman, RN

## 2016-12-22 DIAGNOSIS — R55 Syncope and collapse: Secondary | ICD-10-CM

## 2016-12-22 DIAGNOSIS — I2510 Atherosclerotic heart disease of native coronary artery without angina pectoris: Secondary | ICD-10-CM

## 2016-12-22 DIAGNOSIS — E785 Hyperlipidemia, unspecified: Secondary | ICD-10-CM

## 2016-12-22 DIAGNOSIS — I441 Atrioventricular block, second degree: Secondary | ICD-10-CM

## 2016-12-22 DIAGNOSIS — I5023 Acute on chronic systolic (congestive) heart failure: Secondary | ICD-10-CM

## 2016-12-22 DIAGNOSIS — I495 Sick sinus syndrome: Secondary | ICD-10-CM

## 2016-12-22 DIAGNOSIS — I480 Paroxysmal atrial fibrillation: Secondary | ICD-10-CM

## 2016-12-22 LAB — BASIC METABOLIC PANEL
Anion gap: 10 (ref 5–15)
BUN: 24 mg/dL — ABNORMAL HIGH (ref 6–20)
CO2: 28 mmol/L (ref 22–32)
Calcium: 9.4 mg/dL (ref 8.9–10.3)
Chloride: 105 mmol/L (ref 101–111)
Creatinine, Ser: 2.06 mg/dL — ABNORMAL HIGH (ref 0.61–1.24)
GFR calc Af Amer: 34 mL/min — ABNORMAL LOW (ref >60)
GFR calc non Af Amer: 29 mL/min — ABNORMAL LOW (ref >60)
Glucose, Bld: 93 mg/dL (ref 65–99)
Potassium: 3.3 mmol/L — ABNORMAL LOW (ref 3.5–5.1)
Sodium: 143 mmol/L (ref 135–145)

## 2016-12-22 LAB — URINE CULTURE: Culture: NO GROWTH

## 2016-12-22 LAB — LIPID PANEL
Cholesterol: 104 mg/dL (ref 0–200)
HDL: 29 mg/dL — ABNORMAL LOW (ref >40)
LDL Cholesterol: 57 mg/dL (ref 0–99)
Total CHOL/HDL Ratio: 3.6 RATIO
Triglycerides: 91 mg/dL (ref <150)
VLDL: 18 mg/dL (ref 0–40)

## 2016-12-22 LAB — ECHOCARDIOGRAM COMPLETE
Height: 66 in
Weight: 3217.6 oz

## 2016-12-22 LAB — SURGICAL PCR SCREEN
MRSA, PCR: NEGATIVE
Staphylococcus aureus: NEGATIVE

## 2016-12-22 MED ORDER — SODIUM CHLORIDE 0.9 % IV SOLN
INTRAVENOUS | Status: DC
  Administered 2016-12-22: 50 mL/h via INTRAVENOUS

## 2016-12-22 MED ORDER — HYDRALAZINE HCL 10 MG PO TABS
10.0000 mg | ORAL_TABLET | Freq: Three times a day (TID) | ORAL | Status: DC
  Administered 2016-12-22 – 2016-12-26 (×12): 10 mg via ORAL
  Filled 2016-12-22 (×12): qty 1

## 2016-12-22 MED ORDER — CEFAZOLIN SODIUM-DEXTROSE 2-4 GM/100ML-% IV SOLN
2.0000 g | INTRAVENOUS | Status: AC
  Filled 2016-12-22: qty 100

## 2016-12-22 MED ORDER — FUROSEMIDE 40 MG PO TABS: 40.0000 mg | ORAL_TABLET | Freq: Every day | ORAL | Status: DC

## 2016-12-22 MED ORDER — SODIUM CHLORIDE 0.9 % IV SOLN: INTRAVENOUS | Status: DC

## 2016-12-22 MED ORDER — FUROSEMIDE 10 MG/ML IJ SOLN
40.0000 mg | Freq: Two times a day (BID) | INTRAMUSCULAR | Status: AC
Start: 2016-12-22 — End: 2016-12-23
  Administered 2016-12-22 – 2016-12-23 (×2): 40 mg via INTRAVENOUS
  Filled 2016-12-22 (×2): qty 4

## 2016-12-22 MED ORDER — SODIUM CHLORIDE 0.9 % IR SOLN
80.0000 mg | Status: AC
  Filled 2016-12-22: qty 2

## 2016-12-22 MED ORDER — MELATONIN 3 MG PO TABS
6.0000 mg | ORAL_TABLET | Freq: Every day | ORAL | Status: DC
  Administered 2016-12-22 – 2016-12-23 (×2): 6 mg via ORAL
  Filled 2016-12-22 (×4): qty 2

## 2016-12-22 NOTE — Progress Notes (Signed)
Paged cardiology to confirm surgery date and NPO orders.

## 2016-12-22 NOTE — Progress Notes (Signed)
Patient slept without complains or concerns on night shift.  Will continue to monitor.  Tiersa Dayley, RN

## 2016-12-22 NOTE — Care Management Note (Signed)
Case Management Note  Patient Details  Name: Paul Price MRN: [id number] Date of Birth: 1940-12-04  Subjective/Objective:                 Spoke with patient and wife at the bedside. They state they have all DME needed, no HH rec. They have tele health nurse through [facility]. They have no barriers accessing MD or medications. Three admissions in 6 months unrelated.  No CM needs identified at this time.    Action/Plan:   Expected Discharge Date:                  Expected Discharge Plan:  Home/Self Care  In-House Referral:     Discharge planning Services  CM Consult  Post Acute Care Choice:    Choice offered to:     DME Arranged:    DME Agency:     HH Arranged:    HH Agency:     Status of Service:  Completed, signed off  If discussed at [employer] of Stay Meetings, dates discussed:    Additional Comments:  Carles Collet, RN 12/22/2016, 2:48 PM

## 2016-12-22 NOTE — Consult Note (Signed)
ELECTROPHYSIOLOGY CONSULT NOTE  Patient ID: Paul Price, MRN: [id number], DOB/AGE: 1940/09/25 76 y.o. Admit date: 12/20/2016 Date of Consult: 12/22/2016  Primary Physician: [name], MD Primary Cardiologist:[name] is a 76 y.o. male who is being seen today for the evaluation of bradycardia and syncope at the request of Dr Claiborne Billings.   Chief Complaint: syncope and bradycardaia    HPI Paul Price is a 76 y.o. male  Admitted with progressive and persistent weakness over the last 6 weeks accompanied by dyspnea on exertion nocturnal dyspnea peripheral edema.  He has been noted to be bradycardic both in office as well as at home with repeated recordings of rates in the 30s associated with profound weakness and lightheadedness.  There has been no associated chest pain.  No changes in medications except decreasing in the discontinuation of his beta blockers because of heart rates in the 30s and 40s.  He has a history of ischemic heart disease with remote bypass surgery LAD intervention subsequently.  He has a history of recurrent syncope.  These episodes are associated with a stereo typical prodrome always occurred while standing.  On one occasion he was found to have a hemoglobin of 5 is related to GI bleeding and requiring transfusions.  He has persisted with orthostatic lightheadedness with similar prodrome.  He has a history of atrial fibrillation and a rapid rate.  Looking back in her heart tracings from 2017 the fastest atrial fibrillation rate ICD is in the 80s although there were bursts in the 120s.  He is on anticoagulation.  He has renal insufficiency which has hovered between grade 3 and grade 4.  He continues to have nocturnal dyspnea and did so last night despite telemetry demonstrating near normal heart rates most of the time  He has obstructive sleep apnea is on CPAP.  He has long-standing right bundle branch block with left anterior fascicular  block  Thromboembolic risk factors ( age  -2, HTN-1, TIA/CVA-2, Vasc disease -1, CHF-1) for a CHADSVASc Score of 7     Past Medical History:  Diagnosis Date  . Anemia   . Arthritis    "shoulders" (09/07/2016)  . Atrial fibrillation ([place])   . BPH (benign prostatic hyperplasia)   . Chronic kidney disease (CKD), stage III (moderate) (HCC)   . Coronary artery disease   . DDD (degenerative disc disease), cervical   . DVT (deep venous thrombosis) ([place]) 12/2013   LLE  . GERD (gastroesophageal reflux disease)   . Gout   . High cholesterol   . History of blood transfusion 09/06/2016  . History of scarlet fever 1940s  . History of stress test 06/2011   No significant ischemia, this is a low risk scan. Clinical correlation recommended Abnormal myocardial perfusion study.  Marland Kitchen Hx of echocardiogram 05/2009   EF 40-45%, he did have mild annular calcification with mild-to-moderate MR and mild TR as well as aortic valve sclerosis. Estimated RV systolic pressure was 21 mm.  . Hypertension   . Kidney failure   . 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 Neck pain    "not chronic" (09/07/2016)  . Obesity   . OSA on CPAP   . RBBB   . Renal insufficiency   . Spinal stenosis of lumbar region       Surgical History:  Past Surgical History:  Procedure Laterality Date  . APPENDECTOMY    . CARDIAC CATHETERIZATION  2009   he was found to have  a atretic LIMA graft to his LAD and had a patent vein graft supplying his diagonal vessel. At that time, he had 70% narrowing in his LAD beyond a diagonal vessel which was initially treated medically.  . COLONOSCOPY WITH PROPOFOL Left 09/09/2016   Procedure: COLONOSCOPY WITH PROPOFOL;  Surgeon: Arta Silence, MD;  Location: [facility];  Service: Endoscopy;  Laterality: Left;  . CORONARY ANGIOPLASTY WITH STENT PLACEMENT  April 2012   LAD DES  . CORONARY ARTERY BYPASS GRAFT  1992   with LIMA to the LAD and diagonal.  . ESOPHAGOGASTRODUODENOSCOPY (EGD)  WITH PROPOFOL Left 09/08/2016   Procedure: ESOPHAGOGASTRODUODENOSCOPY (EGD) WITH PROPOFOL;  Surgeon: Ronnette Juniper, MD;  Location: [facility];  Service: Gastroenterology;  Laterality: Left;  . INGUINAL HERNIA REPAIR     "don't remeimber which side"  . TONSILLECTOMY       Home Meds: Prior to Admission medications   Medication Sig Start Date End Date Taking? Authorizing Provider  acetaminophen (TYLENOL) 500 MG tablet Take 500 mg by mouth every 6 (six) hours as needed for moderate pain.   Yes [provider]  allopurinol (ZYLOPRIM) 300 MG tablet Take 300 mg by mouth daily.   Yes [provider]  amLODipine (NORVASC) 10 MG tablet Take 1 tablet (10 mg total) by mouth daily. 12/12/16  Yes Barrett, Evelene Croon, PA-C  atorvastatin (LIPITOR) 20 MG tablet Take 1 tablet (20 mg total) by mouth daily. 09/27/16 12/26/16 Yes Troy Sine, MD  esomeprazole (NEXIUM) 20 MG capsule Take 20 mg by mouth daily at 12 noon. Patient switches between protonix and nexium depending on what the pharmancy has in stock   Yes [provider]  fenofibrate micronized (LOFIBRA) 134 MG capsule Take 134 mg by mouth daily before breakfast.   Yes [provider]  furosemide (LASIX) 40 MG tablet Take 40 mg by mouth daily. 11/09/16  Yes [provider]  losartan (COZAAR) 100 MG tablet Take 100 mg by mouth every morning.  11/27/16  Yes [provider]  Melatonin 5 MG TABS Take 5 mg by mouth at bedtime as needed (sleep).    Yes [provider]  metoprolol succinate (TOPROL-XL) 25 MG 24 hr tablet Take 1 tablet (25 mg total) by mouth daily. Take with or immediately following a meal. 12/12/16  Yes Barrett, Evelene Croon, PA-C  pantoprazole (PROTONIX) 40 MG tablet Take 1 tablet (40 mg total) by mouth daily. 09/09/16 09/09/17 Yes Velvet Bathe, MD  Rivaroxaban (XARELTO) 15 MG TABS tablet Take 1 tablet (15 mg total) by mouth daily with supper. 09/10/16  Yes Velvet Bathe, MD  Tamsulosin HCl  (FLOMAX) 0.4 MG CAPS Take 1 capsule (0.4 mg total) by mouth daily. 07/20/11  Yes Chauncy Passy, MD    Inpatient Medications:  . allopurinol  300 mg Oral Daily  . amLODipine  5 mg Oral Daily  . atorvastatin  20 mg Oral Daily  . fenofibrate  160 mg Oral Daily  . [START ON 12/23/2016] furosemide  40 mg Oral Daily  . hydrALAZINE  10 mg Oral Q8H  . Melatonin  6 mg Oral QHS  . pantoprazole  40 mg Oral Daily  . potassium chloride  40 mEq Oral Daily  . Rivaroxaban  15 mg Oral Q supper  . sodium chloride flush  3 mL Intravenous Q12H  . tamsulosin  0.4 mg Oral Daily      Allergies:  Allergies  Allergen Reactions  . Nitrostat [Nitroglycerin] Other (See Comments)    Causes blood  pressure to "bottom out"    Social History   Socioeconomic History  . Marital status: Married    Spouse name: Not on file  . Number of children: Not on file  . Years of education: Not on file  . Highest education level: Not on file  Social Needs  . Financial resource strain: Not on file  . Food insecurity - worry: Not on file  . Food insecurity - inability: Not on file  . Transportation needs - medical: Not on file  . Transportation needs - non-medical: Not on file  Occupational History  . Not on file  Tobacco Use  . Smoking status: Former Smoker    Packs/day: 1.00    Years: 30.00    Pack years: 30.00    Types: Cigarettes  . Smokeless tobacco: Never Used  . Tobacco comment: quit ~ 1985  Substance and Sexual Activity  . Alcohol use: No    Alcohol/week: 0.0 oz  . Drug use: No  . Sexual activity: Not on file  Other Topics Concern  . Not on file  Social History Narrative  . Not on file     Family History  Problem Relation Age of Onset  . Heart disease Father      ROS:  Please see the history of present illness.     All other systems reviewed and negative.    Physical Exam: Blood pressure (!) 151/63, pulse 80, temperature 97.7 F (36.5 C), temperature source Oral, resp. rate 18, height 5\' 6"   (1.676 m), weight 192 lb 7.4 oz (87.3 kg), SpO2 96 %. General: Well developed, well nourished male in no acute distress. Head: Normocephalic, atraumatic, sclera non-icteric, no xanthomas, nares are without discharge. EENT: normal Lymph Nodes:  none Back: without scoliosis/kyphosis, no CVA tendersness Neck: Negative for carotid bruits. JVD 10 Lungs: Clear bilaterally to auscultation without wheezes, rales, or rhonchi. Breathing is unlabored. Heart: Irregular rate and rhythm with a 2/6 systolic murmur , rubs, or gallops appreciated. Abdomen: Soft, non-tender, non-distended with normoactive bowel sounds. No hepatomegaly. No rebound/guarding. No obvious abdominal masses. Msk:  Strength and tone appear normal for age. Extremities: No clubbing or cyanosis.  2+ edema.  Distal pedal pulses are 2+ and equal bilaterally. Skin: Warm and Dry Neuro: Alert and oriented X 3. CN III-XII intact Grossly normal sensory and motor function . Psych:  Responds to questions appropriately with a normal affect.      Labs: Cardiac Enzymes No results for input(s): CKTOTAL, CKMB, TROPONINI in the last 72 hours. CBC Lab Results  Component Value Date   WBC 5.2 12/21/2016   HGB 11.2 (L) 12/21/2016   HCT 35.9 (L) 12/21/2016   MCV 82.3 12/21/2016   PLT 141 (L) 12/21/2016   PROTIME: No results for input(s): LABPROT, INR in the last 72 hours. Chemistry  Recent Labs  Lab 12/22/16 0535  NA 143  K 3.3*  CL 105  CO2 28  BUN 24*  CREATININE 2.06*  CALCIUM 9.4  GLUCOSE 93   Lipids Lab Results  Component Value Date   CHOL 104 12/22/2016   HDL 29 (L) 12/22/2016   LDLCALC 57 12/22/2016   TRIG 91 12/22/2016   BNP No results found for: PROBNP Thyroid Function Tests: Recent Labs    12/21/16 0652  TSH 3.605      Miscellaneous No results found for: DDIMER  Radiology/Studies:  Dg Chest 2 View  Result Date: 12/20/2016 CLINICAL DATA:  Dyspnea on exertion for 4 weeks, significantly worsened today.  EXAM: CHEST  2 VIEW COMPARISON:  08/08/2016 FINDINGS: Marked cardiomegaly, unchanged. Slight vascular prominence without frank interstitial edema. No airspace consolidation. No significant effusions. Prior sternotomy and CABG. IMPRESSION: Unchanged cardiomegaly. Worsened vascular fullness, without frank interstitial or alveolar edema. Electronically Signed   By: Andreas Newport M.D.   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  This has persisted despite discontinuation of beta-blockers.  Hence, pacemaker is indicated.  Patient has mild right bundle branch block; there is no evidence of AV conduction block, but will try to place a His bundle lead  I do not understand correlation between onset of his symptoms of bradycardia and his heart failure manifested by orthopnea nocturnal dyspnea and peripheral edema.  With an elevated JVP, I do not think the amlodipine is responsible for his edema.  We will attempt diuresis will have to pay close attention to renal function.  His most recent apogee was 2.9 or so  08/08/16  Mechanism of his syncope is also not clear.  He has orthostasis.  This too occurs in the same 6-week timeframe.  The syncopal episodes have had a stereo typical prodrome suggesting that they may be neurally mediated.  He does not recall any new medications in this timeframe.  Syncope in the context of bifascicular block is also an indication for pacing  He has left ventricular hypertrophy.  He has long-standing hypertension.  QRS amplitude is not small, but the constellation of conduction system disease and heart  failure in this age group raises the possibility of amyloid.  We will check a UPEP and an SPEP and in this context, technetium pyrophosphate scanning can be very helpful in making a definitive diagnosis based on recent published work.  I have discussed the above with both Drs. Claiborne Billings [employer] as we try to understand why it is that he has syncope, conduction system disease and heart failure all relatively acutely.      Virl Axe

## 2016-12-22 NOTE — Progress Notes (Signed)
PT acknowledged he had a CPAP order but is concerned about wearing it tonight because he will be up moving a lot while doing a 24 hour urine collection.  RT told patient to let RT know if he changes his mind and would like to wear it.

## 2016-12-22 NOTE — Consult Note (Signed)
   [facility] CM Inpatient Consult   12/22/2016  ZALMAN HULL 11-09-1940 [id number]  Made aware of patient with multiple admissions. Came by to speak with the patient.  Cardiologist assessing.  Spoke with inpatient RNCM and states no needs. EMMI general calls for follow up. For questions, please contact:  Natividad Brood, RN BSN [facility] Liaison  [phone] business mobile phone Toll free office [phone]

## 2016-12-22 NOTE — Evaluation (Signed)
Physical Therapy Evaluation Patient Details Name: Paul Price MRN: [id number] DOB: Aug 15, 1940 Today's Date: 12/22/2016   History of Present Illness  Pt is a 76 y.o. male admitted 12/20/16 with c/o progressively worsening SOB for 4 weeks; worked up for CHF. Pertinent PMH includes CKD, CAD, a-fib, DVT, gout, HTN, OSA on CPAP.     Clinical Impression  Patient evaluated by Physical Therapy with no further acute PT needs identified. PTA, lives with wife and indep with mobility; endorses worsening SOB which has limited his ambulation distance. Discussed potential for Cardiac Rehab phase II for improved cardiovascular endurance and strength. Today, pt indep for amb and steps with rail. HR 49-50 during ambulation, up to 67-68 upon return to room. Educated on activity pacing, fall risk reduction, and overall safety with symptoms at home. All education has been completed and the patient has no further questions. PT is signing off. Thank you for this referral.    Follow Up Recommendations No PT follow up    Equipment Recommendations  None recommended by PT    Recommendations for Other Services       Precautions / Restrictions Precautions Precautions: None Restrictions Weight Bearing Restrictions: No      Mobility  Bed Mobility Overal bed mobility: Independent                Transfers Overall transfer level: Independent Equipment used: None                Ambulation/Gait Ambulation/Gait assistance: Independent Ambulation Distance (Feet): 400 Feet Assistive device: None Gait Pattern/deviations: Step-through pattern;Decreased stride length Gait velocity: Decreased   General Gait Details: Indep for amb with no AD; occasional use of RUE support on rail. Encouraged to use [facility] for added stability if needed. HR 49-50 during standing rest break, 67-68 post-amb  Stairs Stairs: Yes Stairs assistance: Modified independent (Device/Increase time) Stair Management: One rail  Right;Forwards Number of Stairs: 8 General stair comments: Ascend/descended 8 steps mod indep with use of single rail  Wheelchair Mobility    Modified Rankin (Stroke Patients Only)       Balance Overall balance assessment: Needs assistance   Sitting balance-[name] Scale: Good       Standing balance-[name] Scale: Good                               Pertinent Vitals/Pain Pain Assessment: Faces Faces Pain Scale: No hurt    Home Living Family/patient expects to be discharged to:: Private residence Living Arrangements: Spouse/significant other Available Help at Discharge: Family;Available 24 hours/day Type of Home: House Home Access: Stairs to enter Entrance Stairs-Rails: Right Entrance Stairs-Number of Steps: 5 Home Layout: One level Home Equipment: Walker - 2 wheels;Cane - single point      Prior Function Level of Independence: Independent         Comments: Intermittent use of SPC or RW for balance, otherwise indep with mobility. Limited by increased SOB accompanied by lightheadedness the past few weeks     Hand Dominance        Extremity/Trunk Assessment   Upper Extremity Assessment Upper Extremity Assessment: Overall WFL for tasks assessed    Lower Extremity Assessment Lower Extremity Assessment: Overall WFL for tasks assessed       Communication   Communication: HOH  Cognition Arousal/Alertness: Awake/alert Behavior During Therapy: WFL for tasks assessed/performed Overall Cognitive Status: Within Functional Limits for tasks assessed  General Comments General comments (skin integrity, edema, etc.): Son present during session    Exercises     Assessment/Plan    PT Assessment Patent does not need any further PT services  PT Problem List         PT Treatment Interventions      PT Goals (Current goals can be found in the Care Plan section)  Acute Rehab PT Goals Patient  Stated Goal: Figured out heart problems PT Goal Formulation: With patient Time For Goal Achievement: 01/05/17 Potential to Achieve Goals: Good    Frequency     Barriers to discharge        Co-evaluation               AM-PAC PT "6 Clicks" Daily Activity  Outcome Measure Difficulty turning over in bed (including adjusting bedclothes, sheets and blankets)?: None Difficulty moving from lying on back to sitting on the side of the bed? : None Difficulty sitting down on and standing up from a chair with arms (e.g., wheelchair, bedside commode, etc,.)?: None Help needed moving to and from a bed to chair (including a wheelchair)?: None Help needed walking in hospital room?: None Help needed climbing 3-5 steps with a railing? : None 6 Click Score: 24    End of Session Equipment Utilized During Treatment: Gait belt Activity Tolerance: Patient tolerated treatment well Patient left: in bed;with call bell/phone within reach;with family/visitor present;with nursing/sitter in room Nurse Communication: Mobility status PT Visit Diagnosis: Other abnormalities of gait and mobility (R26.89)    Time: 6004-5997 PT Time Calculation (min) (ACUTE ONLY): 18 min   Charges:   PT Evaluation $PT Eval Moderate Complexity: 1 Mod     PT G Codes:       Mabeline Caras, PT, DPT Acute Rehab Services  Pager: [place] 12/22/2016, 10:13 AM

## 2016-12-22 NOTE — Progress Notes (Signed)
Progress Note  [name] Date of Encounter: 12/22/2016  Primary Cardiologist: Claiborne Billings  Subjective   Fatigue, weakness persists;  Dyspnea improved  Inpatient Medications    Scheduled Meds: . allopurinol  300 mg Oral Daily  . amLODipine  5 mg Oral Daily  . atorvastatin  20 mg Oral Daily  . fenofibrate  160 mg Oral Daily  . [START ON 12/23/2016] furosemide  40 mg Oral Daily  . losartan  100 mg Oral Daily  . Melatonin  6 mg Oral QHS  . pantoprazole  40 mg Oral Daily  . potassium chloride  40 mEq Oral Daily  . Rivaroxaban  15 mg Oral Q supper  . sodium chloride flush  3 mL Intravenous Q12H  . tamsulosin  0.4 mg Oral Daily   Continuous Infusions: . sodium chloride     PRN Meds: sodium chloride, acetaminophen, ondansetron (ZOFRAN) IV, sodium chloride flush   Vital Signs    Vitals:   12/21/16 2348 12/22/16 0443 12/22/16 0847 12/22/16 1130  BP: (!) 161/72 (!) 164/86 (!) 150/66 (!) 151/63  Pulse: 81 80 70 80  Resp: 18 18  18   Temp: 97.8 F (36.6 C) 97.7 F (36.5 C)  97.7 F (36.5 C)  TempSrc: Oral Oral  Oral  SpO2: 96% 94%  96%  Weight:  192 lb 7.4 oz (87.3 kg)    Height:        Intake/Output Summary (Last 24 hours) at 12/22/2016 1406 Last data filed at 12/22/2016 1014 Gross per 24 hour  Intake 720 ml  Output 3100 ml  Net -2380 ml    I/O since admission: -4305   Filed Weights   12/20/16 2243 12/21/16 0504 12/22/16 0443  Weight: 204 lb 8 oz (92.8 kg) 201 lb 1.6 oz (91.2 kg) 192 lb 7.4 oz (87.3 kg)    Telemetry    Mobitz Type 1 second degree block rate 40 - 60 today - Personally Reviewed  ECG    ECG (independently read by me): will obtain today  12/12/208 ECG (independently read by me): Mobitz Type 1 second degree block  Physical Exam   BP (!) 151/63 (BP Location: Right Arm)   Pulse 80   Temp 97.7 F (36.5 C) (Oral)   Resp 18   Ht 5\' 6"  (1.676 m)   Wt 192 lb 7.4 oz (87.3 kg) Comment: scale A  SpO2 96%   BMI 31.06 kg/m    General: Alert, oriented, no distress.  Skin: normal turgor, no rashes, warm and dry HEENT: Normocephalic, atraumatic. Pupils equal round and reactive to light; sclera anicteric; extraocular muscles intact;  Nose without nasal septal hypertrophy Mouth/Parynx benign; Mallinpatti scale 3/4 Neck: Thick nesk No JVD, no carotid bruits; normal carotid upstroke Lungs: clear to ausculatation and percussion; no wheezing or rales Chest wall: without tenderness to palpitation Heart: PMI not displaced, RRR, s1 s2 normal, 1/6 systolic murmur, no diastolic murmur, no rubs, gallops, thrills, or heaves Abdomen: central obesity; soft, nontender; no hepatosplenomehaly, BS+; abdominal aorta nontender and not dilated by palpation. Back: no CVA tenderness Pulses 2+ Musculoskeletal: full range of motion, normal strength, no joint deformities Extremities: r3esolution of LE edema,. no clubbing cyanosis, Homan's sign negative  Neurologic: grossly nonfocal; Cranial nerves grossly wnl Psychologic: Normal mood and affect   Labs    Chemistry Recent Labs  Lab 12/20/16 1731 12/21/16 0652 12/22/16 0535  NA 141 143 143  K 3.8 3.3* 3.3*  CL 112* 109 105  CO2 19* 24 28  GLUCOSE 102* 91 93  BUN 29* 25* 24*  CREATININE 1.89* 1.75* 2.06*  CALCIUM 8.9 9.2 9.4  GFRNONAA 32* 36* 29*  GFRAA 38* 41* 34*  ANIONGAP 10 10 10      Hematology Recent Labs  Lab 12/20/16 1731 12/21/16 0652  WBC 5.7 5.2  RBC 4.77 4.36  HGB 12.2* 11.2*  HCT 38.7* 35.9*  MCV 81.1 82.3  MCH 25.6* 25.7*  MCHC 31.5 31.2  RDW 17.2* 17.8*  PLT 152 141*    Cardiac EnzymesNo results for input(s): TROPONINI in the last 168 hours.  Recent Labs  Lab 12/20/16 1753  TROPIPOC 0.01     BNP Recent Labs  Lab 12/20/16 1731  BNP 735.6*     DDimer No results for input(s): DDIMER in the last 168 hours.  Lipid Panel     Component Value Date/Time   CHOL 104 12/22/2016 0535   TRIG 91 12/22/2016 0535   HDL 29 (L) 12/22/2016 0535    CHOLHDL 3.6 12/22/2016 0535   VLDL 18 12/22/2016 0535   LDLCALC 57 12/22/2016 0535    Radiology    Dg Chest 2 View  Result Date: 12/20/2016 CLINICAL DATA:  Dyspnea on exertion for 4 weeks, significantly worsened today. EXAM: CHEST  2 VIEW COMPARISON:  08/08/2016 FINDINGS: Marked cardiomegaly, unchanged. Slight vascular prominence without frank interstitial edema. No airspace consolidation. No significant effusions. Prior sternotomy and CABG. IMPRESSION: Unchanged cardiomegaly. Worsened vascular fullness, without frank interstitial or alveolar edema. Electronically Signed   By: Andreas Newport M.D.   On: 12/20/2016 18:09    Cardiac Studies   ------------------------------------------------------------------- ECHO 12/22/2016 Study Conclusions  - Left ventricle: The cavity size was normal. Wall thickness was   increased in a pattern of moderate LVH. Systolic function was   mildly to moderately reduced. The estimated ejection fraction was   in the range of 40% to 45%. Diffuse hypokinesis. The study is not   technically sufficient to allow evaluation of LV diastolic   function. - Ventricular septum: Septal motion showed paradox. The contour   showed diastolic flattening. - Left atrium: The atrium was mildly dilated. - Right ventricle: The cavity size was mildly dilated. Wall   thickness was normal. - Tricuspid valve: There was moderate regurgitation. - Pulmonary arteries: Systolic pressure was moderately increased.   PA peak pressure: 60 mm Hg (S).  Impressions:  - Compared to the prior study, there has been no significant   interval change.  Patient Profile     [name] is a 76 y.o. male with a hx of history of CABG 1992, stent to LAD 2012, OSA on CPAP, HTN, afib on Xarelto, DVT 2015, HLD, obesity, gout, RBBB, EF 45-50% by echo 08/2016, CKD stage III, GERD, GI bleed on anticoagulation who is being seen today for the evaluation of CHF at the request of Dr.  Lonny Prude.  Assessment & Plan    1.  Cardiac arrhythmia;  Pt has second degree Type I block, but has had AF in past with RVR treated with BB. BB has been stopped with Mobitz type I block and rates in the upper 30's to 40.   Suspect sick sinus syndrome.  Also h/ NSVT in past. He has been markedly fatigued.  Will ask EP to see for consideration of pacemaker.   2. CAD s/p CABG with LIMA -LAD and diagonal in 1992 atretic LIMA s/p stenting to native LAD in 2012.  3. H/O DVTs in past  4. Anticoagulation:  For DVT and PAF;  had been stopped with GI bleeds earlier but resumed after endoscopy/colonoscopy  5. OSA on CPA;  Had been 100% compliant at home, not sleeping well in hospital; will order for CPAP Auto during hospitalization.  6. CKD with increased now to 2.06;  Will hold losartan with increased level.  Will change to nitrate/hydralazine with EF 40 - 45%.  7. Anemia  8. Hypokalemia; replete to 4  Signed, Troy Sine, MD, [facility] 12/22/2016, 2:06 PM

## 2016-12-22 NOTE — Telephone Encounter (Signed)
I reviewed his hospital chart and it looks like he is getting a pacemaker as well as diuresis. I hope he feels better after the pacemaker.

## 2016-12-22 NOTE — Progress Notes (Signed)
PROGRESS NOTE    Paul Price  DZH:[id number] DOB: [birth year] DOA: 12/20/2016 PCP: [name], MD   Brief Narrative: Paul Price is a 76 y.o. male with medical history significant of HTN, atrial fibrillation on Xarelto, DVT, CKD stage III. He presented with dyspnea with clinical concern for acute heart failure. BNP elevated to 735.6. Improvement with Lasix diuresis. Also with bradycardia.   Assessment & Plan:   Principal Problem:   Systolic congestive heart failure (HCC) Active Problems:   CAD - CABG '92, LAD DES 4/12, low risk Myoview 6/13   HTN (hypertension)   Hyperlipidemia with target LDL less than 70   PAF (paroxysmal atrial fibrillation) (HCC)   A-fib (HCC)   Normocytic anemia   Symptomatic bradycardia   Acute heart failure with slightly reduced EF Last EF of 45-50% as seen on echo from 08/09/16. New EF of 40-45% per echo on 12/13 with diffuse hypokinesis. Clinically, patient sounds like he has a CHF exacerbation. Improvement in symptoms from yesterday with IV diuresis. UOP of 3.75 L over the last 24 hours and weight of 5.5 kg from admission. Patient with history of atrial fibrillation which could have possibly contributed, but rate is controlled with episode of bradycardia. Creatinine up towards baseline -standing weights/strict in/out -transition to oral Lasix -cardiology recommendations -daily BMP -PT eval  Acute respiratory failure with hypoxia Secondary to heart failure -wean O2 to room air  Atrial fibrillation Rate controlled. On Xarelto. Followed by Dr. Claiborne Billings as an outpatient.  History of DVT On Xarelto for atrial fibrillation.  First degree heart block Bradycardia -Cardiology recommendations  Normocytic anemia Above recent baseline of around 8. Stable currently. Iron panel without ferritin level. -ferritin add-on  Essential hypertension -Continue losartan and amlodipine. Will need to watch creatinine/potassium in setting of diuresis and  losartan  Dysuria Urine culture pending  CKD stage III Baseline creatinine of 2. Has increased to around baseline  Hyperlipidemia -Continue atorvastatin and fenofibrate  GERD -Continue Protonix   DVT prophylaxis: Xarelto Code Status: Full code Family Communication: None at bedside Disposition Plan: When medically stable, likely home.   Consultants:   Cardiology  Procedures:    Echocardiogram (12/21/2016) Study Conclusions  - Left ventricle: The cavity size was normal. Wall thickness was   increased in a pattern of moderate LVH. Systolic function was   mildly to moderately reduced. The estimated ejection fraction was   in the range of 40% to 45%. Diffuse hypokinesis. The study is not   technically sufficient to allow evaluation of LV diastolic   function. - Ventricular septum: Septal motion showed paradox. The contour   showed diastolic flattening. - Left atrium: The atrium was mildly dilated. - Right ventricle: The cavity size was mildly dilated. Wall   thickness was normal. - Tricuspid valve: There was moderate regurgitation. - Pulmonary arteries: Systolic pressure was moderately increased.   PA peak pressure: 60 mm Hg (S).  Impressions:  - Compared to the prior study, there has been no significant   interval change.  Antimicrobials:  None    Subjective: Dyspnea improved today. Able to walk to the restroom with less fatigue. No chest pain.  Objective: Vitals:   12/21/16 2100 12/21/16 2348 12/22/16 0443 12/22/16 0847  BP: (!) 168/75 (!) 161/72 (!) 164/86 (!) 150/66  Pulse: 74 81 80 70  Resp: 18 18 18    Temp: 98.3 F (36.8 C) 97.8 F (36.6 C) 97.7 F (36.5 C)   TempSrc: Oral Oral Oral   SpO2: 93% 96%  94%   Weight:   87.3 kg (192 lb 7.4 oz)   Height:        Intake/Output Summary (Last 24 hours) at 12/22/2016 0922 Last data filed at 12/22/2016 0514 Gross per 24 hour  Intake 720 ml  Output 3750 ml  Net -3030 ml   Filed Weights    12/20/16 2243 12/21/16 0504 12/22/16 0443  Weight: 92.8 kg (204 lb 8 oz) 91.2 kg (201 lb 1.6 oz) 87.3 kg (192 lb 7.4 oz)    Examination:  General exam: Appears calm and comfortable  Respiratory system: Clear to auscultation, slightly diminished at bases. Respiratory effort normal. Cardiovascular system: S1 & S2 heard, RRR. No murmurs, rubs, gallops or clicks. No JVD Gastrointestinal system: Abdomen is nondistended, soft and nontender. No organomegaly or masses felt. Normal bowel sounds heard. Central nervous system: Alert and oriented. No focal neurological deficits. Extremities: No pitting edema. No calf tenderness Skin: No cyanosis. No rashes Psychiatry: Judgement and insight appear normal. Mood & affect appropriate.     Data Reviewed: I have personally reviewed following labs and imaging studies  CBC: Recent Labs  Lab 12/20/16 1731 12/21/16 0652  WBC 5.7 5.2  NEUTROABS  --  3.6  HGB 12.2* 11.2*  HCT 38.7* 35.9*  MCV 81.1 82.3  PLT 152 562*   Basic Metabolic Panel: Recent Labs  Lab 12/20/16 1731 12/21/16 0652 12/22/16 0535  NA 141 143 143  K 3.8 3.3* 3.3*  CL 112* 109 105  CO2 19* 24 28  GLUCOSE 102* 91 93  BUN 29* 25* 24*  CREATININE 1.89* 1.75* 2.06*  CALCIUM 8.9 9.2 9.4   GFR: Estimated Creatinine Clearance: 30.6 mL/min (A) (by C-G formula based on SCr of 2.06 mg/dL (H)). Liver Function Tests: No results for input(s): AST, ALT, ALKPHOS, BILITOT, PROT, ALBUMIN in the last 168 hours. No results for input(s): LIPASE, AMYLASE in the last 168 hours. No results for input(s): AMMONIA in the last 168 hours. Coagulation Profile: No results for input(s): INR, PROTIME in the last 168 hours. Cardiac Enzymes: No results for input(s): CKTOTAL, CKMB, CKMBINDEX, TROPONINI in the last 168 hours. BNP (last 3 results) No results for input(s): PROBNP in the last 8760 hours. HbA1C: No results for input(s): HGBA1C in the last 72 hours. CBG: No results for input(s): GLUCAP  in the last 168 hours. Lipid Profile: Recent Labs    12/22/16 0535  CHOL 104  HDL 29*  LDLCALC 57  TRIG 91  CHOLHDL 3.6   Thyroid Function Tests: Recent Labs    12/21/16 0652  TSH 3.605   Anemia Panel: Recent Labs    12/21/16 0652  FERRITIN 45  TIBC 480*  IRON 41*   Sepsis Labs: No results for input(s): PROCALCITON, LATICACIDVEN in the last 168 hours.  Recent Results (from the past 240 hour(s))  Urine Culture     Status: None   Collection Time: 12/21/16  7:21 AM  Result Value Ref Range Status   Specimen Description URINE, RANDOM  Final   Special Requests NONE  Final   Culture NO GROWTH  Final   Report Status 12/22/2016 FINAL  Final         Radiology Studies: Dg Chest 2 View  Result Date: 12/20/2016 CLINICAL DATA:  Dyspnea on exertion for 4 weeks, significantly worsened today. EXAM: CHEST  2 VIEW COMPARISON:  08/08/2016 FINDINGS: Marked cardiomegaly, unchanged. Slight vascular prominence without frank interstitial edema. No airspace consolidation. No significant effusions. Prior sternotomy and CABG. IMPRESSION: Unchanged cardiomegaly. Worsened  vascular fullness, without frank interstitial or alveolar edema. Electronically Signed   By: Andreas Newport M.D.   On: 12/20/2016 18:09        Scheduled Meds: . allopurinol  300 mg Oral Daily  . amLODipine  5 mg Oral Daily  . atorvastatin  20 mg Oral Daily  . fenofibrate  160 mg Oral Daily  . [START ON 12/23/2016] furosemide  40 mg Oral Daily  . losartan  100 mg Oral Daily  . Melatonin  4.5 mg Oral QHS  . pantoprazole  40 mg Oral Daily  . potassium chloride  40 mEq Oral Daily  . Rivaroxaban  15 mg Oral Q supper  . sodium chloride flush  3 mL Intravenous Q12H  . tamsulosin  0.4 mg Oral Daily   Continuous Infusions: . sodium chloride       LOS: 2 days     Cordelia Poche, MD [facility] 12/22/2016, 9:22 AM Pager: [phone]  If 7PM-7AM, please contact night-coverage [email or web address] Password  [email or web address] 12/22/2016, 9:22 AM

## 2016-12-23 LAB — GLUCOSE, CAPILLARY
Glucose-Capillary: 92 mg/dL (ref 65–99)
Glucose-Capillary: 160 mg/dL — ABNORMAL HIGH (ref 65–99)
Glucose-Capillary: 115 mg/dL — ABNORMAL HIGH (ref 65–99)
Glucose-Capillary: 106 mg/dL — ABNORMAL HIGH (ref 65–99)

## 2016-12-23 LAB — BASIC METABOLIC PANEL
Anion gap: 14 (ref 5–15)
BUN: 27 mg/dL — ABNORMAL HIGH (ref 6–20)
CO2: 26 mmol/L (ref 22–32)
Calcium: 9.3 mg/dL (ref 8.9–10.3)
Chloride: 103 mmol/L (ref 101–111)
Creatinine, Ser: 2.12 mg/dL — ABNORMAL HIGH (ref 0.61–1.24)
GFR calc Af Amer: 33 mL/min — ABNORMAL LOW (ref >60)
GFR calc non Af Amer: 28 mL/min — ABNORMAL LOW (ref >60)
Glucose, Bld: 79 mg/dL (ref 65–99)
Potassium: 3.1 mmol/L — ABNORMAL LOW (ref 3.5–5.1)
Sodium: 143 mmol/L (ref 135–145)

## 2016-12-23 NOTE — Plan of Care (Signed)
Patient without complaint this shift, no episodes of bradycardia or weakness.  VSS.  Patient awaiting pacemaker placement 12/25/16.  Wife at bedside much of shift.

## 2016-12-23 NOTE — Plan of Care (Signed)
  Education: Knowledge of General Education information will improve 12/23/2016 0212 - Progressing by Tristan Schroeder, RN

## 2016-12-23 NOTE — Progress Notes (Signed)
PROGRESS NOTE    Paul Price  YQM:[id number] DOB: [birth year] DOA: 12/20/2016 PCP: [name], MD   Brief Narrative: Paul Price is a 76 y.o. male with medical history significant of HTN, atrial fibrillation on Xarelto, DVT, CKD stage III. He presented with dyspnea with clinical concern for acute heart failure. BNP elevated to 735.6. Improvement with Lasix diuresis. Also with bradycardia.   Assessment & Plan:   Principal Problem:   Systolic congestive heart failure (HCC) Active Problems:   CAD - CABG '92, LAD DES 4/12, low risk Myoview 6/13   HTN (hypertension)   Hyperlipidemia with target LDL less than 70   PAF (paroxysmal atrial fibrillation) (HCC)   A-fib (HCC)   Normocytic anemia   Symptomatic bradycardia   Second degree Mobitz I AV block   Acute heart failure with slightly reduced EF Last EF of 45-50% as seen on echo from 08/09/16. New EF of 40-45% per echo on 12/13 with diffuse hypokinesis. Clinically, patient sounds like he has a CHF exacerbation. Improvement in symptoms from yesterday with IV diuresis. UOP of 3.75 L over the last 24 hours and weight of 5.5 kg from admission. Patient with history of atrial fibrillation which could have possibly contributed, but rate is controlled with episode of bradycardia. Creatinine up towards baseline. Given lasix IV again last night. -standing weights/strict in/out -Diuresis per cardiology -cardiology recommendations -daily BMP  Acute respiratory failure with hypoxia Secondary to heart failure -wean O2 to room air  Atrial fibrillation Rate controlled. On Xarelto. Followed by Dr. Claiborne Billings as an outpatient.  History of DVT On Xarelto for atrial fibrillation.  Bradycardia -Cardiology recommendations: plan for pacemaker on 12/17  Normocytic anemia Above recent baseline of around 8. Stable currently. Appears secondary to iron deficiency.  Essential hypertension -Continue losartan and amlodipine. Will need to watch  creatinine/potassium in setting of diuresis and losartan  Dysuria Urine culture pending  CKD stage III Baseline creatinine of 2. Has increased to around baseline  Hyperlipidemia -Continue atorvastatin and fenofibrate  GERD -Continue Protonix  Hypokalemia Secondary to diuresis. -Continue Kdur   DVT prophylaxis: Xarelto Code Status: Full code Family Communication: None at bedside Disposition Plan: When medically stable, likely home.   Consultants:   Cardiology  Procedures:    Echocardiogram (12/21/2016) Study Conclusions  - Left ventricle: The cavity size was normal. Wall thickness was   increased in a pattern of moderate LVH. Systolic function was   mildly to moderately reduced. The estimated ejection fraction was   in the range of 40% to 45%. Diffuse hypokinesis. The study is not   technically sufficient to allow evaluation of LV diastolic   function. - Ventricular septum: Septal motion showed paradox. The contour   showed diastolic flattening. - Left atrium: The atrium was mildly dilated. - Right ventricle: The cavity size was mildly dilated. Wall   thickness was normal. - Tricuspid valve: There was moderate regurgitation. - Pulmonary arteries: Systolic pressure was moderately increased.   PA peak pressure: 60 mm Hg (S).  Impressions:  - Compared to the prior study, there has been no significant   interval change.  Antimicrobials:  None    Subjective: No chest pain or dyspnea.  Objective: Vitals:   12/22/16 2317 12/23/16 0554 12/23/16 0925 12/23/16 1217  BP: (!) 141/77 137/82 (!) 142/85 (!) 154/62  Pulse:   83 (!) 56  Resp:  18  18  Temp:  98.5 F (36.9 C)  97.8 F (36.6 C)  TempSrc:  Oral  Oral  SpO2:   97% 95%  Weight:  82.8 kg (182 lb 8 oz)    Height:        Intake/Output Summary (Last 24 hours) at 12/23/2016 1250 Last data filed at 12/23/2016 0900 Gross per 24 hour  Intake 590 ml  Output 1725 ml  Net -1135 ml   Filed Weights    12/21/16 0504 12/22/16 0443 12/23/16 0554  Weight: 91.2 kg (201 lb 1.6 oz) 87.3 kg (192 lb 7.4 oz) 82.8 kg (182 lb 8 oz)    Examination:  General exam: Appears calm and comfortable  Respiratory system: Clear to auscultation, slightly diminished at bases. Respiratory effort normal. Cardiovascular system: S1 & S2 heard, RRR. No murmurs, rubs, gallops or clicks. No JVD Gastrointestinal system: Abdomen is nondistended, soft and nontender. No organomegaly or masses felt. Normal bowel sounds heard. Central nervous system: Alert and oriented. No focal neurological deficits. Extremities: No pitting edema. No calf tenderness Skin: No cyanosis. No rashes Psychiatry: Judgement and insight appear normal. Mood & affect appropriate.     Data Reviewed: I have personally reviewed following labs and imaging studies  CBC: Recent Labs  Lab 12/20/16 1731 12/21/16 0652  WBC 5.7 5.2  NEUTROABS  --  3.6  HGB 12.2* 11.2*  HCT 38.7* 35.9*  MCV 81.1 82.3  PLT 152 505*   Basic Metabolic Panel: Recent Labs  Lab 12/20/16 1731 12/21/16 0652 12/22/16 0535 12/23/16 0410  NA 141 143 143 143  K 3.8 3.3* 3.3* 3.1*  CL 112* 109 105 103  CO2 19* 24 28 26   GLUCOSE 102* 91 93 79  BUN 29* 25* 24* 27*  CREATININE 1.89* 1.75* 2.06* 2.12*  CALCIUM 8.9 9.2 9.4 9.3   GFR: Estimated Creatinine Clearance: 29 mL/min (A) (by C-G formula based on SCr of 2.12 mg/dL (H)). Liver Function Tests: No results for input(s): AST, ALT, ALKPHOS, BILITOT, PROT, ALBUMIN in the last 168 hours. No results for input(s): LIPASE, AMYLASE in the last 168 hours. No results for input(s): AMMONIA in the last 168 hours. Coagulation Profile: No results for input(s): INR, PROTIME in the last 168 hours. Cardiac Enzymes: No results for input(s): CKTOTAL, CKMB, CKMBINDEX, TROPONINI in the last 168 hours. BNP (last 3 results) No results for input(s): PROBNP in the last 8760 hours. HbA1C: No results for input(s): HGBA1C in the last  72 hours. CBG: Recent Labs  Lab 12/23/16 0749 12/23/16 1105  GLUCAP 92 160*   Lipid Profile: Recent Labs    12/22/16 0535  CHOL 104  HDL 29*  LDLCALC 57  TRIG 91  CHOLHDL 3.6   Thyroid Function Tests: Recent Labs    12/21/16 0652  TSH 3.605   Anemia Panel: Recent Labs    12/21/16 0652  FERRITIN 45  TIBC 480*  IRON 41*   Sepsis Labs: No results for input(s): PROCALCITON, LATICACIDVEN in the last 168 hours.  Recent Results (from the past 240 hour(s))  Urine Culture     Status: None   Collection Time: 12/21/16  7:21 AM  Result Value Ref Range Status   Specimen Description URINE, RANDOM  Final   Special Requests NONE  Final   Culture NO GROWTH  Final   Report Status 12/22/2016 FINAL  Final  Surgical PCR screen     Status: None   Collection Time: 12/22/16  6:48 PM  Result Value Ref Range Status   MRSA, PCR NEGATIVE NEGATIVE Final   Staphylococcus aureus NEGATIVE NEGATIVE Final    Comment: (NOTE) The Xpert SA  Assay (FDA approved for NASAL specimens in patients 75 years of age and older), is one component of a comprehensive surveillance program. It is not intended to diagnose infection nor to guide or monitor treatment.          Radiology Studies: No results found.      Scheduled Meds: . allopurinol  300 mg Oral Daily  . amLODipine  5 mg Oral Daily  . atorvastatin  20 mg Oral Daily  . fenofibrate  160 mg Oral Daily  . gentamicin irrigation  80 mg Irrigation On Call  . hydrALAZINE  10 mg Oral Q8H  . Melatonin  6 mg Oral QHS  . pantoprazole  40 mg Oral Daily  . potassium chloride  40 mEq Oral Daily  . Rivaroxaban  15 mg Oral Q supper  . sodium chloride flush  3 mL Intravenous Q12H  . tamsulosin  0.4 mg Oral Daily   Continuous Infusions: . sodium chloride    . sodium chloride    . sodium chloride Stopped (12/22/16 2316)  .  ceFAZolin (ANCEF) IV       LOS: 3 days     Cordelia Poche, MD [facility] 12/23/2016, 12:50 PM Pager:  [phone]  If 7PM-7AM, please contact night-coverage [email or web address] Password [email or web address] 12/23/2016, 12:50 PM

## 2016-12-23 NOTE — Progress Notes (Signed)
Patient urinating in 24hr container, not able to measure urine, but patient is using the bathroom as normal.

## 2016-12-24 LAB — GLUCOSE, CAPILLARY
Glucose-Capillary: 94 mg/dL (ref 65–99)
Glucose-Capillary: 86 mg/dL (ref 65–99)
Glucose-Capillary: 129 mg/dL — ABNORMAL HIGH (ref 65–99)

## 2016-12-24 LAB — BASIC METABOLIC PANEL
Anion gap: 10 (ref 5–15)
BUN: 30 mg/dL — ABNORMAL HIGH (ref 6–20)
CO2: 26 mmol/L (ref 22–32)
Calcium: 9.1 mg/dL (ref 8.9–10.3)
Chloride: 103 mmol/L (ref 101–111)
Creatinine, Ser: 2.12 mg/dL — ABNORMAL HIGH (ref 0.61–1.24)
GFR calc Af Amer: 33 mL/min — ABNORMAL LOW (ref >60)
GFR calc non Af Amer: 28 mL/min — ABNORMAL LOW (ref >60)
Glucose, Bld: 89 mg/dL (ref 65–99)
Potassium: 3.7 mmol/L (ref 3.5–5.1)
Sodium: 139 mmol/L (ref 135–145)

## 2016-12-24 MED ORDER — SODIUM CHLORIDE 0.9 % IV SOLN
INTRAVENOUS | Status: DC
  Administered 2016-12-25 (×2): via INTRAVENOUS

## 2016-12-24 MED ORDER — CEFAZOLIN SODIUM-DEXTROSE 2-4 GM/100ML-% IV SOLN
2.0000 g | INTRAVENOUS | Status: AC
  Administered 2016-12-25: 2 g via INTRAVENOUS

## 2016-12-24 MED ORDER — FUROSEMIDE 40 MG PO TABS
40.0000 mg | ORAL_TABLET | Freq: Every day | ORAL | Status: DC
  Administered 2016-12-24 – 2016-12-25 (×2): 40 mg via ORAL
  Filled 2016-12-24 (×2): qty 1

## 2016-12-24 MED ORDER — GENTAMICIN SULFATE 40 MG/ML IJ SOLN
80.0000 mg | INTRAMUSCULAR | Status: AC
  Administered 2016-12-25: 80 mg

## 2016-12-24 NOTE — Progress Notes (Signed)
PROGRESS NOTE    Paul Price  XNT:[id number] DOB: 08-Jul-1940 DOA: 12/20/2016 PCP: Paul Pretty, MD   Brief Narrative: Paul Price is a 76 y.o. male with medical history significant of HTN, atrial fibrillation on Xarelto, DVT, CKD stage III. He presented with dyspnea with clinical concern for acute heart failure. BNP elevated to 735.6. Improvement with Lasix diuresis. Also with bradycardia.   Assessment & Plan:   Principal Problem:   Systolic congestive heart failure (HCC) Active Problems:   CAD - CABG '92, LAD DES 4/12, low risk Myoview 6/13   HTN (hypertension)   Hyperlipidemia with target LDL less than 70   PAF (paroxysmal atrial fibrillation) (HCC)   A-fib (HCC)   Normocytic anemia   Symptomatic bradycardia   Second degree Mobitz I AV block   Acute heart failure with slightly reduced EF Last EF of 45-50% as seen on echo from 08/09/16. New EF of 40-45% per echo on 12/13 with diffuse hypokinesis. Clinically, patient sounds like he has a CHF exacerbation. Improvement in symptoms from yesterday with IV diuresis. UOP of 1.750 L over the last 24 hours and weight of 9.7 kg from admission; Net of -6.5 L Patient with history of atrial fibrillation which could have possibly contributed, but rate is controlled with episode of bradycardia. Creatinine stable. -standing weights/strict in/out -Diuresis per cardiology (discussed and okay to restart home lasix 40 mg daily) -cardiology recommendations -daily BMP  Acute respiratory failure with hypoxia Secondary to heart failure -wean O2 to room air  Atrial fibrillation Rate controlled. On Xarelto. Followed by Dr. Claiborne Billings as an outpatient.  History of DVT On Xarelto for atrial fibrillation.  Bradycardia -Cardiology recommendations: plan for pacemaker on 12/17  Normocytic anemia Above recent baseline of around 8. Stable currently. Appears secondary to iron deficiency.  Essential hypertension -Continue losartan and amlodipine. Will  need to watch creatinine/potassium in setting of diuresis and losartan  Dysuria Urine culture pending  CKD stage III Baseline creatinine of 2. Stable  Hyperlipidemia -Continue atorvastatin and fenofibrate  GERD -Continue Protonix  Hypokalemia Secondary to diuresis. -Continue Kdur   DVT prophylaxis: Xarelto Code Status: Full code Family Communication: None at bedside Disposition Plan: When medically stable, likely home.   Consultants:   Cardiology  Procedures:    Echocardiogram (12/21/2016) Study Conclusions  - Left ventricle: The cavity size was normal. Wall thickness was   increased in a pattern of moderate LVH. Systolic function was   mildly to moderately reduced. The estimated ejection fraction was   in the range of 40% to 45%. Diffuse hypokinesis. The study is not   technically sufficient to allow evaluation of LV diastolic   function. - Ventricular septum: Septal motion showed paradox. The contour   showed diastolic flattening. - Left atrium: The atrium was mildly dilated. - Right ventricle: The cavity size was mildly dilated. Wall   thickness was normal. - Tricuspid valve: There was moderate regurgitation. - Pulmonary arteries: Systolic pressure was moderately increased.   PA peak pressure: 60 mm Hg (S).  Impressions:  - Compared to the prior study, there has been no significant   interval change.  Antimicrobials:  None    Subjective: No chest pain. Mild dyspnea when exerting hisself which has significantly improved from admission.  Objective: Vitals:   12/23/16 1217 12/23/16 1955 12/24/16 0525 12/24/16 1058  BP: (!) 154/62 (!) 136/42 (!) 138/47 (!) 142/68  Pulse: (!) 56 79 71 62  Resp: 18 18 18    Temp: 97.8 F (36.6 C)  97.9 F (36.6 C) (!) 97.3 F (36.3 C)   TempSrc: Oral Oral Oral   SpO2: 95% 95% 96% 99%  Weight:   83.1 kg (183 lb 3.2 oz)   Height:        Intake/Output Summary (Last 24 hours) at 12/24/2016 1129 Last data filed  at 12/24/2016 1108 Gross per 24 hour  Intake 840 ml  Output 1600 ml  Net -760 ml   Filed Weights   12/22/16 0443 12/23/16 0554 12/24/16 0525  Weight: 87.3 kg (192 lb 7.4 oz) 82.8 kg (182 lb 8 oz) 83.1 kg (183 lb 3.2 oz)    Examination:  General exam: Appears calm and comfortable  Respiratory system: Clear to auscultation, slightly diminished at bases. Respiratory effort normal. Cardiovascular system: S1 & S2 heard, Irregular rhythm, normal rate. No murmurs. No JVD Gastrointestinal system: Abdomen is nondistended, soft and nontender. No organomegaly or masses felt. Normal bowel sounds heard. Central nervous system: Alert and oriented. No focal neurological deficits. Extremities: No pitting edema. No calf tenderness Skin: No cyanosis. No rashes Psychiatry: Judgement and insight appear normal. Mood & affect appropriate.     Data Reviewed: I have personally reviewed following labs and imaging studies  CBC: Recent Labs  Lab 12/20/16 1731 12/21/16 0652  WBC 5.7 5.2  NEUTROABS  --  3.6  HGB 12.2* 11.2*  HCT 38.7* 35.9*  MCV 81.1 82.3  PLT 152 916*   Basic Metabolic Panel: Recent Labs  Lab 12/20/16 1731 12/21/16 0652 12/22/16 0535 12/23/16 0410 12/24/16 0335  NA 141 143 143 143 139  K 3.8 3.3* 3.3* 3.1* 3.7  CL 112* 109 105 103 103  CO2 19* 24 28 26 26   GLUCOSE 102* 91 93 79 89  BUN 29* 25* 24* 27* 30*  CREATININE 1.89* 1.75* 2.06* 2.12* 2.12*  CALCIUM 8.9 9.2 9.4 9.3 9.1   GFR: Estimated Creatinine Clearance: 29 mL/min (A) (by C-G formula based on SCr of 2.12 mg/dL (H)). Liver Function Tests: No results for input(s): AST, ALT, ALKPHOS, BILITOT, PROT, ALBUMIN in the last 168 hours. No results for input(s): LIPASE, AMYLASE in the last 168 hours. No results for input(s): AMMONIA in the last 168 hours. Coagulation Profile: No results for input(s): INR, PROTIME in the last 168 hours. Cardiac Enzymes: No results for input(s): CKTOTAL, CKMB, CKMBINDEX, TROPONINI in  the last 168 hours. BNP (last 3 results) No results for input(s): PROBNP in the last 8760 hours. HbA1C: No results for input(s): HGBA1C in the last 72 hours. CBG: Recent Labs  Lab 12/23/16 0749 12/23/16 1105 12/23/16 1649 12/23/16 2140 12/24/16 0717  GLUCAP 92 160* 115* 106* 94   Lipid Profile: Recent Labs    12/22/16 0535  CHOL 104  HDL 29*  LDLCALC 57  TRIG 91  CHOLHDL 3.6   Thyroid Function Tests: No results for input(s): TSH, T4TOTAL, FREET4, T3FREE, THYROIDAB in the last 72 hours. Anemia Panel: No results for input(s): VITAMINB12, FOLATE, FERRITIN, TIBC, IRON, RETICCTPCT in the last 72 hours. Sepsis Labs: No results for input(s): PROCALCITON, LATICACIDVEN in the last 168 hours.  Recent Results (from the past 240 hour(s))  Urine Culture     Status: None   Collection Time: 12/21/16  7:21 AM  Result Value Ref Range Status   Specimen Description URINE, RANDOM  Final   Special Requests NONE  Final   Culture NO GROWTH  Final   Report Status 12/22/2016 FINAL  Final  Surgical PCR screen     Status: None  Collection Time: 12/22/16  6:48 PM  Result Value Ref Range Status   MRSA, PCR NEGATIVE NEGATIVE Final   Staphylococcus aureus NEGATIVE NEGATIVE Final    Comment: (NOTE) The Xpert SA Assay (FDA approved for NASAL specimens in patients 12 years of age and older), is one component of a comprehensive surveillance program. It is not intended to diagnose infection nor to guide or monitor treatment.          Radiology Studies: No results found.      Scheduled Meds: . allopurinol  300 mg Oral Daily  . amLODipine  5 mg Oral Daily  . atorvastatin  20 mg Oral Daily  . fenofibrate  160 mg Oral Daily  . hydrALAZINE  10 mg Oral Q8H  . Melatonin  6 mg Oral QHS  . pantoprazole  40 mg Oral Daily  . potassium chloride  40 mEq Oral Daily  . Rivaroxaban  15 mg Oral Q supper  . sodium chloride flush  3 mL Intravenous Q12H  . tamsulosin  0.4 mg Oral Daily    Continuous Infusions: . sodium chloride    . sodium chloride    . sodium chloride Stopped (12/22/16 2316)     LOS: 4 days     Cordelia Poche, MD [facility] 12/24/2016, 11:29 AM Pager: [phone]  If 7PM-7AM, please contact night-coverage [email or web address] Password [email or web address] 12/24/2016, 11:29 AM

## 2016-12-24 NOTE — Plan of Care (Signed)
VSS, no complaints of chest pain or shortness of breath this shift.  Patient aware he is nothing by mouth after midnight for pacemaker placement 12/25/16.  Consent form signed and on chart.  Family at bedside.

## 2016-12-24 NOTE — Progress Notes (Signed)
PT refused CPAP.  

## 2016-12-25 ENCOUNTER — Ambulatory Visit: Payer: Medicare Other | Admitting: Cardiovascular Disease

## 2016-12-25 ENCOUNTER — Encounter (HOSPITAL_COMMUNITY): Payer: Self-pay | Admitting: Internal Medicine

## 2016-12-25 ENCOUNTER — Encounter (HOSPITAL_COMMUNITY)
Admission: EM | Disposition: A | Discharge status: Home / Self Care | Payer: Self-pay | Source: Home / Self Care | Attending: Family Medicine

## 2016-12-25 DIAGNOSIS — I495 Sick sinus syndrome: Secondary | ICD-10-CM

## 2016-12-25 DIAGNOSIS — N183 Chronic kidney disease, stage 3 (moderate): Secondary | ICD-10-CM

## 2016-12-25 HISTORY — PX: PACEMAKER IMPLANT: EP1218

## 2016-12-25 LAB — PROTEIN ELECTROPHORESIS, SERUM
A/G Ratio: 1.1 (ref 0.7–1.7)
Albumin ELP: 3.4 g/dL (ref 2.9–4.4)
Alpha-1-Globulin: 0.4 g/dL (ref 0.0–0.4)
Alpha-2-Globulin: 0.9 g/dL (ref 0.4–1.0)
Beta Globulin: 1.2 g/dL (ref 0.7–1.3)
Gamma Globulin: 0.7 g/dL (ref 0.4–1.8)
Globulin, Total: 3.2 g/dL (ref 2.2–3.9)
Total Protein ELP: 6.6 g/dL (ref 6.0–8.5)

## 2016-12-25 LAB — BASIC METABOLIC PANEL
Anion gap: 9 (ref 5–15)
BUN: 34 mg/dL — ABNORMAL HIGH (ref 6–20)
CO2: 27 mmol/L (ref 22–32)
Calcium: 9.5 mg/dL (ref 8.9–10.3)
Chloride: 106 mmol/L (ref 101–111)
Creatinine, Ser: 2.26 mg/dL — ABNORMAL HIGH (ref 0.61–1.24)
GFR calc Af Amer: 30 mL/min — ABNORMAL LOW (ref >60)
GFR calc non Af Amer: 26 mL/min — ABNORMAL LOW (ref >60)
Glucose, Bld: 94 mg/dL (ref 65–99)
Potassium: 4.3 mmol/L (ref 3.5–5.1)
Sodium: 142 mmol/L (ref 135–145)

## 2016-12-25 SURGERY — PACEMAKER IMPLANT

## 2016-12-25 MED ORDER — LIDOCAINE HCL (PF) 1 % IJ SOLN
INTRAMUSCULAR | Status: DC | PRN
  Administered 2016-12-25: 45 mL

## 2016-12-25 MED ORDER — MIDAZOLAM HCL 5 MG/5ML IJ SOLN
INTRAMUSCULAR | Status: AC
  Filled 2016-12-25: qty 5

## 2016-12-25 MED ORDER — SODIUM CHLORIDE 0.9 % IV SOLN: INTRAVENOUS | Status: AC

## 2016-12-25 MED ORDER — HEPARIN (PORCINE) IN NACL 2-0.9 UNIT/ML-% IJ SOLN
INTRAMUSCULAR | Status: AC | PRN
  Administered 2016-12-25: 500 mL

## 2016-12-25 MED ORDER — FENTANYL CITRATE (PF) 100 MCG/2ML IJ SOLN
INTRAMUSCULAR | Status: AC
  Filled 2016-12-25: qty 2

## 2016-12-25 MED ORDER — LIDOCAINE HCL (PF) 1 % IJ SOLN
INTRAMUSCULAR | Status: AC
  Filled 2016-12-25: qty 30

## 2016-12-25 MED ORDER — SODIUM CHLORIDE 0.9 % IR SOLN
Status: AC
  Filled 2016-12-25: qty 2

## 2016-12-25 MED ORDER — METOPROLOL SUCCINATE ER 50 MG PO TB24
50.0000 mg | ORAL_TABLET | Freq: Every day | ORAL | Status: DC
  Administered 2016-12-25: 50 mg via ORAL
  Filled 2016-12-25 (×2): qty 1

## 2016-12-25 MED ORDER — ACETAMINOPHEN 325 MG PO TABS: 325.0000 mg | ORAL_TABLET | ORAL | Status: DC | PRN

## 2016-12-25 MED ORDER — CEFAZOLIN SODIUM-DEXTROSE 1-4 GM/50ML-% IV SOLN
1.0000 g | Freq: Four times a day (QID) | INTRAVENOUS | Status: AC
  Administered 2016-12-25 – 2016-12-26 (×3): 1 g via INTRAVENOUS
  Filled 2016-12-25 (×3): qty 50

## 2016-12-25 MED ORDER — FENTANYL CITRATE (PF) 100 MCG/2ML IJ SOLN
INTRAMUSCULAR | Status: DC | PRN
  Administered 2016-12-25: 12.5 ug via INTRAVENOUS
  Administered 2016-12-25: 25 ug via INTRAVENOUS
  Administered 2016-12-25: 50 ug via INTRAVENOUS
  Administered 2016-12-25: 12.5 ug via INTRAVENOUS

## 2016-12-25 MED ORDER — CEFAZOLIN SODIUM-DEXTROSE 2-4 GM/100ML-% IV SOLN
INTRAVENOUS | Status: AC
  Filled 2016-12-25: qty 100

## 2016-12-25 MED ORDER — HEPARIN (PORCINE) IN NACL 2-0.9 UNIT/ML-% IJ SOLN
INTRAMUSCULAR | Status: AC
  Filled 2016-12-25: qty 500

## 2016-12-25 MED ORDER — ONDANSETRON HCL 4 MG/2ML IJ SOLN: 4.0000 mg | Freq: Four times a day (QID) | INTRAMUSCULAR | Status: DC | PRN

## 2016-12-25 MED ORDER — MIDAZOLAM HCL 5 MG/5ML IJ SOLN
INTRAMUSCULAR | Status: DC | PRN
  Administered 2016-12-25: 2 mg via INTRAVENOUS
  Administered 2016-12-25 (×3): 1 mg via INTRAVENOUS

## 2016-12-25 SURGICAL SUPPLY — 12 items
CABLE SURGICAL S-101-97-12 (CABLE) ×2 IMPLANT
CATH RIGHTSITE C315HIS02 (CATHETERS) ×2 IMPLANT
IPG PACE AZUR XT DR MRI W1DR01 (Pacemaker) ×1 IMPLANT
LEAD CAPSURE NOVUS 5076-52CM (Lead) ×2 IMPLANT
LEAD SELECT SECURE 3830 383069 (Lead) ×1 IMPLANT
PACE AZURE XT DR MRI W1DR01 (Pacemaker) ×2 IMPLANT
PAD DEFIB LIFELINK (PAD) ×2 IMPLANT
SELECT SECURE 3830 383069 (Lead) ×2 IMPLANT
SHEATH CLASSIC 7F (SHEATH) ×4 IMPLANT
SLITTER 6232ADJ (MISCELLANEOUS) ×2 IMPLANT
TRAY PACEMAKER INSERTION (PACKS) ×2 IMPLANT
WIRE HI TORQ VERSACORE-J 145CM (WIRE) ×2 IMPLANT

## 2016-12-25 NOTE — Telephone Encounter (Signed)
[place]

## 2016-12-25 NOTE — Progress Notes (Signed)
Electrophysiology Rounding Note  [name] Date of Encounter: 12/25/2016  Primary Cardiologist: Claiborne Billings Electrophysiologist: Caryl Comes (new this admission)   Subjective   The patient is doing well today.  At this time, the patient denies chest pain, shortness of breath, or any new concerns.  Inpatient Medications    Scheduled Meds: . allopurinol  300 mg Oral Daily  . amLODipine  5 mg Oral Daily  . atorvastatin  20 mg Oral Daily  . fenofibrate  160 mg Oral Daily  . furosemide  40 mg Oral Daily  . gentamicin irrigation  80 mg Irrigation To Cath  . hydrALAZINE  10 mg Oral Q8H  . Melatonin  6 mg Oral QHS  . pantoprazole  40 mg Oral Daily  . potassium chloride  40 mEq Oral Daily  . Rivaroxaban  15 mg Oral Q supper  . sodium chloride flush  3 mL Intravenous Q12H  . tamsulosin  0.4 mg Oral Daily   Continuous Infusions: . sodium chloride    . sodium chloride    . sodium chloride Stopped (12/22/16 2316)  . sodium chloride 50 mL/hr at 12/25/16 0611  .  ceFAZolin (ANCEF) IV     PRN Meds: sodium chloride, acetaminophen, ondansetron (ZOFRAN) IV, sodium chloride flush   Vital Signs    Vitals:   12/24/16 1300 12/24/16 1511 12/24/16 2005 12/25/16 0539  BP: 127/65 (!) 116/59 (!) 133/53 140/73  Pulse: 86 70 71 92  Resp: 20  20 18   Temp: (!) 97.5 F (36.4 C)  98.1 F (36.7 C) 97.7 F (36.5 C)  TempSrc: Oral  Oral Oral  SpO2: 94%  98% 96%  Weight:    181 lb 14.4 oz (82.5 kg)  Height:        Intake/Output Summary (Last 24 hours) at 12/25/2016 0746 Last data filed at 12/25/2016 0600 Gross per 24 hour  Intake 580 ml  Output 1625 ml  Net -1045 ml   Filed Weights   12/23/16 0554 12/24/16 0525 12/25/16 0539  Weight: 182 lb 8 oz (82.8 kg) 183 lb 3.2 oz (83.1 kg) 181 lb 14.4 oz (82.5 kg)    Physical Exam    GEN- The patient is elderly appearing, alert and oriented x 3 today.   Head- normocephalic, atraumatic Eyes-  Sclera clear, conjunctiva pink Ears-  hearing intact Oropharynx- clear Neck- supple Lungs- Clear to ausculation bilaterally, normal work of breathing Heart- Irregular rate and rhythm  GI- soft, NT, ND, + BS Extremities- no clubbing, cyanosis, or edema Skin- no rash or lesion Psych- euthymic mood, full affect Neuro- strength and sensation are intact  Labs  Basic Metabolic Panel Recent Labs    12/24/16 0335 12/25/16 0511  NA 139 142  K 3.7 4.3  CL 103 106  CO2 26 27  GLUCOSE 89 94  BUN 30* 34*  CREATININE 2.12* 2.26*  CALCIUM 9.1 9.5     Telemetry    Sinus rhythm, PAC's, sinus brady  (personally reviewed)  Radiology    No results found.   Patient Profile     Paul Price is a 76 y.o. male admitted with progressive weakness and found to have symptomatic bradycardia .  Assessment & Plan    1.  Symptomatic bradycardia Plan for PPM implant today with Dr Caryl Comes Risks, benefits reviewed end of last week  2.  OSA Compliance with CPAP encouraged  3.  CKD Likely at baseline Will need to be careful with diuresis  4.  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

## 2016-12-25 NOTE — Progress Notes (Signed)
PROGRESS NOTE    Paul Price  VFI:[id number] DOB: 08-12-1940 DOA: 12/20/2016 PCP: [name], MD   Brief Narrative: Paul Price is a 76 y.o. male with medical history significant of HTN, atrial fibrillation on Xarelto, DVT, CKD stage III. He presented with dyspnea with clinical concern for acute heart failure. BNP elevated to 735.6. Improvement with Lasix diuresis. Also with bradycardia.   Assessment & Plan:   Principal Problem:   Systolic congestive heart failure (HCC) Active Problems:   CAD - CABG '92, LAD DES 4/12, low risk Myoview 6/13   HTN (hypertension)   Hyperlipidemia with target LDL less than 70   PAF (paroxysmal atrial fibrillation) (HCC)   A-fib (HCC)   Normocytic anemia   Symptomatic bradycardia   Second degree Mobitz I AV block   Acute heart failure with slightly reduced EF Last EF of 45-50% as seen on echo from 08/09/16. New EF of 40-45% per echo on 12/13 with diffuse hypokinesis. Clinically, patient sounds like he has a CHF exacerbation. Improvement in symptoms from yesterday with IV diuresis. UOP of 1.625 L over the last 24 hours and weight of 10.3 kg from admission; Net of -7.5 L Patient with history of atrial fibrillation which could have possibly contributed, but rate is controlled with episode of bradycardia. Creatinine slightly increased. -standing weights/strict in/out -Diuresis per cardiology -hold Lasix secondary to up-trending creatinine -cardiology recommendations -daily BMP  Acute respiratory failure with hypoxia Secondary to heart failure -wean O2 to room air  Atrial fibrillation Rate controlled. On Xarelto. Followed by Dr. Claiborne Billings as an outpatient.  History of DVT On Xarelto for atrial fibrillation.  Bradycardia -Cardiology recommendations: plan for pacemaker on 12/17  Normocytic anemia Above recent baseline of around 8. Stable currently. Appears secondary to iron deficiency.  Essential hypertension -Continue losartan and  amlodipine. Will need to watch creatinine/potassium in setting of diuresis and losartan  Dysuria Urine culture pending  CKD stage III Baseline creatinine of 2. Stable  Hyperlipidemia -Continue atorvastatin and fenofibrate  GERD -Continue Protonix  Hypokalemia Secondary to diuresis. -Continue Kdur   DVT prophylaxis: Xarelto Code Status: Full code Family Communication: Wife, son and pastor at bedside Disposition Plan: When medically stable, likely home.   Consultants:   Cardiology  Procedures:    Echocardiogram (12/21/2016) Study Conclusions  - Left ventricle: The cavity size was normal. Wall thickness was   increased in a pattern of moderate LVH. Systolic function was   mildly to moderately reduced. The estimated ejection fraction was   in the range of 40% to 45%. Diffuse hypokinesis. The study is not   technically sufficient to allow evaluation of LV diastolic   function. - Ventricular septum: Septal motion showed paradox. The contour   showed diastolic flattening. - Left atrium: The atrium was mildly dilated. - Right ventricle: The cavity size was mildly dilated. Wall   thickness was normal. - Tricuspid valve: There was moderate regurgitation. - Pulmonary arteries: Systolic pressure was moderately increased.   PA peak pressure: 60 mm Hg (S).  Impressions:  - Compared to the prior study, there has been no significant   interval change.  Antimicrobials:  None    Subjective: No chest pain or dyspnea. Awaiting procedure.  Objective: Vitals:   12/24/16 1300 12/24/16 1511 12/24/16 2005 12/25/16 0539  BP: 127/65 (!) 116/59 (!) 133/53 140/73  Pulse: 86 70 71 92  Resp: 20  20 18   Temp: (!) 97.5 F (36.4 C)  98.1 F (36.7 C) 97.7 F (36.5 C)  TempSrc: Oral  Oral Oral  SpO2: 94%  98% 96%  Weight:    82.5 kg (181 lb 14.4 oz)  Height:        Intake/Output Summary (Last 24 hours) at 12/25/2016 1107 Last data filed at 12/25/2016 1009 Gross per 24  hour  Intake 580 ml  Output 1625 ml  Net -1045 ml   Filed Weights   12/23/16 0554 12/24/16 0525 12/25/16 0539  Weight: 82.8 kg (182 lb 8 oz) 83.1 kg (183 lb 3.2 oz) 82.5 kg (181 lb 14.4 oz)    Examination:  General exam: Appears calm and comfortable  Respiratory system: Clear to auscultation, slightly diminished at bases. Respiratory effort normal. Cardiovascular system: S1 & S2 heard, Irregular rhythm, normal rate. No murmurs. No JVD Gastrointestinal system: Abdomen is nondistended, soft and nontender. No organomegaly or masses felt. Normal bowel sounds heard. Central nervous system: Alert and oriented. No focal neurological deficits. Extremities: No pitting edema. No calf tenderness Skin: No cyanosis. No rashes Psychiatry: Judgement and insight appear normal. Mood & affect appropriate.     Data Reviewed: I have personally reviewed following labs and imaging studies  CBC: Recent Labs  Lab 12/20/16 1731 12/21/16 0652  WBC 5.7 5.2  NEUTROABS  --  3.6  HGB 12.2* 11.2*  HCT 38.7* 35.9*  MCV 81.1 82.3  PLT 152 081*   Basic Metabolic Panel: Recent Labs  Lab 12/21/16 0652 12/22/16 0535 12/23/16 0410 12/24/16 0335 12/25/16 0511  NA 143 143 143 139 142  K 3.3* 3.3* 3.1* 3.7 4.3  CL 109 105 103 103 106  CO2 24 28 26 26 27   GLUCOSE 91 93 79 89 94  BUN 25* 24* 27* 30* 34*  CREATININE 1.75* 2.06* 2.12* 2.12* 2.26*  CALCIUM 9.2 9.4 9.3 9.1 9.5   GFR: Estimated Creatinine Clearance: 27.2 mL/min (A) (by C-G formula based on SCr of 2.26 mg/dL (H)). Liver Function Tests: No results for input(s): AST, ALT, ALKPHOS, BILITOT, PROT, ALBUMIN in the last 168 hours. No results for input(s): LIPASE, AMYLASE in the last 168 hours. No results for input(s): AMMONIA in the last 168 hours. Coagulation Profile: No results for input(s): INR, PROTIME in the last 168 hours. Cardiac Enzymes: No results for input(s): CKTOTAL, CKMB, CKMBINDEX, TROPONINI in the last 168 hours. BNP (last 3  results) No results for input(s): PROBNP in the last 8760 hours. HbA1C: No results for input(s): HGBA1C in the last 72 hours. CBG: Recent Labs  Lab 12/23/16 1649 12/23/16 2140 12/24/16 0717 12/24/16 1129 12/24/16 1640  GLUCAP 115* 106* 94 86 129*   Lipid Profile: No results for input(s): CHOL, HDL, LDLCALC, TRIG, CHOLHDL, LDLDIRECT in the last 72 hours. Thyroid Function Tests: No results for input(s): TSH, T4TOTAL, FREET4, T3FREE, THYROIDAB in the last 72 hours. Anemia Panel: No results for input(s): VITAMINB12, FOLATE, FERRITIN, TIBC, IRON, RETICCTPCT in the last 72 hours. Sepsis Labs: No results for input(s): PROCALCITON, LATICACIDVEN in the last 168 hours.  Recent Results (from the past 240 hour(s))  Urine Culture     Status: None   Collection Time: 12/21/16  7:21 AM  Result Value Ref Range Status   Specimen Description URINE, RANDOM  Final   Special Requests NONE  Final   Culture NO GROWTH  Final   Report Status 12/22/2016 FINAL  Final  Surgical PCR screen     Status: None   Collection Time: 12/22/16  6:48 PM  Result Value Ref Range Status   MRSA, PCR NEGATIVE NEGATIVE Final  Staphylococcus aureus NEGATIVE NEGATIVE Final    Comment: (NOTE) The Xpert SA Assay (FDA approved for NASAL specimens in patients 4 years of age and older), is one component of a comprehensive surveillance program. It is not intended to diagnose infection nor to guide or monitor treatment.          Radiology Studies: No results found.      Scheduled Meds: . allopurinol  300 mg Oral Daily  . amLODipine  5 mg Oral Daily  . atorvastatin  20 mg Oral Daily  . fenofibrate  160 mg Oral Daily  . furosemide  40 mg Oral Daily  . gentamicin irrigation  80 mg Irrigation To Cath  . hydrALAZINE  10 mg Oral Q8H  . Melatonin  6 mg Oral QHS  . pantoprazole  40 mg Oral Daily  . potassium chloride  40 mEq Oral Daily  . Rivaroxaban  15 mg Oral Q supper  . sodium chloride flush  3 mL  Intravenous Q12H  . tamsulosin  0.4 mg Oral Daily   Continuous Infusions: . sodium chloride    . sodium chloride    . sodium chloride Stopped (12/22/16 2316)  . sodium chloride 50 mL/hr at 12/25/16 0947  .  ceFAZolin (ANCEF) IV       LOS: 5 days     Cordelia Poche, MD [facility] 12/25/2016, 11:07 AM Pager: [phone]  If 7PM-7AM, please contact night-coverage [email or web address] Password [email or web address] 12/25/2016, 11:07 AM

## 2016-12-25 NOTE — Progress Notes (Signed)
Orthopedic Tech Progress Note Patient Details:  Paul Price April 20, 1940 [id number] Patient has arm sling. Patient ID: ARTIS BEGGS, male   DOB: 02-17-1940, 76 y.o.   MRN: [id number]   Braulio Bosch 12/25/2016, 3:00 PM

## 2016-12-26 ENCOUNTER — Inpatient Hospital Stay (HOSPITAL_COMMUNITY): Payer: Medicare Other

## 2016-12-26 DIAGNOSIS — K219 Gastro-esophageal reflux disease without esophagitis: Secondary | ICD-10-CM

## 2016-12-26 DIAGNOSIS — I498 Other specified cardiac arrhythmias: Secondary | ICD-10-CM

## 2016-12-26 DIAGNOSIS — D649 Anemia, unspecified: Secondary | ICD-10-CM

## 2016-12-26 DIAGNOSIS — I5023 Acute on chronic systolic (congestive) heart failure: Secondary | ICD-10-CM

## 2016-12-26 DIAGNOSIS — Z959 Presence of cardiac and vascular implant and graft, unspecified: Secondary | ICD-10-CM

## 2016-12-26 MED ORDER — HYDRALAZINE HCL 10 MG PO TABS: 10.0000 mg | ORAL_TABLET | Freq: Three times a day (TID) | ORAL | 1 refills | Status: DC

## 2016-12-26 MED ORDER — FUROSEMIDE 40 MG PO TABS: 20.0000 mg | ORAL_TABLET | Freq: Every day | ORAL | Status: DC

## 2016-12-26 MED ORDER — POTASSIUM CHLORIDE CRYS ER 20 MEQ PO TBCR: 20.0000 meq | EXTENDED_RELEASE_TABLET | Freq: Every day | ORAL | Status: DC

## 2016-12-26 MED ORDER — AMLODIPINE BESYLATE 2.5 MG PO TABS
2.5000 mg | ORAL_TABLET | Freq: Every day | ORAL | Status: DC
  Administered 2016-12-26: 2.5 mg via ORAL
  Filled 2016-12-26: qty 1

## 2016-12-26 MED ORDER — AMLODIPINE BESYLATE 2.5 MG PO TABS: 2.5000 mg | ORAL_TABLET | Freq: Every day | ORAL | 1 refills | Status: DC

## 2016-12-26 MED ORDER — METOPROLOL SUCCINATE ER 25 MG PO TB24
25.0000 mg | ORAL_TABLET | Freq: Every day | ORAL | Status: DC
Start: 2016-12-26 — End: 2016-12-26
  Administered 2016-12-26: 25 mg via ORAL
  Filled 2016-12-26: qty 1

## 2016-12-26 NOTE — Progress Notes (Signed)
CXR done, patient getting pacemaker interrogated.

## 2016-12-26 NOTE — Progress Notes (Signed)
Progress Note  [name] Date of Encounter: 12/26/2016  Primary Cardiologist: No primary care provider on file.   Subjective   Feels well today, denies any CP or SOB, no palpitations, minimal if any discomfort at pacer site  Inpatient Medications    Scheduled Meds: . allopurinol  300 mg Oral Daily  . amLODipine  2.5 mg Oral Daily  . atorvastatin  20 mg Oral Daily  . fenofibrate  160 mg Oral Daily  . hydrALAZINE  10 mg Oral Q8H  . Melatonin  6 mg Oral QHS  . metoprolol succinate  25 mg Oral Daily  . pantoprazole  40 mg Oral Daily  . potassium chloride  40 mEq Oral Daily  . Rivaroxaban  15 mg Oral Q supper  . sodium chloride flush  3 mL Intravenous Q12H  . tamsulosin  0.4 mg Oral Daily   Continuous Infusions: . sodium chloride    . sodium chloride Stopped (12/25/16 2001)   PRN Meds: sodium chloride, acetaminophen, acetaminophen, ondansetron (ZOFRAN) IV, ondansetron (ZOFRAN) IV, sodium chloride flush   Vital Signs    Vitals:   12/25/16 2126 12/25/16 2337 12/26/16 0534 12/26/16 0545  BP: (!) 139/56 140/77 (!) 151/70   Pulse: (!) 54  81   Resp: 18  18   Temp: 98.5 F (36.9 C)  (!) 97.5 F (36.4 C)   TempSrc: Oral  Oral   SpO2: 94%     Weight:    182 lb (82.6 kg)  Height:        Intake/Output Summary (Last 24 hours) at 12/26/2016 0825 Last data filed at 12/26/2016 6237 Gross per 24 hour  Intake 540 ml  Output 800 ml  Net -260 ml   Filed Weights   12/24/16 0525 12/25/16 0539 12/26/16 0545  Weight: 183 lb 3.2 oz (83.1 kg) 181 lb 14.4 oz (82.5 kg) 182 lb (82.6 kg)    Telemetry    SR, intermittent pacing - Personally Reviewed  ECG     SR, PAC's, some AP beats (noted to be underlying rhythm tracing with device check)- Personally Reviewed  Physical Exam   GEN: No acute distress.   Neck: No JVD Cardiac: RRR, no murmurs, rubs, or gallops.  Respiratory: CTA b/l. GI: Soft, nontender, non-distended  MS: No edema; No deformity. Neuro:   Nonfocal  Psych: Normal affect   Implant site is dry, no bleeding, no hematoma or ecchymosis  Labs    Chemistry Recent Labs  Lab 12/23/16 0410 12/24/16 0335 12/25/16 0511  NA 143 139 142  K 3.1* 3.7 4.3  CL 103 103 106  CO2 26 26 27   GLUCOSE 79 89 94  BUN 27* 30* 34*  CREATININE 2.12* 2.12* 2.26*  CALCIUM 9.3 9.1 9.5  GFRNONAA 28* 28* 26*  GFRAA 33* 33* 30*  ANIONGAP 14 10 9      Hematology Recent Labs  Lab 12/20/16 1731 12/21/16 0652  WBC 5.7 5.2  RBC 4.77 4.36  HGB 12.2* 11.2*  HCT 38.7* 35.9*  MCV 81.1 82.3  MCH 25.6* 25.7*  MCHC 31.5 31.2  RDW 17.2* 17.8*  PLT 152 141*    Cardiac EnzymesNo results for input(s): TROPONINI in the last 168 hours.  Recent Labs  Lab 12/20/16 1753  TROPIPOC 0.01     BNP Recent Labs  Lab 12/20/16 1731  BNP 735.6*     DDimer No results for input(s): DDIMER in the last 168 hours.   Radiology    No results found.  Cardiac Studies   12/21/16: TTE Study Conclusion - Left ventricle: The cavity size was normal. Wall thickness was   increased in a pattern of moderate LVH. Systolic function was   mildly to moderately reduced. The estimated ejection fraction was   in the range of 40% to 45%. Diffuse hypokinesis. The study is not   technically sufficient to allow evaluation of LV diastolic   function. - Ventricular septum: Septal motion showed paradox. The contour   showed diastolic flattening. - Left atrium: The atrium was mildly dilated. - Right ventricle: The cavity size was mildly dilated. Wall   thickness was normal. - Tricuspid valve: There was moderate regurgitation. - Pulmonary arteries: Systolic pressure was moderately increased.   PA peak pressure: 60 mm Hg (S). Impressions: - Compared to the prior study, there has been no significant   interval change.  Patient Profile     76 y.o. male PAFib, OSA, CRI, and symptomatic bradycardia, now s/p PPM implant  Assessment & Plan    1.  Symptomatic  bradycardia POD #1, PPM implant, Dr. Caryl Comes CXR reviewed by Dr. Caryl Comes, lead positions stable Device check this AM with intact function Site is stable  Wound care, activity restrictions were discussed with the patient Routine post implant appointments have been made  The patient was seen and examined by Dr. Caryl Comes  PENDING official CXR read, if no pneumothorax, discharge from our standpoint ok for today once device rep completes teaching and paring of device  2.  OSA Compliance with CPAP encouraged  3.  CKD Likely at baseline Will need to be careful with diuresis  4.  Paroxysmal atrial fibrillation Currently maintaining SR Continue Xarelto (renally dosed) long term for CHADS2VASC of 7  5. Symptomatic/significant orthostatic VS check today     Reduce Amlodipine and metoprolol     Follow his HTN     May require compressive stocking/binder if becomes problematic Pt describes long hx of orthostatic dizziness, easily managed by standing slowly, sitting when needed     For questions or updates, please contact [facility] Please consult [email or web address] for contact info under Cardiology/STEMI.      Signed, Baldwin Jamaica, PA-C  12/26/2016, 8:25 AM

## 2016-12-26 NOTE — Progress Notes (Signed)
Daily ortho VS done, ortho VS positive and patient felt weak/nauseous after standing for about 17min.

## 2016-12-26 NOTE — Discharge Summary (Signed)
Physician Discharge Summary  Paul Price DXA:[id number] DOB: 08-25-1940 DOA: 12/20/2016  PCP: [name], MD  Admit date: 12/20/2016 Discharge date: 12/26/2016  Time spent: 35 minutes  Recommendations for Outpatient Follow-up:  1. Repeat BMET to follow electrolytes and renal function  2. Repeat CBC to follow Hgb trend  3. Reassess BP and adjust antihypertensive regimen as needed  4. Once renal function and BP stable resume ARB.  Discharge Diagnoses:    Acute on chronic Systolic congestive heart failure (HCC)   CAD - CABG '92, LAD DES 4/12, low risk Myoview 6/13   HTN (hypertension)   Hyperlipidemia with target LDL less than 70   PAF (paroxysmal atrial fibrillation) (HCC)   Normocytic anemia   Symptomatic bradycardia   Second degree Mobitz I AV block   Discharge Condition: stable and improved. Discharge home with instructions to follow up with PCP and cardiology service as an outpatient.  Diet recommendation: heart healthy diet   Filed Weights   12/24/16 0525 12/25/16 0539 12/26/16 0545  Weight: 83.1 kg (183 lb 3.2 oz) 82.5 kg (181 lb 14.4 oz) 82.6 kg (182 lb)    History of present illness:  As per H&P written by Dr. Tamala Julian on 12/21/16 76 y.o. male with medical history significant of HTN, atrial fibrillation on Xarelto, DVT, CKD stage III; who presents with complaints of 4 weeks of progressively worsening shortness of breath.  Patient reports being unable to walk across his home without becoming extremely short of breath having to rest.  He has been unable to get much sleep at night as he is unable to lay flat.  Currently, reports sleeping in a chair at night.  Associated symptoms included leg swelling, intermittent burning with urination, intermittent dry cough, and reports of a fall 1 week ago.  He reports that over the several weeks he he has lost  Ablout 20 pounds of weight.  Patient is being followed by Dr. Claiborne Billings of cardiology. Review of records shows notes that his  Toprolol XL was cut from 50 mg twice daily to 25 mg twice daily on 11/26 for bradycardia.  Then he was recommended on 12/4 to decrease dosage to Toprol XL 25 mg daily.  ED Course: Upon admission into the emergency department patient was seen to be afebrile, pulse 38-69, blood pressures 124/89 -167/69, and O2 saturations 96-100% currently on 3 L of nasal cannula oxygen.  Patient was never noted to be hypoxic but O2 saturations dropped as low as 89-90% with ambulation.   Hospital Course:  Acute heart failure with slightly reduced EF -Last EF of 40-45% per echo on 12/13 with diffuse hypokinesis. -symptoms improved/resolved with IV diuresis -no CP and no SOB at discharge -patient s/p pacemaker implantation  -lasix dose adjusted at discharge -patient metoprolol dose was also adjusted and he has been started on hydralazine -no ACE or ARB currently given soft BP and increase in renal function  -will have close follow up with cardiology service for further medication adjustment as an outpatient. -educated about low sodium diet, adequate hydration and importance on daily weights.  -repeat BMET at follow up to assess electrolytes and renal function.  Acute respiratory failure with hypoxia -Secondary to heart failure exacerbation -improved/resolved -at discharge no SOB and good O2 sat on RA  Paroxysmal Atrial fibrillation -Rate controlled and currently sinus. -On Xarelto.  -continue follow up with Dr. Merri Brunette service as an outpatient.  History of DVT -On Xarelto for atrial fibrillation. -no LE swelling or pain at discharge  Bradycardia -s/p pacemaker on 12/17 -will follow up with cardiology as an outpatient for further work up and recommendations  Normocytic anemia -stable and at baseline -recommend CBC at follow up visit to assess his Hgb level   Essential hypertension/orthostaic hypotension  -will continue adjusted antihypertensive regimen as dictated by cardiology service  and follow VS. -patient discharge on low dose lasix, amlodipine and metoprolol -also on hydralazine -cozaar stop until follow up visit (given orthostatic changes/soft BP already and slight increase in renal function) -further adjustment to be done base on BP fluctuation   Dysuria -resolved -no signs of infection appreciated on UA (negative nitrite and Leukocytes) -urine culture w/o growth.  CKD stage III -Baseline creatinine around 2.  -essentially at baseline; even Cr went up some in setting of diuresis and soft BP.  -advise to maintain adequate hydration -lasix dose has been adjusted and will recommend BMET to be repeated at follow up visit to assess renal function trend.  Hyperlipidemia -Will continue Lipitor and Fenofibrate   GERD -Continue PPPI  Hypokalemia -repleted and stable -will discharge on maintenance supplementation    Procedures:  See below for x-ray reports   Pace maker implantation on 12/17  2-D echo: - Left ventricle: The cavity size was normal. Wall thickness was increased in a pattern of moderate LVH. Systolic function was mildly to moderately reduced. The estimated ejection fraction was in the range of 40% to 45%. Diffuse hypokinesis. The study is not technically sufficient to allow evaluation of LV diastolic function. - Ventricular septum: Septal motion showed paradox. The contour showed diastolic flattening. - Left atrium: The atrium was mildly dilated. - Right ventricle: The cavity size was mildly dilated. Wall thickness was normal. - Tricuspid valve: There was moderate regurgitation. - Pulmonary arteries: Systolic pressure was moderately increased. PA peak pressure: 60 mm Hg (S).  Impressions: - Compared to the prior study, there has been no significant interval change.    Consultations:  Cardiology   Discharge Exam: Vitals:   12/26/16 0534 12/26/16 0841  BP: (!) 151/70 (!) 141/60  Pulse: 81 68  Resp: 18    Temp: (!) 97.5 F (36.4 C)   SpO2:      General: afebrile, no CP, no SOB, no palpitations. Reports very little dizziness when standing for over 3 Min. Mild discomfort on left chest wound where pacemaker was inserted.  Cardiovascular: S1 and S2, no rubs, no gallops, no murmur  Respiratory: good air movement, no wheezing, no crackles  Abd: soft, NT, ND, positive BS Extremities: no edema, no cyanosis, no clubbing  Neuro: non focal deficit, CN intact   Discharge Instructions   Discharge Instructions    (HEART FAILURE PATIENTS) Call MD:  Anytime you have any of the following symptoms: 1) 3 pound weight gain in 24 hours or 5 pounds in 1 week 2) shortness of breath, with or without a dry hacking cough 3) swelling in the hands, feet or stomach 4) if you have to sleep on extra pillows at night in order to breathe.   Complete by:  As directed    Call MD for:  difficulty breathing, headache or visual disturbances   Complete by:  As directed    Call MD for:  persistant dizziness or light-headedness   Complete by:  As directed    Diet - low sodium heart healthy   Complete by:  As directed    Discharge instructions   Complete by:  As directed    Take medications as prescribed  Follow  low sodium diet (less than 2 gram daily) Check your weight on daily basis Arrange follow up with PCP in 2 weeks Follow up with cardiology service as instructed  Maintain adequate hydration Take your time when changing positions (especially from lying/sitting to standing)     Allergies as of 12/26/2016      Reactions   Nitrostat [nitroglycerin] Other (See Comments)   Causes blood pressure to "bottom out"      Medication List    STOP taking these medications   esomeprazole 20 MG capsule Commonly known as:  NEXIUM   losartan 100 MG tablet Commonly known as:  COZAAR     TAKE these medications   acetaminophen 500 MG tablet Commonly known as:  TYLENOL Take 500 mg by mouth every 6 (six) hours as  needed for moderate pain.   allopurinol 300 MG tablet Commonly known as:  ZYLOPRIM Take 300 mg by mouth daily.   amLODipine 2.5 MG tablet Commonly known as:  NORVASC Take 1 tablet (2.5 mg total) by mouth daily. Start taking on:  12/27/2016 What changed:    medication strength  how much to take   atorvastatin 20 MG tablet Commonly known as:  LIPITOR Take 1 tablet (20 mg total) by mouth daily.   fenofibrate micronized 134 MG capsule Commonly known as:  LOFIBRA Take 134 mg by mouth daily before breakfast.   furosemide 40 MG tablet Commonly known as:  LASIX Take 0.5 tablets (20 mg total) by mouth daily. What changed:  how much to take   hydrALAZINE 10 MG tablet Commonly known as:  APRESOLINE Take 1 tablet (10 mg total) by mouth every 8 (eight) hours.   Melatonin 5 MG Tabs Take 5 mg by mouth at bedtime as needed (sleep).   metoprolol succinate 25 MG 24 hr tablet Commonly known as:  TOPROL-XL Take 1 tablet (25 mg total) by mouth daily. Take with or immediately following a meal.   pantoprazole 40 MG tablet Commonly known as:  PROTONIX Take 1 tablet (40 mg total) by mouth daily.   potassium chloride SA 20 MEQ tablet Commonly known as:  K-DUR,KLOR-CON Take 1 tablet (20 mEq total) by mouth daily. Start taking on:  12/27/2016   Rivaroxaban 15 MG Tabs tablet Commonly known as:  XARELTO Take 1 tablet (15 mg total) by mouth daily with supper.   tamsulosin 0.4 MG Caps capsule Commonly known as:  FLOMAX Take 1 capsule (0.4 mg total) by mouth daily.      Allergies  Allergen Reactions  . Nitrostat [Nitroglycerin] Other (See Comments)    Causes blood pressure to "bottom out"   Follow-up Information    [facility] Follow up on 01/08/2017.   Specialty:  Cardiology Why:  2:30PM, wound check visit Contact information: [address] [place]       Deboraha Sprang, MD Follow up on 03/13/2017.    Specialty:  Cardiology Why:  2:15PM Contact information: [address] [address] [phone]        Almyra Deforest, [place] Follow up on 01/10/2017.   Specialties:  Cardiology, Radiology Why:  10:00AM  Contact information: [address] [name] [address] [phone]           The results of significant diagnostics from this hospitalization (including imaging, microbiology, ancillary and laboratory) are listed below for reference.    Significant Diagnostic Studies: Dg Chest 2 View  Result Date:  12/26/2016 CLINICAL DATA:  76 year old male status post pacemaker placement. EXAM: CHEST  2 VIEW COMPARISON:  12/20/2016 and earlier. FINDINGS: New left chest dual lead cardiac pacemaker. No pneumothorax. Stable lung volumes. Leads terminating in the right atrium and RV region. Prior CABG. Stable cardiac size and mediastinal contours. Probable left atrial enlargement. No pulmonary edema, pleural effusion or acute pulmonary opacity. Bulky lower thoracic endplate osteophytes. No acute osseous abnormality identified. Negative visible bowel gas pattern. IMPRESSION: 1. Left chest cardiac pacemaker placed with no adverse features. 2.  No acute cardiopulmonary abnormality. Electronically Signed   By: Genevie Ann M.D.   On: 12/26/2016 09:16   Dg Chest 2 View  Result Date: 12/20/2016 CLINICAL DATA:  Dyspnea on exertion for 4 weeks, significantly worsened today. EXAM: CHEST  2 VIEW COMPARISON:  08/08/2016 FINDINGS: Marked cardiomegaly, unchanged. Slight vascular prominence without frank interstitial edema. No airspace consolidation. No significant effusions. Prior sternotomy and CABG. IMPRESSION: Unchanged cardiomegaly. Worsened vascular fullness, without frank interstitial or alveolar edema. Electronically Signed   By: Andreas Newport M.D.   On: 12/20/2016 18:09    Microbiology: Recent Results (from the past 240 hour(s))  Urine Culture     Status: None    Collection Time: 12/21/16  7:21 AM  Result Value Ref Range Status   Specimen Description URINE, RANDOM  Final   Special Requests NONE  Final   Culture NO GROWTH  Final   Report Status 12/22/2016 FINAL  Final  Surgical PCR screen     Status: None   Collection Time: 12/22/16  6:48 PM  Result Value Ref Range Status   MRSA, PCR NEGATIVE NEGATIVE Final   Staphylococcus aureus NEGATIVE NEGATIVE Final    Comment: (NOTE) The Xpert SA Assay (FDA approved for NASAL specimens in patients 74 years of age and older), is one component of a comprehensive surveillance program. It is not intended to diagnose infection nor to guide or monitor treatment.      Labs: Basic Metabolic Panel: Recent Labs  Lab 12/21/16 0652 12/22/16 0535 12/23/16 0410 12/24/16 0335 12/25/16 0511  NA 143 143 143 139 142  K 3.3* 3.3* 3.1* 3.7 4.3  CL 109 105 103 103 106  CO2 24 28 26 26 27   GLUCOSE 91 93 79 89 94  BUN 25* 24* 27* 30* 34*  CREATININE 1.75* 2.06* 2.12* 2.12* 2.26*  CALCIUM 9.2 9.4 9.3 9.1 9.5   CBC: Recent Labs  Lab 12/20/16 1731 12/21/16 0652  WBC 5.7 5.2  NEUTROABS  --  3.6  HGB 12.2* 11.2*  HCT 38.7* 35.9*  MCV 81.1 82.3  PLT 152 141*   BNP (last 3 results) Recent Labs    08/08/16 2219 12/20/16 1731  BNP 335.1* 735.6*    CBG: Recent Labs  Lab 12/23/16 1649 12/23/16 2140 12/24/16 0717 12/24/16 1129 12/24/16 1640  GLUCAP 115* 106* 94 86 129*    Signed:  Barton Dubois MD.  [facility] 12/26/2016, 11:47 AM

## 2016-12-26 NOTE — Plan of Care (Signed)
  Education: Ability to demonstrate management of disease process will improve 12/26/2016 0202 - Adequate for Discharge by Tristan Schroeder, RN

## 2016-12-26 NOTE — Progress Notes (Signed)
Pt discharge instructions reviewed with pt and wife. Pt and wife verbalize understanding. Pt belongings with pt. Pt is not in distress. Pt discharged via wheelchair.

## 2016-12-29 ENCOUNTER — Encounter: Payer: Self-pay | Admitting: Cardiology

## 2016-12-29 DIAGNOSIS — Z09 Encounter for follow-up examination after completed treatment for conditions other than malignant neoplasm: Secondary | ICD-10-CM | POA: Diagnosis not present

## 2016-12-29 DIAGNOSIS — I10 Essential (primary) hypertension: Secondary | ICD-10-CM | POA: Diagnosis not present

## 2016-12-29 DIAGNOSIS — Z7901 Long term (current) use of anticoagulants: Secondary | ICD-10-CM | POA: Diagnosis not present

## 2016-12-29 LAB — UIFE/LIGHT CHAINS/TP QN, 24-HR UR
% BETA, Urine: 13.4 %
ALPHA 1 URINE: 9.1 %
Albumin, U: 67.1 %
Alpha 2, Urine: 4.2 %
Free Kappa/Lambda Ratio: 7.39 (ref 2.04–10.37)
Free Lambda Lt Chains,Ur: 2.41 mg/L (ref 0.24–6.66)
Free Lt Chn Excr Rate: 17.8 mg/L (ref 1.35–24.19)
GAMMA GLOBULIN URINE: 6.3 %
Total Protein, Urine-Ur/day: 3492 mg/24 hr — ABNORMAL HIGH (ref 30–150)
Total Protein, Urine: 116.4 mg/dL
Total Volume: 3000

## 2017-01-08 ENCOUNTER — Ambulatory Visit (INDEPENDENT_AMBULATORY_CARE_PROVIDER_SITE_OTHER): Payer: Medicare Other | Admitting: *Deleted

## 2017-01-08 ENCOUNTER — Encounter: Payer: Self-pay | Admitting: Internal Medicine

## 2017-01-08 DIAGNOSIS — I441 Atrioventricular block, second degree: Secondary | ICD-10-CM | POA: Diagnosis not present

## 2017-01-08 LAB — CUP PACEART INCLINIC DEVICE CHECK
Battery Remaining Longevity: 80 mo
Battery Voltage: 3.2 V
Brady Statistic AP VP Percent: 45.46 %
Brady Statistic AP VS Percent: 0.12 %
Brady Statistic AS VP Percent: 48.44 %
Brady Statistic AS VS Percent: 5.99 %
Brady Statistic RA Percent Paced: 45.54 %
Brady Statistic RV Percent Paced: 93.9 %
Date Time Interrogation Session: 20181231145820
Implantable Lead Implant Date: 20181217
Implantable Lead Implant Date: 20181217
Implantable Lead Location: 753859
Implantable Lead Location: 753859
Implantable Lead Model: 3830
Implantable Lead Model: 5076
Implantable Pulse Generator Implant Date: 20181217
Lead Channel Impedance Value: 361 Ohm
Lead Channel Impedance Value: 361 Ohm
Lead Channel Impedance Value: 475 Ohm
Lead Channel Impedance Value: 532 Ohm
Lead Channel Pacing Threshold Amplitude: 0.5 V
Lead Channel Pacing Threshold Amplitude: 0.875 V
Lead Channel Pacing Threshold Pulse Width: 0.4 ms
Lead Channel Pacing Threshold Pulse Width: 0.4 ms
Lead Channel Sensing Intrinsic Amplitude: 1.75 mV
Lead Channel Sensing Intrinsic Amplitude: 1.875 mV
Lead Channel Sensing Intrinsic Amplitude: 21.5 mV
Lead Channel Sensing Intrinsic Amplitude: 23.875 mV
Lead Channel Setting Pacing Amplitude: 3.5 V
Lead Channel Setting Pacing Amplitude: 3.5 V
Lead Channel Setting Pacing Pulse Width: 1 ms
Lead Channel Setting Sensing Sensitivity: 1.2 mV

## 2017-01-08 NOTE — Patient Instructions (Signed)
Wound check appointment. Dermabond removed. Wound without redness or edema. Incision edges approximated, wound well healed. Normal device function. Thresholds, sensing, and impedances consistent with implant measurements. Device programmed at 3.5V until 3 month visit. Histogram distribution appropriate for patient and level of activity. No mode switches or high ventricular rates noted. Patient educated about wound care, arm mobility, lifting restrictions. ROV with SK 03/13/17

## 2017-01-10 ENCOUNTER — Encounter: Payer: Self-pay | Admitting: Physician Assistant

## 2017-01-10 ENCOUNTER — Ambulatory Visit: Payer: Medicare Other | Admitting: Physician Assistant

## 2017-01-10 VITALS — BP 136/62 | HR 72 | Ht 66.0 in | Wt 191.0 lb

## 2017-01-10 DIAGNOSIS — E785 Hyperlipidemia, unspecified: Secondary | ICD-10-CM | POA: Diagnosis not present

## 2017-01-10 DIAGNOSIS — Z950 Presence of cardiac pacemaker: Secondary | ICD-10-CM | POA: Diagnosis not present

## 2017-01-10 DIAGNOSIS — I480 Paroxysmal atrial fibrillation: Secondary | ICD-10-CM | POA: Diagnosis not present

## 2017-01-10 DIAGNOSIS — G4733 Obstructive sleep apnea (adult) (pediatric): Secondary | ICD-10-CM

## 2017-01-10 DIAGNOSIS — I25810 Atherosclerosis of coronary artery bypass graft(s) without angina pectoris: Secondary | ICD-10-CM | POA: Diagnosis not present

## 2017-01-10 DIAGNOSIS — Z9989 Dependence on other enabling machines and devices: Secondary | ICD-10-CM

## 2017-01-10 DIAGNOSIS — I10 Essential (primary) hypertension: Secondary | ICD-10-CM

## 2017-01-10 MED ORDER — AMLODIPINE BESYLATE 5 MG PO TABS: 5.0000 mg | ORAL_TABLET | Freq: Every day | ORAL | 5 refills | Status: DC

## 2017-01-10 NOTE — Progress Notes (Signed)
Cardiology Office Note    Date:  01/12/2017   ID:  [name], DOB July 03, 1940, MRN [id number]  PCP:  [name], MD  Cardiologist:  Dr. Claiborne Billings Electrophysiologist: Dr. Caryl Comes  Chief Complaint  Patient presents with  . Follow-up    3-4 months. Seen for Dr. Claiborne Billings  . Edema    Ankles a little.    History of Present Illness:  Paul Price is a 77 y.o. male with PMH of CAD s/p CABG 1992 (LIMA to LAD, SVG to diag), OSA on CPAP, HTN, DVT 2015, HLD, RBBB and PAF on Xarelto.  He had a cardiac catheterization in 2009 that showed atretic LIMA, with patent SVG.  He had a stent to LAD in 2012, Myoview in 2013 showed no ischemia.  Due to acute on chronic renal insufficiency, he is off spironolactone and ACE/R.  He had a GI bleed on anticoagulation.  Last echocardiogram in August 2018 showed EF 45-50%.  He was admitted for rapid atrial fibrillation in July 2018 and was started on Xarelto.  He was recently seen by Rosaria Ferries on 12/04/2016 for bradycardia.  Sometimes his heart rate is in the 30s.  His beta-blocker was cut back to 25 mg daily Toprol-XL.  He was recently admitted in December 2018 for dyspnea on exertion accompanied with lightheadedness.  He was noted to have Mobitz type I heart block during the hospitalization, beta-blocker discontinued due to suspicion of sick sinus syndrome.  He eventually underwent pacemaker placement by Dr. Caryl Comes on 12/25/2016.  Patient presents today for cardiology office visit.  He denies any shortness of breath or chest discomfort.  Since his discharge, he has obtained lab work at that Dr. Pennie Banter office, his renal function has been improving.  His last interrogation on 01/08/2017 showed normal device function.  Otherwise he has not had any further heart failure symptoms.  His breathing is much better.  He appears to be euvolemic on physical exam, I will continue on the current dose of Lasix and potassium.  Last potassium obtained on 12/29/2016 was 4.5.  He can  follow-up with Dr. Claiborne Billings in 23-month.  Otherwise he has no lower extremity edema, orthopnea or PND.   Past Medical History:  Diagnosis Date  . Anemia   . Arthritis    "shoulders" (09/07/2016)  . Atrial fibrillation ([place])   . BPH (benign prostatic hyperplasia)   . Chronic kidney disease (CKD), stage III (moderate) (HCC)   . Coronary artery disease   . DDD (degenerative disc disease), cervical   . DVT (deep venous thrombosis) ([place]) 12/2013   LLE  . GERD (gastroesophageal reflux disease)   . Gout   . High cholesterol   . History of blood transfusion 09/06/2016  . History of scarlet fever 1940s  . History of stress test 06/2011   No significant ischemia, this is a low risk scan. Clinical correlation recommended Abnormal myocardial perfusion study.  Marland Kitchen Hx of echocardiogram 05/2009   EF 40-45%, he did have mild annular calcification with mild-to-moderate MR and mild TR as well as aortic valve sclerosis. Estimated RV systolic pressure was 21 mm.  . Hypertension   . Kidney failure   . 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 Neck pain    "not chronic" (09/07/2016)  . Obesity   . OSA on CPAP   . RBBB   . Renal insufficiency   . Spinal stenosis of lumbar region     Past Surgical History:  Procedure Laterality Date  .  APPENDECTOMY    . CARDIAC CATHETERIZATION  2009   he was found to have a atretic LIMA graft to his LAD and had a patent vein graft supplying his diagonal vessel. At that time, he had 70% narrowing in his LAD beyond a diagonal vessel which was initially treated medically.  . COLONOSCOPY WITH PROPOFOL Left 09/09/2016   Procedure: COLONOSCOPY WITH PROPOFOL;  Surgeon: Arta Silence, MD;  Location: [facility];  Service: Endoscopy;  Laterality: Left;  . CORONARY ANGIOPLASTY WITH STENT PLACEMENT  April 2012   LAD DES  . CORONARY ARTERY BYPASS GRAFT  1992   with LIMA to the LAD and diagonal.  . ESOPHAGOGASTRODUODENOSCOPY (EGD) WITH PROPOFOL Left 09/08/2016   Procedure:  ESOPHAGOGASTRODUODENOSCOPY (EGD) WITH PROPOFOL;  Surgeon: Ronnette Juniper, MD;  Location: [facility];  Service: Gastroenterology;  Laterality: Left;  . INGUINAL HERNIA REPAIR     "don't remeimber which side"  . PACEMAKER IMPLANT N/A 12/25/2016   Procedure: PACEMAKER IMPLANT;  Surgeon: Deboraha Sprang, MD;  Location: [facility];  Service: Cardiovascular;  Laterality: N/A;  . TONSILLECTOMY      Current Medications: Outpatient Medications Prior to Visit  Medication Sig Dispense Refill  . acetaminophen (TYLENOL) 500 MG tablet Take 500 mg by mouth every 6 (six) hours as needed for moderate pain.    Marland Kitchen allopurinol (ZYLOPRIM) 300 MG tablet Take 300 mg by mouth daily.    . fenofibrate micronized (LOFIBRA) 134 MG capsule Take 134 mg by mouth daily before breakfast.    . furosemide (LASIX) 40 MG tablet Take 0.5 tablets (20 mg total) by mouth daily.    . Melatonin 5 MG TABS Take 5 mg by mouth at bedtime as needed (sleep).     . metoprolol succinate (TOPROL-XL) 25 MG 24 hr tablet Take 1 tablet (25 mg total) by mouth daily. Take with or immediately following a meal. 30 tablet 5  . pantoprazole (PROTONIX) 40 MG tablet Take 1 tablet (40 mg total) by mouth daily. 30 tablet 0  . potassium chloride SA (K-DUR,KLOR-CON) 20 MEQ tablet Take 1 tablet (20 mEq total) by mouth daily.    . Rivaroxaban (XARELTO) 15 MG TABS tablet Take 1 tablet (15 mg total) by mouth daily with supper. 30 tablet 1  . Tamsulosin HCl (FLOMAX) 0.4 MG CAPS Take 1 capsule (0.4 mg total) by mouth daily. 30 capsule 0  . amLODipine (NORVASC) 2.5 MG tablet Take 1 tablet (2.5 mg total) by mouth daily. 30 tablet 1  . hydrALAZINE (APRESOLINE) 10 MG tablet Take 1 tablet (10 mg total) by mouth every 8 (eight) hours. 90 tablet 1  . atorvastatin (LIPITOR) 20 MG tablet Take 1 tablet (20 mg total) by mouth daily. 90 tablet 3   No facility-administered medications prior to visit.      Allergies:   Nitrostat [nitroglycerin]   Social History    Socioeconomic History  . Marital status: Married    Spouse name: None  . Number of children: None  . Years of education: None  . Highest education level: None  Social Needs  . Financial resource strain: None  . Food insecurity - worry: None  . Food insecurity - inability: None  . Transportation needs - medical: None  . Transportation needs - non-medical: None  Occupational History  . None  Tobacco Use  . Smoking status: Former Smoker    Packs/day: 1.00    Years: 30.00    Pack years: 30.00    Types: Cigarettes  . Smokeless tobacco:  Never Used  . Tobacco comment: quit ~ 1985  Substance and Sexual Activity  . Alcohol use: No    Alcohol/week: 0.0 oz  . Drug use: No  . Sexual activity: None  Other Topics Concern  . None  Social History Narrative  . None     Family History:  The patient's family history includes Heart disease in his father.   ROS:   Please see the history of present illness.    ROS All other systems reviewed and are negative.   PHYSICAL EXAM:   VS:  BP 136/62   Pulse 72   Ht 5\' 6"  (1.676 m)   Wt 191 lb (86.6 kg)   BMI 30.83 kg/m    GEN: Well nourished, well developed, in no acute distress  HEENT: normal  Neck: no JVD, carotid bruits, or masses Cardiac: RRR; no murmurs, rubs, or gallops,no edema  Respiratory:  clear to auscultation bilaterally, normal work of breathing GI: soft, nontender, nondistended, + BS MS: no deformity or atrophy  Skin: warm and dry, no rash Neuro:  Alert and Oriented x 3, Strength and sensation are intact Psych: euthymic mood, full affect  Wt Readings from Last 3 Encounters:  01/10/17 191 lb (86.6 kg)  12/26/16 182 lb (82.6 kg)  12/04/16 203 lb (92.1 kg)      Studies/Labs Reviewed:   EKG:  EKG is not ordered today.    Recent Labs: 09/07/2016: ALT 16 12/20/2016: B Natriuretic Peptide 735.6 12/21/2016: Hemoglobin 11.2; Platelets 141; TSH 3.605 12/25/2016: BUN 34; Creatinine, Ser 2.26; Potassium 4.3; Sodium 142    Lipid Panel    Component Value Date/Time   CHOL 104 12/22/2016 0535   TRIG 91 12/22/2016 0535   HDL 29 (L) 12/22/2016 0535   CHOLHDL 3.6 12/22/2016 0535   VLDL 18 12/22/2016 0535   LDLCALC 57 12/22/2016 0535    Additional studies/ records that were reviewed today include:   Echo 12/21/2016 LV EF: 40% -   45%  ------------------------------------------------------------------- History:   PMH:  Cardiomegaly 429.3.  Atrial fibrillation. Coronary artery disease.  Risk factors:  Former tobacco use. Hypertension. Dyslipidemia.  ------------------------------------------------------------------- Study Conclusions  - Left ventricle: The cavity size was normal. Wall thickness was   increased in a pattern of moderate LVH. Systolic function was   mildly to moderately reduced. The estimated ejection fraction was   in the range of 40% to 45%. Diffuse hypokinesis. The study is not   technically sufficient to allow evaluation of LV diastolic   function. - Ventricular septum: Septal motion showed paradox. The contour   showed diastolic flattening. - Left atrium: The atrium was mildly dilated. - Right ventricle: The cavity size was mildly dilated. Wall   thickness was normal. - Tricuspid valve: There was moderate regurgitation. - Pulmonary arteries: Systolic pressure was moderately increased.   PA peak pressure: 60 mm Hg (S).  Impressions:  - Compared to the prior study, there has been no significant   interval change.    ASSESSMENT:    1. Coronary artery disease involving coronary bypass graft of native heart without angina pectoris   2. OSA on CPAP   3. Essential hypertension   4. Hyperlipidemia, unspecified hyperlipidemia type   5. Pacemaker   6. PAF (paroxysmal atrial fibrillation) (HCC)      PLAN:  In order of problems listed above:  1. CAD s/p CABG: Denies any chest pain, not on aspirin due to the need for Xarelto  2. PAF: On Xarelto, maintaining sinus  rhythm and rate control therapy  3. Hypertension: Blood pressure very well controlled  4. Hyperlipidemia: On fenofibrate and Lipitor  5. Sick sinus syndrome s/p pacemaker: Followed by Dr. Caryl Comes, last device interrogation January 08, 2017 which showed normal device function.  42-month follow-up was recommended.  6. OSA on CPAP: Continue CPAP    Medication Adjustments/Labs and Tests Ordered: Current medicines are reviewed at length with the patient today.  Concerns regarding medicines are outlined above.  Medication changes, Labs and Tests ordered today are listed in the Patient Instructions below. Patient Instructions  Medication Instructions:   INCREASE Amlodipine to 5mg  DAILY  DISCONTINUE Hydralazine  Labwork:   none  Testing/Procedures:  none  Follow-Up:  With Dr. Caryl Comes as scheduled, and with Dr. Claiborne Billings in about 5 months  If you need a refill on your cardiac medications before your next appointment, please call your pharmacy.    Hilbert Corrigan, [place]  01/12/2017 1:01 PM    [facility] [address] [address], [place], [place]  [address] Phone: [phone]; Fax: [phone]

## 2017-01-10 NOTE — Patient Instructions (Signed)
Medication Instructions:   INCREASE Amlodipine to 5mg  DAILY  DISCONTINUE Hydralazine  Labwork:   none  Testing/Procedures:  none  Follow-Up:  With Dr. Caryl Comes as scheduled, and with Dr. Claiborne Billings in about 5 months  If you need a refill on your cardiac medications before your next appointment, please call your pharmacy.

## 2017-01-12 ENCOUNTER — Encounter: Payer: Self-pay | Admitting: Physician Assistant

## 2017-01-12 DIAGNOSIS — I2510 Atherosclerotic heart disease of native coronary artery without angina pectoris: Secondary | ICD-10-CM | POA: Diagnosis not present

## 2017-01-12 DIAGNOSIS — I509 Heart failure, unspecified: Secondary | ICD-10-CM | POA: Diagnosis not present

## 2017-01-12 DIAGNOSIS — Z7901 Long term (current) use of anticoagulants: Secondary | ICD-10-CM | POA: Diagnosis not present

## 2017-01-24 DIAGNOSIS — N179 Acute kidney failure, unspecified: Secondary | ICD-10-CM | POA: Diagnosis not present

## 2017-01-24 DIAGNOSIS — I129 Hypertensive chronic kidney disease with stage 1 through stage 4 chronic kidney disease, or unspecified chronic kidney disease: Secondary | ICD-10-CM | POA: Diagnosis not present

## 2017-01-24 DIAGNOSIS — N183 Chronic kidney disease, stage 3 (moderate): Secondary | ICD-10-CM | POA: Diagnosis not present

## 2017-01-24 DIAGNOSIS — N2581 Secondary hyperparathyroidism of renal origin: Secondary | ICD-10-CM | POA: Diagnosis not present

## 2017-01-24 DIAGNOSIS — D631 Anemia in chronic kidney disease: Secondary | ICD-10-CM | POA: Diagnosis not present

## 2017-02-09 DIAGNOSIS — I129 Hypertensive chronic kidney disease with stage 1 through stage 4 chronic kidney disease, or unspecified chronic kidney disease: Secondary | ICD-10-CM | POA: Diagnosis not present

## 2017-02-09 DIAGNOSIS — Z7689 Persons encountering health services in other specified circumstances: Secondary | ICD-10-CM | POA: Diagnosis not present

## 2017-03-13 ENCOUNTER — Ambulatory Visit: Payer: Medicare Other | Admitting: Internal Medicine

## 2017-03-13 ENCOUNTER — Encounter: Payer: Self-pay | Admitting: Internal Medicine

## 2017-03-13 VITALS — BP 142/68 | HR 67 | Ht 66.0 in | Wt 195.0 lb

## 2017-03-13 DIAGNOSIS — R001 Bradycardia, unspecified: Secondary | ICD-10-CM | POA: Diagnosis not present

## 2017-03-13 DIAGNOSIS — I441 Atrioventricular block, second degree: Secondary | ICD-10-CM | POA: Diagnosis not present

## 2017-03-13 DIAGNOSIS — Z950 Presence of cardiac pacemaker: Secondary | ICD-10-CM

## 2017-03-13 DIAGNOSIS — I25810 Atherosclerosis of coronary artery bypass graft(s) without angina pectoris: Secondary | ICD-10-CM | POA: Diagnosis not present

## 2017-03-13 LAB — CUP PACEART INCLINIC DEVICE CHECK
Battery Remaining Longevity: 106 mo
Battery Voltage: 3.14 V
Brady Statistic AP VP Percent: 37.39 %
Brady Statistic AP VS Percent: 0.04 %
Brady Statistic AS VP Percent: 62.09 %
Brady Statistic AS VS Percent: 0.48 %
Brady Statistic RA Percent Paced: 37.54 %
Brady Statistic RV Percent Paced: 99.49 %
Date Time Interrogation Session: 20190305162352
Implantable Lead Implant Date: 20181217
Implantable Lead Implant Date: 20181217
Implantable Lead Location: 753859
Implantable Lead Location: 753859
Implantable Lead Model: 3830
Implantable Lead Model: 5076
Implantable Pulse Generator Implant Date: 20181217
Lead Channel Impedance Value: 323 Ohm
Lead Channel Impedance Value: 342 Ohm
Lead Channel Impedance Value: 456 Ohm
Lead Channel Impedance Value: 513 Ohm
Lead Channel Pacing Threshold Amplitude: 0.5 V
Lead Channel Pacing Threshold Amplitude: 1 V
Lead Channel Pacing Threshold Pulse Width: 0.4 ms
Lead Channel Pacing Threshold Pulse Width: 1 ms
Lead Channel Sensing Intrinsic Amplitude: 1.625 mV
Lead Channel Sensing Intrinsic Amplitude: 20.875 mV
Lead Channel Setting Pacing Amplitude: 2 V
Lead Channel Setting Pacing Amplitude: 2.5 V
Lead Channel Setting Pacing Pulse Width: 1 ms
Lead Channel Setting Sensing Sensitivity: 1.2 mV

## 2017-03-13 NOTE — Patient Instructions (Addendum)
Medication Instructions:  Your physician recommends that you continue on your current medications as directed. Please refer to the Current Medication list given to you today.  Labwork: None ordered.  Testing/Procedures: None ordered.  Follow-Up: Your physician recommends that you schedule a follow-up appointment in: 9 months with Dr Caryl Comes.  Remote monitoring is used to monitor your Pacemaker from home. This monitoring reduces the number of office visits required to check your device to one time per year. It allows [place] to keep an eye on the functioning of your device to ensure it is working properly. You are scheduled for a device check from home on 03/26/2017. You may send your transmission at any time that day. If you have a wireless device, the transmission will be sent automatically. After your physician reviews your transmission, you will receive a postcard with your next transmission date.   Any Other Special Instructions Will Be Listed Below (If Applicable).     If you need a refill on your cardiac medications before your next appointment, please call your pharmacy.

## 2017-03-13 NOTE — Progress Notes (Signed)
Patient Care Team: [name], MD as PCP - General (Internal Medicine) Troy Sine, MD as PCP - Cardiology (Cardiology)   HPI  Paul Price is a 77 y.o. 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  He is much improved since pacemaker implantation.  He has had no recurrent syncope or presyncope.  He has a history of ischemic heart disease. He is status post bypass surgery.  No chest pain or shortness of breath.  He has had no edema.  He has paroxysmal atrial fibrillation and is anticoagulated with Xarelto.  He is appropriately dosed given his renal insufficiency.  DATE TEST EF   4/18 MYOVIEW  47 % Small scar w/o ischemia   12/18 Echo   40-45% % LVH mod          Date Cr Hgb  12/18 2.26 11.2          Records and Results Reviewed   Past Medical History:  Diagnosis Date  . Anemia   . Arthritis    "shoulders" (09/07/2016)  . Atrial fibrillation ([place])   . BPH (benign prostatic hyperplasia)   . Chronic kidney disease (CKD), stage III (moderate) (HCC)   . Coronary artery disease   . DDD (degenerative disc disease), cervical   . DVT (deep venous thrombosis) ([place]) 12/2013   LLE  . GERD (gastroesophageal reflux disease)   . Gout   . High cholesterol   . History of blood transfusion 09/06/2016  . History of scarlet fever 1940s  . History of stress test 06/2011   No significant ischemia, this is a low risk scan. Clinical correlation recommended Abnormal myocardial perfusion study.  Marland Kitchen Hx of echocardiogram 05/2009   EF 40-45%, he did have mild annular calcification with mild-to-moderate MR and mild TR as well as aortic valve sclerosis. Estimated RV systolic pressure was 21 mm.  . Hypertension   . Kidney failure   . 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 Neck pain    "not chronic" (09/07/2016)  . Obesity   . OSA on CPAP   . RBBB   . Renal insufficiency   . Spinal stenosis of lumbar region     Past  Surgical History:  Procedure Laterality Date  . APPENDECTOMY    . CARDIAC CATHETERIZATION  2009   he was found to have a atretic LIMA graft to his LAD and had a patent vein graft supplying his diagonal vessel. At that time, he had 70% narrowing in his LAD beyond a diagonal vessel which was initially treated medically.  . COLONOSCOPY WITH PROPOFOL Left 09/09/2016   Procedure: COLONOSCOPY WITH PROPOFOL;  Surgeon: Arta Silence, MD;  Location: [facility];  Service: Endoscopy;  Laterality: Left;  . CORONARY ANGIOPLASTY WITH STENT PLACEMENT  April 2012   LAD DES  . CORONARY ARTERY BYPASS GRAFT  1992   with LIMA to the LAD and diagonal.  . ESOPHAGOGASTRODUODENOSCOPY (EGD) WITH PROPOFOL Left 09/08/2016   Procedure: ESOPHAGOGASTRODUODENOSCOPY (EGD) WITH PROPOFOL;  Surgeon: Ronnette Juniper, MD;  Location: [facility];  Service: Gastroenterology;  Laterality: Left;  . INGUINAL HERNIA REPAIR     "don't remeimber which side"  . PACEMAKER IMPLANT N/A 12/25/2016   Procedure: PACEMAKER IMPLANT;  Surgeon: Deboraha Sprang, MD;  Location: [facility];  Service: Cardiovascular;  Laterality: N/A;  . TONSILLECTOMY      Current Outpatient Medications  Medication Sig Dispense Refill  . acetaminophen (TYLENOL) 500  MG tablet Take 500 mg by mouth every 6 (six) hours as needed for moderate pain.    Marland Kitchen allopurinol (ZYLOPRIM) 300 MG tablet Take 300 mg by mouth daily.    Marland Kitchen amLODipine (NORVASC) 5 MG tablet Take 1 tablet (5 mg total) by mouth daily. 30 tablet 5  . atorvastatin (LIPITOR) 20 MG tablet Take 1 tablet (20 mg total) by mouth daily. 90 tablet 3  . fenofibrate micronized (LOFIBRA) 134 MG capsule Take 134 mg by mouth daily before breakfast.    . furosemide (LASIX) 40 MG tablet Take 0.5 tablets (20 mg total) by mouth daily.    Marland Kitchen losartan (COZAAR) 50 MG tablet Take 1 tablet by mouth daily.    . metoprolol succinate (TOPROL-XL) 25 MG 24 hr tablet Take 1 tablet (25 mg total) by mouth daily. Take with or  immediately following a meal. 30 tablet 5  . pantoprazole (PROTONIX) 40 MG tablet Take 1 tablet (40 mg total) by mouth daily. 30 tablet 0  . Rivaroxaban (XARELTO) 15 MG TABS tablet Take 1 tablet (15 mg total) by mouth daily with supper. 30 tablet 1  . Tamsulosin HCl (FLOMAX) 0.4 MG CAPS Take 1 capsule (0.4 mg total) by mouth daily. 30 capsule 0   No current facility-administered medications for this visit.     Allergies  Allergen Reactions  . Nitrostat [Nitroglycerin] Other (See Comments)    Causes blood pressure to "bottom out"      Review of Systems negative except from HPI and PMH  Physical Exam BP (!) 142/68   Pulse 67   Ht 5\' 6"  (1.676 m)   Wt 195 lb (88.5 kg)   SpO2 97%   BMI 31.47 kg/m  Well developed and nourished in no acute distress HENT normal Neck supple with JVP-flat Clear Device pocket well healed; without hematoma or erythema.  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    Chest x-ray was reviewed and demonstrated mildly  Apical lysed septal location  Creatinine is followed by Dr. Servando Salina  No bleeding    We spent more than 50% of our >25 min visit in face to face counseling regarding the above    Current medicines are reviewed at length with the patient today .  The patient does not  have  concerns regarding medicines.

## 2017-03-23 DIAGNOSIS — N184 Chronic kidney disease, stage 4 (severe): Secondary | ICD-10-CM | POA: Diagnosis not present

## 2017-05-21 DIAGNOSIS — Z7689 Persons encountering health services in other specified circumstances: Secondary | ICD-10-CM | POA: Diagnosis not present

## 2017-06-06 ENCOUNTER — Other Ambulatory Visit: Payer: Self-pay | Admitting: Physician Assistant

## 2017-06-06 NOTE — Telephone Encounter (Signed)
Rx sent to pharmacy   

## 2017-06-12 ENCOUNTER — Telehealth: Payer: Self-pay | Admitting: Cardiology

## 2017-06-12 ENCOUNTER — Ambulatory Visit (INDEPENDENT_AMBULATORY_CARE_PROVIDER_SITE_OTHER): Payer: Medicare Other | Admitting: *Deleted

## 2017-06-12 DIAGNOSIS — I441 Atrioventricular block, second degree: Secondary | ICD-10-CM

## 2017-06-12 NOTE — Telephone Encounter (Signed)
LMOVM reminding pt to send remote transmission.   

## 2017-06-13 ENCOUNTER — Other Ambulatory Visit: Payer: Self-pay | Admitting: *Deleted

## 2017-06-13 ENCOUNTER — Encounter: Payer: Self-pay | Admitting: Cardiology

## 2017-06-13 MED ORDER — METOPROLOL SUCCINATE ER 25 MG PO TB24: 25.0000 mg | ORAL_TABLET | Freq: Every day | ORAL | 3 refills | Status: DC

## 2017-06-13 NOTE — Progress Notes (Signed)
Remote pacemaker transmission.   

## 2017-06-15 ENCOUNTER — Telehealth: Payer: Self-pay | Admitting: *Deleted

## 2017-06-15 NOTE — Telephone Encounter (Signed)
Called and scheduled appt with Dr. Claiborne Billings 6/20 at [facility]

## 2017-06-25 ENCOUNTER — Other Ambulatory Visit: Payer: Self-pay | Admitting: Physician Assistant

## 2017-06-28 ENCOUNTER — Ambulatory Visit: Payer: Medicare Other | Admitting: Cardiovascular Disease

## 2017-06-28 ENCOUNTER — Encounter: Payer: Self-pay | Admitting: Cardiovascular Disease

## 2017-06-28 VITALS — BP 148/62 | HR 60 | Ht 66.0 in | Wt 199.8 lb

## 2017-06-28 DIAGNOSIS — Z950 Presence of cardiac pacemaker: Secondary | ICD-10-CM

## 2017-06-28 DIAGNOSIS — G4733 Obstructive sleep apnea (adult) (pediatric): Secondary | ICD-10-CM

## 2017-06-28 DIAGNOSIS — Z9989 Dependence on other enabling machines and devices: Secondary | ICD-10-CM | POA: Diagnosis not present

## 2017-06-28 DIAGNOSIS — I10 Essential (primary) hypertension: Secondary | ICD-10-CM | POA: Diagnosis not present

## 2017-06-28 DIAGNOSIS — Z7901 Long term (current) use of anticoagulants: Secondary | ICD-10-CM

## 2017-06-28 DIAGNOSIS — R600 Localized edema: Secondary | ICD-10-CM

## 2017-06-28 DIAGNOSIS — N184 Chronic kidney disease, stage 4 (severe): Secondary | ICD-10-CM | POA: Diagnosis not present

## 2017-06-28 DIAGNOSIS — I480 Paroxysmal atrial fibrillation: Secondary | ICD-10-CM

## 2017-06-28 MED ORDER — AMLODIPINE BESYLATE 5 MG PO TABS: 7.5000 mg | ORAL_TABLET | Freq: Every day | ORAL | 8 refills | Status: DC

## 2017-06-28 NOTE — Progress Notes (Signed)
Patient ID: Paul Price, male   DOB: [birth year], 77 y.o.   MRN: [id number]     Primary MD:  Dr. [name]  HPI: Paul Price is a 77 y.o. male who presents for a 54 month cardiology followup evaluation   Mr. Welty has established CAD and in 1992 underwent CABG revascularization surgery LIMA to the LAD and diagonal. In 2009 he was found to have an atretic LIMA graft to the LAD and a patent vein graft supplying the diagonal vessel. He had 70% narrowing in the LAD beyond the diagonal was treated medically. In April 2012 due to progressive chest pain and scintigraphic evidence for ischemia in the mid distal LAD territory repeat catheterization was performed and he underwent successful stenting to his LAD beyond the diagonal with a 3.0x20 mm Promus DES stent postdilated 3.25 mm. Subsequent, he has remained active and has done well. His last stress test in 2013 showed an apical defect without ischemia.  Additional problems include obstructive sleep apnea on CPAP therapy, moderate obesity, hypertension, history of DVT, and hyperlipidemia.  He sees Dr. Shelia Media for primary care.  On November 19, 2012 he saw Paul Price.PAC for suspected atrial fibrillation and CHF after having been seen by his primary physician and noted some increasing shortness of breath with activity. A CardioNet monitor  demonstrated  a 4.3 second pause which led to a reduction in his  beta blocker dose  from Lopressor 75 mg twice a day to 25 twice a day. He saw Tarri Fuller on 12/02/2012 at which time he was in sinus rhythm but also was found to have probably 9 beat run of nonsustained VT on monitor which is he was maintained on low-dose beta blocker therapy. Subsequent cardiac monitoring did not show any further episodes of significant bradycardia but he did have one additional 9 beat run of nonsustained PSVT at a rate of 109 beats per minute on 12/17/12. . Laboratory done by Dr. Shelia Media on 11/27/2012 showed a potassium of 4.2. His  creatinine was 1.82. BUN 33.  An echo Doppler study done 12/09/2012 showed an estimated ejection fraction at 50-55%. Although no diagnostic regional wall motion abnormalities were definitively identified, this possibility was not completely excluded. There was grade 2 diastolic dysfunction. There is mild left atrial dilatation and mild mitral regurgitation.  A renal ultrasound has  demonstrate bilateral small renal cysts.    In 2015 he was found to have a DVT in his left leg and was started on eliquis by Dr. Shelia Media.  He was hospitalized on November 23 through 12/09/2013 and presented with fevers, and sudden onset of severe back pain.  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  Blood cultures were negative.   After 1 week in the hospital, he spent 3 weeks at rehabilitation.  He was treated with 4 weeks of antibiotic therapy and after his hospitalization.  He was followed by Dr. [name] from infectious disease.  He last saw him in March 2016 and att that time he was felt to be completely cleared cured.    He has renal insufficiency and has been followed by Dr. Gillermina Price since he had developed acute on chronic kidney injury.  His serum creatinine peaked at 2.8.  Ultimately, this has improved.    When I  saw him in follow-up, he continued to note lower extremity edema. He was no longer anticoagulation but has been on Plavix and aspirin.  I scheduled him for follow-up lower extremity venous ultrasound  which showed resolution of his left distal femoral vein and popliteal vein thrombus . His blood pressure has been treated with Toprol-XL 50 mg losartan 100 mg furosemide 40 mg and amlodipine 10 mg.  He is on lipid lowering therapy with atorvastatin.  He takes allopurinol for gout.  There is history of GERD treated with ranitidine.    He admits to using his CPAP with 100% compliance.  He states he has a new machine.  However, recently he has been noticing frequent awakenings.  He denies  awareness of breakthrough snoring.    When I last saw him, he noticed a change in symptomatology with more progressive exertional dyspnea.  He denied any exertional chest pressure.  However, he has noticed some nonexertional constant chest wall ache.  His shortness of breath was occurring with less activity.  He is unaware of palpitations.  He denies presyncope or syncope.  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   An echo Doppler study on 04/28/2016 showed an EF of 50-55%.  There was grade 2 diastolic dysfunction.  Septal motion.  She showed paradoxical motion.  The left atrium was severely dilated.  There was mild MR.  The RV was mildly dilated, as was the right atrium.  There was mild palmar hypertension with PA pressure 33.  He underwent a nuclear perfusion study to further evaluate his symptoms.  This remained low risk.  Although calculated at 47% EF, visually it appeared to be 55%.  There was a small nonreversible defect in the mid anterior and apical location without ischemia.  He also underwent lower extremity arterial Doppler studies which showed normal ABIs bilaterally and there was no evidence for segmental lower extremity arterial disease. Creatinine was 1.76 on 04/14/2016; Hemoglobin A1c was increased at 6.7.  Lipid studies revealed a TC 128, triglyceride 152, LDL 66, and he has a low HDL level of 32.    He was hospitalized on July 31 through 08/10/2016 and was found to have atrial fibrillation with rapid ventricular response as well as acute renal failure.  His creatinine had risen to 2.9 and improved to 1.9.  With gentle hydration.  His losartan and spironolactone were held.  He was started on Xarelto at discharge.  On August 29 through 09/09/2016.  He was rehospitalized with GI bleed and acute blood loss anemia.  He underwent endoscopy and colonoscopy and GI recommended continuation of anticoagulation with  concomitant Protonix therapy.  He tells me he received 3 units of blood.  During his initial hospitalization on August 1.  An echo Doppler study showed an EF of 45-50% with diffuse hypocontractility.  There was aortic sclerosis without stenosis, mitral annular calcification, and mild LA dilation.  He can 10.  Used to use CPAP.   Since I last saw him in September 2018, he was hospitalized in December 2018 for dyspnea on exertion accompanied with lightheadedness.  He was noted to have Mobitz type I heart block during his hospitalization and beta-blocker was discontinued due to suspicion of sick sinus syndrome.  He was ultimately evaluated by Dr. Caryl Comes and underwent pacemaker implantation on December 18.  Subsequently, he has not had any recurrent syncope or presyncope.  A pacemaker device check in March 2019 showed normal device function.  Since his pacemaker insertion he denies any recurrent episodes of dizziness.  His weakness has resolved and shortness of breath is improved.  He continues to use CPAP with 100% compliance.  He is unaware of any atrial fibrillation.  He admits to occasional leg swelling.  He denies any chest pain.  He presents for follow-up evaluation.    Past Medical History:  Diagnosis Date  . Anemia   . Arthritis    "shoulders" (09/07/2016)  . Atrial fibrillation ([place])   . BPH (benign prostatic hyperplasia)   . Chronic kidney disease (CKD), stage III (moderate) (HCC)   . Coronary artery disease   . DDD (degenerative disc disease), cervical   . DVT (deep venous thrombosis) ([place]) 12/2013   LLE  . GERD (gastroesophageal reflux disease)   . Gout   . High cholesterol   . History of blood transfusion 09/06/2016  . History of scarlet fever 1940s  . History of stress test 06/2011   No significant ischemia, this is a low risk scan. Clinical correlation recommended Abnormal myocardial perfusion study.  Marland Kitchen Hx of echocardiogram 05/2009   EF 40-45%, he did have mild annular  calcification with mild-to-moderate MR and mild TR as well as aortic valve sclerosis. Estimated RV systolic pressure was 21 mm.  . Hypertension   . Kidney failure   . 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 Neck pain    "not chronic" (09/07/2016)  . Obesity   . OSA on CPAP   . RBBB   . Renal insufficiency   . Spinal stenosis of lumbar region     Past Surgical History:  Procedure Laterality Date  . APPENDECTOMY    . CARDIAC CATHETERIZATION  2009   he was found to have a atretic LIMA graft to his LAD and had a patent vein graft supplying his diagonal vessel. At that time, he had 70% narrowing in his LAD beyond a diagonal vessel which was initially treated medically.  . COLONOSCOPY WITH PROPOFOL Left 09/09/2016   Procedure: COLONOSCOPY WITH PROPOFOL;  Surgeon: Arta Silence, MD;  Location: [facility];  Service: Endoscopy;  Laterality: Left;  . CORONARY ANGIOPLASTY WITH STENT PLACEMENT  April 2012   LAD DES  . CORONARY ARTERY BYPASS GRAFT  1992   with LIMA to the LAD and diagonal.  . ESOPHAGOGASTRODUODENOSCOPY (EGD) WITH PROPOFOL Left 09/08/2016   Procedure: ESOPHAGOGASTRODUODENOSCOPY (EGD) WITH PROPOFOL;  Surgeon: Ronnette Juniper, MD;  Location: [facility];  Service: Gastroenterology;  Laterality: Left;  . INGUINAL HERNIA REPAIR     "don't remeimber which side"  . PACEMAKER IMPLANT N/A 12/25/2016   Procedure: PACEMAKER IMPLANT;  Surgeon: Deboraha Sprang, MD;  Location: [facility];  Service: Cardiovascular;  Laterality: N/A;  . TONSILLECTOMY      Allergies  Allergen Reactions  . Nitrostat [Nitroglycerin] Other (See Comments)    Causes blood pressure to "bottom out"    Current Outpatient Medications  Medication Sig Dispense Refill  . acetaminophen (TYLENOL) 500 MG tablet Take 500 mg by mouth every 6 (six) hours as needed for moderate pain.    Marland Kitchen allopurinol (ZYLOPRIM) 300 MG tablet Take 300 mg by mouth daily.    Marland Kitchen amLODipine (NORVASC) 5 MG tablet Take 1.5 tablets (7.5  mg total) by mouth daily. 30 tablet 8  . atorvastatin (LIPITOR) 20 MG tablet Take 20 mg by mouth daily.    . fenofibrate micronized (LOFIBRA) 134 MG capsule Take 134 mg by mouth daily before breakfast.    . furosemide (LASIX) 40 MG tablet Take 0.5 tablets (20 mg total) by mouth daily.    Marland Kitchen losartan (COZAAR) 50 MG  tablet Take 1 tablet by mouth daily.    . metoprolol succinate (TOPROL-XL) 25 MG 24 hr tablet Take 1 tablet (25 mg total) by mouth daily. 90 tablet 3  . pantoprazole (PROTONIX) 40 MG tablet Take 1 tablet (40 mg total) by mouth daily. 30 tablet 0  . Rivaroxaban (XARELTO) 15 MG TABS tablet Take 1 tablet (15 mg total) by mouth daily with supper. 30 tablet 1  . Tamsulosin HCl (FLOMAX) 0.4 MG CAPS Take 1 capsule (0.4 mg total) by mouth daily. 30 capsule 0   No current facility-administered medications for this visit.     Social History   Socioeconomic History  . Marital status: Married    Spouse name: Not on file  . Number of children: Not on file  . Years of education: Not on file  . Highest education level: Not on file  Occupational History  . Not on file  Social Needs  . Financial resource strain: Not on file  . Food insecurity:    Worry: Not on file    Inability: Not on file  . Transportation needs:    Medical: Not on file    Non-medical: Not on file  Tobacco Use  . Smoking status: Former Smoker    Packs/day: 1.00    Years: 30.00    Pack years: 30.00    Types: Cigarettes  . Smokeless tobacco: Never Used  . Tobacco comment: quit ~ 1985  Substance and Sexual Activity  . Alcohol use: No    Alcohol/week: 0.0 oz  . Drug use: No  . Sexual activity: Not on file  Lifestyle  . Physical activity:    Days per week: Not on file    Minutes per session: Not on file  . Stress: Not on file  Relationships  . Social connections:    Talks on phone: Not on file    Gets together: Not on file    Attends religious service: Not on file    Active member of club or organization:  Not on file    Attends meetings of clubs or organizations: Not on file    Relationship status: Not on file  . Intimate partner violence:    Fear of current or ex partner: Not on file    Emotionally abused: Not on file    Physically abused: Not on file    Forced sexual activity: Not on file  Other Topics Concern  . Not on file  Social History Narrative  . Not on file    Socially he is married, has 3 children 7 grandchildren. He does not use tobacco or alcohol.  ROS General: Negative; No fevers, chills, or night sweats; Moderate obesity HEENT: Negative; No changes in vision or hearing, sinus congestion, difficulty swallowing Pulmonary: Negative; No cough, wheezing, shortness of breath, hemoptysis Cardiovascular: Negative; No chest pain, presyncope, syncope, palpatations GI: Recent GI bleed, status post endoscopy and colonoscopy without definitive abnormality. GU: Negative; No dysuria, hematuria, or difficulty voiding Musculoskeletal: History of paraspinal myositis  Hematologic/Oncology: Negative; no easy bruising, bleeding Endocrine: Negative; no heat/cold intolerance; no diabetes Neuro: Occasional paresthesias; no changes in balance, headaches Skin: Negative; No rashes or skin lesions Psychiatric: Negative; No behavioral problems, depression Sleep: Positive for sleep apnea on CPAP; No snoring, daytime sleepiness, hypersomnolence, bruxism, restless legs, hypnogognic hallucinations, no cataplexy Other comprehensive 14 point system review is negative.   PE BP (!) 148/62   Pulse 60   Ht 5' 6"  (1.676 m)   Wt 199 lb 12.8 oz (  90.6 kg)   BMI 32.25 kg/m    Repeat blood pressure by me was 160/64.  Wt Readings from Last 3 Encounters:  06/28/17 199 lb 12.8 oz (90.6 kg)  03/13/17 195 lb (88.5 kg)  01/10/17 191 lb (86.6 kg)   General: Alert, oriented, no distress.  Skin: normal turgor, no rashes, warm and dry HEENT: Normocephalic, atraumatic. Pupils equal round and reactive to  light; sclera anicteric; extraocular muscles intact;  Nose without nasal septal hypertrophy Mouth/Parynx benign; Mallinpatti scale 3 Neck: No JVD, no carotid bruits; normal carotid upstroke Lungs: clear to ausculatation and percussion; no wheezing or rales Chest wall: without tenderness to palpitation Heart: PMI not displaced, RRR, s1 s2 normal, 1/6 systolic murmur, no diastolic murmur, no rubs, gallops, thrills, or heaves Abdomen: soft, nontender; no hepatosplenomehaly, BS+; abdominal aorta nontender and not dilated by palpation. Back: no CVA tenderness Pulses 2+ Musculoskeletal: full range of motion, normal strength, no joint deformities Extremities: 1+ left leg edema, none on the right; no clubbing, cyanosis, Homan's sign negative  Neurologic: grossly nonfocal; Cranial nerves grossly wnl Psychologic: Normal mood and affect   ECG (independently read by me): Sinus rhythm appropriate atrial sensing and atrial pacing,  right bundle branch block, left axis deviation  September 2018 ECG (independently read by me): normal sinus rhythm at 60 bpm.  Incomplete right bundle branch block.  Mild inferolateral ST changes.  May 2018 ECG (independently read by me): sinus bradycardia 58 bpm.  Left axis deviation.  Right bundle branch block with repolarization changes.  QTc interval 471 ms.  April 2018 ECG (independently read by me):NSR At 61, left axis deviation.  Right bundle branch block.  September 2017 ECG (independently read by me): Normal sinus rhythm with mild sinus arrhythmia.  First degree AV block with a PR interval at 212 ms.  Right bundle-branch block.  March 2017 ECG (independently read by me): Normal sinus rhythm at 61 bpm.  Right bundle branch block with repolarization changes.  QTc interval 477 ms.  PR interval 204 ms.  October 2016 ECG (independently read by me): Normal sinus rhythm at 65 bpm.  Right bundle branch block.  First degree AV block.  June 2015 ECG (independently read by  me): Normal sinus rhythm at 61 beats per minute.  Right bundle branch block.  Mild voltage criteria for LVH in aVL  ECG (independently read by me): Sinus rhythm at 60 beats per minute. Right bundle branch block; PAC, inferolateral T abnormalities.  LABS: I personally reviewed the lab work from 04/14/2016 done at [facility] 12/25/2016 12/24/2016 12/23/2016  Glucose 65 - 99 mg/dL 94 89 79  BUN 6 - 20 mg/dL 34(H) 30(H) 27(H)  Creatinine 0.61 - 1.24 mg/dL 2.26(H) 2.12(H) 2.12(H)  BUN/Creat Ratio 10 - 24 - - -  Sodium 135 - 145 mmol/L 142 139 143  Potassium 3.5 - 5.1 mmol/L 4.3 3.7 3.1(L)  Chloride 101 - 111 mmol/L 106 103 103  CO2 22 - 32 mmol/L 27 26 26   Calcium 8.9 - 10.3 mg/dL 9.5 9.1 9.3   Hepatic Function Latest Ref [facility] 09/07/2016 09/06/2016 10/27/2015  Total Protein 6.5 - 8.1 g/dL 4.9(L) 5.1(L) 6.9  Albumin 3.5 - 5.0 g/dL 2.8(L) 3.0(L) 4.2  AST 15 - 41 U/L 19 21 24   ALT 17 - 63 U/L 16(L) 17 21  Alk Phosphatase 38 - 126 U/L 48 49 97  Total Bilirubin 0.3 - 1.2 mg/dL 0.5 0.4 0.5   CBC Latest  Ref [facility] 12/21/2016 12/20/2016 09/08/2016  WBC 4.0 - 10.5 K/uL 5.2 5.7 6.2  Hemoglobin 13.0 - 17.0 g/dL 11.2(L) 12.2(L) 8.8(L)  Hematocrit 39.0 - 52.0 % 35.9(L) 38.7(L) 28.0(L)  Platelets 150 - 400 K/uL 141(L) 152 204   Lab Results  Component Value Date   MCV 82.3 12/21/2016   MCV 81.1 12/20/2016   MCV 90.0 09/08/2016   Lab Results  Component Value Date   TSH 3.605 12/21/2016   No results found for: HGBA1C   Lipid Panel     Component Value Date/Time   CHOL 104 12/22/2016 0535   TRIG 91 12/22/2016 0535   HDL 29 (L) 12/22/2016 0535   CHOLHDL 3.6 12/22/2016 0535   VLDL 18 12/22/2016 0535   LDLCALC 57 12/22/2016 0535     BNP No results found for: PROBNP  Lipid Panel     Component Value Date/Time   CHOL 104 12/22/2016 0535   Lipid Panel     Component Value Date/Time   CHOL 104 12/22/2016 0535   TRIG 91 12/22/2016 0535   HDL 29 (L)  12/22/2016 0535   CHOLHDL 3.6 12/22/2016 0535   VLDL 18 12/22/2016 0535   LDLCALC 57 12/22/2016 0535    RADIOLOGY: No results found.  IMPRESSION:  1. Essential hypertension   2. Pacemaker   3. OSA on CPAP   4. Paroxysmal atrial fibrillation (HCC)   5. Anticoagulated   6. Leg edema, left   7. Chronic kidney disease (CKD), stage IV (severe) (HCC)     ASSESSMENT AND PLAN: [name] is a 77 year old Caucasian male who is 27 years status post CABG revascularization surgery in 1992 and 7 years since a stent was placed in the LAD beyond this diagonal vessel in 2012. He has a documented atretic LIMA graft and a patent vein graft which supplies this diagonal graft.   His prior echo Doppler study in 2013  showed an ejection fraction of 50-55% and probable grade 2 diastolic dysfunction. He did have mild left atrial dilatation and mild mitral regurgitation.  On his echo Doppler study in April 2018 Ejection fraction remained stable at 20-10%; grade 2 diastolic dysfunction, and there was evidence for  severe left atrial enlargement with mild right atrial enlargement.  Mild pulmonary hypertension.  His nuclear perfusion study remained in the low risk with only a small defect in the mid apical and anterior wall consistent with prior infarction without associated ischemia.  He has a history of PAF which he was started on anticoagulation therapy and also has developed renal insufficiency.  Since his last evaluation I reviewed his numerous office visits and subsequent hospitalization.  He is now status post permanent pacemaker implantation for symptomatic bradycardia.  Of note, in December his creatinine had increased to 2.26.  His last echo Doppler study in December 2018 showed an EF of 40 to 45% with moderate LVH.  He is now appropriately sensing and pacing the atrium.  His blood pressure today is elevated and on repeat by me was 160/64.  He currently is on amlodipine 5 mg, furosemide 20 mg, losartan 50 mg in  addition to Toprol-XL 25 mg.  I am increasing amlodipine to 7.5 mg daily.  I have recommended support stockings particularly with his 1+ leg edema.  He also is followed by Dr. Vale Haven for his renal insufficiency he will be seeing him back for follow-up evaluation.  He is on atorvastatin 20 mg for hyperlipidemia.  LDL in December 2018 was 57.  He is on anticoagulation with Xarelto and has not had any bleeding.  Hemoglobin on March 23, 2017 was 11.9.  He has obstructive sleep apnea and continues to use CPAP with 100% compliance.  I will see him in 6 months for reevaluation.  Time spent: 25 minutes   Troy Sine, MD, [facility]  06/30/2017 11:34 AM

## 2017-06-28 NOTE — Patient Instructions (Signed)
Medication Instructions: INCREASE the Amlodipine to 7.5 mg daily  If you need a refill on your cardiac medications before your next appointment, please call your pharmacy.    Follow-Up: Your physician wants you to follow-up in 6 months with Dr. Claiborne Billings. You will receive a reminder letter in the mail two months in advance. If you don't receive a letter, please call our office at [phone] to schedule this follow-up appointment.   Special Instructions: Please buy some support stockings/compression stockings to wear during the day to help with the swelling.   Thank you for choosing [facility] at [facility]!!

## 2017-06-30 ENCOUNTER — Encounter: Payer: Self-pay | Admitting: Cardiovascular Disease

## 2017-07-16 DIAGNOSIS — I4891 Unspecified atrial fibrillation: Secondary | ICD-10-CM | POA: Diagnosis not present

## 2017-07-16 DIAGNOSIS — Z7901 Long term (current) use of anticoagulants: Secondary | ICD-10-CM | POA: Diagnosis not present

## 2017-07-17 ENCOUNTER — Telehealth: Payer: Self-pay | Admitting: Cardiovascular Disease

## 2017-07-17 NOTE — Telephone Encounter (Signed)
Returned call to patient of Dr. Claiborne Billings. He states he would like a ED med - his PCP suggested he get this from cardiologist. He has taken Cialis in the past. He is aware MD is on vacation and patient goes out of town next week but he can be notified of MD opinion on ED med via cell phone  He is requesting alterative to xarelto d/t being in donut hole. Explained only non-brand anticoagulant is warfarin which requires blood monitoring and he is not interested in this. He states he will not qualify for patient assistance d/t income.   Samples provided - xarelto 15mg  #4 botteles (lot: [id number], exp: 04/2019)

## 2017-07-17 NOTE — Telephone Encounter (Signed)
New message   Patient wants to request medication for erectile dysfunction. Also requesting alternative med for Xarelto.   Pt c/o medication issue:  1. Name of Medication: Rivaroxaban (XARELTO) 15 MG TABS tablet  2. How are you currently taking this medication (dosage and times per day)?   3. Are you having a reaction (difficulty breathing--STAT)?   4. What is your medication issue? Too costly- patient in donut hole with Medicare.      1.  What medication and dosage are you requesting samples for?Rivaroxaban (XARELTO) 15 MG TABS tablet  2.  Are you currently out of this medication? no

## 2017-07-19 LAB — CUP PACEART REMOTE DEVICE CHECK
Battery Remaining Longevity: 123 mo
Battery Voltage: 3.12 V
Brady Statistic AP VP Percent: 5.26 %
Brady Statistic AP VS Percent: 40.02 %
Brady Statistic AS VP Percent: 11.7 %
Brady Statistic AS VS Percent: 43.02 %
Brady Statistic RA Percent Paced: 45.2 %
Brady Statistic RV Percent Paced: 16.96 %
Date Time Interrogation Session: 20190604203420
Implantable Lead Implant Date: 20181217
Implantable Lead Implant Date: 20181217
Implantable Lead Location: 753859
Implantable Lead Location: 753859
Implantable Lead Model: 3830
Implantable Lead Model: 5076
Implantable Pulse Generator Implant Date: 20181217
Lead Channel Impedance Value: 342 Ohm
Lead Channel Impedance Value: 342 Ohm
Lead Channel Impedance Value: 437 Ohm
Lead Channel Impedance Value: 456 Ohm
Lead Channel Pacing Threshold Amplitude: 0.75 V
Lead Channel Pacing Threshold Amplitude: 0.875 V
Lead Channel Pacing Threshold Pulse Width: 0.4 ms
Lead Channel Pacing Threshold Pulse Width: 0.4 ms
Lead Channel Sensing Intrinsic Amplitude: 1.375 mV
Lead Channel Sensing Intrinsic Amplitude: 1.375 mV
Lead Channel Sensing Intrinsic Amplitude: 20 mV
Lead Channel Sensing Intrinsic Amplitude: 20 mV
Lead Channel Setting Pacing Amplitude: 2 V
Lead Channel Setting Pacing Amplitude: 2.5 V
Lead Channel Setting Pacing Pulse Width: 1 ms
Lead Channel Setting Sensing Sensitivity: 1.2 mV

## 2017-07-20 DIAGNOSIS — E785 Hyperlipidemia, unspecified: Secondary | ICD-10-CM | POA: Diagnosis not present

## 2017-07-20 DIAGNOSIS — Z7689 Persons encountering health services in other specified circumstances: Secondary | ICD-10-CM | POA: Diagnosis not present

## 2017-07-23 NOTE — Telephone Encounter (Signed)
With his age and established CAD, rather than initiate long-acting Cialis can consider generic sildenafil 20 mg tablets to take needed.  May need 1-3 tablets to take initially depending upon response.

## 2017-07-24 NOTE — Telephone Encounter (Signed)
LMTCB

## 2017-07-27 MED ORDER — SILDENAFIL CITRATE 20 MG PO TABS: 20.0000 mg | ORAL_TABLET | Freq: Every day | ORAL | 0 refills | Status: DC | PRN

## 2017-07-27 NOTE — Telephone Encounter (Signed)
Returned call to patient. Explained options for sildenafil - can send to local pharmacy and it may not be covered/he may have to pay out of pocket. Offered to send Rx to [facility] tabs for $120 but he declined. Rx(s) sent to pharmacy electronically.

## 2017-07-27 NOTE — Telephone Encounter (Signed)
Follow up    Patient is returning call in reference to mediation. Please call

## 2017-07-30 DIAGNOSIS — N390 Urinary tract infection, site not specified: Secondary | ICD-10-CM | POA: Diagnosis not present

## 2017-08-09 DIAGNOSIS — Z8249 Family history of ischemic heart disease and other diseases of the circulatory system: Secondary | ICD-10-CM | POA: Diagnosis not present

## 2017-08-09 DIAGNOSIS — R7303 Prediabetes: Secondary | ICD-10-CM | POA: Diagnosis not present

## 2017-08-09 DIAGNOSIS — N184 Chronic kidney disease, stage 4 (severe): Secondary | ICD-10-CM | POA: Diagnosis not present

## 2017-08-09 DIAGNOSIS — H6123 Impacted cerumen, bilateral: Secondary | ICD-10-CM | POA: Diagnosis not present

## 2017-08-09 DIAGNOSIS — Z8489 Family history of other specified conditions: Secondary | ICD-10-CM | POA: Diagnosis not present

## 2017-08-09 DIAGNOSIS — I4891 Unspecified atrial fibrillation: Secondary | ICD-10-CM | POA: Diagnosis not present

## 2017-08-09 DIAGNOSIS — Z0000 Encounter for general adult medical examination without abnormal findings: Secondary | ICD-10-CM | POA: Diagnosis not present

## 2017-08-09 DIAGNOSIS — Z136 Encounter for screening for cardiovascular disorders: Secondary | ICD-10-CM | POA: Diagnosis not present

## 2017-08-31 DIAGNOSIS — N179 Acute kidney failure, unspecified: Secondary | ICD-10-CM | POA: Diagnosis not present

## 2017-08-31 DIAGNOSIS — N2581 Secondary hyperparathyroidism of renal origin: Secondary | ICD-10-CM | POA: Diagnosis not present

## 2017-08-31 DIAGNOSIS — I129 Hypertensive chronic kidney disease with stage 1 through stage 4 chronic kidney disease, or unspecified chronic kidney disease: Secondary | ICD-10-CM | POA: Diagnosis not present

## 2017-08-31 DIAGNOSIS — N183 Chronic kidney disease, stage 3 (moderate): Secondary | ICD-10-CM | POA: Diagnosis not present

## 2017-08-31 DIAGNOSIS — D631 Anemia in chronic kidney disease: Secondary | ICD-10-CM | POA: Diagnosis not present

## 2017-09-03 ENCOUNTER — Telehealth: Payer: Self-pay | Admitting: Cardiovascular Disease

## 2017-09-03 NOTE — Telephone Encounter (Signed)
Received records from [place] on 09/03/17, Appt 10/15/17 @ 10:40AM. [place]

## 2017-09-11 ENCOUNTER — Ambulatory Visit (INDEPENDENT_AMBULATORY_CARE_PROVIDER_SITE_OTHER): Payer: Medicare Other | Admitting: *Deleted

## 2017-09-11 DIAGNOSIS — Z950 Presence of cardiac pacemaker: Secondary | ICD-10-CM

## 2017-09-11 DIAGNOSIS — I441 Atrioventricular block, second degree: Secondary | ICD-10-CM

## 2017-09-11 DIAGNOSIS — R001 Bradycardia, unspecified: Secondary | ICD-10-CM | POA: Diagnosis not present

## 2017-09-11 NOTE — Progress Notes (Signed)
Remote pacemaker transmission.   

## 2017-09-21 ENCOUNTER — Telehealth: Payer: Self-pay | Admitting: Cardiovascular Disease

## 2017-09-21 NOTE — Telephone Encounter (Signed)
° °  Patient out of medication . Patient in the pharmacy now.      *STAT* If patient is at the pharmacy, call can be transferred to refill team.   1. Which medications need to be refilled? (please list name of each medication and dose if known) amLODipine (NORVASC) 5 MG tablet  2. Which pharmacy/location (including street and city if local pharmacy) is medication to be sent to? [facility] [place].  3. Do they need a 30 day or 90 day supply? [place]

## 2017-09-24 MED ORDER — AMLODIPINE BESYLATE 5 MG PO TABS: 7.5000 mg | ORAL_TABLET | Freq: Every day | ORAL | 5 refills | Status: DC

## 2017-09-28 DIAGNOSIS — N183 Chronic kidney disease, stage 3 (moderate): Secondary | ICD-10-CM | POA: Diagnosis not present

## 2017-09-28 DIAGNOSIS — Z23 Encounter for immunization: Secondary | ICD-10-CM | POA: Diagnosis not present

## 2017-10-02 ENCOUNTER — Other Ambulatory Visit: Payer: Self-pay | Admitting: Cardiovascular Disease

## 2017-10-03 ENCOUNTER — Other Ambulatory Visit: Payer: Self-pay | Admitting: Cardiovascular Disease

## 2017-10-03 LAB — CUP PACEART REMOTE DEVICE CHECK
Battery Remaining Longevity: 133 mo
Battery Voltage: 3.07 V
Brady Statistic AP VP Percent: 0.12 %
Brady Statistic AP VS Percent: 38.48 %
Brady Statistic AS VP Percent: 0.06 %
Brady Statistic AS VS Percent: 61.33 %
Brady Statistic RA Percent Paced: 38.25 %
Brady Statistic RV Percent Paced: 1.19 %
Date Time Interrogation Session: 20190903010405
Implantable Lead Implant Date: 20181217
Implantable Lead Implant Date: 20181217
Implantable Lead Location: 753859
Implantable Lead Location: 753859
Implantable Lead Model: 3830
Implantable Lead Model: 5076
Implantable Pulse Generator Implant Date: 20181217
Lead Channel Impedance Value: 323 Ohm
Lead Channel Impedance Value: 323 Ohm
Lead Channel Impedance Value: 437 Ohm
Lead Channel Impedance Value: 475 Ohm
Lead Channel Pacing Threshold Amplitude: 0.75 V
Lead Channel Pacing Threshold Amplitude: 0.75 V
Lead Channel Pacing Threshold Pulse Width: 0.4 ms
Lead Channel Pacing Threshold Pulse Width: 0.4 ms
Lead Channel Sensing Intrinsic Amplitude: 1.125 mV
Lead Channel Sensing Intrinsic Amplitude: 1.125 mV
Lead Channel Sensing Intrinsic Amplitude: 18.875 mV
Lead Channel Sensing Intrinsic Amplitude: 18.875 mV
Lead Channel Setting Pacing Amplitude: 2 V
Lead Channel Setting Pacing Amplitude: 2.5 V
Lead Channel Setting Pacing Pulse Width: 1 ms
Lead Channel Setting Sensing Sensitivity: 1.2 mV

## 2017-10-05 DIAGNOSIS — R350 Frequency of micturition: Secondary | ICD-10-CM | POA: Diagnosis not present

## 2017-10-05 DIAGNOSIS — R3914 Feeling of incomplete bladder emptying: Secondary | ICD-10-CM | POA: Diagnosis not present

## 2017-11-09 DIAGNOSIS — G4733 Obstructive sleep apnea (adult) (pediatric): Secondary | ICD-10-CM | POA: Diagnosis not present

## 2017-11-26 DIAGNOSIS — N183 Chronic kidney disease, stage 3 (moderate): Secondary | ICD-10-CM | POA: Diagnosis not present

## 2017-12-11 ENCOUNTER — Telehealth: Payer: Self-pay

## 2017-12-11 NOTE — Telephone Encounter (Signed)
Attempted to confirm remote transmission with pt. No answer and was unable to leave a message.   

## 2017-12-13 ENCOUNTER — Encounter: Payer: Self-pay | Admitting: Cardiology

## 2017-12-14 ENCOUNTER — Encounter: Payer: Self-pay | Admitting: Internal Medicine

## 2017-12-14 ENCOUNTER — Ambulatory Visit: Payer: Medicare Other | Admitting: Internal Medicine

## 2017-12-14 VITALS — BP 126/68 | HR 67 | Ht 66.0 in | Wt 211.2 lb

## 2017-12-14 DIAGNOSIS — N183 Chronic kidney disease, stage 3 unspecified: Secondary | ICD-10-CM

## 2017-12-14 DIAGNOSIS — I480 Paroxysmal atrial fibrillation: Secondary | ICD-10-CM

## 2017-12-14 DIAGNOSIS — I2510 Atherosclerotic heart disease of native coronary artery without angina pectoris: Secondary | ICD-10-CM | POA: Diagnosis not present

## 2017-12-14 DIAGNOSIS — I4510 Unspecified right bundle-branch block: Secondary | ICD-10-CM

## 2017-12-14 DIAGNOSIS — I10 Essential (primary) hypertension: Secondary | ICD-10-CM

## 2017-12-14 DIAGNOSIS — I441 Atrioventricular block, second degree: Secondary | ICD-10-CM | POA: Diagnosis not present

## 2017-12-14 LAB — CUP PACEART INCLINIC DEVICE CHECK
Battery Remaining Longevity: 135 mo
Battery Voltage: 3.03 V
Brady Statistic AP VP Percent: 1.79 %
Brady Statistic AP VS Percent: 38.02 %
Brady Statistic AS VP Percent: 3.91 %
Brady Statistic AS VS Percent: 56.28 %
Brady Statistic RA Percent Paced: 39.66 %
Brady Statistic RV Percent Paced: 6.12 %
Date Time Interrogation Session: 20191206130514
Implantable Lead Implant Date: 20181217
Implantable Lead Implant Date: 20181217
Implantable Lead Location: 753859
Implantable Lead Location: 753859
Implantable Lead Model: 3830
Implantable Lead Model: 5076
Implantable Pulse Generator Implant Date: 20181217
Lead Channel Impedance Value: 323 Ohm
Lead Channel Impedance Value: 323 Ohm
Lead Channel Impedance Value: 399 Ohm
Lead Channel Impedance Value: 418 Ohm
Lead Channel Pacing Threshold Amplitude: 0.5 V
Lead Channel Pacing Threshold Amplitude: 0.75 V
Lead Channel Pacing Threshold Pulse Width: 0.4 ms
Lead Channel Pacing Threshold Pulse Width: 0.4 ms
Lead Channel Sensing Intrinsic Amplitude: 14 mV
Lead Channel Sensing Intrinsic Amplitude: 3 mV
Lead Channel Setting Pacing Amplitude: 2 V
Lead Channel Setting Pacing Amplitude: 2.5 V
Lead Channel Setting Pacing Pulse Width: 1 ms
Lead Channel Setting Sensing Sensitivity: 1.2 mV

## 2017-12-14 NOTE — Patient Instructions (Signed)
Medication Instructions:  Your physician recommends that you continue on your current medications as directed. Please refer to the Current Medication list given to you today.  Labwork: None ordered.  Testing/Procedures: None ordered.  Follow-Up: Your physician recommends that you schedule a follow-up appointment in:   One Year with Dillon Bjork, PA  Remote monitoring is used to monitor your ICD from home. This monitoring reduces the number of office visits required to check your device to one time per year. It allows [place] to keep an eye on the functioning of your device to ensure it is working properly. You are scheduled for a device check from home on 03/15/2018. You may send your transmission at any time that day. If you have a wireless device, the transmission will be sent automatically. After your physician reviews your transmission, you will receive a postcard with your next transmission date.    Any Other Special Instructions Will Be Listed Below (If Applicable).     If you need a refill on your cardiac medications before your next appointment, please call your pharmacy.

## 2017-12-14 NOTE — Progress Notes (Signed)
Patient Care Team: [name], MD as PCP - General (Internal Medicine) Troy Sine, MD as PCP - Cardiology (Cardiology)   HPI  Paul Price is a 77 y.o. 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  He is much improved since pacemaker implantation.       He has a history of ischemic heart disease. He is status post bypass surgery.     He has paroxysmal atrial fibrillation and is anticoagulated with Xarelto.  He is appropriately dosed given his renal insufficiency.  The patient denies  nocturnal dyspnea, orthopnea   There have been no palpitations, lightheadedness or syncope.  Intermittent chest pains but mostly associated with upper arm activity.  Relates to active motion.  Some shortness of breath and tracks with his peripheral edema  DATE TEST EF   4/18 MYOVIEW  47 % Small scar w/o ischemia   12/18 Echo   40-45% % LVH mod          Date Cr K Hgb  12/18 2.26  11.2  11/19 1.85 4.2 10.7     Records and Results Reviewed   Past Medical History:  Diagnosis Date  . Anemia   . Arthritis    "shoulders" (09/07/2016)  . Atrial fibrillation ([place])   . BPH (benign prostatic hyperplasia)   . Chronic kidney disease (CKD), stage III (moderate) (HCC)   . Coronary artery disease   . DDD (degenerative disc disease), cervical   . DVT (deep venous thrombosis) ([place]) 12/2013   LLE  . GERD (gastroesophageal reflux disease)   . Gout   . High cholesterol   . History of blood transfusion 09/06/2016  . History of scarlet fever 1940s  . History of stress test 06/2011   No significant ischemia, this is a low risk scan. Clinical correlation recommended Abnormal myocardial perfusion study.  Marland Kitchen Hx of echocardiogram 05/2009   EF 40-45%, he did have mild annular calcification with mild-to-moderate MR and mild TR as well as aortic valve sclerosis. Estimated RV systolic pressure was 21 mm.  . Hypertension   . Kidney failure   . 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 Neck pain    "not chronic" (09/07/2016)  . Obesity   . OSA on CPAP   . RBBB   . Renal insufficiency   . Spinal stenosis of lumbar region     Past Surgical History:  Procedure Laterality Date  . APPENDECTOMY    . CARDIAC CATHETERIZATION  2009   he was found to have a atretic LIMA graft to his LAD and had a patent vein graft supplying his diagonal vessel. At that time, he had 70% narrowing in his LAD beyond a diagonal vessel which was initially treated medically.  . COLONOSCOPY WITH PROPOFOL Left 09/09/2016   Procedure: COLONOSCOPY WITH PROPOFOL;  Surgeon: Arta Silence, MD;  Location: [facility];  Service: Endoscopy;  Laterality: Left;  . CORONARY ANGIOPLASTY WITH STENT PLACEMENT  April 2012   LAD DES  . CORONARY ARTERY BYPASS GRAFT  1992   with LIMA to the LAD and diagonal.  . ESOPHAGOGASTRODUODENOSCOPY (EGD) WITH PROPOFOL Left 09/08/2016   Procedure: ESOPHAGOGASTRODUODENOSCOPY (EGD) WITH PROPOFOL;  Surgeon: Ronnette Juniper, MD;  Location: [facility];  Service: Gastroenterology;  Laterality: Left;  . INGUINAL HERNIA REPAIR     "don't remeimber which side"  . PACEMAKER IMPLANT N/A 12/25/2016   Procedure: PACEMAKER IMPLANT;  Surgeon: Deboraha Sprang, MD;  Location: [facility];  Service: Cardiovascular;  Laterality: N/A;  . TONSILLECTOMY      Current Outpatient Medications  Medication Sig Dispense Refill  . acetaminophen (TYLENOL) 500 MG tablet Take 500 mg by mouth every 6 (six) hours as needed for moderate pain.    Marland Kitchen allopurinol (ZYLOPRIM) 300 MG tablet Take 300 mg by mouth daily.    Marland Kitchen amLODipine (NORVASC) 5 MG tablet Take 1.5 tablets (7.5 mg total) by mouth daily. 45 tablet 5  . atorvastatin (LIPITOR) 20 MG tablet TAKE 1 TABLET BY MOUTH DAILY 90 tablet 2  . atorvastatin (LIPITOR) 20 MG tablet TAKE 1 TABLET BY MOUTH DAILY 90 tablet 3  . fenofibrate micronized (LOFIBRA) 134 MG capsule Take 134 mg by mouth daily before breakfast.    .  furosemide (LASIX) 40 MG tablet Take 0.5 tablets (20 mg total) by mouth daily.    Marland Kitchen losartan (COZAAR) 50 MG tablet Take 1 tablet by mouth daily.    . metoprolol succinate (TOPROL-XL) 25 MG 24 hr tablet Take 1 tablet (25 mg total) by mouth daily. 90 tablet 3  . pantoprazole (PROTONIX) 40 MG tablet Take 1 tablet (40 mg total) by mouth daily. 30 tablet 0  . Rivaroxaban (XARELTO) 15 MG TABS tablet Take 1 tablet (15 mg total) by mouth daily with supper. 30 tablet 1  . sildenafil (REVATIO) 20 MG tablet Take 1-3 tablets (20-60 mg total) by mouth daily as needed. 25 tablet 0  . Tamsulosin HCl (FLOMAX) 0.4 MG CAPS Take 1 capsule (0.4 mg total) by mouth daily. 30 capsule 0   No current facility-administered medications for this visit.     Allergies  Allergen Reactions  . Nitrostat [Nitroglycerin] Other (See Comments)    Causes blood pressure to "bottom out"      Review of Systems negative except from HPI and PMH  Physical Exam BP 126/68   Pulse 67   Ht 5\' 6"  (1.676 m)   Wt 211 lb 3.2 oz (95.8 kg)   SpO2 98%   BMI 34.09 kg/m  Well developed and nourished in no acute distress HENT normal Neck supple with JVP-flat Clear Device pocket well healed; without hematoma or erythema.  There is no tethering  Regular rate and rhythm, no murmurs or gallops Abd-soft with active BS No Clubbing cyanosis 1+edema Skin-warm and dry A & Oriented  Grossly normal sensory and motor function   ECG demonstrated sinus rhythm at 67 Intervals 20/13/44 Right bundle branch block h  Assessment and  Plan   Sinus exit block with symptomatic bradycardia  Right bundle branch block  Pacemaker [employer]  Ischemic heart disease with prior bypass ejection fraction 40-45%  Hypertension  syncope  Acute chronic systolic/diastolic heart failure  Renal Insufficiency  Gd  4  Interval atrial fibrillation  On Anticoagulation;  No bleeding issues   Some volume overload.  We will increase his diuretic  from 20--40 mg daily for 3 days.  He will then resume it at 20 mg a day and use it as needed for increasing no edema.  We spent more than 50% of our >25 min visit in face to face counseling regarding the above     Current medicines are reviewed at length with the patient today .  The patient does not  have concerns regarding medicines.

## 2017-12-18 ENCOUNTER — Telehealth: Payer: Self-pay | Admitting: Internal Medicine

## 2017-12-18 NOTE — Telephone Encounter (Signed)
New message   Pt c/o medication issue:  1. Name of Medication: furosemide (LASIX) 40 MG tablet  2. How are you currently taking this medication (dosage and times per day)? 2 (40 mg) 1 time daily for 3 days  3. Are you having a reaction (difficulty breathing--STAT)? No   4. What is your medication issue? Patient needs to know if he should continue to take this medication after the 3 days. Please advise.

## 2017-12-18 NOTE — Telephone Encounter (Signed)
Pt calling today to clarify his lasix dose. Pt was advised to take 80mg  lasix x 3 days, then begin 20mg  qd prn. Pt states today he will be taking his last dose of 80mg  lasix and is down 3lbs with improved peripheral edema. Pt understands he is to begin 20mg  qd prn after today. He will call if he begins to take more frequently to discuss K supplement.   Pt had no additional questions.

## 2018-01-11 ENCOUNTER — Ambulatory Visit (INDEPENDENT_AMBULATORY_CARE_PROVIDER_SITE_OTHER): Payer: Medicare Other

## 2018-01-11 DIAGNOSIS — I495 Sick sinus syndrome: Secondary | ICD-10-CM

## 2018-01-11 DIAGNOSIS — R001 Bradycardia, unspecified: Secondary | ICD-10-CM

## 2018-01-13 LAB — CUP PACEART REMOTE DEVICE CHECK
Battery Remaining Longevity: 131 mo
Battery Voltage: 3 V
Brady Statistic AP VP Percent: 29.73 %
Brady Statistic AP VS Percent: 0.74 %
Brady Statistic AS VP Percent: 67.47 %
Brady Statistic AS VS Percent: 2.06 %
Brady Statistic RA Percent Paced: 30.31 %
Brady Statistic RV Percent Paced: 97.24 %
Date Time Interrogation Session: 20200103004836
Implantable Lead Implant Date: 20181217
Implantable Lead Implant Date: 20181217
Implantable Lead Location: 753859
Implantable Lead Location: 753859
Implantable Lead Model: 3830
Implantable Lead Model: 5076
Implantable Pulse Generator Implant Date: 20181217
Lead Channel Impedance Value: 323 Ohm
Lead Channel Impedance Value: 323 Ohm
Lead Channel Impedance Value: 399 Ohm
Lead Channel Impedance Value: 418 Ohm
Lead Channel Pacing Threshold Amplitude: 0.625 V
Lead Channel Pacing Threshold Amplitude: 0.75 V
Lead Channel Pacing Threshold Pulse Width: 0.4 ms
Lead Channel Pacing Threshold Pulse Width: 0.4 ms
Lead Channel Sensing Intrinsic Amplitude: 1.375 mV
Lead Channel Sensing Intrinsic Amplitude: 1.375 mV
Lead Channel Sensing Intrinsic Amplitude: 17.125 mV
Lead Channel Sensing Intrinsic Amplitude: 17.125 mV
Lead Channel Setting Pacing Amplitude: 2 V
Lead Channel Setting Pacing Amplitude: 2.5 V
Lead Channel Setting Pacing Pulse Width: 1 ms
Lead Channel Setting Sensing Sensitivity: 1.2 mV

## 2018-01-14 NOTE — Progress Notes (Signed)
Remote pacemaker transmission.   

## 2018-01-25 DIAGNOSIS — N183 Chronic kidney disease, stage 3 (moderate): Secondary | ICD-10-CM | POA: Diagnosis not present

## 2018-02-11 ENCOUNTER — Encounter: Payer: Self-pay | Admitting: Internal Medicine

## 2018-02-11 DIAGNOSIS — R7303 Prediabetes: Secondary | ICD-10-CM | POA: Diagnosis not present

## 2018-02-11 DIAGNOSIS — N189 Chronic kidney disease, unspecified: Secondary | ICD-10-CM | POA: Diagnosis not present

## 2018-02-11 DIAGNOSIS — E7800 Pure hypercholesterolemia, unspecified: Secondary | ICD-10-CM | POA: Diagnosis not present

## 2018-02-11 DIAGNOSIS — D500 Iron deficiency anemia secondary to blood loss (chronic): Secondary | ICD-10-CM | POA: Diagnosis not present

## 2018-02-14 DIAGNOSIS — I10 Essential (primary) hypertension: Secondary | ICD-10-CM | POA: Diagnosis not present

## 2018-02-14 DIAGNOSIS — E7800 Pure hypercholesterolemia, unspecified: Secondary | ICD-10-CM | POA: Diagnosis not present

## 2018-02-14 DIAGNOSIS — K219 Gastro-esophageal reflux disease without esophagitis: Secondary | ICD-10-CM | POA: Diagnosis not present

## 2018-02-14 DIAGNOSIS — N184 Chronic kidney disease, stage 4 (severe): Secondary | ICD-10-CM | POA: Diagnosis not present

## 2018-02-14 DIAGNOSIS — I4891 Unspecified atrial fibrillation: Secondary | ICD-10-CM | POA: Diagnosis not present

## 2018-02-20 DIAGNOSIS — D631 Anemia in chronic kidney disease: Secondary | ICD-10-CM | POA: Diagnosis not present

## 2018-02-20 DIAGNOSIS — N2581 Secondary hyperparathyroidism of renal origin: Secondary | ICD-10-CM | POA: Diagnosis not present

## 2018-02-20 DIAGNOSIS — I129 Hypertensive chronic kidney disease with stage 1 through stage 4 chronic kidney disease, or unspecified chronic kidney disease: Secondary | ICD-10-CM | POA: Diagnosis not present

## 2018-02-20 DIAGNOSIS — N183 Chronic kidney disease, stage 3 (moderate): Secondary | ICD-10-CM | POA: Diagnosis not present

## 2018-02-20 DIAGNOSIS — N179 Acute kidney failure, unspecified: Secondary | ICD-10-CM | POA: Diagnosis not present

## 2018-03-06 DIAGNOSIS — N183 Chronic kidney disease, stage 3 (moderate): Secondary | ICD-10-CM | POA: Diagnosis not present

## 2018-03-06 DIAGNOSIS — I10 Essential (primary) hypertension: Secondary | ICD-10-CM | POA: Diagnosis not present

## 2018-03-14 DIAGNOSIS — D509 Iron deficiency anemia, unspecified: Secondary | ICD-10-CM | POA: Diagnosis not present

## 2018-04-08 DIAGNOSIS — N184 Chronic kidney disease, stage 4 (severe): Secondary | ICD-10-CM | POA: Diagnosis not present

## 2018-04-12 ENCOUNTER — Other Ambulatory Visit: Payer: Self-pay

## 2018-04-12 ENCOUNTER — Encounter: Payer: Medicare Other | Admitting: *Deleted

## 2018-04-15 ENCOUNTER — Telehealth: Payer: Self-pay

## 2018-04-15 NOTE — Telephone Encounter (Signed)
Left message for patient to remind of missed remote transmission.  

## 2018-04-18 ENCOUNTER — Ambulatory Visit (INDEPENDENT_AMBULATORY_CARE_PROVIDER_SITE_OTHER): Payer: Medicare Other | Admitting: *Deleted

## 2018-04-18 ENCOUNTER — Other Ambulatory Visit: Payer: Self-pay

## 2018-04-18 DIAGNOSIS — I495 Sick sinus syndrome: Secondary | ICD-10-CM

## 2018-04-18 LAB — CUP PACEART REMOTE DEVICE CHECK
Battery Remaining Longevity: 118 mo
Battery Voltage: 2.97 V
Brady Statistic AP VP Percent: 37.88 %
Brady Statistic AP VS Percent: 0 %
Brady Statistic AS VP Percent: 61.93 %
Brady Statistic AS VS Percent: 0.18 %
Brady Statistic RA Percent Paced: 35.97 %
Brady Statistic RV Percent Paced: 99.82 %
Date Time Interrogation Session: 20200409131910
Implantable Lead Implant Date: 20181217
Implantable Lead Implant Date: 20181217
Implantable Lead Location: 753859
Implantable Lead Location: 753859
Implantable Lead Model: 3830
Implantable Lead Model: 5076
Implantable Pulse Generator Implant Date: 20181217
Lead Channel Impedance Value: 323 Ohm
Lead Channel Impedance Value: 323 Ohm
Lead Channel Impedance Value: 418 Ohm
Lead Channel Impedance Value: 418 Ohm
Lead Channel Pacing Threshold Amplitude: 0.625 V
Lead Channel Pacing Threshold Amplitude: 0.75 V
Lead Channel Pacing Threshold Pulse Width: 0.4 ms
Lead Channel Pacing Threshold Pulse Width: 0.4 ms
Lead Channel Sensing Intrinsic Amplitude: 1.375 mV
Lead Channel Sensing Intrinsic Amplitude: 8.25 mV
Lead Channel Setting Pacing Amplitude: 2 V
Lead Channel Setting Pacing Amplitude: 2.5 V
Lead Channel Setting Pacing Pulse Width: 1 ms
Lead Channel Setting Sensing Sensitivity: 1.2 mV

## 2018-04-29 ENCOUNTER — Encounter: Payer: Self-pay | Admitting: Cardiology

## 2018-04-29 NOTE — Progress Notes (Signed)
Remote pacemaker transmission.   

## 2018-05-01 DIAGNOSIS — G4733 Obstructive sleep apnea (adult) (pediatric): Secondary | ICD-10-CM | POA: Diagnosis not present

## 2018-05-22 DIAGNOSIS — D649 Anemia, unspecified: Secondary | ICD-10-CM | POA: Diagnosis not present

## 2018-05-22 DIAGNOSIS — D509 Iron deficiency anemia, unspecified: Secondary | ICD-10-CM | POA: Diagnosis not present

## 2018-05-27 ENCOUNTER — Other Ambulatory Visit: Payer: Self-pay | Admitting: Cardiovascular Disease

## 2018-06-06 DIAGNOSIS — N184 Chronic kidney disease, stage 4 (severe): Secondary | ICD-10-CM | POA: Diagnosis not present

## 2018-07-18 ENCOUNTER — Encounter: Payer: Medicare Other | Admitting: *Deleted

## 2018-07-19 ENCOUNTER — Telehealth: Payer: Self-pay

## 2018-07-19 NOTE — Telephone Encounter (Signed)
Left message for patient to remind of missed remote transmission.  

## 2018-07-26 ENCOUNTER — Ambulatory Visit (INDEPENDENT_AMBULATORY_CARE_PROVIDER_SITE_OTHER): Payer: Medicare Other | Admitting: *Deleted

## 2018-07-26 DIAGNOSIS — I495 Sick sinus syndrome: Secondary | ICD-10-CM | POA: Diagnosis not present

## 2018-07-28 LAB — CUP PACEART REMOTE DEVICE CHECK
Battery Remaining Longevity: 110 mo
Battery Voltage: 2.96 V
Brady Statistic AP VP Percent: 36.27 %
Brady Statistic AP VS Percent: 3.8 %
Brady Statistic AS VP Percent: 57.9 %
Brady Statistic AS VS Percent: 2.02 %
Brady Statistic RA Percent Paced: 40.22 %
Brady Statistic RV Percent Paced: 94.17 %
Date Time Interrogation Session: 20200718130516
Implantable Lead Implant Date: 20181217
Implantable Lead Implant Date: 20181217
Implantable Lead Location: 753859
Implantable Lead Location: 753859
Implantable Lead Model: 3830
Implantable Lead Model: 5076
Implantable Pulse Generator Implant Date: 20181217
Lead Channel Impedance Value: 323 Ohm
Lead Channel Impedance Value: 342 Ohm
Lead Channel Impedance Value: 418 Ohm
Lead Channel Impedance Value: 437 Ohm
Lead Channel Pacing Threshold Amplitude: 0.625 V
Lead Channel Pacing Threshold Amplitude: 0.625 V
Lead Channel Pacing Threshold Pulse Width: 0.4 ms
Lead Channel Pacing Threshold Pulse Width: 0.4 ms
Lead Channel Sensing Intrinsic Amplitude: 1.375 mV
Lead Channel Sensing Intrinsic Amplitude: 1.375 mV
Lead Channel Sensing Intrinsic Amplitude: 11.125 mV
Lead Channel Sensing Intrinsic Amplitude: 11.125 mV
Lead Channel Setting Pacing Amplitude: 2 V
Lead Channel Setting Pacing Amplitude: 2.5 V
Lead Channel Setting Pacing Pulse Width: 1 ms
Lead Channel Setting Sensing Sensitivity: 1.2 mV

## 2018-07-31 ENCOUNTER — Encounter: Payer: Self-pay | Admitting: Cardiology

## 2018-07-31 NOTE — Progress Notes (Signed)
Remote pacemaker transmission.   

## 2018-08-07 DIAGNOSIS — N184 Chronic kidney disease, stage 4 (severe): Secondary | ICD-10-CM | POA: Diagnosis not present

## 2018-08-28 DIAGNOSIS — I10 Essential (primary) hypertension: Secondary | ICD-10-CM | POA: Diagnosis not present

## 2018-08-28 DIAGNOSIS — D649 Anemia, unspecified: Secondary | ICD-10-CM | POA: Diagnosis not present

## 2018-09-02 DIAGNOSIS — Z1212 Encounter for screening for malignant neoplasm of rectum: Secondary | ICD-10-CM | POA: Diagnosis not present

## 2018-09-04 ENCOUNTER — Telehealth: Payer: Self-pay | Admitting: Hematology

## 2018-09-04 ENCOUNTER — Other Ambulatory Visit: Payer: Self-pay | Admitting: Cardiovascular Disease

## 2018-09-04 NOTE — Telephone Encounter (Signed)
Received a new hem referral from Dr. Shelia Media for anemia. Paul Price has been cld and scheduled to see Dr. Irene Limbo on 9/22 at 10am. He's been made aware to arrive 15 minutes early.

## 2018-09-05 ENCOUNTER — Other Ambulatory Visit: Payer: Self-pay | Admitting: Cardiovascular Disease

## 2018-09-05 DIAGNOSIS — I10 Essential (primary) hypertension: Secondary | ICD-10-CM | POA: Diagnosis not present

## 2018-09-28 ENCOUNTER — Other Ambulatory Visit: Payer: Self-pay | Admitting: Cardiovascular Disease

## 2018-09-30 NOTE — Progress Notes (Signed)
HEMATOLOGY/ONCOLOGY CONSULTATION NOTE  Date of Service: 09/30/2018  Patient Care Team: [name], MD as PCP - General (Internal Medicine) Troy Sine, MD as PCP - Cardiology (Cardiology)  CHIEF COMPLAINTS/PURPOSE OF CONSULTATION:  Anemia  HISTORY OF PRESENTING ILLNESS:   Paul Price is a wonderful 78 y.o. male who has been referred to [place] by Dr Shelia Media for evaluation and management of anemia. The pt reports that he is doing well overall.    The pt reports that he does not feel differently than he did 6 months ago except for having improved energy. He has been taking iron pills for 3 weeks and has been feeling a significant boost of energy since. He has routinely done yardwork for hours, which is abnormal for him. Pt takes one iron pill per day with orange juice and has not noticed any stomach upset. He has not had a repeat iron test since Febuary. Pt's stools have been darker since beginning iron but they have not been black. Pt has not noticed any bloody stools but had a stool test about a month ago, which did find blood in his stool.   Pt does not currently have a GI but Dr. Arta Silence performed his colonoscopy and Dr. Ronnette Juniper performed his endoscopy, both in 2018. He has been taking Zarelto 79m daily for his Atrial Fibrillation for 3 years. He has been taking Allopurinol for his Gout, which has been severe in the past but is well controlled now. He has been on that for years. He is currently taking Lasix and is no longer taking Amlodipine. Pt reports that he maintains a balanced diet, is becoming more active and has lost 6-7 lbs in the last few weeks. Dr. JEmelia Loronis his Nephrologist. Pt experiences frequent urination and believes that he has an enlarged prostate. He is not currently seeing a Urologist.   Pt has had Upper Endoscopy completed on 09/08/2016 with results revealing "- Normal esophagus. - Z-line regular, 40 cm from the incisors. - Erythematous mucosa in  the antrum. Biopsy is contraindicated. - Normal examined duodenum. - No specimens collected. - Scope induced trauma in duodenal bulb causing minimal self limited oozing."  Pt has had Colonoscopy completed on 09/09/2016 with results revealing "- Non-thrombosed external hemorrhoids found on perianal exam. - Internal hemorrhoids. - Diverticulosis in the sigmoid colon. - No source of melena seen; wonder if it is from the gastritis seen on prior endoscopy in setting of anticoagulation."  Most recent lab results (08/28/2018) of CBC is as follows: WBC at 6.8K, RBC at 3.75, Hgb at 9.5, HCT at 31.9, PLTs at 222K, MCV at 85.1, MCH at 25.3, MCHC at 29.8, RDW at 16.8, MPV at 12.5, Neutro Rel at 70.8, Lymphs Rel at 18.1, Monocytes Rel at 7.3, Eosinophils Rel at 3.2, Basophils Rel at 0.6.   On review of systems, pt reports frequent urination, more energy and denies constipation, bowel movement changes, bloody/ black stools and any other symptoms.   On PMHx the pt reports CKD, HTN, Afib, Gout, BPH   MEDICAL HISTORY:  Past Medical History:  Diagnosis Date  . Anemia   . Arthritis    "shoulders" (09/07/2016)  . [place]   . BPH (benign prostatic hyperplasia)   . Chronic kidney disease (CKD), stage III (moderate) (HCC)   . Coronary artery disease   . DDD (degenerative disc disease), cervical   . [place]   LLE  . GERD (gastroesophageal reflux  disease)   . Gout   . High cholesterol   . History of blood transfusion 09/06/2016  . History of scarlet fever 1940s  . History of stress test 06/2011   No significant ischemia, this is a low risk scan. Clinical correlation recommended Abnormal myocardial perfusion study.  Marland Kitchen Hx of echocardiogram 05/2009   EF 40-45%, he did have mild annular calcification with mild-to-moderate MR and mild TR as well as aortic valve sclerosis. Estimated RV systolic pressure was 21 mm.  . Hypertension   . Kidney failure   .  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 Neck pain    "not chronic" (09/07/2016)  . Obesity   . OSA on CPAP   . RBBB   . Renal insufficiency   . Spinal stenosis of lumbar region     SURGICAL HISTORY: Past Surgical History:  Procedure Laterality Date  . APPENDECTOMY    . CARDIAC CATHETERIZATION  2009   he was found to have a atretic LIMA graft to his LAD and had a patent vein graft supplying his diagonal vessel. At that time, he had 70% narrowing in his LAD beyond a diagonal vessel which was initially treated medically.  . COLONOSCOPY WITH PROPOFOL Left 09/09/2016   Procedure: COLONOSCOPY WITH PROPOFOL;  Surgeon: Arta Silence, MD;  Location: [facility];  Service: Endoscopy;  Laterality: Left;  . CORONARY ANGIOPLASTY WITH STENT PLACEMENT  April 2012   LAD DES  . CORONARY ARTERY BYPASS GRAFT  1992   with LIMA to the LAD and diagonal.  . ESOPHAGOGASTRODUODENOSCOPY (EGD) WITH PROPOFOL Left 09/08/2016   Procedure: ESOPHAGOGASTRODUODENOSCOPY (EGD) WITH PROPOFOL;  Surgeon: Ronnette Juniper, MD;  Location: [facility];  Service: Gastroenterology;  Laterality: Left;  . INGUINAL HERNIA REPAIR     "don't remeimber which side"  . PACEMAKER IMPLANT N/A 12/25/2016   Procedure: PACEMAKER IMPLANT;  Surgeon: Deboraha Sprang, MD;  Location: [facility];  Service: Cardiovascular;  Laterality: N/A;  . TONSILLECTOMY      SOCIAL HISTORY: Social History   Socioeconomic History  . Marital status: Married    Spouse name: Not on file  . Number of children: Not on file  . Years of education: Not on file  . Highest education level: Not on file  Occupational History  . Not on file  Social Needs  . Financial resource strain: Not on file  . Food insecurity    Worry: Not on file    Inability: Not on file  . Transportation needs    Medical: Not on file    Non-medical: Not on file  Tobacco Use  . Smoking status: Former Smoker    Packs/day: 1.00    Years: 30.00    Pack years: 30.00     Types: Cigarettes  . Smokeless tobacco: Never Used  . Tobacco comment: quit ~ 1985  Substance and Sexual Activity  . Alcohol use: No    Alcohol/week: 0.0 standard drinks  . Drug use: No  . Sexual activity: Not on file  Lifestyle  . Physical activity    Days per week: Not on file    Minutes per session: Not on file  . Stress: Not on file  Relationships  . Social Herbalist on phone: Not on file    Gets together: Not on file    Attends religious service: Not on file    Active member of club or organization: Not on file    Attends meetings of clubs  or organizations: Not on file    Relationship status: Not on file  . Intimate partner violence    Fear of current or ex partner: Not on file    Emotionally abused: Not on file    Physically abused: Not on file    Forced sexual activity: Not on file  Other Topics Concern  . Not on file  Social History Narrative  . Not on file    FAMILY HISTORY: Family History  Problem Relation Age of Onset  . Heart disease Father     ALLERGIES:  is allergic to nitrostat [nitroglycerin].  MEDICATIONS:  Current Outpatient Medications  Medication Sig Dispense Refill  . acetaminophen (TYLENOL) 500 MG tablet Take 500 mg by mouth every 6 (six) hours as needed for moderate pain.    Marland Kitchen allopurinol (ZYLOPRIM) 300 MG tablet Take 300 mg by mouth daily.    Marland Kitchen amLODipine (NORVASC) 5 MG tablet TAKE 1 & 1/2 TABLETS BY MOUTH DAILY 45 tablet 5  . atorvastatin (LIPITOR) 20 MG tablet TAKE 1 TABLET BY MOUTH DAILY 90 tablet 2  . atorvastatin (LIPITOR) 20 MG tablet TAKE 1 TABLET BY MOUTH DAILY 90 tablet 3  . fenofibrate micronized (LOFIBRA) 134 MG capsule Take 134 mg by mouth daily before breakfast.    . furosemide (LASIX) 40 MG tablet Take 0.5 tablets (20 mg total) by mouth daily.    Marland Kitchen losartan (COZAAR) 50 MG tablet Take 1 tablet by mouth daily.    . metoprolol succinate (TOPROL-XL) 25 MG 24 hr tablet TAKE 1 TABLET BY MOUTH DAILY 30 tablet 0  .  pantoprazole (PROTONIX) 40 MG tablet Take 1 tablet (40 mg total) by mouth daily. 30 tablet 0  . Rivaroxaban (XARELTO) 15 MG TABS tablet Take 1 tablet (15 mg total) by mouth daily with supper. 30 tablet 1  . sildenafil (REVATIO) 20 MG tablet Take 1-3 tablets (20-60 mg total) by mouth daily as needed. 25 tablet 0  . Tamsulosin HCl (FLOMAX) 0.4 MG CAPS Take 1 capsule (0.4 mg total) by mouth daily. 30 capsule 0   No current facility-administered medications for this visit.     REVIEW OF SYSTEMS:    10 Point review of Systems was done is negative except as noted above.  PHYSICAL EXAMINATION: ECOG PERFORMANCE STATUS: 1 - Symptomatic but completely ambulatory  . Vitals:   10/01/18 1023  BP: (!) 165/85  Pulse: 66  Resp: 18  Temp: 97.6 F (36.4 C)  SpO2: 100%   Filed Weights   10/01/18 1023  Weight: 203 lb 8 oz (92.3 kg)   .Body mass index is 32.85 kg/m.   GENERAL:alert, in no acute distress and comfortable SKIN: no acute rashes, no significant lesions EYES: conjunctiva are pink and non-injected, sclera anicteric OROPHARYNX: MMM, no exudates, no oropharyngeal erythema or ulceration NECK: supple, no JVD LYMPH:  no palpable lymphadenopathy in the cervical, axillary or inguinal regions LUNGS: clear to auscultation b/l with normal respiratory effort HEART: regular rate & rhythm ABDOMEN:  normoactive bowel sounds , non tender, not distended. Extremity: no pedal edema PSYCH: alert & oriented x 3 with fluent speech NEURO: no focal motor/sensory deficits  LABORATORY DATA:  I have reviewed the data as listed  . CBC Latest Ref Rng & Units 10/01/2018 10/01/2018 12/21/2016  WBC 4.0 - 10.5 K/uL 7.0 - 5.2  Hemoglobin 13.0 - 17.0 g/dL 12.1(L) - 11.2(L)  Hematocrit 37.5 - 51.0 % 40.7 39.9 35.9(L)  Platelets 150 - 400 K/uL 190 - 141(L)   .  CBC    Component Value Date/Time   WBC 7.0 10/01/2018 1130   RBC 4.47 10/01/2018 1130   HGB 12.1 (L) 10/01/2018 1130   HCT 39.9 10/01/2018 1130    HCT 40.7 10/01/2018 1130   PLT 190 10/01/2018 1130   MCV 91.1 10/01/2018 1130   MCH 27.1 10/01/2018 1130   MCHC 29.7 (L) 10/01/2018 1130   RDW 22.0 (H) 10/01/2018 1130   LYMPHSABS 0.9 10/01/2018 1130   MONOABS 0.5 10/01/2018 1130   EOSABS 0.2 10/01/2018 1130   BASOSABS 0.0 10/01/2018 1130    . CMP Latest Ref Rng & Units 10/01/2018 12/25/2016 12/24/2016  Glucose 70 - 99 mg/dL 114(H) 94 89  BUN 8 - 23 mg/dL 38(H) 34(H) 30(H)  Creatinine 0.61 - 1.24 mg/dL 2.47(H) 2.26(H) 2.12(H)  Sodium 135 - 145 mmol/L 144 142 139  Potassium 3.5 - 5.1 mmol/L 4.5 4.3 3.7  Chloride 98 - 111 mmol/L 108 106 103  CO2 22 - 32 mmol/L _0 Calcium 8.9 - 10.3 mg/dL 9.6 9.5 9.1  Total Protein 6.5 - 8.1 g/dL 7.6 - -  Total Bilirubin 0.3 - 1.2 mg/dL 0.4 - -  Alkaline Phos 38 - 126 U/L 76 - -  AST 15 - 41 U/L 20 - -  ALT 0 - 44 U/L 12 - -    09/09/2016 Colonoscopy    09/08/2016 Upper Endoscopy    RADIOGRAPHIC STUDIES: I have personally reviewed the radiological images as listed and agreed with the findings in the report. No results found.  ASSESSMENT & PLAN:   1) Normocytic Anemia - likely related to CKD and concern for GI bleeding PLAN -Discussed patient's most recent labs from 08/28/2018, WBC at 6.8K, RBC at 3.75, Hgb at 9.5, HCT at 31.9, PLTs at 222K, MCV at 85.1, MCH at 25.3, MCHC at 29.8, RDW at 16.8, MPV at 12.5, Neutro Rel at 70.8, Lymphs Rel at 18.1, Monocytes Rel at 7.3, Eosinophils Rel at 3.2, Basophils Rel at 0.6.  -Advised pt that we may need to lower his Allopurinol given CKD-- recommended to d/w PCP -Will defer to Dr. Shelia Media for additional Gi workup -hgb on rpt today has has improved to 12.1  -Advised that we would like to keep iron levels higher than normal due to his CKD with ferritin goal of atleast >100. Would target ferritin of 250 if anemia with ferritin at 100. -if hgb <9 despite control of GI bleeding and adequate iron replacement -- might need to consider ESA's for CKD  -Will get labs today -Will see back in 2 weeks via phone    FOLLOW UP: Labs today Phone visit with Dr Irene Limbo in 2 weeks  . Orders Placed This Encounter  Procedures  . CBC with Differential/Platelet    Standing Status:   Future    Number of Occurrences:   1    Standing Expiration Date:   11/05/2019  . CMP ([facility] only)    Standing Status:   Future    Number of Occurrences:   1    Standing Expiration Date:   10/01/2019  . Ferritin    Standing Status:   Future    Number of Occurrences:   1    Standing Expiration Date:   10/01/2019  . Iron and TIBC    Standing Status:   Future    Number of Occurrences:   1    Standing Expiration Date:   10/01/2019  . Erythropoietin    Standing Status:   Future  Number of Occurrences:   1    Standing Expiration Date:   10/01/2019  . Multiple Myeloma Panel (SPEP&IFE w/QIG)    Standing Status:   Future    Number of Occurrences:   1    Standing Expiration Date:   10/01/2019  . Kappa/lambda light chains    Standing Status:   Future    Number of Occurrences:   1    Standing Expiration Date:   11/05/2019  . Vitamin B12    Standing Status:   Future    Number of Occurrences:   1    Standing Expiration Date:   10/01/2019  . Folate RBC    Standing Status:   Future    Number of Occurrences:   1    Standing Expiration Date:   10/01/2019  . TSH    Standing Status:   Future    Number of Occurrences:   1    Standing Expiration Date:   10/01/2019     All of the patients questions were answered with apparent satisfaction. The patient knows to call the clinic with any problems, questions or concerns.  I spent 69mns counseling the patient face to face. The total time spent in the appointment was 353ms  and more than 50% was on counseling and direct patient cares.    GaSullivan LoneD MSCamdenAHIVMS SCNewport HospitalTKeokuk County Health Centerematology/Oncology Physician CoNewberry County Memorial Hospital(Office):       [phone] cell):  [phone]):           [phone] 09/30/2018 9:18 PM  I, JaYevette Edwardsam acting as a scribe for Dr. GaSullivan Lone  .I have reviewed the above documentation for accuracy and completeness, and I agree with the above. .GBrunetta GeneraD

## 2018-10-01 ENCOUNTER — Inpatient Hospital Stay: Payer: Medicare Other

## 2018-10-01 ENCOUNTER — Other Ambulatory Visit: Payer: Self-pay

## 2018-10-01 ENCOUNTER — Telehealth: Payer: Self-pay | Admitting: Hematology

## 2018-10-01 ENCOUNTER — Inpatient Hospital Stay: Payer: Medicare Other | Attending: Hematology | Admitting: Hematology

## 2018-10-01 VITALS — BP 165/85 | HR 66 | Temp 97.6°F | Resp 18 | Ht 66.0 in | Wt 203.5 lb

## 2018-10-01 DIAGNOSIS — I4891 Unspecified atrial fibrillation: Secondary | ICD-10-CM | POA: Insufficient documentation

## 2018-10-01 DIAGNOSIS — N189 Chronic kidney disease, unspecified: Secondary | ICD-10-CM | POA: Diagnosis not present

## 2018-10-01 DIAGNOSIS — M109 Gout, unspecified: Secondary | ICD-10-CM | POA: Diagnosis not present

## 2018-10-01 DIAGNOSIS — D649 Anemia, unspecified: Secondary | ICD-10-CM | POA: Insufficient documentation

## 2018-10-01 DIAGNOSIS — E1122 Type 2 diabetes mellitus with diabetic chronic kidney disease: Secondary | ICD-10-CM | POA: Diagnosis not present

## 2018-10-01 DIAGNOSIS — I129 Hypertensive chronic kidney disease with stage 1 through stage 4 chronic kidney disease, or unspecified chronic kidney disease: Secondary | ICD-10-CM | POA: Diagnosis not present

## 2018-10-01 DIAGNOSIS — D509 Iron deficiency anemia, unspecified: Secondary | ICD-10-CM

## 2018-10-01 LAB — CBC WITH DIFFERENTIAL/PLATELET
Abs Immature Granulocytes: 0.04 10*3/uL (ref 0.00–0.07)
Basophils Absolute: 0 10*3/uL (ref 0.0–0.1)
Basophils Relative: 0 %
Eosinophils Absolute: 0.2 10*3/uL (ref 0.0–0.5)
Eosinophils Relative: 3 %
HCT: 40.7 % (ref 39.0–52.0)
Hemoglobin: 12.1 g/dL — ABNORMAL LOW (ref 13.0–17.0)
Immature Granulocytes: 1 %
Lymphocytes Relative: 13 %
Lymphs Abs: 0.9 10*3/uL (ref 0.7–4.0)
MCH: 27.1 pg (ref 26.0–34.0)
MCHC: 29.7 g/dL — ABNORMAL LOW (ref 30.0–36.0)
MCV: 91.1 fL (ref 80.0–100.0)
Monocytes Absolute: 0.5 10*3/uL (ref 0.1–1.0)
Monocytes Relative: 7 %
Neutro Abs: 5.4 10*3/uL (ref 1.7–7.7)
Neutrophils Relative %: 76 %
Platelets: 190 10*3/uL (ref 150–400)
RBC: 4.47 MIL/uL (ref 4.22–5.81)
RDW: 22 % — ABNORMAL HIGH (ref 11.5–15.5)
WBC: 7 10*3/uL (ref 4.0–10.5)
nRBC: 0 % (ref 0.0–0.2)

## 2018-10-01 LAB — CMP (CANCER CENTER ONLY)
ALT: 12 U/L (ref 0–44)
AST: 20 U/L (ref 15–41)
Albumin: 4 g/dL (ref 3.5–5.0)
Alkaline Phosphatase: 76 U/L (ref 38–126)
Anion gap: 12 (ref 5–15)
BUN: 38 mg/dL — ABNORMAL HIGH (ref 8–23)
CO2: 24 mmol/L (ref 22–32)
Calcium: 9.6 mg/dL (ref 8.9–10.3)
Chloride: 108 mmol/L (ref 98–111)
Creatinine: 2.47 mg/dL — ABNORMAL HIGH (ref 0.61–1.24)
GFR, Est AFR Am: 27 mL/min — ABNORMAL LOW (ref >60)
GFR, Estimated: 24 mL/min — ABNORMAL LOW (ref >60)
Glucose, Bld: 114 mg/dL — ABNORMAL HIGH (ref 70–99)
Potassium: 4.5 mmol/L (ref 3.5–5.1)
Sodium: 144 mmol/L (ref 135–145)
Total Bilirubin: 0.4 mg/dL (ref 0.3–1.2)
Total Protein: 7.6 g/dL (ref 6.5–8.1)

## 2018-10-01 LAB — FERRITIN: Ferritin: 45 ng/mL (ref 24–336)

## 2018-10-01 LAB — IRON AND TIBC
Iron: 119 ug/dL (ref 42–163)
Saturation Ratios: 29 % (ref 20–55)
TIBC: 416 ug/dL — ABNORMAL HIGH (ref 202–409)
UIBC: 297 ug/dL (ref 117–376)

## 2018-10-01 LAB — VITAMIN B12: Vitamin B-12: 228 pg/mL (ref 180–914)

## 2018-10-01 LAB — TSH: TSH: 2.149 u[IU]/mL (ref 0.320–4.118)

## 2018-10-01 NOTE — Telephone Encounter (Signed)
Scheduled appt per 9/22 los. ° °Printed calendar and avs. °

## 2018-10-02 LAB — MULTIPLE MYELOMA PANEL, SERUM
Albumin SerPl Elph-Mcnc: 3.8 g/dL (ref 2.9–4.4)
Albumin/Glob SerPl: 1.3 (ref 0.7–1.7)
Alpha 1: 0.3 g/dL (ref 0.0–0.4)
Alpha2 Glob SerPl Elph-Mcnc: 1 g/dL (ref 0.4–1.0)
B-Globulin SerPl Elph-Mcnc: 1 g/dL (ref 0.7–1.3)
Gamma Glob SerPl Elph-Mcnc: 0.8 g/dL (ref 0.4–1.8)
Globulin, Total: 3.1 g/dL (ref 2.2–3.9)
IgA: 103 mg/dL (ref 61–437)
IgG (Immunoglobin G), Serum: 931 mg/dL (ref 603–1613)
IgM (Immunoglobulin M), Srm: 40 mg/dL (ref 15–143)
Total Protein ELP: 6.9 g/dL (ref 6.0–8.5)

## 2018-10-02 LAB — FOLATE RBC
Folate, Hemolysate: 348 ng/mL
Folate, RBC: 872 ng/mL (ref >498)
Hematocrit: 39.9 % (ref 37.5–51.0)

## 2018-10-02 LAB — ERYTHROPOIETIN: Erythropoietin: 20.4 m[IU]/mL — ABNORMAL HIGH (ref 2.6–18.5)

## 2018-10-02 LAB — KAPPA/LAMBDA LIGHT CHAINS
Kappa free light chain: 58.9 mg/L — ABNORMAL HIGH (ref 3.3–19.4)
Kappa, lambda light chain ratio: 2.02 — ABNORMAL HIGH (ref 0.26–1.65)
Lambda free light chains: 29.2 mg/L — ABNORMAL HIGH (ref 5.7–26.3)

## 2018-10-04 DIAGNOSIS — R195 Other fecal abnormalities: Secondary | ICD-10-CM | POA: Diagnosis not present

## 2018-10-04 DIAGNOSIS — D509 Iron deficiency anemia, unspecified: Secondary | ICD-10-CM | POA: Diagnosis not present

## 2018-10-09 ENCOUNTER — Other Ambulatory Visit: Payer: Self-pay | Admitting: Cardiovascular Disease

## 2018-10-09 DIAGNOSIS — N184 Chronic kidney disease, stage 4 (severe): Secondary | ICD-10-CM | POA: Diagnosis not present

## 2018-10-11 NOTE — Progress Notes (Signed)
HEMATOLOGY/ONCOLOGY CLINIC NOTE  Date of Service: 10/16/2018  Patient Care Team: [name], MD as PCP - General (Internal Medicine) Troy Sine, MD as PCP - Cardiology (Cardiology)  CHIEF COMPLAINTS/PURPOSE OF CONSULTATION:  Anemia  HISTORY OF PRESENTING ILLNESS:   Paul Price is a wonderful 78 y.o. male who has been referred to [place] by Dr Shelia Media for evaluation and management of anemia. The pt reports that he is doing well overall.    The pt reports that he does not feel differently than he did 6 months ago except for having improved energy. He has been taking iron pills for 3 weeks and has been feeling a significant boost of energy since. He has routinely done yardwork for hours, which is abnormal for him. Pt takes one iron pill per day with orange juice and has not noticed any stomach upset. He has not had a repeat iron test since Febuary. Pt's stools have been darker since beginning iron but they have not been black. Pt has not noticed any bloody stools but had a stool test about a month ago, which did find blood in his stool.   Pt does not currently have a GI but Dr. Arta Silence performed his colonoscopy and Dr. Ronnette Juniper performed his endoscopy, both in 2018. He has been taking Zarelto 20mg  daily for his Atrial Fibrillation for 3 years. He has been taking Allopurinol for his Gout, which has been severe in the past but is well controlled now. He has been on that for years. He is currently taking Lasix and is no longer taking Amlodipine. Pt reports that he maintains a balanced diet, is becoming more active and has lost 6-7 lbs in the last few weeks. Dr. Emelia Loron is his Nephrologist. Pt experiences frequent urination and believes that he has an enlarged prostate. He is not currently seeing a Urologist.   Pt has had Upper Endoscopy completed on 09/08/2016 with results revealing "- Normal esophagus. - Z-line regular, 40 cm from the incisors. - Erythematous mucosa in the  antrum. Biopsy is contraindicated. - Normal examined duodenum. - No specimens collected. - Scope induced trauma in duodenal bulb causing minimal self limited oozing."  Pt has had Colonoscopy completed on 09/09/2016 with results revealing "- Non-thrombosed external hemorrhoids found on perianal exam. - Internal hemorrhoids. - Diverticulosis in the sigmoid colon. - No source of melena seen; wonder if it is from the gastritis seen on prior endoscopy in setting of anticoagulation."  Most recent lab results (08/28/2018) of CBC is as follows: WBC at 6.8K, RBC at 3.75, Hgb at 9.5, HCT at 31.9, PLTs at 222K, MCV at 85.1, MCH at 25.3, MCHC at 29.8, RDW at 16.8, MPV at 12.5, Neutro Rel at 70.8, Lymphs Rel at 18.1, Monocytes Rel at 7.3, Eosinophils Rel at 3.2, Basophils Rel at 0.6.   On review of systems, pt reports frequent urination, more energy and denies constipation, bowel movement changes, bloody/ black stools and any other symptoms.   On PMHx the pt reports CKD, HTN, Afib, Gout, BPH  INTERVAL HISTORY:   I connected with  Paul Price on 10/16/18 by a telephone and verified that I am speaking with the correct person using two identifiers.   I discussed the limitations of evaluation and management by telemedicine. The patient expressed understanding and agreed to proceed.   Other persons participating in the visit and their role in the encounter:    -Yevette Edwards, Medical Scribe  Patient's location: Home Provider's location:  [facility] [place] is a wonderful 78 y.o. male who is here for evaluation and management of anemia. The patient's last visit with [place] was on 10/01/2018. The pt reports that he is doing well overall.  The pt reports no new concerns. He had an appointment at [facility] and the physician he saw wanted to do a Colonoscopy. His last Colonoscopy and Endoscopy was in 2018. Pt has continued to take his PO Iron once a day with orange juice. He  denies any bloody stools. Pt will see his Nephrologist at the end of this month.  Lab results (10/01/18) of CBC w/diff and CMP is as follows: all values are WNL except for Hgb at 12.1 MCHC at 29.7, RDW at 22.0, Glucose at 114, BUN at 38, Creatinine at 2.47, GFR Est Non Af Am at 24 10/01/2018 Ferritin at 45 10/01/2018 TSH at 2.149 10/01/2018 Vitamin B12 at 228 10/01/2018 Erythropoietin at 20.4 10/01/2018 Folate RBC is as follows: Folate Hemolysate at 348.0, Hematocrit at 39.9, Folate RBC at 872 10/01/2018 Iron and IBC  Is as follows: Iron at 119, TIBC at 416, Sat Ratios at 29, UIBC at 297 10/01/2018 K/L light chains is as follows: Kappa free light chain at 58.9, Lamda free light chains at 29.2, K/L light chain ratio at 2.02 10/01/2018 MMP is as follows: IgG at 931, IgA at 103, IgM at 40, Total Protein ELP at 6.9, Albumin at 3.8, Alpha 1 at 0.3, Alpha2 at 1.0, B-Globulin at 1.0, Gamma Glob at 0.8, M Protein is " Not Observed", Total Globulin at 3.1, Albumin/Glob at 1.3  On review of systems, pt denies bloody stools and any other symptoms.   MEDICAL HISTORY:  Past Medical History:  Diagnosis Date  . Anemia   . Arthritis    "shoulders" (09/07/2016)  . Atrial fibrillation ([place])   . BPH (benign prostatic hyperplasia)   . Chronic kidney disease (CKD), stage III (moderate) (H[facility])   . Coronary artery disease   . DDD (degenerative disc disease), cervical   . DVT (deep venous thrombosis) ([place]) 12/2013   LLE  . GERD (gastroesophageal reflux disease)   . Gout   . High cholesterol   . History of blood transfusion 09/06/2016  . History of scarlet fever 1940s  . History of stress test 06/2011   No significant ischemia, this is a low risk scan. Clinical correlation recommended Abnormal myocardial perfusion study.  Marland Kitchen Hx of echocardiogram 05/2009   EF 40-45%, he did have mild annular calcification with mild-to-moderate MR and mild TR as well as aortic valve sclerosis. Estimated RV systolic pressure was  21 mm.  . Hypertension   . Kidney failure   . 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 Neck pain    "not chronic" (09/07/2016)  . Obesity   . OSA on CPAP   . RBBB   . Renal insufficiency   . Spinal stenosis of lumbar region     SURGICAL HISTORY: Past Surgical History:  Procedure Laterality Date  . APPENDECTOMY    . CARDIAC CATHETERIZATION  2009   he was found to have a atretic LIMA graft to his LAD and had a patent vein graft supplying his diagonal vessel. At that time, he had 70% narrowing in his LAD beyond a diagonal vessel which was initially treated medically.  . COLONOSCOPY WITH PROPOFOL Left 09/09/2016   Procedure: COLONOSCOPY WITH PROPOFOL;  Surgeon: Arta Silence, MD;  Location: [facility];  Service:  Endoscopy;  Laterality: Left;  . CORONARY ANGIOPLASTY WITH STENT PLACEMENT  April 2012   LAD DES  . CORONARY ARTERY BYPASS GRAFT  1992   with LIMA to the LAD and diagonal.  . ESOPHAGOGASTRODUODENOSCOPY (EGD) WITH PROPOFOL Left 09/08/2016   Procedure: ESOPHAGOGASTRODUODENOSCOPY (EGD) WITH PROPOFOL;  Surgeon: Ronnette Juniper, MD;  Location: [facility];  Service: Gastroenterology;  Laterality: Left;  . INGUINAL HERNIA REPAIR     "don't remeimber which side"  . PACEMAKER IMPLANT N/A 12/25/2016   Procedure: PACEMAKER IMPLANT;  Surgeon: Deboraha Sprang, MD;  Location: [facility];  Service: Cardiovascular;  Laterality: N/A;  . TONSILLECTOMY      SOCIAL HISTORY: Social History   Socioeconomic History  . Marital status: Married    Spouse name: Not on file  . Number of children: Not on file  . Years of education: Not on file  . Highest education level: Not on file  Occupational History  . Not on file  Social Needs  . Financial resource strain: Not on file  . Food insecurity    Worry: Not on file    Inability: Not on file  . Transportation needs    Medical: Not on file    Non-medical: Not on file  Tobacco Use  . Smoking status: Former Smoker    Packs/day:  1.00    Years: 30.00    Pack years: 30.00    Types: Cigarettes  . Smokeless tobacco: Never Used  . Tobacco comment: quit ~ 1985  Substance and Sexual Activity  . Alcohol use: No    Alcohol/week: 0.0 standard drinks  . Drug use: No  . Sexual activity: Not on file  Lifestyle  . Physical activity    Days per week: Not on file    Minutes per session: Not on file  . Stress: Not on file  Relationships  . Social Herbalist on phone: Not on file    Gets together: Not on file    Attends religious service: Not on file    Active member of club or organization: Not on file    Attends meetings of clubs or organizations: Not on file    Relationship status: Not on file  . Intimate partner violence    Fear of current or ex partner: Not on file    Emotionally abused: Not on file    Physically abused: Not on file    Forced sexual activity: Not on file  Other Topics Concern  . Not on file  Social History Narrative  . Not on file    FAMILY HISTORY: Family History  Problem Relation Age of Onset  . Heart disease Father     ALLERGIES:  is allergic to nitrostat [nitroglycerin].  MEDICATIONS:  Current Outpatient Medications  Medication Sig Dispense Refill  . acetaminophen (TYLENOL) 500 MG tablet Take 500 mg by mouth every 6 (six) hours as needed for moderate pain.    Marland Kitchen allopurinol (ZYLOPRIM) 300 MG tablet Take 300 mg by mouth daily.    Marland Kitchen atorvastatin (LIPITOR) 20 MG tablet TAKE 1 TABLET BY MOUTH DAILY (Patient not taking: Reported on 10/02/2018) 90 tablet 2  . atorvastatin (LIPITOR) 20 MG tablet Take 1 tablet (20 mg total) by mouth daily. *NEEDS OFFICE VISIT FOR FURTHER REFILLS* 30 tablet 0  . fenofibrate micronized (LOFIBRA) 134 MG capsule Take 134 mg by mouth daily before breakfast.    . furosemide (LASIX) 40 MG tablet Take 0.5 tablets (20 mg total) by mouth  daily.    . IRON, FERROUS SULFATE, PO Take by mouth daily.    Marland Kitchen losartan (COZAAR) 50 MG tablet Take 1 tablet by mouth  daily.    . metoprolol succinate (TOPROL-XL) 25 MG 24 hr tablet TAKE 1 TABLET BY MOUTH DAILY 30 tablet 0  . omeprazole (PRILOSEC) 20 MG capsule Take 20 mg by mouth daily.    . Rivaroxaban (XARELTO) 15 MG TABS tablet Take 1 tablet (15 mg total) by mouth daily with supper. 30 tablet 1  . sildenafil (REVATIO) 20 MG tablet Take 1-3 tablets (20-60 mg total) by mouth daily as needed. (Patient not taking: Reported on 10/02/2018) 25 tablet 0  . Tamsulosin HCl (FLOMAX) 0.4 MG CAPS Take 1 capsule (0.4 mg total) by mouth daily. 30 capsule 0   No current facility-administered medications for this visit.     REVIEW OF SYSTEMS:    10 Point review of Systems was done is negative except as noted above.  PHYSICAL EXAMINATION: ECOG PERFORMANCE STATUS: 1 - Symptomatic but completely ambulatory  . There were no vitals filed for this visit. There were no vitals filed for this visit. .There is no height or weight on file to calculate BMI.   Phone visit  LABORATORY DATA:  I have reviewed the data as listed  . CBC Latest Ref Rng & Units 10/01/2018 10/01/2018 12/21/2016  WBC 4.0 - 10.5 K/uL 7.0 - 5.2  Hemoglobin 13.0 - 17.0 g/dL 12.1(L) - 11.2(L)  Hematocrit 37.5 - 51.0 % 40.7 39.9 35.9(L)  Platelets 150 - 400 K/uL 190 - 141(L)   . CBC    Component Value Date/Time   WBC 7.0 10/01/2018 1130   RBC 4.47 10/01/2018 1130   HGB 12.1 (L) 10/01/2018 1130   HCT 39.9 10/01/2018 1130   HCT 40.7 10/01/2018 1130   PLT 190 10/01/2018 1130   MCV 91.1 10/01/2018 1130   MCH 27.1 10/01/2018 1130   MCHC 29.7 (L) 10/01/2018 1130   RDW 22.0 (H) 10/01/2018 1130   LYMPHSABS 0.9 10/01/2018 1130   MONOABS 0.5 10/01/2018 1130   EOSABS 0.2 10/01/2018 1130   BASOSABS 0.0 10/01/2018 1130    . CMP Latest Ref Rng & Units 10/01/2018 12/25/2016 12/24/2016  Glucose 70 - 99 mg/dL 114(H) 94 89  BUN 8 - 23 mg/dL 38(H) 34(H) 30(H)  Creatinine 0.61 - 1.24 mg/dL 2.47(H) 2.26(H) 2.12(H)  Sodium 135 - 145 mmol/L 144 142 139   Potassium 3.5 - 5.1 mmol/L 4.5 4.3 3.7  Chloride 98 - 111 mmol/L 108 106 103  CO2 22 - 32 mmol/L 24 27 26   Calcium 8.9 - 10.3 mg/dL 9.6 9.5 9.1  Total Protein 6.5 - 8.1 g/dL 7.6 - -  Total Bilirubin 0.3 - 1.2 mg/dL 0.4 - -  Alkaline Phos 38 - 126 U/L 76 - -  AST 15 - 41 U/L 20 - -  ALT 0 - 44 U/L 12 - -   . Lab Results  Component Value Date   IRON 119 10/01/2018   TIBC 416 (H) 10/01/2018   IRONPCTSAT 29 10/01/2018   (Iron and TIBC)  Lab Results  Component Value Date   FERRITIN 45 10/01/2018     09/09/2016 Colonoscopy    09/08/2016 Upper Endoscopy    RADIOGRAPHIC STUDIES: I have personally reviewed the radiological images as listed and agreed with the findings in the report. No results found.  ASSESSMENT & PLAN:   1) Normocytic Anemia - likely related to CKD and concern for GI bleeding  PLAN: -  Discussed pt labwork, 10/01/18; all values are WNL except for Hgb at 12.1 MCHC at 29.7, RDW at 22.0, Glucose at 114, BUN at 38, Creatinine at 2.47, GFR Est Non Af Am at 24 -Discussed 10/01/2018 Ferritin at 45 - has improved from 02/15/2018 Ferritin at 30  -Discussed 10/01/2018 TSH at 2.149 -Discussed 10/01/2018 Vitamin B12 at 228 -Discussed 10/01/2018 Erythropoietin at 20.4 -Discussed 10/01/2018 Folate RBC is as follows: Folate Hemolysate at 348.0, Hematocrit at 39.9, Folate RBC at 872 -Discussed 10/01/2018 Iron and IBC  Is as follows: Iron at 119, TIBC at 416, Sat Ratios at 29, UIBC at 297 - has improved from 02/11/2018 Sat Ratios at 6 -Discussed 10/01/2018 K/L light chains is as follows: Kappa free light chain at 58.9, Lamda free light chains at 29.2, K/L light chain ratio at 2.02 -Discussed 10/01/2018 MMP is as follows: IgG at 931, IgA at 103, IgM at 40, Total Protein ELP at 6.9, Albumin at 3.8, Alpha 1 at 0.3, Alpha2 at 1.0, B-Globulin at 1.0, Gamma Glob at 0.8, M Protein is " Not Observed", Total Globulin at 3.1, Albumin/Glob at 1.3 -If hgb <9 despite control of GI bleeding  and adequate iron replacement -- might need to consider ESA's for CKD -Due to CKD, Goal Ferritin >100; Would target ferritin of 250 if anemia with ferritin at 100. -Discussed IV Iron vs PO Iron  -Pt will continue PO Iron BID for now  -Will consider IV Iron in 3 months, based on labs  -Recommend taking a B-complex vitamin daily  -Advised pt to discuss use of Protonix with GI -Advised pt that it wouldn't be unreasonable to get another Colonoscopy to check for GI bleeding -Advised pt that we may need to lower his Allopurinol given CKD-- recommended to d/w PCP -Will see back with labs in 3 months  FOLLOW UP: RTC with Dr Irene Limbo with labs in 3 months  The total time spent in the appt was 20 minutes and more than 50% was on counseling and direct patient cares.  All of the patient's questions were answered with apparent satisfaction. The patient knows to call the clinic with any problems, questions or concerns.   Sullivan Lone MD [place] AAHIVMS [facility] Hematology/Oncology Physician [facility]  (Office):       [phone] (Work cell):  [phone] (Fax):           [phone]  10/16/2018 12:08 PM  I, Yevette Edwards, am acting as a scribe for Dr. Sullivan Lone.   .I have reviewed the above documentation for accuracy and completeness, and I agree with the above. Brunetta Genera MD

## 2018-10-15 ENCOUNTER — Telehealth: Payer: Self-pay | Admitting: Hematology

## 2018-10-15 NOTE — Telephone Encounter (Signed)
Confirmed telephone f/u on 10/7 and verified pt's demographics

## 2018-10-16 ENCOUNTER — Inpatient Hospital Stay: Payer: Medicare Other | Attending: Hematology | Admitting: Hematology

## 2018-10-16 ENCOUNTER — Telehealth: Payer: Self-pay | Admitting: Hematology

## 2018-10-16 DIAGNOSIS — D509 Iron deficiency anemia, unspecified: Secondary | ICD-10-CM

## 2018-10-16 DIAGNOSIS — E538 Deficiency of other specified B group vitamins: Secondary | ICD-10-CM | POA: Diagnosis not present

## 2018-10-16 NOTE — Telephone Encounter (Signed)
Scheduled appt per 10/7 los.  Sent a staff message to get a calendar mailed out.

## 2018-10-25 ENCOUNTER — Ambulatory Visit (INDEPENDENT_AMBULATORY_CARE_PROVIDER_SITE_OTHER): Payer: Medicare Other | Admitting: *Deleted

## 2018-10-25 DIAGNOSIS — R55 Syncope and collapse: Secondary | ICD-10-CM

## 2018-10-25 DIAGNOSIS — I5032 Chronic diastolic (congestive) heart failure: Secondary | ICD-10-CM

## 2018-10-26 ENCOUNTER — Other Ambulatory Visit: Payer: Self-pay | Admitting: Cardiovascular Disease

## 2018-10-28 LAB — CUP PACEART REMOTE DEVICE CHECK
Battery Remaining Longevity: 106 mo
Battery Voltage: 2.94 V
Brady Statistic AP VP Percent: 29.25 %
Brady Statistic AP VS Percent: 5.42 %
Brady Statistic AS VP Percent: 61.16 %
Brady Statistic AS VS Percent: 4.17 %
Brady Statistic RA Percent Paced: 34.75 %
Brady Statistic RV Percent Paced: 90.41 %
Date Time Interrogation Session: 20201018000730
Implantable Lead Implant Date: 20181217
Implantable Lead Implant Date: 20181217
Implantable Lead Location: 753859
Implantable Lead Location: 753859
Implantable Lead Model: 3830
Implantable Lead Model: 5076
Implantable Pulse Generator Implant Date: 20181217
Lead Channel Impedance Value: 361 Ohm
Lead Channel Impedance Value: 361 Ohm
Lead Channel Impedance Value: 456 Ohm
Lead Channel Impedance Value: 475 Ohm
Lead Channel Pacing Threshold Amplitude: 0.5 V
Lead Channel Pacing Threshold Amplitude: 0.75 V
Lead Channel Pacing Threshold Pulse Width: 0.4 ms
Lead Channel Pacing Threshold Pulse Width: 0.4 ms
Lead Channel Sensing Intrinsic Amplitude: 1.375 mV
Lead Channel Sensing Intrinsic Amplitude: 1.375 mV
Lead Channel Sensing Intrinsic Amplitude: 11.5 mV
Lead Channel Sensing Intrinsic Amplitude: 11.5 mV
Lead Channel Setting Pacing Amplitude: 2 V
Lead Channel Setting Pacing Amplitude: 2.5 V
Lead Channel Setting Pacing Pulse Width: 1 ms
Lead Channel Setting Sensing Sensitivity: 1.2 mV

## 2018-10-30 NOTE — Progress Notes (Signed)
Remote pacemaker transmission.   

## 2018-11-02 ENCOUNTER — Other Ambulatory Visit: Payer: Self-pay | Admitting: Cardiovascular Disease

## 2018-11-04 DIAGNOSIS — I10 Essential (primary) hypertension: Secondary | ICD-10-CM | POA: Diagnosis not present

## 2018-11-04 DIAGNOSIS — N184 Chronic kidney disease, stage 4 (severe): Secondary | ICD-10-CM | POA: Diagnosis not present

## 2018-11-04 DIAGNOSIS — N2581 Secondary hyperparathyroidism of renal origin: Secondary | ICD-10-CM | POA: Diagnosis not present

## 2018-11-04 DIAGNOSIS — D631 Anemia in chronic kidney disease: Secondary | ICD-10-CM | POA: Diagnosis not present

## 2018-11-04 DIAGNOSIS — N179 Acute kidney failure, unspecified: Secondary | ICD-10-CM | POA: Diagnosis not present

## 2018-11-05 ENCOUNTER — Other Ambulatory Visit: Payer: Self-pay | Admitting: Cardiovascular Disease

## 2018-11-12 ENCOUNTER — Other Ambulatory Visit: Payer: Self-pay | Admitting: Nephrology

## 2018-11-12 DIAGNOSIS — N179 Acute kidney failure, unspecified: Secondary | ICD-10-CM

## 2018-11-12 DIAGNOSIS — N184 Chronic kidney disease, stage 4 (severe): Secondary | ICD-10-CM

## 2018-11-12 DIAGNOSIS — M7989 Other specified soft tissue disorders: Secondary | ICD-10-CM | POA: Diagnosis not present

## 2018-11-12 DIAGNOSIS — G4733 Obstructive sleep apnea (adult) (pediatric): Secondary | ICD-10-CM | POA: Diagnosis not present

## 2018-11-25 ENCOUNTER — Other Ambulatory Visit: Payer: Self-pay | Admitting: Cardiovascular Disease

## 2018-11-25 DIAGNOSIS — N390 Urinary tract infection, site not specified: Secondary | ICD-10-CM | POA: Diagnosis not present

## 2018-11-25 DIAGNOSIS — I10 Essential (primary) hypertension: Secondary | ICD-10-CM | POA: Diagnosis not present

## 2018-12-03 ENCOUNTER — Other Ambulatory Visit: Payer: Medicare Other

## 2018-12-07 ENCOUNTER — Other Ambulatory Visit: Payer: Self-pay | Admitting: Cardiovascular Disease

## 2018-12-09 ENCOUNTER — Ambulatory Visit
Admission: RE | Admit: 2018-12-09 | Discharge: 2018-12-09 | Disposition: A | Discharge status: Home / Self Care | Payer: Medicare Other | Source: Ambulatory Visit | Attending: Nephrology | Admitting: Nephrology

## 2018-12-09 DIAGNOSIS — N179 Acute kidney failure, unspecified: Secondary | ICD-10-CM

## 2018-12-09 DIAGNOSIS — N184 Chronic kidney disease, stage 4 (severe): Secondary | ICD-10-CM | POA: Diagnosis not present

## 2018-12-10 DIAGNOSIS — G4733 Obstructive sleep apnea (adult) (pediatric): Secondary | ICD-10-CM | POA: Diagnosis not present

## 2018-12-12 ENCOUNTER — Other Ambulatory Visit: Payer: Self-pay

## 2018-12-12 ENCOUNTER — Encounter: Payer: Self-pay | Admitting: Internal Medicine

## 2018-12-12 ENCOUNTER — Ambulatory Visit: Payer: Medicare Other | Admitting: Internal Medicine

## 2018-12-12 VITALS — BP 150/78 | HR 69 | Ht 66.0 in | Wt 205.6 lb

## 2018-12-12 DIAGNOSIS — I441 Atrioventricular block, second degree: Secondary | ICD-10-CM

## 2018-12-12 DIAGNOSIS — I495 Sick sinus syndrome: Secondary | ICD-10-CM | POA: Diagnosis not present

## 2018-12-12 DIAGNOSIS — R001 Bradycardia, unspecified: Secondary | ICD-10-CM

## 2018-12-12 MED ORDER — AMLODIPINE BESYLATE 2.5 MG PO TABS: 2.5000 mg | ORAL_TABLET | Freq: Every day | ORAL | 3 refills | Status: DC

## 2018-12-12 NOTE — Progress Notes (Signed)
Patient Care Team: [name], MD as PCP - General (Internal Medicine) Troy Sine, MD as PCP - Cardiology (Cardiology)   HPI  Paul Price is a 78 y.o. 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     He has a history of ischemic heart disease with prior bypass surgery   Paroxysmal atrial fibrillation and is anticoagulated with Xarelto.  He is appropriately dosed given his renal insufficiency.  No bleeding  The patient denies chest pain, shortness of breath, nocturnal dyspnea, orthopnea or peripheral edema.  There have been no palpitations, lightheadedness or syncope.    DATE TEST EF   4/18 MYOVIEW  47 % Small scar w/o ischemia   12/18 Echo   40-45% % LVH mod          Date Cr K Hgb  12/18 2.26  11.2  11/19 1.85 4.2 10.7  9/20 2.47 4.5 12.1     Records and Results Reviewed   Past Medical History:  Diagnosis Date  . Anemia   . Arthritis    "shoulders" (09/07/2016)  . Atrial fibrillation ([place])   . BPH (benign prostatic hyperplasia)   . Chronic kidney disease (CKD), stage III (moderate)   . Coronary artery disease   . DDD (degenerative disc disease), cervical   . DVT (deep venous thrombosis) ([place]) 12/2013   LLE  . GERD (gastroesophageal reflux disease)   . Gout   . High cholesterol   . History of blood transfusion 09/06/2016  . History of scarlet fever 1940s  . History of stress test 06/2011   No significant ischemia, this is a low risk scan. Clinical correlation recommended Abnormal myocardial perfusion study.  Marland Kitchen Hx of echocardiogram 05/2009   EF 40-45%, he did have mild annular calcification with mild-to-moderate MR and mild TR as well as aortic valve sclerosis. Estimated RV systolic pressure was 21 mm.  . Hypertension   . Kidney failure   . 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 Neck pain    "not chronic" (09/07/2016)  . Obesity   . OSA on CPAP   . RBBB   . Renal insufficiency   . Spinal  stenosis of lumbar region     Past Surgical History:  Procedure Laterality Date  . APPENDECTOMY    . CARDIAC CATHETERIZATION  2009   he was found to have a atretic LIMA graft to his LAD and had a patent vein graft supplying his diagonal vessel. At that time, he had 70% narrowing in his LAD beyond a diagonal vessel which was initially treated medically.  . COLONOSCOPY WITH PROPOFOL Left 09/09/2016   Procedure: COLONOSCOPY WITH PROPOFOL;  Surgeon: Arta Silence, MD;  Location: [facility];  Service: Endoscopy;  Laterality: Left;  . CORONARY ANGIOPLASTY WITH STENT PLACEMENT  April 2012   LAD DES  . CORONARY ARTERY BYPASS GRAFT  1992   with LIMA to the LAD and diagonal.  . ESOPHAGOGASTRODUODENOSCOPY (EGD) WITH PROPOFOL Left 09/08/2016   Procedure: ESOPHAGOGASTRODUODENOSCOPY (EGD) WITH PROPOFOL;  Surgeon: Ronnette Juniper, MD;  Location: [facility];  Service: Gastroenterology;  Laterality: Left;  . INGUINAL HERNIA REPAIR     "don't remeimber which side"  . PACEMAKER IMPLANT N/A 12/25/2016   Procedure: PACEMAKER IMPLANT;  Surgeon: Deboraha Sprang, MD;  Location: [facility];  Service: Cardiovascular;  Laterality: N/A;  . TONSILLECTOMY      Current Outpatient Medications  Medication Sig Dispense Refill  .  acetaminophen (TYLENOL) 500 MG tablet Take 500 mg by mouth every 6 (six) hours as needed for moderate pain.    Marland Kitchen allopurinol (ZYLOPRIM) 300 MG tablet Take 300 mg by mouth daily.    Marland Kitchen atorvastatin (LIPITOR) 20 MG tablet TAKE 1 TABLET BY MOUTH DAILY 90 tablet 2  . fenofibrate micronized (LOFIBRA) 134 MG capsule Take 134 mg by mouth daily before breakfast.    . furosemide (LASIX) 40 MG tablet Take 0.5 tablets (20 mg total) by mouth daily.    . IRON, FERROUS SULFATE, PO Take by mouth daily.    Marland Kitchen losartan (COZAAR) 50 MG tablet Take 1 tablet by mouth daily.    . metoprolol succinate (TOPROL-XL) 25 MG 24 hr tablet TAKE 1 TABLET BY MOUTH DAILY 30 tablet 0  . omeprazole (PRILOSEC) 20 MG capsule  Take 20 mg by mouth daily.    . Rivaroxaban (XARELTO) 15 MG TABS tablet Take 1 tablet (15 mg total) by mouth daily with supper. 30 tablet 1  . sildenafil (REVATIO) 20 MG tablet Take 1-3 tablets (20-60 mg total) by mouth daily as needed. 25 tablet 0  . Tamsulosin HCl (FLOMAX) 0.4 MG CAPS Take 1 capsule (0.4 mg total) by mouth daily. 30 capsule 0   No current facility-administered medications for this visit.     Allergies  Allergen Reactions  . Nitrostat [Nitroglycerin] Other (See Comments)    Causes blood pressure to "bottom out"      Review of Systems negative except from HPI and PMH  Physical Exam BP (!) 150/78   Pulse 69   Ht 5\' 6"  (1.676 m)   Wt 205 lb 9.6 oz (93.3 kg)   SpO2 98%   BMI 33.18 kg/m  Well developed and well nourished in no acute distress HENT normal Neck supple with JVP-flat Clear Device pocket well healed; without hematoma or erythema.  There is no tethering  Regular rate and rhythm, no  murmur Abd-soft with active BS No Clubbing cyanosis   edema Skin-warm and dry A & Oriented  Grossly normal sensory and motor function  ECG sinus with P synchronous pacing     Assessment and  Plan   Sinus exit block with symptomatic bradycardia  Right bundle branch block  Pacemaker Medtronic The patient's device was interrogated.  The information was reviewed. No changes were made in the programming.    Ischemic heart disease with prior bypass ejection fraction 40-45%  Hypertension  syncope  Acute chronic systolic/diastolic heart failure  Renal Insufficiency  Gd  4  Interval atrial fibrillation-spontaneously terminating.  On anticoagulation without bleeding.  Euvolemic continue current meds  Blood pressure is elevated.  We will resume his amlodipine at 2.5 mg.  I will defer to Dr. Davonna Belling regarding whether would be appropriate to stop his losartan and use alternative antihypertensives.  On Anticoagulation;  No bleeding issues    We will plan  to reecho him next year.  Pacing always, I worry about the potential for development pacemaker associated cardiomyopathy.  No symptoms to suggest at present  Renal function is somewhat worse.  Current medicines are reviewed at length with the patient today .  The patient does not  have concerns regarding medicines.

## 2018-12-12 NOTE — Patient Instructions (Addendum)
Medication Instructions:  Resume Amlodipine, 2.5mg  daily  Labwork: None ordered.  Testing/Procedures: You will need an echo prior to your appointment next December.  Follow-Up: Your physician recommends that you schedule a follow-up appointment in:   12 months with Dr. Caryl Comes  Any Other Special Instructions Will Be Listed Below (If Applicable).     If you need a refill on your cardiac medications before your next appointment, please call your pharmacy.

## 2018-12-23 ENCOUNTER — Other Ambulatory Visit: Payer: Self-pay | Admitting: Cardiovascular Disease

## 2019-01-07 DIAGNOSIS — N184 Chronic kidney disease, stage 4 (severe): Secondary | ICD-10-CM | POA: Diagnosis not present

## 2019-01-07 DIAGNOSIS — N390 Urinary tract infection, site not specified: Secondary | ICD-10-CM | POA: Diagnosis not present

## 2019-01-10 DIAGNOSIS — G4733 Obstructive sleep apnea (adult) (pediatric): Secondary | ICD-10-CM | POA: Diagnosis not present

## 2019-01-13 ENCOUNTER — Ambulatory Visit: Payer: Medicare Other | Admitting: Cardiovascular Disease

## 2019-01-13 ENCOUNTER — Other Ambulatory Visit: Payer: Self-pay

## 2019-01-13 ENCOUNTER — Encounter: Payer: Self-pay | Admitting: Cardiovascular Disease

## 2019-01-13 DIAGNOSIS — R001 Bradycardia, unspecified: Secondary | ICD-10-CM | POA: Diagnosis not present

## 2019-01-13 DIAGNOSIS — I25810 Atherosclerosis of coronary artery bypass graft(s) without angina pectoris: Secondary | ICD-10-CM | POA: Diagnosis not present

## 2019-01-13 DIAGNOSIS — Z9989 Dependence on other enabling machines and devices: Secondary | ICD-10-CM

## 2019-01-13 DIAGNOSIS — I480 Paroxysmal atrial fibrillation: Secondary | ICD-10-CM

## 2019-01-13 DIAGNOSIS — I495 Sick sinus syndrome: Secondary | ICD-10-CM | POA: Diagnosis not present

## 2019-01-13 DIAGNOSIS — G4733 Obstructive sleep apnea (adult) (pediatric): Secondary | ICD-10-CM

## 2019-01-13 DIAGNOSIS — I10 Essential (primary) hypertension: Secondary | ICD-10-CM | POA: Diagnosis not present

## 2019-01-13 DIAGNOSIS — Z951 Presence of aortocoronary bypass graft: Secondary | ICD-10-CM

## 2019-01-13 DIAGNOSIS — Z7901 Long term (current) use of anticoagulants: Secondary | ICD-10-CM

## 2019-01-13 DIAGNOSIS — N184 Chronic kidney disease, stage 4 (severe): Secondary | ICD-10-CM

## 2019-01-13 DIAGNOSIS — I4510 Unspecified right bundle-branch block: Secondary | ICD-10-CM

## 2019-01-13 MED ORDER — AMLODIPINE BESYLATE 5 MG PO TABS: 7.5000 mg | ORAL_TABLET | Freq: Every day | ORAL | 3 refills | Status: DC

## 2019-01-13 NOTE — Progress Notes (Signed)
Patient ID: Paul Price, male   DOB: 01-14-1940, 79 y.o.   MRN: [id number]     Primary MD:  Paul Price  HPI: Paul Price is a 79 y.o. male who presents for an 41 month cardiology followup evaluation.   Paul Price has established CAD and in 1992 underwent CABG revascularization surgery LIMA to the LAD and diagonal. In 2009 he was found to have an atretic LIMA graft to the LAD and a patent vein graft supplying the diagonal vessel. He had 70% narrowing in the LAD beyond the diagonal was treated medically. In April 2012 due to progressive chest pain and scintigraphic evidence for ischemia in the mid distal LAD territory repeat catheterization was performed and he underwent successful stenting to his LAD beyond the diagonal with a 3.0x20 mm Promus DES stent postdilated 3.25 mm. Subsequent, he has remained active and has done well. His last stress test in 2013 showed an apical defect without ischemia.  Additional problems include obstructive sleep apnea on CPAP therapy, moderate obesity, hypertension, history of DVT, and hyperlipidemia.  He sees Paul Price for primary care.  On November 19, 2012 he saw Paul Price.PAC for suspected atrial fibrillation and CHF after having been seen by his primary physician and noted some increasing shortness of breath with activity. A CardioNet monitor  demonstrated  a 4.3 second pause which led to a reduction in his  beta blocker dose  from Lopressor 75 mg twice a day to 25 twice a day. He saw Paul Price on 12/02/2012 at which time he was in sinus rhythm but also was found to have probably 9 beat run of nonsustained VT on monitor which is he was maintained on low-dose beta blocker therapy. Subsequent cardiac monitoring did not show any further episodes of significant bradycardia but he did have one additional 9 beat run of nonsustained PSVT at a rate of 109 beats per minute on 12/17/12. . Laboratory done by Paul Price on 11/27/2012 showed a potassium of 4.2. His  creatinine was 1.82. BUN 33.  An echo Doppler study done 12/09/2012 showed an estimated ejection fraction at 50-55%. Although no diagnostic regional wall motion abnormalities were definitively identified, this possibility was not completely excluded. There was grade 2 diastolic dysfunction. There is mild left atrial dilatation and mild mitral regurgitation.  A renal ultrasound has  demonstrate bilateral small renal cysts.    In 2015 he was found to have a DVT in his left leg and was started on eliquis by Paul Price.  He was hospitalized on November 23 through 12/09/2013 and presented with fevers, and sudden onset of severe back pain.  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  Blood cultures were negative.   After 1 week in the hospital, he spent 3 weeks at rehabilitation.  He was treated with 4 weeks of antibiotic therapy and after his hospitalization.  He was followed by Dr. [name] from infectious disease.  He last saw him in March 2016 and att that time he was felt to be completely cleared cured.    He has renal insufficiency and has been followed by Paul Price since he had developed acute on chronic kidney injury.  His serum creatinine peaked at 2.8.  Ultimately, this has improved.    When I  saw him in follow-up, he continued to note lower extremity edema. He was no longer anticoagulation but has been on Plavix and aspirin.  I scheduled him for follow-up lower extremity venous ultrasound  which showed resolution of his left distal femoral vein and popliteal vein thrombus . His blood pressure has been treated with Toprol-XL 50 mg losartan 100 mg furosemide 40 mg and amlodipine 10 mg.  He is on lipid lowering therapy with atorvastatin.  He takes allopurinol for gout.  There is history of GERD treated with ranitidine.    He admits to using his CPAP with 100% compliance.  He states he has a new machine.  However, recently he has been noticing frequent awakenings.  He denies  awareness of breakthrough snoring.    When I last saw him, he noticed a change in symptomatology with more progressive exertional dyspnea.  He denied any exertional chest pressure.  However, he has noticed some nonexertional constant chest wall ache.  His shortness of breath was occurring with less activity.  He is unaware of palpitations.  He denies presyncope or syncope.  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   An echo Doppler study on 04/28/2016 showed an EF of 50-55%.  There was grade 2 diastolic dysfunction.  Septal motion.  She showed paradoxical motion.  The left atrium was severely dilated.  There was mild MR.  The RV was mildly dilated, as was the right atrium.  There was mild palmar hypertension with PA pressure 33.  He underwent a nuclear perfusion study to further evaluate his symptoms.  This remained low risk.  Although calculated at 47% EF, visually it appeared to be 55%.  There was a small nonreversible defect in the mid anterior and apical location without ischemia.  He also underwent lower extremity arterial Doppler studies which showed normal ABIs bilaterally and there was no evidence for segmental lower extremity arterial disease. Creatinine was 1.76 on 04/14/2016; Hemoglobin A1c was increased at 6.7.  Lipid studies revealed a TC 128, triglyceride 152, LDL 66, and he has a low HDL level of 32.    He was hospitalized on July 31 through 08/10/2016 and was found to have atrial fibrillation with rapid ventricular response as well as acute renal failure.  His creatinine had risen to 2.9 and improved to 1.9.  With gentle hydration.  His losartan and spironolactone were held.  He was started on Xarelto at discharge.  On August 29 through 09/09/2016.  He was rehospitalized with GI bleed and acute blood loss anemia.  He underwent endoscopy and colonoscopy and GI recommended continuation of anticoagulation with  concomitant Protonix therapy.  He tells me he received 3 units of blood.  During his initial hospitalization on August 1.  An echo Doppler study showed an EF of 45-50% with diffuse hypocontractility.  There was aortic sclerosis without stenosis, mitral annular calcification, and mild LA dilation.  He can 10.  Used to use CPAP.   He was hospitalized in December 2018 for dyspnea on exertion accompanied with lightheadedness.  He was noted to have Mobitz type I heart block during his hospitalization and beta-blocker was discontinued due to suspicion of sick sinus syndrome.  He was ultimately evaluated by Paul Price and underwent pacemaker implantation on December 18.  Subsequently, he has not had any recurrent syncope or presyncope.  A pacemaker device check in March 2019 showed normal device function.  I last saw him in June 2019.  Since his pacemaker insertion he denied any recurrent episodes of dizziness.  His weakness has resolved and shortness of breath is improved.  He continues to use CPAP with 100% compliance.  He is unaware of any atrial fibrillation.  He admits to occasional leg swelling.  He denies any chest pain.    Since I last saw him, he has seen Paul Price on December 12, 2018 in follow-up of his pacemaker.  Apparently, for some time prior to that evaluation his amlodipine which had been 7.5 mg was discontinued by Paul Price due to his concern according to the patient that his blood pressure was getting low.  When evaluated by Paul Price in December, blood pressure was elevated at 150/78.  He was advised to reinstitute amlodipine at 2.5 mg.  However, the patient has subsequently increase this to 5 mg due to continued blood pressure elevations at home with typical blood pressures greater than 725 systolically.  He has continued to be on anticoagulation therapy with Xarelto with his history of PAF and is on a reduced dose with his renal insufficiency.  He is followed by Paul Price for stage IV  chronic kidney disease with creatinine 2.39 in November 2020.  He denies any chest pain PND orthopnea.  He presents for evaluation.  Past Medical History:  Diagnosis Date  . Anemia   . Arthritis    "shoulders" (09/07/2016)  . Atrial fibrillation ([place])   . BPH (benign prostatic hyperplasia)   . Chronic kidney disease (CKD), stage III (moderate)   . Coronary artery disease   . DDD (degenerative disc disease), cervical   . DVT (deep venous thrombosis) ([place]) 12/2013   LLE  . GERD (gastroesophageal reflux disease)   . Gout   . High cholesterol   . History of blood transfusion 09/06/2016  . History of scarlet fever 1940s  . History of stress test 06/2011   No significant ischemia, this is a low risk scan. Clinical correlation recommended Abnormal myocardial perfusion study.  Marland Kitchen Hx of echocardiogram 05/2009   EF 40-45%, he did have mild annular calcification with mild-to-moderate MR and mild TR as well as aortic valve sclerosis. Estimated RV systolic pressure was 21 mm.  . Hypertension   . Kidney failure   . 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 Neck pain    "not chronic" (09/07/2016)  . Obesity   . OSA on CPAP   . RBBB   . Renal insufficiency   . Spinal stenosis of lumbar region     Past Surgical History:  Procedure Laterality Date  . APPENDECTOMY    . CARDIAC CATHETERIZATION  2009   he was found to have a atretic LIMA graft to his LAD and had a patent vein graft supplying his diagonal vessel. At that time, he had 70% narrowing in his LAD beyond a diagonal vessel which was initially treated medically.  . COLONOSCOPY WITH PROPOFOL Left 09/09/2016   Procedure: COLONOSCOPY WITH PROPOFOL;  Surgeon: Arta Silence, MD;  Location: [facility];  Service: Endoscopy;  Laterality: Left;  . CORONARY ANGIOPLASTY WITH STENT PLACEMENT  April 2012   LAD DES  . CORONARY ARTERY BYPASS GRAFT  1992   with LIMA to the LAD and diagonal.  . ESOPHAGOGASTRODUODENOSCOPY (EGD) WITH PROPOFOL Left  09/08/2016   Procedure: ESOPHAGOGASTRODUODENOSCOPY (EGD) WITH PROPOFOL;  Surgeon: Ronnette Juniper, MD;  Location: [facility];  Service: Gastroenterology;  Laterality: Left;  . INGUINAL HERNIA REPAIR     "don't remeimber which side"  . PACEMAKER IMPLANT N/A 12/25/2016   Procedure: PACEMAKER IMPLANT;  Surgeon: Deboraha Sprang, MD;  Location: [facility];  Service: Cardiovascular;  Laterality: N/A;  . TONSILLECTOMY      Allergies  Allergen Reactions  . Nitrostat [Nitroglycerin] Other (See Comments)    Causes blood pressure to "bottom out"    Current Outpatient Medications  Medication Sig Dispense Refill  . acetaminophen (TYLENOL) 500 MG tablet Take 500 mg by mouth every 6 (six) hours as needed for moderate pain.    Marland Kitchen allopurinol (ZYLOPRIM) 300 MG tablet Take 300 mg by mouth daily.    Marland Kitchen atorvastatin (LIPITOR) 20 MG tablet TAKE 1 TABLET BY MOUTH DAILY 90 tablet 2  . b complex vitamins tablet Take 1 tablet by mouth daily.    . fenofibrate micronized (LOFIBRA) 134 MG capsule Take 134 mg by mouth daily before breakfast.    . furosemide (LASIX) 40 MG tablet Take 0.5 tablets (20 mg total) by mouth daily.    . IRON, FERROUS SULFATE, PO Take by mouth daily.    Marland Kitchen losartan (COZAAR) 50 MG tablet Take 1 tablet by mouth daily.    . metoprolol succinate (TOPROL-XL) 25 MG 24 hr tablet TAKE 1 TABLET BY MOUTH DAILY 90 tablet 3  . omeprazole (PRILOSEC) 20 MG capsule Take 20 mg by mouth daily.    . Rivaroxaban (XARELTO) 15 MG TABS tablet Take 1 tablet (15 mg total) by mouth daily with supper. 30 tablet 1  . Tamsulosin HCl (FLOMAX) 0.4 MG CAPS Take 1 capsule (0.4 mg total) by mouth daily. 30 capsule 0  . amLODipine (NORVASC) 5 MG tablet Take 1.5 tablets (7.5 mg total) by mouth daily. 135 tablet 3   No current facility-administered medications for this visit.    Social History   Socioeconomic History  . Marital status: Married    Spouse name: Not on file  . Number of children: Not on file  . Years  of education: Not on file  . Highest education level: Not on file  Occupational History  . Not on file  Tobacco Use  . Smoking status: Former Smoker    Packs/day: 1.00    Years: 30.00    Pack years: 30.00    Types: Cigarettes  . Smokeless tobacco: Never Used  . Tobacco comment: quit ~ 1985  Substance and Sexual Activity  . Alcohol use: No    Alcohol/week: 0.0 standard drinks  . Drug use: No  . Sexual activity: Not on file  Other Topics Concern  . Not on file  Social History Narrative  . Not on file   Social Determinants of Health   Financial Resource Strain:   . Difficulty of Paying Living Expenses: Not on file  Food Insecurity:   . Worried About Charity fundraiser in the Last Year: Not on file  . Ran Out of Food in the Last Year: Not on file  Transportation Needs:   . Lack of Transportation (Medical): Not on file  . Lack of Transportation (Non-Medical): Not on file  Physical Activity:   . Days of Exercise per Week: Not on file  . Minutes of Exercise per Session: Not on file  Stress:   . Feeling of Stress : Not on file  Social Connections:   . Frequency of Communication with Friends and Family: Not on file  . Frequency of Social Gatherings with Friends and Family: Not on file  . Attends Religious Services: Not on file  . Active Member of Clubs or Organizations: Not on file  . Attends Archivist Meetings: Not on file  . Marital Status: Not on  file  Intimate Partner Violence:   . Fear of Current or Ex-Partner: Not on file  . Emotionally Abused: Not on file  . Physically Abused: Not on file  . Sexually Abused: Not on file    Socially he is married, has 3 children 7 grandchildren. He does not use tobacco or alcohol.  ROS General: Negative; No fevers, chills, or night sweats; Moderate obesity HEENT: Negative; No changes in vision or hearing, sinus congestion, difficulty swallowing Pulmonary: Negative; No cough, wheezing, shortness of breath,  hemoptysis Cardiovascular: Negative; No chest pain, presyncope, syncope, palpatations GI: Recent GI bleed, status post endoscopy and colonoscopy without definitive abnormality. GU: Stage IV chronic kidney disease Musculoskeletal: History of paraspinal myositis  Hematologic/Oncology: Negative; no easy bruising, bleeding Endocrine: Negative; no heat/cold intolerance; no diabetes Neuro: Occasional paresthesias; no changes in balance, headaches Skin: Negative; No rashes or skin lesions Psychiatric: Negative; No behavioral problems, depression Sleep: Positive for sleep apnea on CPAP; No snoring, daytime sleepiness, hypersomnolence, bruxism, restless legs, hypnogognic hallucinations, no cataplexy Other comprehensive 14 point system review is negative.   PE BP (!) 166/85   Pulse 68   Ht 5' 6"  (1.676 m)   Wt 200 lb (90.7 kg)   SpO2 99%   BMI 32.28 kg/m    Repeat blood pressure by me was improved but still elevated at 148/86  Wt Readings from Last 3 Encounters:  01/13/19 200 lb (90.7 kg)  12/12/18 205 lb 9.6 oz (93.3 kg)  10/01/18 203 lb 8 oz (92.3 kg)   General: Alert, oriented, no distress.  Skin: normal turgor, no rashes, warm and dry HEENT: Normocephalic, atraumatic. Pupils equal round and reactive to light; sclera anicteric; extraocular muscles intact;  Nose without nasal septal hypertrophy Mouth/Parynx benign; Mallinpatti scale 3 Neck: No JVD, no carotid bruits; normal carotid upstroke Lungs: clear to ausculatation and percussion; no wheezing or rales Chest wall: without tenderness to palpitation Heart: PMI not displaced, RRR, s1 s2 normal, 1/6 systolic murmur, no diastolic murmur, no rubs, gallops, thrills, or heaves Abdomen: soft, nontender; no hepatosplenomehaly, BS+; abdominal aorta nontender and not dilated by palpation. Back: no CVA tenderness Pulses 2+ Musculoskeletal: full range of motion, normal strength, no joint deformities Extremities: Trivial ankle edema no  clubbing cyanosis, Homan's sign negative  Neurologic: grossly nonfocal; Cranial nerves grossly wnl Psychologic: Normal mood and affect   ECG (independently read by me): Ventricular paced rhythm at 68 bpm.  June 2019 ECG (independently read by me): Sinus rhythm appropriate atrial sensing and atrial pacing,  right bundle branch block, left axis deviation  September 2018 ECG (independently read by me): normal sinus rhythm at 60 bpm.  Incomplete right bundle branch block.  Mild inferolateral ST changes.  May 2018 ECG (independently read by me): sinus bradycardia 58 bpm.  Left axis deviation.  Right bundle branch block with repolarization changes.  QTc interval 471 ms.  April 2018 ECG (independently read by me):NSR At 61, left axis deviation.  Right bundle branch block.  September 2017 ECG (independently read by me): Normal sinus rhythm with mild sinus arrhythmia.  First degree AV block with a PR interval at 212 ms.  Right bundle-branch block.  March 2017 ECG (independently read by me): Normal sinus rhythm at 61 bpm.  Right bundle branch block with repolarization changes.  QTc interval 477 ms.  PR interval 204 ms.  October 2016 ECG (independently read by me): Normal sinus rhythm at 65 bpm.  Right bundle branch block.  First degree AV block.  June 2015  ECG (independently read by me): Normal sinus rhythm at 61 beats per minute.  Right bundle branch block.  Mild voltage criteria for LVH in aVL  ECG (independently read by me): Sinus rhythm at 60 beats per minute. Right bundle branch block; PAC, inferolateral T abnormalities.  LABS: I personally reviewed the lab work from November 15, 2018.  BMP Latest Ref Rng & Units 10/01/2018 12/25/2016 12/24/2016  Glucose 70 - 99 mg/dL 114(H) 94 89  BUN 8 - 23 mg/dL 38(H) 34(H) 30(H)  Creatinine 0.61 - 1.24 mg/dL 2.47(H) 2.26(H) 2.12(H)  BUN/Creat Ratio 10 - 24 - - -  Sodium 135 - 145 mmol/L 144 142 139  Potassium 3.5 - 5.1 mmol/L 4.5 4.3 3.7  Chloride 98  - 111 mmol/L 108 106 103  CO2 22 - 32 mmol/L 24 27 26   Calcium 8.9 - 10.3 mg/dL 9.6 9.5 9.1   Hepatic Function Latest Ref Rng & Units 10/01/2018 09/07/2016 09/06/2016  Total Protein 6.5 - 8.1 g/dL 7.6 4.9(L) 5.1(L)  Albumin 3.5 - 5.0 g/dL 4.0 2.8(L) 3.0(L)  AST 15 - 41 U/L 20 19 21   ALT 0 - 44 U/L 12 16(L) 17  Alk Phosphatase 38 - 126 U/L 76 48 49  Total Bilirubin 0.3 - 1.2 mg/dL 0.4 0.5 0.4   CBC Latest Ref Rng & Units 10/01/2018 10/01/2018 12/21/2016  WBC 4.0 - 10.5 K/uL 7.0 - 5.2  Hemoglobin 13.0 - 17.0 g/dL 12.1(L) - 11.2(L)  Hematocrit 37.5 - 51.0 % 40.7 39.9 35.9(L)  Platelets 150 - 400 K/uL 190 - 141(L)   Lab Results  Component Value Date   MCV 91.1 10/01/2018   MCV 82.3 12/21/2016   MCV 81.1 12/20/2016   Lab Results  Component Value Date   TSH 2.149 10/01/2018   No results found for: HGBA1C   Lipid Panel     Component Value Date/Time   CHOL 104 12/22/2016 0535   TRIG 91 12/22/2016 0535   HDL 29 (L) 12/22/2016 0535   CHOLHDL 3.6 12/22/2016 0535   VLDL 18 12/22/2016 0535   LDLCALC 57 12/22/2016 0535     BNP No results found for: PROBNP  Lipid Panel     Component Value Date/Time   CHOL 104 12/22/2016 0535   Lipid Panel     Component Value Date/Time   CHOL 104 12/22/2016 0535   TRIG 91 12/22/2016 0535   HDL 29 (L) 12/22/2016 0535   CHOLHDL 3.6 12/22/2016 0535   VLDL 18 12/22/2016 0535   LDLCALC 57 12/22/2016 0535    RADIOLOGY: No results found.  IMPRESSION:  1. Essential hypertension   2. Coronary artery disease involving coronary bypass graft of native heart without angina pectoris   3. Hx of CABG   4. Symptomatic bradycardia: s/p pacemaker   5. OSA on CPAP   6. Paroxysmal atrial fibrillation (HCC)   7. Anticoagulated   8. Chronic kidney disease (CKD), stage IV (severe) ([place])   9. 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 RBBB     ASSESSMENT AND PLAN: Paul Price is an 79 year old Caucasian male who is 28 years status post CABG  revascularization surgery in 1992 and 8 years since a stent was placed in the LAD beyond this diagonal vessel in 2012. He has a documented atretic LIMA graft and a patent vein graft which supplies this diagonal graft.   His prior echo Doppler study in 2013 showed an ejection fraction of 50-55% and probable grade 2 diastolic dysfunction. He did have mild left atrial  dilatation and mild mitral regurgitation.  On his echo Doppler study in April 2018 Ejection fraction remained stable at 34-19%; grade 2 diastolic dysfunction, and there was evidence for  severe left atrial enlargement with mild right atrial enlargement.  Mild pulmonary hypertension.  His nuclear perfusion study remained in the low risk with only a small defect in the mid apical and anterior wall consistent with prior infarction without associated ischemia.  He has a history of PAF which he was started on anticoagulation therapy and also has developed renal insufficiency.  He subsequently developed symptomatic sinus bradycardia with sinus exit block and has right bundle branch block.  He underwent a [employer] permanent pacemaker insertion in December 2018 by Paul Price was continue to follow him from a pacemaker standpoint.  He has developed stage IV chronic kidney disease.  Blood pressure today remains elevated despite him resuming amlodipine 5 mg, losartan 50 mg and furosemide 20 mg.  He is followed closely by Paul Price from the renal standpoint and continues to be on low-dose ARB therapy for delaying progression of his renal disease.  With his blood pressure elevation I have recommended resumption of his prior dose of amlodipine at 7.5 mg.  We discussed optimal blood pressure at less than 120/80 with stage I hypertension commencing at 130/80.  He is on reduced dose of Xarelto for anticoagulation in light of his CKD.  He has a follow-up appointment to see nephrology in several weeks.  He is not having any anginal symptomatology on his current medical  regimen and is doing remarkably well from a CAD standpoint with continued therapy.  He continues to be on atorvastatin 20 mg, and fenofibrate for his mixed hyperlipidemia.  He tells me recent laboratory was excellent.  In February 2020 LDL was 61.  In November 2020 total cholesterol was 114.  He continues to be on CPAP therapy with 100% compliance.  He is sleeping well and denies any residual daytime sleepiness or breakthrough snoring.  I will see him in 6 months for cardiology reevaluation.  Time spent: 25 minutes  Troy Sine, MD, [facility]  01/14/2019 11:30 AM

## 2019-01-13 NOTE — Patient Instructions (Signed)
Medication Instructions:  Increase amlodipine to 7.5mg  daily *If you need a refill on your cardiac medications before your next appointment, please call your pharmacy*  Lab Work: None needed  Testing/Procedures: None needed  Follow-Up: At [facility], you and your health needs are our priority.  As part of our continuing mission to provide you with exceptional heart care, we have created designated Provider Care Teams.  These Care Teams include your primary Cardiologist (physician) and Advanced Practice Providers (APPs -  Physician Assistants and Nurse Practitioners) who all work together to provide you with the care you need, when you need it.  Your next appointment:   6 month(s)  The format for your next appointment:   In Person  Provider:   Shelva Majestic, MD

## 2019-01-14 ENCOUNTER — Encounter: Payer: Self-pay | Admitting: Cardiovascular Disease

## 2019-01-15 ENCOUNTER — Inpatient Hospital Stay: Payer: Medicare Other | Attending: Hematology

## 2019-01-15 ENCOUNTER — Telehealth: Payer: Self-pay | Admitting: *Deleted

## 2019-01-15 ENCOUNTER — Inpatient Hospital Stay: Payer: Medicare Other | Admitting: Hematology

## 2019-01-15 ENCOUNTER — Telehealth: Payer: Self-pay | Admitting: Hematology

## 2019-01-15 ENCOUNTER — Other Ambulatory Visit: Payer: Self-pay

## 2019-01-15 VITALS — BP 138/60 | HR 64 | Temp 97.5°F | Resp 17 | Ht 66.0 in | Wt 201.6 lb

## 2019-01-15 DIAGNOSIS — D509 Iron deficiency anemia, unspecified: Secondary | ICD-10-CM

## 2019-01-15 DIAGNOSIS — D649 Anemia, unspecified: Secondary | ICD-10-CM | POA: Insufficient documentation

## 2019-01-15 DIAGNOSIS — E538 Deficiency of other specified B group vitamins: Secondary | ICD-10-CM

## 2019-01-15 DIAGNOSIS — N189 Chronic kidney disease, unspecified: Secondary | ICD-10-CM | POA: Insufficient documentation

## 2019-01-15 LAB — CMP (CANCER CENTER ONLY)
ALT: 17 U/L (ref 0–44)
AST: 26 U/L (ref 15–41)
Albumin: 3.8 g/dL (ref 3.5–5.0)
Alkaline Phosphatase: 70 U/L (ref 38–126)
Anion gap: 7 (ref 5–15)
BUN: 39 mg/dL — ABNORMAL HIGH (ref 8–23)
CO2: 26 mmol/L (ref 22–32)
Calcium: 9.3 mg/dL (ref 8.9–10.3)
Chloride: 110 mmol/L (ref 98–111)
Creatinine: 2.06 mg/dL — ABNORMAL HIGH (ref 0.61–1.24)
GFR, Est AFR Am: 34 mL/min — ABNORMAL LOW (ref >60)
GFR, Estimated: 30 mL/min — ABNORMAL LOW (ref >60)
Glucose, Bld: 110 mg/dL — ABNORMAL HIGH (ref 70–99)
Potassium: 4.7 mmol/L (ref 3.5–5.1)
Sodium: 143 mmol/L (ref 135–145)
Total Bilirubin: 0.4 mg/dL (ref 0.3–1.2)
Total Protein: 7.1 g/dL (ref 6.5–8.1)

## 2019-01-15 LAB — CBC WITH DIFFERENTIAL/PLATELET
Abs Immature Granulocytes: 0.03 10*3/uL (ref 0.00–0.07)
Basophils Absolute: 0 10*3/uL (ref 0.0–0.1)
Basophils Relative: 1 %
Eosinophils Absolute: 0.3 10*3/uL (ref 0.0–0.5)
Eosinophils Relative: 4 %
HCT: 39 % (ref 39.0–52.0)
Hemoglobin: 12.2 g/dL — ABNORMAL LOW (ref 13.0–17.0)
Immature Granulocytes: 1 %
Lymphocytes Relative: 15 %
Lymphs Abs: 1 10*3/uL (ref 0.7–4.0)
MCH: 28.4 pg (ref 26.0–34.0)
MCHC: 31.3 g/dL (ref 30.0–36.0)
MCV: 90.7 fL (ref 80.0–100.0)
Monocytes Absolute: 0.5 10*3/uL (ref 0.1–1.0)
Monocytes Relative: 7 %
Neutro Abs: 4.8 10*3/uL (ref 1.7–7.7)
Neutrophils Relative %: 72 %
Platelets: 165 10*3/uL (ref 150–400)
RBC: 4.3 MIL/uL (ref 4.22–5.81)
RDW: 15.9 % — ABNORMAL HIGH (ref 11.5–15.5)
WBC: 6.5 10*3/uL (ref 4.0–10.5)
nRBC: 0 % (ref 0.0–0.2)

## 2019-01-15 LAB — IRON AND TIBC
Iron: 54 ug/dL (ref 42–163)
Saturation Ratios: 15 % — ABNORMAL LOW (ref 20–55)
TIBC: 356 ug/dL (ref 202–409)
UIBC: 303 ug/dL (ref 117–376)

## 2019-01-15 LAB — FERRITIN: Ferritin: 98 ng/mL (ref 24–336)

## 2019-01-15 LAB — VITAMIN B12: Vitamin B-12: 563 pg/mL (ref 180–914)

## 2019-01-15 NOTE — Telephone Encounter (Signed)
Patient notified Download reviewed by Dr Claiborne Billings and reported as normal. No changes in CPAP required. Continue therapy.

## 2019-01-15 NOTE — Progress Notes (Signed)
HEMATOLOGY/ONCOLOGY CLINIC NOTE  Date of Service: 01/15/2019  Patient Care Team: [name], MD as PCP - General (Internal Medicine) Troy Sine, MD as PCP - Cardiology (Cardiology)  CHIEF COMPLAINTS/PURPOSE OF CONSULTATION:  Anemia  HISTORY OF PRESENTING ILLNESS:   Paul Price is a wonderful 79 y.o. male who has been referred to [place] by Dr Shelia Media for evaluation and management of anemia. The pt reports that he is doing well overall.    The pt reports that he does not feel differently than he did 6 months ago except for having improved energy. He has been taking iron pills for 3 weeks and has been feeling a significant boost of energy since. He has routinely done yardwork for hours, which is abnormal for him. Pt takes one iron pill per day with orange juice and has not noticed any stomach upset. He has not had a repeat iron test since Febuary. Pt's stools have been darker since beginning iron but they have not been black. Pt has not noticed any bloody stools but had a stool test about a month ago, which did find blood in his stool.   Pt does not currently have a GI but Dr. Arta Silence performed his colonoscopy and Dr. Ronnette Juniper performed his endoscopy, both in 2018. He has been taking Zarelto 20mg  daily for his Atrial Fibrillation for 3 years. He has been taking Allopurinol for his Gout, which has been severe in the past but is well controlled now. He has been on that for years. He is currently taking Lasix and is no longer taking Amlodipine. Pt reports that he maintains a balanced diet, is becoming more active and has lost 6-7 lbs in the last few weeks. Dr. Emelia Loron is his Nephrologist. Pt experiences frequent urination and believes that he has an enlarged prostate. He is not currently seeing a Urologist.   Pt has had Upper Endoscopy completed on 09/08/2016 with results revealing "- Normal esophagus. - Z-line regular, 40 cm from the incisors. - Erythematous mucosa in the  antrum. Biopsy is contraindicated. - Normal examined duodenum. - No specimens collected. - Scope induced trauma in duodenal bulb causing minimal self limited oozing."  Pt has had Colonoscopy completed on 09/09/2016 with results revealing "- Non-thrombosed external hemorrhoids found on perianal exam. - Internal hemorrhoids. - Diverticulosis in the sigmoid colon. - No source of melena seen; wonder if it is from the gastritis seen on prior endoscopy in setting of anticoagulation."  Most recent lab results (08/28/2018) of CBC is as follows: WBC at 6.8K, RBC at 3.75, Hgb at 9.5, HCT at 31.9, PLTs at 222K, MCV at 85.1, MCH at 25.3, MCHC at 29.8, RDW at 16.8, MPV at 12.5, Neutro Rel at 70.8, Lymphs Rel at 18.1, Monocytes Rel at 7.3, Eosinophils Rel at 3.2, Basophils Rel at 0.6.   On review of systems, pt reports frequent urination, more energy and denies constipation, bowel movement changes, bloody/ black stools and any other symptoms.   On PMHx the pt reports CKD, HTN, Afib, Gout, BPH  INTERVAL HISTORY:  Paul Price is a wonderful 79 y.o. male who is here for evaluation and management of anemia. The patient's last visit with [place] was on 10/16/2018. The pt reports that he is doing well overall.  The pt reports that he has been feeling well and has no new concerns in the interim. Pt has continued taking a Vitamin B complex and PO Iron once per day. He had to cut down  on his PO Iron due to stomach issues. Pt denies any bleeding concerns but has black stools, which he attributes to his PO Iron. Pt is currently taking Xarelto, which is causing some bruising. He continues to stay active by working in his yard. He and his wife have done their best to stay safe and socially distance.   Pt has an upcoming appointment with his Nephrologist on 01/28/19 and usually visits every 6 months, he also visits his PCP annually.   Lab results today (01/15/19) of CBC w/diff and CMP is as follows: all values are WNL except for  Hgb at 12.2, RDW at 15.9, Glucose at 110, BUN at 39, Creatinine at 2.06, GFR Est NON Af Am at 30. 01/15/2019 Ferritin at 98 01/15/2019 Vitamin B12 at 563 01/15/2019 Iron and TIBC is as follows: Iron at 54, TIBC at 356, Sat Ratios at 15, UIBC at 303  On review of systems, pt reports bruising and denies bloody stools, gum bleeds, other bleeding concerns, fatigue, unexpected weight loss, abdominal pain, bowel movement changes and any other symptoms.    MEDICAL HISTORY:  Past Medical History:  Diagnosis Date  . Anemia   . Arthritis    "shoulders" (09/07/2016)  . Atrial fibrillation ([place])   . BPH (benign prostatic hyperplasia)   . Chronic kidney disease (CKD), stage III (moderate)   . Coronary artery disease   . DDD (degenerative disc disease), cervical   . DVT (deep venous thrombosis) ([place]) 12/2013   LLE  . GERD (gastroesophageal reflux disease)   . Gout   . High cholesterol   . History of blood transfusion 09/06/2016  . History of scarlet fever 1940s  . History of stress test 06/2011   No significant ischemia, this is a low risk scan. Clinical correlation recommended Abnormal myocardial perfusion study.  Marland Kitchen Hx of echocardiogram 05/2009   EF 40-45%, he did have mild annular calcification with mild-to-moderate MR and mild TR as well as aortic valve sclerosis. Estimated RV systolic pressure was 21 mm.  . Hypertension   . Kidney failure   . 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 Neck pain    "not chronic" (09/07/2016)  . Obesity   . OSA on CPAP   . RBBB   . Renal insufficiency   . Spinal stenosis of lumbar region     SURGICAL HISTORY: Past Surgical History:  Procedure Laterality Date  . APPENDECTOMY    . CARDIAC CATHETERIZATION  2009   he was found to have a atretic LIMA graft to his LAD and had a patent vein graft supplying his diagonal vessel. At that time, he had 70% narrowing in his LAD beyond a diagonal vessel which was initially treated medically.  . COLONOSCOPY  WITH PROPOFOL Left 09/09/2016   Procedure: COLONOSCOPY WITH PROPOFOL;  Surgeon: Arta Silence, MD;  Location: [facility];  Service: Endoscopy;  Laterality: Left;  . CORONARY ANGIOPLASTY WITH STENT PLACEMENT  April 2012   LAD DES  . CORONARY ARTERY BYPASS GRAFT  1992   with LIMA to the LAD and diagonal.  . ESOPHAGOGASTRODUODENOSCOPY (EGD) WITH PROPOFOL Left 09/08/2016   Procedure: ESOPHAGOGASTRODUODENOSCOPY (EGD) WITH PROPOFOL;  Surgeon: Ronnette Juniper, MD;  Location: [facility];  Service: Gastroenterology;  Laterality: Left;  . INGUINAL HERNIA REPAIR     "don't remeimber which side"  . PACEMAKER IMPLANT N/A 12/25/2016   Procedure: PACEMAKER IMPLANT;  Surgeon: Deboraha Sprang, MD;  Location: [facility];  Service: Cardiovascular;  Laterality: N/A;  .  TONSILLECTOMY      SOCIAL HISTORY: Social History   Socioeconomic History  . Marital status: Married    Spouse name: Not on file  . Number of children: Not on file  . Years of education: Not on file  . Highest education level: Not on file  Occupational History  . Not on file  Tobacco Use  . Smoking status: Former Smoker    Packs/day: 1.00    Years: 30.00    Pack years: 30.00    Types: Cigarettes  . Smokeless tobacco: Never Used  . Tobacco comment: quit ~ 1985  Substance and Sexual Activity  . Alcohol use: No    Alcohol/week: 0.0 standard drinks  . Drug use: No  . Sexual activity: Not on file  Other Topics Concern  . Not on file  Social History Narrative  . Not on file   Social Determinants of Health   Financial Resource Strain:   . Difficulty of Paying Living Expenses: Not on file  Food Insecurity:   . Worried About Charity fundraiser in the Last Year: Not on file  . Ran Out of Food in the Last Year: Not on file  Transportation Needs:   . Lack of Transportation (Medical): Not on file  . Lack of Transportation (Non-Medical): Not on file  Physical Activity:   . Days of Exercise per Week: Not on file  . Minutes  of Exercise per Session: Not on file  Stress:   . Feeling of Stress : Not on file  Social Connections:   . Frequency of Communication with Friends and Family: Not on file  . Frequency of Social Gatherings with Friends and Family: Not on file  . Attends Religious Services: Not on file  . Active Member of Clubs or Organizations: Not on file  . Attends Archivist Meetings: Not on file  . Marital Status: Not on file  Intimate Partner Violence:   . Fear of Current or Ex-Partner: Not on file  . Emotionally Abused: Not on file  . Physically Abused: Not on file  . Sexually Abused: Not on file    FAMILY HISTORY: Family History  Problem Relation Age of Onset  . Heart disease Father     ALLERGIES:  is allergic to nitrostat [nitroglycerin].  MEDICATIONS:  Current Outpatient Medications  Medication Sig Dispense Refill  . acetaminophen (TYLENOL) 500 MG tablet Take 500 mg by mouth every 6 (six) hours as needed for moderate pain.    Marland Kitchen allopurinol (ZYLOPRIM) 300 MG tablet Take 300 mg by mouth daily.    Marland Kitchen amLODipine (NORVASC) 5 MG tablet Take 1.5 tablets (7.5 mg total) by mouth daily. 135 tablet 3  . atorvastatin (LIPITOR) 20 MG tablet TAKE 1 TABLET BY MOUTH DAILY 90 tablet 2  . b complex vitamins tablet Take 1 tablet by mouth daily.    . fenofibrate micronized (LOFIBRA) 134 MG capsule Take 134 mg by mouth daily before breakfast.    . furosemide (LASIX) 40 MG tablet Take 0.5 tablets (20 mg total) by mouth daily.    . IRON, FERROUS SULFATE, PO Take by mouth daily.    Marland Kitchen losartan (COZAAR) 50 MG tablet Take 1 tablet by mouth daily.    . metoprolol succinate (TOPROL-XL) 25 MG 24 hr tablet TAKE 1 TABLET BY MOUTH DAILY 90 tablet 3  . omeprazole (PRILOSEC) 20 MG capsule Take 20 mg by mouth daily.    . Rivaroxaban (XARELTO) 15 MG TABS tablet Take 1 tablet (15 mg  total) by mouth daily with supper. 30 tablet 1  . Tamsulosin HCl (FLOMAX) 0.4 MG CAPS Take 1 capsule (0.4 mg total) by mouth daily.  30 capsule 0   No current facility-administered medications for this visit.    REVIEW OF SYSTEMS:   A 10+ POINT REVIEW OF SYSTEMS WAS OBTAINED including neurology, dermatology, psychiatry, cardiac, respiratory, lymph, extremities, GI, GU, Musculoskeletal, constitutional, breasts, reproductive, HEENT.  All pertinent positives are noted in the HPI.  All others are negative.   PHYSICAL EXAMINATION: ECOG PERFORMANCE STATUS: 1 - Symptomatic but completely ambulatory  . Vitals:   01/15/19 1142  BP: 138/60  Pulse: 64  Resp: 17  Temp: (!) 97.5 F (36.4 C)  SpO2: 99%   Filed Weights   01/15/19 1142  Weight: 201 lb 9.6 oz (91.4 kg)   .Body mass index is 32.54 kg/m.   GENERAL:alert, in no acute distress and comfortable SKIN: no acute rashes, no significant lesions EYES: conjunctiva are pink and non-injected, sclera anicteric OROPHARYNX: MMM, no exudates, no oropharyngeal erythema or ulceration NECK: supple, no JVD LYMPH:  no palpable lymphadenopathy in the cervical, axillary or inguinal regions LUNGS: clear to auscultation b/l with normal respiratory effort HEART: regular rate & rhythm ABDOMEN:  normoactive bowel sounds , non tender, not distended. No palpable hepatosplenomegaly.  Extremity: no pedal edema PSYCH: alert & oriented x 3 with fluent speech NEURO: no focal motor/sensory deficits  LABORATORY DATA:  I have reviewed the data as listed  . CBC Latest Ref Rng & Units 01/15/2019 10/01/2018 10/01/2018  WBC 4.0 - 10.5 K/uL 6.5 7.0 -  Hemoglobin 13.0 - 17.0 g/dL 12.2(L) 12.1(L) -  Hematocrit 39.0 - 52.0 % 39.0 40.7 39.9  Platelets 150 - 400 K/uL 165 190 -   . CBC    Component Value Date/Time   WBC 6.5 01/15/2019 1116   RBC 4.30 01/15/2019 1116   HGB 12.2 (L) 01/15/2019 1116   HCT 39.0 01/15/2019 1116   HCT 39.9 10/01/2018 1130   PLT 165 01/15/2019 1116   MCV 90.7 01/15/2019 1116   MCH 28.4 01/15/2019 1116   MCHC 31.3 01/15/2019 1116   RDW 15.9 (H) 01/15/2019 1116    LYMPHSABS 1.0 01/15/2019 1116   MONOABS 0.5 01/15/2019 1116   EOSABS 0.3 01/15/2019 1116   BASOSABS 0.0 01/15/2019 1116    . CMP Latest Ref Rng & Units 01/15/2019 10/01/2018 12/25/2016  Glucose 70 - 99 mg/dL 110(H) 114(H) 94  BUN 8 - 23 mg/dL 39(H) 38(H) 34(H)  Creatinine 0.61 - 1.24 mg/dL 2.06(H) 2.47(H) 2.26(H)  Sodium 135 - 145 mmol/L 143 144 142  Potassium 3.5 - 5.1 mmol/L 4.7 4.5 4.3  Chloride 98 - 111 mmol/L 110 108 106  CO2 22 - 32 mmol/L 26 24 27   Calcium 8.9 - 10.3 mg/dL 9.3 9.6 9.5  Total Protein 6.5 - 8.1 g/dL 7.1 7.6 -  Total Bilirubin 0.3 - 1.2 mg/dL 0.4 0.4 -  Alkaline Phos 38 - 126 U/L 70 76 -  AST 15 - 41 U/L 26 20 -  ALT 0 - 44 U/L 17 12 -   . Lab Results  Component Value Date   IRON 54 01/15/2019   TIBC 356 01/15/2019   IRONPCTSAT 15 (L) 01/15/2019   (Iron and TIBC)  Lab Results  Component Value Date   FERRITIN 98 01/15/2019     09/09/2016 Colonoscopy    09/08/2016 Upper Endoscopy    RADIOGRAPHIC STUDIES: I have personally reviewed the radiological images as listed and agreed  with the findings in the report. No results found.  ASSESSMENT & PLAN:   1) Normocytic Anemia - likely related to CKD and concern for GI bleeding  PLAN: -Discussed pt labwork today, 01/15/19; Hgb looks good, blood counts and chemistries are stable  -Discussed 01/15/2019 Ferritin at 98 -Discussed 01/15/2019 Vitamin B12 at 563 -Discussed 01/15/2019 Iron and TIBC is as follows: Iron at 54, TIBC at 356, Sat Ratios at 15, UIBC at 303 -Due to CKD, Goal Ferritin >100; Would target ferritin of 250 if anemia with ferritin at 100. -Advised pt that we would consider IV iron if Ferritin is continuing to decrease  -If hgb <9 despite control of GI bleeding and adequate iron replacement -- might need to consider ESA's for CKD -Continue PO Iron daily -Continue B-complex vitamin daily  -If labs stable in 6 months will consider discharge to PCP -Will see back in 6 months with  labs   FOLLOW UP: RTC with Dr Irene Limbo with labs in 6 months  The total time spent in the appt was 20 minutes and more than 50% was on counseling and direct patient cares.  All of the patient's questions were answered with apparent satisfaction. The patient knows to call the clinic with any problems, questions or concerns.    Sullivan Lone MD [place] AAHIVMS [facility] Hematology/Oncology Physician [facility]  (Office):       [phone] (Work cell):  [phone] (Fax):           [phone]  01/15/2019 2:43 PM  I, Yevette Edwards, am acting as a scribe for Dr. Sullivan Lone.   .I have reviewed the above documentation for accuracy and completeness, and I agree with the above. Brunetta Genera MD

## 2019-01-15 NOTE — Telephone Encounter (Signed)
Scheduled appt per 1/6 los.  Sent a message to HIM to get a calendar mailed out.

## 2019-01-16 LAB — CUP PACEART INCLINIC DEVICE CHECK
Date Time Interrogation Session: 20201203144559
Implantable Lead Implant Date: 20181217
Implantable Lead Implant Date: 20181217
Implantable Lead Location: 753859
Implantable Lead Location: 753859
Implantable Lead Model: 3830
Implantable Lead Model: 5076
Implantable Pulse Generator Implant Date: 20181217

## 2019-01-24 ENCOUNTER — Ambulatory Visit (INDEPENDENT_AMBULATORY_CARE_PROVIDER_SITE_OTHER): Payer: Medicare Other | Admitting: *Deleted

## 2019-01-24 DIAGNOSIS — I441 Atrioventricular block, second degree: Secondary | ICD-10-CM | POA: Diagnosis not present

## 2019-01-24 LAB — CUP PACEART REMOTE DEVICE CHECK
Battery Remaining Longevity: 98 mo
Battery Voltage: 2.93 V
Brady Statistic AP VP Percent: 38.81 %
Brady Statistic AP VS Percent: 0.01 %
Brady Statistic AS VP Percent: 60.72 %
Brady Statistic AS VS Percent: 0.47 %
Brady Statistic RA Percent Paced: 39.15 %
Brady Statistic RV Percent Paced: 99.53 %
Date Time Interrogation Session: 20210115094256
Implantable Lead Implant Date: 20181217
Implantable Lead Implant Date: 20181217
Implantable Lead Location: 753859
Implantable Lead Location: 753859
Implantable Lead Model: 3830
Implantable Lead Model: 5076
Implantable Pulse Generator Implant Date: 20181217
Lead Channel Impedance Value: 323 Ohm
Lead Channel Impedance Value: 342 Ohm
Lead Channel Impedance Value: 418 Ohm
Lead Channel Impedance Value: 418 Ohm
Lead Channel Pacing Threshold Amplitude: 0.5 V
Lead Channel Pacing Threshold Amplitude: 0.75 V
Lead Channel Pacing Threshold Pulse Width: 0.4 ms
Lead Channel Pacing Threshold Pulse Width: 0.4 ms
Lead Channel Sensing Intrinsic Amplitude: 1 mV
Lead Channel Sensing Intrinsic Amplitude: 1 mV
Lead Channel Sensing Intrinsic Amplitude: 14.125 mV
Lead Channel Sensing Intrinsic Amplitude: 14.125 mV
Lead Channel Setting Pacing Amplitude: 2 V
Lead Channel Setting Pacing Amplitude: 2.5 V
Lead Channel Setting Pacing Pulse Width: 1 ms
Lead Channel Setting Sensing Sensitivity: 1.2 mV

## 2019-01-24 NOTE — Progress Notes (Signed)
PPM remote 

## 2019-01-28 DIAGNOSIS — N2581 Secondary hyperparathyroidism of renal origin: Secondary | ICD-10-CM | POA: Diagnosis not present

## 2019-01-28 DIAGNOSIS — I10 Essential (primary) hypertension: Secondary | ICD-10-CM | POA: Diagnosis not present

## 2019-01-28 DIAGNOSIS — N184 Chronic kidney disease, stage 4 (severe): Secondary | ICD-10-CM | POA: Diagnosis not present

## 2019-01-28 DIAGNOSIS — N179 Acute kidney failure, unspecified: Secondary | ICD-10-CM | POA: Diagnosis not present

## 2019-01-28 DIAGNOSIS — D631 Anemia in chronic kidney disease: Secondary | ICD-10-CM | POA: Diagnosis not present

## 2019-02-06 DIAGNOSIS — D4101 Neoplasm of uncertain behavior of right kidney: Secondary | ICD-10-CM | POA: Diagnosis not present

## 2019-02-06 DIAGNOSIS — N35911 Unspecified urethral stricture, male, meatal: Secondary | ICD-10-CM | POA: Diagnosis not present

## 2019-02-10 DIAGNOSIS — G4733 Obstructive sleep apnea (adult) (pediatric): Secondary | ICD-10-CM | POA: Diagnosis not present

## 2019-02-26 DIAGNOSIS — D49511 Neoplasm of unspecified behavior of right kidney: Secondary | ICD-10-CM | POA: Diagnosis not present

## 2019-02-26 DIAGNOSIS — K8020 Calculus of gallbladder without cholecystitis without obstruction: Secondary | ICD-10-CM | POA: Diagnosis not present

## 2019-02-26 DIAGNOSIS — N2889 Other specified disorders of kidney and ureter: Secondary | ICD-10-CM | POA: Diagnosis not present

## 2019-03-10 DIAGNOSIS — G4733 Obstructive sleep apnea (adult) (pediatric): Secondary | ICD-10-CM | POA: Diagnosis not present

## 2019-03-11 ENCOUNTER — Other Ambulatory Visit: Payer: Self-pay | Admitting: Urology

## 2019-03-11 ENCOUNTER — Telehealth: Payer: Self-pay | Admitting: Cardiovascular Disease

## 2019-03-11 NOTE — Telephone Encounter (Signed)
Clinical pharmacist to review Xarelto. On Xarelto for afib.

## 2019-03-11 NOTE — Telephone Encounter (Signed)
Patient with diagnosis of afib on Xarelto for anticoagulation.    Procedure:  Right nephrectomy  Date of procedure: 03/31/19  CHADS2-VASc score of  5 (CHF, HTN, AGE, CAD, AGE)  Of note, patient does have a history of single DVT in 2015  CrCl 30 ml/min   Per office protocol, patient can hold Xarelto for 3 days prior to procedure due to renal function.

## 2019-03-11 NOTE — Telephone Encounter (Signed)
   Primary Cardiologist: Shelva Majestic, MD  Chart reviewed as part of pre-operative protocol coverage. Patient was contacted 03/11/2019 in reference to pre-operative risk assessment for pending surgery as outlined below.  Paul Price was last seen on 01/13/2019 by Dr. Claiborne Billings.  Since that day, Paul Price has done well without any chest pain or shortness of breath.  Therefore, based on ACC/AHA guidelines, the patient would be at acceptable risk for the planned procedure without further cardiovascular testing.   I will route this recommendation to the requesting party via Epic fax function and remove from pre-op pool.  Please call with questions. He has been instructed to hold Xarelto for 3 days prior to the surgery given his renal function, he can restart Xarelto as soon as possible after the surgery at the surgeon's discretion based on the bleeding risk.   [place], [place] 03/11/2019, 3:57 PM

## 2019-03-11 NOTE — Telephone Encounter (Signed)
   [facility] Pre-operative Risk Assessment    Request for surgical clearance:  1. What type of surgery is being performed? Right nephrectomy   2. When is this surgery scheduled? 03/31/19   3. What type of clearance is required (medical clearance vs. Pharmacy clearance to hold med vs. Both)? both  4. Are there any medications that need to be held prior to surgery and how long? Xarelto; up to Dr. Claiborne Billings  5. Practice name and name of physician performing surgery? [facility], Dr. Gloriann Loan  6. What is your office phone number [phone] ext [phone]    7.   What is your office fax number [phone]  8.   Anesthesia type (None, local, MAC, general) ? General    _________________________________________________________________   (provider comments below)

## 2019-03-12 ENCOUNTER — Other Ambulatory Visit (HOSPITAL_COMMUNITY): Payer: Self-pay | Admitting: Urology

## 2019-03-12 ENCOUNTER — Ambulatory Visit (HOSPITAL_COMMUNITY)
Admission: RE | Admit: 2019-03-12 | Discharge: 2019-03-12 | Disposition: A | Discharge status: Home / Self Care | Payer: Medicare Other | Source: Ambulatory Visit | Attending: Urology | Admitting: Urology

## 2019-03-12 ENCOUNTER — Other Ambulatory Visit: Payer: Self-pay

## 2019-03-12 DIAGNOSIS — D4101 Neoplasm of uncertain behavior of right kidney: Secondary | ICD-10-CM

## 2019-03-12 DIAGNOSIS — C641 Malignant neoplasm of right kidney, except renal pelvis: Secondary | ICD-10-CM | POA: Diagnosis not present

## 2019-03-12 DIAGNOSIS — N184 Chronic kidney disease, stage 4 (severe): Secondary | ICD-10-CM | POA: Diagnosis not present

## 2019-03-19 ENCOUNTER — Other Ambulatory Visit (HOSPITAL_COMMUNITY): Payer: Self-pay | Admitting: *Deleted

## 2019-03-19 NOTE — Patient Instructions (Addendum)
DUE TO COVID-19 ONLY ONE VISITOR IS ALLOWED TO COME WITH YOU AND STAY IN THE WAITING ROOM ONLY DURING PRE OP AND PROCEDURE DAY OF SURGERY. THE 1 VISITOR MAY VISIT WITH YOU AFTER SURGERY IN YOUR PRIVATE ROOM DURING VISITING HOURS ONLY!  YOU NEED TO HAVE A COVID 19 TEST ON_ Thursday 03/18/2021______ @___1 :00 PM____, THIS TEST MUST BE DONE BEFORE SURGERY, COME  [name], [name] , [address].  ([name]) ONCE YOUR COVID TEST IS COMPLETED, PLEASE BEGIN THE QUARANTINE INSTRUCTIONS AS OUTLINED IN YOUR HANDOUT.                Paul Price     Your procedure is scheduled on: Monday 03/31/2019   Report to [facility] Main  Entrance    Report to admitting at 1010  AM     Call this number if you have problems the morning of surgery [phone]     Remember: Do not eat food or drink liquids :After Midnight.     BRUSH YOUR TEETH MORNING OF SURGERY AND RINSE YOUR MOUTH OUT, NO CHEWING GUM CANDY OR MINTS.     Take these medicines the morning of surgery with A SIP OF WATER: Atorvastatin (Lipitor), Metoprolol succinate (Toprol-XL), Amlodipine (Norvasc), Allopurinol (Zyloprim), Fenofibrate, Flomax                                 You may not have any metal on your body including hair pins and              piercings  Do not wear jewelry, make-up, lotions, powders or perfumes, deodorant                          Men may shave face and neck.   Do not bring valuables to the hospital. [place].  Contacts, dentures or bridgework may not be worn into surgery.  Leave suitcase in the car. After surgery it may be brought to your room.                 Please read over the following fact sheets you were given: _____________________________________________________________________             [facility] - Preparing for Surgery Before surgery, you can play an important role.  Because skin is not sterile, your skin  needs to be as free of germs as possible.  You can reduce the number of germs on your skin by washing with CHG (chlorahexidine gluconate) soap before surgery.  CHG is an antiseptic cleaner which kills germs and bonds with the skin to continue killing germs even after washing. Please DO NOT use if you have an allergy to CHG or antibacterial soaps.  If your skin becomes reddened/irritated stop using the CHG and inform your nurse when you arrive at Short Stay. Do not shave (including legs and underarms) for at least 48 hours prior to the first CHG shower.  You may shave your face/neck. Please follow these instructions carefully:  1.  Shower with CHG Soap the night before surgery and the  morning of Surgery.  2.  If you choose to wash your hair, wash your hair first as usual with your  normal  shampoo.  3.  After you shampoo, rinse  your hair and body thoroughly to remove the  shampoo.                             4.  Use CHG as you would any other liquid soap.  You can apply chg directly  to the skin and wash                       Gently with a scrungie or clean washcloth.  5.  Apply the CHG Soap to your body ONLY FROM THE NECK DOWN.   Do not use on face/ open                           Wound or open sores. Avoid contact with eyes, ears mouth and genitals (private parts).                       Wash face,  Genitals (private parts) with your normal soap.             6.  Wash thoroughly, paying special attention to the area where your surgery  will be performed.  7.  Thoroughly rinse your body with warm water from the neck down.  8.  DO NOT shower/wash with your normal soap after using and rinsing off  the CHG Soap.                9.  Pat yourself dry with a clean towel.            10.  Wear clean pajamas.            11.  Place clean sheets on your bed the night of your first shower and do not  sleep with pets. Day of Surgery : Do not apply any lotions/deodorants the morning of surgery.  Please wear clean  clothes to the hospital/surgery center.  FAILURE TO FOLLOW THESE INSTRUCTIONS MAY RESULT IN THE CANCELLATION OF YOUR SURGERY PATIENT SIGNATURE_________________________________  NURSE SIGNATURE__________________________________  ________________________________________________________________________  WHAT IS A BLOOD TRANSFUSION? Blood Transfusion Information  A transfusion is the replacement of blood or some of its parts. Blood is made up of multiple cells which provide different functions.  Red blood cells carry oxygen and are used for blood loss replacement.  White blood cells fight against infection.  Platelets control bleeding.  Plasma helps clot blood.  Other blood products are available for specialized needs, such as hemophilia or other clotting disorders. BEFORE THE TRANSFUSION  Who gives blood for transfusions?   Healthy volunteers who are fully evaluated to make sure their blood is safe. This is blood bank blood. Transfusion therapy is the safest it has ever been in the practice of medicine. Before blood is taken from a donor, a complete history is taken to make sure that person has no history of diseases nor engages in risky social behavior (examples are intravenous drug use or sexual activity with multiple partners). The donor's travel history is screened to minimize risk of transmitting infections, such as malaria. The donated blood is tested for signs of infectious diseases, such as HIV and hepatitis. The blood is then tested to be sure it is compatible with you in order to minimize the chance of a transfusion reaction. If you or a relative donates blood, this is often done in anticipation of surgery and is not appropriate for emergency situations. It takes  many days to process the donated blood. RISKS AND COMPLICATIONS Although transfusion therapy is very safe and saves many lives, the main dangers of transfusion include:   Getting an infectious disease.  Developing a  transfusion reaction. This is an allergic reaction to something in the blood you were given. Every precaution is taken to prevent this. The decision to have a blood transfusion has been considered carefully by your caregiver before blood is given. Blood is not given unless the benefits outweigh the risks. AFTER THE TRANSFUSION  Right after receiving a blood transfusion, you will usually feel much better and more energetic. This is especially true if your red blood cells have gotten low (anemic). The transfusion raises the level of the red blood cells which carry oxygen, and this usually causes an energy increase.  The nurse administering the transfusion will monitor you carefully for complications. HOME CARE INSTRUCTIONS  No special instructions are needed after a transfusion. You may find your energy is better. Speak with your caregiver about any limitations on activity for underlying diseases you may have. SEEK MEDICAL CARE IF:   Your condition is not improving after your transfusion.  You develop redness or irritation at the intravenous (IV) site. SEEK IMMEDIATE MEDICAL CARE IF:  Any of the following symptoms occur over the next 12 hours:  Shaking chills.  You have a temperature by mouth above 102 F (38.9 C), not controlled by medicine.  Chest, back, or muscle pain.  People around you feel you are not acting correctly or are confused.  Shortness of breath or difficulty breathing.  Dizziness and fainting.  You get a rash or develop hives.  You have a decrease in urine output.  Your urine turns a dark color or changes to pink, red, or brown. Any of the following symptoms occur over the next 10 days:  You have a temperature by mouth above 102 F (38.9 C), not controlled by medicine.  Shortness of breath.  Weakness after normal activity.  The white part of the eye turns yellow (jaundice).  You have a decrease in the amount of urine or are urinating less often.  Your  urine turns a dark color or changes to pink, red, or brown. Document Released: 12/24/1999 Document Revised: 03/20/2011 Document Reviewed: 08/12/2007 [facility] Patient Information 2014 [place], [place].  _______________________________________________________________________

## 2019-03-21 ENCOUNTER — Encounter (HOSPITAL_COMMUNITY)
Admission: RE | Admit: 2019-03-21 | Discharge: 2019-03-21 | Disposition: A | Discharge status: Home / Self Care | Payer: Medicare Other | Source: Ambulatory Visit | Attending: Urology | Admitting: Urology

## 2019-03-21 ENCOUNTER — Other Ambulatory Visit: Payer: Self-pay

## 2019-03-21 ENCOUNTER — Encounter (HOSPITAL_COMMUNITY): Payer: Self-pay

## 2019-03-21 DIAGNOSIS — Z79899 Other long term (current) drug therapy: Secondary | ICD-10-CM | POA: Diagnosis not present

## 2019-03-21 DIAGNOSIS — Z950 Presence of cardiac pacemaker: Secondary | ICD-10-CM | POA: Insufficient documentation

## 2019-03-21 DIAGNOSIS — K219 Gastro-esophageal reflux disease without esophagitis: Secondary | ICD-10-CM | POA: Diagnosis not present

## 2019-03-21 DIAGNOSIS — D509 Iron deficiency anemia, unspecified: Secondary | ICD-10-CM | POA: Diagnosis not present

## 2019-03-21 DIAGNOSIS — N2889 Other specified disorders of kidney and ureter: Secondary | ICD-10-CM | POA: Insufficient documentation

## 2019-03-21 DIAGNOSIS — Z7901 Long term (current) use of anticoagulants: Secondary | ICD-10-CM | POA: Insufficient documentation

## 2019-03-21 DIAGNOSIS — M5030 Other cervical disc degeneration, unspecified cervical region: Secondary | ICD-10-CM | POA: Insufficient documentation

## 2019-03-21 DIAGNOSIS — G4733 Obstructive sleep apnea (adult) (pediatric): Secondary | ICD-10-CM | POA: Insufficient documentation

## 2019-03-21 DIAGNOSIS — I4891 Unspecified atrial fibrillation: Secondary | ICD-10-CM | POA: Insufficient documentation

## 2019-03-21 DIAGNOSIS — Z86718 Personal history of other venous thrombosis and embolism: Secondary | ICD-10-CM | POA: Diagnosis not present

## 2019-03-21 DIAGNOSIS — Z01812 Encounter for preprocedural laboratory examination: Secondary | ICD-10-CM | POA: Insufficient documentation

## 2019-03-21 DIAGNOSIS — M109 Gout, unspecified: Secondary | ICD-10-CM | POA: Diagnosis not present

## 2019-03-21 DIAGNOSIS — N183 Chronic kidney disease, stage 3 unspecified: Secondary | ICD-10-CM | POA: Insufficient documentation

## 2019-03-21 DIAGNOSIS — M48061 Spinal stenosis, lumbar region without neurogenic claudication: Secondary | ICD-10-CM | POA: Insufficient documentation

## 2019-03-21 DIAGNOSIS — I129 Hypertensive chronic kidney disease with stage 1 through stage 4 chronic kidney disease, or unspecified chronic kidney disease: Secondary | ICD-10-CM | POA: Diagnosis not present

## 2019-03-21 DIAGNOSIS — E7800 Pure hypercholesterolemia, unspecified: Secondary | ICD-10-CM | POA: Diagnosis not present

## 2019-03-21 DIAGNOSIS — Z87891 Personal history of nicotine dependence: Secondary | ICD-10-CM | POA: Insufficient documentation

## 2019-03-21 HISTORY — DX: Other specified disorders of kidney and ureter: N28.89

## 2019-03-21 HISTORY — DX: Iron deficiency anemia, unspecified: D50.9

## 2019-03-21 LAB — BASIC METABOLIC PANEL
Anion gap: 10 (ref 5–15)
BUN: 39 mg/dL — ABNORMAL HIGH (ref 8–23)
CO2: 25 mmol/L (ref 22–32)
Calcium: 9.2 mg/dL (ref 8.9–10.3)
Chloride: 107 mmol/L (ref 98–111)
Creatinine, Ser: 2.01 mg/dL — ABNORMAL HIGH (ref 0.61–1.24)
GFR calc Af Amer: 35 mL/min — ABNORMAL LOW (ref >60)
GFR calc non Af Amer: 30 mL/min — ABNORMAL LOW (ref >60)
Glucose, Bld: 115 mg/dL — ABNORMAL HIGH (ref 70–99)
Potassium: 4.5 mmol/L (ref 3.5–5.1)
Sodium: 142 mmol/L (ref 135–145)

## 2019-03-21 LAB — CBC
HCT: 39 % (ref 39.0–52.0)
Hemoglobin: 11.6 g/dL — ABNORMAL LOW (ref 13.0–17.0)
MCH: 28.6 pg (ref 26.0–34.0)
MCHC: 29.7 g/dL — ABNORMAL LOW (ref 30.0–36.0)
MCV: 96.1 fL (ref 80.0–100.0)
Platelets: 182 10*3/uL (ref 150–400)
RBC: 4.06 MIL/uL — ABNORMAL LOW (ref 4.22–5.81)
RDW: 16.2 % — ABNORMAL HIGH (ref 11.5–15.5)
WBC: 7.4 10*3/uL (ref 4.0–10.5)
nRBC: 0 % (ref 0.0–0.2)

## 2019-03-21 LAB — PROTIME-INR
INR: 1.8 — ABNORMAL HIGH (ref 0.8–1.2)
Prothrombin Time: 21.1 seconds — ABNORMAL HIGH (ref 11.4–15.2)

## 2019-03-21 LAB — ABO/RH: ABO/RH(D): O POS

## 2019-03-21 NOTE — Progress Notes (Addendum)
PCP - Dr. [name] Cardiologist - Dr. Shelva Majestic last office visit 01/13/19, Dr. Olin Pia last office visit 12/12/18, last device check 01/24/19 in epic    Chest x-ray - 03/12/19 in epic EKG - 01/13/19 in epic Stress Test - greater than 2 years ECHO - 12/12/18 in epic Cardiac Cath - greater than 2 years  Sleep Study - greater than 2 years CPAP - Yes  Fasting Blood Sugar - N/A Checks Blood Sugar __N/A___ times a day  Blood Thinner Instructions: Xarelto last dose 03/27/19 Aspirin Instructions: N/A Last Dose:N/A  Anesthesia review: Afib, CAD, CABG, HTN, CHF, OSA/CPAP, PACEMAKER  Patient denies shortness of breath, fever, cough and chest pain at PAT appointment   Patient verbalized understanding of instructions that were given to them at the PAT appointment. Patient was also instructed that they will need to review over the PAT instructions again at home before surgery.

## 2019-03-21 NOTE — Progress Notes (Signed)
Chart given toJessica Z P.A. to review BMP and PT results 03/21/19 and medical history.

## 2019-03-24 NOTE — Progress Notes (Signed)
Anesthesia Chart Review   Case: [id number] Date/Time: 03/31/19 1200   Procedure: RIGHT  HNAD ASSIST LAPAROSCOPIC NEPHRECTOMY (Right )   Anesthesia type: General   Pre-op diagnosis: RIGHT RENAL MASS   Location: [facility] ROOM 03 / [facility]   Surgeons: Lucas Mallow, MD      DISCUSSION:78 y.o. 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 Link Snuffer.   Pt cleared by cardiology 03/11/19.  Per Almyra Deforest, PA, "Chart reviewed as part of pre-operative protocol coverage. Patient was contacted 03/11/2019 in reference to pre-operative risk assessment for pending surgery as outlined below.  Mahamud E Larmon was last seen on 01/13/2019 by Dr. Claiborne Billings.  Since that day, HARRIS PENTON has done well without any chest pain or shortness of breath. Therefore, based on ACC/AHA guidelines, the patient would be at acceptable risk for the planned procedure without further cardiovascular testing.  I will route this recommendation to the requesting party via Epic fax function and remove from pre-op pool. Please call with questions. He has been instructed to hold Xarelto for 3 days prior to the surgery given his renal function, he can restart Xarelto as soon as possible after the surgery at the surgeon's discretion based on the bleeding risk."  Anticipate pt can proceed with planned procedure barring acute status change.   VS: BP (!) 154/68   Pulse 66   Temp 36.6 C (Oral)   Resp 16   Ht 5\' 5"  (1.651 m)   Wt 90.7 kg   SpO2 100%   BMI 33.28 kg/m   PROVIDERS: [name], MD is PCP   Shelva Majestic, MD is Cardiologist   Virl Axe, MD is Electrophysiologist  LABS: Labs reviewed: Acceptable for surgery. (all labs ordered are listed, but only abnormal results are displayed)  Labs Reviewed   BASIC METABOLIC PANEL - Abnormal; Notable for the following components:      Result Value   Glucose, Bld 115 (*)    BUN 39 (*)    Creatinine, Ser 2.01 (*)    GFR calc non Af Amer 30 (*)    GFR calc Af Amer 35 (*)    All other components within normal limits  CBC - Abnormal; Notable for the following components:   RBC 4.06 (*)    Hemoglobin 11.6 (*)    MCHC 29.7 (*)    RDW 16.2 (*)    All other components within normal limits  PROTIME-INR - Abnormal; Notable for the following components:   Prothrombin Time 21.1 (*)    INR 1.8 (*)    All other components within normal limits  TYPE AND SCREEN  ABO/RH     IMAGES:   EKG: 01/13/19 Rate 68 bpm Ventricular-paced rhythm   CV: Echo 12/21/2016 Study Conclusions   - Left ventricle: The cavity size was normal. Wall thickness was  increased in a pattern of moderate LVH. Systolic function was  mildly to moderately reduced. The estimated ejection fraction was  in the range of 40% to 45%. Diffuse hypokinesis. The study is not  technically sufficient to allow evaluation of LV diastolic  function.  - Ventricular septum: Septal motion showed paradox. The contour  showed diastolic flattening.  - Left atrium: The atrium was mildly dilated.  - Right ventricle: The cavity size was  mildly dilated. Wall  thickness was normal.  - Tricuspid valve: There was moderate regurgitation.  - Pulmonary arteries: Systolic pressure was moderately increased.  PA peak pressure: 60 mm Hg (S).   Myocardial Perfusion 05/04/16  The left ventricular ejection fraction is mildly decreased (45-54%).  Nuclear stress EF is calculated at 47% but appears to be at 55%.  There was no ST segment deviation noted during stress.  There is a small defect of mild severity present in the mid anterior and apical anterior location. 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 No ischemia noted.  This is a low risk study. Past Medical  History:  Diagnosis Date  . Arthritis    "shoulders" (09/07/2016)  . Atrial fibrillation ([place])   . BPH (benign prostatic hyperplasia)   . Chronic kidney disease (CKD), stage III (moderate)   . Coronary artery disease   . DDD (degenerative disc disease), cervical   . DVT (deep venous thrombosis) ([place]) 12/2013   LLE  . GERD (gastroesophageal reflux disease)   . Gout   . High cholesterol   . History of blood transfusion 09/06/2016   2 u PRBC  . History of scarlet fever 1940s  . History of stress test 06/2011   No significant ischemia, this is a low risk scan. Clinical correlation recommended Abnormal myocardial perfusion study.  Marland Kitchen Hx of echocardiogram 05/2009   EF 40-45%, he did have mild annular calcification with mild-to-moderate MR and mild TR as well as aortic valve sclerosis. Estimated RV systolic pressure was 21 mm.  . Hypertension   . Iron deficiency anemia   . Kidney failure   . 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 Neck pain    "not chronic" (09/07/2016)  . Obesity   . OSA on CPAP   . RBBB   . Renal insufficiency   . Right renal mass   . Spinal stenosis of lumbar region     Past Surgical History:  Procedure Laterality Date  . APPENDECTOMY    . CARDIAC CATHETERIZATION  2009   he was found to have a atretic LIMA graft to his LAD and had a patent vein graft supplying his diagonal vessel. At that time, he had 70% narrowing in his LAD beyond a diagonal vessel which was initially treated medically.  . COLONOSCOPY WITH PROPOFOL Left 09/09/2016   Procedure: COLONOSCOPY WITH PROPOFOL;  Surgeon: Arta Silence, MD;  Location: [facility];  Service: Endoscopy;  Laterality: Left;  . CORONARY ANGIOPLASTY WITH STENT PLACEMENT  April 2012   LAD DES  . CORONARY ARTERY BYPASS GRAFT  1992   with LIMA to the LAD and diagonal.  . ESOPHAGOGASTRODUODENOSCOPY (EGD) WITH PROPOFOL Left 09/08/2016   Procedure: ESOPHAGOGASTRODUODENOSCOPY (EGD) WITH PROPOFOL;  Surgeon: Ronnette Juniper, MD;   Location: [facility];  Service: Gastroenterology;  Laterality: Left;  . INGUINAL HERNIA REPAIR     "don't remeimber which side"  . PACEMAKER IMPLANT N/A 12/25/2016   Procedure: PACEMAKER IMPLANT;  Surgeon: Deboraha Sprang, MD;  Location: [facility];  Service: Cardiovascular;  Laterality: N/A;  . TONSILLECTOMY      MEDICATIONS: . acetaminophen (TYLENOL) 500 MG tablet  . allopurinol (ZYLOPRIM) 300 MG tablet  . amLODipine (NORVASC) 5 MG tablet  . atorvastatin (LIPITOR) 20 MG tablet  . b complex vitamins tablet  . fenofibrate micronized (LOFIBRA) 134 MG capsule  . ferrous sulfate 325 (65 FE) MG tablet  . furosemide (LASIX) 40 MG tablet  . losartan (COZAAR)  50 MG tablet  . metoprolol succinate (TOPROL-XL) 25 MG 24 hr tablet  . omeprazole (PRILOSEC) 20 MG capsule  . Rivaroxaban (XARELTO) 15 MG TABS tablet  . Tamsulosin HCl (FLOMAX) 0.4 MG CAPS   No current facility-administered medications for this encounter.    Maia Plan [facility] Pre-Surgical Testing [phone] 03/26/19  11:47 AM

## 2019-03-25 DIAGNOSIS — G4733 Obstructive sleep apnea (adult) (pediatric): Secondary | ICD-10-CM | POA: Diagnosis not present

## 2019-03-27 ENCOUNTER — Other Ambulatory Visit (HOSPITAL_COMMUNITY)
Admission: RE | Admit: 2019-03-27 | Discharge: 2019-03-27 | Disposition: A | Discharge status: Home / Self Care | Payer: Medicare Other | Source: Ambulatory Visit | Attending: Urology | Admitting: Urology

## 2019-03-27 DIAGNOSIS — Z01812 Encounter for preprocedural laboratory examination: Secondary | ICD-10-CM | POA: Diagnosis not present

## 2019-03-27 DIAGNOSIS — Z20822 Contact with and (suspected) exposure to covid-19: Secondary | ICD-10-CM | POA: Insufficient documentation

## 2019-03-27 LAB — SARS CORONAVIRUS 2 (TAT 6-24 HRS): SARS Coronavirus 2: NEGATIVE

## 2019-03-28 NOTE — Progress Notes (Signed)
Called patient about time change for surgery 03/31/19. He is to arrive 0800 for 1000 surgery. NPO after midnight except for medications with sip of water. He verbalizes understanding.

## 2019-03-31 ENCOUNTER — Inpatient Hospital Stay (HOSPITAL_COMMUNITY): Payer: Medicare Other | Admitting: Physician Assistant

## 2019-03-31 ENCOUNTER — Inpatient Hospital Stay (HOSPITAL_COMMUNITY): Payer: Medicare Other | Admitting: Certified Registered"

## 2019-03-31 ENCOUNTER — Encounter (HOSPITAL_COMMUNITY): Payer: Self-pay | Admitting: Urology

## 2019-03-31 ENCOUNTER — Inpatient Hospital Stay (HOSPITAL_COMMUNITY)
Admission: RE | Admit: 2019-03-31 | Discharge: 2019-04-05 | DRG: 657 | Disposition: A | Discharge status: Home Health | Payer: Medicare Other | Attending: Urology | Admitting: Urology

## 2019-03-31 ENCOUNTER — Encounter (HOSPITAL_COMMUNITY)
Admission: RE | Disposition: A | Discharge status: Home Health | Payer: Self-pay | Source: Home / Self Care | Attending: Urology

## 2019-03-31 DIAGNOSIS — N37 Urethral disorders in diseases classified elsewhere: Secondary | ICD-10-CM | POA: Diagnosis not present

## 2019-03-31 DIAGNOSIS — Z20822 Contact with and (suspected) exposure to covid-19: Secondary | ICD-10-CM | POA: Diagnosis present

## 2019-03-31 DIAGNOSIS — C641 Malignant neoplasm of right kidney, except renal pelvis: Principal | ICD-10-CM | POA: Diagnosis present

## 2019-03-31 DIAGNOSIS — Z950 Presence of cardiac pacemaker: Secondary | ICD-10-CM | POA: Diagnosis not present

## 2019-03-31 DIAGNOSIS — J9811 Atelectasis: Secondary | ICD-10-CM | POA: Diagnosis not present

## 2019-03-31 DIAGNOSIS — I130 Hypertensive heart and chronic kidney disease with heart failure and stage 1 through stage 4 chronic kidney disease, or unspecified chronic kidney disease: Secondary | ICD-10-CM | POA: Diagnosis not present

## 2019-03-31 DIAGNOSIS — Z6834 Body mass index (BMI) 34.0-34.9, adult: Secondary | ICD-10-CM

## 2019-03-31 DIAGNOSIS — Z7901 Long term (current) use of anticoagulants: Secondary | ICD-10-CM | POA: Diagnosis not present

## 2019-03-31 DIAGNOSIS — Z86718 Personal history of other venous thrombosis and embolism: Secondary | ICD-10-CM | POA: Diagnosis not present

## 2019-03-31 DIAGNOSIS — N183 Chronic kidney disease, stage 3 unspecified: Secondary | ICD-10-CM | POA: Diagnosis not present

## 2019-03-31 DIAGNOSIS — K219 Gastro-esophageal reflux disease without esophagitis: Secondary | ICD-10-CM | POA: Diagnosis present

## 2019-03-31 DIAGNOSIS — Z79899 Other long term (current) drug therapy: Secondary | ICD-10-CM | POA: Diagnosis not present

## 2019-03-31 DIAGNOSIS — D631 Anemia in chronic kidney disease: Secondary | ICD-10-CM | POA: Diagnosis present

## 2019-03-31 DIAGNOSIS — I509 Heart failure, unspecified: Secondary | ICD-10-CM | POA: Diagnosis present

## 2019-03-31 DIAGNOSIS — Z955 Presence of coronary angioplasty implant and graft: Secondary | ICD-10-CM | POA: Diagnosis not present

## 2019-03-31 DIAGNOSIS — I2510 Atherosclerotic heart disease of native coronary artery without angina pectoris: Secondary | ICD-10-CM | POA: Diagnosis present

## 2019-03-31 DIAGNOSIS — N35911 Unspecified urethral stricture, male, meatal: Secondary | ICD-10-CM | POA: Diagnosis present

## 2019-03-31 DIAGNOSIS — E669 Obesity, unspecified: Secondary | ICD-10-CM | POA: Diagnosis present

## 2019-03-31 DIAGNOSIS — D62 Acute posthemorrhagic anemia: Secondary | ICD-10-CM | POA: Diagnosis not present

## 2019-03-31 DIAGNOSIS — E7800 Pure hypercholesterolemia, unspecified: Secondary | ICD-10-CM | POA: Diagnosis not present

## 2019-03-31 DIAGNOSIS — I4891 Unspecified atrial fibrillation: Secondary | ICD-10-CM | POA: Diagnosis not present

## 2019-03-31 DIAGNOSIS — R0902 Hypoxemia: Secondary | ICD-10-CM | POA: Diagnosis not present

## 2019-03-31 DIAGNOSIS — M109 Gout, unspecified: Secondary | ICD-10-CM | POA: Diagnosis present

## 2019-03-31 DIAGNOSIS — Z87891 Personal history of nicotine dependence: Secondary | ICD-10-CM | POA: Diagnosis not present

## 2019-03-31 DIAGNOSIS — N179 Acute kidney failure, unspecified: Secondary | ICD-10-CM | POA: Diagnosis present

## 2019-03-31 DIAGNOSIS — I252 Old myocardial infarction: Secondary | ICD-10-CM

## 2019-03-31 DIAGNOSIS — Z951 Presence of aortocoronary bypass graft: Secondary | ICD-10-CM | POA: Diagnosis not present

## 2019-03-31 DIAGNOSIS — N2889 Other specified disorders of kidney and ureter: Secondary | ICD-10-CM | POA: Diagnosis not present

## 2019-03-31 DIAGNOSIS — G4733 Obstructive sleep apnea (adult) (pediatric): Secondary | ICD-10-CM | POA: Diagnosis present

## 2019-03-31 DIAGNOSIS — I5043 Acute on chronic combined systolic (congestive) and diastolic (congestive) heart failure: Secondary | ICD-10-CM | POA: Diagnosis not present

## 2019-03-31 DIAGNOSIS — N400 Enlarged prostate without lower urinary tract symptoms: Secondary | ICD-10-CM | POA: Diagnosis present

## 2019-03-31 DIAGNOSIS — E785 Hyperlipidemia, unspecified: Secondary | ICD-10-CM | POA: Diagnosis present

## 2019-03-31 DIAGNOSIS — D49511 Neoplasm of unspecified behavior of right kidney: Secondary | ICD-10-CM | POA: Diagnosis not present

## 2019-03-31 HISTORY — PX: CYSTOSCOPY: SHX5120

## 2019-03-31 HISTORY — PX: LAPAROSCOPIC NEPHRECTOMY: SHX1930

## 2019-03-31 LAB — TYPE AND SCREEN
ABO/RH(D): O POS
Antibody Screen: NEGATIVE

## 2019-03-31 LAB — HEMOGLOBIN AND HEMATOCRIT, BLOOD
HCT: 38.5 % — ABNORMAL LOW (ref 39.0–52.0)
Hemoglobin: 11.5 g/dL — ABNORMAL LOW (ref 13.0–17.0)

## 2019-03-31 SURGERY — NEPHRECTOMY, RADICAL, LAPAROSCOPIC, ADULT
Anesthesia: General | Laterality: Right

## 2019-03-31 MED ORDER — ROCURONIUM BROMIDE 10 MG/ML (PF) SYRINGE
PREFILLED_SYRINGE | INTRAVENOUS | Status: AC
  Filled 2019-03-31: qty 10

## 2019-03-31 MED ORDER — SENNA 8.6 MG PO TABS
1.0000 | ORAL_TABLET | Freq: Two times a day (BID) | ORAL | Status: DC
  Administered 2019-03-31 – 2019-04-05 (×10): 8.6 mg via ORAL
  Filled 2019-03-31 (×10): qty 1

## 2019-03-31 MED ORDER — PROPOFOL 10 MG/ML IV BOLUS
INTRAVENOUS | Status: AC
  Filled 2019-03-31: qty 20

## 2019-03-31 MED ORDER — ATORVASTATIN CALCIUM 20 MG PO TABS
20.0000 mg | ORAL_TABLET | Freq: Every day | ORAL | Status: DC
  Administered 2019-04-01 – 2019-04-05 (×5): 20 mg via ORAL
  Filled 2019-03-31 (×5): qty 1

## 2019-03-31 MED ORDER — PHENYLEPHRINE HCL (PRESSORS) 10 MG/ML IV SOLN
INTRAVENOUS | Status: AC
  Filled 2019-03-31: qty 1

## 2019-03-31 MED ORDER — OXYCODONE HCL 5 MG PO TABS
5.0000 mg | ORAL_TABLET | ORAL | Status: DC | PRN
  Administered 2019-03-31 – 2019-04-02 (×4): 5 mg via ORAL
  Filled 2019-03-31 (×5): qty 1

## 2019-03-31 MED ORDER — FENTANYL CITRATE (PF) 100 MCG/2ML IJ SOLN
25.0000 ug | INTRAMUSCULAR | Status: DC | PRN
  Administered 2019-03-31 (×2): 50 ug via INTRAVENOUS

## 2019-03-31 MED ORDER — SODIUM CHLORIDE 0.9% FLUSH
INTRAVENOUS | Status: DC | PRN
  Administered 2019-03-31: 20 mL

## 2019-03-31 MED ORDER — 0.9 % SODIUM CHLORIDE (POUR BTL) OPTIME
TOPICAL | Status: DC | PRN
  Administered 2019-03-31: 1000 mL

## 2019-03-31 MED ORDER — EPHEDRINE 5 MG/ML INJ
INTRAVENOUS | Status: AC
  Filled 2019-03-31: qty 10

## 2019-03-31 MED ORDER — FENOFIBRATE 160 MG PO TABS
160.0000 mg | ORAL_TABLET | Freq: Every day | ORAL | Status: DC
  Administered 2019-04-01 – 2019-04-05 (×5): 160 mg via ORAL
  Filled 2019-03-31 (×5): qty 1

## 2019-03-31 MED ORDER — DOCUSATE SODIUM 100 MG PO CAPS
100.0000 mg | ORAL_CAPSULE | Freq: Two times a day (BID) | ORAL | Status: DC
  Administered 2019-03-31 – 2019-04-05 (×10): 100 mg via ORAL
  Filled 2019-03-31 (×10): qty 1

## 2019-03-31 MED ORDER — TAMSULOSIN HCL 0.4 MG PO CAPS
0.4000 mg | ORAL_CAPSULE | Freq: Every day | ORAL | Status: DC
  Administered 2019-04-01 – 2019-04-05 (×5): 0.4 mg via ORAL
  Filled 2019-03-31 (×5): qty 1

## 2019-03-31 MED ORDER — ONDANSETRON HCL 4 MG/2ML IJ SOLN: 4.0000 mg | INTRAMUSCULAR | Status: DC | PRN

## 2019-03-31 MED ORDER — ALLOPURINOL 300 MG PO TABS
300.0000 mg | ORAL_TABLET | Freq: Every day | ORAL | Status: DC
  Administered 2019-04-01 – 2019-04-02 (×2): 300 mg via ORAL
  Filled 2019-03-31 (×2): qty 1

## 2019-03-31 MED ORDER — SODIUM CHLORIDE (PF) 0.9 % IJ SOLN
INTRAMUSCULAR | Status: AC
  Filled 2019-03-31: qty 20

## 2019-03-31 MED ORDER — PANTOPRAZOLE SODIUM 40 MG PO TBEC
40.0000 mg | DELAYED_RELEASE_TABLET | Freq: Every day | ORAL | Status: DC
  Administered 2019-03-31 – 2019-04-05 (×6): 40 mg via ORAL
  Filled 2019-03-31 (×6): qty 1

## 2019-03-31 MED ORDER — DIPHENHYDRAMINE HCL 50 MG/ML IJ SOLN: 12.5000 mg | Freq: Four times a day (QID) | INTRAMUSCULAR | Status: DC | PRN

## 2019-03-31 MED ORDER — SODIUM CHLORIDE 0.9 % IV SOLN: INTRAVENOUS | Status: DC

## 2019-03-31 MED ORDER — LIDOCAINE 2% (20 MG/ML) 5 ML SYRINGE
INTRAMUSCULAR | Status: AC
  Filled 2019-03-31: qty 5

## 2019-03-31 MED ORDER — FENTANYL CITRATE (PF) 100 MCG/2ML IJ SOLN
INTRAMUSCULAR | Status: AC
  Filled 2019-03-31: qty 2

## 2019-03-31 MED ORDER — DEXAMETHASONE SODIUM PHOSPHATE 10 MG/ML IJ SOLN
INTRAMUSCULAR | Status: DC | PRN
  Administered 2019-03-31: 4 mg via INTRAVENOUS

## 2019-03-31 MED ORDER — LACTATED RINGERS IR SOLN
Status: DC | PRN
  Administered 2019-03-31: 1000 mL

## 2019-03-31 MED ORDER — EPHEDRINE SULFATE-NACL 50-0.9 MG/10ML-% IV SOSY
PREFILLED_SYRINGE | INTRAVENOUS | Status: DC | PRN
  Administered 2019-03-31 (×2): 5 mg via INTRAVENOUS

## 2019-03-31 MED ORDER — MORPHINE SULFATE (PF) 2 MG/ML IV SOLN: 2.0000 mg | INTRAVENOUS | Status: DC | PRN

## 2019-03-31 MED ORDER — LIDOCAINE 2% (20 MG/ML) 5 ML SYRINGE
INTRAMUSCULAR | Status: DC | PRN
  Administered 2019-03-31: 60 mg via INTRAVENOUS

## 2019-03-31 MED ORDER — CEFAZOLIN SODIUM-DEXTROSE 2-4 GM/100ML-% IV SOLN
2.0000 g | Freq: Three times a day (TID) | INTRAVENOUS | Status: AC
  Administered 2019-03-31 – 2019-04-01 (×2): 2 g via INTRAVENOUS
  Filled 2019-03-31 (×2): qty 100

## 2019-03-31 MED ORDER — DIPHENHYDRAMINE HCL 12.5 MG/5ML PO ELIX: 12.5000 mg | ORAL_SOLUTION | Freq: Four times a day (QID) | ORAL | Status: DC | PRN

## 2019-03-31 MED ORDER — BELLADONNA ALKALOIDS-OPIUM 16.2-60 MG RE SUPP: 1.0000 | Freq: Four times a day (QID) | RECTAL | Status: DC | PRN

## 2019-03-31 MED ORDER — ONDANSETRON HCL 4 MG/2ML IJ SOLN: 4.0000 mg | Freq: Once | INTRAMUSCULAR | Status: DC | PRN

## 2019-03-31 MED ORDER — ZOLPIDEM TARTRATE 5 MG PO TABS: 5.0000 mg | ORAL_TABLET | Freq: Every evening | ORAL | Status: DC | PRN

## 2019-03-31 MED ORDER — LOSARTAN POTASSIUM 50 MG PO TABS
50.0000 mg | ORAL_TABLET | Freq: Every day | ORAL | Status: DC
  Administered 2019-03-31 – 2019-04-01 (×2): 50 mg via ORAL
  Filled 2019-03-31 (×2): qty 1

## 2019-03-31 MED ORDER — OXYBUTYNIN CHLORIDE 5 MG PO TABS: 5.0000 mg | ORAL_TABLET | Freq: Three times a day (TID) | ORAL | Status: DC | PRN

## 2019-03-31 MED ORDER — LACTATED RINGERS IV SOLN: INTRAVENOUS | Status: DC

## 2019-03-31 MED ORDER — ONDANSETRON HCL 4 MG/2ML IJ SOLN
INTRAMUSCULAR | Status: AC
  Filled 2019-03-31: qty 2

## 2019-03-31 MED ORDER — ACETAMINOPHEN 500 MG PO TABS
1000.0000 mg | ORAL_TABLET | Freq: Once | ORAL | Status: AC
  Administered 2019-03-31: 1000 mg via ORAL
  Filled 2019-03-31: qty 2

## 2019-03-31 MED ORDER — CEFAZOLIN SODIUM-DEXTROSE 2-4 GM/100ML-% IV SOLN
2.0000 g | INTRAVENOUS | Status: AC
  Administered 2019-03-31: 2 g via INTRAVENOUS
  Filled 2019-03-31: qty 100

## 2019-03-31 MED ORDER — FENTANYL CITRATE (PF) 250 MCG/5ML IJ SOLN
INTRAMUSCULAR | Status: DC | PRN
  Administered 2019-03-31: 25 ug via INTRAVENOUS
  Administered 2019-03-31: 50 ug via INTRAVENOUS
  Administered 2019-03-31: 25 ug via INTRAVENOUS

## 2019-03-31 MED ORDER — ACETAMINOPHEN 325 MG PO TABS
650.0000 mg | ORAL_TABLET | ORAL | Status: DC | PRN
  Administered 2019-04-03: 650 mg via ORAL
  Filled 2019-03-31: qty 2

## 2019-03-31 MED ORDER — FUROSEMIDE 20 MG PO TABS
20.0000 mg | ORAL_TABLET | Freq: Every day | ORAL | Status: DC
  Administered 2019-03-31 – 2019-04-01 (×2): 20 mg via ORAL
  Filled 2019-03-31 (×2): qty 1

## 2019-03-31 MED ORDER — BUPIVACAINE LIPOSOME 1.3 % IJ SUSP
20.0000 mL | Freq: Once | INTRAMUSCULAR | Status: AC
  Administered 2019-03-31: 20 mL
  Filled 2019-03-31: qty 20

## 2019-03-31 MED ORDER — PROPOFOL 10 MG/ML IV BOLUS
INTRAVENOUS | Status: DC | PRN
  Administered 2019-03-31: 100 mg via INTRAVENOUS

## 2019-03-31 MED ORDER — ONDANSETRON HCL 4 MG/2ML IJ SOLN
INTRAMUSCULAR | Status: DC | PRN
  Administered 2019-03-31: 4 mg via INTRAVENOUS

## 2019-03-31 MED ORDER — ROCURONIUM BROMIDE 10 MG/ML (PF) SYRINGE
PREFILLED_SYRINGE | INTRAVENOUS | Status: DC | PRN
  Administered 2019-03-31: 60 mg via INTRAVENOUS
  Administered 2019-03-31 (×3): 20 mg via INTRAVENOUS

## 2019-03-31 MED ORDER — SODIUM CHLORIDE 0.9 % IV SOLN
INTRAVENOUS | Status: DC | PRN
  Administered 2019-03-31: 20 ug/min via INTRAVENOUS

## 2019-03-31 MED ORDER — DEXAMETHASONE SODIUM PHOSPHATE 10 MG/ML IJ SOLN
INTRAMUSCULAR | Status: AC
  Filled 2019-03-31: qty 1

## 2019-03-31 MED ORDER — AMLODIPINE BESYLATE 5 MG PO TABS
7.5000 mg | ORAL_TABLET | Freq: Every day | ORAL | Status: DC
  Administered 2019-04-01: 7.5 mg via ORAL
  Filled 2019-03-31: qty 2

## 2019-03-31 MED ORDER — METOPROLOL SUCCINATE ER 25 MG PO TB24
25.0000 mg | ORAL_TABLET | Freq: Every day | ORAL | Status: DC
  Administered 2019-04-01 – 2019-04-05 (×5): 25 mg via ORAL
  Filled 2019-03-31 (×5): qty 1

## 2019-03-31 SURGICAL SUPPLY — 65 items
BAG LAPAROSCOPIC 12 15 PORT 16 (BASKET) ×2 IMPLANT
BAG RETRIEVAL 12/15 (BASKET) ×3
BAG ZIPLOCK 12X15 (MISCELLANEOUS) ×3 IMPLANT
BLADE EXTENDED COATED 6.5IN (ELECTRODE) IMPLANT
BLADE SURG SZ10 CARB STEEL (BLADE) IMPLANT
CABLE HIGH FREQUENCY MONO STRZ (ELECTRODE) ×3 IMPLANT
CATH FOLEY 2WAY SLVR  5CC 14FR (CATHETERS) ×3
CATH FOLEY 2WAY SLVR  5CC 16FR (CATHETERS) ×3
CATH FOLEY 2WAY SLVR 5CC 14FR (CATHETERS) ×2 IMPLANT
CATH FOLEY 2WAY SLVR 5CC 16FR (CATHETERS) ×2 IMPLANT
CATH FOLEY INTRO SUPRA 16F (CATHETERS) ×6 IMPLANT
CHLORAPREP W/TINT 26 (MISCELLANEOUS) ×3 IMPLANT
CLEANER TIP ELECTROSURG 2X2 (MISCELLANEOUS) IMPLANT
CLIP VESOLOCK LG 6/CT PURPLE (CLIP) ×3 IMPLANT
CLIP VESOLOCK MED LG 6/CT (CLIP) IMPLANT
CLIP VESOLOCK XL 6/CT (CLIP) IMPLANT
COVER SURGICAL LIGHT HANDLE (MISCELLANEOUS) ×3 IMPLANT
COVER WAND RF STERILE (DRAPES) IMPLANT
CUTTER FLEX LINEAR 45M (STAPLE) IMPLANT
DERMABOND ADVANCED (GAUZE/BANDAGES/DRESSINGS)
DERMABOND ADVANCED .7 DNX12 (GAUZE/BANDAGES/DRESSINGS) IMPLANT
DRAIN CHANNEL 10F 3/8 F FF (DRAIN) IMPLANT
DRAPE INCISE IOBAN 66X45 STRL (DRAPES) ×3 IMPLANT
DRSG TEGADERM 4X4.75 (GAUZE/BANDAGES/DRESSINGS) IMPLANT
ELECT REM PT RETURN 15FT ADLT (MISCELLANEOUS) ×3 IMPLANT
EVACUATOR SILICONE 100CC (DRAIN) IMPLANT
GAUZE SPONGE 4X4 12PLY STRL (GAUZE/BANDAGES/DRESSINGS) ×3 IMPLANT
GLOVE BIO SURGEON STRL SZ 6.5 (GLOVE) ×3 IMPLANT
GLOVE BIO SURGEON STRL SZ7.5 (GLOVE) ×3 IMPLANT
GOWN STRL REUS W/TWL LRG LVL3 (GOWN DISPOSABLE) ×6 IMPLANT
GOWN STRL REUS W/TWL XL LVL3 (GOWN DISPOSABLE) ×3 IMPLANT
GUIDEWIRE ANG ZIPWIRE 038X150 (WIRE) ×9 IMPLANT
GUIDEWIRE STR DUAL SENSOR (WIRE) ×3 IMPLANT
HANDLE STAPLE EGIA 4 XL (STAPLE) ×3 IMPLANT
HEMOSTAT SURGICEL 4X8 (HEMOSTASIS) ×3 IMPLANT
IRRIG SUCT STRYKERFLOW 2 WTIP (MISCELLANEOUS)
IRRIGATION SUCT STRKRFLW 2 WTP (MISCELLANEOUS) IMPLANT
KIT BASIN OR (CUSTOM PROCEDURE TRAY) ×3 IMPLANT
KIT TURNOVER KIT A (KITS) IMPLANT
LIGASURE VESSEL 5MM BLUNT TIP (ELECTROSURGICAL) IMPLANT
POUCH SPECIMEN RETRIEVAL 10MM (ENDOMECHANICALS) IMPLANT
RELOAD 45 VASCULAR/THIN (ENDOMECHANICALS) IMPLANT
RELOAD EGIA 45 TAN VASC (STAPLE) ×3 IMPLANT
SCISSORS LAP 5X35 DISP (ENDOMECHANICALS) IMPLANT
SET TUBE SMOKE EVAC HIGH FLOW (TUBING) ×3 IMPLANT
SPONGE LAP 18X18 RF (DISPOSABLE) IMPLANT
STAPLER VISISTAT 35W (STAPLE) IMPLANT
SURGIFLO W/THROMBIN 8M KIT (HEMOSTASIS) IMPLANT
SUT ETHILON 3 0 PS 1 (SUTURE) ×3 IMPLANT
SUT MNCRL AB 4-0 PS2 18 (SUTURE) IMPLANT
SUT PDS AB 1 TP1 96 (SUTURE) ×6 IMPLANT
SUT VIC AB 2-0 SH 27 (SUTURE) ×3
SUT VIC AB 2-0 SH 27X BRD (SUTURE) ×2 IMPLANT
SUT VICRYL 0 UR6 27IN ABS (SUTURE) IMPLANT
SYR CONTROL 10ML LL (SYRINGE) ×12 IMPLANT
SYS LAPSCP GELPORT 120MM (MISCELLANEOUS) ×3
SYSTEM LAPSCP GELPORT 120MM (MISCELLANEOUS) ×2 IMPLANT
TAPE CLOTH SURG 4X10 WHT LF (GAUZE/BANDAGES/DRESSINGS) ×3 IMPLANT
TOWEL OR 17X26 10 PK STRL BLUE (TOWEL DISPOSABLE) ×3 IMPLANT
TRAY FOLEY MTR SLVR 16FR STAT (SET/KITS/TRAYS/PACK) ×3 IMPLANT
TRAY LAPAROSCOPIC (CUSTOM PROCEDURE TRAY) ×3 IMPLANT
TROCAR BLADELESS OPT 5 100 (ENDOMECHANICALS) IMPLANT
TROCAR UNIVERSAL OPT 12M 100M (ENDOMECHANICALS) ×3 IMPLANT
TROCAR XCEL 12X100 BLDLESS (ENDOMECHANICALS) ×6 IMPLANT
YANKAUER SUCT BULB TIP 10FT TU (MISCELLANEOUS) ×3 IMPLANT

## 2019-03-31 NOTE — Op Note (Signed)
Operative Note  Preoperative diagnosis:  1.  Right renal mass  Postoperative diagnosis: 1.  Right renal mass 2.  Urethral stricture  Procedure(s): 1.  Cystoscopy, diagnostic 2.  Suprapubic tube placement  3.  Right hand assisted laparoscopic radical nephrectomy  Surgeon: Link Snuffer, MD  Assistants: Simone Curia pace, MD an assistant was needed throughout the case further expertise in assisting with a laparoscopic surgery, including visualization with the camera, passing instruments, etc.  Nurse practitioner: Jeralene Huff  Anesthesia: General  Complications: None  EBL: 100 cc  Specimens: 1.  Right kidney  Drains/Catheters: 1.  16 French suprapubic catheter  Intraoperative findings: 1.  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  There was moderate amount of scarring.  He had meatal stenosis which did not allow for passage of the catheter.  After gently dilating the meatus, a catheter still would not pass.  Cystourethroscopy could not identify a true lumen about an inch past the meatus.  Despite multiple attempts, I was unable to pass a wire into the bladder and therefore suprapubic tube was placed. 2.  Kidney removed entirely  Indication: 79 year old male who was found on imaging to have a right renal mass concerning for renal cell carcinoma.  After discussion of different options, the patient elected to undergo the above operation.  Specifically, the patient due to his renal function could not have a contrasted scan.  He has a pacemaker which did not allow for an MRI.  Renal ultrasound that was performed showed a complex mass concerning for solid neoplasm.  Offered the patient biopsy of the renal mass by interventional radiology but he declined and just wanted to have a nephrectomy with the understanding that there is a possibility that this could represent a benign mass  Description of procedure:  The patient was identified and consent was obtained.   The patient was taken to the operating room and placed in the supine position.  The patient was placed under general anesthesia.  Perioperative antibiotics were given and timeout was performed.  A Foley catheter attempted be placed.  He had meatal stenosis.  Therefore, I gently dilated the meatus with urethral sounds.  I then tried to pass a catheter.  This again met resistance.  Therefore advanced a 41 French flexible cystoscope passed the meatus.  There was noted to be a false passage and scarring of the urethra.  I could not find a true lumen despite multiple attempts with a wire and with cystoscopy.  Therefore, decision was made to proceed with suprapubic tube placement.  Under ultrasound guidance, identified the bladder 2 fingerbreadths above the pubic bone.  I advanced a spinal needle into the bladder and aspirated urine confirming proper positioning.  I made a 1 cm incision beside the spinal needle.  I removed the spinal needle and then under ultrasound guidance I advanced a suprapubic tube kit with the sharp trocar in place and advanced that into the bladder.  I withdrew the inner trocar keeping the sheath in place and there was return of light red urine.  I advanced a 25 French catheter through the sheath and into the bladder and instilled 10 cc of sterile water into the catheter balloon followed by removal of the sheath.  I secured the catheter down with a nylon suture.   The patient was placed in right lateral position at approximately 65 degrees and all pressure points were padded.  Patient was prepped and draped in a standard sterile fashion and  another timeout was performed.  An 8 cm periumbilical incision was made sharply into the skin.  This was carried down with Bovie electrocautery down to the anterior rectus sheath which was divided with electrocautery.  The underlying musculature was separated in the midline.  Sharp dissection with Metzenbaum scissors was used to open up the posterior sheath  and peritoneum.  This was extended with electrocautery taking great care not to use cautery near the bowel.  The hand assist port was secured into the incision.  I made sure no bowel was trapped within this.  A 12 mm port was inserted through the hand assist port and the abdomen was insufflated to a pressure of 15.  A 12 mm port was placed lateral as well as superior to the hand assist port, each 1 about a hand width away. Please note that all ports were placed under direct visualization with the camera.  The colon was first dissected medially by incising along the white line of [place].  After medializing the colon, the kidney was dissected laterally and medially as well as superiorly.  Inferior attachments as well as the ureter and gonadal vein were divided with LigaSure device. I continued to carefully dissect medially and identified the renal hilum.  The renal vein and renal artery were divided with a 45 mm vascular staple load.  Superior attachments were then released using LigaSure device as well as blunt dissection.  Once the entire kidney and surrounding Gerota's fascia was freed, the specimen was withdrawn from the midline incision and passed off for permanent specimen.  The abdomen was reinspected and no active bleeding was noted.  Surgicel was applied to the nephrectomy bed.  The midline fascia was closed with running 0 looped PDS suture followed by staples.  The port incisions were closed with a 2-0 Vicryl followed by staples.  Exparel was instilled for anesthetic effect.  A dressing was applied.  Patient tolerated the procedure well and was stable postoperatively.  Plan: Stat labs will be obtained.  Anticipate the patient will be in the hospital 1-2 nights as long as he does well.  I will likely consult with his nephrologist.

## 2019-03-31 NOTE — Anesthesia Preprocedure Evaluation (Addendum)
Anesthesia Evaluation  Patient identified by MRN, date of birth, ID band Patient awake    Reviewed: Allergy & Precautions, NPO status , Patient's Chart, lab work & pertinent test results, reviewed documented beta blocker date and time   Airway Mallampati: III       Dental  (+) Teeth Intact, Dental Advisory Given   Pulmonary sleep apnea and Continuous Positive Airway Pressure Ventilation , former smoker,    Pulmonary exam normal        Cardiovascular hypertension, Pt. on medications and Pt. on home beta blockers + CAD, + Past MI, + Cardiac Stents, + CABG, + Peripheral Vascular Disease, +CHF and + DVT  Normal cardiovascular exam+ dysrhythmias Atrial Fibrillation and Supra Ventricular Tachycardia  Rhythm:Regular Rate:Normal  CAD s/p CABG 1992, LAD DES 2012  TTE 08/09/16 - Left ventricle: The estimated ejection fraction was in the range of 45% to 50%. Diffuse hypokinesis. - No other notable features in report.  Stress test 2018: The left ventricular ejection fraction is mildly decreased (45-54%). Nuclear stress EF is calculated at 47% but appears to be at 55%. There was no ST segment deviation noted during stress. There is a small defect of mild severity present in the mid anterior and apical anterior location. 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 No ischemia noted. This is a low risk study.    Afib- on xarelto, metoprolol  PPM 2018 for sinus bradycardia- last interrogated one month ago per pt   Neuro/Psych negative neurological ROS     GI/Hepatic Neg liver ROS, GERD  Controlled and Medicated,  Endo/Other  Obesity BMI 34  Renal/GU CRFRenal diseaseRight renal mass  Last cr 2  negative genitourinary   Musculoskeletal  (+) Arthritis , Spinal stenosis- lumbar   Abdominal (+) + obese,   Peds  Hematology  (+) Blood dyscrasia, anemia , Iron deficiency anemia- last hct 39   Anesthesia Other  Findings   Reproductive/Obstetrics negative OB ROS                            Anesthesia Physical Anesthesia Plan  ASA: IV  Anesthesia Plan: General   Post-op Pain Management:    Induction: Intravenous  PONV Risk Score and Plan: 3 and Ondansetron, Dexamethasone and Treatment may vary due to age or medical condition  Airway Management Planned: Oral ETT and Video Laryngoscope Planned  Additional Equipment: None  Intra-op Plan:   Post-operative Plan: Extubation in OR  Informed Consent: I have reviewed the patients History and Physical, chart, labs and discussed the procedure including the risks, benefits and alternatives for the proposed anesthesia with the patient or authorized representative who has indicated his/her understanding and acceptance.     Dental advisory given  Plan Discussed with: CRNA  Anesthesia Plan Comments: (Active type and screen, d/w patient possibility of needing arterial line and blood transfusion intra-operatively)       Anesthesia Quick Evaluation

## 2019-03-31 NOTE — Anesthesia Postprocedure Evaluation (Signed)
Anesthesia Post Note  Patient: Paul Price  Procedure(s) Performed: RIGHT  HAND ASSIST LAPAROSCOPIC NEPHRECTOMY (Right ) CYSTOSCOPY FLEXIBLE (N/A )     Patient location during evaluation: PACU Anesthesia Type: General Level of consciousness: awake and sedated Pain management: pain level controlled Vital Signs Assessment: post-procedure vital signs reviewed and stable Respiratory status: spontaneous breathing Cardiovascular status: stable Postop Assessment: no apparent nausea or vomiting Anesthetic complications: no    Last Vitals:  Vitals:   03/31/19 1545 03/31/19 1600  BP: 125/60 134/62  Pulse: (!) 59 61  Resp: 14 13  Temp: (!) 36.3 C (!) 36.3 C  SpO2: 96% 96%    Last Pain:  Vitals:   03/31/19 1600  TempSrc:   PainSc: Asleep   Pain Goal:                   Huston Foley

## 2019-03-31 NOTE — Anesthesia Procedure Notes (Signed)
Procedure Name: Intubation Date/Time: 03/31/2019 10:12 AM Performed by: Eben Burow, CRNA Pre-anesthesia Checklist: Patient identified, Emergency Drugs available, Suction available, Patient being monitored and Timeout performed Patient Re-evaluated:Patient Re-evaluated prior to induction Oxygen Delivery Method: Circle system utilized Preoxygenation: Pre-oxygenation with 100% oxygen Induction Type: IV induction Ventilation: Mask ventilation without difficulty Laryngoscope Size: Glidescope and 4 Grade View: Grade I Tube type: Oral Tube size: 7.5 mm Number of attempts: 1 Airway Equipment and Method: Stylet Placement Confirmation: ETT inserted through vocal cords under direct vision,  positive ETCO2 and breath sounds checked- equal and bilateral Secured at: 22 cm Tube secured with: Tape Dental Injury: Teeth and Oropharynx as per pre-operative assessment

## 2019-03-31 NOTE — Transfer of Care (Signed)
Immediate Anesthesia Transfer of Care Note  Patient: Paul Price  Procedure(s) Performed: RIGHT  HAND ASSIST LAPAROSCOPIC NEPHRECTOMY (Right ) CYSTOSCOPY FLEXIBLE (N/A )  Patient Location: PACU  Anesthesia Type:General  Level of Consciousness: awake, drowsy and patient cooperative  Airway & Oxygen Therapy: Patient Spontanous Breathing and Patient connected to face mask oxygen  Post-op Assessment: Report given to RN and Post -op Vital signs reviewed and stable  Post vital signs: Reviewed and stable  Last Vitals:  Vitals Value Taken Time  BP 120/55 03/31/19 1336  Temp    Pulse 60 03/31/19 1340  Resp 14 03/31/19 1340  SpO2 100 % 03/31/19 1340  Vitals shown include unvalidated device data.  Last Pain:  Vitals:   03/31/19 0805  TempSrc:   PainSc: 0-No pain         Complications: No apparent anesthesia complications

## 2019-03-31 NOTE — H&P (Signed)
CC/HPI: CC: Right renal mass  HPI:  02/06/2019  79 year old male who I followed in the past for meatal stenosis. He is doing okay in this regard. However, he was recently evaluated for chronic renal insufficiency. Renal ultrasound was performed that showed a 7 cm complex heterogeneous mass in the upper pole of the right kidney that was predominantly solid in appearance concerning for renal cell carcinoma. He is unable to receive contrast since his last GFR was 30. He is unable to get an MRI because he has a pacemaker. He has not had any other imaging. He denies any hematuria or dysuria. He also had some small left renal cyst. Last renal ultrasound was in 2015. This revealed multiple small cyst the largest of which was 3 cm in the lower pole of the right kidney.     ALLERGIES: Nitroglycerin SUBL    MEDICATIONS: Metoprolol Succinate  Omeprazole  Tamsulosin Hcl 0.4 mg capsule TAKE 1 CAPSULE BY MOUTH ONCE DAILY  Allopurinol 300 mg tablet Oral  Amlodipine Besylate 10 mg tablet Oral  Atorvastatin Calcium 10 mg tablet Oral  Fenofibrate Micronized 134 MG Oral Capsule Oral  Ferrous Sulfate  Furosemide 40 mg tablet Oral  Losartan Potassium 50 mg tablet  Pantoprazole Sodium 40 mg tablet, delayed release  Potassium Chloride 20 meq tablet, extended release  Tamsulosin HCl - 0.4 MG Oral Capsule 0 Oral Daily  Tylenol  Xarelto 15 mg tablet     GU PSH: Cysto Dilate Stricture (M or F) - 2011       PSH Notes: Cystoscopy For Urethral Stricture, Coronary Artery Bypass Graft (CABG)   NON-GU PSH: CABG (coronary artery bypass grafting) - 2011 Pacemaker placement     GU PMH: Incomplete bladder emptying - 10/05/2017 Balanitis Xerotica Obliterans (Lichen sclerosis), Balanitis xerotica obliterans - 2016 BPH w/LUTS, 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, Unspec, Meatal stenosis - 2016 Urinary Frequency, Increased urinary frequency - 2016 ED due to arterial  insufficiency, Erectile dysfunction due to arterial insufficiency - 2014 Gross hematuria, Gross hematuria - 2014 Renal cyst, Renal cysts, acquired, bilateral - 2014 Urinary Retention, Unspec, Urinary retention - 2014 Urinary Tract Inf, Unspec site, Urinary tract infection - 2014      PMH Notes:  2009-07-01 14:03:29 - Note: Coronary Artery Disease  2009-07-01 14:15:39 - Note: Gout  2009-07-01 14:15:39 - Note: Arthritis   NON-GU PMH: Unspecified anterior urethral stricture, male - 10/05/2017 Encounter for general adult medical examination without abnormal findings, Encounter for preventive health examination - 2016 Personal history of other diseases of the circulatory system, History of hypertension - 2014 Personal history of other diseases of the digestive system, History of esophageal reflux - 2014 Personal history of other diseases of the nervous system and sense organs, History of sleep apnea - 2014 Personal history of other endocrine, nutritional and metabolic disease, History of hypercholesterolemia - 2014    FAMILY HISTORY: Acute Myocardial Infarction - Father Family Health Status Number - Father Father Deceased At Age47 ___ - Runs In Family Mother Deceased At Age 56 from diabetic complicati - Runs In Family nephrolithiasis - Son   SOCIAL HISTORY: Marital Status: Married Preferred Language: English; Race: White Current Smoking Status: Patient does not smoke anymore. Has not smoked since 09/09/1957.   Tobacco Use Assessment Completed: Used Tobacco in last 30 days? Does not drink caffeine.     Notes: Former smoker, Tobacco Use, Alcohol Use, Occupation:, Caffeine Use, Marital History - Currently Married   REVIEW OF SYSTEMS:  GU Review Male:   Patient denies frequent urination, hard to postpone urination, burning/ pain with urination, get up at night to urinate, leakage of urine, stream starts and stops, trouble starting your stream, have to strain to urinate , erection problems,  and penile pain.  Gastrointestinal (Upper):   Patient denies nausea, vomiting, and indigestion/ heartburn.  Gastrointestinal (Lower):   Patient denies diarrhea and constipation.  Constitutional:   Patient denies fever, night sweats, weight loss, and fatigue.  Skin:   Patient denies skin rash/ lesion and itching.  Eyes:   Patient denies blurred vision and double vision.  Ears/ Nose/ Throat:   Patient denies sore throat and sinus problems.  Hematologic/Lymphatic:   Patient denies swollen glands and easy bruising.  Cardiovascular:   Patient denies leg swelling and chest pains.  Respiratory:   Patient denies cough and shortness of breath.  Endocrine:   Patient denies excessive thirst.  Musculoskeletal:   Patient denies back pain and joint pain.  Neurological:   Patient denies headaches and dizziness.  Psychologic:   Patient denies depression and anxiety.   Notes: renal mass    VITAL SIGNS:      02/06/2019 03:14 PM  BP 158/78 mmHg  Heart Rate 73 /min  Temperature 97.7 F / 36.5 C   PAST DATA REVIEWED:  Source Of History:  Patient  Lab Test Review:   BMP  Records Review:   Previous Doctor Records, Previous Patient Records  Urine Test Review:   Urinalysis  X-Ray Review: Renal Ultrasound: Reviewed Films. Reviewed Report. Discussed With Patient.     12/16/15 10/10/14  PSA  Total PSA 3.63  4.21   Free PSA  0.81   % Free PSA  19     08/02/09  Hormones  Testosterone, Total 268.0     PROCEDURES:          Urinalysis w/Scope Dipstick Dipstick Cont'd Micro  Color: Yellow Bilirubin: Neg mg/dL WBC/hpf: 20 - 40/hpf  Appearance: Clear Ketones: Neg mg/dL RBC/hpf: NS (Not Seen)  Specific Gravity: 1.025 Blood: Neg ery/uL Bacteria: Few (10-25/hpf)  pH: <=5.0 Protein: 3+ mg/dL Cystals: NS (Not Seen)  Glucose: Neg mg/dL Urobilinogen: 0.2 mg/dL Casts: NS (Not Seen)    Nitrites: Neg Trichomonas: Not Present    Leukocyte Esterase: 1+ leu/uL Mucous: Not Present      Epithelial Cells: 0 - 5/hpf       Yeast: NS (Not Seen)      Sperm: Not Present    ASSESSMENT:      ICD-10 Details  1 GU:   Urethral Stricture, 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 Abdomen/Pelvis Without Contrast  Return Visit/Planned Activity: 3 Weeks - C.T. Abdomen/Pelvis  Return Visit/Planned Activity: 1 Month - Follow up MD             Note: A few days after CT          Document Letter(s):  Created for Patient: Clinical Summary         Notes:   Plan to obtain CT without contrast of the abdomen and pelvis. I discussed the possibility of dialysis should he need a nephrectomy. He is relatively against nephrectomy if it gives him the possibility of needing dialysis. However, he will discuss this  with his wife.   Cc: Dr. Shelia Media  Dr. Marval Regal    Signed by Link Snuffer, III, M.D. on 02/06/19 at 4:17 PM (EST)       APPENDED NOTES:  Had an extensive discussion with the patient as well as his son. I reviewed the CT scan with him. He has a large right renal mass that is not amenable to partial nephrectomy. He will require a radical nephrectomy.   I discussed the option of renal mass biopsy. I discussed the CT scan was without contrast only and therefore was not completely characterized. However, by the looks of the CT scan is concerning for malignancy. That being said, in the absence of contrast, we are not able to tell enhancement. I offered renal mass biopsy but he declined. He does not want to proceed with that and instead wants to just proceed with a nephrectomy. He he has a clear understanding that the mass is possibly benign though I think most likely this will turn out to be a primary renal cancer. He understands the potential for bleeding possibly requiring blood transfusion, infection, injury to surrounding structures, need for additional procedures, possibility  of cardiac event and death.   Patient wishes to proceed with hand assist laparoscopic radical nephrectomy. I will obtain a chest x-ray. He will need cardiac clearance. The patient discussed his renal function with his nephrologist last night and this helped him come to the conclusion that he would like to proceed with a nephrectomy.    Signed by Link Snuffer, III, M.D. on 03/07/19 at 12:42 PM (EST

## 2019-04-01 ENCOUNTER — Other Ambulatory Visit: Payer: Self-pay

## 2019-04-01 LAB — URINALYSIS, ROUTINE W REFLEX MICROSCOPIC
Bilirubin Urine: NEGATIVE
Glucose, UA: NEGATIVE mg/dL
Ketones, ur: NEGATIVE mg/dL
Leukocytes,Ua: NEGATIVE
Nitrite: NEGATIVE
Protein, ur: 30 mg/dL — AB
Specific Gravity, Urine: 1.004 — ABNORMAL LOW (ref 1.005–1.030)
pH: 6 (ref 5.0–8.0)

## 2019-04-01 LAB — BASIC METABOLIC PANEL
Anion gap: 9 (ref 5–15)
BUN: 32 mg/dL — ABNORMAL HIGH (ref 8–23)
CO2: 20 mmol/L — ABNORMAL LOW (ref 22–32)
Calcium: 8.1 mg/dL — ABNORMAL LOW (ref 8.9–10.3)
Chloride: 107 mmol/L (ref 98–111)
Creatinine, Ser: 2.54 mg/dL — ABNORMAL HIGH (ref 0.61–1.24)
GFR calc Af Amer: 27 mL/min — ABNORMAL LOW (ref >60)
GFR calc non Af Amer: 23 mL/min — ABNORMAL LOW (ref >60)
Glucose, Bld: 121 mg/dL — ABNORMAL HIGH (ref 70–99)
Potassium: 4.4 mmol/L (ref 3.5–5.1)
Sodium: 136 mmol/L (ref 135–145)

## 2019-04-01 LAB — NA AND K (SODIUM & POTASSIUM), RAND UR
Potassium Urine: 12 mmol/L
Sodium, Ur: 36 mmol/L

## 2019-04-01 LAB — HEMOGLOBIN AND HEMATOCRIT, BLOOD
HCT: 31.5 % — ABNORMAL LOW (ref 39.0–52.0)
Hemoglobin: 9.7 g/dL — ABNORMAL LOW (ref 13.0–17.0)

## 2019-04-01 MED ORDER — AMLODIPINE BESYLATE 10 MG PO TABS
10.0000 mg | ORAL_TABLET | Freq: Every day | ORAL | Status: DC
  Administered 2019-04-02 – 2019-04-05 (×4): 10 mg via ORAL
  Filled 2019-04-01 (×4): qty 1

## 2019-04-01 NOTE — Progress Notes (Signed)
Urology Inpatient Progress Report  Renal mass [N28.89]  Procedure(s): RIGHT  HAND ASSIST LAPAROSCOPIC NEPHRECTOMY CYSTOSCOPY FLEXIBLE  1 Day Post-Op   Intv/Subj: No acute events overnight. Patient is without complaint. He is tolerating clear liquids.  Good urine output.  Creatinine is 2.54.  Active Problems:   Renal mass  Current Facility-Administered Medications  Medication Dose Route Frequency Provider Last Rate Last Admin  . acetaminophen (TYLENOL) tablet 650 mg  650 mg Oral Q4H PRN Marton Redwood III, MD      . allopurinol (ZYLOPRIM) tablet 300 mg  300 mg Oral Daily Marton Redwood III, MD      . amLODipine (NORVASC) tablet 7.5 mg  7.5 mg Oral Daily Marton Redwood III, MD      . atorvastatin (LIPITOR) tablet 20 mg  20 mg Oral Daily Marton Redwood III, MD      . diphenhydrAMINE (BENADRYL) injection 12.5 mg  12.5 mg Intravenous Q6H PRN Marton Redwood III, MD       Or  . diphenhydrAMINE (BENADRYL) 12.5 MG/5ML elixir 12.5 mg  12.5 mg Oral Q6H PRN Marton Redwood III, MD      . docusate sodium (COLACE) capsule 100 mg  100 mg Oral BID Marton Redwood III, MD   100 mg at 03/31/19 2103  . fenofibrate tablet 160 mg  160 mg Oral Daily Marton Redwood III, MD      . furosemide (LASIX) tablet 20 mg  20 mg Oral Daily Marton Redwood III, MD   20 mg at 03/31/19 1817  . losartan (COZAAR) tablet 50 mg  50 mg Oral Daily Marton Redwood III, MD   50 mg at 03/31/19 1818  . metoprolol succinate (TOPROL-XL) 24 hr tablet 25 mg  25 mg Oral Daily Marton Redwood III, MD      . morphine 2 MG/ML injection 2-4 mg  2-4 mg Intravenous Q2H PRN Marton Redwood III, MD      . ondansetron (ZOFRAN) injection 4 mg  4 mg Intravenous Q4H PRN Marton Redwood III, MD      . opium-belladonna (B&O SUPPRETTES) 16.2-60 MG suppository 1 suppository  1 suppository Rectal Q6H PRN Marton Redwood III, MD      . oxybutynin (DITROPAN) tablet 5 mg  5 mg Oral Q8H PRN Marton Redwood III, MD      . oxyCODONE (Oxy IR/ROXICODONE) immediate  release tablet 5 mg  5 mg Oral Q4H PRN Marton Redwood III, MD   5 mg at 04/01/19 0539  . pantoprazole (PROTONIX) EC tablet 40 mg  40 mg Oral Daily Marton Redwood III, MD   40 mg at 03/31/19 1818  . senna (SENOKOT) tablet 8.6 mg  1 tablet Oral BID Marton Redwood III, MD   8.6 mg at 03/31/19 2103  . tamsulosin (FLOMAX) capsule 0.4 mg  0.4 mg Oral Daily Marton Redwood III, MD      . zolpidem (AMBIEN) tablet 5 mg  5 mg Oral QHS PRN Marton Redwood III, MD         Objective: Vital: Vitals:   03/31/19 2121 04/01/19 0019 04/01/19 0518 04/01/19 1035  BP:  137/66 (!) 133/57 (!) 149/68  Pulse: 75 70 70 81  Resp: 18 14 20 19   Temp:  98.6 F (37 C) 98 F (36.7 C) 97.9 F (36.6 C)  TempSrc:   Oral   SpO2: 100% 98% 99% 99%  Weight:  Height:       I/Os: I/O last 3 completed shifts: In: 5625.6 [P.O.:2280; I.V.:3045.6; IV Piggyback:300] Out: 2248 [Urine:935; Blood:100]  Physical Exam:  General: Patient is in no apparent distress Lungs: Normal respiratory effort, chest expands symmetrically. GI: Incisions are c/d/i. The abdomen is soft and nontender without mass. Foley: Suprapubic tube draining clear yellow urine Ext: lower extremities symmetric  Lab Results: Recent Labs    03/31/19 1358 04/01/19 0502  HGB 11.5* 9.7*  HCT 38.5* 31.5*   Recent Labs    04/01/19 0502  NA 136  K 4.4  CL 107  CO2 20*  GLUCOSE 121*  BUN 32*  CREATININE 2.54*  CALCIUM 8.1*   No results for input(s): LABPT, INR in the last 72 hours. No results for input(s): LABURIN in the last 72 hours. Results for orders placed or performed during the hospital encounter of 03/27/19  SARS CORONAVIRUS 2 (TAT 6-24 HRS) Nasopharyngeal Nasopharyngeal Swab     Status: None   Collection Time: 03/27/19  9:28 AM   Specimen: Nasopharyngeal Swab  Result Value Ref Range Status   SARS Coronavirus 2 NEGATIVE NEGATIVE Final    Comment: (NOTE) SARS-CoV-2 target nucleic acids are NOT DETECTED. The SARS-CoV-2 RNA is  generally detectable in upper and lower respiratory specimens during the acute phase of infection. Negative results do not preclude SARS-CoV-2 infection, do not rule out co-infections with other pathogens, and should not be used as the sole basis for treatment or other patient management decisions. Negative results must be combined with clinical observations, patient history, and epidemiological information. The expected result is Negative. Fact Sheet for Patients: SugarRoll.be Fact Sheet for Healthcare Providers: https://www.woods-mathews.com/ This test is not yet approved or cleared by the [place] FDA and  has been authorized for detection and/or diagnosis of SARS-CoV-2 by FDA under an Emergency Use Authorization (EUA). This EUA will remain  in effect (meaning this test can be used) for the duration of the COVID-19 declaration under Section 56 4(b)(1) of the Act, 21 U.S.C. section 360bbb-3(b)(1), unless the authorization is terminated or revoked sooner. Performed at [facility], [place] [address] [place], [place] [address]     Studies/Results: No results found.  Assessment: Right renal mass Urethral stricture Procedure(s): RIGHT  HAND ASSIST LAPAROSCOPIC NEPHRECTOMY CYSTOSCOPY FLEXIBLE, 1 Day Post-Op  doing well.  Plan: Discontinue IV fluids Advance to regular diet Keep suprapubic tube in place Hold DVT prophylaxis given hemoglobin drop to 9.7 Recheck hemoglobin and BMP tomorrow SCDs, incentive spirometry, ambulate   Link Snuffer, MD Urology 04/01/2019, 10:48 AM

## 2019-04-01 NOTE — Consult Note (Signed)
[facility] Nephrology Consultation Note  Requesting MD: Dr. Link Snuffer Reason for consult: AKI on CKD  HPI:  Paul Price is a 79 y.o. male with history of hypertension, HLD, obesity, OSA on CPAP, A. fib, CHF EF 40 to 45% who is now status post right nephrectomy, seen as a consultation at the request of Dr. Gloriann Loan for AKI on CKD.  Patient has baseline serum creatinine level around 2 and has been following with Dr. Marval Regal at [facility].  He had kidney ultrasound done on 12/09/2018 which showed complex mass on right kidney concerning for neoplasm, renal cell carcinoma.  Patient underwent right hand-assisted laparoscopic radical nephrectomy on 03/31/2019.  The serum creatinine level was 2.01 on admission.  Post-surgery the creatinine level elevated to 2.54 today.  Patient has suprapubic catheter with urine output of 935 cc. He is recovering well.  Denies fever, chills, nausea, vomiting, chest pain, shortness of breath.  No lower extremity edema. Pain is well managed.  Already on regular diet and tolerating well.   He received losartan and Lasix yesterday and today.  No use of NSAIDs or IV contrast.    PMHx:   Past Medical History:  Diagnosis Date  . Arthritis    "shoulders" (09/07/2016)  . Atrial fibrillation ([place])   . BPH (benign prostatic hyperplasia)   . Chronic kidney disease (CKD), stage III (moderate)   . Coronary artery disease   . DDD (degenerative disc disease), cervical   . DVT (deep venous thrombosis) ([place]) 12/2013   LLE  . GERD (gastroesophageal reflux disease)   . Gout   . High cholesterol   . History of blood transfusion 09/06/2016   2 u PRBC  . History of scarlet fever 1940s  . History of stress test 06/2011   No significant ischemia, this is a low risk scan. Clinical correlation recommended Abnormal myocardial perfusion study.  Marland Kitchen Hx of echocardiogram 05/2009   EF 40-45%, he did have mild annular calcification with mild-to-moderate MR and mild TR as well as  aortic valve sclerosis. Estimated RV systolic pressure was 21 mm.  . Hypertension   . Iron deficiency anemia   . Kidney failure   . 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 Neck pain    "not chronic" (09/07/2016)  . Obesity   . OSA on CPAP   . RBBB   . Renal insufficiency   . Right renal mass   . Spinal stenosis of lumbar region     Past Surgical History:  Procedure Laterality Date  . APPENDECTOMY    . CARDIAC CATHETERIZATION  2009   he was found to have a atretic LIMA graft to his LAD and had a patent vein graft supplying his diagonal vessel. At that time, he had 70% narrowing in his LAD beyond a diagonal vessel which was initially treated medically.  . COLONOSCOPY WITH PROPOFOL Left 09/09/2016   Procedure: COLONOSCOPY WITH PROPOFOL;  Surgeon: Arta Silence, MD;  Location: [facility];  Service: Endoscopy;  Laterality: Left;  . CORONARY ANGIOPLASTY WITH STENT PLACEMENT  April 2012   LAD DES  . CORONARY ARTERY BYPASS GRAFT  1992   with LIMA to the LAD and diagonal.  . CYSTOSCOPY N/A 03/31/2019   Procedure: CYSTOSCOPY FLEXIBLE;  Surgeon: Lucas Mallow, MD;  Location: [facility];  Service: Urology;  Laterality: N/A;  . ESOPHAGOGASTRODUODENOSCOPY (EGD) WITH PROPOFOL Left 09/08/2016   Procedure: ESOPHAGOGASTRODUODENOSCOPY (EGD) WITH PROPOFOL;  Surgeon: Ronnette Juniper, MD;  Location: [facility];  Service: Gastroenterology;  Laterality: Left;  . INGUINAL HERNIA REPAIR     "don't remeimber which side"  . LAPAROSCOPIC NEPHRECTOMY Right 03/31/2019   Procedure: RIGHT  HAND ASSIST LAPAROSCOPIC NEPHRECTOMY;  Surgeon: Lucas Mallow, MD;  Location: [facility];  Service: Urology;  Laterality: Right;  . PACEMAKER IMPLANT N/A 12/25/2016   Procedure: PACEMAKER IMPLANT;  Surgeon: Deboraha Sprang, MD;  Location: [facility];  Service: Cardiovascular;  Laterality: N/A;  . TONSILLECTOMY      Family Hx:  Family History  Problem Relation Age of Onset  . Heart disease Father     Social  History:  reports that he has quit smoking. His smoking use included cigarettes. He has a 30.00 pack-year smoking history. He has never used smokeless tobacco. He reports current alcohol use. He reports that he does not use drugs.  Allergies:  Allergies  Allergen Reactions  . Nitrostat [Nitroglycerin] Other (See Comments)    Causes blood pressure to "bottom out"    Medications: Prior to Admission medications   Medication Sig Start Date End Date Taking? Authorizing Provider  acetaminophen (TYLENOL) 500 MG tablet Take 1,000 mg by mouth every 6 (six) hours as needed for moderate pain.    Yes [provider]  allopurinol (ZYLOPRIM) 300 MG tablet Take 300 mg by mouth daily.   Yes [provider]  amLODipine (NORVASC) 5 MG tablet Take 1.5 tablets (7.5 mg total) by mouth daily. 01/13/19 04/13/19 Yes Troy Sine, MD  atorvastatin (LIPITOR) 20 MG tablet TAKE 1 TABLET BY MOUTH DAILY Patient taking differently: Take 20 mg by mouth daily.  12/10/18  Yes Troy Sine, MD  b complex vitamins tablet Take 1 tablet by mouth daily.   Yes [provider]  fenofibrate micronized (LOFIBRA) 134 MG capsule Take 134 mg by mouth daily before breakfast.   Yes [provider]  ferrous sulfate 325 (65 FE) MG tablet Take 325 mg by mouth daily with breakfast.   Yes [provider]  furosemide (LASIX) 40 MG tablet Take 0.5 tablets (20 mg total) by mouth daily. 12/26/16  Yes Barton Dubois, MD  losartan (COZAAR) 50 MG tablet Take 50 mg by mouth daily.  02/21/17  Yes [provider]  metoprolol succinate (TOPROL-XL) 25 MG 24 hr tablet TAKE 1 TABLET BY MOUTH DAILY Patient taking differently: Take 25 mg by mouth daily.  12/25/18  Yes Troy Sine, MD  omeprazole (PRILOSEC) 20 MG capsule Take 20 mg by mouth at bedtime.    Yes [provider]  Rivaroxaban (XARELTO) 15 MG TABS tablet Take 1 tablet (15 mg total) by mouth daily with supper. 09/10/16  Yes Velvet Bathe, MD  Tamsulosin HCl (FLOMAX) 0.4 MG CAPS Take 1 capsule (0.4 mg total) by mouth daily. 07/20/11  Yes Chauncy Passy, MD    I have reviewed the patient's current medications.  Labs:  Results for orders placed or performed during the hospital encounter of 03/31/19 (from the past 48 hour(s))  Hemoglobin and hematocrit, blood     Status: Abnormal   Collection Time: 03/31/19  1:58 PM  Result Value Ref Range   Hemoglobin 11.5 (L) 13.0 - 17.0 g/dL   HCT 38.5 (L) 39.0 - 52.0 %    Comment: Performed at [facility], [place] [address] [place], [place] [address]  Basic metabolic panel     Status: Abnormal   Collection Time: 04/01/19  5:02 AM  Result Value Ref Range   Sodium  136 135 - 145 mmol/L   Potassium 4.4 3.5 - 5.1 mmol/L   Chloride 107 98 - 111 mmol/L   CO2 20 (L) 22 - 32 mmol/L   Glucose, Bld 121 (H) 70 - 99 mg/dL    Comment: Glucose reference range applies only to samples taken after fasting for at least 8 hours.   BUN 32 (H) 8 - 23 mg/dL   Creatinine, Ser 2.54 (H) 0.61 - 1.24 mg/dL   Calcium 8.1 (L) 8.9 - 10.3 mg/dL   GFR calc non Af Amer 23 (L) >60 mL/min   GFR calc Af Amer 27 (L) >60 mL/min   Anion gap 9 5 - 15    Comment: Performed at [facility], [place] [address], [place], [place] [address]  Hemoglobin and hematocrit, blood     Status: Abnormal   Collection Time: 04/01/19  5:02 AM  Result Value Ref Range   Hemoglobin 9.7 (L) 13.0 - 17.0 g/dL   HCT 31.5 (L) 39.0 - 52.0 %    Comment: Performed at [facility], [place] [address], [place], [place] [address]     ROS:  Pertinent items noted in HPI and remainder of comprehensive ROS otherwise negative.  Physical Exam: Vitals:   04/01/19 0518 04/01/19 1035  BP: (!) 133/57 (!) 149/68  Pulse: 70 81  Resp: 20 19  Temp: 98 F (36.7 C) 97.9 F (36.6 C)  SpO2: 99% 99%     General exam: Appears calm and comfortable  Respiratory system: Clear to auscultation. Respiratory  effort normal. No wheezing or crackle Cardiovascular system: S1 & S2 heard, RRR.  No pedal edema. Gastrointestinal system: Abdomen is nondistended, soft and nontender. Central nervous system: Alert and oriented. No focal neurological deficits. Extremities: Symmetric 5 x 5 power. Skin: No rashes, lesions or ulcers Psychiatry: Judgement and insight appear normal. Mood & affect appropriate. GU:   Suprapubic catheter with clear urine in the bag.  Assessment/Plan:  #Acute kidney injury on CKD stage III due to reduced renal mass post R nephrectomy: Patient is nonoliguric.  Elevated creatinine level of 2.54 today from baseline around 2.  Holding both Lasix and losartan until renal function is stabilized.  He however got his doses today morning.  Euvolemic on exam.  Encourage oral hydration, no IV fluids because of history of CHF. Check UA and urine lites. Monitor BMP, avoid hypotension or nephrotoxins.  Expect his GFR would be lower after nephrectomy.  I have discussed this with the patient.  #Hypertension: Increase amlodipine to 10 mg.  Holding losartan and Lasix further (got this morning).  Euvolemic on exam.  May be able to resume low-dose Lasix on discharge.  #Right renal mass, urethral stricture: Status post right radical nephrectomy by urologist on 03/31/2019.  Currently has suprapubic catheter.  Per urologist.  Golden Pop likely due to blood loss during surgery: Monitor hemoglobin.  Thank you for the consult.  We will follow with you.  Paul Price 04/01/2019, 1:48 PM  [employer].

## 2019-04-01 NOTE — Plan of Care (Signed)
  Problem: Education: Goal: Knowledge of General Education information will improve Description: Including pain rating scale, medication(s)/side effects and non-pharmacologic comfort measures Outcome: Completed/Met   Problem: Health Behavior/Discharge Planning: Goal: Ability to manage health-related needs will improve Outcome: Progressing   Problem: Clinical Measurements: Goal: Ability to maintain clinical measurements within normal limits will improve Outcome: Progressing Goal: Will remain free from infection Outcome: Progressing Goal: Diagnostic test results will improve Outcome: Progressing Goal: Respiratory complications will improve Outcome: Progressing Goal: Cardiovascular complication will be avoided Outcome: Progressing   Problem: Activity: Goal: Risk for activity intolerance will decrease Outcome: Progressing   Problem: Nutrition: Goal: Adequate nutrition will be maintained Outcome: Progressing   Problem: Coping: Goal: Level of anxiety will decrease Outcome: Completed/Met   Problem: Elimination: Goal: Will not experience complications related to urinary retention Outcome: Progressing   Problem: Pain Managment: Goal: General experience of comfort will improve Outcome: Progressing   Problem: Safety: Goal: Ability to remain free from injury will improve Outcome: Progressing   Problem: Skin Integrity: Goal: Risk for impaired skin integrity will decrease Outcome: Progressing   Problem: Education: Goal: Knowledge of the prescribed therapeutic regimen will improve Outcome: Progressing   Problem: Bowel/Gastric: Goal: Gastrointestinal status for postoperative course will improve Outcome: Progressing   Problem: Clinical Measurements: Goal: Postoperative complications will be avoided or minimized Outcome: Progressing   Problem: Respiratory: Goal: Ability to achieve and maintain a regular respiratory rate will improve Outcome: Progressing   Problem: Skin  Integrity: Goal: Demonstration of wound healing without infection will improve Outcome: Progressing   Problem: Urinary Elimination: Goal: Ability to avoid or minimize complications of infection will improve Outcome: Progressing Goal: Ability to achieve and maintain urine output will improve Outcome: Progressing

## 2019-04-02 LAB — SURGICAL PATHOLOGY

## 2019-04-02 LAB — HEMOGLOBIN AND HEMATOCRIT, BLOOD
HCT: 33.3 % — ABNORMAL LOW (ref 39.0–52.0)
Hemoglobin: 10.4 g/dL — ABNORMAL LOW (ref 13.0–17.0)

## 2019-04-02 LAB — BASIC METABOLIC PANEL
Anion gap: 8 (ref 5–15)
BUN: 42 mg/dL — ABNORMAL HIGH (ref 8–23)
CO2: 22 mmol/L (ref 22–32)
Calcium: 8.4 mg/dL — ABNORMAL LOW (ref 8.9–10.3)
Chloride: 106 mmol/L (ref 98–111)
Creatinine, Ser: 3.47 mg/dL — ABNORMAL HIGH (ref 0.61–1.24)
GFR calc Af Amer: 18 mL/min — ABNORMAL LOW (ref >60)
GFR calc non Af Amer: 16 mL/min — ABNORMAL LOW (ref >60)
Glucose, Bld: 102 mg/dL — ABNORMAL HIGH (ref 70–99)
Potassium: 5 mmol/L (ref 3.5–5.1)
Sodium: 136 mmol/L (ref 135–145)

## 2019-04-02 MED ORDER — HEPARIN SODIUM (PORCINE) 5000 UNIT/ML IJ SOLN
5000.0000 [IU] | Freq: Three times a day (TID) | INTRAMUSCULAR | Status: DC
  Administered 2019-04-02 – 2019-04-05 (×9): 5000 [IU] via SUBCUTANEOUS
  Filled 2019-04-02 (×9): qty 1

## 2019-04-02 MED ORDER — SODIUM CHLORIDE 0.9 % IV SOLN: INTRAVENOUS | Status: DC

## 2019-04-02 MED ORDER — SODIUM CHLORIDE 0.9 % IV SOLN: INTRAVENOUS | Status: AC

## 2019-04-02 NOTE — Progress Notes (Signed)
Urology Inpatient Progress Report  Renal mass [N28.89]  Procedure(s): RIGHT  HAND ASSIST LAPAROSCOPIC NEPHRECTOMY CYSTOSCOPY FLEXIBLE  2 Days Post-Op   Intv/Subj: No acute events overnight. Patient is without complaint. Patient is tolerating his diet. He was seen by nephrology yesterday, Dr. Carolin Sicks. Losartan and Lasix was stopped. Amlodipine was increased. IV fluids were initially stopped but his creatinine is now up to 3 times. Fluids were restarted this morning.Paul Price He has good urine output.  Active Problems:   Renal mass  Current Facility-Administered Medications  Medication Dose Route Frequency Provider Last Rate Last Admin  . 0.9 %  sodium chloride infusion   Intravenous Continuous Rosita Fire, MD 100 mL/hr at 04/02/19 0900 New Bag at 04/02/19 0900  . acetaminophen (TYLENOL) tablet 650 mg  650 mg Oral Q4H PRN Marton Redwood III, MD      . allopurinol (ZYLOPRIM) tablet 300 mg  300 mg Oral Daily Marton Redwood III, MD   300 mg at 04/02/19 1027  . amLODipine (NORVASC) tablet 10 mg  10 mg Oral Daily Rosita Fire, MD   10 mg at 04/02/19 1026  . atorvastatin (LIPITOR) tablet 20 mg  20 mg Oral Daily Marton Redwood III, MD   20 mg at 04/02/19 1027  . diphenhydrAMINE (BENADRYL) injection 12.5 mg  12.5 mg Intravenous Q6H PRN Marton Redwood III, MD       Or  . diphenhydrAMINE (BENADRYL) 12.5 MG/5ML elixir 12.5 mg  12.5 mg Oral Q6H PRN Marton Redwood III, MD      . docusate sodium (COLACE) capsule 100 mg  100 mg Oral BID Marton Redwood III, MD   100 mg at 04/02/19 1026  . fenofibrate tablet 160 mg  160 mg Oral Daily Marton Redwood III, MD   160 mg at 04/02/19 1027  . heparin injection 5,000 Units  5,000 Units Subcutaneous Q8H Marton Redwood III, MD      . metoprolol succinate (TOPROL-XL) 24 hr tablet 25 mg  25 mg Oral Daily Marton Redwood III, MD   25 mg at 04/02/19 1026  . morphine 2 MG/ML injection 2-4 mg  2-4 mg Intravenous Q2H PRN Marton Redwood III, MD      .  ondansetron (ZOFRAN) injection 4 mg  4 mg Intravenous Q4H PRN Marton Redwood III, MD      . opium-belladonna (B&O SUPPRETTES) 16.2-60 MG suppository 1 suppository  1 suppository Rectal Q6H PRN Marton Redwood III, MD      . oxybutynin (DITROPAN) tablet 5 mg  5 mg Oral Q8H PRN Marton Redwood III, MD      . oxyCODONE (Oxy IR/ROXICODONE) immediate release tablet 5 mg  5 mg Oral Q4H PRN Marton Redwood III, MD   5 mg at 04/02/19 0026  . pantoprazole (PROTONIX) EC tablet 40 mg  40 mg Oral Daily Marton Redwood III, MD   40 mg at 04/02/19 1027  . senna (SENOKOT) tablet 8.6 mg  1 tablet Oral BID Marton Redwood III, MD   8.6 mg at 04/02/19 1026  . tamsulosin (FLOMAX) capsule 0.4 mg  0.4 mg Oral Daily Marton Redwood III, MD   0.4 mg at 04/02/19 1026  . zolpidem (AMBIEN) tablet 5 mg  5 mg Oral QHS PRN Marton Redwood III, MD         Objective: Vital: Vitals:   04/01/19 1759 04/01/19 2104 04/01/19 2142 04/02/19 0506  BP: 134/64 130/67  135/70  Pulse: 70 68 64 69  Resp: 18 18 15 18   Temp: 98 F (36.7 C) 97.9 F (36.6 C)  98 F (36.7 C)  TempSrc: Oral Oral  Oral  SpO2: 98% 97% 98% 97%  Weight:      Height:       I/Os: I/O last 3 completed shifts: In: 7275.6 [P.O.:5240; I.V.:1835.6; IV Piggyback:200] Out: 2878 [Urine:3720]  Physical Exam:  General: Patient is in no apparent distress Lungs: Normal respiratory effort, chest expands symmetrically. GI: Incisions are c/d/i. The abdomen is soft and nontender without mass. Foley: Suprapubic tube draining clear yellow urine Ext: lower extremities symmetric  Lab Results: Recent Labs    03/31/19 1358 04/01/19 0502 04/02/19 0515  HGB 11.5* 9.7* 10.4*  HCT 38.5* 31.5* 33.3*   Recent Labs    04/01/19 0502 04/02/19 0515  NA 136 136  K 4.4 5.0  CL 107 106  CO2 20* 22  GLUCOSE 121* 102*  BUN 32* 42*  CREATININE 2.54* 3.47*  CALCIUM 8.1* 8.4*   No results for input(s): LABPT, INR in the last 72 hours. No results for input(s): LABURIN in  the last 72 hours. Results for orders placed or performed during the hospital encounter of 03/27/19  SARS CORONAVIRUS 2 (TAT 6-24 HRS) Nasopharyngeal Nasopharyngeal Swab     Status: None   Collection Time: 03/27/19  9:28 AM   Specimen: Nasopharyngeal Swab  Result Value Ref Range Status   SARS Coronavirus 2 NEGATIVE NEGATIVE Final    Comment: (NOTE) SARS-CoV-2 target nucleic acids are NOT DETECTED. The SARS-CoV-2 RNA is generally detectable in upper and lower respiratory specimens during the acute phase of infection. Negative results do not preclude SARS-CoV-2 infection, do not rule out co-infections with other pathogens, and should not be used as the sole basis for treatment or other patient management decisions. Negative results must be combined with clinical observations, patient history, and epidemiological information. The expected result is Negative. Fact Sheet for Patients: SugarRoll.be Fact Sheet for Healthcare Providers: https://www.woods-mathews.com/ This test is not yet approved or cleared by the [place] FDA and  has been authorized for detection and/or diagnosis of SARS-CoV-2 by FDA under an Emergency Use Authorization (EUA). This EUA will remain  in effect (meaning this test can be used) for the duration of the COVID-19 declaration under Section 56 4(b)(1) of the Act, 21 U.S.C. section 360bbb-3(b)(1), unless the authorization is terminated or revoked sooner. Performed at [facility], [place] [address], [place], [place] [address]     Studies/Results: No results found.  Assessment: Right renal mass Acute renal insufficiency on top of CKD stage III  Procedure(s): RIGHT  HAND ASSIST LAPAROSCOPIC NEPHRECTOMY CYSTOSCOPY FLEXIBLE, 2 Days Post-Op  doing well.  Plan: Continue IV fluids per nephrology. Appreciate assistance. Continue to monitor BMP. Continue suprapubic tube Start heparin DVT prophylaxis, SCDs,  ambulate, incentive spirometry    Link Snuffer, MD Urology 04/02/2019, 11:18 AM

## 2019-04-02 NOTE — Progress Notes (Addendum)
Paul Price  Assessment/ Plan: Pt is a 79 y.o. yo male  with history of HTN, HLD, obesity, OSA on CPAP, A. fib, CHF EF 40 to 45% who is now s/p right nephrectomy, seen as a consultation for AKI on CKD.  #Acute kidney injury on CKD stage III due to reduced renal mass after R nephrectomy: Good urine output and urinalysis was unremarkable except mild proteinuria.  Elevated creatinine level of 3.47 today.  Discontinued both losartan and Lasix yesterday.  Started gentle IV hydration today.  Monitor BMP, avoid hypotension or nephrotoxins.  I expect the creatinine level will plateau in next 1 to 2 days.  I have explained to the patient and family members that his renal function decreases after removal of one kidney.   -hold allopurinol during AKI.  #Hypertension: Blood pressure acceptable.  I have increased amlodipine to 10 mg.  Continue to hold losartan and Lasix.  Euvolemic on exam.  May be able to resume low-dose Lasix on discharge.  #Right renal mass, urethral stricture: Status post right radical nephrectomy by urologist on 03/31/2019.  Currently has suprapubic catheter.  Per urologist.  Golden Pop likely due to blood loss during surgery: Monitor hemoglobin.  Subjective: Seen and examined at bedside.  Urine output of around 3000 cc.  Denied fever, chills, nausea, vomiting, chest pain, shortness of breath.  Patient's wife and son at bedside. Objective Vital signs in last 24 hours: Vitals:   04/01/19 2104 04/01/19 2142 04/02/19 0506 04/02/19 1238  BP: 130/67  135/70 (!) 142/58  Pulse: 68 64 69 92  Resp: 18 15 18 16   Temp: 97.9 F (36.6 C)  98 F (36.7 C) 99.7 F (37.6 C)  TempSrc: Oral  Oral Oral  SpO2: 97% 98% 97% 92%  Weight:      Height:       Weight change:   Intake/Output Summary (Last 24 hours) at 04/02/2019 1358 Last data filed at 04/02/2019 1247 Gross per 24 hour  Intake 3080 ml  Output 3395 ml  Net -315 ml       Labs: Basic  Metabolic Panel: Recent Labs  Lab 04/01/19 0502 04/02/19 0515  NA 136 136  K 4.4 5.0  CL 107 106  CO2 20* 22  GLUCOSE 121* 102*  BUN 32* 42*  CREATININE 2.54* 3.47*  CALCIUM 8.1* 8.4*   Liver Function Tests: No results for input(s): AST, ALT, ALKPHOS, BILITOT, PROT, ALBUMIN in the last 168 hours. No results for input(s): LIPASE, AMYLASE in the last 168 hours. No results for input(s): AMMONIA in the last 168 hours. CBC: Recent Labs  Lab 03/31/19 1358 04/01/19 0502 04/02/19 0515  HGB 11.5* 9.7* 10.4*  HCT 38.5* 31.5* 33.3*   Cardiac Enzymes: No results for input(s): CKTOTAL, CKMB, CKMBINDEX, TROPONINI in the last 168 hours. CBG: No results for input(s): GLUCAP in the last 168 hours.  Iron Studies: No results for input(s): IRON, TIBC, TRANSFERRIN, FERRITIN in the last 72 hours. Studies/Results: No results found.  Medications: Infusions: . sodium chloride 100 mL/hr at 04/02/19 0900    Scheduled Medications: . allopurinol  300 mg Oral Daily  . amLODipine  10 mg Oral Daily  . atorvastatin  20 mg Oral Daily  . docusate sodium  100 mg Oral BID  . fenofibrate  160 mg Oral Daily  . heparin  5,000 Units Subcutaneous Q8H  . metoprolol succinate  25 mg Oral Daily  . pantoprazole  40 mg Oral Daily  . senna  1 tablet  Oral BID  . tamsulosin  0.4 mg Oral Daily    have reviewed scheduled and prn medications.  Physical Exam: General:NAD, comfortable Heart:RRR, s1s2 nl Lungs:clear b/l, no crackle Abdomen:soft, Non-tender, non-distended Extremities:No LE edema GU: Suprapubic catheter, clear urine in the bag  Paul Price 04/02/2019,1:58 PM  LOS: 2 days  Pager: [phone]

## 2019-04-03 ENCOUNTER — Inpatient Hospital Stay (HOSPITAL_COMMUNITY): Payer: Medicare Other

## 2019-04-03 LAB — BASIC METABOLIC PANEL
Anion gap: 7 (ref 5–15)
BUN: 45 mg/dL — ABNORMAL HIGH (ref 8–23)
CO2: 21 mmol/L — ABNORMAL LOW (ref 22–32)
Calcium: 8 mg/dL — ABNORMAL LOW (ref 8.9–10.3)
Chloride: 107 mmol/L (ref 98–111)
Creatinine, Ser: 3.48 mg/dL — ABNORMAL HIGH (ref 0.61–1.24)
GFR calc Af Amer: 18 mL/min — ABNORMAL LOW (ref >60)
GFR calc non Af Amer: 16 mL/min — ABNORMAL LOW (ref >60)
Glucose, Bld: 112 mg/dL — ABNORMAL HIGH (ref 70–99)
Potassium: 4.1 mmol/L (ref 3.5–5.1)
Sodium: 135 mmol/L (ref 135–145)

## 2019-04-03 LAB — GLUCOSE, CAPILLARY: Glucose-Capillary: 98 mg/dL (ref 70–99)

## 2019-04-03 NOTE — Progress Notes (Signed)
RN attempted to walk patient. Pt had much difficulty sitting at EOB due to weakness. Assisted to standing position. Pt states "I'm dizzy". Noted patient has hx of vertigo. After standing at the side of bed for 3 minutes, pt assisted back to sitting position. VSS. CBG 98. Pt assisted back to lying position where he states "I feel better now". Pt's appetite has been noted to be decreased for the last two days. Pt encouraged and made aware of the importance of nutrition in his recovery. Verbalized understanding. Dr. Gloriann Loan paged and made aware. Family and patient updated at bedside. Will continue to monitor.

## 2019-04-03 NOTE — Progress Notes (Signed)
Patient ID: Paul Price, male   DOB: May 04, 1940, 79 y.o.   MRN: [id number] S: Feels a little better but has some nausea and dizziness upon standing O:BP (!) 146/62   Pulse 83   Temp 98.5 F (36.9 C) (Oral)   Resp (!) 22   Ht 5\' 5"  (1.651 m)   Wt 88.3 kg   SpO2 97%   BMI 32.38 kg/m   Intake/Output Summary (Last 24 hours) at 04/03/2019 1430 Last data filed at 04/03/2019 1000 Gross per 24 hour  Intake 3229.49 ml  Output 1575 ml  Net 1654.49 ml   Intake/Output: I/O last 3 completed shifts: In: 3360.1 [P.O.:1560; I.V.:1800.1] Out: 4045 [Urine:4045]  Intake/Output this shift:  Total I/O In: 1069.4 [I.V.:1069.4] Out: -  Weight change:  Gen: NAD CVS: no rub Resp:cta Abd: +BS, soft, Mildly tender Ext: trace pretibial edema.   Recent Labs  Lab 04/01/19 0502 04/02/19 0515 04/03/19 0507  NA 136 136 135  K 4.4 5.0 4.1  CL 107 106 107  CO2 20* 22 21*  GLUCOSE 121* 102* 112*  BUN 32* 42* 45*  CREATININE 2.54* 3.47* 3.48*  CALCIUM 8.1* 8.4* 8.0*   Liver Function Tests: No results for input(s): AST, ALT, ALKPHOS, BILITOT, PROT, ALBUMIN in the last 168 hours. No results for input(s): LIPASE, AMYLASE in the last 168 hours. No results for input(s): AMMONIA in the last 168 hours. CBC: Recent Labs  Lab 03/31/19 1358 04/01/19 0502 04/02/19 0515  HGB 11.5* 9.7* 10.4*  HCT 38.5* 31.5* 33.3*   Cardiac Enzymes: No results for input(s): CKTOTAL, CKMB, CKMBINDEX, TROPONINI in the last 168 hours. CBG: Recent Labs  Lab 04/03/19 1230  GLUCAP 98    Iron Studies: No results for input(s): IRON, TIBC, TRANSFERRIN, FERRITIN in the last 72 hours. Studies/Results: No results found. Marland Kitchen amLODipine  10 mg Oral Daily  . atorvastatin  20 mg Oral Daily  . docusate sodium  100 mg Oral BID  . fenofibrate  160 mg Oral Daily  . heparin  5,000 Units Subcutaneous Q8H  . metoprolol succinate  25 mg Oral Daily  . pantoprazole  40 mg Oral Daily  . senna  1 tablet Oral BID  . tamsulosin  0.4  mg Oral Daily    BMET    Component Value Date/Time   NA 135 04/03/2019 0507   NA 146 (H) 11/22/2016 1057   K 4.1 04/03/2019 0507   CL 107 04/03/2019 0507   CO2 21 (L) 04/03/2019 0507   GLUCOSE 112 (H) 04/03/2019 0507   BUN 45 (H) 04/03/2019 0507   BUN 44 (H) 11/22/2016 1057   CREATININE 3.48 (H) 04/03/2019 0507   CREATININE 2.06 (H) 01/15/2019 1116   CREATININE 1.79 (H) 10/27/2015 0803   CALCIUM 8.0 (L) 04/03/2019 0507   GFRNONAA 16 (L) 04/03/2019 0507   GFRNONAA 30 (L) 01/15/2019 1116   GFRNONAA 36 (L) 10/27/2015 0803   GFRAA 18 (L) 04/03/2019 0507   GFRAA 34 (L) 01/15/2019 1116   GFRAA 41 (L) 10/27/2015 0803   CBC    Component Value Date/Time   WBC 7.4 03/21/2019 1241   RBC 4.06 (L) 03/21/2019 1241   HGB 10.4 (L) 04/02/2019 0515   HCT 33.3 (L) 04/02/2019 0515   HCT 39.9 10/01/2018 1130   PLT 182 03/21/2019 1241   MCV 96.1 03/21/2019 1241   MCH 28.6 03/21/2019 1241   MCHC 29.7 (L) 03/21/2019 1241   RDW 16.2 (H) 03/21/2019 1241   LYMPHSABS 1.0 01/15/2019 1116  MONOABS 0.5 01/15/2019 1116   EOSABS 0.3 01/15/2019 1116   BASOSABS 0.0 01/15/2019 1116     Assessment/Plan:  1. AKI/CKD stage 3 following right nephrectomy.  Thankfully his BUN/Cr appear to have reached a plateau.  I would hold off discharge for another 24 hours to make sure that it is remaining stable/improving.  No indication for HD at this time and he is set up to see me in the office in 2 weeks.  Will arrange for outpatient labs weekly after discharge. 2. 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   3. HTN- stable and would not resume losartan at discharge. 4. Anemia of ckd with ABLA- stable. 5. Disposition- hopefully he will can be discharged to home tomorrow if his renal function remains stable but he will also benefit from PT/OT eval for [facility] given his unsteadiness and dizziness upon standing.   Donetta Potts, MD [employer] [phone]

## 2019-04-03 NOTE — Progress Notes (Signed)
Fever of 101.2 and O2 saturations 85% on RA. Tylenol given PRN and Dalton applied at 2L. Dr. Gloriann Loan paged and made aware. New orders received and followed. Will continue to monitor.

## 2019-04-03 NOTE — TOC Progression Note (Signed)
Transition of Care [facility]) - Progression Note    Patient Details  Name: Paul Price MRN: [id number] Date of Birth: [birth year]  Transition of Care [facility]) CM/SW Contact  Carlisa Eble, Juliann Pulse, RN Phone Number: 04/03/2019, 5:07 PM  Clinical Narrative:  [facility] to assess if able to accept for HHPT/OT. Await PT eval & recc.          Expected Discharge Plan and Services                                                 Social Determinants of Health (SDOH) Interventions    Readmission Risk Interventions No flowsheet data found.

## 2019-04-03 NOTE — Care Management Important Message (Signed)
Important Message  Patient Details IM Letter given to Dessa Phi RN Case Manager to present to the [name] MRN: [id number] Date of Birth: 06-15-1940   Medicare Important Message Given:  Yes     Kerin Salen 04/03/2019, 12:26 PM

## 2019-04-04 LAB — BASIC METABOLIC PANEL
Anion gap: 10 (ref 5–15)
BUN: 46 mg/dL — ABNORMAL HIGH (ref 8–23)
CO2: 18 mmol/L — ABNORMAL LOW (ref 22–32)
Calcium: 8.2 mg/dL — ABNORMAL LOW (ref 8.9–10.3)
Chloride: 106 mmol/L (ref 98–111)
Creatinine, Ser: 3.45 mg/dL — ABNORMAL HIGH (ref 0.61–1.24)
GFR calc Af Amer: 18 mL/min — ABNORMAL LOW (ref >60)
GFR calc non Af Amer: 16 mL/min — ABNORMAL LOW (ref >60)
Glucose, Bld: 126 mg/dL — ABNORMAL HIGH (ref 70–99)
Potassium: 3.9 mmol/L (ref 3.5–5.1)
Sodium: 134 mmol/L — ABNORMAL LOW (ref 135–145)

## 2019-04-04 LAB — CBC
HCT: 30 % — ABNORMAL LOW (ref 39.0–52.0)
Hemoglobin: 9.6 g/dL — ABNORMAL LOW (ref 13.0–17.0)
MCH: 30.1 pg (ref 26.0–34.0)
MCHC: 32 g/dL (ref 30.0–36.0)
MCV: 94 fL (ref 80.0–100.0)
Platelets: 119 10*3/uL — ABNORMAL LOW (ref 150–400)
RBC: 3.19 MIL/uL — ABNORMAL LOW (ref 4.22–5.81)
RDW: 15.3 % (ref 11.5–15.5)
WBC: 8.5 10*3/uL (ref 4.0–10.5)
nRBC: 0 % (ref 0.0–0.2)

## 2019-04-04 NOTE — Progress Notes (Signed)
Patient ID: Paul Price, male   DOB: 1940/03/02, 79 y.o.   MRN: [id number] S: Stable creatinine, adequate UOP, seen by PT where home health PT recommended, blood pressure stable O:BP (!) 164/71 (BP Location: Right Arm)   Pulse 85   Temp 98.3 F (36.8 C) (Oral)   Resp 16   Ht 5\' 5"  (1.651 m)   Wt 88.3 kg   SpO2 94%   BMI 32.38 kg/m   Intake/Output Summary (Last 24 hours) at 04/04/2019 1352 Last data filed at 04/04/2019 0539 Gross per 24 hour  Intake 620 ml  Output 1700 ml  Net -1080 ml   Intake/Output: I/O last 3 completed shifts: In: 2909.5 [P.O.:1040; I.V.:1869.5] Out: 2675 [Urine:2675]  Intake/Output this shift:  No intake/output data recorded. Weight change:  Gen: NAD CVS: no rub Resp:cta Abd: +BS, soft, Mildly tender Ext: trace pretibial edema.   Recent Labs  Lab 04/01/19 0502 04/02/19 0515 04/03/19 0507 04/04/19 0529  NA 136 136 135 134*  K 4.4 5.0 4.1 3.9  CL 107 106 107 106  CO2 20* 22 21* 18*  GLUCOSE 121* 102* 112* 126*  BUN 32* 42* 45* 46*  CREATININE 2.54* 3.47* 3.48* 3.45*  CALCIUM 8.1* 8.4* 8.0* 8.2*   Liver Function Tests: No results for input(s): AST, ALT, ALKPHOS, BILITOT, PROT, ALBUMIN in the last 168 hours. No results for input(s): LIPASE, AMYLASE in the last 168 hours. No results for input(s): AMMONIA in the last 168 hours. CBC: Recent Labs  Lab 04/01/19 0502 04/02/19 0515 04/04/19 0529  WBC  --   --  8.5  HGB 9.7* 10.4* 9.6*  HCT 31.5* 33.3* 30.0*  MCV  --   --  94.0  PLT  --   --  119*   Cardiac Enzymes: No results for input(s): CKTOTAL, CKMB, CKMBINDEX, TROPONINI in the last 168 hours. CBG: Recent Labs  Lab 04/03/19 1230  GLUCAP 98    Iron Studies: No results for input(s): IRON, TIBC, TRANSFERRIN, FERRITIN in the last 72 hours. Studies/Results: DG CHEST PORT 1 VIEW  Result Date: 04/03/2019 CLINICAL DATA:  Hypoxia, history coronary artery disease, hypertension EXAM: PORTABLE CHEST 1 VIEW COMPARISON:  Portable exam 1838  hours compared to 03/12/2019 FINDINGS: LEFT subclavian transvenous pacemaker leads project at RIGHT atrium and RIGHT ventricle. Atherosclerotic calcification aorta. Enlargement of cardiac silhouette post CABG. Mediastinal contours and pulmonary vascularity normal. Bibasilar atelectasis. Lungs otherwise clear. No infiltrate, pleural effusion or pneumothorax. Bones demineralized with advanced RIGHT glenohumeral degenerative changes. IMPRESSION: Enlargement of cardiac silhouette post CABG and pacemaker. Bibasilar atelectasis. Electronically Signed   By: Lavonia Dana M.D.   On: 04/03/2019 19:06   . amLODipine  10 mg Oral Daily  . atorvastatin  20 mg Oral Daily  . docusate sodium  100 mg Oral BID  . fenofibrate  160 mg Oral Daily  . heparin  5,000 Units Subcutaneous Q8H  . metoprolol succinate  25 mg Oral Daily  . pantoprazole  40 mg Oral Daily  . senna  1 tablet Oral BID  . tamsulosin  0.4 mg Oral Daily    BMET    Component Value Date/Time   NA 134 (L) 04/04/2019 0529   NA 146 (H) 11/22/2016 1057   K 3.9 04/04/2019 0529   CL 106 04/04/2019 0529   CO2 18 (L) 04/04/2019 0529   GLUCOSE 126 (H) 04/04/2019 0529   BUN 46 (H) 04/04/2019 0529   BUN 44 (H) 11/22/2016 1057   CREATININE 3.45 (H) 04/04/2019 6789  CREATININE 2.06 (H) 01/15/2019 1116   CREATININE 1.79 (H) 10/27/2015 0803   CALCIUM 8.2 (L) 04/04/2019 0529   GFRNONAA 16 (L) 04/04/2019 0529   GFRNONAA 30 (L) 01/15/2019 1116   GFRNONAA 36 (L) 10/27/2015 0803   GFRAA 18 (L) 04/04/2019 0529   GFRAA 34 (L) 01/15/2019 1116   GFRAA 41 (L) 10/27/2015 0803   CBC    Component Value Date/Time   WBC 8.5 04/04/2019 0529   RBC 3.19 (L) 04/04/2019 0529   HGB 9.6 (L) 04/04/2019 0529   HCT 30.0 (L) 04/04/2019 0529   HCT 39.9 10/01/2018 1130   PLT 119 (L) 04/04/2019 0529   MCV 94.0 04/04/2019 0529   MCH 30.1 04/04/2019 0529   MCHC 32.0 04/04/2019 0529   RDW 15.3 04/04/2019 0529   LYMPHSABS 1.0 01/15/2019 1116   MONOABS 0.5 01/15/2019 1116    EOSABS 0.3 01/15/2019 1116   BASOSABS 0.0 01/15/2019 1116     Assessment/Plan:  1. AKI/CKD stage 3 following right nephrectomy.  Thankfully his BUN/Cr stabilized.  Okay for discharge and will follow up closely with Dr. Marval Regal. 2. 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   3. HTN- stable and would not resume losartan at discharge.   4. Anemia of ckd with ABLA- stable. 5. OK for DC today from neph perspective.  Outp f/u being arranged.  Rexene Agent

## 2019-04-04 NOTE — TOC Progression Note (Signed)
Transition of Care [facility]) - Progression Note    Patient Details  Name: Paul Price MRN: [id number] Date of Birth: 07/04/1940  Transition of Care [facility]) CM/SW Contact  Purcell Mouton, RN Phone Number: 04/04/2019, 11:37 AM  Clinical Narrative:    Spoke with pt's son at bedside. Alvis Lemmings will follow pt at home for PT only. Waiting for PT to see pt here.         Expected Discharge Plan and Services                                                 Social Determinants of Health (SDOH) Interventions    Readmission Risk Interventions No flowsheet data found.

## 2019-04-04 NOTE — Progress Notes (Signed)
No fever over night, low grade temp this morning. Patient unable to walk but able to get the patient to sit at the EOB this morning, continue encouraging incentive spirometry.

## 2019-04-04 NOTE — Evaluation (Signed)
Physical Therapy Evaluation Patient Details Name: Paul Price MRN: [id number] DOB: Nov 28, 1940 Today's Date: 04/04/2019   History of Present Illness  79 y.o. yo male  with history of HTN, HLD, obesity, OSA on CPAP, A. fib, CHF EF 40 to 45% who is now s/p right nephrectomy. admitted with Acute kidney injury on CKD stage III due to reduced renal mass after R nephrectomy:  Clinical Impression  Pt admitted with above diagnosis.  Pt denies dizziness today on standing/OOB. He is able to amb short distance and does fatigue quickly. SpO2= 94-96% on RA. NT made aware for resting check later; will benefit from HHPT at d/c.   Pt currently with functional limitations due to the deficits listed below (see PT Problem List). Pt will benefit from skilled PT to increase their independence and safety with mobility to allow discharge to the venue listed below.     Follow Up Recommendations Home health PT    Equipment Recommendations  None recommended by PT    Recommendations for Other Services       Precautions / Restrictions Precautions Precautions: Fall Restrictions Weight Bearing Restrictions: No      Mobility  Bed Mobility Overal bed mobility: Needs Assistance Bed Mobility: Supine to Sit     Supine to sit: Min assist     General bed mobility comments: partial roll to right, heavy useof rail and assist from PT to come to sit (pt reports he "pulls up on bedpost" at home  Transfers Overall transfer level: Needs assistance Equipment used: Rolling walker (2 wheeled) Transfers: Sit to/from Stand Sit to Stand: Min assist         General transfer comment: cues for hand placement, to control descent, assist to rise  Ambulation/Gait Ambulation/Gait assistance: Min assist Gait Distance (Feet): 55 Feet Assistive device: Rolling walker (2 wheeled) Gait Pattern/deviations: Step-through pattern;Decreased stride length;Trunk flexed Gait velocity: decr   General Gait Details: cues for  trunk extension, RW position, step length. SpO2= 94 to 96% on RA  Stairs            Wheelchair Mobility    Modified Rankin (Stroke Patients Only)       Balance Overall balance assessment: Needs assistance Sitting-balance support: Bilateral upper extremity supported;Feet supported Sitting balance-[name] Scale: Fair Sitting balance - Comments: Poor to fair, requiring assist initially to maintain midline Postural control: Posterior lean   Standing balance-[name] Scale: Poor Standing balance comment: reliant on UEs                             Pertinent Vitals/Pain Pain Assessment: (later reports some pain with intiation of  movement, subsides)    Home Living Family/patient expects to be discharged to:: Private residence Living Arrangements: Spouse/significant other Available Help at Discharge: Family;Available 24 hours/day Type of Home: House Home Access: Stairs to enter Entrance Stairs-Rails: Right Entrance Stairs-Number of Steps: 5 Home Layout: One level Home Equipment: Walker - 2 wheels;Cane - single point      Prior Function Level of Independence: Independent         Comments: using RW in hospital, reports not using AD prior to admission     Hand Dominance        Extremity/Trunk Assessment   Upper Extremity Assessment Upper Extremity Assessment: Generalized weakness    Lower Extremity Assessment Lower Extremity Assessment: Generalized weakness       Communication   Communication: HOH  Cognition Arousal/Alertness: Awake/alert Behavior During Therapy:  WFL for tasks assessed/performed Overall Cognitive Status: Within Functional Limits for tasks assessed                                        General Comments      Exercises     Assessment/Plan    PT Assessment Patient needs continued PT services  PT Problem List Decreased strength;Decreased range of motion;Decreased activity tolerance;Decreased mobility;Decreased  knowledge of use of DME;Decreased balance       PT Treatment Interventions DME instruction;Therapeutic exercise;Gait training;Functional mobility training;Therapeutic activities;Patient/family education    PT Goals (Current goals can be found in the Care Plan section)  Acute Rehab PT Goals Patient Stated Goal: home soon, get back to working in the yard PT Goal Formulation: With patient Time For Goal Achievement: 04/17/19 Potential to Achieve Goals: Good    Frequency Min 3X/week   Barriers to discharge        Co-evaluation               AM-PAC PT "6 Clicks" Mobility  Outcome Measure Help needed turning from your back to your side while in a flat bed without using bedrails?: A Little Help needed moving from lying on your back to sitting on the side of a flat bed without using bedrails?: A Little Help needed moving to and from a bed to a chair (including a wheelchair)?: A Little Help needed standing up from a chair using your arms (e.g., wheelchair or bedside chair)?: A Little Help needed to walk in hospital room?: A Little Help needed climbing 3-5 steps with a railing? : A Lot 6 Click Score: 17    End of Session Equipment Utilized During Treatment: Gait belt Activity Tolerance: Patient tolerated treatment well Patient left: in chair;with call bell/phone within reach;with family/visitor present Nurse Communication: Mobility status PT Visit Diagnosis: Difficulty in walking, not elsewhere classified (R26.2)    Time: 1610-9604 PT Time Calculation (min) (ACUTE ONLY): 18 min   Charges:   PT Evaluation $PT Eval Low Complexity: [place], PT   Acute Rehab Dept [facility]): [phone]   04/04/2019   [facility] 04/04/2019, 12:18 PM

## 2019-04-05 MED ORDER — OXYCODONE HCL 5 MG PO TABS: 5.0000 mg | ORAL_TABLET | ORAL | 0 refills | Status: DC | PRN

## 2019-04-05 MED ORDER — AMLODIPINE BESYLATE 10 MG PO TABS: 10.0000 mg | ORAL_TABLET | Freq: Every day | ORAL | 3 refills | Status: DC

## 2019-04-05 NOTE — Discharge Instructions (Signed)

## 2019-04-05 NOTE — Discharge Summary (Signed)
Physician Discharge Summary  Patient ID: Paul Price MRN: [id number] DOB/AGE: Dec 21, 1940 79 y.o.  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  During the surgery, he was noted to have a urethral stricture.  Catheter was unable to be placed.  Therefore, suprapubic tube had to be placed.  Patient tolerated procedure well and was stable postoperatively.  Throughout his stay, nephrology followed the patient.  Losartan and Lasix were discontinued and amlodipine was increased to 10 mg.  Patient did have some hypoxia at 1 point but chest x-ray was negative except for some atelectasis.  He recovered from this.  He worked with physical therapy and home health physical therapy was recommended and set up.  His creatinine plateaued at about 3.5.  He had good urine output from the suprapubic catheter.  He was discharged home in stable condition on 04/05/2019.  Consults: Nephrology  Significant Diagnostic Studies: None  Treatments: surgery: As above  Discharge Exam: Blood pressure (!) 160/71, pulse 83, temperature 98.3 F (36.8 C), temperature source Oral, resp. rate 18, height 5\' 5"  (1.651 m), weight 88.3 kg, SpO2 94 %. General appearance: alert no acute distress Adequate perfusion of extremities Nonlabored respiration Abdomen soft, appropriately tender, nondistended, staples intact with no erythema Suprapubic catheter draining clear yellow urine  Disposition: Discharge disposition: 01-Home or Self Care        Allergies as of 04/05/2019      Reactions   Nitrostat [nitroglycerin] Other (See Comments)   Causes blood pressure to "bottom out"      Medication List    STOP taking these medications   furosemide 40 MG  tablet Commonly known as: LASIX   losartan 50 MG tablet Commonly known as: COZAAR     TAKE these medications   acetaminophen 500 MG tablet Commonly known as: TYLENOL Take 1,000 mg by mouth every 6 (six) hours as needed for moderate pain.   allopurinol 300 MG tablet Commonly known as: ZYLOPRIM Take 300 mg by mouth daily.   amLODipine 10 MG tablet Commonly known as: NORVASC Take 1 tablet (10 mg total) by mouth daily. Start taking on: April 06, 2019 What changed:   medication strength  how much to take   atorvastatin 20 MG tablet Commonly known as: LIPITOR TAKE 1 TABLET BY MOUTH DAILY   b complex vitamins tablet Take 1 tablet by mouth daily.   fenofibrate micronized 134 MG capsule Commonly known as: LOFIBRA Take 134 mg by mouth daily before breakfast.   ferrous sulfate 325 (65 FE) MG tablet Take 325 mg by mouth daily with breakfast.   metoprolol succinate 25 MG 24 hr tablet Commonly known as: TOPROL-XL TAKE 1 TABLET BY MOUTH DAILY   omeprazole 20 MG capsule Commonly known as: PRILOSEC Take 20 mg by mouth at bedtime.   oxyCODONE 5 MG immediate release tablet Commonly known as: Oxy IR/ROXICODONE Take 1 tablet (5 mg total) by mouth every 4 (four) hours as needed for moderate pain.   Rivaroxaban 15 MG Tabs tablet Commonly known as: XARELTO Take 1 tablet (15 mg total) by mouth daily with supper.   tamsulosin 0.4 MG Caps capsule Commonly known as: Flomax Take 1 capsule (0.4 mg total) by mouth daily.        Signed: Marton Redwood, III 04/05/2019, 11:32  AM

## 2019-04-05 NOTE — Progress Notes (Signed)
Late entry:  I saw the patient on Thursday morning.  He was doing very well.  Creatinine was stable at 3.5.  I spoke with nephrology who wanted to keep him 1 more day for another day of observation of creatinine.  He was having some nausea and dizziness upon standing.  Therefore, he did not ambulate very much.  We will plan on consulting with physical therapy.

## 2019-04-05 NOTE — Progress Notes (Signed)
Late entry.  I saw the patient twice yesterday.  He was cleared from the standpoint of nephrology to go home.  However, he was still weak and I wanted to keep him 1 day for observation to ensure he could safely go home with home health physical therapy.  Home health physical therapy evaluated him and he was doing well.  Creatinine was stable.

## 2019-04-10 DIAGNOSIS — G4733 Obstructive sleep apnea (adult) (pediatric): Secondary | ICD-10-CM | POA: Diagnosis not present

## 2019-04-16 ENCOUNTER — Emergency Department (HOSPITAL_COMMUNITY): Payer: Medicare Other

## 2019-04-16 ENCOUNTER — Encounter (HOSPITAL_COMMUNITY): Payer: Self-pay | Admitting: Emergency Medicine

## 2019-04-16 ENCOUNTER — Emergency Department (HOSPITAL_COMMUNITY)
Admission: EM | Admit: 2019-04-16 | Discharge: 2019-04-16 | Disposition: A | Discharge status: Home / Self Care | Payer: Medicare Other | Attending: Emergency Medicine | Admitting: Emergency Medicine

## 2019-04-16 ENCOUNTER — Other Ambulatory Visit: Payer: Self-pay

## 2019-04-16 DIAGNOSIS — I5022 Chronic systolic (congestive) heart failure: Secondary | ICD-10-CM | POA: Diagnosis not present

## 2019-04-16 DIAGNOSIS — I130 Hypertensive heart and chronic kidney disease with heart failure and stage 1 through stage 4 chronic kidney disease, or unspecified chronic kidney disease: Secondary | ICD-10-CM | POA: Insufficient documentation

## 2019-04-16 DIAGNOSIS — Y999 Unspecified external cause status: Secondary | ICD-10-CM | POA: Diagnosis not present

## 2019-04-16 DIAGNOSIS — Y9389 Activity, other specified: Secondary | ICD-10-CM | POA: Diagnosis not present

## 2019-04-16 DIAGNOSIS — Y92481 Parking lot as the place of occurrence of the external cause: Secondary | ICD-10-CM | POA: Diagnosis not present

## 2019-04-16 DIAGNOSIS — R519 Headache, unspecified: Secondary | ICD-10-CM | POA: Insufficient documentation

## 2019-04-16 DIAGNOSIS — Z951 Presence of aortocoronary bypass graft: Secondary | ICD-10-CM | POA: Diagnosis not present

## 2019-04-16 DIAGNOSIS — Z950 Presence of cardiac pacemaker: Secondary | ICD-10-CM | POA: Diagnosis not present

## 2019-04-16 DIAGNOSIS — Z79899 Other long term (current) drug therapy: Secondary | ICD-10-CM | POA: Insufficient documentation

## 2019-04-16 DIAGNOSIS — S0990XA Unspecified injury of head, initial encounter: Secondary | ICD-10-CM | POA: Diagnosis not present

## 2019-04-16 DIAGNOSIS — M542 Cervicalgia: Secondary | ICD-10-CM | POA: Diagnosis not present

## 2019-04-16 DIAGNOSIS — I2510 Atherosclerotic heart disease of native coronary artery without angina pectoris: Secondary | ICD-10-CM | POA: Diagnosis not present

## 2019-04-16 DIAGNOSIS — N183 Chronic kidney disease, stage 3 unspecified: Secondary | ICD-10-CM | POA: Insufficient documentation

## 2019-04-16 DIAGNOSIS — Z87891 Personal history of nicotine dependence: Secondary | ICD-10-CM | POA: Diagnosis not present

## 2019-04-16 DIAGNOSIS — W052XXA Fall from non-moving motorized mobility scooter, initial encounter: Secondary | ICD-10-CM | POA: Diagnosis not present

## 2019-04-16 DIAGNOSIS — T148XXA Other injury of unspecified body region, initial encounter: Secondary | ICD-10-CM

## 2019-04-16 DIAGNOSIS — S80811A Abrasion, right lower leg, initial encounter: Secondary | ICD-10-CM | POA: Diagnosis not present

## 2019-04-16 DIAGNOSIS — W19XXXA Unspecified fall, initial encounter: Secondary | ICD-10-CM

## 2019-04-16 DIAGNOSIS — S50311A Abrasion of right elbow, initial encounter: Secondary | ICD-10-CM | POA: Diagnosis not present

## 2019-04-16 DIAGNOSIS — S199XXA Unspecified injury of neck, initial encounter: Secondary | ICD-10-CM | POA: Diagnosis not present

## 2019-04-16 MED ORDER — BACITRACIN ZINC 500 UNIT/GM EX OINT: TOPICAL_OINTMENT | Freq: Once | CUTANEOUS | Status: DC

## 2019-04-16 NOTE — ED Notes (Signed)
Patient transported to CT 

## 2019-04-16 NOTE — ED Provider Notes (Signed)
[facility] DEPT Provider Note   CSN: [id number] Arrival date & time: 04/16/19  1447     History Chief Complaint  Patient presents with  . Fall    Paul Price is a 79 y.o. male.  79 year old male on Xarelto for A. fib, recent nephrectomy presents for evaluation after a fall.  Patient was sitting on his walker with someone pushing the walker for him into the urology office for a follow-up appointment today when the wheels of the walker got caught in a group on the sidewalk causing the walker to fall forward and the patient to fall backwards striking the back of his head on the ground.  Patient reports pain in his neck, has minor abrasion to the right elbow and minor abrasion to the left lower leg.  No active bleeding at this time.  Denies visual disturbance, nausea, vomiting, headache.  No other injuries or concerns today.        Past Medical History:  Diagnosis Date  . Arthritis    "shoulders" (09/07/2016)  . Atrial fibrillation ([place])   . BPH (benign prostatic hyperplasia)   . Chronic kidney disease (CKD), stage III (moderate)   . Coronary artery disease   . DDD (degenerative disc disease), cervical   . DVT (deep venous thrombosis) ([place]) 12/2013   LLE  . GERD (gastroesophageal reflux disease)   . Gout   . High cholesterol   . History of blood transfusion 09/06/2016   2 u PRBC  . History of scarlet fever 1940s  . History of stress test 06/2011   No significant ischemia, this is a low risk scan. Clinical correlation recommended Abnormal myocardial perfusion study.  Marland Kitchen Hx of echocardiogram 05/2009   EF 40-45%, he did have mild annular calcification with mild-to-moderate MR and mild TR as well as aortic valve sclerosis. Estimated RV systolic pressure was 21 mm.  . Hypertension   . Iron deficiency anemia   . Kidney failure   . 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 Neck pain    "not chronic" (09/07/2016)  . Obesity   . OSA on CPAP   .  RBBB   . Renal insufficiency   . Right renal mass   . Spinal stenosis of lumbar region     Patient Active Problem List   Diagnosis Date Noted  . Renal mass 03/31/2019  . Acute on chronic systolic CHF (congestive heart failure) ([place])   . 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 Symptomatic bradycardia 12/21/2016  . Systolic congestive heart failure ([place]) 12/20/2016  . Normocytic anemia 09/06/2016  . GIB (gastrointestinal bleeding) 09/06/2016  . Acute blood loss anemia   . New onset a-fib ([place]) 08/08/2016  . Chronic diastolic (congestive) heart failure ([place]) 08/08/2016  . Hypotension 08/08/2016  . A-fib ([place]) 08/08/2016  . Primary gout   . 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 Syncope 12/02/2013  . Left leg DVT ([place]) 12/02/2013  . Fever 12/02/2013  . AKI (acute kidney injury) ([place]) 12/02/2013  . Renal insufficiency 07/02/2013  . PAF (paroxysmal atrial fibrillation) ([place]) 01/04/2013  . Sinus pause, 4.3 seconds.  Recorded by [facility]. 12/02/2012  . NSVT (nonsustained ventricular tachycardia) ([place]) 12/02/2012  . Dyspnea on exertion 11/20/2012  . 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 HTN (hypertension) 08/20/2012  . Hyperlipidemia with target LDL less than 70 08/20/2012  . Obesity (BMI 30-39.9) 08/20/2012  . OSA on CPAP 08/20/2012  .  RBBB 08/20/2012    Past Surgical History:  Procedure Laterality Date  . APPENDECTOMY    . CARDIAC CATHETERIZATION  2009   he was found to have a atretic LIMA graft to his LAD and had a patent vein graft supplying his diagonal vessel. At that time, he had 70% narrowing in his LAD beyond a diagonal vessel which was initially treated medically.  . COLONOSCOPY WITH PROPOFOL Left 09/09/2016   Procedure: COLONOSCOPY WITH PROPOFOL;  Surgeon: Arta Silence, MD;  Location: [facility];  Service: Endoscopy;  Laterality: Left;  . CORONARY ANGIOPLASTY WITH STENT PLACEMENT  April 2012   LAD DES  . CORONARY ARTERY BYPASS GRAFT  1992   with  LIMA to the LAD and diagonal.  . CYSTOSCOPY N/A 03/31/2019   Procedure: CYSTOSCOPY FLEXIBLE;  Surgeon: Lucas Mallow, MD;  Location: [facility];  Service: Urology;  Laterality: N/A;  . ESOPHAGOGASTRODUODENOSCOPY (EGD) WITH PROPOFOL Left 09/08/2016   Procedure: ESOPHAGOGASTRODUODENOSCOPY (EGD) WITH PROPOFOL;  Surgeon: Ronnette Juniper, MD;  Location: [facility];  Service: Gastroenterology;  Laterality: Left;  . INGUINAL HERNIA REPAIR     "don't remeimber which side"  . LAPAROSCOPIC NEPHRECTOMY Right 03/31/2019   Procedure: RIGHT  HAND ASSIST LAPAROSCOPIC NEPHRECTOMY;  Surgeon: Lucas Mallow, MD;  Location: [facility];  Service: Urology;  Laterality: Right;  . PACEMAKER IMPLANT N/A 12/25/2016   Procedure: PACEMAKER IMPLANT;  Surgeon: Deboraha Sprang, MD;  Location: [facility];  Service: Cardiovascular;  Laterality: N/A;  . TONSILLECTOMY         Family History  Problem Relation Age of Onset  . Heart disease Father     Social History   Tobacco Use  . Smoking status: Former Smoker    Packs/day: 1.00    Years: 30.00    Pack years: 30.00    Types: Cigarettes  . Smokeless tobacco: Never Used  . Tobacco comment: quit ~ 1985  Substance Use Topics  . Alcohol use: Yes    Alcohol/week: 0.0 standard drinks    Comment: rare beer  . Drug use: No    Home Medications Prior to Admission medications   Medication Sig Start Date End Date Taking? Authorizing Provider  acetaminophen (TYLENOL) 500 MG tablet Take 1,000 mg by mouth every 6 (six) hours as needed for moderate pain.     [provider]  allopurinol (ZYLOPRIM) 300 MG tablet Take 300 mg by mouth daily.    [provider]  amLODipine (NORVASC) 10 MG tablet Take 1 tablet (10 mg total) by mouth daily. 04/06/19   Lucas Mallow, MD  atorvastatin (LIPITOR) 20 MG tablet TAKE 1 TABLET BY MOUTH DAILY Patient taking differently: Take 20 mg by mouth daily.  12/10/18   Troy Sine, MD  b complex vitamins tablet Take 1  tablet by mouth daily.    [provider]  fenofibrate micronized (LOFIBRA) 134 MG capsule Take 134 mg by mouth daily before breakfast.    [provider]  ferrous sulfate 325 (65 FE) MG tablet Take 325 mg by mouth daily with breakfast.    [provider]  metoprolol succinate (TOPROL-XL) 25 MG 24 hr tablet TAKE 1 TABLET BY MOUTH DAILY Patient taking differently: Take 25 mg by mouth daily.  12/25/18   Troy Sine, MD  omeprazole (PRILOSEC) 20 MG capsule Take 20 mg by mouth at bedtime.     [provider]  oxyCODONE (OXY IR/ROXICODONE) 5 MG immediate release tablet Take 1 tablet (  5 mg total) by mouth every 4 (four) hours as needed for moderate pain. 04/05/19   Lucas Mallow, MD  Rivaroxaban (XARELTO) 15 MG TABS tablet Take 1 tablet (15 mg total) by mouth daily with supper. 09/10/16   Velvet Bathe, MD  Tamsulosin HCl (FLOMAX) 0.4 MG CAPS Take 1 capsule (0.4 mg total) by mouth daily. 07/20/11   Chauncy Passy, MD    Allergies    Nitrostat [nitroglycerin]  Review of Systems   Review of Systems  Constitutional: Negative for fever.  Eyes: Negative for visual disturbance.  Gastrointestinal: Negative for nausea and vomiting.  Musculoskeletal: Positive for neck pain. Negative for arthralgias, back pain, joint swelling and myalgias.  Skin: Positive for wound.  Allergic/Immunologic: Negative for immunocompromised state.  Neurological: Negative for dizziness, weakness and headaches.  Hematological: Bruises/bleeds easily.  Psychiatric/Behavioral: Negative for confusion.  All other systems reviewed and are negative.   Physical Exam Updated Vital Signs BP (!) 157/70   Pulse 73   Temp (!) 97.5 F (36.4 C) (Oral)   Resp 15   SpO2 95%   Physical Exam Vitals and nursing note reviewed.  Constitutional:      General: He is not in acute distress.    Appearance: He is well-developed. He is not diaphoretic.  HENT:     Head: Normocephalic and atraumatic.    Eyes:     Extraocular Movements: Extraocular movements intact.     Pupils: Pupils are equal, round, and reactive to light.  Neck:     Comments: Aspen collar in place Cardiovascular:     Pulses: Normal pulses.  Pulmonary:     Effort: Pulmonary effort is normal.  Abdominal:     Palpations: Abdomen is soft.     Tenderness: There is no abdominal tenderness.     Comments: Suprapubic catheter present  Musculoskeletal:        General: Signs of injury present. No swelling or deformity.     Cervical back: Tenderness present.       Back:     Comments: Minor abrasion to right elbow, minor abrasion to posterior left lower leg.  No bony tenderness, no swelling.  Skin:    General: Skin is warm and dry.     Findings: No erythema or rash.  Neurological:     Mental Status: He is alert and oriented to person, place, and time.  Psychiatric:        Behavior: Behavior normal.     ED Results / Procedures / Treatments   Labs (all labs ordered are listed, but only abnormal results are displayed) Labs Reviewed - No data to display  EKG None  Radiology CT Head Wo Contrast  Result Date: 04/16/2019 CLINICAL DATA:  Recent fall from wheelchair with headaches and neck pain, initial encounter EXAM: CT HEAD WITHOUT CONTRAST CT CERVICAL SPINE WITHOUT CONTRAST TECHNIQUE: Multidetector CT imaging of the head and cervical spine was performed following the standard protocol without intravenous contrast. Multiplanar CT image reconstructions of the cervical spine were also generated. COMPARISON:  None. FINDINGS: CT HEAD FINDINGS Brain: No evidence of acute infarction, hemorrhage, hydrocephalus, extra-axial collection or mass lesion/mass effect. Mild atrophic changes and chronic white matter ischemic changes are seen. Vascular: No hyperdense vessel or unexpected calcification. Skull: Normal. Negative for fracture or focal lesion. Sinuses/Orbits: No acute finding. Other: None. CT CERVICAL SPINE FINDINGS Alignment:  Within normal limits. Skull base and vertebrae: 7 cervical segments are well visualized. Vertebral body height is well maintained. Multilevel disc space narrowing  and osteophytic changes are noted. No acute fracture or acute facet abnormality is seen. Soft tissues and spinal canal: Surrounding soft tissue structures are within normal limits. Upper chest: Visualized lung apices show no acute abnormality. Other: None IMPRESSION: CT of the head: Chronic atrophic and ischemic changes without acute abnormality. CT of the cervical spine: Degenerative change without acute abnormality. Electronically Signed   By: Inez Catalina M.D.   On: 04/16/2019 16:34   CT Cervical Spine Wo Contrast  Result Date: 04/16/2019 CLINICAL DATA:  Recent fall from wheelchair with headaches and neck pain, initial encounter EXAM: CT HEAD WITHOUT CONTRAST CT CERVICAL SPINE WITHOUT CONTRAST TECHNIQUE: Multidetector CT imaging of the head and cervical spine was performed following the standard protocol without intravenous contrast. Multiplanar CT image reconstructions of the cervical spine were also generated. COMPARISON:  None. FINDINGS: CT HEAD FINDINGS Brain: No evidence of acute infarction, hemorrhage, hydrocephalus, extra-axial collection or mass lesion/mass effect. Mild atrophic changes and chronic white matter ischemic changes are seen. Vascular: No hyperdense vessel or unexpected calcification. Skull: Normal. Negative for fracture or focal lesion. Sinuses/Orbits: No acute finding. Other: None. CT CERVICAL SPINE FINDINGS Alignment: Within normal limits. Skull base and vertebrae: 7 cervical segments are well visualized. Vertebral body height is well maintained. Multilevel disc space narrowing and osteophytic changes are noted. No acute fracture or acute facet abnormality is seen. Soft tissues and spinal canal: Surrounding soft tissue structures are within normal limits. Upper chest: Visualized lung apices show no acute abnormality. Other:  None IMPRESSION: CT of the head: Chronic atrophic and ischemic changes without acute abnormality. CT of the cervical spine: Degenerative change without acute abnormality. Electronically Signed   By: Inez Catalina M.D.   On: 04/16/2019 16:34    Procedures Procedures (including critical care time)  Medications Ordered in ED Medications  bacitracin ointment (has no administration in time range)    ED Course  I have reviewed the triage vital signs and the nursing notes.  Pertinent labs & imaging results that were available during my care of the patient were reviewed by me and considered in my medical decision making (see chart for details).  Clinical Course as of Apr 15 1720  Wed Apr 07, 732  [id number] 79 year old male presents after a fall, striking the back of his head on the sidewalk with complaint of back pain no LOC.  Patient is on Xarelto.  On exam patient has pain the posterior C-spine, abrasion to right elbow, abrasion to posterior left lower leg.  No bony tenderness or pain with range of motion of the upper or lower extremities. CT head and c-spine without acute injury. Wounds to be dressed prior to dc. C-collar removed, discussed results with patient, return precautions given.    [LM]    Clinical Course User Index [LM] Roque Lias   MDM Rules/Calculators/A&P                      Final Clinical Impression(s) / ED Diagnoses Final diagnoses:  Fall, initial encounter  Abrasion  Neck pain    Rx / DC Orders ED Discharge Orders    None       Tacy Learn, PA-C 04/16/19 1722    Maudie Flakes, MD 04/23/19 [phone]

## 2019-04-16 NOTE — ED Triage Notes (Addendum)
Pt leaving Urology when walker tilted over, hitting back of head, broken skin areas to rt elbow and left leg. Pt has knot on post head, Pt is on blood thinners, No LOC. C/O pain in back of nedk.

## 2019-04-16 NOTE — Discharge Instructions (Addendum)
Return to ER for any worsening or concerning symptoms. You may take Tylenol as needed as directed for pain. Keep wounds clean and dry.   Recheck with your primary care provider.

## 2019-04-18 DIAGNOSIS — N189 Chronic kidney disease, unspecified: Secondary | ICD-10-CM | POA: Diagnosis not present

## 2019-04-18 DIAGNOSIS — E785 Hyperlipidemia, unspecified: Secondary | ICD-10-CM | POA: Diagnosis not present

## 2019-04-18 DIAGNOSIS — N179 Acute kidney failure, unspecified: Secondary | ICD-10-CM | POA: Diagnosis not present

## 2019-04-18 DIAGNOSIS — I2510 Atherosclerotic heart disease of native coronary artery without angina pectoris: Secondary | ICD-10-CM | POA: Diagnosis not present

## 2019-04-18 DIAGNOSIS — I5043 Acute on chronic combined systolic (congestive) and diastolic (congestive) heart failure: Secondary | ICD-10-CM | POA: Diagnosis not present

## 2019-04-18 DIAGNOSIS — I10 Essential (primary) hypertension: Secondary | ICD-10-CM | POA: Diagnosis not present

## 2019-04-18 DIAGNOSIS — I82402 Acute embolism and thrombosis of unspecified deep veins of left lower extremity: Secondary | ICD-10-CM | POA: Diagnosis not present

## 2019-04-25 ENCOUNTER — Ambulatory Visit (INDEPENDENT_AMBULATORY_CARE_PROVIDER_SITE_OTHER): Payer: Medicare Other | Admitting: *Deleted

## 2019-04-25 DIAGNOSIS — N184 Chronic kidney disease, stage 4 (severe): Secondary | ICD-10-CM | POA: Diagnosis not present

## 2019-04-25 DIAGNOSIS — I441 Atrioventricular block, second degree: Secondary | ICD-10-CM | POA: Diagnosis not present

## 2019-04-25 LAB — CUP PACEART REMOTE DEVICE CHECK
Battery Remaining Longevity: 92 mo
Battery Voltage: 2.92 V
Brady Statistic AP VP Percent: 35.29 %
Brady Statistic AP VS Percent: 0.01 %
Brady Statistic AS VP Percent: 64.12 %
Brady Statistic AS VS Percent: 0.58 %
Brady Statistic RA Percent Paced: 35.7 %
Brady Statistic RV Percent Paced: 99.42 %
Date Time Interrogation Session: 20210416105437
Implantable Lead Implant Date: 20181217
Implantable Lead Implant Date: 20181217
Implantable Lead Location: 753859
Implantable Lead Location: 753859
Implantable Lead Model: 3830
Implantable Lead Model: 5076
Implantable Pulse Generator Implant Date: 20181217
Lead Channel Impedance Value: 304 Ohm
Lead Channel Impedance Value: 342 Ohm
Lead Channel Impedance Value: 380 Ohm
Lead Channel Impedance Value: 418 Ohm
Lead Channel Pacing Threshold Amplitude: 0.625 V
Lead Channel Pacing Threshold Amplitude: 0.625 V
Lead Channel Pacing Threshold Pulse Width: 0.4 ms
Lead Channel Pacing Threshold Pulse Width: 0.4 ms
Lead Channel Sensing Intrinsic Amplitude: 1 mV
Lead Channel Sensing Intrinsic Amplitude: 1 mV
Lead Channel Sensing Intrinsic Amplitude: 14.125 mV
Lead Channel Sensing Intrinsic Amplitude: 14.125 mV
Lead Channel Setting Pacing Amplitude: 2 V
Lead Channel Setting Pacing Amplitude: 2.5 V
Lead Channel Setting Pacing Pulse Width: 1 ms
Lead Channel Setting Sensing Sensitivity: 1.2 mV

## 2019-04-25 NOTE — Progress Notes (Signed)
PPM Remote  

## 2019-05-02 DIAGNOSIS — N184 Chronic kidney disease, stage 4 (severe): Secondary | ICD-10-CM | POA: Diagnosis not present

## 2019-05-09 DIAGNOSIS — N184 Chronic kidney disease, stage 4 (severe): Secondary | ICD-10-CM | POA: Diagnosis not present

## 2019-05-10 DIAGNOSIS — G4733 Obstructive sleep apnea (adult) (pediatric): Secondary | ICD-10-CM | POA: Diagnosis not present

## 2019-05-12 ENCOUNTER — Other Ambulatory Visit: Payer: Self-pay | Admitting: Urology

## 2019-05-13 NOTE — Patient Instructions (Signed)
DUE TO COVID-19 ONLY ONE VISITOR IS ALLOWED TO COME WITH YOU AND STAY IN THE WAITING ROOM ONLY DURING PRE OP AND PROCEDURE DAY OF SURGERY. THE 1 VISITOR MAY VISIT WITH YOU AFTER SURGERY IN YOUR PRIVATE ROOM DURING VISITING HOURS ONLY!  YOU NEED TO HAVE A COVID 19 TEST ON_5/8______ @_10 :20______, THIS TEST MUST BE DONE BEFORE SURGERY, COME  [address], [place] , [address].  ([place]) ONCE YOUR COVID TEST IS COMPLETED, PLEASE BEGIN THE QUARANTINE INSTRUCTIONS AS OUTLINED IN YOUR HANDOUT.                [place]    Your procedure is scheduled on: 05/21/19   Report to [facility] Main  Entrance   Report to admitting at  6:30 AM     Call this number if you have problems the morning of surgery [phone]    Remember: Do not eat food or drink liquids :After Midnight.   BRUSH YOUR TEETH MORNING OF SURGERY AND RINSE YOUR MOUTH OUT, NO CHEWING GUM CANDY OR MINTS.     Take these medicines the morning of surgery with A SIP OF WATER: Metoprolol, Amlodipine, Allopurinol, Tamsulosin, Prilosec                                 You may not have any metal on your body including               piercings  Do not wear jewelry, lotions, powders or deodorant                        Men may shave face and neck.   Do not bring valuables to the hospital. [place].  Contacts, dentures or bridgework may not be worn into surgery.      Patients discharged the day of surgery will not be allowed to drive home.  IF YOU ARE HAVING SURGERY AND GOING HOME THE SAME DAY, YOU MUST HAVE AN ADULT TO DRIVE YOU HOME AND BE WITH YOU FOR 24 HOURS.  YOU MAY GO HOME BY TAXI OR UBER OR ORTHERWISE, BUT AN ADULT MUST ACCOMPANY YOU HOME AND STAY WITH YOU FOR 24 HOURS.  Name and phone number of your driver:  Special Instructions: N/A              Please read over the following fact sheets you were  given: _____________________________________________________________________             [facility] - Preparing for Surgery Before surgery, you can play an important role.   Because skin is not sterile, your skin needs to be as free of germs as possible.   You can reduce the number of germs on your skin by washing with CHG (chlorahexidine gluconate) soap before surgery.   CHG is an antiseptic cleaner which kills germs and bonds with the skin to continue killing germs even after washing. Please DO NOT use if you have an allergy to CHG or antibacterial soaps.   If your skin becomes reddened/irritated stop using the CHG and inform your nurse when you arrive at Short Stay.   You may shave your face/neck.  Please follow these instructions carefully:  1.  Shower with CHG Soap the night before surgery and the  morning of Surgery.  2.  If you choose to wash your hair, wash your hair first as usual with your  normal  shampoo.  3.  After you shampoo, rinse your hair and body thoroughly to remove the  shampoo.                                        4.  Use CHG as you would any other liquid soap.  You can apply chg directly  to the skin and wash                       Gently with a scrungie or clean washcloth.  5.  Apply the CHG Soap to your body ONLY FROM THE NECK DOWN.   Do not use on face/ open                           Wound or open sores. Avoid contact with eyes, ears mouth and genitals (private parts).                       Wash face,  Genitals (private parts) with your normal soap.             6.  Wash thoroughly, paying special attention to the area where your surgery  will be performed.  7.  Thoroughly rinse your body with warm water from the neck down.  8.  DO NOT shower/wash with your normal soap after using and rinsing off  the CHG Soap.             9.  Pat yourself dry with a clean towel.            10.  Wear clean pajamas.            11.  Place clean sheets on your bed the night of  your first shower and do not  sleep with pets. Day of Surgery : Do not apply any lotions/deodorants the morning of surgery.  Please wear clean clothes to the hospital/surgery center.  FAILURE TO FOLLOW THESE INSTRUCTIONS MAY RESULT IN THE CANCELLATION OF YOUR SURGERY PATIENT SIGNATURE_________________________________  NURSE SIGNATURE__________________________________  ________________________________________________________________________

## 2019-05-14 ENCOUNTER — Encounter (HOSPITAL_COMMUNITY)
Admission: RE | Admit: 2019-05-14 | Discharge: 2019-05-14 | Disposition: A | Discharge status: Home / Self Care | Payer: Medicare Other | Source: Ambulatory Visit | Attending: Urology | Admitting: Urology

## 2019-05-14 ENCOUNTER — Other Ambulatory Visit: Payer: Self-pay

## 2019-05-14 ENCOUNTER — Encounter (HOSPITAL_COMMUNITY): Payer: Self-pay

## 2019-05-14 DIAGNOSIS — Z01812 Encounter for preprocedural laboratory examination: Secondary | ICD-10-CM | POA: Insufficient documentation

## 2019-05-14 LAB — BASIC METABOLIC PANEL
Anion gap: 11 (ref 5–15)
BUN: 61 mg/dL — ABNORMAL HIGH (ref 8–23)
CO2: 21 mmol/L — ABNORMAL LOW (ref 22–32)
Calcium: 8.9 mg/dL (ref 8.9–10.3)
Chloride: 106 mmol/L (ref 98–111)
Creatinine, Ser: 3.47 mg/dL — ABNORMAL HIGH (ref 0.61–1.24)
GFR calc Af Amer: 18 mL/min — ABNORMAL LOW (ref >60)
GFR calc non Af Amer: 16 mL/min — ABNORMAL LOW (ref >60)
Glucose, Bld: 137 mg/dL — ABNORMAL HIGH (ref 70–99)
Potassium: 3.8 mmol/L (ref 3.5–5.1)
Sodium: 138 mmol/L (ref 135–145)

## 2019-05-14 LAB — CBC
HCT: 32.1 % — ABNORMAL LOW (ref 39.0–52.0)
Hemoglobin: 10 g/dL — ABNORMAL LOW (ref 13.0–17.0)
MCH: 28.5 pg (ref 26.0–34.0)
MCHC: 31.2 g/dL (ref 30.0–36.0)
MCV: 91.5 fL (ref 80.0–100.0)
Platelets: 220 10*3/uL (ref 150–400)
RBC: 3.51 MIL/uL — ABNORMAL LOW (ref 4.22–5.81)
RDW: 15.8 % — ABNORMAL HIGH (ref 11.5–15.5)
WBC: 7.7 10*3/uL (ref 4.0–10.5)
nRBC: 0 % (ref 0.0–0.2)

## 2019-05-14 NOTE — Progress Notes (Signed)
PCP - D. Audie Pinto Cardiologist - Dr. Corky Downs  Chest x-ray - no EKG - 01/13/19 Stress Test - no ECHO - 2018 Cardiac Cath - no  Sleep Study - yes CPAP - yes  Fasting Blood Sugar - NA Checks Blood Sugar _____ times a day  Blood Thinner Instructions:Xarelto Aspirin Instructions:stop 3 days prior to DOS Last Dose:05/17/19  Anesthesia review:   Patient denies shortness of breath, fever, cough and chest pain at PAT appointment yes  Patient verbalized understanding of instructions that were given to them at the PAT appointment. Patient was also instructed that they will need to review over the PAT instructions again at home before surgery. yes

## 2019-05-15 ENCOUNTER — Encounter: Payer: Self-pay | Admitting: Internal Medicine

## 2019-05-15 NOTE — Progress Notes (Unsigned)
PERIOPERATIVE PRESCRIPTION FOR IMPLANTED CARDIAC DEVICE PROGRAMMING  Patient Information: Name:  Paul Price  DOB:  [birth year]  MRN:  [id number]  {TIP - You do not have to delete this tip  -  Copy the info from the staff message sent by the PAT staff  then press F2 here and paste the information using CTL - V on the next line :[id number]}  Planned Procedure: Cysto Balloon dilation  Surgeon: Gloriann Loan  Date of Procedure: 05/21/19  Cautery will be used.  Position during surgery:    Please send documentation back to:  [facility] (Fax # [phone])   Johny Drilling, RN  05/15/2019 4:14 PM   Device Information:  Clinic EP Physician:  Virl Axe, MD   Device Type:  Pacemaker Manufacturer and Phone #:  [employer]tr[employer]: [phone] Pacemaker Dependent?:  Yes.   Date of Last Device Check:  04/25/2019 Normal Device Function?:  Yes.    Electrophysiologist's Recommendations:   Have magnet available.  Provide continuous ECG monitoring when magnet is used or reprogramming is to be performed.   Procedure should not interfere with device function.  No device programming or magnet placement needed.  Per Device Clinic Standing Orders, York Ram, RN  4:37 PM 05/15/2019

## 2019-05-16 DIAGNOSIS — N184 Chronic kidney disease, stage 4 (severe): Secondary | ICD-10-CM | POA: Diagnosis not present

## 2019-05-17 ENCOUNTER — Other Ambulatory Visit (HOSPITAL_COMMUNITY)
Admission: RE | Admit: 2019-05-17 | Discharge: 2019-05-17 | Disposition: A | Discharge status: Home / Self Care | Payer: Medicare Other | Source: Ambulatory Visit | Attending: Urology | Admitting: Urology

## 2019-05-17 DIAGNOSIS — Z20822 Contact with and (suspected) exposure to covid-19: Secondary | ICD-10-CM | POA: Diagnosis not present

## 2019-05-17 DIAGNOSIS — Z01812 Encounter for preprocedural laboratory examination: Secondary | ICD-10-CM | POA: Diagnosis not present

## 2019-05-17 LAB — SARS CORONAVIRUS 2 (TAT 6-24 HRS): SARS Coronavirus 2: NEGATIVE

## 2019-05-19 DIAGNOSIS — N179 Acute kidney failure, unspecified: Secondary | ICD-10-CM | POA: Diagnosis not present

## 2019-05-19 DIAGNOSIS — I120 Hypertensive chronic kidney disease with stage 5 chronic kidney disease or end stage renal disease: Secondary | ICD-10-CM | POA: Diagnosis not present

## 2019-05-19 DIAGNOSIS — N185 Chronic kidney disease, stage 5: Secondary | ICD-10-CM | POA: Diagnosis not present

## 2019-05-19 DIAGNOSIS — N2581 Secondary hyperparathyroidism of renal origin: Secondary | ICD-10-CM | POA: Diagnosis not present

## 2019-05-19 DIAGNOSIS — D631 Anemia in chronic kidney disease: Secondary | ICD-10-CM | POA: Diagnosis not present

## 2019-05-20 NOTE — Anesthesia Preprocedure Evaluation (Addendum)
Anesthesia Evaluation  Patient identified by MRN, date of birth, ID band Patient awake    Reviewed: Allergy & Precautions, NPO status , Patient's Chart, lab work & pertinent test results, reviewed documented beta blocker date and time   Airway Mallampati: III  TM Distance: >3 FB Neck ROM: Full    Dental no notable dental hx.    Pulmonary sleep apnea and Continuous Positive Airway Pressure Ventilation , former smoker,    Pulmonary exam normal breath sounds clear to auscultation       Cardiovascular hypertension, Pt. on medications and Pt. on home beta blockers + CAD, + Cardiac Stents, + CABG, +CHF and + DVT  Normal cardiovascular exam+ dysrhythmias Atrial Fibrillation + pacemaker + Valvular Problems/Murmurs MR  Rhythm:Regular Rate:Normal  ECG: v-paced rate 68  ECHO: (12/2016) LV EF: 40% -   45%     Neuro/Psych negative neurological ROS  negative psych ROS   GI/Hepatic Neg liver ROS, GERD  Medicated and Controlled,  Endo/Other  negative endocrine ROS  Renal/GU CRFRenal disease     Musculoskeletal  (+) Arthritis , Gout   Abdominal   Peds  Hematology  (+) anemia , HLD   Anesthesia Other Findings URETHRAL STRICTURE  Reproductive/Obstetrics                           Anesthesia Physical Anesthesia Plan  ASA: III  Anesthesia Plan: General   Post-op Pain Management:    Induction: Intravenous  PONV Risk Score and Plan: 2 and Ondansetron, Dexamethasone and Treatment may vary due to age or medical condition  Airway Management Planned: LMA  Additional Equipment:   Intra-op Plan:   Post-operative Plan: Extubation in OR  Informed Consent: I have reviewed the patients History and Physical, chart, labs and discussed the procedure including the risks, benefits and alternatives for the proposed anesthesia with the patient or authorized representative who has indicated his/her understanding and  acceptance.     Dental advisory given  Plan Discussed with: CRNA  Anesthesia Plan Comments:        Anesthesia Quick Evaluation

## 2019-05-21 ENCOUNTER — Ambulatory Visit (HOSPITAL_COMMUNITY): Payer: Medicare Other | Admitting: Anesthesiology

## 2019-05-21 ENCOUNTER — Ambulatory Visit (HOSPITAL_COMMUNITY): Payer: Medicare Other

## 2019-05-21 ENCOUNTER — Encounter (HOSPITAL_COMMUNITY): Payer: Self-pay | Admitting: Urology

## 2019-05-21 ENCOUNTER — Encounter (HOSPITAL_COMMUNITY)
Admission: RE | Disposition: A | Discharge status: Home / Self Care | Payer: Self-pay | Source: Ambulatory Visit | Attending: Urology

## 2019-05-21 ENCOUNTER — Ambulatory Visit (HOSPITAL_COMMUNITY): Payer: Medicare Other | Admitting: Physician Assistant

## 2019-05-21 ENCOUNTER — Ambulatory Visit (HOSPITAL_COMMUNITY)
Admission: RE | Admit: 2019-05-21 | Discharge: 2019-05-21 | Disposition: A | Discharge status: Home / Self Care | Payer: Medicare Other | Source: Ambulatory Visit | Attending: Urology | Admitting: Urology

## 2019-05-21 DIAGNOSIS — N35919 Unspecified urethral stricture, male, unspecified site: Secondary | ICD-10-CM | POA: Insufficient documentation

## 2019-05-21 DIAGNOSIS — Z79899 Other long term (current) drug therapy: Secondary | ICD-10-CM | POA: Insufficient documentation

## 2019-05-21 DIAGNOSIS — Z9350 Unspecified cystostomy status: Secondary | ICD-10-CM | POA: Insufficient documentation

## 2019-05-21 DIAGNOSIS — Z7901 Long term (current) use of anticoagulants: Secondary | ICD-10-CM | POA: Insufficient documentation

## 2019-05-21 DIAGNOSIS — Z955 Presence of coronary angioplasty implant and graft: Secondary | ICD-10-CM | POA: Diagnosis not present

## 2019-05-21 DIAGNOSIS — I2510 Atherosclerotic heart disease of native coronary artery without angina pectoris: Secondary | ICD-10-CM | POA: Insufficient documentation

## 2019-05-21 DIAGNOSIS — C641 Malignant neoplasm of right kidney, except renal pelvis: Secondary | ICD-10-CM | POA: Insufficient documentation

## 2019-05-21 DIAGNOSIS — I110 Hypertensive heart disease with heart failure: Secondary | ICD-10-CM | POA: Insufficient documentation

## 2019-05-21 DIAGNOSIS — I509 Heart failure, unspecified: Secondary | ICD-10-CM | POA: Insufficient documentation

## 2019-05-21 DIAGNOSIS — I5032 Chronic diastolic (congestive) heart failure: Secondary | ICD-10-CM | POA: Diagnosis not present

## 2019-05-21 DIAGNOSIS — N35811 Other urethral stricture, male, meatal: Secondary | ICD-10-CM | POA: Diagnosis not present

## 2019-05-21 DIAGNOSIS — Z951 Presence of aortocoronary bypass graft: Secondary | ICD-10-CM | POA: Diagnosis not present

## 2019-05-21 DIAGNOSIS — I480 Paroxysmal atrial fibrillation: Secondary | ICD-10-CM | POA: Diagnosis not present

## 2019-05-21 DIAGNOSIS — Z87891 Personal history of nicotine dependence: Secondary | ICD-10-CM | POA: Insufficient documentation

## 2019-05-21 DIAGNOSIS — Z950 Presence of cardiac pacemaker: Secondary | ICD-10-CM | POA: Diagnosis not present

## 2019-05-21 DIAGNOSIS — Z905 Acquired absence of kidney: Secondary | ICD-10-CM | POA: Diagnosis not present

## 2019-05-21 DIAGNOSIS — N35911 Unspecified urethral stricture, male, meatal: Secondary | ICD-10-CM | POA: Diagnosis not present

## 2019-05-21 HISTORY — PX: CYSTOSCOPY WITH URETHRAL DILATATION: SHX5125

## 2019-05-21 SURGERY — CYSTOSCOPY, WITH URETHRAL DILATION
Anesthesia: General

## 2019-05-21 MED ORDER — DEXAMETHASONE SODIUM PHOSPHATE 10 MG/ML IJ SOLN
INTRAMUSCULAR | Status: AC
  Filled 2019-05-21: qty 1

## 2019-05-21 MED ORDER — FENTANYL CITRATE (PF) 100 MCG/2ML IJ SOLN
INTRAMUSCULAR | Status: AC
  Filled 2019-05-21: qty 2

## 2019-05-21 MED ORDER — LIDOCAINE 2% (20 MG/ML) 5 ML SYRINGE
INTRAMUSCULAR | Status: AC
  Filled 2019-05-21: qty 5

## 2019-05-21 MED ORDER — SODIUM CHLORIDE 0.9 % IR SOLN
Status: DC | PRN
  Administered 2019-05-21: 3000 mL via INTRAVESICAL

## 2019-05-21 MED ORDER — FENTANYL CITRATE (PF) 100 MCG/2ML IJ SOLN: 25.0000 ug | INTRAMUSCULAR | Status: DC | PRN

## 2019-05-21 MED ORDER — OXYCODONE HCL 5 MG PO TABS: 5.0000 mg | ORAL_TABLET | ORAL | 0 refills | Status: DC | PRN

## 2019-05-21 MED ORDER — ACETAMINOPHEN 500 MG PO TABS
1000.0000 mg | ORAL_TABLET | Freq: Once | ORAL | Status: AC
  Administered 2019-05-21: 1000 mg via ORAL
  Filled 2019-05-21: qty 2

## 2019-05-21 MED ORDER — LACTATED RINGERS IV SOLN: INTRAVENOUS | Status: DC

## 2019-05-21 MED ORDER — IOHEXOL 300 MG/ML  SOLN
INTRAMUSCULAR | Status: DC | PRN
  Administered 2019-05-21: 8 mL

## 2019-05-21 MED ORDER — ONDANSETRON HCL 4 MG/2ML IJ SOLN
INTRAMUSCULAR | Status: DC | PRN
  Administered 2019-05-21: 4 mg via INTRAVENOUS

## 2019-05-21 MED ORDER — PROPOFOL 10 MG/ML IV BOLUS
INTRAVENOUS | Status: DC | PRN
  Administered 2019-05-21: 200 mg via INTRAVENOUS

## 2019-05-21 MED ORDER — ONDANSETRON HCL 4 MG/2ML IJ SOLN
INTRAMUSCULAR | Status: AC
  Filled 2019-05-21: qty 2

## 2019-05-21 MED ORDER — ONDANSETRON HCL 4 MG/2ML IJ SOLN: 4.0000 mg | Freq: Once | INTRAMUSCULAR | Status: DC | PRN

## 2019-05-21 MED ORDER — FENTANYL CITRATE (PF) 100 MCG/2ML IJ SOLN
INTRAMUSCULAR | Status: DC | PRN
  Administered 2019-05-21 (×3): 50 ug via INTRAVENOUS

## 2019-05-21 MED ORDER — PROPOFOL 10 MG/ML IV BOLUS
INTRAVENOUS | Status: AC
  Filled 2019-05-21: qty 20

## 2019-05-21 MED ORDER — LIDOCAINE 2% (20 MG/ML) 5 ML SYRINGE
INTRAMUSCULAR | Status: DC | PRN
  Administered 2019-05-21: 60 mg via INTRAVENOUS

## 2019-05-21 MED ORDER — CEFAZOLIN SODIUM-DEXTROSE 2-4 GM/100ML-% IV SOLN
2.0000 g | INTRAVENOUS | Status: AC
  Administered 2019-05-21: 2 g via INTRAVENOUS
  Filled 2019-05-21: qty 100

## 2019-05-21 SURGICAL SUPPLY — 21 items
BAG URINE DRAIN 2000ML AR STRL (UROLOGICAL SUPPLIES) ×2 IMPLANT
BALLN NEPHROSTOMY (BALLOONS) ×2
BALLOON NEPHROSTOMY (BALLOONS) ×1 IMPLANT
CATH FOLEY 2W COUNCIL 20FR 5CC (CATHETERS) IMPLANT
CATH FOLEY 2W COUNCIL 5CC 18FR (CATHETERS) ×2 IMPLANT
CATH FOLEY 2WAY SLVR 30CC 16FR (CATHETERS) ×2 IMPLANT
CATH INTERMIT  6FR 70CM (CATHETERS) IMPLANT
CATH ROBINSON RED A/P 14FR (CATHETERS) ×2 IMPLANT
CATH URET 5FR 28IN CONE TIP (BALLOONS)
CATH URET 5FR 70CM CONE TIP (BALLOONS) IMPLANT
CLOTH BEACON ORANGE TIMEOUT ST (SAFETY) ×2 IMPLANT
GLOVE BIO SURGEON STRL SZ7.5 (GLOVE) ×2 IMPLANT
GOWN STRL REUS W/TWL XL LVL3 (GOWN DISPOSABLE) ×2 IMPLANT
GUIDEWIRE ANG ZIPWIRE 038X150 (WIRE) IMPLANT
GUIDEWIRE STR DUAL SENSOR (WIRE) ×2 IMPLANT
KIT TURNOVER KIT A (KITS) IMPLANT
MANIFOLD NEPTUNE II (INSTRUMENTS) IMPLANT
NS IRRIG 1000ML POUR BTL (IV SOLUTION) IMPLANT
PENCIL SMOKE EVACUATOR (MISCELLANEOUS) IMPLANT
TRAY CYSTO PACK (CUSTOM PROCEDURE TRAY) ×2 IMPLANT
WATER STERILE IRR 3000ML UROMA (IV SOLUTION) ×2 IMPLANT

## 2019-05-21 NOTE — Transfer of Care (Signed)
Immediate Anesthesia Transfer of Care Note  Patient: Paul Price  Procedure(s) Performed: CYSTOSCOPY WITH URETHRAL BALLOON DILATATION (N/A )  Patient Location: PACU  Anesthesia Type:General  Level of Consciousness: awake, alert  and oriented  Airway & Oxygen Therapy: Patient Spontanous Breathing and Patient connected to face mask oxygen  Post-op Assessment: Report given to RN, Post -op Vital signs reviewed and stable and Patient moving all extremities X 4  Post vital signs: Reviewed and stable  Last Vitals:  Vitals Value Taken Time  BP    Temp    Pulse 60 05/21/19 0917  Resp 9 05/21/19 0917  SpO2 100 % 05/21/19 0917  Vitals shown include unvalidated device data.  Last Pain:  Vitals:   05/21/19 0712  TempSrc: Oral  PainSc:          Complications: No apparent anesthesia complications

## 2019-05-21 NOTE — Anesthesia Postprocedure Evaluation (Signed)
Anesthesia Post Note  Patient: Paul Price  Procedure(s) Performed: CYSTOSCOPY WITH URETHRAL BALLOON DILATATION (N/A )     Patient location during evaluation: PACU Anesthesia Type: General Level of consciousness: awake Pain management: pain level controlled Vital Signs Assessment: post-procedure vital signs reviewed and stable Respiratory status: spontaneous breathing, nonlabored ventilation, respiratory function stable and patient connected to nasal cannula oxygen Cardiovascular status: blood pressure returned to baseline and stable Postop Assessment: no apparent nausea or vomiting Anesthetic complications: no    Last Vitals:  Vitals:   05/21/19 1015 05/21/19 1051  BP: 125/67 131/64  Pulse: (!) 59 60  Resp: 12 16  Temp: (!) 36.4 C   SpO2: 99% 100%    Last Pain:  Vitals:   05/21/19 1051  TempSrc:   PainSc: 0-No pain                 Lashun Mccants P Shaneisha Burkel

## 2019-05-21 NOTE — Discharge Instructions (Addendum)
Keep the catheter in the penis plugged with the catheter plug.  Let the urine drain out the suprapubic tube (the tube going into the belly).  You may notice some blood in the urine and also some blood around the catheter.  This is normal after this procedure.

## 2019-05-21 NOTE — Interval H&P Note (Signed)
History and Physical Interval Note:  05/21/2019 8:25 AM  Paul Price  has presented today for surgery, with the diagnosis of URETHRAL STRICTURE.  The various methods of treatment have been discussed with the patient and family. After consideration of risks, benefits and other options for treatment, the patient has consented to  Procedure(s): CYSTOSCOPY WITH URETHRAL BALLOON DILATATION (N/A) as a surgical intervention.  The patient's history has been reviewed, patient examined, no change in status, stable for surgery.  I have reviewed the patient's chart and labs.  Questions were answered to the patient's satisfaction.     Marton Redwood, III

## 2019-05-21 NOTE — Anesthesia Procedure Notes (Signed)
Procedure Name: LMA Insertion Date/Time: 05/21/2019 8:36 AM Performed by: Niel Hummer, CRNA Pre-anesthesia Checklist: Patient identified, Emergency Drugs available, Suction available and Patient being monitored Patient Re-evaluated:Patient Re-evaluated prior to induction Oxygen Delivery Method: Circle system utilized Preoxygenation: Pre-oxygenation with 100% oxygen Induction Type: IV induction LMA: LMA with gastric port inserted LMA Size: 4.0 Number of attempts: 2 Dental Injury: Teeth and Oropharynx as per pre-operative assessment  Comments: LMA 4 placed, unable to seat. LMA removed. LMA 4 with gastric port placed, seated properly.

## 2019-05-21 NOTE — Op Note (Signed)
Operative Note  Preoperative diagnosis:  1.  Urethral stricture  Postoperative diagnosis: 1.  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  Suprapubic tube exchange, simple 2.  Urethral balloon dilation 3.  Cystoscopy 4.  Complex catheter placement over a wire  Surgeon: Link Snuffer, MD  Assistants: None  Anesthesia: General  Complications: None immediate  EBL: Minimal  Specimens: 1.  None  Drains/Catheters: 1.  16 French suprapubic catheter 2.  31 French council tip penile catheter, capped  Intraoperative findings: 1.  Complete dilation of the entire pendulous urethra and bulbar urethra. 2.  After dilation, cystourethroscopy revealed what appeared to be a long segment of stricture along the majority of the pendulous urethra.  Bulbar urethra appeared normal.  Prostate was borderline obstructing.  There were no masses within the bladder. 3.  Suprapubic tube easily exchanged  Indication: 79 year old male status post hand-assisted laparoscopic nephrectomy.  During the surgery, the urethra was unable to be accessed and a suprapubic tube was placed.  He presents after a 6-week period of rest to undergo possible urethral dilation and suprapubic tube change  Description of procedure:  The patient was identified and consent was obtained.  The patient was taken to the operating room and placed in the supine position.  The patient was placed under general anesthesia.  Perioperative antibiotics were administered.  The patient was placed in dorsal lithotomy.  Patient was prepped and draped in a standard sterile fashion and a timeout was performed.  The urethral meatus was noted to be stenotic.  I was able to pass a sensor wire into the urethra and into the bladder.  Positioning within the bladder was confirmed with fluoroscopy.  I then passed a urethral balloon dilator over the wire and dilated the pendulous urethra and the bulbar urethra with the urethral  balloon dilator.  I allowed the dilation to remain for about 1 minute.  Positioning was confirmed with fluoroscopy.  I deflated the balloon and removed it.  I kept the wire in place.  I advanced a 56 French rigid cystoscope into the urethra and inspected the entire urethra and passed it into the bladder.  The findings are noted above.  I exchange the suprapubic tube with a 16 French catheter.  I instilled 10 cc of sterile water into the catheter balloon.  I withdrew the scope inspecting the urethra upon removal with no additional findings.  I passed a 93 [place] council tip catheter over a wire and into the bladder and instilled 10 cc of sterile water into the catheter balloon.  I capped the penile catheter and placed the suprapubic tube to drainage.  This cleared the operation.  Patient tolerated the procedure well and was stable postoperatively.  Plan: Return in 1 week for penile catheter removal.  I will keep the suprapubic tube in place for an extra week to ensure that he voids adequately.  The suprapubic tube should be capped once the penile catheter is removed to ensure he is able to void well.

## 2019-05-21 NOTE — H&P (Signed)
CC/HPI: CC: Right renal mass  HPI:  02/06/2019  79 year old male who I followed in the past for meatal stenosis. He is doing okay in this regard. However, he was recently evaluated for chronic renal insufficiency. Renal ultrasound was performed that showed a 7 cm complex heterogeneous mass in the upper pole of the right kidney that was predominantly solid in appearance concerning for renal cell carcinoma. He is unable to receive contrast since his last GFR was 30. He is unable to get an MRI because he has a pacemaker. He has not had any other imaging. He denies any hematuria or dysuria. He also had some small left renal cyst. Last renal ultrasound was in 2015. This revealed multiple small cyst the largest of which was 3 cm in the lower pole of the right kidney.   04/16/19 Mr. Hauk presents after he underwent a right radical nephrectomy on 03/31/19. This was complicated by a urethral stricture requiring S/P tube placement. His hospital course was uncomplicated. He presents today for a post-operative appointment and to have his staples removed. Incision site is healing well and is clean. He denies any drainage, tenderness or bleeding. He has been having regualr bowel movements. He denies fevers, confusion and worsening fatigue.   05/09/2019  Patient has been doing well overall. 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 He has no complaints today. He continues with a suprapubic catheter.     ALLERGIES: Nitroglycerin SUBL    MEDICATIONS: Metoprolol Succinate  Omeprazole  Tamsulosin Hcl 0.4 mg capsule TAKE 1 CAPSULE BY MOUTH ONCE DAILY  Allopurinol 300 mg tablet Oral  Amlodipine Besylate 10 mg tablet Oral  Atorvastatin Calcium 10 mg tablet Oral  Fenofibrate Micronized 134 MG Oral Capsule Oral  Ferrous Sulfate  Furosemide 40 mg tablet Oral  Losartan Potassium 50 mg tablet  Pantoprazole Sodium 40 mg tablet, delayed release  Potassium Chloride 20 meq tablet,  extended release  Tamsulosin HCl - 0.4 MG Oral Capsule 0 Oral Daily  Tylenol  Xarelto 15 mg tablet     GU PSH: Cysto Dilate Stricture (M or F) - 2011 Drain Bl W/Cath Insertion - 03/31/2019 Radical nephrectomy (laparoscopic), Right - 03/31/2019       PSH Notes: Cystoscopy For Urethral Stricture, Coronary Artery Bypass Graft (CABG)   NON-GU PSH: CABG (coronary artery bypass grafting) - 2011 Pacemaker placement     GU PMH: Right uncertain neoplasm of kidney - 02/06/2019 Incomplete bladder emptying - 10/05/2017 Balanitis Xerotica Obliterans (Lichen sclerosis), Balanitis xerotica obliterans - 2016 BPH w/LUTS, 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, Unspec, Meatal stenosis - 2016 Urinary Frequency, Increased urinary frequency - 2016 ED due to arterial insufficiency, Erectile dysfunction due to arterial insufficiency - 2014 Gross hematuria, Gross hematuria - 2014 Renal cyst, Renal cysts, acquired, bilateral - 2014 Urinary Retention, Unspec, Urinary retention - 2014 Urinary Tract Inf, Unspec site, Urinary tract infection - 2014      PMH Notes:  2009-07-01 14:03:29 - Note: Coronary Artery Disease  2009-07-01 14:15:39 - Note: Gout  2009-07-01 14:15:39 - Note: Arthritis   NON-GU PMH: Encounter for change or removal of surgical wound dressing - 04/16/2019 Unspecified anterior urethral stricture, male - 10/05/2017 Encounter for general adult medical examination without abnormal findings, Encounter for preventive health examination - 2016 Personal history of other diseases of the circulatory system, History of hypertension - 2014 Personal history of other diseases of the digestive system, History of esophageal reflux - 2014 Personal history of other  diseases of the nervous system and sense organs, History of sleep apnea - 2014 Personal history of other endocrine, nutritional and metabolic disease, History of hypercholesterolemia - 2014    FAMILY  HISTORY: Acute Myocardial Infarction - Father Family Health Status Number - Father Father Deceased At Age53 ___ - Runs In Family Mother Deceased At Age 38 from diabetic complicati - Runs In Family nephrolithiasis - Son   SOCIAL HISTORY: Marital Status: Married Preferred Language: English; Race: White Current Smoking Status: Patient does not smoke anymore. Has not smoked since 09/09/1957.   Tobacco Use Assessment Completed: Used Tobacco in last 30 days? Does not drink caffeine.     Notes: Former smoker, Tobacco Use, Alcohol Use, Occupation:, Caffeine Use, Marital History - Currently Married   REVIEW OF SYSTEMS:    GU Review Male:   Patient denies frequent urination, hard to postpone urination, burning/ pain with urination, get up at night to urinate, leakage of urine, stream starts and stops, trouble starting your stream, have to strain to urinate , erection problems, and penile pain.  Gastrointestinal (Upper):   Patient denies nausea, vomiting, and indigestion/ heartburn.  Gastrointestinal (Lower):   Patient denies diarrhea and constipation.  Constitutional:   Patient denies fever, night sweats, weight loss, and fatigue.  Skin:   Patient denies skin rash/ lesion and itching.  Eyes:   Patient denies blurred vision and double vision.  Ears/ Nose/ Throat:   Patient denies sore throat and sinus problems.  Hematologic/Lymphatic:   Patient denies swollen glands and easy bruising.  Cardiovascular:   Patient denies leg swelling and chest pains.  Respiratory:   Patient denies cough and shortness of breath.  Endocrine:   Patient denies excessive thirst.  Musculoskeletal:   Patient denies back pain and joint pain.  Neurological:   Patient denies dizziness and headaches.  Psychologic:   Patient denies depression and anxiety.   VITAL SIGNS:      05/09/2019 07:57 AM  BP 148/67 mmHg  Heart Rate 80 /min  Temperature 97.5 F / 36.3 C   GU PHYSICAL EXAMINATION:    Penis: Urine draining clear yellow  urine   MULTI-SYSTEM PHYSICAL EXAMINATION:    Gastrointestinal: No mass, no tenderness, no rigidity, non obese abdomen. Incisions well healed.     Complexity of Data:  Records Review:   Pathology Reports, Previous Patient Records  Urine Test Review:   Urinalysis   12/16/15 10/10/14  PSA  Total PSA 3.63  4.21   Free PSA  0.81   % Free PSA  19     08/02/09  Hormones  Testosterone, Total 268.0     PROCEDURES: None   ASSESSMENT:      ICD-10 Details  1 GU:   Urethral Stricture, Unspec - N35.9 Chronic, Stable  2   Renal cell carcinoma, right - C64.1 Undiagnosed New Problem   PLAN:           Document Letter(s):  Created for Patient: Clinical Summary         Notes:   Plan for CT of the abdomen as well as chest x-ray in 6 months   Plan to proceed to the operating room in a couple of weeks for attempted urethral dilation   CC: Dr. Shelia Media    Signed by Link Snuffer, III, M.D. on 05/09/19 at 8:38 AM (EDT

## 2019-05-23 ENCOUNTER — Telehealth: Payer: Self-pay | Admitting: Hematology

## 2019-05-23 NOTE — Telephone Encounter (Signed)
Called pt per 5/13 sch message - no answer . Left message for patient to call back for reschedule.

## 2019-06-04 DIAGNOSIS — N35911 Unspecified urethral stricture, male, meatal: Secondary | ICD-10-CM | POA: Diagnosis not present

## 2019-06-10 DIAGNOSIS — G4733 Obstructive sleep apnea (adult) (pediatric): Secondary | ICD-10-CM | POA: Diagnosis not present

## 2019-06-13 ENCOUNTER — Other Ambulatory Visit: Payer: Self-pay | Admitting: *Deleted

## 2019-06-13 DIAGNOSIS — N186 End stage renal disease: Secondary | ICD-10-CM

## 2019-06-17 ENCOUNTER — Other Ambulatory Visit: Payer: Self-pay

## 2019-06-17 ENCOUNTER — Encounter: Payer: Self-pay | Admitting: Vascular Surgery

## 2019-06-17 ENCOUNTER — Ambulatory Visit (INDEPENDENT_AMBULATORY_CARE_PROVIDER_SITE_OTHER): Payer: Medicare Other | Admitting: Vascular Surgery

## 2019-06-17 ENCOUNTER — Ambulatory Visit (HOSPITAL_COMMUNITY)
Admission: RE | Admit: 2019-06-17 | Discharge: 2019-06-17 | Disposition: A | Discharge status: Home / Self Care | Payer: Medicare Other | Source: Ambulatory Visit | Attending: Vascular Surgery | Admitting: Vascular Surgery

## 2019-06-17 ENCOUNTER — Ambulatory Visit (INDEPENDENT_AMBULATORY_CARE_PROVIDER_SITE_OTHER)
Admission: RE | Admit: 2019-06-17 | Discharge: 2019-06-17 | Disposition: A | Discharge status: Home / Self Care | Payer: Medicare Other | Source: Ambulatory Visit | Attending: Vascular Surgery | Admitting: Vascular Surgery

## 2019-06-17 VITALS — BP 127/57 | HR 61 | Temp 97.3°F | Resp 20 | Ht 66.0 in | Wt 181.4 lb

## 2019-06-17 DIAGNOSIS — N184 Chronic kidney disease, stage 4 (severe): Secondary | ICD-10-CM | POA: Diagnosis not present

## 2019-06-17 DIAGNOSIS — N186 End stage renal disease: Secondary | ICD-10-CM

## 2019-06-17 DIAGNOSIS — N185 Chronic kidney disease, stage 5: Secondary | ICD-10-CM | POA: Diagnosis not present

## 2019-06-17 NOTE — Progress Notes (Signed)
[facility]  [name] MRN: [id number] DOB: [birth year] Sex: male  REASON FOR CONSULT: Discuss access for hemodialysis  HPI: Paul Price is a 79 y.o. male, who is here today with his wife for discussion of access for hemodialysis.  He has progressive renal insufficiency but does not have end-stage renal disease.  He has reached a point where we have been asked to place a fistula if appropriate but have been asked to hold on AV graft if he has an adequate veins.  He has never had dialysis in the past.  Does have a history of coronary artery disease and chronic atrial fibrillation.  Past Medical History:  Diagnosis Date  . Arthritis    "shoulders" (09/07/2016)  . Atrial fibrillation ([place])   . BPH (benign prostatic hyperplasia)   . Chronic kidney disease (CKD), stage III (moderate)   . Coronary artery disease   . DDD (degenerative disc disease), cervical   . DVT (deep venous thrombosis) ([place]) 12/2013   LLE  . GERD (gastroesophageal reflux disease)   . Gout   . High cholesterol   . History of blood transfusion 09/06/2016   2 u PRBC  . History of scarlet fever 1940s  . History of stress test 06/2011   No significant ischemia, this is a low risk scan. Clinical correlation recommended Abnormal myocardial perfusion study.  Marland Kitchen Hx of echocardiogram 05/2009   EF 40-45%, he did have mild annular calcification with mild-to-moderate MR and mild TR as well as aortic valve sclerosis. Estimated RV systolic pressure was 21 mm.  . Hypertension   . Iron deficiency anemia   . Kidney failure   . 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 Neck pain    "not chronic" (09/07/2016)  . Obesity   . OSA on CPAP   . RBBB   . Renal insufficiency   . Right renal mass   . Spinal stenosis of lumbar region     Family History  Problem Relation Age of Onset  . Heart disease Father     SOCIAL HISTORY: Social History    Socioeconomic History  . Marital status: Married    Spouse name: Not on file  . Number of children: Not on file  . Years of education: Not on file  . Highest education level: Not on file  Occupational History  . Not on file  Tobacco Use  . Smoking status: Former Smoker    Packs/day: 1.00    Years: 30.00    Pack years: 30.00    Types: Cigarettes  . Smokeless tobacco: Never Used  . Tobacco comment: quit ~ 1985  Substance and Sexual Activity  . Alcohol use: Yes    Alcohol/week: 0.0 standard drinks    Comment: rare beer  . Drug use: No  . Sexual activity: Not on file  Other Topics Concern  . Not on file  Social History Narrative  . Not on file   Social Determinants of Health   Financial Resource Strain:   . Difficulty of Paying Living Expenses:   Food Insecurity:   . Worried About Charity fundraiser in the Last Year:   . Arboriculturist in the Last Year:   Transportation Needs:   . Film/video editor (Medical):   Marland Kitchen Lack of Transportation (Non-Medical):   Physical Activity:   . Days of Exercise per Week:   . Minutes of Exercise per Session:  Stress:   . Feeling of Stress :   Social Connections:   . Frequency of Communication with Friends and Family:   . Frequency of Social Gatherings with Friends and Family:   . Attends Religious Services:   . Active Member of Clubs or Organizations:   . Attends Archivist Meetings:   Marland Kitchen Marital Status:   Intimate Partner Violence:   . Fear of Current or Ex-Partner:   . Emotionally Abused:   Marland Kitchen Physically Abused:   . Sexually Abused:     Allergies  Allergen Reactions  . Nitrostat [Nitroglycerin] Other (See Comments)    Causes blood pressure to "bottom out"    Current Outpatient Medications  Medication Sig Dispense Refill  . acetaminophen (TYLENOL) 500 MG tablet Take 1,000 mg by mouth every 6 (six) hours as needed for moderate pain.     Marland Kitchen allopurinol (ZYLOPRIM) 300 MG tablet Take 300 mg by mouth daily.     Marland Kitchen amLODipine (NORVASC) 5 MG tablet Take 7.5 mg by mouth daily.    Marland Kitchen atorvastatin (LIPITOR) 20 MG tablet TAKE 1 TABLET BY MOUTH DAILY (Patient taking differently: Take 20 mg by mouth daily. ) 90 tablet 2  . b complex vitamins tablet Take 1 tablet by mouth daily.    . fenofibrate micronized (LOFIBRA) 134 MG capsule Take 134 mg by mouth daily before breakfast.    . ferrous sulfate 325 (65 FE) MG tablet Take 325 mg by mouth daily with breakfast.    . furosemide (LASIX) 20 MG tablet Take 10-20 mg by mouth See admin instructions. 20 mg on even days, 10 mg on odd days    . metoprolol succinate (TOPROL-XL) 25 MG 24 hr tablet TAKE 1 TABLET BY MOUTH DAILY (Patient taking differently: Take 25 mg by mouth daily. ) 90 tablet 3  . omeprazole (PRILOSEC) 20 MG capsule Take 20 mg by mouth at bedtime.     . Rivaroxaban (XARELTO) 15 MG TABS tablet Take 1 tablet (15 mg total) by mouth daily with supper. 30 tablet 1  . Tamsulosin HCl (FLOMAX) 0.4 MG CAPS Take 1 capsule (0.4 mg total) by mouth daily. 30 capsule 0   No current facility-administered medications for this visit.    REVIEW OF SYSTEMS:  [X]  denotes positive finding, [ ]  denotes negative finding Cardiac  Comments:  Chest pain or chest pressure:    Shortness of breath upon exertion:    Short of breath when lying flat:    Irregular heart rhythm: x       Vascular    Pain in calf, thigh, or hip brought on by ambulation:    Pain in feet at night that wakes you up from your sleep:     Blood clot in your veins:    Leg swelling:  x       Pulmonary    Oxygen at home:    Productive cough:     Wheezing:         Neurologic    Sudden weakness in arms or legs:     Sudden numbness in arms or legs:     Sudden onset of difficulty speaking or slurred speech:    Temporary loss of vision in one eye:     Problems with dizziness:         Gastrointestinal    Blood in stool:     Vomited blood:         Genitourinary    Burning when urinating:  Blood in  urine: x       Psychiatric    Major depression:         Hematologic    Bleeding problems:    Problems with blood clotting too easily:        Skin    Rashes or ulcers:        Constitutional    Fever or chills:      PHYSICAL EXAM: Vitals:   06/17/19 1345  BP: (!) 127/57  Pulse: 61  Resp: 20  Temp: (!) 97.3 F (36.3 C)  SpO2: 98%  Weight: 181 lb 6.4 oz (82.3 kg)  Height: 5\' 6"  (1.676 m)    GENERAL: The patient is a well-nourished male, in no acute distress. The vital signs are documented above. CARDIOVASCULAR: 2+ radial and 2+ ulnar pulses bilaterally.  Small surface veins bilaterally PULMONARY: There is good air exchange  ABDOMEN: Soft and non-tender  MUSCULOSKELETAL: There are no major deformities or cyanosis. NEUROLOGIC: No focal weakness or paresthesias are detected. SKIN: There are no ulcers or rashes noted. PSYCHIATRIC: The patient has a normal affect.  DATA:  Noninvasive studies today reveal normal triphasic arterial flow in his radial and ulnar arteries bilaterally.  Vein map shows small cephalic veins bilaterally.  Moderate to large basilic veins bilaterally somewhat larger on the left than the right  MEDICAL ISSUES: Patient is right-handed.  I had a very long discussion with the patient and his wife regarding options for hemodialysis.  I discussed tunnel catheter, AV graft and AV fistula and the advantages and disadvantages of each of these.  He also had questions regarding peritoneal dialysis and I answered these as well.  He does have good size basilic veins bilaterally and feel that he is a good candidate for fistula creation.  His left basilic vein is actually larger than his right but he does have a left subclavian pacemaker.  This puts him at increased risk for left subclavian stenosis or occlusion and therefore would recommend a right arm basilic vein fistula.  Explained that this is typically done in a two-stage fashion but that if his vein was very large at  the time of surgery could potentially do this on 1 stage.  He has a office visit with Dr. Marval Regal later this week and will discuss timing as well.  Recommend right basilic vein fistula as his initial access.   Rosetta Posner, MD FACS [facility] Tel [phone] Pager [phone]

## 2019-06-19 DIAGNOSIS — N2581 Secondary hyperparathyroidism of renal origin: Secondary | ICD-10-CM | POA: Diagnosis not present

## 2019-06-19 DIAGNOSIS — N179 Acute kidney failure, unspecified: Secondary | ICD-10-CM | POA: Diagnosis not present

## 2019-06-19 DIAGNOSIS — D631 Anemia in chronic kidney disease: Secondary | ICD-10-CM | POA: Diagnosis not present

## 2019-06-19 DIAGNOSIS — N185 Chronic kidney disease, stage 5: Secondary | ICD-10-CM | POA: Diagnosis not present

## 2019-06-19 DIAGNOSIS — I120 Hypertensive chronic kidney disease with stage 5 chronic kidney disease or end stage renal disease: Secondary | ICD-10-CM | POA: Diagnosis not present

## 2019-06-20 DIAGNOSIS — G4733 Obstructive sleep apnea (adult) (pediatric): Secondary | ICD-10-CM | POA: Diagnosis not present

## 2019-06-27 ENCOUNTER — Other Ambulatory Visit (HOSPITAL_COMMUNITY): Payer: Self-pay

## 2019-06-27 NOTE — Discharge Instructions (Signed)

## 2019-06-30 ENCOUNTER — Encounter (HOSPITAL_COMMUNITY)
Admission: RE | Admit: 2019-06-30 | Discharge: 2019-06-30 | Disposition: A | Discharge status: Home / Self Care | Payer: Medicare Other | Source: Ambulatory Visit | Attending: Nephrology | Admitting: Nephrology

## 2019-06-30 ENCOUNTER — Other Ambulatory Visit: Payer: Self-pay

## 2019-06-30 DIAGNOSIS — N189 Chronic kidney disease, unspecified: Secondary | ICD-10-CM | POA: Diagnosis not present

## 2019-06-30 DIAGNOSIS — D631 Anemia in chronic kidney disease: Secondary | ICD-10-CM | POA: Diagnosis not present

## 2019-06-30 MED ORDER — SODIUM CHLORIDE 0.9 % IV SOLN
510.0000 mg | INTRAVENOUS | Status: DC
  Administered 2019-06-30: 510 mg via INTRAVENOUS
  Filled 2019-06-30: qty 17

## 2019-07-07 ENCOUNTER — Other Ambulatory Visit: Payer: Self-pay

## 2019-07-07 ENCOUNTER — Ambulatory Visit (HOSPITAL_COMMUNITY)
Admission: RE | Admit: 2019-07-07 | Discharge: 2019-07-07 | Disposition: A | Discharge status: Home / Self Care | Payer: Medicare Other | Source: Ambulatory Visit | Attending: Nephrology | Admitting: Nephrology

## 2019-07-07 DIAGNOSIS — N189 Chronic kidney disease, unspecified: Secondary | ICD-10-CM | POA: Insufficient documentation

## 2019-07-07 DIAGNOSIS — D631 Anemia in chronic kidney disease: Secondary | ICD-10-CM | POA: Insufficient documentation

## 2019-07-07 MED ORDER — SODIUM CHLORIDE 0.9 % IV SOLN
510.0000 mg | INTRAVENOUS | Status: DC
  Administered 2019-07-07: 510 mg via INTRAVENOUS
  Filled 2019-07-07: qty 17

## 2019-07-10 DIAGNOSIS — G4733 Obstructive sleep apnea (adult) (pediatric): Secondary | ICD-10-CM | POA: Diagnosis not present

## 2019-07-11 ENCOUNTER — Telehealth: Payer: Self-pay | Admitting: Hematology

## 2019-07-11 NOTE — Telephone Encounter (Signed)
Called pt per 7/2 sch msg - no answer . Left message for pt to call back to reschedule.

## 2019-07-15 ENCOUNTER — Inpatient Hospital Stay: Payer: Medicare Other

## 2019-07-15 ENCOUNTER — Inpatient Hospital Stay: Payer: Medicare Other | Attending: Hematology | Admitting: Hematology

## 2019-07-21 DIAGNOSIS — N185 Chronic kidney disease, stage 5: Secondary | ICD-10-CM | POA: Diagnosis not present

## 2019-07-21 DIAGNOSIS — I10 Essential (primary) hypertension: Secondary | ICD-10-CM | POA: Diagnosis not present

## 2019-07-24 DIAGNOSIS — Z4902 Encounter for fitting and adjustment of peritoneal dialysis catheter: Secondary | ICD-10-CM | POA: Diagnosis not present

## 2019-07-25 ENCOUNTER — Ambulatory Visit (INDEPENDENT_AMBULATORY_CARE_PROVIDER_SITE_OTHER): Payer: Medicare Other | Admitting: *Deleted

## 2019-07-25 DIAGNOSIS — I441 Atrioventricular block, second degree: Secondary | ICD-10-CM

## 2019-07-25 LAB — CUP PACEART REMOTE DEVICE CHECK
Battery Remaining Longevity: 84 mo
Battery Voltage: 2.91 V
Brady Statistic AP VP Percent: 30.14 %
Brady Statistic AP VS Percent: 0.05 %
Brady Statistic AS VP Percent: 68.46 %
Brady Statistic AS VS Percent: 1.35 %
Brady Statistic RA Percent Paced: 31.2 %
Brady Statistic RV Percent Paced: 98.6 %
Date Time Interrogation Session: 20210715194030
Implantable Lead Implant Date: 20181217
Implantable Lead Implant Date: 20181217
Implantable Lead Location: 753859
Implantable Lead Location: 753859
Implantable Lead Model: 3830
Implantable Lead Model: 5076
Implantable Pulse Generator Implant Date: 20181217
Lead Channel Impedance Value: 285 Ohm
Lead Channel Impedance Value: 304 Ohm
Lead Channel Impedance Value: 342 Ohm
Lead Channel Impedance Value: 380 Ohm
Lead Channel Pacing Threshold Amplitude: 0.5 V
Lead Channel Pacing Threshold Amplitude: 0.75 V
Lead Channel Pacing Threshold Pulse Width: 0.4 ms
Lead Channel Pacing Threshold Pulse Width: 0.4 ms
Lead Channel Sensing Intrinsic Amplitude: 0.625 mV
Lead Channel Sensing Intrinsic Amplitude: 0.625 mV
Lead Channel Sensing Intrinsic Amplitude: 14.25 mV
Lead Channel Sensing Intrinsic Amplitude: 14.25 mV
Lead Channel Setting Pacing Amplitude: 2 V
Lead Channel Setting Pacing Amplitude: 2.5 V
Lead Channel Setting Pacing Pulse Width: 1 ms
Lead Channel Setting Sensing Sensitivity: 1.2 mV

## 2019-07-29 DIAGNOSIS — I120 Hypertensive chronic kidney disease with stage 5 chronic kidney disease or end stage renal disease: Secondary | ICD-10-CM | POA: Diagnosis not present

## 2019-07-29 DIAGNOSIS — D631 Anemia in chronic kidney disease: Secondary | ICD-10-CM | POA: Diagnosis not present

## 2019-07-29 DIAGNOSIS — N2581 Secondary hyperparathyroidism of renal origin: Secondary | ICD-10-CM | POA: Diagnosis not present

## 2019-07-29 DIAGNOSIS — N185 Chronic kidney disease, stage 5: Secondary | ICD-10-CM | POA: Diagnosis not present

## 2019-07-29 NOTE — Progress Notes (Signed)
Remote pacemaker transmission.   

## 2019-08-06 ENCOUNTER — Telehealth: Payer: Self-pay | Admitting: Hematology

## 2019-08-06 NOTE — Telephone Encounter (Signed)
Scheduled appt per 7/27 sch msg - pt is aware of appt.

## 2019-08-10 DIAGNOSIS — G4733 Obstructive sleep apnea (adult) (pediatric): Secondary | ICD-10-CM | POA: Diagnosis not present

## 2019-08-12 ENCOUNTER — Inpatient Hospital Stay: Payer: Medicare Other | Admitting: Hematology

## 2019-08-12 ENCOUNTER — Telehealth: Payer: Self-pay

## 2019-08-12 ENCOUNTER — Inpatient Hospital Stay: Payer: Medicare Other | Attending: Hematology

## 2019-08-12 ENCOUNTER — Telehealth: Payer: Self-pay | Admitting: Hematology

## 2019-08-12 ENCOUNTER — Other Ambulatory Visit: Payer: Self-pay

## 2019-08-12 DIAGNOSIS — N1832 Chronic kidney disease, stage 3b: Secondary | ICD-10-CM

## 2019-08-12 DIAGNOSIS — Z87891 Personal history of nicotine dependence: Secondary | ICD-10-CM | POA: Insufficient documentation

## 2019-08-12 DIAGNOSIS — D649 Anemia, unspecified: Secondary | ICD-10-CM

## 2019-08-12 DIAGNOSIS — D509 Iron deficiency anemia, unspecified: Secondary | ICD-10-CM

## 2019-08-12 DIAGNOSIS — I129 Hypertensive chronic kidney disease with stage 1 through stage 4 chronic kidney disease, or unspecified chronic kidney disease: Secondary | ICD-10-CM | POA: Diagnosis not present

## 2019-08-12 DIAGNOSIS — D631 Anemia in chronic kidney disease: Secondary | ICD-10-CM | POA: Diagnosis not present

## 2019-08-12 DIAGNOSIS — N189 Chronic kidney disease, unspecified: Secondary | ICD-10-CM | POA: Insufficient documentation

## 2019-08-12 DIAGNOSIS — I480 Paroxysmal atrial fibrillation: Secondary | ICD-10-CM | POA: Insufficient documentation

## 2019-08-12 LAB — CBC WITH DIFFERENTIAL/PLATELET
Abs Immature Granulocytes: 0.03 10*3/uL (ref 0.00–0.07)
Basophils Absolute: 0 10*3/uL (ref 0.0–0.1)
Basophils Relative: 0 %
Eosinophils Absolute: 0.2 10*3/uL (ref 0.0–0.5)
Eosinophils Relative: 3 %
HCT: 27 % — ABNORMAL LOW (ref 39.0–52.0)
Hemoglobin: 8.2 g/dL — ABNORMAL LOW (ref 13.0–17.0)
Immature Granulocytes: 0 %
Lymphocytes Relative: 14 %
Lymphs Abs: 1 10*3/uL (ref 0.7–4.0)
MCH: 31.7 pg (ref 26.0–34.0)
MCHC: 30.4 g/dL (ref 30.0–36.0)
MCV: 104.2 fL — ABNORMAL HIGH (ref 80.0–100.0)
Monocytes Absolute: 0.3 10*3/uL (ref 0.1–1.0)
Monocytes Relative: 5 %
Neutro Abs: 5.6 10*3/uL (ref 1.7–7.7)
Neutrophils Relative %: 78 %
Platelets: 141 10*3/uL — ABNORMAL LOW (ref 150–400)
RBC: 2.59 MIL/uL — ABNORMAL LOW (ref 4.22–5.81)
RDW: 16.5 % — ABNORMAL HIGH (ref 11.5–15.5)
WBC: 7.1 10*3/uL (ref 4.0–10.5)
nRBC: 0 % (ref 0.0–0.2)

## 2019-08-12 LAB — CMP (CANCER CENTER ONLY)
ALT: 13 U/L (ref 0–44)
AST: 21 U/L (ref 15–41)
Albumin: 3.4 g/dL — ABNORMAL LOW (ref 3.5–5.0)
Alkaline Phosphatase: 124 U/L (ref 38–126)
Anion gap: 7 (ref 5–15)
BUN: 48 mg/dL — ABNORMAL HIGH (ref 8–23)
CO2: 20 mmol/L — ABNORMAL LOW (ref 22–32)
Calcium: 9.5 mg/dL (ref 8.9–10.3)
Chloride: 112 mmol/L — ABNORMAL HIGH (ref 98–111)
Creatinine: 3.14 mg/dL (ref 0.61–1.24)
GFR, Est AFR Am: 20 mL/min — ABNORMAL LOW (ref >60)
GFR, Estimated: 18 mL/min — ABNORMAL LOW (ref >60)
Glucose, Bld: 109 mg/dL — ABNORMAL HIGH (ref 70–99)
Potassium: 4.2 mmol/L (ref 3.5–5.1)
Sodium: 139 mmol/L (ref 135–145)
Total Bilirubin: 0.3 mg/dL (ref 0.3–1.2)
Total Protein: 6.7 g/dL (ref 6.5–8.1)

## 2019-08-12 LAB — IRON AND TIBC
Iron: 59 ug/dL (ref 42–163)
Saturation Ratios: 23 % (ref 20–55)
TIBC: 258 ug/dL (ref 202–409)
UIBC: 198 ug/dL (ref 117–376)

## 2019-08-12 LAB — VITAMIN B12: Vitamin B-12: 985 pg/mL — ABNORMAL HIGH (ref 180–914)

## 2019-08-12 LAB — FERRITIN: Ferritin: 85 ng/mL (ref 24–336)

## 2019-08-12 NOTE — Telephone Encounter (Signed)
Critical Value: Creatinine 3.14 Inocencio Homes RN notified

## 2019-08-12 NOTE — Progress Notes (Signed)
HEMATOLOGY/ONCOLOGY CLINIC NOTE  Date of Service: 08/12/2019  Patient Care Team: [name], MD as PCP - General (Internal Medicine) Troy Sine, MD as PCP - Cardiology (Cardiology)  CHIEF COMPLAINTS/PURPOSE OF CONSULTATION:  Anemia  HISTORY OF PRESENTING ILLNESS:   Paul Price is a wonderful 79 y.o. male who has been referred to [place] by Dr Shelia Media for evaluation and management of anemia. The pt reports that he is doing well overall.    The pt reports that he does not feel differently than he did 6 months ago except for having improved energy. He has been taking iron pills for 3 weeks and has been feeling a significant boost of energy since. He has routinely done yardwork for hours, which is abnormal for him. Pt takes one iron pill per day with orange juice and has not noticed any stomach upset. He has not had a repeat iron test since Febuary. Pt's stools have been darker since beginning iron but they have not been black. Pt has not noticed any bloody stools but had a stool test about a month ago, which did find blood in his stool.   Pt does not currently have a GI but Dr. Arta Silence performed his colonoscopy and Dr. Ronnette Juniper performed his endoscopy, both in 2018. He has been taking Zarelto 20mg  daily for his Atrial Fibrillation for 3 years. He has been taking Allopurinol for his Gout, which has been severe in the past but is well controlled now. He has been on that for years. He is currently taking Lasix and is no longer taking Amlodipine. Pt reports that he maintains a balanced diet, is becoming more active and has lost 6-7 lbs in the last few weeks. Dr. Emelia Loron is his Nephrologist. Pt experiences frequent urination and believes that he has an enlarged prostate. He is not currently seeing a Urologist.   Pt has had Upper Endoscopy completed on 09/08/2016 with results revealing "- Normal esophagus. - Z-line regular, 40 cm from the incisors. - Erythematous mucosa in the  antrum. Biopsy is contraindicated. - Normal examined duodenum. - No specimens collected. - Scope induced trauma in duodenal bulb causing minimal self limited oozing."  Pt has had Colonoscopy completed on 09/09/2016 with results revealing "- Non-thrombosed external hemorrhoids found on perianal exam. - Internal hemorrhoids. - Diverticulosis in the sigmoid colon. - No source of melena seen; wonder if it is from the gastritis seen on prior endoscopy in setting of anticoagulation."  Most recent lab results (08/28/2018) of CBC is as follows: WBC at 6.8K, RBC at 3.75, Hgb at 9.5, HCT at 31.9, PLTs at 222K, MCV at 85.1, MCH at 25.3, MCHC at 29.8, RDW at 16.8, MPV at 12.5, Neutro Rel at 70.8, Lymphs Rel at 18.1, Monocytes Rel at 7.3, Eosinophils Rel at 3.2, Basophils Rel at 0.6.   On review of systems, pt reports frequent urination, more energy and denies constipation, bowel movement changes, bloody/ black stools and any other symptoms.   On PMHx the pt reports CKD, HTN, Afib, Gout, BPH  INTERVAL HISTORY:   Paul Price is a wonderful 79 y.o. male who is here for evaluation and management of anemia.The patient's last visit with [place] was on 01/15/2019. The pt reports that he is doing well overall.  The pt reports that he has noticed increased fatigue and is experiencing balance issues. He notes that his Hgb was 7.8 on 07/21/19 with Dr. Marval Regal. Pt has not received Aranesp yet. Pt has a scheduled  follow-up with Dr. Marval Regal next week. He received Feraheme on 06/30/19 and 07/07/2019 and had no issues tolerating IV Iron. Pt has also continued taking PO Iron. Pt has had black stools but denies any bloody stools. He is taking Xarelto as prescribed.   Lab results today (08/12/19) of CBC w/diff and CMP is as follows: all values are WNL except for RBC at 2.59, Hgb at 8.2, HCT at 27.0, MCV at 104.2, RDW at 16.5, PLT at 141K, Chloride at 112, CO2 at 20, Glucose at 109, BUN at 48, Creatinine at 3.14, Albumin at  3.4, GFR Est Non Af Am at 18. 08/12/2019 Ferritin at 85 08/12/2019 Vitamin B12 at 985 08/12/2019 Iron Panel is as follows: Iron at 59, TIBC at 258, Sat Ratios at 23, UIBC at 198   On review of systems, pt reports bruising, black stools, fatigue, imbalance and denies nose bleeds, gum bleeds, bloody stools and any other symptoms.   MEDICAL HISTORY:  Past Medical History:  Diagnosis Date  . Arthritis    "shoulders" (09/07/2016)  . Atrial fibrillation ([place])   . BPH (benign prostatic hyperplasia)   . Chronic kidney disease (CKD), stage III (moderate)   . Coronary artery disease   . DDD (degenerative disc disease), cervical   . DVT (deep venous thrombosis) ([place]) 12/2013   LLE  . GERD (gastroesophageal reflux disease)   . Gout   . High cholesterol   . History of blood transfusion 09/06/2016   2 u PRBC  . History of scarlet fever 1940s  . History of stress test 06/2011   No significant ischemia, this is a low risk scan. Clinical correlation recommended Abnormal myocardial perfusion study.  Marland Kitchen Hx of echocardiogram 05/2009   EF 40-45%, he did have mild annular calcification with mild-to-moderate MR and mild TR as well as aortic valve sclerosis. Estimated RV systolic pressure was 21 mm.  . Hypertension   . Iron deficiency anemia   . Kidney failure   . 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 Neck pain    "not chronic" (09/07/2016)  . Obesity   . OSA on CPAP   . RBBB   . Renal insufficiency   . Right renal mass   . Spinal stenosis of lumbar region     SURGICAL HISTORY: Past Surgical History:  Procedure Laterality Date  . APPENDECTOMY    . CARDIAC CATHETERIZATION  2009   he was found to have a atretic LIMA graft to his LAD and had a patent vein graft supplying his diagonal vessel. At that time, he had 70% narrowing in his LAD beyond a diagonal vessel which was initially treated medically.  . COLONOSCOPY WITH PROPOFOL Left 09/09/2016   Procedure: COLONOSCOPY WITH PROPOFOL;   Surgeon: Arta Silence, MD;  Location: [facility];  Service: Endoscopy;  Laterality: Left;  . CORONARY ANGIOPLASTY WITH STENT PLACEMENT  April 2012   LAD DES  . CORONARY ARTERY BYPASS GRAFT  1992   with LIMA to the LAD and diagonal.  . CYSTOSCOPY N/A 03/31/2019   Procedure: CYSTOSCOPY FLEXIBLE;  Surgeon: Lucas Mallow, MD;  Location: [facility];  Service: Urology;  Laterality: N/A;  . CYSTOSCOPY WITH URETHRAL DILATATION N/A 05/21/2019   Procedure: CYSTOSCOPY WITH URETHRAL BALLOON DILATATION;  Surgeon: Lucas Mallow, MD;  Location: [facility];  Service: Urology;  Laterality: N/A;  . ESOPHAGOGASTRODUODENOSCOPY (EGD) WITH PROPOFOL Left 09/08/2016   Procedure: ESOPHAGOGASTRODUODENOSCOPY (EGD) WITH PROPOFOL;  Surgeon: Ronnette Juniper, MD;  Location: [facility];  Service: Gastroenterology;  Laterality: Left;  . INGUINAL HERNIA REPAIR     "don't remeimber which side"  . LAPAROSCOPIC NEPHRECTOMY Right 03/31/2019   Procedure: RIGHT  HAND ASSIST LAPAROSCOPIC NEPHRECTOMY;  Surgeon: Lucas Mallow, MD;  Location: [facility];  Service: Urology;  Laterality: Right;  . PACEMAKER IMPLANT N/A 12/25/2016   Procedure: PACEMAKER IMPLANT;  Surgeon: Deboraha Sprang, MD;  Location: [facility];  Service: Cardiovascular;  Laterality: N/A;  . TONSILLECTOMY      SOCIAL HISTORY: Social History   Socioeconomic History  . Marital status: Married    Spouse name: Not on file  . Number of children: Not on file  . Years of education: Not on file  . Highest education level: Not on file  Occupational History  . Not on file  Tobacco Use  . Smoking status: Former Smoker    Packs/day: 1.00    Years: 30.00    Pack years: 30.00    Types: Cigarettes  . Smokeless tobacco: Never Used  . Tobacco comment: quit ~ 1985  Vaping Use  . Vaping Use: Never used  Substance and Sexual Activity  . Alcohol use: Yes    Alcohol/week: 0.0 standard drinks    Comment: rare beer  . Drug use: No  . Sexual activity: Not on  file  Other Topics Concern  . Not on file  Social History Narrative  . Not on file   Social Determinants of Health   Financial Resource Strain:   . Difficulty of Paying Living Expenses:   Food Insecurity:   . Worried About Charity fundraiser in the Last Year:   . Arboriculturist in the Last Year:   Transportation Needs:   . Film/video editor (Medical):   Marland Kitchen Lack of Transportation (Non-Medical):   Physical Activity:   . Days of Exercise per Week:   . Minutes of Exercise per Session:   Stress:   . Feeling of Stress :   Social Connections:   . Frequency of Communication with Friends and Family:   . Frequency of Social Gatherings with Friends and Family:   . Attends Religious Services:   . Active Member of Clubs or Organizations:   . Attends Archivist Meetings:   Marland Kitchen Marital Status:   Intimate Partner Violence:   . Fear of Current or Ex-Partner:   . Emotionally Abused:   Marland Kitchen Physically Abused:   . Sexually Abused:     FAMILY HISTORY: Family History  Problem Relation Age of Onset  . Heart disease Father     ALLERGIES:  is allergic to nitrostat [nitroglycerin].  MEDICATIONS:  Current Outpatient Medications  Medication Sig Dispense Refill  . acetaminophen (TYLENOL) 500 MG tablet Take 1,000 mg by mouth every 6 (six) hours as needed for moderate pain.     Marland Kitchen allopurinol (ZYLOPRIM) 300 MG tablet Take 300 mg by mouth daily.    Marland Kitchen amLODipine (NORVASC) 5 MG tablet Take 7.5 mg by mouth daily.    Marland Kitchen atorvastatin (LIPITOR) 20 MG tablet TAKE 1 TABLET BY MOUTH DAILY (Patient taking differently: Take 20 mg by mouth daily. ) 90 tablet 2  . b complex vitamins tablet Take 1 tablet by mouth daily.    . fenofibrate micronized (LOFIBRA) 134 MG capsule Take 134 mg by mouth daily before breakfast.    . ferrous sulfate 325 (65 FE) MG tablet Take 325 mg by mouth daily with breakfast.    . furosemide (LASIX) 20 MG  tablet Take 10-20 mg by mouth See admin instructions. 20 mg on even  days, 10 mg on odd days    . metoprolol succinate (TOPROL-XL) 25 MG 24 hr tablet TAKE 1 TABLET BY MOUTH DAILY (Patient taking differently: Take 25 mg by mouth daily. ) 90 tablet 3  . omeprazole (PRILOSEC) 20 MG capsule Take 20 mg by mouth at bedtime.     . Rivaroxaban (XARELTO) 15 MG TABS tablet Take 1 tablet (15 mg total) by mouth daily with supper. 30 tablet 1  . Tamsulosin HCl (FLOMAX) 0.4 MG CAPS Take 1 capsule (0.4 mg total) by mouth daily. 30 capsule 0   No current facility-administered medications for this visit.    REVIEW OF SYSTEMS:   A 10+ POINT REVIEW OF SYSTEMS WAS OBTAINED including neurology, dermatology, psychiatry, cardiac, respiratory, lymph, extremities, GI, GU, Musculoskeletal, constitutional, breasts, reproductive, HEENT.  All pertinent positives are noted in the HPI.  All others are negative.   PHYSICAL EXAMINATION: ECOG PERFORMANCE STATUS: 1 - Symptomatic but completely ambulatory  . Vitals:   08/12/19 1043  BP: (!) 145/72  Pulse: 69  Resp: 17  Temp: (!) 97.5 F (36.4 C)  SpO2: 100%   Filed Weights   08/12/19 1043  Weight: 182 lb 11.2 oz (82.9 kg)   .Body mass index is 29.49 kg/m.   Exam was given in a chair   GENERAL:alert, in no acute distress and comfortable SKIN: no acute rashes, no significant lesions EYES: conjunctiva are pink and non-injected, sclera anicteric OROPHARYNX: MMM, no exudates, no oropharyngeal erythema or ulceration NECK: supple, no JVD LYMPH:  no palpable lymphadenopathy in the cervical, axillary or inguinal regions LUNGS: clear to auscultation b/l with normal respiratory effort HEART: regular rate & rhythm ABDOMEN:  normoactive bowel sounds , non tender, not distended. No palpable hepatosplenomegaly.  Extremity: no pedal edema PSYCH: alert & oriented x 3 with fluent speech NEURO: no focal motor/sensory deficits  LABORATORY DATA:  I have reviewed the data as listed  . CBC Latest Ref Rng & Units 08/12/2019 05/14/2019 04/04/2019    WBC 4.0 - 10.5 K/uL 7.1 7.7 8.5  Hemoglobin 13.0 - 17.0 g/dL 8.2(L) 10.0(L) 9.6(L)  Hematocrit 39 - 52 % 27.0(L) 32.1(L) 30.0(L)  Platelets 150 - 400 K/uL 141(L) 220 119(L)   . CBC    Component Value Date/Time   WBC 7.1 08/12/2019 0959   RBC 2.59 (L) 08/12/2019 0959   HGB 8.2 (L) 08/12/2019 0959   HCT 27.0 (L) 08/12/2019 0959   HCT 39.9 10/01/2018 1130   PLT 141 (L) 08/12/2019 0959   MCV 104.2 (H) 08/12/2019 0959   MCH 31.7 08/12/2019 0959   MCHC 30.4 08/12/2019 0959   RDW 16.5 (H) 08/12/2019 0959   LYMPHSABS 1.0 08/12/2019 0959   MONOABS 0.3 08/12/2019 0959   EOSABS 0.2 08/12/2019 0959   BASOSABS 0.0 08/12/2019 0959    . CMP Latest Ref Rng & Units 08/12/2019 05/14/2019 04/04/2019  Glucose 70 - 99 mg/dL 109(H) 137(H) 126(H)  BUN 8 - 23 mg/dL 48(H) 61(H) 46(H)  Creatinine 0.61 - 1.24 mg/dL 3.14(HH) 3.47(H) 3.45(H)  Sodium 135 - 145 mmol/L 139 138 134(L)  Potassium 3.5 - 5.1 mmol/L 4.2 3.8 3.9  Chloride 98 - 111 mmol/L 112(H) 106 106  CO2 22 - 32 mmol/L 20(L) 21(L) 18(L)  Calcium 8.9 - 10.3 mg/dL 9.5 8.9 8.2(L)  Total Protein 6.5 - 8.1 g/dL 6.7 - -  Total Bilirubin 0.3 - 1.2 mg/dL 0.3 - -  Alkaline Phos 38 -  126 U/L 124 - -  AST 15 - 41 U/L 21 - -  ALT 0 - 44 U/L 13 - -   . Lab Results  Component Value Date   IRON 59 08/12/2019   TIBC 258 08/12/2019   IRONPCTSAT 23 08/12/2019   (Iron and TIBC)  Lab Results  Component Value Date   FERRITIN 85 08/12/2019     09/09/2016 Colonoscopy    09/08/2016 Upper Endoscopy    RADIOGRAPHIC STUDIES: I have personally reviewed the radiological images as listed and agreed with the findings in the report. CUP PACEART REMOTE DEVICE CHECK  Result Date: 07/25/2019 Scheduled remote reviewed. Normal device function.  1 NSVT 9 beats 190's. Next remote 91 days.   ASSESSMENT & PLAN:   1) Normocytic Anemia - likely related to CKD and concern for GI bleeding  PLAN: -Discussed pt labwork today, 08/12/19; Hgb is down, kidney  numbers are elevated, but stable, Ferritin is low, Vitamin B12 is well-replaced, Iron Panel shows all values are WNL. -Advised pt that anemia is caused by CKD, iron deficiency, and likely GI bleeding.  -Advised pt that CKD causes an underproduction of Erythropoietin, which is necessary for RBC formation.  -Will consider ESAs if Hgb <9 despite well-replaced iron. Goal Ferritin >= 250 -Will give pt IV Iron based on low Ferritin. Recommend pt discontinue PO Iron so that we can ascertain if there is any GI bleeding.  -Discussed performing a BM Bx to r/o a primary bone marrow disorder causing anemia - pt prefers to hold off at this time.  -Advised pt that low appetite, rapid weight loss, leg swelling, or GFR <15 would begin considerations for dialysis.  -Will discuss Aranesp injections with Dr. Marval Regal -Continue B-complex vitamin daily  -Will give IV Injectafer weekly x2 -Will see back in 2 months with labs    FOLLOW UP: RTC with Dr Irene Limbo with labs in 2 months  IV Injectafer weekly x 2 doses   The total time spent in the appt was 20 minutes and more than 50% was on counseling and direct patient cares.  All of the patient's questions were answered with apparent satisfaction. The patient knows to call the clinic with any problems, questions or concerns.    Sullivan Lone MD [place] AAHIVMS [facility] Hematology/Oncology Physician [facility]  (Office):       [phone] (Work cell):  [phone] (Fax):           [phone]  08/12/2019 2:23 PM  I, Yevette Edwards, am acting as a scribe for Dr. Sullivan Lone.   .I have reviewed the above documentation for accuracy and completeness, and I agree with the above. Brunetta Genera MD

## 2019-08-12 NOTE — Telephone Encounter (Signed)
Scheduled per 8/3 los. Printed avs and calendar for pt.

## 2019-08-18 ENCOUNTER — Telehealth: Payer: Self-pay | Admitting: Hematology

## 2019-08-18 DIAGNOSIS — N189 Chronic kidney disease, unspecified: Secondary | ICD-10-CM | POA: Insufficient documentation

## 2019-08-18 DIAGNOSIS — D631 Anemia in chronic kidney disease: Secondary | ICD-10-CM | POA: Insufficient documentation

## 2019-08-18 NOTE — Telephone Encounter (Signed)
Called patient regarding voicemail that was left, patient has been notified of upcoming appointments.

## 2019-08-19 ENCOUNTER — Other Ambulatory Visit: Payer: Self-pay

## 2019-08-19 ENCOUNTER — Encounter: Payer: Self-pay | Admitting: Cardiovascular Disease

## 2019-08-19 ENCOUNTER — Ambulatory Visit: Payer: Medicare Other | Admitting: Cardiovascular Disease

## 2019-08-19 VITALS — BP 144/64 | HR 68 | Ht 66.0 in | Wt 183.0 lb

## 2019-08-19 DIAGNOSIS — G4733 Obstructive sleep apnea (adult) (pediatric): Secondary | ICD-10-CM

## 2019-08-19 DIAGNOSIS — N184 Chronic kidney disease, stage 4 (severe): Secondary | ICD-10-CM

## 2019-08-19 DIAGNOSIS — D509 Iron deficiency anemia, unspecified: Secondary | ICD-10-CM

## 2019-08-19 DIAGNOSIS — Z951 Presence of aortocoronary bypass graft: Secondary | ICD-10-CM | POA: Diagnosis not present

## 2019-08-19 DIAGNOSIS — I10 Essential (primary) hypertension: Secondary | ICD-10-CM | POA: Diagnosis not present

## 2019-08-19 DIAGNOSIS — I2510 Atherosclerotic heart disease of native coronary artery without angina pectoris: Secondary | ICD-10-CM | POA: Diagnosis not present

## 2019-08-19 DIAGNOSIS — Z9989 Dependence on other enabling machines and devices: Secondary | ICD-10-CM

## 2019-08-19 DIAGNOSIS — Z950 Presence of cardiac pacemaker: Secondary | ICD-10-CM | POA: Diagnosis not present

## 2019-08-19 NOTE — Progress Notes (Signed)
Patient ID: Paul Price, male   DOB: Aug 31, 1940, 79 y.o.   MRN: [id number]     Primary MD:  Dr. [name]  HPI: Paul Price is a 79 y.o. male who presents for a 7 month cardiology followup evaluation.   [name] has established CAD and in 1992 underwent CABG revascularization surgery LIMA to the LAD and diagonal. In 2009 he was found to have an atretic LIMA graft to the LAD and a patent vein graft supplying the diagonal vessel. He had 70% narrowing in the LAD beyond the diagonal was treated medically. In April 2012 due to progressive chest pain and scintigraphic evidence for ischemia in the mid distal LAD territory repeat catheterization was performed and he underwent successful stenting to his LAD beyond the diagonal with a 3.0x20 mm Promus DES stent postdilated 3.25 mm. Subsequent, he has remained active and has done well. His last stress test in 2013 showed an apical defect without ischemia.  Additional problems include obstructive sleep apnea on CPAP therapy, moderate obesity, hypertension, history of DVT, and hyperlipidemia.  He sees Dr. Shelia Media for primary care.  On November 19, 2012 he saw Paul Price.PAC for suspected atrial fibrillation and CHF after having been seen by his primary physician and noted some increasing shortness of breath with activity. A CardioNet monitor  demonstrated  a 4.3 second pause which led to a reduction in his  beta blocker dose  from Lopressor 75 mg twice a day to 25 twice a day. He saw Tarri Fuller on 12/02/2012 at which time he was in sinus rhythm but also was found to have probably 9 beat run of nonsustained VT on monitor which is he was maintained on low-dose beta blocker therapy. Subsequent cardiac monitoring did not show any further episodes of significant bradycardia but he did have one additional 9 beat run of nonsustained PSVT at a rate of 109 beats per minute on 12/17/12. . Laboratory done by Dr. Shelia Media on 11/27/2012 showed a potassium of 4.2. His  creatinine was 1.82. BUN 33.  An echo Doppler study done 12/09/2012 showed an estimated ejection fraction at 50-55%. Although no diagnostic regional wall motion abnormalities were definitively identified, this possibility was not completely excluded. There was grade 2 diastolic dysfunction. There is mild left atrial dilatation and mild mitral regurgitation.  A renal ultrasound has  demonstrate bilateral small renal cysts.    In 2015 he was found to have a DVT in his left leg and was started on eliquis by Dr. Shelia Media.  He was hospitalized on November 23 through 12/09/2013 and presented with fevers, and sudden onset of severe back pain.  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  Blood cultures were negative.   After 1 week in the hospital, he spent 3 weeks at rehabilitation.  He was treated with 4 weeks of antibiotic therapy and after his hospitalization.  He was followed by Dr. [name] from infectious disease.  He last saw him in March 2016 and att that time he was felt to be completely cleared cured.    He has renal insufficiency and has been followed by Dr. Gillermina Phy since he had developed acute on chronic kidney injury.  His serum creatinine peaked at 2.8.  Ultimately, this has improved.    When I  saw him in follow-up, he continued to note lower extremity edema. He was no longer anticoagulation but has been on Plavix and aspirin.  I scheduled him for follow-up lower extremity venous ultrasound  which showed resolution of his left distal femoral vein and popliteal vein thrombus . His blood pressure has been treated with Toprol-XL 50 mg losartan 100 mg furosemide 40 mg and amlodipine 10 mg.  He is on lipid lowering therapy with atorvastatin.  He takes allopurinol for gout.  There is history of GERD treated with ranitidine.    He admits to using his CPAP with 100% compliance.  He states he has a new machine.  However, recently he has been noticing frequent awakenings.  He denies  awareness of breakthrough snoring.    When I last saw him, he noticed a change in symptomatology with more progressive exertional dyspnea.  He denied any exertional chest pressure.  However, he has noticed some nonexertional constant chest wall ache.  His shortness of breath was occurring with less activity.  He is unaware of palpitations.  He denies presyncope or syncope.  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   An echo Doppler study on 04/28/2016 showed an EF of 50-55%.  There was grade 2 diastolic dysfunction.  Septal motion.  She showed paradoxical motion.  The left atrium was severely dilated.  There was mild MR.  The RV was mildly dilated, as was the right atrium.  There was mild palmar hypertension with PA pressure 33.  He underwent a nuclear perfusion study to further evaluate his symptoms.  This remained low risk.  Although calculated at 47% EF, visually it appeared to be 55%.  There was a small nonreversible defect in the mid anterior and apical location without ischemia.  He also underwent lower extremity arterial Doppler studies which showed normal ABIs bilaterally and there was no evidence for segmental lower extremity arterial disease. Creatinine was 1.76 on 04/14/2016; Hemoglobin A1c was increased at 6.7.  Lipid studies revealed a TC 128, triglyceride 152, LDL 66, and he has a low HDL level of 32.    He was hospitalized on July 31 through 08/10/2016 and was found to have atrial fibrillation with rapid ventricular response as well as acute renal failure.  His creatinine had risen to 2.9 and improved to 1.9.  With gentle hydration.  His losartan and spironolactone were held.  He was started on Xarelto at discharge.  On August 29 through 09/09/2016.  He was rehospitalized with GI bleed and acute blood loss anemia.  He underwent endoscopy and colonoscopy and GI recommended continuation of anticoagulation with  concomitant Protonix therapy.  He tells me he received 3 units of blood.  During his initial hospitalization on August 1.  An echo Doppler study showed an EF of 45-50% with diffuse hypocontractility.  There was aortic sclerosis without stenosis, mitral annular calcification, and mild LA dilation.  He continues to be on CPAP.   He was hospitalized in December 2018 for dyspnea on exertion accompanied with lightheadedness.  He was noted to have Mobitz type I heart block during his hospitalization and beta-blocker was discontinued due to suspicion of sick sinus syndrome.  He was ultimately evaluated by Dr. Caryl Comes and underwent pacemaker implantation on December 18.  Subsequently, he has not had any recurrent syncope or presyncope.  A pacemaker device check in March 2019 showed normal device function.  I last saw him in June 2019.  Since his pacemaker insertion he denied any recurrent episodes of dizziness.  His weakness has resolved and shortness of breath is improved.  He continues  to use CPAP with 100% compliance.  He is unaware of any atrial fibrillation.  He admits to occasional leg swelling.  He denies any chest pain.    He saw Dr. Adam Phenix on December 12, 2018 in follow-up of his pacemaker.  Apparently, for some time prior to that evaluation his amlodipine which had been 7.5 mg was discontinued by Dr. Shelia Media due to his concern according to the patient that his blood pressure was getting low.  When evaluated by Dr. Caryl Comes in December, blood pressure was elevated at 150/78.  He was advised to reinstitute amlodipine at 2.5 mg.  However, the patient has subsequently increase this to 5 mg due to continued blood pressure elevations at home with typical blood pressures greater than 846 systolically.  He has continued to be on anticoagulation therapy with Xarelto with his history of PAF and is on a reduced dose with his renal insufficiency.  He is followed by Dr. Azzie Roup for stage IV chronic kidney disease with  creatinine 2.39 in November 2020.  He denies any chest pain PND orthopnea.  Since I last saw him in January 2021, Mr. [name] has continued to do well.  Apparently his amlodipine dose was reduced from 5 down to 2.5 mg since his blood pressure has become somewhat low.  He has undergone supplemental iron injections by Dr. Irene Limbo for his anemia.  He continues to be without anginal symptomatology on amlodipine 2.5 mg, metoprolol succinate 25 mg daily and he continues to be on furosemide 20 mg.  He states his blood pressure at home typically is in the 120s to low 130s.  He recently underwent laboratory on August 12, 2019 which revealed hemoglobin of 8 2 hematocrit 27.0.  He had macrocytic indices.  B12 level was normal.  He has stage IV chronic kidney disease and creatinine had slightly improved to 3.14 from 3.47 in May 2021.  He is followed by Dr. Meredeth Ide He also had seen Dr. Donnetta Hutching.  He presents for evaluation  Past Medical History:  Diagnosis Date  . Arthritis    "shoulders" (09/07/2016)  . Atrial fibrillation ([place])   . BPH (benign prostatic hyperplasia)   . Chronic kidney disease (CKD), stage III (moderate)   . Coronary artery disease   . DDD (degenerative disc disease), cervical   . DVT (deep venous thrombosis) ([place]) 12/2013   LLE  . GERD (gastroesophageal reflux disease)   . Gout   . High cholesterol   . History of blood transfusion 09/06/2016   2 u PRBC  . History of scarlet fever 1940s  . History of stress test 06/2011   No significant ischemia, this is a low risk scan. Clinical correlation recommended Abnormal myocardial perfusion study.  Marland Kitchen Hx of echocardiogram 05/2009   EF 40-45%, he did have mild annular calcification with mild-to-moderate MR and mild TR as well as aortic valve sclerosis. Estimated RV systolic pressure was 21 mm.  . Hypertension   . Iron deficiency anemia   . Kidney failure   . 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 Neck pain    "not chronic" (09/07/2016)  .  Obesity   . OSA on CPAP   . RBBB   . Renal insufficiency   . Right renal mass   . Spinal stenosis of lumbar region     Past Surgical History:  Procedure Laterality Date  . APPENDECTOMY    . CARDIAC CATHETERIZATION  2009   he was found to have a atretic LIMA graft  to his LAD and had a patent vein graft supplying his diagonal vessel. At that time, he had 70% narrowing in his LAD beyond a diagonal vessel which was initially treated medically.  . COLONOSCOPY WITH PROPOFOL Left 09/09/2016   Procedure: COLONOSCOPY WITH PROPOFOL;  Surgeon: Arta Silence, MD;  Location: [facility];  Service: Endoscopy;  Laterality: Left;  . CORONARY ANGIOPLASTY WITH STENT PLACEMENT  April 2012   LAD DES  . CORONARY ARTERY BYPASS GRAFT  1992   with LIMA to the LAD and diagonal.  . CYSTOSCOPY N/A 03/31/2019   Procedure: CYSTOSCOPY FLEXIBLE;  Surgeon: Lucas Mallow, MD;  Location: [facility];  Service: Urology;  Laterality: N/A;  . CYSTOSCOPY WITH URETHRAL DILATATION N/A 05/21/2019   Procedure: CYSTOSCOPY WITH URETHRAL BALLOON DILATATION;  Surgeon: Lucas Mallow, MD;  Location: [facility];  Service: Urology;  Laterality: N/A;  . ESOPHAGOGASTRODUODENOSCOPY (EGD) WITH PROPOFOL Left 09/08/2016   Procedure: ESOPHAGOGASTRODUODENOSCOPY (EGD) WITH PROPOFOL;  Surgeon: Ronnette Juniper, MD;  Location: [facility];  Service: Gastroenterology;  Laterality: Left;  . INGUINAL HERNIA REPAIR     "don't remeimber which side"  . LAPAROSCOPIC NEPHRECTOMY Right 03/31/2019   Procedure: RIGHT  HAND ASSIST LAPAROSCOPIC NEPHRECTOMY;  Surgeon: Lucas Mallow, MD;  Location: [facility];  Service: Urology;  Laterality: Right;  . PACEMAKER IMPLANT N/A 12/25/2016   Procedure: PACEMAKER IMPLANT;  Surgeon: Deboraha Sprang, MD;  Location: [facility];  Service: Cardiovascular;  Laterality: N/A;  . TONSILLECTOMY      Allergies  Allergen Reactions  . Nitrostat [Nitroglycerin] Other (See Comments)    Causes blood pressure to "bottom out"     Current Outpatient Medications  Medication Sig Dispense Refill  . acetaminophen (TYLENOL) 500 MG tablet Take 1,000 mg by mouth every 6 (six) hours as needed for moderate pain.     Marland Kitchen allopurinol (ZYLOPRIM) 300 MG tablet Take 300 mg by mouth daily.    Marland Kitchen amLODipine (NORVASC) 5 MG tablet Take 2.5 mg by mouth daily.     Marland Kitchen atorvastatin (LIPITOR) 20 MG tablet TAKE 1 TABLET BY MOUTH DAILY (Patient taking differently: Take 20 mg by mouth daily. ) 90 tablet 2  . b complex vitamins tablet Take 1 tablet by mouth daily.    . fenofibrate micronized (LOFIBRA) 134 MG capsule Take 134 mg by mouth daily before breakfast.    . furosemide (LASIX) 20 MG tablet Take 10-20 mg by mouth See admin instructions. 20 mg on even days, 10 mg on odd days    . metoprolol succinate (TOPROL-XL) 25 MG 24 hr tablet TAKE 1 TABLET BY MOUTH DAILY (Patient taking differently: Take 25 mg by mouth daily. ) 90 tablet 3  . omeprazole (PRILOSEC) 20 MG capsule Take 20 mg by mouth at bedtime.     . Rivaroxaban (XARELTO) 15 MG TABS tablet Take 1 tablet (15 mg total) by mouth daily with supper. 30 tablet 1  . Tamsulosin HCl (FLOMAX) 0.4 MG CAPS Take 1 capsule (0.4 mg total) by mouth daily. 30 capsule 0   No current facility-administered medications for this visit.    Social History   Socioeconomic History  . Marital status: Married    Spouse name: Not on file  . Number of children: Not on file  . Years of education: Not on file  . Highest education level: Not on file  Occupational History  . Not on file  Tobacco Use  . Smoking status: Former Smoker    Packs/day: 1.00  Years: 30.00    Pack years: 30.00    Types: Cigarettes  . Smokeless tobacco: Never Used  . Tobacco comment: quit ~ 1985  Vaping Use  . Vaping Use: Never used  Substance and Sexual Activity  . Alcohol use: Yes    Alcohol/week: 0.0 standard drinks    Comment: rare beer  . Drug use: No  . Sexual activity: Not on file  Other Topics Concern  . Not on file   Social History Narrative  . Not on file   Social Determinants of Health   Financial Resource Strain:   . Difficulty of Paying Living Expenses:   Food Insecurity:   . Worried About Charity fundraiser in the Last Year:   . Arboriculturist in the Last Year:   Transportation Needs:   . Film/video editor (Medical):   Marland Kitchen Lack of Transportation (Non-Medical):   Physical Activity:   . Days of Exercise per Week:   . Minutes of Exercise per Session:   Stress:   . Feeling of Stress :   Social Connections:   . Frequency of Communication with Friends and Family:   . Frequency of Social Gatherings with Friends and Family:   . Attends Religious Services:   . Active Member of Clubs or Organizations:   . Attends Archivist Meetings:   Marland Kitchen Marital Status:   Intimate Partner Violence:   . Fear of Current or Ex-Partner:   . Emotionally Abused:   Marland Kitchen Physically Abused:   . Sexually Abused:     Socially he is married, has 3 children 7 grandchildren. He does not use tobacco or alcohol.  ROS General: Negative; No fevers, chills, or night sweats; Moderate obesity HEENT: Negative; No changes in vision or hearing, sinus congestion, difficulty swallowing Pulmonary: Negative; No cough, wheezing, shortness of breath, hemoptysis Cardiovascular: Negative; No chest pain, presyncope, syncope, palpatations GI: Recent GI bleed, status post endoscopy and colonoscopy without definitive abnormality. GU: Stage IV chronic kidney disease Musculoskeletal: History of paraspinal myositis  Hematologic/Oncology: Negative; no easy bruising, bleeding Endocrine: Negative; no heat/cold intolerance; no diabetes Neuro: Occasional paresthesias; no changes in balance, headaches Skin: Negative; No rashes or skin lesions Psychiatric: Negative; No behavioral problems, depression Sleep: Positive for sleep apnea on CPAP; No snoring, daytime sleepiness, hypersomnolence, bruxism, restless legs, hypnogognic  hallucinations, no cataplexy Other comprehensive 14 point system review is negative.   PE BP (!) 144/64   Pulse 68   Ht '5\' 6"'$  (1.676 m)   Wt 183 lb (83 kg)   BMI 29.54 kg/m    Repeat blood pressure by me was 140/64.  He states he did not take his medicine this morning  Wt Readings from Last 3 Encounters:  08/19/19 183 lb (83 kg)  08/12/19 182 lb 11.2 oz (82.9 kg)  06/30/19 179 lb (81.2 kg)   General: Alert, oriented, no distress.  Skin: normal turgor, no rashes, warm and dry HEENT: Normocephalic, atraumatic. Pupils equal round and reactive to light; sclera anicteric; extraocular muscles intact;  Nose without nasal septal hypertrophy Mouth/Parynx benign; Mallinpatti scale 3 Neck: No JVD, no carotid bruits; normal carotid upstroke Lungs: clear to ausculatation and percussion; no wheezing or rales Chest wall: without tenderness to palpitation Heart: PMI not displaced, RRR, s1 s2 normal, 1/6 systolic murmur, no diastolic murmur, no rubs, gallops, thrills, or heaves Abdomen: soft, nontender; no hepatosplenomehaly, BS+; abdominal aorta nontender and not dilated by palpation. Back: no CVA tenderness Pulses 2+ Musculoskeletal: full range of  motion, normal strength, no joint deformities Extremities: no clubbing cyanosis or edema, Homan's sign negative  Neurologic: grossly nonfocal; Cranial nerves grossly wnl Psychologic: Normal mood and affect   ECG (independently read by me): Atrial sensed, ventricular paced rhythm at 68 bpm  January 13, 2019 ECG (independently read by me): Ventricular paced rhythm at 68 bpm.  June 2019 ECG (independently read by me): Sinus rhythm appropriate atrial sensing and atrial pacing,  right bundle branch block, left axis deviation  September 2018 ECG (independently read by me): normal sinus rhythm at 60 bpm.  Incomplete right bundle branch block.  Mild inferolateral ST changes.  May 2018 ECG (independently read by me): sinus bradycardia 58 bpm.  Left axis  deviation.  Right bundle branch block with repolarization changes.  QTc interval 471 ms.  April 2018 ECG (independently read by me):NSR At 61, left axis deviation.  Right bundle branch block.  September 2017 ECG (independently read by me): Normal sinus rhythm with mild sinus arrhythmia.  First degree AV block with a PR interval at 212 ms.  Right bundle-branch block.  March 2017 ECG (independently read by me): Normal sinus rhythm at 61 bpm.  Right bundle branch block with repolarization changes.  QTc interval 477 ms.  PR interval 204 ms.  October 2016 ECG (independently read by me): Normal sinus rhythm at 65 bpm.  Right bundle branch block.  First degree AV block.  June 2015 ECG (independently read by me): Normal sinus rhythm at 61 beats per minute.  Right bundle branch block.  Mild voltage criteria for LVH in aVL  ECG (independently read by me): Sinus rhythm at 60 beats per minute. Right bundle branch block; PAC, inferolateral T abnormalities.  LABS: I personally reviewed the lab work from November 15, 2018.  BMP Latest Ref Rng & Units 08/12/2019 05/14/2019 04/04/2019  Glucose 70 - 99 mg/dL 109(H) 137(H) 126(H)  BUN 8 - 23 mg/dL 48(H) 61(H) 46(H)  Creatinine 0.61 - 1.24 mg/dL 3.14(HH) 3.47(H) 3.45(H)  BUN/Creat Ratio 10 - 24 - - -  Sodium 135 - 145 mmol/L 139 138 134(L)  Potassium 3.5 - 5.1 mmol/L 4.2 3.8 3.9  Chloride 98 - 111 mmol/L 112(H) 106 106  CO2 22 - 32 mmol/L 20(L) 21(L) 18(L)  Calcium 8.9 - 10.3 mg/dL 9.5 8.9 8.2(L)   Hepatic Function Latest Ref Rng & Units 08/12/2019 01/15/2019 10/01/2018  Total Protein 6.5 - 8.1 g/dL 6.7 7.1 7.6  Albumin 3.5 - 5.0 g/dL 3.4(L) 3.8 4.0  AST 15 - 41 U/L _0 ALT 0 - 44 U/L _1 Alk Phosphatase 38 - 126 U/L 124 70 76  Total Bilirubin 0.3 - 1.2 mg/dL 0.3 0.4 0.4   CBC Latest Ref Rng & Units 08/12/2019 05/14/2019 04/04/2019  WBC 4.0 - 10.5 K/uL 7.1 7.7 8.5  Hemoglobin 13.0 - 17.0 g/dL 8.2(L) 10.0(L) 9.6(L)  Hematocrit 39 - 52 % 27.0(L) 32.1(L)  30.0(L)  Platelets 150 - 400 K/uL 141(L) 220 119(L)   Lab Results  Component Value Date   MCV 104.2 (H) 08/12/2019   MCV 91.5 05/14/2019   MCV 94.0 04/04/2019   Lab Results  Component Value Date   TSH 2.149 10/01/2018   No results found for: HGBA1C   Lipid Panel     Component Value Date/Time   CHOL 104 12/22/2016 0535   TRIG 91 12/22/2016 0535   HDL 29 (L) 12/22/2016 0535   CHOLHDL 3.6 12/22/2016 0535   VLDL 18 12/22/2016 0535  LDLCALC 57 12/22/2016 0535     BNP No results found for: PROBNP  Lipid Panel     Component Value Date/Time   CHOL 104 12/22/2016 0535   Lipid Panel     Component Value Date/Time   CHOL 104 12/22/2016 0535   TRIG 91 12/22/2016 0535   HDL 29 (L) 12/22/2016 0535   CHOLHDL 3.6 12/22/2016 0535   VLDL 18 12/22/2016 0535   LDLCALC 57 12/22/2016 0535    RADIOLOGY: No results found.  IMPRESSION:  1. Essential hypertension   2. CAD in native artery   3. Hx of CABG   4. Pacemaker   5. OSA on CPAP   6. Chronic kidney disease, stage IV (severe) ([place])   7. Iron deficiency anemia, unspecified iron deficiency anemia type     ASSESSMENT AND PLAN: [name] is an 79 year old Caucasian male who is 29 years status post CABG revascularization surgery in 1992 and 9 years since a stent was placed in the LAD beyond this diagonal vessel in 2012. He has a documented atretic LIMA graft and a patent vein graft which supplies this diagonal graft.   His prior echo Doppler study in 2013 showed an ejection fraction of 50-55% and probable grade 2 diastolic dysfunction. He did have mild left atrial dilatation and mild mitral regurgitation.  On his echo Doppler study in April 2018 Ejection fraction remained stable at 46-50%; grade 2 diastolic dysfunction, and there was evidence for  severe left atrial enlargement with mild right atrial enlargement.  Mild pulmonary hypertension.  His nuclear perfusion study remained in the low risk with only a small defect in the  mid apical and anterior wall consistent with prior infarction without associated ischemia.  He has a history of PAF which he was started on anticoagulation therapy and also has developed renal insufficiency.  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 Caryl Comes was continue to follow him from a pacemaker standpoint.  He has developed stage IV chronic kidney disease.  His recent renal function is slightly improved with a creatinine of 3.14, improved from 3.47 in May 2021.  He is now on amlodipine at a reduced dose of 2.5 mg in addition to metoprolol succinate 25 mg daily and furosemide 20 mg on even days and 10 mg on odd days.  Pressure was slightly increased today but he states he did not take his medication.  He will be having a follow-up evaluation with his renal physician as well as Dr. [name].  His ECG shows 100% ventricular pacing with atrial sensing.  He continues to be on atorvastatin 20 mg for hyperlipidemia.  LDL cholesterol in November 2020 was 59.  He has had purposeful weight loss and feels improved.  He is not having any anginal symptomatology or awareness of recurrent atrial fibrillation.  He continues to be on Xarelto for anticoagulation.  He admits to 1% compliance with CPAP therapy and is sleeping well and denies any residual daytime sleepiness or awareness of breakthrough snoring.  An Epworth Sleepiness Scale score was calculated today and this endorsed at 9 arguing against residual daytime sleepiness.  He will continue current therapy.  I will see him in 6 months for follow-up Cardiologic evaluation or sooner as needed.    Troy Sine, MD, [facility]  08/19/2019 9:00 AM

## 2019-08-19 NOTE — Patient Instructions (Signed)

## 2019-08-25 DIAGNOSIS — N185 Chronic kidney disease, stage 5: Secondary | ICD-10-CM | POA: Diagnosis not present

## 2019-08-26 ENCOUNTER — Other Ambulatory Visit (HOSPITAL_COMMUNITY): Payer: Self-pay | Admitting: *Deleted

## 2019-08-27 ENCOUNTER — Other Ambulatory Visit: Payer: Self-pay

## 2019-08-27 ENCOUNTER — Ambulatory Visit (HOSPITAL_COMMUNITY)
Admission: RE | Admit: 2019-08-27 | Discharge: 2019-08-27 | Disposition: A | Discharge status: Home / Self Care | Payer: Medicare Other | Source: Ambulatory Visit | Attending: Nephrology | Admitting: Nephrology

## 2019-08-27 DIAGNOSIS — N184 Chronic kidney disease, stage 4 (severe): Secondary | ICD-10-CM | POA: Diagnosis not present

## 2019-08-27 DIAGNOSIS — D631 Anemia in chronic kidney disease: Secondary | ICD-10-CM | POA: Diagnosis not present

## 2019-08-27 LAB — PREPARE RBC (CROSSMATCH)

## 2019-08-27 MED ORDER — SODIUM CHLORIDE 0.9% IV SOLUTION: Freq: Once | INTRAVENOUS | Status: DC

## 2019-08-28 LAB — BPAM RBC
Blood Product Expiration Date: 202108242359
ISSUE DATE / TIME: 202108181011
Unit Type and Rh: 5100

## 2019-08-28 LAB — TYPE AND SCREEN
ABO/RH(D): O POS
Antibody Screen: NEGATIVE
Unit division: 0

## 2019-09-01 DIAGNOSIS — N185 Chronic kidney disease, stage 5: Secondary | ICD-10-CM | POA: Diagnosis not present

## 2019-09-01 DIAGNOSIS — D631 Anemia in chronic kidney disease: Secondary | ICD-10-CM | POA: Diagnosis not present

## 2019-09-01 DIAGNOSIS — I120 Hypertensive chronic kidney disease with stage 5 chronic kidney disease or end stage renal disease: Secondary | ICD-10-CM | POA: Diagnosis not present

## 2019-09-01 DIAGNOSIS — N179 Acute kidney failure, unspecified: Secondary | ICD-10-CM | POA: Diagnosis not present

## 2019-09-01 DIAGNOSIS — N2581 Secondary hyperparathyroidism of renal origin: Secondary | ICD-10-CM | POA: Diagnosis not present

## 2019-09-06 ENCOUNTER — Other Ambulatory Visit: Payer: Self-pay | Admitting: Cardiovascular Disease

## 2019-09-10 DIAGNOSIS — G4733 Obstructive sleep apnea (adult) (pediatric): Secondary | ICD-10-CM | POA: Diagnosis not present

## 2019-09-22 DIAGNOSIS — N185 Chronic kidney disease, stage 5: Secondary | ICD-10-CM | POA: Diagnosis not present

## 2019-09-24 ENCOUNTER — Other Ambulatory Visit (HOSPITAL_COMMUNITY): Payer: Self-pay | Admitting: *Deleted

## 2019-09-25 ENCOUNTER — Ambulatory Visit (HOSPITAL_COMMUNITY)
Admission: RE | Admit: 2019-09-25 | Discharge: 2019-09-25 | Disposition: A | Discharge status: Home / Self Care | Payer: Medicare Other | Source: Ambulatory Visit | Attending: Nephrology | Admitting: Nephrology

## 2019-09-25 ENCOUNTER — Other Ambulatory Visit: Payer: Self-pay

## 2019-09-25 DIAGNOSIS — D649 Anemia, unspecified: Secondary | ICD-10-CM | POA: Insufficient documentation

## 2019-09-25 LAB — PREPARE RBC (CROSSMATCH)

## 2019-09-25 MED ORDER — DIPHENHYDRAMINE HCL 25 MG PO CAPS
25.0000 mg | ORAL_CAPSULE | Freq: Once | ORAL | Status: AC
  Administered 2019-09-25: 25 mg via ORAL

## 2019-09-25 MED ORDER — ACETAMINOPHEN 325 MG PO TABS
650.0000 mg | ORAL_TABLET | Freq: Once | ORAL | Status: AC
  Administered 2019-09-25: 650 mg via ORAL

## 2019-09-25 MED ORDER — SODIUM CHLORIDE 0.9% IV SOLUTION: Freq: Once | INTRAVENOUS | Status: AC

## 2019-09-25 MED ORDER — DIPHENHYDRAMINE HCL 25 MG PO CAPS
ORAL_CAPSULE | ORAL | Status: AC
  Filled 2019-09-25: qty 1

## 2019-09-25 MED ORDER — ACETAMINOPHEN 325 MG PO TABS
ORAL_TABLET | ORAL | Status: AC
  Filled 2019-09-25: qty 2

## 2019-09-26 LAB — BPAM RBC
Blood Product Expiration Date: 202109202359
ISSUE DATE / TIME: 202109160951
Unit Type and Rh: 9500

## 2019-09-26 LAB — TYPE AND SCREEN
ABO/RH(D): O POS
Antibody Screen: NEGATIVE
Unit division: 0

## 2019-09-30 DIAGNOSIS — Z23 Encounter for immunization: Secondary | ICD-10-CM | POA: Diagnosis not present

## 2019-09-30 DIAGNOSIS — G4733 Obstructive sleep apnea (adult) (pediatric): Secondary | ICD-10-CM | POA: Diagnosis not present

## 2019-10-04 ENCOUNTER — Other Ambulatory Visit (HOSPITAL_COMMUNITY): Payer: Self-pay | Admitting: *Deleted

## 2019-10-04 NOTE — Discharge Instructions (Signed)
Ferumoxytol injection What is this medicine? FERUMOXYTOL is an iron complex. Iron is used to make healthy red blood cells, which carry oxygen and nutrients throughout the body. This medicine is used to treat iron deficiency anemia. This medicine may be used for other purposes; ask your health care provider or pharmacist if you have questions. COMMON BRAND NAME(S): Feraheme What should I tell my health care provider before I take this medicine? They need to know if you have any of these conditions:  anemia not caused by low iron levels  high levels of iron in the blood  magnetic resonance imaging (MRI) test scheduled  an unusual or allergic reaction to iron, other medicines, foods, dyes, or preservatives  pregnant or trying to get pregnant  breast-feeding How should I use this medicine? This medicine is for injection into a vein. It is given by a health care professional in a hospital or clinic setting. Talk to your pediatrician regarding the use of this medicine in children. Special care may be needed. Overdosage: If you think you have taken too much of this medicine contact a poison control center or emergency room at once. NOTE: This medicine is only for you. Do not share this medicine with others. What if I miss a dose? It is important not to miss your dose. Call your doctor or health care professional if you are unable to keep an appointment. What may interact with this medicine? This medicine may interact with the following medications:  other iron products This list may not describe all possible interactions. Give your health care provider a list of all the medicines, herbs, non-prescription drugs, or dietary supplements you use. Also tell them if you smoke, drink alcohol, or use illegal drugs. Some items may interact with your medicine. What should I watch for while using this medicine? Visit your doctor or healthcare professional regularly. Tell your doctor or healthcare  professional if your symptoms do not start to get better or if they get worse. You may need blood work done while you are taking this medicine. You may need to follow a special diet. Talk to your doctor. Foods that contain iron include: whole grains/cereals, dried fruits, beans, or peas, leafy green vegetables, and organ meats (liver, kidney). What side effects may I notice from receiving this medicine? Side effects that you should report to your doctor or health care professional as soon as possible:  allergic reactions like skin rash, itching or hives, swelling of the face, lips, or tongue  breathing problems  changes in blood pressure  feeling faint or lightheaded, falls  fever or chills  flushing, sweating, or hot feelings  swelling of the ankles or feet Side effects that usually do not require medical attention (report to your doctor or health care professional if they continue or are bothersome):  diarrhea  headache  nausea, vomiting  stomach pain This list may not describe all possible side effects. Call your doctor for medical advice about side effects. You may report side effects to FDA at 1-800-FDA-1088. Where should I keep my medicine? This drug is given in a hospital or clinic and will not be stored at home. NOTE: This sheet is a summary. It may not cover all possible information. If you have questions about this medicine, talk to your doctor, pharmacist, or health care provider.  2020 Elsevier/Gold Standard (2016-02-14 20:21:10) Epoetin Alfa injection What is this medicine? EPOETIN ALFA (e POE e tin AL fa) helps your body make more red blood cells. This   medicine is used to treat anemia caused by chronic kidney disease, cancer chemotherapy, or HIV-therapy. It may also be used before surgery if you have anemia. This medicine may be used for other purposes; ask your health care provider or pharmacist if you have questions. COMMON BRAND NAME(S): Epogen, Procrit,  Retacrit What should I tell my health care provider before I take this medicine? They need to know if you have any of these conditions:  cancer  heart disease  high blood pressure  history of blood clots  history of stroke  low levels of folate, iron, or vitamin B12 in the blood  seizures  an unusual or allergic reaction to erythropoietin, albumin, benzyl alcohol, hamster proteins, other medicines, foods, dyes, or preservatives  pregnant or trying to get pregnant  breast-feeding How should I use this medicine? This medicine is for injection into a vein or under the skin. It is usually given by a health care professional in a hospital or clinic setting. If you get this medicine at home, you will be taught how to prepare and give this medicine. Use exactly as directed. Take your medicine at regular intervals. Do not take your medicine more often than directed. It is important that you put your used needles and syringes in a special sharps container. Do not put them in a trash can. If you do not have a sharps container, call your pharmacist or healthcare provider to get one. A special MedGuide will be given to you by the pharmacist with each prescription and refill. Be sure to read this information carefully each time. Talk to your pediatrician regarding the use of this medicine in children. While this drug may be prescribed for selected conditions, precautions do apply. Overdosage: If you think you have taken too much of this medicine contact a poison control center or emergency room at once. NOTE: This medicine is only for you. Do not share this medicine with others. What if I miss a dose? If you miss a dose, take it as soon as you can. If it is almost time for your next dose, take only that dose. Do not take double or extra doses. What may interact with this medicine? Interactions have not been studied. This list may not describe all possible interactions. Give your health care  provider a list of all the medicines, herbs, non-prescription drugs, or dietary supplements you use. Also tell them if you smoke, drink alcohol, or use illegal drugs. Some items may interact with your medicine. What should I watch for while using this medicine? Your condition will be monitored carefully while you are receiving this medicine. You may need blood work done while you are taking this medicine. This medicine may cause a decrease in vitamin B6. You should make sure that you get enough vitamin B6 while you are taking this medicine. Discuss the foods you eat and the vitamins you take with your health care professional. What side effects may I notice from receiving this medicine? Side effects that you should report to your doctor or health care professional as soon as possible:  allergic reactions like skin rash, itching or hives, swelling of the face, lips, or tongue  seizures  signs and symptoms of a blood clot such as breathing problems; changes in vision; chest pain; severe, sudden headache; pain, swelling, warmth in the leg; trouble speaking; sudden numbness or weakness of the face, arm or leg  signs and symptoms of a stroke like changes in vision; confusion; trouble speaking or understanding;   severe headaches; sudden numbness or weakness of the face, arm or leg; trouble walking; dizziness; loss of balance or coordination Side effects that usually do not require medical attention (report to your doctor or health care professional if they continue or are bothersome):  chills  cough  dizziness  fever  headaches  joint pain  muscle cramps  muscle pain  nausea, vomiting  pain, redness, or irritation at site where injected This list may not describe all possible side effects. Call your doctor for medical advice about side effects. You may report side effects to FDA at 1-800-FDA-1088. Where should I keep my medicine? Keep out of the reach of children. Store in a  refrigerator between 2 and 8 degrees C (36 and 46 degrees F). Do not freeze or shake. Throw away any unused portion if using a single-dose vial. Multi-dose vials can be kept in the refrigerator for up to 21 days after the initial dose. Throw away unused medicine. NOTE: This sheet is a summary. It may not cover all possible information. If you have questions about this medicine, talk to your doctor, pharmacist, or health care provider.  2020 Elsevier/Gold Standard (2016-08-04 08:35:19)  

## 2019-10-07 ENCOUNTER — Encounter (HOSPITAL_COMMUNITY)
Admission: RE | Admit: 2019-10-07 | Discharge: 2019-10-07 | Disposition: A | Discharge status: Home / Self Care | Payer: Medicare Other | Source: Ambulatory Visit | Attending: Nephrology | Admitting: Nephrology

## 2019-10-07 ENCOUNTER — Other Ambulatory Visit: Payer: Self-pay

## 2019-10-07 VITALS — BP 144/62 | HR 65 | Temp 98.5°F | Resp 20

## 2019-10-07 DIAGNOSIS — D631 Anemia in chronic kidney disease: Secondary | ICD-10-CM | POA: Diagnosis not present

## 2019-10-07 DIAGNOSIS — N185 Chronic kidney disease, stage 5: Secondary | ICD-10-CM | POA: Diagnosis not present

## 2019-10-07 LAB — POCT HEMOGLOBIN-HEMACUE: Hemoglobin: 7.8 g/dL — ABNORMAL LOW (ref 13.0–17.0)

## 2019-10-07 MED ORDER — EPOETIN ALFA-EPBX 10000 UNIT/ML IJ SOLN: 20000.0000 [IU] | INTRAMUSCULAR | Status: DC

## 2019-10-07 MED ORDER — EPOETIN ALFA-EPBX 10000 UNIT/ML IJ SOLN
INTRAMUSCULAR | Status: AC
  Administered 2019-10-07: 20000 [IU] via SUBCUTANEOUS
  Filled 2019-10-07: qty 2

## 2019-10-07 MED ORDER — SODIUM CHLORIDE 0.9 % IV SOLN
510.0000 mg | INTRAVENOUS | Status: DC
  Administered 2019-10-07: 510 mg via INTRAVENOUS
  Filled 2019-10-07: qty 17

## 2019-10-10 DIAGNOSIS — G4733 Obstructive sleep apnea (adult) (pediatric): Secondary | ICD-10-CM | POA: Diagnosis not present

## 2019-10-20 ENCOUNTER — Inpatient Hospital Stay: Payer: Medicare Other | Attending: Hematology | Admitting: Hematology

## 2019-10-20 ENCOUNTER — Telehealth: Payer: Self-pay

## 2019-10-20 ENCOUNTER — Other Ambulatory Visit: Payer: Self-pay

## 2019-10-20 ENCOUNTER — Inpatient Hospital Stay: Payer: Medicare Other

## 2019-10-20 ENCOUNTER — Other Ambulatory Visit: Payer: Self-pay | Admitting: *Deleted

## 2019-10-20 VITALS — BP 137/62 | HR 66 | Temp 95.6°F | Resp 18 | Ht 66.0 in | Wt 189.7 lb

## 2019-10-20 DIAGNOSIS — D649 Anemia, unspecified: Secondary | ICD-10-CM | POA: Insufficient documentation

## 2019-10-20 DIAGNOSIS — I129 Hypertensive chronic kidney disease with stage 1 through stage 4 chronic kidney disease, or unspecified chronic kidney disease: Secondary | ICD-10-CM | POA: Diagnosis not present

## 2019-10-20 DIAGNOSIS — I4891 Unspecified atrial fibrillation: Secondary | ICD-10-CM | POA: Diagnosis not present

## 2019-10-20 DIAGNOSIS — D631 Anemia in chronic kidney disease: Secondary | ICD-10-CM

## 2019-10-20 DIAGNOSIS — Z87891 Personal history of nicotine dependence: Secondary | ICD-10-CM | POA: Insufficient documentation

## 2019-10-20 DIAGNOSIS — N189 Chronic kidney disease, unspecified: Secondary | ICD-10-CM | POA: Diagnosis not present

## 2019-10-20 DIAGNOSIS — D509 Iron deficiency anemia, unspecified: Secondary | ICD-10-CM

## 2019-10-20 DIAGNOSIS — N1832 Chronic kidney disease, stage 3b: Secondary | ICD-10-CM

## 2019-10-20 LAB — CBC WITH DIFFERENTIAL (CANCER CENTER ONLY)
Abs Immature Granulocytes: 0.03 10*3/uL (ref 0.00–0.07)
Basophils Absolute: 0 10*3/uL (ref 0.0–0.1)
Basophils Relative: 0 %
Eosinophils Absolute: 0.2 10*3/uL (ref 0.0–0.5)
Eosinophils Relative: 3 %
HCT: 26.2 % — ABNORMAL LOW (ref 39.0–52.0)
Hemoglobin: 7.7 g/dL — ABNORMAL LOW (ref 13.0–17.0)
Immature Granulocytes: 0 %
Lymphocytes Relative: 10 %
Lymphs Abs: 0.7 10*3/uL (ref 0.7–4.0)
MCH: 27.2 pg (ref 26.0–34.0)
MCHC: 29.4 g/dL — ABNORMAL LOW (ref 30.0–36.0)
MCV: 92.6 fL (ref 80.0–100.0)
Monocytes Absolute: 0.3 10*3/uL (ref 0.1–1.0)
Monocytes Relative: 5 %
Neutro Abs: 5.5 10*3/uL (ref 1.7–7.7)
Neutrophils Relative %: 82 %
Platelet Count: 146 10*3/uL — ABNORMAL LOW (ref 150–400)
RBC: 2.83 MIL/uL — ABNORMAL LOW (ref 4.22–5.81)
RDW: 20.8 % — ABNORMAL HIGH (ref 11.5–15.5)
WBC Count: 6.8 10*3/uL (ref 4.0–10.5)
nRBC: 0 % (ref 0.0–0.2)

## 2019-10-20 LAB — IRON AND TIBC
Iron: 26 ug/dL — ABNORMAL LOW (ref 42–163)
Saturation Ratios: 10 % — ABNORMAL LOW (ref 20–55)
TIBC: 273 ug/dL (ref 202–409)
UIBC: 246 ug/dL (ref 117–376)

## 2019-10-20 LAB — VITAMIN B12: Vitamin B-12: 931 pg/mL — ABNORMAL HIGH (ref 180–914)

## 2019-10-20 LAB — CMP (CANCER CENTER ONLY)
ALT: 12 U/L (ref 0–44)
AST: 18 U/L (ref 15–41)
Albumin: 3.2 g/dL — ABNORMAL LOW (ref 3.5–5.0)
Alkaline Phosphatase: 155 U/L — ABNORMAL HIGH (ref 38–126)
Anion gap: 8 (ref 5–15)
BUN: 39 mg/dL — ABNORMAL HIGH (ref 8–23)
CO2: 21 mmol/L — ABNORMAL LOW (ref 22–32)
Calcium: 9.3 mg/dL (ref 8.9–10.3)
Chloride: 113 mmol/L — ABNORMAL HIGH (ref 98–111)
Creatinine: 3.18 mg/dL (ref 0.61–1.24)
GFR, Estimated: 17 mL/min — ABNORMAL LOW (ref >60)
Glucose, Bld: 117 mg/dL — ABNORMAL HIGH (ref 70–99)
Potassium: 4 mmol/L (ref 3.5–5.1)
Sodium: 142 mmol/L (ref 135–145)
Total Bilirubin: 0.5 mg/dL (ref 0.3–1.2)
Total Protein: 6.2 g/dL — ABNORMAL LOW (ref 6.5–8.1)

## 2019-10-20 LAB — FERRITIN: Ferritin: 145 ng/mL (ref 24–336)

## 2019-10-20 LAB — SAMPLE TO BLOOD BANK

## 2019-10-20 NOTE — Telephone Encounter (Signed)
Critical Value: Creatinine 3.18 Notified: Inocencio Homes, Rn

## 2019-10-20 NOTE — Progress Notes (Signed)
HEMATOLOGY/ONCOLOGY CLINIC NOTE  Date of Service: 10/20/2019  Patient Care Team: [name], MD as PCP - General (Internal Medicine) Troy Sine, MD as PCP - Cardiology (Cardiology)  CHIEF COMPLAINTS/PURPOSE OF CONSULTATION:  Anemia  HISTORY OF PRESENTING ILLNESS:   Paul Price is a wonderful 79 y.o. male who has been referred to [place] by Dr Shelia Media for evaluation and management of anemia. The pt reports that he is doing well overall.    The pt reports that he does not feel differently than he did 6 months ago except for having improved energy. He has been taking iron pills for 3 weeks and has been feeling a significant boost of energy since. He has routinely done yardwork for hours, which is abnormal for him. Pt takes one iron pill per day with orange juice and has not noticed any stomach upset. He has not had a repeat iron test since Febuary. Pt's stools have been darker since beginning iron but they have not been black. Pt has not noticed any bloody stools but had a stool test about a month ago, which did find blood in his stool.   Pt does not currently have a GI but Dr. Arta Silence performed his colonoscopy and Dr. Ronnette Juniper performed his endoscopy, both in 2018. He has been taking Zarelto 20mg  daily for his Atrial Fibrillation for 3 years. He has been taking Allopurinol for his Gout, which has been severe in the past but is well controlled now. He has been on that for years. He is currently taking Lasix and is no longer taking Amlodipine. Pt reports that he maintains a balanced diet, is becoming more active and has lost 6-7 lbs in the last few weeks. Dr. Emelia Loron is his Nephrologist. Pt experiences frequent urination and believes that he has an enlarged prostate. He is not currently seeing a Urologist.   Pt has had Upper Endoscopy completed on 09/08/2016 with results revealing "- Normal esophagus. - Z-line regular, 40 cm from the incisors. - Erythematous mucosa in the  antrum. Biopsy is contraindicated. - Normal examined duodenum. - No specimens collected. - Scope induced trauma in duodenal bulb causing minimal self limited oozing."  Pt has had Colonoscopy completed on 09/09/2016 with results revealing "- Non-thrombosed external hemorrhoids found on perianal exam. - Internal hemorrhoids. - Diverticulosis in the sigmoid colon. - No source of melena seen; wonder if it is from the gastritis seen on prior endoscopy in setting of anticoagulation."  Most recent lab results (08/28/2018) of CBC is as follows: WBC at 6.8K, RBC at 3.75, Hgb at 9.5, HCT at 31.9, PLTs at 222K, MCV at 85.1, MCH at 25.3, MCHC at 29.8, RDW at 16.8, MPV at 12.5, Neutro Rel at 70.8, Lymphs Rel at 18.1, Monocytes Rel at 7.3, Eosinophils Rel at 3.2, Basophils Rel at 0.6.   On review of systems, pt reports frequent urination, more energy and denies constipation, bowel movement changes, bloody/ black stools and any other symptoms.   On PMHx the pt reports CKD, HTN, Afib, Gout, BPH  INTERVAL HISTORY:  Paul Price is a wonderful 79 y.o. male who is here for evaluation and management of anemia. The patient's last visit with [place] was on 08/12/2019. The pt reports that he is doing well overall.  The pt reports that he has some bilateral leg swelling and weakness. He is healing well after his recent kidney surgery. Pt continues 15 mg Xarelto, 20 mg Lasix, and a B-complex daily. Pt was started  on an ESA and Madilyn Hook about two weeks ago with Dr. Arty Baumgartner. He has a follow up with him later this week.  Lab results today (10/20/19) of CBC w/diff and CMP is as follows: all values are WNL except for RBC at 2.83, Hgb at 7.7, HCT at 26.2, MCH at 29.4, RDW at 20.8, PLT at 146K, Chloride at 113, CO2 at 21, Glucose at 117, BUN at 39, Creatinine at 3.18, Total Protein at 6.2, Albumin at 3.2, ALP at 155, GFR Est at 17. 10/20/2019 Ferritin at 145 10/20/2019 Vitamin B12 at 931 10/20/2019 Iron Panel is as follows: Iron  at 26, TIBC at 273, Sat Ratios at 10, UIBC at 246.  On review of systems, pt reports leg swelling, weakness, lightheadedness, dizziness, intermittent SOB and denies dysuria, bloody/black stools, chest pain and any other symptoms.    MEDICAL HISTORY:  Past Medical History:  Diagnosis Date  . Arthritis    "shoulders" (09/07/2016)  . Atrial fibrillation ([place])   . BPH (benign prostatic hyperplasia)   . Chronic kidney disease (CKD), stage III (moderate)   . Coronary artery disease   . DDD (degenerative disc disease), cervical   . DVT (deep venous thrombosis) ([place]) 12/2013   LLE  . GERD (gastroesophageal reflux disease)   . Gout   . High cholesterol   . History of blood transfusion 09/06/2016   2 u PRBC  . History of scarlet fever 1940s  . History of stress test 06/2011   No significant ischemia, this is a low risk scan. Clinical correlation recommended Abnormal myocardial perfusion study.  Marland Kitchen Hx of echocardiogram 05/2009   EF 40-45%, he did have mild annular calcification with mild-to-moderate MR and mild TR as well as aortic valve sclerosis. Estimated RV systolic pressure was 21 mm.  . Hypertension   . Iron deficiency anemia   . Kidney failure   . 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 Neck pain    "not chronic" (09/07/2016)  . Obesity   . OSA on CPAP   . RBBB   . Renal insufficiency   . Right renal mass   . Spinal stenosis of lumbar region     SURGICAL HISTORY: Past Surgical History:  Procedure Laterality Date  . APPENDECTOMY    . CARDIAC CATHETERIZATION  2009   he was found to have a atretic LIMA graft to his LAD and had a patent vein graft supplying his diagonal vessel. At that time, he had 70% narrowing in his LAD beyond a diagonal vessel which was initially treated medically.  . COLONOSCOPY WITH PROPOFOL Left 09/09/2016   Procedure: COLONOSCOPY WITH PROPOFOL;  Surgeon: Arta Silence, MD;  Location: [facility];  Service: Endoscopy;  Laterality: Left;  . CORONARY  ANGIOPLASTY WITH STENT PLACEMENT  April 2012   LAD DES  . CORONARY ARTERY BYPASS GRAFT  1992   with LIMA to the LAD and diagonal.  . CYSTOSCOPY N/A 03/31/2019   Procedure: CYSTOSCOPY FLEXIBLE;  Surgeon: Lucas Mallow, MD;  Location: [facility];  Service: Urology;  Laterality: N/A;  . CYSTOSCOPY WITH URETHRAL DILATATION N/A 05/21/2019   Procedure: CYSTOSCOPY WITH URETHRAL BALLOON DILATATION;  Surgeon: Lucas Mallow, MD;  Location: [facility];  Service: Urology;  Laterality: N/A;  . ESOPHAGOGASTRODUODENOSCOPY (EGD) WITH PROPOFOL Left 09/08/2016   Procedure: ESOPHAGOGASTRODUODENOSCOPY (EGD) WITH PROPOFOL;  Surgeon: Ronnette Juniper, MD;  Location: [facility];  Service: Gastroenterology;  Laterality: Left;  . INGUINAL HERNIA REPAIR     "don't remeimber which  side"  . LAPAROSCOPIC NEPHRECTOMY Right 03/31/2019   Procedure: RIGHT  HAND ASSIST LAPAROSCOPIC NEPHRECTOMY;  Surgeon: Lucas Mallow, MD;  Location: [facility];  Service: Urology;  Laterality: Right;  . PACEMAKER IMPLANT N/A 12/25/2016   Procedure: PACEMAKER IMPLANT;  Surgeon: Deboraha Sprang, MD;  Location: [facility];  Service: Cardiovascular;  Laterality: N/A;  . TONSILLECTOMY      SOCIAL HISTORY: Social History   Socioeconomic History  . Marital status: Married    Spouse name: Not on file  . Number of children: Not on file  . Years of education: Not on file  . Highest education level: Not on file  Occupational History  . Not on file  Tobacco Use  . Smoking status: Former Smoker    Packs/day: 1.00    Years: 30.00    Pack years: 30.00    Types: Cigarettes  . Smokeless tobacco: Never Used  . Tobacco comment: quit ~ 1985  Vaping Use  . Vaping Use: Never used  Substance and Sexual Activity  . Alcohol use: Yes    Alcohol/week: 0.0 standard drinks    Comment: rare beer  . Drug use: No  . Sexual activity: Not on file  Other Topics Concern  . Not on file  Social History Narrative  . Not on file   Social Determinants  of Health   Financial Resource Strain:   . Difficulty of Paying Living Expenses: Not on file  Food Insecurity:   . Worried About Charity fundraiser in the Last Year: Not on file  . Ran Out of Food in the Last Year: Not on file  Transportation Needs:   . Lack of Transportation (Medical): Not on file  . Lack of Transportation (Non-Medical): Not on file  Physical Activity:   . Days of Exercise per Week: Not on file  . Minutes of Exercise per Session: Not on file  Stress:   . Feeling of Stress : Not on file  Social Connections:   . Frequency of Communication with Friends and Family: Not on file  . Frequency of Social Gatherings with Friends and Family: Not on file  . Attends Religious Services: Not on file  . Active Member of Clubs or Organizations: Not on file  . Attends Archivist Meetings: Not on file  . Marital Status: Not on file  Intimate Partner Violence:   . Fear of Current or Ex-Partner: Not on file  . Emotionally Abused: Not on file  . Physically Abused: Not on file  . Sexually Abused: Not on file    FAMILY HISTORY: Family History  Problem Relation Age of Onset  . Heart disease Father     ALLERGIES:  is allergic to nitrostat [nitroglycerin].  MEDICATIONS:  Current Outpatient Medications  Medication Sig Dispense Refill  . acetaminophen (TYLENOL) 500 MG tablet Take 1,000 mg by mouth every 6 (six) hours as needed for moderate pain.     Marland Kitchen allopurinol (ZYLOPRIM) 300 MG tablet Take 300 mg by mouth daily.    Marland Kitchen amLODipine (NORVASC) 5 MG tablet Take 2.5 mg by mouth daily.     Marland Kitchen atorvastatin (LIPITOR) 20 MG tablet TAKE 1 TABLET BY MOUTH DAILY 90 tablet 2  . b complex vitamins tablet Take 1 tablet by mouth daily.    . fenofibrate micronized (LOFIBRA) 134 MG capsule Take 134 mg by mouth daily before breakfast.    . furosemide (LASIX) 20 MG tablet Take 10-20 mg by mouth See admin instructions.  20 mg on even days, 10 mg on odd days    . metoprolol succinate  (TOPROL-XL) 25 MG 24 hr tablet TAKE 1 TABLET BY MOUTH DAILY (Patient taking differently: Take 25 mg by mouth daily. ) 90 tablet 3  . omeprazole (PRILOSEC) 20 MG capsule Take 20 mg by mouth at bedtime.     . Rivaroxaban (XARELTO) 15 MG TABS tablet Take 1 tablet (15 mg total) by mouth daily with supper. 30 tablet 1  . Tamsulosin HCl (FLOMAX) 0.4 MG CAPS Take 1 capsule (0.4 mg total) by mouth daily. 30 capsule 0   No current facility-administered medications for this visit.    REVIEW OF SYSTEMS:   A 10+ POINT REVIEW OF SYSTEMS WAS OBTAINED including neurology, dermatology, psychiatry, cardiac, respiratory, lymph, extremities, GI, GU, Musculoskeletal, constitutional, breasts, reproductive, HEENT.  All pertinent positives are noted in the HPI.  All others are negative.   PHYSICAL EXAMINATION: ECOG PERFORMANCE STATUS: 1 - Symptomatic but completely ambulatory  . Vitals:   10/20/19 1033  BP: 137/62  Pulse: 66  Resp: 18  Temp: (!) 95.6 F (35.3 C)  SpO2: 100%   Filed Weights   10/20/19 1033  Weight: 189 lb 11.2 oz (86 kg)   .Body mass index is 30.62 kg/m.   GENERAL:alert, in no acute distress and comfortable SKIN: no acute rashes, no significant lesions EYES: conjunctiva are pink and non-injected, sclera anicteric OROPHARYNX: MMM, no exudates, no oropharyngeal erythema or ulceration NECK: supple, no JVD LYMPH:  no palpable lymphadenopathy in the cervical, axillary or inguinal regions LUNGS: clear to auscultation b/l with normal respiratory effort HEART: regular rate & rhythm ABDOMEN:  normoactive bowel sounds , non tender, not distended. No palpable hepatosplenomegaly.  Extremity: 2+ pedal edema b/l PSYCH: alert & oriented x 3 with fluent speech NEURO: no focal motor/sensory deficits  LABORATORY DATA:  I have reviewed the data as listed  . CBC Latest Ref Rng & Units 10/20/2019 10/07/2019 08/12/2019  WBC 4.0 - 10.5 K/uL 6.8 - 7.1  Hemoglobin 13.0 - 17.0 g/dL 7.7(L) 7.8(L) 8.2(L)   Hematocrit 39 - 52 % 26.2(L) - 27.0(L)  Platelets 150 - 400 K/uL 146(L) - 141(L)   . CBC    Component Value Date/Time   WBC 6.8 10/20/2019 0924   WBC 7.1 08/12/2019 0959   RBC 2.83 (L) 10/20/2019 0924   HGB 7.7 (L) 10/20/2019 0924   HCT 26.2 (L) 10/20/2019 0924   HCT 39.9 10/01/2018 1130   PLT 146 (L) 10/20/2019 0924   MCV 92.6 10/20/2019 0924   MCH 27.2 10/20/2019 0924   MCHC 29.4 (L) 10/20/2019 0924   RDW 20.8 (H) 10/20/2019 0924   LYMPHSABS 0.7 10/20/2019 0924   MONOABS 0.3 10/20/2019 0924   EOSABS 0.2 10/20/2019 0924   BASOSABS 0.0 10/20/2019 0924    . CMP Latest Ref Rng & Units 10/20/2019 08/12/2019 05/14/2019  Glucose 70 - 99 mg/dL 117(H) 109(H) 137(H)  BUN 8 - 23 mg/dL 39(H) 48(H) 61(H)  Creatinine 0.61 - 1.24 mg/dL 3.18(HH) 3.14(HH) 3.47(H)  Sodium 135 - 145 mmol/L 142 139 138  Potassium 3.5 - 5.1 mmol/L 4.0 4.2 3.8  Chloride 98 - 111 mmol/L 113(H) 112(H) 106  CO2 22 - 32 mmol/L 21(L) 20(L) 21(L)  Calcium 8.9 - 10.3 mg/dL 9.3 9.5 8.9  Total Protein 6.5 - 8.1 g/dL 6.2(L) 6.7 -  Total Bilirubin 0.3 - 1.2 mg/dL 0.5 0.3 -  Alkaline Phos 38 - 126 U/L 155(H) 124 -  AST 15 - 41 U/L  18 21 -  ALT 0 - 44 U/L 12 13 -   . Lab Results  Component Value Date   IRON 26 (L) 10/20/2019   TIBC 273 10/20/2019   IRONPCTSAT 10 (L) 10/20/2019   (Iron and TIBC)  Lab Results  Component Value Date   FERRITIN 145 10/20/2019     09/09/2016 Colonoscopy    09/08/2016 Upper Endoscopy    RADIOGRAPHIC STUDIES: I have personally reviewed the radiological images as listed and agreed with the findings in the report. No results found.  ASSESSMENT & PLAN:   1) Normocytic Anemia - likely related to CKD and concern for GI bleeding  PLAN: -Discussed pt labwork today, 10/20/19; Hgb is low, WBC & PLT are okay, kidney function appears relatively stable. -Discussed 10/20/2019 Ferritin at 145 -Discussed 10/20/2019 Vitamin B12 at 931 -Discussed 10/20/2019 Iron Panel is as follows:  Iron at 26, TIBC at 273, Sat Ratios at 10, UIBC at 246. -Advised pt that Xarelto can accumulate with poor kidney function. Recommend pt f/u with Dr. Claiborne Billings for Xarelto optimization.  -Recommend pt f/u with Dr. Arty Baumgartner for Lasix, IV Iron, and ESA management. -Will defer ongoing management of CKD to Dr. Arty Baumgartner. Will see back as needed. -Recommend pt f/u with our clinic or Dr. Arty Baumgartner is he becomes significantly more weak, lightheaded, or short of breath.  -Continue B-complex vitamin daily     FOLLOW UP: RTC with Dr Meredeth Ide  RTC with hematology as needed  The total time spent in the appt was 20 minutes and more than 50% was on counseling and direct patient cares.  All of the patient's questions were answered with apparent satisfaction. The patient knows to call the clinic with any problems, questions or concerns.    Sullivan Lone MD [place] AAHIVMS [facility] Hematology/Oncology Physician [facility]  (Office):       [phone] (Work cell):  [phone] (Fax):           [phone]  10/20/2019 1:14 PM  I, Yevette Edwards, am acting as a scribe for Dr. Sullivan Lone.   .I have reviewed the above documentation for accuracy and completeness, and I agree with the above. Brunetta Genera MD

## 2019-10-21 ENCOUNTER — Encounter (HOSPITAL_COMMUNITY): Payer: Medicare Other

## 2019-10-21 ENCOUNTER — Encounter (HOSPITAL_COMMUNITY)
Admission: RE | Admit: 2019-10-21 | Discharge: 2019-10-21 | Disposition: A | Discharge status: Home / Self Care | Payer: Medicare Other | Source: Ambulatory Visit | Attending: Nephrology | Admitting: Nephrology

## 2019-10-21 VITALS — BP 139/61 | HR 60 | Temp 97.5°F | Resp 20

## 2019-10-21 DIAGNOSIS — N185 Chronic kidney disease, stage 5: Secondary | ICD-10-CM | POA: Insufficient documentation

## 2019-10-21 DIAGNOSIS — D631 Anemia in chronic kidney disease: Secondary | ICD-10-CM | POA: Insufficient documentation

## 2019-10-21 LAB — POCT HEMOGLOBIN-HEMACUE: Hemoglobin: 8.2 g/dL — ABNORMAL LOW (ref 13.0–17.0)

## 2019-10-21 MED ORDER — EPOETIN ALFA-EPBX 10000 UNIT/ML IJ SOLN
INTRAMUSCULAR | Status: AC
  Filled 2019-10-21: qty 2

## 2019-10-21 MED ORDER — EPOETIN ALFA-EPBX 10000 UNIT/ML IJ SOLN
20000.0000 [IU] | INTRAMUSCULAR | Status: DC
  Administered 2019-10-21: 20000 [IU] via SUBCUTANEOUS

## 2019-10-21 MED ORDER — SODIUM CHLORIDE 0.9 % IV SOLN
510.0000 mg | INTRAVENOUS | Status: AC
  Administered 2019-10-21: 510 mg via INTRAVENOUS
  Filled 2019-10-21: qty 510

## 2019-10-23 DIAGNOSIS — N185 Chronic kidney disease, stage 5: Secondary | ICD-10-CM | POA: Diagnosis not present

## 2019-10-23 DIAGNOSIS — N2581 Secondary hyperparathyroidism of renal origin: Secondary | ICD-10-CM | POA: Diagnosis not present

## 2019-10-23 DIAGNOSIS — I120 Hypertensive chronic kidney disease with stage 5 chronic kidney disease or end stage renal disease: Secondary | ICD-10-CM | POA: Diagnosis not present

## 2019-10-23 DIAGNOSIS — N179 Acute kidney failure, unspecified: Secondary | ICD-10-CM | POA: Diagnosis not present

## 2019-10-23 DIAGNOSIS — D631 Anemia in chronic kidney disease: Secondary | ICD-10-CM | POA: Diagnosis not present

## 2019-10-24 ENCOUNTER — Ambulatory Visit (INDEPENDENT_AMBULATORY_CARE_PROVIDER_SITE_OTHER): Payer: Medicare Other

## 2019-10-24 DIAGNOSIS — I441 Atrioventricular block, second degree: Secondary | ICD-10-CM

## 2019-10-25 LAB — CUP PACEART REMOTE DEVICE CHECK
Battery Remaining Longevity: 80 mo
Battery Voltage: 2.91 V
Brady Statistic AP VP Percent: 32.08 %
Brady Statistic AP VS Percent: 0 %
Brady Statistic AS VP Percent: 66.98 %
Brady Statistic AS VS Percent: 0.93 %
Brady Statistic RA Percent Paced: 32.85 %
Brady Statistic RV Percent Paced: 99.06 %
Date Time Interrogation Session: 20211014193601
Implantable Lead Implant Date: 20181217
Implantable Lead Implant Date: 20181217
Implantable Lead Location: 753859
Implantable Lead Location: 753859
Implantable Lead Model: 3830
Implantable Lead Model: 5076
Implantable Pulse Generator Implant Date: 20181217
Lead Channel Impedance Value: 266 Ohm
Lead Channel Impedance Value: 304 Ohm
Lead Channel Impedance Value: 361 Ohm
Lead Channel Impedance Value: 380 Ohm
Lead Channel Pacing Threshold Amplitude: 0.5 V
Lead Channel Pacing Threshold Amplitude: 0.75 V
Lead Channel Pacing Threshold Pulse Width: 0.4 ms
Lead Channel Pacing Threshold Pulse Width: 0.4 ms
Lead Channel Sensing Intrinsic Amplitude: 0.75 mV
Lead Channel Sensing Intrinsic Amplitude: 0.75 mV
Lead Channel Sensing Intrinsic Amplitude: 12.25 mV
Lead Channel Sensing Intrinsic Amplitude: 12.25 mV
Lead Channel Setting Pacing Amplitude: 2 V
Lead Channel Setting Pacing Amplitude: 2.5 V
Lead Channel Setting Pacing Pulse Width: 1 ms
Lead Channel Setting Sensing Sensitivity: 1.2 mV

## 2019-10-29 NOTE — Progress Notes (Signed)
Remote pacemaker transmission.   

## 2019-11-04 ENCOUNTER — Encounter (HOSPITAL_COMMUNITY)
Admission: RE | Admit: 2019-11-04 | Discharge: 2019-11-04 | Disposition: A | Discharge status: Home / Self Care | Payer: Medicare Other | Source: Ambulatory Visit | Attending: Nephrology | Admitting: Nephrology

## 2019-11-04 ENCOUNTER — Other Ambulatory Visit: Payer: Self-pay

## 2019-11-04 VITALS — BP 142/65 | HR 69 | Temp 97.5°F | Resp 20

## 2019-11-04 DIAGNOSIS — N185 Chronic kidney disease, stage 5: Secondary | ICD-10-CM | POA: Diagnosis not present

## 2019-11-04 DIAGNOSIS — D631 Anemia in chronic kidney disease: Secondary | ICD-10-CM

## 2019-11-04 LAB — CBC WITH DIFFERENTIAL/PLATELET
Abs Immature Granulocytes: 0.06 10*3/uL (ref 0.00–0.07)
Basophils Absolute: 0 10*3/uL (ref 0.0–0.1)
Basophils Relative: 0 %
Eosinophils Absolute: 0.2 10*3/uL (ref 0.0–0.5)
Eosinophils Relative: 3 %
HCT: 31.2 % — ABNORMAL LOW (ref 39.0–52.0)
Hemoglobin: 9.2 g/dL — ABNORMAL LOW (ref 13.0–17.0)
Immature Granulocytes: 1 %
Lymphocytes Relative: 12 %
Lymphs Abs: 0.9 10*3/uL (ref 0.7–4.0)
MCH: 27.9 pg (ref 26.0–34.0)
MCHC: 29.5 g/dL — ABNORMAL LOW (ref 30.0–36.0)
MCV: 94.5 fL (ref 80.0–100.0)
Monocytes Absolute: 0.4 10*3/uL (ref 0.1–1.0)
Monocytes Relative: 5 %
Neutro Abs: 6 10*3/uL (ref 1.7–7.7)
Neutrophils Relative %: 79 %
Platelets: 166 10*3/uL (ref 150–400)
RBC: 3.3 MIL/uL — ABNORMAL LOW (ref 4.22–5.81)
RDW: 21.3 % — ABNORMAL HIGH (ref 11.5–15.5)
WBC: 7.5 10*3/uL (ref 4.0–10.5)
nRBC: 0 % (ref 0.0–0.2)

## 2019-11-04 LAB — RENAL FUNCTION PANEL
Albumin: 3.5 g/dL (ref 3.5–5.0)
Anion gap: 12 (ref 5–15)
BUN: 53 mg/dL — ABNORMAL HIGH (ref 8–23)
CO2: 23 mmol/L (ref 22–32)
Calcium: 9.4 mg/dL (ref 8.9–10.3)
Chloride: 105 mmol/L (ref 98–111)
Creatinine, Ser: 3.24 mg/dL — ABNORMAL HIGH (ref 0.61–1.24)
GFR, Estimated: 18 mL/min — ABNORMAL LOW (ref >60)
Glucose, Bld: 147 mg/dL — ABNORMAL HIGH (ref 70–99)
Phosphorus: 2.8 mg/dL (ref 2.5–4.6)
Potassium: 3.8 mmol/L (ref 3.5–5.1)
Sodium: 140 mmol/L (ref 135–145)

## 2019-11-04 LAB — POCT HEMOGLOBIN-HEMACUE: Hemoglobin: 9.4 g/dL — ABNORMAL LOW (ref 13.0–17.0)

## 2019-11-04 MED ORDER — EPOETIN ALFA-EPBX 10000 UNIT/ML IJ SOLN
INTRAMUSCULAR | Status: AC
  Filled 2019-11-04: qty 2

## 2019-11-04 MED ORDER — EPOETIN ALFA-EPBX 10000 UNIT/ML IJ SOLN
20000.0000 [IU] | INTRAMUSCULAR | Status: DC
  Administered 2019-11-04: 20000 [IU] via SUBCUTANEOUS

## 2019-11-10 DIAGNOSIS — G4733 Obstructive sleep apnea (adult) (pediatric): Secondary | ICD-10-CM | POA: Diagnosis not present

## 2019-11-14 ENCOUNTER — Encounter (HOSPITAL_COMMUNITY): Payer: Self-pay | Admitting: Internal Medicine

## 2019-11-14 ENCOUNTER — Inpatient Hospital Stay (HOSPITAL_COMMUNITY)
Admission: EM | Admit: 2019-11-14 | Discharge: 2019-11-17 | DRG: 378 | Disposition: A | Discharge status: Home / Self Care | Payer: Medicare Other | Source: Ambulatory Visit | Attending: Internal Medicine | Admitting: Internal Medicine

## 2019-11-14 ENCOUNTER — Other Ambulatory Visit: Payer: Self-pay

## 2019-11-14 DIAGNOSIS — K2981 Duodenitis with bleeding: Principal | ICD-10-CM | POA: Diagnosis present

## 2019-11-14 DIAGNOSIS — I5042 Chronic combined systolic (congestive) and diastolic (congestive) heart failure: Secondary | ICD-10-CM | POA: Diagnosis present

## 2019-11-14 DIAGNOSIS — Z87891 Personal history of nicotine dependence: Secondary | ICD-10-CM | POA: Diagnosis not present

## 2019-11-14 DIAGNOSIS — Z905 Acquired absence of kidney: Secondary | ICD-10-CM

## 2019-11-14 DIAGNOSIS — N179 Acute kidney failure, unspecified: Secondary | ICD-10-CM | POA: Diagnosis not present

## 2019-11-14 DIAGNOSIS — Z20822 Contact with and (suspected) exposure to covid-19: Secondary | ICD-10-CM | POA: Diagnosis present

## 2019-11-14 DIAGNOSIS — Z951 Presence of aortocoronary bypass graft: Secondary | ICD-10-CM

## 2019-11-14 DIAGNOSIS — K2980 Duodenitis without bleeding: Secondary | ICD-10-CM | POA: Diagnosis not present

## 2019-11-14 DIAGNOSIS — D631 Anemia in chronic kidney disease: Secondary | ICD-10-CM | POA: Diagnosis not present

## 2019-11-14 DIAGNOSIS — I2510 Atherosclerotic heart disease of native coronary artery without angina pectoris: Secondary | ICD-10-CM | POA: Diagnosis present

## 2019-11-14 DIAGNOSIS — K921 Melena: Secondary | ICD-10-CM | POA: Diagnosis present

## 2019-11-14 DIAGNOSIS — Z66 Do not resuscitate: Secondary | ICD-10-CM | POA: Diagnosis present

## 2019-11-14 DIAGNOSIS — M19011 Primary osteoarthritis, right shoulder: Secondary | ICD-10-CM | POA: Diagnosis present

## 2019-11-14 DIAGNOSIS — D62 Acute posthemorrhagic anemia: Secondary | ICD-10-CM | POA: Diagnosis not present

## 2019-11-14 DIAGNOSIS — K648 Other hemorrhoids: Secondary | ICD-10-CM | POA: Diagnosis present

## 2019-11-14 DIAGNOSIS — Z8249 Family history of ischemic heart disease and other diseases of the circulatory system: Secondary | ICD-10-CM

## 2019-11-14 DIAGNOSIS — N185 Chronic kidney disease, stage 5: Secondary | ICD-10-CM | POA: Diagnosis present

## 2019-11-14 DIAGNOSIS — M19012 Primary osteoarthritis, left shoulder: Secondary | ICD-10-CM | POA: Diagnosis not present

## 2019-11-14 DIAGNOSIS — K922 Gastrointestinal hemorrhage, unspecified: Secondary | ICD-10-CM | POA: Diagnosis not present

## 2019-11-14 DIAGNOSIS — Z86718 Personal history of other venous thrombosis and embolism: Secondary | ICD-10-CM | POA: Diagnosis not present

## 2019-11-14 DIAGNOSIS — I5032 Chronic diastolic (congestive) heart failure: Secondary | ICD-10-CM | POA: Diagnosis present

## 2019-11-14 DIAGNOSIS — G4733 Obstructive sleep apnea (adult) (pediatric): Secondary | ICD-10-CM

## 2019-11-14 DIAGNOSIS — E860 Dehydration: Secondary | ICD-10-CM | POA: Diagnosis present

## 2019-11-14 DIAGNOSIS — I480 Paroxysmal atrial fibrillation: Secondary | ICD-10-CM | POA: Diagnosis not present

## 2019-11-14 DIAGNOSIS — N400 Enlarged prostate without lower urinary tract symptoms: Secondary | ICD-10-CM | POA: Diagnosis present

## 2019-11-14 DIAGNOSIS — D649 Anemia, unspecified: Secondary | ICD-10-CM | POA: Diagnosis not present

## 2019-11-14 DIAGNOSIS — Z79899 Other long term (current) drug therapy: Secondary | ICD-10-CM

## 2019-11-14 DIAGNOSIS — K3189 Other diseases of stomach and duodenum: Secondary | ICD-10-CM | POA: Diagnosis not present

## 2019-11-14 DIAGNOSIS — K5730 Diverticulosis of large intestine without perforation or abscess without bleeding: Secondary | ICD-10-CM | POA: Diagnosis present

## 2019-11-14 DIAGNOSIS — R42 Dizziness and giddiness: Secondary | ICD-10-CM | POA: Diagnosis not present

## 2019-11-14 DIAGNOSIS — I959 Hypotension, unspecified: Secondary | ICD-10-CM | POA: Diagnosis present

## 2019-11-14 DIAGNOSIS — E785 Hyperlipidemia, unspecified: Secondary | ICD-10-CM | POA: Diagnosis present

## 2019-11-14 DIAGNOSIS — K2971 Gastritis, unspecified, with bleeding: Secondary | ICD-10-CM | POA: Diagnosis not present

## 2019-11-14 DIAGNOSIS — I4891 Unspecified atrial fibrillation: Secondary | ICD-10-CM | POA: Diagnosis not present

## 2019-11-14 DIAGNOSIS — D500 Iron deficiency anemia secondary to blood loss (chronic): Secondary | ICD-10-CM | POA: Diagnosis present

## 2019-11-14 DIAGNOSIS — K2970 Gastritis, unspecified, without bleeding: Secondary | ICD-10-CM | POA: Diagnosis not present

## 2019-11-14 DIAGNOSIS — N184 Chronic kidney disease, stage 4 (severe): Secondary | ICD-10-CM | POA: Diagnosis not present

## 2019-11-14 DIAGNOSIS — I129 Hypertensive chronic kidney disease with stage 1 through stage 4 chronic kidney disease, or unspecified chronic kidney disease: Secondary | ICD-10-CM | POA: Diagnosis not present

## 2019-11-14 DIAGNOSIS — M109 Gout, unspecified: Secondary | ICD-10-CM | POA: Diagnosis not present

## 2019-11-14 DIAGNOSIS — K2100 Gastro-esophageal reflux disease with esophagitis, without bleeding: Secondary | ICD-10-CM | POA: Diagnosis present

## 2019-11-14 DIAGNOSIS — I10 Essential (primary) hypertension: Secondary | ICD-10-CM | POA: Diagnosis present

## 2019-11-14 DIAGNOSIS — Z9989 Dependence on other enabling machines and devices: Secondary | ICD-10-CM

## 2019-11-14 DIAGNOSIS — I132 Hypertensive heart and chronic kidney disease with heart failure and with stage 5 chronic kidney disease, or end stage renal disease: Secondary | ICD-10-CM | POA: Diagnosis not present

## 2019-11-14 DIAGNOSIS — R233 Spontaneous ecchymoses: Secondary | ICD-10-CM | POA: Diagnosis present

## 2019-11-14 DIAGNOSIS — K644 Residual hemorrhoidal skin tags: Secondary | ICD-10-CM | POA: Diagnosis present

## 2019-11-14 DIAGNOSIS — Z7901 Long term (current) use of anticoagulants: Secondary | ICD-10-CM

## 2019-11-14 LAB — CBC WITH DIFFERENTIAL/PLATELET
Abs Immature Granulocytes: 0.08 10*3/uL — ABNORMAL HIGH (ref 0.00–0.07)
Basophils Absolute: 0 10*3/uL (ref 0.0–0.1)
Basophils Relative: 0 %
Eosinophils Absolute: 0.2 10*3/uL (ref 0.0–0.5)
Eosinophils Relative: 2 %
HCT: 21.2 % — ABNORMAL LOW (ref 39.0–52.0)
Hemoglobin: 6.2 g/dL — CL (ref 13.0–17.0)
Immature Granulocytes: 1 %
Lymphocytes Relative: 11 %
Lymphs Abs: 1.1 10*3/uL (ref 0.7–4.0)
MCH: 28.8 pg (ref 26.0–34.0)
MCHC: 29.2 g/dL — ABNORMAL LOW (ref 30.0–36.0)
MCV: 98.6 fL (ref 80.0–100.0)
Monocytes Absolute: 0.4 10*3/uL (ref 0.1–1.0)
Monocytes Relative: 4 %
Neutro Abs: 8 10*3/uL — ABNORMAL HIGH (ref 1.7–7.7)
Neutrophils Relative %: 82 %
Platelets: 174 10*3/uL (ref 150–400)
RBC: 2.15 MIL/uL — ABNORMAL LOW (ref 4.22–5.81)
RDW: 22.9 % — ABNORMAL HIGH (ref 11.5–15.5)
WBC: 9.7 10*3/uL (ref 4.0–10.5)
nRBC: 0 % (ref 0.0–0.2)

## 2019-11-14 LAB — COMPREHENSIVE METABOLIC PANEL
ALT: 13 U/L (ref 0–44)
AST: 31 U/L (ref 15–41)
Albumin: 3.2 g/dL — ABNORMAL LOW (ref 3.5–5.0)
Alkaline Phosphatase: 88 U/L (ref 38–126)
Anion gap: 12 (ref 5–15)
BUN: 102 mg/dL — ABNORMAL HIGH (ref 8–23)
CO2: 21 mmol/L — ABNORMAL LOW (ref 22–32)
Calcium: 8.9 mg/dL (ref 8.9–10.3)
Chloride: 107 mmol/L (ref 98–111)
Creatinine, Ser: 4.58 mg/dL — ABNORMAL HIGH (ref 0.61–1.24)
GFR, Estimated: 12 mL/min — ABNORMAL LOW (ref >60)
Glucose, Bld: 135 mg/dL — ABNORMAL HIGH (ref 70–99)
Potassium: 5.2 mmol/L — ABNORMAL HIGH (ref 3.5–5.1)
Sodium: 140 mmol/L (ref 135–145)
Total Bilirubin: 0.4 mg/dL (ref 0.3–1.2)
Total Protein: 5.9 g/dL — ABNORMAL LOW (ref 6.5–8.1)

## 2019-11-14 LAB — IRON AND TIBC
Iron: 39 ug/dL — ABNORMAL LOW (ref 45–182)
Saturation Ratios: 16 % — ABNORMAL LOW (ref 17.9–39.5)
TIBC: 239 ug/dL — ABNORMAL LOW (ref 250–450)
UIBC: 200 ug/dL

## 2019-11-14 LAB — CBC
HCT: 22.7 % — ABNORMAL LOW (ref 39.0–52.0)
Hemoglobin: 6.9 g/dL — CL (ref 13.0–17.0)
MCH: 27.8 pg (ref 26.0–34.0)
MCHC: 30.4 g/dL (ref 30.0–36.0)
MCV: 91.5 fL (ref 80.0–100.0)
Platelets: 144 10*3/uL — ABNORMAL LOW (ref 150–400)
RBC: 2.48 MIL/uL — ABNORMAL LOW (ref 4.22–5.81)
RDW: 23.9 % — ABNORMAL HIGH (ref 11.5–15.5)
WBC: 10.3 10*3/uL (ref 4.0–10.5)
nRBC: 0.2 % (ref 0.0–0.2)

## 2019-11-14 LAB — RETICULOCYTES
Immature Retic Fract: 39 % — ABNORMAL HIGH (ref 2.3–15.9)
RBC.: 2.17 MIL/uL — ABNORMAL LOW (ref 4.22–5.81)
Retic Count, Absolute: 112.4 10*3/uL (ref 19.0–186.0)
Retic Ct Pct: 5.2 % — ABNORMAL HIGH (ref 0.4–3.1)

## 2019-11-14 LAB — RESPIRATORY PANEL BY RT PCR (FLU A&B, COVID)
Influenza A by PCR: NEGATIVE
Influenza B by PCR: NEGATIVE
SARS Coronavirus 2 by RT PCR: NEGATIVE

## 2019-11-14 LAB — FOLATE: Folate: 38.1 ng/mL

## 2019-11-14 LAB — VITAMIN B12: Vitamin B-12: 424 pg/mL (ref 180–914)

## 2019-11-14 LAB — PROTIME-INR
INR: 2.6 — ABNORMAL HIGH (ref 0.8–1.2)
Prothrombin Time: 26.7 seconds — ABNORMAL HIGH (ref 11.4–15.2)

## 2019-11-14 LAB — PREPARE RBC (CROSSMATCH)

## 2019-11-14 LAB — FERRITIN: Ferritin: 70 ng/mL (ref 24–336)

## 2019-11-14 LAB — POC OCCULT BLOOD, ED: Fecal Occult Bld: POSITIVE — AB

## 2019-11-14 MED ORDER — CALCIUM CARBONATE ANTACID 1250 MG/5ML PO SUSP
500.0000 mg | Freq: Four times a day (QID) | ORAL | Status: DC | PRN
  Filled 2019-11-14: qty 5

## 2019-11-14 MED ORDER — SODIUM CHLORIDE 0.9% IV SOLUTION: Freq: Once | INTRAVENOUS | Status: AC

## 2019-11-14 MED ORDER — PANTOPRAZOLE SODIUM 40 MG IV SOLR
40.0000 mg | Freq: Two times a day (BID) | INTRAVENOUS | Status: DC
  Administered 2019-11-14 – 2019-11-17 (×6): 40 mg via INTRAVENOUS
  Filled 2019-11-14 (×6): qty 40

## 2019-11-14 MED ORDER — AMLODIPINE BESYLATE 5 MG PO TABS
5.0000 mg | ORAL_TABLET | Freq: Every day | ORAL | Status: DC
  Administered 2019-11-15 – 2019-11-17 (×3): 5 mg via ORAL
  Filled 2019-11-14 (×3): qty 1

## 2019-11-14 MED ORDER — ZOLPIDEM TARTRATE 5 MG PO TABS: 5.0000 mg | ORAL_TABLET | Freq: Every evening | ORAL | Status: DC | PRN

## 2019-11-14 MED ORDER — TAMSULOSIN HCL 0.4 MG PO CAPS
0.4000 mg | ORAL_CAPSULE | Freq: Every day | ORAL | Status: DC
  Administered 2019-11-15 – 2019-11-17 (×3): 0.4 mg via ORAL
  Filled 2019-11-14 (×3): qty 1

## 2019-11-14 MED ORDER — ACETAMINOPHEN 325 MG PO TABS: 650.0000 mg | ORAL_TABLET | Freq: Four times a day (QID) | ORAL | Status: DC | PRN

## 2019-11-14 MED ORDER — SODIUM CHLORIDE 0.9 % IV SOLN
80.0000 mg | Freq: Once | INTRAVENOUS | Status: AC
  Administered 2019-11-14: 80 mg via INTRAVENOUS
  Filled 2019-11-14: qty 80

## 2019-11-14 MED ORDER — DOCUSATE SODIUM 283 MG RE ENEM
1.0000 | ENEMA | RECTAL | Status: DC | PRN
  Filled 2019-11-14: qty 1

## 2019-11-14 MED ORDER — HYDROXYZINE HCL 25 MG PO TABS: 25.0000 mg | ORAL_TABLET | Freq: Three times a day (TID) | ORAL | Status: DC | PRN

## 2019-11-14 MED ORDER — HYDRALAZINE HCL 20 MG/ML IJ SOLN: 5.0000 mg | INTRAMUSCULAR | Status: DC | PRN

## 2019-11-14 MED ORDER — ONDANSETRON HCL 4 MG/2ML IJ SOLN: 4.0000 mg | Freq: Four times a day (QID) | INTRAMUSCULAR | Status: DC | PRN

## 2019-11-14 MED ORDER — ACETAMINOPHEN 650 MG RE SUPP: 650.0000 mg | Freq: Four times a day (QID) | RECTAL | Status: DC | PRN

## 2019-11-14 MED ORDER — ATORVASTATIN CALCIUM 10 MG PO TABS
20.0000 mg | ORAL_TABLET | Freq: Every day | ORAL | Status: DC
  Administered 2019-11-15 – 2019-11-17 (×3): 20 mg via ORAL
  Filled 2019-11-14 (×3): qty 2

## 2019-11-14 MED ORDER — LACTATED RINGERS IV SOLN: INTRAVENOUS | Status: AC

## 2019-11-14 MED ORDER — ALLOPURINOL 100 MG PO TABS
300.0000 mg | ORAL_TABLET | Freq: Every day | ORAL | Status: DC
  Administered 2019-11-15: 300 mg via ORAL
  Filled 2019-11-14: qty 3

## 2019-11-14 MED ORDER — SODIUM CHLORIDE 0.9 % IV SOLN: 10.0000 mL/h | Freq: Once | INTRAVENOUS | Status: DC

## 2019-11-14 MED ORDER — FUROSEMIDE 10 MG/ML IJ SOLN: 20.0000 mg | Freq: Once | INTRAMUSCULAR | Status: DC

## 2019-11-14 MED ORDER — NEPRO/CARBSTEADY PO LIQD
237.0000 mL | Freq: Three times a day (TID) | ORAL | Status: DC | PRN
  Filled 2019-11-14: qty 237

## 2019-11-14 MED ORDER — ONDANSETRON HCL 4 MG PO TABS: 4.0000 mg | ORAL_TABLET | Freq: Four times a day (QID) | ORAL | Status: DC | PRN

## 2019-11-14 MED ORDER — METOPROLOL SUCCINATE ER 25 MG PO TB24: 25.0000 mg | ORAL_TABLET | Freq: Every day | ORAL | Status: DC

## 2019-11-14 MED ORDER — CAMPHOR-MENTHOL 0.5-0.5 % EX LOTN
1.0000 "application " | TOPICAL_LOTION | Freq: Three times a day (TID) | CUTANEOUS | Status: DC | PRN
  Filled 2019-11-14: qty 222

## 2019-11-14 MED ORDER — SORBITOL 70 % SOLN
30.0000 mL | Status: DC | PRN
  Filled 2019-11-14: qty 30

## 2019-11-14 NOTE — ED Provider Notes (Signed)
Emergency Department Provider Note   I have reviewed the triage vital signs and the nursing notes.   HISTORY  Chief Complaint Medical Management of Chronic Issues (transported from Dr. office d/t Hgb 5.9)   HPI Paul Price is a 79 y.o. male with past medical history reviewed below including chronic kidney disease, anemia, and A. fib on Xarelto presents to the emergency department from his PCP office with significantly decreased hemoglobin reportedly now down to 5.9.  Patient had labs earlier last month with hemoglobin around nine.  He is followed by nephrology and hematology.  He is taking supplements at home and coming in for IV iron infusions.  Patient tells me that his chronic kidney disease is being monitored and he is due to see vascular surgery next month for consideration of AV fistula placement in preparation for possible dialysis.  He is continued with his Xarelto and has not seen any black or bright red blood in his bowel movements.  He is not experiencing any abdominal pain, shortness of breath, chest pain.  He has developed increased generalized weakness over the past several days. No syncope. No SOB. No radiation of symptoms or modifying factors.    Past Medical History:  Diagnosis Date  . Arthritis    "shoulders" (09/07/2016)  . Atrial fibrillation ([place])   . BPH (benign prostatic hyperplasia)   . Chronic kidney disease (CKD), stage III (moderate) (HCC)   . Coronary artery disease   . DDD (degenerative disc disease), cervical   . DVT (deep venous thrombosis) ([place]) 12/2013   LLE  . GERD (gastroesophageal reflux disease)   . Gout   . High cholesterol   . History of blood transfusion 09/06/2016   2 u PRBC  . History of scarlet fever 1940s  . History of stress test 06/2011   No significant ischemia, this is a low risk scan. Clinical correlation recommended Abnormal myocardial perfusion study.  Marland Kitchen Hx of echocardiogram 05/2009   EF 40-45%, he did have mild annular  calcification with mild-to-moderate MR and mild TR as well as aortic valve sclerosis. Estimated RV systolic pressure was 21 mm.  . Hypertension   . Iron deficiency anemia   . Kidney failure   . 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 Neck pain    "not chronic" (09/07/2016)  . Obesity   . OSA on CPAP   . RBBB   . Renal insufficiency   . Right renal mass   . Spinal stenosis of lumbar region     Patient Active Problem List   Diagnosis Date Noted  . Symptomatic anemia 11/14/2019  . Stage 5 chronic kidney disease ([place]) 11/14/2019  . Anemia in chronic kidney disease 08/18/2019  . Renal mass 03/31/2019  . Acute on chronic systolic CHF (congestive heart failure) ([place])   . 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 Symptomatic bradycardia 12/21/2016  . Systolic congestive heart failure ([place]) 12/20/2016  . Normocytic anemia 09/06/2016  . Upper GI bleeding 09/06/2016  . Acute blood loss anemia   . New onset a-fib ([place]) 08/08/2016  . Chronic diastolic (congestive) heart failure ([place]) 08/08/2016  . Hypotension 08/08/2016  . A-fib ([place]) 08/08/2016  . Primary gout   . 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 Syncope 12/02/2013  . Left leg DVT ([place]) 12/02/2013  . Fever 12/02/2013  . AKI (acute kidney injury) ([place]) 12/02/2013  . Renal insufficiency 07/02/2013  . PAF (paroxysmal atrial fibrillation) ([place]) 01/04/2013  . Sinus pause, 4.3  seconds.  Recorded by [facility]. 12/02/2012  . NSVT (nonsustained ventricular tachycardia) ([place]) 12/02/2012  . Dyspnea on exertion 11/20/2012  . 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 HTN (hypertension) 08/20/2012  . Hyperlipidemia with target LDL less than 70 08/20/2012  . Obesity (BMI 30-39.9) 08/20/2012  . OSA on CPAP 08/20/2012  . RBBB 08/20/2012    Past Surgical History:  Procedure Laterality Date  . APPENDECTOMY    . CARDIAC CATHETERIZATION  2009   he was found to have a atretic LIMA graft to his LAD and had a patent vein graft  supplying his diagonal vessel. At that time, he had 70% narrowing in his LAD beyond a diagonal vessel which was initially treated medically.  . COLONOSCOPY WITH PROPOFOL Left 09/09/2016   Procedure: COLONOSCOPY WITH PROPOFOL;  Surgeon: Arta Silence, MD;  Location: [facility];  Service: Endoscopy;  Laterality: Left;  . CORONARY ANGIOPLASTY WITH STENT PLACEMENT  April 2012   LAD DES  . CORONARY ARTERY BYPASS GRAFT  1992   with LIMA to the LAD and diagonal.  . CYSTOSCOPY N/A 03/31/2019   Procedure: CYSTOSCOPY FLEXIBLE;  Surgeon: Lucas Mallow, MD;  Location: [facility];  Service: Urology;  Laterality: N/A;  . CYSTOSCOPY WITH URETHRAL DILATATION N/A 05/21/2019   Procedure: CYSTOSCOPY WITH URETHRAL BALLOON DILATATION;  Surgeon: Lucas Mallow, MD;  Location: [facility];  Service: Urology;  Laterality: N/A;  . ESOPHAGOGASTRODUODENOSCOPY (EGD) WITH PROPOFOL Left 09/08/2016   Procedure: ESOPHAGOGASTRODUODENOSCOPY (EGD) WITH PROPOFOL;  Surgeon: Ronnette Juniper, MD;  Location: [facility];  Service: Gastroenterology;  Laterality: Left;  . INGUINAL HERNIA REPAIR     "don't remeimber which side"  . LAPAROSCOPIC NEPHRECTOMY Right 03/31/2019   Procedure: RIGHT  HAND ASSIST LAPAROSCOPIC NEPHRECTOMY;  Surgeon: Lucas Mallow, MD;  Location: [facility];  Service: Urology;  Laterality: Right;  . PACEMAKER IMPLANT N/A 12/25/2016   Procedure: PACEMAKER IMPLANT;  Surgeon: Deboraha Sprang, MD;  Location: [facility];  Service: Cardiovascular;  Laterality: N/A;  . TONSILLECTOMY      Allergies Nitrostat [nitroglycerin]  Family History  Problem Relation Age of Onset  . Heart disease Father     Social History Social History   Tobacco Use  . Smoking status: Former Smoker    Packs/day: 1.00    Years: 30.00    Pack years: 30.00    Types: Cigarettes  . Smokeless tobacco: Never Used  . Tobacco comment: quit ~ 1985  Vaping Use  . Vaping Use: Never used  Substance Use Topics  . Alcohol use: Yes     Alcohol/week: 0.0 standard drinks    Comment: rare beer  . Drug use: No    Review of Systems  Constitutional: No fever/chills. Positive generalized weakness.  Eyes: No visual changes. ENT: No sore throat. Cardiovascular: Denies chest pain. Respiratory: Denies shortness of breath. Gastrointestinal: No abdominal pain.  No nausea, no vomiting.  No diarrhea.  No constipation. Genitourinary: Negative for dysuria. Musculoskeletal: Negative for back pain. Skin: Negative for rash. Neurological: Negative for headaches, focal weakness or numbness.  10-point ROS otherwise negative.  ____________________________________________   PHYSICAL EXAM:  VITAL SIGNS: Temp: 97.4 F RR: 17 SpO2: 100% RA Pulse: 73 BP: 138/64  Constitutional: Alert and oriented. Well appearing and in no acute distress. Eyes: Conjunctivae are somewhat pale.  Head: Atraumatic. Nose: No congestion/rhinnorhea. Mouth/Throat: Mucous membranes are moist.   Neck: No stridor.   Cardiovascular: Normal rate, regular rhythm.  Good peripheral circulation. Grossly normal heart sounds.   Respiratory: Normal respiratory effort.  No retractions. Lungs CTAB. Gastrointestinal: Soft and nontender. No distention.  Musculoskeletal: No lower extremity tenderness nor edema. No gross deformities of extremities. Neurologic:  Normal speech and language. No gross focal neurologic deficits are appreciated.  Skin:  Skin is warm, dry and intact. No rash noted.   ____________________________________________   LABS (all labs ordered are listed, but only abnormal results are displayed)  Labs Reviewed  COMPREHENSIVE METABOLIC PANEL - Abnormal; Notable for the following components:      Result Value   Potassium 5.2 (*)    CO2 21 (*)    Glucose, Bld 135 (*)    BUN 102 (*)    Creatinine, Ser 4.58 (*)    Total Protein 5.9 (*)    Albumin 3.2 (*)    GFR, Estimated 12 (*)    All other components within normal limits  CBC WITH  DIFFERENTIAL/PLATELET - Abnormal; Notable for the following components:   RBC 2.15 (*)    Hemoglobin 6.2 (*)    HCT 21.2 (*)    MCHC 29.2 (*)    RDW 22.9 (*)    Neutro Abs 8.0 (*)    Abs Immature Granulocytes 0.08 (*)    All other components within normal limits  PROTIME-INR - Abnormal; Notable for the following components:   Prothrombin Time 26.7 (*)    INR 2.6 (*)    All other components within normal limits  IRON AND TIBC - Abnormal; Notable for the following components:   Iron 39 (*)    TIBC 239 (*)    Saturation Ratios 16 (*)    All other components within normal limits  RETICULOCYTES - Abnormal; Notable for the following components:   Retic Ct Pct 5.2 (*)    RBC. 2.17 (*)    Immature Retic Fract 39.0 (*)    All other components within normal limits  CBC - Abnormal; Notable for the following components:   RBC 2.48 (*)    Hemoglobin 6.9 (*)    HCT 22.7 (*)    RDW 23.9 (*)    Platelets 144 (*)    All other components within normal limits  URINALYSIS, ROUTINE W REFLEX MICROSCOPIC - Abnormal; Notable for the following components:   APPearance HAZY (*)    Protein, ur 100 (*)    Leukocytes,Ua LARGE (*)    WBC, UA >50 (*)    All other components within normal limits  CBC - Abnormal; Notable for the following components:   RBC 2.84 (*)    Hemoglobin 7.8 (*)    HCT 25.8 (*)    RDW 23.1 (*)    Platelets 146 (*)    All other components within normal limits  BASIC METABOLIC PANEL - Abnormal; Notable for the following components:   CO2 21 (*)    Glucose, Bld 105 (*)    BUN 95 (*)    Creatinine, Ser 4.08 (*)    Calcium 8.8 (*)    GFR, Estimated 14 (*)    All other components within normal limits  POC OCCULT BLOOD, ED - Abnormal; Notable for the following components:   Fecal Occult Bld POSITIVE (*)    All other components within normal limits  RESPIRATORY PANEL BY RT PCR (FLU A&B, COVID)  VITAMIN B12  FOLATE  FERRITIN  CBC  TYPE AND SCREEN  PREPARE RBC (CROSSMATCH)    PREPARE RBC (CROSSMATCH)   ____________________________________________  EKG   EKG Interpretation  Date/Time:  Friday November 14 2019 12:44:35 EDT Ventricular Rate:  73 PR Interval:    QRS Duration: 195 QT Interval:  491 QTC Calculation: 542 R Axis:   -32 Text Interpretation: V-paced rhythm. Confirmed by Nanda Quinton [phone]) on 11/14/2019 12:53:02 PM       ____________________________________________  RADIOLOGY  None  ____________________________________________   PROCEDURES  Procedure(s) performed:   Procedures  CRITICAL CARE Performed by: Margette Fast Total critical care time: 35 minutes Critical care time was exclusive of separately billable procedures and treating other patients. Critical care was necessary to treat or prevent imminent or life-threatening deterioration. Critical care was time spent personally by me on the following activities: development of treatment plan with patient and/or surrogate as well as nursing, discussions with consultants, evaluation of patient's response to treatment, examination of patient, obtaining history from patient or surrogate, ordering and performing treatments and interventions, ordering and review of laboratory studies, ordering and review of radiographic studies, pulse oximetry and re-evaluation of patient's condition.  Nanda Quinton, MD Emergency Medicine  ____________________________________________   INITIAL IMPRESSION / ASSESSMENT AND PLAN / ED COURSE  Pertinent labs & imaging results that were available during my care of the patient were reviewed by me and considered in my medical decision making (see chart for details).   Patient presents to the emergency department with worsening symptomatic anemia.  He is followed by hematology and anemia is thought to be related primarily to CKD.  Cannot rule out intermittent GI bleeding but no symptoms to suspect this currently.  Plan to confirm labs here but suspect that  outpatient labs are accurate given patient's worsening symptoms in the past 2 days.  He is agreeable to receive blood transfusion after risk/benefit discussion at bedside.  EKG showing V paced rhythm.   H/H resulted showed low Hb. Patient also with elevated Cr. Ordered PRBC.   Discussed patient's case with [facility], Dr. Lorin Mercy to request admission. Patient and family (if present) updated with plan. Care transferred to [facility] service.  I reviewed all nursing notes, vitals, pertinent old records, EKGs, labs, imaging (as available).  ____________________________________________  FINAL CLINICAL IMPRESSION(S) / ED DIAGNOSES  Final diagnoses:  Symptomatic anemia     MEDICATIONS GIVEN DURING THIS VISIT:  Medications  0.9 %  sodium chloride infusion (has no administration in time range)  allopurinol (ZYLOPRIM) tablet 300 mg (has no administration in time range)  amLODipine (NORVASC) tablet 5 mg (has no administration in time range)  atorvastatin (LIPITOR) tablet 20 mg (has no administration in time range)  tamsulosin (FLOMAX) capsule 0.4 mg (has no administration in time range)  lactated ringers infusion ( Intravenous Restarted 11/15/19 0027)  hydrALAZINE (APRESOLINE) injection 5 mg (has no administration in time range)  acetaminophen (TYLENOL) tablet 650 mg (has no administration in time range)    Or  acetaminophen (TYLENOL) suppository 650 mg (has no administration in time range)  zolpidem (AMBIEN) tablet 5 mg (has no administration in time range)  sorbitol 70 % solution 30 mL (has no administration in time range)  docusate sodium (ENEMEEZ) enema 283 mg (has no administration in time range)  ondansetron (ZOFRAN) tablet 4 mg (has no administration in time range)    Or  ondansetron (ZOFRAN) injection 4 mg (has no administration in time range)  camphor-menthol (SARNA) lotion 1 application (has no administration in time range)    And  hydrOXYzine (ATARAX/VISTARIL) tablet 25 mg (has no  administration in time range)  calcium carbonate (dosed in mg elemental calcium) suspension  500 mg of elemental calcium (has no administration in time range)  feeding supplement (NEPRO CARB STEADY) liquid 237 mL (has no administration in time range)  pantoprazole (PROTONIX) injection 40 mg (40 mg Intravenous Given 11/14/19 2128)  pantoprazole (PROTONIX) 80 mg in sodium chloride 0.9 % 100 mL IVPB (0 mg Intravenous Stopped 11/14/19 1546)  0.9 %  sodium chloride infusion (Manually program via Guardrails IV Fluids) ( Intravenous New Bag/Given 11/14/19 2128)    Note:  This document was prepared using Dragon voice recognition software and may include unintentional dictation errors.  Nanda Quinton, MD, [facility] Emergency Medicine    Kiyoto Slomski, [facility], MD 11/15/19 [phone]

## 2019-11-14 NOTE — Consult Note (Signed)
Nephrology Consult   Requesting provider: Karmen Bongo Service requesting consult: Hospitalist Reason for consult: AKI on CKD   Assessment/Recommendations: Paul Price is a/an 79 y.o. male with a past medical history OSA, HTN, CKD, HLD, afib, CAD, history of nephrectomy  who present w/ anemia  Nonoliguric AKI on CKD: Baseline kidney dysfunction likely related comorbidities as well as nephrectomy about 7 months ago.  Baseline creatinine is around 3-3.5.  Creatinine now 4.6 likely associated with hypotension, anemia, dehydration -Would provide with MIVFs after transfusion -Anemia mgmt as below -Would hold Lasix for now, could use if hypoxia occurs with transfusion -No overt signs of uremia -Continue to monitor daily Cr, Dose meds for GFR -Monitor Daily I/Os, Daily weight  -Maintain MAP>65 for optimal renal perfusion.  -Avoid nephrotoxic medications including NSAIDs and Vanc/Zosyn combo -Obtain renal ultrasound -Currently no indication for HD  Volume Status: Euvolemic to mildly dehydrated.  Would hold Lasix.  Transfusions as below  Anemia secondary to blood loss and chronic kidney disease: Suspicious for upper GI bleed.  In the setting of being on Xarelto with significant decrease in his GFR likely contributing. -GI to evaluate, appreciate assistance -On Protonix -Transfusions per primary team with goal hemoglobin greater than 7 -We will hold IV Lasix that was ordered due to concern for some degree of dehydration -Will arrange for Aranesp if the patient remains in the hospital  Hypertension: History of such with recent hypotension -Hold Lasix and metoprolol in the setting of concern for active GI bleeding -Can continue amlodipine 5 mg daily but hold for hypotension   Atrial fibrillation: On Xarelto.  Suboptimal medication given [name].  Would hold at this time and if anticoagulation is needed in the distant future would consider Eliquis instead.   Recommendations  conveyed to primary service.    [facility] 11/14/2019 4:19 PM   _____________________________________________________________________________________ CC: anemia  History of Present Illness: Paul Price is a/an 79 y.o. male with a past medical history of OSA, HTN, CKD, HLD, afib, CAD, history of nephrectomy who presents with symptomatic anemia.  Patient states he has a history of GI bleeding in 2018 with unremarkable EGD at that time.  Patient states for the past few days he has been feeling very tired and noted that his blood pressure is fairly low.  He continued to feel this way with some intermittent dizziness.  He called his PCP and had labs checked demonstrating a hemoglobin of 5.9.  His creatinine was also elevated above his baseline.  It was recommended that he come to the emergency department for further evaluation.  He follows with Dr. Marval Regal at [facility].  There have been plans to prepare him for dialysis but he has not had a fistula placed at this time.  He had a nephrectomy about 7 months ago.  Recently his Lasix was increased from 20 to 40 mg daily.  He receives EPO injections every 2 weeks.  He denies any abdominal pain, NSAID use, bloody stools, hematuria.  He is taking Xarelto.  In the emergency department he was found to have a hemoglobin of 6.2.  Heme positive stool.  GI has been consulted.  Creatinine is 4.6.   Medications:  Current Facility-Administered Medications  Medication Dose Route Frequency Provider Last Rate Last Admin  . 0.9 %  sodium chloride infusion  10 mL/hr Intravenous Once Karmen Bongo, MD      . acetaminophen (TYLENOL) tablet 650 mg  650 mg Oral Q6H PRN Lorin Mercy,  Anderson Malta, MD       Or  . acetaminophen (TYLENOL) suppository 650 mg  650 mg Rectal Q6H PRN Karmen Bongo, MD      . Derrill Memo ON 11/15/2019] allopurinol (ZYLOPRIM) tablet 300 mg  300 mg Oral Daily Karmen Bongo, MD      . Derrill Memo ON 11/15/2019] amLODipine (NORVASC)  tablet 5 mg  5 mg Oral Daily Karmen Bongo, MD      . Derrill Memo ON 11/15/2019] atorvastatin (LIPITOR) tablet 20 mg  20 mg Oral Daily Karmen Bongo, MD      . calcium carbonate (dosed in mg elemental calcium) suspension 500 mg of elemental calcium  500 mg of elemental calcium Oral Q6H PRN Karmen Bongo, MD      . camphor-menthol [facility]) lotion 1 application  1 application Topical L7L PRN Karmen Bongo, MD       And  . hydrOXYzine (ATARAX/VISTARIL) tablet 25 mg  25 mg Oral Q8H PRN Karmen Bongo, MD      . docusate sodium [facility]) enema 283 mg  1 enema Rectal PRN Karmen Bongo, MD      . feeding supplement (NEPRO CARB STEADY) liquid 237 mL  237 mL Oral TID PRN Karmen Bongo, MD      . furosemide (LASIX) injection 20 mg  20 mg Intravenous Once Karmen Bongo, MD      . hydrALAZINE (APRESOLINE) injection 5 mg  5 mg Intravenous Q4H PRN Karmen Bongo, MD      . lactated ringers infusion   Intravenous Continuous Karmen Bongo, MD      . Derrill Memo ON 11/15/2019] metoprolol succinate (TOPROL-XL) 24 hr tablet 25 mg  25 mg Oral Daily Karmen Bongo, MD      . ondansetron [facility]) tablet 4 mg  4 mg Oral Q6H PRN Karmen Bongo, MD       Or  . ondansetron [facility]) injection 4 mg  4 mg Intravenous Q6H PRN Karmen Bongo, MD      . pantoprazole (PROTONIX) injection 40 mg  40 mg Intravenous Lillia Mountain, MD      . sorbitol 70 % solution 30 mL  30 mL Oral PRN Karmen Bongo, MD      . Derrill Memo ON 11/15/2019] tamsulosin (FLOMAX) capsule 0.4 mg  0.4 mg Oral Daily Karmen Bongo, MD      . zolpidem Lorrin Mais) tablet 5 mg  5 mg Oral QHS PRN Karmen Bongo, MD         ALLERGIES Nitrostat [nitroglycerin]  MEDICAL HISTORY Past Medical History:  Diagnosis Date  . Arthritis    "shoulders" (09/07/2016)  . Atrial fibrillation ([place])   . BPH (benign prostatic hyperplasia)   . Chronic kidney disease (CKD), stage III (moderate) (HCC)   . Coronary artery disease   . DDD (degenerative disc disease),  cervical   . DVT (deep venous thrombosis) ([place]) 12/2013   LLE  . GERD (gastroesophageal reflux disease)   . Gout   . High cholesterol   . History of blood transfusion 09/06/2016   2 u PRBC  . History of scarlet fever 1940s  . History of stress test 06/2011   No significant ischemia, this is a low risk scan. Clinical correlation recommended Abnormal myocardial perfusion study.  Marland Kitchen Hx of echocardiogram 05/2009   EF 40-45%, he did have mild annular calcification with mild-to-moderate MR and mild TR as well as aortic valve sclerosis. Estimated RV systolic pressure was 21 mm.  . Hypertension   . Iron deficiency anemia   . Kidney failure   .  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 Neck pain    "not chronic" (09/07/2016)  . Obesity   . OSA on CPAP   . RBBB   . Renal insufficiency   . Right renal mass   . Spinal stenosis of lumbar region      SOCIAL HISTORY Social History   Socioeconomic History  . Marital status: Married    Spouse name: Not on file  . Number of children: Not on file  . Years of education: Not on file  . Highest education level: Not on file  Occupational History  . Not on file  Tobacco Use  . Smoking status: Former Smoker    Packs/day: 1.00    Years: 30.00    Pack years: 30.00    Types: Cigarettes  . Smokeless tobacco: Never Used  . Tobacco comment: quit ~ 1985  Vaping Use  . Vaping Use: Never used  Substance and Sexual Activity  . Alcohol use: Yes    Alcohol/week: 0.0 standard drinks    Comment: rare beer  . Drug use: No  . Sexual activity: Not on file  Other Topics Concern  . Not on file  Social History Narrative  . Not on file   Social Determinants of Health   Financial Resource Strain:   . Difficulty of Paying Living Expenses: Not on file  Food Insecurity:   . Worried About Charity fundraiser in the Last Year: Not on file  . Ran Out of Food in the Last Year: Not on file  Transportation Needs:   . Lack of Transportation (Medical):  Not on file  . Lack of Transportation (Non-Medical): Not on file  Physical Activity:   . Days of Exercise per Week: Not on file  . Minutes of Exercise per Session: Not on file  Stress:   . Feeling of Stress : Not on file  Social Connections:   . Frequency of Communication with Friends and Family: Not on file  . Frequency of Social Gatherings with Friends and Family: Not on file  . Attends Religious Services: Not on file  . Active Member of Clubs or Organizations: Not on file  . Attends Archivist Meetings: Not on file  . Marital Status: Not on file  Intimate Partner Violence:   . Fear of Current or Ex-Partner: Not on file  . Emotionally Abused: Not on file  . Physically Abused: Not on file  . Sexually Abused: Not on file     FAMILY HISTORY Family History  Problem Relation Age of Onset  . Heart disease Father       Review of Systems: 12 systems reviewed Otherwise as per HPI, all other systems reviewed and negative  Physical Exam: Vitals:   11/14/19 1545 11/14/19 1600  BP: (!) 145/63 (!) 141/56  Pulse: 73 68  Resp: 14   Temp:  (!) 97.4 F (36.3 C)  SpO2: 100% 100%   No intake/output data recorded. No intake or output data in the 24 hours ending 11/14/19 1619 General: well-appearing, no acute distress HEENT: anicteric sclera, oropharynx clear without lesions CV: Normal rate, no murmurs, no edema Lungs: clear to auscultation bilaterally, normal work of breathing Abd: soft, non-tender, non-distended Skin: no visible lesions or rashes Psych: alert, engaged, appropriate mood and affect Musculoskeletal: no obvious deformities Neuro: normal speech, no gross focal deficits   Test Results Reviewed Lab Results  Component Value Date   NA 140 11/14/2019   K 5.2 (H) 11/14/2019  CL 107 11/14/2019   CO2 21 (L) 11/14/2019   BUN 102 (H) 11/14/2019   CREATININE 4.58 (H) 11/14/2019   CALCIUM 8.9 11/14/2019   ALBUMIN 3.2 (L) 11/14/2019   PHOS 2.8 11/04/2019      I have reviewed all relevant outside healthcare records related to the patient's current hospitalization

## 2019-11-14 NOTE — H&P (Signed)
History and Physical    ZUHAIR LARICCIA Price:[id number] DOB: 12/01/1940 DOA: 11/14/2019  PCP: [name], MD Consultants:  Irene Limbo - oncology; Kelly/Klein - cardiology; [place] - nephrology; [place] - vascular surgery; [place] - urology; [place] - GI Patient coming from:  Home - lives with wife; NOK: Wife, [phone]  Chief Complaint: Symptomatic anemia  HPI: Paul Price is a 79 y.o. 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  He was last admitted for GI bleeding in 08/2016 with really unremarkable EGD with scant gastritis; colonoscopy with diverticulosis and hemorrhoids without apparent bleeding.  He reports that 2 days ago he was feeling weak and checked his BP and it read 90/40.  He took it easy but didn't have any energy.  He checked his BP again today, 90/45.  He still felt tired and so called his PCP and he was seen in the office today.  His Hgb was low, 5.9.  His kidney function was also worse than usual.  He is on Lasix for fluid and he has been having edema; he was increased form 20 to 40 mg about 2 weeks ago.  He has not noticed any blood in his stools.  He has not been on dialysis in the past.  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  His other kidney has not been doing well.  He is coming in every other week for epo injections, due Tuesday.  He is due to see vascular soon.  He has also had a few iron infusions.  No abdominal pain.    ED Course:  CKD with lots of doctors, getting prepared for HD.  10/26 - Hgb 9.6, now 6.2.  Dark stool, heme positive, giving Protonix.  Likely anemia + GI bleed.  Gi consult pending, likely needs EGD.  Review of Systems: As per HPI; otherwise review of systems reviewed and negative.   Ambulatory Status:  Ambulates without assistance  COVID  Vaccine Status:  Complete  Past Medical History:  Diagnosis Date  . Arthritis    "shoulders" (09/07/2016)  . Atrial fibrillation ([place])   . BPH (benign prostatic hyperplasia)   . Chronic kidney disease (CKD), stage III (moderate) (HCC)   . Coronary artery disease   . DDD (degenerative disc disease), cervical   . DVT (deep venous thrombosis) ([place]) 12/2013   LLE  . GERD (gastroesophageal reflux disease)   . Gout   . High cholesterol   . History of blood transfusion 09/06/2016   2 u PRBC  . History of scarlet fever 1940s  . History of stress test 06/2011   No significant ischemia, this is a low risk scan. Clinical correlation recommended Abnormal myocardial perfusion study.  Marland Kitchen Hx of echocardiogram 05/2009   EF 40-45%, he did have mild annular calcification with mild-to-moderate MR and mild TR as well as aortic valve sclerosis. Estimated RV systolic pressure was 21 mm.  . Hypertension   . Iron deficiency anemia   . Kidney failure   . 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 Neck pain    "not chronic" (09/07/2016)  . Obesity   . OSA on CPAP   . RBBB   . Renal insufficiency   . Right renal mass   . Spinal stenosis of lumbar region  Past Surgical History:  Procedure Laterality Date  . APPENDECTOMY    . CARDIAC CATHETERIZATION  2009   he was found to have a atretic LIMA graft to his LAD and had a patent vein graft supplying his diagonal vessel. At that time, he had 70% narrowing in his LAD beyond a diagonal vessel which was initially treated medically.  . COLONOSCOPY WITH PROPOFOL Left 09/09/2016   Procedure: COLONOSCOPY WITH PROPOFOL;  Surgeon: Arta Silence, MD;  Location: [facility];  Service: Endoscopy;  Laterality: Left;  . CORONARY ANGIOPLASTY WITH STENT PLACEMENT  April 2012   LAD DES  . CORONARY ARTERY BYPASS GRAFT  1992   with LIMA to the LAD and diagonal.  . CYSTOSCOPY N/A 03/31/2019   Procedure: CYSTOSCOPY FLEXIBLE;  Surgeon: Lucas Mallow, MD;   Location: [facility];  Service: Urology;  Laterality: N/A;  . CYSTOSCOPY WITH URETHRAL DILATATION N/A 05/21/2019   Procedure: CYSTOSCOPY WITH URETHRAL BALLOON DILATATION;  Surgeon: Lucas Mallow, MD;  Location: [facility];  Service: Urology;  Laterality: N/A;  . ESOPHAGOGASTRODUODENOSCOPY (EGD) WITH PROPOFOL Left 09/08/2016   Procedure: ESOPHAGOGASTRODUODENOSCOPY (EGD) WITH PROPOFOL;  Surgeon: Ronnette Juniper, MD;  Location: [facility] [place];  Service: Gastroenterology;  Laterality: Left;  . INGUINAL HERNIA REPAIR     "don't remeimber which side"  . LAPAROSCOPIC NEPHRECTOMY Right 03/31/2019   Procedure: RIGHT  HAND ASSIST LAPAROSCOPIC NEPHRECTOMY;  Surgeon: Lucas Mallow, MD;  Location: [facility];  Service: Urology;  Laterality: Right;  . PACEMAKER IMPLANT N/A 12/25/2016   Procedure: PACEMAKER IMPLANT;  Surgeon: Deboraha Sprang, MD;  Location: [facility];  Service: Cardiovascular;  Laterality: N/A;  . TONSILLECTOMY      Social History   Socioeconomic History  . Marital status: Married    Spouse name: Not on file  . Number of children: Not on file  . Years of education: Not on file  . Highest education level: Not on file  Occupational History  . Not on file  Tobacco Use  . Smoking status: Former Smoker    Packs/day: 1.00    Years: 30.00    Pack years: 30.00    Types: Cigarettes  . Smokeless tobacco: Never Used  . Tobacco comment: quit ~ 1985  Vaping Use  . Vaping Use: Never used  Substance and Sexual Activity  . Alcohol use: Yes    Alcohol/week: 0.0 standard drinks    Comment: rare beer  . Drug use: No  . Sexual activity: Not on file  Other Topics Concern  . Not on file  Social History Narrative  . Not on file   Social Determinants of Health   Financial Resource Strain:   . Difficulty of Paying Living Expenses: Not on file  Food Insecurity:   . Worried About Charity fundraiser in the Last Year: Not on file  . Ran Out of Food in the Last Year: Not on file    Transportation Needs:   . Lack of Transportation (Medical): Not on file  . Lack of Transportation (Non-Medical): Not on file  Physical Activity:   . Days of Exercise per Week: Not on file  . Minutes of Exercise per Session: Not on file  Stress:   . Feeling of Stress : Not on file  Social Connections:   . Frequency of Communication with Friends and Family: Not on file  . Frequency of Social Gatherings with Friends and Family: Not on file  . Attends Religious Services: Not on file  .  Active Member of Clubs or Organizations: Not on file  . Attends Archivist Meetings: Not on file  . Marital Status: Not on file  Intimate Partner Violence:   . Fear of Current or Ex-Partner: Not on file  . Emotionally Abused: Not on file  . Physically Abused: Not on file  . Sexually Abused: Not on file    Allergies  Allergen Reactions  . Nitrostat [Nitroglycerin] Other (See Comments)    Causes blood pressure to "bottom out"    Family History  Problem Relation Age of Onset  . Heart disease Father     Prior to Admission medications   Medication Sig Start Date End Date Taking? Authorizing Provider  acetaminophen (TYLENOL) 500 MG tablet Take 1,000 mg by mouth every 6 (six) hours as needed for moderate pain.     [provider]  allopurinol (ZYLOPRIM) 300 MG tablet Take 300 mg by mouth daily.    [provider]  atorvastatin (LIPITOR) 20 MG tablet TAKE 1 TABLET BY MOUTH DAILY 09/08/19   Troy Sine, MD  b complex vitamins tablet Take 1 tablet by mouth daily.    [provider]  fenofibrate micronized (LOFIBRA) 134 MG capsule Take 134 mg by mouth daily before breakfast.    [provider]  furosemide (LASIX) 20 MG tablet Take 10-20 mg by mouth See admin instructions. 20 mg on even days, 10 mg on odd days 04/18/19   [provider]  furosemide (LASIX) 40 MG tablet Take 40 mg by mouth daily. 10/23/19   [provider]  metoprolol succinate  (TOPROL-XL) 25 MG 24 hr tablet TAKE 1 TABLET BY MOUTH DAILY Patient taking differently: Take 25 mg by mouth daily.  12/25/18   Troy Sine, MD  omeprazole (PRILOSEC) 20 MG capsule Take 20 mg by mouth at bedtime.     [provider]  Rivaroxaban (XARELTO) 15 MG TABS tablet Take 1 tablet (15 mg total) by mouth daily with supper. 09/10/16   Velvet Bathe, MD  Tamsulosin HCl (FLOMAX) 0.4 MG CAPS Take 1 capsule (0.4 mg total) by mouth daily. 07/20/11   Chauncy Passy, MD    Physical Exam: Vitals:   11/14/19 1500 11/14/19 1515 11/14/19 1530 11/14/19 1545  BP: (!) 136/56 (!) 138/59 138/72 (!) 145/63  Pulse: 66 66 74 73  Resp: 12 13 13 14   Temp:      TempSrc:      SpO2: 100% 100% 100% 100%  Weight:      Height:         . General:  Appears calm and comfortable and is NAD . Eyes:  PERRL, EOMI, normal lids, iris . ENT:  grossly normal hearing, lips & tongue, mmm; appropriate dentition . Neck:  no LAD, masses or thyromegaly . Cardiovascular:  RRR, no m/r/g. No LE edema.  Marland Kitchen Respiratory:   CTA bilaterally with no wheezes/rales/rhonchi.  Normal respiratory effort. . Abdomen:  soft, NT, ND, NABS . Skin:  no rash or induration seen on limited exam . Musculoskeletal:  grossly normal tone BUE/BLE, good ROM, no bony abnormality . Psychiatric:  grossly normal mood and affect, speech fluent and appropriate, AOx3 . Neurologic:  CN 2-12 grossly intact, moves all extremities in coordinated fashion    Radiological Exams on Admission: Independently reviewed - see discussion in A/P where applicable  No results found.  EKG: Independently reviewed.  Ventricularly paced with rate 73    Labs on Admission: I have personally reviewed the available labs  and imaging studies at the time of the admission.  Pertinent labs:   K+ 5.2 CO2 21 Glucose 135 BUN 102/Creatinine 4.58/GFR 12; 53/3.24/18 on 10/26 Albumin 3.2 Iron 39 Ferritin 16 WBC 9.7 Hgb 6.2; 9.2 on 10/26 MCV 98.6 INR 2.6 Heme  positive   Assessment/Plan Principal Problem:   Symptomatic anemia Active Problems:   CAD - CABG '92, LAD DES 4/12, low risk Myoview 6/13   HTN (hypertension)   Hyperlipidemia with target LDL less than 70   OSA on CPAP   PAF (paroxysmal atrial fibrillation) (HCC)   Chronic diastolic (congestive) heart failure (HCC)   Upper GI bleeding   Stage 5 chronic kidney disease (HCC)    Symptomatic anemia -Patient with prior remote history (2018) for GI bleeding, thought to be related to gastritis; he required transfusion during that admission -He also has stage 5 CKD, for which he receives twice weekly Epo infusions as well as periodic iron infusions -He is not presenting with fatigue and generalized weakness; found to have a substantial decrease in Hgb since 10/26 -MCV is normal and so is ferritin -Appears to be primarily ABLA with superimposed anemia of chronic disease associated with renal disease -Heme testing was positive -No epigastric pain or TTP but still likely upper GI source of bleeding given black stools that are heme positive -Will admit to tele -Transfuse 2 units PRBC to start and recheck Hgb afterwards.  -Will give Lasix between units due to h/o CHF (but hold Lasix otherwise - see below) -Monitor closely and follow cbc q12h, transfuse as necessary for Hbg <8 due to h/o cardiac disease  Upper GI Bleeding -Patient' is presenting with melena and symptomatic anemia, suggestive of upper GI bleeding. -Patient has history of remote gastritis, denies use of NSAIDs -Most likely diagnosis is gastric or duodenal ulcer, esophagitis or gastritis -The patient is not tachycardic with normal blood pressure, suggesting subacute volume loss.  -GI consulted by ED, will follow up recommendations -NPO for possible EGD; suspect that he will be ok for clears today with plan for EGD in AM but waiting on consult currently. -LR at 100 mL/hr -Start IV pantoprazole 40 BID for patients with ongoing  bleeding who need urgent EGD; if no EGD is planned it may be reasonable to switch to oral 40 mg PO BID PPI -Consider one-time dose of prokinetic (erythromycin 250 mg IV vs. Azithromycin 500 mg IV) to improve gastric clearance prior to EGD, as this may decrease the need for a 2nd endoscopy -Zofran IV for nausea -Avoid NSAIDs and SQ heparin -Maintain IV access (2 large bore IVs if possible).  Stage 5 CKD -Patient has advanced CKD at baseline -He was previously evaluated for PD and was determined to not be a candidate -He is scheduled to see vascular in early December to consider AV fistula placement in anticipation of future need for HD -Creatinine is currently increased from 3.24 -> 4.58; this may be associated with volume depletion and/or recent increase in Lasix dosing -However, BUN has essentially doubled from 53 -> 102, further indicating probable upper GI blood loss -He is s/p R nephrectomy due to [place] about 7 months ago -Hopefully his renal function will improve with blood products and cessation of GI bleeding -Nephrology has been consulted and Dr. Joylene Grapes will see  CAD -He is s/p CABG -He is not on ASA due to [facility] -Currently without c/o CP; if this develops, he will need a higher Hgb goal  Chronic combined CHF -His Lasix was increased from  20 to 40 mg daily recently -Will hold Lasix for now given concern for worsening renal function and possible overdiuresis -No current concern for decompensation -Last echo was in 2018 and showed EF 40-45% with prior h/o grade 2 diastolic dysfunction  HTN -There was some confusion about his Norvasc dosing and he may have been taking either 2.5 mg daily or 10 mg daily; for now, will go with 5 mg daily and follow -Continue Toprol XL  HLD -Continue Lipitor -Hold Lofibra due to limited inpatient utility  Afib  -He is rate controlled with Toprol XL and also ventricularly paced -Hold Xarelto due to GI bleeding  OSA on CPAP -Continue  CPAP     Note: This patient has been tested and is pending for the novel coronavirus COVID-19. She has been fully vaccinated against COVID-19.     DVT prophylaxis: SCDs Code Status:  DNR - confirmed with patient Family Communication: None present Disposition Plan:  The patient is from: home  Anticipated d/c is to: home without [facility] services   Anticipated d/c date will depend on clinical response to treatment, but likely 2-3 days  Patient is currently: acutely ill Consults called: GI; nephrology Admission status:Admit - It is my clinical opinion that admission to INPATIENT is reasonable and necessary because of the expectation that this patient will require hospital care that crosses at least 2 midnights to treat this condition based on the medical complexity of the problems presented.  Given the aforementioned information, the predictability of an adverse outcome is felt to be significant.      Karmen Bongo MD [facility]   How to contact the [facility] Attending or Consulting provider [place] or covering provider during after hours [place], for this patient?  1. Check the care team in [facility] and look for a) attending/consulting [facility] provider listed and b) the [facility] team listed 2. Log into [email or web address] and use [facility] universal password to access. If you do not have the password, please contact the hospital operator. 3. Locate the [facility] provider you are looking for under [facility] and page to a number that you can be directly reached. 4. If you still have difficulty reaching the provider, please page the [facility] (Director on Call) for the Hospitalists listed on amion for assistance.   11/14/2019, 4:01 PM

## 2019-11-14 NOTE — ED Notes (Signed)
Date and time results received: 11/14/19 1420 (use smartphrase ".now" to insert current time)  Test: Hgb Critical Value: 6.2  Name of Provider Notified: Dr. Laverta Baltimore  Orders Received? Or Actions Taken

## 2019-11-14 NOTE — Progress Notes (Signed)
CRITICAL VALUE ALERT  Critical Value:  Hgb 6.9 Date & Time Notied: 11/14/19 @2141   Provider Notified: n/a  Orders Received/Actions taken: patient had finished 1 unit awaiting for the second unit when Phlebotomist came and drew blood. Results therefore is true but does not reflect pos transfusion Hgb. Pat receiving 2nd unit now.

## 2019-11-14 NOTE — ED Triage Notes (Signed)
Pt sent to ED from Dr. Gabriel Carina after blood work resulted Hgb 5.9. Pt is "lightheaded when I stand up"

## 2019-11-15 DIAGNOSIS — D649 Anemia, unspecified: Secondary | ICD-10-CM

## 2019-11-15 LAB — BPAM RBC
Blood Product Expiration Date: 202112072359
Blood Product Expiration Date: 202112082359
ISSUE DATE / TIME: 202111051625
ISSUE DATE / TIME: 202111052119
Unit Type and Rh: 5100
Unit Type and Rh: 5100

## 2019-11-15 LAB — URINALYSIS, ROUTINE W REFLEX MICROSCOPIC
Bacteria, UA: NONE SEEN
Bilirubin Urine: NEGATIVE
Glucose, UA: NEGATIVE mg/dL
Hgb urine dipstick: NEGATIVE
Ketones, ur: NEGATIVE mg/dL
Nitrite: NEGATIVE
Protein, ur: 100 mg/dL — AB
Specific Gravity, Urine: 1.012 (ref 1.005–1.030)
WBC, UA: >50 WBC/hpf — ABNORMAL HIGH (ref 0–5)
pH: 6 (ref 5.0–8.0)

## 2019-11-15 LAB — TYPE AND SCREEN
ABO/RH(D): O POS
Antibody Screen: NEGATIVE
Unit division: 0
Unit division: 0

## 2019-11-15 LAB — CBC
HCT: 25.8 % — ABNORMAL LOW (ref 39.0–52.0)
HCT: 25.5 % — ABNORMAL LOW (ref 39.0–52.0)
Hemoglobin: 7.8 g/dL — ABNORMAL LOW (ref 13.0–17.0)
Hemoglobin: 8 g/dL — ABNORMAL LOW (ref 13.0–17.0)
MCH: 27.5 pg (ref 26.0–34.0)
MCH: 28.8 pg (ref 26.0–34.0)
MCHC: 30.2 g/dL (ref 30.0–36.0)
MCHC: 31.4 g/dL (ref 30.0–36.0)
MCV: 90.8 fL (ref 80.0–100.0)
MCV: 91.7 fL (ref 80.0–100.0)
Platelets: 146 10*3/uL — ABNORMAL LOW (ref 150–400)
Platelets: 141 10*3/uL — ABNORMAL LOW (ref 150–400)
RBC: 2.84 MIL/uL — ABNORMAL LOW (ref 4.22–5.81)
RBC: 2.78 MIL/uL — ABNORMAL LOW (ref 4.22–5.81)
RDW: 23.1 % — ABNORMAL HIGH (ref 11.5–15.5)
RDW: 22.7 % — ABNORMAL HIGH (ref 11.5–15.5)
WBC: 10.1 10*3/uL (ref 4.0–10.5)
WBC: 9.2 10*3/uL (ref 4.0–10.5)
nRBC: 0 % (ref 0.0–0.2)
nRBC: 0.2 % (ref 0.0–0.2)

## 2019-11-15 LAB — BASIC METABOLIC PANEL
Anion gap: 11 (ref 5–15)
BUN: 95 mg/dL — ABNORMAL HIGH (ref 8–23)
CO2: 21 mmol/L — ABNORMAL LOW (ref 22–32)
Calcium: 8.8 mg/dL — ABNORMAL LOW (ref 8.9–10.3)
Chloride: 111 mmol/L (ref 98–111)
Creatinine, Ser: 4.08 mg/dL — ABNORMAL HIGH (ref 0.61–1.24)
GFR, Estimated: 14 mL/min — ABNORMAL LOW (ref >60)
Glucose, Bld: 105 mg/dL — ABNORMAL HIGH (ref 70–99)
Potassium: 4.6 mmol/L (ref 3.5–5.1)
Sodium: 143 mmol/L (ref 135–145)

## 2019-11-15 MED ORDER — SODIUM CHLORIDE 0.9 % IV SOLN: INTRAVENOUS | Status: DC

## 2019-11-15 MED ORDER — ALLOPURINOL 100 MG PO TABS
200.0000 mg | ORAL_TABLET | Freq: Every day | ORAL | Status: DC
  Administered 2019-11-16 – 2019-11-17 (×2): 200 mg via ORAL
  Filled 2019-11-15 (×2): qty 2

## 2019-11-15 NOTE — Progress Notes (Signed)
Pt refusing CPAP for tonight 

## 2019-11-15 NOTE — Progress Notes (Signed)
Nephrology Follow-Up Consult note   Assessment/Recommendations: Paul Price is a/an 79 y.o. male with a past medical history significant for OSA, HTN, CKD, HLD, afib, CAD, history of nephrectomy  who present w/ anemia  Nonoliguric AKI on CKD: Baseline kidney dysfunction likely related comorbidities as well as nephrectomy about 7 months ago.  Baseline creatinine is around 3-3.5.  Creatinine improved to 4.1 today with correction of anemia and hydration -Continue MIVFs when NPO -Anemia mgmt as below -Hold lasix -No overt signs of uremia -No indication for dialysis -Continue to monitor daily Cr, Dose meds for GFR -Monitor Daily I/Os, Daily weight  -Maintain MAP>65 for optimal renal perfusion.  -Avoid nephrotoxic medications including NSAIDs and Vanc/Zosyn combo  Anemia secondary to blood loss and chronic kidney disease: Suspicious for upper GI bleed.  In the setting of being on Xarelto with significant decrease in his GFR likely contributing. S/p 2 units of PRBCs -GI to evaluate, appreciate assistance -On Protonix -Transfusions per primary team with goal hemoglobin greater than 7 -We will hold IV Lasix that was ordered due to concern for some degree of dehydration -Will arrange for Aranesp if the patient remains in the hospital next week  Hypertension: History of such with recent hypotension -Hold Lasix and metoprolol in the setting of concern for active GI bleeding -Can continue amlodipine 5 mg daily but hold for hypotension -Restart meds prn  Atrial fibrillation: On Xarelto.  Suboptimal medication given [name].  Would hold at this time and if anticoagulation is needed in the distant future would consider Eliquis instead.  We will continue to follow tomorrow. If Crt continues to improve will likely sign off.  Recommendations conveyed to primary service.    [facility] 11/15/2019 9:55  AM  ___________________________________________________________  CC: Anemia  Interval History/Subjective: Patient states he feels fine today.  Creatinine is improving to 4.1.  Anemia persists   Medications:  Current Facility-Administered Medications  Medication Dose Route Frequency Provider Last Rate Last Admin  . 0.9 %  sodium chloride infusion  10 mL/hr Intravenous Once Karmen Bongo, MD      . acetaminophen (TYLENOL) tablet 650 mg  650 mg Oral Q6H PRN Karmen Bongo, MD       Or  . acetaminophen (TYLENOL) suppository 650 mg  650 mg Rectal Q6H PRN Karmen Bongo, MD      . allopurinol (ZYLOPRIM) tablet 300 mg  300 mg Oral Daily Karmen Bongo, MD   300 mg at 11/15/19 0902  . amLODipine (NORVASC) tablet 5 mg  5 mg Oral Daily Karmen Bongo, MD   5 mg at 11/15/19 0902  . atorvastatin (LIPITOR) tablet 20 mg  20 mg Oral Daily Karmen Bongo, MD   20 mg at 11/15/19 0902  . calcium carbonate (dosed in mg elemental calcium) suspension 500 mg of elemental calcium  500 mg of elemental calcium Oral Q6H PRN Karmen Bongo, MD      . camphor-menthol [facility]) lotion 1 application  1 application Topical W2H PRN Karmen Bongo, MD       And  . hydrOXYzine (ATARAX/VISTARIL) tablet 25 mg  25 mg Oral Q8H PRN Karmen Bongo, MD      . docusate sodium [facility]) enema 283 mg  1 enema Rectal PRN Karmen Bongo, MD      . feeding supplement (NEPRO CARB STEADY) liquid 237 mL  237 mL Oral TID PRN Karmen Bongo, MD      . hydrALAZINE (APRESOLINE) injection 5 mg  5 mg  Intravenous Q4H PRN Karmen Bongo, MD      . lactated ringers infusion   Intravenous Continuous Karmen Bongo, MD 100 mL/hr at 11/15/19 0902 New Bag at 11/15/19 0902  . ondansetron (ZOFRAN) tablet 4 mg  4 mg Oral Q6H PRN Karmen Bongo, MD       Or  . ondansetron [facility]) injection 4 mg  4 mg Intravenous Q6H PRN Karmen Bongo, MD      . pantoprazole (PROTONIX) injection 40 mg  40 mg Intravenous Lillia Mountain, MD   40 mg  at 11/15/19 0902  . sorbitol 70 % solution 30 mL  30 mL Oral PRN Karmen Bongo, MD      . tamsulosin [facility]) capsule 0.4 mg  0.4 mg Oral Daily Karmen Bongo, MD   0.4 mg at 11/15/19 0902  . zolpidem (AMBIEN) tablet 5 mg  5 mg Oral QHS PRN Karmen Bongo, MD          Review of Systems: 10 systems reviewed and negative except per interval history/subjective  Physical Exam: Vitals:   11/15/19 0520 11/15/19 0901  BP: 137/69 (!) 146/62  Pulse: 71 75  Resp: 16   Temp: 98.1 F (36.7 C)   SpO2: 97%    Total I/O In: 466.7 [I.V.:466.7] Out: -   Intake/Output Summary (Last 24 hours) at 11/15/2019 0955 Last data filed at 11/15/2019 0740 Gross per 24 hour  Intake 1782.75 ml  Output 1000 ml  Net 782.75 ml   Constitutional: well-appearing, no acute distress ENMT: ears and nose without scars or lesions, MMM CV: normal rate, no edema Respiratory: Bilateral chest rise, normal work of breathing Gastrointestinal: soft, non-tender, no palpable masses or hernias Skin: no visible lesions or rashes Psych: alert, judgement/insight appropriate, appropriate mood and affect   Test Results I personally reviewed new and old clinical labs and radiology tests Lab Results  Component Value Date   NA 143 11/15/2019   K 4.6 11/15/2019   CL 111 11/15/2019   CO2 21 (L) 11/15/2019   BUN 95 (H) 11/15/2019   CREATININE 4.08 (H) 11/15/2019   CALCIUM 8.8 (L) 11/15/2019   ALBUMIN 3.2 (L) 11/14/2019   PHOS 2.8 11/04/2019

## 2019-11-15 NOTE — Progress Notes (Signed)
EGD consent signed and placed in chart

## 2019-11-15 NOTE — Progress Notes (Signed)
Blood transfusion completed at the said time. Patient tolerated it well. Vital signs stable.

## 2019-11-15 NOTE — Progress Notes (Addendum)
Progress Note    Paul Price  OXB:[id number] DOB: 1940/10/14  DOA: 11/14/2019 PCP: [name], MD    Brief Narrative:    Medical records reviewed and are as summarized below:  Paul Price is an 79 y.o. 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  He was last admitted for GI bleeding in 08/2016 with really unremarkable EGD with scant gastritis; colonoscopy with diverticulosis and hemorrhoids without apparent bleeding.  He reports that 2 days ago he was feeling weak and checked his BP and it read 90/40.  He took it easy but didn't have any energy.  He checked his BP again today, 90/45.  He still felt tired and so called his PCP and he was seen in the office today.  His Hgb was low, 5.9.  His kidney function was also worse than usual.  Plan for EGD in AM.    Assessment/Plan:   Principal Problem:   Symptomatic anemia Active Problems:   CAD - CABG '92, LAD DES 4/12, low risk Myoview 6/13   HTN (hypertension)   Hyperlipidemia with target LDL less than 70   OSA on CPAP   PAF (paroxysmal atrial fibrillation) (HCC)   Chronic diastolic (congestive) heart failure (HCC)   Upper GI bleeding   Stage 5 chronic kidney disease (HCC)    Symptomatic anemia -Patient with prior remote history (2018) for GI bleeding, thought to be related to gastritis; he required transfusion during that admission -He also has stage 5 CKD, for which he receives twice weekly Epo infusions as well as periodic iron infusions -He is not presenting with fatigue and generalized weakness; found to have a substantial decrease in Hgb since 10/26 -Appears to be primarily ABLA with superimposed anemia of chronic disease associated with renal disease -Heme testing was positive -Transfused 2 units PRBC  -h/h q 12  Suspected Upper GI Bleeding -GI  consulted -NPO for EGD in AM -LR at 100 mL/hr -Start IV pantoprazole 40 BID for patients with ongoing bleeding -Zofran IV for nausea -Avoid NSAIDs and SQ heparin   Stage 5 CKD -Patient has advanced CKD at baseline -He was previously evaluated for PD and was determined to not be a candidate -He is scheduled to see vascular in early December to consider AV fistula placement in anticipation of future need for HD -Creatinine is currently increased from 3.24 -> 4.58; this may be associated with volume depletion and/or recent increase in Lasix dosing -However, BUN has essentially doubled from 53 -> 102, further indicating probable upper GI blood loss -He is s/p R nephrectomy due to [place] about 7 months ago -Hopefully his renal function will improve with blood products and IVF -appreciated nephrology consult  CAD -He is s/p CABG -He is not on ASA due to [facility] -Currently without c/o CP; if this develops, he will need a higher Hgb goal  Chronic combined CHF -His Lasix was increased from 20 to 40 mg daily recently -Will hold Lasix for now given concern for worsening renal function and possible overdiuresis -No current concern for decompensation -Last echo was in 2018 and showed EF 40-45% with prior h/o grade 2 diastolic dysfunction  HTN -norvasc -Continue Toprol XL  HLD -Continue Lipitor -Hold Lofibra while in hospital  Afib  -He is rate controlled with Toprol XL  -Hold Xarelto  due to GI bleeding- need to consider change to elquis if Cr remains poor  OSA on CPAP -Continue CPAP    Family Communication/Anticipated D/C date and plan/Code Status   DVT prophylaxis: scd Code Status: dnr Disposition Plan: Status is: Inpatient  Remains inpatient appropriate because:Inpatient level of care appropriate due to severity of illness   Dispo: The patient is from: Home              Anticipated d/c is to: Home              Anticipated d/c date is: 2 days              Patient  currently is not medically stable to d/c.         Medical Consultants:    GI  nephrology  Subjective:   hungry  Objective:    Vitals:   11/15/19 0025 11/15/19 0520 11/15/19 0901 11/15/19 1227  BP: (!) 142/61 137/69 (!) 146/62 (!) 127/55  Pulse: 75 71 75 72  Resp: 19 16  16   Temp: 97.9 F (36.6 C) 98.1 F (36.7 C)  98.1 F (36.7 C)  TempSrc: Oral Oral  Oral  SpO2: 98% 97%  100%  Weight:      Height:        Intake/Output Summary (Last 24 hours) at 11/15/2019 1255 Last data filed at 11/15/2019 1217 Gross per 24 hour  Intake 2689.38 ml  Output 1000 ml  Net 1689.38 ml   Filed Weights   11/14/19 1244  Weight: 82.1 kg    Exam:  General: Appearance:     Overweight male in no acute distress     Lungs:     respirations unlabored  Heart:    Normal heart rate.  No murmurs, rubs, or gallops.   MS:   All extremities are intact.   Neurologic:   Awake, alert, oriented x 3. No apparent focal neurological           defect.     Data Reviewed:   I have personally reviewed following labs and imaging studies:  Labs: Labs show the following:   Basic Metabolic Panel: Recent Labs  Lab 11/14/19 1306 11/15/19 0616  NA 140 143  K 5.2* 4.6  CL 107 111  CO2 21* 21*  GLUCOSE 135* 105*  BUN 102* 95*  CREATININE 4.58* 4.08*  CALCIUM 8.9 8.8*   GFR Estimated Creatinine Clearance: 14.3 mL/min (A) (by C-G formula based on SCr of 4.08 mg/dL (H)). Liver Function Tests: Recent Labs  Lab 11/14/19 1306  AST 31  ALT 13  ALKPHOS 88  BILITOT 0.4  PROT 5.9*  ALBUMIN 3.2*   No results for input(s): LIPASE, AMYLASE in the last 168 hours. No results for input(s): AMMONIA in the last 168 hours. Coagulation profile Recent Labs  Lab 11/14/19 1306  INR 2.6*    CBC: Recent Labs  Lab 11/14/19 1306 11/14/19 2041 11/15/19 0616  WBC 9.7 10.3 10.1  NEUTROABS 8.0*  --   --   HGB 6.2* 6.9* 7.8*  HCT 21.2* 22.7* 25.8*  MCV 98.6 91.5 90.8  PLT 174 144* 146*    Cardiac Enzymes: No results for input(s): CKTOTAL, CKMB, CKMBINDEX, TROPONINI in the last 168 hours. BNP (last 3 results) No results for input(s): PROBNP in the last 8760 hours. CBG: No results for input(s): GLUCAP in the last 168 hours. D-Dimer: No results for input(s): DDIMER in the last 72 hours. Hgb A1c: No results for input(s): HGBA1C in  the last 72 hours. Lipid Profile: No results for input(s): CHOL, HDL, LDLCALC, TRIG, CHOLHDL, LDLDIRECT in the last 72 hours. Thyroid function studies: No results for input(s): TSH, T4TOTAL, T3FREE, THYROIDAB in the last 72 hours.  Invalid input(s): FREET3 Anemia work up: Recent Labs    11/14/19 1306  VITAMINB12 424  FOLATE 38.1  FERRITIN 70  TIBC 239*  IRON 39*  RETICCTPCT 5.2*   Sepsis Labs: Recent Labs  Lab 11/14/19 1306 11/14/19 2041 11/15/19 0616  WBC 9.7 10.3 10.1    Microbiology Recent Results (from the past 240 hour(s))  Respiratory Panel by RT PCR (Flu A&B, Covid) - Nasopharyngeal Swab     Status: None   Collection Time: 11/14/19  3:54 PM   Specimen: Nasopharyngeal Swab  Result Value Ref Range Status   SARS Coronavirus 2 by RT PCR NEGATIVE NEGATIVE Final    Comment: (NOTE) SARS-CoV-2 target nucleic acids are NOT DETECTED.  The SARS-CoV-2 RNA is generally detectable in upper respiratoy specimens during the acute phase of infection. The lowest concentration of SARS-CoV-2 viral copies this assay can detect is 131 copies/mL. A negative result does not preclude SARS-Cov-2 infection and should not be used as the sole basis for treatment or other patient management decisions. A negative result may occur with  improper specimen collection/handling, submission of specimen other than nasopharyngeal swab, presence of viral mutation(s) within the areas targeted by this assay, and inadequate number of viral copies (<131 copies/mL). A negative result must be combined with clinical observations, patient history, and  epidemiological information. The expected result is Negative.  Fact Sheet for Patients:  PinkCheek.be  Fact Sheet for Healthcare Providers:  GravelBags.it  This test is no t yet approved or cleared by the [place] FDA and  has been authorized for detection and/or diagnosis of SARS-CoV-2 by FDA under an Emergency Use Authorization (EUA). This EUA will remain  in effect (meaning this test can be used) for the duration of the COVID-19 declaration under Section 564(b)(1) of the Act, 21 U.S.C. section 360bbb-3(b)(1), unless the authorization is terminated or revoked sooner.     Influenza A by PCR NEGATIVE NEGATIVE Final   Influenza B by PCR NEGATIVE NEGATIVE Final    Comment: (NOTE) The Xpert Xpress SARS-CoV-2/FLU/RSV assay is intended as an aid in  the diagnosis of influenza from Nasopharyngeal swab specimens and  should not be used as a sole basis for treatment. Nasal washings and  aspirates are unacceptable for Xpert Xpress SARS-CoV-2/FLU/RSV  testing.  Fact Sheet for Patients: PinkCheek.be  Fact Sheet for Healthcare Providers: GravelBags.it  This test is not yet approved or cleared by the [place] FDA and  has been authorized for detection and/or diagnosis of SARS-CoV-2 by  FDA under an Emergency Use Authorization (EUA). This EUA will remain  in effect (meaning this test can be used) for the duration of the  Covid-19 declaration under Section 564(b)(1) of the Act, 21  U.S.C. section 360bbb-3(b)(1), unless the authorization is  terminated or revoked. Performed at [facility], [place] [address] [place], [place] [address]     Procedures and diagnostic studies:  No results found.  Medications:   . [START ON 11/16/2019] allopurinol  200 mg Oral Daily  . amLODipine  5 mg Oral Daily  . atorvastatin  20 mg Oral Daily  . pantoprazole (PROTONIX) IV   40 mg Intravenous Q12H  . tamsulosin  0.4 mg Oral Daily   Continuous Infusions: . sodium chloride    . sodium  chloride    . lactated ringers 100 mL/hr at 11/15/19 0902     LOS: 1 day   Geradine Girt  [facility]   How to contact the [facility] Attending or Consulting provider [place] or covering provider during after hours [place], for this patient?  1. Check the care team in [facility] and look for a) attending/consulting [facility] provider listed and b) the [facility] team listed 2. Log into [email or web address] and use [place]'s universal password to access. If you do not have the password, please contact the hospital operator. 3. Locate the [facility] provider you are looking for under [facility] and page to a number that you can be directly reached. 4. If you still have difficulty reaching the provider, please page the [facility] (Director on Call) for the Hospitalists listed on amion for assistance.  11/15/2019, 12:55 PM

## 2019-11-15 NOTE — Consult Note (Signed)
Referring Provider: Dr. Eliseo Squires Primary Care Physician:  [name], MD Primary Gastroenterologist:  Dr. Paulita Fujita  Reason for Consultation:  Melena  HPI: Paul Price is a 79 y.o. male with multiple medical problems on Xarelto for Afib seen for a consult due to black tarry stools for over a week without associated abdominal pain, nausea, or vomiting. Denies hematochezia. Felt weak and dizzy yesterday. Reports having one black stool since admit but not documented. Hgb 5.9 at his doctor's office yesterday and 6.2 in ER. BP 90's/40's. Transfused 2 U PRBCs to 7.8. Melena in 2018 and EGD showed gastritis. Colonoscopy showed internal and external hemorrhoids and sigmoid diverticulosis. Wife at bedside.  Past Medical History:  Diagnosis Date  . Arthritis    "shoulders" (09/07/2016)  . Atrial fibrillation ([place])   . BPH (benign prostatic hyperplasia)   . Chronic kidney disease (CKD), stage III (moderate) (HCC)   . Coronary artery disease   . DDD (degenerative disc disease), cervical   . DVT (deep venous thrombosis) ([place]) 12/2013   LLE  . GERD (gastroesophageal reflux disease)   . Gout   . High cholesterol   . History of blood transfusion 09/06/2016   2 u PRBC  . History of scarlet fever 1940s  . History of stress test 06/2011   No significant ischemia, this is a low risk scan. Clinical correlation recommended Abnormal myocardial perfusion study.  Marland Kitchen Hx of echocardiogram 05/2009   EF 40-45%, he did have mild annular calcification with mild-to-moderate MR and mild TR as well as aortic valve sclerosis. Estimated RV systolic pressure was 21 mm.  . Hypertension   . Iron deficiency anemia   . Kidney failure   . 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 Neck pain    "not chronic" (09/07/2016)  . Obesity   . OSA on CPAP   . RBBB   . Renal insufficiency   . Right renal mass   . Spinal stenosis of lumbar region     Past Surgical History:  Procedure Laterality Date  . APPENDECTOMY    .  CARDIAC CATHETERIZATION  2009   he was found to have a atretic LIMA graft to his LAD and had a patent vein graft supplying his diagonal vessel. At that time, he had 70% narrowing in his LAD beyond a diagonal vessel which was initially treated medically.  . COLONOSCOPY WITH PROPOFOL Left 09/09/2016   Procedure: COLONOSCOPY WITH PROPOFOL;  Surgeon: Arta Silence, MD;  Location: [place];  Service: Endoscopy;  Laterality: Left;  . CORONARY ANGIOPLASTY WITH STENT PLACEMENT  April 2012   LAD DES  . CORONARY ARTERY BYPASS GRAFT  1992   with LIMA to the LAD and diagonal.  . CYSTOSCOPY N/A 03/31/2019   Procedure: CYSTOSCOPY FLEXIBLE;  Surgeon: Lucas Mallow, MD;  Location: [facility];  Service: Urology;  Laterality: N/A;  . CYSTOSCOPY WITH URETHRAL DILATATION N/A 05/21/2019   Procedure: CYSTOSCOPY WITH URETHRAL BALLOON DILATATION;  Surgeon: Lucas Mallow, MD;  Location: [facility];  Service: Urology;  Laterality: N/A;  . ESOPHAGOGASTRODUODENOSCOPY (EGD) WITH PROPOFOL Left 09/08/2016   Procedure: ESOPHAGOGASTRODUODENOSCOPY (EGD) WITH PROPOFOL;  Surgeon: Ronnette Juniper, MD;  Location: [place];  Service: Gastroenterology;  Laterality: Left;  . INGUINAL HERNIA REPAIR     "don't remeimber which side"  . LAPAROSCOPIC NEPHRECTOMY Right 03/31/2019   Procedure: RIGHT  HAND ASSIST LAPAROSCOPIC NEPHRECTOMY;  Surgeon: Lucas Mallow, MD;  Location: [facility];  Service: Urology;  Laterality: Right;  .  PACEMAKER IMPLANT N/A 12/25/2016   Procedure: PACEMAKER IMPLANT;  Surgeon: Deboraha Sprang, MD;  Location: [facility];  Service: Cardiovascular;  Laterality: N/A;  . TONSILLECTOMY      Prior to Admission medications   Medication Sig Start Date End Date Taking? Authorizing Provider  acetaminophen (TYLENOL) 500 MG tablet Take 500-1,000 mg by mouth every 6 (six) hours as needed for mild pain, moderate pain or headache.    Yes [provider]  allopurinol (ZYLOPRIM) 300 MG tablet Take 300 mg by  mouth daily.   Yes [provider]  amLODipine (NORVASC) 5 MG tablet Take 2.5 mg by mouth daily.    Yes [provider]  atorvastatin (LIPITOR) 20 MG tablet TAKE 1 TABLET BY MOUTH DAILY Patient taking differently: Take 20 mg by mouth daily.  09/08/19  Yes Troy Sine, MD  b complex vitamins tablet Take 1 tablet by mouth daily.   Yes [provider]  fenofibrate micronized (LOFIBRA) 134 MG capsule Take 134 mg by mouth daily before breakfast.   Yes [provider]  furosemide (LASIX) 40 MG tablet Take 40 mg by mouth daily. 10/23/19  Yes [provider]  metoprolol succinate (TOPROL-XL) 25 MG 24 hr tablet TAKE 1 TABLET BY MOUTH DAILY Patient taking differently: Take 25 mg by mouth daily.  12/25/18  Yes Troy Sine, MD  PRILOSEC OTC 20 MG tablet Take 20 mg by mouth daily before breakfast.    Yes [provider]  Rivaroxaban (XARELTO) 15 MG TABS tablet Take 1 tablet (15 mg total) by mouth daily with supper. Patient taking differently: Take 15 mg by mouth daily after supper.  09/10/16  Yes Velvet Bathe, MD  Tamsulosin HCl (FLOMAX) 0.4 MG CAPS Take 1 capsule (0.4 mg total) by mouth daily. 07/20/11  Yes Hunt, Meagan, MD    Scheduled Meds: . allopurinol  300 mg Oral Daily  . amLODipine  5 mg Oral Daily  . atorvastatin  20 mg Oral Daily  . pantoprazole (PROTONIX) IV  40 mg Intravenous Q12H  . tamsulosin  0.4 mg Oral Daily   Continuous Infusions: . sodium chloride    . lactated ringers 100 mL/hr at 11/15/19 0902   PRN Meds:.acetaminophen **OR** acetaminophen, calcium carbonate (dosed in mg elemental calcium), camphor-menthol **AND** hydrOXYzine, docusate sodium, feeding supplement (NEPRO CARB STEADY), hydrALAZINE, ondansetron **OR** ondansetron (ZOFRAN) IV, sorbitol, zolpidem  Allergies as of 11/14/2019 - Review Complete 11/14/2019  Allergen Reaction Noted  . Nitrostat [nitroglycerin] Other (See Comments) 08/20/2012    Family History   Problem Relation Age of Onset  . Heart disease Father     Social History   Socioeconomic History  . Marital status: Married    Spouse name: Not on file  . Number of children: Not on file  . Years of education: Not on file  . Highest education level: Not on file  Occupational History  . Not on file  Tobacco Use  . Smoking status: Former Smoker    Packs/day: 1.00    Years: 30.00    Pack years: 30.00    Types: Cigarettes  . Smokeless tobacco: Never Used  . Tobacco comment: quit ~ 1985  Vaping Use  . Vaping Use: Never used  Substance and Sexual Activity  . Alcohol use: Yes    Alcohol/week: 0.0 standard drinks    Comment: rare beer  . Drug use: No  . Sexual activity: Not on file  Other Topics Concern  . Not on file  Social History Narrative  . Not on file   Social Determinants of Health   Financial Resource Strain:   . Difficulty of Paying Living Expenses: Not on file  Food Insecurity:   . Worried About Charity fundraiser in the Last Year: Not on file  . Ran Out of Food in the Last Year: Not on file  Transportation Needs:   . Lack of Transportation (Medical): Not on file  . Lack of Transportation (Non-Medical): Not on file  Physical Activity:   . Days of Exercise per Week: Not on file  . Minutes of Exercise per Session: Not on file  Stress:   . Feeling of Stress : Not on file  Social Connections:   . Frequency of Communication with Friends and Family: Not on file  . Frequency of Social Gatherings with Friends and Family: Not on file  . Attends Religious Services: Not on file  . Active Member of Clubs or Organizations: Not on file  . Attends Archivist Meetings: Not on file  . Marital Status: Not on file  Intimate Partner Violence:   . Fear of Current or Ex-Partner: Not on file  . Emotionally Abused: Not on file  . Physically Abused: Not on file  . Sexually Abused: Not on file    Review of Systems: All negative except as stated above in  HPI.  Physical Exam: Vital signs: Vitals:   11/15/19 0520 11/15/19 0901  BP: 137/69 (!) 146/62  Pulse: 71 75  Resp: 16   Temp: 98.1 F (36.7 C)   SpO2: 97%    Last BM Date: 11/15/19 General:   Lethargic, Well-developed, well-nourished, pleasant and cooperative in NAD, elderly Head: normocephalic, atraumatic Eyes: anicteric sclera ENT: oropharynx clear Neck: supple, nontender Lungs:  Clear throughout to auscultation.   No wheezes, crackles, or rhonchi. No acute distress. Heart:  Regular rate and rhythm; no murmurs, clicks, rubs,  or gallops. Abdomen: soft, nontender, nondistended, +BS  Rectal:  Deferred Ext: no edema  GI:  Lab Results: Recent Labs    11/14/19 1306 11/14/19 2041 11/15/19 0616  WBC 9.7 10.3 10.1  HGB 6.2* 6.9* 7.8*  HCT 21.2* 22.7* 25.8*  PLT 174 144* 146*   BMET Recent Labs    11/14/19 1306 11/15/19 0616  NA 140 143  K 5.2* 4.6  CL 107 111  CO2 21* 21*  GLUCOSE 135* 105*  BUN 102* 95*  CREATININE 4.58* 4.08*  CALCIUM 8.9 8.8*   LFT Recent Labs    11/14/19 1306  PROT 5.9*  ALBUMIN 3.2*  AST 31  ALT 13  ALKPHOS 88  BILITOT 0.4   PT/INR Recent Labs    11/14/19 1306  LABPROT 26.7*  INR 2.6*     Studies/Results: No results found.  Impression/Plan: Melenic stools on Xarelto with Hgb 6.2. Hemodynamically stable. In need of an EGD to further evaluate and will give another day off of Xarelto and plan to do the EGD tomorrow morning. Continue to hold Xarelto. Clear liquid diet today. NPO p MN. Continue PPI IV Q 12 hours. Supportive care.    LOS: 1 day   Lear Ng  11/15/2019, 10:48 AM  Questions please call [phone]

## 2019-11-15 NOTE — H&P (View-Only) (Signed)
Referring Provider: Dr. Eliseo Squires Primary Care Physician:  [name], MD Primary Gastroenterologist:  Dr. Paulita Fujita  Reason for Consultation:  Melena  HPI: Paul Price is a 79 y.o. male with multiple medical problems on Xarelto for Afib seen for a consult due to black tarry stools for over a week without associated abdominal pain, nausea, or vomiting. Denies hematochezia. Felt weak and dizzy yesterday. Reports having one black stool since admit but not documented. Hgb 5.9 at his doctor's office yesterday and 6.2 in ER. BP 90's/40's. Transfused 2 U PRBCs to 7.8. Melena in 2018 and EGD showed gastritis. Colonoscopy showed internal and external hemorrhoids and sigmoid diverticulosis. Wife at bedside.  Past Medical History:  Diagnosis Date  . Arthritis    "shoulders" (09/07/2016)  . Atrial fibrillation ([place])   . BPH (benign prostatic hyperplasia)   . Chronic kidney disease (CKD), stage III (moderate) (HCC)   . Coronary artery disease   . DDD (degenerative disc disease), cervical   . DVT (deep venous thrombosis) ([place]) 12/2013   LLE  . GERD (gastroesophageal reflux disease)   . Gout   . High cholesterol   . History of blood transfusion 09/06/2016   2 u PRBC  . History of scarlet fever 1940s  . History of stress test 06/2011   No significant ischemia, this is a low risk scan. Clinical correlation recommended Abnormal myocardial perfusion study.  Marland Kitchen Hx of echocardiogram 05/2009   EF 40-45%, he did have mild annular calcification with mild-to-moderate MR and mild TR as well as aortic valve sclerosis. Estimated RV systolic pressure was 21 mm.  . Hypertension   . Iron deficiency anemia   . Kidney failure   . 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 Neck pain    "not chronic" (09/07/2016)  . Obesity   . OSA on CPAP   . RBBB   . Renal insufficiency   . Right renal mass   . Spinal stenosis of lumbar region     Past Surgical History:  Procedure Laterality Date  . APPENDECTOMY    .  CARDIAC CATHETERIZATION  2009   he was found to have a atretic LIMA graft to his LAD and had a patent vein graft supplying his diagonal vessel. At that time, he had 70% narrowing in his LAD beyond a diagonal vessel which was initially treated medically.  . COLONOSCOPY WITH PROPOFOL Left 09/09/2016   Procedure: COLONOSCOPY WITH PROPOFOL;  Surgeon: Arta Silence, MD;  Location: [facility];  Service: Endoscopy;  Laterality: Left;  . CORONARY ANGIOPLASTY WITH STENT PLACEMENT  April 2012   LAD DES  . CORONARY ARTERY BYPASS GRAFT  1992   with LIMA to the LAD and diagonal.  . CYSTOSCOPY N/A 03/31/2019   Procedure: CYSTOSCOPY FLEXIBLE;  Surgeon: Lucas Mallow, MD;  Location: [facility];  Service: Urology;  Laterality: N/A;  . CYSTOSCOPY WITH URETHRAL DILATATION N/A 05/21/2019   Procedure: CYSTOSCOPY WITH URETHRAL BALLOON DILATATION;  Surgeon: Lucas Mallow, MD;  Location: [facility];  Service: Urology;  Laterality: N/A;  . ESOPHAGOGASTRODUODENOSCOPY (EGD) WITH PROPOFOL Left 09/08/2016   Procedure: ESOPHAGOGASTRODUODENOSCOPY (EGD) WITH PROPOFOL;  Surgeon: Ronnette Juniper, MD;  Location: [facility];  Service: Gastroenterology;  Laterality: Left;  . INGUINAL HERNIA REPAIR     "don't remeimber which side"  . LAPAROSCOPIC NEPHRECTOMY Right 03/31/2019   Procedure: RIGHT  HAND ASSIST LAPAROSCOPIC NEPHRECTOMY;  Surgeon: Lucas Mallow, MD;  Location: [facility];  Service: Urology;  Laterality: Right;  .  PACEMAKER IMPLANT N/A 12/25/2016   Procedure: PACEMAKER IMPLANT;  Surgeon: Deboraha Sprang, MD;  Location: [facility];  Service: Cardiovascular;  Laterality: N/A;  . TONSILLECTOMY      Prior to Admission medications   Medication Sig Start Date End Date Taking? Authorizing Provider  acetaminophen (TYLENOL) 500 MG tablet Take 500-1,000 mg by mouth every 6 (six) hours as needed for mild pain, moderate pain or headache.    Yes [provider]  allopurinol (ZYLOPRIM) 300 MG tablet Take 300 mg by  mouth daily.   Yes [provider]  amLODipine (NORVASC) 5 MG tablet Take 2.5 mg by mouth daily.    Yes [provider]  atorvastatin (LIPITOR) 20 MG tablet TAKE 1 TABLET BY MOUTH DAILY Patient taking differently: Take 20 mg by mouth daily.  09/08/19  Yes Troy Sine, MD  b complex vitamins tablet Take 1 tablet by mouth daily.   Yes [provider]  fenofibrate micronized (LOFIBRA) 134 MG capsule Take 134 mg by mouth daily before breakfast.   Yes [provider]  furosemide (LASIX) 40 MG tablet Take 40 mg by mouth daily. 10/23/19  Yes [provider]  metoprolol succinate (TOPROL-XL) 25 MG 24 hr tablet TAKE 1 TABLET BY MOUTH DAILY Patient taking differently: Take 25 mg by mouth daily.  12/25/18  Yes Troy Sine, MD  PRILOSEC OTC 20 MG tablet Take 20 mg by mouth daily before breakfast.    Yes [provider]  Rivaroxaban (XARELTO) 15 MG TABS tablet Take 1 tablet (15 mg total) by mouth daily with supper. Patient taking differently: Take 15 mg by mouth daily after supper.  09/10/16  Yes Velvet Bathe, MD  Tamsulosin HCl (FLOMAX) 0.4 MG CAPS Take 1 capsule (0.4 mg total) by mouth daily. 07/20/11  Yes Hunt, Meagan, MD    Scheduled Meds: . allopurinol  300 mg Oral Daily  . amLODipine  5 mg Oral Daily  . atorvastatin  20 mg Oral Daily  . pantoprazole (PROTONIX) IV  40 mg Intravenous Q12H  . tamsulosin  0.4 mg Oral Daily   Continuous Infusions: . sodium chloride    . lactated ringers 100 mL/hr at 11/15/19 0902   PRN Meds:.acetaminophen **OR** acetaminophen, calcium carbonate (dosed in mg elemental calcium), camphor-menthol **AND** hydrOXYzine, docusate sodium, feeding supplement (NEPRO CARB STEADY), hydrALAZINE, ondansetron **OR** ondansetron (ZOFRAN) IV, sorbitol, zolpidem  Allergies as of 11/14/2019 - Review Complete 11/14/2019  Allergen Reaction Noted  . Nitrostat [nitroglycerin] Other (See Comments) 08/20/2012    Family History   Problem Relation Age of Onset  . Heart disease Father     Social History   Socioeconomic History  . Marital status: Married    Spouse name: Not on file  . Number of children: Not on file  . Years of education: Not on file  . Highest education level: Not on file  Occupational History  . Not on file  Tobacco Use  . Smoking status: Former Smoker    Packs/day: 1.00    Years: 30.00    Pack years: 30.00    Types: Cigarettes  . Smokeless tobacco: Never Used  . Tobacco comment: quit ~ 1985  Vaping Use  . Vaping Use: Never used  Substance and Sexual Activity  . Alcohol use: Yes    Alcohol/week: 0.0 standard drinks    Comment: rare beer  . Drug use: No  . Sexual activity: Not on file  Other Topics Concern  . Not on file  Social History Narrative  . Not on file   Social Determinants of Health   Financial Resource Strain:   . Difficulty of Paying Living Expenses: Not on file  Food Insecurity:   . Worried About Charity fundraiser in the Last Year: Not on file  . Ran Out of Food in the Last Year: Not on file  Transportation Needs:   . Lack of Transportation (Medical): Not on file  . Lack of Transportation (Non-Medical): Not on file  Physical Activity:   . Days of Exercise per Week: Not on file  . Minutes of Exercise per Session: Not on file  Stress:   . Feeling of Stress : Not on file  Social Connections:   . Frequency of Communication with Friends and Family: Not on file  . Frequency of Social Gatherings with Friends and Family: Not on file  . Attends Religious Services: Not on file  . Active Member of Clubs or Organizations: Not on file  . Attends Archivist Meetings: Not on file  . Marital Status: Not on file  Intimate Partner Violence:   . Fear of Current or Ex-Partner: Not on file  . Emotionally Abused: Not on file  . Physically Abused: Not on file  . Sexually Abused: Not on file    Review of Systems: All negative except as stated above in  HPI.  Physical Exam: Vital signs: Vitals:   11/15/19 0520 11/15/19 0901  BP: 137/69 (!) 146/62  Pulse: 71 75  Resp: 16   Temp: 98.1 F (36.7 C)   SpO2: 97%    Last BM Date: 11/15/19 General:   Lethargic, Well-developed, well-nourished, pleasant and cooperative in NAD, elderly Head: normocephalic, atraumatic Eyes: anicteric sclera ENT: oropharynx clear Neck: supple, nontender Lungs:  Clear throughout to auscultation.   No wheezes, crackles, or rhonchi. No acute distress. Heart:  Regular rate and rhythm; no murmurs, clicks, rubs,  or gallops. Abdomen: soft, nontender, nondistended, +BS  Rectal:  Deferred Ext: no edema  GI:  Lab Results: Recent Labs    11/14/19 1306 11/14/19 2041 11/15/19 0616  WBC 9.7 10.3 10.1  HGB 6.2* 6.9* 7.8*  HCT 21.2* 22.7* 25.8*  PLT 174 144* 146*   BMET Recent Labs    11/14/19 1306 11/15/19 0616  NA 140 143  K 5.2* 4.6  CL 107 111  CO2 21* 21*  GLUCOSE 135* 105*  BUN 102* 95*  CREATININE 4.58* 4.08*  CALCIUM 8.9 8.8*   LFT Recent Labs    11/14/19 1306  PROT 5.9*  ALBUMIN 3.2*  AST 31  ALT 13  ALKPHOS 88  BILITOT 0.4   PT/INR Recent Labs    11/14/19 1306  LABPROT 26.7*  INR 2.6*     Studies/Results: No results found.  Impression/Plan: Melenic stools on Xarelto with Hgb 6.2. Hemodynamically stable. In need of an EGD to further evaluate and will give another day off of Xarelto and plan to do the EGD tomorrow morning. Continue to hold Xarelto. Clear liquid diet today. NPO p MN. Continue PPI IV Q 12 hours. Supportive care.    LOS: 1 day   Lear Ng  11/15/2019, 10:48 AM  Questions please call [phone]

## 2019-11-16 ENCOUNTER — Encounter (HOSPITAL_COMMUNITY)
Admission: EM | Disposition: A | Discharge status: Home / Self Care | Payer: Self-pay | Source: Ambulatory Visit | Attending: Internal Medicine

## 2019-11-16 ENCOUNTER — Inpatient Hospital Stay (HOSPITAL_COMMUNITY): Payer: Medicare Other | Admitting: Certified Registered Nurse Anesthetist

## 2019-11-16 ENCOUNTER — Encounter (HOSPITAL_COMMUNITY): Payer: Self-pay | Admitting: Internal Medicine

## 2019-11-16 DIAGNOSIS — K921 Melena: Secondary | ICD-10-CM | POA: Diagnosis present

## 2019-11-16 HISTORY — PX: ESOPHAGOGASTRODUODENOSCOPY: SHX5428

## 2019-11-16 HISTORY — PX: GIVENS CAPSULE STUDY: SHX5432

## 2019-11-16 LAB — CBC
HCT: 24.6 % — ABNORMAL LOW (ref 39.0–52.0)
Hemoglobin: 7.6 g/dL — ABNORMAL LOW (ref 13.0–17.0)
MCH: 28.8 pg (ref 26.0–34.0)
MCHC: 30.9 g/dL (ref 30.0–36.0)
MCV: 93.2 fL (ref 80.0–100.0)
Platelets: 140 10*3/uL — ABNORMAL LOW (ref 150–400)
RBC: 2.64 MIL/uL — ABNORMAL LOW (ref 4.22–5.81)
RDW: 22.9 % — ABNORMAL HIGH (ref 11.5–15.5)
WBC: 8.8 10*3/uL (ref 4.0–10.5)
nRBC: 0 % (ref 0.0–0.2)

## 2019-11-16 LAB — RENAL FUNCTION PANEL
Albumin: 2.9 g/dL — ABNORMAL LOW (ref 3.5–5.0)
Anion gap: 10 (ref 5–15)
BUN: 71 mg/dL — ABNORMAL HIGH (ref 8–23)
CO2: 21 mmol/L — ABNORMAL LOW (ref 22–32)
Calcium: 9 mg/dL (ref 8.9–10.3)
Chloride: 112 mmol/L — ABNORMAL HIGH (ref 98–111)
Creatinine, Ser: 3.51 mg/dL — ABNORMAL HIGH (ref 0.61–1.24)
GFR, Estimated: 17 mL/min — ABNORMAL LOW (ref >60)
Glucose, Bld: 124 mg/dL — ABNORMAL HIGH (ref 70–99)
Phosphorus: 3.8 mg/dL (ref 2.5–4.6)
Potassium: 4.3 mmol/L (ref 3.5–5.1)
Sodium: 143 mmol/L (ref 135–145)

## 2019-11-16 SURGERY — EGD (ESOPHAGOGASTRODUODENOSCOPY)
Anesthesia: Monitor Anesthesia Care

## 2019-11-16 MED ORDER — PROPOFOL 10 MG/ML IV BOLUS
INTRAVENOUS | Status: DC | PRN
  Administered 2019-11-16: 10 mg via INTRAVENOUS

## 2019-11-16 MED ORDER — PROPOFOL 500 MG/50ML IV EMUL
INTRAVENOUS | Status: DC | PRN
  Administered 2019-11-16: 120 ug/kg/min via INTRAVENOUS

## 2019-11-16 NOTE — Op Note (Signed)
[facility] [name] Procedure Date : 11/16/2019 MRN: [id number] Attending MD: Lear Ng , MD Date of Birth: 07/30/1940 CSN: [id number] Age: 79 Admit Type: Inpatient Procedure:                Upper GI endoscopy Indications:              Suspected upper gastrointestinal bleeding, Melena Providers:                Lear Ng, MD, Glori Bickers, RN, Tyrone Apple, Technician, Rejeana Brock, CRNA Referring MD:             hospital team Medicines:                Propofol per Anesthesia, Monitored Anesthesia Care Complications:            No immediate complications. Estimated Blood Loss:     Estimated blood loss: none. Procedure:                Pre-Anesthesia Assessment:                           - Prior to the procedure, a History and Physical                            was performed, and patient medications and                            allergies were reviewed. The patient's tolerance of                            previous anesthesia was also reviewed. The risks                            and benefits of the procedure and the sedation                            options and risks were discussed with the patient.                            All questions were answered, and informed consent                            was obtained. Prior Anticoagulants: The patient has                            taken Xarelto (rivaroxaban), last dose was stopped                            at admission. ASA Grade Assessment: III - A patient                            with severe systemic disease. After reviewing the  risks and benefits, the patient was deemed in                            satisfactory condition to undergo the procedure.                           After obtaining informed consent, the endoscope was                            passed under direct vision. Throughout the                             procedure, the patient's blood pressure, pulse, and                            oxygen saturations were monitored continuously. The                            GIF-H190 ([id number]) Olympus gastroscope was                            introduced through the mouth, and advanced to the                            second part of duodenum. The upper GI endoscopy was                            accomplished without difficulty. The patient                            tolerated the procedure well. Scope In: Scope Out: Findings:      The examined esophagus was normal.      The Z-line was regular and was found 42 cm from the incisors.      The entire examined stomach was normal.      The cardia and gastric fundus were normal on retroflexion.      There is no endoscopic evidence of bleeding in the entire examined       stomach.      Patchy mild mucosal changes characterized by erythema and petechiae were       found in the duodenal bulb.      The exam of the duodenum was otherwise normal. Impression:               - Normal esophagus.                           - Z-line regular, 42 cm from the incisors.                           - Normal stomach.                           - Mucosal changes in the duodenum.                           -  No specimens collected.                           - Question mucosal bleeding in setting of                            anticoagulation. Capsule endoscopy recommended. Recommendation:           - NPO.                           - Observe patient's clinical course.                           - Continue present medications. Procedure Code(s):        --- Professional ---                           [phone], Esophagogastroduodenoscopy, flexible,                            transoral; diagnostic, including collection of                            specimen(s) by brushing or washing, when performed                            (separate procedure) Diagnosis Code(s):        --- Professional  ---                           K92.1, Melena (includes Hematochezia)                           K31.89, Other diseases of stomach and duodenum CPT copyright 2019 [employer]. All rights reserved. The codes documented in this report are preliminary and upon coder review may  be revised to meet current compliance requirements. Lear Ng, MD 11/16/2019 11:24:22 AM This report has been signed electronically. Number of Addenda: 0

## 2019-11-16 NOTE — Progress Notes (Signed)
Progress Note    [name]  TIR:[id number] DOB: 04/19/1940  DOA: 11/14/2019 PCP: [name], MD    Brief Narrative:    Medical records reviewed and are as summarized below:  Paul Price is an 79 y.o. 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  He was last admitted for GI bleeding in 08/2016 with really unremarkable EGD with scant gastritis; colonoscopy with diverticulosis and hemorrhoids without apparent bleeding.  He reports that 2 days ago he was feeling weak and checked his BP and it read 90/40.  He took it easy but didn't have any energy.  He checked his BP again today, 90/45.  He still felt tired and so called his PCP and he was seen in the office today.  His Hgb was low, 5.9.  His kidney function was also worse than usual. S/p EGD, and now getting capsule endoscopy.    Assessment/Plan:   Principal Problem:   Symptomatic anemia Active Problems:   CAD - CABG '92, LAD DES 4/12, low risk Myoview 6/13   HTN (hypertension)   Hyperlipidemia with target LDL less than 70   OSA on CPAP   PAF (paroxysmal atrial fibrillation) (HCC)   Chronic diastolic (congestive) heart failure (HCC)   Upper GI bleeding   Stage 5 chronic kidney disease (HCC)   Melena    Symptomatic anemia -Patient with prior remote history (2018) for GI bleeding, thought to be related to gastritis; he required transfusion during that admission -He also has stage 5 CKD, for which he receives twice weekly Epo infusions as well as periodic iron infusions -He is not presenting with fatigue and generalized weakness; found to have a substantial decrease in Hgb since 10/26 -Appears to be primarily ABLA with superimposed anemia of chronic disease associated with renal disease -Heme testing was positive -Transfused 2 units PRBC  -transfuse  for <7  Suspected Upper GI Bleeding -GI consulted -s/p EGD -LR at 100 mL/hr- end time placed -Start IV pantoprazole 40 BID for patients with ongoing bleeding -Zofran IV for nausea -Avoid NSAIDs and SQ heparin   Stage 5 CKD -Patient has advanced CKD at baseline -He was previously evaluated for PD and was determined to not be a candidate -He is scheduled to see vascular in early December to consider AV fistula placement in anticipation of future need for HD -Creatinine is currently increased from 3.24 -> 4.58; this may be associated with volume depletion and/or recent increase in Lasix dosing -However, BUN has essentially doubled from 53 -> 102, further indicating probable upper GI blood loss -He is s/p R nephrectomy due to [place] about 7 months ago -appreciated nephrology consult- CR improved and they have signed off  CAD -He is s/p CABG -He is not on ASA due to [facility] -Currently without c/o CP; if this develops, he will need a higher Hgb goal  Chronic combined CHF -His Lasix was increased from 20 to 40 mg daily recently -Will hold Lasix for now  -No current concern for decompensation -Last echo was in 2018 and showed EF 40-45% with prior h/o grade 2 diastolic dysfunction  HTN -norvasc  -Continue Toprol XL  HLD -Continue Lipitor -Hold Lofibra while in hospital  Afib  -He is rate controlled with Toprol XL  -Hold Xarelto due to GI bleeding- need to consider  change to elquis if Cr remains poor  OSA on CPAP -Continue CPAP    Family Communication/Anticipated D/C date and plan/Code Status   DVT prophylaxis: scd Code Status: dnr Disposition Plan: Status is: Inpatient Wife at bedside Remains inpatient appropriate because:Inpatient level of care appropriate due to severity of illness   Dispo: The patient is from: Home              Anticipated d/c is to: Home              Anticipated d/c date is: 1-2 days              Patient currently is not medically stable to  d/c.         Medical Consultants:    GI  nephrology  Subjective:   Getting capsule endoscopy No current complaints  Objective:    Vitals:   11/16/19 1115 11/16/19 1130 11/16/19 1145 11/16/19 1213  BP: (!) 127/56 136/62 138/60 (!) 143/62  Pulse: 89 86 85 88  Resp: 19 20 19 20   Temp: (!) 97 F (36.1 C)  (!) 97.3 F (36.3 C) 98.2 F (36.8 C)  TempSrc:    Oral  SpO2: 99% 98% 99% 100%  Weight:      Height:        Intake/Output Summary (Last 24 hours) at 11/16/2019 1514 Last data filed at 11/16/2019 1500 Gross per 24 hour  Intake 2443.06 ml  Output 1550 ml  Net 893.06 ml   Filed Weights   11/14/19 1244  Weight: 82.1 kg    Exam:   General: Appearance:     Overweight male in no acute distress     Lungs:      respirations unlabored  Heart:    Normal heart rate.  No murmurs, rubs, or gallops.   MS:   All extremities are intact.   Neurologic:   Awake, alert, oriented x 3. No apparent focal neurological           defect.                        Data Reviewed:   I have personally reviewed following labs and imaging studies:  Labs: Labs show the following:   Basic Metabolic Panel: Recent Labs  Lab 11/14/19 1306 11/14/19 1306 11/15/19 0616 11/16/19 0901  NA 140  --  143 143  K 5.2*   < > 4.6 4.3  CL 107  --  111 112*  CO2 21*  --  21* 21*  GLUCOSE 135*  --  105* 124*  BUN 102*  --  95* 71*  CREATININE 4.58*  --  4.08* 3.51*  CALCIUM 8.9  --  8.8* 9.0  PHOS  --   --   --  3.8   < > = values in this interval not displayed.   GFR Estimated Creatinine Clearance: 16.6 mL/min (A) (by C-G formula based on SCr of 3.51 mg/dL (H)). Liver Function Tests: Recent Labs  Lab 11/14/19 1306 11/16/19 0901  AST 31  --   ALT 13  --   ALKPHOS 88  --   BILITOT 0.4  --   PROT 5.9*  --   ALBUMIN 3.2* 2.9*   No results for input(s): LIPASE, AMYLASE in the last 168 hours. No results for input(s): AMMONIA in the last 168 hours. Coagulation  profile Recent Labs  Lab 11/14/19 1306  INR 2.6*    CBC: Recent Labs  Lab 11/14/19  1306 11/14/19 2041 11/15/19 0616 11/15/19 1641 11/16/19 0414  WBC 9.7 10.3 10.1 9.2 8.8  NEUTROABS 8.0*  --   --   --   --   HGB 6.2* 6.9* 7.8* 8.0* 7.6*  HCT 21.2* 22.7* 25.8* 25.5* 24.6*  MCV 98.6 91.5 90.8 91.7 93.2  PLT 174 144* 146* 141* 140*   Cardiac Enzymes: No results for input(s): CKTOTAL, CKMB, CKMBINDEX, TROPONINI in the last 168 hours. BNP (last 3 results) No results for input(s): PROBNP in the last 8760 hours. CBG: No results for input(s): GLUCAP in the last 168 hours. D-Dimer: No results for input(s): DDIMER in the last 72 hours. Hgb A1c: No results for input(s): HGBA1C in the last 72 hours. Lipid Profile: No results for input(s): CHOL, HDL, LDLCALC, TRIG, CHOLHDL, LDLDIRECT in the last 72 hours. Thyroid function studies: No results for input(s): TSH, T4TOTAL, T3FREE, THYROIDAB in the last 72 hours.  Invalid input(s): FREET3 Anemia work up: Recent Labs    11/14/19 1306  VITAMINB12 424  FOLATE 38.1  FERRITIN 70  TIBC 239*  IRON 39*  RETICCTPCT 5.2*   Sepsis Labs: Recent Labs  Lab 11/14/19 2041 11/15/19 0616 11/15/19 1641 11/16/19 0414  WBC 10.3 10.1 9.2 8.8    Microbiology Recent Results (from the past 240 hour(s))  Respiratory Panel by RT PCR (Flu A&B, Covid) - Nasopharyngeal Swab     Status: None   Collection Time: 11/14/19  3:54 PM   Specimen: Nasopharyngeal Swab  Result Value Ref Range Status   SARS Coronavirus 2 by RT PCR NEGATIVE NEGATIVE Final    Comment: (NOTE) SARS-CoV-2 target nucleic acids are NOT DETECTED.  The SARS-CoV-2 RNA is generally detectable in upper respiratoy specimens during the acute phase of infection. The lowest concentration of SARS-CoV-2 viral copies this assay can detect is 131 copies/mL. A negative result does not preclude SARS-Cov-2 infection and should not be used as the sole basis for treatment or other patient  management decisions. A negative result may occur with  improper specimen collection/handling, submission of specimen other than nasopharyngeal swab, presence of viral mutation(s) within the areas targeted by this assay, and inadequate number of viral copies (<131 copies/mL). A negative result must be combined with clinical observations, patient history, and epidemiological information. The expected result is Negative.  Fact Sheet for Patients:  PinkCheek.be  Fact Sheet for Healthcare Providers:  GravelBags.it  This test is no t yet approved or cleared by the [place] FDA and  has been authorized for detection and/or diagnosis of SARS-CoV-2 by FDA under an Emergency Use Authorization (EUA). This EUA will remain  in effect (meaning this test can be used) for the duration of the COVID-19 declaration under Section 564(b)(1) of the Act, 21 U.S.C. section 360bbb-3(b)(1), unless the authorization is terminated or revoked sooner.     Influenza A by PCR NEGATIVE NEGATIVE Final   Influenza B by PCR NEGATIVE NEGATIVE Final    Comment: (NOTE) The Xpert Xpress SARS-CoV-2/FLU/RSV assay is intended as an aid in  the diagnosis of influenza from Nasopharyngeal swab specimens and  should not be used as a sole basis for treatment. Nasal washings and  aspirates are unacceptable for Xpert Xpress SARS-CoV-2/FLU/RSV  testing.  Fact Sheet for Patients: PinkCheek.be  Fact Sheet for Healthcare Providers: GravelBags.it  This test is not yet approved or cleared by the [place] FDA and  has been authorized for detection and/or diagnosis of SARS-CoV-2 by  FDA under an Emergency Use Authorization (EUA). This EUA will  remain  in effect (meaning this test can be used) for the duration of the  Covid-19 declaration under Section 564(b)(1) of the Act, 21  U.S.C. section 360bbb-3(b)(1),  unless the authorization is  terminated or revoked. Performed at [facility], [place] [address] [place], [place] [address]     Procedures and diagnostic studies:  No results found.  Medications:   . allopurinol  200 mg Oral Daily  . amLODipine  5 mg Oral Daily  . atorvastatin  20 mg Oral Daily  . pantoprazole (PROTONIX) IV  40 mg Intravenous Q12H  . tamsulosin  0.4 mg Oral Daily   Continuous Infusions: . sodium chloride    . lactated ringers 100 mL/hr at 11/16/19 1055     LOS: 2 days   Geradine Girt  [facility]   How to contact the [facility] Attending or Consulting provider [address] or covering provider during after hours [place], for this patient?  1. Check the care team in [facility] and look for a) attending/consulting [facility] provider listed and b) the [facility] team listed 2. Log into [email or web address] and use [place]'s universal password to access. If you do not have the password, please contact the hospital operator. 3. Locate the [facility] provider you are looking for under [facility] and page to a number that you can be directly reached. 4. If you still have difficulty reaching the provider, please page the [facility] (Director on Call) for the Hospitalists listed on amion for assistance.  11/16/2019, 3:14 PM

## 2019-11-16 NOTE — Anesthesia Preprocedure Evaluation (Addendum)
Anesthesia Evaluation  Patient identified by MRN, date of birth, ID band Patient awake    Reviewed: Allergy & Precautions, NPO status , Patient's Chart, lab work & pertinent test results, reviewed documented beta blocker date and time   History of Anesthesia Complications Negative for: history of anesthetic complications  Airway Mallampati: II  TM Distance: >3 FB Neck ROM: Full    Dental  (+) Missing, Caps, Dental Advisory Given   Pulmonary sleep apnea and Continuous Positive Airway Pressure Ventilation , former smoker,  11/14/2019 SARS coronavirus NEG   breath sounds clear to auscultation       Cardiovascular hypertension, Pt. on medications and Pt. on home beta blockers (-) angina+ CAD, + Cardiac Stents, + CABG and + DVT  + dysrhythmias Atrial Fibrillation + pacemaker  Rhythm:Regular Rate:Normal  '18 ECHO: EF 40-45%, mod TR   Neuro/Psych negative neurological ROS     GI/Hepatic Neg liver ROS, GERD  Medicated and Controlled,  Endo/Other    Renal/GU Renal InsufficiencyRenal disease (creat 3.51)S/p nephrectomy     Musculoskeletal  (+) Arthritis ,   Abdominal   Peds  Hematology  (+) Blood dyscrasia (Hb 7.6), anemia , Xarelto   Anesthesia Other Findings   Reproductive/Obstetrics                            Anesthesia Physical Anesthesia Plan  ASA: III  Anesthesia Plan: MAC   Post-op Pain Management:    Induction:   PONV Risk Score and Plan: 1 and Ondansetron and Treatment may vary due to age or medical condition  Airway Management Planned: Natural Airway and Nasal Cannula  Additional Equipment: None  Intra-op Plan:   Post-operative Plan:   Informed Consent: I have reviewed the patients History and Physical, chart, labs and discussed the procedure including the risks, benefits and alternatives for the proposed anesthesia with the patient or authorized representative who has  indicated his/her understanding and acceptance.   Patient has DNR.  Discussed DNR with patient and Suspend DNR.   Dental advisory given  Plan Discussed with: CRNA and Surgeon  Anesthesia Plan Comments:        Anesthesia Quick Evaluation

## 2019-11-16 NOTE — Transfer of Care (Signed)
Immediate Anesthesia Transfer of Care Note  Patient: Paul Price  Procedure(s) Performed: ESOPHAGOGASTRODUODENOSCOPY (EGD) (N/A )  Patient Location: PACU  Anesthesia Type:MAC  Level of Consciousness: awake and alert   Airway & Oxygen Therapy: Patient Spontanous Breathing and Patient connected to nasal cannula oxygen  Post-op Assessment: Report given to RN and Post -op Vital signs reviewed and stable  Post vital signs: Reviewed and stable  Last Vitals:  Vitals Value Taken Time  BP 127/56 11/16/19 1116  Temp 36.1 C 11/16/19 1115  Pulse 86 11/16/19 1123  Resp 22 11/16/19 1123  SpO2 97 % 11/16/19 1123  Vitals shown include unvalidated device data.  Last Pain:  Vitals:   11/16/19 1115  TempSrc:   PainSc: 0-No pain         Complications: No complications documented.

## 2019-11-16 NOTE — Brief Op Note (Signed)
Minimal duodenitis and otherwise normal EGD. No source of melena seen. Capsule endoscopy started and study should be ready to be read tomorrow. Diet recs per capsule endoscopy orders. Will f/u. D/W patient and wife in room.

## 2019-11-16 NOTE — Progress Notes (Signed)
Pt refused to wear CPAP tonight. He said he is doing well without it.

## 2019-11-16 NOTE — Anesthesia Procedure Notes (Signed)
Procedure Name: MAC Date/Time: 11/16/2019 10:56 AM Performed by: Inda Coke, CRNA Pre-anesthesia Checklist: Patient identified, Emergency Drugs available, Suction available, Timeout performed and Patient being monitored Patient Re-evaluated:Patient Re-evaluated prior to induction Oxygen Delivery Method: Nasal cannula Induction Type: IV induction Dental Injury: Teeth and Oropharynx as per pre-operative assessment

## 2019-11-16 NOTE — Progress Notes (Addendum)
Nephrology Follow-Up Consult note   Assessment/Recommendations: Paul Price is a/an 79 y.o. male with a past medical history significant for OSA, HTN, CKD, HLD, afib, CAD, history of nephrectomy  who present w/ anemia  Nonoliguric AKI on CKD: Baseline kidney dysfunction likely related comorbidities as well as nephrectomy about 7 months ago.  Baseline creatinine is around 3-3.5.  Crt back to baseline of 3.5 -Continue MIVFs when NPO -Anemia mgmt as below -Hold lasix -No overt signs of uremia -No indication for dialysis -Continue to monitor daily Cr, Dose meds for GFR -Monitor Daily I/Os, Daily weight  -Maintain MAP>65 for optimal renal perfusion.  -Avoid nephrotoxic medications including NSAIDs and Vanc/Zosyn combo  Anemia secondary to blood loss and chronic kidney disease: Suspicious for upper GI bleed.  In the setting of being on Xarelto with significant decrease in his GFR likely contributing. S/p 2 units of PRBCs -GI to evaluate, appreciate assistance, plan for EGD today -On Protonix -Transfusions per primary team with goal hemoglobin greater than 7  Hypertension: History of such with recent hypotension -Hold Lasix and metoprolol in the setting of concern for active GI bleeding -Can continue amlodipine 5 mg daily but hold for hypotension -Restart meds if stable tomorrow  Atrial fibrillation: On Xarelto.  Suboptimal medication given [name].  Would hold at this time and if anticoagulation is needed in the distant future would consider Eliquis instead.  Crt has improved back to his baseline. We will sign off. Please contact nephrology for any further questions.  Recommendations conveyed to primary service.    [facility] 11/16/2019 8:25 AM  ___________________________________________________________  CC: Anemia  Interval History/Subjective: Patient feels well hoping to go home soon.  No changes to his urine output.  Plan for EGD  today   Medications:  Current Facility-Administered Medications  Medication Dose Route Frequency Provider Last Rate Last Admin  . 0.9 %  sodium chloride infusion  10 mL/hr Intravenous Once Karmen Bongo, MD      . 0.9 %  sodium chloride infusion   Intravenous Continuous Wilford Corner, MD 20 mL/hr at 11/16/19 0056 New Bag at 11/16/19 0056  . acetaminophen (TYLENOL) tablet 650 mg  650 mg Oral Q6H PRN Karmen Bongo, MD       Or  . acetaminophen (TYLENOL) suppository 650 mg  650 mg Rectal Q6H PRN Karmen Bongo, MD      . allopurinol (ZYLOPRIM) tablet 200 mg  200 mg Oral Daily Vann, Jessica U, DO      . amLODipine (NORVASC) tablet 5 mg  5 mg Oral Daily Karmen Bongo, MD   5 mg at 11/15/19 0902  . atorvastatin (LIPITOR) tablet 20 mg  20 mg Oral Daily Karmen Bongo, MD   20 mg at 11/15/19 0902  . calcium carbonate (dosed in mg elemental calcium) suspension 500 mg of elemental calcium  500 mg of elemental calcium Oral Q6H PRN Karmen Bongo, MD      . camphor-menthol [facility]) lotion 1 application  1 application Topical B0F PRN Karmen Bongo, MD       And  . hydrOXYzine (ATARAX/VISTARIL) tablet 25 mg  25 mg Oral Q8H PRN Karmen Bongo, MD      . docusate sodium [facility]) enema 283 mg  1 enema Rectal PRN Karmen Bongo, MD      . feeding supplement (NEPRO CARB STEADY) liquid 237 mL  237 mL Oral TID PRN Karmen Bongo, MD      . hydrALAZINE (APRESOLINE) injection 5 mg  5 mg Intravenous Q4H PRN Karmen Bongo, MD      . lactated ringers infusion   Intravenous Continuous Karmen Bongo, MD 100 mL/hr at 11/15/19 1816 New Bag at 11/15/19 1816  . ondansetron (ZOFRAN) tablet 4 mg  4 mg Oral Q6H PRN Karmen Bongo, MD       Or  . ondansetron [facility]) injection 4 mg  4 mg Intravenous Q6H PRN Karmen Bongo, MD      . pantoprazole (PROTONIX) injection 40 mg  40 mg Intravenous Lillia Mountain, MD   40 mg at 11/15/19 2130  . sorbitol 70 % solution 30 mL  30 mL Oral PRN Karmen Bongo, MD      . tamsulosin [facility]) capsule 0.4 mg  0.4 mg Oral Daily Karmen Bongo, MD   0.4 mg at 11/15/19 0902  . zolpidem (AMBIEN) tablet 5 mg  5 mg Oral QHS PRN Karmen Bongo, MD          Review of Systems: 10 systems reviewed and negative except per interval history/subjective  Physical Exam: Vitals:   11/16/19 0526 11/16/19 0815  BP: (!) 147/62 (!) 147/59  Pulse: 85 85  Resp: 17   Temp: 97.9 F (36.6 C)   SpO2: 98%    No intake/output data recorded.  Intake/Output Summary (Last 24 hours) at 11/16/2019 0825 Last data filed at 11/16/2019 0725 Gross per 24 hour  Intake 2491.49 ml  Output 1400 ml  Net 1091.49 ml   Constitutional: well-appearing, no acute distress ENMT: ears and nose without scars or lesions, MMM CV: normal rate, no edema Respiratory: Bilateral chest rise, normal work of breathing Gastrointestinal: soft, non-tender, no palpable masses or hernias Skin: no visible lesions or rashes Psych: alert, judgement/insight appropriate, appropriate mood and affect   Test Results I personally reviewed new and old clinical labs and radiology tests Lab Results  Component Value Date   NA 143 11/15/2019   K 4.6 11/15/2019   CL 111 11/15/2019   CO2 21 (L) 11/15/2019   BUN 95 (H) 11/15/2019   CREATININE 4.08 (H) 11/15/2019   CALCIUM 8.8 (L) 11/15/2019   ALBUMIN 3.2 (L) 11/14/2019   PHOS 2.8 11/04/2019

## 2019-11-16 NOTE — Anesthesia Postprocedure Evaluation (Signed)
Anesthesia Post Note  Patient: Paul Price  Procedure(s) Performed: ESOPHAGOGASTRODUODENOSCOPY (EGD) (N/A ) GIVENS CAPSULE STUDY (N/A )     Patient location during evaluation: Endoscopy Anesthesia Type: MAC Level of consciousness: sedated, patient cooperative and oriented Pain management: pain level controlled Vital Signs Assessment: post-procedure vital signs reviewed and stable Respiratory status: spontaneous breathing, nonlabored ventilation and respiratory function stable Cardiovascular status: blood pressure returned to baseline and stable Postop Assessment: no apparent nausea or vomiting Anesthetic complications: no   No complications documented.  Last Vitals:  Vitals:   11/16/19 1145 11/16/19 1213  BP: 138/60 (!) 143/62  Pulse: 85 88  Resp: 19 20  Temp: (!) 36.3 C 36.8 C  SpO2: 99% 100%    Last Pain:  Vitals:   11/16/19 1226  TempSrc:   PainSc: 0-No pain                 Kruti Horacek,E. Aminta Sakurai

## 2019-11-16 NOTE — Interval H&P Note (Signed)
History and Physical Interval Note:  11/16/2019 10:49 AM  Paul Price  has presented today for surgery, with the diagnosis of Gi bleed.  The various methods of treatment have been discussed with the patient and family. After consideration of risks, benefits and other options for treatment, the patient has consented to  Procedure(s): ESOPHAGOGASTRODUODENOSCOPY (EGD) (N/A) as a surgical intervention.  The patient's history has been reviewed, patient examined, no change in status, stable for surgery.  I have reviewed the patient's chart and labs.  Questions were answered to the patient's satisfaction.     Lear Ng

## 2019-11-17 ENCOUNTER — Encounter (HOSPITAL_COMMUNITY): Payer: Self-pay | Admitting: Gastroenterology

## 2019-11-17 LAB — CBC
HCT: 26.1 % — ABNORMAL LOW (ref 39.0–52.0)
Hemoglobin: 7.7 g/dL — ABNORMAL LOW (ref 13.0–17.0)
MCH: 28.3 pg (ref 26.0–34.0)
MCHC: 29.5 g/dL — ABNORMAL LOW (ref 30.0–36.0)
MCV: 96 fL (ref 80.0–100.0)
Platelets: 144 10*3/uL — ABNORMAL LOW (ref 150–400)
RBC: 2.72 MIL/uL — ABNORMAL LOW (ref 4.22–5.81)
RDW: 22.5 % — ABNORMAL HIGH (ref 11.5–15.5)
WBC: 8.8 10*3/uL (ref 4.0–10.5)
nRBC: 0 % (ref 0.0–0.2)

## 2019-11-17 LAB — BASIC METABOLIC PANEL
Anion gap: 9 (ref 5–15)
BUN: 58 mg/dL — ABNORMAL HIGH (ref 8–23)
CO2: 21 mmol/L — ABNORMAL LOW (ref 22–32)
Calcium: 8.7 mg/dL — ABNORMAL LOW (ref 8.9–10.3)
Chloride: 112 mmol/L — ABNORMAL HIGH (ref 98–111)
Creatinine, Ser: 3.22 mg/dL — ABNORMAL HIGH (ref 0.61–1.24)
GFR, Estimated: 19 mL/min — ABNORMAL LOW (ref >60)
Glucose, Bld: 115 mg/dL — ABNORMAL HIGH (ref 70–99)
Potassium: 4.5 mmol/L (ref 3.5–5.1)
Sodium: 142 mmol/L (ref 135–145)

## 2019-11-17 MED ORDER — FUROSEMIDE 40 MG PO TABS
20.0000 mg | ORAL_TABLET | Freq: Every day | ORAL | Status: DC
Start: 2019-11-17 — End: 2020-02-12

## 2019-11-17 MED ORDER — RIVAROXABAN 15 MG PO TABS
15.0000 mg | ORAL_TABLET | Freq: Every day | ORAL | Status: DC
Start: 2019-11-19 — End: 2020-01-26

## 2019-11-17 MED ORDER — APIXABAN 2.5 MG PO TABS: 2.5000 mg | ORAL_TABLET | Freq: Two times a day (BID) | ORAL | 0 refills | Status: DC

## 2019-11-17 NOTE — Discharge Summary (Addendum)
Physician Discharge Summary  Paul Price XNT:[id number] DOB: December 15, 1940 DOA: 11/14/2019  PCP: [name], MD  Admit date: 11/14/2019 Discharge date: 11/17/2019  Admitted From: home Discharge disposition: home   Recommendations for Outpatient Follow-Up:   1. Consider changing to eliquis from xarelto due to renal function (copay while patient in donut hole is 131$- has already used a coupon) 2. Continue IV Fe and epo through nephrology 3. 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  Diet recommendation: Low sodium, heart healthy  Wound care: None.  Code status: Full.   History of Present Illness:   Paul Price is a 79 y.o. 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  He was last admitted for GI bleeding in 08/2016 with really unremarkable EGD with scant gastritis; colonoscopy with diverticulosis and hemorrhoids without apparent bleeding.  He reports that 2 days ago he was feeling weak and checked his BP and it read 90/40.  He took it easy but didn't have any energy.  He checked his BP again today, 90/45.  He still felt tired and so called his PCP and he was seen in the office today.  His Hgb was low, 5.9.  His kidney function was also worse than usual.  He is on Lasix for fluid and he has been having edema; he was increased form 20 to 40 mg about 2 weeks ago.  He has not noticed any blood in his stools.  He  has not been on dialysis in the past.  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  His other kidney has not been doing well.  He is coming in every other week for epo injections, due Tuesday.  He is due to see vascular soon.  He has also had a few iron infusions.  No abdominal pain.    Hospital Course by Problem:   Symptomatic anemia -Patient with priorremote history (2018) for GI bleeding, thought to be related to gastritis; he required transfusion during that admission -He also has stage 5 CKD, for which he receives twice weekly Epo infusions as well as periodic iron infusions -He is not presenting with fatigue and generalized weakness; found to have a substantial decrease in Hgb since 10/26 -Appears to be primarily ABLA with superimposed anemia of chronic disease associated with renal disease -Heme testing waspositive -Transfused 2 units PRBC   Suspected Upper GI Bleeding -GI consulted -s/p EGD -s/p capsule endoscopy: mild gastritis and duodenitis but no other small bowel abnormalities. No source of anemia.   Stage 5 CKD -Patient has advanced CKD at baseline -He was previously evaluated for PD and was determined to not be a candidate -He is scheduled to see vascular in early December to consider AV fistula placement in anticipation of future need for HD -Creatinine is currently increased from 3.24 ->  4.58; this may be associated with volume depletion and/or recent increase in Lasix dosing -However, BUN has essentially doubled from 53 -> 102, further indicating probable upper GI blood loss -He is s/p R nephrectomy due to [place] about 7 months ago -appreciated nephrology consult- CR improved and they have signed off  CAD -He is s/p CABG -He is not on ASA due to [facility]  Chronic combined CHF -His Lasix was increased from 20 to 40 mg daily recently -resume at a lower dose  -No current concern for decompensation -Last echo was in 2018 and  showed EF 40-45% with prior h/o grade 2 diastolic dysfunction  HTN -norvasc  -Continue Toprol XL  HLD -Continue Lipitor -Hold Lofibra while in hospital  Afib  -He is rate controlled with Toprol XL  - need to consider change to elquis if Cr remains poor  OSA on CPAP -Continue CPAP    Medical Consultants:   GI   Discharge Exam:   Vitals:   11/17/19 0931 11/17/19 1218  BP: (!) 150/68 (!) 135/59  Pulse: (!) 105 (!) 106  Resp:  16  Temp: 98.6 F (37 C) 99 F (37.2 C)  SpO2:  96%   Vitals:   11/17/19 0026 11/17/19 0458 11/17/19 0931 11/17/19 1218  BP: (!) 154/65 (!) 150/65 (!) 150/68 (!) 135/59  Pulse: 97 90 (!) 105 (!) 106  Resp: 20 18  16   Temp: 99.1 F (37.3 C) 98.6 F (37 C) 98.6 F (37 C) 99 F (37.2 C)  TempSrc: Oral Oral Oral Oral  SpO2: 96% 100%  96%  Weight:      Height:        General exam: Appears calm and comfortable.   The results of significant diagnostics from this hospitalization (including imaging, microbiology, ancillary and laboratory) are listed below for reference.     Procedures and Diagnostic Studies:   No results found.   Labs:   Basic Metabolic Panel: Recent Labs  Lab 11/14/19 1306 11/14/19 1306 11/15/19 0616 11/15/19 0616 11/16/19 0901 11/17/19 0418  NA 140  --  143  --  143 142  K 5.2*   < > 4.6   < > 4.3 4.5  CL 107  --  111  --  112* 112*  CO2 21*  --  21*  --  21* 21*  GLUCOSE 135*  --  105*  --  124* 115*  BUN 102*  --  95*  --  71* 58*  CREATININE 4.58*  --  4.08*  --  3.51* 3.22*  CALCIUM 8.9  --  8.8*  --  9.0 8.7*  PHOS  --   --   --   --  3.8  --    < > = values in this interval not displayed.   GFR Estimated Creatinine Clearance: 18.1 mL/min (A) (by C-G formula based on SCr of 3.22 mg/dL (H)). Liver Function Tests: Recent Labs  Lab 11/14/19 1306 11/16/19 0901  AST 31  --   ALT 13  --   ALKPHOS 88  --   BILITOT 0.4  --   PROT 5.9*  --   ALBUMIN 3.2* 2.9*   No results for input(s):  LIPASE, AMYLASE in the last 168 hours. No results for input(s): AMMONIA in the last 168 hours. Coagulation profile Recent Labs  Lab 11/14/19 1306  INR 2.6*    CBC: Recent Labs  Lab 11/14/19 1306 11/14/19 1306 11/14/19 2041 11/15/19 0616 11/15/19 1641 11/16/19 0414 11/17/19 0418  WBC  9.7   < > 10.3 10.1 9.2 8.8 8.8  NEUTROABS 8.0*  --   --   --   --   --   --   HGB 6.2*   < > 6.9* 7.8* 8.0* 7.6* 7.7*  HCT 21.2*   < > 22.7* 25.8* 25.5* 24.6* 26.1*  MCV 98.6   < > 91.5 90.8 91.7 93.2 96.0  PLT 174   < > 144* 146* 141* 140* 144*   < > = values in this interval not displayed.   Cardiac Enzymes: No results for input(s): CKTOTAL, CKMB, CKMBINDEX, TROPONINI in the last 168 hours. BNP: Invalid input(s): POCBNP CBG: No results for input(s): GLUCAP in the last 168 hours. D-Dimer No results for input(s): DDIMER in the last 72 hours. Hgb A1c No results for input(s): HGBA1C in the last 72 hours. Lipid Profile No results for input(s): CHOL, HDL, LDLCALC, TRIG, CHOLHDL, LDLDIRECT in the last 72 hours. Thyroid function studies No results for input(s): TSH, T4TOTAL, T3FREE, THYROIDAB in the last 72 hours.  Invalid input(s): FREET3 Anemia work up No results for input(s): VITAMINB12, FOLATE, FERRITIN, TIBC, IRON, RETICCTPCT in the last 72 hours. Microbiology Recent Results (from the past 240 hour(s))  Respiratory Panel by RT PCR (Flu A&B, Covid) - Nasopharyngeal Swab     Status: None   Collection Time: 11/14/19  3:54 PM   Specimen: Nasopharyngeal Swab  Result Value Ref Range Status   SARS Coronavirus 2 by RT PCR NEGATIVE NEGATIVE Final    Comment: (NOTE) SARS-CoV-2 target nucleic acids are NOT DETECTED.  The SARS-CoV-2 RNA is generally detectable in upper respiratoy specimens during the acute phase of infection. The lowest concentration of SARS-CoV-2 viral copies this assay can detect is 131 copies/mL. A negative result does not preclude SARS-Cov-2 infection and should not be  used as the sole basis for treatment or other patient management decisions. A negative result may occur with  improper specimen collection/handling, submission of specimen other than nasopharyngeal swab, presence of viral mutation(s) within the areas targeted by this assay, and inadequate number of viral copies (<131 copies/mL). A negative result must be combined with clinical observations, patient history, and epidemiological information. The expected result is Negative.  Fact Sheet for Patients:  PinkCheek.be  Fact Sheet for Healthcare Providers:  GravelBags.it  This test is no t yet approved or cleared by the [place] FDA and  has been authorized for detection and/or diagnosis of SARS-CoV-2 by FDA under an Emergency Use Authorization (EUA). This EUA will remain  in effect (meaning this test can be used) for the duration of the COVID-19 declaration under Section 564(b)(1) of the Act, 21 U.S.C. section 360bbb-3(b)(1), unless the authorization is terminated or revoked sooner.     Influenza A by PCR NEGATIVE NEGATIVE Final   Influenza B by PCR NEGATIVE NEGATIVE Final    Comment: (NOTE) The Xpert Xpress SARS-CoV-2/FLU/RSV assay is intended as an aid in  the diagnosis of influenza from Nasopharyngeal swab specimens and  should not be used as a sole basis for treatment. Nasal washings and  aspirates are unacceptable for Xpert Xpress SARS-CoV-2/FLU/RSV  testing.  Fact Sheet for Patients: PinkCheek.be  Fact Sheet for Healthcare Providers: GravelBags.it  This test is not yet approved or cleared by the [place] FDA and  has been authorized for detection and/or diagnosis of SARS-CoV-2 by  FDA under an Emergency Use Authorization (EUA). This EUA will remain  in effect (meaning this test can be used) for the duration of  the  Covid-19 declaration under Section  564(b)(1) of the Act, 21  U.S.C. section 360bbb-3(b)(1), unless the authorization is  terminated or revoked. Performed at [facility], [place] [address], [place], [place] [address]      Discharge Instructions:   Discharge Instructions    Diet - low sodium heart healthy   Complete by: As directed    Increase activity slowly   Complete by: As directed      Allergies as of 11/17/2019      Reactions   Nitrostat [nitroglycerin] Other (See Comments)   Causes blood pressure to "bottom out"      Medication List    TAKE these medications   acetaminophen 500 MG tablet Commonly known as: TYLENOL Take 500-1,000 mg by mouth every 6 (six) hours as needed for mild pain, moderate pain or headache.   allopurinol 300 MG tablet Commonly known as: ZYLOPRIM Take 300 mg by mouth daily.   amLODipine 5 MG tablet Commonly known as: NORVASC Take 2.5 mg by mouth daily.   atorvastatin 20 MG tablet Commonly known as: LIPITOR TAKE 1 TABLET BY MOUTH DAILY   b complex vitamins tablet Take 1 tablet by mouth daily.   fenofibrate micronized 134 MG capsule Commonly known as: LOFIBRA Take 134 mg by mouth daily before breakfast.   furosemide 40 MG tablet Commonly known as: LASIX Take 0.5 tablets (20 mg total) by mouth daily. What changed: how much to take   metoprolol succinate 25 MG 24 hr tablet Commonly known as: TOPROL-XL TAKE 1 TABLET BY MOUTH DAILY   PriLOSEC OTC 20 MG tablet Generic drug: omeprazole Take 20 mg by mouth daily before breakfast.   Rivaroxaban 15 MG Tabs tablet Commonly known as: XARELTO Take 1 tablet (15 mg total) by mouth daily after supper. Start taking on: November 19, 2019 What changed:   when to take this  These instructions start on November 19, 2019. If you are unsure what to do until then, ask your doctor or other care provider.   tamsulosin 0.4 MG Caps capsule Commonly known as: Flomax Take 1 capsule (0.4 mg total) by mouth daily.          Time coordinating discharge: 35 min  Signed:  Geradine Girt DO  [facility] 11/17/2019, 1:55 PM

## 2019-11-17 NOTE — Progress Notes (Signed)
Patient ID: Paul Price, male   DOB: 11-22-1940, 79 y.o.   MRN: [id number]  Capsule endoscopy (full report to be scanned in) showed mild gastritis and duodenitis but no other small bowel abnormalities. No source of anemia. Continue iron supplementation as outpt (infusions and/or oral therapy). Repeat colonoscopy as outpt if anemia persists. Needs to see hematology as an outpt. F/U with me in 4-6 weeks. Will sign off. Call if questions.

## 2019-11-17 NOTE — Care Management Important Message (Signed)
Important Message  Patient Details  Name: Paul Price MRN: [id number] Date of Birth: 27-Jun-1940   Medicare Important Message Given:  Yes     Kimberley Speece P Tymara Saur 11/17/2019, 2:06 PM

## 2019-11-17 NOTE — Progress Notes (Signed)
Pt IV removed, catheter intact. Pt understands d/c instructions. Pt has all belongings. Pt d/c via wheelchair with NT.

## 2019-11-18 ENCOUNTER — Ambulatory Visit (HOSPITAL_COMMUNITY)
Admission: RE | Admit: 2019-11-18 | Discharge: 2019-11-18 | Disposition: A | Discharge status: Home / Self Care | Payer: Medicare Other | Source: Ambulatory Visit | Attending: Nephrology | Admitting: Nephrology

## 2019-11-18 ENCOUNTER — Other Ambulatory Visit: Payer: Self-pay

## 2019-11-18 VITALS — BP 145/55 | HR 104 | Temp 98.3°F | Resp 20

## 2019-11-18 DIAGNOSIS — D631 Anemia in chronic kidney disease: Secondary | ICD-10-CM | POA: Diagnosis not present

## 2019-11-18 DIAGNOSIS — N185 Chronic kidney disease, stage 5: Secondary | ICD-10-CM | POA: Diagnosis not present

## 2019-11-18 LAB — POCT HEMOGLOBIN-HEMACUE: Hemoglobin: 8.4 g/dL — ABNORMAL LOW (ref 13.0–17.0)

## 2019-11-18 MED ORDER — EPOETIN ALFA-EPBX 10000 UNIT/ML IJ SOLN
INTRAMUSCULAR | Status: AC
  Administered 2019-11-18: 20000 [IU] via SUBCUTANEOUS
  Filled 2019-11-18: qty 2

## 2019-11-18 MED ORDER — EPOETIN ALFA-EPBX 10000 UNIT/ML IJ SOLN: 20000.0000 [IU] | INTRAMUSCULAR | Status: DC

## 2019-12-01 ENCOUNTER — Other Ambulatory Visit: Payer: Self-pay | Admitting: *Deleted

## 2019-12-01 DIAGNOSIS — N186 End stage renal disease: Secondary | ICD-10-CM

## 2019-12-02 ENCOUNTER — Other Ambulatory Visit: Payer: Self-pay

## 2019-12-02 ENCOUNTER — Encounter (HOSPITAL_COMMUNITY)
Admission: RE | Admit: 2019-12-02 | Discharge: 2019-12-02 | Disposition: A | Discharge status: Home / Self Care | Payer: Medicare Other | Source: Ambulatory Visit | Attending: Nephrology | Admitting: Nephrology

## 2019-12-02 VITALS — BP 151/59 | HR 60 | Temp 97.8°F | Resp 20

## 2019-12-02 DIAGNOSIS — N185 Chronic kidney disease, stage 5: Secondary | ICD-10-CM | POA: Diagnosis not present

## 2019-12-02 DIAGNOSIS — D631 Anemia in chronic kidney disease: Secondary | ICD-10-CM | POA: Insufficient documentation

## 2019-12-02 LAB — RENAL FUNCTION PANEL
Albumin: 3.4 g/dL — ABNORMAL LOW (ref 3.5–5.0)
Anion gap: 11 (ref 5–15)
BUN: 52 mg/dL — ABNORMAL HIGH (ref 8–23)
CO2: 22 mmol/L (ref 22–32)
Calcium: 9.3 mg/dL (ref 8.9–10.3)
Chloride: 108 mmol/L (ref 98–111)
Creatinine, Ser: 3.48 mg/dL — ABNORMAL HIGH (ref 0.61–1.24)
GFR, Estimated: 17 mL/min — ABNORMAL LOW (ref >60)
Glucose, Bld: 121 mg/dL — ABNORMAL HIGH (ref 70–99)
Phosphorus: 3.9 mg/dL (ref 2.5–4.6)
Potassium: 4.4 mmol/L (ref 3.5–5.1)
Sodium: 141 mmol/L (ref 135–145)

## 2019-12-02 LAB — CBC WITH DIFFERENTIAL/PLATELET
Abs Immature Granulocytes: 0.02 10*3/uL (ref 0.00–0.07)
Basophils Absolute: 0 10*3/uL (ref 0.0–0.1)
Basophils Relative: 1 %
Eosinophils Absolute: 0.2 10*3/uL (ref 0.0–0.5)
Eosinophils Relative: 3 %
HCT: 28.8 % — ABNORMAL LOW (ref 39.0–52.0)
Hemoglobin: 8.3 g/dL — ABNORMAL LOW (ref 13.0–17.0)
Immature Granulocytes: 0 %
Lymphocytes Relative: 12 %
Lymphs Abs: 0.8 10*3/uL (ref 0.7–4.0)
MCH: 27.2 pg (ref 26.0–34.0)
MCHC: 28.8 g/dL — ABNORMAL LOW (ref 30.0–36.0)
MCV: 94.4 fL (ref 80.0–100.0)
Monocytes Absolute: 0.3 10*3/uL (ref 0.1–1.0)
Monocytes Relative: 5 %
Neutro Abs: 5 10*3/uL (ref 1.7–7.7)
Neutrophils Relative %: 79 %
Platelets: 199 10*3/uL (ref 150–400)
RBC: 3.05 MIL/uL — ABNORMAL LOW (ref 4.22–5.81)
RDW: 20.8 % — ABNORMAL HIGH (ref 11.5–15.5)
WBC: 6.3 10*3/uL (ref 4.0–10.5)
nRBC: 0 % (ref 0.0–0.2)

## 2019-12-02 LAB — IRON AND TIBC
Iron: 32 ug/dL — ABNORMAL LOW (ref 45–182)
Saturation Ratios: 10 % — ABNORMAL LOW (ref 17.9–39.5)
TIBC: 325 ug/dL (ref 250–450)
UIBC: 293 ug/dL

## 2019-12-02 LAB — FERRITIN: Ferritin: 41 ng/mL (ref 24–336)

## 2019-12-02 LAB — POCT HEMOGLOBIN-HEMACUE: Hemoglobin: 8.4 g/dL — ABNORMAL LOW (ref 13.0–17.0)

## 2019-12-02 MED ORDER — EPOETIN ALFA-EPBX 10000 UNIT/ML IJ SOLN
INTRAMUSCULAR | Status: AC
  Filled 2019-12-02: qty 2

## 2019-12-02 MED ORDER — EPOETIN ALFA-EPBX 10000 UNIT/ML IJ SOLN
20000.0000 [IU] | INTRAMUSCULAR | Status: DC
  Administered 2019-12-02: 20000 [IU] via SUBCUTANEOUS

## 2019-12-10 ENCOUNTER — Ambulatory Visit (INDEPENDENT_AMBULATORY_CARE_PROVIDER_SITE_OTHER)
Admission: RE | Admit: 2019-12-10 | Discharge: 2019-12-10 | Disposition: A | Discharge status: Home / Self Care | Payer: Medicare Other | Source: Ambulatory Visit | Attending: Vascular Surgery | Admitting: Vascular Surgery

## 2019-12-10 ENCOUNTER — Ambulatory Visit (INDEPENDENT_AMBULATORY_CARE_PROVIDER_SITE_OTHER): Payer: Medicare Other | Admitting: Vascular Surgery

## 2019-12-10 ENCOUNTER — Other Ambulatory Visit: Payer: Self-pay

## 2019-12-10 ENCOUNTER — Ambulatory Visit (HOSPITAL_COMMUNITY)
Admission: RE | Admit: 2019-12-10 | Discharge: 2019-12-10 | Disposition: A | Discharge status: Home / Self Care | Payer: Medicare Other | Source: Ambulatory Visit | Attending: Vascular Surgery | Admitting: Vascular Surgery

## 2019-12-10 ENCOUNTER — Encounter: Payer: Self-pay | Admitting: Vascular Surgery

## 2019-12-10 VITALS — BP 152/74 | HR 66 | Temp 97.3°F | Resp 20 | Ht 66.0 in | Wt 186.0 lb

## 2019-12-10 DIAGNOSIS — N186 End stage renal disease: Secondary | ICD-10-CM

## 2019-12-10 DIAGNOSIS — N185 Chronic kidney disease, stage 5: Secondary | ICD-10-CM | POA: Diagnosis not present

## 2019-12-10 NOTE — Progress Notes (Addendum)
REASON FOR CONSULT:    To evaluate for placement of an AV fistula.  The consult is requested by Dr. Marval Regal.   ASSESSMENT & PLAN:   STAGE V CHRONIC KIDNEY DISEASE: This patient will likely require placement of a right AV graft. His veins do not appear adequate in either arm for AV fistula. We were asked not to place an AV graft if an AV fistula were not possible. He has a pacemaker on the left side so when we hear from nephrology we will schedule him for a right arm AV graft. I have explained the indications for placement of an AV fistula or AV graft. I've explained that if at all possible we will place an AV fistula.  I have reviewed the risks of placement of an AV fistula including but not limited to: failure of the fistula to mature, need for subsequent interventions, and thrombosis. In addition I have reviewed the potential complications of placement of an AV graft. These risks include, but are not limited to, graft thrombosis, graft infection, wound healing problems, bleeding, arm swelling, and steal syndrome. All the patient's questions were answered and they are agreeable to proceed with surgery. We will wait to hear from Dr. Marval Regal as to when he is ready for [place] to proceed with placement of a right arm graft. Of note we would need to hold his Xarelto for 48 hours prior to the procedure.  Deitra Mayo, MD Office: [phone]   HPI:   Paul Price is a pleasant 79 y.o. male, who is referred for evaluation for hemodialysis access.  Of note on the referring note it stated that if he was not a candidate for a fistula we should wait before placing an AV graft.  I have reviewed the records from the referring office.  The patient has stage V chronic kidney disease.  This is secondary to hypertension and nonsteroidal anti-inflammatories.  He has had progressive decline of his kidney function. He also tells me that he had a right nephrectomy for a tumor.  He denies any recent uremic  symptoms. Specifically, he denies nausea, vomiting, fatigue, or anorexia.  He is right-handed. 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  Past Medical History:  Diagnosis Date  . Arthritis    "shoulders" (09/07/2016)  . Atrial fibrillation ([place])   . BPH (benign prostatic hyperplasia)   . Chronic kidney disease (CKD), stage III (moderate) (HCC)   . Coronary artery disease   . DDD (degenerative disc disease), cervical   . DVT (deep venous thrombosis) ([place]) 12/2013   LLE  . GERD (gastroesophageal reflux disease)   . Gout   . High cholesterol   . History of blood transfusion 09/06/2016   2 u PRBC  . History of scarlet fever 1940s  . History of stress test 06/2011   No significant ischemia, this is a low risk scan. Clinical correlation recommended Abnormal myocardial perfusion study.  Marland Kitchen Hx of echocardiogram 05/2009   EF 40-45%, he did have mild annular calcification with mild-to-moderate MR and mild TR as well as aortic valve sclerosis. Estimated RV systolic pressure was 21 mm.  . Hypertension   . Iron deficiency anemia   . Kidney failure   . 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 Neck pain    "not chronic" (09/07/2016)  . Obesity   . OSA on CPAP   . RBBB   . Renal insufficiency   . Right renal mass   . Spinal stenosis  of lumbar region     Family History  Problem Relation Age of Onset  . Heart disease Father     SOCIAL HISTORY: Social History   Socioeconomic History  . Marital status: Married    Spouse name: Not on file  . Number of children: Not on file  . Years of education: Not on file  . Highest education level: Not on file  Occupational History  . Not on file  Tobacco Use  . Smoking status: Former Smoker    Packs/day: 1.00    Years: 30.00    Pack years: 30.00    Types: Cigarettes  . Smokeless tobacco: Never Used  . Tobacco comment: quit ~ 1985  Vaping Use  . Vaping Use: Never used  Substance and Sexual Activity  . Alcohol use: Yes    Alcohol/week:  0.0 standard drinks    Comment: rare beer  . Drug use: No  . Sexual activity: Not on file  Other Topics Concern  . Not on file  Social History Narrative  . Not on file   Social Determinants of Health   Financial Resource Strain:   . Difficulty of Paying Living Expenses: Not on file  Food Insecurity:   . Worried About Charity fundraiser in the Last Year: Not on file  . Ran Out of Food in the Last Year: Not on file  Transportation Needs:   . Lack of Transportation (Medical): Not on file  . Lack of Transportation (Non-Medical): Not on file  Physical Activity:   . Days of Exercise per Week: Not on file  . Minutes of Exercise per Session: Not on file  Stress:   . Feeling of Stress : Not on file  Social Connections:   . Frequency of Communication with Friends and Family: Not on file  . Frequency of Social Gatherings with Friends and Family: Not on file  . Attends Religious Services: Not on file  . Active Member of Clubs or Organizations: Not on file  . Attends Archivist Meetings: Not on file  . Marital Status: Not on file  Intimate Partner Violence:   . Fear of Current or Ex-Partner: Not on file  . Emotionally Abused: Not on file  . Physically Abused: Not on file  . Sexually Abused: Not on file    Allergies  Allergen Reactions  . Nitrostat [Nitroglycerin] Other (See Comments)    Causes blood pressure to "bottom out"    Current Outpatient Medications  Medication Sig Dispense Refill  . acetaminophen (TYLENOL) 500 MG tablet Take 500-1,000 mg by mouth every 6 (six) hours as needed for mild pain, moderate pain or headache.     . allopurinol (ZYLOPRIM) 300 MG tablet Take 300 mg by mouth daily.    Marland Kitchen amLODipine (NORVASC) 5 MG tablet Take 2.5 mg by mouth daily.     Marland Kitchen atorvastatin (LIPITOR) 20 MG tablet TAKE 1 TABLET BY MOUTH DAILY (Patient taking differently: Take 20 mg by mouth daily. ) 90 tablet 2  . b complex vitamins tablet Take 1 tablet by mouth daily.    .  fenofibrate micronized (LOFIBRA) 134 MG capsule Take 134 mg by mouth daily before breakfast.    . furosemide (LASIX) 40 MG tablet Take 0.5 tablets (20 mg total) by mouth daily. 30 tablet   . metoprolol succinate (TOPROL-XL) 25 MG 24 hr tablet TAKE 1 TABLET BY MOUTH DAILY (Patient taking differently: Take 25 mg by mouth daily. ) 90 tablet 3  . PRILOSEC OTC  20 MG tablet Take 20 mg by mouth daily before breakfast.     . Rivaroxaban (XARELTO) 15 MG TABS tablet Take 1 tablet (15 mg total) by mouth daily after supper.    . Tamsulosin HCl (FLOMAX) 0.4 MG CAPS Take 1 capsule (0.4 mg total) by mouth daily. 30 capsule 0   No current facility-administered medications for this visit.    REVIEW OF SYSTEMS:  [X]  denotes positive finding, [ ]  denotes negative finding Cardiac  Comments:  Chest pain or chest pressure:    Shortness of breath upon exertion:    Short of breath when lying flat:    Irregular heart rhythm:        Vascular    Pain in calf, thigh, or hip brought on by ambulation:    Pain in feet at night that wakes you up from your sleep:     Blood clot in your veins:    Leg swelling:  x       Pulmonary    Oxygen at home:    Productive cough:     Wheezing:         Neurologic    Sudden weakness in arms or legs:     Sudden numbness in arms or legs:     Sudden onset of difficulty speaking or slurred speech:    Temporary loss of vision in one eye:     Problems with dizziness:         Gastrointestinal    Blood in stool:     Vomited blood:         Genitourinary    Burning when urinating:     Blood in urine:        Psychiatric    Major depression:         Hematologic    Bleeding problems:    Problems with blood clotting too easily:        Skin    Rashes or ulcers:        Constitutional    Fever or chills:     PHYSICAL EXAM:   Vitals:   12/10/19 1310  BP: (!) 152/74  Pulse: 66  Resp: 20  Temp: (!) 97.3 F (36.3 C)  SpO2: 99%  Weight: 186 lb (84.4 kg)  Height: 5\' 6"   (1.676 m)    GENERAL: The patient is a well-nourished male, in no acute distress. The vital signs are documented above. CARDIAC: There is a regular rate and rhythm.  VASCULAR: I do not detect carotid bruits. He has palpable radial pulses bilaterally. He has some mild lower extremity swelling. PULMONARY: There is good air exchange bilaterally without wheezing or rales. ABDOMEN: Soft and non-tender with normal pitched bowel sounds.  MUSCULOSKELETAL: There are no major deformities or cyanosis. NEUROLOGIC: No focal weakness or paresthesias are detected. SKIN: There are no ulcers or rashes noted. PSYCHIATRIC: The patient has a normal affect.  DATA:    UPPER EXTREMITY VEIN MAP: I have independently interpreted his upper extremity vein map.  On the right side the forearm cephalic vein is not visualized.  The upper arm cephalic vein is very small and not adequate.  The basilic vein is marginal in size.  On the left side the forearm cephalic vein is very small in size.  The upper arm cephalic vein does not appear adequate.  The basilic vein does not appear adequate.  UPPER EXTREMITY ARTERIAL DUPLEX: I have independently interpreted his upper extremity arterial duplex.  On the right side there  is a triphasic radial and ulnar signal.  The brachial artery measures 0.41 cm in maximum diameter.  On the left side there is a triphasic radial and ulnar signal.  The brachial artery measures 0.45 cm in diameter.  LABS: I reviewed the labs from 12/02/2019.  The GFR was 17.  This would suggest stage IV chronic kidney disease close to stage V.

## 2019-12-16 ENCOUNTER — Other Ambulatory Visit: Payer: Self-pay

## 2019-12-16 ENCOUNTER — Encounter (HOSPITAL_COMMUNITY)
Admission: RE | Admit: 2019-12-16 | Discharge: 2019-12-16 | Disposition: A | Discharge status: Home / Self Care | Payer: Medicare Other | Source: Ambulatory Visit | Attending: Nephrology | Admitting: Nephrology

## 2019-12-16 VITALS — BP 140/53 | HR 60 | Temp 97.4°F | Resp 20

## 2019-12-16 DIAGNOSIS — N185 Chronic kidney disease, stage 5: Secondary | ICD-10-CM | POA: Insufficient documentation

## 2019-12-16 DIAGNOSIS — D631 Anemia in chronic kidney disease: Secondary | ICD-10-CM | POA: Diagnosis not present

## 2019-12-16 LAB — POCT HEMOGLOBIN-HEMACUE: Hemoglobin: 8.3 g/dL — ABNORMAL LOW (ref 13.0–17.0)

## 2019-12-16 MED ORDER — EPOETIN ALFA-EPBX 10000 UNIT/ML IJ SOLN
20000.0000 [IU] | INTRAMUSCULAR | Status: DC
  Administered 2019-12-16: 20000 [IU] via SUBCUTANEOUS

## 2019-12-16 MED ORDER — EPOETIN ALFA-EPBX 10000 UNIT/ML IJ SOLN
INTRAMUSCULAR | Status: AC
  Filled 2019-12-16: qty 2

## 2019-12-17 DIAGNOSIS — N179 Acute kidney failure, unspecified: Secondary | ICD-10-CM | POA: Diagnosis not present

## 2019-12-17 DIAGNOSIS — N2581 Secondary hyperparathyroidism of renal origin: Secondary | ICD-10-CM | POA: Diagnosis not present

## 2019-12-17 DIAGNOSIS — N185 Chronic kidney disease, stage 5: Secondary | ICD-10-CM | POA: Diagnosis not present

## 2019-12-17 DIAGNOSIS — I120 Hypertensive chronic kidney disease with stage 5 chronic kidney disease or end stage renal disease: Secondary | ICD-10-CM | POA: Diagnosis not present

## 2019-12-17 DIAGNOSIS — D631 Anemia in chronic kidney disease: Secondary | ICD-10-CM | POA: Diagnosis not present

## 2019-12-30 ENCOUNTER — Other Ambulatory Visit: Payer: Self-pay

## 2019-12-30 ENCOUNTER — Encounter (HOSPITAL_COMMUNITY)
Admission: RE | Admit: 2019-12-30 | Discharge: 2019-12-30 | Disposition: A | Discharge status: Home / Self Care | Payer: Medicare Other | Source: Ambulatory Visit | Attending: Nephrology | Admitting: Nephrology

## 2019-12-30 VITALS — BP 133/59 | HR 61 | Temp 97.9°F | Resp 20

## 2019-12-30 DIAGNOSIS — D631 Anemia in chronic kidney disease: Secondary | ICD-10-CM

## 2019-12-30 DIAGNOSIS — N185 Chronic kidney disease, stage 5: Secondary | ICD-10-CM | POA: Diagnosis not present

## 2019-12-30 LAB — CBC WITH DIFFERENTIAL/PLATELET
Abs Immature Granulocytes: 0.03 10*3/uL (ref 0.00–0.07)
Basophils Absolute: 0 10*3/uL (ref 0.0–0.1)
Basophils Relative: 0 %
Eosinophils Absolute: 0.3 10*3/uL (ref 0.0–0.5)
Eosinophils Relative: 4 %
HCT: 27.9 % — ABNORMAL LOW (ref 39.0–52.0)
Hemoglobin: 7.8 g/dL — ABNORMAL LOW (ref 13.0–17.0)
Immature Granulocytes: 0 %
Lymphocytes Relative: 10 %
Lymphs Abs: 0.7 10*3/uL (ref 0.7–4.0)
MCH: 25 pg — ABNORMAL LOW (ref 26.0–34.0)
MCHC: 28 g/dL — ABNORMAL LOW (ref 30.0–36.0)
MCV: 89.4 fL (ref 80.0–100.0)
Monocytes Absolute: 0.3 10*3/uL (ref 0.1–1.0)
Monocytes Relative: 5 %
Neutro Abs: 5.5 10*3/uL (ref 1.7–7.7)
Neutrophils Relative %: 81 %
Platelets: 175 10*3/uL (ref 150–400)
RBC: 3.12 MIL/uL — ABNORMAL LOW (ref 4.22–5.81)
RDW: 19.9 % — ABNORMAL HIGH (ref 11.5–15.5)
WBC: 6.9 10*3/uL (ref 4.0–10.5)
nRBC: 0 % (ref 0.0–0.2)

## 2019-12-30 LAB — RENAL FUNCTION PANEL
Albumin: 3.4 g/dL — ABNORMAL LOW (ref 3.5–5.0)
Anion gap: 12 (ref 5–15)
BUN: 49 mg/dL — ABNORMAL HIGH (ref 8–23)
CO2: 17 mmol/L — ABNORMAL LOW (ref 22–32)
Calcium: 8.8 mg/dL — ABNORMAL LOW (ref 8.9–10.3)
Chloride: 113 mmol/L — ABNORMAL HIGH (ref 98–111)
Creatinine, Ser: 3.49 mg/dL — ABNORMAL HIGH (ref 0.61–1.24)
GFR, Estimated: 17 mL/min — ABNORMAL LOW (ref >60)
Glucose, Bld: 100 mg/dL — ABNORMAL HIGH (ref 70–99)
Phosphorus: 3.9 mg/dL (ref 2.5–4.6)
Potassium: 4 mmol/L (ref 3.5–5.1)
Sodium: 142 mmol/L (ref 135–145)

## 2019-12-30 LAB — POCT HEMOGLOBIN-HEMACUE: Hemoglobin: 8 g/dL — ABNORMAL LOW (ref 13.0–17.0)

## 2019-12-30 MED ORDER — EPOETIN ALFA-EPBX 10000 UNIT/ML IJ SOLN
INTRAMUSCULAR | Status: AC
  Administered 2019-12-30: 20000 [IU] via SUBCUTANEOUS
  Filled 2019-12-30: qty 2

## 2019-12-30 MED ORDER — EPOETIN ALFA-EPBX 10000 UNIT/ML IJ SOLN: 20000.0000 [IU] | INTRAMUSCULAR | Status: DC

## 2020-01-07 ENCOUNTER — Other Ambulatory Visit (HOSPITAL_COMMUNITY): Payer: Self-pay | Admitting: Urology

## 2020-01-07 ENCOUNTER — Other Ambulatory Visit: Payer: Self-pay | Admitting: Urology

## 2020-01-07 DIAGNOSIS — C641 Malignant neoplasm of right kidney, except renal pelvis: Secondary | ICD-10-CM

## 2020-01-08 ENCOUNTER — Ambulatory Visit (HOSPITAL_COMMUNITY): Payer: Medicare Other | Attending: Cardiology

## 2020-01-08 ENCOUNTER — Other Ambulatory Visit: Payer: Self-pay

## 2020-01-08 DIAGNOSIS — I441 Atrioventricular block, second degree: Secondary | ICD-10-CM

## 2020-01-08 DIAGNOSIS — R001 Bradycardia, unspecified: Secondary | ICD-10-CM

## 2020-01-08 DIAGNOSIS — I495 Sick sinus syndrome: Secondary | ICD-10-CM | POA: Diagnosis not present

## 2020-01-08 LAB — ECHOCARDIOGRAM COMPLETE
Area-P 1/2: 3.51 cm2
MV M vel: 5.38 m/s
MV Peak grad: 115.8 mmHg
S' Lateral: 4.4 cm

## 2020-01-13 ENCOUNTER — Encounter (HOSPITAL_COMMUNITY)
Admission: RE | Admit: 2020-01-13 | Discharge: 2020-01-13 | Disposition: A | Discharge status: Home / Self Care | Payer: Medicare Other | Source: Ambulatory Visit | Attending: Nephrology | Admitting: Nephrology

## 2020-01-13 ENCOUNTER — Other Ambulatory Visit: Payer: Self-pay

## 2020-01-13 VITALS — BP 127/54 | HR 65 | Temp 97.5°F | Resp 20

## 2020-01-13 DIAGNOSIS — D631 Anemia in chronic kidney disease: Secondary | ICD-10-CM | POA: Insufficient documentation

## 2020-01-13 DIAGNOSIS — N185 Chronic kidney disease, stage 5: Secondary | ICD-10-CM | POA: Diagnosis not present

## 2020-01-13 LAB — POCT HEMOGLOBIN-HEMACUE: Hemoglobin: 7.1 g/dL — ABNORMAL LOW (ref 13.0–17.0)

## 2020-01-13 MED ORDER — EPOETIN ALFA-EPBX 10000 UNIT/ML IJ SOLN
INTRAMUSCULAR | Status: AC
  Administered 2020-01-13: 20000 [IU] via SUBCUTANEOUS
  Filled 2020-01-13: qty 2

## 2020-01-13 MED ORDER — EPOETIN ALFA-EPBX 10000 UNIT/ML IJ SOLN: 20000.0000 [IU] | INTRAMUSCULAR | Status: DC

## 2020-01-14 ENCOUNTER — Other Ambulatory Visit (HOSPITAL_COMMUNITY): Payer: Self-pay | Admitting: *Deleted

## 2020-01-15 ENCOUNTER — Other Ambulatory Visit: Payer: Self-pay

## 2020-01-15 ENCOUNTER — Ambulatory Visit (HOSPITAL_COMMUNITY)
Admission: RE | Admit: 2020-01-15 | Discharge: 2020-01-15 | Disposition: A | Discharge status: Home / Self Care | Payer: Medicare Other | Source: Ambulatory Visit | Attending: Nephrology | Admitting: Nephrology

## 2020-01-15 DIAGNOSIS — N189 Chronic kidney disease, unspecified: Secondary | ICD-10-CM | POA: Insufficient documentation

## 2020-01-15 DIAGNOSIS — D631 Anemia in chronic kidney disease: Secondary | ICD-10-CM | POA: Diagnosis not present

## 2020-01-15 LAB — PREPARE RBC (CROSSMATCH)

## 2020-01-15 MED ORDER — SODIUM CHLORIDE 0.9% IV SOLUTION: Freq: Once | INTRAVENOUS | Status: DC

## 2020-01-15 MED ORDER — FUROSEMIDE 10 MG/ML IJ SOLN: 20.0000 mg | Freq: Once | INTRAMUSCULAR | Status: DC

## 2020-01-15 MED ORDER — ACETAMINOPHEN 325 MG PO TABS
ORAL_TABLET | ORAL | Status: AC
  Filled 2020-01-15: qty 2

## 2020-01-15 MED ORDER — DIPHENHYDRAMINE HCL 25 MG PO CAPS
ORAL_CAPSULE | ORAL | Status: AC
  Filled 2020-01-15: qty 1

## 2020-01-15 MED ORDER — ACETAMINOPHEN 325 MG PO TABS
650.0000 mg | ORAL_TABLET | Freq: Once | ORAL | Status: AC
  Administered 2020-01-15: 650 mg via ORAL

## 2020-01-15 MED ORDER — DIPHENHYDRAMINE HCL 25 MG PO CAPS
25.0000 mg | ORAL_CAPSULE | Freq: Once | ORAL | Status: AC
  Administered 2020-01-15: 25 mg via ORAL

## 2020-01-15 MED ORDER — FUROSEMIDE 10 MG/ML IJ SOLN
INTRAMUSCULAR | Status: AC
  Filled 2020-01-15: qty 2

## 2020-01-16 LAB — BPAM RBC
Blood Product Expiration Date: 202202102359
ISSUE DATE / TIME: 202201060941
Unit Type and Rh: 5100

## 2020-01-16 LAB — TYPE AND SCREEN
ABO/RH(D): O POS
Antibody Screen: NEGATIVE
Unit division: 0

## 2020-01-18 DIAGNOSIS — I499 Cardiac arrhythmia, unspecified: Secondary | ICD-10-CM | POA: Diagnosis not present

## 2020-01-18 DIAGNOSIS — Z743 Need for continuous supervision: Secondary | ICD-10-CM | POA: Diagnosis not present

## 2020-01-18 DIAGNOSIS — R0902 Hypoxemia: Secondary | ICD-10-CM | POA: Diagnosis not present

## 2020-01-18 DIAGNOSIS — R070 Pain in throat: Secondary | ICD-10-CM | POA: Diagnosis not present

## 2020-01-18 DIAGNOSIS — R509 Fever, unspecified: Secondary | ICD-10-CM | POA: Diagnosis not present

## 2020-01-19 ENCOUNTER — Other Ambulatory Visit: Payer: Self-pay | Admitting: Cardiovascular Disease

## 2020-01-20 ENCOUNTER — Emergency Department (HOSPITAL_COMMUNITY): Payer: Medicare Other

## 2020-01-20 ENCOUNTER — Inpatient Hospital Stay (HOSPITAL_COMMUNITY)
Admission: EM | Admit: 2020-01-20 | Discharge: 2020-01-26 | DRG: 377 | Disposition: A | Discharge status: Home Health | Payer: Medicare Other | Attending: Internal Medicine | Admitting: Internal Medicine

## 2020-01-20 ENCOUNTER — Other Ambulatory Visit: Payer: Self-pay

## 2020-01-20 DIAGNOSIS — M5030 Other cervical disc degeneration, unspecified cervical region: Secondary | ICD-10-CM | POA: Diagnosis present

## 2020-01-20 DIAGNOSIS — D509 Iron deficiency anemia, unspecified: Secondary | ICD-10-CM | POA: Diagnosis present

## 2020-01-20 DIAGNOSIS — I2510 Atherosclerotic heart disease of native coronary artery without angina pectoris: Secondary | ICD-10-CM | POA: Diagnosis not present

## 2020-01-20 DIAGNOSIS — I447 Left bundle-branch block, unspecified: Secondary | ICD-10-CM | POA: Diagnosis present

## 2020-01-20 DIAGNOSIS — I358 Other nonrheumatic aortic valve disorders: Secondary | ICD-10-CM | POA: Diagnosis present

## 2020-01-20 DIAGNOSIS — D649 Anemia, unspecified: Secondary | ICD-10-CM | POA: Diagnosis not present

## 2020-01-20 DIAGNOSIS — R0602 Shortness of breath: Secondary | ICD-10-CM | POA: Diagnosis present

## 2020-01-20 DIAGNOSIS — Z86718 Personal history of other venous thrombosis and embolism: Secondary | ICD-10-CM

## 2020-01-20 DIAGNOSIS — J440 Chronic obstructive pulmonary disease with acute lower respiratory infection: Secondary | ICD-10-CM | POA: Diagnosis not present

## 2020-01-20 DIAGNOSIS — E870 Hyperosmolality and hypernatremia: Secondary | ICD-10-CM | POA: Diagnosis not present

## 2020-01-20 DIAGNOSIS — U071 COVID-19: Secondary | ICD-10-CM | POA: Diagnosis present

## 2020-01-20 DIAGNOSIS — R918 Other nonspecific abnormal finding of lung field: Secondary | ICD-10-CM | POA: Diagnosis not present

## 2020-01-20 DIAGNOSIS — K219 Gastro-esophageal reflux disease without esophagitis: Secondary | ICD-10-CM | POA: Diagnosis present

## 2020-01-20 DIAGNOSIS — D631 Anemia in chronic kidney disease: Secondary | ICD-10-CM | POA: Diagnosis not present

## 2020-01-20 DIAGNOSIS — Z79899 Other long term (current) drug therapy: Secondary | ICD-10-CM

## 2020-01-20 DIAGNOSIS — I4510 Unspecified right bundle-branch block: Secondary | ICD-10-CM | POA: Diagnosis not present

## 2020-01-20 DIAGNOSIS — I5020 Unspecified systolic (congestive) heart failure: Secondary | ICD-10-CM | POA: Diagnosis present

## 2020-01-20 DIAGNOSIS — E8809 Other disorders of plasma-protein metabolism, not elsewhere classified: Secondary | ICD-10-CM | POA: Diagnosis present

## 2020-01-20 DIAGNOSIS — N179 Acute kidney failure, unspecified: Secondary | ICD-10-CM | POA: Diagnosis present

## 2020-01-20 DIAGNOSIS — I441 Atrioventricular block, second degree: Secondary | ICD-10-CM | POA: Diagnosis present

## 2020-01-20 DIAGNOSIS — N185 Chronic kidney disease, stage 5: Secondary | ICD-10-CM | POA: Diagnosis not present

## 2020-01-20 DIAGNOSIS — Z87891 Personal history of nicotine dependence: Secondary | ICD-10-CM

## 2020-01-20 DIAGNOSIS — M109 Gout, unspecified: Secondary | ICD-10-CM | POA: Diagnosis present

## 2020-01-20 DIAGNOSIS — K921 Melena: Secondary | ICD-10-CM | POA: Diagnosis not present

## 2020-01-20 DIAGNOSIS — E872 Acidosis: Secondary | ICD-10-CM | POA: Diagnosis not present

## 2020-01-20 DIAGNOSIS — E7800 Pure hypercholesterolemia, unspecified: Secondary | ICD-10-CM | POA: Diagnosis present

## 2020-01-20 DIAGNOSIS — I132 Hypertensive heart and chronic kidney disease with heart failure and with stage 5 chronic kidney disease, or end stage renal disease: Secondary | ICD-10-CM | POA: Diagnosis not present

## 2020-01-20 DIAGNOSIS — G4733 Obstructive sleep apnea (adult) (pediatric): Secondary | ICD-10-CM | POA: Diagnosis not present

## 2020-01-20 DIAGNOSIS — Z955 Presence of coronary angioplasty implant and graft: Secondary | ICD-10-CM

## 2020-01-20 DIAGNOSIS — N400 Enlarged prostate without lower urinary tract symptoms: Secondary | ICD-10-CM | POA: Diagnosis present

## 2020-01-20 DIAGNOSIS — N2889 Other specified disorders of kidney and ureter: Secondary | ICD-10-CM | POA: Diagnosis present

## 2020-01-20 DIAGNOSIS — E785 Hyperlipidemia, unspecified: Secondary | ICD-10-CM | POA: Diagnosis present

## 2020-01-20 DIAGNOSIS — R6889 Other general symptoms and signs: Secondary | ICD-10-CM | POA: Diagnosis not present

## 2020-01-20 DIAGNOSIS — J90 Pleural effusion, not elsewhere classified: Secondary | ICD-10-CM | POA: Diagnosis not present

## 2020-01-20 DIAGNOSIS — R404 Transient alteration of awareness: Secondary | ICD-10-CM | POA: Diagnosis not present

## 2020-01-20 DIAGNOSIS — I120 Hypertensive chronic kidney disease with stage 5 chronic kidney disease or end stage renal disease: Secondary | ICD-10-CM | POA: Diagnosis not present

## 2020-01-20 DIAGNOSIS — R509 Fever, unspecified: Secondary | ICD-10-CM | POA: Diagnosis not present

## 2020-01-20 DIAGNOSIS — I5022 Chronic systolic (congestive) heart failure: Secondary | ICD-10-CM

## 2020-01-20 DIAGNOSIS — R58 Hemorrhage, not elsewhere classified: Secondary | ICD-10-CM | POA: Diagnosis not present

## 2020-01-20 DIAGNOSIS — J1282 Pneumonia due to coronavirus disease 2019: Secondary | ICD-10-CM | POA: Diagnosis not present

## 2020-01-20 DIAGNOSIS — K922 Gastrointestinal hemorrhage, unspecified: Secondary | ICD-10-CM | POA: Diagnosis not present

## 2020-01-20 DIAGNOSIS — D62 Acute posthemorrhagic anemia: Secondary | ICD-10-CM | POA: Diagnosis not present

## 2020-01-20 DIAGNOSIS — Z9989 Dependence on other enabling machines and devices: Secondary | ICD-10-CM

## 2020-01-20 DIAGNOSIS — I5042 Chronic combined systolic (congestive) and diastolic (congestive) heart failure: Secondary | ICD-10-CM | POA: Diagnosis present

## 2020-01-20 DIAGNOSIS — Z888 Allergy status to other drugs, medicaments and biological substances status: Secondary | ICD-10-CM

## 2020-01-20 DIAGNOSIS — Z8249 Family history of ischemic heart disease and other diseases of the circulatory system: Secondary | ICD-10-CM

## 2020-01-20 DIAGNOSIS — Z951 Presence of aortocoronary bypass graft: Secondary | ICD-10-CM

## 2020-01-20 DIAGNOSIS — I10 Essential (primary) hypertension: Secondary | ICD-10-CM | POA: Diagnosis present

## 2020-01-20 DIAGNOSIS — Z905 Acquired absence of kidney: Secondary | ICD-10-CM

## 2020-01-20 DIAGNOSIS — Z85528 Personal history of other malignant neoplasm of kidney: Secondary | ICD-10-CM

## 2020-01-20 DIAGNOSIS — Z950 Presence of cardiac pacemaker: Secondary | ICD-10-CM

## 2020-01-20 DIAGNOSIS — I480 Paroxysmal atrial fibrillation: Secondary | ICD-10-CM | POA: Diagnosis not present

## 2020-01-20 DIAGNOSIS — Z743 Need for continuous supervision: Secondary | ICD-10-CM | POA: Diagnosis not present

## 2020-01-20 DIAGNOSIS — Z7901 Long term (current) use of anticoagulants: Secondary | ICD-10-CM

## 2020-01-20 LAB — COMPREHENSIVE METABOLIC PANEL
ALT: 17 U/L (ref 0–44)
AST: 36 U/L (ref 15–41)
Albumin: 3.1 g/dL — ABNORMAL LOW (ref 3.5–5.0)
Alkaline Phosphatase: 152 U/L — ABNORMAL HIGH (ref 38–126)
Anion gap: 15 (ref 5–15)
BUN: 52 mg/dL — ABNORMAL HIGH (ref 8–23)
CO2: 16 mmol/L — ABNORMAL LOW (ref 22–32)
Calcium: 8.3 mg/dL — ABNORMAL LOW (ref 8.9–10.3)
Chloride: 108 mmol/L (ref 98–111)
Creatinine, Ser: 4.37 mg/dL — ABNORMAL HIGH (ref 0.61–1.24)
GFR, Estimated: 13 mL/min — ABNORMAL LOW (ref >60)
Glucose, Bld: 109 mg/dL — ABNORMAL HIGH (ref 70–99)
Potassium: 3.8 mmol/L (ref 3.5–5.1)
Sodium: 139 mmol/L (ref 135–145)
Total Bilirubin: 0.6 mg/dL (ref 0.3–1.2)
Total Protein: 6.4 g/dL — ABNORMAL LOW (ref 6.5–8.1)

## 2020-01-20 LAB — CBC
HCT: 30.1 % — ABNORMAL LOW (ref 39.0–52.0)
Hemoglobin: 8.6 g/dL — ABNORMAL LOW (ref 13.0–17.0)
MCH: 24.9 pg — ABNORMAL LOW (ref 26.0–34.0)
MCHC: 28.6 g/dL — ABNORMAL LOW (ref 30.0–36.0)
MCV: 87.2 fL (ref 80.0–100.0)
Platelets: 153 10*3/uL (ref 150–400)
RBC: 3.45 MIL/uL — ABNORMAL LOW (ref 4.22–5.81)
RDW: 19.6 % — ABNORMAL HIGH (ref 11.5–15.5)
WBC: 7.6 10*3/uL (ref 4.0–10.5)
nRBC: 0 % (ref 0.0–0.2)

## 2020-01-20 LAB — TYPE AND SCREEN
ABO/RH(D): O POS
Antibody Screen: NEGATIVE

## 2020-01-20 LAB — SARS CORONAVIRUS 2 (TAT 6-24 HRS): SARS Coronavirus 2: POSITIVE — AB

## 2020-01-20 LAB — POC OCCULT BLOOD, ED: Fecal Occult Bld: POSITIVE — AB

## 2020-01-20 MED ORDER — ACETAMINOPHEN 650 MG RE SUPP: 650.0000 mg | Freq: Four times a day (QID) | RECTAL | Status: DC | PRN

## 2020-01-20 MED ORDER — TAMSULOSIN HCL 0.4 MG PO CAPS
0.4000 mg | ORAL_CAPSULE | Freq: Every day | ORAL | Status: DC
  Administered 2020-01-21 – 2020-01-26 (×6): 0.4 mg via ORAL
  Filled 2020-01-20 (×5): qty 1

## 2020-01-20 MED ORDER — ALBUTEROL SULFATE (2.5 MG/3ML) 0.083% IN NEBU: 2.5000 mg | INHALATION_SOLUTION | RESPIRATORY_TRACT | Status: DC | PRN

## 2020-01-20 MED ORDER — ACETAMINOPHEN 500 MG PO TABS
1000.0000 mg | ORAL_TABLET | Freq: Once | ORAL | Status: AC
  Administered 2020-01-20: 1000 mg via ORAL
  Filled 2020-01-20: qty 2

## 2020-01-20 MED ORDER — SODIUM CHLORIDE 0.9 % IV SOLN
75.0000 mL/h | INTRAVENOUS | Status: AC
  Administered 2020-01-20: 75 mL/h via INTRAVENOUS

## 2020-01-20 MED ORDER — ATORVASTATIN CALCIUM 10 MG PO TABS
20.0000 mg | ORAL_TABLET | Freq: Every day | ORAL | Status: DC
  Administered 2020-01-21 – 2020-01-26 (×6): 20 mg via ORAL
  Filled 2020-01-20 (×6): qty 2

## 2020-01-20 MED ORDER — ALBUTEROL SULFATE HFA 108 (90 BASE) MCG/ACT IN AERS: 2.0000 | INHALATION_SPRAY | Freq: Four times a day (QID) | RESPIRATORY_TRACT | Status: DC

## 2020-01-20 MED ORDER — PANTOPRAZOLE SODIUM 40 MG IV SOLR: 40.0000 mg | Freq: Two times a day (BID) | INTRAVENOUS | Status: DC

## 2020-01-20 MED ORDER — SODIUM CHLORIDE 0.9 % IV SOLN
200.0000 mg | Freq: Once | INTRAVENOUS | Status: AC
  Administered 2020-01-21: 200 mg via INTRAVENOUS
  Filled 2020-01-20: qty 40

## 2020-01-20 MED ORDER — HYDROCODONE-ACETAMINOPHEN 5-325 MG PO TABS: 1.0000 | ORAL_TABLET | ORAL | Status: DC | PRN

## 2020-01-20 MED ORDER — SODIUM CHLORIDE 0.9 % IV SOLN
8.0000 mg/h | INTRAVENOUS | Status: DC
  Administered 2020-01-20 – 2020-01-21 (×2): 8 mg/h via INTRAVENOUS
  Filled 2020-01-20 (×4): qty 80

## 2020-01-20 MED ORDER — ACETAMINOPHEN 325 MG PO TABS: 650.0000 mg | ORAL_TABLET | Freq: Four times a day (QID) | ORAL | Status: DC | PRN

## 2020-01-20 MED ORDER — SODIUM CHLORIDE 0.9 % IV SOLN
100.0000 mg | Freq: Every day | INTRAVENOUS | Status: DC
  Administered 2020-01-22: 100 mg via INTRAVENOUS
  Filled 2020-01-20: qty 20

## 2020-01-20 MED ORDER — SODIUM CHLORIDE 0.9 % IV SOLN
80.0000 mg | Freq: Once | INTRAVENOUS | Status: AC
  Administered 2020-01-20: 80 mg via INTRAVENOUS
  Filled 2020-01-20: qty 80

## 2020-01-20 NOTE — H&P (Signed)
Paul Price:[id number] DOB: Nov 01, 1940 DOA: 01/20/2020     PCP: [name], MD   Outpatient Specialists:  CARDS:   Dr. Claiborne Billings NEphrology:   Dr. Shary Decamp    Oncology  Dr. Irene Limbo GI  Dr. Therisa Doyne  [facility], ) Urology Dr.  Lindaann Pascal Surgery [place] Patient arrived to ER on 01/20/20 at 1748 Referred by Attending Davonna Belling, MD   Patient coming from: home Lives   With family    Chief Complaint:  Melena and lighheadeness  HPI: Paul Price is a 80 y.o. male with medical history chronic anemia, CKD, arthritis, atrial fibrillation, BPH, CAD status post CABG in 92, history of DVT 2015, GERD, gout, HLD, systolic CHF, HTN, obesity, OSA on CPAP, right renal mass, second-degree Mobitz block, A. fib   Presented with      significant of generalized weakness he has chronic anemia and on Friday had to have blood transfusion fluid.  Today he has had some black stools as well as dark red stool. He has been feeling lightheaded but there is no coffee-ground emesis no vomiting.  No associated fever today but have had fevers and cough in the past couple days. Reports runny nose and sore throat He has many grandchildren who comes to visit him  Last admission was in November 2021 for same found to have acute blood loss anemia superimposed of chronic he was transfused 2 units had EGD done by Dr. Score as well as capsule study showed mild gastritis duodenitis but otherwise unremarkable were not sure about source of anemia or blood loss   Reports last blood in BM was 1/10 he had had a bit of loose stools  Infectious risk factors:  Reports  Sore throat, URI symptoms,   Body aches, severe fatigue     Has   been vaccinated against COVID not boosted   Initial COVID TEST   POSITIVE,     Lab Results  Component Value Date   [place] (A) 01/20/2020   [place] NEGATIVE 11/14/2019   [place] NEGATIVE 05/17/2019   [place] NEGATIVE 03/27/2019     Regarding  pertinent Chronic problems:   Right renal mass status post nephrectomy  History of first-degree AV block status post pacemaker   Hyperlipidemia -  on statins Lipitor and fenofibrate Lipid Panel     Component Value Date/Time   CHOL 104 12/22/2016 0535   TRIG 91 12/22/2016 0535   HDL 29 (L) 12/22/2016 0535   CHOLHDL 3.6 12/22/2016 0535   VLDL 18 12/22/2016 0535   LDLCALC 57 12/22/2016 0535     HTN on Norvasc metoprolol    chronic CHF diastolic/systolic/ combined - last echo 2018 EF 40-45% with prior h/o grade 2 diastolic dysfunction  on Lasix  CAD  - no Aspirin, on  statin, betablocker,                  -  followed by cardiology                - last cardiac cath   last stent placed in 2012    COPD - not  followed by pulmonology   not  on baseline oxygen      OSA -on nocturnal  CPAP      A. Fib -  - CHA2DS2 vas score   8    current  on anticoagulation with  Xarelto ,           -  Rate control:  Currently controlled  with  Toprolol,    CKD stage IIIb- baseline Cr 3.5He was previously evaluated for PD and was determined to not be a candidate -He is scheduled to see vascular in early December to consider AV fistula placement in anticipation of future need for HD    Lab Results  Component Value Date   CREATININE 4.37 (H) 01/20/2020   CREATININE 3.49 (H) 12/30/2019   CREATININE 3.48 (H) 12/02/2019    BPH - on Flomax,    Gout on allopurinol   Chronic anemia - baseline hg Hemoglobin & Hematocrit  Recent Labs    12/30/19 0900 01/13/20 0846 01/20/20 1757  HGB 7.8* 7.1* 8.6*    History of iron deficiency anemia followed by [facility] status post EGD in 2018 and then again in 2021   While in ER:  Noted to be bradycardic down to 54 temperature 100.3 AKI hemoglobin actually up from base line   Hemoccult positive from below with black stools chest x-ray no acute      Hospitalist was called for admission for  Upper gi bleeding The following Work up has been ordered  so far:  Orders Placed This Encounter  Procedures  . SARS CORONAVIRUS 2 (TAT 6-24 HRS) Nasopharyngeal Nasopharyngeal Swab  . DG Chest Portable 1 View  . Comprehensive metabolic panel  . CBC  . Urinalysis, Routine w reflex microscopic  . Diet NPO time specified  . Orthostatic Vital signs  . Place X2 Large Bore IV's  . Initiate Carrier Fluid Protocol  . Consult to hospitalist  ALL PATIENTS BEING ADMITTED/HAVING PROCEDURES NEED COVID-19 SCREENING  . Airborne and Contact precautions  . POC occult blood, ED  . Type and screen [place]     Following Medications were ordered in ER: Medications  acetaminophen (TYLENOL) tablet 1,000 mg (1,000 mg Oral Given 01/20/20 1804)        Consult Orders  (From admission, onward)         Start     Ordered   01/20/20 2033  Consult to hospitalist  ALL PATIENTS BEING ADMITTED/HAVING PROCEDURES NEED COVID-19 SCREENING PG SENT BY DELORIS  Once       Comments: ALL PATIENTS BEING ADMITTED/HAVING PROCEDURES NEED COVID-19 SCREENING  Provider:  (Not yet assigned)  Question Answer Comment  Place call to: [facility]   Reason for Consult Admit      01/20/20 2032          Significant initial  Findings: Abnormal Labs Reviewed  SARS CORONAVIRUS 2 (TAT 6-24 HRS) - Abnormal; Notable for the following components:      Result Value   SARS Coronavirus 2 POSITIVE (*)    All other components within normal limits  COMPREHENSIVE METABOLIC PANEL - Abnormal; Notable for the following components:   CO2 16 (*)    Glucose, Bld 109 (*)    BUN 52 (*)    Creatinine, Ser 4.37 (*)    Calcium 8.3 (*)    Total Protein 6.4 (*)    Albumin 3.1 (*)    Alkaline Phosphatase 152 (*)    GFR, Estimated 13 (*)    All other components within normal limits  CBC - Abnormal; Notable for the following components:   RBC 3.45 (*)    Hemoglobin 8.6 (*)    HCT 30.1 (*)    MCH 24.9 (*)    MCHC 28.6 (*)    RDW 19.6 (*)    All other components within  normal limits  POC OCCULT BLOOD, ED -  Abnormal; Notable for the following components:   Fecal Occult Bld POSITIVE (*)    All other components within normal limits    Otherwise labs showing:    Recent Labs  Lab 01/20/20 1757  NA 139  K 3.8  CO2 16*  GLUCOSE 109*  BUN 52*  CREATININE 4.37*  CALCIUM 8.3*    Cr    Up from baseline see below Lab Results  Component Value Date   CREATININE 4.37 (H) 01/20/2020   CREATININE 3.49 (H) 12/30/2019   CREATININE 3.48 (H) 12/02/2019    Recent Labs  Lab 01/20/20 1757  AST 36  ALT 17  ALKPHOS 152*  BILITOT 0.6  PROT 6.4*  ALBUMIN 3.1*   Lab Results  Component Value Date   CALCIUM 8.3 (L) 01/20/2020   PHOS 3.9 12/30/2019     WBC      Component Value Date/Time   WBC 7.6 01/20/2020 1757   LYMPHSABS 0.7 12/30/2019 0900   MONOABS 0.3 12/30/2019 0900   EOSABS 0.3 12/30/2019 0900   BASOSABS 0.0 12/30/2019 0900    Plt: Lab Results  Component Value Date   PLT 153 01/20/2020     Procalcitonin  Ordered   COVID-19 Labs  No results for input(s): DDIMER, FERRITIN, LDH, CRP in the last 72 hours.  Lab Results  Component Value Date   SARSCOV2NAA POSITIVE (A) 01/20/2020   SARSCOV2NAA NEGATIVE 11/14/2019   [place] NEGATIVE 05/17/2019   [place] NEGATIVE 03/27/2019   HG/HCT   Stable,     Component Value Date/Time   HGB 8.6 (L) 01/20/2020 1757   HGB 7.7 (L) 10/20/2019 0924   HCT 30.1 (L) 01/20/2020 1757   HCT 39.9 10/01/2018 1130   MCV 87.2 01/20/2020 1757     Troponin  ordered     ECG: Ordered Personally reviewed by me showing: HR : 63 Rhythm:  LBBB, Paced vs Junctional QTC 488    UA  ordered   Ordered    CXR - cardiomegaly with trace right pleural effusion    ED Triage Vitals  Enc Vitals Group     BP 01/20/20 1752 (!) 142/62     Pulse Rate 01/20/20 1752 80     Resp 01/20/20 1752 18     Temp 01/20/20 1752 100.3 F (37.9 C)     Temp Source 01/20/20 1752 Oral     SpO2 01/20/20 1752 96 %     Weight  01/20/20 1752 181 lb (82.1 kg)     Height 01/20/20 1752 5\' 6"  (1.676 m)     Head Circumference --      Peak Flow --      Pain Score 01/20/20 1754 0     Pain Loc --      Pain Edu? --      Excl. in Between? --   TMAX(24)@       Latest  Blood pressure (!) 131/51, pulse 65, temperature 100.3 F (37.9 C), temperature source Oral, resp. rate (!) 21, height 5\' 6"  (1.676 m), weight 82.1 kg, SpO2 98 %.    Review of Systems:    Pertinent positives include:   Fevers, chills, fatigue, , melena, blood in stool,   Constitutional:  No weight loss, night sweats weight loss  HEENT:  No headaches, Difficulty swallowing,Tooth/dental problems,Sore throat,  No sneezing, itching, ear ache, nasal congestion, post nasal drip,  Cardio-vascular:  No chest pain, Orthopnea, PND, anasarca, dizziness, palpitations.no Bilateral lower extremity swelling  GI:  No heartburn, indigestion, abdominal pain, nausea,  vomiting, diarrhea, change in bowel habits, loss of appetitehematemesis Resp:  no shortness of breath at rest. No dyspnea on exertion, No excess mucus, no productive cough, No non-productive cough, No coughing up of blood.No change in color of mucus.No wheezing. Skin:  no rash or lesions. No jaundice GU:  no dysuria, change in color of urine, no urgency or frequency. No straining to urinate.  No flank pain.  Musculoskeletal:  No joint pain or no joint swelling. No decreased range of motion. No back pain.  Psych:  No change in mood or affect. No depression or anxiety. No memory loss.  Neuro: no localizing neurological complaints, no tingling, no weakness, no double vision, no gait abnormality, no slurred speech, no confusion  All systems reviewed and apart from [place] all are negative  Past Medical History:   Past Medical History:  Diagnosis Date  . Arthritis    "shoulders" (09/07/2016)  . Atrial fibrillation ([place])   . BPH (benign prostatic hyperplasia)   . Chronic kidney disease (CKD), stage III  (moderate) (HCC)   . Coronary artery disease   . DDD (degenerative disc disease), cervical   . DVT (deep venous thrombosis) ([place]) 12/2013   LLE  . GERD (gastroesophageal reflux disease)   . Gout   . High cholesterol   . History of blood transfusion 09/06/2016   2 u PRBC  . History of scarlet fever 1940s  . History of stress test 06/2011   No significant ischemia, this is a low risk scan. Clinical correlation recommended Abnormal myocardial perfusion study.  Marland Kitchen Hx of echocardiogram 05/2009   EF 40-45%, he did have mild annular calcification with mild-to-moderate MR and mild TR as well as aortic valve sclerosis. Estimated RV systolic pressure was 21 mm.  . Hypertension   . Iron deficiency anemia   . Kidney failure   . 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 Neck pain    "not chronic" (09/07/2016)  . Obesity   . OSA on CPAP   . RBBB   . Renal insufficiency   . Right renal mass   . Spinal stenosis of lumbar region       Past Surgical History:  Procedure Laterality Date  . APPENDECTOMY    . CARDIAC CATHETERIZATION  2009   he was found to have a atretic LIMA graft to his LAD and had a patent vein graft supplying his diagonal vessel. At that time, he had 70% narrowing in his LAD beyond a diagonal vessel which was initially treated medically.  . COLONOSCOPY WITH PROPOFOL Left 09/09/2016   Procedure: COLONOSCOPY WITH PROPOFOL;  Surgeon: Arta Silence, MD;  Location: [facility];  Service: Endoscopy;  Laterality: Left;  . CORONARY ANGIOPLASTY WITH STENT PLACEMENT  April 2012   LAD DES  . CORONARY ARTERY BYPASS GRAFT  1992   with LIMA to the LAD and diagonal.  . CYSTOSCOPY N/A 03/31/2019   Procedure: CYSTOSCOPY FLEXIBLE;  Surgeon: Lucas Mallow, MD;  Location: [facility];  Service: Urology;  Laterality: N/A;  . CYSTOSCOPY WITH URETHRAL DILATATION N/A 05/21/2019   Procedure: CYSTOSCOPY WITH URETHRAL BALLOON DILATATION;  Surgeon: Lucas Mallow, MD;  Location: [facility];  Service:  Urology;  Laterality: N/A;  . ESOPHAGOGASTRODUODENOSCOPY N/A 11/16/2019   Procedure: ESOPHAGOGASTRODUODENOSCOPY (EGD);  Surgeon: Wilford Corner, MD;  Location: [facility];  Service: Endoscopy;  Laterality: N/A;  . ESOPHAGOGASTRODUODENOSCOPY (EGD) WITH PROPOFOL Left 09/08/2016   Procedure: ESOPHAGOGASTRODUODENOSCOPY (EGD) WITH PROPOFOL;  Surgeon: Ronnette Juniper, MD;  Location: [facility];  Service: Gastroenterology;  Laterality: Left;  . GIVENS CAPSULE STUDY N/A 11/16/2019   Procedure: GIVENS CAPSULE STUDY;  Surgeon: Wilford Corner, MD;  Location: [facility];  Service: Endoscopy;  Laterality: N/A;  . INGUINAL HERNIA REPAIR     "don't remeimber which side"  . LAPAROSCOPIC NEPHRECTOMY Right 03/31/2019   Procedure: RIGHT  HAND ASSIST LAPAROSCOPIC NEPHRECTOMY;  Surgeon: Lucas Mallow, MD;  Location: [facility];  Service: Urology;  Laterality: Right;  . PACEMAKER IMPLANT N/A 12/25/2016   Procedure: PACEMAKER IMPLANT;  Surgeon: Deboraha Sprang, MD;  Location: [facility];  Service: Cardiovascular;  Laterality: N/A;  . TONSILLECTOMY      Social History:  Ambulatory   independently       reports that he has quit smoking. His smoking use included cigarettes. He has a 30.00 pack-year smoking history. He has never used smokeless tobacco. He reports current alcohol use. He reports that he does not use drugs.   Family History:   Family History  Problem Relation Age of Onset  . Heart disease Father     Allergies: Allergies  Allergen Reactions  . Nitrostat [Nitroglycerin] Other (See Comments)    Causes blood pressure to "bottom out"     Prior to Admission medications   Medication Sig Start Date End Date Taking? Authorizing Provider  acetaminophen (TYLENOL) 500 MG tablet Take 500-1,000 mg by mouth every 6 (six) hours as needed for mild pain, moderate pain or headache.     [provider]  allopurinol (ZYLOPRIM) 300 MG tablet Take 300 mg by mouth daily.    [provider]  amLODipine (NORVASC) 5 MG tablet Take 2.5 mg by mouth daily.     [provider]  atorvastatin (LIPITOR) 20 MG tablet TAKE 1 TABLET BY MOUTH DAILY Patient taking differently: Take 20 mg by mouth daily.  09/08/19   Troy Sine, MD  b complex vitamins tablet Take 1 tablet by mouth daily.    [provider]  fenofibrate micronized (LOFIBRA) 134 MG capsule Take 134 mg by mouth daily before breakfast.    [provider]  furosemide (LASIX) 40 MG tablet Take 0.5 tablets (20 mg total) by mouth daily. 11/17/19   Geradine Girt, DO  metoprolol succinate (TOPROL-XL) 25 MG 24 hr tablet TAKE 1 TABLET BY MOUTH DAILY 01/19/20   Troy Sine, MD  PRILOSEC OTC 20 MG tablet Take 20 mg by mouth daily before breakfast.     [provider]  Rivaroxaban (XARELTO) 15 MG TABS tablet Take 1 tablet (15 mg total) by mouth daily after supper. 11/19/19   Geradine Girt, DO  Tamsulosin HCl (FLOMAX) 0.4 MG CAPS Take 1 capsule (0.4 mg total) by mouth daily. 07/20/11   Chauncy Passy, MD   Physical Exam: Vitals with BMI 01/20/2020 01/20/2020 01/20/2020  Height - - -  Weight - - -  BMI - - -  Systolic 426 834 196  Diastolic 51 60 60  Pulse 65 58 67     1. General:  in No  Acute distress   Chronically ill -appearing 2. Psychological: Alert and  Oriented 3. Head/ENT:   Dry Mucous Membranes                          Head Non traumatic, neck supple  Poor Dentition 4. SKIN:  decreased Skin turgor,  Skin clean Dry and intact no rash 5. Heart: Regular rate and rhythm no Murmur, no Rub or gallop 6. Lungs: no wheezes or crackles   7. Abdomen: Soft,  non-tender, Non distended  bowel sounds present 8. Lower extremities: no clubbing, cyanosis, no  edema 9. Neurologically Grossly intact, moving all 4 extremities equally  10. MSK: Normal range of motion   All other LABS:     Recent Labs  Lab 01/20/20 1757  WBC 7.6  HGB 8.6*  HCT 30.1*  MCV  87.2  PLT 153     Recent Labs  Lab 01/20/20 1757  NA 139  K 3.8  CL 108  CO2 16*  GLUCOSE 109*  BUN 52*  CREATININE 4.37*  CALCIUM 8.3*     Recent Labs  Lab 01/20/20 1757  AST 36  ALT 17  ALKPHOS 152*  BILITOT 0.6  PROT 6.4*  ALBUMIN 3.1*       Cultures:    Component Value Date/Time   SDES URINE, RANDOM 12/21/2016 0721   SPECREQUEST NONE 12/21/2016 0721   CULT NO GROWTH 12/21/2016 0721   REPTSTATUS 12/22/2016 FINAL 12/21/2016 0721     Radiological Exams on Admission: DG Chest Portable 1 View  Result Date: 01/20/2020 CLINICAL DATA:  Fever cough EXAM: PORTABLE CHEST 1 VIEW COMPARISON:  04/03/2019 FINDINGS: Post sternotomy changes. Left-sided pacing device as before. Cardiomegaly. Trace right-sided pleural effusion. No focal consolidation or pneumothorax. IMPRESSION: Cardiomegaly with trace right-sided pleural effusion. Electronically Signed   By: Donavan Foil M.D.   On: 01/20/2020 19:50    Chart has been reviewed    Assessment/Plan   80 y.o. male with medical history chronic anemia, CKD, arthritis, atrial fibrillation, BPH, CAD status post CABG in 92, history of DVT 2015, GERD, gout, HLD, systolic CHF, HTN, obesity, OSA on CPAP, right renal mass, second-degree Mobitz block, A. fib   Admitted for upper gi bleed and incidental COVID  Present on Admission: . Upper GI bleeding -  - [place] score BUN >18.2   Hg <42A  systolic  melena    CHF   >1 Justifies admission and aggressive management      Modifying risk factors include:     anticoagulation,  Prior hx of GI bleed       -    AIMS 65 =   Mental status change, TOTAL of   1   reassuring     - Sent epic msg to gastroenterology ( [facility],  ) they will see patient in a.m. appreciate their consult   - serial CBC.    - Monitor for any recurrence,  evidence of hemodynamic instability or significant blood loss  - Transfuse as needed for hemoglobin below 7 or evidence of life-threatening bleeding  -  Establish at least 2 PIV and fluid resuscitate   - clear liquids for tonight keep nothing by mouth post midnight,   -  administer Protonix  drip   . Systolic congestive heart failure ([place]) - - currently appears to be slightly on the dry side, hold home diuretics for tonight and restart when appears euvolemic, carefuly follow fluid status and Cr  . Symptomatic anemia - currently stable transfuse as needed obtain type and screen   . Stage 5 chronic kidney disease (HCC) -  -chronic avoid nephrotoxic medications such as NSAIDs, Vanco Zosyn combo,  avoid hypotension, continue to follow renal function  . Second degree Mobitz I AV block - sp  pace maker Sp pacemaker   . Renal mass - sp ressection followed by Dr. Marin Olp  . PAF (paroxysmal atrial fibrillation) ([place]) - hold xarelto  Hold toprol to allow permissive hypertension for tonight  . Hyperlipidemia with target LDL less than 70 -continue Lipitor  . HTN (hypertension) allow permissive hypertension for recent GI bleed    COVID infection -incidental finding patient is vaccinated not boosted So far mild URI symptoms no evidence of hypoxia Start remdesivir Hold off on steroids no infiltrates or pulmonary complaints No indication for immunomodulators Obtain inflammatory markers Pronate as needed Oxygen as needed Supportive measures Avoid over aggressive fluid resuscitation Airborne precautions  . CAD - CABG '92, LAD DES 4/12, low risk Myoview 6/13 -  - chronic, not on aspirin  Cont  Statin  resume beta blocker when stable   OSA PATIENT IS POSITIVE COVID hold off on CPAP for tonight oxygen as needed  Other plan as per orders.  DVT prophylaxis:  SCD    Code Status:    Code Status: Prior FULL CODE   as per patient  I had personally discussed CODE STATUS with patient     Family Communication:   Family not at  Bedside    Disposition Plan:       To home once workup is complete and patient is stable   Following barriers for  discharge:                            Electrolytes corrected                               Anemia stable                                                          Will need to be able to tolerate PO                                                      Will need consultants to evaluate patient prior to discharge                        Would benefit from PT/OT eval prior to DC  Ordered                                        Consults called: notified Howie Ill through epic  Admission status:  ED Disposition    ED Disposition Condition [place]: [place] [[id number]]  Level of Care: Telemetry Medical [104]  May admit patient to [facility] or [facility] if equivalent level of care is available:: No  Covid Evaluation: Symptomatic Person Under Investigation (PUI)  Diagnosis: Upper GI bleed [[id number]]  Admitting Physician: Toy Baker [[id number]]  Attending Physician: Toy Baker [[id number]]  Estimated length of stay: past midnight tomorrow  Certification:: I certify this patient will need inpatient services for at least 2  midnights          inpatient     I Expect 2 midnight stay secondary to severity of patient's current illness need for inpatient interventions justified by the following:     Severe lab/radiological/exam abnormalities including:    GI bleed, COVid positive and extensive comorbidities including:  CHF   CAD    CKD .   Marland Kitchen Chronic anticoagulation  That are currently affecting medical management.   I expect  patient to be hospitalized for 2 midnights requiring inpatient medical care.  Patient is at high risk for adverse outcome (such as loss of life or disability) if not treated.  Indication for inpatient stay as follows:    inability to maintain oral hydration    Need for operative/procedural  intervention    Need for   V fluids, IV PPI    Level of care    Tele indefinitely please discontinue once patient no  longer qualifies COVID-19 Labs    Lab Results  Component Value Date   [place] (A) 01/20/2020     Precautions: admitted as  covid positive Airborne and Contact precautions    PPE: Used by the provider:   P100  eye Goggles,  Gloves  gown     Larenz Frasier 01/21/2020, 2:27 AM    [facility]     after 2 AM please page floor coverage PA If 7AM-7PM, please contact the day team taking care of the patient using [email or web address]   Patient was evaluated in the context of the global COVID-19 pandemic, which necessitated consideration that the patient might be at risk for infection with the SARS-CoV-2 virus that causes COVID-19. Institutional protocols and algorithms that pertain to the evaluation of patients at risk for COVID-19 are in a state of rapid change based on information released by regulatory bodies including the CDC and federal and state organizations. These policies and algorithms were followed during the patient's care.

## 2020-01-20 NOTE — ED Provider Notes (Addendum)
[facility] EMERGENCY DEPARTMENT Provider Note   CSN: [id number] Arrival date & time: 01/20/20  1748     History No chief complaint on file.   Paul Price is a 80 y.o. male.  HPI Patient presents feeling bad in general weakness.  States that on Friday he had a blood transfusion for recurrent anemia.  States they have not found a clear cause for her.  States today he developed some black stool and also some dark red stool today.  States he feels lightheaded.  No nausea or vomiting.  No fevers now but states for the last few days he has been having fevers and a little bit of a cough.  Transfusion on Thursday and on Friday started to feel bad.  Has had his COVID-vaccine.  No COVID test since fever started.  States he feels lightheaded when he stands.  Feels as if he could need another transfusion.  States his baseline creatinine is around 3-1/2.    Past Medical History:  Diagnosis Date  . Arthritis    "shoulders" (09/07/2016)  . Atrial fibrillation ([place])   . BPH (benign prostatic hyperplasia)   . Chronic kidney disease (CKD), stage III (moderate) (HCC)   . Coronary artery disease   . DDD (degenerative disc disease), cervical   . DVT (deep venous thrombosis) ([place]) 12/2013   LLE  . GERD (gastroesophageal reflux disease)   . Gout   . High cholesterol   . History of blood transfusion 09/06/2016   2 u PRBC  . History of scarlet fever 1940s  . History of stress test 06/2011   No significant ischemia, this is a low risk scan. Clinical correlation recommended Abnormal myocardial perfusion study.  Marland Kitchen Hx of echocardiogram 05/2009   EF 40-45%, he did have mild annular calcification with mild-to-moderate MR and mild TR as well as aortic valve sclerosis. Estimated RV systolic pressure was 21 mm.  . Hypertension   . Iron deficiency anemia   . Kidney failure   . 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 Neck pain    "not chronic" (09/07/2016)  . Obesity   . OSA on  CPAP   . RBBB   . Renal insufficiency   . Right renal mass   . Spinal stenosis of lumbar region     Patient Active Problem List   Diagnosis Date Noted  . Melena 11/16/2019  . Symptomatic anemia 11/14/2019  . Stage 5 chronic kidney disease ([place]) 11/14/2019  . Anemia in chronic kidney disease 08/18/2019  . Renal mass 03/31/2019  . Acute on chronic systolic CHF (congestive heart failure) ([place])   . 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 Symptomatic bradycardia 12/21/2016  . Systolic congestive heart failure ([place]) 12/20/2016  . Normocytic anemia 09/06/2016  . Upper GI bleeding 09/06/2016  . Acute blood loss anemia   . New onset a-fib ([place]) 08/08/2016  . Chronic diastolic (congestive) heart failure ([place]) 08/08/2016  . Hypotension 08/08/2016  . A-fib ([place]) 08/08/2016  . Primary gout   . 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 Syncope 12/02/2013  . Left leg DVT ([place]) 12/02/2013  . Fever 12/02/2013  . AKI (acute kidney injury) ([place]) 12/02/2013  . Renal insufficiency 07/02/2013  . PAF (paroxysmal atrial fibrillation) ([place]) 01/04/2013  . Sinus pause, 4.3 seconds.  Recorded by [facility]. 12/02/2012  . NSVT (nonsustained ventricular tachycardia) ([place]) 12/02/2012  . Dyspnea on exertion 11/20/2012  . CAD - CABG '92, LAD DES 4/12, low risk Myoview  6/13 08/20/2012  . HTN (hypertension) 08/20/2012  . Hyperlipidemia with target LDL less than 70 08/20/2012  . Obesity (BMI 30-39.9) 08/20/2012  . OSA on CPAP 08/20/2012  . RBBB 08/20/2012    Past Surgical History:  Procedure Laterality Date  . APPENDECTOMY    . CARDIAC CATHETERIZATION  2009   he was found to have a atretic LIMA graft to his LAD and had a patent vein graft supplying his diagonal vessel. At that time, he had 70% narrowing in his LAD beyond a diagonal vessel which was initially treated medically.  . COLONOSCOPY WITH PROPOFOL Left 09/09/2016   Procedure: COLONOSCOPY WITH PROPOFOL;  Surgeon: Arta Silence, MD;  Location: [facility];   Service: Endoscopy;  Laterality: Left;  . CORONARY ANGIOPLASTY WITH STENT PLACEMENT  April 2012   LAD DES  . CORONARY ARTERY BYPASS GRAFT  1992   with LIMA to the LAD and diagonal.  . CYSTOSCOPY N/A 03/31/2019   Procedure: CYSTOSCOPY FLEXIBLE;  Surgeon: Lucas Mallow, MD;  Location: [facility];  Service: Urology;  Laterality: N/A;  . CYSTOSCOPY WITH URETHRAL DILATATION N/A 05/21/2019   Procedure: CYSTOSCOPY WITH URETHRAL BALLOON DILATATION;  Surgeon: Lucas Mallow, MD;  Location: [facility];  Service: Urology;  Laterality: N/A;  . ESOPHAGOGASTRODUODENOSCOPY N/A 11/16/2019   Procedure: ESOPHAGOGASTRODUODENOSCOPY (EGD);  Surgeon: Wilford Corner, MD;  Location: [facility];  Service: Endoscopy;  Laterality: N/A;  . ESOPHAGOGASTRODUODENOSCOPY (EGD) WITH PROPOFOL Left 09/08/2016   Procedure: ESOPHAGOGASTRODUODENOSCOPY (EGD) WITH PROPOFOL;  Surgeon: Ronnette Juniper, MD;  Location: [facility];  Service: Gastroenterology;  Laterality: Left;  . GIVENS CAPSULE STUDY N/A 11/16/2019   Procedure: GIVENS CAPSULE STUDY;  Surgeon: Wilford Corner, MD;  [facility];  Service: Endoscopy;  Laterality: N/A;  . INGUINAL HERNIA REPAIR     "don't remeimber which side"  . LAPAROSCOPIC NEPHRECTOMY Right 03/31/2019   Procedure: RIGHT  HAND ASSIST LAPAROSCOPIC NEPHRECTOMY;  Surgeon: Lucas Mallow, MD;  Location: [facility];  Service: Urology;  Laterality: Right;  . PACEMAKER IMPLANT N/A 12/25/2016   Procedure: PACEMAKER IMPLANT;  Surgeon: Deboraha Sprang, MD;  Location: [facility];  Service: Cardiovascular;  Laterality: N/A;  . TONSILLECTOMY         Family History  Problem Relation Age of Onset  . Heart disease Father     Social History   Tobacco Use  . Smoking status: Former Smoker    Packs/day: 1.00    Years: 30.00    Pack years: 30.00    Types: Cigarettes  . Smokeless tobacco: Never Used  . Tobacco comment: quit ~ 1985  Vaping Use  . Vaping Use: Never used  Substance Use Topics   . Alcohol use: Yes    Alcohol/week: 0.0 standard drinks    Comment: rare beer  . Drug use: No    Home Medications Prior to Admission medications   Medication Sig Start Date End Date Taking? Authorizing Provider  acetaminophen (TYLENOL) 500 MG tablet Take 500-1,000 mg by mouth every 6 (six) hours as needed for mild pain, moderate pain or headache.     [provider]  allopurinol (ZYLOPRIM) 300 MG tablet Take 300 mg by mouth daily.    [provider]  amLODipine (NORVASC) 5 MG tablet Take 2.5 mg by mouth daily.     [provider]  atorvastatin (LIPITOR) 20 MG tablet TAKE 1 TABLET BY MOUTH DAILY Patient taking differently: Take 20 mg by mouth daily.  09/08/19   Shelva Majestic  A, MD  b complex vitamins tablet Take 1 tablet by mouth daily.    [provider]  fenofibrate micronized (LOFIBRA) 134 MG capsule Take 134 mg by mouth daily before breakfast.    [provider]  furosemide (LASIX) 40 MG tablet Take 0.5 tablets (20 mg total) by mouth daily. 11/17/19   Geradine Girt, DO  metoprolol succinate (TOPROL-XL) 25 MG 24 hr tablet TAKE 1 TABLET BY MOUTH DAILY 01/19/20   Troy Sine, MD  PRILOSEC OTC 20 MG tablet Take 20 mg by mouth daily before breakfast.     [provider]  Rivaroxaban (XARELTO) 15 MG TABS tablet Take 1 tablet (15 mg total) by mouth daily after supper. 11/19/19   Geradine Girt, DO  Tamsulosin HCl (FLOMAX) 0.4 MG CAPS Take 1 capsule (0.4 mg total) by mouth daily. 07/20/11   Chauncy Passy, MD    Allergies    Nitrostat [nitroglycerin]  Review of Systems   Review of Systems  Constitutional: Positive for fatigue and fever.  HENT: Negative for congestion.   Respiratory: Positive for shortness of breath.   Gastrointestinal: Positive for blood in stool and diarrhea. Negative for abdominal pain.  Genitourinary: Negative for flank pain.  Musculoskeletal: Negative for back pain.  Skin: Negative for rash.  Neurological:  Negative for weakness.  Psychiatric/Behavioral: Negative for confusion.    Physical Exam Updated Vital Signs BP 140/64   Pulse (!) 54   Temp 100.3 F (37.9 C) (Oral)   Resp (!) 23   Ht 5\' 6"  (1.676 m)   Wt 82.1 kg   SpO2 97%   BMI 29.21 kg/m   Physical Exam Vitals and nursing note reviewed.  HENT:     Head: Normocephalic.  Eyes:     Pupils: Pupils are equal, round, and reactive to light.  Cardiovascular:     Rate and Rhythm: Regular rhythm.  Pulmonary:     Breath sounds: No wheezing or rhonchi.  Abdominal:     Tenderness: There is no abdominal tenderness.  Musculoskeletal:        General: No tenderness.     Cervical back: Neck supple.  Skin:    General: Skin is warm.     Capillary Refill: Capillary refill takes less than 2 seconds.  Neurological:     Mental Status: He is alert.     ED Results / Procedures / Treatments   Labs (all labs ordered are listed, but only abnormal results are displayed) Labs Reviewed  COMPREHENSIVE METABOLIC PANEL - Abnormal; Notable for the following components:      Result Value   CO2 16 (*)    Glucose, Bld 109 (*)    BUN 52 (*)    Creatinine, Ser 4.37 (*)    Calcium 8.3 (*)    Total Protein 6.4 (*)    Albumin 3.1 (*)    Alkaline Phosphatase 152 (*)    GFR, Estimated 13 (*)    All other components within normal limits  CBC - Abnormal; Notable for the following components:   RBC 3.45 (*)    Hemoglobin 8.6 (*)    HCT 30.1 (*)    MCH 24.9 (*)    MCHC 28.6 (*)    RDW 19.6 (*)    All other components within normal limits  POC OCCULT BLOOD, ED - Abnormal; Notable for the following components:   Fecal Occult Bld POSITIVE (*)    All other components within normal limits  SARS CORONAVIRUS 2 (TAT 6-24 HRS)  URINALYSIS, ROUTINE W REFLEX MICROSCOPIC  TYPE AND SCREEN    EKG None  Radiology DG Chest Portable 1 View  Result Date: 01/20/2020 CLINICAL DATA:  Fever cough EXAM: PORTABLE CHEST 1 VIEW COMPARISON:  04/03/2019  FINDINGS: Post sternotomy changes. Left-sided pacing device as before. Cardiomegaly. Trace right-sided pleural effusion. No focal consolidation or pneumothorax. IMPRESSION: Cardiomegaly with trace right-sided pleural effusion. Electronically Signed   By: Donavan Foil M.D.   On: 01/20/2020 19:50    Procedures Procedures (including critical care time)  Medications Ordered in ED Medications  acetaminophen (TYLENOL) tablet 1,000 mg (1,000 mg Oral Given 01/20/20 1804)    ED Course  I have reviewed the triage vital signs and the nursing notes.  Pertinent labs & imaging results that were available during my care of the patient were reviewed by me and considered in my medical decision making (see chart for details).    MDM Rules/Calculators/A&P                          Patient presents with fatigue generalized weakness black stools.  History of GI bleeds and history of recurrent anemias.  Per patient they do not quite know what caused but has had some bleeding in the past.  She has used on Thursday.  Then developed some fevers.  Chest x-ray overall reassuring but COVID test still pending.  No fevers for the last day or 2.  Hemoglobin appears to be near his baseline, however is guaiac positive with black stool on rectal exam.  He is on anticoagulation.  With recent transfusions and GI bleed I feels the patient benefit from mission to the hospital.  Also creatinine is gone from 3.5 up to 4.4.  Will admit to hospitalist. After patient been admission his COVID test came back positive. Final Clinical Impression(s) / ED Diagnoses Final diagnoses:  Gastrointestinal hemorrhage, unspecified gastrointestinal hemorrhage type  AKI (acute kidney injury) [facility])    Rx / DC Orders ED Discharge Orders    None       Davonna Belling, MD 01/20/20 2031    Davonna Belling, MD 01/20/20 2318

## 2020-01-20 NOTE — ED Triage Notes (Signed)
Pt from home via EMS, reports generalized weakness and low grade fever over the last three days. Today noticed black stool and diarrhea then dark red blood in stool. Pt on Xarelto. Endorses dizziness when he stands. Denies n/v or abdominal pain. Blood transfusion last week.

## 2020-01-21 DIAGNOSIS — K922 Gastrointestinal hemorrhage, unspecified: Secondary | ICD-10-CM | POA: Diagnosis not present

## 2020-01-21 DIAGNOSIS — N179 Acute kidney failure, unspecified: Secondary | ICD-10-CM | POA: Diagnosis not present

## 2020-01-21 DIAGNOSIS — G4733 Obstructive sleep apnea (adult) (pediatric): Secondary | ICD-10-CM | POA: Diagnosis not present

## 2020-01-21 DIAGNOSIS — U071 COVID-19: Secondary | ICD-10-CM | POA: Diagnosis not present

## 2020-01-21 LAB — BRAIN NATRIURETIC PEPTIDE: B Natriuretic Peptide: 881.8 pg/mL — ABNORMAL HIGH (ref 0.0–100.0)

## 2020-01-21 LAB — CBC WITH DIFFERENTIAL/PLATELET
Abs Immature Granulocytes: 0.04 10*3/uL (ref 0.00–0.07)
Basophils Absolute: 0 10*3/uL (ref 0.0–0.1)
Basophils Relative: 0 %
Eosinophils Absolute: 0 10*3/uL (ref 0.0–0.5)
Eosinophils Relative: 0 %
HCT: 28.1 % — ABNORMAL LOW (ref 39.0–52.0)
Hemoglobin: 8.6 g/dL — ABNORMAL LOW (ref 13.0–17.0)
Immature Granulocytes: 1 %
Lymphocytes Relative: 13 %
Lymphs Abs: 0.9 10*3/uL (ref 0.7–4.0)
MCH: 25.9 pg — ABNORMAL LOW (ref 26.0–34.0)
MCHC: 30.6 g/dL (ref 30.0–36.0)
MCV: 84.6 fL (ref 80.0–100.0)
Monocytes Absolute: 0.4 10*3/uL (ref 0.1–1.0)
Monocytes Relative: 6 %
Neutro Abs: 5.8 10*3/uL (ref 1.7–7.7)
Neutrophils Relative %: 80 %
Platelets: 141 10*3/uL — ABNORMAL LOW (ref 150–400)
RBC: 3.32 MIL/uL — ABNORMAL LOW (ref 4.22–5.81)
RDW: 19.4 % — ABNORMAL HIGH (ref 11.5–15.5)
WBC: 7.2 10*3/uL (ref 4.0–10.5)
nRBC: 0.3 % — ABNORMAL HIGH (ref 0.0–0.2)

## 2020-01-21 LAB — COMPREHENSIVE METABOLIC PANEL
ALT: 18 U/L (ref 0–44)
AST: 36 U/L (ref 15–41)
Albumin: 2.8 g/dL — ABNORMAL LOW (ref 3.5–5.0)
Alkaline Phosphatase: 141 U/L — ABNORMAL HIGH (ref 38–126)
Anion gap: 14 (ref 5–15)
BUN: 51 mg/dL — ABNORMAL HIGH (ref 8–23)
CO2: 16 mmol/L — ABNORMAL LOW (ref 22–32)
Calcium: 8.4 mg/dL — ABNORMAL LOW (ref 8.9–10.3)
Chloride: 111 mmol/L (ref 98–111)
Creatinine, Ser: 3.95 mg/dL — ABNORMAL HIGH (ref 0.61–1.24)
GFR, Estimated: 15 mL/min — ABNORMAL LOW (ref >60)
Glucose, Bld: 117 mg/dL — ABNORMAL HIGH (ref 70–99)
Potassium: 3.9 mmol/L (ref 3.5–5.1)
Sodium: 141 mmol/L (ref 135–145)
Total Bilirubin: 0.5 mg/dL (ref 0.3–1.2)
Total Protein: 6.2 g/dL — ABNORMAL LOW (ref 6.5–8.1)

## 2020-01-21 LAB — PHOSPHORUS: Phosphorus: 4.3 mg/dL (ref 2.5–4.6)

## 2020-01-21 LAB — MAGNESIUM: Magnesium: 2.1 mg/dL (ref 1.7–2.4)

## 2020-01-21 LAB — FERRITIN: Ferritin: 139 ng/mL (ref 24–336)

## 2020-01-21 LAB — C-REACTIVE PROTEIN: CRP: 12.7 mg/dL — ABNORMAL HIGH (ref <1.0)

## 2020-01-21 LAB — PROCALCITONIN: Procalcitonin: 1.01 ng/mL

## 2020-01-21 LAB — D-DIMER, QUANTITATIVE: D-Dimer, Quant: 1.91 ug/mL-FEU — ABNORMAL HIGH (ref 0.00–0.50)

## 2020-01-21 LAB — TSH: TSH: 2.159 u[IU]/mL (ref 0.350–4.500)

## 2020-01-21 LAB — FIBRINOGEN: Fibrinogen: 596 mg/dL — ABNORMAL HIGH (ref 210–475)

## 2020-01-21 LAB — LACTIC ACID, PLASMA: Lactic Acid, Venous: 1.1 mmol/L (ref 0.5–1.9)

## 2020-01-21 LAB — LACTATE DEHYDROGENASE: LDH: 196 U/L — ABNORMAL HIGH (ref 98–192)

## 2020-01-21 MED ORDER — BUDESONIDE 180 MCG/ACT IN AEPB: 2.0000 | INHALATION_SPRAY | Freq: Two times a day (BID) | RESPIRATORY_TRACT | Status: DC

## 2020-01-21 MED ORDER — SODIUM BICARBONATE 650 MG PO TABS
650.0000 mg | ORAL_TABLET | Freq: Two times a day (BID) | ORAL | Status: DC
Start: 2020-01-21 — End: 2020-01-23
  Administered 2020-01-21 – 2020-01-23 (×5): 650 mg via ORAL
  Filled 2020-01-21 (×7): qty 1

## 2020-01-21 MED ORDER — AMLODIPINE BESYLATE 2.5 MG PO TABS
2.5000 mg | ORAL_TABLET | Freq: Every day | ORAL | Status: DC
Start: 2020-01-21 — End: 2020-01-26
  Administered 2020-01-21 – 2020-01-26 (×6): 2.5 mg via ORAL
  Filled 2020-01-21 (×6): qty 1

## 2020-01-21 MED ORDER — ALBUTEROL SULFATE HFA 108 (90 BASE) MCG/ACT IN AERS
2.0000 | INHALATION_SPRAY | RESPIRATORY_TRACT | Status: DC | PRN
  Filled 2020-01-21: qty 6.7

## 2020-01-21 MED ORDER — METOPROLOL SUCCINATE ER 25 MG PO TB24
25.0000 mg | ORAL_TABLET | Freq: Every day | ORAL | Status: DC
  Administered 2020-01-21 – 2020-01-26 (×6): 25 mg via ORAL
  Filled 2020-01-21 (×6): qty 1

## 2020-01-21 NOTE — ED Notes (Signed)
Pt cleaned and had diaper changed

## 2020-01-21 NOTE — Progress Notes (Signed)
PROGRESS NOTE                                                                                                                                                                                                             Patient Demographics:    Paul Price, is a 80 y.o. male, DOB - 03/11/1940, YBW:[id number]  Outpatient Primary MD for the patient is [name], MD   Admit date - 01/20/2020   LOS - 1  No chief complaint on file.      Brief Narrative: Patient is a 80 y.o. male with PMHx of PAF on Xarelto, secondary AV block-s/p PPM placement, chronic systolic heart failure, CAD s/p CABG, stage V CKD-prior history of GI bleeding-presenting with 4-day history of fever, and diarrhea, and black stools.  Found to have upper GI bleeding-and COVID-19 infection.  See below for further details.   COVID-19 vaccinated status: Vaccinated-but not boosted.  Significant Events: 1/11>> Admit to [facility] for UGI bleeding and COVID-19 infection.  Significant studies: 1/11>>Chest x-ray: No focal consolidation/pneumothorax  COVID-19 medications: Remdesivir: 1/11 x 3 days  Antibiotics: None  Microbiology data: 1/12>>Blood culture: Pending  Procedures: None  Consults: None  DVT prophylaxis: SCDs Start: 01/20/20 2203    Subjective:    Paul Price today remains on room air-no further melanotic stools this morning.  Lying comfortably in bed-not short of breath.   Assessment  & Plan :   Upper GI bleeding: Has had prior episodes in the past-recent EGD/capsule study without any obvious abnormalities.  Evaluated by GI with recommendations for supportive care-PPI infusion x48 hours-and to pursue endoscopic evaluation if develops hemodynamically significant GI bleeding.  Anticoagulation remains on hold for now.  Follow CBC-and monitor clinical course.  COVID-19 infection: Some cough and low-grade fever-but does not have  pneumonia-symptoms ongoing for 4 days.  Remains on room air.  Monoclonal antibody not available for inpatient use due to national shortage-we will plan on a 3-day course of IV Remdesivir-given significant risk factors for severe disease.  However after speaking with the patient's son Jeff-family is very concerned about "side effects"-and "organs shutting down"-I have tried to reassure him-informed him of recent study in NEJM that showed significant decrease in severe disease if patient received Remdesivir early in the course of illness--however patient's son [name] will reach out to his other siblings-discuss with patient  and see if they still want to continue Remdesivir.  Son aware-that if family/patient refuses Remdesivir-apart from observation-we will not have any other agents to use.  Fever: afebrile this am O2 requirements:  SpO2: 96 %   COVID-19 Labs: Recent Labs    01/21/20 0000 01/21/20 0600  DDIMER 1.91*  --   FERRITIN  --  139  LDH  --  196*  CRP  --  12.7*       Component Value Date/Time   BNP 735.6 (H) 12/20/2016 1731    No results for input(s): PROCALCITON in the last 168 hours.  Lab Results  Component Value Date   SARSCOV2NAA POSITIVE (A) 01/20/2020   SARSCOV2NAA NEGATIVE 11/14/2019   [place] NEGATIVE 05/17/2019   [place] NEGATIVE 03/27/2019     Normocytic anemia: Secondary to chronic kidney disease-required 1 unit of PRBC on 1/6 as an outpatient.  Hemoglobin relatively stable-no evidence of significant blood loss at this point.  Follow closely.  AKI on CKD stage V: AKI hemodynamically mediated-creatinine trending down-follow periodically-continue supportive care-avoid nephrotoxic agents.  If renal function worsens significantly-we will consult nephrology.  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  PAF: Paced rhythm-resume metoprolol-hold Xarelto due to GI  bleeding.  Chronic systolic heart failure (EF 40-45% by echo on 01/08/2020): Compensated-resume diuretics over the next few days once renal function improves further.  History of second-degree AV block-s/p PPM placement  CAD s/p CABG-92-s/p DES/12: No anginal symptoms  HTN: BP uncontrolled-resume amlodipine and metoprolol-follow  COPD: Stable-continue as needed inhalers  OSA: On CPAP  BPH: On Flomax  ABG:    Component Value Date/Time   TCO2 18 (L) 09/06/2016 1418    Vent Settings: N/A    Condition - Stable  Family Communication  :  Son-[phone] updated over the phone 1/12  Code Status :  Full Code  Diet :  Diet Order            Diet clear liquid Room service appropriate? Yes; Fluid consistency: Thin  Diet effective now                  Disposition Plan  :   Status is: Inpatient  Remains inpatient appropriate because:Inpatient level of care appropriate due to severity of illness   Dispo: The patient is from: Home              Anticipated d/c is to: Home              Anticipated d/c date is: 3 days              Patient currently is not medically stable to d/c.    Barriers to discharge: UGI bleeding-on IV PPI/complete 3 days of IV Remdesivir  Antimicorbials  :    Anti-infectives (From admission, onward)   Start     Dose/Rate Route Frequency Ordered Stop   01/21/20 1000  remdesivir 100 mg in sodium chloride 0.9 % 100 mL IVPB       "Followed by" Linked Group Details   100 mg 200 mL/hr over 30 Minutes Intravenous Daily 01/20/20 2339 01/23/20 0959   01/20/20 2345  remdesivir 200 mg in sodium chloride 0.9% 250 mL IVPB       "Followed by" Linked Group Details   200 mg 580 mL/hr over 30 Minutes Intravenous Once 01/20/20 2339 01/21/20 0230      Inpatient Medications  Scheduled Meds: . amLODipine  2.5 mg  Oral Daily  . atorvastatin  20 mg Oral Daily  . metoprolol succinate  25 mg Oral Daily  . [START ON 01/24/2020] pantoprazole  40 mg Intravenous  Q12H  . tamsulosin  0.4 mg Oral Daily   Continuous Infusions: . pantoprozole (PROTONIX) infusion 8 mg/hr (01/21/20 1030)  . remdesivir 100 mg in NS 100 mL     PRN Meds:.acetaminophen **OR** acetaminophen, albuterol, HYDROcodone-acetaminophen   Time Spent in minutes  35    See all Orders from today for further details   Oren Binet M.D on 01/21/2020 at 11:07 AM  To page go to [email or web address] - use universal password  [facility] -  Office  [phone]    Objective:   Vitals:   01/21/20 0730 01/21/20 0745 01/21/20 0930 01/21/20 1045  BP: 134/65 (!) 142/66 (!) 152/63 (!) 157/64  Pulse: 63 70 73 80  Resp: (!) 21 18 (!) 24 (!) 25  Temp:      TempSrc:      SpO2: 98% 99% 94% 96%  Weight:      Height:        Wt Readings from Last 3 Encounters:  01/20/20 82.1 kg  12/10/19 84.4 kg  11/14/19 82.1 kg     Intake/Output Summary (Last 24 hours) at 01/21/2020 1107 Last data filed at 01/21/2020 0230 Gross per 24 hour  Intake 352.79 ml  Output --  Net 352.79 ml     Physical Exam Gen Exam:Alert awake-not in any distress HEENT:atraumatic, normocephalic Chest: B/L clear to auscultation anteriorly CVS:S1S2 regular Abdomen:soft non tender, non distended Extremities:no edema Neurology: Non focal Skin: no rash   Data Review:    CBC Recent Labs  Lab 01/20/20 1757 01/21/20 0600  WBC 7.6 7.2  HGB 8.6* 8.6*  HCT 30.1* 28.1*  PLT 153 141*  MCV 87.2 84.6  MCH 24.9* 25.9*  MCHC 28.6* 30.6  RDW 19.6* 19.4*  LYMPHSABS  --  0.9  MONOABS  --  0.4  EOSABS  --  0.0  BASOSABS  --  0.0    Chemistries  Recent Labs  Lab 01/20/20 1757 01/21/20 0600  NA 139 141  K 3.8 3.9  CL 108 111  CO2 16* 16*  GLUCOSE 109* 117*  BUN 52* 51*  CREATININE 4.37* 3.95*  CALCIUM 8.3* 8.4*  MG  --  2.1  AST 36 36  ALT 17 18  ALKPHOS 152* 141*  BILITOT 0.6 0.5    ------------------------------------------------------------------------------------------------------------------ No results for input(s): CHOL, HDL, LDLCALC, TRIG, CHOLHDL, LDLDIRECT in the last 72 hours.  No results found for: HGBA1C ------------------------------------------------------------------------------------------------------------------ Recent Labs    01/21/20 0600  TSH 2.159   ------------------------------------------------------------------------------------------------------------------ Recent Labs    01/21/20 0600  FERRITIN 139    Coagulation profile No results for input(s): INR, PROTIME in the last 168 hours.  Recent Labs    01/21/20 0000  DDIMER 1.91*    Cardiac Enzymes No results for input(s): CKMB, TROPONINI, MYOGLOBIN in the last 168 hours.  Invalid input(s): CK ------------------------------------------------------------------------------------------------------------------    Component Value Date/Time   BNP 735.6 (H) 12/20/2016 1731    Micro Results Recent Results (from the past 240 hour(s))  SARS CORONAVIRUS 2 (TAT 6-24 HRS) Nasopharyngeal Nasopharyngeal Swab     Status: Abnormal   Collection Time: 01/20/20  6:32 PM   Specimen: Nasopharyngeal Swab  Result Value Ref Range Status   SARS Coronavirus 2 POSITIVE (A) NEGATIVE Final    Comment: (NOTE) SARS-CoV-2 target nucleic acids are DETECTED.  The SARS-CoV-2 RNA is generally  detectable in upper and lower respiratory specimens during the acute phase of infection. Positive results are indicative of the presence of SARS-CoV-2 RNA. Clinical correlation with patient history and other diagnostic information is  necessary to determine patient infection status. Positive results do not rule out bacterial infection or co-infection with other viruses.  The expected result is Negative.  Fact Sheet for Patients: SugarRoll.be  Fact Sheet for Healthcare  Providers: https://www.woods-mathews.com/  This test is not yet approved or cleared by the [place] FDA and  has been authorized for detection and/or diagnosis of SARS-CoV-2 by FDA under an Emergency Use Authorization (EUA). This EUA will remain  in effect (meaning this test can be used) for the duration of the COVID-19 declaration under Section 564(b)(1) of the Act, 21 U. S.C. section 360bbb-3(b)(1), unless the authorization is terminated or revoked sooner.   Performed at [facility], [place] [address], [place], [place] [address]     Radiology Reports DG Chest Portable 1 View  Result Date: 01/20/2020 CLINICAL DATA:  Fever cough EXAM: PORTABLE CHEST 1 VIEW COMPARISON:  04/03/2019 FINDINGS: Post sternotomy changes. Left-sided pacing device as before. Cardiomegaly. Trace right-sided pleural effusion. No focal consolidation or pneumothorax. IMPRESSION: Cardiomegaly with trace right-sided pleural effusion. Electronically Signed   By: Donavan Foil M.D.   On: 01/20/2020 19:50   ECHOCARDIOGRAM COMPLETE  Result Date: 01/08/2020    ECHOCARDIOGRAM REPORT   Patient Name:   MARCUS SCHWANDT Date of Exam: 01/08/2020 Medical Rec #:  [id number]      Height:       66.0 in Accession #:    [id number]     Weight:       186.0 lb Date of Birth:  [birth year]       BSA:          1.939 m Patient Age:    37 years       BP:           152/74 mmHg Patient Gender: M              HR:           60 bpm. 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 CAD, Prior CABG, Abnormal ECG and Pacemaker,                 Arrythmias:Atrial Fibrillation and RBBB,                 Signs/Symptoms:Shortness of Breath and Chest Pain; Risk                 Factors:Hypertension,  Dyslipidemia, Family History of Coronary                 Artery Disease, Former Smoker and Sleep Apnea.  Sonographer:    Deliah Boston RDCS Referring Phys: Spr[place]  1. Left ventricular ejection fraction, by estimation, is 40 to 45%. The left ventricle has mildly decreased function. Left ventricular endocardial border not optimally defined to evaluate regional wall motion. Left ventricular diastolic function could not be evaluated. Elevated left ventricular end-diastolic pressure.  2. Right ventricular systolic function is normal. The right ventricular size is normal. There is mildly elevated pulmonary artery systolic pressure. The estimated right ventricular systolic pressure is 93.8 mmHg.  3. Left atrial size was mildly dilated.  4. The mitral valve is normal in structure. Mild mitral valve regurgitation. No evidence of mitral stenosis.  5. The aortic valve is normal in structure. Aortic valve regurgitation is not visualized. No aortic stenosis is present.  6. Aortic dilatation noted. There is mild dilatation of the aortic root, measuring 39 mm.  7. The inferior vena cava is normal in size with <50% respiratory variability, suggesting right atrial pressure of 8 mmHg.  8. Compared to study of 2018, PASP has decreased from 61 to 68mmHg. FINDINGS  Left Ventricle: Left ventricular ejection fraction, by estimation, is 40 to 45%. The left ventricle has mildly decreased function. Left ventricular endocardial border not optimally defined to evaluate regional wall motion. The left ventricular internal cavity size was normal in size. There is no left ventricular hypertrophy. Abnormal (paradoxical) septal motion, consistent with RV pacemaker. Left ventricular diastolic function could not be evaluated due to paced rhythm. Left ventricular diastolic function could not be evaluated. Elevated left ventricular end-diastolic pressure. Right Ventricle: The right ventricular size is normal. No increase in  right ventricular wall thickness. Right ventricular systolic function is normal. There is mildly elevated pulmonary artery systolic pressure. The tricuspid regurgitant velocity is 2.88  m/s, and with an assumed right atrial pressure of 8 mmHg, the estimated right ventricular systolic pressure is 18.2 mmHg. Left Atrium: Left atrial size was mildly dilated. Right Atrium: Right atrial size was normal in size. Pericardium: There is no evidence of pericardial effusion. Mitral Valve: The mitral valve is normal in structure. Mild mitral annular calcification. Mild mitral valve regurgitation. No evidence of mitral valve stenosis. Tricuspid Valve: The tricuspid valve is normal in structure. Tricuspid valve regurgitation is mild . No evidence of tricuspid stenosis. Aortic Valve: The aortic valve is normal in structure. Aortic valve regurgitation is not visualized. No aortic stenosis is present. Pulmonic Valve: The pulmonic valve was normal in structure. Pulmonic valve regurgitation is trivial. No evidence of pulmonic stenosis. Aorta: Aortic dilatation noted. There is mild dilatation of the aortic root, measuring 39 mm. Venous: The inferior vena cava is normal in size with less than 50% respiratory variability, suggesting right atrial pressure of 8 mmHg. IAS/Shunts: No atrial level shunt detected by color flow Doppler. Additional Comments: A pacer wire is visualized.  LEFT VENTRICLE PLAX 2D LVIDd:         5.20 cm  Diastology LVIDs:         4.40 cm  LV e' medial:    4.68 cm/s LV PW:         1.10 cm  LV E/e' medial:  21.3 LV IVS:        0.80 cm  LV e' lateral:   6.92 cm/s LVOT diam:     2.20 cm  LV E/e' lateral: 14.4 LV SV:         67 LV SV Index:   34 LVOT Area:     3.80 cm  RIGHT VENTRICLE TAPSE (M-mode): 1.6 cm LEFT ATRIUM             Index       RIGHT ATRIUM           Index LA diam:        4.50 cm 2.32 cm/m  RA Area:  25.80 cm LA Vol (A2C):   68.9 ml 35.53 ml/m RA Volume:   82.70 ml  42.65 ml/m LA Vol (A4C):   64.4  ml 33.21 ml/m LA Biplane Vol: 72.6 ml 37.44 ml/m  AORTIC VALVE LVOT Vmax:   79.20 cm/s LVOT Vmean:  50.500 cm/s LVOT VTI:    0.175 m  AORTA Ao Root diam: 3.90 cm Ao Asc diam:  3.40 cm MITRAL VALVE               TRICUSPID VALVE MV Area (PHT)  cm         TR Peak grad:   33.2 mmHg MV Decel Time: 216 msec    TR Vmax:        288.00 cm/s MR Peak grad: 115.8 mmHg MR Mean grad: 71.0 mmHg    SHUNTS MR Vmax:      538.00 cm/s  Systemic VTI:  0.18 m MR Vmean:     395.0 cm/s   Systemic Diam: 2.20 cm MV E velocity: 99.50 cm/s MV A velocity: 63.40 cm/s MV E/A ratio:  1.57 Fransico Him MD Electronically signed by Fransico Him MD Signature Date/Time: 01/08/2020/1:34:37 PM    Final

## 2020-01-21 NOTE — Consult Note (Signed)
Referring Provider: Dr. Sloan Leiter Primary Care Physician:  [name], MD Primary Gastroenterologist:  Dr. Michail Sermon  Reason for Consultation:  Melena  HPI: Paul Price is a 80 y.o. male with multiple medical problems including being on Xarelto for Afib. COVID Positive. Reports URI symptoms and denies chest pain or shortness of breath. He has a history of iron deficiency anemia and undergoes periodic iron infusions. S/P 1 U PRBCs last week for Hgb 7.1 and it is currently 8.6 today. Reports onset of black stools yesterday and denies black stools last week. Developed profuse nonbloody diarrhea the day after his blood transfusion that has continued through the weekend and this week. Denies abdominal pain/N/V/hematochezia/dysphagia/heartburn. Denies dizziness or lightheadedness. No appetite for the past 4 days stating that he has not eaten anything during those 4 days. He was last seen by me as a televisit in Sept 2020 for a Hgb 9.5 and it improved to 12.2 in Oct 2020. Normal EGD in 2018. Colonoscopy in 09/2016 showed sigmoid diverticulosis and internal and external hemorrhoids. No melena since presentation to the ER. Resting in bed on room air in no acute distress. Son in room.  Past Medical History:  Diagnosis Date  . Arthritis    "shoulders" (09/07/2016)  . Atrial fibrillation ([place])   . BPH (benign prostatic hyperplasia)   . Chronic kidney disease (CKD), stage III (moderate) (HCC)   . Coronary artery disease   . DDD (degenerative disc disease), cervical   . DVT (deep venous thrombosis) ([place]) 12/2013   LLE  . GERD (gastroesophageal reflux disease)   . Gout   . High cholesterol   . History of blood transfusion 09/06/2016   2 u PRBC  . History of scarlet fever 1940s  . History of stress test 06/2011   No significant ischemia, this is a low risk scan. Clinical correlation recommended Abnormal myocardial perfusion study.  Marland Kitchen Hx of echocardiogram 05/2009   EF 40-45%, he did have mild annular  calcification with mild-to-moderate MR and mild TR as well as aortic valve sclerosis. Estimated RV systolic pressure was 21 mm.  . Hypertension   . Iron deficiency anemia   . Kidney failure   . 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 Neck pain    "not chronic" (09/07/2016)  . Obesity   . OSA on CPAP   . RBBB   . Renal insufficiency   . Right renal mass   . Spinal stenosis of lumbar region     Past Surgical History:  Procedure Laterality Date  . APPENDECTOMY    . CARDIAC CATHETERIZATION  2009   he was found to have a atretic LIMA graft to his LAD and had a patent vein graft supplying his diagonal vessel. At that time, he had 70% narrowing in his LAD beyond a diagonal vessel which was initially treated medically.  . COLONOSCOPY WITH PROPOFOL Left 09/09/2016   Procedure: COLONOSCOPY WITH PROPOFOL;  Surgeon: Arta Silence, MD;  Location: [facility];  Service: Endoscopy;  Laterality: Left;  . CORONARY ANGIOPLASTY WITH STENT PLACEMENT  April 2012   LAD DES  . CORONARY ARTERY BYPASS GRAFT  1992   with LIMA to the LAD and diagonal.  . CYSTOSCOPY N/A 03/31/2019   Procedure: CYSTOSCOPY FLEXIBLE;  Surgeon: Lucas Mallow, MD;  Location: [facility];  Service: Urology;  Laterality: N/A;  . CYSTOSCOPY WITH URETHRAL DILATATION N/A 05/21/2019   Procedure: CYSTOSCOPY WITH URETHRAL BALLOON DILATATION;  Surgeon: Lucas Mallow, MD;  Location:  [facility];  Service: Urology;  Laterality: N/A;  . ESOPHAGOGASTRODUODENOSCOPY N/A 11/16/2019   Procedure: ESOPHAGOGASTRODUODENOSCOPY (EGD);  Surgeon: Wilford Corner, MD;  Location: [facility];  Service: Endoscopy;  Laterality: N/A;  . ESOPHAGOGASTRODUODENOSCOPY (EGD) WITH PROPOFOL Left 09/08/2016   Procedure: ESOPHAGOGASTRODUODENOSCOPY (EGD) WITH PROPOFOL;  Surgeon: Ronnette Juniper, MD;  Location: [facility];  Service: Gastroenterology;  Laterality: Left;  . GIVENS CAPSULE STUDY N/A 11/16/2019   Procedure: GIVENS CAPSULE STUDY;  Surgeon: Wilford Corner,  MD;  Location: [facility];  Service: Endoscopy;  Laterality: N/A;  . INGUINAL HERNIA REPAIR     "don't remeimber which side"  . LAPAROSCOPIC NEPHRECTOMY Right 03/31/2019   Procedure: RIGHT  HAND ASSIST LAPAROSCOPIC NEPHRECTOMY;  Surgeon: Lucas Mallow, MD;  Location: [facility];  Service: Urology;  Laterality: Right;  . PACEMAKER IMPLANT N/A 12/25/2016   Procedure: PACEMAKER IMPLANT;  Surgeon: Deboraha Sprang, MD;  Location: [facility];  Service: Cardiovascular;  Laterality: N/A;  . TONSILLECTOMY      Prior to Admission medications   Medication Sig Start Date End Date Taking? Authorizing Provider  acetaminophen (TYLENOL) 500 MG tablet Take 500-1,000 mg by mouth every 6 (six) hours as needed for mild pain, moderate pain or headache.    Yes [provider]  allopurinol (ZYLOPRIM) 300 MG tablet Take 300 mg by mouth daily.   Yes [provider]  amLODipine (NORVASC) 5 MG tablet Take 2.5 mg by mouth daily.    Yes [provider]  atorvastatin (LIPITOR) 20 MG tablet TAKE 1 TABLET BY MOUTH DAILY Patient taking differently: Take 20 mg by mouth daily. 09/08/19  Yes Troy Sine, MD  b complex vitamins tablet Take 1 tablet by mouth daily.   Yes [provider]  furosemide (LASIX) 40 MG tablet Take 0.5 tablets (20 mg total) by mouth daily. 11/17/19  Yes Vann, Jessica U, DO  metoprolol succinate (TOPROL-XL) 25 MG 24 hr tablet TAKE 1 TABLET BY MOUTH DAILY Patient taking differently: Take 25 mg by mouth daily. 01/19/20  Yes Troy Sine, MD  PRILOSEC OTC 20 MG tablet Take 20 mg by mouth at bedtime.   Yes [provider]  Rivaroxaban (XARELTO) 15 MG TABS tablet Take 1 tablet (15 mg total) by mouth daily after supper. Patient taking differently: Take 15 mg by mouth daily with supper. 11/19/19  Yes Geradine Girt, DO  Tamsulosin HCl (FLOMAX) 0.4 MG CAPS Take 1 capsule (0.4 mg total) by mouth daily. 07/20/11  Yes Hunt, Meagan, MD    Scheduled  Meds: . atorvastatin  20 mg Oral Daily  . [START ON 01/24/2020] pantoprazole  40 mg Intravenous Q12H  . tamsulosin  0.4 mg Oral Daily   Continuous Infusions: . pantoprozole (PROTONIX) infusion 8 mg/hr (01/20/20 2247)  . remdesivir 100 mg in NS 100 mL     PRN Meds:.acetaminophen **OR** acetaminophen, albuterol, HYDROcodone-acetaminophen  Allergies as of 01/20/2020 - Review Complete 01/20/2020  Allergen Reaction Noted  . Simvastatin Other (See Comments) 11/14/2019  . Nitrostat [nitroglycerin] Other (See Comments) 08/20/2012    Family History  Problem Relation Age of Onset  . Heart disease Father     Social History   Socioeconomic History  . Marital status: Married    Spouse name: Not on file  . Number of children: Not on file  . Years of education: Not on file  . Highest education level: Not on file  Occupational History  . Not on file  Tobacco Use  . Smoking  status: Former Smoker    Packs/day: 1.00    Years: 30.00    Pack years: 30.00    Types: Cigarettes  . Smokeless tobacco: Never Used  . Tobacco comment: quit ~ 1985  Vaping Use  . Vaping Use: Never used  Substance and Sexual Activity  . Alcohol use: Yes    Alcohol/week: 0.0 standard drinks    Comment: rare beer  . Drug use: No  . Sexual activity: Not on file  Other Topics Concern  . Not on file  Social History Narrative  . Not on file   Social Determinants of Health   Financial Resource Strain: Not on file  Food Insecurity: Not on file  Transportation Needs: Not on file  Physical Activity: Not on file  Stress: Not on file  Social Connections: Not on file  Intimate Partner Violence: Not on file    Review of Systems: All negative except as stated above in HPI.  Physical Exam: Vital signs: Vitals:   01/21/20 0730 01/21/20 0745  BP: 134/65 (!) 142/66  Pulse: 63 70  Resp: (!) 21 18  Temp:    SpO2: 98% 99%  T 97.6   General:   Alert, obese, pleasant, no acute distress  Head: normocephalic,  atraumatic Eyes: anicteric sclera Neck: supple, nontender Lungs:  Clear throughout to auscultation.   No wheezes, crackles, or rhonchi. No acute distress. Heart:  Regular rate and rhythm; no murmurs, clicks, rubs,  or gallops. Abdomen: soft, nontender, nondistended, +BS, midline surgical scar Rectal:  Deferred Ext: no edema Psych: normal mood, affect  GI:  Lab Results: Recent Labs    01/20/20 1757 01/21/20 0600  WBC 7.6 7.2  HGB 8.6* 8.6*  HCT 30.1* 28.1*  PLT 153 141*   BMET Recent Labs    01/20/20 1757  NA 139  K 3.8  CL 108  CO2 16*  GLUCOSE 109*  BUN 52*  CREATININE 4.37*  CALCIUM 8.3*   LFT Recent Labs    01/20/20 1757  PROT 6.4*  ALBUMIN 3.1*  AST 36  ALT 17  ALKPHOS 152*  BILITOT 0.6   PT/INR No results for input(s): LABPROT, INR in the last 72 hours.   Studies/Results: DG Chest Portable 1 View  Result Date: 01/20/2020 CLINICAL DATA:  Fever cough EXAM: PORTABLE CHEST 1 VIEW COMPARISON:  04/03/2019 FINDINGS: Post sternotomy changes. Left-sided pacing device as before. Cardiomegaly. Trace right-sided pleural effusion. No focal consolidation or pneumothorax. IMPRESSION: Cardiomegaly with trace right-sided pleural effusion. Electronically Signed   By: Donavan Foil M.D.   On: 01/20/2020 19:50    Impression/Plan: 80 yo with multiple medical problems including CHF, CAD, Afib on Xarelto presents with one day of melena. COVID positive with upper respiratory symptoms. Hemodynamically stable. Hgb 8.6 up from 7.1 following 1 U PRBCs on 01/15/20. Normal EGD in 2018.   Question mucosal bleeding from anticoagulation vs peptic ulcer bleed. Would manage conservatively with Protonix drip, NPO and hold off on EGD unless becomes hemodynamically unstable with signs of ongoing melena or other signs of GI bleeding. Ideally would hold Xarelto at discharge for 1 week if ok with his cardiologist and patient reports they considered discontinuing it recently but defer that to Dr.  Claiborne Billings. If diarrhea returns then may need C. Diff check. Supportive care.  Discharge timing defer to primary team with Covid positive status but from a GI standpoint would recommend Protonix drip 48 hours and if stable then change to PO PPI BID. [facility] will f/u.  LOS: 1 day   Lear Ng  01/21/2020, 9:34 AM  Questions please call [phone]

## 2020-01-21 NOTE — ED Notes (Signed)
Pt had bloody BM

## 2020-01-22 ENCOUNTER — Encounter (HOSPITAL_COMMUNITY): Payer: Self-pay

## 2020-01-22 ENCOUNTER — Encounter (HOSPITAL_COMMUNITY): Payer: Self-pay | Admitting: Internal Medicine

## 2020-01-22 ENCOUNTER — Ambulatory Visit (HOSPITAL_COMMUNITY): Admission: RE | Admit: 2020-01-22 | Payer: Medicare Other | Source: Ambulatory Visit

## 2020-01-22 DIAGNOSIS — U071 COVID-19: Secondary | ICD-10-CM | POA: Diagnosis not present

## 2020-01-22 DIAGNOSIS — K922 Gastrointestinal hemorrhage, unspecified: Secondary | ICD-10-CM | POA: Diagnosis not present

## 2020-01-22 DIAGNOSIS — N179 Acute kidney failure, unspecified: Secondary | ICD-10-CM | POA: Diagnosis not present

## 2020-01-22 DIAGNOSIS — G4733 Obstructive sleep apnea (adult) (pediatric): Secondary | ICD-10-CM | POA: Diagnosis not present

## 2020-01-22 LAB — C-REACTIVE PROTEIN: CRP: 12.7 mg/dL — ABNORMAL HIGH (ref <1.0)

## 2020-01-22 LAB — COMPREHENSIVE METABOLIC PANEL
ALT: 17 U/L (ref 0–44)
AST: 36 U/L (ref 15–41)
Albumin: 2.6 g/dL — ABNORMAL LOW (ref 3.5–5.0)
Alkaline Phosphatase: 130 U/L — ABNORMAL HIGH (ref 38–126)
Anion gap: 12 (ref 5–15)
BUN: 56 mg/dL — ABNORMAL HIGH (ref 8–23)
CO2: 16 mmol/L — ABNORMAL LOW (ref 22–32)
Calcium: 8.1 mg/dL — ABNORMAL LOW (ref 8.9–10.3)
Chloride: 114 mmol/L — ABNORMAL HIGH (ref 98–111)
Creatinine, Ser: 3.87 mg/dL — ABNORMAL HIGH (ref 0.61–1.24)
GFR, Estimated: 15 mL/min — ABNORMAL LOW (ref >60)
Glucose, Bld: 124 mg/dL — ABNORMAL HIGH (ref 70–99)
Potassium: 4 mmol/L (ref 3.5–5.1)
Sodium: 142 mmol/L (ref 135–145)
Total Bilirubin: 0.8 mg/dL (ref 0.3–1.2)
Total Protein: 5.6 g/dL — ABNORMAL LOW (ref 6.5–8.1)

## 2020-01-22 LAB — CBC WITH DIFFERENTIAL/PLATELET
Abs Immature Granulocytes: 0.05 10*3/uL (ref 0.00–0.07)
Basophils Absolute: 0 10*3/uL (ref 0.0–0.1)
Basophils Relative: 0 %
Eosinophils Absolute: 0 10*3/uL (ref 0.0–0.5)
Eosinophils Relative: 0 %
HCT: 28.3 % — ABNORMAL LOW (ref 39.0–52.0)
Hemoglobin: 8.2 g/dL — ABNORMAL LOW (ref 13.0–17.0)
Immature Granulocytes: 1 %
Lymphocytes Relative: 9 %
Lymphs Abs: 0.7 10*3/uL (ref 0.7–4.0)
MCH: 24.8 pg — ABNORMAL LOW (ref 26.0–34.0)
MCHC: 29 g/dL — ABNORMAL LOW (ref 30.0–36.0)
MCV: 85.8 fL (ref 80.0–100.0)
Monocytes Absolute: 0.5 10*3/uL (ref 0.1–1.0)
Monocytes Relative: 6 %
Neutro Abs: 6.3 10*3/uL (ref 1.7–7.7)
Neutrophils Relative %: 84 %
Platelets: 139 10*3/uL — ABNORMAL LOW (ref 150–400)
RBC: 3.3 MIL/uL — ABNORMAL LOW (ref 4.22–5.81)
RDW: 19.5 % — ABNORMAL HIGH (ref 11.5–15.5)
WBC: 7.5 10*3/uL (ref 4.0–10.5)
nRBC: 0 % (ref 0.0–0.2)

## 2020-01-22 LAB — FERRITIN: Ferritin: 161 ng/mL (ref 24–336)

## 2020-01-22 LAB — D-DIMER, QUANTITATIVE: D-Dimer, Quant: 2 ug/mL-FEU — ABNORMAL HIGH (ref 0.00–0.50)

## 2020-01-22 LAB — PHOSPHORUS: Phosphorus: 4.4 mg/dL (ref 2.5–4.6)

## 2020-01-22 LAB — MAGNESIUM: Magnesium: 1.9 mg/dL (ref 1.7–2.4)

## 2020-01-22 MED ORDER — PANTOPRAZOLE SODIUM 40 MG PO TBEC
40.0000 mg | DELAYED_RELEASE_TABLET | Freq: Two times a day (BID) | ORAL | Status: DC
  Administered 2020-01-22 – 2020-01-26 (×9): 40 mg via ORAL
  Filled 2020-01-22 (×9): qty 1

## 2020-01-22 MED ORDER — MAGNESIUM SULFATE 2 GM/50ML IV SOLN
2.0000 g | Freq: Once | INTRAVENOUS | Status: AC
  Administered 2020-01-22: 2 g via INTRAVENOUS
  Filled 2020-01-22: qty 50

## 2020-01-22 MED ORDER — SODIUM CHLORIDE 0.9 % IV SOLN
100.0000 mg | Freq: Once | INTRAVENOUS | Status: DC
  Filled 2020-01-22: qty 20

## 2020-01-22 NOTE — Plan of Care (Signed)
Patient arrived to  room 19 on [address] from the ER. Alert and oriented x4. Instructions verbally and return demonstration on bed function, calling the nurse, TV, menu and ordering food.  Problem: Education: Goal: Knowledge of General Education information will improve Description: Including pain rating scale, medication(s)/side effects and non-pharmacologic comfort measures Outcome: Progressing   Problem: Health Behavior/Discharge Planning: Goal: Ability to manage health-related needs will improve Outcome: Progressing   Problem: Clinical Measurements: Goal: Ability to maintain clinical measurements within normal limits will improve Outcome: Progressing Goal: Will remain free from infection Outcome: Progressing Goal: Diagnostic test results will improve Outcome: Progressing Goal: Respiratory complications will improve Outcome: Progressing Goal: Cardiovascular complication will be avoided Outcome: Progressing   Problem: Activity: Goal: Risk for activity intolerance will decrease Outcome: Progressing   Problem: Nutrition: Goal: Adequate nutrition will be maintained Outcome: Progressing   Problem: Coping: Goal: Level of anxiety will decrease Outcome: Progressing   Problem: Elimination: Goal: Will not experience complications related to bowel motility Outcome: Progressing Goal: Will not experience complications related to urinary retention Outcome: Progressing   Problem: Pain Managment: Goal: General experience of comfort will improve Outcome: Progressing   Problem: Safety: Goal: Ability to remain free from injury will improve Outcome: Progressing   Problem: Skin Integrity: Goal: Risk for impaired skin integrity will decrease Outcome: Progressing   Problem: Education: Goal: Ability to identify signs and symptoms of gastrointestinal bleeding will improve Outcome: Progressing   Problem: Bowel/Gastric: Goal: Will show no signs and symptoms of gastrointestinal  bleeding Outcome: Progressing   Problem: Fluid Volume: Goal: Will show no signs and symptoms of excessive bleeding Outcome: Progressing   Problem: Clinical Measurements: Goal: Complications related to the disease process, condition or treatment will be avoided or minimized Outcome: Progressing

## 2020-01-22 NOTE — ED Notes (Signed)
Breakfast Ordered 

## 2020-01-22 NOTE — ED Notes (Signed)
Transported upstairs

## 2020-01-22 NOTE — Progress Notes (Signed)
Pt refused CPAP for the night. RT will continue to monitor.   

## 2020-01-22 NOTE — Evaluation (Signed)
Physical Therapy Evaluation Patient Details Name: Paul Price MRN: [id number] DOB: 06-22-1940 Today's Date: 01/22/2020   History of Present Illness  Pt is an 80 y/o male admitted secondary to dark stools likely from upper GI bleed. Pt also found to be COVID +. PMH includes a fib, DVT, HTN, gout, and OSA on CPAP.  Clinical Impression  Pt admitted secondary to problem above with deficits below. Pt requiring min guard A to stand and transfer to/from chair this session. Mild instability noted, however, no overt LOB noted. Pt's son present throughout session. Anticipate pt will progress well and be able to d/c home with HHPT and family support. Will continue to follow acutely.     Follow Up Recommendations Home health PT;Supervision for mobility/OOB    Equipment Recommendations  None recommended by PT    Recommendations for Other Services       Precautions / Restrictions Precautions Precautions: Fall Restrictions Weight Bearing Restrictions: No      Mobility  Bed Mobility Overal bed mobility: Needs Assistance Bed Mobility: Supine to Sit;Sit to Supine     Supine to sit: Min assist Sit to supine: Supervision   General bed mobility comments: Min A For trunk assist to come to sitting on stretcher. Supervision for safety to return to supine.    Transfers Overall transfer level: Needs assistance Equipment used: 1 person hand held assist Transfers: Sit to/from [facility] Sit to Stand: Min guard Stand pivot transfers: Min guard       General transfer comment: Min guard for safety to transfer to chair to switch bed out of room. Mild instability noted. Min guard for transfer back to bed.  Ambulation/Gait                Stairs            Wheelchair Mobility    Modified Rankin (Stroke Patients Only)       Balance Overall balance assessment: Needs assistance Sitting-balance support: No upper extremity supported;Feet supported Sitting  balance-[name] Scale: Good     Standing balance support: No upper extremity supported;Single extremity supported Standing balance-[name] Scale: Fair Standing balance comment: able to maintain static standing without UE support                             Pertinent Vitals/Pain Pain Assessment: No/denies pain    Home Living Family/patient expects to be discharged to:: Private residence Living Arrangements: Spouse/significant other Available Help at Discharge: Family;Available 24 hours/day Type of Home: House Home Access: Stairs to enter Entrance Stairs-Rails: Right Entrance Stairs-Number of Steps: 6 Home Layout: One level Home Equipment: Walker - 2 wheels;Cane - single point;Other (comment);Wheelchair - manual;Shower seat (walking stick)      Prior Function Level of Independence: Independent               Hand Dominance        Extremity/Trunk Assessment   Upper Extremity Assessment Upper Extremity Assessment: Defer to OT evaluation    Lower Extremity Assessment Lower Extremity Assessment: Generalized weakness    Cervical / Trunk Assessment Cervical / Trunk Assessment: Normal  Communication   Communication: HOH  Cognition Arousal/Alertness: Awake/alert Behavior During Therapy: WFL for tasks assessed/performed Overall Cognitive Status: Within Functional Limits for tasks assessed  General Comments General comments (skin integrity, edema, etc.): Pt's son present during session. Oxygen sats from 90-94% on RA    Exercises     Assessment/Plan    PT Assessment Patient needs continued PT services  PT Problem List Decreased balance;Decreased activity tolerance;Decreased strength;Decreased mobility;Decreased knowledge of use of DME       PT Treatment Interventions Gait training;DME instruction;Functional mobility training;Therapeutic activities;Therapeutic exercise;Balance training;Patient/family  education    PT Goals (Current goals can be found in the Care Plan section)  Acute Rehab PT Goals Patient Stated Goal: to go home PT Goal Formulation: With patient Time For Goal Achievement: 02/05/20 Potential to Achieve Goals: Good    Frequency Min 3X/week   Barriers to discharge        Co-evaluation               AM-PAC PT "6 Clicks" Mobility  Outcome Measure Help needed turning from your back to your side while in a flat bed without using bedrails?: A Little Help needed moving from lying on your back to sitting on the side of a flat bed without using bedrails?: A Little Help needed moving to and from a bed to a chair (including a wheelchair)?: A Little Help needed standing up from a chair using your arms (e.g., wheelchair or bedside chair)?: A Little Help needed to walk in hospital room?: A Little Help needed climbing 3-5 steps with a railing? : A Little 6 Click Score: 18    End of Session   Activity Tolerance: Patient tolerated treatment well Patient left: in bed;with call bell/phone within reach;with family/visitor present (in bed in ED) Nurse Communication: Mobility status PT Visit Diagnosis: Unsteadiness on feet (R26.81);Muscle weakness (generalized) (M62.81)    Time: 3143-8887 PT Time Calculation (min) (ACUTE ONLY): 23 min   Charges:   PT Evaluation $PT Eval Moderate Complexity: 1 Mod PT Treatments $Therapeutic Activity: 8-22 mins        Lou Miner, DPT  Acute Rehabilitation Services  Pager: [phone] Office: [phone]   [name] 01/22/2020, 12:13 PM

## 2020-01-22 NOTE — Progress Notes (Signed)
Patient ID: Paul Price, male   DOB: [birth year], 80 y.o.   MRN: [id number]   Patient had a normal EGD and a capsule endoscopy showing minimal gastritis and duodenitis in 11/2019. No BMs or bleeding reported in chart. Hgb stable at 8.2. Change to PPI PO BID and advance diet. No plans for repeat EGD. Needs outpt hematology evaluation. Will sign off. Call if questions.

## 2020-01-22 NOTE — ED Notes (Signed)
Lunch Tray Ordered @ 1022. 

## 2020-01-22 NOTE — Progress Notes (Signed)
PROGRESS NOTE                                                                                                                                                                                                             Patient Demographics:    Paul Price, is a 80 y.o. male, DOB - Jan 22, 1940, ZOX:[id number]  Outpatient Primary MD for the patient is [name], MD   Admit date - 01/20/2020   LOS - 2  No chief complaint on file.      Brief Narrative: Patient is a 80 y.o. male with PMHx of PAF on Xarelto, secondary AV block-s/p PPM placement, chronic systolic heart failure, CAD s/p CABG, stage V CKD-prior history of GI bleeding-presenting with 4-day history of fever, and diarrhea, and black stools.  Found to have upper GI bleeding-and COVID-19 infection.  See below for further details.   COVID-19 vaccinated status: Vaccinated-but not boosted.  Significant Events: 1/11>> Admit to [facility] for UGI bleeding and COVID-19 infection.  Significant studies: 1/11>>Chest x-ray: No focal consolidation/pneumothorax  COVID-19 medications: Remdesivir: 1/11 x 3 days  Antibiotics: None  Microbiology data: 1/12>>Blood culture: no growth  Procedures: None  Consults: None  DVT prophylaxis: SCDs Start: 01/20/20 2203    Subjective:   Had 1 bloody BM last evening per nursing chart documentation.  He otherwise had a uneventful night.   Assessment  & Plan :   Upper GI bleeding: Has had prior episodes in the past-recent EGD/capsule study without any obvious abnormalities.  Had 1 episode of hematochezia yesterday-but none overnight-hemoglobin remained stable.  Reevaluated by GI today-recommendations are to advance diet-change PPI to twice daily dosing-and monitor closely.  GI has signed off on 1/13.  Anticoagulation remains on hold-suspect reasonable to hold for at least another week before considering resuming.  COVID-19  infection: Continues to cough-O2 saturations mostly in the low 90s this morning-watch closely-plan is to continue with 3 days of Remdesivir for now-however if there is O2 saturation starts trending down consistently-May need to repeat imaging.  Spoke with son-on 1/13-okay to continue with Remdesivir as planned (yesterday family was hesitant-see notes)   Fever: afebrile this am O2 requirements:  SpO2: (!) 89 %   COVID-19 Labs: Recent Labs    01/21/20 0000 01/21/20 0600 01/22/20 0518  DDIMER 1.91*  --  2.00*  FERRITIN  --  139 161  LDH  --  196*  --   CRP  --  12.7* 12.7*       Component Value Date/Time   BNP 881.8 (H) 01/21/2020 0900    Recent Labs  Lab 01/21/20 0600  PROCALCITON 1.01    Lab Results  Component Value Date   SARSCOV2NAA POSITIVE (A) 01/20/2020   SARSCOV2NAA NEGATIVE 11/14/2019   [place] NEGATIVE 05/17/2019   [place] NEGATIVE 03/27/2019     Normocytic anemia: Secondary to chronic kidney disease-required 1 unit of PRBC on 1/6 as an outpatient.  Hemoglobin relatively stable-no evidence of significant blood loss at this point.  Follow closely.  AKI on CKD stage V: AKI hemodynamically mediated-creatinine trending down-follow periodically-continue supportive care-avoid nephrotoxic agents.  If renal function worsens significantly-we will consult nephrology.  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  PAF: Paced rhythm-resume metoprolol-hold Xarelto due to GI bleeding.  Chronic systolic heart failure (EF 40-45% by echo on 01/08/2020): Compensated-resume diuretics over the next few days once renal function improves further.  History of second-degree AV block-s/p PPM placement  CAD s/p CABG-92-s/p DES/12: No anginal symptoms   HTN: BP better controlled-on amlodipine and metoprolol.  COPD: Stable-continue as needed inhalers  OSA: On  CPAP  BPH: On Flomax  ABG:    Component Value Date/Time   TCO2 18 (L) 09/06/2016 1418    Vent Settings: N/A    Condition - Stable  Family Communication  :  Son-Jeff-[phone] updated over the phone 1/13  Code Status :  Full Code  Diet :  Diet Order            Diet full liquid Room service appropriate? Yes; Fluid consistency: Thin  Diet effective now                  Disposition Plan  :   Status is: Inpatient  Remains inpatient appropriate because:Inpatient level of care appropriate due to severity of illness   Dispo: The patient is from: Home              Anticipated d/c is to: Home              Anticipated d/c date is: 3 days              Patient currently is not medically stable to d/c.    Barriers to discharge: UGI bleeding-on IV PPI/complete 3 days of IV Remdesivir  Antimicorbials  :    Anti-infectives (From admission, onward)   Start     Dose/Rate Route Frequency Ordered Stop   01/23/20 1000  remdesivir 100 mg in sodium chloride 0.9 % 100 mL IVPB        100 mg 200 mL/hr over 30 Minutes Intravenous  Once 01/22/20 1056     01/21/20 1000  remdesivir 100 mg in sodium chloride 0.9 % 100 mL IVPB       "Followed by" Linked Group Details   100 mg 200 mL/hr over 30 Minutes Intravenous Daily 01/20/20 2339 01/23/20 0959   01/20/20 2345  remdesivir 200 mg in sodium chloride 0.9% 250 mL IVPB       "Followed by" Linked Group Details   200 mg 580 mL/hr over 30 Minutes Intravenous Once 01/20/20 2339 01/21/20 0230      Inpatient Medications  Scheduled Meds: . amLODipine  2.5 mg Oral Daily  . atorvastatin  20 mg Oral Daily  . metoprolol succinate  25 mg Oral Daily  . pantoprazole  40 mg Oral BID  . sodium bicarbonate  650 mg Oral BID  . tamsulosin  0.4 mg Oral Daily   Continuous Infusions: . remdesivir 100 mg in NS 100 mL Stopped (01/22/20 1138)  . [START ON 01/23/2020] remdesivir 100 mg in NS 100 mL     PRN Meds:.acetaminophen **OR** acetaminophen,  albuterol, HYDROcodone-acetaminophen   Time Spent in minutes  35    See all Orders from today for further details   Oren Binet M.D on 01/22/2020 at 11:48 AM  To page go to [email or web address] - use universal password  [facility] -  Office  [phone]    Objective:   Vitals:   01/22/20 0300 01/22/20 0400 01/22/20 0600 01/22/20 1000  BP: (!) 146/65 (!) 144/66 (!) 143/67 126/66  Pulse: 80 79 78 (!) 48  Resp: (!) 26 (!) 26 (!) 25 (!) 21  Temp:    97.6 F (36.4 C)  TempSrc:    Oral  SpO2: 92% 91% 92% (!) 89%  Weight:      Height:        Wt Readings from Last 3 Encounters:  01/20/20 82.1 kg  12/10/19 84.4 kg  11/14/19 82.1 kg     Intake/Output Summary (Last 24 hours) at 01/22/2020 1148 Last data filed at 01/21/2020 2019 Gross per 24 hour  Intake 1176.66 ml  Output -  Net 1176.66 ml     Physical Exam Gen Exam:Alert awake-not in any distress HEENT:atraumatic, normocephalic Chest: B/L clear to auscultation anteriorly CVS:S1S2 regular Abdomen:soft non tender, non distended Extremities:no edema Neurology: Non focal Skin: no rash   Data Review:    CBC Recent Labs  Lab 01/20/20 1757 01/21/20 0600 01/22/20 0518  WBC 7.6 7.2 7.5  HGB 8.6* 8.6* 8.2*  HCT 30.1* 28.1* 28.3*  PLT 153 141* 139*  MCV 87.2 84.6 85.8  MCH 24.9* 25.9* 24.8*  MCHC 28.6* 30.6 29.0*  RDW 19.6* 19.4* 19.5*  LYMPHSABS  --  0.9 0.7  MONOABS  --  0.4 0.5  EOSABS  --  0.0 0.0  BASOSABS  --  0.0 0.0    Chemistries  Recent Labs  Lab 01/20/20 1757 01/21/20 0600 01/22/20 0518  NA 139 141 142  K 3.8 3.9 4.0  CL 108 111 114*  CO2 16* 16* 16*  GLUCOSE 109* 117* 124*  BUN 52* 51* 56*  CREATININE 4.37* 3.95* 3.87*  CALCIUM 8.3* 8.4* 8.1*  MG  --  2.1 1.9  AST 36 36 36  ALT 17 18 17   ALKPHOS 152* 141* 130*  BILITOT 0.6 0.5 0.8   ------------------------------------------------------------------------------------------------------------------ No results for input(s):  CHOL, HDL, LDLCALC, TRIG, CHOLHDL, LDLDIRECT in the last 72 hours.  No results found for: HGBA1C ------------------------------------------------------------------------------------------------------------------ Recent Labs    01/21/20 0600  TSH 2.159   ------------------------------------------------------------------------------------------------------------------ Recent Labs    01/21/20 0600 01/22/20 0518  FERRITIN 139 161    Coagulation profile No results for input(s): INR, PROTIME in the last 168 hours.  Recent Labs    01/21/20 0000 01/22/20 0518  DDIMER 1.91* 2.00*    Cardiac Enzymes No results for input(s): CKMB, TROPONINI, MYOGLOBIN in the last 168 hours.  Invalid input(s): CK ------------------------------------------------------------------------------------------------------------------    Component Value Date/Time   BNP 881.8 (H) 01/21/2020 0900    Micro Results Recent Results (from the past 240 hour(s))  SARS CORONAVIRUS 2 (TAT 6-24 HRS) Nasopharyngeal Nasopharyngeal Swab     Status: Abnormal   Collection Time: 01/20/20  6:32  PM   Specimen: Nasopharyngeal Swab  Result Value Ref Range Status   SARS Coronavirus 2 POSITIVE (A) NEGATIVE Final    Comment: (NOTE) SARS-CoV-2 target nucleic acids are DETECTED.  The SARS-CoV-2 RNA is generally detectable in upper and lower respiratory specimens during the acute phase of infection. Positive results are indicative of the presence of SARS-CoV-2 RNA. Clinical correlation with patient history and other diagnostic information is  necessary to determine patient infection status. Positive results do not rule out bacterial infection or co-infection with other viruses.  The expected result is Negative.  Fact Sheet for Patients: SugarRoll.be  Fact Sheet for Healthcare Providers: https://www.woods-mathews.com/  This test is not yet approved or cleared by the [place]  FDA and  has been authorized for detection and/or diagnosis of SARS-CoV-2 by FDA under an Emergency Use Authorization (EUA). This EUA will remain  in effect (meaning this test can be used) for the duration of the COVID-19 declaration under Section 564(b)(1) of the Act, 21 U. S.C. section 360bbb-3(b)(1), unless the authorization is terminated or revoked sooner.   Performed at [facility], [place] [address], [place], [place] [address]   Culture, blood (Routine X 2) w Reflex to ID Panel     Status: None (Preliminary result)   Collection Time: 01/21/20  8:28 AM   Specimen: BLOOD RIGHT HAND  Result Value Ref Range Status   Specimen Description BLOOD RIGHT HAND  Final   Special Requests   Final    BOTTLES DRAWN AEROBIC AND ANAEROBIC Blood Culture results may not be optimal due to an inadequate volume of blood received in culture bottles   Culture   Final    NO GROWTH < 24 HOURS Performed at [facility], [place] [address] [place], [place] [address]    Report Status PENDING  Incomplete    Radiology Reports DG Chest Portable 1 View  Result Date: 01/20/2020 CLINICAL DATA:  Fever cough EXAM: PORTABLE CHEST 1 VIEW COMPARISON:  04/03/2019 FINDINGS: Post sternotomy changes. Left-sided pacing device as before. Cardiomegaly. Trace right-sided pleural effusion. No focal consolidation or pneumothorax. IMPRESSION: Cardiomegaly with trace right-sided pleural effusion. Electronically Signed   By: Donavan Foil M.D.   On: 01/20/2020 19:50   ECHOCARDIOGRAM COMPLETE  Result Date: 01/08/2020    ECHOCARDIOGRAM REPORT   Patient Name:   NYHEIM SEUFERT Date of Exam: 01/08/2020 Medical Rec #:  [id number]      Height:       66.0 in Accession #:    [id number]     Weight:       186.0 lb Date of Birth:  December 28, 1940       BSA:          1.939 m Patient Age:    20 years       BP:           152/74 mmHg Patient Gender: M              HR:           60 bpm. 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  CAD, Prior CABG, Abnormal ECG and Pacemaker,                 Arrythmias:Atrial Fibrillation and RBBB,                 Signs/Symptoms:Shortness of Breath and Chest Pain; Risk                 Factors:Hypertension, Dyslipidemia, Family History of Coronary                 Artery Disease, Former Smoker and Sleep Apnea.  Sonographer:    Deliah Boston RDCS Referring Phys: [place]  1. Left ventricular ejection fraction, by estimation, is 40 to 45%. The left ventricle has mildly decreased function. Left ventricular endocardial border not optimally defined to evaluate regional wall motion. Left ventricular diastolic function could not be evaluated. Elevated left ventricular end-diastolic pressure.  2. Right ventricular systolic function is normal. The right ventricular size is normal. There is mildly elevated pulmonary artery systolic pressure. The estimated right ventricular systolic pressure is 51.7 mmHg.  3. Left atrial size was mildly dilated.  4. The mitral valve is normal in structure. Mild mitral valve regurgitation. No evidence of mitral stenosis.  5. The aortic valve is normal in structure. Aortic valve regurgitation is not visualized. No aortic stenosis is present.  6. Aortic dilatation noted. There is mild dilatation of the aortic root, measuring 39 mm.  7. The inferior vena cava is normal in size with <50% respiratory variability, suggesting right atrial pressure of 8 mmHg.  8. Compared to study of 2018, PASP has decreased from 61 to 22mmHg. FINDINGS  Left Ventricle: Left ventricular ejection fraction, by estimation, is 40 to 45%. The left ventricle has mildly decreased function. Left ventricular endocardial border not optimally defined to evaluate regional  wall motion. The left ventricular internal cavity size was normal in size. There is no left ventricular hypertrophy. Abnormal (paradoxical) septal motion, consistent with RV pacemaker. Left ventricular diastolic function could not be evaluated due to paced rhythm. Left ventricular diastolic function could not be evaluated. Elevated left ventricular end-diastolic pressure. Right Ventricle: The right ventricular size is normal. No increase in right ventricular wall thickness. Right ventricular systolic function is normal. There is mildly elevated pulmonary artery systolic pressure. The tricuspid regurgitant velocity is 2.88  m/s, and with an assumed right atrial pressure of 8 mmHg, the estimated right ventricular systolic pressure is 61.6 mmHg. Left Atrium: Left atrial size was mildly dilated. Right Atrium: Right atrial size was normal in size. Pericardium: There is no evidence of pericardial effusion. Mitral Valve: The mitral valve is normal in structure. Mild mitral annular calcification. Mild mitral valve regurgitation. No evidence of mitral valve stenosis. Tricuspid Valve: The tricuspid valve is normal in structure. Tricuspid valve regurgitation is mild . No evidence of tricuspid stenosis. Aortic Valve: The aortic valve is normal in structure. Aortic valve regurgitation is not visualized. No aortic stenosis is present. Pulmonic Valve: The pulmonic valve was normal in structure. Pulmonic valve regurgitation is trivial. No evidence of pulmonic stenosis. Aorta: Aortic dilatation noted. There is mild dilatation of the aortic root, measuring 39 mm. Venous: The inferior vena cava is normal in size with less than 50% respiratory variability, suggesting right atrial pressure of 8 mmHg. IAS/Shunts: No atrial level shunt detected by color flow Doppler. Additional Comments: A pacer wire is visualized.  LEFT VENTRICLE PLAX 2D LVIDd:         5.20 cm  Diastology  LVIDs:         4.40 cm  LV e' medial:    4.68 cm/s LV PW:          1.10 cm  LV E/e' medial:  21.3 LV IVS:        0.80 cm  LV e' lateral:   6.92 cm/s LVOT diam:     2.20 cm  LV E/e' lateral: 14.4 LV SV:         67 LV SV Index:   34 LVOT Area:     3.80 cm  RIGHT VENTRICLE TAPSE (M-mode): 1.6 cm LEFT ATRIUM             Index       RIGHT ATRIUM           Index LA diam:        4.50 cm 2.32 cm/m  RA Area:     25.80 cm LA Vol (A2C):   68.9 ml 35.53 ml/m RA Volume:   82.70 ml  42.65 ml/m LA Vol (A4C):   64.4 ml 33.21 ml/m LA Biplane Vol: 72.6 ml 37.44 ml/m  AORTIC VALVE LVOT Vmax:   79.20 cm/s LVOT Vmean:  50.500 cm/s LVOT VTI:    0.175 m  AORTA Ao Root diam: 3.90 cm Ao Asc diam:  3.40 cm MITRAL VALVE               TRICUSPID VALVE MV Area (PHT)  cm         TR Peak grad:   33.2 mmHg MV Decel Time: 216 msec    TR Vmax:        288.00 cm/s MR Peak grad: 115.8 mmHg MR Mean grad: 71.0 mmHg    SHUNTS MR Vmax:      538.00 cm/s  Systemic VTI:  0.18 m MR Vmean:     395.0 cm/s   Systemic Diam: 2.20 cm MV E velocity: 99.50 cm/s MV A velocity: 63.40 cm/s MV E/A ratio:  1.57 Fransico Him MD Electronically signed by Fransico Him MD Signature Date/Time: 01/08/2020/1:34:37 PM    Final

## 2020-01-23 ENCOUNTER — Inpatient Hospital Stay (HOSPITAL_COMMUNITY): Payer: Medicare Other

## 2020-01-23 ENCOUNTER — Ambulatory Visit (INDEPENDENT_AMBULATORY_CARE_PROVIDER_SITE_OTHER): Payer: Medicare Other

## 2020-01-23 DIAGNOSIS — G4733 Obstructive sleep apnea (adult) (pediatric): Secondary | ICD-10-CM | POA: Diagnosis not present

## 2020-01-23 DIAGNOSIS — I441 Atrioventricular block, second degree: Secondary | ICD-10-CM | POA: Diagnosis not present

## 2020-01-23 DIAGNOSIS — N179 Acute kidney failure, unspecified: Secondary | ICD-10-CM | POA: Diagnosis not present

## 2020-01-23 DIAGNOSIS — K922 Gastrointestinal hemorrhage, unspecified: Secondary | ICD-10-CM | POA: Diagnosis not present

## 2020-01-23 DIAGNOSIS — U071 COVID-19: Secondary | ICD-10-CM | POA: Diagnosis not present

## 2020-01-23 LAB — URINALYSIS, ROUTINE W REFLEX MICROSCOPIC
Bilirubin Urine: NEGATIVE
Glucose, UA: 50 mg/dL — AB
Ketones, ur: NEGATIVE mg/dL
Nitrite: POSITIVE — AB
Protein, ur: >=300 mg/dL — AB
Specific Gravity, Urine: 1.014 (ref 1.005–1.030)
WBC, UA: >50 WBC/hpf — ABNORMAL HIGH (ref 0–5)
pH: 5 (ref 5.0–8.0)

## 2020-01-23 LAB — CBC WITH DIFFERENTIAL/PLATELET
Abs Immature Granulocytes: 0.07 10*3/uL (ref 0.00–0.07)
Basophils Absolute: 0 10*3/uL (ref 0.0–0.1)
Basophils Relative: 0 %
Eosinophils Absolute: 0 10*3/uL (ref 0.0–0.5)
Eosinophils Relative: 0 %
HCT: 27.4 % — ABNORMAL LOW (ref 39.0–52.0)
Hemoglobin: 8 g/dL — ABNORMAL LOW (ref 13.0–17.0)
Immature Granulocytes: 1 %
Lymphocytes Relative: 10 %
Lymphs Abs: 0.6 10*3/uL — ABNORMAL LOW (ref 0.7–4.0)
MCH: 24.7 pg — ABNORMAL LOW (ref 26.0–34.0)
MCHC: 29.2 g/dL — ABNORMAL LOW (ref 30.0–36.0)
MCV: 84.6 fL (ref 80.0–100.0)
Monocytes Absolute: 0.5 10*3/uL (ref 0.1–1.0)
Monocytes Relative: 8 %
Neutro Abs: 5.1 10*3/uL (ref 1.7–7.7)
Neutrophils Relative %: 81 %
Platelets: 128 10*3/uL — ABNORMAL LOW (ref 150–400)
RBC: 3.24 MIL/uL — ABNORMAL LOW (ref 4.22–5.81)
RDW: 19.3 % — ABNORMAL HIGH (ref 11.5–15.5)
WBC: 6.4 10*3/uL (ref 4.0–10.5)
nRBC: 0.3 % — ABNORMAL HIGH (ref 0.0–0.2)

## 2020-01-23 LAB — COMPREHENSIVE METABOLIC PANEL
ALT: 18 U/L (ref 0–44)
AST: 40 U/L (ref 15–41)
Albumin: 2.6 g/dL — ABNORMAL LOW (ref 3.5–5.0)
Alkaline Phosphatase: 133 U/L — ABNORMAL HIGH (ref 38–126)
Anion gap: 13 (ref 5–15)
BUN: 64 mg/dL — ABNORMAL HIGH (ref 8–23)
CO2: 17 mmol/L — ABNORMAL LOW (ref 22–32)
Calcium: 8.2 mg/dL — ABNORMAL LOW (ref 8.9–10.3)
Chloride: 114 mmol/L — ABNORMAL HIGH (ref 98–111)
Creatinine, Ser: 4.21 mg/dL — ABNORMAL HIGH (ref 0.61–1.24)
GFR, Estimated: 13 mL/min — ABNORMAL LOW (ref >60)
Glucose, Bld: 139 mg/dL — ABNORMAL HIGH (ref 70–99)
Potassium: 3.9 mmol/L (ref 3.5–5.1)
Sodium: 144 mmol/L (ref 135–145)
Total Bilirubin: 0.8 mg/dL (ref 0.3–1.2)
Total Protein: 5.9 g/dL — ABNORMAL LOW (ref 6.5–8.1)

## 2020-01-23 LAB — CUP PACEART REMOTE DEVICE CHECK
Battery Remaining Longevity: 74 mo
Battery Voltage: 2.91 V
Brady Statistic AP VP Percent: 40.21 %
Brady Statistic AP VS Percent: 0.01 %
Brady Statistic AS VP Percent: 59.02 %
Brady Statistic AS VS Percent: 0.76 %
Brady Statistic RA Percent Paced: 40.8 %
Brady Statistic RV Percent Paced: 99.23 %
Date Time Interrogation Session: 20220113212736
Implantable Lead Implant Date: 20181217
Implantable Lead Implant Date: 20181217
Implantable Lead Location: 753859
Implantable Lead Location: 753859
Implantable Lead Model: 3830
Implantable Lead Model: 5076
Implantable Pulse Generator Implant Date: 20181217
Lead Channel Impedance Value: 266 Ohm
Lead Channel Impedance Value: 285 Ohm
Lead Channel Impedance Value: 342 Ohm
Lead Channel Impedance Value: 361 Ohm
Lead Channel Pacing Threshold Amplitude: 0.5 V
Lead Channel Pacing Threshold Amplitude: 0.75 V
Lead Channel Pacing Threshold Pulse Width: 0.4 ms
Lead Channel Pacing Threshold Pulse Width: 0.4 ms
Lead Channel Sensing Intrinsic Amplitude: 0.625 mV
Lead Channel Sensing Intrinsic Amplitude: 0.625 mV
Lead Channel Sensing Intrinsic Amplitude: 12.5 mV
Lead Channel Sensing Intrinsic Amplitude: 12.5 mV
Lead Channel Setting Pacing Amplitude: 2 V
Lead Channel Setting Pacing Amplitude: 2.5 V
Lead Channel Setting Pacing Pulse Width: 1 ms
Lead Channel Setting Sensing Sensitivity: 1.2 mV

## 2020-01-23 LAB — MAGNESIUM: Magnesium: 2.5 mg/dL — ABNORMAL HIGH (ref 1.7–2.4)

## 2020-01-23 LAB — FERRITIN: Ferritin: 143 ng/mL (ref 24–336)

## 2020-01-23 LAB — C-REACTIVE PROTEIN: CRP: 14.1 mg/dL — ABNORMAL HIGH (ref <1.0)

## 2020-01-23 LAB — PROCALCITONIN: Procalcitonin: 0.93 ng/mL

## 2020-01-23 MED ORDER — LACTATED RINGERS IV SOLN: INTRAVENOUS | Status: AC

## 2020-01-23 MED ORDER — SODIUM BICARBONATE 650 MG PO TABS
1300.0000 mg | ORAL_TABLET | Freq: Three times a day (TID) | ORAL | Status: DC
  Administered 2020-01-23 – 2020-01-26 (×8): 1300 mg via ORAL
  Filled 2020-01-23 (×8): qty 2

## 2020-01-23 MED ORDER — SODIUM CHLORIDE 0.9 % IV SOLN
100.0000 mg | Freq: Every day | INTRAVENOUS | Status: DC
  Administered 2020-01-23 – 2020-01-25 (×3): 100 mg via INTRAVENOUS
  Filled 2020-01-23 (×3): qty 100

## 2020-01-23 MED ORDER — PREDNISONE 20 MG PO TABS
40.0000 mg | ORAL_TABLET | Freq: Every day | ORAL | Status: DC
  Administered 2020-01-23 – 2020-01-26 (×4): 40 mg via ORAL
  Filled 2020-01-23 (×4): qty 2

## 2020-01-23 MED ORDER — PREDNISONE 20 MG PO TABS: 40.0000 mg | ORAL_TABLET | Freq: Every day | ORAL | Status: DC

## 2020-01-23 NOTE — Progress Notes (Signed)
RT NOTE:  Pt refuses CPAP tonight. 

## 2020-01-23 NOTE — TOC Initial Note (Addendum)
Transition of Care [facility]) - Initial/Assessment Note    Patient Details  Name: Paul Price MRN: [id number] Date of Birth: [birth year]  [facility] CM/SW Contact:    Marilu Favre, RN Phone Number: 01/23/2020, 3:00 PM  Clinical Narrative:                  Spoke to patient's son [name] and wife via phone. Discussed HHPT they are in agreement.   Messaged  Amy with [facility] awaiting call back . Amy has accepted referral for HHPT   Patient already has walker and shower chair at home.   He does NOT have oxygen  Expected Discharge Plan: [place]     Patient Goals and CMS Choice Patient states their goals for this hospitalization and ongoing recovery are:: to go home CMS Medicare.gov Compare Post Acute Care list provided to:: Patient Choice offered to / list presented to : Adult Children  Expected Discharge Plan and Services Expected Discharge Plan: [place]   Discharge Planning Services: CM Consult Post Acute Care Choice: Home Health                   DME Arranged: N/A         HH Arranged: PT HH Agency: [facility] Date HH Agency Contacted: 01/23/20   Representative spoke with at New Home: Amy awaiting determination  Prior Living Arrangements/Services   Lives with:: Spouse   Do you feel safe going back to the place where you live?: Yes          Current home services: DME    Activities of Daily Living Home Assistive Devices/Equipment: Cane (specify quad or straight) ADL Screening (condition at time of admission) Patient's cognitive ability adequate to safely complete daily activities?: Yes Is the patient deaf or have difficulty hearing?: No Does the patient have difficulty seeing, even when wearing glasses/contacts?: No Does the patient have difficulty concentrating, remembering, or making decisions?: No Patient able to express need for assistance with ADLs?: Yes Does the patient have difficulty  dressing or bathing?: No Independently performs ADLs?: Yes (appropriate for developmental age) Does the patient have difficulty walking or climbing stairs?: No Weakness of Legs: None Weakness of Arms/Hands: None  Permission Sought/Granted                  Emotional Assessment              Admission diagnosis:  SOB (shortness of breath) [R06.02] UGI bleed [K92.2] Upper GI bleed [K92.2] AKI (acute kidney injury) ([place]) [N17.9] Gastrointestinal hemorrhage, unspecified gastrointestinal hemorrhage type [K92.2] COVID-19 [U07.1] Patient Active Problem List   Diagnosis Date Noted  . UGI bleed 01/22/2020  . COVID-19 virus infection 01/21/2020  . Upper GI bleed 01/20/2020  . Melena 11/16/2019  . Symptomatic anemia 11/14/2019  . Stage 5 chronic kidney disease ([place]) 11/14/2019  . Anemia in chronic kidney disease 08/18/2019  . Renal mass 03/31/2019  . Acute on chronic systolic CHF (congestive heart failure) ([place])   . 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 Symptomatic bradycardia 12/21/2016  . Systolic congestive heart failure ([place]) 12/20/2016  . Normocytic anemia 09/06/2016  . Upper GI bleeding 09/06/2016  . Acute blood loss anemia   . New onset a-fib ([place]) 08/08/2016  . Chronic diastolic (congestive) heart failure ([place]) 08/08/2016  . Hypotension 08/08/2016  . A-fib ([place]) 08/08/2016  . Primary gout   . 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  Syncope 12/02/2013  . Left leg DVT ([place]) 12/02/2013  . Fever 12/02/2013  . AKI (acute kidney injury) ([place]) 12/02/2013  . Renal insufficiency 07/02/2013  . PAF (paroxysmal atrial fibrillation) ([place]) 01/04/2013  . Sinus pause, 4.3 seconds.  Recorded by [facility]. 12/02/2012  . NSVT (nonsustained ventricular tachycardia) ([place]) 12/02/2012  . Dyspnea on exertion 11/20/2012  . 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 HTN (hypertension) 08/20/2012  . Hyperlipidemia with target LDL less than 70 08/20/2012  . Obesity (BMI  30-39.9) 08/20/2012  . OSA on CPAP 08/20/2012  . RBBB 08/20/2012   PCP:  [name], MD Pharmacy:   [facility] [place] [address] Phone: [phone] Fax: [phone]  [facility] [place], [place] [address] [place] [address] Phone: [phone] Fax: [phone]     Social Determinants of Health (SDOH) Interventions    Readmission Risk Interventions No flowsheet data found.

## 2020-01-23 NOTE — Consult Note (Signed)
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 Likely secondary to pre-renal injury. Agree with fluids for today, diuretics on hold -no indications for renal replacement therapy at the moment. Discussions are in place between him and Dr. Marval Regal (primary nephrologist). 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 B/L cr around 3  -UA w/ sediment exam -renal u/s -Continue to monitor daily Cr, Dose meds for GFR<15 -Monitor Daily I/Os, Daily weight  -Maintain MAP>65 for optimal renal perfusion.  -Agree with holding ACE-I, avoid further nephrotoxins including NSAIDS, Morphine.  Unless absolutely necessary, avoid CT with contrast and/or MRI with gadolinium.     Hypertension: -resume home meds, stable BP  UGIB -resolved, a/c on hold  COVID-19 PNA -steroids, remdesivir per primary service  Metabolic acidosis, +AG -corrected AG for albumin: ~16. Likely related to advanced ckd/aki, increasing nahco3 to 1300mg  tid  Anemia due to chronic kidney disease and blood loss: -Transfuse for Hgb<7 g/dL -iron panel  Hypoalbuminemia -push protein  Recommendations conveyed to primary service.    [facility] 01/23/2020 2:59 PM   _____________________________________________________________________________________   History of Present Illness: Paul Price is a/an 80 y.o. male with a past medical history of advanced CKD (follows w/ Dr. 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 Was found to be COVID positive and upper GI bleeding. UGIB resolved. Has been getting steroids and remidisivir. Cr on presentation 4.4, improved slightly and now back up to 4.2  hence consultation. Receiving fluids, diuretics held. Does have diarrhea every now and then but otherwise no complaints, his SOB has been stable/improved. Wants to go home.  Medications:  Current Facility-Administered Medications  Medication Dose Route Frequency Provider Last Rate Last Admin  . acetaminophen (TYLENOL) tablet 650 mg  650 mg Oral Q6H PRN Toy Baker, MD       Or  . acetaminophen (TYLENOL) suppository 650 mg  650 mg Rectal Q6H PRN Doutova, Anastassia, MD      . albuterol (VENTOLIN HFA) 108 (90 Base) MCG/ACT inhaler 2 puff  2 puff Inhalation Q4H PRN Ghimire, Henreitta Leber, MD      . amLODipine (NORVASC) tablet 2.5 mg  2.5 mg Oral Daily Jonetta Osgood, MD   2.5 mg at 01/23/20 0841  . atorvastatin (LIPITOR) tablet 20 mg  20 mg Oral Daily Doutova, Anastassia, MD   20 mg at 01/23/20 0843  . HYDROcodone-acetaminophen (NORCO/VICODIN) 5-325 MG per tablet 1-2 tablet  1-2 tablet Oral Q4H PRN Toy Baker, MD      . lactated ringers infusion   Intravenous Continuous Jonetta Osgood, MD 50 mL/hr at 01/23/20 0852 New Bag at 01/23/20 4401  . metoprolol succinate (TOPROL-XL) 24 hr tablet 25 mg  25 mg Oral Daily Jonetta Osgood, MD   25 mg at 01/23/20 0843  . pantoprazole (PROTONIX) EC tablet 40 mg  40 mg Oral BID Wilford Corner, MD   40 mg at 01/23/20 0843  . predniSONE (DELTASONE) tablet 40 mg  40 mg Oral Q breakfast Ghimire, Henreitta Leber, MD      . remdesivir 100 mg in sodium chloride 0.9 % 100 mL IVPB  100 mg Intravenous Daily Jonetta Osgood, MD 200 mL/hr at 01/23/20 0904 100  mg at 01/23/20 0904  . sodium bicarbonate tablet 650 mg  650 mg Oral BID Jonetta Osgood, MD   650 mg at 01/23/20 0842  . tamsulosin (FLOMAX) capsule 0.4 mg  0.4 mg Oral Daily Doutova, Anastassia, MD   0.4 mg at 01/23/20 0840     ALLERGIES Simvastatin and Nitrostat [nitroglycerin]  MEDICAL HISTORY Past Medical History:  Diagnosis Date  . Arthritis    "shoulders" (09/07/2016)  . Atrial  fibrillation ([place])   . BPH (benign prostatic hyperplasia)   . Chronic kidney disease (CKD), stage III (moderate) ([facility])   . Coronary artery disease   . DDD (degenerative disc disease), cervical   . DVT (deep venous thrombosis) ([place]) 12/2013   LLE  . GERD (gastroesophageal reflux disease)   . Gout   . High cholesterol   . History of blood transfusion 09/06/2016   2 u PRBC  . History of scarlet fever 1940s  . History of stress test 06/2011   No significant ischemia, this is a low risk scan. Clinical correlation recommended Abnormal myocardial perfusion study.  Marland Kitchen Hx of echocardiogram 05/2009   EF 40-45%, he did have mild annular calcification with mild-to-moderate MR and mild TR as well as aortic valve sclerosis. Estimated RV systolic pressure was 21 mm.  . Hypertension   . Iron deficiency anemia   . Kidney failure   . 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 Neck pain    "not chronic" (09/07/2016)  . Obesity   . OSA on CPAP   . RBBB   . Renal insufficiency   . Right renal mass   . Spinal stenosis of lumbar region      SOCIAL HISTORY Social History   Socioeconomic History  . Marital status: Married    Spouse name: Not on file  . Number of children: Not on file  . Years of education: Not on file  . Highest education level: Not on file  Occupational History  . Not on file  Tobacco Use  . Smoking status: Former Smoker    Packs/day: 1.00    Years: 30.00    Pack years: 30.00    Types: Cigarettes  . Smokeless tobacco: Never Used  . Tobacco comment: quit ~ 1985  Vaping Use  . Vaping Use: Never used  Substance and Sexual Activity  . Alcohol use: Yes    Alcohol/week: 0.0 standard drinks    Comment: rare beer  . Drug use: No  . Sexual activity: Not on file  Other Topics Concern  . Not on file  Social History Narrative  . Not on file   Social Determinants of Health   Financial Resource Strain: Not on file  Food Insecurity: Not on file  Transportation Needs: Not  on file  Physical Activity: Not on file  Stress: Not on file  Social Connections: Not on file  Intimate Partner Violence: Not on file     FAMILY HISTORY Family History  Problem Relation Age of Onset  . Heart disease Father        Review of Systems: 12 systems reviewed Otherwise as per HPI, all other systems reviewed and negative  Physical Exam: Vitals:   01/22/20 2100 01/23/20 0422  BP: 123/65 (!) 155/68  Pulse: 76 90  Resp: 20 20  Temp: 98.6 F (37 C) 98.7 F (37.1 C)  SpO2:  99%   No intake/output data recorded.  Intake/Output Summary (Last 24 hours) at 01/23/2020 1459 Last data filed at 01/23/2020 [phone]  Gross per 24 hour  Intake 578.27 ml  Output --  Net 578.27 ml   General: NAD HEENT: anicteric sclera, oropharynx clear without lesions CV: regular rate, normal rhythm Lungs: normal work of breathing, b/l chest expansion Abd: soft, non-tender, non-distended Skin: no visible lesions or rashes Psych: alert, engaged, appropriate mood and affect Musculoskeletal: no edema Neuro: normal speech, no gross focal deficits  Test Results Reviewed Lab Results  Component Value Date   NA 144 01/23/2020   K 3.9 01/23/2020   CL 114 (H) 01/23/2020   CO2 17 (L) 01/23/2020   BUN 64 (H) 01/23/2020   CREATININE 4.21 (H) 01/23/2020   CALCIUM 8.2 (L) 01/23/2020   ALBUMIN 2.6 (L) 01/23/2020   PHOS 4.4 01/22/2020     I have reviewed all relevant outside healthcare records related to the patient's kidney injury.

## 2020-01-23 NOTE — Progress Notes (Signed)
PROGRESS NOTE                                                                                                                                                                                                             Patient Demographics:    Norma Ignasiak, is a 80 y.o. male, DOB - 1940-06-18, SNK:[id number]  Outpatient Primary MD for the patient is [name], MD   Admit date - 01/20/2020   LOS - 3  No chief complaint on file.      Brief Narrative: Patient is a 80 y.o. male with PMHx of PAF on Xarelto, secondary AV block-s/p PPM placement, chronic systolic heart failure, CAD s/p CABG, stage V CKD-prior history of GI bleeding-presenting with 4-day history of fever, and diarrhea, and black stools.  Found to have upper GI bleeding-and COVID-19 infection.  See below for further details.   COVID-19 vaccinated status: Vaccinated-but not boosted.  Significant Events: 1/11>> Admit to [facility] for UGI bleeding and COVID-19 infection.  Significant studies: 1/11>>Chest x-ray: No focal consolidation/pneumothorax 1/14>> chest x-ray: Increasing mixed patchy airspace/interstitial opacity throughout the lungs  COVID-19 medications: Remdesivir: 1/11>>  Antibiotics: None  Microbiology data: 1/12>>Blood culture: no growth  Procedures: None  Consults: None  DVT prophylaxis: SCDs Start: 01/20/20 2203    Subjective:   No further bloody BM-some coughing but denies any shortness of breath.   Assessment  & Plan :   Upper GI bleeding: Has had prior episodes in the past-recent EGD/capsule study without any obvious abnormalities.  No further episodes of hematochezia-hemoglobin relatively stable-diet has been advanced-PPI changed to oral route.  GI has signed off on 1/13.  Anticoagulation remains on hold-suspect reasonable to hold for at least another week before considering resuming.  COVID-19 pneumonia: Continues to cough-O2  saturations remain in the low 90s-chest x-ray this morning now shows pneumonia.  CRP remains elevated.  We will plan on extending Remdesivir to 5 days-start low-dose prednisone.  Encourage incentive spirometry and follow.  Fever: afebrile this am O2 requirements:  SpO2: 96 %   COVID-19 Labs: Recent Labs    01/21/20 0000 01/21/20 0600 01/22/20 0518 01/23/20 0215  DDIMER 1.91*  --  2.00*  --   FERRITIN  --  139 161 143  LDH  --  196*  --   --   CRP  --  12.7*  12.7* 14.1*       Component Value Date/Time   BNP 881.8 (H) 01/21/2020 0900    Recent Labs  Lab 01/21/20 0600 01/23/20 0215  PROCALCITON 1.01 0.93    Lab Results  Component Value Date   SARSCOV2NAA POSITIVE (A) 01/20/2020   SARSCOV2NAA NEGATIVE 11/14/2019   [place] NEGATIVE 05/17/2019   [place] NEGATIVE 03/27/2019     Normocytic anemia: Secondary to chronic kidney disease-required 1 unit of PRBC on 1/6 as an outpatient.  Hemoglobin relatively stable-no evidence of significant blood loss at this point.  Follow closely.  AKI on CKD stage V: AKI hemodynamically mediated-creatinine had improved-however back up again.  We will gently hydrate for a few hours-repeat electrolytes tomorrow.  Have consulted nephrology.    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  PAF: Paced rhythm-resume metoprolol-hold Xarelto due to GI bleeding.  Chronic systolic heart failure (EF 40-45% by echo on 01/08/2020): Compensated-resume diuretics over the next few days once renal function improves further.  History of second-degree AV block-s/p PPM placement  CAD s/p CABG-92-s/p DES/12: No anginal symptoms   HTN: BP better controlled-on amlodipine and metoprolol.  COPD: Stable-continue as needed inhalers  OSA: On CPAP  BPH: On Flomax  ABG:    Component Value Date/Time   TCO2 18 (L) 09/06/2016 1418    Vent  Settings: N/A    Condition - Stable  Family Communication  :  Son-Jeff-[phone] updated over the phone 1/14  Code Status :  Full Code  Diet :  Diet Order            Diet Heart Room service appropriate? Yes; Fluid consistency: Thin  Diet effective now                  Disposition Plan  :   Status is: Inpatient  Remains inpatient appropriate because:Inpatient level of care appropriate due to severity of illness   Dispo: The patient is from: Home              Anticipated d/c is to: Home              Anticipated d/c date is: 3 days              Patient currently is not medically stable to d/c.    Barriers to discharge: UGI bleeding-on IV PPI/complete 3 days of IV Remdesivir  Antimicorbials  :    Anti-infectives (From admission, onward)   Start     Dose/Rate Route Frequency Ordered Stop   01/23/20 1000  remdesivir 100 mg in sodium chloride 0.9 % 100 mL IVPB  Status:  Discontinued        100 mg 200 mL/hr over 30 Minutes Intravenous  Once 01/22/20 1056 01/23/20 0828   01/23/20 1000  remdesivir 100 mg in sodium chloride 0.9 % 100 mL IVPB        100 mg 200 mL/hr over 30 Minutes Intravenous Daily 01/23/20 0828 01/26/20 0959   01/21/20 1000  remdesivir 100 mg in sodium chloride 0.9 % 100 mL IVPB  Status:  Discontinued       "Followed by" Linked Group Details   100 mg 200 mL/hr over 30 Minutes Intravenous Daily 01/20/20 2339 01/23/20 0838   01/20/20 2345  remdesivir 200 mg in sodium chloride 0.9% 250 mL IVPB       "Followed by" Linked Group Details   200 mg 580 mL/hr over 30  Minutes Intravenous Once 01/20/20 2339 01/21/20 0230      Inpatient Medications  Scheduled Meds: . amLODipine  2.5 mg Oral Daily  . atorvastatin  20 mg Oral Daily  . metoprolol succinate  25 mg Oral Daily  . pantoprazole  40 mg Oral BID  . predniSONE  40 mg Oral Q breakfast  . sodium bicarbonate  650 mg Oral BID  . tamsulosin  0.4 mg Oral Daily   Continuous Infusions: . lactated ringers  50 mL/hr at 01/23/20 0852  . remdesivir 100 mg in NS 100 mL 100 mg (01/23/20 0904)   PRN Meds:.acetaminophen **OR** acetaminophen, albuterol, HYDROcodone-acetaminophen   Time Spent in minutes  35    See all Orders from today for further details   Oren Binet M.D on 01/23/2020 at 3:23 PM  To page go to [email or web address] - use universal password  [facility] -  Office  [phone]    Objective:   Vitals:   01/22/20 1452 01/22/20 2100 01/23/20 0422 01/23/20 1520  BP: 139/62 123/65 (!) 155/68 140/77  Pulse: 76 76 90 70  Resp:  20 20 20   Temp: 98.6 F (37 C) 98.6 F (37 C) 98.7 F (37.1 C) 97.8 F (36.6 C)  TempSrc: Oral Oral Oral Oral  SpO2: 97%  99% 96%  Weight: 83.9 kg     Height: 5\' 7"  (1.702 m)       Wt Readings from Last 3 Encounters:  01/22/20 83.9 kg  12/10/19 84.4 kg  11/14/19 82.1 kg     Intake/Output Summary (Last 24 hours) at 01/23/2020 1523 Last data filed at 01/23/2020 0415 Gross per 24 hour  Intake 578.27 ml  Output --  Net 578.27 ml     Physical Exam Gen Exam:Alert awake-not in any distress HEENT:atraumatic, normocephalic Chest: B/L clear to auscultation anteriorly CVS:S1S2 regular Abdomen:soft non tender, non distended Extremities:no edema Neurology: Non focal Skin: no rash   Data Review:    CBC Recent Labs  Lab 01/20/20 1757 01/21/20 0600 01/22/20 0518 01/23/20 0215  WBC 7.6 7.2 7.5 6.4  HGB 8.6* 8.6* 8.2* 8.0*  HCT 30.1* 28.1* 28.3* 27.4*  PLT 153 141* 139* 128*  MCV 87.2 84.6 85.8 84.6  MCH 24.9* 25.9* 24.8* 24.7*  MCHC 28.6* 30.6 29.0* 29.2*  RDW 19.6* 19.4* 19.5* 19.3*  LYMPHSABS  --  0.9 0.7 0.6*  MONOABS  --  0.4 0.5 0.5  EOSABS  --  0.0 0.0 0.0  BASOSABS  --  0.0 0.0 0.0    Chemistries  Recent Labs  Lab 01/20/20 1757 01/21/20 0600 01/22/20 0518 01/23/20 0215  NA 139 141 142 144  K 3.8 3.9 4.0 3.9  CL 108 111 114* 114*  CO2 16* 16* 16* 17*  GLUCOSE 109* 117* 124* 139*  BUN 52* 51* 56* 64*   CREATININE 4.37* 3.95* 3.87* 4.21*  CALCIUM 8.3* 8.4* 8.1* 8.2*  MG  --  2.1 1.9 2.5*  AST 36 36 36 40  ALT 17 18 17 18   ALKPHOS 152* 141* 130* 133*  BILITOT 0.6 0.5 0.8 0.8   ------------------------------------------------------------------------------------------------------------------ No results for input(s): CHOL, HDL, LDLCALC, TRIG, CHOLHDL, LDLDIRECT in the last 72 hours.  No results found for: HGBA1C ------------------------------------------------------------------------------------------------------------------ Recent Labs    01/21/20 0600  TSH 2.159   ------------------------------------------------------------------------------------------------------------------ Recent Labs    01/22/20 0518 01/23/20 0215  FERRITIN 161 143    Coagulation profile No results for input(s): INR, PROTIME in the last 168 hours.  Recent Labs    01/21/20 0000  01/22/20 0518  DDIMER 1.91* 2.00*    Cardiac Enzymes No results for input(s): CKMB, TROPONINI, MYOGLOBIN in the last 168 hours.  Invalid input(s): CK ------------------------------------------------------------------------------------------------------------------    Component Value Date/Time   BNP 881.8 (H) 01/21/2020 0900    Micro Results Recent Results (from the past 240 hour(s))  SARS CORONAVIRUS 2 (TAT 6-24 HRS) Nasopharyngeal Nasopharyngeal Swab     Status: Abnormal   Collection Time: 01/20/20  6:32 PM   Specimen: Nasopharyngeal Swab  Result Value Ref Range Status   SARS Coronavirus 2 POSITIVE (A) NEGATIVE Final    Comment: (NOTE) SARS-CoV-2 target nucleic acids are DETECTED.  The SARS-CoV-2 RNA is generally detectable in upper and lower respiratory specimens during the acute phase of infection. Positive results are indicative of the presence of SARS-CoV-2 RNA. Clinical correlation with patient history and other diagnostic information is  necessary to determine patient infection status. Positive results  do not rule out bacterial infection or co-infection with other viruses.  The expected result is Negative.  Fact Sheet for Patients: SugarRoll.be  Fact Sheet for Healthcare Providers: https://www.woods-mathews.com/  This test is not yet approved or cleared by the [place] FDA and  has been authorized for detection and/or diagnosis of SARS-CoV-2 by FDA under an Emergency Use Authorization (EUA). This EUA will remain  in effect (meaning this test can be used) for the duration of the COVID-19 declaration under Section 564(b)(1) of the Act, 21 U. S.C. section 360bbb-3(b)(1), unless the authorization is terminated or revoked sooner.   Performed at [facility], [place] [address], [place], [place] [address]   Culture, blood (Routine X 2) w Reflex to ID Panel     Status: None (Preliminary result)   Collection Time: 01/21/20  8:28 AM   Specimen: BLOOD RIGHT HAND  Result Value Ref Range Status   Specimen Description BLOOD RIGHT HAND  Final   Special Requests   Final    BOTTLES DRAWN AEROBIC AND ANAEROBIC Blood Culture results may not be optimal due to an inadequate volume of blood received in culture bottles   Culture   Final    NO GROWTH 2 DAYS Performed at [facility], [place] [address] [place], [place] [address]    Report Status PENDING  Incomplete    Radiology Reports DG Chest Port 1 View  Result Date: 01/23/2020 CLINICAL DATA:  Shortness of breath, COVID EXAM: PORTABLE CHEST 1 VIEW COMPARISON:  Radiograph 01/20/2020 FINDINGS: Pacer pack overlies left chest wall with leads in stable position directed towards the apex and right atrium. Stable cardiomediastinal contours with cardiomegaly and a calcified aorta. Postsurgical changes related to prior CABG including intact and aligned sternotomy wires and multiple surgical clips projecting over the mediastinum. Telemetry leads overlie the chest. Increasing mixed patchy airspace and  interstitial opacity throughout the lungs most coalescent in the left mid to lower lung periphery and right lung base. No pneumothorax. Suspect at least small left pleural effusion. Pulmonary vascularity is redistributed and indistinct. No acute osseous or soft tissue abnormality. Degenerative changes are present in the imaged spine and shoulders. IMPRESSION: 1. Increasing mixed patchy airspace and interstitial opacity throughout the lungs most coalescent in the left mid to lower lung periphery and right lung base, concerning for a developing infection, edema or likely some combination there of. Electronically Signed   By: Lovena Le M.D.   On: 01/23/2020 07:02   DG Chest Portable 1 View  Result Date: 01/20/2020 CLINICAL DATA:  Fever cough EXAM: PORTABLE  CHEST 1 VIEW COMPARISON:  04/03/2019 FINDINGS: Post sternotomy changes. Left-sided pacing device as before. Cardiomegaly. Trace right-sided pleural effusion. No focal consolidation or pneumothorax. IMPRESSION: Cardiomegaly with trace right-sided pleural effusion. Electronically Signed   By: Donavan Foil M.D.   On: 01/20/2020 19:50   ECHOCARDIOGRAM COMPLETE  Result Date: 01/08/2020    ECHOCARDIOGRAM REPORT   Patient Name:   ANTRON SETH Date of Exam: 01/08/2020 Medical Rec #:  [id number]      Height:       66.0 in Accession #:    [id number]     Weight:       186.0 lb Date of Birth:  08/31/1940       BSA:          1.939 m Patient Age:    32 years       BP:           152/74 mmHg Patient Gender: M              HR:           60 bpm. 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 CAD, Prior CABG, Abnormal ECG and Pacemaker,                 Arrythmias:Atrial Fibrillation and RBBB,                 Signs/Symptoms:Shortness  of Breath and Chest Pain; Risk                 Factors:Hypertension, Dyslipidemia, Family History of Coronary                 Artery Disease, Former Smoker and Sleep Apnea.  Sonographer:    Deliah Boston RDCS Referring Phys: [place]  1. Left ventricular ejection fraction, by estimation, is 40 to 45%. The left ventricle has mildly decreased function. Left ventricular endocardial border not optimally defined to evaluate regional wall motion. Left ventricular diastolic function could not be evaluated. Elevated left ventricular end-diastolic pressure.  2. Right ventricular systolic function is normal. The right ventricular size is normal. There is mildly elevated pulmonary artery systolic pressure. The estimated right ventricular systolic pressure is 23.7 mmHg.  3. Left atrial size was mildly dilated.  4. The mitral valve is normal in structure. Mild mitral valve regurgitation. No evidence of mitral stenosis.  5. The aortic valve is normal in structure. Aortic valve regurgitation is not visualized. No aortic stenosis is present.  6. Aortic dilatation noted. There is mild dilatation of the aortic root, measuring 39 mm.  7. The inferior vena cava is normal in size with <50% respiratory variability, suggesting right atrial pressure of 8 mmHg.  8. Compared to study of 2018, PASP has decreased from 61 to 37mmHg. FINDINGS  Left Ventricle: Left ventricular ejection fraction, by estimation, is 40 to 45%. The left ventricle has mildly decreased function. Left ventricular endocardial border not optimally defined to evaluate regional wall motion. The left ventricular internal cavity size was normal in size. There is no left ventricular hypertrophy. Abnormal (paradoxical) septal motion, consistent  with RV pacemaker. Left ventricular diastolic function could not be evaluated due to paced rhythm. Left ventricular diastolic function could not be evaluated. Elevated left ventricular end-diastolic pressure.  Right Ventricle: The right ventricular size is normal. No increase in right ventricular wall thickness. Right ventricular systolic function is normal. There is mildly elevated pulmonary artery systolic pressure. The tricuspid regurgitant velocity is 2.88  m/s, and with an assumed right atrial pressure of 8 mmHg, the estimated right ventricular systolic pressure is 78.5 mmHg. Left Atrium: Left atrial size was mildly dilated. Right Atrium: Right atrial size was normal in size. Pericardium: There is no evidence of pericardial effusion. Mitral Valve: The mitral valve is normal in structure. Mild mitral annular calcification. Mild mitral valve regurgitation. No evidence of mitral valve stenosis. Tricuspid Valve: The tricuspid valve is normal in structure. Tricuspid valve regurgitation is mild . No evidence of tricuspid stenosis. Aortic Valve: The aortic valve is normal in structure. Aortic valve regurgitation is not visualized. No aortic stenosis is present. Pulmonic Valve: The pulmonic valve was normal in structure. Pulmonic valve regurgitation is trivial. No evidence of pulmonic stenosis. Aorta: Aortic dilatation noted. There is mild dilatation of the aortic root, measuring 39 mm. Venous: The inferior vena cava is normal in size with less than 50% respiratory variability, suggesting right atrial pressure of 8 mmHg. IAS/Shunts: No atrial level shunt detected by color flow Doppler. Additional Comments: A pacer wire is visualized.  LEFT VENTRICLE PLAX 2D LVIDd:         5.20 cm  Diastology LVIDs:         4.40 cm  LV e' medial:    4.68 cm/s LV PW:         1.10 cm  LV E/e' medial:  21.3 LV IVS:        0.80 cm  LV e' lateral:   6.92 cm/s LVOT diam:     2.20 cm  LV E/e' lateral: 14.4 LV SV:         67 LV SV Index:   34 LVOT Area:     3.80 cm  RIGHT VENTRICLE TAPSE (M-mode): 1.6 cm LEFT ATRIUM             Index       RIGHT ATRIUM           Index LA diam:        4.50 cm 2.32 cm/m  RA Area:     25.80 cm LA Vol (A2C):   68.9 ml  35.53 ml/m RA Volume:   82.70 ml  42.65 ml/m LA Vol (A4C):   64.4 ml 33.21 ml/m LA Biplane Vol: 72.6 ml 37.44 ml/m  AORTIC VALVE LVOT Vmax:   79.20 cm/s LVOT Vmean:  50.500 cm/s LVOT VTI:    0.175 m  AORTA Ao Root diam: 3.90 cm Ao Asc diam:  3.40 cm MITRAL VALVE               TRICUSPID VALVE MV Area (PHT)  cm         TR Peak grad:   33.2 mmHg MV Decel Time: 216 msec    TR Vmax:        288.00 cm/s MR Peak grad: 115.8 mmHg MR Mean grad: 71.0 mmHg    SHUNTS MR Vmax:      538.00 cm/s  Systemic VTI:  0.18 m MR Vmean:     395.0 cm/s   Systemic Diam: 2.20 cm MV E velocity: 99.50 cm/s MV A velocity: 63.40  cm/s MV E/A ratio:  1.57 Fransico Him MD Electronically signed by Fransico Him MD Signature Date/Time: 01/08/2020/1:34:37 PM    Final    CUP PACEART REMOTE DEVICE CHECK  Result Date: 01/23/2020 Scheduled remote reviewed. Normal device function.  1 NSVT lasting 7 beats w/ rate 170's bpm Next remote 91 days. HB

## 2020-01-23 NOTE — Progress Notes (Signed)
Physical Therapy Treatment Patient Details Name: Paul Price MRN: [id number] DOB: 10/20/1940 Today's Date: 01/23/2020    History of Present Illness Pt is an 80 y/o male admitted secondary to dark stools likely from upper GI bleed. Pt also found to be COVID +. PMH includes a fib, DVT, HTN, gout, and OSA on CPAP.    PT Comments    Pt demonstrating progress.  He does have some instability with ambulation - he felt due to not having shoes to wear.  Did advise use of RW at home.  Pt was on RA with sats dropping to 88% with activity but quickly increased to 90% with rest.  Continue POC.      Follow Up Recommendations  Home health PT;Supervision for mobility/OOB     Equipment Recommendations  None recommended by PT    Recommendations for Other Services       Precautions / Restrictions Precautions Precautions: Fall    Mobility  Bed Mobility Overal bed mobility: Needs Assistance Bed Mobility: Supine to Sit     Supine to sit: Supervision        Transfers Overall transfer level: Needs assistance Equipment used: 1 person hand held assist Transfers: Sit to/from Stand Sit to Stand: Supervision         General transfer comment: Performed x 3; supervision for safety  Ambulation/Gait Ambulation/Gait assistance: Min guard;Min assist Gait Distance (Feet): 90 Feet (90'x2) Assistive device: None (occasionally reaching for cabinet) Gait Pattern/deviations: Step-through pattern Gait velocity: decreased   General Gait Details: Mild unsteadiness with 2 LOB to right side requiring min A ; otherwise min guard.  Pt reports not used to walking in socks but does not have shoes here.  Advised to use RW at home initially.   Stairs             Wheelchair Mobility    Modified Rankin (Stroke Patients Only)       Balance Overall balance assessment: Needs assistance Sitting-balance support: No upper extremity supported;Feet supported Sitting balance-[name] Scale: Good      Standing balance support: No upper extremity supported;Single extremity supported Standing balance-[name] Scale: Fair Standing balance comment: Able to maintain static standing without UE support; ambulated with occasional UE support                            Cognition Arousal/Alertness: Awake/alert Behavior During Therapy: WFL for tasks assessed/performed Overall Cognitive Status: Within Functional Limits for tasks assessed                                 General Comments: Pt at times required repetition and with occasional odd response - however possibly due to [facility].  He did state he saw ants moving across floor (there were no ants).  Notified RN.      Exercises      General Comments General comments (skin integrity, edema, etc.): Pt on RA with sats 92% rest; 88% activity but improved to 90% in 30 seconds of rest.      Pertinent Vitals/Pain Pain Assessment: No/denies pain    Home Living                      Prior Function            PT Goals (current goals can now be found in the care plan section) Acute Rehab PT Goals Patient  Stated Goal: to go home PT Goal Formulation: With patient Time For Goal Achievement: 02/05/20 Potential to Achieve Goals: Good Progress towards PT goals: Progressing toward goals    Frequency    Min 3X/week      PT Plan Current plan remains appropriate    Co-evaluation              AM-PAC PT "6 Clicks" Mobility   Outcome Measure  Help needed turning from your back to your side while in a flat bed without using bedrails?: None Help needed moving from lying on your back to sitting on the side of a flat bed without using bedrails?: None Help needed moving to and from a bed to a chair (including a wheelchair)?: A Little Help needed standing up from a chair using your arms (e.g., wheelchair or bedside chair)?: A Little Help needed to walk in hospital room?: A Little Help needed climbing 3-5 steps  with a railing? : A Little 6 Click Score: 20    End of Session Equipment Utilized During Treatment: Gait belt Activity Tolerance: Patient tolerated treatment well Patient left: with chair alarm set;in chair;with call bell/phone within reach Nurse Communication: Mobility status PT Visit Diagnosis: Unsteadiness on feet (R26.81);Muscle weakness (generalized) (M62.81)     Time: 0881-1031 PT Time Calculation (min) (ACUTE ONLY): 23 min  Charges:  $Gait Training: 8-22 mins $Therapeutic Activity: 8-22 mins                     Abran Richard, PT Acute Rehab Services Pager [phone] [facility] 01/23/2020, 3:23 PM

## 2020-01-24 ENCOUNTER — Inpatient Hospital Stay (HOSPITAL_COMMUNITY): Payer: Medicare Other

## 2020-01-24 ENCOUNTER — Other Ambulatory Visit (HOSPITAL_COMMUNITY): Payer: Medicare Other

## 2020-01-24 DIAGNOSIS — K922 Gastrointestinal hemorrhage, unspecified: Secondary | ICD-10-CM | POA: Diagnosis not present

## 2020-01-24 DIAGNOSIS — U071 COVID-19: Secondary | ICD-10-CM | POA: Diagnosis not present

## 2020-01-24 DIAGNOSIS — N179 Acute kidney failure, unspecified: Secondary | ICD-10-CM | POA: Diagnosis not present

## 2020-01-24 DIAGNOSIS — G4733 Obstructive sleep apnea (adult) (pediatric): Secondary | ICD-10-CM | POA: Diagnosis not present

## 2020-01-24 LAB — IRON AND TIBC
Iron: 11 ug/dL — ABNORMAL LOW (ref 45–182)
Saturation Ratios: 5 % — ABNORMAL LOW (ref 17.9–39.5)
TIBC: 238 ug/dL — ABNORMAL LOW (ref 250–450)
UIBC: 227 ug/dL

## 2020-01-24 LAB — CBC WITH DIFFERENTIAL/PLATELET
Abs Immature Granulocytes: 0.05 10*3/uL (ref 0.00–0.07)
Basophils Absolute: 0 10*3/uL (ref 0.0–0.1)
Basophils Relative: 0 %
Eosinophils Absolute: 0 10*3/uL (ref 0.0–0.5)
Eosinophils Relative: 0 %
HCT: 26.6 % — ABNORMAL LOW (ref 39.0–52.0)
Hemoglobin: 8.2 g/dL — ABNORMAL LOW (ref 13.0–17.0)
Immature Granulocytes: 1 %
Lymphocytes Relative: 8 %
Lymphs Abs: 0.4 10*3/uL — ABNORMAL LOW (ref 0.7–4.0)
MCH: 25.5 pg — ABNORMAL LOW (ref 26.0–34.0)
MCHC: 30.8 g/dL (ref 30.0–36.0)
MCV: 82.9 fL (ref 80.0–100.0)
Monocytes Absolute: 0.1 10*3/uL (ref 0.1–1.0)
Monocytes Relative: 2 %
Neutro Abs: 4.2 10*3/uL (ref 1.7–7.7)
Neutrophils Relative %: 89 %
Platelets: 128 10*3/uL — ABNORMAL LOW (ref 150–400)
RBC: 3.21 MIL/uL — ABNORMAL LOW (ref 4.22–5.81)
RDW: 19.4 % — ABNORMAL HIGH (ref 11.5–15.5)
WBC: 4.8 10*3/uL (ref 4.0–10.5)
nRBC: 0 % (ref 0.0–0.2)

## 2020-01-24 LAB — COMPREHENSIVE METABOLIC PANEL
ALT: 18 U/L (ref 0–44)
AST: 38 U/L (ref 15–41)
Albumin: 2.5 g/dL — ABNORMAL LOW (ref 3.5–5.0)
Alkaline Phosphatase: 126 U/L (ref 38–126)
Anion gap: 11 (ref 5–15)
BUN: 69 mg/dL — ABNORMAL HIGH (ref 8–23)
CO2: 18 mmol/L — ABNORMAL LOW (ref 22–32)
Calcium: 8.7 mg/dL — ABNORMAL LOW (ref 8.9–10.3)
Chloride: 117 mmol/L — ABNORMAL HIGH (ref 98–111)
Creatinine, Ser: 3.96 mg/dL — ABNORMAL HIGH (ref 0.61–1.24)
GFR, Estimated: 14 mL/min — ABNORMAL LOW (ref >60)
Glucose, Bld: 171 mg/dL — ABNORMAL HIGH (ref 70–99)
Potassium: 4.7 mmol/L (ref 3.5–5.1)
Sodium: 146 mmol/L — ABNORMAL HIGH (ref 135–145)
Total Bilirubin: 0.8 mg/dL (ref 0.3–1.2)
Total Protein: 5.7 g/dL — ABNORMAL LOW (ref 6.5–8.1)

## 2020-01-24 LAB — MAGNESIUM: Magnesium: 2.5 mg/dL — ABNORMAL HIGH (ref 1.7–2.4)

## 2020-01-24 LAB — FERRITIN: Ferritin: 142 ng/mL (ref 24–336)

## 2020-01-24 LAB — C-REACTIVE PROTEIN: CRP: 13.4 mg/dL — ABNORMAL HIGH (ref <1.0)

## 2020-01-24 MED ORDER — SODIUM CHLORIDE 0.9 % IV SOLN: 510.0000 mg | INTRAVENOUS | Status: DC

## 2020-01-24 MED ORDER — LACTATED RINGERS IV BOLUS: 500.0000 mL | Freq: Once | INTRAVENOUS | Status: DC

## 2020-01-24 MED ORDER — SODIUM CHLORIDE 0.45 % IV SOLN: INTRAVENOUS | Status: AC

## 2020-01-24 NOTE — Progress Notes (Signed)
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 Likely secondary to pre-renal injury. Agree with fluids for today, diuretics on hold -no indications for renal replacement therapy at the moment. Discussions are in place between him and Dr. Marval Regal (primary nephrologist). 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 B/L cr around 3  -UA w/ sediment exam -renal u/s w/o obstruction however now with a possible left RCC -Continue to monitor daily Cr, Dose meds for GFR<15 -Monitor Daily I/Os, Daily weight  -Maintain MAP>65 for optimal renal perfusion.  -Agree with holding ACE-I, avoid further nephrotoxins including NSAIDS, Morphine.  Unless absolutely necessary, avoid CT with contrast and/or MRI with gadolinium.   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 Increased in size since prior study  -outpatient follow up, nothing to do about this at the moment. Will need this worked up extensively in the context of him already having a nephrectomy in the past  Hypertension: -resume home meds, stable BP  UGIB -resolved, a/c on hold  COVID-19 PNA -steroids, remdesivir per primary service  Metabolic acidosis, +AG -corrected AG for albumin: ~16. Likely related to advanced ckd/aki, increased nahco3 to 1300mg  tid  Anemia due to chronic kidney disease and blood loss: -Transfuse for Hgb<7 g/dL -iron panel: iron deficient. hesitant on giving iron and/or ESA given active treatment for covid and possible RCC respectively  Hypoalbuminemia -push protein  Recommendations conveyed to primary service.    Subjective:   Due to the COVID pandemic, in attempts to limit transmission and over-utilization of resources (e.g. PPE), the patient was seen virtually today by means of chart review, discussion with staff, and virtual discussion with  patient as needed.  No acute events. Cr better, down to 4. uop not measured?   Objective:   BP (!) 148/72 (BP Location: Right Arm)   Pulse 76   Temp 97.9 F (36.6 C)   Resp 20   Ht 5\' 7"  (1.702 m)   Wt 83.9 kg   SpO2 95%   BMI 28.98 kg/m   Intake/Output Summary (Last 24 hours) at 01/24/2020 1321 Last data filed at 01/23/2020 2126 Gross per 24 hour  Intake 442.58 ml  Output --  Net 442.58 ml   Weight change:   Physical Exam: Virtual visit, no exam.  Imaging: US RENAL  Result Date: 01/24/2020 CLINICAL DATA:  Acute kidney injury. Status post right-sided nephrectomy. EXAM: RENAL / URINARY TRACT ULTRASOUND COMPLETE COMPARISON:  CT dated 02/26/2019 FINDINGS: Right Kidney: Surgically absent Left Kidney: Renal measurements: 12.6 x 5.7 x 3.9 cm = volume: 148 mL. There is no hydronephrosis. The left kidney is echogenic. Small cysts are noted arising from the lower pole. Arising from the lower pole the left kidney is an indeterminate exophytic 2 x 1.4 cm nodule. This is not clearly cystic on this study. Additionally, this lesion appears to have grown since the prior CT. Bladder: Appears normal for degree of bladder distention. Other: None. IMPRESSION: 1. Status post right-sided nephrectomy. 2. No left-sided hydronephrosis. 3. Echogenic left kidney which can be seen in patients with medical renal disease. 4. Indeterminate, solid-appearing nodule rising from the lower pole the left kidney is concerning for renal cell carcinoma. This nodule appears to have increased in size since the patient's prior study given differences in modality. Electronically Signed   By: Constance Holster M.D.   On:  01/24/2020 04:02   DG Chest Port 1 View  Result Date: 01/23/2020 CLINICAL DATA:  Shortness of breath, COVID EXAM: PORTABLE CHEST 1 VIEW COMPARISON:  Radiograph 01/20/2020 FINDINGS: Pacer pack overlies left chest wall with leads in stable position directed towards the apex and right atrium. Stable  cardiomediastinal contours with cardiomegaly and a calcified aorta. Postsurgical changes related to prior CABG including intact and aligned sternotomy wires and multiple surgical clips projecting over the mediastinum. Telemetry leads overlie the chest. Increasing mixed patchy airspace and interstitial opacity throughout the lungs most coalescent in the left mid to lower lung periphery and right lung base. No pneumothorax. Suspect at least small left pleural effusion. Pulmonary vascularity is redistributed and indistinct. No acute osseous or soft tissue abnormality. Degenerative changes are present in the imaged spine and shoulders. IMPRESSION: 1. Increasing mixed patchy airspace and interstitial opacity throughout the lungs most coalescent in the left mid to lower lung periphery and right lung base, concerning for a developing infection, edema or likely some combination there of. Electronically Signed   By: Lovena Le M.D.   On: 01/23/2020 07:02   CUP PACEART REMOTE DEVICE CHECK  Result Date: 01/23/2020 Scheduled remote reviewed. Normal device function.  1 NSVT lasting 7 beats w/ rate 170's bpm Next remote 91 days. HB   Labs: BMET Recent Labs  Lab 01/20/20 1757 01/21/20 0600 01/22/20 0518 01/23/20 0215 01/24/20 0235  NA 139 141 142 144 146*  K 3.8 3.9 4.0 3.9 4.7  CL 108 111 114* 114* 117*  CO2 16* 16* 16* 17* 18*  GLUCOSE 109* 117* 124* 139* 171*  BUN 52* 51* 56* 64* 69*  CREATININE 4.37* 3.95* 3.87* 4.21* 3.96*  CALCIUM 8.3* 8.4* 8.1* 8.2* 8.7*  PHOS  --  4.3 4.4  --   --    CBC Recent Labs  Lab 01/21/20 0600 01/22/20 0518 01/23/20 0215 01/24/20 0235  WBC 7.2 7.5 6.4 4.8  NEUTROABS 5.8 6.3 5.1 4.2  HGB 8.6* 8.2* 8.0* 8.2*  HCT 28.1* 28.3* 27.4* 26.6*  MCV 84.6 85.8 84.6 82.9  PLT 141* 139* 128* 128*    Medications:    . amLODipine  2.5 mg Oral Daily  . atorvastatin  20 mg Oral Daily  . metoprolol succinate  25 mg Oral Daily  . pantoprazole  40 mg Oral BID  .  predniSONE  40 mg Oral Q breakfast  . sodium bicarbonate  1,300 mg Oral TID  . tamsulosin  0.4 mg Oral Daily      Gean Quint, MD [facility] 01/24/2020, 1:21 PM

## 2020-01-24 NOTE — Progress Notes (Addendum)
PROGRESS NOTE                                                                                                                                                                                                             Patient Demographics:    Paul Price, is a 80 y.o. male, DOB - 29-Aug-1940, HQP:[id number]  Outpatient Primary MD for the patient is [name], MD   Admit date - 01/20/2020   LOS - 4  No chief complaint on file.      Brief Narrative: Patient is a 80 y.o. male with PMHx of PAF on Xarelto, secondary AV block-s/p PPM placement, chronic systolic heart failure, CAD s/p CABG, stage V CKD-prior history of GI bleeding-presenting with 4-day history of fever, and diarrhea, and black stools.  Found to have upper GI bleeding-and COVID-19 infection.  See below for further details.   COVID-19 vaccinated status: Vaccinated-but not boosted.  Significant Events: 1/11>> Admit to [facility] for UGI bleeding and COVID-19 infection.  Significant studies: 1/11>>Chest x-ray: No focal consolidation/pneumothorax 1/14>> chest x-ray: Increasing mixed patchy airspace/interstitial opacity throughout the lungs 1/15>> renal ultrasound: S/p right nephrectomy, solid appearing nodule arising from the lower pole of left kidney-concerning for RCC.  Nodule seems to have increased in size compared to prior study.  COVID-19 medications: Remdesivir: 1/11>>  Antibiotics: None  Microbiology data: 1/12>>Blood culture: no growth  Procedures: None  Consults: Nephrology  DVT prophylaxis: SCDs Start: 01/20/20 2203    Subjective:   Feeling better-no hematochezia/melena for almost 2 days.  Cough is decreased-remains on room air.Per son-multiple family members are now sick with covid at home   Assessment  & Plan :   Upper GI bleeding: Has had prior episodes in the past-recent EGD/capsule study without any obvious abnormalities.  No further  episodes of hematochezia-hemoglobin relatively stable-diet has been advanced-PPI changed to oral route.  GI has signed off on 1/13.  Anticoagulation remains on hold-per GI note on 1/12-recommendations are to hold for 1 week on discharge.  COVID-19 pneumonia: Although not hypoxic-had significant coughing spells that has gradually improved.  Initial chest x-ray without pneumonia-but repeat chest x-ray shows significant infiltrates.  He appears clinically improved-we will complete Remdesivir on 1/16, started steroids yesterday-CRP still elevated but now downtrending.  Continue supportive care.    Fever: afebrile this am O2 requirements:  SpO2: 95 %  COVID-19 Labs: Recent Labs    01/22/20 0518 01/23/20 0215 01/24/20 0235  DDIMER 2.00*  --   --   FERRITIN 161 143 142  CRP 12.7* 14.1* 13.4*       Component Value Date/Time   BNP 881.8 (H) 01/21/2020 0900    Recent Labs  Lab 01/21/20 0600 01/23/20 0215  PROCALCITON 1.01 0.93    Lab Results  Component Value Date   SARSCOV2NAA POSITIVE (A) 01/20/2020   SARSCOV2NAA NEGATIVE 11/14/2019   [place] NEGATIVE 05/17/2019   [place] NEGATIVE 03/27/2019     Normocytic anemia: Secondary to chronic kidney disease-required 1 unit of PRBC on 1/6 as an outpatient.  Hemoglobin relatively stable-no evidence of significant blood loss at this point.  Follow closely.  AKI on CKD stage V: AKI hemodynamically mediated-creatinine had improved-however back up again.  We will gently hydrate for a few hours-repeat electrolytes tomorrow.  Have consulted nephrology.    History of solitary left kidney-s/p right nephrectomy for renal cell carcinoma  Nodule left kidney (possible renal cell cancer): See renal ultrasound above-patient will need outpatient follow-up with his primary urologist for further continued care.  Nongap metabolic acidosis: Secondary to AKI/CKD-continue oral bicarbonate-follow for now.  PAF: Paced rhythm-resume metoprolol-hold  Xarelto due to GI bleeding.  Chronic systolic heart failure (EF 40-45% by echo on 01/08/2020): Compensated-resume diuretics over the next few days once renal function improves further.  History of second-degree AV block-s/p PPM placement  CAD s/p CABG-92-s/p DES/12: No anginal symptoms   HTN: BP better controlled-on amlodipine and metoprolol.  COPD: Stable-continue as needed inhalers  OSA: On CPAP  BPH: On Flomax  ABG:    Component Value Date/Time   TCO2 18 (L) 09/06/2016 1418    Vent Settings: N/A    Condition - Stable  Family Communication  :  Son-[phone] left a voicemail on 1/15  Code Status :  Full Code  Diet :  Diet Order            Diet Heart Room service appropriate? Yes; Fluid consistency: Thin  Diet effective now                  Disposition Plan  :   Status is: Inpatient  Remains inpatient appropriate because:Inpatient level of care appropriate due to severity of illness   Dispo: The patient is from: Home              Anticipated d/c is to: Home              Anticipated d/c date is: 1 days              Patient currently is not medically stable to d/c.    Barriers to discharge: UGI bleeding-on IV PPI/complete 3 days of IV Remdesivir  Antimicorbials  :    Anti-infectives (From admission, onward)   Start     Dose/Rate Route Frequency Ordered Stop   01/23/20 1000  remdesivir 100 mg in sodium chloride 0.9 % 100 mL IVPB  Status:  Discontinued        100 mg 200 mL/hr over 30 Minutes Intravenous  Once 01/22/20 1056 01/23/20 0828   01/23/20 1000  remdesivir 100 mg in sodium chloride 0.9 % 100 mL IVPB        100 mg 200 mL/hr over 30 Minutes Intravenous Daily 01/23/20 0828 01/26/20 0959   01/21/20 1000  remdesivir 100 mg in sodium chloride 0.9 % 100 mL IVPB  Status:  Discontinued       "  Followed by" Linked Group Details   100 mg 200 mL/hr over 30 Minutes Intravenous Daily 01/20/20 2339 01/23/20 0838   01/20/20 2345  remdesivir 200 mg in  sodium chloride 0.9% 250 mL IVPB       "Followed by" Linked Group Details   200 mg 580 mL/hr over 30 Minutes Intravenous Once 01/20/20 2339 01/21/20 0230      Inpatient Medications  Scheduled Meds: . amLODipine  2.5 mg Oral Daily  . atorvastatin  20 mg Oral Daily  . metoprolol succinate  25 mg Oral Daily  . pantoprazole  40 mg Oral BID  . predniSONE  40 mg Oral Q breakfast  . sodium bicarbonate  1,300 mg Oral TID  . tamsulosin  0.4 mg Oral Daily   Continuous Infusions: . remdesivir 100 mg in NS 100 mL 100 mg (01/24/20 0800)   PRN Meds:.acetaminophen **OR** acetaminophen, albuterol, HYDROcodone-acetaminophen   Time Spent in minutes  25    See all Orders from today for further details   Oren Binet M.D on 01/24/2020 at 10:53 AM  To page go to [email or web address] - use universal password  [facility] -  Office  [phone]    Objective:   Vitals:   01/23/20 0422 01/23/20 1520 01/23/20 2125 01/24/20 0539  BP: (!) 155/68 140/77 137/66 (!) 148/72  Pulse: 90 70 72 76  Resp: 20 20 18 20   Temp: 98.7 F (37.1 C) 97.8 F (36.6 C) 98.1 F (36.7 C) 97.9 F (36.6 C)  TempSrc: Oral Oral Oral   SpO2: 99% 96% 94% 95%  Weight:      Height:        Wt Readings from Last 3 Encounters:  01/22/20 83.9 kg  12/10/19 84.4 kg  11/14/19 82.1 kg     Intake/Output Summary (Last 24 hours) at 01/24/2020 1053 Last data filed at 01/23/2020 2126 Gross per 24 hour  Intake 442.58 ml  Output --  Net 442.58 ml     Physical Exam Gen Exam:Alert awake-not in any distress HEENT:atraumatic, normocephalic Chest: B/L clear to auscultation anteriorly CVS:S1S2 regular Abdomen:soft non tender, non distended Extremities:no edema Neurology: Non focal Skin: no rash   Data Review:    CBC Recent Labs  Lab 01/20/20 1757 01/21/20 0600 01/22/20 0518 01/23/20 0215 01/24/20 0235  WBC 7.6 7.2 7.5 6.4 4.8  HGB 8.6* 8.6* 8.2* 8.0* 8.2*  HCT 30.1* 28.1* 28.3* 27.4* 26.6*  PLT 153  141* 139* 128* 128*  MCV 87.2 84.6 85.8 84.6 82.9  MCH 24.9* 25.9* 24.8* 24.7* 25.5*  MCHC 28.6* 30.6 29.0* 29.2* 30.8  RDW 19.6* 19.4* 19.5* 19.3* 19.4*  LYMPHSABS  --  0.9 0.7 0.6* 0.4*  MONOABS  --  0.4 0.5 0.5 0.1  EOSABS  --  0.0 0.0 0.0 0.0  BASOSABS  --  0.0 0.0 0.0 0.0    Chemistries  Recent Labs  Lab 01/20/20 1757 01/21/20 0600 01/22/20 0518 01/23/20 0215 01/24/20 0235  NA 139 141 142 144 146*  K 3.8 3.9 4.0 3.9 4.7  CL 108 111 114* 114* 117*  CO2 16* 16* 16* 17* 18*  GLUCOSE 109* 117* 124* 139* 171*  BUN 52* 51* 56* 64* 69*  CREATININE 4.37* 3.95* 3.87* 4.21* 3.96*  CALCIUM 8.3* 8.4* 8.1* 8.2* 8.7*  MG  --  2.1 1.9 2.5* 2.5*  AST 36 36 36 40 38  ALT 17 18 17 18 18   ALKPHOS 152* 141* 130* 133* 126  BILITOT 0.6 0.5 0.8 0.8 0.8   ------------------------------------------------------------------------------------------------------------------ No  results for input(s): CHOL, HDL, LDLCALC, TRIG, CHOLHDL, LDLDIRECT in the last 72 hours.  No results found for: HGBA1C ------------------------------------------------------------------------------------------------------------------ No results for input(s): TSH, T4TOTAL, T3FREE, THYROIDAB in the last 72 hours.  Invalid input(s): FREET3 ------------------------------------------------------------------------------------------------------------------ Recent Labs    01/23/20 0215 01/24/20 0235  FERRITIN 143 142  TIBC  --  238*  IRON  --  11*    Coagulation profile No results for input(s): INR, PROTIME in the last 168 hours.  Recent Labs    01/22/20 0518  DDIMER 2.00*    Cardiac Enzymes No results for input(s): CKMB, TROPONINI, MYOGLOBIN in the last 168 hours.  Invalid input(s): CK ------------------------------------------------------------------------------------------------------------------    Component Value Date/Time   BNP 881.8 (H) 01/21/2020 0900    Micro Results Recent Results (from the past  240 hour(s))  SARS CORONAVIRUS 2 (TAT 6-24 HRS) Nasopharyngeal Nasopharyngeal Swab     Status: Abnormal   Collection Time: 01/20/20  6:32 PM   Specimen: Nasopharyngeal Swab  Result Value Ref Range Status   SARS Coronavirus 2 POSITIVE (A) NEGATIVE Final    Comment: (NOTE) SARS-CoV-2 target nucleic acids are DETECTED.  The SARS-CoV-2 RNA is generally detectable in upper and lower respiratory specimens during the acute phase of infection. Positive results are indicative of the presence of SARS-CoV-2 RNA. Clinical correlation with patient history and other diagnostic information is  necessary to determine patient infection status. Positive results do not rule out bacterial infection or co-infection with other viruses.  The expected result is Negative.  Fact Sheet for Patients: SugarRoll.be  Fact Sheet for Healthcare Providers: https://www.woods-mathews.com/  This test is not yet approved or cleared by the [place] FDA and  has been authorized for detection and/or diagnosis of SARS-CoV-2 by FDA under an Emergency Use Authorization (EUA). This EUA will remain  in effect (meaning this test can be used) for the duration of the COVID-19 declaration under Section 564(b)(1) of the Act, 21 U. S.C. section 360bbb-3(b)(1), unless the authorization is terminated or revoked sooner.   Performed at [facility], [place] [address] [place], [place] [address]   Culture, blood (Routine X 2) w Reflex to ID Panel     Status: None (Preliminary result)   Collection Time: 01/21/20  8:28 AM   Specimen: BLOOD RIGHT HAND  Result Value Ref Range Status   Specimen Description BLOOD RIGHT HAND  Final   Special Requests   Final    BOTTLES DRAWN AEROBIC AND ANAEROBIC Blood Culture results may not be optimal due to an inadequate volume of blood received in culture bottles   Culture   Final    NO GROWTH 2 DAYS Performed at [facility], [place] [address], [place], [place] [address]    Report Status PENDING  Incomplete    Radiology Reports US RENAL  Result Date: 01/24/2020 CLINICAL DATA:  Acute kidney injury. Status post right-sided nephrectomy. EXAM: RENAL / URINARY TRACT ULTRASOUND COMPLETE COMPARISON:  CT dated 02/26/2019 FINDINGS: Right Kidney: Surgically absent Left Kidney: Renal measurements: 12.6 x 5.7 x 3.9 cm = volume: 148 mL. There is no hydronephrosis. The left kidney is echogenic. Small cysts are noted arising from the lower pole. Arising from the lower pole the left kidney is an indeterminate exophytic 2 x 1.4 cm nodule. This is not clearly cystic on this study. Additionally, this lesion appears to have grown since the prior CT. Bladder: Appears normal for degree of bladder distention. Other: None. IMPRESSION: 1. Status post right-sided nephrectomy. 2. No  left-sided hydronephrosis. 3. Echogenic left kidney which can be seen in patients with medical renal disease. 4. Indeterminate, solid-appearing nodule rising from the lower pole the left kidney is concerning for renal cell carcinoma. This nodule appears to have increased in size since the patient's prior study given differences in modality. Electronically Signed   By: Constance Holster M.D.   On: 01/24/2020 04:02   DG Chest Port 1 View  Result Date: 01/23/2020 CLINICAL DATA:  Shortness of breath, COVID EXAM: PORTABLE CHEST 1 VIEW COMPARISON:  Radiograph 01/20/2020 FINDINGS: Pacer pack overlies left chest wall with leads in stable position directed towards the apex and right atrium. Stable cardiomediastinal contours with cardiomegaly and a calcified aorta. Postsurgical changes related to prior CABG including intact and aligned sternotomy wires and multiple surgical clips projecting over the mediastinum. Telemetry leads overlie the chest. Increasing mixed patchy airspace and interstitial opacity throughout the lungs most coalescent in the left mid to lower lung periphery and right lung base. No  pneumothorax. Suspect at least small left pleural effusion. Pulmonary vascularity is redistributed and indistinct. No acute osseous or soft tissue abnormality. Degenerative changes are present in the imaged spine and shoulders. IMPRESSION: 1. Increasing mixed patchy airspace and interstitial opacity throughout the lungs most coalescent in the left mid to lower lung periphery and right lung base, concerning for a developing infection, edema or likely some combination there of. Electronically Signed   By: Lovena Le M.D.   On: 01/23/2020 07:02   DG Chest Portable 1 View  Result Date: 01/20/2020 CLINICAL DATA:  Fever cough EXAM: PORTABLE CHEST 1 VIEW COMPARISON:  04/03/2019 FINDINGS: Post sternotomy changes. Left-sided pacing device as before. Cardiomegaly. Trace right-sided pleural effusion. No focal consolidation or pneumothorax. IMPRESSION: Cardiomegaly with trace right-sided pleural effusion. Electronically Signed   By: Donavan Foil M.D.   On: 01/20/2020 19:50   ECHOCARDIOGRAM COMPLETE  Result Date: 01/08/2020    ECHOCARDIOGRAM REPORT   Patient Name:   REVAN GENDRON Date of Exam: 01/08/2020 Medical Rec #:  [id number]      Height:       66.0 in Accession #:    [id number]     Weight:       186.0 lb Date of Birth:  1940/07/29       BSA:          1.939 m Patient Age:    1 years       BP:           152/74 mmHg Patient Gender: M              HR:           60 bpm. 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 CAD, Prior CABG, Abnormal ECG and Pacemaker,                 Arrythmias:Atrial Fibrillation and RBBB,                 Signs/Symptoms:Shortness of Breath and Chest Pain; Risk  Factors:Hypertension, Dyslipidemia, Family History of Coronary                  Artery Disease, Former Smoker and Sleep Apnea.  Sonographer:    Deliah Boston RDCS Referring Phys: Gerald  1. Left ventricular ejection fraction, by estimation, is 40 to 45%. The left ventricle has mildly decreased function. Left ventricular endocardial border not optimally defined to evaluate regional wall motion. Left ventricular diastolic function could not be evaluated. Elevated left ventricular end-diastolic pressure.  2. Right ventricular systolic function is normal. The right ventricular size is normal. There is mildly elevated pulmonary artery systolic pressure. The estimated right ventricular systolic pressure is 38.4 mmHg.  3. Left atrial size was mildly dilated.  4. The mitral valve is normal in structure. Mild mitral valve regurgitation. No evidence of mitral stenosis.  5. The aortic valve is normal in structure. Aortic valve regurgitation is not visualized. No aortic stenosis is present.  6. Aortic dilatation noted. There is mild dilatation of the aortic root, measuring 39 mm.  7. The inferior vena cava is normal in size with <50% respiratory variability, suggesting right atrial pressure of 8 mmHg.  8. Compared to study of 2018, PASP has decreased from 61 to 31mmHg. FINDINGS  Left Ventricle: Left ventricular ejection fraction, by estimation, is 40 to 45%. The left ventricle has mildly decreased function. Left ventricular endocardial border not optimally defined to evaluate regional wall motion. The left ventricular internal cavity size was normal in size. There is no left ventricular hypertrophy. Abnormal (paradoxical) septal motion, consistent with RV pacemaker. Left ventricular diastolic function could not be evaluated due to paced rhythm. Left ventricular diastolic function could not be evaluated. Elevated left ventricular end-diastolic pressure. Right Ventricle: The right ventricular size is normal. No increase in right ventricular wall thickness. Right ventricular  systolic function is normal. There is mildly elevated pulmonary artery systolic pressure. The tricuspid regurgitant velocity is 2.88  m/s, and with an assumed right atrial pressure of 8 mmHg, the estimated right ventricular systolic pressure is 66.5 mmHg. Left Atrium: Left atrial size was mildly dilated. Right Atrium: Right atrial size was normal in size. Pericardium: There is no evidence of pericardial effusion. Mitral Valve: The mitral valve is normal in structure. Mild mitral annular calcification. Mild mitral valve regurgitation. No evidence of mitral valve stenosis. Tricuspid Valve: The tricuspid valve is normal in structure. Tricuspid valve regurgitation is mild . No evidence of tricuspid stenosis. Aortic Valve: The aortic valve is normal in structure. Aortic valve regurgitation is not visualized. No aortic stenosis is present. Pulmonic Valve: The pulmonic valve was normal in structure. Pulmonic valve regurgitation is trivial. No evidence of pulmonic stenosis. Aorta: Aortic dilatation noted. There is mild dilatation of the aortic root, measuring 39 mm. Venous: The inferior vena cava is normal in size with less than 50% respiratory variability, suggesting right atrial pressure of 8 mmHg. IAS/Shunts: No atrial level shunt detected by color flow Doppler. Additional Comments: A pacer wire is visualized.  LEFT VENTRICLE PLAX 2D LVIDd:         5.20 cm  Diastology LVIDs:         4.40 cm  LV e' medial:    4.68 cm/s LV PW:         1.10 cm  LV E/e' medial:  21.3 LV IVS:        0.80 cm  LV e' lateral:   6.92 cm/s LVOT diam:     2.20 cm  LV E/e' lateral: 14.4 LV SV:         67 LV SV Index:   34 LVOT Area:     3.80 cm  RIGHT VENTRICLE TAPSE (M-mode): 1.6 cm LEFT ATRIUM             Index       RIGHT ATRIUM           Index LA diam:        4.50 cm 2.32 cm/m  RA Area:     25.80 cm LA Vol (A2C):   68.9 ml 35.53 ml/m RA Volume:   82.70 ml  42.65 ml/m LA Vol (A4C):   64.4 ml 33.21 ml/m LA Biplane Vol: 72.6 ml 37.44 ml/m   AORTIC VALVE LVOT Vmax:   79.20 cm/s LVOT Vmean:  50.500 cm/s LVOT VTI:    0.175 m  AORTA Ao Root diam: 3.90 cm Ao Asc diam:  3.40 cm MITRAL VALVE               TRICUSPID VALVE MV Area (PHT)  cm         TR Peak grad:   33.2 mmHg MV Decel Time: 216 msec    TR Vmax:        288.00 cm/s MR Peak grad: 115.8 mmHg MR Mean grad: 71.0 mmHg    SHUNTS MR Vmax:      538.00 cm/s  Systemic VTI:  0.18 m MR Vmean:     395.0 cm/s   Systemic Diam: 2.20 cm MV E velocity: 99.50 cm/s MV A velocity: 63.40 cm/s MV E/A ratio:  1.57 Fransico Him MD Electronically signed by Fransico Him MD Signature Date/Time: 01/08/2020/1:34:37 PM    Final    CUP PACEART REMOTE DEVICE CHECK  Result Date: 01/23/2020 Scheduled remote reviewed. Normal device function.  1 NSVT lasting 7 beats w/ rate 170's bpm Next remote 91 days. HB

## 2020-01-24 NOTE — Progress Notes (Signed)
RT NOTE: ? ?Pt refuses CPAP.  ?

## 2020-01-25 DIAGNOSIS — U071 COVID-19: Secondary | ICD-10-CM | POA: Diagnosis not present

## 2020-01-25 LAB — MAGNESIUM: Magnesium: 2.4 mg/dL (ref 1.7–2.4)

## 2020-01-25 LAB — CBC WITH DIFFERENTIAL/PLATELET
Abs Immature Granulocytes: 0.06 10*3/uL (ref 0.00–0.07)
Basophils Absolute: 0 10*3/uL (ref 0.0–0.1)
Basophils Relative: 0 %
Eosinophils Absolute: 0 10*3/uL (ref 0.0–0.5)
Eosinophils Relative: 0 %
HCT: 24.6 % — ABNORMAL LOW (ref 39.0–52.0)
Hemoglobin: 7.6 g/dL — ABNORMAL LOW (ref 13.0–17.0)
Immature Granulocytes: 1 %
Lymphocytes Relative: 5 %
Lymphs Abs: 0.4 10*3/uL — ABNORMAL LOW (ref 0.7–4.0)
MCH: 25.5 pg — ABNORMAL LOW (ref 26.0–34.0)
MCHC: 30.9 g/dL (ref 30.0–36.0)
MCV: 82.6 fL (ref 80.0–100.0)
Monocytes Absolute: 0.4 10*3/uL (ref 0.1–1.0)
Monocytes Relative: 5 %
Neutro Abs: 7.2 10*3/uL (ref 1.7–7.7)
Neutrophils Relative %: 89 %
Platelets: 144 10*3/uL — ABNORMAL LOW (ref 150–400)
RBC: 2.98 MIL/uL — ABNORMAL LOW (ref 4.22–5.81)
RDW: 19.5 % — ABNORMAL HIGH (ref 11.5–15.5)
WBC: 8 10*3/uL (ref 4.0–10.5)
nRBC: 0.2 % (ref 0.0–0.2)

## 2020-01-25 LAB — COMPREHENSIVE METABOLIC PANEL
ALT: 19 U/L (ref 0–44)
AST: 35 U/L (ref 15–41)
Albumin: 2.4 g/dL — ABNORMAL LOW (ref 3.5–5.0)
Alkaline Phosphatase: 115 U/L (ref 38–126)
Anion gap: 10 (ref 5–15)
BUN: 73 mg/dL — ABNORMAL HIGH (ref 8–23)
CO2: 20 mmol/L — ABNORMAL LOW (ref 22–32)
Calcium: 8.4 mg/dL — ABNORMAL LOW (ref 8.9–10.3)
Chloride: 117 mmol/L — ABNORMAL HIGH (ref 98–111)
Creatinine, Ser: 3.78 mg/dL — ABNORMAL HIGH (ref 0.61–1.24)
GFR, Estimated: 15 mL/min — ABNORMAL LOW (ref >60)
Glucose, Bld: 158 mg/dL — ABNORMAL HIGH (ref 70–99)
Potassium: 4.2 mmol/L (ref 3.5–5.1)
Sodium: 147 mmol/L — ABNORMAL HIGH (ref 135–145)
Total Bilirubin: 0.9 mg/dL (ref 0.3–1.2)
Total Protein: 5.4 g/dL — ABNORMAL LOW (ref 6.5–8.1)

## 2020-01-25 LAB — FERRITIN: Ferritin: 122 ng/mL (ref 24–336)

## 2020-01-25 LAB — TYPE AND SCREEN
ABO/RH(D): O POS
Antibody Screen: NEGATIVE

## 2020-01-25 LAB — C-REACTIVE PROTEIN: CRP: 8.9 mg/dL — ABNORMAL HIGH (ref <1.0)

## 2020-01-25 MED ORDER — DOCUSATE SODIUM 100 MG PO CAPS
100.0000 mg | ORAL_CAPSULE | Freq: Two times a day (BID) | ORAL | Status: DC
  Administered 2020-01-25 – 2020-01-26 (×3): 100 mg via ORAL
  Filled 2020-01-25 (×3): qty 1

## 2020-01-25 MED ORDER — FERROUS SULFATE 325 (65 FE) MG PO TABS
325.0000 mg | ORAL_TABLET | Freq: Two times a day (BID) | ORAL | Status: DC
  Administered 2020-01-25 – 2020-01-26 (×3): 325 mg via ORAL
  Filled 2020-01-25 (×3): qty 1

## 2020-01-25 MED ORDER — SODIUM CHLORIDE 0.45 % IV SOLN: INTRAVENOUS | Status: DC

## 2020-01-25 MED ORDER — DEXTROSE 5 % IV SOLN: INTRAVENOUS | Status: AC

## 2020-01-25 NOTE — Progress Notes (Signed)
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 Likely secondary to pre-renal injury. Na up today, possibly volume down. 1/2NS today. Cr improving -no indications for renal replacement therapy at the moment. Discussions are in place between him and Dr. Marval Regal (primary nephrologist). 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 B/L cr around 3  -UA w/ sediment exam -renal u/s w/o obstruction however now with a possible left RCC -Continue to monitor daily Cr, Dose meds for GFR<15 -Monitor Daily I/Os, Daily weight  -Maintain MAP>65 for optimal renal perfusion.  -Agree with holding ACE-I, avoid further nephrotoxins including NSAIDS, Morphine.  Unless absolutely necessary, avoid CT with contrast and/or MRI with gadolinium.   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 Increased in size since prior study  -outpatient follow up, nothing to do about this at the moment. Will need this worked up extensively in the context of him already having a nephrectomy in the past  Hypertension: -resume home meds, stable BP  Hypernatremia -1/2NS today, ordered  UGIB -resolved, a/c on hold  COVID-19 PNA -steroids, remdesivir per primary service  Metabolic acidosis, +AG -corrected AG for albumin: ~16. Likely related to advanced ckd/aki, increased nahco3 to 1300mg  tid  Anemia due to chronic kidney disease and blood loss: -Transfuse for Hgb<7 g/dL -iron panel: iron deficient. hesitant on giving iron and/or ESA given active treatment for covid and possible RCC respectively  Hypoalbuminemia -push protein    Subjective:   Due to the COVID pandemic, in attempts to limit transmission and over-utilization of resources (e.g. PPE), the patient was seen virtually today by means of chart review, discussion with staff, and virtual discussion  with patient as needed.  No acute events. Cr better, Na up. UOP not measured, 3 voids charted.   Objective:   BP 130/68 (BP Location: Right Arm)   Pulse 70   Temp 98.6 F (37 C) (Oral)   Resp 18   Ht 5\' 7"  (1.702 m)   Wt 83.5 kg   SpO2 95%   BMI 28.83 kg/m   Intake/Output Summary (Last 24 hours) at 01/25/2020 1225 Last data filed at 01/25/2020 0700 Gross per 24 hour  Intake 200 ml  Output --  Net 200 ml   Weight change:   Physical Exam: Not examined.  Imaging: US RENAL  Result Date: 01/24/2020 CLINICAL DATA:  Acute kidney injury. Status post right-sided nephrectomy. EXAM: RENAL / URINARY TRACT ULTRASOUND COMPLETE COMPARISON:  CT dated 02/26/2019 FINDINGS: Right Kidney: Surgically absent Left Kidney: Renal measurements: 12.6 x 5.7 x 3.9 cm = volume: 148 mL. There is no hydronephrosis. The left kidney is echogenic. Small cysts are noted arising from the lower pole. Arising from the lower pole the left kidney is an indeterminate exophytic 2 x 1.4 cm nodule. This is not clearly cystic on this study. Additionally, this lesion appears to have grown since the prior CT. Bladder: Appears normal for degree of bladder distention. Other: None. IMPRESSION: 1. Status post right-sided nephrectomy. 2. No left-sided hydronephrosis. 3. Echogenic left kidney which can be seen in patients with medical renal disease. 4. Indeterminate, solid-appearing nodule rising from the lower pole the left kidney is concerning for renal cell carcinoma. This nodule appears to have increased in size since the patient's prior study given differences in modality. Electronically Signed   By: Jamie Kato.D.  On: 01/24/2020 04:02    Labs: BMET Recent Labs  Lab 01/20/20 1757 01/21/20 0600 01/22/20 0518 01/23/20 0215 01/24/20 0235 01/25/20 0205  NA 139 141 142 144 146* 147*  K 3.8 3.9 4.0 3.9 4.7 4.2  CL 108 111 114* 114* 117* 117*  CO2 16* 16* 16* 17* 18* 20*  GLUCOSE 109* 117* 124* 139* 171* 158*  BUN  52* 51* 56* 64* 69* 73*  CREATININE 4.37* 3.95* 3.87* 4.21* 3.96* 3.78*  CALCIUM 8.3* 8.4* 8.1* 8.2* 8.7* 8.4*  PHOS  --  4.3 4.4  --   --   --    CBC Recent Labs  Lab 01/22/20 0518 01/23/20 0215 01/24/20 0235 01/25/20 0205  WBC 7.5 6.4 4.8 8.0  NEUTROABS 6.3 5.1 4.2 7.2  HGB 8.2* 8.0* 8.2* 7.6*  HCT 28.3* 27.4* 26.6* 24.6*  MCV 85.8 84.6 82.9 82.6  PLT 139* 128* 128* 144*    Medications:    . amLODipine  2.5 mg Oral Daily  . atorvastatin  20 mg Oral Daily  . docusate sodium  100 mg Oral BID  . ferrous sulfate  325 mg Oral BID WC  . metoprolol succinate  25 mg Oral Daily  . pantoprazole  40 mg Oral BID  . predniSONE  40 mg Oral Q breakfast  . sodium bicarbonate  1,300 mg Oral TID  . tamsulosin  0.4 mg Oral Daily      Gean Quint, MD [facility] 01/25/2020, 12:25 PM

## 2020-01-25 NOTE — Progress Notes (Signed)
PROGRESS NOTE                                                                                                                                                                                                             Patient Demographics:    Paul Price, is a 80 y.o. male, DOB - [birth year], UJW:[id number]  Outpatient Primary MD for the patient is [name], MD   Admit date - 01/20/2020   LOS - 5  No chief complaint on file.      Brief Narrative: Patient is a 80 y.o. male with PMHx of PAF on Xarelto, secondary AV block-s/p PPM placement, chronic systolic heart failure, CAD s/p CABG, stage V CKD-prior history of GI bleeding-presenting with 4-day history of fever, and diarrhea, and black stools.  Found to have upper GI bleeding-and COVID-19 infection.  See below for further details.   COVID-19 vaccinated status: Vaccinated-but not boosted.  Significant Events: 1/11>> Admit to [facility] for UGI bleeding and COVID-19 infection.  Significant studies: 1/11>>Chest x-ray: No focal consolidation/pneumothorax 1/14>> chest x-ray: Increasing mixed patchy airspace/interstitial opacity throughout the lungs 1/15>> renal ultrasound: S/p right nephrectomy, solid appearing nodule arising from the lower pole of left kidney-concerning for RCC.  Nodule seems to have increased in size compared to prior study.  COVID-19 medications: Remdesivir: 1/11>>  Antibiotics: None  Microbiology data: 1/12>>Blood culture: no growth  Procedures: None  Consults: Nephrology  DVT prophylaxis: SCDs Start: 01/20/20 2203    Subjective:   Patient in bed, appears comfortable, denies any headache, no fever, no chest pain or pressure, no shortness of breath , no abdominal pain. No focal weakness.   Assessment  & Plan :   Upper GI bleeding: Has had prior episodes in the past-recent EGD/capsule study without any obvious abnormalities.  No further  episodes of hematochezia-hemoglobin relatively stable-diet has been advanced-PPI changed to oral route.  GI has signed off on 1/13.  Anticoagulation remains on hold-per GI note on 01/21/20-recommendations resume anticoagulation after 01/29/2020.  H&H has dropped slightly on 01/25/2020 could be due to dilution, will monitor, transfuse if hemoglobin drops around 7, type screen has been done.  COVID-19 pneumonia: Although not hypoxic-had significant coughing spells that has gradually improved.  Initial chest x-ray without pneumonia-but repeat chest x-ray shows significant infiltrates.  He appears clinically improved-we will complete Remdesivir on 1/16, started  steroids yesterday-CRP still elevated but now downtrending.  Continue supportive care.    O2 requirements:  SpO2: 95 %   COVID-19 Labs: Recent Labs    01/23/20 0215 01/24/20 0235 01/25/20 0205  FERRITIN 143 142 122  CRP 14.1* 13.4* 8.9*       Component Value Date/Time   BNP 881.8 (H) 01/21/2020 0900    Recent Labs  Lab 01/21/20 0600 01/23/20 0215  PROCALCITON 1.01 0.93    Lab Results  Component Value Date   SARSCOV2NAA POSITIVE (A) 01/20/2020   SARSCOV2NAA NEGATIVE 11/14/2019   [place] NEGATIVE 05/17/2019   [place] NEGATIVE 03/27/2019     Normocytic anemia: Secondary to chronic kidney disease-required 1 unit of PRBC on 1/6 as an outpatient.  Hemoglobin relatively stable-no evidence of significant blood loss at this point.  Follow closely.  AKI on CKD stage V with hypernatremia: Baseline creatinine around 3.5, AKI has improved, D5W gentle on 01/25/2020, may benefit from transfusion if hemoglobin drops around 7, nephrology is following appreciate their input.    History of solitary left kidney-s/p right nephrectomy for renal cell carcinoma  Nodule left kidney (possible renal cell cancer): See renal ultrasound above-patient will need outpatient follow-up with his primary urologist for further continued care.  [place]  metabolic acidosis: Secondary to AKI/CKD-continue oral bicarbonate-follow for now.  PAF: Paced rhythm-resume metoprolol-hold Xarelto due to GI bleeding.  Chronic systolic heart failure (EF 40-45% by echo on 01/08/2020): Compensated-resume diuretics over the next few days once renal function improves further.  History of second-degree AV block-s/p PPM placement  CAD s/p CABG-92-s/p DES/12: No anginal symptoms   HTN: BP better controlled-on amlodipine and metoprolol.  COPD: Stable-continue as needed inhalers  OSA: On CPAP  BPH: On Flomax    Condition - Stable  Family Communication  :  [phone] left a voicemail on 01/24/20, 01/25/20  Code Status :  Full Code  Diet :  Diet Order            Diet Heart Room service appropriate? Yes; Fluid consistency: Thin  Diet effective now                  Disposition Plan  :   Status is: Inpatient  Remains inpatient appropriate because:Inpatient level of care appropriate due to severity of illness   Dispo: The patient is from: Home              Anticipated d/c is to: Home              Anticipated d/c date is: 1 days              Patient currently is not medically stable to d/c.    Barriers to discharge: UGI bleeding-on IV PPI/complete 3 days of IV Remdesivir  Antimicorbials  :    Anti-infectives (From admission, onward)   Start     Dose/Rate Route Frequency Ordered Stop   01/23/20 1000  remdesivir 100 mg in sodium chloride 0.9 % 100 mL IVPB  Status:  Discontinued        100 mg 200 mL/hr over 30 Minutes Intravenous  Once 01/22/20 1056 01/23/20 0828   01/23/20 1000  remdesivir 100 mg in sodium chloride 0.9 % 100 mL IVPB  Status:  Discontinued        100 mg 200 mL/hr over 30 Minutes Intravenous Daily 01/23/20 0828 01/25/20 1015   01/21/20 1000  remdesivir 100 mg in sodium chloride 0.9 % 100 mL IVPB  Status:  Discontinued       "  Followed by" Linked Group Details   100 mg 200 mL/hr over 30 Minutes Intravenous Daily  01/20/20 2339 01/23/20 0838   01/20/20 2345  remdesivir 200 mg in sodium chloride 0.9% 250 mL IVPB       "Followed by" Linked Group Details   200 mg 580 mL/hr over 30 Minutes Intravenous Once 01/20/20 2339 01/21/20 0230      Inpatient Medications  Scheduled Meds: . amLODipine  2.5 mg Oral Daily  . atorvastatin  20 mg Oral Daily  . docusate sodium  100 mg Oral BID  . ferrous sulfate  325 mg Oral BID WC  . metoprolol succinate  25 mg Oral Daily  . pantoprazole  40 mg Oral BID  . predniSONE  40 mg Oral Q breakfast  . sodium bicarbonate  1,300 mg Oral TID  . tamsulosin  0.4 mg Oral Daily   Continuous Infusions: . dextrose     PRN Meds:.acetaminophen **OR** [DISCONTINUED] acetaminophen, albuterol   Time Spent in minutes  25    See all Orders from today for further details   Lala Lund M.D on 01/25/2020 at 10:16 AM  To page go to [email or web address] - use universal password  [facility] -  Office  [phone]    Objective:   Vitals:   01/24/20 0539 01/24/20 1541 01/24/20 2037 01/25/20 0358  BP: (!) 148/72 140/65 136/72 130/68  Pulse: 76 67 76 70  Resp: 20 20 19 18   Temp: 97.9 F (36.6 C) 97.6 F (36.4 C) 98 F (36.7 C) 98.6 F (37 C)  TempSrc:   Oral Oral  SpO2: 95% 95% 96% 95%  Weight:    83.5 kg  Height:        Wt Readings from Last 3 Encounters:  01/25/20 83.5 kg  12/10/19 84.4 kg  11/14/19 82.1 kg     Intake/Output Summary (Last 24 hours) at 01/25/2020 1016 Last data filed at 01/25/2020 0700 Gross per 24 hour  Intake 200 ml  Output -  Net 200 ml     Physical Exam  Awake Alert, No new F.N deficits, Normal affect [place].AT,PERRAL Supple Neck,No JVD, No cervical lymphadenopathy appriciated.  Symmetrical Chest wall movement, Good air movement bilaterally, CTAB RRR,No Gallops, Rubs or new Murmurs, No Parasternal Heave +ve B.Sounds, Abd Soft, No tenderness, No organomegaly appriciated, No rebound - guarding or rigidity. No Cyanosis, Clubbing or  edema, No new Rash or bruise    Data Review:    CBC Recent Labs  Lab 01/21/20 0600 01/22/20 0518 01/23/20 0215 01/24/20 0235 01/25/20 0205  WBC 7.2 7.5 6.4 4.8 8.0  HGB 8.6* 8.2* 8.0* 8.2* 7.6*  HCT 28.1* 28.3* 27.4* 26.6* 24.6*  PLT 141* 139* 128* 128* 144*  MCV 84.6 85.8 84.6 82.9 82.6  MCH 25.9* 24.8* 24.7* 25.5* 25.5*  MCHC 30.6 29.0* 29.2* 30.8 30.9  RDW 19.4* 19.5* 19.3* 19.4* 19.5*  LYMPHSABS 0.9 0.7 0.6* 0.4* 0.4*  MONOABS 0.4 0.5 0.5 0.1 0.4  EOSABS 0.0 0.0 0.0 0.0 0.0  BASOSABS 0.0 0.0 0.0 0.0 0.0    Chemistries  Recent Labs  Lab 01/21/20 0600 01/22/20 0518 01/23/20 0215 01/24/20 0235 01/25/20 0205  NA 141 142 144 146* 147*  K 3.9 4.0 3.9 4.7 4.2  CL 111 114* 114* 117* 117*  CO2 16* 16* 17* 18* 20*  GLUCOSE 117* 124* 139* 171* 158*  BUN 51* 56* 64* 69* 73*  CREATININE 3.95* 3.87* 4.21* 3.96* 3.78*  CALCIUM 8.4* 8.1* 8.2* 8.7* 8.4*  MG 2.1 1.9 2.5*  2.5* 2.4  AST 36 36 40 38 35  ALT 18 17 18 18 19   ALKPHOS 141* 130* 133* 126 115  BILITOT 0.5 0.8 0.8 0.8 0.9   ------------------------------------------------------------------------------------------------------------------ No results for input(s): CHOL, HDL, LDLCALC, TRIG, CHOLHDL, LDLDIRECT in the last 72 hours.  No results found for: HGBA1C ------------------------------------------------------------------------------------------------------------------ No results for input(s): TSH, T4TOTAL, T3FREE, THYROIDAB in the last 72 hours.  Invalid input(s): FREET3 ------------------------------------------------------------------------------------------------------------------ Recent Labs    01/24/20 0235 01/25/20 0205  FERRITIN 142 122  TIBC 238*  --   IRON 11*  --     Coagulation profile No results for input(s): INR, PROTIME in the last 168 hours.  No results for input(s): DDIMER in the last 72 hours.  Cardiac Enzymes No results for input(s): CKMB, TROPONINI, MYOGLOBIN in the last 168  hours.  Invalid input(s): CK ------------------------------------------------------------------------------------------------------------------    Component Value Date/Time   BNP 881.8 (H) 01/21/2020 0900    Micro Results Recent Results (from the past 240 hour(s))  SARS CORONAVIRUS 2 (TAT 6-24 HRS) Nasopharyngeal Nasopharyngeal Swab     Status: Abnormal   Collection Time: 01/20/20  6:32 PM   Specimen: Nasopharyngeal Swab  Result Value Ref Range Status   SARS Coronavirus 2 POSITIVE (A) NEGATIVE Final    Comment: (NOTE) SARS-CoV-2 target nucleic acids are DETECTED.  The SARS-CoV-2 RNA is generally detectable in upper and lower respiratory specimens during the acute phase of infection. Positive results are indicative of the presence of SARS-CoV-2 RNA. Clinical correlation with patient history and other diagnostic information is  necessary to determine patient infection status. Positive results do not rule out bacterial infection or co-infection with other viruses.  The expected result is Negative.  Fact Sheet for Patients: SugarRoll.be  Fact Sheet for Healthcare Providers: https://www.woods-mathews.com/  This test is not yet approved or cleared by the [place] FDA and  has been authorized for detection and/or diagnosis of SARS-CoV-2 by FDA under an Emergency Use Authorization (EUA). This EUA will remain  in effect (meaning this test can be used) for the duration of the COVID-19 declaration under Section 564(b)(1) of the Act, 21 U. S.C. section 360bbb-3(b)(1), unless the authorization is terminated or revoked sooner.   Performed at [facility], [place] [address], [place], [place] [address]   Culture, blood (Routine X 2) w Reflex to ID Panel     Status: None (Preliminary result)   Collection Time: 01/21/20  8:28 AM   Specimen: BLOOD RIGHT HAND  Result Value Ref Range Status   Specimen Description BLOOD RIGHT HAND  Final    Special Requests   Final    BOTTLES DRAWN AEROBIC AND ANAEROBIC Blood Culture results may not be optimal due to an inadequate volume of blood received in culture bottles   Culture   Final    NO GROWTH 4 DAYS Performed at [facility], [place] [address], [place], [place] [address]    Report Status PENDING  Incomplete    Radiology Reports US RENAL  Result Date: 01/24/2020 CLINICAL DATA:  Acute kidney injury. Status post right-sided nephrectomy. EXAM: RENAL / URINARY TRACT ULTRASOUND COMPLETE COMPARISON:  CT dated 02/26/2019 FINDINGS: Right Kidney: Surgically absent Left Kidney: Renal measurements: 12.6 x 5.7 x 3.9 cm = volume: 148 mL. There is no hydronephrosis. The left kidney is echogenic. Small cysts are noted arising from the lower pole. Arising from the lower pole the left kidney is an indeterminate exophytic 2 x 1.4 cm nodule. This is not clearly cystic  on this study. Additionally, this lesion appears to have grown since the prior CT. Bladder: Appears normal for degree of bladder distention. Other: None. IMPRESSION: 1. Status post right-sided nephrectomy. 2. No left-sided hydronephrosis. 3. Echogenic left kidney which can be seen in patients with medical renal disease. 4. Indeterminate, solid-appearing nodule rising from the lower pole the left kidney is concerning for renal cell carcinoma. This nodule appears to have increased in size since the patient's prior study given differences in modality. Electronically Signed   By: Constance Holster M.D.   On: 01/24/2020 04:02   DG Chest Port 1 View  Result Date: 01/23/2020 CLINICAL DATA:  Shortness of breath, COVID EXAM: PORTABLE CHEST 1 VIEW COMPARISON:  Radiograph 01/20/2020 FINDINGS: Pacer pack overlies left chest wall with leads in stable position directed towards the apex and right atrium. Stable cardiomediastinal contours with cardiomegaly and a calcified aorta. Postsurgical changes related to prior CABG including intact and aligned  sternotomy wires and multiple surgical clips projecting over the mediastinum. Telemetry leads overlie the chest. Increasing mixed patchy airspace and interstitial opacity throughout the lungs most coalescent in the left mid to lower lung periphery and right lung base. No pneumothorax. Suspect at least small left pleural effusion. Pulmonary vascularity is redistributed and indistinct. No acute osseous or soft tissue abnormality. Degenerative changes are present in the imaged spine and shoulders. IMPRESSION: 1. Increasing mixed patchy airspace and interstitial opacity throughout the lungs most coalescent in the left mid to lower lung periphery and right lung base, concerning for a developing infection, edema or likely some combination there of. Electronically Signed   By: Lovena Le M.D.   On: 01/23/2020 07:02   DG Chest Portable 1 View  Result Date: 01/20/2020 CLINICAL DATA:  Fever cough EXAM: PORTABLE CHEST 1 VIEW COMPARISON:  04/03/2019 FINDINGS: Post sternotomy changes. Left-sided pacing device as before. Cardiomegaly. Trace right-sided pleural effusion. No focal consolidation or pneumothorax. IMPRESSION: Cardiomegaly with trace right-sided pleural effusion. Electronically Signed   By: Donavan Foil M.D.   On: 01/20/2020 19:50   ECHOCARDIOGRAM COMPLETE  Result Date: 01/08/2020    ECHOCARDIOGRAM REPORT   Patient Name:   Paul Price Date of Exam: 01/08/2020 Medical Rec #:  [id number]      Height:       66.0 in Accession #:    [id number]     Weight:       186.0 lb Date of Birth:  [birth year]       BSA:          1.939 m Patient Age:    51 years       BP:           152/74 mmHg Patient Gender: M              HR:           60 bpm. 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 CAD, Prior CABG, Abnormal ECG and Pacemaker,                 Arrythmias:Atrial Fibrillation and RBBB,  Signs/Symptoms:Shortness of Breath and Chest Pain; Risk                 Factors:Hypertension, Dyslipidemia, Family History of Coronary                 Artery Disease, Former Smoker and Sleep Apnea.  Sonographer:    Deliah Boston RDCS Referring Phys: [place]  1. Left ventricular ejection fraction, by estimation, is 40 to 45%. The left ventricle has mildly decreased function. Left ventricular endocardial border not optimally defined to evaluate regional wall motion. Left ventricular diastolic function could not be evaluated. Elevated left ventricular end-diastolic pressure.  2. Right ventricular systolic function is normal. The right ventricular size is normal. There is mildly elevated pulmonary artery systolic pressure. The estimated right ventricular systolic pressure is 96.2 mmHg.  3. Left atrial size was mildly dilated.  4. The mitral valve is normal in structure. Mild mitral valve regurgitation. No evidence of mitral stenosis.  5. The aortic valve is normal in structure. Aortic valve regurgitation is not visualized. No aortic stenosis is present.  6. Aortic dilatation noted. There is mild dilatation of the aortic root, measuring 39 mm.  7. The inferior vena cava is normal in size with <50% respiratory variability, suggesting right atrial pressure of 8 mmHg.  8. Compared to study of 2018, PASP has decreased from 61 to 60mmHg. FINDINGS  Left Ventricle: Left ventricular ejection fraction, by estimation, is 40 to 45%. The left ventricle has mildly decreased function. Left ventricular endocardial border not optimally defined to evaluate regional wall motion. The left ventricular internal cavity size was normal in size. There is no left ventricular hypertrophy. Abnormal (paradoxical) septal motion, consistent with RV pacemaker. Left ventricular diastolic function  could not be evaluated due to paced rhythm. Left ventricular diastolic function could not be evaluated. Elevated left ventricular end-diastolic pressure. Right Ventricle: The right ventricular size is normal. No increase in right ventricular wall thickness. Right ventricular systolic function is normal. There is mildly elevated pulmonary artery systolic pressure. The tricuspid regurgitant velocity is 2.88  m/s, and with an assumed right atrial pressure of 8 mmHg, the estimated right ventricular systolic pressure is 95.2 mmHg. Left Atrium: Left atrial size was mildly dilated. Right Atrium: Right atrial size was normal in size. Pericardium: There is no evidence of pericardial effusion. Mitral Valve: The mitral valve is normal in structure. Mild mitral annular calcification. Mild mitral valve regurgitation. No evidence of mitral valve stenosis. Tricuspid Valve: The tricuspid valve is normal in structure. Tricuspid valve regurgitation is mild . No evidence of tricuspid stenosis. Aortic Valve: The aortic valve is normal in structure. Aortic valve regurgitation is not visualized. No aortic stenosis is present. Pulmonic Valve: The pulmonic valve was normal in structure. Pulmonic valve regurgitation is trivial. No evidence of pulmonic stenosis. Aorta: Aortic dilatation noted. There is mild dilatation of the aortic root, measuring 39 mm. Venous: The inferior vena cava is normal in size with less than 50% respiratory variability, suggesting right atrial pressure of 8 mmHg. IAS/Shunts: No atrial level shunt detected by color flow Doppler. Additional Comments: A pacer wire is visualized.  LEFT VENTRICLE PLAX 2D LVIDd:         5.20 cm  Diastology LVIDs:         4.40 cm  LV e' medial:    4.68 cm/s LV PW:         1.10 cm  LV E/e' medial:  21.3 LV IVS:  0.80 cm  LV e' lateral:   6.92 cm/s LVOT diam:     2.20 cm  LV E/e' lateral: 14.4 LV SV:         67 LV SV Index:   34 LVOT Area:     3.80 cm  RIGHT VENTRICLE TAPSE (M-mode):  1.6 cm LEFT ATRIUM             Index       RIGHT ATRIUM           Index LA diam:        4.50 cm 2.32 cm/m  RA Area:     25.80 cm LA Vol (A2C):   68.9 ml 35.53 ml/m RA Volume:   82.70 ml  42.65 ml/m LA Vol (A4C):   64.4 ml 33.21 ml/m LA Biplane Vol: 72.6 ml 37.44 ml/m  AORTIC VALVE LVOT Vmax:   79.20 cm/s LVOT Vmean:  50.500 cm/s LVOT VTI:    0.175 m  AORTA Ao Root diam: 3.90 cm Ao Asc diam:  3.40 cm MITRAL VALVE               TRICUSPID VALVE MV Area (PHT)  cm         TR Peak grad:   33.2 mmHg MV Decel Time: 216 msec    TR Vmax:        288.00 cm/s MR Peak grad: 115.8 mmHg MR Mean grad: 71.0 mmHg    SHUNTS MR Vmax:      538.00 cm/s  Systemic VTI:  0.18 m MR Vmean:     395.0 cm/s   Systemic Diam: 2.20 cm MV E velocity: 99.50 cm/s MV A velocity: 63.40 cm/s MV E/A ratio:  1.57 Fransico Him MD Electronically signed by Fransico Him MD Signature Date/Time: 01/08/2020/1:34:37 PM    Final    CUP PACEART REMOTE DEVICE CHECK  Result Date: 01/23/2020 Scheduled remote reviewed. Normal device function.  1 NSVT lasting 7 beats w/ rate 170's bpm Next remote 91 days. HB

## 2020-01-25 NOTE — Evaluation (Addendum)
Occupational Therapy Evaluation Patient Details Name: Paul Price MRN: [id number] DOB: 09-Jun-1940 Today's Date: 01/25/2020    History of Present Illness Pt is an 80 y/o male admitted secondary to dark stools likely from upper GI bleed. Pt also found to be COVID +. PMH includes a fib, DVT, HTN, gout, and OSA on CPAP.   Clinical Impression   This 80 y/o male presents with the above. PTA pt reports independence with ADL and functional mobility. Today pt performing mobility tasks within room using RW and multiple ADL tasks overall at minguard assist level. Pt with minimal DOE noted which improved with seated rest, completing activity on RA today. He reports he lives with spouse who can assist PRN after discharge. Pt to benefit from continued acute OT services to maximize his overall strength and endurance, safety and independence with ADL and mobility prior to discharge home. Do not anticipate he will require follow up OT services after discharge.     Follow Up Recommendations  Supervision/Assistance - 24 hour;No OT follow up (24hr initially)    Equipment Recommendations  None recommended by OT (pt's DME needs are met)           Precautions / Restrictions Precautions Precautions: Fall Restrictions Weight Bearing Restrictions: No      Mobility Bed Mobility Overal bed mobility: Needs Assistance Bed Mobility: Supine to Sit;Sit to Supine     Supine to sit: Supervision Sit to supine: Supervision   General bed mobility comments: for general safety, no assist required    Transfers Overall transfer level: Needs assistance Equipment used: Rolling walker (2 wheeled) Transfers: Sit to/from Stand Sit to Stand: Min guard         General transfer comment: VCs for safe hand placement, stood from EOB and recliner during session    Balance Overall balance assessment: Needs assistance Sitting-balance support: No upper extremity supported;Feet supported Sitting balance-[name] Scale:  Good     Standing balance support: Bilateral upper extremity supported;During functional activity;No upper extremity supported Standing balance-[name] Scale: Fair Standing balance comment: Able to maintain static standing without UE support; ambulated with occasional UE support                           ADL either performed or assessed with clinical judgement   ADL Overall ADL's : Needs assistance/impaired Eating/Feeding: Modified independent;Sitting   Grooming: Wash/dry face;Wash/dry hands;Min guard;Standing   Upper Body Bathing: Modified independent;Sitting   Lower Body Bathing: Min guard;Sit to/from stand   Upper Body Dressing : Modified independent;Sitting   Lower Body Dressing: Min guard;Sit to/from stand Lower Body Dressing Details (indicate cue type and reason): minguard for standing balance, pt donnign socks via figure 4 EOB without difficulty Toilet Transfer: Min guard;Ambulation;RW   Toileting- Water quality scientist and Hygiene: Min guard;Sit to/from stand Toileting - Clothing Manipulation Details (indicate cue type and reason): pt voiding bladder while standing at toilet, managing gown/briefs without assist     Functional mobility during ADLs: Min guard;Rolling walker General ADL Comments: minimal DOE noted with activity (1-2/4)                         Pertinent Vitals/Pain Pain Assessment: Faces Faces Pain Scale: No hurt Pain Intervention(s): Monitored during session     Hand Dominance     Extremity/Trunk Assessment Upper Extremity Assessment Upper Extremity Assessment: Generalized weakness   Lower Extremity Assessment Lower Extremity Assessment: Defer to PT evaluation  Cervical / Trunk Assessment Cervical / Trunk Assessment: Normal   Communication Communication Communication: HOH   Cognition Arousal/Alertness: Lethargic;Awake/alert Behavior During Therapy: WFL for tasks assessed/performed                                    General Comments: Pt at times required repetition and with occasional odd response - however suspect likely due to [facility].  overall appers WFL for basic tasks. pt sleeping soundly upon arrival to room but once aroused pt able to remain awake/alert   General Comments       Exercises     Shoulder Instructions      Home Living Family/patient expects to be discharged to:: Private residence Living Arrangements: Spouse/significant other Available Help at Discharge: Family;Available 24 hours/day Type of Home: House Home Access: Stairs to enter [employer] of Steps: 6 Entrance Stairs-Rails: Right Home Layout: One level     Bathroom Shower/Tub: Walk-in shower;Tub/shower unit   Bathroom Toilet: Handicapped height     Home Equipment: Environmental consultant - 2 wheels;Cane - single point;Other (comment);Wheelchair - manual;Shower seat (walking stick)          Prior Functioning/Environment Level of Independence: Independent                 OT Problem List: Decreased strength;Decreased activity tolerance;Impaired balance (sitting and/or standing);Decreased knowledge of use of DME or AE;Cardiopulmonary status limiting activity      OT Treatment/Interventions: Self-care/ADL training;Therapeutic exercise;Energy conservation;DME and/or AE instruction;Therapeutic activities;Patient/family education;Balance training    OT Goals(Current goals can be found in the care plan section) Acute Rehab OT Goals Patient Stated Goal: to go home OT Goal Formulation: With patient Time For Goal Achievement: 02/08/20 Potential to Achieve Goals: Good  OT Frequency: Min 2X/week   Barriers to D/C:            Co-evaluation              AM-PAC OT "6 Clicks" Daily Activity     Outcome Measure Help from another person eating meals?: None Help from another person taking care of personal grooming?: A Little Help from another person toileting, which includes using toliet, bedpan, or urinal?:  A Little Help from another person bathing (including washing, rinsing, drying)?: A Little Help from another person to put on and taking off regular upper body clothing?: None Help from another person to put on and taking off regular lower body clothing?: A Little 6 Click Score: 20   End of Session Equipment Utilized During Treatment: Rolling walker Nurse Communication: Mobility status  Activity Tolerance:   Patient left: in bed;with call bell/phone within reach;with bed alarm set  OT Visit Diagnosis: Muscle weakness (generalized) (M62.81);Unsteadiness on feet (R26.81)                Time: 3953-2023 OT Time Calculation (min): 31 min Charges:  OT General Charges $OT Visit: 1 Visit OT Evaluation $OT Eval Moderate Complexity: 1 Mod OT Treatments $Self Care/Home Management : 8-22 mins  Lou Cal, OT Acute Rehabilitation Services Pager [phone] Office [phone]   Raymondo Band 01/25/2020, 11:50 AM

## 2020-01-25 NOTE — Progress Notes (Signed)
Pt ambulated in room to bathroom and back. Pt O2 sat 92% on room air.

## 2020-01-26 ENCOUNTER — Other Ambulatory Visit (HOSPITAL_COMMUNITY): Payer: Self-pay | Admitting: Internal Medicine

## 2020-01-26 DIAGNOSIS — K922 Gastrointestinal hemorrhage, unspecified: Secondary | ICD-10-CM | POA: Diagnosis not present

## 2020-01-26 LAB — CBC WITH DIFFERENTIAL/PLATELET
Abs Immature Granulocytes: 0.18 10*3/uL — ABNORMAL HIGH (ref 0.00–0.07)
Basophils Absolute: 0 10*3/uL (ref 0.0–0.1)
Basophils Relative: 0 %
Eosinophils Absolute: 0 10*3/uL (ref 0.0–0.5)
Eosinophils Relative: 0 %
HCT: 25.4 % — ABNORMAL LOW (ref 39.0–52.0)
Hemoglobin: 7.9 g/dL — ABNORMAL LOW (ref 13.0–17.0)
Immature Granulocytes: 2 %
Lymphocytes Relative: 4 %
Lymphs Abs: 0.4 10*3/uL — ABNORMAL LOW (ref 0.7–4.0)
MCH: 25.7 pg — ABNORMAL LOW (ref 26.0–34.0)
MCHC: 31.1 g/dL (ref 30.0–36.0)
MCV: 82.7 fL (ref 80.0–100.0)
Monocytes Absolute: 0.3 10*3/uL (ref 0.1–1.0)
Monocytes Relative: 3 %
Neutro Abs: 9.8 10*3/uL — ABNORMAL HIGH (ref 1.7–7.7)
Neutrophils Relative %: 91 %
Platelets: 142 10*3/uL — ABNORMAL LOW (ref 150–400)
RBC: 3.07 MIL/uL — ABNORMAL LOW (ref 4.22–5.81)
RDW: 19.6 % — ABNORMAL HIGH (ref 11.5–15.5)
WBC: 10.7 10*3/uL — ABNORMAL HIGH (ref 4.0–10.5)
nRBC: 0.2 % (ref 0.0–0.2)

## 2020-01-26 LAB — COMPREHENSIVE METABOLIC PANEL
ALT: 24 U/L (ref 0–44)
AST: 38 U/L (ref 15–41)
Albumin: 2.5 g/dL — ABNORMAL LOW (ref 3.5–5.0)
Alkaline Phosphatase: 132 U/L — ABNORMAL HIGH (ref 38–126)
Anion gap: 12 (ref 5–15)
BUN: 78 mg/dL — ABNORMAL HIGH (ref 8–23)
CO2: 19 mmol/L — ABNORMAL LOW (ref 22–32)
Calcium: 8.4 mg/dL — ABNORMAL LOW (ref 8.9–10.3)
Chloride: 111 mmol/L (ref 98–111)
Creatinine, Ser: 3.71 mg/dL — ABNORMAL HIGH (ref 0.61–1.24)
GFR, Estimated: 16 mL/min — ABNORMAL LOW (ref >60)
Glucose, Bld: 154 mg/dL — ABNORMAL HIGH (ref 70–99)
Potassium: 4.2 mmol/L (ref 3.5–5.1)
Sodium: 142 mmol/L (ref 135–145)
Total Bilirubin: 1 mg/dL (ref 0.3–1.2)
Total Protein: 5.2 g/dL — ABNORMAL LOW (ref 6.5–8.1)

## 2020-01-26 LAB — CULTURE, BLOOD (ROUTINE X 2): Culture: NO GROWTH

## 2020-01-26 LAB — BRAIN NATRIURETIC PEPTIDE: B Natriuretic Peptide: 613.2 pg/mL — ABNORMAL HIGH (ref 0.0–100.0)

## 2020-01-26 LAB — MAGNESIUM: Magnesium: 2.3 mg/dL (ref 1.7–2.4)

## 2020-01-26 MED ORDER — SODIUM BICARBONATE 650 MG PO TABS: 650.0000 mg | ORAL_TABLET | Freq: Three times a day (TID) | ORAL | 0 refills | Status: DC

## 2020-01-26 MED ORDER — RIVAROXABAN 15 MG PO TABS: 15.0000 mg | ORAL_TABLET | Freq: Every day | ORAL | Status: DC

## 2020-01-26 MED ORDER — FERROUS SULFATE 325 (65 FE) MG PO TABS: 325.0000 mg | ORAL_TABLET | Freq: Two times a day (BID) | ORAL | 0 refills | Status: DC

## 2020-01-26 MED ORDER — DOCUSATE SODIUM 100 MG PO CAPS: 200.0000 mg | ORAL_CAPSULE | Freq: Every day | ORAL | 0 refills | Status: DC

## 2020-01-26 MED ORDER — PANTOPRAZOLE SODIUM 40 MG PO TBEC: 40.0000 mg | DELAYED_RELEASE_TABLET | Freq: Two times a day (BID) | ORAL | Status: DC

## 2020-01-26 MED FILL — PANTOPRAZOLE SOD DR 40 MG T: 40 | 30 days supply | Qty: 60 | Fill #0

## 2020-01-26 MED FILL — SODIUM BICARBONATE 650 MG T: 650 | 30 days supply | Qty: 90 | Fill #0

## 2020-01-26 MED FILL — FERROUS SULFATE 325 MG TAB: 325 (65 FE) | 30 days supply | Qty: 60 | Fill #0

## 2020-01-26 MED FILL — DOCUSATE SODIUM 100 MG CAPS: 100 | 30 days supply | Qty: 60 | Fill #0

## 2020-01-26 NOTE — Discharge Instructions (Signed)
Follow with Primary MD [name], MD in 7 days   Get CBC, CMP, 2 view Chest X ray -  checked next visit within 1 week by Primary MD   Activity: As tolerated with Full fall precautions use walker/cane & assistance as needed  Disposition Home    Diet: Heart Healthy    Special Instructions: If you have smoked or chewed Tobacco  in the last 2 yrs please stop smoking, stop any regular Alcohol  and or any Recreational drug use.  On your next visit with your primary care physician please Get Medicines reviewed and adjusted.  Please request your Prim.MD to go over all Hospital Tests and Procedure/Radiological results at the follow up, please get all Hospital records sent to your Prim MD by signing hospital release before you go home.  If you experience worsening of your admission symptoms, develop shortness of breath, life threatening emergency, suicidal or homicidal thoughts you must seek medical attention immediately by calling 911 or calling your MD immediately  if symptoms less severe.  You Must read complete instructions/literature along with all the possible adverse reactions/side effects for all the Medicines you take and that have been prescribed to you. Take any new Medicines after you have completely understood and accpet all the possible adverse reactions/side effects.         Person Under Monitoring Name: Paul Price  Location: [place] [address]   Infection Prevention Recommendations for Individuals Confirmed to have, or Being Evaluated for, 2019 Novel Coronavirus (COVID-19) Infection Who Receive Care at Home  Individuals who are confirmed to have, or are being evaluated for, COVID-19 should follow the prevention steps below until a healthcare provider or local or state health department says they can return to normal activities.  Stay home except to get medical care You should restrict activities outside your home, except for getting medical  care. Do not go to work, school, or public areas, and do not use public transportation or taxis.  Call ahead before visiting your doctor Before your medical appointment, call the healthcare provider and tell them that you have, or are being evaluated for, COVID-19 infection. This will help the healthcare providers office take steps to keep other people from getting infected. Ask your healthcare provider to call the local or state health department.  Monitor your symptoms Seek prompt medical attention if your illness is worsening (e.g., difficulty breathing). Before going to your medical appointment, call the healthcare provider and tell them that you have, or are being evaluated for, COVID-19 infection. Ask your healthcare provider to call the local or state health department.  Wear a facemask You should wear a facemask that covers your nose and mouth when you are in the same room with other people and when you visit a healthcare provider. People who live with or visit you should also wear a facemask while they are in the same room with you.  Separate yourself from other people in your home As much as possible, you should stay in a different room from other people in your home. Also, you should use a separate bathroom, if available.  Avoid sharing household items You should not share dishes, drinking glasses, cups, eating utensils, towels, bedding, or other items with other people in your home. After using these items, you should wash them thoroughly with soap and water.  Cover your coughs and sneezes Cover your mouth and nose with a tissue when you cough or sneeze, or you can  cough or sneeze into your sleeve. Throw used tissues in a lined trash can, and immediately wash your hands with soap and water for at least 20 seconds or use an alcohol-based hand rub.  Wash your [facility] your hands often and thoroughly with soap and water for at least 20 seconds. You can use an alcohol-based  hand sanitizer if soap and water are not available and if your hands are not visibly dirty. Avoid touching your eyes, nose, and mouth with unwashed hands.   Prevention Steps for Caregivers and Household Members of Individuals Confirmed to have, or Being Evaluated for, COVID-19 Infection Being Cared for in the Home  If you live with, or provide care at home for, a person confirmed to have, or being evaluated for, COVID-19 infection please follow these guidelines to prevent infection:  Follow healthcare providers instructions Make sure that you understand and can help the patient follow any healthcare provider instructions for all care.  Provide for the patients basic needs You should help the patient with basic needs in the home and provide support for getting groceries, prescriptions, and other personal needs.  Monitor the patients symptoms If they are getting sicker, call his or her medical provider and tell them that the patient has, or is being evaluated for, COVID-19 infection. This will help the healthcare providers office take steps to keep other people from getting infected. Ask the healthcare provider to call the local or state health department.  Limit the number of people who have contact with the patient  If possible, have only one caregiver for the patient.  Other household members should stay in another home or place of residence. If this is not possible, they should stay  in another room, or be separated from the patient as much as possible. Use a separate bathroom, if available.  Restrict visitors who do not have an essential need to be in the home.  Keep older adults, very young children, and other sick people away from the patient Keep older adults, very young children, and those who have compromised immune systems or chronic health conditions away from the patient. This includes people with chronic heart, lung, or kidney conditions, diabetes, and  cancer.  Ensure good ventilation Make sure that shared spaces in the home have good air flow, such as from an air conditioner or an opened window, weather permitting.  Wash your hands often  Wash your hands often and thoroughly with soap and water for at least 20 seconds. You can use an alcohol based hand sanitizer if soap and water are not available and if your hands are not visibly dirty.  Avoid touching your eyes, nose, and mouth with unwashed hands.  Use disposable paper towels to dry your hands. If not available, use dedicated cloth towels and replace them when they become wet.  Wear a facemask and gloves  Wear a disposable facemask at all times in the room and gloves when you touch or have contact with the patients blood, body fluids, and/or secretions or excretions, such as sweat, saliva, sputum, nasal mucus, vomit, urine, or feces.  Ensure the mask fits over your nose and mouth tightly, and do not touch it during use.  Throw out disposable facemasks and gloves after using them. Do not reuse.  Wash your hands immediately after removing your facemask and gloves.  If your personal clothing becomes contaminated, carefully remove clothing and launder. Wash your hands after handling contaminated clothing.  Place all used disposable facemasks, gloves, and  other waste in a lined container before disposing them with other household waste.  Remove gloves and wash your hands immediately after handling these items.  Do not share dishes, glasses, or other household items with the patient  Avoid sharing household items. You should not share dishes, drinking glasses, cups, eating utensils, towels, bedding, or other items with a patient who is confirmed to have, or being evaluated for, COVID-19 infection.  After the person uses these items, you should wash them thoroughly with soap and water.  Wash laundry thoroughly  Immediately remove and wash clothes or bedding that have blood, body  fluids, and/or secretions or excretions, such as sweat, saliva, sputum, nasal mucus, vomit, urine, or feces, on them.  Wear gloves when handling laundry from the patient.  Read and follow directions on labels of laundry or clothing items and detergent. In general, wash and dry with the warmest temperatures recommended on the label.  Clean all areas the individual has used often  Clean all touchable surfaces, such as counters, tabletops, doorknobs, bathroom fixtures, toilets, phones, keyboards, tablets, and bedside tables, every day. Also, clean any surfaces that may have blood, body fluids, and/or secretions or excretions on them.  Wear gloves when cleaning surfaces the patient has come in contact with.  Use a diluted bleach solution (e.g., dilute bleach with 1 part bleach and 10 parts water) or a household disinfectant with a label that says EPA-registered for coronaviruses. To make a bleach solution at home, add 1 tablespoon of bleach to 1 quart (4 cups) of water. For a larger supply, add  cup of bleach to 1 gallon (16 cups) of water.  Read labels of cleaning products and follow recommendations provided on product labels. Labels contain instructions for safe and effective use of the cleaning product including precautions you should take when applying the product, such as wearing gloves or eye protection and making sure you have good ventilation during use of the product.  Remove gloves and wash hands immediately after cleaning.  Monitor yourself for signs and symptoms of illness Caregivers and household members are considered close contacts, should monitor their health, and will be asked to limit movement outside of the home to the extent possible. Follow the monitoring steps for close contacts listed on the symptom monitoring form.   ? If you have additional questions, contact your local health department or call the epidemiologist on call at [phone] (available 24/7). ? This  guidance is subject to change. For the most up-to-date guidance from [facility], please refer to their website: [email or web address]

## 2020-01-26 NOTE — Progress Notes (Signed)
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 Likely secondary to pre-renal injury.  -no indications for renal replacement therapy at the moment. Discussions are in place between him and Dr. Marval Regal (primary nephrologist). 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 Increased in size since prior study  -outpatient follow up  Hypertension: Just got meds - this AM's reading appears to be an outlier as was 130's and 140's much of 1/16   Hypernatremia - resolved   UGIB -resolved, a/c on hold  COVID-19 PNA -steroids, s/p remdesivir per primary service  Metabolic acidosis, +AG -corrected AG for albumin: ~16. Likely related to advanced ckd/aki - on sodium bicarb  Anemia due to chronic kidney disease and blood loss: -Transfuse for Hgb<7 g/dL -iron panel: iron deficient. hesitant on giving iron and/or ESA given active treatment for covid and possible RCC respectively  Hypoalbuminemia -push protein  will set up follow-up with [facility] with Dr. Marval Regal  Subjective:   He is to be discharged today.  Due to the nature of this patient's suspected COVID-19 with isolation and in keeping with efforts to prevent the spread of infection and to conserve personal protective equipment, a physical exam was not personally performed.  Patient's symptoms and exam were discussed in detail with the RN.  A chart review of other providers notes and the patient's lab work as well as review of other pertinent studies was performed.  Exam details from prior documentation were reviewed specifically and confirmed with the bedside nurse.  [facility] of service: [facility]   Per RN, he has not had shortness of breath or nausea/vomiting.     Objective:   BP (!)  172/63   Pulse 77   Temp 97.7 F (36.5 C)   Resp 16   Ht 5\' 7"  (1.702 m)   Wt 83.5 kg   SpO2 95%   BMI 28.83 kg/m  No intake or output data in the 24 hours ending 01/26/20 1151 Weight change:   Physical Exam: General adult male in bed in no acute distress - examined via window HEENT normocephalic atraumatic Lungs unlabored at rest Cards - no edema per nursing.    Imaging: No results found.  Labs: BMET Recent Labs  Lab 01/20/20 1757 01/21/20 0600 01/22/20 0518 01/23/20 0215 01/24/20 0235 01/25/20 0205 01/26/20 0350  NA 139 141 142 144 146* 147* 142  K 3.8 3.9 4.0 3.9 4.7 4.2 4.2  CL 108 111 114* 114* 117* 117* 111  CO2 16* 16* 16* 17* 18* 20* 19*  GLUCOSE 109* 117* 124* 139* 171* 158* 154*  BUN 52* 51* 56* 64* 69* 73* 78*  CREATININE 4.37* 3.95* 3.87* 4.21* 3.96* 3.78* 3.71*  CALCIUM 8.3* 8.4* 8.1* 8.2* 8.7* 8.4* 8.4*  PHOS  --  4.3 4.4  --   --   --   --    CBC Recent Labs  Lab 01/23/20 0215 01/24/20 0235 01/25/20 0205 01/26/20 0350  WBC 6.4 4.8 8.0 10.7*  NEUTROABS 5.1 4.2 7.2 9.8*  HGB 8.0* 8.2* 7.6* 7.9*  HCT 27.4* 26.6* 24.6* 25.4*  MCV 84.6 82.9 82.6 82.7  PLT 128* 128* 144* 142*    Medications:    . amLODipine  2.5 mg Oral Daily  . atorvastatin  20 mg  Oral Daily  . docusate sodium  100 mg Oral BID  . ferrous sulfate  325 mg Oral BID WC  . metoprolol succinate  25 mg Oral Daily  . pantoprazole  40 mg Oral BID  . predniSONE  40 mg Oral Q breakfast  . sodium bicarbonate  1,300 mg Oral TID  . tamsulosin  0.4 mg Oral Daily      Claudia Desanctis, MD 01/26/2020 12:01 PM

## 2020-01-26 NOTE — Discharge Summary (Signed)
Paul Price CVK:[id number] DOB: 22-Apr-1940 DOA: 01/20/2020  PCP: Paul Pretty, MD  Admit date: 01/20/2020  Discharge date: 01/26/2020  Admitted From: Home   Disposition:  Home   Recommendations for Outpatient Follow-up:   Follow up with PCP in 1-2 weeks  PCP Please obtain BMP/CBC, 2 view CXR in 1week,  (see Discharge instructions)   PCP Please follow up on the following pending results: Monitor CBC, BMP, needs outpatient GI follow-up.  BC, CMP in a week along with anemia panel.  Needs close outpatient nephrology follow-up as well.   Home Health: RN< PT, OT   Equipment/Devices: Rolling walker  Consultations: Renal, GI Discharge Condition: Stable    CODE STATUS: Full    Diet Recommendation: Heart Healthy   Diet Order            Diet - low sodium heart healthy           Diet Heart Room service appropriate? Yes; Fluid consistency: Thin  Diet effective now                  CC - blood    Brief history of present illness from the day of admission and additional interim summary    Patient is a 80 y.o. male with PMHx of PAF on Xarelto, secondary AV block-s/p PPM placement, chronic systolic heart failure, CAD s/p CABG, stage V CKD-prior history of GI bleeding-presenting with 4-day history of fever, and diarrhea, and black stools.  Found to have upper GI bleeding-and COVID-19 infection.  See below for further details.   COVID-19 vaccinated status: Vaccinated-but not boosted.  Significant Events: 1/11>> Admit to [facility] for UGI bleeding and COVID-19 infection.  Significant studies: 1/11>>Chest x-ray: No focal consolidation/pneumothorax 1/14>> chest x-ray: Increasing mixed patchy airspace/interstitial opacity throughout the lungs 1/15>> renal ultrasound: S/p right nephrectomy, solid appearing nodule arising  from the lower pole of left kidney-concerning for RCC.  Nodule seems to have increased in size compared to prior study.                                                                 Hospital Course    Upper GI bleeding: Has had prior episodes in the past-recent EGD/capsule study without any obvious abnormalities.  No further episodes of hematochezia-hemoglobin relatively stable-diet has been advanced-PPI changed to oral route.  GI has signed off on 1/13.  Anticoagulation remains on hold-per GI note on 01/21/20-recommendations resume anticoagulation after 01/30/2020.    H&H gradually improving on oral PPI and iron, will be discharged on this with close outpatient follow-up with GI and PCP.  COVID-19 pneumonia: Although not hypoxic-had significant coughing spells that has gradually improved.  Initial chest x-ray without pneumonia-but repeat chest x-ray shows significant infiltrates.    Treated with short course of steroids and Remdesivir  which he has completed, he is symptom-free on room air.  Normocytic anemia: Secondary to chronic kidney disease-required 1 unit of PRBC on 1/6 as an outpatient.  Kindly see upper GI bleeding above.  AKI on CKD stage V with hypernatremia: Baseline creatinine around 3.5, AKI has improved, was hydrated with IV fluids and placed on bicarbonate orally, will be discharged on oral bicarb and home dose diuretics, to be followed closely by PCP, please make sure he follows with nephrology outpatient also.    History of solitary left kidney-s/p right nephrectomy for renal cell carcinoma  Nodule left kidney (possible renal cell cancer): See renal ultrasound above-patient will need outpatient follow-up with his primary urologist for further continued care.  [place] metabolic acidosis: Secondary to AKI/CKD-improved, placed on PO Bicarb, PCP to follow.  PAF: Paced rhythm-resume metoprolol-hold Xarelto due to GI bleeding, resume 01/31/20.  Chronic systolic heart failure (EF  40-45% by echo on 01/08/2020): Compensated-resume diuretics upon DC.  History of second-degree AV block-s/p PPM placement  CAD s/p CABG-92-s/p DES/12: No anginal symptoms   HTN: BP better on home Rx.  COPD: Stable-continue as needed inhalers  OSA: On CPAP QHS.  BPH: On Home Rx.   Discharge diagnosis     Active Problems:   CAD - CABG '92, LAD DES 4/12, low risk Myoview 6/13   HTN (hypertension)   Hyperlipidemia with target LDL less than 70   OSA on CPAP   PAF (paroxysmal atrial fibrillation) (HCC)   Fever   Upper GI bleeding   Systolic congestive heart failure (HCC)   Second degree Mobitz I AV block   Renal mass   Symptomatic anemia   Stage 5 chronic kidney disease ([place])   Upper GI bleed   COVID-19 virus infection   UGI bleed    Discharge instructions    Discharge Instructions    Diet - low sodium heart healthy   Complete by: As directed    Discharge instructions   Complete by: As directed    Follow with Primary MD Paul Pretty, MD in 7 days   Get CBC, CMP, 2 view Chest X ray -  checked next visit within 1 week by Primary MD   Activity: As tolerated with Full fall precautions use walker/cane & assistance as needed  Disposition Home    Diet: Heart Healthy    Special Instructions: If you have smoked or chewed Tobacco  in the last 2 yrs please stop smoking, stop any regular Alcohol  and or any Recreational drug use.  On your next visit with your primary care physician please Get Medicines reviewed and adjusted.  Please request your Prim.MD to go over all Hospital Tests and Procedure/Radiological results at the follow up, please get all Hospital records sent to your Prim MD by signing hospital release before you go home.  If you experience worsening of your admission symptoms, develop shortness of breath, life threatening emergency, suicidal or homicidal thoughts you must seek medical attention immediately by calling 911 or calling your MD immediately  if  symptoms less severe.  You Must read complete instructions/literature along with all the possible adverse reactions/side effects for all the Medicines you take and that have been prescribed to you. Take any new Medicines after you have completely understood and accpet all the possible adverse reactions/side effects.   Increase activity slowly   Complete by: As directed       Discharge Medications   Allergies as of 01/26/2020      Reactions  Simvastatin Other (See Comments)   Abdominal cramps   Nitrostat [nitroglycerin] Other (See Comments)   Causes blood pressure to "bottom out"      Medication List    STOP taking these medications   PriLOSEC OTC 20 MG tablet Generic drug: omeprazole     TAKE these medications   acetaminophen 500 MG tablet Commonly known as: TYLENOL Take 500-1,000 mg by mouth every 6 (six) hours as needed for mild pain, moderate pain or headache.   allopurinol 300 MG tablet Commonly known as: ZYLOPRIM Take 300 mg by mouth daily.   amLODipine 5 MG tablet Commonly known as: NORVASC Take 2.5 mg by mouth daily.   atorvastatin 20 MG tablet Commonly known as: LIPITOR TAKE 1 TABLET BY MOUTH DAILY   b complex vitamins tablet Take 1 tablet by mouth daily.   docusate sodium 100 MG capsule Commonly known as: COLACE Take 2 capsules (200 mg total) by mouth daily.   ferrous sulfate 325 (65 FE) MG tablet Take 1 tablet (325 mg total) by mouth 2 (two) times daily with a meal.   furosemide 40 MG tablet Commonly known as: LASIX Take 0.5 tablets (20 mg total) by mouth daily.   metoprolol succinate 25 MG 24 hr tablet Commonly known as: TOPROL-XL TAKE 1 TABLET BY MOUTH DAILY   pantoprazole 40 MG tablet Commonly known as: PROTONIX Take 1 tablet (40 mg total) by mouth 2 (two) times daily.   Rivaroxaban 15 MG Tabs tablet Commonly known as: XARELTO Take 1 tablet (15 mg total) by mouth daily after supper. Start taking on: February 01, 2020 What changed: These  instructions start on February 01, 2020. If you are unsure what to do until then, ask your doctor or other care provider.   sodium bicarbonate 650 MG tablet Take 1 tablet (650 mg total) by mouth 3 (three) times daily.   tamsulosin 0.4 MG Caps capsule Commonly known as: Flomax Take 1 capsule (0.4 mg total) by mouth daily.            Durable Medical Equipment  (From admission, onward)         Start     Ordered   01/26/20 0956  For home use only DME Walker rolling  Once       Comments: 5 wheel  Question Answer Comment  Walker: With 5 Inch Wheels   Patient needs a walker to treat with the following condition Weakness      01/26/20 0955           Follow-up Information    Paul Pretty, MD. Schedule an appointment as soon as possible for a visit in 1 week(s).   Specialty: Internal Medicine Contact information: [address] [place] [address] [phone]        Troy Sine, MD .   Specialty: Cardiology Contact information: [address] [place] [address] [phone]        Wilford Corner, MD. Schedule an appointment as soon as possible for a visit in 1 week(s).   Specialty: Gastroenterology Contact information: [address] [place] [address] [phone]               Major procedures and Radiology Reports - PLEASE review detailed and final reports thoroughly  -       US RENAL  Result Date: 01/24/2020 CLINICAL DATA:  Acute kidney injury. Status post right-sided nephrectomy. EXAM: RENAL / URINARY TRACT ULTRASOUND COMPLETE COMPARISON:  CT dated 02/26/2019  FINDINGS: Right Kidney: Surgically absent Left Kidney: Renal measurements: 12.6 x 5.7 x 3.9 cm = volume: 148 mL. There is no hydronephrosis. The left kidney is echogenic. Small cysts are noted arising from the lower pole. Arising from the lower pole the left kidney is an indeterminate exophytic 2 x 1.4 cm nodule. This is not clearly  cystic on this study. Additionally, this lesion appears to have grown since the prior CT. Bladder: Appears normal for degree of bladder distention. Other: None. IMPRESSION: 1. Status post right-sided nephrectomy. 2. No left-sided hydronephrosis. 3. Echogenic left kidney which can be seen in patients with medical renal disease. 4. Indeterminate, solid-appearing nodule rising from the lower pole the left kidney is concerning for renal cell carcinoma. This nodule appears to have increased in size since the patient's prior study given differences in modality. Electronically Signed   By: Constance Holster M.D.   On: 01/24/2020 04:02   DG Chest Port 1 View  Result Date: 01/23/2020 CLINICAL DATA:  Shortness of breath, COVID EXAM: PORTABLE CHEST 1 VIEW COMPARISON:  Radiograph 01/20/2020 FINDINGS: Pacer pack overlies left chest wall with leads in stable position directed towards the apex and right atrium. Stable cardiomediastinal contours with cardiomegaly and a calcified aorta. Postsurgical changes related to prior CABG including intact and aligned sternotomy wires and multiple surgical clips projecting over the mediastinum. Telemetry leads overlie the chest. Increasing mixed patchy airspace and interstitial opacity throughout the lungs most coalescent in the left mid to lower lung periphery and right lung base. No pneumothorax. Suspect at least small left pleural effusion. Pulmonary vascularity is redistributed and indistinct. No acute osseous or soft tissue abnormality. Degenerative changes are present in the imaged spine and shoulders. IMPRESSION: 1. Increasing mixed patchy airspace and interstitial opacity throughout the lungs most coalescent in the left mid to lower lung periphery and right lung base, concerning for a developing infection, edema or likely some combination there of. Electronically Signed   By: Lovena Le M.D.   On: 01/23/2020 07:02   DG Chest Portable 1 View  Result Date: 01/20/2020 CLINICAL  DATA:  Fever cough EXAM: PORTABLE CHEST 1 VIEW COMPARISON:  04/03/2019 FINDINGS: Post sternotomy changes. Left-sided pacing device as before. Cardiomegaly. Trace right-sided pleural effusion. No focal consolidation or pneumothorax. IMPRESSION: Cardiomegaly with trace right-sided pleural effusion. Electronically Signed   By: Donavan Foil M.D.   On: 01/20/2020 19:50   ECHOCARDIOGRAM COMPLETE  Result Date: 01/08/2020    ECHOCARDIOGRAM REPORT   Patient Name:   Paul Price Date of Exam: 01/08/2020 Medical Rec #:  [id number]      Height:       66.0 in Accession #:    [id number]     Weight:       186.0 lb Date of Birth:  1940/09/07       BSA:          1.939 m Patient Age:    52 years       BP:           152/74 mmHg Patient Gender: M              HR:           60 bpm. 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 CAD, Prior CABG, Abnormal ECG and Pacemaker,                 Arrythmias:Atrial Fibrillation and RBBB,                 Signs/Symptoms:Shortness of Breath and Chest Pain; Risk                 Factors:Hypertension, Dyslipidemia, Family History of Coronary                 Artery Disease, Former Smoker and Sleep Apnea.  Sonographer:    Deliah Boston RDCS Referring Phys: [place]  1. Left ventricular ejection fraction, by estimation, is 40 to 45%. The left ventricle has mildly decreased function. Left ventricular endocardial border not optimally defined to evaluate regional wall motion. Left ventricular diastolic function could not be evaluated. Elevated left ventricular end-diastolic pressure.  2. Right ventricular systolic function is normal. The right ventricular size is normal. There is mildly elevated pulmonary artery systolic pressure. The  estimated right ventricular systolic pressure is 29.9 mmHg.  3. Left atrial size was mildly dilated.  4. The mitral valve is normal in structure. Mild mitral valve regurgitation. No evidence of mitral stenosis.  5. The aortic valve is normal in structure. Aortic valve regurgitation is not visualized. No aortic stenosis is present.  6. Aortic dilatation noted. There is mild dilatation of the aortic root, measuring 39 mm.  7. The inferior vena cava is normal in size with <50% respiratory variability, suggesting right atrial pressure of 8 mmHg.  8. Compared to study of 2018, PASP has decreased from 61 to 15mmHg. FINDINGS  Left Ventricle: Left ventricular ejection fraction, by estimation, is 40 to 45%. The left ventricle has mildly decreased function. Left ventricular endocardial border not optimally defined to evaluate regional wall motion. The left ventricular internal cavity size was normal in size. There is no left ventricular hypertrophy. Abnormal (paradoxical) septal motion, consistent with RV pacemaker. Left ventricular diastolic function could not be evaluated due to paced rhythm. Left ventricular diastolic function could not be evaluated. Elevated left ventricular end-diastolic pressure. Right Ventricle: The right ventricular size is normal. No increase in right ventricular wall thickness. Right ventricular systolic function is normal. There is mildly elevated pulmonary artery systolic pressure. The tricuspid regurgitant velocity is 2.88  m/s, and with an assumed right atrial pressure of 8 mmHg, the estimated right ventricular systolic pressure is 37.1 mmHg. Left Atrium: Left atrial size was mildly dilated. Right Atrium: Right atrial size was normal in size. Pericardium: There is no evidence of pericardial effusion. Mitral Valve: The mitral valve is normal in structure. Mild mitral annular calcification. Mild mitral valve regurgitation. No evidence of mitral valve stenosis. Tricuspid Valve: The tricuspid valve  is normal in structure. Tricuspid valve regurgitation is mild . No evidence of tricuspid stenosis. Aortic Valve: The aortic valve is normal in structure. Aortic valve regurgitation is not visualized. No aortic stenosis is present. Pulmonic Valve: The pulmonic valve was normal in structure. Pulmonic valve regurgitation is trivial. No evidence of pulmonic stenosis. Aorta: Aortic dilatation noted. There is mild dilatation of the aortic root, measuring 39 mm. Venous: The inferior vena cava is normal in size with less than 50% respiratory variability, suggesting right atrial pressure of 8 mmHg. IAS/Shunts: No atrial level shunt detected by color  flow Doppler. Additional Comments: A pacer wire is visualized.  LEFT VENTRICLE PLAX 2D LVIDd:         5.20 cm  Diastology LVIDs:         4.40 cm  LV e' medial:    4.68 cm/s LV PW:         1.10 cm  LV E/e' medial:  21.3 LV IVS:        0.80 cm  LV e' lateral:   6.92 cm/s LVOT diam:     2.20 cm  LV E/e' lateral: 14.4 LV SV:         67 LV SV Index:   34 LVOT Area:     3.80 cm  RIGHT VENTRICLE TAPSE (M-mode): 1.6 cm LEFT ATRIUM             Index       RIGHT ATRIUM           Index LA diam:        4.50 cm 2.32 cm/m  RA Area:     25.80 cm LA Vol (A2C):   68.9 ml 35.53 ml/m RA Volume:   82.70 ml  42.65 ml/m LA Vol (A4C):   64.4 ml 33.21 ml/m LA Biplane Vol: 72.6 ml 37.44 ml/m  AORTIC VALVE LVOT Vmax:   79.20 cm/s LVOT Vmean:  50.500 cm/s LVOT VTI:    0.175 m  AORTA Ao Root diam: 3.90 cm Ao Asc diam:  3.40 cm MITRAL VALVE               TRICUSPID VALVE MV Area (PHT)  cm         TR Peak grad:   33.2 mmHg MV Decel Time: 216 msec    TR Vmax:        288.00 cm/s MR Peak grad: 115.8 mmHg MR Mean grad: 71.0 mmHg    SHUNTS MR Vmax:      538.00 cm/s  Systemic VTI:  0.18 m MR Vmean:     395.0 cm/s   Systemic Diam: 2.20 cm MV E velocity: 99.50 cm/s MV A velocity: 63.40 cm/s MV E/A ratio:  1.57 Fransico Him MD Electronically signed by Fransico Him MD Signature Date/Time: 01/08/2020/1:34:37 PM     Final    CUP PACEART REMOTE DEVICE CHECK  Result Date: 01/23/2020 Scheduled remote reviewed. Normal device function.  1 NSVT lasting 7 beats w/ rate 170's bpm Next remote 91 days. HB   Micro Results    Recent Results (from the past 240 hour(s))  SARS CORONAVIRUS 2 (TAT 6-24 HRS) Nasopharyngeal Nasopharyngeal Swab     Status: Abnormal   Collection Time: 01/20/20  6:32 PM   Specimen: Nasopharyngeal Swab  Result Value Ref Range Status   SARS Coronavirus 2 POSITIVE (A) NEGATIVE Final    Comment: (NOTE) SARS-CoV-2 target nucleic acids are DETECTED.  The SARS-CoV-2 RNA is generally detectable in upper and lower respiratory specimens during the acute phase of infection. Positive results are indicative of the presence of SARS-CoV-2 RNA. Clinical correlation with patient history and other diagnostic information is  necessary to determine patient infection status. Positive results do not rule out bacterial infection or co-infection with other viruses.  The expected result is Negative.  Fact Sheet for Patients: SugarRoll.be  Fact Sheet for Healthcare Providers: https://www.woods-mathews.com/  This test is not yet approved or cleared by the [place] FDA and  has been authorized for detection and/or diagnosis of SARS-CoV-2 by FDA under an Emergency Use Authorization (EUA). This  EUA will remain  in effect (meaning this test can be used) for the duration of the COVID-19 declaration under Section 564(b)(1) of the Act, 21 U. S.C. section 360bbb-3(b)(1), unless the authorization is terminated or revoked sooner.   Performed at [facility], [place] [address], [place], [place] [address]   Culture, blood (Routine X 2) w Reflex to ID Panel     Status: None (Preliminary result)   Collection Time: 01/21/20  8:28 AM   Specimen: BLOOD RIGHT HAND  Result Value Ref Range Status   Specimen Description BLOOD RIGHT HAND  Final   Special Requests    Final    BOTTLES DRAWN AEROBIC AND ANAEROBIC Blood Culture results may not be optimal due to an inadequate volume of blood received in culture bottles   Culture   Final    NO GROWTH 4 DAYS Performed at [facility], [place] [address] [place], [place] [address]    Report Status PENDING  Incomplete    Today   Subjective    Dontrey Rizor today has no headache,no chest abdominal pain,no new weakness tingling or numbness, feels much better wants to go home today.     Objective   Blood pressure (!) 156/77, pulse 74, temperature 97.7 F (36.5 C), resp. rate 16, height 5\' 7"  (1.702 m), weight 83.5 kg, SpO2 95 %.  No intake or output data in the 24 hours ending 01/26/20 0959  Exam  Awake Alert, No new F.N deficits, Normal affect [place].AT,PERRAL Supple Neck,No JVD, No cervical lymphadenopathy appriciated.  Symmetrical Chest wall movement, Good air movement bilaterally, CTAB RRR,No Gallops,Rubs or new Murmurs, No Parasternal Heave +ve B.Sounds, Abd Soft, Non tender, No organomegaly appriciated, No rebound -guarding or rigidity. No Cyanosis, Clubbing or edema, No new Rash or bruise   Data Review   CBC w Diff:  Lab Results  Component Value Date   WBC 10.7 (H) 01/26/2020   HGB 7.9 (L) 01/26/2020   HGB 7.7 (L) 10/20/2019   HCT 25.4 (L) 01/26/2020   HCT 39.9 10/01/2018   PLT 142 (L) 01/26/2020   PLT 146 (L) 10/20/2019   LYMPHOPCT 4 01/26/2020   MONOPCT 3 01/26/2020   EOSPCT 0 01/26/2020   BASOPCT 0 01/26/2020    CMP:  Lab Results  Component Value Date   NA 142 01/26/2020   NA 146 (H) 11/22/2016   K 4.2 01/26/2020   CL 111 01/26/2020   CO2 19 (L) 01/26/2020   BUN 78 (H) 01/26/2020   BUN 44 (H) 11/22/2016   CREATININE 3.71 (H) 01/26/2020   CREATININE 3.18 (HH) 10/20/2019   CREATININE 1.79 (H) 10/27/2015   PROT 5.2 (L) 01/26/2020   ALBUMIN 2.5 (L) 01/26/2020   BILITOT 1.0 01/26/2020   BILITOT 0.5 10/20/2019   ALKPHOS 132 (H) 01/26/2020   AST 38 01/26/2020   AST 18  10/20/2019   ALT 24 01/26/2020   ALT 12 10/20/2019  .   Total Time in preparing paper work, data evaluation and todays exam - 75 minutes  Lala Lund M.D on 01/26/2020 at 9:59 AM  [facility]

## 2020-01-27 ENCOUNTER — Encounter (HOSPITAL_COMMUNITY): Payer: Medicare Other

## 2020-01-29 DIAGNOSIS — J1282 Pneumonia due to coronavirus disease 2019: Secondary | ICD-10-CM | POA: Diagnosis not present

## 2020-01-29 DIAGNOSIS — I1311 Hypertensive heart and chronic kidney disease without heart failure, with stage 5 chronic kidney disease, or end stage renal disease: Secondary | ICD-10-CM | POA: Diagnosis not present

## 2020-01-29 DIAGNOSIS — J449 Chronic obstructive pulmonary disease, unspecified: Secondary | ICD-10-CM | POA: Diagnosis not present

## 2020-01-29 DIAGNOSIS — N185 Chronic kidney disease, stage 5: Secondary | ICD-10-CM | POA: Diagnosis not present

## 2020-01-29 DIAGNOSIS — I480 Paroxysmal atrial fibrillation: Secondary | ICD-10-CM | POA: Diagnosis not present

## 2020-01-29 DIAGNOSIS — U071 COVID-19: Secondary | ICD-10-CM | POA: Diagnosis not present

## 2020-01-29 DIAGNOSIS — D62 Acute posthemorrhagic anemia: Secondary | ICD-10-CM | POA: Diagnosis not present

## 2020-01-29 DIAGNOSIS — I5022 Chronic systolic (congestive) heart failure: Secondary | ICD-10-CM | POA: Diagnosis not present

## 2020-01-29 DIAGNOSIS — R2689 Other abnormalities of gait and mobility: Secondary | ICD-10-CM | POA: Diagnosis not present

## 2020-01-29 DIAGNOSIS — K922 Gastrointestinal hemorrhage, unspecified: Secondary | ICD-10-CM | POA: Diagnosis not present

## 2020-02-01 ENCOUNTER — Emergency Department (HOSPITAL_COMMUNITY): Payer: Medicare Other

## 2020-02-01 ENCOUNTER — Inpatient Hospital Stay (HOSPITAL_COMMUNITY)
Admission: EM | Admit: 2020-02-01 | Discharge: 2020-02-12 | DRG: 673 | Disposition: A | Discharge status: Home Health | Payer: Medicare Other | Attending: Internal Medicine | Admitting: Internal Medicine

## 2020-02-01 DIAGNOSIS — U071 COVID-19: Secondary | ICD-10-CM | POA: Diagnosis present

## 2020-02-01 DIAGNOSIS — E860 Dehydration: Secondary | ICD-10-CM | POA: Diagnosis not present

## 2020-02-01 DIAGNOSIS — D509 Iron deficiency anemia, unspecified: Secondary | ICD-10-CM | POA: Diagnosis present

## 2020-02-01 DIAGNOSIS — E8809 Other disorders of plasma-protein metabolism, not elsewhere classified: Secondary | ICD-10-CM | POA: Diagnosis not present

## 2020-02-01 DIAGNOSIS — Y92239 Unspecified place in hospital as the place of occurrence of the external cause: Secondary | ICD-10-CM | POA: Diagnosis present

## 2020-02-01 DIAGNOSIS — S40021A Contusion of right upper arm, initial encounter: Secondary | ICD-10-CM | POA: Diagnosis present

## 2020-02-01 DIAGNOSIS — J90 Pleural effusion, not elsewhere classified: Secondary | ICD-10-CM | POA: Diagnosis not present

## 2020-02-01 DIAGNOSIS — I5022 Chronic systolic (congestive) heart failure: Secondary | ICD-10-CM | POA: Diagnosis present

## 2020-02-01 DIAGNOSIS — I081 Rheumatic disorders of both mitral and tricuspid valves: Secondary | ICD-10-CM | POA: Diagnosis not present

## 2020-02-01 DIAGNOSIS — I248 Other forms of acute ischemic heart disease: Secondary | ICD-10-CM | POA: Diagnosis present

## 2020-02-01 DIAGNOSIS — I5043 Acute on chronic combined systolic (congestive) and diastolic (congestive) heart failure: Secondary | ICD-10-CM | POA: Diagnosis not present

## 2020-02-01 DIAGNOSIS — Q211 Atrial septal defect: Secondary | ICD-10-CM | POA: Diagnosis not present

## 2020-02-01 DIAGNOSIS — R7989 Other specified abnormal findings of blood chemistry: Secondary | ICD-10-CM | POA: Diagnosis present

## 2020-02-01 DIAGNOSIS — N185 Chronic kidney disease, stage 5: Secondary | ICD-10-CM

## 2020-02-01 DIAGNOSIS — R0602 Shortness of breath: Secondary | ICD-10-CM

## 2020-02-01 DIAGNOSIS — E876 Hypokalemia: Secondary | ICD-10-CM | POA: Diagnosis not present

## 2020-02-01 DIAGNOSIS — I340 Nonrheumatic mitral (valve) insufficiency: Secondary | ICD-10-CM | POA: Diagnosis not present

## 2020-02-01 DIAGNOSIS — X58XXXA Exposure to other specified factors, initial encounter: Secondary | ICD-10-CM | POA: Diagnosis present

## 2020-02-01 DIAGNOSIS — I10 Essential (primary) hypertension: Secondary | ICD-10-CM | POA: Diagnosis not present

## 2020-02-01 DIAGNOSIS — D631 Anemia in chronic kidney disease: Secondary | ICD-10-CM | POA: Diagnosis not present

## 2020-02-01 DIAGNOSIS — R778 Other specified abnormalities of plasma proteins: Secondary | ICD-10-CM | POA: Diagnosis not present

## 2020-02-01 DIAGNOSIS — Z6828 Body mass index (BMI) 28.0-28.9, adult: Secondary | ICD-10-CM

## 2020-02-01 DIAGNOSIS — I132 Hypertensive heart and chronic kidney disease with heart failure and with stage 5 chronic kidney disease, or end stage renal disease: Secondary | ICD-10-CM | POA: Diagnosis present

## 2020-02-01 DIAGNOSIS — E669 Obesity, unspecified: Secondary | ICD-10-CM | POA: Diagnosis present

## 2020-02-01 DIAGNOSIS — G4733 Obstructive sleep apnea (adult) (pediatric): Secondary | ICD-10-CM

## 2020-02-01 DIAGNOSIS — Z743 Need for continuous supervision: Secondary | ICD-10-CM | POA: Diagnosis not present

## 2020-02-01 DIAGNOSIS — I5042 Chronic combined systolic (congestive) and diastolic (congestive) heart failure: Secondary | ICD-10-CM | POA: Diagnosis not present

## 2020-02-01 DIAGNOSIS — E7800 Pure hypercholesterolemia, unspecified: Secondary | ICD-10-CM | POA: Diagnosis present

## 2020-02-01 DIAGNOSIS — B9561 Methicillin susceptible Staphylococcus aureus infection as the cause of diseases classified elsewhere: Secondary | ICD-10-CM | POA: Diagnosis not present

## 2020-02-01 DIAGNOSIS — I480 Paroxysmal atrial fibrillation: Secondary | ICD-10-CM | POA: Diagnosis present

## 2020-02-01 DIAGNOSIS — Z992 Dependence on renal dialysis: Secondary | ICD-10-CM

## 2020-02-01 DIAGNOSIS — Z87891 Personal history of nicotine dependence: Secondary | ICD-10-CM

## 2020-02-01 DIAGNOSIS — K5900 Constipation, unspecified: Secondary | ICD-10-CM | POA: Diagnosis present

## 2020-02-01 DIAGNOSIS — I513 Intracardiac thrombosis, not elsewhere classified: Secondary | ICD-10-CM | POA: Diagnosis not present

## 2020-02-01 DIAGNOSIS — E871 Hypo-osmolality and hyponatremia: Secondary | ICD-10-CM | POA: Diagnosis not present

## 2020-02-01 DIAGNOSIS — N179 Acute kidney failure, unspecified: Secondary | ICD-10-CM | POA: Diagnosis not present

## 2020-02-01 DIAGNOSIS — Z955 Presence of coronary angioplasty implant and graft: Secondary | ICD-10-CM

## 2020-02-01 DIAGNOSIS — R627 Adult failure to thrive: Secondary | ICD-10-CM | POA: Diagnosis not present

## 2020-02-01 DIAGNOSIS — E870 Hyperosmolality and hypernatremia: Secondary | ICD-10-CM | POA: Diagnosis present

## 2020-02-01 DIAGNOSIS — N390 Urinary tract infection, site not specified: Secondary | ICD-10-CM | POA: Diagnosis not present

## 2020-02-01 DIAGNOSIS — I361 Nonrheumatic tricuspid (valve) insufficiency: Secondary | ICD-10-CM | POA: Diagnosis not present

## 2020-02-01 DIAGNOSIS — R6889 Other general symptoms and signs: Secondary | ICD-10-CM | POA: Diagnosis not present

## 2020-02-01 DIAGNOSIS — Z4901 Encounter for fitting and adjustment of extracorporeal dialysis catheter: Secondary | ICD-10-CM | POA: Diagnosis not present

## 2020-02-01 DIAGNOSIS — R112 Nausea with vomiting, unspecified: Secondary | ICD-10-CM | POA: Diagnosis present

## 2020-02-01 DIAGNOSIS — R630 Anorexia: Secondary | ICD-10-CM | POA: Diagnosis present

## 2020-02-01 DIAGNOSIS — J811 Chronic pulmonary edema: Secondary | ICD-10-CM | POA: Diagnosis not present

## 2020-02-01 DIAGNOSIS — K8020 Calculus of gallbladder without cholecystitis without obstruction: Secondary | ICD-10-CM | POA: Diagnosis not present

## 2020-02-01 DIAGNOSIS — Z7901 Long term (current) use of anticoagulants: Secondary | ICD-10-CM

## 2020-02-01 DIAGNOSIS — N186 End stage renal disease: Secondary | ICD-10-CM | POA: Diagnosis not present

## 2020-02-01 DIAGNOSIS — I2510 Atherosclerotic heart disease of native coronary artery without angina pectoris: Secondary | ICD-10-CM | POA: Diagnosis not present

## 2020-02-01 DIAGNOSIS — Z888 Allergy status to other drugs, medicaments and biological substances status: Secondary | ICD-10-CM

## 2020-02-01 DIAGNOSIS — K219 Gastro-esophageal reflux disease without esophagitis: Secondary | ICD-10-CM | POA: Diagnosis present

## 2020-02-01 DIAGNOSIS — Z950 Presence of cardiac pacemaker: Secondary | ICD-10-CM

## 2020-02-01 DIAGNOSIS — M160 Bilateral primary osteoarthritis of hip: Secondary | ICD-10-CM | POA: Diagnosis not present

## 2020-02-01 DIAGNOSIS — I441 Atrioventricular block, second degree: Secondary | ICD-10-CM | POA: Diagnosis present

## 2020-02-01 DIAGNOSIS — R059 Cough, unspecified: Secondary | ICD-10-CM | POA: Diagnosis not present

## 2020-02-01 DIAGNOSIS — Z905 Acquired absence of kidney: Secondary | ICD-10-CM | POA: Diagnosis not present

## 2020-02-01 DIAGNOSIS — E785 Hyperlipidemia, unspecified: Secondary | ICD-10-CM | POA: Diagnosis not present

## 2020-02-01 DIAGNOSIS — Z951 Presence of aortocoronary bypass graft: Secondary | ICD-10-CM

## 2020-02-01 DIAGNOSIS — R1110 Vomiting, unspecified: Secondary | ICD-10-CM | POA: Diagnosis not present

## 2020-02-01 DIAGNOSIS — I517 Cardiomegaly: Secondary | ICD-10-CM | POA: Diagnosis not present

## 2020-02-01 DIAGNOSIS — Z79899 Other long term (current) drug therapy: Secondary | ICD-10-CM

## 2020-02-01 DIAGNOSIS — N400 Enlarged prostate without lower urinary tract symptoms: Secondary | ICD-10-CM | POA: Diagnosis present

## 2020-02-01 DIAGNOSIS — I120 Hypertensive chronic kidney disease with stage 5 chronic kidney disease or end stage renal disease: Secondary | ICD-10-CM | POA: Diagnosis not present

## 2020-02-01 DIAGNOSIS — Z85528 Personal history of other malignant neoplasm of kidney: Secondary | ICD-10-CM | POA: Diagnosis not present

## 2020-02-01 DIAGNOSIS — E861 Hypovolemia: Secondary | ICD-10-CM | POA: Diagnosis not present

## 2020-02-01 DIAGNOSIS — Z9889 Other specified postprocedural states: Secondary | ICD-10-CM

## 2020-02-01 DIAGNOSIS — R7881 Bacteremia: Secondary | ICD-10-CM | POA: Diagnosis not present

## 2020-02-01 DIAGNOSIS — R32 Unspecified urinary incontinence: Secondary | ICD-10-CM | POA: Diagnosis not present

## 2020-02-01 DIAGNOSIS — Z86718 Personal history of other venous thrombosis and embolism: Secondary | ICD-10-CM

## 2020-02-01 DIAGNOSIS — Z8249 Family history of ischemic heart disease and other diseases of the circulatory system: Secondary | ICD-10-CM

## 2020-02-01 LAB — CBC WITH DIFFERENTIAL/PLATELET
Abs Immature Granulocytes: 0.48 10*3/uL — ABNORMAL HIGH (ref 0.00–0.07)
Basophils Absolute: 0 10*3/uL (ref 0.0–0.1)
Basophils Relative: 0 %
Eosinophils Absolute: 0.1 10*3/uL (ref 0.0–0.5)
Eosinophils Relative: 0 %
HCT: 31.2 % — ABNORMAL LOW (ref 39.0–52.0)
Hemoglobin: 9.2 g/dL — ABNORMAL LOW (ref 13.0–17.0)
Immature Granulocytes: 3 %
Lymphocytes Relative: 4 %
Lymphs Abs: 0.7 10*3/uL (ref 0.7–4.0)
MCH: 24.8 pg — ABNORMAL LOW (ref 26.0–34.0)
MCHC: 29.5 g/dL — ABNORMAL LOW (ref 30.0–36.0)
MCV: 84.1 fL (ref 80.0–100.0)
Monocytes Absolute: 1 10*3/uL (ref 0.1–1.0)
Monocytes Relative: 6 %
Neutro Abs: 14.5 10*3/uL — ABNORMAL HIGH (ref 1.7–7.7)
Neutrophils Relative %: 87 %
Platelets: 249 10*3/uL (ref 150–400)
RBC: 3.71 MIL/uL — ABNORMAL LOW (ref 4.22–5.81)
RDW: 19.8 % — ABNORMAL HIGH (ref 11.5–15.5)
WBC: 16.8 10*3/uL — ABNORMAL HIGH (ref 4.0–10.5)
nRBC: 0 % (ref 0.0–0.2)

## 2020-02-01 LAB — COMPREHENSIVE METABOLIC PANEL
ALT: 24 U/L (ref 0–44)
AST: 28 U/L (ref 15–41)
Albumin: 2.4 g/dL — ABNORMAL LOW (ref 3.5–5.0)
Alkaline Phosphatase: 126 U/L (ref 38–126)
Anion gap: 11 (ref 5–15)
BUN: 66 mg/dL — ABNORMAL HIGH (ref 8–23)
CO2: 24 mmol/L (ref 22–32)
Calcium: 8.4 mg/dL — ABNORMAL LOW (ref 8.9–10.3)
Chloride: 117 mmol/L — ABNORMAL HIGH (ref 98–111)
Creatinine, Ser: 4.55 mg/dL — ABNORMAL HIGH (ref 0.61–1.24)
GFR, Estimated: 12 mL/min — ABNORMAL LOW (ref >60)
Glucose, Bld: 145 mg/dL — ABNORMAL HIGH (ref 70–99)
Potassium: 3.8 mmol/L (ref 3.5–5.1)
Sodium: 152 mmol/L — ABNORMAL HIGH (ref 135–145)
Total Bilirubin: 0.8 mg/dL (ref 0.3–1.2)
Total Protein: 5.9 g/dL — ABNORMAL LOW (ref 6.5–8.1)

## 2020-02-01 LAB — URINALYSIS, ROUTINE W REFLEX MICROSCOPIC
Bilirubin Urine: NEGATIVE
Glucose, UA: 50 mg/dL — AB
Ketones, ur: NEGATIVE mg/dL
Nitrite: POSITIVE — AB
Protein, ur: >=300 mg/dL — AB
RBC / HPF: >50 RBC/hpf — ABNORMAL HIGH (ref 0–5)
Specific Gravity, Urine: 1.012 (ref 1.005–1.030)
WBC, UA: >50 WBC/hpf — ABNORMAL HIGH (ref 0–5)
pH: 6 (ref 5.0–8.0)

## 2020-02-01 LAB — CBG MONITORING, ED: Glucose-Capillary: 135 mg/dL — ABNORMAL HIGH (ref 70–99)

## 2020-02-01 LAB — TROPONIN I (HIGH SENSITIVITY)
Troponin I (High Sensitivity): 90 ng/L — ABNORMAL HIGH (ref <18)
Troponin I (High Sensitivity): 82 ng/L — ABNORMAL HIGH (ref <18)

## 2020-02-01 MED ORDER — METOPROLOL SUCCINATE ER 25 MG PO TB24
25.0000 mg | ORAL_TABLET | Freq: Every day | ORAL | Status: DC
Start: 2020-02-02 — End: 2020-02-12
  Administered 2020-02-02 – 2020-02-12 (×11): 25 mg via ORAL
  Filled 2020-02-01 (×11): qty 1

## 2020-02-01 MED ORDER — SODIUM BICARBONATE 650 MG PO TABS
650.0000 mg | ORAL_TABLET | Freq: Three times a day (TID) | ORAL | Status: DC
  Administered 2020-02-01 – 2020-02-08 (×22): 650 mg via ORAL
  Filled 2020-02-01 (×23): qty 1

## 2020-02-01 MED ORDER — B COMPLEX PO TABS: 1.0000 | ORAL_TABLET | Freq: Every day | ORAL | Status: DC

## 2020-02-01 MED ORDER — RIVAROXABAN 15 MG PO TABS: 15.0000 mg | ORAL_TABLET | Freq: Every day | ORAL | Status: DC

## 2020-02-01 MED ORDER — DEXTROSE 5 % IV SOLN: Freq: Once | INTRAVENOUS | Status: AC

## 2020-02-01 MED ORDER — ACETAMINOPHEN 650 MG RE SUPP: 650.0000 mg | Freq: Four times a day (QID) | RECTAL | Status: DC | PRN

## 2020-02-01 MED ORDER — ONDANSETRON HCL 4 MG PO TABS: 4.0000 mg | ORAL_TABLET | Freq: Four times a day (QID) | ORAL | Status: DC | PRN

## 2020-02-01 MED ORDER — INSULIN ASPART 100 UNIT/ML ~~LOC~~ SOLN
0.0000 [IU] | Freq: Three times a day (TID) | SUBCUTANEOUS | Status: DC
  Administered 2020-02-02 – 2020-02-03 (×3): 2 [IU] via SUBCUTANEOUS
  Administered 2020-02-03: 8 [IU] via SUBCUTANEOUS
  Administered 2020-02-04: 2 [IU] via SUBCUTANEOUS

## 2020-02-01 MED ORDER — FERROUS SULFATE 325 (65 FE) MG PO TABS
325.0000 mg | ORAL_TABLET | Freq: Two times a day (BID) | ORAL | Status: DC
  Administered 2020-02-02 – 2020-02-06 (×9): 325 mg via ORAL
  Filled 2020-02-01 (×9): qty 1

## 2020-02-01 MED ORDER — ACETAMINOPHEN 325 MG PO TABS
650.0000 mg | ORAL_TABLET | Freq: Four times a day (QID) | ORAL | Status: DC | PRN
  Administered 2020-02-05 – 2020-02-11 (×2): 650 mg via ORAL
  Filled 2020-02-01 (×2): qty 2

## 2020-02-01 MED ORDER — POLYETHYLENE GLYCOL 3350 17 G PO PACK
17.0000 g | PACK | Freq: Every day | ORAL | Status: DC | PRN
  Administered 2020-02-07 – 2020-02-08 (×2): 17 g via ORAL
  Filled 2020-02-01: qty 1

## 2020-02-01 MED ORDER — INSULIN ASPART 100 UNIT/ML ~~LOC~~ SOLN: 0.0000 [IU] | Freq: Every day | SUBCUTANEOUS | Status: DC

## 2020-02-01 MED ORDER — TAMSULOSIN HCL 0.4 MG PO CAPS
0.4000 mg | ORAL_CAPSULE | Freq: Every day | ORAL | Status: DC
  Administered 2020-02-02 – 2020-02-12 (×11): 0.4 mg via ORAL
  Filled 2020-02-01 (×11): qty 1

## 2020-02-01 MED ORDER — AMLODIPINE BESYLATE 2.5 MG PO TABS
2.5000 mg | ORAL_TABLET | Freq: Every day | ORAL | Status: DC
  Administered 2020-02-02 – 2020-02-12 (×11): 2.5 mg via ORAL
  Filled 2020-02-01 (×11): qty 1

## 2020-02-01 MED ORDER — ONDANSETRON HCL 4 MG/2ML IJ SOLN
4.0000 mg | Freq: Four times a day (QID) | INTRAMUSCULAR | Status: DC | PRN
  Administered 2020-02-03 – 2020-02-06 (×3): 4 mg via INTRAVENOUS
  Filled 2020-02-01 (×4): qty 2

## 2020-02-01 MED ORDER — PROMETHAZINE HCL 25 MG/ML IJ SOLN: 12.5000 mg | Freq: Four times a day (QID) | INTRAMUSCULAR | Status: DC | PRN

## 2020-02-01 MED ORDER — OXYCODONE HCL 5 MG PO TABS
5.0000 mg | ORAL_TABLET | ORAL | Status: DC | PRN
  Administered 2020-02-05 – 2020-02-10 (×4): 5 mg via ORAL
  Filled 2020-02-01 (×4): qty 1

## 2020-02-01 MED ORDER — DEXTROSE 5 % IV SOLN: INTRAVENOUS | Status: DC

## 2020-02-01 MED ORDER — PANTOPRAZOLE SODIUM 40 MG PO TBEC
40.0000 mg | DELAYED_RELEASE_TABLET | Freq: Two times a day (BID) | ORAL | Status: DC
  Administered 2020-02-01 – 2020-02-12 (×21): 40 mg via ORAL
  Filled 2020-02-01 (×21): qty 1

## 2020-02-01 MED ORDER — ATORVASTATIN CALCIUM 10 MG PO TABS
20.0000 mg | ORAL_TABLET | Freq: Every day | ORAL | Status: DC
  Administered 2020-02-02 – 2020-02-12 (×11): 20 mg via ORAL
  Filled 2020-02-01 (×11): qty 2

## 2020-02-01 NOTE — ED Notes (Signed)
Pt spilled urine using urinal, linens changed at this time.

## 2020-02-01 NOTE — H&P (Signed)
TRH H&P    Patient Demographics:    Paul Price, is a 80 y.o. male  MRN: [id number]  DOB - Nov 17, 1940  Admit Date - 02/01/2020  Referring MD/NP/PA: Ayesha Rumpf  Outpatient Primary MD for the patient is [name], MD  Patient coming from: Home  Chief complaint-dyspnea/nausea   HPI:    Paul Price  is a 80 y.o. male, with history of paroxysmal atrial fibrillation, chronic kidney disease, hypertension, hyperlipidemia, BPH and more presents to the ED with a complaint of shortness of breath and nausea.  Patient was just discharged 01/26/2020 after an admission for upper GI bleeding, AKI, COVID-pneumonia.  Patient finished a course of remdesivir and steroids.  He did not have an oxygen requirement at that time but he did have infiltrates on repeat chest x-ray.  Patient was seen by GI they advised holding Xarelto until 01/31/2020.  Patient did not resume Xarelto at home.  Patient was discharged with a creatinine level of 3.71.  Today he reports that the "felt like the world was closing in" on him.  Patient reports that he has been throwing up every day whenever he tries to take in any liquid.  He has not tried to eat any solid foods.  He has been able to walk around with his walker, he does not feel dizzy, he does feel fatigued and weak.  Patient reports that when he throws up he sees mucus, sometimes with blood.  Patient reports when he eats solid food he has diarrhea, but he has not been eating any solid foods and this has not been a problem recently.  Patient's last normal meal was 2 weeks ago, last normal bowel movement was 2 weeks ago.  Patient has noted decreased urine output which is consistent with virtually no p.o. intake.  Patient does have blood on his lips in the ED, he reports this is from biting his lips.  Patient has no other complaints at this time.  In the ED Temp 98, heart rate 61, respiratory rate 14-20, blood  pressure 151/58, satting at 95-100% on room air White blood cell, 16.8, hemoglobin 9.2 Chemistry panel reveals a BUN of 66, creatinine of 4.55 Bicarb is normal at 24 Slight hyperglycemia at 145 Albumin is low at 2.4 Initial elevated troponin of 90 EKG shows a heart rate of 64, ventricular paced Chest x-ray shows improved aeration on left side, cannot exclude residual opacities representing scarring versus pneumonia D5 started at 50 mL/h Urine culture pending UA pending Admission requested for AKI       Review of systems:    In addition to the HPI above,  No Fever-chills, No Headache, No changes with Vision or hearing, Endorses feeling choked when swallowing liquids No Chest pain, Cough or Shortness of Breath, No Abdominal pain, admits to nausea/vomiting/Diarrhea No Blood in stool or Urine, admits to blood in emesis No dysuria, No new skin rashes or bruises, No new joints pains-aches,  No new weakness, tingling, numbness in any extremity, No recent weight gain or loss, No polyuria, polydypsia or polyphagia, No  significant Mental Stressors.  All other systems reviewed and are negative.    Past History of the following :    Past Medical History:  Diagnosis Date  . Arthritis    "shoulders" (09/07/2016)  . Atrial fibrillation ([place])   . BPH (benign prostatic hyperplasia)   . Chronic kidney disease (CKD), stage III (moderate) (HCC)   . Coronary artery disease   . DDD (degenerative disc disease), cervical   . DVT (deep venous thrombosis) ([place]) 12/2013   LLE  . GERD (gastroesophageal reflux disease)   . Gout   . High cholesterol   . History of blood transfusion 09/06/2016   2 u PRBC  . History of scarlet fever 1940s  . History of stress test 06/2011   No significant ischemia, this is a low risk scan. Clinical correlation recommended Abnormal myocardial perfusion study.  Marland Kitchen Hx of echocardiogram 05/2009   EF 40-45%, he did have mild annular calcification with  mild-to-moderate MR and mild TR as well as aortic valve sclerosis. Estimated RV systolic pressure was 21 mm.  . Hypertension   . Iron deficiency anemia   . Kidney failure   . 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 Neck pain    "not chronic" (09/07/2016)  . Obesity   . OSA on CPAP   . RBBB   . Renal insufficiency   . Right renal mass   . Spinal stenosis of lumbar region       Past Surgical History:  Procedure Laterality Date  . APPENDECTOMY    . CARDIAC CATHETERIZATION  2009   he was found to have a atretic LIMA graft to his LAD and had a patent vein graft supplying his diagonal vessel. At that time, he had 70% narrowing in his LAD beyond a diagonal vessel which was initially treated medically.  . COLONOSCOPY WITH PROPOFOL Left 09/09/2016   Procedure: COLONOSCOPY WITH PROPOFOL;  Surgeon: Arta Silence, MD;  Location: [facility];  Service: Endoscopy;  Laterality: Left;  . CORONARY ANGIOPLASTY WITH STENT PLACEMENT  April 2012   LAD DES  . CORONARY ARTERY BYPASS GRAFT  1992   with LIMA to the LAD and diagonal.  . CYSTOSCOPY N/A 03/31/2019   Procedure: CYSTOSCOPY FLEXIBLE;  Surgeon: Lucas Mallow, MD;  Location: [facility];  Service: Urology;  Laterality: N/A;  . CYSTOSCOPY WITH URETHRAL DILATATION N/A 05/21/2019   Procedure: CYSTOSCOPY WITH URETHRAL BALLOON DILATATION;  Surgeon: Lucas Mallow, MD;  Location: [facility];  Service: Urology;  Laterality: N/A;  . ESOPHAGOGASTRODUODENOSCOPY N/A 11/16/2019   Procedure: ESOPHAGOGASTRODUODENOSCOPY (EGD);  Surgeon: Wilford Corner, MD;  Location: [facility];  Service: Endoscopy;  Laterality: N/A;  . ESOPHAGOGASTRODUODENOSCOPY (EGD) WITH PROPOFOL Left 09/08/2016   Procedure: ESOPHAGOGASTRODUODENOSCOPY (EGD) WITH PROPOFOL;  Surgeon: Ronnette Juniper, MD;  Location: [facility];  Service: Gastroenterology;  Laterality: Left;  . GIVENS CAPSULE STUDY N/A 11/16/2019   Procedure: GIVENS CAPSULE STUDY;  Surgeon: Wilford Corner, MD;  Location:  [facility];  Service: Endoscopy;  Laterality: N/A;  . INGUINAL HERNIA REPAIR     "don't remeimber which side"  . LAPAROSCOPIC NEPHRECTOMY Right 03/31/2019   Procedure: RIGHT  HAND ASSIST LAPAROSCOPIC NEPHRECTOMY;  Surgeon: Lucas Mallow, MD;  Location: [facility];  Service: Urology;  Laterality: Right;  . PACEMAKER IMPLANT N/A 12/25/2016   Procedure: PACEMAKER IMPLANT;  Surgeon: Deboraha Sprang, MD;  Location: [facility];  Service: Cardiovascular;  Laterality: N/A;  . TONSILLECTOMY  Social History:      Social History   Tobacco Use  . Smoking status: Former Smoker    Packs/day: 1.00    Years: 30.00    Pack years: 30.00    Types: Cigarettes  . Smokeless tobacco: Never Used  . Tobacco comment: quit ~ 1985  Substance Use Topics  . Alcohol use: Yes    Alcohol/week: 0.0 standard drinks    Comment: rare beer       Family History :     Family History  Problem Relation Age of Onset  . Heart disease Father       Home Medications:   Prior to Admission medications   Medication Sig Start Date End Date Taking? Authorizing Provider  acetaminophen (TYLENOL) 500 MG tablet Take 500-1,000 mg by mouth every 6 (six) hours as needed for mild pain, moderate pain or headache.     [provider]  allopurinol (ZYLOPRIM) 300 MG tablet Take 300 mg by mouth daily.    [provider]  amLODipine (NORVASC) 5 MG tablet Take 2.5 mg by mouth daily.     [provider]  atorvastatin (LIPITOR) 20 MG tablet TAKE 1 TABLET BY MOUTH DAILY Patient taking differently: Take 20 mg by mouth daily. 09/08/19   Troy Sine, MD  b complex vitamins tablet Take 1 tablet by mouth daily.    [provider]  docusate sodium (COLACE) 100 MG capsule Take 2 capsules (200 mg total) by mouth daily. 01/26/20   Thurnell Lose, MD  ferrous sulfate 325 (65 FE) MG tablet Take 1 tablet (325 mg total) by mouth 2 (two) times daily with a meal. 01/26/20   Thurnell Lose,  MD  furosemide (LASIX) 40 MG tablet Take 0.5 tablets (20 mg total) by mouth daily. 11/17/19   Geradine Girt, DO  metoprolol succinate (TOPROL-XL) 25 MG 24 hr tablet TAKE 1 TABLET BY MOUTH DAILY Patient taking differently: Take 25 mg by mouth daily. 01/19/20   Troy Sine, MD  pantoprazole (PROTONIX) 40 MG tablet Take 1 tablet (40 mg total) by mouth 2 (two) times daily. 01/26/20   Thurnell Lose, MD  Rivaroxaban (XARELTO) 15 MG TABS tablet Take 1 tablet (15 mg total) by mouth daily after supper. 02/01/20   Thurnell Lose, MD  sodium bicarbonate 650 MG tablet Take 1 tablet (650 mg total) by mouth 3 (three) times daily. 01/26/20   Thurnell Lose, MD  Tamsulosin HCl (FLOMAX) 0.4 MG CAPS Take 1 capsule (0.4 mg total) by mouth daily. 07/20/11   Chauncy Passy, MD     Allergies:     Allergies  Allergen Reactions  . Simvastatin Other (See Comments)    Abdominal cramps  . Nitrostat [Nitroglycerin] Other (See Comments)    Causes blood pressure to "bottom out"     Physical Exam:   Vitals  Blood pressure (!) 151/58, pulse 62, temperature 98 F (36.7 C), temperature source Oral, resp. rate 14, SpO2 95 %.  1.  General:  Resting comfortably in bed, no acute distress  2. Psychiatric: Mood and behavior normal for situation, alert and oriented x3, cooperative and pleasant with exam  3. Neurologic: Cranial nerves II through XII are grossly intact, moves all 4 extremities voluntarily, speech language are normal, no focal deficit on limited exam  4. HEENMT:  Head is atraumatic, normocephalic, pupils are reactive to light, neck is supple, trachea is midline, mucous membranes are very dry  5. Respiratory : Lungs are clear  to auscultation bilaterally without wheeze, rhonchi, crackles  6. Cardiovascular : Heart rate is normal, rhythm is regular, no murmurs rubs or gallops, no peripheral edema  7. Gastrointestinal:  Abdomen is soft, nondistended, nontender to palpation  8. Skin:  Skin  is warm dry and intact without acute lesions on limited exam  9.Musculoskeletal:  No calf tenderness, no acute deformity, no peripheral edema, peripheral pulses palpated    Data Review:    CBC Recent Labs  Lab 01/26/20 0350 02/01/20 1618  WBC 10.7* 16.8*  HGB 7.9* 9.2*  HCT 25.4* 31.2*  PLT 142* 249  MCV 82.7 84.1  MCH 25.7* 24.8*  MCHC 31.1 29.5*  RDW 19.6* 19.8*  LYMPHSABS 0.4* 0.7  MONOABS 0.3 1.0  EOSABS 0.0 0.1  BASOSABS 0.0 0.0   ------------------------------------------------------------------------------------------------------------------  Results for orders placed or performed during the hospital encounter of 02/01/20 (from the past 48 hour(s))  CBC with Differential     Status: Abnormal   Collection Time: 02/01/20  4:18 PM  Result Value Ref Range   WBC 16.8 (H) 4.0 - 10.5 K/uL   RBC 3.71 (L) 4.22 - 5.81 MIL/uL   Hemoglobin 9.2 (L) 13.0 - 17.0 g/dL   HCT 31.2 (L) 39.0 - 52.0 %   MCV 84.1 80.0 - 100.0 fL   MCH 24.8 (L) 26.0 - 34.0 pg   MCHC 29.5 (L) 30.0 - 36.0 g/dL   RDW 19.8 (H) 11.5 - 15.5 %   Platelets 249 150 - 400 K/uL   nRBC 0.0 0.0 - 0.2 %   Neutrophils Relative % 87 %   Neutro Abs 14.5 (H) 1.7 - 7.7 K/uL   Lymphocytes Relative 4 %   Lymphs Abs 0.7 0.7 - 4.0 K/uL   Monocytes Relative 6 %   Monocytes Absolute 1.0 0.1 - 1.0 K/uL   Eosinophils Relative 0 %   Eosinophils Absolute 0.1 0.0 - 0.5 K/uL   Basophils Relative 0 %   Basophils Absolute 0.0 0.0 - 0.1 K/uL   Immature Granulocytes 3 %   Abs Immature Granulocytes 0.48 (H) 0.00 - 0.07 K/uL    Comment: Performed at [facility], [address], [place], [place] [address]  Comprehensive metabolic panel     Status: Abnormal   Collection Time: 02/01/20  4:18 PM  Result Value Ref Range   Sodium 152 (H) 135 - 145 mmol/L   Potassium 3.8 3.5 - 5.1 mmol/L   Chloride 117 (H) 98 - 111 mmol/L   CO2 24 22 - 32 mmol/L   Glucose, Bld 145 (H) 70 - 99 mg/dL    Comment: Glucose reference range  applies only to samples taken after fasting for at least 8 hours.   BUN 66 (H) 8 - 23 mg/dL   Creatinine, Ser 4.55 (H) 0.61 - 1.24 mg/dL   Calcium 8.4 (L) 8.9 - 10.3 mg/dL   Total Protein 5.9 (L) 6.5 - 8.1 g/dL   Albumin 2.4 (L) 3.5 - 5.0 g/dL   AST 28 15 - 41 U/L   ALT 24 0 - 44 U/L   Alkaline Phosphatase 126 38 - 126 U/L   Total Bilirubin 0.8 0.3 - 1.2 mg/dL   GFR, Estimated 12 (L) >60 mL/min    Comment: (NOTE) Calculated using the CKD-EPI Creatinine Equation (2021)    Anion gap 11 5 - 15    Comment: Performed at [facility], [place] [address] [place], [place] [address]  Troponin I (High Sensitivity)     Status:  Abnormal   Collection Time: 02/01/20  4:18 PM  Result Value Ref Range   Troponin I (High Sensitivity) 90 (H) <18 ng/L    Comment: (NOTE) Elevated high sensitivity troponin I (hsTnI) values and significant  changes across serial measurements may suggest ACS but many other  chronic and acute conditions are known to elevate hsTnI results.  Refer to the "Links" section for chest pain algorithms and additional  guidance. Performed at [facility], [place] [address], [place], [place] [address]   Troponin I (High Sensitivity)     Status: Abnormal   Collection Time: 02/01/20  6:44 PM  Result Value Ref Range   Troponin I (High Sensitivity) 82 (H) <18 ng/L    Comment: (NOTE) Elevated high sensitivity troponin I (hsTnI) values and significant  changes across serial measurements may suggest ACS but many other  chronic and acute conditions are known to elevate hsTnI results.  Refer to the "Links" section for chest pain algorithms and additional  guidance. Performed at [facility], [place] [address], [place], [place] [address]     Chemistries  Recent Labs  Lab 01/26/20 0350 02/01/20 1618  NA 142 152*  K 4.2 3.8  CL 111 117*  CO2 19* 24  GLUCOSE 154* 145*  BUN 78* 66*  CREATININE 3.71* 4.55*  CALCIUM 8.4* 8.4*  MG 2.3  --   AST 38 28  ALT 24 24   ALKPHOS 132* 126  BILITOT 1.0 0.8   ------------------------------------------------------------------------------------------------------------------  ------------------------------------------------------------------------------------------------------------------ GFR: Estimated Creatinine Clearance: 13.2 mL/min (A) (by C-G formula based on SCr of 4.55 mg/dL (H)). Liver Function Tests: Recent Labs  Lab 01/26/20 0350 02/01/20 1618  AST 38 28  ALT 24 24  ALKPHOS 132* 126  BILITOT 1.0 0.8  PROT 5.2* 5.9*  ALBUMIN 2.5* 2.4*   No results for input(s): LIPASE, AMYLASE in the last 168 hours. No results for input(s): AMMONIA in the last 168 hours. Coagulation Profile: No results for input(s): INR, PROTIME in the last 168 hours. Cardiac Enzymes: No results for input(s): CKTOTAL, CKMB, CKMBINDEX, TROPONINI in the last 168 hours. BNP (last 3 results) No results for input(s): PROBNP in the last 8760 hours. HbA1C: No results for input(s): HGBA1C in the last 72 hours. CBG: No results for input(s): GLUCAP in the last 168 hours. Lipid Profile: No results for input(s): CHOL, HDL, LDLCALC, TRIG, CHOLHDL, LDLDIRECT in the last 72 hours. Thyroid Function Tests: No results for input(s): TSH, T4TOTAL, FREET4, T3FREE, THYROIDAB in the last 72 hours. Anemia Panel: No results for input(s): VITAMINB12, FOLATE, FERRITIN, TIBC, IRON, RETICCTPCT in the last 72 hours.  --------------------------------------------------------------------------------------------------------------- Urine analysis:    Component Value Date/Time   COLORURINE YELLOW 01/23/2020 1910   APPEARANCEUR CLOUDY (A) 01/23/2020 1910   LABSPEC 1.014 01/23/2020 1910   PHURINE 5.0 01/23/2020 1910   GLUCOSEU 50 (A) 01/23/2020 1910   HGBUR SMALL (A) 01/23/2020 1910   BILIRUBINUR NEGATIVE 01/23/2020 1910   KETONESUR NEGATIVE 01/23/2020 1910   PROTEINUR >=300 (A) 01/23/2020 1910   UROBILINOGEN 1.0 12/03/2013 0921   NITRITE  POSITIVE (A) 01/23/2020 1910   LEUKOCYTESUR LARGE (A) 01/23/2020 1910      Imaging Results:    DG Chest Port 1 View  Result Date: 02/01/2020 CLINICAL DATA:  Recently discharged with COVID-19.  Short of breath. EXAM: PORTABLE CHEST 1 VIEW COMPARISON:  01/23/2020 FINDINGS: Dual lead pacer. Midline trachea. Moderate cardiomegaly. No pleural effusion or pneumothorax. Mild interstitial thickening, likely related to remote smoking history. Improved left-sided  aeration, without residual well-defined lobar consolidation. IMPRESSION: Improved left-sided aeration. Cannot exclude mild residual interstitial opacities which could represent scarring or pneumonia. Cardiomegaly without congestive failure. Electronically Signed   By: Abigail Miyamoto M.D.   On: 02/01/2020 17:01    My personal review of [id number] paced rate 61   Assessment & Plan:    Principal Problem:   AKI (acute kidney injury) ([place]) Active Problems:   HTN (hypertension)   Hyperlipidemia with target LDL less than 70   OSA on CPAP   PAF (paroxysmal atrial fibrillation) (HCC)   COVID-19 virus infection   Intractable nausea and vomiting   Hypernatremia   Elevated troponin   1. AKI 1. Discharged with Cr 3.7 2. Today Cr 4.55 3. Discharged on home lasix and bicarb 4. Hold lasix, continue Bicarb 5. Nephro consulted from ED and advised D5W infusion 6. Gentle hydration in the setting of CHF - Last echo 12/2019 EF 40-45% 7. Of note - solitary left kidney 8. 2/2 to dehydration 2/2 GI losses and poor PO intake 2. Hypernatremia 1. 2/2 to above 2. See treatment above 3. Trend with BMP at midnight and 5 AM 3. Intractable nausea and vomiting 1. Zofran for nausea 2. Phenergan for refractory nausea 3. Advance diet as tolerate 4. Elevated troponin 1. Initial trop 90 in the setting of AKI 2. Repeat pending 3. Chest pain free 4. Does endorse dyspnea, but appears comfortable, normal RR, no accessory muscle use, maintaing O2 sats  95-100% on Room Air 5. EKG is ventricular paced 6. Monitor on tele 5. Covid + 1. Finished short course of steroid and remdesivir on last admission 2. CXR shows improved aeration 3. Airborne precautions 4. Continue to monitor 6. HTN 1. Continue home medications 7. HLD 1. Continue statin 8. Afib 1. Was supposed to restart xarelto 1/22 per discharge summary - patient has dried blood on lips. Continue holding Xarelto, and monitor HH. If HH stable after hydration, consider restarting xarelto.  9. BPH 1. Continue flomax 10. Leukocytosis 1. Improved aeration on chest x-ray -possible scarring versus pneumonia, procalcitonin pending 2. UA pending 3. Abdominal source is less likely given that there is no abdominal pain at home with palpation 4. Nausea vomiting diarrhea has thought to be consistent with COVID infection    DVT Prophylaxis- SCDs  AM Labs Ordered, also please review Full Orders  Family Communication: Admission, patients condition and plan of care including tests being ordered have been discussed with the patient and brother who indicate understanding and agree with the plan and Code Status.  Code Status:  Full  Admission status: Observation Time spent in minutes : [place]

## 2020-02-01 NOTE — ED Provider Notes (Signed)
[facility] EMERGENCY DEPARTMENT Provider Note   CSN: [id number] Arrival date & time: 02/01/20  1345     History Chief Complaint  Patient presents with  . Shortness of Breath    Paul Price is a 80 y.o. male.  The history is provided by the patient and medical records.  Shortness of Breath  Paul Price is a 80 y.o. male who presents to the Emergency Department complaining of malaise.  He presents to the ED complaining of feeling unwell and having the "blahs."  He reports low energy, poor appetite, vomiting.  Vomiting began yesterday.  He was discharged home two days ago following admission for covid infection and upper gi bleed.  He states that any time he tries to eat or drink he vomits.  His vomit is described as mucous.  He is vomiting his medications. Has mild associated sob, today has been the worst.   Denies fevers, HA, CP, abdominal pain, diarrhea.  Lives with wife.  Uses a walker at times for ambulation.      Past Medical History:  Diagnosis Date  . Arthritis    "shoulders" (09/07/2016)  . Atrial fibrillation ([place])   . BPH (benign prostatic hyperplasia)   . Chronic kidney disease (CKD), stage III (moderate) (HCC)   . Coronary artery disease   . DDD (degenerative disc disease), cervical   . DVT (deep venous thrombosis) ([place]) 12/2013   LLE  . GERD (gastroesophageal reflux disease)   . Gout   . High cholesterol   . History of blood transfusion 09/06/2016   2 u PRBC  . History of scarlet fever 1940s  . History of stress test 06/2011   No significant ischemia, this is a low risk scan. Clinical correlation recommended Abnormal myocardial perfusion study.  Marland Kitchen Hx of echocardiogram 05/2009   EF 40-45%, he did have mild annular calcification with mild-to-moderate MR and mild TR as well as aortic valve sclerosis. Estimated RV systolic pressure was 21 mm.  . Hypertension   . Iron deficiency anemia   . Kidney failure   . 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 Neck pain    "not chronic" (09/07/2016)  . Obesity   . OSA on CPAP   . RBBB   . Renal insufficiency   . Right renal mass   . Spinal stenosis of lumbar region     Patient Active Problem List   Diagnosis Date Noted  . UGI bleed 01/22/2020  . COVID-19 virus infection 01/21/2020  . Upper GI bleed 01/20/2020  . Melena 11/16/2019  . Symptomatic anemia 11/14/2019  . Stage 5 chronic kidney disease ([place]) 11/14/2019  . Anemia in chronic kidney disease 08/18/2019  . Renal mass 03/31/2019  . Acute on chronic systolic CHF (congestive heart failure) ([place])   . 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 Symptomatic bradycardia 12/21/2016  . Systolic congestive heart failure ([place]) 12/20/2016  . Normocytic anemia 09/06/2016  . Upper GI bleeding 09/06/2016  . Acute blood loss anemia   . New onset a-fib ([place]) 08/08/2016  . Chronic diastolic (congestive) heart failure ([place]) 08/08/2016  . Hypotension 08/08/2016  . A-fib ([place]) 08/08/2016  . Primary gout   . 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 Syncope 12/02/2013  . Left leg DVT ([place]) 12/02/2013  . Fever 12/02/2013  . AKI (acute kidney injury) ([place]) 12/02/2013  . Renal insufficiency 07/02/2013  . PAF (paroxysmal atrial fibrillation) ([place]) 01/04/2013  . Sinus pause, 4.3 seconds.  Recorded by [facility]. 12/02/2012  . NSVT (nonsustained ventricular tachycardia) ([place]) 12/02/2012  . Dyspnea on exertion 11/20/2012  . 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 HTN (hypertension) 08/20/2012  . Hyperlipidemia with target LDL less than 70 08/20/2012  . Obesity (BMI 30-39.9) 08/20/2012  . OSA on CPAP 08/20/2012  . RBBB 08/20/2012    Past Surgical History:  Procedure Laterality Date  . APPENDECTOMY    . CARDIAC CATHETERIZATION  2009   he was found to have a atretic LIMA graft to his LAD and had a patent vein graft supplying his diagonal vessel. At that time, he had 70% narrowing in his LAD beyond a diagonal vessel  which was initially treated medically.  . COLONOSCOPY WITH PROPOFOL Left 09/09/2016   Procedure: COLONOSCOPY WITH PROPOFOL;  Surgeon: Arta Silence, MD;  Location: [facility];  Service: Endoscopy;  Laterality: Left;  . CORONARY ANGIOPLASTY WITH STENT PLACEMENT  April 2012   LAD DES  . CORONARY ARTERY BYPASS GRAFT  1992   with LIMA to the LAD and diagonal.  . CYSTOSCOPY N/A 03/31/2019   Procedure: CYSTOSCOPY FLEXIBLE;  Surgeon: Lucas Mallow, MD;  Location: [facility];  Service: Urology;  Laterality: N/A;  . CYSTOSCOPY WITH URETHRAL DILATATION N/A 05/21/2019   Procedure: CYSTOSCOPY WITH URETHRAL BALLOON DILATATION;  Surgeon: Lucas Mallow, MD;  Location: [facility];  Service: Urology;  Laterality: N/A;  . ESOPHAGOGASTRODUODENOSCOPY N/A 11/16/2019   Procedure: ESOPHAGOGASTRODUODENOSCOPY (EGD);  Surgeon: Wilford Corner, MD;  Location: [facility];  Service: Endoscopy;  Laterality: N/A;  . ESOPHAGOGASTRODUODENOSCOPY (EGD) WITH PROPOFOL Left 09/08/2016   Procedure: ESOPHAGOGASTRODUODENOSCOPY (EGD) WITH PROPOFOL;  Surgeon: Ronnette Juniper, MD;  Location: [facility];  Service: Gastroenterology;  Laterality: Left;  . GIVENS CAPSULE STUDY N/A 11/16/2019   Procedure: GIVENS CAPSULE STUDY;  Surgeon: Wilford Corner, MD;  Location: [facility];  Service: Endoscopy;  Laterality: N/A;  . INGUINAL HERNIA REPAIR     "don't remeimber which side"  . LAPAROSCOPIC NEPHRECTOMY Right 03/31/2019   Procedure: RIGHT  HAND ASSIST LAPAROSCOPIC NEPHRECTOMY;  Surgeon: Lucas Mallow, MD;  Location: [facility];  Service: Urology;  Laterality: Right;  . PACEMAKER IMPLANT N/A 12/25/2016   Procedure: PACEMAKER IMPLANT;  Surgeon: Deboraha Sprang, MD;  Location: [facility];  Service: Cardiovascular;  Laterality: N/A;  . TONSILLECTOMY         Family History  Problem Relation Age of Onset  . Heart disease Father     Social History   Tobacco Use  . Smoking status: Former Smoker    Packs/day: 1.00     Years: 30.00    Pack years: 30.00    Types: Cigarettes  . Smokeless tobacco: Never Used  . Tobacco comment: quit ~ 1985  Vaping Use  . Vaping Use: Never used  Substance Use Topics  . Alcohol use: Yes    Alcohol/week: 0.0 standard drinks    Comment: rare beer  . Drug use: No    Home Medications Prior to Admission medications   Medication Sig Start Date End Date Taking? Authorizing Provider  acetaminophen (TYLENOL) 500 MG tablet Take 500-1,000 mg by mouth every 6 (six) hours as needed for mild pain, moderate pain or headache.     [provider]  allopurinol (ZYLOPRIM) 300 MG tablet Take 300 mg by mouth daily.    [provider]  amLODipine (NORVASC) 5 MG tablet Take 2.5 mg by mouth daily.  [provider]  atorvastatin (LIPITOR) 20 MG tablet TAKE 1 TABLET BY MOUTH DAILY Patient taking differently: Take 20 mg by mouth daily. 09/08/19   Troy Sine, MD  b complex vitamins tablet Take 1 tablet by mouth daily.    [provider]  docusate sodium (COLACE) 100 MG capsule Take 2 capsules (200 mg total) by mouth daily. 01/26/20   Thurnell Lose, MD  ferrous sulfate 325 (65 FE) MG tablet Take 1 tablet (325 mg total) by mouth 2 (two) times daily with a meal. 01/26/20   Thurnell Lose, MD  furosemide (LASIX) 40 MG tablet Take 0.5 tablets (20 mg total) by mouth daily. 11/17/19   Geradine Girt, DO  metoprolol succinate (TOPROL-XL) 25 MG 24 hr tablet TAKE 1 TABLET BY MOUTH DAILY Patient taking differently: Take 25 mg by mouth daily. 01/19/20   Troy Sine, MD  pantoprazole (PROTONIX) 40 MG tablet Take 1 tablet (40 mg total) by mouth 2 (two) times daily. 01/26/20   Thurnell Lose, MD  Rivaroxaban (XARELTO) 15 MG TABS tablet Take 1 tablet (15 mg total) by mouth daily after supper. 02/01/20   Thurnell Lose, MD  sodium bicarbonate 650 MG tablet Take 1 tablet (650 mg total) by mouth 3 (three) times daily. 01/26/20   Thurnell Lose, MD  Tamsulosin  HCl (FLOMAX) 0.4 MG CAPS Take 1 capsule (0.4 mg total) by mouth daily. 07/20/11   Chauncy Passy, MD    Allergies    Simvastatin and Nitrostat [nitroglycerin]  Review of Systems   Review of Systems  Respiratory: Positive for shortness of breath.   All other systems reviewed and are negative.   Physical Exam Updated Vital Signs BP (!) 151/58   Pulse 62   Temp 98 F (36.7 C) (Oral)   Resp 14   SpO2 95%   Physical Exam Vitals and nursing note reviewed.  Constitutional:      Appearance: He is well-developed and well-nourished.  HENT:     Head: Normocephalic and atraumatic.  Cardiovascular:     Rate and Rhythm: Normal rate and regular rhythm.     Heart sounds: No murmur heard.   Pulmonary:     Effort: Pulmonary effort is normal. No respiratory distress.     Breath sounds: Normal breath sounds.  Abdominal:     Palpations: Abdomen is soft.     Tenderness: There is no abdominal tenderness. There is no guarding or rebound.  Musculoskeletal:        General: No tenderness or edema.     Comments: Trace pitting edema to BLE  Skin:    General: Skin is warm and dry.  Neurological:     Mental Status: He is alert and oriented to person, place, and time.     Comments: Very hard of hearing.  Mild generalized weakness  Psychiatric:        Mood and Affect: Mood and affect normal.        Behavior: Behavior normal.     ED Results / Procedures / Treatments   Labs (all labs ordered are listed, but only abnormal results are displayed) Labs Reviewed  CBC WITH DIFFERENTIAL/PLATELET - Abnormal; Notable for the following components:      Result Value   WBC 16.8 (*)    RBC 3.71 (*)    Hemoglobin 9.2 (*)    HCT 31.2 (*)    MCH 24.8 (*)    MCHC 29.5 (*)    RDW 19.8 (*)  Neutro Abs 14.5 (*)    Abs Immature Granulocytes 0.48 (*)    All other components within normal limits  COMPREHENSIVE METABOLIC PANEL - Abnormal; Notable for the following components:   Sodium 152 (*)    Chloride  117 (*)    Glucose, Bld 145 (*)    BUN 66 (*)    Creatinine, Ser 4.55 (*)    Calcium 8.4 (*)    Total Protein 5.9 (*)    Albumin 2.4 (*)    GFR, Estimated 12 (*)    All other components within normal limits  TROPONIN I (HIGH SENSITIVITY) - Abnormal; Notable for the following components:   Troponin I (High Sensitivity) 90 (*)    All other components within normal limits  URINE CULTURE  URINALYSIS, ROUTINE W REFLEX MICROSCOPIC  TROPONIN I (HIGH SENSITIVITY)    EKG EKG Interpretation  Date/Time:  Sunday February 01 2020 16:02:09 EST Ventricular Rate:  64 PR Interval:    QRS Duration: 158 QT Interval:  488 QTC Calculation: 503 R Axis:   -34 Text Interpretation: Ventricular-paced rhythm Abnormal ECG Confirmed by Quintella Reichert [phone]) on 02/01/2020 4:52:05 PM   Radiology DG Chest Port 1 View  Result Date: 02/01/2020 CLINICAL DATA:  Recently discharged with COVID-19.  Short of breath. EXAM: PORTABLE CHEST 1 VIEW COMPARISON:  01/23/2020 FINDINGS: Dual lead pacer. Midline trachea. Moderate cardiomegaly. No pleural effusion or pneumothorax. Mild interstitial thickening, likely related to remote smoking history. Improved left-sided aeration, without residual well-defined lobar consolidation. IMPRESSION: Improved left-sided aeration. Cannot exclude mild residual interstitial opacities which could represent scarring or pneumonia. Cardiomegaly without congestive failure. Electronically Signed   By: Abigail Miyamoto M.D.   On: 02/01/2020 17:01    Procedures Procedures (including critical care time)  Medications Ordered in ED Medications  dextrose 5 % solution (has no administration in time range)    ED Course  I have reviewed the triage vital signs and the nursing notes.  Pertinent labs & imaging results that were available during my care of the patient were reviewed by me and considered in my medical decision making (see chart for details).    MDM Rules/Calculators/A&P                          patient with history of CKD, recent COVID-19 infection here for evaluation of recurrent vomiting, malaise. He is mildly dehydrated appearing on evaluation. Labs with worsening renal function. Discussed with nephrology, will start D5 W (or 1/4 NS) for gentle IV fluid hydration and setting of acute on chronic renal failure with hyponatremia. Hospitalist consulted for admission. Patient updated findings of studies and recommendation for admission and they are in agreement with treatment plan.  CBC with leukocytosis, no clear source of infection at this time.    Paul Price was evaluated in Emergency Department on 02/01/2020 for the symptoms described in the history of present illness. He was evaluated in the context of the global COVID-19 pandemic, which necessitated consideration that the patient might be at risk for infection with the SARS-CoV-2 virus that causes COVID-19. Institutional protocols and algorithms that pertain to the evaluation of patients at risk for COVID-19 are in a state of rapid change based on information released by regulatory bodies including the CDC and federal and state organizations. These policies and algorithms were followed during the patient's care in the ED.  Final Clinical Impression(s) / ED Diagnoses Final diagnoses:  AKI (acute kidney injury) ([place])  Hypernatremia  Rx / DC Orders ED Discharge Orders    None       Quintella Reichert, MD 02/01/20 [phone]

## 2020-02-01 NOTE — ED Triage Notes (Signed)
Pt to triage via [facility] from home.  Discharged from hospital on Friday for COVID + and "kidney problems".  C/o SOB since being home.  Wife states pt breathing more shallow this morning.  95% on room air. CBG 186.  Pt ambulatory to stretcher per EMS.

## 2020-02-02 DIAGNOSIS — Q211 Atrial septal defect: Secondary | ICD-10-CM | POA: Diagnosis not present

## 2020-02-02 DIAGNOSIS — I480 Paroxysmal atrial fibrillation: Secondary | ICD-10-CM | POA: Diagnosis not present

## 2020-02-02 DIAGNOSIS — I132 Hypertensive heart and chronic kidney disease with heart failure and with stage 5 chronic kidney disease, or end stage renal disease: Secondary | ICD-10-CM | POA: Diagnosis not present

## 2020-02-02 DIAGNOSIS — R627 Adult failure to thrive: Secondary | ICD-10-CM | POA: Diagnosis present

## 2020-02-02 DIAGNOSIS — I5022 Chronic systolic (congestive) heart failure: Secondary | ICD-10-CM | POA: Diagnosis not present

## 2020-02-02 DIAGNOSIS — N186 End stage renal disease: Secondary | ICD-10-CM | POA: Diagnosis not present

## 2020-02-02 DIAGNOSIS — I10 Essential (primary) hypertension: Secondary | ICD-10-CM | POA: Diagnosis not present

## 2020-02-02 DIAGNOSIS — M160 Bilateral primary osteoarthritis of hip: Secondary | ICD-10-CM | POA: Diagnosis not present

## 2020-02-02 DIAGNOSIS — I361 Nonrheumatic tricuspid (valve) insufficiency: Secondary | ICD-10-CM | POA: Diagnosis not present

## 2020-02-02 DIAGNOSIS — R1110 Vomiting, unspecified: Secondary | ICD-10-CM | POA: Diagnosis not present

## 2020-02-02 DIAGNOSIS — I2510 Atherosclerotic heart disease of native coronary artery without angina pectoris: Secondary | ICD-10-CM | POA: Diagnosis present

## 2020-02-02 DIAGNOSIS — E871 Hypo-osmolality and hyponatremia: Secondary | ICD-10-CM | POA: Diagnosis not present

## 2020-02-02 DIAGNOSIS — U071 COVID-19: Secondary | ICD-10-CM | POA: Diagnosis not present

## 2020-02-02 DIAGNOSIS — G4733 Obstructive sleep apnea (adult) (pediatric): Secondary | ICD-10-CM | POA: Diagnosis present

## 2020-02-02 DIAGNOSIS — I248 Other forms of acute ischemic heart disease: Secondary | ICD-10-CM | POA: Diagnosis not present

## 2020-02-02 DIAGNOSIS — Z85528 Personal history of other malignant neoplasm of kidney: Secondary | ICD-10-CM | POA: Diagnosis not present

## 2020-02-02 DIAGNOSIS — E860 Dehydration: Secondary | ICD-10-CM | POA: Diagnosis present

## 2020-02-02 DIAGNOSIS — E7800 Pure hypercholesterolemia, unspecified: Secondary | ICD-10-CM | POA: Diagnosis present

## 2020-02-02 DIAGNOSIS — E861 Hypovolemia: Secondary | ICD-10-CM | POA: Diagnosis present

## 2020-02-02 DIAGNOSIS — Y92239 Unspecified place in hospital as the place of occurrence of the external cause: Secondary | ICD-10-CM | POA: Diagnosis present

## 2020-02-02 DIAGNOSIS — E870 Hyperosmolality and hypernatremia: Secondary | ICD-10-CM | POA: Diagnosis present

## 2020-02-02 DIAGNOSIS — N390 Urinary tract infection, site not specified: Secondary | ICD-10-CM | POA: Diagnosis not present

## 2020-02-02 DIAGNOSIS — I340 Nonrheumatic mitral (valve) insufficiency: Secondary | ICD-10-CM | POA: Diagnosis not present

## 2020-02-02 DIAGNOSIS — X58XXXA Exposure to other specified factors, initial encounter: Secondary | ICD-10-CM | POA: Diagnosis present

## 2020-02-02 DIAGNOSIS — K8020 Calculus of gallbladder without cholecystitis without obstruction: Secondary | ICD-10-CM | POA: Diagnosis not present

## 2020-02-02 DIAGNOSIS — I513 Intracardiac thrombosis, not elsewhere classified: Secondary | ICD-10-CM | POA: Diagnosis not present

## 2020-02-02 DIAGNOSIS — K5900 Constipation, unspecified: Secondary | ICD-10-CM | POA: Diagnosis present

## 2020-02-02 DIAGNOSIS — B9561 Methicillin susceptible Staphylococcus aureus infection as the cause of diseases classified elsewhere: Secondary | ICD-10-CM | POA: Diagnosis present

## 2020-02-02 DIAGNOSIS — R7881 Bacteremia: Secondary | ICD-10-CM | POA: Diagnosis not present

## 2020-02-02 DIAGNOSIS — D509 Iron deficiency anemia, unspecified: Secondary | ICD-10-CM | POA: Diagnosis present

## 2020-02-02 DIAGNOSIS — E785 Hyperlipidemia, unspecified: Secondary | ICD-10-CM | POA: Diagnosis present

## 2020-02-02 DIAGNOSIS — N185 Chronic kidney disease, stage 5: Secondary | ICD-10-CM | POA: Diagnosis not present

## 2020-02-02 DIAGNOSIS — E876 Hypokalemia: Secondary | ICD-10-CM | POA: Diagnosis not present

## 2020-02-02 DIAGNOSIS — I120 Hypertensive chronic kidney disease with stage 5 chronic kidney disease or end stage renal disease: Secondary | ICD-10-CM | POA: Diagnosis not present

## 2020-02-02 DIAGNOSIS — N179 Acute kidney failure, unspecified: Secondary | ICD-10-CM | POA: Diagnosis not present

## 2020-02-02 DIAGNOSIS — Z905 Acquired absence of kidney: Secondary | ICD-10-CM | POA: Diagnosis not present

## 2020-02-02 DIAGNOSIS — E8809 Other disorders of plasma-protein metabolism, not elsewhere classified: Secondary | ICD-10-CM | POA: Diagnosis present

## 2020-02-02 DIAGNOSIS — R778 Other specified abnormalities of plasma proteins: Secondary | ICD-10-CM | POA: Diagnosis not present

## 2020-02-02 LAB — COMPREHENSIVE METABOLIC PANEL
ALT: 22 U/L (ref 0–44)
AST: 28 U/L (ref 15–41)
Albumin: 2.2 g/dL — ABNORMAL LOW (ref 3.5–5.0)
Alkaline Phosphatase: 109 U/L (ref 38–126)
Anion gap: 10 (ref 5–15)
BUN: 65 mg/dL — ABNORMAL HIGH (ref 8–23)
CO2: 23 mmol/L (ref 22–32)
Calcium: 8 mg/dL — ABNORMAL LOW (ref 8.9–10.3)
Chloride: 117 mmol/L — ABNORMAL HIGH (ref 98–111)
Creatinine, Ser: 4.55 mg/dL — ABNORMAL HIGH (ref 0.61–1.24)
GFR, Estimated: 12 mL/min — ABNORMAL LOW (ref >60)
Glucose, Bld: 155 mg/dL — ABNORMAL HIGH (ref 70–99)
Potassium: 3 mmol/L — ABNORMAL LOW (ref 3.5–5.1)
Sodium: 150 mmol/L — ABNORMAL HIGH (ref 135–145)
Total Bilirubin: 0.9 mg/dL (ref 0.3–1.2)
Total Protein: 5.3 g/dL — ABNORMAL LOW (ref 6.5–8.1)

## 2020-02-02 LAB — PROCALCITONIN: Procalcitonin: 0.36 ng/mL

## 2020-02-02 LAB — CBC
HCT: 29.7 % — ABNORMAL LOW (ref 39.0–52.0)
Hemoglobin: 8.5 g/dL — ABNORMAL LOW (ref 13.0–17.0)
MCH: 24.5 pg — ABNORMAL LOW (ref 26.0–34.0)
MCHC: 28.6 g/dL — ABNORMAL LOW (ref 30.0–36.0)
MCV: 85.6 fL (ref 80.0–100.0)
Platelets: 226 10*3/uL (ref 150–400)
RBC: 3.47 MIL/uL — ABNORMAL LOW (ref 4.22–5.81)
RDW: 19.8 % — ABNORMAL HIGH (ref 11.5–15.5)
WBC: 17.3 10*3/uL — ABNORMAL HIGH (ref 4.0–10.5)
nRBC: 0 % (ref 0.0–0.2)

## 2020-02-02 LAB — MAGNESIUM: Magnesium: 2.1 mg/dL (ref 1.7–2.4)

## 2020-02-02 LAB — CBG MONITORING, ED
Glucose-Capillary: 139 mg/dL — ABNORMAL HIGH (ref 70–99)
Glucose-Capillary: 145 mg/dL — ABNORMAL HIGH (ref 70–99)
Glucose-Capillary: 106 mg/dL — ABNORMAL HIGH (ref 70–99)

## 2020-02-02 MED ORDER — POTASSIUM CHLORIDE 20 MEQ PO PACK
20.0000 meq | PACK | Freq: Two times a day (BID) | ORAL | Status: AC
  Administered 2020-02-02 (×2): 20 meq via ORAL
  Filled 2020-02-02 (×2): qty 1

## 2020-02-02 MED ORDER — SODIUM CHLORIDE 0.9 % IV SOLN
2.0000 g | INTRAVENOUS | Status: DC
  Administered 2020-02-02 – 2020-02-04 (×3): 2 g via INTRAVENOUS
  Filled 2020-02-02 (×4): qty 20

## 2020-02-02 MED ORDER — DEXTROSE-NACL 5-0.45 % IV SOLN: INTRAVENOUS | Status: DC

## 2020-02-02 NOTE — ED Notes (Signed)
RN attempted report 

## 2020-02-02 NOTE — Consult Note (Signed)
[facility]  INPATIENT CONSULTATION  Reason for Consultation: AKI on CKD Requesting Provider: Dr. Reesa Chew  HPI: Paul Price is an 80 y.o. male with past medical history of advanced CKD 5 (follows w/ Dr. 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  Had AKI on CKD with Cr baseline ~3.5 upt to 4.4 during admission.  He returned from home to ED yesterday with dyspnea and nausea/emesis.  He was afebrile, normotensive/slightly hypertensive with sats OK on RA.  CXR improved.  Labs with BUN 66, Cr 4.55, Bicarb 24, Na 152, Hb 9.2.  Po lasix held, started on D5W infusion.  Labs this AM Na 150, Bicarb 23. BUN 65, Cr 4.55.  Hb 8.5.   He currently is tired from night in ED, not hungry.  Feeling a bit better with IVF.   Spoke with son [name] [phone] on phone 4:30pm for update.   Plan is to have AVG placed (no suitable veins for AVF) and proceed to in center HD when becomes uremic.   PMH: Past Medical History:  Diagnosis Date  . Arthritis    "shoulders" (09/07/2016)  . Atrial fibrillation ([place])   . BPH (benign prostatic hyperplasia)   . Chronic kidney disease (CKD), stage III (moderate) (HCC)   . Coronary artery disease   . DDD (degenerative disc disease), cervical   . DVT (deep venous thrombosis) ([place]) 12/2013   LLE  . GERD (gastroesophageal reflux disease)   . Gout   . High cholesterol   . History of blood transfusion 09/06/2016   2 u PRBC  . History of scarlet fever 1940s  . History of stress test 06/2011   No significant ischemia, this is a low risk scan. Clinical correlation recommended Abnormal myocardial perfusion study.  Marland Kitchen Hx of echocardiogram 05/2009   EF 40-45%, he  did have mild annular calcification with mild-to-moderate MR and mild TR as well as aortic valve sclerosis. Estimated RV systolic pressure was 21 mm.  . Hypertension   . Iron deficiency anemia   . Kidney failure   . 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 Neck pain    "not chronic" (09/07/2016)  . Obesity   . OSA on CPAP   . RBBB   . Renal insufficiency   . Right renal mass   . Spinal stenosis of lumbar region    PSH: Past Surgical History:  Procedure Laterality Date  . APPENDECTOMY    . CARDIAC CATHETERIZATION  2009   he was found to have a atretic LIMA graft to his LAD and had a patent vein graft supplying his diagonal vessel. At that time, he had 70% narrowing in his LAD beyond a diagonal vessel which was initially treated medically.  . COLONOSCOPY WITH PROPOFOL Left 09/09/2016   Procedure: COLONOSCOPY WITH PROPOFOL;  Surgeon: Arta Silence, MD;  Location: [place];  Service: Endoscopy;  Laterality: Left;  . CORONARY ANGIOPLASTY WITH STENT PLACEMENT  April 2012   LAD DES  . CORONARY ARTERY BYPASS GRAFT  1992   with LIMA to the LAD and diagonal.  . CYSTOSCOPY N/A 03/31/2019   Procedure: CYSTOSCOPY FLEXIBLE;  Surgeon: Lucas Mallow, MD;  Location: [facility];  Service: Urology;  Laterality: N/A;  . CYSTOSCOPY WITH URETHRAL DILATATION N/A 05/21/2019   Procedure: CYSTOSCOPY WITH URETHRAL BALLOON DILATATION;  Surgeon: Lucas Mallow, MD;  Location: [facility];  Service: Urology;  Laterality: N/A;  . ESOPHAGOGASTRODUODENOSCOPY N/A 11/16/2019   Procedure: ESOPHAGOGASTRODUODENOSCOPY (EGD);  Surgeon: Wilford Corner, MD;  Location: [place];  Service: Endoscopy;  Laterality: N/A;  . ESOPHAGOGASTRODUODENOSCOPY (EGD) WITH PROPOFOL Left 09/08/2016   Procedure: ESOPHAGOGASTRODUODENOSCOPY (EGD) WITH PROPOFOL;  Surgeon: Ronnette Juniper, MD;  Location: [place];  Service: Gastroenterology;  Laterality: Left;  . GIVENS CAPSULE STUDY N/A 11/16/2019   Procedure: GIVENS CAPSULE STUDY;   Surgeon: Wilford Corner, MD;  Location: [facility];  Service: Endoscopy;  Laterality: N/A;  . INGUINAL HERNIA REPAIR     "don't remeimber which side"  . LAPAROSCOPIC NEPHRECTOMY Right 03/31/2019   Procedure: RIGHT  HAND ASSIST LAPAROSCOPIC NEPHRECTOMY;  Surgeon: Lucas Mallow, MD;  Location: [facility];  Service: Urology;  Laterality: Right;  . PACEMAKER IMPLANT N/A 12/25/2016   Procedure: PACEMAKER IMPLANT;  Surgeon: Deboraha Sprang, MD;  Location: [place] [facility];  Service: Cardiovascular;  Laterality: N/A;  . TONSILLECTOMY       Past Medical History:  Diagnosis Date  . Arthritis    "shoulders" (09/07/2016)  . Atrial fibrillation ([place])   . BPH (benign prostatic hyperplasia)   . Chronic kidney disease (CKD), stage III (moderate) (HCC)   . Coronary artery disease   . DDD (degenerative disc disease), cervical   . DVT (deep venous thrombosis) ([place]) 12/2013   LLE  . GERD (gastroesophageal reflux disease)   . Gout   . High cholesterol   . History of blood transfusion 09/06/2016   2 u PRBC  . History of scarlet fever 1940s  . History of stress test 06/2011   No significant ischemia, this is a low risk scan. Clinical correlation recommended Abnormal myocardial perfusion study.  Marland Kitchen Hx of echocardiogram 05/2009   EF 40-45%, he did have mild annular calcification with mild-to-moderate MR and mild TR as well as aortic valve sclerosis. Estimated RV systolic pressure was 21 mm.  . Hypertension   . Iron deficiency anemia   . Kidney failure   . 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 Neck pain    "not chronic" (09/07/2016)  . Obesity   . OSA on CPAP   . RBBB   . Renal insufficiency   . Right renal mass   . Spinal stenosis of lumbar region     Medications:  I have reviewed the patient's current medications.  (Not in a hospital admission)   ALLERGIES:   Allergies  Allergen Reactions  . Simvastatin Other (See Comments)    Abdominal cramps  . Nitrostat  [Nitroglycerin] Other (See Comments)    Causes blood pressure to "bottom out"    FAM HX: Family History  Problem Relation Age of Onset  . Heart disease Father     Social History:   reports that he has quit smoking. His smoking use included cigarettes. He has a 30.00 pack-year smoking history. He has never used smokeless tobacco. He reports current alcohol use. He reports that he does not use drugs.  ROS: 12 system ROS negative except per HPI above.   Blood pressure (!) 139/59, pulse 60, temperature 98.4 F (36.9 C), temperature source Oral, resp. rate 20, SpO2 94 %. PHYSICAL EXAM: Gen: elderly man at 45 deg in bed - nontoxic but sleepy  Eyes: anicteric, EOMI ENT: MM  dry Neck: supple, no JVD CV:  RRR, paced on monitor, no rub Abd:  Soft, nontender Back: clear lungs with normal WOB GU: no foley, handheld urinal with 129m amber urine Extr:  No edema Neuro: some tremors noted but no asterixis; hard of hearing    Results for orders placed or performed during the hospital encounter of 02/01/20 (from the past 48 hour(s))  CBC with Differential     Status: Abnormal   Collection Time: 02/01/20  4:18 PM  Result Value Ref Range   WBC 16.8 (H) 4.0 - 10.5 K/uL   RBC 3.71 (L) 4.22 - 5.81 MIL/uL   Hemoglobin 9.2 (L) 13.0 - 17.0 g/dL   HCT 31.2 (L) 39.0 - 52.0 %   MCV 84.1 80.0 - 100.0 fL   MCH 24.8 (L) 26.0 - 34.0 pg   MCHC 29.5 (L) 30.0 - 36.0 g/dL   RDW 19.8 (H) 11.5 - 15.5 %   Platelets 249 150 - 400 K/uL   nRBC 0.0 0.0 - 0.2 %   Neutrophils Relative % 87 %   Neutro Abs 14.5 (H) 1.7 - 7.7 K/uL   Lymphocytes Relative 4 %   Lymphs Abs 0.7 0.7 - 4.0 K/uL   Monocytes Relative 6 %   Monocytes Absolute 1.0 0.1 - 1.0 K/uL   Eosinophils Relative 0 %   Eosinophils Absolute 0.1 0.0 - 0.5 K/uL   Basophils Relative 0 %   Basophils Absolute 0.0 0.0 - 0.1 K/uL   Immature Granulocytes 3 %   Abs Immature Granulocytes 0.48 (H) 0.00 - 0.07 K/uL    Comment: Performed at [facility] [address] [place] [id number] Comprehensive metabolic panel     Status: Abnormal   Collection Time: 02/01/20  4:18 PM  Result Value Ref Range   Sodium 152 (H) 135 - 145 mmol/L   Potassium 3.8 3.5 - 5.1 mmol/L   Chloride 117 (H) 98 - 111 mmol/L   CO2 24 22 - 32 mmol/L   Glucose, Bld 145 (H) 70 - 99 mg/dL    Comment: Glucose reference range applies only to samples taken after fasting for at least 8 hours.   BUN 66 (H) 8 - 23 mg/dL   Creatinine, Ser 4.55 (H) 0.61 - 1.24 mg/dL   Calcium 8.4 (L) 8.9 - 10.3 mg/dL   Total Protein 5.9 (L) 6.5 - 8.1 g/dL   Albumin 2.4 (L) 3.5 - 5.0 g/dL   AST 28 15 - 41 U/L   ALT 24 0 - 44 U/L   Alkaline Phosphatase 126 38 - 126 U/L   Total Bilirubin 0.8 0.3 - 1.2 mg/dL   GFR, Estimated 12 (L) >60 mL/min    Comment: (NOTE) Calculated using the CKD-EPI Creatinine Equation (2021)    Anion gap 11 5 - 15    Comment: Performed at [facility] [address] [id number] Troponin I (High Sensitivity)     Status: Abnormal   Collection Time: 02/01/20  4:18 PM  Result Value Ref Range   Troponin I (High Sensitivity) 90 (H) <18 ng/L    Comment: (NOTE) Elevated high sensitivity troponin I (hsTnI) values and significant  changes across serial measurements may suggest ACS but many other  chronic and acute conditions are known to elevate hsTnI results.  Refer to the "Links" section for chest pain algorithms and additional  guidance. Performed at [facility] [address], [place] [address]  Troponin I (High Sensitivity)     Status: Abnormal   Collection Time: 02/01/20  6:44 PM  Result Value Ref Range   Troponin I (High Sensitivity) 82 (H) <18 ng/L    Comment: (NOTE) Elevated high sensitivity troponin I (hsTnI) values and significant  changes across serial measurements may suggest ACS but many other  chronic and acute conditions are known to elevate hsTnI results.  Refer to the "Links" section for chest  pain algorithms and additional  guidance. Performed at [facility], [place] [address] [place], [place] [address]   CBG monitoring, ED     Status: Abnormal   Collection Time: 02/01/20 11:01 PM  Result Value Ref Range   Glucose-Capillary 135 (H) 70 - 99 mg/dL    Comment: Glucose reference range applies only to samples taken after fasting for at least 8 hours.  Urinalysis, Routine w reflex microscopic Urine, Clean Catch     Status: Abnormal   Collection Time: 02/01/20 11:17 PM  Result Value Ref Range   Color, Urine YELLOW YELLOW   APPearance HAZY (A) CLEAR   Specific Gravity, Urine 1.012 1.005 - 1.030   pH 6.0 5.0 - 8.0   Glucose, UA 50 (A) NEGATIVE mg/dL   Hgb urine dipstick LARGE (A) NEGATIVE   Bilirubin Urine NEGATIVE NEGATIVE   Ketones, ur NEGATIVE NEGATIVE mg/dL   Protein, ur >=300 (A) NEGATIVE mg/dL   Nitrite POSITIVE (A) NEGATIVE   Leukocytes,Ua MODERATE (A) NEGATIVE   RBC / HPF >50 (H) 0 - 5 RBC/hpf   WBC, UA >50 (H) 0 - 5 WBC/hpf   Bacteria, UA FEW (A) NONE SEEN   Squamous Epithelial / LPF 0-5 0 - 5    Comment: Performed at [facility], [address] [place], [place] [address]  Magnesium     Status: None   Collection Time: 02/02/20 12:55 AM  Result Value Ref Range   Magnesium 2.1 1.7 - 2.4 mg/dL    Comment: Performed at [facility] [address] [place], [place] [address]  Comprehensive metabolic panel     Status: Abnormal   Collection Time: 02/02/20 12:55 AM  Result Value Ref Range   Sodium 150 (H) 135 - 145 mmol/L   Potassium 3.0 (L) 3.5 - 5.1 mmol/L   Chloride 117 (H) 98 - 111 mmol/L   CO2 23 22 - 32 mmol/L   Glucose, Bld 155 (H) 70 - 99 mg/dL    Comment: Glucose reference range applies only to samples taken after fasting for at least 8 hours.   BUN 65 (H) 8 - 23 mg/dL   Creatinine, Ser 4.55 (H) 0.61 - 1.24 mg/dL   Calcium 8.0 (L) 8.9 - 10.3 mg/dL   Total Protein 5.3 (L) 6.5 - 8.1 g/dL   Albumin 2.2 (L) 3.5 - 5.0 g/dL   AST 28 15 - 41  U/L   ALT 22 0 - 44 U/L   Alkaline Phosphatase 109 38 - 126 U/L   Total Bilirubin 0.9 0.3 - 1.2 mg/dL   GFR, Estimated 12 (L) >60 mL/min    Comment: (NOTE) Calculated using the CKD-EPI Creatinine Equation (2021)    Anion gap 10 5 - 15    Comment: Performed at [facility], [place] [address] [place], [place] [address]  CBC     Status: Abnormal   Collection Time: 02/02/20 12:55 AM  Result Value Ref Range   WBC 17.3 (H) 4.0 - 10.5 K/uL   RBC 3.47 (  L) 4.22 - 5.81 MIL/uL   Hemoglobin 8.5 (L) 13.0 - 17.0 g/dL   HCT 29.7 (L) 39.0 - 52.0 %   MCV 85.6 80.0 - 100.0 fL   MCH 24.5 (L) 26.0 - 34.0 pg   MCHC 28.6 (L) 30.0 - 36.0 g/dL   RDW 19.8 (H) 11.5 - 15.5 %   Platelets 226 150 - 400 K/uL   nRBC 0.0 0.0 - 0.2 %    Comment: Performed at [facility] [address], [place], [place] [address]  Procalcitonin     Status: None   Collection Time: 02/02/20 12:55 AM  Result Value Ref Range   Procalcitonin 0.36 ng/mL    Comment:        Interpretation: PCT (Procalcitonin) <= 0.5 ng/mL: Systemic infection (sepsis) is not likely. Local bacterial infection is possible. (NOTE)       Sepsis PCT Algorithm           Lower Respiratory Tract                                      Infection PCT Algorithm    ----------------------------     ----------------------------         PCT < 0.25 ng/mL                PCT < 0.10 ng/mL          Strongly encourage             Strongly discourage   discontinuation of antibiotics    initiation of antibiotics    ----------------------------     -----------------------------       PCT 0.25 - 0.50 ng/mL            PCT 0.10 - 0.25 ng/mL               OR       >80% decrease in PCT            Discourage initiation of                                            antibiotics      Encourage discontinuation           of antibiotics    ----------------------------     -----------------------------         PCT >= 0.50 ng/mL              PCT 0.26 - 0.50 ng/mL                AND        <80% decrease in PCT             Encourage initiation of                                             antibiotics       Encourage continuation           of antibiotics    ----------------------------     -----------------------------        PCT >= 0.50 ng/mL  PCT > 0.50 ng/mL               AND         increase in PCT                  Strongly encourage                                      initiation of antibiotics    Strongly encourage escalation           of antibiotics                                     -----------------------------                                           PCT <= 0.25 ng/mL                                                 OR                                        > 80% decrease in PCT                                      Discontinue / Do not initiate                                             antibiotics  Performed at [facility], [address], [place], [place] [address]   CBG monitoring, ED     Status: Abnormal   Collection Time: 02/02/20  8:33 AM  Result Value Ref Range   Glucose-Capillary 139 (H) 70 - 99 mg/dL    Comment: Glucose reference range applies only to samples taken after fasting for at least 8 hours.  CBG monitoring, ED     Status: Abnormal   Collection Time: 02/02/20 12:12 PM  Result Value Ref Range   Glucose-Capillary 145 (H) 70 - 99 mg/dL    Comment: Glucose reference range applies only to samples taken after fasting for at least 8 hours.    DG Chest Port 1 View  Result Date: 02/01/2020 CLINICAL DATA:  Recently discharged with COVID-19.  Short of breath. EXAM: PORTABLE CHEST 1 VIEW COMPARISON:  01/23/2020 FINDINGS: Dual lead pacer. Midline trachea. Moderate cardiomegaly. No pleural effusion or pneumothorax. Mild interstitial thickening, likely related to remote smoking history. Improved left-sided aeration, without residual well-defined lobar consolidation. IMPRESSION: Improved left-sided aeration.  Cannot exclude mild residual interstitial opacities which could represent scarring or pneumonia. Cardiomegaly without congestive failure. Electronically Signed   By: Abigail Miyamoto M.D.   On: 02/01/2020 17:01    Assessment/Plan **AKI on CKD 5:  Appears most likely prerenal from poor po intake/emesis prior  to admission.  With BUN in the 60s I doubt this is uremia driving the GI symptoms but could be contributing.  Our plan is to gently volume expand and provide free water with D5 1/2NS at 150m/hr overnight (ordered 24h).  Will follow labs.  If renal function improves with hydration then we will need to prove he can maintain hydration prior to discharge.  We will track I/Os, limit nephrotoxins and avoid hypotension as well.  Discussed with pt and son via phone.  JMerry [phone] **Nausea/emesis:  I suspect this is an issue leading to the AKI but could be uremia - follow with volume resuscitation.  ? COVID related.  Being treated with antiemetics.    **Anemia:  On po iron, trend H/H; no indication for transfusion currently.  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  **Hypernatremia:  Improving, cont free water administration with D5 1/2NS.  **Hypokalemia:  Has been repleted, check in AM.     **recent COVID:  S/p steroids and remdesivir last admission; CXR improved, ok on RA.   Precautions per [facility].   **A fib:  Paced currently, xarelto on hold for now -- would consider coumadin or eliquis if resumed this admission given eGFR < 15.   LJustin Mend1/24/2022, 3:58 PM

## 2020-02-02 NOTE — ED Notes (Signed)
RN gave sodium bicarbonate, ferrous sulfate, and oral potassium. Pt threw up immediately. RN notified MD.

## 2020-02-02 NOTE — Progress Notes (Signed)
Physical Therapy Evaluation Patient Details Name: Paul Price MRN: [id number] DOB: Jul 19, 1940 Today's Date: 02/02/2020   History of Present Illness  Pt is an 80 y/o male admitted secondary to worsening covid symptoms. Pt with recent admission secondary to GI bleed. PMH includes a fib, DVT, HTN, gout, and OSA on CPAP.  Clinical Impression  Pt admitted secondary to problem above with deficits below. Pt requiring min guard A for mobility within the room this session. Pt reporting increased nausea and fatigue which limited mobility tolerance. Anticipate pt will progress well and be able to d/c home with HHPT. Will continue to follow acutely.      Follow Up Recommendations Home health PT;Supervision/Assistance - 24 hour    Equipment Recommendations  None recommended by PT    Recommendations for Other Services       Precautions / Restrictions Precautions Precautions: Fall Restrictions Weight Bearing Restrictions: No      Mobility  Bed Mobility Overal bed mobility: Needs Assistance Bed Mobility: Supine to Sit;Sit to Supine     Supine to sit: Min assist Sit to supine: Supervision   General bed mobility comments: Min A for trunk assist to come up to sitting on stretcher.    Transfers Overall transfer level: Needs assistance Equipment used: 1 person hand held assist Transfers: Sit to/from Stand Sit to Stand: Min guard         General transfer comment: Min guard for safety.  Ambulation/Gait Ambulation/Gait assistance: Min guard Gait Distance (Feet): 8 Feet Assistive device: 1 person hand held assist Gait Pattern/deviations: Step-through pattern Gait velocity: decreased   General Gait Details: Ambulating short distance in the room. VSS on RA. No LOB noted, but pt reports increased fatigue and nausea.  Stairs            Wheelchair Mobility    Modified Rankin (Stroke Patients Only)       Balance Overall balance assessment: Needs  assistance Sitting-balance support: No upper extremity supported;Feet supported Sitting balance-[name] Scale: Good     Standing balance support: Single extremity supported Standing balance-[name] Scale: Fair Standing balance comment: Able to maintain static standing without UE support                             Pertinent Vitals/Pain Pain Assessment: No/denies pain    Home Living Family/patient expects to be discharged to:: Private residence Living Arrangements: Spouse/significant other Available Help at Discharge: Family;Available 24 hours/day Type of Home: House Home Access: Stairs to enter Entrance Stairs-Rails: Right Entrance Stairs-Number of Steps: 6 Home Layout: One level Home Equipment: Walker - 2 wheels;Cane - single point;Other (comment);Wheelchair - manual;Shower seat (walking stick)      Prior Function Level of Independence: Independent with assistive device(s)         Comments: Was using RW since admission.     Hand Dominance        Extremity/Trunk Assessment   Upper Extremity Assessment Upper Extremity Assessment: Generalized weakness    Lower Extremity Assessment Lower Extremity Assessment: Generalized weakness    Cervical / Trunk Assessment Cervical / Trunk Assessment: Normal  Communication   Communication: HOH  Cognition Arousal/Alertness: Awake/alert Behavior During Therapy: WFL for tasks assessed/performed Overall Cognitive Status: No family/caregiver present to determine baseline cognitive functioning                                 General Comments:  Pt occasionally with responses that were off topic, but may be because of HAH. Mildly confused and stated he was at [employer].      General Comments      Exercises     Assessment/Plan    PT Assessment Patient needs continued PT services  PT Problem List Decreased balance;Decreased activity tolerance;Decreased strength;Decreased mobility;Decreased  knowledge of use of DME;Decreased cognition;Decreased safety awareness       PT Treatment Interventions DME instruction;Functional mobility training;Therapeutic activities;Therapeutic exercise;Balance training;Patient/family education    PT Goals (Current goals can be found in the Care Plan section)  Acute Rehab PT Goals Patient Stated Goal: "to figure out what the plan is" PT Goal Formulation: With patient Time For Goal Achievement: 02/16/20 Potential to Achieve Goals: Fair    Frequency Min 3X/week   Barriers to discharge        Co-evaluation               AM-PAC PT "6 Clicks" Mobility  Outcome Measure Help needed turning from your back to your side while in a flat bed without using bedrails?: None Help needed moving from lying on your back to sitting on the side of a flat bed without using bedrails?: A Little Help needed moving to and from a bed to a chair (including a wheelchair)?: A Little Help needed standing up from a chair using your arms (e.g., wheelchair or bedside chair)?: A Little Help needed to walk in hospital room?: A Little Help needed climbing 3-5 steps with a railing? : A Lot 6 Click Score: 18    End of Session   Activity Tolerance: Treatment limited secondary to medical complications (Comment);Patient limited by fatigue (nausea,) Patient left: in bed;with call bell/phone within reach (on stretcher in ED) Nurse Communication: Mobility status PT Visit Diagnosis: Unsteadiness on feet (R26.81);Muscle weakness (generalized) (M62.81)    Time: 6503-5465 PT Time Calculation (min) (ACUTE ONLY): 17 min   Charges:   PT Evaluation $PT Eval Moderate Complexity: 1 Mod          Reuel Derby, PT, DPT  Acute Rehabilitation Services  Pager: [phone] Office: [phone]   [name] 02/02/2020, 4:23 PM

## 2020-02-02 NOTE — Progress Notes (Signed)
PROGRESS NOTE    Paul Price  WGN:[id number] DOB: 08-24-1940 DOA: 02/01/2020 PCP: [name], MD   Brief Narrative: Taken from H&P Paul Price  is a 80 y.o. male, with history of paroxysmal atrial fibrillation, chronic kidney disease, hypertension, hyperlipidemia, BPH and more presents to the ED with a complaint of shortness of breath and nausea.  Patient was just discharged 01/26/2020 after an admission for upper GI bleeding, AKI, COVID-pneumonia.  Patient finished a course of remdesivir and steroids.  He did not have an oxygen requirement at that time but he did have infiltrates on repeat chest x-ray.  Patient was seen by GI they advised holding Xarelto until 01/31/2020.  Patient did not resume Xarelto at home.  Patient was discharged with a creatinine level of 3.71.  Today he reports that the "felt like the world was closing in" on him.  Patient reports that he has been throwing up every day whenever he tries to take in any liquid.  He has not tried to eat any solid foods.  He has been able to walk around with his walker, he does not feel dizzy, he does feel fatigued and weak.  Admitted for poor p.o. intake, dehydration, hypernatremia, nausea and AKI  Subjective: When seen during morning rounds he was stating that he feels little better as compared to yesterday but not to his baseline.  He was asking for some ginger ale.  Breakfast was sitting at bedside untouched, stating he does not feel like eating.  Assessment & Plan:   Principal Problem:   AKI (acute kidney injury) ([place]) Active Problems:   HTN (hypertension)   Hyperlipidemia with target LDL less than 70   OSA on CPAP   PAF (paroxysmal atrial fibrillation) (HCC)   COVID-19 virus infection   Intractable nausea and vomiting   Hypernatremia   Elevated troponin  AKI with CKD stage IV.  Discharge creatinine was 3.7, it was 4.55 on admission and remained the same today.  He was discharged on Lasix and bicarb. -Keep holding  Lasix -Continue with bicarb -Nephrology was consulted-will appreciate their recommendations. -Continue with gentle hydration. -Monitor renal function -Avoid nephrotoxins  Hypernatremia.  Secondary to poor p.o. intake and dehydration. -Continue with D5. -Monitor BMP  Hypokalemia.  Potassium at 3. -Replace gently as patient with worsening renal function. -Continue to monitor  HFrEF.  According to echo done in December 2021 EF was 40 to 45%. Appears euvolemic.  Holding home dose of Lasix. -Continue to monitor while he is getting gentle IV hydration  Nausea and vomiting.  No nausea or vomiting today but no appetite either. -Continue with supportive care  Elevated troponin.  Most likely secondary to demand ischemia and worsening renal function.  Trending down.  No chest pain.  Generalized weakness. -PT evaluation  Covid positive.  Patient was tested positive on 01/20/2020. Completed 2 weeks of isolation. Completed a course of remdesivir and steroid during recent hospitalization. No hypoxia -Continue to monitor  Hypertension.  Blood pressure mildly elevated. -Continue home meds.  Hyperlipidemia. -Continue home dose of statin.  A. fib.  Currently rate well controlled.  Xarelto was held during recent hospitalization due to GI bleed, supposed to start on 01/31/2020, has not started yet.  Hemoglobin at 8.5 this morning after getting some IV fluid. -We will monitor hemoglobin for another day-can restart if remains stable.  Leukocytosis.  Worsening leukocytosis, remained afebrile, procalcitonin at 0.36.  You will positive for nitrites and pyuria, urine cultures pending. -Start him on ceftriaxone. -Follow-up  urine culture  Objective: Vitals:   02/02/20 0500 02/02/20 0700 02/02/20 1101 02/02/20 1400  BP: (!) 160/70 (!) 156/62 (!) 145/58 (!) 139/59  Pulse: (!) 59 63 62 60  Resp: 16 17 (!) 22 20  Temp:    98.4 F (36.9 C)  TempSrc:    Oral  SpO2: 94% 98% 96% 94%     Intake/Output Summary (Last 24 hours) at 02/02/2020 1508 Last data filed at 02/02/2020 0045 Gross per 24 hour  Intake 319.16 ml  Output --  Net 319.16 ml   There were no vitals filed for this visit.  Examination:  General exam: Appears calm and comfortable, appears dry. Respiratory system: Clear to auscultation. Respiratory effort normal. Cardiovascular system: S1 & S2 heard, RRR.  Gastrointestinal system: Soft, nontender, nondistended, bowel sounds positive. Central nervous system: Alert and oriented. No focal neurological deficits. Extremities: No edema, no cyanosis, pulses intact and symmetrical. Psychiatry: Judgement and insight appear normal.    DVT prophylaxis: SCDs Code Status: Full Family Communication: Son was updated on phone.  Disposition Plan:  Status is: Inpatient  Remains inpatient appropriate because:Inpatient level of care appropriate due to severity of illness   Dispo: The patient is from: Home              Anticipated d/c is to: To be determined              Anticipated d/c date is: 2 days              Patient currently is not medically stable to d/c.   Difficult to place patient No   Consultants:   Nephrology  Procedures:  Antimicrobials:  Ceftriaxone  Data Reviewed: I have personally reviewed following labs and imaging studies  CBC: Recent Labs  Lab 02/01/20 1618 02/02/20 0055  WBC 16.8* 17.3*  NEUTROABS 14.5*  --   HGB 9.2* 8.5*  HCT 31.2* 29.7*  MCV 84.1 85.6  PLT 249 710   Basic Metabolic Panel: Recent Labs  Lab 02/01/20 1618 02/02/20 0055  NA 152* 150*  K 3.8 3.0*  CL 117* 117*  CO2 24 23  GLUCOSE 145* 155*  BUN 66* 65*  CREATININE 4.55* 4.55*  CALCIUM 8.4* 8.0*  MG  --  2.1   GFR: Estimated Creatinine Clearance: 13.2 mL/min (A) (by C-G formula based on SCr of 4.55 mg/dL (H)). Liver Function Tests: Recent Labs  Lab 02/01/20 1618 02/02/20 0055  AST 28 28  ALT 24 22  ALKPHOS 126 109  BILITOT 0.8 0.9  PROT  5.9* 5.3*  ALBUMIN 2.4* 2.2*   No results for input(s): LIPASE, AMYLASE in the last 168 hours. No results for input(s): AMMONIA in the last 168 hours. Coagulation Profile: No results for input(s): INR, PROTIME in the last 168 hours. Cardiac Enzymes: No results for input(s): CKTOTAL, CKMB, CKMBINDEX, TROPONINI in the last 168 hours. BNP (last 3 results) No results for input(s): PROBNP in the last 8760 hours. HbA1C: No results for input(s): HGBA1C in the last 72 hours. CBG: Recent Labs  Lab 02/01/20 2301 02/02/20 0833 02/02/20 1212  GLUCAP 135* 139* 145*   Lipid Profile: No results for input(s): CHOL, HDL, LDLCALC, TRIG, CHOLHDL, LDLDIRECT in the last 72 hours. Thyroid Function Tests: No results for input(s): TSH, T4TOTAL, FREET4, T3FREE, THYROIDAB in the last 72 hours. Anemia Panel: No results for input(s): VITAMINB12, FOLATE, FERRITIN, TIBC, IRON, RETICCTPCT in the last 72 hours. Sepsis Labs: Recent Labs  Lab 02/02/20 0055  PROCALCITON 0.36  No results found for this or any previous visit (from the past 240 hour(s)).   Radiology Studies: DG Chest Port 1 View  Result Date: 02/01/2020 CLINICAL DATA:  Recently discharged with COVID-19.  Short of breath. EXAM: PORTABLE CHEST 1 VIEW COMPARISON:  01/23/2020 FINDINGS: Dual lead pacer. Midline trachea. Moderate cardiomegaly. No pleural effusion or pneumothorax. Mild interstitial thickening, likely related to remote smoking history. Improved left-sided aeration, without residual well-defined lobar consolidation. IMPRESSION: Improved left-sided aeration. Cannot exclude mild residual interstitial opacities which could represent scarring or pneumonia. Cardiomegaly without congestive failure. Electronically Signed   By: Abigail Miyamoto M.D.   On: 02/01/2020 17:01    Scheduled Meds: . amLODipine  2.5 mg Oral Daily  . atorvastatin  20 mg Oral Daily  . ferrous sulfate  325 mg Oral BID WC  . insulin aspart  0-15 Units Subcutaneous TID WC   . insulin aspart  0-5 Units Subcutaneous QHS  . metoprolol succinate  25 mg Oral Daily  . pantoprazole  40 mg Oral BID  . sodium bicarbonate  650 mg Oral TID  . tamsulosin  0.4 mg Oral Daily   Continuous Infusions: . cefTRIAXone (ROCEPHIN)  IV Stopped (02/02/20 1007)  . dextrose 100 mL/hr at 02/02/20 1006     LOS: 0 days   Time spent: 35 minutes.  Lorella Nimrod, MD [facility]  If 7PM-7AM, please contact night-coverage [email or web address]  02/02/2020, 3:08 PM   This record has been created using Systems analyst. Errors have been sought and corrected,but may not always be located. Such creation errors do not reflect on the standard of care.

## 2020-02-02 NOTE — ED Notes (Signed)
Pt resting comfortably in bed, given extra warm blankets and cup of ice water Denies pain at this time. Respirations are even and non-labored on room air.  Skin is warm, dry an intact A&Ox4 Call light within reach

## 2020-02-03 ENCOUNTER — Inpatient Hospital Stay (HOSPITAL_COMMUNITY): Payer: Medicare Other

## 2020-02-03 DIAGNOSIS — N179 Acute kidney failure, unspecified: Secondary | ICD-10-CM | POA: Diagnosis not present

## 2020-02-03 LAB — RENAL FUNCTION PANEL
Albumin: 1.9 g/dL — ABNORMAL LOW (ref 3.5–5.0)
Albumin: 2 g/dL — ABNORMAL LOW (ref 3.5–5.0)
Anion gap: 10 (ref 5–15)
Anion gap: 12 (ref 5–15)
BUN: 58 mg/dL — ABNORMAL HIGH (ref 8–23)
BUN: 58 mg/dL — ABNORMAL HIGH (ref 8–23)
CO2: 23 mmol/L (ref 22–32)
CO2: 22 mmol/L (ref 22–32)
Calcium: 7.8 mg/dL — ABNORMAL LOW (ref 8.9–10.3)
Calcium: 7.8 mg/dL — ABNORMAL LOW (ref 8.9–10.3)
Chloride: 112 mmol/L — ABNORMAL HIGH (ref 98–111)
Chloride: 113 mmol/L — ABNORMAL HIGH (ref 98–111)
Creatinine, Ser: 4.47 mg/dL — ABNORMAL HIGH (ref 0.61–1.24)
Creatinine, Ser: 4.56 mg/dL — ABNORMAL HIGH (ref 0.61–1.24)
GFR, Estimated: 13 mL/min — ABNORMAL LOW (ref >60)
GFR, Estimated: 12 mL/min — ABNORMAL LOW (ref >60)
Glucose, Bld: 157 mg/dL — ABNORMAL HIGH (ref 70–99)
Glucose, Bld: 143 mg/dL — ABNORMAL HIGH (ref 70–99)
Phosphorus: 2.5 mg/dL (ref 2.5–4.6)
Phosphorus: 2.6 mg/dL (ref 2.5–4.6)
Potassium: 2.9 mmol/L — ABNORMAL LOW (ref 3.5–5.1)
Potassium: 3.3 mmol/L — ABNORMAL LOW (ref 3.5–5.1)
Sodium: 145 mmol/L (ref 135–145)
Sodium: 147 mmol/L — ABNORMAL HIGH (ref 135–145)

## 2020-02-03 LAB — GLUCOSE, CAPILLARY
Glucose-Capillary: 148 mg/dL — ABNORMAL HIGH (ref 70–99)
Glucose-Capillary: 131 mg/dL — ABNORMAL HIGH (ref 70–99)
Glucose-Capillary: 87 mg/dL (ref 70–99)
Glucose-Capillary: 279 mg/dL — ABNORMAL HIGH (ref 70–99)
Glucose-Capillary: 53 mg/dL — ABNORMAL LOW (ref 70–99)
Glucose-Capillary: 91 mg/dL (ref 70–99)

## 2020-02-03 LAB — CBC
HCT: 26.1 % — ABNORMAL LOW (ref 39.0–52.0)
Hemoglobin: 7.9 g/dL — ABNORMAL LOW (ref 13.0–17.0)
MCH: 24.8 pg — ABNORMAL LOW (ref 26.0–34.0)
MCHC: 30.3 g/dL (ref 30.0–36.0)
MCV: 82.1 fL (ref 80.0–100.0)
Platelets: 195 10*3/uL (ref 150–400)
RBC: 3.18 MIL/uL — ABNORMAL LOW (ref 4.22–5.81)
RDW: 19.8 % — ABNORMAL HIGH (ref 11.5–15.5)
WBC: 16.2 10*3/uL — ABNORMAL HIGH (ref 4.0–10.5)
nRBC: 0 % (ref 0.0–0.2)

## 2020-02-03 LAB — PROCALCITONIN: Procalcitonin: 0.45 ng/mL

## 2020-02-03 MED ORDER — POTASSIUM CHLORIDE 20 MEQ PO PACK
20.0000 meq | PACK | Freq: Three times a day (TID) | ORAL | Status: DC
  Administered 2020-02-03: 20 meq via ORAL
  Filled 2020-02-03: qty 1

## 2020-02-03 MED ORDER — POTASSIUM CHLORIDE CRYS ER 20 MEQ PO TBCR
20.0000 meq | EXTENDED_RELEASE_TABLET | Freq: Three times a day (TID) | ORAL | Status: AC
  Administered 2020-02-03 (×2): 20 meq via ORAL
  Filled 2020-02-03 (×2): qty 1

## 2020-02-03 NOTE — Progress Notes (Signed)
PROGRESS NOTE    Paul Price  YQI:[id number] DOB: 1940-10-17 DOA: 02/01/2020 PCP: [name], MD   Brief Narrative: 80 year old with past medical history significant for paroxysmal A. fib, chronic kidney disease, hypertension, hyperlipidemia, BPH who presented to the ED complaining of shortness of breath and nausea.  Patient was recently discharged on 01/26/2020 after admission for upper GI bleed, AKI and Covid pneumonia.  Patient finished a course of breath then severe and is steroids.  He did not have oxygen requirement at that time.  Patient was evaluated by GI at that time and advised to hold Xarelto until 01/31/2020.  Patient did not resume Xarelto at home.  Patient was discharged with a creatinine of 3.7.  He presents with nausea vomiting, unable to keep anything down.  Patient admitted with poor oral intake, dehydration, hyponatremia, nausea and AKI.    Assessment & Plan:   Principal Problem:   AKI (acute kidney injury) ([place]) Active Problems:   HTN (hypertension)   Hyperlipidemia with target LDL less than 70   OSA on CPAP   PAF (paroxysmal atrial fibrillation) (HCC)   COVID-19 virus infection   Intractable nausea and vomiting   Hypernatremia   Elevated troponin  1-AKI on CKD stage IV: Recent creatinine at discharge was 3.7.  Presented with a creatinine of 4.5 Continue to hold Lasix. Appreciate nephrology evaluation recommending IV fluids. Creatinine  is stable at 4.4. Plan to continue with IV fluids.   2-Hyponatremia: Related to hypovolemia poor oral intake. Resolved With IV fluids.  3-hypokalemia: Replete orally.   4-HFrEF;  Echo 2021 ejection fraction 40 to 45% Monitor volume status Continue to hold IV Lasix  Nausea and vomiting; He denies abdominal pain. KUB negative for bowel obstruction Supportive care  Elevated troponin: Most likely demand ischemia and worsening renal function. Denies chest pain  Generalized weakness: PT eval  Hypertension:  Continue with Norvasc and Metoprolol  Covid positive, tested 01/20/2020 Completed a course of rhythm severe and IV steroid He will need to complete quarantine for 21 days  A. Fib: Per GI he was supposed to be with Xarelto 1/22. Hemoglobin is slowly trending down, continue to hold Xarelto for now.  UTI: Continue with ceftriaxone.  Follow urine culture.    Estimated body mass index is 28.54 kg/m as calculated from the following:   Height as of this encounter: 5\' 6"  (1.676 m).   Weight as of this encounter: 80.2 kg.   DVT prophylaxis: Scd Code Status: Full code Family Communication: care discussed with Son over phone.  Disposition Plan:  Status is: Inpatient  Remains inpatient appropriate because:Persistent severe electrolyte disturbances   Dispo: The patient is from: Home              Anticipated d/c is to: Home              Anticipated d/c date is: 3 days              Patient currently is not medically stable to d/c.   Difficult to place patient No        Consultants:   Nephrology,.   Procedures:  none Antimicrobials:  Ceftriaxone   Subjective: Patient still having nausea and vomiting.  Denies abdominal pain.    Objective: Vitals:   02/03/20 0000 02/03/20 0340 02/03/20 0723 02/03/20 1243  BP: (!) 153/59 (!) 146/63 139/62 140/66  Pulse: 62 62 63 70  Resp: 19 18 18 18   Temp: 100.2 F (37.9 C) 98.1 F (36.7 C) 98.6  F (37 C) 98.7 F (37.1 C)  TempSrc: Oral Oral Axillary Axillary  SpO2: 95% 95% 96% 97%  Weight:      Height:        Intake/Output Summary (Last 24 hours) at 02/03/2020 1418 Last data filed at 02/03/2020 0547 Gross per 24 hour  Intake 1990 ml  Output --  Net 1990 ml   Filed Weights   02/02/20 1952  Weight: 80.2 kg    Examination:  General exam: Appears calm and comfortable  Respiratory system: Clear to auscultation. Respiratory effort normal. Cardiovascular system: S1 & S2 heard, RRR. No JVD, murmurs, rubs, gallops or clicks.  No pedal edema. Gastrointestinal system: Abdomen is nondistended, soft and nontender. No organomegaly or masses felt. Normal bowel sounds heard. Central nervous system: Alert and oriented.  Extremities: Symmetric 5 x 5 power.    Data Reviewed: I have personally reviewed following labs and imaging studies  CBC: Recent Labs  Lab 02/01/20 1618 02/02/20 0055 02/03/20 0239  WBC 16.8* 17.3* 16.2*  NEUTROABS 14.5*  --   --   HGB 9.2* 8.5* 7.9*  HCT 31.2* 29.7* 26.1*  MCV 84.1 85.6 82.1  PLT 249 226 856   Basic Metabolic Panel: Recent Labs  Lab 02/01/20 1618 02/02/20 0055 02/03/20 0239  NA 152* 150* 145  K 3.8 3.0* 2.9*  CL 117* 117* 112*  CO2 24 23 23   GLUCOSE 145* 155* 157*  BUN 66* 65* 58*  CREATININE 4.55* 4.55* 4.47*  CALCIUM 8.4* 8.0* 7.8*  MG  --  2.1  --   PHOS  --   --  2.5   GFR: Estimated Creatinine Clearance: 12.9 mL/min (A) (by C-G formula based on SCr of 4.47 mg/dL (H)). Liver Function Tests: Recent Labs  Lab 02/01/20 1618 02/02/20 0055 02/03/20 0239  AST 28 28  --   ALT 24 22  --   ALKPHOS 126 109  --   BILITOT 0.8 0.9  --   PROT 5.9* 5.3*  --   ALBUMIN 2.4* 2.2* 1.9*   No results for input(s): LIPASE, AMYLASE in the last 168 hours. No results for input(s): AMMONIA in the last 168 hours. Coagulation Profile: No results for input(s): INR, PROTIME in the last 168 hours. Cardiac Enzymes: No results for input(s): CKTOTAL, CKMB, CKMBINDEX, TROPONINI in the last 168 hours. BNP (last 3 results) No results for input(s): PROBNP in the last 8760 hours. HbA1C: No results for input(s): HGBA1C in the last 72 hours. CBG: Recent Labs  Lab 02/02/20 1212 02/02/20 1729 02/02/20 2111 02/03/20 0722 02/03/20 1230  GLUCAP 145* 106* 148* 131* 87   Lipid Profile: No results for input(s): CHOL, HDL, LDLCALC, TRIG, CHOLHDL, LDLDIRECT in the last 72 hours. Thyroid Function Tests: No results for input(s): TSH, T4TOTAL, FREET4, T3FREE, THYROIDAB in the last 72  hours. Anemia Panel: No results for input(s): VITAMINB12, FOLATE, FERRITIN, TIBC, IRON, RETICCTPCT in the last 72 hours. Sepsis Labs: Recent Labs  Lab 02/02/20 0055 02/03/20 0239  PROCALCITON 0.36 0.45    No results found for this or any previous visit (from the past 240 hour(s)).       Radiology Studies: DG Abd 1 View  Result Date: 02/03/2020 CLINICAL DATA:  Vomiting EXAM: ABDOMEN - 1 VIEW COMPARISON:  02/26/2019 FINDINGS: The bowel gas pattern is normal. Multiple calcified gallstones. Degenerative changes of the bilateral hips. IMPRESSION: 1. Nonobstructive bowel gas pattern. 2. Cholelithiasis. Electronically Signed   By: Davina Poke D.O.   On: 02/03/2020 12:33   DG  Chest Port 1 View  Result Date: 02/01/2020 CLINICAL DATA:  Recently discharged with COVID-19.  Short of breath. EXAM: PORTABLE CHEST 1 VIEW COMPARISON:  01/23/2020 FINDINGS: Dual lead pacer. Midline trachea. Moderate cardiomegaly. No pleural effusion or pneumothorax. Mild interstitial thickening, likely related to remote smoking history. Improved left-sided aeration, without residual well-defined lobar consolidation. IMPRESSION: Improved left-sided aeration. Cannot exclude mild residual interstitial opacities which could represent scarring or pneumonia. Cardiomegaly without congestive failure. Electronically Signed   By: Abigail Miyamoto M.D.   On: 02/01/2020 17:01        Scheduled Meds: . amLODipine  2.5 mg Oral Daily  . atorvastatin  20 mg Oral Daily  . ferrous sulfate  325 mg Oral BID WC  . insulin aspart  0-15 Units Subcutaneous TID WC  . insulin aspart  0-5 Units Subcutaneous QHS  . metoprolol succinate  25 mg Oral Daily  . pantoprazole  40 mg Oral BID  . potassium chloride  20 mEq Oral TID  . sodium bicarbonate  650 mg Oral TID  . tamsulosin  0.4 mg Oral Daily   Continuous Infusions: . cefTRIAXone (ROCEPHIN)  IV 2 g (02/03/20 0912)  . dextrose 5 % and 0.45% NaCl 125 mL/hr at 02/03/20 0113     LOS:  1 day    Time spent: 35 minutes    Grae Cannata A Kimi Kroft, MD [facility]   If 7PM-7AM, please contact night-coverage [email or web address]  02/03/2020, 2:18 PM

## 2020-02-03 NOTE — Progress Notes (Signed)
CSW received call from patient's daughter, Paul Price. She is requesting medical update on patient. She has called front desk and was notified that one family member needs to be the point person for updates, which is her brother. CSW reiterated that RN must answer medical questions. She asked CSW to see if patient can come of isolation so family can visit. CSW confirmed with MD that patient requires 21 days of isolation (first positive on 1/11). CSW updated patient's daughter. She requests staff contact her if patient attempts to leave AMA.   Tavis Kring LCSW

## 2020-02-03 NOTE — Progress Notes (Signed)
[facility] Progress Note   Subjective:    Sodium improved 150 to 145 overnight, BUN 65 > 58, Cr 4.55 > 4.47, I/Os 2L in none documented out.  Per notes he vomited promptly after oral meds last night. Not eating.  No new complaints today.  son [name] [phone] updated on the phone today at 4pm  Objective Vitals:   02/02/20 1952 02/03/20 0000 02/03/20 0340 02/03/20 0723  BP: (!) 142/59 (!) 153/59 (!) 146/63 139/62  Pulse: 62 62 62 63  Resp: _0 Temp: 100 F (37.8 C) 100.2 F (37.9 C) 98.1 F (36.7 C) 98.6 F (37 C)  TempSrc: Oral Oral Oral Axillary  SpO2: 95% 95% 95% 96%  Weight: 80.2 kg     Height: 5' 6" (1.676 m)      Physical Exam Gen: elderly man at 45 deg in bed - nontoxic but sleepy   Eyes: anicteric, EOMI ENT: MM dry Neck: supple, no JVD CV:  RRR, paced on monitor, no rub Abd:  Soft, nontender Back: clear lungs with normal WOB GU: condom cath with 1104m in can - looks ill fitting.   Extr:  No edema Neuro: some tremors noted but no asterixis; hard of hearing  Additional Objective Labs: Basic Metabolic Panel: Recent Labs  Lab 02/01/20 1618 02/02/20 0055 02/03/20 0239  NA 152* 150* 145  K 3.8 3.0* 2.9*  CL 117* 117* 112*  CO2 _1 GLUCOSE 145* 155* 157*  BUN 66* 65* 58*  CREATININE 4.55* 4.55* 4.47*  CALCIUM 8.4* 8.0* 7.8*  PHOS  --   --  2.5   Liver Function Tests: Recent Labs  Lab 02/01/20 1618 02/02/20 0055 02/03/20 0239  AST 28 28  --   ALT 24 22  --   ALKPHOS 126 109  --   BILITOT 0.8 0.9  --   PROT 5.9* 5.3*  --   ALBUMIN 2.4* 2.2* 1.9*   No results for input(s): LIPASE, AMYLASE in the last 168 hours. CBC: Recent Labs  Lab 02/01/20 1618 02/02/20 0055 02/03/20 0239  WBC 16.8* 17.3* 16.2*  NEUTROABS 14.5*  --   --   HGB 9.2* 8.5* 7.9*  HCT 31.2* 29.7* 26.1*  MCV 84.1 85.6 82.1  PLT 249 226 195   Blood Culture    Component Value Date/Time   SDES BLOOD RIGHT HAND 01/21/2020 0828   SPECREQUEST   01/21/2020 0828    BOTTLES DRAWN AEROBIC AND ANAEROBIC Blood Culture results may not be optimal due to an inadequate volume of blood received in culture bottles   CULT  01/21/2020 0828    NO GROWTH 5 DAYS [id number], [place] [id number]   REPTSTATUS 01/26/2020 FINAL 01/21/2020 0828    Cardiac Enzymes: No results for input(s): CKTOTAL, CKMB, CKMBINDEX, TROPONINI in the last 168 hours. CBG: Recent Labs  Lab 02/02/20 0833 02/02/20 1212 02/02/20 1729 02/02/20 2111 02/03/20 0722  GLUCAP 139* 145* 106* 148* 131*   Iron Studies: No results for input(s): IRON, TIBC, TRANSFERRIN, FERRITIN in the last 72 hours. _2 @ Studies/Results: DG Chest Port 1 View  Result Date: 02/01/2020 CLINICAL DATA:  Recently discharged with COVID-19.  Short of breath. EXAM: PORTABLE CHEST 1 VIEW COMPARISON:  01/23/2020 FINDINGS: Dual lead pacer. Midline trachea. Moderate cardiomegaly. No pleural effusion or pneumothorax. Mild interstitial thickening, likely related to remote smoking history. Improved left-sided aeration, without residual well-defined lobar consolidation. IMPRESSION: Improved left-sided aeration. Cannot exclude  mild residual interstitial opacities which could represent scarring or pneumonia. Cardiomegaly without congestive failure. Electronically Signed   By: Kyle  Talbot M.D.   On: 02/01/2020 17:01   Medications: . cefTRIAXone (ROCEPHIN)  IV 2 g (02/03/20 0912)  . dextrose 5 % and 0.45% NaCl 125 mL/hr at 02/03/20 0113   . amLODipine  2.5 mg Oral Daily  . atorvastatin  20 mg Oral Daily  . ferrous sulfate  325 mg Oral BID WC  . insulin aspart  0-15 Units Subcutaneous TID WC  . insulin aspart  0-5 Units Subcutaneous QHS  . metoprolol succinate  25 mg Oral Daily  . pantoprazole  40 mg Oral BID  . potassium chloride  20 mEq Oral TID  . sodium bicarbonate  650 mg Oral TID  . tamsulosin  0.4 mg Oral Daily   Assessment/Plan **AKI on CKD 5:   Appears most likely prerenal from poor po intake/emesis prior to admission.  With BUN in the 60s I doubt this is uremia driving the GI symptoms but could be contributing; will check 24h CrCl to ensure eGFR not a overestimation and if it is we could proceed with dialysis.  For now will continue to hydrate and follow.    If renal function improves with hydration then we will need to prove he can maintain hydration prior to discharge.  We will limit nephrotoxins and avoid hypotension as well.   Discussed with pt and son via phone.  Jeff [phone].  **Nausea/emesis:  I suspect this is an issue leading to the AKI but could be uremia.  It would be unusual to have severe nausea with this BUN.  ? COVID related.  Being treated with antiemetics.  Abd X ray today unrevealing.  24h collection of urine per above.  **Anemia:  On po iron, trend H/H; no indication for transfusion currently but into 7s now.  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  **Hypernatremia:  resolved with free water administration  **Hypokalemia:  Further repleted, may need IV if unable to tolerate po.   **recent COVID:  S/p steroids and remdesivir last admission; CXR improved, ok on RA.   Precautions per [facility] policy.   **A fib:  Paced currently, xarelto on hold for now -- would consider coumadin or eliquis if resumed this admission given eGFR < 15.   Lindsay Kruska MD 02/03/2020, 11:44 AM  [facility] Pager: [phone]   

## 2020-02-03 NOTE — Progress Notes (Signed)
Unable to insert 12g French foley catheter x1 attempt (for 24hr urine collection) due to pt anatomy, paged night MD on for nephrology , Justin Mend, MD vocalized that it was acceptable to collect urine via condom cath. Condom cath now on pt, will begin 24hr urine collection.

## 2020-02-03 NOTE — TOC Initial Note (Signed)
Transition of Care [facility]) - Initial/Assessment Note    Patient Details  Name: Paul Price MRN: [id number] Date of Birth: [birth year]  Transition of Care [facility]) CM/SW Contact:    Carles Collet, RN Phone Number: 02/03/2020, 2:25 PM  Clinical Narrative:           Verified w [facility] liaison that patient is active for [facility] PT OT RN, and they will follow again after DC. Please place [place] orders at time of DC.          Expected Discharge Plan: [place] Barriers to Discharge: Continued Medical Work up   Patient Goals and CMS Choice Patient states their goals for this hospitalization and ongoing recovery are:: to go home      Expected Discharge Plan and Services Expected Discharge Plan: [place]   Discharge Planning Services: CM Consult Post Acute Care Choice: Home Health                             HH Arranged: PT,OT,RN [place]: [facility] Date [place]: 02/03/20 Time HH Agency Contacted: 7 Representative spoke with at [place]: Amy  Prior Living Arrangements/Services   Lives with:: Spouse                   Activities of Daily Living      Permission Sought/Granted                  Emotional Assessment              Admission diagnosis:  Hypernatremia [E87.0] SOB (shortness of breath) [R06.02] AKI (acute kidney injury) ([place]) [N17.9] COVID-19 [U07.1] Patient Active Problem List   Diagnosis Date Noted  . Intractable nausea and vomiting 02/01/2020  . Hypernatremia 02/01/2020  . Elevated troponin 02/01/2020  . UGI bleed 01/22/2020  . COVID-19 virus infection 01/21/2020  . Upper GI bleed 01/20/2020  . Melena 11/16/2019  . Symptomatic anemia 11/14/2019  . Stage 5 chronic kidney disease ([place]) 11/14/2019  . Anemia in chronic kidney disease 08/18/2019  . Renal mass 03/31/2019  . Acute on chronic systolic CHF (congestive heart failure) ([place])   . 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  Symptomatic bradycardia 12/21/2016  . Systolic congestive heart failure ([place]) 12/20/2016  . Normocytic anemia 09/06/2016  . Upper GI bleeding 09/06/2016  . Acute blood loss anemia   . New onset a-fib ([place]) 08/08/2016  . Chronic diastolic (congestive) heart failure ([place]) 08/08/2016  . Hypotension 08/08/2016  . A-fib ([place]) 08/08/2016  . Primary gout   . 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 Syncope 12/02/2013  . Left leg DVT ([place]) 12/02/2013  . Fever 12/02/2013  . AKI (acute kidney injury) ([place]) 12/02/2013  . Renal insufficiency 07/02/2013  . PAF (paroxysmal atrial fibrillation) ([place]) 01/04/2013  . Sinus pause, 4.3 seconds.  Recorded by Cardion[facility]. 12/02/2012  . NSVT (nonsustained ventricular tachycardia) ([place]) 12/02/2012  . Dyspnea on exertion 11/20/2012  . 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 HTN (hypertension) 08/20/2012  . Hyperlipidemia with target LDL less than 70 08/20/2012  . Obesity (BMI 30-39.9) 08/20/2012  . OSA on CPAP 08/20/2012  . RBBB 08/20/2012   PCP:  [name], MD Pharmacy:   [facility], [address] [place] [address] Phone: [phone] Fax:  [facility] [place], [place] - [address] [place] [address] Phone: [phone] Fax: [phone]     Social Determinants of Health ([place]) Interventions    Readmission Risk Interventions No flowsheet data found.

## 2020-02-03 NOTE — Evaluation (Signed)
Clinical/Bedside Swallow Evaluation Patient Details  Name: Paul Price MRN: [id number] Date of Birth: Jun 16, 1940  Today's Date: 02/03/2020 Time: SLP Start Time (ACUTE ONLY): 53 SLP Stop Time (ACUTE ONLY): 1450 SLP Time Calculation (min) (ACUTE ONLY): 14 min  Past Medical History:  Past Medical History:  Diagnosis Date  . Arthritis    "shoulders" (09/07/2016)  . Atrial fibrillation ([place])   . BPH (benign prostatic hyperplasia)   . Chronic kidney disease (CKD), stage III (moderate) (HCC)   . Coronary artery disease   . DDD (degenerative disc disease), cervical   . DVT (deep venous thrombosis) ([place]) 12/2013   LLE  . GERD (gastroesophageal reflux disease)   . Gout   . High cholesterol   . History of blood transfusion 09/06/2016   2 u PRBC  . History of scarlet fever 1940s  . History of stress test 06/2011   No significant ischemia, this is a low risk scan. Clinical correlation recommended Abnormal myocardial perfusion study.  Marland Kitchen Hx of echocardiogram 05/2009   EF 40-45%, he did have mild annular calcification with mild-to-moderate MR and mild TR as well as aortic valve sclerosis. Estimated RV systolic pressure was 21 mm.  . Hypertension   . Iron deficiency anemia   . Kidney failure   . 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 Neck pain    "not chronic" (09/07/2016)  . Obesity   . OSA on CPAP   . RBBB   . Renal insufficiency   . Right renal mass   . Spinal stenosis of lumbar region    Past Surgical History:  Past Surgical History:  Procedure Laterality Date  . APPENDECTOMY    . CARDIAC CATHETERIZATION  2009   he was found to have a atretic LIMA graft to his LAD and had a patent vein graft supplying his diagonal vessel. At that time, he had 70% narrowing in his LAD beyond a diagonal vessel which was initially treated medically.  . COLONOSCOPY WITH PROPOFOL Left 09/09/2016   Procedure: COLONOSCOPY WITH PROPOFOL;  Surgeon: Arta Silence, MD;  Location: [facility];   Service: Endoscopy;  Laterality: Left;  . CORONARY ANGIOPLASTY WITH STENT PLACEMENT  April 2012   LAD DES  . CORONARY ARTERY BYPASS GRAFT  1992   with LIMA to the LAD and diagonal.  . CYSTOSCOPY N/A 03/31/2019   Procedure: CYSTOSCOPY FLEXIBLE;  Surgeon: Lucas Mallow, MD;  Location: [facility];  Service: Urology;  Laterality: N/A;  . CYSTOSCOPY WITH URETHRAL DILATATION N/A 05/21/2019   Procedure: CYSTOSCOPY WITH URETHRAL BALLOON DILATATION;  Surgeon: Lucas Mallow, MD;  Location: [facility];  Service: Urology;  Laterality: N/A;  . ESOPHAGOGASTRODUODENOSCOPY N/A 11/16/2019   Procedure: ESOPHAGOGASTRODUODENOSCOPY (EGD);  Surgeon: Wilford Corner, MD;  Location: [facility];  Service: Endoscopy;  Laterality: N/A;  . ESOPHAGOGASTRODUODENOSCOPY (EGD) WITH PROPOFOL Left 09/08/2016   Procedure: ESOPHAGOGASTRODUODENOSCOPY (EGD) WITH PROPOFOL;  Surgeon: Ronnette Juniper, MD;  Location: [facility];  Service: Gastroenterology;  Laterality: Left;  . GIVENS CAPSULE STUDY N/A 11/16/2019   Procedure: GIVENS CAPSULE STUDY;  Surgeon: Wilford Corner, MD;  Location: [facility];  Service: Endoscopy;  Laterality: N/A;  . INGUINAL HERNIA REPAIR     "don't remeimber which side"  . LAPAROSCOPIC NEPHRECTOMY Right 03/31/2019   Procedure: RIGHT  HAND ASSIST LAPAROSCOPIC NEPHRECTOMY;  Surgeon: Lucas Mallow, MD;  [facility];  Service: Urology;  Laterality: Right;  . PACEMAKER IMPLANT N/A 12/25/2016   Procedure: PACEMAKER IMPLANT;  Surgeon: Caryl Comes,  Revonda Standard, MD;  Location: [facility];  Service: Cardiovascular;  Laterality: N/A;  . TONSILLECTOMY     HPI:  Pt is an 80 y.o. male with history of paroxysmal atrial fibrillation, chronic kidney disease, hypertension, hyperlipidemia, BPH who presented to the ED with a complaint of shortness of breath and nausea. Pt was just discharged 01/26/2020 after an admission for upper GI bleeding, AKI, COVID-pneumonia. CXR 1/23: Improved left-sided aeration. Cannot  exclude mild residual interstitial opacities which could represent scarring or pneumonia.   Assessment / Plan / Recommendation Clinical Impression  Pt was seen for bedside swallow evaluation. He stated that he has to "take it slow" if not he "gets to coughing and choking". Per the pt, he has more difficulty with liquids than with liquids. Oral mechanism exam was [facility] and dentition was adequate. He demonstrated a delayed cough after all trials, but no other symptoms of oropharyngeal dysphagia were noted. A regular texture diet with thin liquids is recommended at this time. Considering pt's reports and the delayed cough noted, SLP will follow briefly to ensure diet tolerance. SLP Visit Diagnosis: Dysphagia, unspecified (R13.10)    Aspiration Risk  Mild aspiration risk    Diet Recommendation Regular;Thin liquid   Liquid Administration via: Straw;Cup Medication Administration: Whole meds with liquid Supervision: Patient able to self feed Compensations: Slow rate;Small sips/bites Postural Changes: Seated upright at 90 degrees    Other  Recommendations Oral Care Recommendations: Oral care BID   Follow up Recommendations  (TBD)      Frequency and Duration min 2x/week  1 week       Prognosis Prognosis for Safe Diet Advancement: Good      Swallow Study   General Date of Onset: 02/02/20 HPI: Pt is an 80 y.o. male with history of paroxysmal atrial fibrillation, chronic kidney disease, hypertension, hyperlipidemia, BPH who presented to the ED with a complaint of shortness of breath and nausea. Pt was just discharged 01/26/2020 after an admission for upper GI bleeding, AKI, COVID-pneumonia. CXR 1/23: Improved left-sided aeration. Cannot exclude mild residual interstitial opacities which could represent scarring or pneumonia. Type of Study: Bedside Swallow Evaluation Previous Swallow Assessment: None Diet Prior to this Study: Regular;Thin liquids Temperature Spikes Noted: No Respiratory  Status: Room air History of Recent Intubation: No Behavior/Cognition: Alert;Cooperative;Pleasant mood Oral Cavity Assessment: Within Functional Limits Oral Care Completed by SLP: No Oral Cavity - Dentition: Adequate natural dentition Vision: Functional for self-feeding Self-Feeding Abilities: Needs assist Patient Positioning: Upright in bed Baseline Vocal Quality: Normal Volitional Cough: Strong Volitional Swallow: Able to elicit    Oral/Motor/Sensory Function Overall Oral Motor/Sensory Function: Within functional limits   Ice Chips Ice chips: Within functional limits Presentation: Spoon   Thin Liquid Thin Liquid: Within functional limits Presentation: Straw    Nectar Thick Nectar Thick Liquid: Not tested   Honey Thick Honey Thick Liquid: Not tested   Puree Puree: Within functional limits Presentation: Spoon Other Comments:  (Delayed cough noted at end of evaluation. Possibly unrelated.)   Shanika I. Hardin Negus, [place], [place] Office number [phone] Pager [phone]    Horton Marshall 02/03/2020,2:54 PM

## 2020-02-04 ENCOUNTER — Encounter (HOSPITAL_COMMUNITY): Payer: Self-pay | Admitting: Family Medicine

## 2020-02-04 ENCOUNTER — Other Ambulatory Visit: Payer: Self-pay

## 2020-02-04 DIAGNOSIS — N185 Chronic kidney disease, stage 5: Secondary | ICD-10-CM

## 2020-02-04 DIAGNOSIS — N179 Acute kidney failure, unspecified: Secondary | ICD-10-CM | POA: Diagnosis not present

## 2020-02-04 DIAGNOSIS — I10 Essential (primary) hypertension: Secondary | ICD-10-CM | POA: Diagnosis not present

## 2020-02-04 DIAGNOSIS — U071 COVID-19: Secondary | ICD-10-CM | POA: Diagnosis not present

## 2020-02-04 LAB — RENAL FUNCTION PANEL
Albumin: 1.8 g/dL — ABNORMAL LOW (ref 3.5–5.0)
Anion gap: 13 (ref 5–15)
BUN: 55 mg/dL — ABNORMAL HIGH (ref 8–23)
CO2: 20 mmol/L — ABNORMAL LOW (ref 22–32)
Calcium: 7.8 mg/dL — ABNORMAL LOW (ref 8.9–10.3)
Chloride: 113 mmol/L — ABNORMAL HIGH (ref 98–111)
Creatinine, Ser: 4.58 mg/dL — ABNORMAL HIGH (ref 0.61–1.24)
GFR, Estimated: 12 mL/min — ABNORMAL LOW (ref >60)
Glucose, Bld: 116 mg/dL — ABNORMAL HIGH (ref 70–99)
Phosphorus: 3 mg/dL (ref 2.5–4.6)
Potassium: 3.8 mmol/L (ref 3.5–5.1)
Sodium: 146 mmol/L — ABNORMAL HIGH (ref 135–145)

## 2020-02-04 LAB — HEMOGLOBIN A1C
Hgb A1c MFr Bld: 6.1 % — ABNORMAL HIGH (ref 4.8–5.6)
Mean Plasma Glucose: 128.37 mg/dL

## 2020-02-04 LAB — CBC
HCT: 25.6 % — ABNORMAL LOW (ref 39.0–52.0)
Hemoglobin: 7.5 g/dL — ABNORMAL LOW (ref 13.0–17.0)
MCH: 25.2 pg — ABNORMAL LOW (ref 26.0–34.0)
MCHC: 29.3 g/dL — ABNORMAL LOW (ref 30.0–36.0)
MCV: 85.9 fL (ref 80.0–100.0)
Platelets: 178 10*3/uL (ref 150–400)
RBC: 2.98 MIL/uL — ABNORMAL LOW (ref 4.22–5.81)
RDW: 20.2 % — ABNORMAL HIGH (ref 11.5–15.5)
WBC: 12.6 10*3/uL — ABNORMAL HIGH (ref 4.0–10.5)
nRBC: 0 % (ref 0.0–0.2)

## 2020-02-04 LAB — GLUCOSE, CAPILLARY
Glucose-Capillary: 129 mg/dL — ABNORMAL HIGH (ref 70–99)
Glucose-Capillary: 146 mg/dL — ABNORMAL HIGH (ref 70–99)
Glucose-Capillary: 119 mg/dL — ABNORMAL HIGH (ref 70–99)
Glucose-Capillary: 117 mg/dL — ABNORMAL HIGH (ref 70–99)

## 2020-02-04 MED ORDER — CHLORHEXIDINE GLUCONATE CLOTH 2 % EX PADS
6.0000 | MEDICATED_PAD | Freq: Every day | CUTANEOUS | Status: DC
  Administered 2020-02-05 – 2020-02-09 (×5): 6 via TOPICAL

## 2020-02-04 MED ORDER — INSULIN ASPART 100 UNIT/ML ~~LOC~~ SOLN: 0.0000 [IU] | Freq: Three times a day (TID) | SUBCUTANEOUS | Status: DC

## 2020-02-04 NOTE — Consult Note (Signed)
Paul Price for Infectious Disease    Date of Admission:  02/01/2020     Total days of antibiotics 3  Ceftriaxone               Reason for Consult: ?staph aureus in the urine    Referring Provider: [facility]  Primary Care Provider: [name], MD   Assessment: Paul Price is a 80 y.o. male with recent history of COVID Pneumonia (treated with steroids and Remdesivir course) and upper GIB discharged home 1/17 without supplemental oxygen.  Noted to have had pretty sudden nausea and vomiting that started shortly after discharge without fevers, or chills. Admission it was noted he had acute on chronic kidney disease, now seems to be stage V, readying him for dialysis with temp HD line tonight and AVF vs Graft Friday scheduled.   Noted to have leukocytosis and low grade temp 100.2 on admission. He had staphylococcus aureus noted in the urine on a culture with admission. He has improved on ceftriaxone for presumed UTI given positive culture (no symptoms at present nor prior to admission). Staph in the urine is fairly atypical bacteria for UTI cause and I worry he was actually bacteremic at presentation to the hospital with bacteriuria potentially a secondary finding related to that. Given improvement on Ceftriaxone he likely has MSSA contributing. Will change to Cefazolin daily while HD plans remain uncertain at this time. Will check blood cultures now given suspicion and with temp HD line going in soon.   Complicating this is his PPM. If he was bacteremic he does not have a clear reason for this on exam. For thoroughness he should probably undergo evaluation with echo lead wires and native valves. TEE would be more sensitive however COVID recently complicates this. He had no hypoxia during hospitalization on 1/13 when diagnosed, so 10 days may be sufficient. Will d/w Dr. Baxter Flattery further for recommendations.   May need to put a hold on plan for AVF/G Friday.    Plan: 1. Change to  cefazolin  2. Follow urine susceptibilities to ensure no mismatch 3. Check blood cultures now  4. TTE --> best for TEE given PPM  5. Continue COVID isolation for now   Principal Problem:   AKI (acute kidney injury) ([place]) Active Problems:   HTN (hypertension)   Hyperlipidemia with target LDL less than 70   OSA on CPAP   PAF (paroxysmal atrial fibrillation) (HCC)   COVID-19 virus infection   Intractable nausea and vomiting   Hypernatremia   Elevated troponin   . amLODipine  2.5 mg Oral Daily  . atorvastatin  20 mg Oral Daily  . [START ON 02/05/2020] Chlorhexidine Gluconate Cloth  6 each Topical Q0600  . ferrous sulfate  325 mg Oral BID WC  . insulin aspart  0-6 Units Subcutaneous TID WC  . metoprolol succinate  25 mg Oral Daily  . pantoprazole  40 mg Oral BID  . sodium bicarbonate  650 mg Oral TID  . tamsulosin  0.4 mg Oral Daily    HPI: Paul Price is a 80 y.o. male admitted from home to evaluate nausea, vomiting and fatigue that started at home after he was discharged from the hospital for acute COVID PNA and UGIB recently.   PMHx significant for CKDIV, chronic anemia, arthritis, atrial fib s/p PPM, BPH, CABG 92, DVT 2015 on chronic AC, GERD, gout, HLD, CHF, HTN, OSA on CPAP, R renal mass s/p nephrectomy.   Admitted  1/11 for melena and lightheadedness. Also with fevers and mild URI symptoms c/w COVID at the time but progressively worsened cough. Never hypoxic. Treated with short course steroids and remdesivir with D/C home on room air. Anticoagulation was scheduled to hold until 1/22 however he did not resume this due to vomiting. This started in the time short time frame after he was home, different from what he experienced with UGIB before. Does not recall having any fevers during this time with the nausea and vomiting; no shivering, chills or sweating. Did notice some dyspnea and reduced urine output over this time. No fevers, chills, shivering or sweating. No abdominal pain,  diarrhea, dysuria.    States he feels better since admission but "not back to where he was prior to [place]." has a PPM in place to L chest without any problems to declare. Had a "good report" from Echo in December and recent transmission to EP.  He is able to eat today and hold food and fluids down.  He tells me that his team is preparing to start him on intermittent hemodialysis this admission with temporary catheter today and fistula later this week. He is not looking forward to this but understands it has been "on the horizon" for a while.  No painful previous IV sites to his description. No wounds to feet. He is insensate and does check feet or have family check frequently. No rashes noted.  He was treated in 2016 for vertebral infection (culture negative) with Dr. Megan Salon and recalls he was a nice doctor that did a good job taking care of him.    Review of Systems: Review of Systems  Constitutional: Negative for chills, diaphoresis and fever.  HENT: Negative for sore throat.   Eyes: Negative for blurred vision.  Respiratory: Positive for shortness of breath. Negative for cough.   Cardiovascular: Negative for chest pain and leg swelling.  Gastrointestinal: Positive for nausea and vomiting. Negative for abdominal pain and diarrhea.  Genitourinary: Negative for dysuria and flank pain. Frequency: decreased   Musculoskeletal: Negative for back pain, falls, joint pain, myalgias and neck pain.  Skin: Negative for rash.  Neurological: Negative for dizziness.    Past Medical History:  Diagnosis Date  . Arthritis    "shoulders" (09/07/2016)  . Atrial fibrillation ([place])   . BPH (benign prostatic hyperplasia)   . Chronic kidney disease (CKD), stage III (moderate) (HCC)   . Coronary artery disease   . DDD (degenerative disc disease), cervical   . DVT (deep venous thrombosis) ([place]) 12/2013   LLE  . GERD (gastroesophageal reflux disease)   . Gout   . High cholesterol   . History of blood  transfusion 09/06/2016   2 u PRBC  . History of scarlet fever 1940s  . History of stress test 06/2011   No significant ischemia, this is a low risk scan. Clinical correlation recommended Abnormal myocardial perfusion study.  Marland Kitchen Hx of echocardiogram 05/2009   EF 40-45%, he did have mild annular calcification with mild-to-moderate MR and mild TR as well as aortic valve sclerosis. Estimated RV systolic pressure was 21 mm.  . Hypertension   . Iron deficiency anemia   . Kidney failure   . 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 Neck pain    "not chronic" (09/07/2016)  . Obesity   . OSA on CPAP   . RBBB   . Renal insufficiency   . Right renal mass   . Spinal stenosis of lumbar region  Social History   Tobacco Use  . Smoking status: Former Smoker    Packs/day: 1.00    Years: 30.00    Pack years: 30.00    Types: Cigarettes  . Smokeless tobacco: Never Used  . Tobacco comment: quit ~ 1985  Vaping Use  . Vaping Use: Never used  Substance Use Topics  . Alcohol use: Yes    Alcohol/week: 0.0 standard drinks    Comment: rare beer  . Drug use: No    Family History  Problem Relation Age of Onset  . Heart disease Father    Allergies  Allergen Reactions  . Simvastatin Other (See Comments)    Abdominal cramps  . Nitrostat [Nitroglycerin] Other (See Comments)    Causes blood pressure to "bottom out"    OBJECTIVE: Blood pressure 137/69, pulse 62, temperature 98.2 F (36.8 C), temperature source Oral, resp. rate 18, height 5\' 6"  (1.676 m), weight 80.2 kg, SpO2 97 %.  Physical Exam Constitutional:      Appearance: He is well-developed.     Comments: Sitting upright in bed eating lunch   Cardiovascular:     Rate and Rhythm: Normal rate and regular rhythm.     Heart sounds: No murmur heard.   Pulmonary:     Effort: Pulmonary effort is normal.     Breath sounds: Normal breath sounds.  Chest:     Chest wall: No mass.     Comments: PPM pocket unremarkable  Abdominal:      General: Bowel sounds are normal.     Palpations: Abdomen is soft.  Musculoskeletal:        General: Normal range of motion.  Skin:    General: Skin is warm and dry.     Capillary Refill: Capillary refill takes less than 2 seconds.     Findings: No rash.     Comments: L AC PIV appears to be leaking and slightly swollen to me vs R   Neurological:     Mental Status: He is alert and oriented to person, place, and time.     Lab Results Lab Results  Component Value Date   WBC 12.6 (H) 02/04/2020   HGB 7.5 (L) 02/04/2020   HCT 25.6 (L) 02/04/2020   MCV 85.9 02/04/2020   PLT 178 02/04/2020    Lab Results  Component Value Date   CREATININE 4.58 (H) 02/04/2020   BUN 55 (H) 02/04/2020   NA 146 (H) 02/04/2020   K 3.8 02/04/2020   CL 113 (H) 02/04/2020   CO2 20 (L) 02/04/2020    Lab Results  Component Value Date   ALT 22 02/02/2020   AST 28 02/02/2020   ALKPHOS 109 02/02/2020   BILITOT 0.9 02/02/2020     Microbiology: Recent Results (from the past 240 hour(s))  Urine culture     Status: Abnormal (Preliminary result)   Collection Time: 02/01/20  5:13 PM   Specimen: Urine, Random  Result Value Ref Range Status   Specimen Description URINE, RANDOM  Final   Special Requests NONE  Final   Culture (A)  Final    >=100,000 COLONIES/mL STAPHYLOCOCCUS AUREUS SUSCEPTIBILITIES TO FOLLOW Performed at [facility], [address] [place], [place] [address]    Report Status PENDING  Incomplete     Janene Madeira, MSN, NP-C [place] for Infectious Disease [email or web address] Pager: [phone] Office: [phone] [place]: [phone]

## 2020-02-04 NOTE — Progress Notes (Signed)
CSW received call from patient's daughter. She requested CSW have RN charge patient's cell phone and make sure he can get back to the bed when he wants. CSW sent request to RN.   Carrah Eppolito LCSW

## 2020-02-04 NOTE — Progress Notes (Signed)
Patient's cbg was 53. Given 118 ml of apple juice. Recheck cbg was 91.

## 2020-02-04 NOTE — Consult Note (Addendum)
Hospital Consult    Reason for Consult:  Permanent dialysis access Requesting Physician: Dr. Johnney Ou MRN #:  [id number]  History of Present Illness: This is a 80 y.o. male who [facility] has been asked to see for placement of permanent dialysis access. Patient has previously been seen and evaluated by Dr. Scot Dock in consultation during his previous admission in December of 2021. 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 He has been followed by Dr. Marval Regal. Due to uremic symptoms and worsening labs despite hydration Nephrology is starting hemodialysis on this admission. At the time of previous consultation by Dr. Scot Dock vein mapping was obtained and showed inadequate veins for vascular conduit so he likely will require AV graft. At the time due to the fact that he was not candidate for a fistula, Nephrology recommended holding off until hemodialysis was necessary. Due to having a left sided pacemaker the right upper extremity graft was previously discussed with patient. He is right handed  Past Medical History:  Diagnosis Date  . Arthritis    "shoulders" (09/07/2016)  . Atrial fibrillation ([place])   . BPH (benign prostatic hyperplasia)   . Chronic kidney disease (CKD), stage III (moderate) (HCC)   . Coronary artery disease   . DDD (degenerative disc disease), cervical   . DVT (deep venous thrombosis) ([place]) 12/2013   LLE  . GERD (gastroesophageal reflux disease)   . Gout   . High cholesterol   . History of blood transfusion 09/06/2016   2 u PRBC  . History of scarlet fever 1940s  . History of stress test 06/2011   No significant ischemia, this is a low risk scan. Clinical correlation recommended Abnormal myocardial perfusion study.  Marland Kitchen Hx of echocardiogram 05/2009   EF 40-45%, he did have mild annular calcification with mild-to-moderate MR and mild TR as well as aortic valve sclerosis. Estimated  RV systolic pressure was 21 mm.  . Hypertension   . Iron deficiency anemia   . Kidney failure   . 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 Neck pain    "not chronic" (09/07/2016)  . Obesity   . OSA on CPAP   . RBBB   . Renal insufficiency   . Right renal mass   . Spinal stenosis of lumbar region     Past Surgical History:  Procedure Laterality Date  . APPENDECTOMY    . CARDIAC CATHETERIZATION  2009   he was found to have a atretic LIMA graft to his LAD and had a patent vein graft supplying his diagonal vessel. At that time, he had 70% narrowing in his LAD beyond a diagonal vessel which was initially treated medically.  . COLONOSCOPY WITH PROPOFOL Left 09/09/2016   Procedure: COLONOSCOPY WITH PROPOFOL;  Surgeon: Arta Silence, MD;  Location: [facility];  Service: Endoscopy;  Laterality: Left;  . CORONARY ANGIOPLASTY WITH STENT PLACEMENT  April 2012   LAD DES  . CORONARY ARTERY BYPASS GRAFT  1992   with LIMA to the LAD and diagonal.  . CYSTOSCOPY N/A 03/31/2019   Procedure: CYSTOSCOPY FLEXIBLE;  Surgeon: Lucas Mallow, MD;  Location: [facility];  Service: Urology;  Laterality: N/A;  . CYSTOSCOPY WITH URETHRAL DILATATION N/A 05/21/2019   Procedure: CYSTOSCOPY WITH URETHRAL BALLOON DILATATION;  Surgeon: Lucas Mallow, MD;  Location: [facility];  Service: Urology;  Laterality: N/A;  . ESOPHAGOGASTRODUODENOSCOPY N/A 11/16/2019   Procedure:  ESOPHAGOGASTRODUODENOSCOPY (EGD);  Surgeon: Wilford Corner, MD;  Location: [facility];  Service: Endoscopy;  Laterality: N/A;  . ESOPHAGOGASTRODUODENOSCOPY (EGD) WITH PROPOFOL Left 09/08/2016   Procedure: ESOPHAGOGASTRODUODENOSCOPY (EGD) WITH PROPOFOL;  Surgeon: Ronnette Juniper, MD;  Location: [facility];  Service: Gastroenterology;  Laterality: Left;  . GIVENS CAPSULE STUDY N/A 11/16/2019   Procedure: GIVENS CAPSULE STUDY;  Surgeon: Wilford Corner, MD;  Location: [facility];  Service: Endoscopy;  Laterality: N/A;  . INGUINAL HERNIA REPAIR      "don't remeimber which side"  . LAPAROSCOPIC NEPHRECTOMY Right 03/31/2019   Procedure: RIGHT  HAND ASSIST LAPAROSCOPIC NEPHRECTOMY;  Surgeon: Lucas Mallow, MD;  Location: [facility];  Service: Urology;  Laterality: Right;  . PACEMAKER IMPLANT N/A 12/25/2016   Procedure: PACEMAKER IMPLANT;  Surgeon: Deboraha Sprang, MD;  Location: [facility];  Service: Cardiovascular;  Laterality: N/A;  . TONSILLECTOMY      Allergies  Allergen Reactions  . Simvastatin Other (See Comments)    Abdominal cramps  . Nitrostat [Nitroglycerin] Other (See Comments)    Causes blood pressure to "bottom out"    Prior to Admission medications   Medication Sig Start Date End Date Taking? Authorizing Provider  acetaminophen (TYLENOL) 500 MG tablet Take 500-1,000 mg by mouth every 6 (six) hours as needed for mild pain, moderate pain or headache.    Yes [provider]  allopurinol (ZYLOPRIM) 300 MG tablet Take 300 mg by mouth daily.   Yes [provider]  amLODipine (NORVASC) 5 MG tablet Take 2.5 mg by mouth daily.    Yes [provider]  atorvastatin (LIPITOR) 20 MG tablet TAKE 1 TABLET BY MOUTH DAILY Patient taking differently: Take 20 mg by mouth daily. 09/08/19  Yes Troy Sine, MD  b complex vitamins tablet Take 1 tablet by mouth daily.   Yes [provider]  docusate sodium (COLACE) 100 MG capsule Take 2 capsules (200 mg total) by mouth daily. 01/26/20  Yes Thurnell Lose, MD  ferrous sulfate 325 (65 FE) MG tablet Take 1 tablet (325 mg total) by mouth 2 (two) times daily with a meal. 01/26/20  Yes Thurnell Lose, MD  furosemide (LASIX) 40 MG tablet Take 0.5 tablets (20 mg total) by mouth daily. 11/17/19  Yes Vann, Jessica U, DO  metoprolol succinate (TOPROL-XL) 25 MG 24 hr tablet TAKE 1 TABLET BY MOUTH DAILY Patient taking differently: Take 25 mg by mouth daily. 01/19/20  Yes Troy Sine, MD  pantoprazole (PROTONIX) 40 MG tablet Take 1 tablet (40 mg total)  by mouth 2 (two) times daily. 01/26/20  Yes Thurnell Lose, MD  Rivaroxaban (XARELTO) 15 MG TABS tablet Take 1 tablet (15 mg total) by mouth daily after supper. 02/01/20  Yes Thurnell Lose, MD  sodium bicarbonate 650 MG tablet Take 1 tablet (650 mg total) by mouth 3 (three) times daily. 01/26/20  Yes Thurnell Lose, MD  Tamsulosin HCl (FLOMAX) 0.4 MG CAPS Take 1 capsule (0.4 mg total) by mouth daily. 07/20/11  Yes Chauncy Passy, MD    Social History   Socioeconomic History  . Marital status: Married    Spouse name: Not on file  . Number of children: Not on file  . Years of education: Not on file  . Highest education level: Not on file  Occupational History  . Not on file  Tobacco Use  . Smoking status: Former Smoker    Packs/day: 1.00    Years: 30.00  Pack years: 30.00    Types: Cigarettes  . Smokeless tobacco: Never Used  . Tobacco comment: quit ~ 1985  Vaping Use  . Vaping Use: Never used  Substance and Sexual Activity  . Alcohol use: Yes    Alcohol/week: 0.0 standard drinks    Comment: rare beer  . Drug use: No  . Sexual activity: Not on file  Other Topics Concern  . Not on file  Social History Narrative  . Not on file   Social Determinants of Health   Financial Resource Strain: Not on file  Food Insecurity: Not on file  Transportation Needs: Not on file  Physical Activity: Not on file  Stress: Not on file  Social Connections: Not on file  Intimate Partner Violence: Not on file     Family History  Problem Relation Age of Onset  . Heart disease Father     ROS: Otherwise negative unless mentioned in HPI  Physical Examination  Vitals:   02/04/20 0451 02/04/20 0830  BP: 137/69   Pulse: 62   Resp: 18   Temp: 98.2 F (36.8 C)   SpO2: 95% 97%   Body mass index is 28.54 kg/m.  General:  WDWN in NAD Gait: Not observed HENT: WNL, normocephalic Pulmonary: normal non-labored breathing Cardiac: regular rate and rhythm  Abdomen: soft, NT/ND, no  masses Vascular Exam/Pulses: 2+ bilateral radial and brachial pulses. 5/5 grip strength Musculoskeletal: no muscle wasting or atrophy  Neurologic: A&O X 3;  No focal weakness or paresthesias are detected; speech is fluent/normal Psychiatric:  The pt has Normal affect.   CBC    Component Value Date/Time   WBC 12.6 (H) 02/04/2020 0213   RBC 2.98 (L) 02/04/2020 0213   HGB 7.5 (L) 02/04/2020 0213   HGB 7.7 (L) 10/20/2019 0924   HCT 25.6 (L) 02/04/2020 0213   HCT 39.9 10/01/2018 1130   PLT 178 02/04/2020 0213   PLT 146 (L) 10/20/2019 0924   MCV 85.9 02/04/2020 0213   MCH 25.2 (L) 02/04/2020 0213   MCHC 29.3 (L) 02/04/2020 0213   RDW 20.2 (H) 02/04/2020 0213   LYMPHSABS 0.7 02/01/2020 1618   MONOABS 1.0 02/01/2020 1618   EOSABS 0.1 02/01/2020 1618   BASOSABS 0.0 02/01/2020 1618    BMET    Component Value Date/Time   NA 146 (H) 02/04/2020 0213   NA 146 (H) 11/22/2016 1057   K 3.8 02/04/2020 0213   CL 113 (H) 02/04/2020 0213   CO2 20 (L) 02/04/2020 0213   GLUCOSE 116 (H) 02/04/2020 0213   BUN 55 (H) 02/04/2020 0213   BUN 44 (H) 11/22/2016 1057   CREATININE 4.58 (H) 02/04/2020 0213   CREATININE 3.18 (HH) 10/20/2019 0924   CREATININE 1.79 (H) 10/27/2015 0803   CALCIUM 7.8 (L) 02/04/2020 0213   GFRNONAA 12 (L) 02/04/2020 0213   GFRNONAA 17 (L) 10/20/2019 0924   GFRNONAA 36 (L) 10/27/2015 0803   GFRAA 20 (L) 08/12/2019 0959   GFRAA 41 (L) 10/27/2015 0803    COAGS: Lab Results  Component Value Date   INR 2.6 (H) 11/14/2019   INR 1.8 (H) 03/21/2019   INR 1.18 09/09/2016     Non-Invasive Vascular Imaging:   12/10/19 +-----------------+-------------+----------+--------------+  Right Cephalic  Diameter (cm)Depth (cm)  Findings    +-----------------+-------------+----------+--------------+  Shoulder                 not visualized  +-----------------+-------------+----------+--------------+  Prox upper arm  not  visualized  +-----------------+-------------+----------+--------------+  Mid upper arm    0.21                 +-----------------+-------------+----------+--------------+  Dist upper arm    0.16                 +-----------------+-------------+----------+--------------+  Antecubital fossa  0.16                 +-----------------+-------------+----------+--------------+  Prox forearm     0.14                 +-----------------+-------------+----------+--------------+  Mid forearm               not visualized  +-----------------+-------------+----------+--------------+  Dist forearm               not visualized  +-----------------+-------------+----------+--------------+  Wrist                  not visualized  +-----------------+-------------+----------+--------------+   +-----------------+-------------+----------+--------------+  Right Basilic  Diameter (cm)Depth (cm)  Findings    +-----------------+-------------+----------+--------------+  Shoulder                 not visualized  +-----------------+-------------+----------+--------------+  Prox upper arm              not visualized  +-----------------+-------------+----------+--------------+  Mid upper arm    0.38                 +-----------------+-------------+----------+--------------+  Dist upper arm    0.28                 +-----------------+-------------+----------+--------------+  Antecubital fossa  0.21                 +-----------------+-------------+----------+--------------+  Prox forearm     0.19                 +-----------------+-------------+----------+--------------+   +-----------------+-------------+----------+--------+  Left Cephalic   Diameter (cm)Depth (cm)Findings  +-----------------+-------------+----------+--------+  Prox upper arm    0.22              +-----------------+-------------+----------+--------+  Mid upper arm    0.15              +-----------------+-------------+----------+--------+  Dist upper arm    0.26              +-----------------+-------------+----------+--------+  Antecubital fossa  0.26         joins   +-----------------+-------------+----------+--------+  Prox forearm     0.16              +-----------------+-------------+----------+--------+  Mid forearm     0.17              +-----------------+-------------+----------+--------+  Dist forearm     0.15              +-----------------+-------------+----------+--------+   +-----------------+-------------+----------+--------------+  Left Basilic   Diameter (cm)Depth (cm)  Findings    +-----------------+-------------+----------+--------------+  Shoulder                 not visualized  +-----------------+-------------+----------+--------------+  Prox upper arm              not visualized  +-----------------+-------------+----------+--------------+  Mid upper arm    0.15                 +-----------------+-------------+----------+--------------+  Dist upper arm    0.14                 +-----------------+-------------+----------+--------------+  Antecubital fossa  0.11                 +-----------------+-------------+----------+--------------+  Prox forearm     0.10                 +-----------------+-------------+----------+--------------+    Statin:  Yes.   Beta Blocker:  Yes.   Aspirin:  No. ACEI:  No. ARB:  No. CCB use:  Yes Other antiplatelets/anticoagulants:   No.    ASSESSMENT/PLAN: This is a 80 y.o. male with acute on CKD stage 5 now approaching ESRD. He has known solitary kidney secondary to nephrectomy from [place]. He has worsening uremic symptoms and labs have not improved with IV hydration so Nephrology feels that he needs to initiate dialysis this admission. He is scheduled to have temporary catheter placed. Previous vein mapping showed inadequate veins in bilateral upper extremities but they will re evaluate him in the OR before proceeding with graft. He also has a left sided pacemaker so he will best be suited for a right upper extremity AV graft vs fistula. He is not currently on anticoagulation for his atrial fibrillation. Dr. Trula Slade will see and evaluate the patient later this afternoon. Patient will be schedule for RUE AV fistula vs. Graft on Friday 02/06/20 by Dr. Marcell Anger PA-C [facility] [phone] 02/04/2020  12:44 PM   I agree with the above.  The patient is a candidate for either a right arm graft or a for staged right basilic vein fistula.  I had originally planned on doing this Friday however he was found to have staph aureus in his urine in addition to his Covid.  He was seen by infectious disease who has recommended TEE prior to any vascular procedure to rule out endocarditis.  We will continue to check on the patient and get him scheduled for surgery once he has been cleared by ID  Annamarie Major

## 2020-02-04 NOTE — Progress Notes (Signed)
Inpatient Diabetes Program Recommendations  AACE/ADA: New Consensus Statement on Inpatient Glycemic Control (2015)  Target Ranges:  Prepandial:   less than 140 mg/dL      Peak postprandial:   less than 180 mg/dL (1-2 hours)      Critically ill patients:  140 - 180 mg/dL   Lab Results  Component Value Date   GLUCAP 129 (H) 02/04/2020    Review of Glycemic Control Results for ISTVAN, BEHAR (MRN [id number]) as of 02/04/2020 09:57  Ref. Range 02/03/2020 20:46 02/03/2020 21:41 02/04/2020 07:27  Glucose-Capillary Latest Ref Range: 70 - 99 mg/dL 53 (L) 91 129 (H)   Diabetes history: no hx noted Outpatient Diabetes medications: none Current orders for Inpatient glycemic control: Novolog 0-15 units TID, Novolog 0-5 units QHS  Inpatient Diabetes Program Recommendations:    Noted hypoglycemia yesterday of 53 mg/dL following correction. In the setting of renal status, consider reducing correction to Novolog 0-6 units TID.   Thanks, Bronson Curb, MSN, RNC-OB Diabetes Coordinator [phone] (8a-5p)

## 2020-02-04 NOTE — Progress Notes (Signed)
PROGRESS NOTE    Paul Price  SWN:[id number] DOB: [birth year] DOA: 02/01/2020 PCP: [name], MD   Brief Narrative: 80 year old with past medical history significant for paroxysmal A. fib, chronic kidney disease, hypertension, hyperlipidemia, BPH who presented to the ED complaining of shortness of breath and nausea.  Patient was recently discharged on 01/26/2020 after admission for upper GI bleed, AKI and Covid pneumonia.  Patient finished a course of breath then severe and is steroids.  He did not have oxygen requirement at that time.  Patient was evaluated by GI at that time and advised to hold Xarelto until 01/31/2020.  Patient did not resume Xarelto at home.  Patient was discharged with a creatinine of 3.7.  He presents with nausea vomiting, unable to keep anything down.  Patient admitted with poor oral intake, dehydration, hyponatremia, nausea and AKI.    Assessment & Plan:   Principal Problem:   AKI (acute kidney injury) ([place]) Active Problems:   HTN (hypertension)   Hyperlipidemia with target LDL less than 70   OSA on CPAP   PAF (paroxysmal atrial fibrillation) (HCC)   COVID-19 virus infection   Intractable nausea and vomiting   Hypernatremia   Elevated troponin  1-AKI on CKD stage IV: Recent creatinine at discharge was 3.7.  Presented with a creatinine of 4.5 Continue to hold Lasix. Appreciate nephrology evaluation.  Continue to have nausea and poor oral intake. Ct still 4.5. Nephrology planning trial of HD>   2-Hyponatremia: Related to hypovolemia poor oral intake. Resolved With IV fluids. Hypernatremia;  On IV fluids.   3-Hypokalemia: Replaced.   4-Staph Aureus in urine.  UTI: urine culture growing Staph aureus.  Plan to transition to cefazolin.  Cardio consulted  for TEE Appreciate ID recommendations.  Check  Blood culture.   HFrEF;  Echo 2021 ejection fraction 40 to 45% Monitor volume status Continue to hold IV Lasix  Nausea and vomiting; He denies  abdominal pain. KUB negative for bowel obstruction Supportive care  Elevated troponin: Most likely demand ischemia and worsening renal function. Denies chest pain  Generalized weakness: PT eval  Hypertension: Continue with Norvasc and Metoprolol  Covid positive, tested 01/20/2020 Completed a course of rhythm severe and IV steroid Per Dr Baxter Flattery, ok to discontinue isolation.   A. Fib: Per GI he was supposed to be with Xarelto 1/22. Hemoglobin is slowly trending down, continue to hold Xarelto for now. For HD access and fistula.     Estimated body mass index is 28.54 kg/m as calculated from the following:   Height as of this encounter: 5\' 6"  (1.676 m).   Weight as of this encounter: 80.2 kg.   DVT prophylaxis: Scd Code Status: Full code Family Communication: care discussed with Son over phone.  Disposition Plan:  Status is: Inpatient  Remains inpatient appropriate because:Persistent severe electrolyte disturbances   Dispo: The patient is from: Home              Anticipated d/c is to: Home              Anticipated d/c date is: 3 days              Patient currently is not medically stable to d/c.   Difficult to place patient No        Consultants:   Nephrology,.   Procedures:  none Antimicrobials:  Ceftriaxone   Subjective: No vomiting today,   Objective: Vitals:   02/03/20 2011 02/04/20 0451 02/04/20 0830 02/04/20 1527  BP: Marland Kitchen)  142/64 137/69  136/62  Pulse: 67 62  62  Resp: 15 18  18   Temp: 98.5 F (36.9 C) 98.2 F (36.8 C)  98.5 F (36.9 C)  TempSrc: Oral Oral  Oral  SpO2: 95% 95% 97% 94%  Weight:      Height:        Intake/Output Summary (Last 24 hours) at 02/04/2020 1616 Last data filed at 02/04/2020 0645 Gross per 24 hour  Intake 1980 ml  Output 210 ml  Net 1770 ml   Filed Weights   02/02/20 1952  Weight: 80.2 kg    Examination:  General exam: NAD  Respiratory system: CTA Cardiovascular system: S 1, S 2 RRR Gastrointestinal  system: BS present, soft, nt Central nervous system: Alert and oriented  Extremities: no edema    Data Reviewed: I have personally reviewed following labs and imaging studies  CBC: Recent Labs  Lab 02/01/20 1618 02/02/20 0055 02/03/20 0239 02/04/20 0213  WBC 16.8* 17.3* 16.2* 12.6*  NEUTROABS 14.5*  --   --   --   HGB 9.2* 8.5* 7.9* 7.5*  HCT 31.2* 29.7* 26.1* 25.6*  MCV 84.1 85.6 82.1 85.9  PLT 249 226 195 572   Basic Metabolic Panel: Recent Labs  Lab 02/01/20 1618 02/02/20 0055 02/03/20 0239 02/03/20 1450 02/04/20 0213  NA 152* 150* 145 147* 146*  K 3.8 3.0* 2.9* 3.3* 3.8  CL 117* 117* 112* 113* 113*  CO2 24 23 23 22  20*  GLUCOSE 145* 155* 157* 143* 116*  BUN 66* 65* 58* 58* 55*  CREATININE 4.55* 4.55* 4.47* 4.56* 4.58*  CALCIUM 8.4* 8.0* 7.8* 7.8* 7.8*  MG  --  2.1  --   --   --   PHOS  --   --  2.5 2.6 3.0   GFR: Estimated Creatinine Clearance: 12.6 mL/min (A) (by C-G formula based on SCr of 4.58 mg/dL (H)). Liver Function Tests: Recent Labs  Lab 02/01/20 1618 02/02/20 0055 02/03/20 0239 02/03/20 1450 02/04/20 0213  AST 28 28  --   --   --   ALT 24 22  --   --   --   ALKPHOS 126 109  --   --   --   BILITOT 0.8 0.9  --   --   --   PROT 5.9* 5.3*  --   --   --   ALBUMIN 2.4* 2.2* 1.9* 2.0* 1.8*   No results for input(s): LIPASE, AMYLASE in the last 168 hours. No results for input(s): AMMONIA in the last 168 hours. Coagulation Profile: No results for input(s): INR, PROTIME in the last 168 hours. Cardiac Enzymes: No results for input(s): CKTOTAL, CKMB, CKMBINDEX, TROPONINI in the last 168 hours. BNP (last 3 results) No results for input(s): PROBNP in the last 8760 hours. HbA1C: Recent Labs    02/04/20 0213  HGBA1C 6.1*   CBG: Recent Labs  Lab 02/03/20 1710 02/03/20 2046 02/03/20 2141 02/04/20 0727 02/04/20 1157  GLUCAP 279* 53* 91 129* 146*   Lipid Profile: No results for input(s): CHOL, HDL, LDLCALC, TRIG, CHOLHDL, LDLDIRECT in the  last 72 hours. Thyroid Function Tests: No results for input(s): TSH, T4TOTAL, FREET4, T3FREE, THYROIDAB in the last 72 hours. Anemia Panel: No results for input(s): VITAMINB12, FOLATE, FERRITIN, TIBC, IRON, RETICCTPCT in the last 72 hours. Sepsis Labs: Recent Labs  Lab 02/02/20 0055 02/03/20 0239  PROCALCITON 0.36 0.45    Recent Results (from the past 240 hour(s))  Urine culture  Status: Abnormal (Preliminary result)   Collection Time: 02/01/20  5:13 PM   Specimen: Urine, Random  Result Value Ref Range Status   Specimen Description URINE, RANDOM  Final   Special Requests NONE  Final   Culture (A)  Final    >=100,000 COLONIES/mL STAPHYLOCOCCUS AUREUS SUSCEPTIBILITIES TO FOLLOW Performed at [facility], [address] [place], [place] [address]    Report Status PENDING  Incomplete         Radiology Studies: DG Abd 1 View  Result Date: 02/03/2020 CLINICAL DATA:  Vomiting EXAM: ABDOMEN - 1 VIEW COMPARISON:  02/26/2019 FINDINGS: The bowel gas pattern is normal. Multiple calcified gallstones. Degenerative changes of the bilateral hips. IMPRESSION: 1. Nonobstructive bowel gas pattern. 2. Cholelithiasis. Electronically Signed   By: Davina Poke D.O.   On: 02/03/2020 12:33        Scheduled Meds: . amLODipine  2.5 mg Oral Daily  . atorvastatin  20 mg Oral Daily  . [START ON 02/05/2020] Chlorhexidine Gluconate Cloth  6 each Topical Q0600  . ferrous sulfate  325 mg Oral BID WC  . insulin aspart  0-6 Units Subcutaneous TID WC  . metoprolol succinate  25 mg Oral Daily  . pantoprazole  40 mg Oral BID  . sodium bicarbonate  650 mg Oral TID  . tamsulosin  0.4 mg Oral Daily   Continuous Infusions: . cefTRIAXone (ROCEPHIN)  IV 2 g (02/04/20 0841)  . dextrose 5 % and 0.45% NaCl 125 mL/hr at 02/04/20 1337     LOS: 2 days    Time spent: 35 minutes    Kerly Rigsbee A Monasia Lair, MD [facility]   If 7PM-7AM, please contact  night-coverage [email or web address]  02/04/2020, 4:16 PM

## 2020-02-04 NOTE — Progress Notes (Signed)
Physical Therapy Treatment Patient Details Name: Paul Price MRN: [id number] DOB: 23-Mar-1940 Today's Date: 02/04/2020    History of Present Illness Pt is an 80 y/o male admitted secondary to worsening covid symptoms. Pt with recent admission secondary to GI bleed. PMH includes a fib, DVT, HTN, gout, and OSA on CPAP.    PT Comments    Pt received in bed, agreeable to participation in therapy. He required min guard assist supine to sit, min guard assist sit to stand, and min guard assist ambulation 50' without AD. Gait distance limited by nausea. SpO2 maintained > 90% on RA. Pt performed LE exercises seated in recliner. Pt remained in recliner with feet elevated at end of session.    Follow Up Recommendations  Home health PT;Supervision/Assistance - 24 hour     Equipment Recommendations  None recommended by PT    Recommendations for Other Services       Precautions / Restrictions Precautions Precautions: Fall    Mobility  Bed Mobility Overal bed mobility: Needs Assistance Bed Mobility: Supine to Sit     Supine to sit: Min guard;HOB elevated     General bed mobility comments: +rail, increased time, min guard for safety  Transfers Overall transfer level: Needs assistance Equipment used: None Transfers: Sit to/from Stand Sit to Stand: Min guard         General transfer comment: Min guard for safety, increased time  Ambulation/Gait Ambulation/Gait assistance: Min guard Gait Distance (Feet): 50 Feet Assistive device: None Gait Pattern/deviations: Step-through pattern;Decreased stride length Gait velocity: decreased Gait velocity interpretation: <1.31 ft/sec, indicative of household ambulator General Gait Details: Maintained SpO2 > 90% on RA. Min guard for safety. Distance limited by nausea.   Stairs             Wheelchair Mobility    Modified Rankin (Stroke Patients Only)       Balance Overall balance assessment: Needs assistance Sitting-balance  support: No upper extremity supported;Feet supported Sitting balance-[name] Scale: Good     Standing balance support: No upper extremity supported;During functional activity Standing balance-[name] Scale: Fair                              Cognition Arousal/Alertness: Awake/alert Behavior During Therapy: WFL for tasks assessed/performed Overall Cognitive Status: No family/caregiver present to determine baseline cognitive functioning                                 General Comments: WFL for basic tasks. Mild confusion. Decreased safety awareness.      Exercises General Exercises - Lower Extremity Ankle Circles/Pumps: AROM;Both;10 reps;Seated Long Arc Quad: AROM;Right;Left;10 reps;Seated Hip ABduction/ADduction: AROM;Both;10 reps;Seated Hip Flexion/Marching: AROM;Right;Left;10 reps;Seated    General Comments        Pertinent Vitals/Pain Pain Assessment: No/denies pain    Home Living                      Prior Function            PT Goals (current goals can now be found in the care plan section) Acute Rehab PT Goals Patient Stated Goal: home Progress towards PT goals: Progressing toward goals    Frequency    Min 3X/week      PT Plan Current plan remains appropriate    Co-evaluation  AM-PAC PT "6 Clicks" Mobility   Outcome Measure  Help needed turning from your back to your side while in a flat bed without using bedrails?: None Help needed moving from lying on your back to sitting on the side of a flat bed without using bedrails?: A Little Help needed moving to and from a bed to a chair (including a wheelchair)?: A Little Help needed standing up from a chair using your arms (e.g., wheelchair or bedside chair)?: A Little Help needed to walk in hospital room?: A Little Help needed climbing 3-5 steps with a railing? : A Lot 6 Click Score: 18    End of Session Equipment Utilized During Treatment: Gait  belt Activity Tolerance: Patient tolerated treatment well Patient left: in chair;with call bell/phone within reach;with chair alarm set Nurse Communication: Mobility status PT Visit Diagnosis: Unsteadiness on feet (R26.81);Muscle weakness (generalized) (M62.81)     Time: 1655-3748 PT Time Calculation (min) (ACUTE ONLY): 23 min  Charges:  $Gait Training: 8-22 mins $Therapeutic Exercise: 8-22 mins                     Lorrin Goodell, PT  Office # [phone] Pager [phone]    Lorriane Shire 02/04/2020, 11:51 AM

## 2020-02-04 NOTE — Plan of Care (Signed)
  Problem: Education: Goal: Knowledge of General Education information will improve Description: Including pain rating scale, medication(s)/side effects and non-pharmacologic comfort measures Outcome: Progressing   Problem: Clinical Measurements: Goal: Will remain free from infection Outcome: Progressing   Problem: Activity: Goal: Risk for activity intolerance will decrease Outcome: Progressing   Problem: Nutrition: Goal: Adequate nutrition will be maintained Outcome: Progressing   Problem: Elimination: Goal: Will not experience complications related to bowel motility Outcome: Progressing Goal: Will not experience complications related to urinary retention Outcome: Progressing   Problem: Pain Managment: Goal: General experience of comfort will improve Outcome: Progressing   Problem: Safety: Goal: Ability to remain free from injury will improve Outcome: Progressing   Problem: Skin Integrity: Goal: Risk for impaired skin integrity will decrease Outcome: Progressing

## 2020-02-04 NOTE — Progress Notes (Signed)
[name] Progress Note   Subjective:    Tolerating some liquid intake but still poor appetite.  No new complaints.  RN unable to place foley last PM, condom catheter applied.  It fell off and patient is incontinent. son Paul Price [phone] updated on the phone today at 1220pm  Objective Vitals:   02/03/20 1620 02/03/20 2011 02/04/20 0451 02/04/20 0830  BP: 123/68 (!) 142/64 137/69   Pulse: 65 67 62   Resp: 18 15 18    Temp: 98.7 F (37.1 C) 98.5 F (36.9 C) 98.2 F (36.8 C)   TempSrc: Axillary Oral Oral   SpO2: 95% 95% 95% 97%  Weight:      Height:       Physical Exam Gen: elderly man at 45 deg in bed - alert Eyes: anicteric, EOMI ENT: MM tacky Neck: supple, no JVD CV:  RRR, paced on monitor, no rub Abd:  Soft, nontender Back: clear lungs with normal WOB GU: condom cath fell off Extr:  No edema Neuro: some tremors noted but no asterixis; hard of hearing  Additional Objective Labs: Basic Metabolic Panel: Recent Labs  Lab 02/03/20 0239 02/03/20 1450 02/04/20 0213  NA 145 147* 146*  K 2.9* 3.3* 3.8  CL 112* 113* 113*  CO2 23 22 20*  GLUCOSE 157* 143* 116*  BUN 58* 58* 55*  CREATININE 4.47* 4.56* 4.58*  CALCIUM 7.8* 7.8* 7.8*  PHOS 2.5 2.6 3.0   Liver Function Tests: Recent Labs  Lab 02/01/20 1618 02/02/20 0055 02/03/20 0239 02/03/20 1450 02/04/20 0213  AST 28 28  --   --   --   ALT 24 22  --   --   --   ALKPHOS 126 109  --   --   --   BILITOT 0.8 0.9  --   --   --   PROT 5.9* 5.3*  --   --   --   ALBUMIN 2.4* 2.2* 1.9* 2.0* 1.8*   No results for input(s): LIPASE, AMYLASE in the last 168 hours. CBC: Recent Labs  Lab 02/01/20 1618 02/02/20 0055 02/03/20 0239 02/04/20 0213  WBC 16.8* 17.3* 16.2* 12.6*  NEUTROABS 14.5*  --   --   --   HGB 9.2* 8.5* 7.9* 7.5*  HCT 31.2* 29.7* 26.1* 25.6*  MCV 84.1 85.6 82.1 85.9  PLT 249 226 195 178   Blood Culture    Component Value Date/Time   SDES URINE, RANDOM 02/01/2020 1713    SPECREQUEST NONE 02/01/2020 1713   CULT (A) 02/01/2020 1713    >=100,000 COLONIES/mL STAPHYLOCOCCUS AUREUS SUSCEPTIBILITIES TO FOLLOW Performed at [facility] [address] [place], [place] [address]    REPTSTATUS PENDING 02/01/2020 1713    Cardiac Enzymes: No results for input(s): CKTOTAL, CKMB, CKMBINDEX, TROPONINI in the last 168 hours. CBG: Recent Labs  Lab 02/03/20 1710 02/03/20 2046 02/03/20 2141 02/04/20 0727 02/04/20 1157  GLUCAP 279* 53* 91 129* 146*   Iron Studies: No results for input(s): IRON, TIBC, TRANSFERRIN, FERRITIN in the last 72 hours. @lablastinr3 @ Studies/Results: DG Abd 1 View  Result Date: 02/03/2020 CLINICAL DATA:  Vomiting EXAM: ABDOMEN - 1 VIEW COMPARISON:  02/26/2019 FINDINGS: The bowel gas pattern is normal. Multiple calcified gallstones. Degenerative changes of the bilateral hips. IMPRESSION: 1. Nonobstructive bowel gas pattern. 2. Cholelithiasis. Electronically Signed   By: Davina Poke D.O.   On: 02/03/2020 12:33   Medications: . cefTRIAXone (ROCEPHIN)  IV 2 g (02/04/20 0841)  . dextrose 5 % and  0.45% NaCl 125 mL/hr at 02/04/20 0131   . amLODipine  2.5 mg Oral Daily  . atorvastatin  20 mg Oral Daily  . [START ON 02/05/2020] Chlorhexidine Gluconate Cloth  6 each Topical Q0600  . ferrous sulfate  325 mg Oral BID WC  . insulin aspart  0-6 Units Subcutaneous TID WC  . metoprolol succinate  25 mg Oral Daily  . pantoprazole  40 mg Oral BID  . sodium bicarbonate  650 mg Oral TID  . tamsulosin  0.4 mg Oral Daily   Assessment/Plan **AKI on CKD 5:  Initially thought to be prerenal but hasn't improved with volume resuscitation.  eGFR 12 is likely overestimation with advanced age and low muscle mass; 24h urine has been logistically impossible.  Perhaps the underlying issue is uremia leading to nausea and we have decided to proceed with trial of dialysis.  Has seen Dr. Scot Dock in the past with plans for AVG, will d/w VVS re: access.    Discussed with pt and son via phone.  Paul Price [phone].  **Nausea/emesis/anorexia:  Abd X ray today unrevealing.  Persistent.  Trial of dialysis as this may be a uremic symptom.  **Anemia:  On po iron, trend H/H; no indication for transfusion currently but into 7s now.  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  **Hypernatremia:  resolved with free water administration  **Hypokalemia:  Further repleted, normal now. 4K dialysate.   **recent COVID:  S/p steroids and remdesivir last admission; CXR improved, ok on RA.   Precautions per [facility] policy.   **A fib:  Paced currently, xarelto on hold for now -- would consider coumadin or eliquis if resumed this admission given eGFR < 15.   Jannifer Hick MD 02/04/2020, 12:25 PM  [facility] Pager: [phone]

## 2020-02-05 ENCOUNTER — Inpatient Hospital Stay (HOSPITAL_COMMUNITY): Payer: Medicare Other

## 2020-02-05 DIAGNOSIS — U071 COVID-19: Secondary | ICD-10-CM | POA: Diagnosis not present

## 2020-02-05 DIAGNOSIS — N179 Acute kidney failure, unspecified: Secondary | ICD-10-CM | POA: Diagnosis not present

## 2020-02-05 DIAGNOSIS — I10 Essential (primary) hypertension: Secondary | ICD-10-CM | POA: Diagnosis not present

## 2020-02-05 HISTORY — PX: IR FLUORO GUIDE CV LINE RIGHT: IMG2283

## 2020-02-05 HISTORY — PX: IR US GUIDE VASC ACCESS RIGHT: IMG2390

## 2020-02-05 LAB — RENAL FUNCTION PANEL
Albumin: 1.8 g/dL — ABNORMAL LOW (ref 3.5–5.0)
Anion gap: 9 (ref 5–15)
BUN: 52 mg/dL — ABNORMAL HIGH (ref 8–23)
CO2: 22 mmol/L (ref 22–32)
Calcium: 7.9 mg/dL — ABNORMAL LOW (ref 8.9–10.3)
Chloride: 114 mmol/L — ABNORMAL HIGH (ref 98–111)
Creatinine, Ser: 4.44 mg/dL — ABNORMAL HIGH (ref 0.61–1.24)
GFR, Estimated: 13 mL/min — ABNORMAL LOW (ref >60)
Glucose, Bld: 102 mg/dL — ABNORMAL HIGH (ref 70–99)
Phosphorus: 3.2 mg/dL (ref 2.5–4.6)
Potassium: 3.4 mmol/L — ABNORMAL LOW (ref 3.5–5.1)
Sodium: 145 mmol/L (ref 135–145)

## 2020-02-05 LAB — GLUCOSE, CAPILLARY
Glucose-Capillary: 122 mg/dL — ABNORMAL HIGH (ref 70–99)
Glucose-Capillary: 151 mg/dL — ABNORMAL HIGH (ref 70–99)
Glucose-Capillary: 101 mg/dL — ABNORMAL HIGH (ref 70–99)
Glucose-Capillary: 126 mg/dL — ABNORMAL HIGH (ref 70–99)

## 2020-02-05 LAB — CBC
HCT: 27.2 % — ABNORMAL LOW (ref 39.0–52.0)
Hemoglobin: 8 g/dL — ABNORMAL LOW (ref 13.0–17.0)
MCH: 24.9 pg — ABNORMAL LOW (ref 26.0–34.0)
MCHC: 29.4 g/dL — ABNORMAL LOW (ref 30.0–36.0)
MCV: 84.7 fL (ref 80.0–100.0)
Platelets: 169 10*3/uL (ref 150–400)
RBC: 3.21 MIL/uL — ABNORMAL LOW (ref 4.22–5.81)
RDW: 19.9 % — ABNORMAL HIGH (ref 11.5–15.5)
WBC: 10.7 10*3/uL — ABNORMAL HIGH (ref 4.0–10.5)
nRBC: 0 % (ref 0.0–0.2)

## 2020-02-05 LAB — URINE CULTURE: Culture: >=100000 — AB

## 2020-02-05 LAB — HEPATITIS B SURFACE ANTIBODY,QUALITATIVE: Hep B S Ab: NONREACTIVE

## 2020-02-05 LAB — HEPATITIS B SURFACE ANTIGEN: Hepatitis B Surface Ag: NONREACTIVE

## 2020-02-05 LAB — HEPATITIS B CORE ANTIBODY, TOTAL: Hep B Core Total Ab: NONREACTIVE

## 2020-02-05 MED ORDER — HEPARIN SODIUM (PORCINE) 1000 UNIT/ML IJ SOLN
INTRAMUSCULAR | Status: AC
  Filled 2020-02-05: qty 1

## 2020-02-05 MED ORDER — SODIUM CHLORIDE 0.9 % IV SOLN: INTRAVENOUS | Status: DC

## 2020-02-05 MED ORDER — POTASSIUM CHLORIDE CRYS ER 20 MEQ PO TBCR
40.0000 meq | EXTENDED_RELEASE_TABLET | Freq: Once | ORAL | Status: AC
  Administered 2020-02-05: 40 meq via ORAL
  Filled 2020-02-05: qty 2

## 2020-02-05 MED ORDER — GELATIN ABSORBABLE 12-7 MM EX MISC
CUTANEOUS | Status: AC
  Filled 2020-02-05: qty 1

## 2020-02-05 MED ORDER — HEPARIN SODIUM (PORCINE) 1000 UNIT/ML IJ SOLN
INTRAMUSCULAR | Status: AC | PRN
  Administered 2020-02-05: 1000 [IU]

## 2020-02-05 MED ORDER — LIDOCAINE HCL (PF) 1 % IJ SOLN: 5.0000 mL | INTRAMUSCULAR | Status: DC | PRN

## 2020-02-05 MED ORDER — SODIUM CHLORIDE 0.9 % IV SOLN: 100.0000 mL | INTRAVENOUS | Status: DC | PRN

## 2020-02-05 MED ORDER — LIDOCAINE HCL 1 % IJ SOLN
INTRAMUSCULAR | Status: AC
  Filled 2020-02-05: qty 20

## 2020-02-05 MED ORDER — CEFAZOLIN SODIUM-DEXTROSE 2-4 GM/100ML-% IV SOLN
2.0000 g | Freq: Two times a day (BID) | INTRAVENOUS | Status: AC
  Administered 2020-02-05 – 2020-02-07 (×6): 2 g via INTRAVENOUS
  Filled 2020-02-05 (×6): qty 100

## 2020-02-05 MED ORDER — LIDOCAINE HCL 1 % IJ SOLN
INTRAMUSCULAR | Status: AC | PRN
  Administered 2020-02-05: 20 mL via INTRADERMAL

## 2020-02-05 MED ORDER — PENTAFLUOROPROP-TETRAFLUOROETH EX AERO: 1.0000 "application " | INHALATION_SPRAY | CUTANEOUS | Status: DC | PRN

## 2020-02-05 MED ORDER — ALTEPLASE 2 MG IJ SOLR: 2.0000 mg | Freq: Once | INTRAMUSCULAR | Status: DC | PRN

## 2020-02-05 MED ORDER — HEPARIN SODIUM (PORCINE) 1000 UNIT/ML DIALYSIS
1000.0000 [IU] | INTRAMUSCULAR | Status: DC | PRN
  Administered 2020-02-09: 3000 [IU] via INTRAVENOUS_CENTRAL
  Filled 2020-02-05 (×2): qty 1

## 2020-02-05 MED ORDER — LIDOCAINE-PRILOCAINE 2.5-2.5 % EX CREA
1.0000 "application " | TOPICAL_CREAM | CUTANEOUS | Status: DC | PRN
  Filled 2020-02-05: qty 5

## 2020-02-05 NOTE — Consult Note (Signed)
   [facility] CM Inpatient Consult   02/05/2020  Paul Price 06-20-1940 [id number]   Patient chart has been reviewed for readmissions less than 30 days and for high risk score for unplanned readmissions.  Patient assessed for community [place] [facility] CM) follow up needs.   Plan:  Continue to follow progress and disposition to assess for post hospital care management needs.  Of note, [facility] Care Management services does not replace or interfere with any services that are arranged by inpatient case management or social work.  Netta Cedars, MSN, [facility] Liaison Nurse Mobile Phone [phone]  Toll free office [phone]

## 2020-02-05 NOTE — Progress Notes (Signed)
[facility] for Infectious Disease    Date of Admission:  02/01/2020   Total days of antibiotics 4           ID: Paul Price is a 80 y.o. male with  Principal Problem:   AKI (acute kidney injury) ([place]) Active Problems:   HTN (hypertension)   Hyperlipidemia with target LDL less than 70   OSA on CPAP   PAF (paroxysmal atrial fibrillation) (HCC)   COVID-19 virus infection   Intractable nausea and vomiting   Hypernatremia   Elevated troponin    Subjective: Afebrile, has numerous questions regarding TEE procedure and vascular procedure 12 point ros is negative  Medications:  . amLODipine  2.5 mg Oral Daily  . atorvastatin  20 mg Oral Daily  . Chlorhexidine Gluconate Cloth  6 each Topical Q0600  . ferrous sulfate  325 mg Oral BID WC  . gelatin adsorbable      . heparin sodium (porcine)      . insulin aspart  0-6 Units Subcutaneous TID WC  . lidocaine      . metoprolol succinate  25 mg Oral Daily  . pantoprazole  40 mg Oral BID  . sodium bicarbonate  650 mg Oral TID  . tamsulosin  0.4 mg Oral Daily    Objective: Vital signs in last 24 hours: Temp:  [98.2 F (36.8 C)-98.6 F (37 C)] 98.6 F (37 C) (01/27 0640) Pulse Rate:  [60-72] 72 (01/27 0640) Resp:  [17-20] 18 (01/27 1000) BP: (136-152)/(58-64) 136/58 (01/27 0640) SpO2:  [94 %-98 %] 95 % (01/27 1000) Physical Exam  Constitutional: He is oriented to person, place, and time. He appears well-developed and well-nourished. No distress.  HENT:  Mouth/Throat: Oropharynx is clear and moist. No oropharyngeal exudate.  Cardiovascular: Normal rate, regular rhythm and normal heart sounds. Exam reveals no gallop and no friction rub.  No murmur heard.  Pulmonary/Chest: Effort normal and breath sounds normal. No respiratory distress. He has no wheezes.  Abdominal: Soft. Bowel sounds are normal. He exhibits no distension. There is no tenderness.  Lymphadenopathy:  He has no cervical adenopathy.  Neurological: He is  alert and oriented to person, place, and time.  Skin: Skin is warm and dry. No rash noted. No erythema.  Psychiatric: He has a normal mood and affect. His behavior is normal.     Lab Results Recent Labs    02/04/20 0213 02/05/20 0239 02/05/20 0834  WBC 12.6*  --  10.7*  HGB 7.5*  --  8.0*  HCT 25.6*  --  27.2*  NA 146* 145  --   K 3.8 3.4*  --   CL 113* 114*  --   CO2 20* 22  --   BUN 55* 52*  --   CREATININE 4.58* 4.44*  --    Liver Panel Recent Labs    02/04/20 0213 02/05/20 0239  ALBUMIN 1.8* 1.8*   Sedimentation Rate No results for input(s): ESRSEDRATE in the last 72 hours. C-Reactive Protein No results for input(s): CRP in the last 72 hours.  Microbiology: Reviewed. Blood cx 1/26 pending Studies/Results: IR Fluoro Guide CV Line Right  Result Date: 02/05/2020 INDICATION: 80 year old male referred for temporary hemodialysis catheter EXAM: IMAGE GUIDED TEMPORARY HEMODIALYSIS CATHETER MEDICATIONS: None. ANESTHESIA/SEDATION: None FLUOROSCOPY TIME:  Fluoroscopy Time: 0 minutes 6 seconds (1 mGy). COMPLICATIONS: None PROCEDURE: Informed written consent was obtained from the patient's family after a discussion of the risks, benefits, and alternatives to treatment. Questions regarding the procedure  were encouraged and answered. The right neck was prepped with chlorhexidine in a sterile fashion, and a sterile drape was applied covering the operative field. Maximum barrier sterile technique with sterile gowns and gloves were used for the procedure. A timeout was performed prior to the initiation of the procedure. A micropuncture kit was utilized to access the right internal jugular vein under direct, real-time ultrasound guidance after the overlying soft tissues were anesthetized with 1% lidocaine with epinephrine. Ultrasound image documentation was performed. The microwire was kinked to measure appropriate catheter length. A stiff glidewire was advanced to the level of the IVC. A 19  cm hemodialysis catheter was then placed over the wire. Final catheter positioning was confirmed and documented with a spot radiographic image. The catheter aspirates and flushes normally. The catheter was flushed with appropriate volume heparin dwells. Dressings were applied. The patient tolerated the procedure well without immediate post procedural complication. . IMPRESSION: Status post image guided right IJ temporary hemodialysis catheter. Catheter ready for use. Signed, Dulcy Fanny. Dellia Nims, RPVI Vascular and Interventional Radiology Specialists [facility] Radiology Electronically Signed   By: Corrie Mckusick D.O.   On: 02/05/2020 12:43   IR US Guide Vasc Access Right  Result Date: 02/05/2020 INDICATION: 79 year old male referred for temporary hemodialysis catheter EXAM: IMAGE GUIDED TEMPORARY HEMODIALYSIS CATHETER MEDICATIONS: None. ANESTHESIA/SEDATION: None FLUOROSCOPY TIME:  Fluoroscopy Time: 0 minutes 6 seconds (1 mGy). COMPLICATIONS: None PROCEDURE: Informed written consent was obtained from the patient's family after a discussion of the risks, benefits, and alternatives to treatment. Questions regarding the procedure were encouraged and answered. The right neck was prepped with chlorhexidine in a sterile fashion, and a sterile drape was applied covering the operative field. Maximum barrier sterile technique with sterile gowns and gloves were used for the procedure. A timeout was performed prior to the initiation of the procedure. A micropuncture kit was utilized to access the right internal jugular vein under direct, real-time ultrasound guidance after the overlying soft tissues were anesthetized with 1% lidocaine with epinephrine. Ultrasound image documentation was performed. The microwire was kinked to measure appropriate catheter length. A stiff glidewire was advanced to the level of the IVC. A 19 cm hemodialysis catheter was then placed over the wire. Final catheter positioning was confirmed and  documented with a spot radiographic image. The catheter aspirates and flushes normally. The catheter was flushed with appropriate volume heparin dwells. Dressings were applied. The patient tolerated the procedure well without immediate post procedural complication. . IMPRESSION: Status post image guided right IJ temporary hemodialysis catheter. Catheter ready for use. Signed, Dulcy Fanny. Dellia Nims, RPVI Vascular and Interventional Radiology Specialists [facility] Radiology Electronically Signed   By: Corrie Mckusick D.O.   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 Patient underwent HD catheter palcement today. Thus far blood culture no growth to date at 24hr. Await for results of TEE to give abtx length of treatment recommendations.  Acute on ckd = patient has right temp IJ HD catheter, nephrology and vascular to decide further management   [facility] for Infectious Diseases Cell: [phone] Pager: [phone]  02/05/2020, 1:58 PM

## 2020-02-05 NOTE — Progress Notes (Addendum)
Patient Status: [facility] - In-pt  Assessment and Plan: Patient in need of temporary venous access for dialysis initiation.  Patient assessed at bedside this AM alongside his wife.  He recently transferred from [place] unit and is doing well, asymptomatic.  Discussed plans for dialysis initiation with Nephrology who indicate that OR may be on hold and VVS has requested IR placement of a temporary dialysis catheter.  Discussed this with patient and wife who are agreeable.   Risks and benefits discussed with the patient including, but not limited to bleeding, infection, vascular injury, pneumothorax which may require chest tube placement, air embolism or even death  All of the patient's questions were answered, patient is agreeable to proceed. Consent signed and in chart.  ______________________________________________________________________   History of Present Illness: Paul Price is a 80 y.o. male with past medical history of right renal cell carcinoma s/p nephrectomy, now solitary left kidney with acute on chronic renal failure. Patient in need is dialysis initiation and had plans to undergo tunneled dialysis catheter placement and fistula creation with VVS 02/05/20.  This has reportedly been postponed and Nephrology would like to move forward with dialysis now.  IR consulted for temporary dialysis catheter placement.   Allergies and medications reviewed.   Review of Systems: A 12 point ROS discussed and pertinent positives are indicated in the HPI above.  All other systems are negative.  Review of Systems  Constitutional: Negative for fatigue and fever.  Respiratory: Negative for cough and shortness of breath.   Cardiovascular: Negative for chest pain.  Gastrointestinal: Negative for abdominal pain, diarrhea, nausea and vomiting.  Musculoskeletal: Negative for back pain.  Psychiatric/Behavioral: Negative for behavioral problems and confusion.    Vital Signs: BP (!) 136/58 (BP  Location: Left Arm)   Pulse 72   Temp 98.6 F (37 C) (Oral)   Resp 20   Ht 5\' 6"  (1.676 m)   Wt 176 lb 12.9 oz (80.2 kg)   SpO2 98%   BMI 28.54 kg/m   Physical Exam Vitals and nursing note reviewed.  Constitutional:      General: He is not in acute distress.    Appearance: He is well-developed. He is not ill-appearing.  Cardiovascular:     Rate and Rhythm: Normal rate.  Pulmonary:     Effort: Pulmonary effort is normal. No tachypnea.  Musculoskeletal:     Cervical back: Normal range of motion and neck supple.  Skin:    General: Skin is warm and dry.  Neurological:     General: No focal deficit present.     Mental Status: He is alert and oriented to person, place, and time.  Psychiatric:        Mood and Affect: Mood normal.        Behavior: Behavior normal.      Imaging reviewed.   Labs:  COAGS: Recent Labs    03/21/19 1241 11/14/19 1306  INR 1.8* 2.6*    BMP: Recent Labs    04/03/19 0507 04/04/19 0529 05/14/19 1520 08/12/19 0959 10/20/19 0924 02/03/20 0239 02/03/20 1450 02/04/20 0213 02/05/20 0239  NA 135 134* 138 139   < > 145 147* 146* 145  K 4.1 3.9 3.8 4.2   < > 2.9* 3.3* 3.8 3.4*  CL 107 106 106 112*   < > 112* 113* 113* 114*  CO2 21* 18* 21* 20*   < > 23 22 20* 22  GLUCOSE 112* 126* 137* 109*   < > 157* 143*  116* 102*  BUN 45* 46* 61* 48*   < > 58* 58* 55* 52*  CALCIUM 8.0* 8.2* 8.9 9.5   < > 7.8* 7.8* 7.8* 7.9*  CREATININE 3.48* 3.45* 3.47* 3.14*   < > 4.47* 4.56* 4.58* 4.44*  GFRNONAA 16* 16* 16* 18*   < > 13* 12* 12* 13*  GFRAA 18* 18* 18* 20*  --   --   --   --   --    < > = values in this interval not displayed.       Electronically Signed: Docia Barrier, PA 02/05/2020, 11:29 AM   I spent a total of 15 minutes in face to face in clinical consultation, greater than 50% of which was counseling/coordinating care for venous access.

## 2020-02-05 NOTE — Procedures (Signed)
Interventional Radiology Procedure Note  Procedure: Placement of a right IJ approach triple lumen HD/trialysis.  Tip is positioned at the superior cavoatrial junction and catheter is ready for immediate use.  Complications: None Recommendations:  - Ok to use - Do not submerge - Routine line care   Signed,  Dulcy Fanny. Earleen Newport, DO

## 2020-02-05 NOTE — Progress Notes (Signed)
Pharmacy Antibiotic Note  Paul Price is a 80 y.o. male admitted on 02/01/2020 with SOB and nausea. Found to have MSSA in urine cultures. ID consulted, and blood cultures pending. 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 Patient has been on ceftriaxone for UTI, but now switching to cefazolin per ID.  Pharmacy has been consulted for cefazolin dosing.  Patient with ongoing renal dysfunction. Nephrology following with plans for AVG and HD initiation. Vascular on board as well. Given current renal function not on HD, will initiate cefazolin 2g q12hr and follow plans closely.  Plan: Discontinue ceftriaxone Initiate cefazolin 2g q12hr Follow renal function, HD plans, cultures, and further recommendations/work up   Height: 5\' 6"  (167.6 cm) Weight: 80.2 kg (176 lb 12.9 oz) IBW/kg (Calculated) : 63.8  Temp (24hrs), Avg:98.4 F (36.9 C), Min:98.2 F (36.8 C), Max:98.6 F (37 C)  Recent Labs  Lab 02/01/20 1618 02/02/20 0055 02/03/20 0239 02/03/20 1450 02/04/20 0213 02/05/20 0239  WBC 16.8* 17.3* 16.2*  --  12.6*  --   CREATININE 4.55* 4.55* 4.47* 4.56* 4.58* 4.44*    Estimated Creatinine Clearance: 13 mL/min (A) (by C-G formula based on SCr of 4.44 mg/dL (H)).    Allergies  Allergen Reactions  . Simvastatin Other (See Comments)    Abdominal cramps  . Nitrostat [Nitroglycerin] Other (See Comments)    Causes blood pressure to "bottom out"    Antimicrobials this admission: 1/24 ceftriaxone >> 1/27 1/27 cefazolin >>  Microbiology results: 1/23 UCx MSSA 1/26 BCx pending  Thank you for allowing pharmacy to be a part of this patient's care.  Alfonse Spruce, PharmD PGY2 ID Pharmacy Resident [phone]  02/05/2020 8:10 AM

## 2020-02-05 NOTE — Progress Notes (Signed)
  Speech Language Pathology Treatment: Dysphagia  Patient Details Name: Paul Price MRN: [id number] DOB: 1940/11/04 Today's Date: 02/05/2020 Time: 1638-4536 SLP Time Calculation (min) (ACUTE ONLY): 23 min  Assessment / Plan / Recommendation Clinical Impression  Pt seen for skilled observation of swallowing with various consistencies including thin via cup/straw, puree and solids with adequate mastication noted, swift swallow initiation and no indications of overt s/s of aspiration with trial of POs.  Delayed cough noted in BSE was NOT observed during PO trial this session.  Pt denies dysphagia and wife in agreement.  Regular/thin liquid diet tolerance and medication administration adequate at this time, although pt stated "sometimes pills are hard to get down."  Recommended pills to be given one at a time with liquids and larger pills split if able to allow transition of medications with increased safety. ST will s/o at this time as pt swallowing adequate and no s/s of aspiration noted.    HPI HPI: Pt is an 80 y.o. male with history of paroxysmal atrial fibrillation, chronic kidney disease, hypertension, hyperlipidemia, BPH who presented to the ED with a complaint of shortness of breath and nausea. Pt was just discharged 01/26/2020 after an admission for upper GI bleeding, AKI, COVID-pneumonia. CXR 1/23: Improved left-sided aeration. Cannot exclude mild residual interstitial opacities which could represent scarring or pneumonia.      SLP Plan  Discharge SLP treatment due to (comment)       Recommendations  Diet recommendations: Regular;Thin liquid Liquids provided via: Cup;Straw Medication Administration: Whole meds with liquid Supervision: Patient able to self feed;Intermittent supervision to cue for compensatory strategies Compensations: Slow rate;Small sips/bites                Oral Care Recommendations: Oral care BID Follow up Recommendations: None SLP Visit Diagnosis: Dysphagia,  unspecified (R13.10) Plan: Discharge SLP treatment due to (comment)                      Elvina Sidle, M.S., CCC-SLP 02/05/2020, 12:18 PM

## 2020-02-05 NOTE — Plan of Care (Signed)

## 2020-02-05 NOTE — Progress Notes (Signed)
[facility] Progress Note   Subjective:    Tolerating some liquid intake but still poor appetite.  No new complaints.  S/p temp HD catheter placement.  Out of covid isolation now.  Wife bedside.   Objective Vitals:   02/05/20 0409 02/05/20 0640 02/05/20 0800 02/05/20 1000  BP: (!) 149/63 (!) 136/58    Pulse: 72 72    Resp: 20  18 18   Temp: 98.2 F (36.8 C) 98.6 F (37 C)    TempSrc: Oral Oral    SpO2: 97% 98% 95% 95%  Weight:      Height:       Physical Exam Gen: elderly man flat in bed - alert Eyes: anicteric, EOMI ENT: MM tacky Neck: supple, no JVD CV:  RRR, paced on monitor, no rub Back: clear lungs with normal WOB GU: c no foley Extr:  No edema Neuro: some tremors noted but no asterixis; hard of hearing  Additional Objective Labs: Basic Metabolic Panel: Recent Labs  Lab 02/03/20 1450 02/04/20 0213 02/05/20 0239  NA 147* 146* 145  K 3.3* 3.8 3.4*  CL 113* 113* 114*  CO2 22 20* 22  GLUCOSE 143* 116* 102*  BUN 58* 55* 52*  CREATININE 4.56* 4.58* 4.44*  CALCIUM 7.8* 7.8* 7.9*  PHOS 2.6 3.0 3.2   Liver Function Tests: Recent Labs  Lab 02/01/20 1618 02/02/20 0055 02/03/20 0239 02/03/20 1450 02/04/20 0213 02/05/20 0239  AST 28 28  --   --   --   --   ALT 24 22  --   --   --   --   ALKPHOS 126 109  --   --   --   --   BILITOT 0.8 0.9  --   --   --   --   PROT 5.9* 5.3*  --   --   --   --   ALBUMIN 2.4* 2.2*   < > 2.0* 1.8* 1.8*   < > = values in this interval not displayed.   No results for input(s): LIPASE, AMYLASE in the last 168 hours. CBC: Recent Labs  Lab 02/01/20 1618 02/02/20 0055 02/03/20 0239 02/04/20 0213 02/05/20 0834  WBC 16.8* 17.3* 16.2* 12.6* 10.7*  NEUTROABS 14.5*  --   --   --   --   HGB 9.2* 8.5* 7.9* 7.5* 8.0*  HCT 31.2* 29.7* 26.1* 25.6* 27.2*  MCV 84.1 85.6 82.1 85.9 84.7  PLT 249 226 195 178 169   Blood Culture    Component Value Date/Time   SDES URINE, RANDOM 02/01/2020 1713   SPECREQUEST   02/01/2020 1713    NONE Performed at [facility], [place] [address] [place], [place] [address]    CULT >=100,000 COLONIES/mL STAPHYLOCOCCUS AUREUS (A) 02/01/2020 1713   REPTSTATUS 02/05/2020 FINAL 02/01/2020 1713    Cardiac Enzymes: No results for input(s): CKTOTAL, CKMB, CKMBINDEX, TROPONINI in the last 168 hours. CBG: Recent Labs  Lab 02/04/20 1157 02/04/20 1653 02/04/20 2007 02/05/20 0742 02/05/20 1312  GLUCAP 146* 119* 117* 122* 151*   Iron Studies: No results for input(s): IRON, TIBC, TRANSFERRIN, FERRITIN in the last 72 hours. @lablastinr3 @ Studies/Results: IR Fluoro Guide CV Line Right  Result Date: 02/05/2020 INDICATION: 80 year old male referred for temporary hemodialysis catheter EXAM: IMAGE GUIDED TEMPORARY HEMODIALYSIS CATHETER MEDICATIONS: None. ANESTHESIA/SEDATION: None FLUOROSCOPY TIME:  Fluoroscopy Time: 0 minutes 6 seconds (1 mGy). COMPLICATIONS: None PROCEDURE: Informed written consent was obtained from the patient's family after a discussion of the risks,  benefits, and alternatives to treatment. Questions regarding the procedure were encouraged and answered. The right neck was prepped with chlorhexidine in a sterile fashion, and a sterile drape was applied covering the operative field. Maximum barrier sterile technique with sterile gowns and gloves were used for the procedure. A timeout was performed prior to the initiation of the procedure. A micropuncture kit was utilized to access the right internal jugular vein under direct, real-time ultrasound guidance after the overlying soft tissues were anesthetized with 1% lidocaine with epinephrine. Ultrasound image documentation was performed. The microwire was kinked to measure appropriate catheter length. A stiff glidewire was advanced to the level of the IVC. A 19 cm hemodialysis catheter was then placed over the wire. Final catheter positioning was confirmed and documented with a spot radiographic image. The catheter  aspirates and flushes normally. The catheter was flushed with appropriate volume heparin dwells. Dressings were applied. The patient tolerated the procedure well without immediate post procedural complication. . IMPRESSION: Status post image guided right IJ temporary hemodialysis catheter. Catheter ready for use. Signed, Dulcy Fanny. Dellia Nims, RPVI [facility] Radiology Electronically Signed   By: Corrie Mckusick D.O.   On: 02/05/2020 12:43   IR US Guide Vasc Access Right  Result Date: 02/05/2020 INDICATION: 80 year old male referred for temporary hemodialysis catheter EXAM: IMAGE GUIDED TEMPORARY HEMODIALYSIS CATHETER MEDICATIONS: None. ANESTHESIA/SEDATION: None FLUOROSCOPY TIME:  Fluoroscopy Time: 0 minutes 6 seconds (1 mGy). COMPLICATIONS: None PROCEDURE: Informed written consent was obtained from the patient's family after a discussion of the risks, benefits, and alternatives to treatment. Questions regarding the procedure were encouraged and answered. The right neck was prepped with chlorhexidine in a sterile fashion, and a sterile drape was applied covering the operative field. Maximum barrier sterile technique with sterile gowns and gloves were used for the procedure. A timeout was performed prior to the initiation of the procedure. A micropuncture kit was utilized to access the right internal jugular vein under direct, real-time ultrasound guidance after the overlying soft tissues were anesthetized with 1% lidocaine with epinephrine. Ultrasound image documentation was performed. The microwire was kinked to measure appropriate catheter length. A stiff glidewire was advanced to the level of the IVC. A 19 cm hemodialysis catheter was then placed over the wire. Final catheter positioning was confirmed and documented with a spot radiographic image. The catheter aspirates and flushes normally. The catheter was flushed with appropriate volume heparin dwells.  Dressings were applied. The patient tolerated the procedure well without immediate post procedural complication. . IMPRESSION: Status post image guided right IJ temporary hemodialysis catheter. Catheter ready for use. Signed, Dulcy Fanny. Dellia Nims, RPVI [facility] Radiology Electronically Signed   By: Corrie Mckusick D.O.   On: 02/05/2020 12:43   Medications: .  ceFAZolin (ANCEF) IV 2 g (02/05/20 1016)  . dextrose 5 % and 0.45% NaCl 100 mL/hr at 02/05/20 1015   . amLODipine  2.5 mg Oral Daily  . atorvastatin  20 mg Oral Daily  . Chlorhexidine Gluconate Cloth  6 each Topical Q0600  . ferrous sulfate  325 mg Oral BID WC  . gelatin adsorbable      . heparin sodium (porcine)      . insulin aspart  0-6 Units Subcutaneous TID WC  . lidocaine      . metoprolol succinate  25 mg Oral Daily  . pantoprazole  40 mg Oral BID  . sodium bicarbonate  650 mg Oral TID  . tamsulosin  0.4 mg Oral Daily   Assessment/Plan **AKI on CKD 5:  Initially thought to be prerenal but hasn't improved with volume resuscitation.  eGFR 12 is likely overestimation with advanced age and low muscle mass; 24h urine has been logistically impossible.  Perhaps the underlying issue is uremia leading to nausea and we have decided to proceed with trial of dialysis.  In light of concern for bacteremia IR placed RIJ temp cath today.  VVS available for perm access once infectious issues resolved.  Due to schedule constraints HD will be tomorrow AM.  **Staph UTI/ concern for bacteremia:  ID following.  On cephazolin.  TEE tomorrow.  Blood cultures pending, haven't resulted yet.  **Nausea/emesis/anorexia:  Abd X ray today unrevealing.  Persistent.  Trial of dialysis as this may be a uremic symptom.  **Anemia:  On po iron, trend H/H; no indication for transfusion currently but into 8s now.  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  **Hypernatremia:  resolved with free water  administration  **Hypokalemia:  Further repleted, normal now. 4K dialysate.   **recent COVID:  S/p steroids and remdesivir last admission; CXR improved, ok on RA.   Off precautions per [facility] policy.   **A fib:  Paced currently, xarelto on hold for now -- would consider coumadin or eliquis if resumed this admission given eGFR < 15.   **Renal mass: noted on [place] last admission, plans for outpt urology f/u.  I discussed with pt and wife 1/27 - they were unaware of this.   Jannifer Hick MD 02/05/2020, 1:53 PM  [facility] Pager: [phone]

## 2020-02-05 NOTE — Progress Notes (Signed)
    [facility] has been requested to perform a transesophageal echocardiogram on 02/06/20 for bacteremia.  After careful review of history and examination, the risks and benefits of transesophageal echocardiogram have been explained including risks of esophageal damage, perforation (1:10,000 risk), bleeding, pharyngeal hematoma as well as other potential complications associated with conscious sedation including aspiration, arrhythmia, respiratory failure and death. Alternatives to treatment were discussed, questions were answered. Patient is willing to proceed.   TEE scheduled for 02/06/20 at 1:30pm with Dr. Oval Linsey.  Roby Lofts, PA-C 02/05/2020 2:27 PM

## 2020-02-05 NOTE — Progress Notes (Signed)
PROGRESS NOTE    Paul Price  ASN:[id number] DOB: Oct 20, 1940 DOA: 02/01/2020 PCP: [name], MD   Brief Narrative: 80 year old with past medical history significant for paroxysmal A. fib, chronic kidney disease, hypertension, hyperlipidemia, BPH who presented to the ED complaining of shortness of breath and nausea.  Patient was recently discharged on 01/26/2020 after admission for upper GI bleed, AKI and Covid pneumonia.  Patient finished a course of breath then severe and is steroids.  He did not have oxygen requirement at that time.  Patient was evaluated by GI at that time and advised to hold Xarelto until 01/31/2020.  Patient did not resume Xarelto at home.  Patient was discharged with a creatinine of 3.7.  He presents with nausea vomiting, unable to keep anything down.  Patient admitted with poor oral intake, dehydration, hyponatremia, nausea and AKI.    Assessment & Plan:   Principal Problem:   AKI (acute kidney injury) ([place]) Active Problems:   HTN (hypertension)   Hyperlipidemia with target LDL less than 70   OSA on CPAP   PAF (paroxysmal atrial fibrillation) (HCC)   COVID-19 virus infection   Intractable nausea and vomiting   Hypernatremia   Elevated troponin  1-AKI on CKD stage IV: Recent creatinine at discharge was 3.7.  Presented with a creatinine of 4.5 Continue to hold Lasix. Appreciate nephrology evaluation.  Poor oral intake. Cr at 4.4.  Nephrology planning trial of HD> for HD catheter placement today Might have to delay SV fistula placement, awaitng for TEE.   2-Hyponatremia: Related to hypovolemia poor oral intake. Resolved With IV fluids.  Hypernatremia;  On IV fluids.  Improved.   3-Hypokalemia: To be corrected with HD today.   4-Staph Aureus in urine.  UTI: urine culture growing Staph aureus.  Continue with cefazolin. TEE on Friday.  Appreciate ID recommendations.  Blood culture obtain 1/26: pending  HFrEF;  Echo 2021 ejection fraction 40  to 45% Monitor volume status Continue to hold IV Lasix  Nausea and vomiting; He denies abdominal pain. KUB negative for bowel obstruction Supportive care  Elevated troponin: Most likely demand ischemia and worsening renal function. Denies chest pain  Generalized weakness: PT eval  Hypertension: Continue with Norvasc and Metoprolol  Covid positive, tested 01/20/2020 Completed a course of rhythm severe and IV steroid Per Dr Baxter Flattery, ok to discontinue isolation.   A. Fib: Per GI he was supposed to be with Xarelto 1/22. Hemoglobin is slowly trending down, continue to hold Xarelto for now. For HD access and fistula.     Estimated body mass index is 28.54 kg/m as calculated from the following:   Height as of this encounter: 5\' 6"  (1.676 m).   Weight as of this encounter: 80.2 kg.   DVT prophylaxis: Scd Code Status: Full code Family Communication: care discussed with wife who was at bedside.  Disposition Plan:  Status is: Inpatient  Remains inpatient appropriate because:Persistent severe electrolyte disturbances   Dispo: The patient is from: Home              Anticipated d/c is to: Home              Anticipated d/c date is: 3 days              Patient currently is not medically stable to d/c.   Difficult to place patient No        Consultants:   Nephrology,.   Procedures:  none Antimicrobials:  Ceftriaxone   Subjective: He is sleepy.  Per wife he was up in recliner all morning. He is taking a nap. He wake up said " every time I try to sleep someone wake me up" . Back to sleep.   Per wife he ate some breakfast.    Objective: Vitals:   02/04/20 1952 02/05/20 0409 02/05/20 0640 02/05/20 1000  BP: (!) 152/64 (!) 149/63 (!) 136/58   Pulse: 60 72 72   Resp: 17 20  18   Temp: 98.4 F (36.9 C) 98.2 F (36.8 C) 98.6 F (37 C)   TempSrc: Oral Oral Oral   SpO2: 98% 97% 98% 95%  Weight:      Height:        Intake/Output Summary (Last 24 hours) at  02/05/2020 1209 Last data filed at 02/05/2020 0805 Gross per 24 hour  Intake 748.52 ml  Output -  Net 748.52 ml   Filed Weights   02/02/20 1952  Weight: 80.2 kg    Examination:  General exam: NAD Respiratory system: CTA Cardiovascular system: S 1, S 2 RRR Gastrointestinal system: BS present, soft, nt Central nervous system: Alert and oriented.  Extremities:  No edema     Data Reviewed: I have personally reviewed following labs and imaging studies  CBC: Recent Labs  Lab 02/01/20 1618 02/02/20 0055 02/03/20 0239 02/04/20 0213 02/05/20 0834  WBC 16.8* 17.3* 16.2* 12.6* 10.7*  NEUTROABS 14.5*  --   --   --   --   HGB 9.2* 8.5* 7.9* 7.5* 8.0*  HCT 31.2* 29.7* 26.1* 25.6* 27.2*  MCV 84.1 85.6 82.1 85.9 84.7  PLT 249 226 195 178 161   Basic Metabolic Panel: Recent Labs  Lab 02/02/20 0055 02/03/20 0239 02/03/20 1450 02/04/20 0213 02/05/20 0239  NA 150* 145 147* 146* 145  K 3.0* 2.9* 3.3* 3.8 3.4*  CL 117* 112* 113* 113* 114*  CO2 23 23 22  20* 22  GLUCOSE 155* 157* 143* 116* 102*  BUN 65* 58* 58* 55* 52*  CREATININE 4.55* 4.47* 4.56* 4.58* 4.44*  CALCIUM 8.0* 7.8* 7.8* 7.8* 7.9*  MG 2.1  --   --   --   --   PHOS  --  2.5 2.6 3.0 3.2   GFR: Estimated Creatinine Clearance: 13 mL/min (A) (by C-G formula based on SCr of 4.44 mg/dL (H)). Liver Function Tests: Recent Labs  Lab 02/01/20 1618 02/02/20 0055 02/03/20 0239 02/03/20 1450 02/04/20 0213 02/05/20 0239  AST 28 28  --   --   --   --   ALT 24 22  --   --   --   --   ALKPHOS 126 109  --   --   --   --   BILITOT 0.8 0.9  --   --   --   --   PROT 5.9* 5.3*  --   --   --   --   ALBUMIN 2.4* 2.2* 1.9* 2.0* 1.8* 1.8*   No results for input(s): LIPASE, AMYLASE in the last 168 hours. No results for input(s): AMMONIA in the last 168 hours. Coagulation Profile: No results for input(s): INR, PROTIME in the last 168 hours. Cardiac Enzymes: No results for input(s): CKTOTAL, CKMB, CKMBINDEX, TROPONINI in the  last 168 hours. BNP (last 3 results) No results for input(s): PROBNP in the last 8760 hours. HbA1C: Recent Labs    02/04/20 0213  HGBA1C 6.1*   CBG: Recent Labs  Lab 02/04/20 0727 02/04/20 1157 02/04/20 1653 02/04/20 2007 02/05/20 0742  GLUCAP 129* 146*  119* 117* 122*   Lipid Profile: No results for input(s): CHOL, HDL, LDLCALC, TRIG, CHOLHDL, LDLDIRECT in the last 72 hours. Thyroid Function Tests: No results for input(s): TSH, T4TOTAL, FREET4, T3FREE, THYROIDAB in the last 72 hours. Anemia Panel: No results for input(s): VITAMINB12, FOLATE, FERRITIN, TIBC, IRON, RETICCTPCT in the last 72 hours. Sepsis Labs: Recent Labs  Lab 02/02/20 0055 02/03/20 0239  PROCALCITON 0.36 0.45    Recent Results (from the past 240 hour(s))  Urine culture     Status: Abnormal   Collection Time: 02/01/20  5:13 PM   Specimen: Urine, Random  Result Value Ref Range Status   Specimen Description URINE, RANDOM  Final   Special Requests   Final    NONE Performed at [facility], [address] [place], [place] [address]    Culture >=100,000 COLONIES/mL STAPHYLOCOCCUS AUREUS (A)  Final   Report Status 02/05/2020 FINAL  Final   Organism ID, Bacteria STAPHYLOCOCCUS AUREUS (A)  Final      Susceptibility   Staphylococcus aureus - MIC*    CIPROFLOXACIN <=0.5 SENSITIVE Sensitive     GENTAMICIN <=0.5 SENSITIVE Sensitive     NITROFURANTOIN <=16 SENSITIVE Sensitive     OXACILLIN <=0.25 SENSITIVE Sensitive     TETRACYCLINE <=1 SENSITIVE Sensitive     VANCOMYCIN <=0.5 SENSITIVE Sensitive     TRIMETH/SULFA <=10 SENSITIVE Sensitive     CLINDAMYCIN <=0.25 SENSITIVE Sensitive     RIFAMPIN <=0.5 SENSITIVE Sensitive     Inducible Clindamycin NEGATIVE Sensitive     * >=100,000 COLONIES/mL STAPHYLOCOCCUS AUREUS         Radiology Studies: No results found.      Scheduled Meds: . amLODipine  2.5 mg Oral Daily  . atorvastatin  20 mg Oral Daily  . Chlorhexidine Gluconate Cloth  6 each  Topical Q0600  . ferrous sulfate  325 mg Oral BID WC  . heparin sodium (porcine)      . insulin aspart  0-6 Units Subcutaneous TID WC  . lidocaine      . metoprolol succinate  25 mg Oral Daily  . pantoprazole  40 mg Oral BID  . sodium bicarbonate  650 mg Oral TID  . tamsulosin  0.4 mg Oral Daily   Continuous Infusions: .  ceFAZolin (ANCEF) IV 2 g (02/05/20 1016)  . dextrose 5 % and 0.45% NaCl 100 mL/hr at 02/05/20 1015     LOS: 3 days    Time spent: 35 minutes    Arnold Kester A Marqual Mi, MD [facility]   If 7PM-7AM, please contact night-coverage [email or web address]  02/05/2020, 12:09 PM

## 2020-02-06 ENCOUNTER — Inpatient Hospital Stay (HOSPITAL_COMMUNITY): Payer: Medicare Other | Admitting: Certified Registered Nurse Anesthetist

## 2020-02-06 ENCOUNTER — Encounter (HOSPITAL_COMMUNITY)
Admission: EM | Disposition: A | Discharge status: Home Health | Payer: Self-pay | Source: Home / Self Care | Attending: Internal Medicine

## 2020-02-06 ENCOUNTER — Encounter (HOSPITAL_COMMUNITY): Payer: Self-pay | Admitting: Family Medicine

## 2020-02-06 ENCOUNTER — Inpatient Hospital Stay (HOSPITAL_COMMUNITY): Payer: Medicare Other

## 2020-02-06 DIAGNOSIS — I5022 Chronic systolic (congestive) heart failure: Secondary | ICD-10-CM

## 2020-02-06 DIAGNOSIS — I340 Nonrheumatic mitral (valve) insufficiency: Secondary | ICD-10-CM | POA: Diagnosis not present

## 2020-02-06 DIAGNOSIS — N185 Chronic kidney disease, stage 5: Secondary | ICD-10-CM | POA: Diagnosis not present

## 2020-02-06 DIAGNOSIS — N179 Acute kidney failure, unspecified: Secondary | ICD-10-CM | POA: Diagnosis not present

## 2020-02-06 DIAGNOSIS — U071 COVID-19: Secondary | ICD-10-CM | POA: Diagnosis not present

## 2020-02-06 DIAGNOSIS — R7881 Bacteremia: Secondary | ICD-10-CM | POA: Diagnosis not present

## 2020-02-06 DIAGNOSIS — I513 Intracardiac thrombosis, not elsewhere classified: Secondary | ICD-10-CM

## 2020-02-06 DIAGNOSIS — I361 Nonrheumatic tricuspid (valve) insufficiency: Secondary | ICD-10-CM | POA: Diagnosis not present

## 2020-02-06 DIAGNOSIS — I10 Essential (primary) hypertension: Secondary | ICD-10-CM | POA: Diagnosis not present

## 2020-02-06 HISTORY — PX: TEE WITHOUT CARDIOVERSION: SHX5443

## 2020-02-06 LAB — GLUCOSE, CAPILLARY
Glucose-Capillary: 112 mg/dL — ABNORMAL HIGH (ref 70–99)
Glucose-Capillary: 100 mg/dL — ABNORMAL HIGH (ref 70–99)
Glucose-Capillary: 100 mg/dL — ABNORMAL HIGH (ref 70–99)

## 2020-02-06 LAB — RENAL FUNCTION PANEL
Albumin: 1.7 g/dL — ABNORMAL LOW (ref 3.5–5.0)
Anion gap: 12 (ref 5–15)
BUN: 49 mg/dL — ABNORMAL HIGH (ref 8–23)
CO2: 18 mmol/L — ABNORMAL LOW (ref 22–32)
Calcium: 7.7 mg/dL — ABNORMAL LOW (ref 8.9–10.3)
Chloride: 113 mmol/L — ABNORMAL HIGH (ref 98–111)
Creatinine, Ser: 4.25 mg/dL — ABNORMAL HIGH (ref 0.61–1.24)
GFR, Estimated: 13 mL/min — ABNORMAL LOW (ref >60)
Glucose, Bld: 109 mg/dL — ABNORMAL HIGH (ref 70–99)
Phosphorus: 2.9 mg/dL (ref 2.5–4.6)
Potassium: 4 mmol/L (ref 3.5–5.1)
Sodium: 143 mmol/L (ref 135–145)

## 2020-02-06 LAB — CBC
HCT: 26.3 % — ABNORMAL LOW (ref 39.0–52.0)
Hemoglobin: 7.8 g/dL — ABNORMAL LOW (ref 13.0–17.0)
MCH: 25 pg — ABNORMAL LOW (ref 26.0–34.0)
MCHC: 29.7 g/dL — ABNORMAL LOW (ref 30.0–36.0)
MCV: 84.3 fL (ref 80.0–100.0)
Platelets: 200 10*3/uL (ref 150–400)
RBC: 3.12 MIL/uL — ABNORMAL LOW (ref 4.22–5.81)
RDW: 19.9 % — ABNORMAL HIGH (ref 11.5–15.5)
WBC: 11.8 10*3/uL — ABNORMAL HIGH (ref 4.0–10.5)
nRBC: 0 % (ref 0.0–0.2)

## 2020-02-06 SURGERY — ECHOCARDIOGRAM, TRANSESOPHAGEAL
Anesthesia: Monitor Anesthesia Care

## 2020-02-06 SURGERY — ARTERIOVENOUS (AV) FISTULA CREATION
Anesthesia: Choice | Laterality: Right

## 2020-02-06 MED ORDER — PROPOFOL 500 MG/50ML IV EMUL
INTRAVENOUS | Status: DC | PRN
  Administered 2020-02-06: 50 ug/kg/min via INTRAVENOUS

## 2020-02-06 MED ORDER — SODIUM CHLORIDE 0.9% FLUSH: 10.0000 mL | INTRAVENOUS | Status: DC | PRN

## 2020-02-06 MED ORDER — SODIUM CHLORIDE 0.9% FLUSH
10.0000 mL | Freq: Two times a day (BID) | INTRAVENOUS | Status: DC
  Administered 2020-02-07 – 2020-02-12 (×6): 10 mL

## 2020-02-06 MED ORDER — SODIUM CHLORIDE 0.9 % IV SOLN
510.0000 mg | Freq: Once | INTRAVENOUS | Status: AC
  Administered 2020-02-06: 510 mg via INTRAVENOUS
  Filled 2020-02-06: qty 17

## 2020-02-06 MED ORDER — SODIUM CHLORIDE 0.9 % IV SOLN: INTRAVENOUS | Status: DC

## 2020-02-06 MED ORDER — HEPARIN (PORCINE) 25000 UT/250ML-% IV SOLN
1200.0000 [IU]/h | INTRAVENOUS | Status: DC
  Administered 2020-02-06: 1200 [IU]/h via INTRAVENOUS
  Filled 2020-02-06: qty 250

## 2020-02-06 NOTE — Anesthesia Procedure Notes (Signed)
Procedure Name: MAC Date/Time: 02/06/2020 1:00 PM Performed by: Lowella Dell, CRNA Pre-anesthesia Checklist: Patient identified, Emergency Drugs available, Suction available, Patient being monitored and Timeout performed Patient Re-evaluated:Patient Re-evaluated prior to induction Oxygen Delivery Method: Nasal cannula Placement Confirmation: positive ETCO2 Dental Injury: Teeth and Oropharynx as per pre-operative assessment

## 2020-02-06 NOTE — Anesthesia Preprocedure Evaluation (Signed)
Anesthesia Evaluation  Patient identified by MRN, date of birth, ID band Patient awake    Reviewed: Allergy & Precautions, H&P , NPO status , Patient's Chart, lab work & pertinent test results, reviewed documented beta blocker date and time   Airway Mallampati: II  TM Distance: >3 FB Neck ROM: Full    Dental no notable dental hx. (+) Teeth Intact, Dental Advisory Given   Pulmonary sleep apnea , former smoker,    Pulmonary exam normal breath sounds clear to auscultation       Cardiovascular hypertension, Pt. on medications and Pt. on home beta blockers + CAD, + CABG and +CHF  + dysrhythmias  Rhythm:Regular Rate:Normal     Neuro/Psych negative neurological ROS  negative psych ROS   GI/Hepatic Neg liver ROS, GERD  Medicated,  Endo/Other  negative endocrine ROS  Renal/GU Renal InsufficiencyRenal disease  negative genitourinary   Musculoskeletal  (+) Arthritis , Osteoarthritis,    Abdominal   Peds  Hematology  (+) Blood dyscrasia, anemia ,   Anesthesia Other Findings   Reproductive/Obstetrics negative OB ROS                             Anesthesia Physical Anesthesia Plan  ASA: III  Anesthesia Plan: MAC   Post-op Pain Management:    Induction: Intravenous  PONV Risk Score and Plan: 1 and Propofol infusion and Treatment may vary due to age or medical condition  Airway Management Planned: Nasal Cannula  Additional Equipment:   Intra-op Plan:   Post-operative Plan:   Informed Consent: I have reviewed the patients History and Physical, chart, labs and discussed the procedure including the risks, benefits and alternatives for the proposed anesthesia with the patient or authorized representative who has indicated his/her understanding and acceptance.     Dental advisory given  Plan Discussed with: CRNA  Anesthesia Plan Comments:         Anesthesia Quick Evaluation

## 2020-02-06 NOTE — Consult Note (Addendum)
Cardiology Consultation:   Patient ID: Paul Price MRN: [id number]; DOB: 02-Sep-1940  Admit date: 02/01/2020 Date of Consult: 02/06/2020  Primary Care Provider: [name], MD [facility] Cardiologist: Shelva Majestic, MD    Patient Profile:   Paul Price is a 80 y.o. male with a hx of CAD s/p CABG and subsequent PCI to LAD in 2012, paroxysmal atrial fibrillation, second-degree AV block s/p pacemaker, chronic systolic heart failure with LV function of 40 to 45%, chronic kidney disease stage V s/p right nephrectomy for renal cell carcinoma, DVT in 2015, RBBB, obstructive sleep apnea, hyperlipidemia, hypertension, GI bleed on anticoagulation and anemia who is being seen today for the evaluation of LA thrombus at the request of Dr. Tyrell Antonio.  Last cath in 2012 with successful stenting to his LAD beyond the diagonal with a 3.0x20 mm Promus DES stent postdilated 3.25 mm.  Last stress test 04/2016 was low risk.  Last echocardiogram December 2021 with LV function of 40 to 45%, elevated LVEDP, normal RV function, mild dilatation of aortic root measuring 39 mm.  Mild mitral regurgitation.  History of GI bleed in 2018. Admitted November 2021 secondary to symptomatic anemia.  Thought related to gastritis.  He did not require transfusion during admission.  History of Present Illness:   Paul Price recently admitted 01/20/20-01/26/20 for GI bleeding and COVID-19 infection.  He was not hypoxic.  Treated with steroid and remdesivir.  He was transfused 1 unit of blood prior to admission in outpatient setting secondary to anemia.  His anticoagulation (Xarelto) held and recommended to start January 21.  He was hydrated for AKI.  Patient has solitary left kidney.  During admission he was found to have nodules on left kidney and recommended to follow-up with urology as outpatient.  Patient presented back 02/01/20 with poor p.o. intake, dehydration, nausea who was found to have worsening renal function of 4.5  from discharge creatinine of 3.7.  Seen by nephrology and plan for HD catheter placement.  Hospital course complicated by MSSA UTI in setting of AKI.  No evidence of endocarditis by transthoracic echocardiogram on 01/08/20.  He underwent transesophageal echocardiogram today showing LA thrombus without evidence of endocarditis.  Mild to moderate TR.  Trivial PR.  Mild mitral regurgitation.  EF 40 to 45%.  Small PFO. His Xarelto has been held since last admit.  Did not started anticoagulation this admission secondary to downtrending hemoglobin.  Now cardiology is asked for further management. Hgb stable between 7-8.   Scr 4.25 Hgb 7.8  TEE 02/06/20 1. Global hypokinesis worse in the septum. Left ventricular ejection  fraction, by estimation, is 40 to 45%. The left ventricle has mildly  decreased function. The left ventricle demonstrates global hypokinesis.  Left ventricular diastolic function could  not be evaluated.  2. Right ventricular systolic function is mildly reduced. The right  ventricular size is normal.  3. Thrombus in the left atrial appendage measuring 1.5 x 0.6 cm. Left  atrial size was mildly dilated. A left atrial/left atrial appendage  thrombus was detected. The LAA emptying velocity was 47 cm/s.  4. Pacemaker wire.  5. The mitral valve is normal in structure. Mild mitral valve  regurgitation. No evidence of mitral stenosis.  6. Tricuspid valve regurgitation is mild to moderate.  7. The aortic valve is tricuspid. Aortic valve regurgitation is not  visualized. No aortic stenosis is present.  8. There is mild (Grade II) atheroma plaque involving the descending  aorta.  9. The inferior vena cava is  normal in size with greater than 50%  respiratory variability, suggesting right atrial pressure of 3 mmHg.  10. Evidence of atrial level shunting detected by color flow Doppler.  There is a small patent foramen ovale with predominantly left to right  shunting across the  atrial septum.   Past Medical History:  Diagnosis Date  . Arthritis    "shoulders" (09/07/2016)  . Atrial fibrillation ([place])   . BPH (benign prostatic hyperplasia)   . Chronic kidney disease (CKD), stage III (moderate) (HCC)   . Coronary artery disease   . DDD (degenerative disc disease), cervical   . DVT (deep venous thrombosis) ([place]) 12/2013   LLE  . GERD (gastroesophageal reflux disease)   . Gout   . High cholesterol   . History of blood transfusion 09/06/2016   2 u PRBC  . History of scarlet fever 1940s  . History of stress test 06/2011   No significant ischemia, this is a low risk scan. Clinical correlation recommended Abnormal myocardial perfusion study.  Marland Kitchen Hx of echocardiogram 05/2009   EF 40-45%, he did have mild annular calcification with mild-to-moderate MR and mild TR as well as aortic valve sclerosis. Estimated RV systolic pressure was 21 mm.  . Hypertension   . Iron deficiency anemia   . Kidney failure   . 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 Neck pain    "not chronic" (09/07/2016)  . Obesity   . OSA on CPAP   . RBBB   . Renal insufficiency   . Right renal mass   . Spinal stenosis of lumbar region     Past Surgical History:  Procedure Laterality Date  . APPENDECTOMY    . CARDIAC CATHETERIZATION  2009   he was found to have a atretic LIMA graft to his LAD and had a patent vein graft supplying his diagonal vessel. At that time, he had 70% narrowing in his LAD beyond a diagonal vessel which was initially treated medically.  . COLONOSCOPY WITH PROPOFOL Left 09/09/2016   Procedure: COLONOSCOPY WITH PROPOFOL;  Surgeon: Arta Silence, MD;  Location: [place];  Service: Endoscopy;  Laterality: Left;  . CORONARY ANGIOPLASTY WITH STENT PLACEMENT  April 2012   LAD DES  . CORONARY ARTERY BYPASS GRAFT  1992   with LIMA to the LAD and diagonal.  . CYSTOSCOPY N/A 03/31/2019   Procedure: CYSTOSCOPY FLEXIBLE;  Surgeon: Lucas Mallow, MD;  Location: [facility];   Service: Urology;  Laterality: N/A;  . CYSTOSCOPY WITH URETHRAL DILATATION N/A 05/21/2019   Procedure: CYSTOSCOPY WITH URETHRAL BALLOON DILATATION;  Surgeon: Lucas Mallow, MD;  Location: [facility];  Service: Urology;  Laterality: N/A;  . ESOPHAGOGASTRODUODENOSCOPY N/A 11/16/2019   Procedure: ESOPHAGOGASTRODUODENOSCOPY (EGD);  Surgeon: Wilford Corner, MD;  Location: [place];  Service: Endoscopy;  Laterality: N/A;  . ESOPHAGOGASTRODUODENOSCOPY (EGD) WITH PROPOFOL Left 09/08/2016   Procedure: ESOPHAGOGASTRODUODENOSCOPY (EGD) WITH PROPOFOL;  Surgeon: Ronnette Juniper, MD;  Location: [place];  Service: Gastroenterology;  Laterality: Left;  . GIVENS CAPSULE STUDY N/A 11/16/2019   Procedure: GIVENS CAPSULE STUDY;  Surgeon: Wilford Corner, MD;  Location: [facility];  Service: Endoscopy;  Laterality: N/A;  . INGUINAL HERNIA REPAIR     "don't remeimber which side"  . IR FLUORO GUIDE CV LINE RIGHT  02/05/2020  . IR US GUIDE VASC ACCESS RIGHT  02/05/2020  . LAPAROSCOPIC NEPHRECTOMY Right 03/31/2019   Procedure: RIGHT  HAND ASSIST LAPAROSCOPIC NEPHRECTOMY;  Surgeon: Lucas Mallow, MD;  Location:  [facility];  Service: Urology;  Laterality: Right;  . PACEMAKER IMPLANT N/A 12/25/2016   Procedure: PACEMAKER IMPLANT;  Surgeon: Deboraha Sprang, MD;  Location: [place] CV LAB;  Service: Cardiovascular;  Laterality: N/A;  . TONSILLECTOMY      Inpatient Medications: Scheduled Meds: . amLODipine  2.5 mg Oral Daily  . atorvastatin  20 mg Oral Daily  . Chlorhexidine Gluconate Cloth  6 each Topical Q0600  . insulin aspart  0-6 Units Subcutaneous TID WC  . metoprolol succinate  25 mg Oral Daily  . pantoprazole  40 mg Oral BID  . sodium bicarbonate  650 mg Oral TID  . tamsulosin  0.4 mg Oral Daily   Continuous Infusions: . sodium chloride    . sodium chloride    . sodium chloride 10 mL/hr at 02/06/20 1126  .  ceFAZolin (ANCEF) IV 2 g (02/06/20 0957)  . dextrose 5 % and 0.45% NaCl 100 mL/hr at  02/06/20 0272  . ferumoxytol     PRN Meds: sodium chloride, sodium chloride, acetaminophen **OR** acetaminophen, alteplase, heparin, lidocaine (PF), lidocaine-prilocaine, ondansetron **OR** ondansetron (ZOFRAN) IV, oxyCODONE, pentafluoroprop-tetrafluoroeth, polyethylene glycol, promethazine  Allergies:    Allergies  Allergen Reactions  . Simvastatin Other (See Comments)    Abdominal cramps  . Nitrostat [Nitroglycerin] Other (See Comments)    Causes blood pressure to "bottom out"    Social History:   Social History   Socioeconomic History  . Marital status: Married    Spouse name: Not on file  . Number of children: Not on file  . Years of education: Not on file  . Highest education level: Not on file  Occupational History  . Not on file  Tobacco Use  . Smoking status: Former Smoker    Packs/day: 1.00    Years: 30.00    Pack years: 30.00    Types: Cigarettes  . Smokeless tobacco: Never Used  . Tobacco comment: quit ~ 1985  Vaping Use  . Vaping Use: Never used  Substance and Sexual Activity  . Alcohol use: Yes    Alcohol/week: 0.0 standard drinks    Comment: rare beer  . Drug use: No  . Sexual activity: Not Currently  Other Topics Concern  . Not on file  Social History Narrative  . Not on file   Social Determinants of Health   Financial Resource Strain: Not on file  Food Insecurity: Not on file  Transportation Needs: Not on file  Physical Activity: Not on file  Stress: Not on file  Social Connections: Not on file  Intimate Partner Violence: Not on file    Family History:   Family History  Problem Relation Age of Onset  . Heart disease Father      ROS:  Please see the history of present illness. All other ROS reviewed and negative.     Physical Exam/Data:   Vitals:   02/06/20 1340 02/06/20 1350 02/06/20 1400 02/06/20 1415  BP: (!) 131/44 (!) 135/46 (!) 142/43 (!) 138/53  Pulse: 62 60 62 61  Resp: (!) 22 18 20 20   Temp:    98 F (36.7 C)   TempSrc:    Oral  SpO2: 99% 96% 96% 96%  Weight:      Height:        Intake/Output Summary (Last 24 hours) at 02/06/2020 1554 Last data filed at 02/06/2020 1315 Gross per 24 hour  Intake 650 ml  Output 300 ml  Net 350 ml   Last 3 Weights 02/02/2020  01/25/2020 01/22/2020  Weight (lbs) 176 lb 12.9 oz 184 lb 1.4 oz 185 lb  Weight (kg) 80.2 kg 83.5 kg 83.915 kg     Body mass index is 28.54 kg/m.  General:  Well nourished, well developed, in no acute distress HEENT: normal Lymph: no adenopathy Neck: no JVD Endocrine:  No thryomegaly Vascular: No carotid bruits; FA pulses 2+ bilaterally without bruits  Cardiac:  normal S1, S2; RRR; no murmur  Lungs:  clear to auscultation bilaterally, no wheezing, rhonchi or rales  Abd: soft, nontender, no hepatomegaly  Ext: no edema Musculoskeletal:  No deformities, BUE and BLE strength normal and equal Skin: warm and dry  Neuro:  CNs 2-12 intact, no focal abnormalities noted Psych:  Normal affect   EKG:  The EKG was personally reviewed and demonstrates:  V paced rhythm  Telemetry:  Telemetry was personally reviewed and demonstrates:  V paced rhythm at controlled rate   Relevant CV Studies:  Echo 12/2029 1. Left ventricular ejection fraction, by estimation, is 40 to 45%. The  left ventricle has mildly decreased function. Left ventricular endocardial  border not optimally defined to evaluate regional wall motion. Left  ventricular diastolic function could  not be evaluated. Elevated left ventricular end-diastolic pressure.  2. Right ventricular systolic function is normal. The right ventricular  size is normal. There is mildly elevated pulmonary artery systolic  pressure. The estimated right ventricular systolic pressure is 72.6 mmHg.  3. Left atrial size was mildly dilated.  4. The mitral valve is normal in structure. Mild mitral valve  regurgitation. No evidence of mitral stenosis.  5. The aortic valve is normal in structure. Aortic  valve regurgitation is  not visualized. No aortic stenosis is present.  6. Aortic dilatation noted. There is mild dilatation of the aortic root,  measuring 39 mm.  7. The inferior vena cava is normal in size with <50% respiratory  variability, suggesting right atrial pressure of 8 mmHg.  8. Compared to study of 2018, PASP has decreased from 61 to 31mHg.  Laboratory Data:  High Sensitivity Troponin:   Recent Labs  Lab 02/01/20 1618 02/01/20 1844  TROPONINIHS 90* 82*     Chemistry Recent Labs  Lab 02/04/20 0213 02/05/20 0239 02/06/20 0146  NA 146* 145 143  K 3.8 3.4* 4.0  CL 113* 114* 113*  CO2 20* 22 18*  GLUCOSE 116* 102* 109*  BUN 55* 52* 49*  CREATININE 4.58* 4.44* 4.25*  CALCIUM 7.8* 7.9* 7.7*  GFRNONAA 12* 13* 13*  ANIONGAP 13 9 12     Recent Labs  Lab 02/01/20 1618 02/02/20 0055 02/03/20 0239 02/04/20 0213 02/05/20 0239 02/06/20 0146  PROT 5.9* 5.3*  --   --   --   --   ALBUMIN 2.4* 2.2*   < > 1.8* 1.8* 1.7*  AST 28 28  --   --   --   --   ALT 24 22  --   --   --   --   ALKPHOS 126 109  --   --   --   --   BILITOT 0.8 0.9  --   --   --   --    < > = values in this interval not displayed.   Hematology Recent Labs  Lab 02/04/20 0213 02/05/20 0834 02/06/20 0805  WBC 12.6* 10.7* 11.8*  RBC 2.98* 3.21* 3.12*  HGB 7.5* 8.0* 7.8*  HCT 25.6* 27.2* 26.3*  MCV 85.9 84.7 84.3  MCH 25.2* 24.9* 25.0*  MCHC  29.3* 29.4* 29.7*  RDW 20.2* 19.9* 19.9*  PLT 178 169 200    Radiology/Studies:  DG Abd 1 View  Result Date: 02/03/2020 CLINICAL DATA:  Vomiting EXAM: ABDOMEN - 1 VIEW COMPARISON:  02/26/2019 FINDINGS: The bowel gas pattern is normal. Multiple calcified gallstones. Degenerative changes of the bilateral hips. IMPRESSION: 1. Nonobstructive bowel gas pattern. 2. Cholelithiasis. Electronically Signed   By: Davina Poke D.O.   On: 02/03/2020 12:33   IR Fluoro Guide CV Line Right  Result Date: 02/05/2020 INDICATION: 80 year old male referred for  temporary hemodialysis catheter EXAM: IMAGE GUIDED TEMPORARY HEMODIALYSIS CATHETER MEDICATIONS: None. ANESTHESIA/SEDATION: None FLUOROSCOPY TIME:  Fluoroscopy Time: 0 minutes 6 seconds (1 mGy). COMPLICATIONS: None PROCEDURE: Informed written consent was obtained from the patient's family after a discussion of the risks, benefits, and alternatives to treatment. Questions regarding the procedure were encouraged and answered. The right neck was prepped with chlorhexidine in a sterile fashion, and a sterile drape was applied covering the operative field. Maximum barrier sterile technique with sterile gowns and gloves were used for the procedure. A timeout was performed prior to the initiation of the procedure. A micropuncture kit was utilized to access the right internal jugular vein under direct, real-time ultrasound guidance after the overlying soft tissues were anesthetized with 1% lidocaine with epinephrine. Ultrasound image documentation was performed. The microwire was kinked to measure appropriate catheter length. A stiff glidewire was advanced to the level of the IVC. A 19 cm hemodialysis catheter was then placed over the wire. Final catheter positioning was confirmed and documented with a spot radiographic image. The catheter aspirates and flushes normally. The catheter was flushed with appropriate volume heparin dwells. Dressings were applied. The patient tolerated the procedure well without immediate post procedural complication. . IMPRESSION: Status post image guided right IJ temporary hemodialysis catheter. Catheter ready for use. Signed, Dulcy Fanny. Dellia Nims, RPVI [facility] Rad[facility] Electronically Signed   By: Corrie Mckusick D.O.   On: 02/05/2020 12:43   IR US Guide Vasc Access Right  Result Date: 02/05/2020 INDICATION: 80 year old male referred for temporary hemodialysis catheter EXAM: IMAGE GUIDED TEMPORARY HEMODIALYSIS CATHETER MEDICATIONS: None.  ANESTHESIA/SEDATION: None FLUOROSCOPY TIME:  Fluoroscopy Time: 0 minutes 6 seconds (1 mGy). COMPLICATIONS: None PROCEDURE: Informed written consent was obtained from the patient's family after a discussion of the risks, benefits, and alternatives to treatment. Questions regarding the procedure were encouraged and answered. The right neck was prepped with chlorhexidine in a sterile fashion, and a sterile drape was applied covering the operative field. Maximum barrier sterile technique with sterile gowns and gloves were used for the procedure. A timeout was performed prior to the initiation of the procedure. A micropuncture kit was utilized to access the right internal jugular vein under direct, real-time ultrasound guidance after the overlying soft tissues were anesthetized with 1% lidocaine with epinephrine. Ultrasound image documentation was performed. The microwire was kinked to measure appropriate catheter length. A stiff glidewire was advanced to the level of the IVC. A 19 cm hemodialysis catheter was then placed over the wire. Final catheter positioning was confirmed and documented with a spot radiographic image. The catheter aspirates and flushes normally. The catheter was flushed with appropriate volume heparin dwells. Dressings were applied. The patient tolerated the procedure well without immediate post procedural complication. . IMPRESSION: Status post image guided right IJ temporary hemodialysis catheter. Catheter ready for use. Signed, Dulcy Fanny. Dellia Nims, [place] [facility] Radiology Electronically Signed  By: Corrie Mckusick D.O.   On: 02/05/2020 12:43   ECHO TEE  Result Date: 02/06/2020    TRANSESOPHOGEAL ECHO REPORT   Patient Name:   Paul Price Date of Exam: 02/06/2020 Medical Rec #:  [id number]      Height:       66.0 in Accession #:    [id number]     Weight:       176.8 lb Date of Birth:  [birth year]       BSA:          1.898 m Patient Age:    53  years       BP:           131/44 mmHg Patient Gender: M              HR:           62 bpm. Exam Location:  Inpatient Procedure: Transesophageal Echo, Cardiac Doppler and Color Doppler Indications:     Bacteremia  History:         Patient has prior history of Echocardiogram examinations, most                  recent 01/08/2020. Arrythmias:Atrial Fibrillation,                  Signs/Symptoms:Bacteremia; Risk Factors:Hypertension and                  Dyslipidemia. Covid+, Staph Aureus.  Sonographer:     Dustin Flock Referring Phys:  [id number] Abigail Butts Diagnosing Phys: Skeet Latch MD PROCEDURE: The transesophogeal probe was passed without difficulty through the esophogus of the patient. Sedation performed by different physician. The patient was monitored while under deep sedation. Anesthestetic sedation was provided intravenously by Anesthesiology: 72.71m of Propofol. Image quality was good. The patient's vital signs; including heart rate, blood pressure, and oxygen saturation; remained stable throughout the procedure. The patient developed no complications during the procedure. IMPRESSIONS  1. Global hypokinesis worse in the septum. Left ventricular ejection fraction, by estimation, is 40 to 45%. The left ventricle has mildly decreased function. The left ventricle demonstrates global hypokinesis. Left ventricular diastolic function could not be evaluated.  2. Right ventricular systolic function is mildly reduced. The right ventricular size is normal.  3. Thrombus in the left atrial appendage measuring 1.5 x 0.6 cm. Left atrial size was mildly dilated. A left atrial/left atrial appendage thrombus was detected. The LAA emptying velocity was 47 cm/s.  4. Pacemaker wire.  5. The mitral valve is normal in structure. Mild mitral valve regurgitation. No evidence of mitral stenosis.  6. Tricuspid valve regurgitation is mild to moderate.  7. The aortic valve is tricuspid. Aortic valve regurgitation is not  visualized. No aortic stenosis is present.  8. There is mild (Grade II) atheroma plaque involving the descending aorta.  9. The inferior vena cava is normal in size with greater than 50% respiratory variability, suggesting right atrial pressure of 3 mmHg. 10. Evidence of atrial level shunting detected by color flow Doppler. There is a small patent foramen ovale with predominantly left to right shunting across the atrial septum. Conclusion(s)/Recommendation(s): Normal biventricular function without evidence of hemodynamically significant valvular heart disease. FINDINGS  Left Ventricle: Global hypokinesis worse in the septum. Left ventricular ejection fraction, by estimation, is 40 to 45%. The left ventricle has mildly decreased function. The left ventricle demonstrates global hypokinesis. The left ventricular internal cavity size was normal in size. There is no  left ventricular hypertrophy. Left ventricular diastolic function could not be evaluated. Right Ventricle: The right ventricular size is normal. No increase in right ventricular wall thickness. Right ventricular systolic function is mildly reduced. Left Atrium: Thrombus in the left atrial appendage measuring 1.5 x 0.6 cm. Left atrial size was mildly dilated. A left atrial/left atrial appendage thrombus was detected. The LAA emptying velocity was 47 cm/s. Right Atrium: Pacemaker wire. Right atrial size was normal in size. Pericardium: There is no evidence of pericardial effusion. Mitral Valve: The mitral valve is normal in structure. Mild mitral valve regurgitation. No evidence of mitral valve stenosis. Tricuspid Valve: The tricuspid valve is normal in structure. Tricuspid valve regurgitation is mild to moderate. No evidence of tricuspid stenosis. Aortic Valve: The aortic valve is tricuspid. Aortic valve regurgitation is not visualized. No aortic stenosis is present. Pulmonic Valve: The pulmonic valve was normal in structure. Pulmonic valve regurgitation is  trivial. No evidence of pulmonic stenosis. Aorta: The aortic root is normal in size and structure. There is mild (Grade II) atheroma plaque involving the descending aorta. Venous: The inferior vena cava is normal in size with greater than 50% respiratory variability, suggesting right atrial pressure of 3 mmHg. IAS/Shunts: Evidence of atrial level shunting detected by color flow Doppler. A small patent foramen ovale is detected with predominantly left to right shunting across the atrial septum. Additional Comments: A pacer wire is visualized.  TRICUSPID VALVE TR Peak grad:   47.6 mmHg TR Vmax:        345.00 cm/s Skeet Latch MD Electronically signed by Skeet Latch MD Signature Date/Time: 02/06/2020/2:13:57 PM    Final      Assessment and Plan:   1. Chronic anticoagulation 2. LA thrombus 3. Paroxysmal atrial fibrillation Patient on anticoagulation with Xarelto due to history of atrial fibrillation.  He has prior history of GI bleed and symptomatic anemia requiring transfusion.  Most recently admitted January 11 with symptomatic anemia and his Xarelto was hold with plan to restart as outpatient 1/21 but he never started. His anticoagulation held this admission secondary to low hemoglobin.  Now finding of LA thrombus.  CHA2DS2-VASc 2 score of 5. V paced rhythm.  -He need anticoagulation.  Agree with plan of IV heparin (if okay from GI prospective, no bolus) to see if he has recurrent bleed or not and then transition to Eliquis. - Continue BB  2.  Chronic systolic heart failure -LV function stable at 40 to 45% -Appears euvolemic -Continue Toprol XL 25 mg daily -No ACE or valve secondary to renal function  3.  CKD stage V with history of renal cell carcinoma s/p right nephrectomy -Patient has nodules on left kidney on recent scan -Followed by nephrology and plan for hemodialysis soon, likely later today   4.  Symptomatic anemia - Multifactorial  -Per primary team  5.  UTI staph  aureus -On antibiotic -Followed by ID  6. Recent COVID 19 - Treated with remdesivir and steroid on last admission -Per ID okay to discontinue isolation  Risk Assessment/Risk Scores:    New York Heart Association (NYHA) Functional Class NYHA Class II  CHA2DS2-VASc Score = 5  This indicates a 7.2% annual risk of stroke. The patient's score is based upon: CHF History: Yes HTN History: Yes Diabetes History: No Stroke History: No Vascular Disease History: Yes Age Score: 2 Gender Score: 0    For questions or updates, please contact [place] Please consult [email or web address] for contact info under    Signed, [employer],  PA  02/06/2020 3:54 PM   I have personally seen and examined this patient. I agree with the assessment and plan as outlined above.  80 yo male with history of CAD s/p CABG, PAF, second degree AV block s/p PPM, chronic systolic CHF, CKD stage 4, OSA, HLD, HTN, GI bleeding and anemia on chronic anticoagulation. He has been on Xarelto for PAF. He was admitted 01/20/20-01/26/20 with GI bleeding and Covid 19 infection treated with Remdesivir and steroids. He is fully vaccinated but has not received a booster. He was hydrated and transfused for anemia. His GI bleeding was felt to be due to gastritis. Prior capsule endoscopy without source. His Xarelto was not restarted. He was readmitted 02/01/20 with poor po intake, nausea and found to have worsened renal function. He was treated for a UTI during that admission. TEE today with LA thrombus but no endocarditis.Plans for HD to start today.   EKG from 02/02/20 with v pacing, atrial fibrillation.  Hgb stable today at 7.8  My exam: General: Well developed, well nourished, NAD  HEENT: OP clear, mucus membranes moist  SKIN: warm, dry. No rashes. Neuro: No focal deficits  Musculoskeletal: Muscle strength 5/5 all ext  Psychiatric: Mood and affect normal  Neck: No JVD Lungs:Clear bilaterally, no wheezes, rhonci,  crackles Cardiovascular: Irregular.  Abdomen:Soft. Bowel sounds present.  Extremities: No lower extremity edema.   Plan: Given PAF and now LA thrombus, he is at high risk for embolic event. Will start IV heparin and follow H/H closely. If he does not have further GI bleeding, will need to be discharged on a DOAC. Eliquis may be a better option for him. No signs of CHF. We will follow with you.   Lauree Chandler 02/06/2020 5:14 PM

## 2020-02-06 NOTE — Anesthesia Postprocedure Evaluation (Signed)
Anesthesia Post Note  Patient: Paul Price  Procedure(s) Performed: TRANSESOPHAGEAL ECHOCARDIOGRAM (TEE) (N/A )     Patient location during evaluation: Endoscopy Anesthesia Type: MAC Level of consciousness: awake and alert Pain management: pain level controlled Vital Signs Assessment: post-procedure vital signs reviewed and stable Respiratory status: spontaneous breathing, nonlabored ventilation, respiratory function stable and patient connected to nasal cannula oxygen Cardiovascular status: stable and blood pressure returned to baseline Postop Assessment: no apparent nausea or vomiting Anesthetic complications: no   No complications documented.  Last Vitals:  Vitals:   02/06/20 1340 02/06/20 1350  BP: (!) 131/44 (!) 135/46  Pulse: 62 60  Resp: (!) 22 18  Temp:    SpO2: 99% 96%    Last Pain:  Vitals:   02/06/20 1350  TempSrc:   PainSc: 0-No pain                 Blaze Nylund,W. EDMOND

## 2020-02-06 NOTE — Progress Notes (Signed)
[facility] for Infectious Disease    Date of Admission:  02/01/2020   Total days of antibiotics 5          ID: Paul Price is a 80 y.o. male with MSSA uti in setting of AKI Principal Problem:   AKI (acute kidney injury) ([place]) Active Problems:   HTN (hypertension)   Hyperlipidemia with target LDL less than 70   OSA on CPAP   PAF (paroxysmal atrial fibrillation) ([facility])   COVID-19 virus infection   Intractable nausea and vomiting   Hypernatremia   Elevated troponin    Subjective: Awaiting to get TEE. Afebrile  Addendum= TEE negative for vegetations but did have LA thrombus  Medications:  . amLODipine  2.5 mg Oral Daily  . atorvastatin  20 mg Oral Daily  . Chlorhexidine Gluconate Cloth  6 each Topical Q0600  . insulin aspart  0-6 Units Subcutaneous TID WC  . metoprolol succinate  25 mg Oral Daily  . pantoprazole  40 mg Oral BID  . sodium bicarbonate  650 mg Oral TID  . tamsulosin  0.4 mg Oral Daily    Objective: Vital signs in last 24 hours: Temp:  [97.8 F (36.6 C)-98.1 F (36.7 C)] 98 F (36.7 C) (01/28 1415) Pulse Rate:  [59-65] 61 (01/28 1415) Resp:  [17-30] 20 (01/28 1415) BP: (114-146)/(37-56) 138/53 (01/28 1415) SpO2:  [95 %-99 %] 96 % (01/28 1415) Physical Exam  Constitutional: He is oriented to person, place, and time. He appears well-developed and well-nourished. No distress.  HENT:  Mouth/Throat: Oropharynx is clear and moist. No oropharyngeal exudate.  Cardiovascular: Normal rate, regular rhythm and normal heart sounds. Exam reveals no gallop and no friction rub.  No murmur heard.  Pulmonary/Chest: Effort normal and breath sounds normal. No respiratory distress. He has no wheezes.  Neck:right ij temp cath Neurological: He is alert and oriented to person, place, and time.  Skin: Skin is warm and dry. No rash noted. No erythema.  Psychiatric: He has a normal mood and affect. His behavior is normal.     Lab Results Recent Labs     02/05/20 0239 02/05/20 0834 02/06/20 0146 02/06/20 0805  WBC  --  10.7*  --  11.8*  HGB  --  8.0*  --  7.8*  HCT  --  27.2*  --  26.3*  NA 145  --  143  --   K 3.4*  --  4.0  --   CL 114*  --  113*  --   CO2 22  --  18*  --   BUN 52*  --  49*  --   CREATININE 4.44*  --  4.25*  --    Liver Panel Recent Labs    02/05/20 0239 02/06/20 0146  ALBUMIN 1.8* 1.7*    Microbiology: Urine cx MSSA Blood cx NGTD Studies/Results: IR Fluoro Guide CV Line Right  Result Date: 02/05/2020 INDICATION: 80 year old male referred for temporary hemodialysis catheter EXAM: IMAGE GUIDED TEMPORARY HEMODIALYSIS CATHETER MEDICATIONS: None. ANESTHESIA/SEDATION: None FLUOROSCOPY TIME:  Fluoroscopy Time: 0 minutes 6 seconds (1 mGy). COMPLICATIONS: None PROCEDURE: Informed written consent was obtained from the patient's family after a discussion of the risks, benefits, and alternatives to treatment. Questions regarding the procedure were encouraged and answered. The right neck was prepped with chlorhexidine in a sterile fashion, and a sterile drape was applied covering the operative field. Maximum barrier sterile technique with sterile gowns and gloves were used for the procedure. A timeout was  performed prior to the initiation of the procedure. A micropuncture kit was utilized to access the right internal jugular vein under direct, real-time ultrasound guidance after the overlying soft tissues were anesthetized with 1% lidocaine with epinephrine. Ultrasound image documentation was performed. The microwire was kinked to measure appropriate catheter length. A stiff glidewire was advanced to the level of the IVC. A 19 cm hemodialysis catheter was then placed over the wire. Final catheter positioning was confirmed and documented with a spot radiographic image. The catheter aspirates and flushes normally. The catheter was flushed with appropriate volume heparin dwells. Dressings were applied. The patient tolerated the  procedure well without immediate post procedural complication. . IMPRESSION: Status post image guided right IJ temporary hemodialysis catheter. Catheter ready for use. Signed, Dulcy Fanny. Dellia Nims, RPVI [facility] Electronically Signed   By: Corrie Mckusick D.O.   On: 02/05/2020 12:43   IR US Guide Vasc Access Right  Result Date: 02/05/2020 INDICATION: 80 year old male referred for temporary hemodialysis catheter EXAM: IMAGE GUIDED TEMPORARY HEMODIALYSIS CATHETER MEDICATIONS: None. ANESTHESIA/SEDATION: None FLUOROSCOPY TIME:  Fluoroscopy Time: 0 minutes 6 seconds (1 mGy). COMPLICATIONS: None PROCEDURE: Informed written consent was obtained from the patient's family after a discussion of the risks, benefits, and alternatives to treatment. Questions regarding the procedure were encouraged and answered. The right neck was prepped with chlorhexidine in a sterile fashion, and a sterile drape was applied covering the operative field. Maximum barrier sterile technique with sterile gowns and gloves were used for the procedure. A timeout was performed prior to the initiation of the procedure. A micropuncture kit was utilized to access the right internal jugular vein under direct, real-time ultrasound guidance after the overlying soft tissues were anesthetized with 1% lidocaine with epinephrine. Ultrasound image documentation was performed. The microwire was kinked to measure appropriate catheter length. A stiff glidewire was advanced to the level of the IVC. A 19 cm hemodialysis catheter was then placed over the wire. Final catheter positioning was confirmed and documented with a spot radiographic image. The catheter aspirates and flushes normally. The catheter was flushed with appropriate volume heparin dwells. Dressings were applied. The patient tolerated the procedure well without immediate post procedural complication. . IMPRESSION: Status post image guided  right IJ temporary hemodialysis catheter. Catheter ready for use. Signed, Dulcy Fanny. Dellia Nims, RPVI [facility] Electronically Signed   By: Corrie Mckusick D.O.   On: 02/05/2020 12:43   ECHO TEE  Result Date: 02/06/2020    TRANSESOPHOGEAL ECHO REPORT   Patient Name:   Paul Price Date of Exam: 02/06/2020 Medical Rec #:  [id number]      Height:       66.0 in Accession #:    [id number]     Weight:       176.8 lb Date of Birth:  1940-12-09       BSA:          1.898 m Patient Age:    89 years       BP:           131/44 mmHg Patient Gender: M              HR:           62 bpm. Exam Location:  Inpatient Procedure: Transesophageal Echo, Cardiac Doppler and Color Doppler Indications:     Bacteremia  History:         Patient has prior history of  Echocardiogram examinations, most                  recent 01/08/2020. Arrythmias:Atrial Fibrillation,                  Signs/Symptoms:Bacteremia; Risk Factors:Hypertension and                  Dyslipidemia. Covid+, Staph Aureus.  Sonographer:     Dustin Flock Referring Phys:  [id number] Abigail Butts Diagnosing Phys: Skeet Latch MD PROCEDURE: The transesophogeal probe was passed without difficulty through the esophogus of the patient. Sedation performed by different physician. The patient was monitored while under deep sedation. Anesthestetic sedation was provided intravenously by Anesthesiology: 72.18mg  of Propofol. Image quality was good. The patient's vital signs; including heart rate, blood pressure, and oxygen saturation; remained stable throughout the procedure. The patient developed no complications during the procedure. IMPRESSIONS  1. Global hypokinesis worse in the septum. Left ventricular ejection fraction, by estimation, is 40 to 45%. The left ventricle has mildly decreased function. The left ventricle demonstrates global hypokinesis. Left ventricular diastolic function could not be evaluated.  2.  Right ventricular systolic function is mildly reduced. The right ventricular size is normal.  3. Thrombus in the left atrial appendage measuring 1.5 x 0.6 cm. Left atrial size was mildly dilated. A left atrial/left atrial appendage thrombus was detected. The LAA emptying velocity was 47 cm/s.  4. Pacemaker wire.  5. The mitral valve is normal in structure. Mild mitral valve regurgitation. No evidence of mitral stenosis.  6. Tricuspid valve regurgitation is mild to moderate.  7. The aortic valve is tricuspid. Aortic valve regurgitation is not visualized. No aortic stenosis is present.  8. There is mild (Grade II) atheroma plaque involving the descending aorta.  9. The inferior vena cava is normal in size with greater than 50% respiratory variability, suggesting right atrial pressure of 3 mmHg. 10. Evidence of atrial level shunting detected by color flow Doppler. There is a small patent foramen ovale with predominantly left to right shunting across the atrial septum. Conclusion(s)/Recommendation(s): Normal biventricular function without evidence of hemodynamically significant valvular heart disease. FINDINGS  Left Ventricle: Global hypokinesis worse in the septum. Left ventricular ejection fraction, by estimation, is 40 to 45%. The left ventricle has mildly decreased function. The left ventricle demonstrates global hypokinesis. The left ventricular internal cavity size was normal in size. There is no left ventricular hypertrophy. Left ventricular diastolic function could not be evaluated. Right Ventricle: The right ventricular size is normal. No increase in right ventricular wall thickness. Right ventricular systolic function is mildly reduced. Left Atrium: Thrombus in the left atrial appendage measuring 1.5 x 0.6 cm. Left atrial size was mildly dilated. A left atrial/left atrial appendage thrombus was detected. The LAA emptying velocity was 47 cm/s. Right Atrium: Pacemaker wire. Right atrial size was normal in size.  Pericardium: There is no evidence of pericardial effusion. Mitral Valve: The mitral valve is normal in structure. Mild mitral valve regurgitation. No evidence of mitral valve stenosis. Tricuspid Valve: The tricuspid valve is normal in structure. Tricuspid valve regurgitation is mild to moderate. No evidence of tricuspid stenosis. Aortic Valve: The aortic valve is tricuspid. Aortic valve regurgitation is not visualized. No aortic stenosis is present. Pulmonic Valve: The pulmonic valve was normal in structure. Pulmonic valve regurgitation is trivial. No evidence of pulmonic stenosis. Aorta: The aortic root is normal in size and structure. There is mild (Grade II) atheroma plaque involving the descending aorta. Venous: The  inferior vena cava is normal in size with greater than 50% respiratory variability, suggesting right atrial pressure of 3 mmHg. IAS/Shunts: Evidence of atrial level shunting detected by color flow Doppler. A small patent foramen ovale is detected with predominantly left to right shunting across the atrial septum. Additional Comments: A pacer wire is visualized.  TRICUSPID VALVE TR Peak grad:   47.6 mmHg TR Vmax:        345.00 cm/s Skeet Latch MD Electronically signed by Skeet Latch MD Signature Date/Time: 02/06/2020/2:13:57 PM    Final      Assessment/Plan: MSSA UTI = disseminated infection ruled out with negative blood cx and no signs of endocarditis on TEE. Currently on day 5 of iv coverage. Can stop IV abtx tomorrow.  LA thrombus = to start aniticoagulation and watch for bleeding  AKI = continues to prepare for need for HD. Vascular surgery to do VA fistula soon.  Will sign off  [facility] for Infectious Diseases Cell: [phone] Pager: [phone]  02/06/2020, 3:07 PM

## 2020-02-06 NOTE — Care Management Important Message (Signed)
Important Message  Patient Details  Name: Paul Price MRN: [id number] Date of Birth: 1940/02/09   Medicare Important Message Given:  Yes - Important Message mailed due to current National Emergency   Verbal consent obtained due to current National Emergency  Relationship to patient: Self Contact Name: Stevens Call Date: 02/06/20  Time: 1410 Phone: [phone] Outcome: No Answer/Busy Important Message mailed to: Patient address on file    Delorse Lek 02/06/2020, 2:11 PM

## 2020-02-06 NOTE — Progress Notes (Signed)
[facility] Progress Note   Subjective:    TEE neg vegetation but thrombus noted.  No new complaints.  S/p temp HD catheter placement.  Out of covid isolation now.  Wife bedside.  Still poor appetite and feeling bad.   Objective Vitals:   02/06/20 1340 02/06/20 1350 02/06/20 1400 02/06/20 1415  BP: (!) 131/44 (!) 135/46 (!) 142/43 (!) 138/53  Pulse: 62 60 62 61  Resp: (!) _0 Temp:    98 F (36.7 C)  TempSrc:    Oral  SpO2: 99% 96% 96% 96%  Weight:      Height:       Physical Exam Gen: elderly man flat in bed - alert Eyes: anicteric, EOMI ENT: MM tacky Neck: supple, no JVD CV:  RRR, paced on monitor, no rub Back: clear lungs with normal WOB GU:  no foley Extr:  No edema Neuro: some tremors noted but no asterixis; hard of hearing  Additional Objective Labs: Basic Metabolic Panel: Recent Labs  Lab 02/04/20 0213 02/05/20 0239 02/06/20 0146  NA 146* 145 143  K 3.8 3.4* 4.0  CL 113* 114* 113*  CO2 20* 22 18*  GLUCOSE 116* 102* 109*  BUN 55* 52* 49*  CREATININE 4.58* 4.44* 4.25*  CALCIUM 7.8* 7.9* 7.7*  PHOS 3.0 3.2 2.9   Liver Function Tests: Recent Labs  Lab 02/01/20 1618 02/02/20 0055 02/03/20 0239 02/04/20 0213 02/05/20 0239 02/06/20 0146  AST 28 28  --   --   --   --   ALT 24 22  --   --   --   --   ALKPHOS 126 109  --   --   --   --   BILITOT 0.8 0.9  --   --   --   --   PROT 5.9* 5.3*  --   --   --   --   ALBUMIN 2.4* 2.2*   < > 1.8* 1.8* 1.7*   < > = values in this interval not displayed.   No results for input(s): LIPASE, AMYLASE in the last 168 hours. CBC: Recent Labs  Lab 02/01/20 1618 02/02/20 0055 02/03/20 0239 02/04/20 0213 02/05/20 0834 02/06/20 0805  WBC 16.8* 17.3* 16.2* 12.6* 10.7* 11.8*  NEUTROABS 14.5*  --   --   --   --   --   HGB 9.2* 8.5* 7.9* 7.5* 8.0* 7.8*  HCT 31.2* 29.7* 26.1* 25.6* 27.2* 26.3*  MCV 84.1 85.6 82.1 85.9 84.7 84.3  PLT 249 226 195 178 169 200   Blood Culture    Component  Value Date/Time   SDES BLOOD RIGHT ANTECUBITAL 02/04/2020 1646   SPECREQUEST  02/04/2020 1646    BOTTLES DRAWN AEROBIC ONLY Blood Culture adequate volume   CULT  02/04/2020 1646    NO GROWTH 2 DAYS Performed at [facility], [place] [address], [place], [place] [address]    REPTSTATUS PENDING 02/04/2020 1646    Cardiac Enzymes: No results for input(s): CKTOTAL, CKMB, CKMBINDEX, TROPONINI in the last 168 hours. CBG: Recent Labs  Lab 02/05/20 0742 02/05/20 1312 02/05/20 1707 02/05/20 2057 02/06/20 0822  GLUCAP 122* 151* 101* 126* 112*   Iron Studies: No results for input(s): IRON, TIBC, TRANSFERRIN, FERRITIN in the last 72 hours. _1 @ Studies/Results: IR Fluoro Guide CV Line Right  Result Date: 02/05/2020 INDICATION: 80 year old male referred for temporary hemodialysis catheter EXAM: IMAGE GUIDED TEMPORARY HEMODIALYSIS CATHETER MEDICATIONS: None. ANESTHESIA/SEDATION: None FLUOROSCOPY TIME:  Fluoroscopy  Time: 0 minutes 6 seconds (1 mGy). COMPLICATIONS: None PROCEDURE: Informed written consent was obtained from the patient's family after a discussion of the risks, benefits, and alternatives to treatment. Questions regarding the procedure were encouraged and answered. The right neck was prepped with chlorhexidine in a sterile fashion, and a sterile drape was applied covering the operative field. Maximum barrier sterile technique with sterile gowns and gloves were used for the procedure. A timeout was performed prior to the initiation of the procedure. A micropuncture kit was utilized to access the right internal jugular vein under direct, real-time ultrasound guidance after the overlying soft tissues were anesthetized with 1% lidocaine with epinephrine. Ultrasound image documentation was performed. The microwire was kinked to measure appropriate catheter length. A stiff glidewire was advanced to the level of the IVC. A 19 cm hemodialysis catheter was then placed over the wire. Final  catheter positioning was confirmed and documented with a spot radiographic image. The catheter aspirates and flushes normally. The catheter was flushed with appropriate volume heparin dwells. Dressings were applied. The patient tolerated the procedure well without immediate post procedural complication. . IMPRESSION: Status post image guided right IJ temporary hemodialysis catheter. Catheter ready for use. Signed, Jaime S. Wagner, DO, RPVI [facility] Electronically Signed   By: Jaime  Wagner D.O.   On: 02/05/2020 12:43   IR US Guide Vasc Access Right  Result Date: 02/05/2020 INDICATION: 80-year-old male referred for temporary hemodialysis catheter EXAM: IMAGE GUIDED TEMPORARY HEMODIALYSIS CATHETER MEDICATIONS: None. ANESTHESIA/SEDATION: None FLUOROSCOPY TIME:  Fluoroscopy Time: 0 minutes 6 seconds (1 mGy). COMPLICATIONS: None PROCEDURE: Informed written consent was obtained from the patient's family after a discussion of the risks, benefits, and alternatives to treatment. Questions regarding the procedure were encouraged and answered. The right neck was prepped with chlorhexidine in a sterile fashion, and a sterile drape was applied covering the operative field. Maximum barrier sterile technique with sterile gowns and gloves were used for the procedure. A timeout was performed prior to the initiation of the procedure. A micropuncture kit was utilized to access the right internal jugular vein under direct, real-time ultrasound guidance after the overlying soft tissues were anesthetized with 1% lidocaine with epinephrine. Ultrasound image documentation was performed. The microwire was kinked to measure appropriate catheter length. A stiff glidewire was advanced to the level of the IVC. A 19 cm hemodialysis catheter was then placed over the wire. Final catheter positioning was confirmed and documented with a spot radiographic image. The catheter aspirates  and flushes normally. The catheter was flushed with appropriate volume heparin dwells. Dressings were applied. The patient tolerated the procedure well without immediate post procedural complication. . IMPRESSION: Status post image guided right IJ temporary hemodialysis catheter. Catheter ready for use. Signed, Jaime S. Wagner, DO, RPVI [facility] Electronically Signed   By: Jaime  Wagner D.O.   On: 02/05/2020 12:43   ECHO TEE  Result Date: 02/06/2020    TRANSESOPHOGEAL ECHO REPORT   Patient Name:   Sergi E Reetz Date of Exam: 02/06/2020 Medical Rec #:  [id number]      Height:       66.0 in Accession #:    [id number]     Weight:       176.8 lb Date of Birth:  12/27/1940       BSA:          1.898 m Patient Age:    79 years         BP:           131/44 mmHg Patient Gender: M              HR:           62 bpm. Exam Location:  Inpatient Procedure: Transesophageal Echo, Cardiac Doppler and Color Doppler Indications:     Bacteremia  History:         Patient has prior history of Echocardiogram examinations, most                  recent 01/08/2020. Arrythmias:Atrial Fibrillation,                  Signs/Symptoms:Bacteremia; Risk Factors:Hypertension and                  Dyslipidemia. Covid+, Staph Aureus.  Sonographer:     Dustin Flock Referring Phys:  [id number] Abigail Butts Diagnosing Phys: Skeet Latch MD PROCEDURE: The transesophogeal probe was passed without difficulty through the esophogus of the patient. Sedation performed by different physician. The patient was monitored while under deep sedation. Anesthestetic sedation was provided intravenously by Anesthesiology: 72.45m of Propofol. Image quality was good. The patient's vital signs; including heart rate, blood pressure, and oxygen saturation; remained stable throughout the procedure. The patient developed no complications during the procedure. IMPRESSIONS  1. Global hypokinesis worse in the  septum. Left ventricular ejection fraction, by estimation, is 40 to 45%. The left ventricle has mildly decreased function. The left ventricle demonstrates global hypokinesis. Left ventricular diastolic function could not be evaluated.  2. Right ventricular systolic function is mildly reduced. The right ventricular size is normal.  3. Thrombus in the left atrial appendage measuring 1.5 x 0.6 cm. Left atrial size was mildly dilated. A left atrial/left atrial appendage thrombus was detected. The LAA emptying velocity was 47 cm/s.  4. Pacemaker wire.  5. The mitral valve is normal in structure. Mild mitral valve regurgitation. No evidence of mitral stenosis.  6. Tricuspid valve regurgitation is mild to moderate.  7. The aortic valve is tricuspid. Aortic valve regurgitation is not visualized. No aortic stenosis is present.  8. There is mild (Grade II) atheroma plaque involving the descending aorta.  9. The inferior vena cava is normal in size with greater than 50% respiratory variability, suggesting right atrial pressure of 3 mmHg. 10. Evidence of atrial level shunting detected by color flow Doppler. There is a small patent foramen ovale with predominantly left to right shunting across the atrial septum. Conclusion(s)/Recommendation(s): Normal biventricular function without evidence of hemodynamically significant valvular heart disease. FINDINGS  Left Ventricle: Global hypokinesis worse in the septum. Left ventricular ejection fraction, by estimation, is 40 to 45%. The left ventricle has mildly decreased function. The left ventricle demonstrates global hypokinesis. The left ventricular internal cavity size was normal in size. There is no left ventricular hypertrophy. Left ventricular diastolic function could not be evaluated. Right Ventricle: The right ventricular size is normal. No increase in right ventricular wall thickness. Right ventricular systolic function is mildly reduced. Left Atrium: Thrombus in the left  atrial appendage measuring 1.5 x 0.6 cm. Left atrial size was mildly dilated. A left atrial/left atrial appendage thrombus was detected. The LAA emptying velocity was 47 cm/s. Right Atrium: Pacemaker wire. Right atrial size was normal in size. Pericardium: There is no evidence of pericardial effusion. Mitral Valve: The mitral valve is normal in structure. Mild mitral valve regurgitation. No evidence of mitral valve stenosis. Tricuspid Valve: The tricuspid valve is  normal in structure. Tricuspid valve regurgitation is mild to moderate. No evidence of tricuspid stenosis. Aortic Valve: The aortic valve is tricuspid. Aortic valve regurgitation is not visualized. No aortic stenosis is present. Pulmonic Valve: The pulmonic valve was normal in structure. Pulmonic valve regurgitation is trivial. No evidence of pulmonic stenosis. Aorta: The aortic root is normal in size and structure. There is mild (Grade II) atheroma plaque involving the descending aorta. Venous: The inferior vena cava is normal in size with greater than 50% respiratory variability, suggesting right atrial pressure of 3 mmHg. IAS/Shunts: Evidence of atrial level shunting detected by color flow Doppler. A small patent foramen ovale is detected with predominantly left to right shunting across the atrial septum. Additional Comments: A pacer wire is visualized.  TRICUSPID VALVE TR Peak grad:   47.6 mmHg TR Vmax:        345.00 cm/s Skeet Latch MD Electronically signed by Skeet Latch MD Signature Date/Time: 02/06/2020/2:13:57 PM    Final    Medications: . sodium chloride    . sodium chloride    . sodium chloride 10 mL/hr at 02/06/20 1126  .  ceFAZolin (ANCEF) IV 2 g (02/06/20 0957)  . dextrose 5 % and 0.45% NaCl 100 mL/hr at 02/06/20 1740  . ferumoxytol     . amLODipine  2.5 mg Oral Daily  . atorvastatin  20 mg Oral Daily  . Chlorhexidine Gluconate Cloth  6 each Topical Q0600  . insulin aspart  0-6 Units Subcutaneous TID WC  . metoprolol  succinate  25 mg Oral Daily  . pantoprazole  40 mg Oral BID  . sodium bicarbonate  650 mg Oral TID  . tamsulosin  0.4 mg Oral Daily   Assessment/Plan **AKI on CKD 5:  Initially thought to be prerenal but hasn't improved with volume resuscitation.  eGFR 12 is likely overestimation with advanced age and low muscle mass; 24h urine has been logistically impossible.  Perhaps the underlying issue is uremia leading to nausea and we have decided to proceed with trial of dialysis.  In light of concern for bacteremia IR placed RIJ temp cath.  VVS available for perm access once infectious issues resolved.  HD #1 1/28.  If he doesn't feel better with HD we may elect to hold off on long term dialysis for now.   **Staph UTI/ concern for bacteremia:  ID following.  On cefazolin.  TEE neg.  Blood cultures NGTD.  Plans to stop abx tomorrow after 5d coverage for UTI.  **Nausea/emesis/anorexia:  Abd X ray unrevealing.  Persistent.  Trial of dialysis as this may be a uremic symptom.  **Anemia:  On po iron, trend H/H; no indication for transfusion currently but into 7s now.  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  **recent COVID:  S/p steroids and remdesivir last admission; CXR improved, ok on RA.   Off precautions per [facility] policy.   **A fib:  Paced currently, xarelto on hold for now -- would consider coumadin or eliquis if resumed this admission given eGFR < 15. Now with cardiac thrombus cardiology determining anticoag.   **Renal mass: noted on [place] last admission, plans for outpt urology f/u.  I discussed with pt and wife 1/27 - they were unaware of this.   Jannifer Hick MD 02/06/2020, 4:24 PM  [facility] Pager: [phone]

## 2020-02-06 NOTE — Progress Notes (Signed)
PROGRESS NOTE    Paul Price  ETK:[id number] DOB: 09/04/1940 DOA: 02/01/2020 PCP: [name], MD   Brief Narrative: 80 year old with past medical history significant for paroxysmal A. fib, chronic kidney disease, hypertension, hyperlipidemia, BPH who presented to the ED complaining of shortness of breath and nausea.  Patient was recently discharged on 01/26/2020 after admission for upper GI bleed, AKI and Covid pneumonia.  Patient finished a course of breath then severe and is steroids.  He did not have oxygen requirement at that time.  Patient was evaluated by GI at that time and advised to hold Xarelto until 01/31/2020.  Patient did not resume Xarelto at home.  Patient was discharged with a creatinine of 3.7.  He presents with nausea vomiting, unable to keep anything down.  Patient admitted with poor oral intake, dehydration, hyponatremia, nausea and AKI.    Assessment & Plan:   Principal Problem:   AKI (acute kidney injury) ([place]) Active Problems:   HTN (hypertension)   Hyperlipidemia with target LDL less than 70   OSA on CPAP   PAF (paroxysmal atrial fibrillation) (HCC)   COVID-19 virus infection   Intractable nausea and vomiting   Hypernatremia   Elevated troponin  1-AKI on CKD stage IV: Recent creatinine at discharge was 3.7.  Presented with a creatinine of 4.5 Continue to hold Lasix. Appreciate nephrology evaluation.  Poor oral intake. Cr at 4.4.  Nephrology planning trial of HD> for HD catheter placement 1/27. VA fistula time to be determine by Vascular.   2-Staph Aureus in urine.  UTI: urine culture grew  Staph aureus.  Continue with cefazolin. TEE negative for endocarditis.  Appreciate ID recommendations.  Blood culture obtain 1/26: No growth to date,.   Hyponatremia: Related to hypovolemia poor oral intake. Resolved With IV fluids.  Hypernatremia;  On IV fluids.  Improved.   3-Hypokalemia: Resolved.   4-A. Fib: Per GI from last admission ok to resume   Xarelto 1/22. We were holding xarelto because hb was trending down.  TEE showed, Left Atrium appendage thrombus.  Hb has remain stable 7--8. He denies black stool. Last episode was one week ago.  Discussed option for heparin Gtt and then transition to eliquis. Patient would like opinion from his cardiologist.   HFrEF;  Echo 2021 ejection fraction 40 to 45% Monitor volume status Continue to hold IV Lasix  Nausea and vomiting; He denies abdominal pain. KUB negative for bowel obstruction Supportive care  Elevated troponin: Most likely demand ischemia and worsening renal function. Denies chest pain  Generalized weakness: PT eval  Hypertension: Continue with Norvasc and Metoprolol  Covid positive, tested 01/20/2020 Completed a course of rhythm severe and IV steroid Per Dr Baxter Flattery, ok to discontinue isolation.      Estimated body mass index is 28.54 kg/m as calculated from the following:   Height as of this encounter: 5' 6"  (1.676 m).   Weight as of this encounter: 80.2 kg.   DVT prophylaxis: Scd Code Status: Full code Family Communication: Cra discussed with Wife who was at bedside.  Disposition Plan:  Status is: Inpatient  Remains inpatient appropriate because:Persistent severe electrolyte disturbances   Dispo: The patient is from: Home              Anticipated d/c is to: Home              Anticipated d/c date is: 3 days              Patient currently is not medically  stable to d/c.   Difficult to place patient No        Consultants:   Nephrology,.   Procedures:  none Antimicrobials:  Ceftriaxone   Subjective: He is alert denies dyspnea.  He report last episode of black stool was 1 week ago. His stool today was normal color.     Objective: Vitals:   02/06/20 1340 02/06/20 1350 02/06/20 1400 02/06/20 1415  BP: (!) 131/44 (!) 135/46 (!) 142/43 (!) 138/53  Pulse: 62 60 62 61  Resp: (!) 22 18 20 20   Temp:    98 F (36.7 C)  TempSrc:    Oral   SpO2: 99% 96% 96% 96%  Weight:      Height:        Intake/Output Summary (Last 24 hours) at 02/06/2020 1456 Last data filed at 02/06/2020 1315 Gross per 24 hour  Intake 650 ml  Output 300 ml  Net 350 ml   Filed Weights   02/02/20 1952  Weight: 80.2 kg    Examination:  General exam: NAD Respiratory system: CTA Cardiovascular system: S 1, S 2 RRR Gastrointestinal system: BS present, soft, nt Central nervous system: Alert oriented Extremities: No edema    Data Reviewed: I have personally reviewed following labs and imaging studies  CBC: Recent Labs  Lab 02/01/20 1618 02/02/20 0055 02/03/20 0239 02/04/20 0213 02/05/20 0834 02/06/20 0805  WBC 16.8* 17.3* 16.2* 12.6* 10.7* 11.8*  NEUTROABS 14.5*  --   --   --   --   --   HGB 9.2* 8.5* 7.9* 7.5* 8.0* 7.8*  HCT 31.2* 29.7* 26.1* 25.6* 27.2* 26.3*  MCV 84.1 85.6 82.1 85.9 84.7 84.3  PLT 249 226 195 178 169 242   Basic Metabolic Panel: Recent Labs  Lab 02/02/20 0055 02/03/20 0239 02/03/20 1450 02/04/20 0213 02/05/20 0239 02/06/20 0146  NA 150* 145 147* 146* 145 143  K 3.0* 2.9* 3.3* 3.8 3.4* 4.0  CL 117* 112* 113* 113* 114* 113*  CO2 23 23 22  20* 22 18*  GLUCOSE 155* 157* 143* 116* 102* 109*  BUN 65* 58* 58* 55* 52* 49*  CREATININE 4.55* 4.47* 4.56* 4.58* 4.44* 4.25*  CALCIUM 8.0* 7.8* 7.8* 7.8* 7.9* 7.7*  MG 2.1  --   --   --   --   --   PHOS  --  2.5 2.6 3.0 3.2 2.9   GFR: Estimated Creatinine Clearance: 13.6 mL/min (A) (by C-G formula based on SCr of 4.25 mg/dL (H)). Liver Function Tests: Recent Labs  Lab 02/01/20 1618 02/02/20 0055 02/03/20 0239 02/03/20 1450 02/04/20 0213 02/05/20 0239 02/06/20 0146  AST 28 28  --   --   --   --   --   ALT 24 22  --   --   --   --   --   ALKPHOS 126 109  --   --   --   --   --   BILITOT 0.8 0.9  --   --   --   --   --   PROT 5.9* 5.3*  --   --   --   --   --   ALBUMIN 2.4* 2.2* 1.9* 2.0* 1.8* 1.8* 1.7*   No results for input(s): LIPASE, AMYLASE in the  last 168 hours. No results for input(s): AMMONIA in the last 168 hours. Coagulation Profile: No results for input(s): INR, PROTIME in the last 168 hours. Cardiac Enzymes: No results for input(s): CKTOTAL, CKMB, CKMBINDEX, TROPONINI in  the last 168 hours. BNP (last 3 results) No results for input(s): PROBNP in the last 8760 hours. HbA1C: Recent Labs    02/04/20 0213  HGBA1C 6.1*   CBG: Recent Labs  Lab 02/05/20 0742 02/05/20 1312 02/05/20 1707 02/05/20 2057 02/06/20 0822  GLUCAP 122* 151* 101* 126* 112*   Lipid Profile: No results for input(s): CHOL, HDL, LDLCALC, TRIG, CHOLHDL, LDLDIRECT in the last 72 hours. Thyroid Function Tests: No results for input(s): TSH, T4TOTAL, FREET4, T3FREE, THYROIDAB in the last 72 hours. Anemia Panel: No results for input(s): VITAMINB12, FOLATE, FERRITIN, TIBC, IRON, RETICCTPCT in the last 72 hours. Sepsis Labs: Recent Labs  Lab 02/02/20 0055 02/03/20 0239  PROCALCITON 0.36 0.45    Recent Results (from the past 240 hour(s))  Urine culture     Status: Abnormal   Collection Time: 02/01/20  5:13 PM   Specimen: Urine, Random  Result Value Ref Range Status   Specimen Description URINE, RANDOM  Final   Special Requests   Final    NONE Performed at [facility], [address], [place], [place] [address]    Culture >=100,000 COLONIES/mL STAPHYLOCOCCUS AUREUS (A)  Final   Report Status 02/05/2020 FINAL  Final   Organism ID, Bacteria STAPHYLOCOCCUS AUREUS (A)  Final      Susceptibility   Staphylococcus aureus - MIC*    CIPROFLOXACIN <=0.5 SENSITIVE Sensitive     GENTAMICIN <=0.5 SENSITIVE Sensitive     NITROFURANTOIN <=16 SENSITIVE Sensitive     OXACILLIN <=0.25 SENSITIVE Sensitive     TETRACYCLINE <=1 SENSITIVE Sensitive     VANCOMYCIN <=0.5 SENSITIVE Sensitive     TRIMETH/SULFA <=10 SENSITIVE Sensitive     CLINDAMYCIN <=0.25 SENSITIVE Sensitive     RIFAMPIN <=0.5 SENSITIVE Sensitive     Inducible Clindamycin NEGATIVE  Sensitive     * >=100,000 COLONIES/mL STAPHYLOCOCCUS AUREUS  Culture, blood (Routine X 2) w Reflex to ID Panel     Status: None (Preliminary result)   Collection Time: 02/04/20  4:40 PM   Specimen: BLOOD  Result Value Ref Range Status   Specimen Description BLOOD RIGHT ANTECUBITAL  Final   Special Requests   Final    BOTTLES DRAWN AEROBIC ONLY Blood Culture adequate volume   Culture   Final    NO GROWTH 2 DAYS Performed at [facility], [address] [place], [place] [address]    Report Status PENDING  Incomplete  Culture, blood (Routine X 2) w Reflex to ID Panel     Status: None (Preliminary result)   Collection Time: 02/04/20  4:46 PM   Specimen: BLOOD  Result Value Ref Range Status   Specimen Description BLOOD RIGHT ANTECUBITAL  Final   Special Requests   Final    BOTTLES DRAWN AEROBIC ONLY Blood Culture adequate volume   Culture   Final    NO GROWTH 2 DAYS Performed at [facility], [place] [address], [place], [place] [address]    Report Status PENDING  Incomplete         Radiology Studies: IR Fluoro Guide CV Line Right  Result Date: 02/05/2020 INDICATION: 80 year old male referred for temporary hemodialysis catheter EXAM: IMAGE GUIDED TEMPORARY HEMODIALYSIS CATHETER MEDICATIONS: None. ANESTHESIA/SEDATION: None FLUOROSCOPY TIME:  Fluoroscopy Time: 0 minutes 6 seconds (1 mGy). COMPLICATIONS: None PROCEDURE: Informed written consent was obtained from the patient's family after a discussion of the risks, benefits, and alternatives to treatment. Questions regarding the procedure were encouraged and answered. The right neck was  prepped with chlorhexidine in a sterile fashion, and a sterile drape was applied covering the operative field. Maximum barrier sterile technique with sterile gowns and gloves were used for the procedure. A timeout was performed prior to the initiation of the procedure. A micropuncture kit was utilized to access the right internal jugular vein under  direct, real-time ultrasound guidance after the overlying soft tissues were anesthetized with 1% lidocaine with epinephrine. Ultrasound image documentation was performed. The microwire was kinked to measure appropriate catheter length. A stiff glidewire was advanced to the level of the IVC. A 19 cm hemodialysis catheter was then placed over the wire. Final catheter positioning was confirmed and documented with a spot radiographic image. The catheter aspirates and flushes normally. The catheter was flushed with appropriate volume heparin dwells. Dressings were applied. The patient tolerated the procedure well without immediate post procedural complication. . IMPRESSION: Status post image guided right IJ temporary hemodialysis catheter. Catheter ready for use. Signed, Dulcy Fanny. Dellia Nims, RPVI [facility] Radiology Electronically Signed   By: Corrie Mckusick D.O.   On: 02/05/2020 12:43   IR US Guide Vasc Access Right  Result Date: 02/05/2020 INDICATION: 80 year old male referred for temporary hemodialysis catheter EXAM: IMAGE GUIDED TEMPORARY HEMODIALYSIS CATHETER MEDICATIONS: None. ANESTHESIA/SEDATION: None FLUOROSCOPY TIME:  Fluoroscopy Time: 0 minutes 6 seconds (1 mGy). COMPLICATIONS: None PROCEDURE: Informed written consent was obtained from the patient's family after a discussion of the risks, benefits, and alternatives to treatment. Questions regarding the procedure were encouraged and answered. The right neck was prepped with chlorhexidine in a sterile fashion, and a sterile drape was applied covering the operative field. Maximum barrier sterile technique with sterile gowns and gloves were used for the procedure. A timeout was performed prior to the initiation of the procedure. A micropuncture kit was utilized to access the right internal jugular vein under direct, real-time ultrasound guidance after the overlying soft tissues were anesthetized with 1%  lidocaine with epinephrine. Ultrasound image documentation was performed. The microwire was kinked to measure appropriate catheter length. A stiff glidewire was advanced to the level of the IVC. A 19 cm hemodialysis catheter was then placed over the wire. Final catheter positioning was confirmed and documented with a spot radiographic image. The catheter aspirates and flushes normally. The catheter was flushed with appropriate volume heparin dwells. Dressings were applied. The patient tolerated the procedure well without immediate post procedural complication. . IMPRESSION: Status post image guided right IJ temporary hemodialysis catheter. Catheter ready for use. Signed, Dulcy Fanny. Dellia Nims, RPVI [facility] Radiology Electronically Signed   By: Corrie Mckusick D.O.   On: 02/05/2020 12:43   ECHO TEE  Result Date: 02/06/2020    TRANSESOPHOGEAL ECHO REPORT   Patient Name:   Paul Price Date of Exam: 02/06/2020 Medical Rec #:  [id number]      Height:       66.0 in Accession #:    [id number]     Weight:       176.8 lb Date of Birth:  1940-01-17       BSA:          1.898 m Patient Age:    49 years       BP:           131/44 mmHg Patient Gender: M              HR:           62  bpm. Exam Location:  Inpatient Procedure: Transesophageal Echo, Cardiac Doppler and Color Doppler Indications:     Bacteremia  History:         Patient has prior history of Echocardiogram examinations, most                  recent 01/08/2020. Arrythmias:Atrial Fibrillation,                  Signs/Symptoms:Bacteremia; Risk Factors:Hypertension and                  Dyslipidemia. Covid+, Staph Aureus.  Sonographer:     Dustin Flock Referring Phys:  [id number] Abigail Butts Diagnosing Phys: Skeet Latch MD PROCEDURE: The transesophogeal probe was passed without difficulty through the esophogus of the patient. Sedation performed by different physician. The patient was monitored while under deep  sedation. Anesthestetic sedation was provided intravenously by Anesthesiology: 72.14m of Propofol. Image quality was good. The patient's vital signs; including heart rate, blood pressure, and oxygen saturation; remained stable throughout the procedure. The patient developed no complications during the procedure. IMPRESSIONS  1. Global hypokinesis worse in the septum. Left ventricular ejection fraction, by estimation, is 40 to 45%. The left ventricle has mildly decreased function. The left ventricle demonstrates global hypokinesis. Left ventricular diastolic function could not be evaluated.  2. Right ventricular systolic function is mildly reduced. The right ventricular size is normal.  3. Thrombus in the left atrial appendage measuring 1.5 x 0.6 cm. Left atrial size was mildly dilated. A left atrial/left atrial appendage thrombus was detected. The LAA emptying velocity was 47 cm/s.  4. Pacemaker wire.  5. The mitral valve is normal in structure. Mild mitral valve regurgitation. No evidence of mitral stenosis.  6. Tricuspid valve regurgitation is mild to moderate.  7. The aortic valve is tricuspid. Aortic valve regurgitation is not visualized. No aortic stenosis is present.  8. There is mild (Grade II) atheroma plaque involving the descending aorta.  9. The inferior vena cava is normal in size with greater than 50% respiratory variability, suggesting right atrial pressure of 3 mmHg. 10. Evidence of atrial level shunting detected by color flow Doppler. There is a small patent foramen ovale with predominantly left to right shunting across the atrial septum. Conclusion(s)/Recommendation(s): Normal biventricular function without evidence of hemodynamically significant valvular heart disease. FINDINGS  Left Ventricle: Global hypokinesis worse in the septum. Left ventricular ejection fraction, by estimation, is 40 to 45%. The left ventricle has mildly decreased function. The left ventricle demonstrates global hypokinesis.  The left ventricular internal cavity size was normal in size. There is no left ventricular hypertrophy. Left ventricular diastolic function could not be evaluated. Right Ventricle: The right ventricular size is normal. No increase in right ventricular wall thickness. Right ventricular systolic function is mildly reduced. Left Atrium: Thrombus in the left atrial appendage measuring 1.5 x 0.6 cm. Left atrial size was mildly dilated. A left atrial/left atrial appendage thrombus was detected. The LAA emptying velocity was 47 cm/s. Right Atrium: Pacemaker wire. Right atrial size was normal in size. Pericardium: There is no evidence of pericardial effusion. Mitral Valve: The mitral valve is normal in structure. Mild mitral valve regurgitation. No evidence of mitral valve stenosis. Tricuspid Valve: The tricuspid valve is normal in structure. Tricuspid valve regurgitation is mild to moderate. No evidence of tricuspid stenosis. Aortic Valve: The aortic valve is tricuspid. Aortic valve regurgitation is not visualized. No aortic stenosis is present. Pulmonic Valve: The pulmonic valve was normal in  structure. Pulmonic valve regurgitation is trivial. No evidence of pulmonic stenosis. Aorta: The aortic root is normal in size and structure. There is mild (Grade II) atheroma plaque involving the descending aorta. Venous: The inferior vena cava is normal in size with greater than 50% respiratory variability, suggesting right atrial pressure of 3 mmHg. IAS/Shunts: Evidence of atrial level shunting detected by color flow Doppler. A small patent foramen ovale is detected with predominantly left to right shunting across the atrial septum. Additional Comments: A pacer wire is visualized.  TRICUSPID VALVE TR Peak grad:   47.6 mmHg TR Vmax:        345.00 cm/s Skeet Latch MD Electronically signed by Skeet Latch MD Signature Date/Time: 02/06/2020/2:13:57 PM    Final         Scheduled Meds: . amLODipine  2.5 mg Oral Daily  .  atorvastatin  20 mg Oral Daily  . Chlorhexidine Gluconate Cloth  6 each Topical Q0600  . ferrous sulfate  325 mg Oral BID WC  . insulin aspart  0-6 Units Subcutaneous TID WC  . metoprolol succinate  25 mg Oral Daily  . pantoprazole  40 mg Oral BID  . sodium bicarbonate  650 mg Oral TID  . tamsulosin  0.4 mg Oral Daily   Continuous Infusions: . sodium chloride    . sodium chloride    . sodium chloride 10 mL/hr at 02/06/20 1126  .  ceFAZolin (ANCEF) IV 2 g (02/06/20 0957)  . dextrose 5 % and 0.45% NaCl 100 mL/hr at 02/06/20 0633     LOS: 4 days    Time spent: 35 minutes    Wendell Fiebig A Mariluz Crespo, MD [facility]   If 7PM-7AM, please contact night-coverage [email or web address]  02/06/2020, 2:56 PM

## 2020-02-06 NOTE — Progress Notes (Addendum)
ANTICOAGULATION CONSULT NOTE - Initial Consult  Pharmacy Consult for heparin Indication: atrial fibrillation and atrial thrombus  Allergies  Allergen Reactions  . Simvastatin Other (See Comments)    Abdominal cramps  . Nitrostat [Nitroglycerin] Other (See Comments)    Causes blood pressure to "bottom out"    Patient Measurements: Height: 5\' 6"  (167.6 cm) Weight: 80.2 kg (176 lb 12.9 oz) IBW/kg (Calculated) : 63.8 Heparin Dosing Weight: 80 kg  Vital Signs: Temp: 98 F (36.7 C) (01/28 1415) Temp Source: Oral (01/28 1415) BP: 138/53 (01/28 1415) Pulse Rate: 61 (01/28 1415)  Labs: Recent Labs    02/04/20 0213 02/05/20 0239 02/05/20 0834 02/06/20 0146 02/06/20 0805  HGB 7.5*  --  8.0*  --  7.8*  HCT 25.6*  --  27.2*  --  26.3*  PLT 178  --  169  --  200  CREATININE 4.58* 4.44*  --  4.25*  --     Estimated Creatinine Clearance: 13.6 mL/min (A) (by C-G formula based on SCr of 4.25 mg/dL (H)).   Medical History: Past Medical History:  Diagnosis Date  . Arthritis    "shoulders" (09/07/2016)  . Atrial fibrillation ([place])   . BPH (benign prostatic hyperplasia)   . Chronic kidney disease (CKD), stage III (moderate) (HCC)   . Coronary artery disease   . DDD (degenerative disc disease), cervical   . DVT (deep venous thrombosis) ([place]) 12/2013   LLE  . GERD (gastroesophageal reflux disease)   . Gout   . High cholesterol   . History of blood transfusion 09/06/2016   2 u PRBC  . History of scarlet fever 1940s  . History of stress test 06/2011   No significant ischemia, this is a low risk scan. Clinical correlation recommended Abnormal myocardial perfusion study.  Marland Kitchen Hx of echocardiogram 05/2009   EF 40-45%, he did have mild annular calcification with mild-to-moderate MR and mild TR as well as aortic valve sclerosis. Estimated RV systolic pressure was 21 mm.  . Hypertension   . Iron deficiency anemia   . Kidney failure   . 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  Neck pain    "not chronic" (09/07/2016)  . Obesity   . OSA on CPAP   . RBBB   . Renal insufficiency   . Right renal mass   . Spinal stenosis of lumbar region     Medications:  Medications Prior to Admission  Medication Sig Dispense Refill Last Dose  . acetaminophen (TYLENOL) 500 MG tablet Take 500-1,000 mg by mouth every 6 (six) hours as needed for mild pain, moderate pain or headache.    Past Week at Unknown time  . allopurinol (ZYLOPRIM) 300 MG tablet Take 300 mg by mouth daily.   02/01/2020 at am  . amLODipine (NORVASC) 5 MG tablet Take 2.5 mg by mouth daily.    02/01/2020 at am  . atorvastatin (LIPITOR) 20 MG tablet TAKE 1 TABLET BY MOUTH DAILY (Patient taking differently: Take 20 mg by mouth daily.) 90 tablet 2 02/01/2020 at am  . b complex vitamins tablet Take 1 tablet by mouth daily.   02/01/2020 at am  . docusate sodium (COLACE) 100 MG capsule Take 2 capsules (200 mg total) by mouth daily. 30 capsule 0 02/01/2020 at am  . ferrous sulfate 325 (65 FE) MG tablet Take 1 tablet (325 mg total) by mouth 2 (two) times daily with a meal. 60 tablet 0 02/01/2020 at am  . furosemide (LASIX) 40 MG tablet  Take 0.5 tablets (20 mg total) by mouth daily. 30 tablet  02/01/2020 at am  . metoprolol succinate (TOPROL-XL) 25 MG 24 hr tablet TAKE 1 TABLET BY MOUTH DAILY (Patient taking differently: Take 25 mg by mouth daily.) 90 tablet 3 02/01/2020 at 9:30am  . pantoprazole (PROTONIX) 40 MG tablet Take 1 tablet (40 mg total) by mouth 2 (two) times daily. 60 tablet 01 02/01/2020 at am  . Rivaroxaban (XARELTO) 15 MG TABS tablet Take 1 tablet (15 mg total) by mouth daily after supper. 42 tablet  2 weeks ago?  . sodium bicarbonate 650 MG tablet Take 1 tablet (650 mg total) by mouth 3 (three) times daily. 90 tablet 0 02/01/2020 at am  . Tamsulosin HCl (FLOMAX) 0.4 MG CAPS Take 1 capsule (0.4 mg total) by mouth daily. 30 capsule 0 02/01/2020 at am   Scheduled:  . amLODipine  2.5 mg Oral Daily  . atorvastatin  20 mg Oral  Daily  . Chlorhexidine Gluconate Cloth  6 each Topical Q0600  . insulin aspart  0-6 Units Subcutaneous TID WC  . metoprolol succinate  25 mg Oral Daily  . pantoprazole  40 mg Oral BID  . sodium bicarbonate  650 mg Oral TID  . tamsulosin  0.4 mg Oral Daily   Infusions:  . sodium chloride    . sodium chloride    . sodium chloride 10 mL/hr at 02/06/20 1126  .  ceFAZolin (ANCEF) IV 2 g (02/06/20 0957)  . dextrose 5 % and 0.45% NaCl 100 mL/hr at 02/06/20 4403  . ferumoxytol      Assessment: Pt was previously on Xarelto for his AF. TTE also showed atrial thrombus. His Xarelto has been on hold for anemia. Plan to use IV heparin for now. He has not had xarelto for >7days. We will shoot for a lower therapeutic range.   hgb 7.8, plt wnl Scr 4.25  Goal of Therapy:  Heparin level 0.3-0.7 units/ml Monitor platelets by anticoagulation protocol: Yes   Plan:  Heparin at 1200 units/hr Check 8 hr HL then daily Daily CBC  Onnie Boer, PharmD, BCIDP, AAHIVP, CPP Infectious Disease Pharmacist 02/06/2020 4:54 PM

## 2020-02-06 NOTE — Progress Notes (Signed)
  Echocardiogram Echocardiogram Transesophageal has been performed.  Paul Price 02/06/2020, 1:39 PM

## 2020-02-06 NOTE — CV Procedure (Signed)
Brief TEE Note  LVEF 40-45% Global hypkinesis LA appendage thrombus noted Mild-moderate TR Mild mitral regurgitation Trivial PR No evidence of endocarditis  For additional details see full report.  Paul Price C. Oval Linsey, MD, [facility] 02/06/2020 1:22 PM

## 2020-02-06 NOTE — H&P (Signed)
Paul Price is a 80 y.o. male who has presented today for surgery, with the diagnosis of bacteremia.  The various methods of treatment have been discussed with the patient and family. After consideration of risks, benefits and other options for treatment, the patient has consented to  Procedure(s): TRANSESOPHAGEAL ECHOCARDIOGRAM (TEE) (N/A) as a surgical intervention .  The patient's history has been reviewed, patient examined, no change in status, stable for surgery.  I have reviewed the patient's chart and labs.  Questions were answered to the patient's satisfaction.    [name] C. Oval Linsey, MD, [facility]  02/06/2020 12:33 PM

## 2020-02-06 NOTE — Progress Notes (Signed)
Remote pacemaker transmission.   

## 2020-02-06 NOTE — Progress Notes (Signed)
PT Cancellation Note  Patient Details Name: Paul Price MRN: [id number] DOB: 1940/06/17   Cancelled Treatment:    Reason Eval/Treat Not Completed: Patient at procedure or test/unavailable Patient off unit at procedure. PT will re-attempt as time allows.   Loralei Radcliffe A. Gilford Rile PT, DPT Acute Rehabilitation Services Pager [phone] Office [phone]    Alda Lea 02/06/2020, 12:34 PM

## 2020-02-06 NOTE — Transfer of Care (Signed)
Immediate Anesthesia Transfer of Care Note  Patient: Paul Price  Procedure(s) Performed: TRANSESOPHAGEAL ECHOCARDIOGRAM (TEE) (N/A )  Patient Location: PACU and Endoscopy Unit  Anesthesia Type:MAC  Level of Consciousness: drowsy and patient cooperative  Airway & Oxygen Therapy: Patient Spontanous Breathing and Patient connected to nasal cannula oxygen  Post-op Assessment: Report given to RN and Post -op Vital signs reviewed and stable  Post vital signs: Reviewed and stable  Last Vitals:  Vitals Value Taken Time  BP 114/37 02/06/20 1328  Temp    Pulse 63 02/06/20 1331  Resp 28 02/06/20 1331  SpO2 96 % 02/06/20 1331  Vitals shown include unvalidated device data.  Last Pain:  Vitals:   02/06/20 1114  TempSrc: Axillary  PainSc: 0-No pain         Complications: No complications documented.

## 2020-02-07 DIAGNOSIS — N179 Acute kidney failure, unspecified: Secondary | ICD-10-CM | POA: Diagnosis not present

## 2020-02-07 LAB — CBC
HCT: 24.7 % — ABNORMAL LOW (ref 39.0–52.0)
Hemoglobin: 7 g/dL — ABNORMAL LOW (ref 13.0–17.0)
MCH: 24.2 pg — ABNORMAL LOW (ref 26.0–34.0)
MCHC: 28.3 g/dL — ABNORMAL LOW (ref 30.0–36.0)
MCV: 85.5 fL (ref 80.0–100.0)
Platelets: 161 10*3/uL (ref 150–400)
RBC: 2.89 MIL/uL — ABNORMAL LOW (ref 4.22–5.81)
RDW: 19.8 % — ABNORMAL HIGH (ref 11.5–15.5)
WBC: 10.5 10*3/uL (ref 4.0–10.5)
nRBC: 0 % (ref 0.0–0.2)

## 2020-02-07 LAB — GLUCOSE, CAPILLARY
Glucose-Capillary: 105 mg/dL — ABNORMAL HIGH (ref 70–99)
Glucose-Capillary: 133 mg/dL — ABNORMAL HIGH (ref 70–99)
Glucose-Capillary: 87 mg/dL (ref 70–99)
Glucose-Capillary: 94 mg/dL (ref 70–99)
Glucose-Capillary: 98 mg/dL (ref 70–99)

## 2020-02-07 LAB — BASIC METABOLIC PANEL
Anion gap: 9 (ref 5–15)
BUN: 45 mg/dL — ABNORMAL HIGH (ref 8–23)
CO2: 21 mmol/L — ABNORMAL LOW (ref 22–32)
Calcium: 7.8 mg/dL — ABNORMAL LOW (ref 8.9–10.3)
Chloride: 115 mmol/L — ABNORMAL HIGH (ref 98–111)
Creatinine, Ser: 4.47 mg/dL — ABNORMAL HIGH (ref 0.61–1.24)
GFR, Estimated: 13 mL/min — ABNORMAL LOW (ref >60)
Glucose, Bld: 97 mg/dL (ref 70–99)
Potassium: 3.6 mmol/L (ref 3.5–5.1)
Sodium: 145 mmol/L (ref 135–145)

## 2020-02-07 LAB — HEPARIN LEVEL (UNFRACTIONATED)
Heparin Unfractionated: 0.73 IU/mL — ABNORMAL HIGH (ref 0.30–0.70)
Heparin Unfractionated: 0.58 IU/mL (ref 0.30–0.70)

## 2020-02-07 MED ORDER — HEPARIN SODIUM (PORCINE) 1000 UNIT/ML IJ SOLN
INTRAMUSCULAR | Status: AC
  Filled 2020-02-07: qty 4

## 2020-02-07 MED ORDER — HEPARIN (PORCINE) 25000 UT/250ML-% IV SOLN
950.0000 [IU]/h | INTRAVENOUS | Status: DC
  Administered 2020-02-07: 1000 [IU]/h via INTRAVENOUS
  Administered 2020-02-08 – 2020-02-09 (×2): 950 [IU]/h via INTRAVENOUS
  Filled 2020-02-07 (×3): qty 250

## 2020-02-07 NOTE — Progress Notes (Signed)
Resting comfortably on my rounds - did not disturb. ID ok with discontinuing IV antibiotics today.  Inadequate upper extremity veins for fistula creation. Will need RUE AVG. Plan to do this early next week pending OR availability.  Will discuss with the patient tomorrow RE: risks / benefits / alternatives.  Yevonne Aline. Stanford Breed, MD [facility] Phone Number: [phone] 02/07/2020 9:31 AM

## 2020-02-07 NOTE — Progress Notes (Signed)
ANTICOAGULATION CONSULT NOTE - Initial Consult  Pharmacy Consult for heparin Indication: atrial fibrillation and atrial thrombus  Allergies  Allergen Reactions  . Simvastatin Other (See Comments)    Abdominal cramps  . Nitrostat [Nitroglycerin] Other (See Comments)    Causes blood pressure to "bottom out"    Patient Measurements: Height: 5\' 6"  (167.6 cm) Weight: 80.2 kg (176 lb 12.9 oz) IBW/kg (Calculated) : 63.8 Heparin Dosing Weight: 80 kg  Vital Signs: Temp: 97.6 F (36.4 C) (01/29 1550) Temp Source: Oral (01/29 1550) BP: 104/71 (01/29 1550) Pulse Rate: 68 (01/29 1550)  Labs: Recent Labs    02/05/20 0239 02/05/20 0834 02/05/20 0834 02/06/20 0146 02/06/20 0805 02/07/20 0322 02/07/20 0633 02/07/20 1638  HGB  --  8.0*   < >  --  7.8* 7.0*  --   --   HCT  --  27.2*  --   --  26.3* 24.7*  --   --   PLT  --  169  --   --  200 161  --   --   HEPARINUNFRC  --   --   --   --   --   --  0.73* 0.58  CREATININE 4.44*  --   --  4.25*  --  4.47*  --   --    < > = values in this interval not displayed.    Estimated Creatinine Clearance: 12.9 mL/min (A) (by C-G formula based on SCr of 4.47 mg/dL (H)).   Assessment: Pt was previously on Xarelto for his AF but this was held early Jan to 01/31/20 for GIB. 1/28 TTE showed LA thrombus. Pharmacy consulted for IV heparin.    HL 0.58 slightly above goal, which was decreased for recent GIB. H/H 7 and downtrending this AM, plt stable. Planned RUE AVG next week.    Goal of Therapy:  Heparin level 0.3-0.5 units/ml (Decreased for recent GIB) Monitor platelets by anticoagulation protocol: Yes   Plan:  Decrease heparin to 950 units/hr Monitor daily HL, CBC/plt Monitor for signs/symptoms of bleeding  F/u DOAC plan   Benetta Spar, PharmD, BCPS, [facility] Clinical Pharmacist  Please check AMION for all [place] phone numbers After 10:00 PM, call [place] [phone]

## 2020-02-07 NOTE — Progress Notes (Signed)
Daily labs drawn in HD.

## 2020-02-07 NOTE — Progress Notes (Addendum)
Paul Price Progress Note   Subjective:   1st HD finished early this AM.  He seems to feel better.  Hasn't tried to eat today. No new issues.   Objective Vitals:   02/07/20 0317 02/07/20 0332 02/07/20 0432 02/07/20 0915  BP: (!) 144/56 (!) 144/65 (!) 136/56 (!) 146/58  Pulse: 62 (!) 59 65 60  Resp:   18 18  Temp:   98.8 F (37.1 C) 98.7 F (37.1 C)  TempSrc:   Oral Oral  SpO2: 97% 96% 96% 98%  Weight:      Height:       Physical Exam Gen: elderly man flat in bed - alert Eyes: anicteric, EOMI ENT: MM tacky Neck: supple, no JVD CV:  RRR, paced on monitor, no rub Back: clear lungs with normal WOB GU:  no foley Extr:  No edema Neuro: some tremors noted but no asterixis; hard of hearing  Additional Objective Labs: Basic Metabolic Panel: Recent Labs  Lab 02/04/20 0213 02/05/20 0239 02/06/20 0146 02/07/20 0322  NA 146* 145 143 145  K 3.8 3.4* 4.0 3.6  CL 113* 114* 113* 115*  CO2 20* 22 18* 21*  GLUCOSE 116* 102* 109* 97  BUN 55* 52* 49* 45*  CREATININE 4.58* 4.44* 4.25* 4.47*  CALCIUM 7.8* 7.9* 7.7* 7.8*  PHOS 3.0 3.2 2.9  --    Liver Function Tests: Recent Labs  Lab 02/01/20 1618 02/02/20 0055 02/03/20 0239 02/04/20 0213 02/05/20 0239 02/06/20 0146  AST 28 28  --   --   --   --   ALT 24 22  --   --   --   --   ALKPHOS 126 109  --   --   --   --   BILITOT 0.8 0.9  --   --   --   --   PROT 5.9* 5.3*  --   --   --   --   ALBUMIN 2.4* 2.2*   < > 1.8* 1.8* 1.7*   < > = values in this interval not displayed.   No results for input(s): LIPASE, AMYLASE in the last 168 hours. CBC: Recent Labs  Lab 02/01/20 1618 02/02/20 0055 02/03/20 0239 02/04/20 0213 02/05/20 0834 02/06/20 0805 02/07/20 0322  WBC 16.8*   < > 16.2* 12.6* 10.7* 11.8* 10.5  NEUTROABS 14.5*  --   --   --   --   --   --   HGB 9.2*   < > 7.9* 7.5* 8.0* 7.8* 7.0*  HCT 31.2*   < > 26.1* 25.6* 27.2* 26.3* 24.7*  MCV 84.1   < > 82.1 85.9 84.7 84.3 85.5  PLT 249   < > 195 178  169 200 161   < > = values in this interval not displayed.   Blood Culture    Component Value Date/Time   SDES BLOOD RIGHT ANTECUBITAL 02/04/2020 1646   SPECREQUEST  02/04/2020 1646    BOTTLES DRAWN AEROBIC ONLY Blood Culture adequate volume   CULT  02/04/2020 1646    NO GROWTH 2 DAYS Performed at [facility], [place] [address] [place], [place] [address]    REPTSTATUS PENDING 02/04/2020 1646    Cardiac Enzymes: No results for input(s): CKTOTAL, CKMB, CKMBINDEX, TROPONINI in the last 168 hours. CBG: Recent Labs  Lab 02/05/20 2057 02/06/20 0822 02/06/20 1638 02/06/20 2108 02/07/20 0919  GLUCAP 126* 112* 100* 100* 98   Iron Studies: No results for input(s): IRON, TIBC, TRANSFERRIN,  FERRITIN in the last 72 hours. @lablastinr3 @ Studies/Results: IR Fluoro Guide CV Line Right  Result Date: 02/05/2020 INDICATION: 80 year old male referred for temporary hemodialysis catheter EXAM: IMAGE GUIDED TEMPORARY HEMODIALYSIS CATHETER MEDICATIONS: None. ANESTHESIA/SEDATION: None FLUOROSCOPY TIME:  Fluoroscopy Time: 0 minutes 6 seconds (1 mGy). COMPLICATIONS: None PROCEDURE: Informed written consent was obtained from the patient's family after a discussion of the risks, benefits, and alternatives to treatment. Questions regarding the procedure were encouraged and answered. The right neck was prepped with chlorhexidine in a sterile fashion, and a sterile drape was applied covering the operative field. Maximum barrier sterile technique with sterile gowns and gloves were used for the procedure. A timeout was performed prior to the initiation of the procedure. A micropuncture kit was utilized to access the right internal jugular vein under direct, real-time ultrasound guidance after the overlying soft tissues were anesthetized with 1% lidocaine with epinephrine. Ultrasound image documentation was performed. The microwire was kinked to measure appropriate catheter length. A stiff glidewire was advanced  to the level of the IVC. A 19 cm hemodialysis catheter was then placed over the wire. Final catheter positioning was confirmed and documented with a spot radiographic image. The catheter aspirates and flushes normally. The catheter was flushed with appropriate volume heparin dwells. Dressings were applied. The patient tolerated the procedure well without immediate post procedural complication. . IMPRESSION: Status post image guided right IJ temporary hemodialysis catheter. Catheter ready for use. Signed, Dulcy Fanny. Dellia Nims, RPVI [facility] Radiology Electronically Signed   By: Corrie Mckusick D.O.   On: 02/05/2020 12:43   IR US Guide Vasc Access Right  Result Date: 02/05/2020 INDICATION: 80 year old male referred for temporary hemodialysis catheter EXAM: IMAGE GUIDED TEMPORARY HEMODIALYSIS CATHETER MEDICATIONS: None. ANESTHESIA/SEDATION: None FLUOROSCOPY TIME:  Fluoroscopy Time: 0 minutes 6 seconds (1 mGy). COMPLICATIONS: None PROCEDURE: Informed written consent was obtained from the patient's family after a discussion of the risks, benefits, and alternatives to treatment. Questions regarding the procedure were encouraged and answered. The right neck was prepped with chlorhexidine in a sterile fashion, and a sterile drape was applied covering the operative field. Maximum barrier sterile technique with sterile gowns and gloves were used for the procedure. A timeout was performed prior to the initiation of the procedure. A micropuncture kit was utilized to access the right internal jugular vein under direct, real-time ultrasound guidance after the overlying soft tissues were anesthetized with 1% lidocaine with epinephrine. Ultrasound image documentation was performed. The microwire was kinked to measure appropriate catheter length. A stiff glidewire was advanced to the level of the IVC. A 19 cm hemodialysis catheter was then placed over the wire. Final catheter  positioning was confirmed and documented with a spot radiographic image. The catheter aspirates and flushes normally. The catheter was flushed with appropriate volume heparin dwells. Dressings were applied. The patient tolerated the procedure well without immediate post procedural complication. . IMPRESSION: Status post image guided right IJ temporary hemodialysis catheter. Catheter ready for use. Signed, Dulcy Fanny. Dellia Nims, RPVI [facility] Radiology Electronically Signed   By: Corrie Mckusick D.O.   On: 02/05/2020 12:43   ECHO TEE  Result Date: 02/06/2020    TRANSESOPHOGEAL ECHO REPORT   Patient Name:   Paul Price Date of Exam: 02/06/2020 Medical Rec #:  [id number]      Height:       66.0 in Accession #:    [id number]     Weight:  176.8 lb Date of Birth:  1940-11-14       BSA:          1.898 m Patient Age:    80 years       BP:           131/44 mmHg Patient Gender: M              HR:           62 bpm. Exam Location:  Inpatient Procedure: Transesophageal Echo, Cardiac Doppler and Color Doppler Indications:     Bacteremia  History:         Patient has prior history of Echocardiogram examinations, most                  recent 01/08/2020. Arrythmias:Atrial Fibrillation,                  Signs/Symptoms:Bacteremia; Risk Factors:Hypertension and                  Dyslipidemia. Covid+, Staph Aureus.  Sonographer:     Dustin Flock Referring Phys:  [id number] Abigail Butts Diagnosing Phys: Skeet Latch MD PROCEDURE: The transesophogeal probe was passed without difficulty through the esophogus of the patient. Sedation performed by different physician. The patient was monitored while under deep sedation. Anesthestetic sedation was provided intravenously by Anesthesiology: 72.84m of Propofol. Image quality was good. The patient's vital signs; including heart rate, blood pressure, and oxygen saturation; remained stable throughout the procedure. The patient  developed no complications during the procedure. IMPRESSIONS  1. Global hypokinesis worse in the septum. Left ventricular ejection fraction, by estimation, is 40 to 45%. The left ventricle has mildly decreased function. The left ventricle demonstrates global hypokinesis. Left ventricular diastolic function could not be evaluated.  2. Right ventricular systolic function is mildly reduced. The right ventricular size is normal.  3. Thrombus in the left atrial appendage measuring 1.5 x 0.6 cm. Left atrial size was mildly dilated. A left atrial/left atrial appendage thrombus was detected. The LAA emptying velocity was 47 cm/s.  4. Pacemaker wire.  5. The mitral valve is normal in structure. Mild mitral valve regurgitation. No evidence of mitral stenosis.  6. Tricuspid valve regurgitation is mild to moderate.  7. The aortic valve is tricuspid. Aortic valve regurgitation is not visualized. No aortic stenosis is present.  8. There is mild (Grade II) atheroma plaque involving the descending aorta.  9. The inferior vena cava is normal in size with greater than 50% respiratory variability, suggesting right atrial pressure of 3 mmHg. 10. Evidence of atrial level shunting detected by color flow Doppler. There is a small patent foramen ovale with predominantly left to right shunting across the atrial septum. Conclusion(s)/Recommendation(s): Normal biventricular function without evidence of hemodynamically significant valvular heart disease. FINDINGS  Left Ventricle: Global hypokinesis worse in the septum. Left ventricular ejection fraction, by estimation, is 40 to 45%. The left ventricle has mildly decreased function. The left ventricle demonstrates global hypokinesis. The left ventricular internal cavity size was normal in size. There is no left ventricular hypertrophy. Left ventricular diastolic function could not be evaluated. Right Ventricle: The right ventricular size is normal. No increase in right ventricular wall  thickness. Right ventricular systolic function is mildly reduced. Left Atrium: Thrombus in the left atrial appendage measuring 1.5 x 0.6 cm. Left atrial size was mildly dilated. A left atrial/left atrial appendage thrombus was detected. The LAA emptying velocity was 47 cm/s. Right Atrium: Pacemaker wire. Right atrial  size was normal in size. Pericardium: There is no evidence of pericardial effusion. Mitral Valve: The mitral valve is normal in structure. Mild mitral valve regurgitation. No evidence of mitral valve stenosis. Tricuspid Valve: The tricuspid valve is normal in structure. Tricuspid valve regurgitation is mild to moderate. No evidence of tricuspid stenosis. Aortic Valve: The aortic valve is tricuspid. Aortic valve regurgitation is not visualized. No aortic stenosis is present. Pulmonic Valve: The pulmonic valve was normal in structure. Pulmonic valve regurgitation is trivial. No evidence of pulmonic stenosis. Aorta: The aortic root is normal in size and structure. There is mild (Grade II) atheroma plaque involving the descending aorta. Venous: The inferior vena cava is normal in size with greater than 50% respiratory variability, suggesting right atrial pressure of 3 mmHg. IAS/Shunts: Evidence of atrial level shunting detected by color flow Doppler. A small patent foramen ovale is detected with predominantly left to right shunting across the atrial septum. Additional Comments: A pacer wire is visualized.  TRICUSPID VALVE TR Peak grad:   47.6 mmHg TR Vmax:        345.00 cm/s Skeet Latch MD Electronically signed by Skeet Latch MD Signature Date/Time: 02/06/2020/2:13:57 PM    Final    Medications: . sodium chloride    . sodium chloride    . sodium chloride 10 mL/hr at 02/06/20 1126  .  ceFAZolin (ANCEF) IV 2 g (02/07/20 1017)  . dextrose 5 % and 0.45% NaCl 100 mL/hr at 02/06/20 0633  . heparin 1,000 Units/hr (02/07/20 1015)   . amLODipine  2.5 mg Oral Daily  . atorvastatin  20 mg Oral  Daily  . Chlorhexidine Gluconate Cloth  6 each Topical Q0600  . heparin sodium (porcine)      . insulin aspart  0-6 Units Subcutaneous TID WC  . metoprolol succinate  25 mg Oral Daily  . pantoprazole  40 mg Oral BID  . sodium bicarbonate  650 mg Oral TID  . sodium chloride flush  10-40 mL Intracatheter Q12H  . tamsulosin  0.4 mg Oral Daily   Assessment/Plan **AKI on CKD 5:  Initially thought to be prerenal but hasn't improved with volume resuscitation.  eGFR 12 is likely overestimation with advanced age and low muscle mass; 24h urine has been logistically impossible.  Perhaps the underlying issue is uremia leading to nausea and we have decided to proceed with trial of dialysis.  In light of concern for bacteremia IR placed RIJ temp cath.  VVS available for perm access once infectious issues resolved.  HD #1 1/28, plan next 1/31.    **Staph UTI/ concern for bacteremia:  ID following.  On cefazolin.  TEE neg.  Blood cultures NGTD.  Plans to stop abx tomorrow after 5d coverage for UTI.  **Nausea/emesis/anorexia:  Abd X ray unrevealing.  Persistent.  Trial of dialysis as this may be a uremic symptom.  **Anemia:  On po iron, trend H/H; no indication for transfusion currently but into 7s now.  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  **recent COVID:  S/p steroids and remdesivir last admission; CXR improved, ok on RA.   Off precautions per [facility] policy.   **A fib:  Paced currently, xarelto on hold for now -- would consider coumadin or eliquis if resumed this admission given eGFR < 15. Now with cardiac thrombus cardiology determining anticoag.   **Renal mass: noted on [place] last admission, plans for outpt urology f/u.  I discussed with pt and wife 1/27 - they were unaware of this.  Jannifer Hick MD 02/07/2020, 11:54 AM  [facility] Pager: [phone]

## 2020-02-07 NOTE — Progress Notes (Signed)
PROGRESS NOTE    Paul Price  SEG:[id number] DOB: [birth year] DOA: 02/01/2020 PCP: [name], MD   Brief Narrative: 80 year old with past medical history significant for paroxysmal A. fib, chronic kidney disease, hypertension, hyperlipidemia, BPH who presented to the ED complaining of shortness of breath and nausea.  Patient was recently discharged on 01/26/2020 after admission for upper GI bleed, AKI and Covid pneumonia.  Patient finished a course of breath then severe and is steroids.  He did not have oxygen requirement at that time.  Patient was evaluated by GI at that time and advised to hold Xarelto until 01/31/2020.  Patient did not resume Xarelto at home.  Patient was discharged with a creatinine of 3.7.  He presents with nausea vomiting, unable to keep anything down.  Patient admitted with poor oral intake, dehydration, hyponatremia, nausea and AKI.    Assessment & Plan:   Principal Problem:   AKI (acute kidney injury) ([place]) Active Problems:   HTN (hypertension)   Hyperlipidemia with target LDL less than 70   OSA on CPAP   PAF (paroxysmal atrial fibrillation) ([facility])   COVID-19 virus infection   Intractable nausea and vomiting   Hypernatremia   Elevated troponin  1-AKI on CKD stage IV: Recent creatinine at discharge was 3.7.  Presented with a creatinine of 4.5 Continue to hold Lasix. Appreciate nephrology evaluation.  Poor oral intake. Cr at 4.4.  Nephrology planning trial of HD> for HD catheter placement 1/27. [facility] fistula time to be determine by Vascular.  HD 1/28   2-Staph Aureus in urine.  UTI: urine culture grew  Staph aureus.  Completed cefazolin 6 days.  TEE negative for endocarditis.  Appreciate ID recommendations.  Blood culture obtain 1/26: No growth to date,.   Hyponatremia: Related to hypovolemia poor oral intake. Resolved With IV fluids.  Hypernatremia;  Resolved with fluids.   3-Hypokalemia: Resolved.   4-A. Fib: Per GI from last admission ok  to resume  Xarelto 1/22. We were holding xarelto because hb was trending down.  TEE showed, Left Atrium appendage thrombus.  Hb has remain stable 7--8. He denies black stool. Last episode was one week ago.  Discussed option for heparin Gtt and then transition to eliquis. Patient would like opinion from his cardiologist.  Evaluated by cardiologist who agrees with Heparin. Monitor hb.   HFrEF;  Echo 2021 ejection fraction 40 to 45% Monitor volume status Continue to hold IV Lasix  Nausea and vomiting; He denies abdominal pain. KUB negative for bowel obstruction Supportive care  Elevated troponin: Most likely demand ischemia and worsening renal function. Denies chest pain  Generalized weakness: PT eval  Hypertension: Continue with Norvasc and Metoprolol  Covid positive, tested 01/20/2020 Completed a course of rhythm severe and IV steroid Per Dr Baxter Flattery, ok to discontinue isolation.      Estimated body mass index is 28.54 kg/m as calculated from the following:   Height as of this encounter: 5\' 6"  (1.676 m).   Weight as of this encounter: 80.2 kg.   DVT prophylaxis: Scd Code Status: Full code Family Communication: Care  discussed with Wife who was at bedside.  Disposition Plan:  Status is: Inpatient  Remains inpatient appropriate because:Persistent severe electrolyte disturbances   Dispo: The patient is from: Home              Anticipated d/c is to: Home              Anticipated d/c date is: 3 days  Patient currently is not medically stable to d/c.   Difficult to place patient No        Consultants:   Nephrology,.   Procedures:  none Antimicrobials:  Ceftriaxone   Subjective: He is feeling well, ate some breakfast( pineapple sauce, juice, fruit cup)  Did eat that much lunch. Had BM this am, color brown.    Objective: Vitals:   02/07/20 0317 02/07/20 0332 02/07/20 0432 02/07/20 0915  BP: (!) 144/56 (!) 144/65 (!) 136/56 (!) 146/58   Pulse: 62 (!) 59 65 60  Resp:   18 18  Temp:   98.8 F (37.1 C) 98.7 F (37.1 C)  TempSrc:   Oral Oral  SpO2: 97% 96% 96% 98%  Weight:      Height:        Intake/Output Summary (Last 24 hours) at 02/07/2020 1431 Last data filed at 02/07/2020 0432 Gross per 24 hour  Intake 75 ml  Output 0 ml  Net 75 ml   Filed Weights   02/02/20 1952  Weight: 80.2 kg    Examination:  General exam: NAD Respiratory system: CTA Cardiovascular system: S 1, S 2 RRR Gastrointestinal system: BS present, soft, nt Central nervous system: Alert, and oriented Extremities: No edema    Data Reviewed: I have personally reviewed following labs and imaging studies  CBC: Recent Labs  Lab 02/01/20 1618 02/02/20 0055 02/03/20 0239 02/04/20 0213 02/05/20 0834 02/06/20 0805 02/07/20 0322  WBC 16.8*   < > 16.2* 12.6* 10.7* 11.8* 10.5  NEUTROABS 14.5*  --   --   --   --   --   --   HGB 9.2*   < > 7.9* 7.5* 8.0* 7.8* 7.0*  HCT 31.2*   < > 26.1* 25.6* 27.2* 26.3* 24.7*  MCV 84.1   < > 82.1 85.9 84.7 84.3 85.5  PLT 249   < > 195 178 169 200 161   < > = values in this interval not displayed.   Basic Metabolic Panel: Recent Labs  Lab 02/02/20 0055 02/03/20 0239 02/03/20 1450 02/04/20 0213 02/05/20 0239 02/06/20 0146 02/07/20 0322  NA 150* 145 147* 146* 145 143 145  K 3.0* 2.9* 3.3* 3.8 3.4* 4.0 3.6  CL 117* 112* 113* 113* 114* 113* 115*  CO2 23 23 22  20* 22 18* 21*  GLUCOSE 155* 157* 143* 116* 102* 109* 97  BUN 65* 58* 58* 55* 52* 49* 45*  CREATININE 4.55* 4.47* 4.56* 4.58* 4.44* 4.25* 4.47*  CALCIUM 8.0* 7.8* 7.8* 7.8* 7.9* 7.7* 7.8*  MG 2.1  --   --   --   --   --   --   PHOS  --  2.5 2.6 3.0 3.2 2.9  --    GFR: Estimated Creatinine Clearance: 12.9 mL/min (A) (by C-G formula based on SCr of 4.47 mg/dL (H)). Liver Function Tests: Recent Labs  Lab 02/01/20 1618 02/02/20 0055 02/03/20 0239 02/03/20 1450 02/04/20 0213 02/05/20 0239 02/06/20 0146  AST 28 28  --   --   --   --    --   ALT 24 22  --   --   --   --   --   ALKPHOS 126 109  --   --   --   --   --   BILITOT 0.8 0.9  --   --   --   --   --   PROT 5.9* 5.3*  --   --   --   --   --  ALBUMIN 2.4* 2.2* 1.9* 2.0* 1.8* 1.8* 1.7*   No results for input(s): LIPASE, AMYLASE in the last 168 hours. No results for input(s): AMMONIA in the last 168 hours. Coagulation Profile: No results for input(s): INR, PROTIME in the last 168 hours. Cardiac Enzymes: No results for input(s): CKTOTAL, CKMB, CKMBINDEX, TROPONINI in the last 168 hours. BNP (last 3 results) No results for input(s): PROBNP in the last 8760 hours. HbA1C: No results for input(s): HGBA1C in the last 72 hours. CBG: Recent Labs  Lab 02/06/20 0822 02/06/20 1638 02/06/20 2108 02/07/20 0919 02/07/20 1155  GLUCAP 112* 100* 100* 98 133*   Lipid Profile: No results for input(s): CHOL, HDL, LDLCALC, TRIG, CHOLHDL, LDLDIRECT in the last 72 hours. Thyroid Function Tests: No results for input(s): TSH, T4TOTAL, FREET4, T3FREE, THYROIDAB in the last 72 hours. Anemia Panel: No results for input(s): VITAMINB12, FOLATE, FERRITIN, TIBC, IRON, RETICCTPCT in the last 72 hours. Sepsis Labs: Recent Labs  Lab 02/02/20 0055 02/03/20 0239  PROCALCITON 0.36 0.45    Recent Results (from the past 240 hour(s))  Urine culture     Status: Abnormal   Collection Time: 02/01/20  5:13 PM   Specimen: Urine, Random  Result Value Ref Range Status   Specimen Description URINE, RANDOM  Final   Special Requests   Final    NONE Performed at [facility], [address] [place], [place] [address]    Culture >=100,000 COLONIES/mL STAPHYLOCOCCUS AUREUS (A)  Final   Report Status 02/05/2020 FINAL  Final   Organism ID, Bacteria STAPHYLOCOCCUS AUREUS (A)  Final      Susceptibility   Staphylococcus aureus - MIC*    CIPROFLOXACIN <=0.5 SENSITIVE Sensitive     GENTAMICIN <=0.5 SENSITIVE Sensitive     NITROFURANTOIN <=16 SENSITIVE Sensitive     OXACILLIN <=0.25  SENSITIVE Sensitive     TETRACYCLINE <=1 SENSITIVE Sensitive     VANCOMYCIN <=0.5 SENSITIVE Sensitive     TRIMETH/SULFA <=10 SENSITIVE Sensitive     CLINDAMYCIN <=0.25 SENSITIVE Sensitive     RIFAMPIN <=0.5 SENSITIVE Sensitive     Inducible Clindamycin NEGATIVE Sensitive     * >=100,000 COLONIES/mL STAPHYLOCOCCUS AUREUS  Culture, blood (Routine X 2) w Reflex to ID Panel     Status: None (Preliminary result)   Collection Time: 02/04/20  4:40 PM   Specimen: BLOOD  Result Value Ref Range Status   Specimen Description BLOOD RIGHT ANTECUBITAL  Final   Special Requests   Final    BOTTLES DRAWN AEROBIC ONLY Blood Culture adequate volume   Culture   Final    NO GROWTH 2 DAYS Performed at [facility], [address] [place], [place] [address]    Report Status PENDING  Incomplete  Culture, blood (Routine X 2) w Reflex to ID Panel     Status: None (Preliminary result)   Collection Time: 02/04/20  4:46 PM   Specimen: BLOOD  Result Value Ref Range Status   Specimen Description BLOOD RIGHT ANTECUBITAL  Final   Special Requests   Final    BOTTLES DRAWN AEROBIC ONLY Blood Culture adequate volume   Culture   Final    NO GROWTH 2 DAYS Performed at [facility], [place] [address], [place], [place] [address]    Report Status PENDING  Incomplete         Radiology Studies: ECHO TEE  Result Date: 02/06/2020    TRANSESOPHOGEAL ECHO REPORT   Patient Name:   MYRTLE BARNHARD  Date of Exam: 02/06/2020 Medical Rec #:  [id number]      Height:       66.0 in Accession #:    [id number]     Weight:       176.8 lb Date of Birth:  [birth year]       BSA:          1.898 m Patient Age:    69 years       BP:           131/44 mmHg Patient Gender: M              HR:           62 bpm. Exam Location:  Inpatient Procedure: Transesophageal Echo, Cardiac Doppler and Color Doppler Indications:     Bacteremia  History:         Patient has prior history of Echocardiogram examinations, most                  recent  01/08/2020. Arrythmias:Atrial Fibrillation,                  Signs/Symptoms:Bacteremia; Risk Factors:Hypertension and                  Dyslipidemia. Covid+, Staph Aureus.  Sonographer:     Dustin Flock Referring Phys:  [id number] Abigail Butts Diagnosing Phys: Skeet Latch MD PROCEDURE: The transesophogeal probe was passed without difficulty through the esophogus of the patient. Sedation performed by different physician. The patient was monitored while under deep sedation. Anesthestetic sedation was provided intravenously by Anesthesiology: 72.18mg  of Propofol. Image quality was good. The patient's vital signs; including heart rate, blood pressure, and oxygen saturation; remained stable throughout the procedure. The patient developed no complications during the procedure. IMPRESSIONS  1. Global hypokinesis worse in the septum. Left ventricular ejection fraction, by estimation, is 40 to 45%. The left ventricle has mildly decreased function. The left ventricle demonstrates global hypokinesis. Left ventricular diastolic function could not be evaluated.  2. Right ventricular systolic function is mildly reduced. The right ventricular size is normal.  3. Thrombus in the left atrial appendage measuring 1.5 x 0.6 cm. Left atrial size was mildly dilated. A left atrial/left atrial appendage thrombus was detected. The LAA emptying velocity was 47 cm/s.  4. Pacemaker wire.  5. The mitral valve is normal in structure. Mild mitral valve regurgitation. No evidence of mitral stenosis.  6. Tricuspid valve regurgitation is mild to moderate.  7. The aortic valve is tricuspid. Aortic valve regurgitation is not visualized. No aortic stenosis is present.  8. There is mild (Grade II) atheroma plaque involving the descending aorta.  9. The inferior vena cava is normal in size with greater than 50% respiratory variability, suggesting right atrial pressure of 3 mmHg. 10. Evidence of atrial level shunting detected by color flow  Doppler. There is a small patent foramen ovale with predominantly left to right shunting across the atrial septum. Conclusion(s)/Recommendation(s): Normal biventricular function without evidence of hemodynamically significant valvular heart disease. FINDINGS  Left Ventricle: Global hypokinesis worse in the septum. Left ventricular ejection fraction, by estimation, is 40 to 45%. The left ventricle has mildly decreased function. The left ventricle demonstrates global hypokinesis. The left ventricular internal cavity size was normal in size. There is no left ventricular hypertrophy. Left ventricular diastolic function could not be evaluated. Right Ventricle: The right ventricular size is normal. No increase in right ventricular wall thickness. Right ventricular systolic function is mildly reduced.  Left Atrium: Thrombus in the left atrial appendage measuring 1.5 x 0.6 cm. Left atrial size was mildly dilated. A left atrial/left atrial appendage thrombus was detected. The LAA emptying velocity was 47 cm/s. Right Atrium: Pacemaker wire. Right atrial size was normal in size. Pericardium: There is no evidence of pericardial effusion. Mitral Valve: The mitral valve is normal in structure. Mild mitral valve regurgitation. No evidence of mitral valve stenosis. Tricuspid Valve: The tricuspid valve is normal in structure. Tricuspid valve regurgitation is mild to moderate. No evidence of tricuspid stenosis. Aortic Valve: The aortic valve is tricuspid. Aortic valve regurgitation is not visualized. No aortic stenosis is present. Pulmonic Valve: The pulmonic valve was normal in structure. Pulmonic valve regurgitation is trivial. No evidence of pulmonic stenosis. Aorta: The aortic root is normal in size and structure. There is mild (Grade II) atheroma plaque involving the descending aorta. Venous: The inferior vena cava is normal in size with greater than 50% respiratory variability, suggesting right atrial pressure of 3 mmHg.  IAS/Shunts: Evidence of atrial level shunting detected by color flow Doppler. A small patent foramen ovale is detected with predominantly left to right shunting across the atrial septum. Additional Comments: A pacer wire is visualized.  TRICUSPID VALVE TR Peak grad:   47.6 mmHg TR Vmax:        345.00 cm/s Skeet Latch MD Electronically signed by Skeet Latch MD Signature Date/Time: 02/06/2020/2:13:57 PM    Final         Scheduled Meds: . amLODipine  2.5 mg Oral Daily  . atorvastatin  20 mg Oral Daily  . Chlorhexidine Gluconate Cloth  6 each Topical Q0600  . heparin sodium (porcine)      . insulin aspart  0-6 Units Subcutaneous TID WC  . metoprolol succinate  25 mg Oral Daily  . pantoprazole  40 mg Oral BID  . sodium bicarbonate  650 mg Oral TID  . sodium chloride flush  10-40 mL Intracatheter Q12H  . tamsulosin  0.4 mg Oral Daily   Continuous Infusions: . sodium chloride    . sodium chloride    . sodium chloride 10 mL/hr at 02/06/20 1126  .  ceFAZolin (ANCEF) IV 2 g (02/07/20 1017)  . dextrose 5 % and 0.45% NaCl 100 mL/hr at 02/07/20 1234  . heparin 1,000 Units/hr (02/07/20 1015)     LOS: 5 days    Time spent: 35 minutes    Niamya Vittitow A Samanth Mirkin, MD [facility]   If 7PM-7AM, please contact night-coverage [email or web address]  02/07/2020, 2:31 PM

## 2020-02-07 NOTE — Progress Notes (Signed)
Physical Therapy Treatment Patient Details Name: Paul Price MRN: [id number] DOB: Feb 06, 1940 Today's Date: 02/07/2020    History of Present Illness Pt is an 80 y/o male admitted secondary to worsening covid symptoms. Pt with recent admission secondary to GI bleed. PMH includes a fib, DVT, HTN, gout, and OSA on CPAP.    PT Comments    Patient progressing towards physical therapy goals. Patient performed standing exercises with B UE support and supervision. Patient ambulated 2 x 25' with RW and min guard for safety. spO2 >90% on RA throughout, noted SOB and reports of fatigue following session. Continue to recommend HHPT following d/c.     Follow Up Recommendations  Home health PT;Supervision/Assistance - 24 hour     Equipment Recommendations  Rolling [name] with 5" wheels    Recommendations for Other Services       Precautions / Restrictions Precautions Precautions: Fall Restrictions Weight Bearing Restrictions: No    Mobility  Bed Mobility Overal bed mobility: Needs Assistance Bed Mobility: Supine to Sit;Sit to Supine     Supine to sit: Supervision Sit to supine: Supervision      Transfers Overall transfer level: Needs assistance Equipment used: Rolling [name] (2 wheeled) Transfers: Sit to/from Stand Sit to Stand: Supervision         General transfer comment: sit to stand x 10 with cues for hand placement and min guard for safety  Ambulation/Gait Ambulation/Gait assistance: Min guard Gait Distance (Feet): 50 Feet (2 x 25') Assistive device: Rolling [name] (2 wheeled) Gait Pattern/deviations: Step-through pattern;Decreased stride length Gait velocity: decreased   General Gait Details: spO2 >90% on RA, however noted SOB and fatigue   Stairs             Wheelchair Mobility    Modified Rankin (Stroke Patients Only)       Balance Overall balance assessment: Needs assistance Sitting-balance support: No upper extremity supported;Feet  supported Sitting balance-[name] Scale: Good     Standing balance support: No upper extremity supported;During functional activity Standing balance-[name] Scale: Fair                              Cognition Arousal/Alertness: Awake/alert Behavior During Therapy: WFL for tasks assessed/performed Overall Cognitive Status: Impaired/Different from baseline Area of Impairment: Attention;Memory;Safety/judgement                   Current Attention Level: Selective Memory: Decreased short-term memory   Safety/Judgement: Decreased awareness of deficits;Decreased awareness of safety     General Comments: Mild confusion with decreased safety awareness      Exercises General Exercises - Lower Extremity Hip Flexion/Marching: 10 reps;Standing Mini-Sqauts: 10 reps (sit to stand x 10)    General Comments        Pertinent Vitals/Pain Pain Assessment: Faces Faces Pain Scale: No hurt    Home Living                      Prior Function            PT Goals (current goals can now be found in the care plan section) Acute Rehab PT Goals Patient Stated Goal: home PT Goal Formulation: With patient Time For Goal Achievement: 02/16/20 Potential to Achieve Goals: Fair Progress towards PT goals: Progressing toward goals    Frequency    Min 3X/week      PT Plan Current plan remains appropriate    Co-evaluation  AM-PAC PT "6 Clicks" Mobility   Outcome Measure  Help needed turning from your back to your side while in a flat bed without using bedrails?: None Help needed moving from lying on your back to sitting on the side of a flat bed without using bedrails?: A Little Help needed moving to and from a bed to a chair (including a wheelchair)?: A Little Help needed standing up from a chair using your arms (e.g., wheelchair or bedside chair)?: A Little Help needed to walk in hospital room?: A Little Help needed climbing 3-5 steps with a  railing? : A Lot 6 Click Score: 18    End of Session Equipment Utilized During Treatment: Gait belt Activity Tolerance: Patient tolerated treatment well Patient left: in bed;with call bell/phone within reach Nurse Communication: Mobility status PT Visit Diagnosis: Unsteadiness on feet (R26.81);Muscle weakness (generalized) (M62.81)     Time: 1117-3567 PT Time Calculation (min) (ACUTE ONLY): 30 min  Charges:  $Therapeutic Exercise: 8-22 mins $Therapeutic Activity: 8-22 mins                     Paul Price A. Paul Price PT, DPT Acute Rehabilitation Services Pager [phone] Office [phone]    Paul Price 02/07/2020, 12:51 PM

## 2020-02-07 NOTE — Progress Notes (Addendum)
ANTICOAGULATION CONSULT NOTE - Initial Consult  Pharmacy Consult for heparin Indication: atrial fibrillation and atrial thrombus  Allergies  Allergen Reactions  . Simvastatin Other (See Comments)    Abdominal cramps  . Nitrostat [Nitroglycerin] Other (See Comments)    Causes blood pressure to "bottom out"    Patient Measurements: Height: 5\' 6"  (167.6 cm) Weight: 80.2 kg (176 lb 12.9 oz) IBW/kg (Calculated) : 63.8 Heparin Dosing Weight: 80 kg  Vital Signs: Temp: 98.8 F (37.1 C) (01/29 0432) Temp Source: Oral (01/29 0432) BP: 136/56 (01/29 0432) Pulse Rate: 65 (01/29 0432)  Labs: Recent Labs    02/05/20 0239 02/05/20 5027 02/05/20 0834 02/06/20 0146 02/06/20 0805 02/07/20 0322 02/07/20 0633  HGB  --  8.0*   < >  --  7.8* 7.0*  --   HCT  --  27.2*  --   --  26.3* 24.7*  --   PLT  --  169  --   --  200 161  --   HEPARINUNFRC  --   --   --   --   --   --  0.73*  CREATININE 4.44*  --   --  4.25*  --  4.47*  --    < > = values in this interval not displayed.    Estimated Creatinine Clearance: 12.9 mL/min (A) (by C-G formula based on SCr of 4.47 mg/dL (H)).   Medical History: Past Medical History:  Diagnosis Date  . Arthritis    "shoulders" (09/07/2016)  . Atrial fibrillation ([place])   . BPH (benign prostatic hyperplasia)   . Chronic kidney disease (CKD), stage III (moderate) (HCC)   . Coronary artery disease   . DDD (degenerative disc disease), cervical   . DVT (deep venous thrombosis) ([place]) 12/2013   LLE  . GERD (gastroesophageal reflux disease)   . Gout   . High cholesterol   . History of blood transfusion 09/06/2016   2 u PRBC  . History of scarlet fever 1940s  . History of stress test 06/2011   No significant ischemia, this is a low risk scan. Clinical correlation recommended Abnormal myocardial perfusion study.  Marland Kitchen Hx of echocardiogram 05/2009   EF 40-45%, he did have mild annular calcification with mild-to-moderate MR and mild TR as well as aortic valve  sclerosis. Estimated RV systolic pressure was 21 mm.  . Hypertension   . Iron deficiency anemia   . Kidney failure   . 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 Neck pain    "not chronic" (09/07/2016)  . Obesity   . OSA on CPAP   . RBBB   . Renal insufficiency   . Right renal mass   . Spinal stenosis of lumbar region     Medications:  Medications Prior to Admission  Medication Sig Dispense Refill Last Dose  . acetaminophen (TYLENOL) 500 MG tablet Take 500-1,000 mg by mouth every 6 (six) hours as needed for mild pain, moderate pain or headache.    Past Week at Unknown time  . allopurinol (ZYLOPRIM) 300 MG tablet Take 300 mg by mouth daily.   02/01/2020 at am  . amLODipine (NORVASC) 5 MG tablet Take 2.5 mg by mouth daily.    02/01/2020 at am  . atorvastatin (LIPITOR) 20 MG tablet TAKE 1 TABLET BY MOUTH DAILY (Patient taking differently: Take 20 mg by mouth daily.) 90 tablet 2 02/01/2020 at am  . b complex vitamins tablet Take 1 tablet by mouth daily.   02/01/2020 at  am  . docusate sodium (COLACE) 100 MG capsule Take 2 capsules (200 mg total) by mouth daily. 30 capsule 0 02/01/2020 at am  . ferrous sulfate 325 (65 FE) MG tablet Take 1 tablet (325 mg total) by mouth 2 (two) times daily with a meal. 60 tablet 0 02/01/2020 at am  . furosemide (LASIX) 40 MG tablet Take 0.5 tablets (20 mg total) by mouth daily. 30 tablet  02/01/2020 at am  . metoprolol succinate (TOPROL-XL) 25 MG 24 hr tablet TAKE 1 TABLET BY MOUTH DAILY (Patient taking differently: Take 25 mg by mouth daily.) 90 tablet 3 02/01/2020 at 9:30am  . pantoprazole (PROTONIX) 40 MG tablet Take 1 tablet (40 mg total) by mouth 2 (two) times daily. 60 tablet 01 02/01/2020 at am  . Rivaroxaban (XARELTO) 15 MG TABS tablet Take 1 tablet (15 mg total) by mouth daily after supper. 42 tablet  2 weeks ago?  . sodium bicarbonate 650 MG tablet Take 1 tablet (650 mg total) by mouth 3 (three) times daily. 90 tablet 0 02/01/2020 at am  . Tamsulosin HCl  (FLOMAX) 0.4 MG CAPS Take 1 capsule (0.4 mg total) by mouth daily. 30 capsule 0 02/01/2020 at am   Scheduled:  . amLODipine  2.5 mg Oral Daily  . atorvastatin  20 mg Oral Daily  . Chlorhexidine Gluconate Cloth  6 each Topical Q0600  . heparin sodium (porcine)      . insulin aspart  0-6 Units Subcutaneous TID WC  . metoprolol succinate  25 mg Oral Daily  . pantoprazole  40 mg Oral BID  . sodium bicarbonate  650 mg Oral TID  . sodium chloride flush  10-40 mL Intracatheter Q12H  . tamsulosin  0.4 mg Oral Daily   Infusions:  . sodium chloride    . sodium chloride    . sodium chloride 10 mL/hr at 02/06/20 1126  .  ceFAZolin (ANCEF) IV 2 g (02/06/20 2307)  . dextrose 5 % and 0.45% NaCl 100 mL/hr at 02/06/20 4098  . heparin 1,200 Units/hr (02/06/20 1759)    Assessment: Pt was previously on Xarelto for his AF. TTE also showed atrial thrombus. His Xarelto has been on hold for anemia. Plan to use IV heparin for now. He has not had xarelto for >7days. We will shoot for a lower therapeutic range (HL 0.3-0.5 units/ml).  Today, platelets remain WNL; hbg down to 7; INR 2.6. Heparin level above goal at 0.73; will decrease infusion by ~2 u/kg/hr to 1000 u/hr and check 8-hour heparin level. No signs or symptoms of bleeding noted per nursing. Provider to continue holding oral iron for now. Of note, patient did undergo HD yesterday; Scr 4.47.  Goal of Therapy:  Heparin level 0.3-0.5 units/ml Monitor platelets by anticoagulation protocol: Yes   Plan:  Heparin at 1000 units/hr Check 8 hr HL then daily Daily CBC  Alfonse Spruce, PharmD PGY2 ID Pharmacy Resident [phone]  02/07/2020 8:05 AM

## 2020-02-08 ENCOUNTER — Encounter (HOSPITAL_COMMUNITY): Payer: Self-pay | Admitting: Cardiovascular Disease

## 2020-02-08 DIAGNOSIS — N179 Acute kidney failure, unspecified: Secondary | ICD-10-CM | POA: Diagnosis not present

## 2020-02-08 LAB — CBC
HCT: 24.1 % — ABNORMAL LOW (ref 39.0–52.0)
Hemoglobin: 7.4 g/dL — ABNORMAL LOW (ref 13.0–17.0)
MCH: 25.3 pg — ABNORMAL LOW (ref 26.0–34.0)
MCHC: 30.7 g/dL (ref 30.0–36.0)
MCV: 82.3 fL (ref 80.0–100.0)
Platelets: 158 10*3/uL (ref 150–400)
RBC: 2.93 MIL/uL — ABNORMAL LOW (ref 4.22–5.81)
RDW: 19.9 % — ABNORMAL HIGH (ref 11.5–15.5)
WBC: 10.3 10*3/uL (ref 4.0–10.5)
nRBC: 0 % (ref 0.0–0.2)

## 2020-02-08 LAB — BASIC METABOLIC PANEL
Anion gap: 9 (ref 5–15)
BUN: 26 mg/dL — ABNORMAL HIGH (ref 8–23)
CO2: 25 mmol/L (ref 22–32)
Calcium: 7.7 mg/dL — ABNORMAL LOW (ref 8.9–10.3)
Chloride: 107 mmol/L (ref 98–111)
Creatinine, Ser: 3.53 mg/dL — ABNORMAL HIGH (ref 0.61–1.24)
GFR, Estimated: 17 mL/min — ABNORMAL LOW (ref >60)
Glucose, Bld: 94 mg/dL (ref 70–99)
Potassium: 3.7 mmol/L (ref 3.5–5.1)
Sodium: 141 mmol/L (ref 135–145)

## 2020-02-08 LAB — GLUCOSE, CAPILLARY
Glucose-Capillary: 81 mg/dL (ref 70–99)
Glucose-Capillary: 123 mg/dL — ABNORMAL HIGH (ref 70–99)
Glucose-Capillary: 94 mg/dL (ref 70–99)
Glucose-Capillary: 94 mg/dL (ref 70–99)

## 2020-02-08 LAB — HEPARIN LEVEL (UNFRACTIONATED): Heparin Unfractionated: 0.4 IU/mL (ref 0.30–0.70)

## 2020-02-08 MED ORDER — CEFAZOLIN SODIUM-DEXTROSE 2-4 GM/100ML-% IV SOLN
2.0000 g | INTRAVENOUS | Status: AC
  Administered 2020-02-09: 2 g via INTRAVENOUS
  Filled 2020-02-08 (×2): qty 100

## 2020-02-08 MED ORDER — CEFAZOLIN SODIUM-DEXTROSE 1-4 GM/50ML-% IV SOLN: 1.0000 g | INTRAVENOUS | Status: DC

## 2020-02-08 NOTE — Progress Notes (Signed)
   ASSESSMENT & PLAN:  Paul Price is a 80 y.o. male approaching ESRD. COVID+. UCx + for MSSA 02/01/20. S/P IV Cefazolin per ID. In need of permanent HD access. No good options for autogenous access. Will discuss with Dr. Carlis Abbott. At minimum will plan to convert percutaneous catheter to tunneled dialysis catheter so he can discharge. Will discuss performing RUE AVG in same setting with Dr. Carlis Abbott later today. NPO after midnight.   SUBJECTIVE:  No complaints. Explained plan for OR tomorrow.  OBJECTIVE:  BP (!) 122/55 (BP Location: Left Arm)   Pulse 76   Temp 97.7 F (36.5 C) (Oral)   Resp 17   Ht 5\' 6"  (1.676 m)   Wt 80.2 kg   SpO2 96%   BMI 28.54 kg/m   Intake/Output Summary (Last 24 hours) at 02/08/2020 1221 Last data filed at 02/08/2020 0448 Gross per 24 hour  Intake 537.32 ml  Output 150 ml  Net 387.32 ml    Percutaneous HD catheter in RIJ Pacemaker in L chest 2+ R brachial pulse  CBC Latest Ref Rng & Units 02/08/2020 02/07/2020 02/06/2020  WBC 4.0 - 10.5 K/uL 10.3 10.5 11.8(H)  Hemoglobin 13.0 - 17.0 g/dL 7.4(L) 7.0(L) 7.8(L)  Hematocrit 39.0 - 52.0 % 24.1(L) 24.7(L) 26.3(L)  Platelets 150 - 400 K/uL 158 161 200     CMP Latest Ref Rng & Units 02/08/2020 02/07/2020 02/06/2020  Glucose 70 - 99 mg/dL 94 97 109(H)  BUN 8 - 23 mg/dL 26(H) 45(H) 49(H)  Creatinine 0.61 - 1.24 mg/dL 3.53(H) 4.47(H) 4.25(H)  Sodium 135 - 145 mmol/L 141 145 143  Potassium 3.5 - 5.1 mmol/L 3.7 3.6 4.0  Chloride 98 - 111 mmol/L 107 115(H) 113(H)  CO2 22 - 32 mmol/L 25 21(L) 18(L)  Calcium 8.9 - 10.3 mg/dL 7.7(L) 7.8(L) 7.7(L)  Total Protein 6.5 - 8.1 g/dL - - -  Total Bilirubin 0.3 - 1.2 mg/dL - - -  Alkaline Phos 38 - 126 U/L - - -  AST 15 - 41 U/L - - -  ALT 0 - 44 U/L - - -    Estimated Creatinine Clearance: 16.3 mL/min (A) (by C-G formula based on SCr of 3.53 mg/dL (H)).  Yevonne Aline. Stanford Breed, MD [facility] Phone Number: [phone] 02/08/2020 12:21  PM

## 2020-02-08 NOTE — Progress Notes (Addendum)
[facility] Progress Note   Subjective:   Appetite improved - tells me ate first meal in weeks.  Brother bedside.  Feels DOE.    Objective Vitals:   02/07/20 0915 02/07/20 1550 02/07/20 1956 02/08/20 0429  BP: (!) 146/58 104/71 (!) 139/57 (!) 122/55  Pulse: 60 68 62 76  Resp: _0 Temp: 98.7 F (37.1 C) 97.6 F (36.4 C) 98.2 F (36.8 C) 97.7 F (36.5 C)  TempSrc: Oral Oral Oral Oral  SpO2: 98% 95% 97% 96%  Weight:      Height:       Physical Exam Gen: elderly man flat in bed - alert Eyes: anicteric, EOMI ENT: MM tacky Neck: supple, no JVD CV:  RRR, paced on monitor, no rub Back: clear lungs with normal WOB GU:  no foley Extr:  No edema Neuro: some tremors noted but no asterixis; hard of hearing  Additional Objective Labs: Basic Metabolic Panel: Recent Labs  Lab 02/04/20 0213 02/05/20 0239 02/06/20 0146 02/07/20 0322 02/08/20 0151  NA 146* 145 143 145 141  K 3.8 3.4* 4.0 3.6 3.7  CL 113* 114* 113* 115* 107  CO2 20* 22 18* 21* 25  GLUCOSE 116* 102* 109* 97 94  BUN 55* 52* 49* 45* 26*  CREATININE 4.58* 4.44* 4.25* 4.47* 3.53*  CALCIUM 7.8* 7.9* 7.7* 7.8* 7.7*  PHOS 3.0 3.2 2.9  --   --    Liver Function Tests: Recent Labs  Lab 02/01/20 1618 02/02/20 0055 02/03/20 0239 02/04/20 0213 02/05/20 0239 02/06/20 0146  AST 28 28  --   --   --   --   ALT 24 22  --   --   --   --   ALKPHOS 126 109  --   --   --   --   BILITOT 0.8 0.9  --   --   --   --   PROT 5.9* 5.3*  --   --   --   --   ALBUMIN 2.4* 2.2*   < > 1.8* 1.8* 1.7*   < > = values in this interval not displayed.   No results for input(s): LIPASE, AMYLASE in the last 168 hours. CBC: Recent Labs  Lab 02/01/20 1618 02/02/20 0055 02/04/20 0213 02/05/20 0834 02/06/20 0805 02/07/20 0322 02/08/20 0151  WBC 16.8*   < > 12.6* 10.7* 11.8* 10.5 10.3  NEUTROABS 14.5*  --   --   --   --   --   --   HGB 9.2*   < > 7.5* 8.0* 7.8* 7.0* 7.4*  HCT 31.2*   < > 25.6* 27.2* 26.3*  24.7* 24.1*  MCV 84.1   < > 85.9 84.7 84.3 85.5 82.3  PLT 249   < > 178 169 200 161 158   < > = values in this interval not displayed.   Blood Culture    Component Value Date/Time   SDES BLOOD RIGHT ANTECUBITAL 02/04/2020 1646   SPECREQUEST  02/04/2020 1646    BOTTLES DRAWN AEROBIC ONLY Blood Culture adequate volume   CULT  02/04/2020 1646    NO GROWTH 4 DAYS Performed at [facility], [place] [address], [place], [place] [address]    REPTSTATUS PENDING 02/04/2020 1646    Cardiac Enzymes: No results for input(s): CKTOTAL, CKMB, CKMBINDEX, TROPONINI in the last 168 hours. CBG: Recent Labs  Lab 02/07/20 1726 02/07/20 2332 02/07/20 2336 02/08/20 0738 02/08/20 1122  GLUCAP 105* 94 87  81 123*   Iron Studies: No results for input(s): IRON, TIBC, TRANSFERRIN, FERRITIN in the last 72 hours. _0 @ Studies/Results: ECHO TEE  Result Date: 02/06/2020    TRANSESOPHOGEAL ECHO REPORT   Patient Name:   Paul [facility] Date of Exam: 02/06/2020 Medical Rec #:  [id number]      Height:       66.0 in Accession #:    [id number]     Weight:       176.8 lb Date of Birth:  Nov 18, 1940       BSA:          1.898 m Patient Age:    80 years       BP:           131/44 mmHg Patient Gender: M              HR:           62 bpm. Exam Location:  Inpatient Procedure: Transesophageal Echo, Cardiac Doppler and Color Doppler Indications:     Bacteremia  History:         Patient has prior history of Echocardiogram examinations, most                  recent 01/08/2020. Arrythmias:Atrial Fibrillation,                  Signs/Symptoms:Bacteremia; Risk Factors:Hypertension and                  Dyslipidemia. Covid+, Staph Aureus.  Sonographer:     Dustin Flock Referring Phys:  [id number] Abigail Butts Diagnosing Phys: Skeet Latch MD PROCEDURE: The transesophogeal probe was passed without difficulty through the esophogus of the patient. Sedation performed by different physician. The patient was monitored while  under deep sedation. Anesthestetic sedation was provided intravenously by Anesthesiology: 72.2m of Propofol. Image quality was good. The patient's vital signs; including heart rate, blood pressure, and oxygen saturation; remained stable throughout the procedure. The patient developed no complications during the procedure. IMPRESSIONS  1. Global hypokinesis worse in the septum. Left ventricular ejection fraction, by estimation, is 40 to 45%. The left ventricle has mildly decreased function. The left ventricle demonstrates global hypokinesis. Left ventricular diastolic function could not be evaluated.  2. Right ventricular systolic function is mildly reduced. The right ventricular size is normal.  3. Thrombus in the left atrial appendage measuring 1.5 x 0.6 cm. Left atrial size was mildly dilated. A left atrial/left atrial appendage thrombus was detected. The LAA emptying velocity was 47 cm/s.  4. Pacemaker wire.  5. The mitral valve is normal in structure. Mild mitral valve regurgitation. No evidence of mitral stenosis.  6. Tricuspid valve regurgitation is mild to moderate.  7. The aortic valve is tricuspid. Aortic valve regurgitation is not visualized. No aortic stenosis is present.  8. There is mild (Grade II) atheroma plaque involving the descending aorta.  9. The inferior vena cava is normal in size with greater than 50% respiratory variability, suggesting right atrial pressure of 3 mmHg. 10. Evidence of atrial level shunting detected by color flow Doppler. There is a small patent foramen ovale with predominantly left to right shunting across the atrial septum. Conclusion(s)/Recommendation(s): Normal biventricular function without evidence of hemodynamically significant valvular heart disease. FINDINGS  Left Ventricle: Global hypokinesis worse in the septum. Left ventricular ejection fraction, by estimation, is 40 to 45%. The left ventricle has mildly decreased function. The left ventricle demonstrates global  hypokinesis. The left ventricular internal  cavity size was normal in size. There is no left ventricular hypertrophy. Left ventricular diastolic function could not be evaluated. Right Ventricle: The right ventricular size is normal. No increase in right ventricular wall thickness. Right ventricular systolic function is mildly reduced. Left Atrium: Thrombus in the left atrial appendage measuring 1.5 x 0.6 cm. Left atrial size was mildly dilated. A left atrial/left atrial appendage thrombus was detected. The LAA emptying velocity was 47 cm/s. Right Atrium: Pacemaker wire. Right atrial size was normal in size. Pericardium: There is no evidence of pericardial effusion. Mitral Valve: The mitral valve is normal in structure. Mild mitral valve regurgitation. No evidence of mitral valve stenosis. Tricuspid Valve: The tricuspid valve is normal in structure. Tricuspid valve regurgitation is mild to moderate. No evidence of tricuspid stenosis. Aortic Valve: The aortic valve is tricuspid. Aortic valve regurgitation is not visualized. No aortic stenosis is present. Pulmonic Valve: The pulmonic valve was normal in structure. Pulmonic valve regurgitation is trivial. No evidence of pulmonic stenosis. Aorta: The aortic root is normal in size and structure. There is mild (Grade II) atheroma plaque involving the descending aorta. Venous: The inferior vena cava is normal in size with greater than 50% respiratory variability, suggesting right atrial pressure of 3 mmHg. IAS/Shunts: Evidence of atrial level shunting detected by color flow Doppler. A small patent foramen ovale is detected with predominantly left to right shunting across the atrial septum. Additional Comments: A pacer wire is visualized.  TRICUSPID VALVE TR Peak grad:   47.6 mmHg TR Vmax:        345.00 cm/s Skeet Latch MD Electronically signed by Skeet Latch MD Signature Date/Time: 02/06/2020/2:13:57 PM    Final    Medications: . sodium chloride    . sodium  chloride    . sodium chloride 10 mL/hr at 02/06/20 1126  . [START ON 02/09/2020]  ceFAZolin (ANCEF) IV    . heparin 950 Units/hr (02/08/20 0636)   . amLODipine  2.5 mg Oral Daily  . atorvastatin  20 mg Oral Daily  . Chlorhexidine Gluconate Cloth  6 each Topical Q0600  . insulin aspart  0-6 Units Subcutaneous TID WC  . metoprolol succinate  25 mg Oral Daily  . pantoprazole  40 mg Oral BID  . sodium bicarbonate  650 mg Oral TID  . sodium chloride flush  10-40 mL Intracatheter Q12H  . tamsulosin  0.4 mg Oral Daily   Assessment/Plan **AKI on CKD 5:  Initially thought to be prerenal but hasn't improved with volume resuscitation.  eGFR 12 is likely overestimation with advanced age and low muscle mass; 24h urine has been logistically impossible (foley unable to inserted due to prostate; condom cath falling off).  We have initiated HD for uremia manifest as nausea and after HD #1 1/28 he has appreciable clinical improvement and can eat.  Plan next 1/31.   VVS following for perm access and convert temp cath to tunneled, as early as 1/31; appreciated.  **Staph UTI/ concern for bacteremia:  ID following.  On cefazolin.  TEE neg.  Blood cultures NGTD at 4 days.  Plans to stop abx tomorrow after 5d coverage for UTI.  **Nausea/emesis/anorexia:  Abd X ray unrevealing.  Persistent.  Trial of dialysis as this may be a uremic symptom has proven that nausea did improve after 1 HD.  Hopefully will continue to improve.  **Anemia:  On po iron, trend H/H; no indication for transfusion currently but into 7s now.  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  **  recent COVID:  S/p steroids and remdesivir last admission; CXR improved, ok on RA.   Off precautions per [facility] policy.   **A fib:  Paced currently, xarelto on hold for now -- would consider coumadin or eliquis if resumed this admission given eGFR < 15. Now with cardiac thrombus cardiology determining anticoag - heparin gtt now, likely eliquis after.  **Renal mass:  noted on [place] last admission, plans for outpt urology f/u.  I discussed with pt and wife 1/27 - they were unaware of this.   **DOE: lungs clear today, I suspect deconditioning and anemia.   Jannifer Hick MD 02/08/2020, 12:04 PM  [facility] Pager: [phone]

## 2020-02-08 NOTE — Progress Notes (Signed)
PROGRESS NOTE    Paul Price  GMW:[id number] DOB: 1940/01/27 DOA: 02/01/2020 PCP: [name], MD   Brief Narrative: 80 year old with past medical history significant for paroxysmal A. fib, chronic kidney disease, hypertension, hyperlipidemia, BPH who presented to the ED complaining of shortness of breath and nausea.  Patient was recently discharged on 01/26/2020 after admission for upper GI bleed, AKI and Covid pneumonia.  Patient finished a course of breath then severe and is steroids.  He did not have oxygen requirement at that time.  Patient was evaluated by GI at that time and advised to hold Xarelto until 01/31/2020.  Patient did not resume Xarelto at home.  Patient was discharged with a creatinine of 3.7.  He presents with nausea vomiting, unable to keep anything down.  Patient admitted with poor oral intake, dehydration, hyponatremia, nausea and AKI.    Assessment & Plan:   Principal Problem:   AKI (acute kidney injury) ([place]) Active Problems:   HTN (hypertension)   Hyperlipidemia with target LDL less than 70   OSA on CPAP   PAF (paroxysmal atrial fibrillation) (HCC)   COVID-19 virus infection   Intractable nausea and vomiting   Hypernatremia   Elevated troponin  1-AKI on CKD stage IV: Recent creatinine at discharge was 3.7.  Presented with a creatinine of 4.5 Continue to hold Lasix. Appreciate nephrology evaluation.  Poor oral intake. Cr at 4.4.  Nephrology planning trial of HD> for HD catheter placement 1/27. VA graft  time to be determine by Vascular. Plan to convert HD tunneled catheter on 1-31 Had first HD 1/28   2-Staph Aureus in urine.  UTI: urine culture grew  Staph aureus.  Completed cefazolin 6 days.  TEE negative for endocarditis.  Appreciate ID recommendations.  Blood culture obtain 1/26: No growth to date,.   Hyponatremia: Related to hypovolemia poor oral intake. Resolved With IV fluids.  Hypernatremia;  Resolved with fluids.    3-Hypokalemia: Resolved.   4-A. Fib: Per GI from last admission ok to resume  Xarelto 1/22. We were holding xarelto because hb was trending down.  TEE showed, Left Atrium appendage thrombus.  Hb has remain stable 7--8. He denies black stool. Last episode was one week ago.  Discussed option for heparin Gtt and then transition to eliquis. Patient would like opinion from his cardiologist.  Evaluated by cardiologist who agrees with Heparin. Monitor hb. Hb remain stable.   HFrEF;  Echo 2021 ejection fraction 40 to 45% Monitor volume status Continue to hold IV Lasix  Nausea and vomiting; He denies abdominal pain. KUB negative for bowel obstruction Supportive care. Improved.   Elevated troponin: Most likely demand ischemia and worsening renal function. Denies chest pain  Generalized weakness: PT eval  Hypertension: Continue with Norvasc and Metoprolol  Covid positive, tested 01/20/2020 Completed a course of rhythm severe and IV steroid Per Dr Baxter Flattery, ok to discontinue isolation.      Estimated body mass index is 28.54 kg/m as calculated from the following:   Height as of this encounter: 5\' 6"  (1.676 m).   Weight as of this encounter: 80.2 kg.   DVT prophylaxis: Scd Code Status: Full code Family Communication: Care  discussed with patient  Disposition Plan:  Status is: Inpatient  Remains inpatient appropriate because:Persistent severe electrolyte disturbances   Dispo: The patient is from: Home              Anticipated d/c is to: Home  Anticipated d/c date is: 3 days              Patient currently is not medically stable to d/c.   Difficult to place patient No        Consultants:   Nephrology,.   Procedures:  none Antimicrobials:  Ceftriaxone   Subjective: He is feeling well, denies chest pain.    Objective: Vitals:   02/07/20 0915 02/07/20 1550 02/07/20 1956 02/08/20 0429  BP: (!) 146/58 104/71 (!) 139/57 (!) 122/55  Pulse: 60 68  62 76  Resp: 18 18  17   Temp: 98.7 F (37.1 C) 97.6 F (36.4 C) 98.2 F (36.8 C) 97.7 F (36.5 C)  TempSrc: Oral Oral Oral Oral  SpO2: 98% 95% 97% 96%  Weight:      Height:        Intake/Output Summary (Last 24 hours) at 02/08/2020 1349 Last data filed at 02/08/2020 1300 Gross per 24 hour  Intake 417.32 ml  Output 151 ml  Net 266.32 ml   Filed Weights   02/02/20 1952  Weight: 80.2 kg    Examination:  General exam: NAD Respiratory system: CTA Cardiovascular system: S 1, S 2 IRR Gastrointestinal system: BS present, soft,  Central nervous system: alert and orienetd Extremities: No edema    Data Reviewed: I have personally reviewed following labs and imaging studies  CBC: Recent Labs  Lab 02/01/20 1618 02/02/20 0055 02/04/20 0213 02/05/20 0834 02/06/20 0805 02/07/20 0322 02/08/20 0151  WBC 16.8*   < > 12.6* 10.7* 11.8* 10.5 10.3  NEUTROABS 14.5*  --   --   --   --   --   --   HGB 9.2*   < > 7.5* 8.0* 7.8* 7.0* 7.4*  HCT 31.2*   < > 25.6* 27.2* 26.3* 24.7* 24.1*  MCV 84.1   < > 85.9 84.7 84.3 85.5 82.3  PLT 249   < > 178 169 200 161 158   < > = values in this interval not displayed.   Basic Metabolic Panel: Recent Labs  Lab 02/02/20 0055 02/03/20 0239 02/03/20 1450 02/04/20 0213 02/05/20 0239 02/06/20 0146 02/07/20 0322 02/08/20 0151  NA 150* 145 147* 146* 145 143 145 141  K 3.0* 2.9* 3.3* 3.8 3.4* 4.0 3.6 3.7  CL 117* 112* 113* 113* 114* 113* 115* 107  CO2 23 23 22  20* 22 18* 21* 25  GLUCOSE 155* 157* 143* 116* 102* 109* 97 94  BUN 65* 58* 58* 55* 52* 49* 45* 26*  CREATININE 4.55* 4.47* 4.56* 4.58* 4.44* 4.25* 4.47* 3.53*  CALCIUM 8.0* 7.8* 7.8* 7.8* 7.9* 7.7* 7.8* 7.7*  MG 2.1  --   --   --   --   --   --   --   PHOS  --  2.5 2.6 3.0 3.2 2.9  --   --    GFR: Estimated Creatinine Clearance: 16.3 mL/min (A) (by C-G formula based on SCr of 3.53 mg/dL (H)). Liver Function Tests: Recent Labs  Lab 02/01/20 1618 02/02/20 0055 02/03/20 0239  02/03/20 1450 02/04/20 0213 02/05/20 0239 02/06/20 0146  AST 28 28  --   --   --   --   --   ALT 24 22  --   --   --   --   --   ALKPHOS 126 109  --   --   --   --   --   BILITOT 0.8 0.9  --   --   --   --   --  PROT 5.9* 5.3*  --   --   --   --   --   ALBUMIN 2.4* 2.2* 1.9* 2.0* 1.8* 1.8* 1.7*   No results for input(s): LIPASE, AMYLASE in the last 168 hours. No results for input(s): AMMONIA in the last 168 hours. Coagulation Profile: No results for input(s): INR, PROTIME in the last 168 hours. Cardiac Enzymes: No results for input(s): CKTOTAL, CKMB, CKMBINDEX, TROPONINI in the last 168 hours. BNP (last 3 results) No results for input(s): PROBNP in the last 8760 hours. HbA1C: No results for input(s): HGBA1C in the last 72 hours. CBG: Recent Labs  Lab 02/07/20 1726 02/07/20 2332 02/07/20 2336 02/08/20 0738 02/08/20 1122  GLUCAP 105* 94 87 81 123*   Lipid Profile: No results for input(s): CHOL, HDL, LDLCALC, TRIG, CHOLHDL, LDLDIRECT in the last 72 hours. Thyroid Function Tests: No results for input(s): TSH, T4TOTAL, FREET4, T3FREE, THYROIDAB in the last 72 hours. Anemia Panel: No results for input(s): VITAMINB12, FOLATE, FERRITIN, TIBC, IRON, RETICCTPCT in the last 72 hours. Sepsis Labs: Recent Labs  Lab 02/02/20 0055 02/03/20 0239  PROCALCITON 0.36 0.45    Recent Results (from the past 240 hour(s))  Urine culture     Status: Abnormal   Collection Time: 02/01/20  5:13 PM   Specimen: Urine, Random  Result Value Ref Range Status   Specimen Description URINE, RANDOM  Final   Special Requests   Final    NONE Performed at [facility], [address], [place], [place] [address]    Culture >=100,000 COLONIES/mL STAPHYLOCOCCUS AUREUS (A)  Final   Report Status 02/05/2020 FINAL  Final   Organism ID, Bacteria STAPHYLOCOCCUS AUREUS (A)  Final      Susceptibility   Staphylococcus aureus - MIC*    CIPROFLOXACIN <=0.5 SENSITIVE Sensitive     GENTAMICIN <=0.5  SENSITIVE Sensitive     NITROFURANTOIN <=16 SENSITIVE Sensitive     OXACILLIN <=0.25 SENSITIVE Sensitive     TETRACYCLINE <=1 SENSITIVE Sensitive     VANCOMYCIN <=0.5 SENSITIVE Sensitive     TRIMETH/SULFA <=10 SENSITIVE Sensitive     CLINDAMYCIN <=0.25 SENSITIVE Sensitive     RIFAMPIN <=0.5 SENSITIVE Sensitive     Inducible Clindamycin NEGATIVE Sensitive     * >=100,000 COLONIES/mL STAPHYLOCOCCUS AUREUS  Culture, blood (Routine X 2) w Reflex to ID Panel     Status: None (Preliminary result)   Collection Time: 02/04/20  4:40 PM   Specimen: BLOOD  Result Value Ref Range Status   Specimen Description BLOOD RIGHT ANTECUBITAL  Final   Special Requests   Final    BOTTLES DRAWN AEROBIC ONLY Blood Culture adequate volume   Culture   Final    NO GROWTH 4 DAYS Performed at [facility], [address], [place], [place] [address]    Report Status PENDING  Incomplete  Culture, blood (Routine X 2) w Reflex to ID Panel     Status: None (Preliminary result)   Collection Time: 02/04/20  4:46 PM   Specimen: BLOOD  Result Value Ref Range Status   Specimen Description BLOOD RIGHT ANTECUBITAL  Final   Special Requests   Final    BOTTLES DRAWN AEROBIC ONLY Blood Culture adequate volume   Culture   Final    NO GROWTH 4 DAYS Performed at [facility], [place] [address], [place], [place] [address]    Report Status PENDING  Incomplete         Radiology Studies: No results  found.      Scheduled Meds: . amLODipine  2.5 mg Oral Daily  . atorvastatin  20 mg Oral Daily  . Chlorhexidine Gluconate Cloth  6 each Topical Q0600  . insulin aspart  0-6 Units Subcutaneous TID WC  . metoprolol succinate  25 mg Oral Daily  . pantoprazole  40 mg Oral BID  . sodium bicarbonate  650 mg Oral TID  . sodium chloride flush  10-40 mL Intracatheter Q12H  . tamsulosin  0.4 mg Oral Daily   Continuous Infusions: . sodium chloride    . sodium chloride    . sodium chloride 10 mL/hr at 02/06/20 1126   . [START ON 02/09/2020]  ceFAZolin (ANCEF) IV    . heparin 950 Units/hr (02/08/20 0636)     LOS: 6 days    Time spent: 35 minutes    Zinnia Tindall A Jenell Dobransky, MD [facility]   If 7PM-7AM, please contact night-coverage [email or web address]  02/08/2020, 1:49 PM

## 2020-02-08 NOTE — Progress Notes (Signed)
ANTICOAGULATION CONSULT NOTE - Initial Consult  Pharmacy Consult for heparin Indication: atrial fibrillation and atrial thrombus  Allergies  Allergen Reactions  . Simvastatin Other (See Comments)    Abdominal cramps  . Nitrostat [Nitroglycerin] Other (See Comments)    Causes blood pressure to "bottom out"    Patient Measurements: Height: 5\' 6"  (167.6 cm) Weight: 80.2 kg (176 lb 12.9 oz) IBW/kg (Calculated) : 63.8 Heparin Dosing Weight: 80 kg  Vital Signs: Temp: 97.7 F (36.5 C) (01/30 0429) Temp Source: Oral (01/30 0429) BP: 122/55 (01/30 0429) Pulse Rate: 76 (01/30 0429)  Labs: Recent Labs    02/06/20 0146 02/06/20 0805 02/07/20 0322 02/07/20 0962 02/07/20 1638 02/08/20 0151  HGB  --  7.8* 7.0*  --   --  7.4*  HCT  --  26.3* 24.7*  --   --  24.1*  PLT  --  200 161  --   --  158  HEPARINUNFRC  --   --   --  0.73* 0.58 0.40  CREATININE 4.25*  --  4.47*  --   --  3.53*    Estimated Creatinine Clearance: 16.3 mL/min (A) (by C-G formula based on SCr of 3.53 mg/dL (H)).   Assessment: Pt was previously on Xarelto for his AF but this was held early Jan to 01/31/20 for GIB. 1/28 TTE showed LA thrombus. Pharmacy consulted for IV heparin.    HL 0.4 this morning is at goal. H/H ~7, plt stable. Planned RUE AVG next week.   Goal of Therapy:  Heparin level 0.3-0.5 units/ml (Decreased for recent GIB) Monitor platelets by anticoagulation protocol: Yes   Plan:  Continue heparin to 950 units/hr Monitor daily HL, CBC/plt Monitor for signs/symptoms of bleeding  F/u DOAC plan   Alfonse Spruce, PharmD PGY2 ID Pharmacy Resident Phone between 7 am - 3:30 pm: [phone]  Please check AMION for all [place] phone numbers After 10:00 PM, call [place] [phone]

## 2020-02-09 ENCOUNTER — Inpatient Hospital Stay (HOSPITAL_COMMUNITY): Payer: Medicare Other | Admitting: Anesthesiology

## 2020-02-09 ENCOUNTER — Inpatient Hospital Stay (HOSPITAL_COMMUNITY): Payer: Medicare Other

## 2020-02-09 ENCOUNTER — Encounter (HOSPITAL_COMMUNITY)
Admission: EM | Disposition: A | Discharge status: Home Health | Payer: Self-pay | Source: Home / Self Care | Attending: Internal Medicine

## 2020-02-09 ENCOUNTER — Encounter (HOSPITAL_COMMUNITY): Payer: Self-pay | Admitting: Family Medicine

## 2020-02-09 DIAGNOSIS — N179 Acute kidney failure, unspecified: Secondary | ICD-10-CM

## 2020-02-09 DIAGNOSIS — N185 Chronic kidney disease, stage 5: Secondary | ICD-10-CM

## 2020-02-09 HISTORY — PX: EXCHANGE OF A DIALYSIS CATHETER: SHX5818

## 2020-02-09 HISTORY — PX: AV FISTULA PLACEMENT: SHX1204

## 2020-02-09 LAB — CULTURE, BLOOD (ROUTINE X 2)
Culture: NO GROWTH
Culture: NO GROWTH
Special Requests: ADEQUATE
Special Requests: ADEQUATE

## 2020-02-09 LAB — BASIC METABOLIC PANEL
Anion gap: 11 (ref 5–15)
BUN: 28 mg/dL — ABNORMAL HIGH (ref 8–23)
CO2: 24 mmol/L (ref 22–32)
Calcium: 8 mg/dL — ABNORMAL LOW (ref 8.9–10.3)
Chloride: 108 mmol/L (ref 98–111)
Creatinine, Ser: 3.82 mg/dL — ABNORMAL HIGH (ref 0.61–1.24)
GFR, Estimated: 15 mL/min — ABNORMAL LOW (ref >60)
Glucose, Bld: 107 mg/dL — ABNORMAL HIGH (ref 70–99)
Potassium: 3.9 mmol/L (ref 3.5–5.1)
Sodium: 143 mmol/L (ref 135–145)

## 2020-02-09 LAB — CBC
HCT: 23.7 % — ABNORMAL LOW (ref 39.0–52.0)
Hemoglobin: 7.2 g/dL — ABNORMAL LOW (ref 13.0–17.0)
MCH: 25.7 pg — ABNORMAL LOW (ref 26.0–34.0)
MCHC: 30.4 g/dL (ref 30.0–36.0)
MCV: 84.6 fL (ref 80.0–100.0)
Platelets: 163 10*3/uL (ref 150–400)
RBC: 2.8 MIL/uL — ABNORMAL LOW (ref 4.22–5.81)
RDW: 20.1 % — ABNORMAL HIGH (ref 11.5–15.5)
WBC: 11.1 10*3/uL — ABNORMAL HIGH (ref 4.0–10.5)
nRBC: 0 % (ref 0.0–0.2)

## 2020-02-09 LAB — GLUCOSE, CAPILLARY
Glucose-Capillary: 93 mg/dL (ref 70–99)
Glucose-Capillary: 95 mg/dL (ref 70–99)
Glucose-Capillary: 113 mg/dL — ABNORMAL HIGH (ref 70–99)
Glucose-Capillary: 91 mg/dL (ref 70–99)

## 2020-02-09 LAB — SURGICAL PCR SCREEN
MRSA, PCR: NEGATIVE
Staphylococcus aureus: POSITIVE — AB

## 2020-02-09 LAB — PROTIME-INR
INR: 1.6 — ABNORMAL HIGH (ref 0.8–1.2)
Prothrombin Time: 18.1 seconds — ABNORMAL HIGH (ref 11.4–15.2)

## 2020-02-09 LAB — HEPARIN LEVEL (UNFRACTIONATED): Heparin Unfractionated: 0.34 IU/mL (ref 0.30–0.70)

## 2020-02-09 LAB — PREPARE RBC (CROSSMATCH)

## 2020-02-09 SURGERY — ARTERIOVENOUS (AV) FISTULA CREATION
Anesthesia: Monitor Anesthesia Care | Site: Chest | Laterality: Right

## 2020-02-09 MED ORDER — SODIUM CHLORIDE 0.9 % IV SOLN
INTRAVENOUS | Status: DC | PRN
  Administered 2020-02-09: 500 mL

## 2020-02-09 MED ORDER — PHENYLEPHRINE 40 MCG/ML (10ML) SYRINGE FOR IV PUSH (FOR BLOOD PRESSURE SUPPORT)
PREFILLED_SYRINGE | INTRAVENOUS | Status: AC
  Filled 2020-02-09: qty 10

## 2020-02-09 MED ORDER — HEMOSTATIC AGENTS (NO CHARGE) OPTIME
TOPICAL | Status: DC | PRN
  Administered 2020-02-09: 1 via TOPICAL

## 2020-02-09 MED ORDER — SODIUM CHLORIDE 0.9% IV SOLUTION: Freq: Once | INTRAVENOUS | Status: DC

## 2020-02-09 MED ORDER — 0.9 % SODIUM CHLORIDE (POUR BTL) OPTIME
TOPICAL | Status: DC | PRN
  Administered 2020-02-09: 1000 mL

## 2020-02-09 MED ORDER — HEPARIN SODIUM (PORCINE) 1000 UNIT/ML IJ SOLN
INTRAMUSCULAR | Status: DC | PRN
  Administered 2020-02-09: 3200 [IU] via INTRAVENOUS

## 2020-02-09 MED ORDER — ONDANSETRON HCL 4 MG/2ML IJ SOLN
INTRAMUSCULAR | Status: DC | PRN
  Administered 2020-02-09: 4 mg via INTRAVENOUS

## 2020-02-09 MED ORDER — LIDOCAINE HCL (PF) 1 % IJ SOLN
INTRAMUSCULAR | Status: AC
  Filled 2020-02-09: qty 60

## 2020-02-09 MED ORDER — PROPOFOL 500 MG/50ML IV EMUL
INTRAVENOUS | Status: DC | PRN
  Administered 2020-02-09: 100 ug/kg/min via INTRAVENOUS

## 2020-02-09 MED ORDER — ONDANSETRON HCL 4 MG/2ML IJ SOLN
INTRAMUSCULAR | Status: AC
  Filled 2020-02-09: qty 2

## 2020-02-09 MED ORDER — CHLORHEXIDINE GLUCONATE CLOTH 2 % EX PADS
6.0000 | MEDICATED_PAD | Freq: Every day | CUTANEOUS | Status: DC
  Administered 2020-02-10 – 2020-02-12 (×3): 6 via TOPICAL

## 2020-02-09 MED ORDER — MIDAZOLAM HCL 2 MG/2ML IJ SOLN
INTRAMUSCULAR | Status: AC
  Filled 2020-02-09: qty 2

## 2020-02-09 MED ORDER — ONDANSETRON HCL 4 MG/2ML IJ SOLN: 4.0000 mg | Freq: Once | INTRAMUSCULAR | Status: DC | PRN

## 2020-02-09 MED ORDER — ACETAMINOPHEN 10 MG/ML IV SOLN: 1000.0000 mg | Freq: Once | INTRAVENOUS | Status: DC | PRN

## 2020-02-09 MED ORDER — HEPARIN SODIUM (PORCINE) 1000 UNIT/ML IJ SOLN
INTRAMUSCULAR | Status: AC
  Filled 2020-02-09: qty 1

## 2020-02-09 MED ORDER — FENTANYL CITRATE (PF) 250 MCG/5ML IJ SOLN
INTRAMUSCULAR | Status: AC
  Filled 2020-02-09: qty 5

## 2020-02-09 MED ORDER — PHENYLEPHRINE HCL-NACL 10-0.9 MG/250ML-% IV SOLN
INTRAVENOUS | Status: DC | PRN
  Administered 2020-02-09: 20 ug/min via INTRAVENOUS

## 2020-02-09 MED ORDER — FENTANYL CITRATE (PF) 100 MCG/2ML IJ SOLN: 25.0000 ug | INTRAMUSCULAR | Status: DC | PRN

## 2020-02-09 MED ORDER — SORBITOL 70 % SOLN
30.0000 mL | Freq: Once | Status: AC
  Administered 2020-02-09: 30 mL via ORAL
  Filled 2020-02-09: qty 30

## 2020-02-09 MED ORDER — MUPIROCIN 2 % EX OINT
1.0000 "application " | TOPICAL_OINTMENT | Freq: Two times a day (BID) | CUTANEOUS | Status: DC
  Administered 2020-02-09 – 2020-02-12 (×6): 1 via NASAL
  Filled 2020-02-09: qty 22

## 2020-02-09 MED ORDER — SODIUM CHLORIDE 0.9 % IV SOLN
125.0000 mg | Freq: Every day | INTRAVENOUS | Status: DC
  Administered 2020-02-09 – 2020-02-12 (×4): 125 mg via INTRAVENOUS
  Filled 2020-02-09 (×6): qty 10

## 2020-02-09 MED ORDER — CHLORHEXIDINE GLUCONATE 0.12 % MT SOLN
OROMUCOSAL | Status: AC
  Filled 2020-02-09: qty 15

## 2020-02-09 MED ORDER — FENTANYL CITRATE (PF) 100 MCG/2ML IJ SOLN
INTRAMUSCULAR | Status: AC
  Filled 2020-02-09: qty 2

## 2020-02-09 MED ORDER — PHENYLEPHRINE 40 MCG/ML (10ML) SYRINGE FOR IV PUSH (FOR BLOOD PRESSURE SUPPORT)
PREFILLED_SYRINGE | INTRAVENOUS | Status: DC | PRN
  Administered 2020-02-09: 80 ug via INTRAVENOUS

## 2020-02-09 MED ORDER — FENTANYL CITRATE (PF) 250 MCG/5ML IJ SOLN
INTRAMUSCULAR | Status: DC | PRN
  Administered 2020-02-09: 25 ug via INTRAVENOUS

## 2020-02-09 MED ORDER — LIDOCAINE HCL 1 % IJ SOLN
INTRAMUSCULAR | Status: DC | PRN
  Administered 2020-02-09: 9 mL

## 2020-02-09 SURGICAL SUPPLY — 38 items
ARMBAND PINK RESTRICT EXTREMIT (MISCELLANEOUS) ×6 IMPLANT
BIOPATCH RED 1 DISK 7.0 (GAUZE/BANDAGES/DRESSINGS) ×3 IMPLANT
BLADE CLIPPER SURG (BLADE) ×3 IMPLANT
CANISTER SUCT 3000ML PPV (MISCELLANEOUS) ×3 IMPLANT
CATH PALINDROME-P 19CM W/VT (CATHETERS) ×3 IMPLANT
CLIP VESOCCLUDE MED 6/CT (CLIP) ×3 IMPLANT
CLIP VESOCCLUDE SM WIDE 6/CT (CLIP) ×3 IMPLANT
COVER PROBE W GEL 5X96 (DRAPES) ×3 IMPLANT
DECANTER SPIKE VIAL GLASS SM (MISCELLANEOUS) ×3 IMPLANT
DERMABOND ADVANCED (GAUZE/BANDAGES/DRESSINGS) ×1
DERMABOND ADVANCED .7 DNX12 (GAUZE/BANDAGES/DRESSINGS) ×2 IMPLANT
ELECT REM PT RETURN 9FT ADLT (ELECTROSURGICAL) ×3
ELECTRODE REM PT RTRN 9FT ADLT (ELECTROSURGICAL) ×2 IMPLANT
GAUZE PACKING FOLDED 2  STR (GAUZE/BANDAGES/DRESSINGS) ×3
GAUZE PACKING FOLDED 2 STR (GAUZE/BANDAGES/DRESSINGS) ×2 IMPLANT
GLOVE BIO SURGEON STRL SZ7.5 (GLOVE) ×3 IMPLANT
GLOVE SRG 8 PF TXTR STRL LF DI (GLOVE) ×2 IMPLANT
GLOVE SURG UNDER POLY LF SZ8 (GLOVE) ×3
GOWN STRL REUS W/ TWL LRG LVL3 (GOWN DISPOSABLE) ×4 IMPLANT
GOWN STRL REUS W/ TWL XL LVL3 (GOWN DISPOSABLE) ×4 IMPLANT
GOWN STRL REUS W/TWL LRG LVL3 (GOWN DISPOSABLE) ×6
GOWN STRL REUS W/TWL XL LVL3 (GOWN DISPOSABLE) ×6
HEMOSTAT SPONGE AVITENE ULTRA (HEMOSTASIS) IMPLANT
HEMOSTAT SURGICEL 2X14 (HEMOSTASIS) ×3 IMPLANT
KIT BASIN OR (CUSTOM PROCEDURE TRAY) ×3 IMPLANT
KIT TURNOVER KIT B (KITS) ×3 IMPLANT
NS IRRIG 1000ML POUR BTL (IV SOLUTION) ×3 IMPLANT
PACK CV ACCESS (CUSTOM PROCEDURE TRAY) ×3 IMPLANT
PAD ARMBOARD 7.5X6 YLW CONV (MISCELLANEOUS) ×6 IMPLANT
SUT MNCRL AB 4-0 PS2 18 (SUTURE) ×6 IMPLANT
SUT PROLENE 6 0 BV (SUTURE) ×12 IMPLANT
SUT PROLENE 7 0 BV 1 (SUTURE) IMPLANT
SUT VIC AB 3-0 SH 27 (SUTURE) ×3
SUT VIC AB 3-0 SH 27X BRD (SUTURE) ×2 IMPLANT
TAPE CLOTH SURG 4X10 WHT LF (GAUZE/BANDAGES/DRESSINGS) ×3 IMPLANT
TOWEL GREEN STERILE (TOWEL DISPOSABLE) ×3 IMPLANT
UNDERPAD 30X36 HEAVY ABSORB (UNDERPADS AND DIAPERS) ×3 IMPLANT
WATER STERILE IRR 1000ML POUR (IV SOLUTION) ×3 IMPLANT

## 2020-02-09 NOTE — Anesthesia Preprocedure Evaluation (Addendum)
Anesthesia Evaluation  Patient identified by MRN, date of birth, ID band Patient awake    Reviewed: H&P , NPO status , Patient's Chart, lab work & pertinent test results  Airway Mallampati: II  TM Distance: >3 FB     Dental  (+) Upper Dentures, Lower Dentures   Pulmonary sleep apnea and Continuous Positive Airway Pressure Ventilation , former smoker,    Pulmonary exam normal        Cardiovascular hypertension, Pt. on medications and Pt. on home beta blockers + CAD, + CABG, +CHF and + DVT  + dysrhythmias Atrial Fibrillation + Cardiac Defibrillator  Rhythm:Regular Rate:Normal     Neuro/Psych negative neurological ROS  negative psych ROS   GI/Hepatic Neg liver ROS, GERD  Medicated,  Endo/Other  negative endocrine ROS  Renal/GU CRFRenal disease (CKD III)   BPH    Musculoskeletal  (+) Arthritis , Osteoarthritis,    Abdominal (+)  Abdomen: soft. Bowel sounds: normal.  Peds  Hematology  (+) Blood dyscrasia, anemia ,   Anesthesia Other Findings   Reproductive/Obstetrics negative OB ROS                            Anesthesia Physical  Anesthesia Plan  ASA: III  Anesthesia Plan: MAC   Post-op Pain Management:    Induction: Intravenous  PONV Risk Score and Plan: 1 and Propofol infusion, Treatment may vary due to age or medical condition and Ondansetron  Airway Management Planned: Nasal Cannula, Natural Airway and Simple Face Mask  Additional Equipment: None  Intra-op Plan:   Post-operative Plan:   Informed Consent:     Dental advisory given  Plan Discussed with: CRNA  Anesthesia Plan Comments: (Lab Results      Component                Value               Date                      WBC                      11.1 (H)            02/09/2020                HGB                      7.2 (L)             02/09/2020                HCT                      23.7 (L)             02/09/2020                MCV                      84.6                02/09/2020                PLT                      163  02/09/2020           Lab Results      Component                Value               Date                      NA                       143                 02/09/2020                K                        3.9                 02/09/2020                CO2                      24                  02/09/2020                GLUCOSE                  107 (H)             02/09/2020                BUN                      28 (H)              02/09/2020                CREATININE               3.82 (H)            02/09/2020                CALCIUM                  8.0 (L)             02/09/2020                GFRNONAA                 15 (L)              02/09/2020                GFRAA                    20 (L)              08/12/2019           ECHO 02/06/20: Global hypokinesis worse in the septum. Left ventricular ejection fraction, by estimation, is 40 to 45%. The left ventricle has mildly decreased function. The left ventricle demonstrates global hypokinesis. Left ventricular diastolic function could not be evaluated. 2. Right ventricular systolic function is mildly reduced. The right ventricular size is normal. 3. Thrombus in the left atrial appendage measuring 1.5 x 0.6 cm. Left atrial size was mildly dilated. A left  atrial/left atrial appendage thrombus was detected. The LAA emptying velocity was 47 cm/s. 4. Pacemaker wire. 5. The mitral valve is normal in structure. Mild mitral valve regurgitation. No evidence of mitral stenosis. 6. Tricuspid valve regurgitation is mild to moderate. 7. The aortic valve is tricuspid. Aortic valve regurgitation is not visualized. No aortic stenosis is present. 8. There is mild (Grade II) atheroma plaque involving the descending aorta. 9. The inferior vena cava is normal in size with greater than 50% respiratory  variability, suggesting right atrial pressure of 3 mmHg. 10. Evidence of atrial level shunting detected by color flow Doppler. There is a small patent foramen ovale with predominantly left to right shunting across the atrial septum.)       Anesthesia Quick Evaluation

## 2020-02-09 NOTE — Anesthesia Procedure Notes (Signed)
Procedure Name: MAC Date/Time: 02/09/2020 1:29 PM Performed by: Dorthea Cove, CRNA Pre-anesthesia Checklist: Patient identified, Emergency Drugs available, Suction available, Patient being monitored and Timeout performed Patient Re-evaluated:Patient Re-evaluated prior to induction Oxygen Delivery Method: Simple face mask Dental Injury: Teeth and Oropharynx as per pre-operative assessment

## 2020-02-09 NOTE — Care Management (Signed)
From home with family and [facility] for Home health.   Entered benefit check for apixaban 10mg  twice daily followed by 5mg  twice daily  Can provide 30 day free card, patient can use once.   Magdalen Spatz RN

## 2020-02-09 NOTE — Transfer of Care (Signed)
Immediate Anesthesia Transfer of Care Note  Patient: Paul Price  Procedure(s) Performed: ARTERIOVENOUS (AV) FISTULA  CREATION RIGHT UPPER EXTREMITY (Right Arm Upper) EXCHANGE OF A DIALYSIS CATHETER (Right Chest)  Patient Location: PACU  Anesthesia Type:MAC  Level of Consciousness: drowsy  Airway & Oxygen Therapy: Patient Spontanous Breathing and Patient connected to face mask oxygen  Post-op Assessment: Report given to RN and Post -op Vital signs reviewed and stable  Post vital signs: Reviewed and stable  Last Vitals:  Vitals Value Taken Time  BP 149/56 02/09/20 1540  Temp    Pulse 63 02/09/20 1542  Resp 15 02/09/20 1542  SpO2 98 % 02/09/20 1542  Vitals shown include unvalidated device data.  Last Pain:  Vitals:   02/09/20 0433  TempSrc: Oral  PainSc:          Complications: No complications documented.

## 2020-02-09 NOTE — TOC Transition Note (Signed)
Transition of Care [facility]) - CM/SW Discharge Note   Patient Details  Name: Paul Price MRN: [id number] Date of Birth: 07/20/1940  Transition of Care [facility]) CM/SW Contact:  Kerin Salen Phone Number: 02/09/2020, 12:34 PM   Clinical Narrative:         Barriers to Discharge: Continued Medical Work up   Patient Goals and CMS Choice Patient states their goals for this hospitalization and ongoing recovery are:: to go home      Discharge Placement                       Discharge Plan and Services   Discharge Planning Services: CM Consult Post Acute Care Choice: Home Health                    HH Arranged: PT,OT,RN [place] Date [place]: 02/03/20 Time [place]: 1117 Representative spoke with at [place]: Amy  Social Determinants of Health ([place]) Interventions     Readmission Risk Interventions No flowsheet data found.

## 2020-02-09 NOTE — Progress Notes (Signed)
[facility]  Subjective  - No complaints.   Objective (!) 145/77 70 97.7 F (36.5 C) (Oral) 17 97%  Intake/Output Summary (Last 24 hours) at 02/09/2020 1300 Last data filed at 02/09/2020 0900 Gross per 24 hour  Intake 300 ml  Output 450 ml  Net -150 ml    RIJ temporary dialysis catheter Right radial and brachial pulse palpable  Laboratory Lab Results: Recent Labs    02/08/20 0151 02/09/20 0420  WBC 10.3 11.1*  HGB 7.4* 7.2*  HCT 24.1* 23.7*  PLT 158 163   BMET Recent Labs    02/08/20 0151 02/09/20 0420  NA 141 143  K 3.7 3.9  CL 107 108  CO2 25 24  GLUCOSE 94 107*  BUN 26* 28*  CREATININE 3.53* 3.82*  CALCIUM 7.7* 8.0*    COAG Lab Results  Component Value Date   INR 1.6 (H) 02/09/2020   INR 2.6 (H) 11/14/2019   INR 1.8 (H) 03/21/2019   No results found for: PTT  Assessment/Planning:  80 year old male that vascular surgery was consulted for new dialysis access.  Discussed plan for exchange of right IJ temporary catheter for tunneled dialysis catheter and placement of right arm AV fistula versus graft.  Would likely try and use the basilic vein even though it is marginal given all of his recent infections.  Discussed being done in two stages.  Risks and benefits discussed.    Marty Heck 02/09/2020 1:00 PM --

## 2020-02-09 NOTE — Progress Notes (Signed)
PROGRESS NOTE    Paul Price  GBT:[id number] DOB: 12-18-1940 DOA: 02/01/2020 PCP: Paul Pretty, MD   Brief Narrative: 80 year old with past medical history significant for paroxysmal A. fib, chronic kidney disease, hypertension, hyperlipidemia, BPH who presented to the ED complaining of shortness of breath and nausea.  Patient was recently discharged on 01/26/2020 after admission for upper GI bleed, AKI and Covid pneumonia.  Patient finished a course of breath then severe and is steroids.  He did not have oxygen requirement at that time.  Patient was evaluated by GI at that time and advised to hold Xarelto until 01/31/2020.  Patient did not resume Xarelto at home.  Patient was discharged with a creatinine of 3.7.  He presents with nausea vomiting, unable to keep anything down.  Patient admitted with poor oral intake, dehydration, hyponatremia, nausea and AKI.    Assessment & Plan:   Principal Problem:   AKI (acute kidney injury) ([place]) Active Problems:   HTN (hypertension)   Hyperlipidemia with target LDL less than 70   OSA on CPAP   PAF (paroxysmal atrial fibrillation) (HCC)   COVID-19 virus infection   Intractable nausea and vomiting   Hypernatremia   Elevated troponin  1-AKI on CKD stage IV: Recent creatinine at discharge was 3.7.  Presented with a creatinine of 4.5 Continue to hold Lasix. Appreciate nephrology evaluation.  Poor oral intake. Cr at 4.4.  Nephrology planning trial of HD> for HD catheter placement 1/27. VA graft  Today . Plan to convert HD tunneled catheter on 1-31 Had first HD 1/28   2-Staph Aureus in urine.  UTI: urine culture grew  Staph aureus.  Completed cefazolin 6 days.  TEE negative for endocarditis.  Appreciate ID recommendations.  Blood culture obtain 1/26: No growth to date,.   3-Anemia; iron deficiency;  Received IV iron this admission.  Hb at 7.4. plan to transfuse one unit, discussed with Paul Price.   Paul Price. Fib;  Left Atrium Appendage  Thrombus:  Per GI from last admission ok to resume  Xarelto 1/22. We were holding xarelto because hb was trending down.  TEE showed, Left Atrium appendage thrombus.  Hb has remain stable 7--8. He denies black stool. Last episode was one week ago.  Evaluated by cardiologist who agrees with Heparin. Monitor hb. Hb remain stable.  Plan to hold heparin for surgery.   HFrEF;  Echo 2021 ejection fraction 40 to 45% Monitor volume status Continue to hold IV Lasix  Nausea and vomiting; He denies abdominal pain. KUB negative for bowel obstruction Supportive care. Improved.   Elevated troponin: Most likely demand ischemia and worsening renal function. Denies chest pain.  Generalized weakness: PT eval.  Hypertension: Continue with Norvasc and Metoprolol.  Hyponatremia: Related to hypovolemia poor oral intake. Resolved With IV fluids.  Hypernatremia;  Resolved with fluids.   3-Hypokalemia: Resolved.   Covid positive, tested 01/20/2020 Completed a course of rhythm severe and IV steroid Per Paul Paul Price, ok to discontinue isolation.   Constipation; will give sorbitol.    Estimated body mass index is 28.54 kg/m as calculated from the following:   Height as of this encounter: 5\' 6"  (1.676 m).   Weight as of this encounter: 80.2 kg.   DVT prophylaxis: Scd Code Status: Full code Family Communication: Care  discussed with patient and wife who was at bedside Disposition Plan:  Status is: Inpatient  Remains inpatient appropriate because:Persistent severe electrolyte disturbances   Dispo: The patient is from: Home  Anticipated d/c is to: Home              Anticipated d/c date is: 3 days              Patient currently is not medically stable to d/c.   Difficult to place patient No        Consultants:   Nephrology,.   Procedures:  none Antimicrobials:  Ceftriaxone   Subjective: He is feeling better than last night. He was having problem with  constipation, and severe cramps last night.   He denies dyspnea.   Objective: Vitals:   02/08/20 1436 02/08/20 1955 02/08/20 2242 02/09/20 0433  BP: 126/70 138/60  (!) 145/77  Pulse: 74 62 67 70  Resp: 18 16 16 17   Temp: 98 F (36.7 C) 98.3 F (36.8 C)  97.7 F (36.5 C)  TempSrc: Oral Oral  Oral  SpO2: 95% 97% 97% 97%  Weight:      Height:        Intake/Output Summary (Last 24 hours) at 02/09/2020 1423 Last data filed at 02/09/2020 0900 Gross per 24 hour  Intake 120 ml  Output 450 ml  Net -330 ml   Filed Weights   02/02/20 1952  Weight: 80.2 kg    Examination:  General exam: NAD Respiratory system: CTA Cardiovascular system: S 1, S 2 IRR Gastrointestinal system: BS present,soft, nt Central nervous system: alert Extremities:  No edema    Data Reviewed: I have personally reviewed following labs and imaging studies  CBC: Recent Labs  Lab 02/05/20 0834 02/06/20 0805 02/07/20 0322 02/08/20 0151 02/09/20 0420  WBC 10.7* 11.8* 10.5 10.3 11.1*  HGB 8.0* 7.8* 7.0* 7.4* 7.2*  HCT 27.2* 26.3* 24.7* 24.1* 23.7*  MCV 84.7 84.3 85.5 82.3 84.6  PLT 169 200 161 158 194   Basic Metabolic Panel: Recent Labs  Lab 02/03/20 0239 02/03/20 1450 02/04/20 0213 02/05/20 0239 02/06/20 0146 02/07/20 0322 02/08/20 0151 02/09/20 0420  NA 145 147* 146* 145 143 145 141 143  K 2.9* 3.3* 3.8 3.4* 4.0 3.6 3.7 3.9  CL 112* 113* 113* 114* 113* 115* 107 108  CO2 23 22 20* 22 18* 21* 25 24  GLUCOSE 157* 143* 116* 102* 109* 97 94 107*  BUN 58* 58* 55* 52* 49* 45* 26* 28*  CREATININE 4.47* 4.56* 4.58* 4.44* 4.25* 4.47* 3.53* 3.82*  CALCIUM 7.8* 7.8* 7.8* 7.9* 7.7* 7.8* 7.7* 8.0*  PHOS 2.5 2.6 3.0 3.2 2.9  --   --   --    GFR: Estimated Creatinine Clearance: 15.1 mL/min (A) (by C-G formula based on SCr of 3.82 mg/dL (H)). Liver Function Tests: Recent Labs  Lab 02/03/20 0239 02/03/20 1450 02/04/20 0213 02/05/20 0239 02/06/20 0146  ALBUMIN 1.9* 2.0* 1.8* 1.8* 1.7*   No  results for input(s): LIPASE, AMYLASE in the last 168 hours. No results for input(s): AMMONIA in the last 168 hours. Coagulation Profile: Recent Labs  Lab 02/09/20 0420  INR 1.6*   Cardiac Enzymes: No results for input(s): CKTOTAL, CKMB, CKMBINDEX, TROPONINI in the last 168 hours. BNP (last 3 results) No results for input(s): PROBNP in the last 8760 hours. HbA1C: No results for input(s): HGBA1C in the last 72 hours. CBG: Recent Labs  Lab 02/08/20 1122 02/08/20 1715 02/08/20 2220 02/09/20 0831 02/09/20 1117  GLUCAP 123* 94 94 93 95   Lipid Profile: No results for input(s): CHOL, HDL, LDLCALC, TRIG, CHOLHDL, LDLDIRECT in the last 72 hours. Thyroid Function Tests: No results for input(s):  TSH, T4TOTAL, FREET4, T3FREE, THYROIDAB in the last 72 hours. Anemia Panel: No results for input(s): VITAMINB12, FOLATE, FERRITIN, TIBC, IRON, RETICCTPCT in the last 72 hours. Sepsis Labs: Recent Labs  Lab 02/03/20 0239  PROCALCITON 0.45    Recent Results (from the past 240 hour(s))  Urine culture     Status: Abnormal   Collection Time: 02/01/20  5:13 PM   Specimen: Urine, Random  Result Value Ref Range Status   Specimen Description URINE, RANDOM  Final   Special Requests   Final    NONE Performed at [facility], [address] [place], [place] [address]    Culture >=100,000 COLONIES/mL STAPHYLOCOCCUS AUREUS (A)  Final   Report Status 02/05/2020 FINAL  Final   Organism ID, Bacteria STAPHYLOCOCCUS AUREUS (A)  Final      Susceptibility   Staphylococcus aureus - MIC*    CIPROFLOXACIN <=0.5 SENSITIVE Sensitive     GENTAMICIN <=0.5 SENSITIVE Sensitive     NITROFURANTOIN <=16 SENSITIVE Sensitive     OXACILLIN <=0.25 SENSITIVE Sensitive     TETRACYCLINE <=1 SENSITIVE Sensitive     VANCOMYCIN <=0.5 SENSITIVE Sensitive     TRIMETH/SULFA <=10 SENSITIVE Sensitive     CLINDAMYCIN <=0.25 SENSITIVE Sensitive     RIFAMPIN <=0.5 SENSITIVE Sensitive     Inducible Clindamycin NEGATIVE  Sensitive     * >=100,000 COLONIES/mL STAPHYLOCOCCUS AUREUS  Culture, blood (Routine X 2) w Reflex to ID Panel     Status: None   Collection Time: 02/04/20  4:40 PM   Specimen: BLOOD  Result Value Ref Range Status   Specimen Description BLOOD RIGHT ANTECUBITAL  Final   Special Requests   Final    BOTTLES DRAWN AEROBIC ONLY Blood Culture adequate volume   Culture   Final    NO GROWTH 5 DAYS Performed at [facility], [place] [address] [place], [place] [address]    Report Status 02/09/2020 FINAL  Final  Culture, blood (Routine X 2) w Reflex to ID Panel     Status: None   Collection Time: 02/04/20  4:46 PM   Specimen: BLOOD  Result Value Ref Range Status   Specimen Description BLOOD RIGHT ANTECUBITAL  Final   Special Requests   Final    BOTTLES DRAWN AEROBIC ONLY Blood Culture adequate volume   Culture   Final    NO GROWTH 5 DAYS Performed at [facility], [place] [address], [place], [place] [address]    Report Status 02/09/2020 FINAL  Final  Surgical PCR screen     Status: Abnormal   Collection Time: 02/09/20  7:23 AM   Specimen: Nasal Mucosa; Nasal Swab  Result Value Ref Range Status   MRSA, PCR NEGATIVE NEGATIVE Final   Staphylococcus aureus POSITIVE (A) NEGATIVE Final    Comment: (NOTE) The Xpert SA Assay (FDA approved for NASAL specimens in patients 30 years of age and older), is one component of a comprehensive surveillance program. It is not intended to diagnose infection nor to guide or monitor treatment. Performed at [facility], [place] [address] [place], [place] [address]          Radiology Studies: HYBRID OR IMAGING ([place])  Result Date: 02/09/2020 There is no interpretation for this exam.  This order is for images obtained during a surgical procedure.  Please See "Surgeries" Tab for more information regarding the procedure.        Scheduled Meds: . [MAR Hold] sodium chloride   Intravenous  Once  . [MAR Hold] amLODipine  2.5 mg Oral  Daily  . [MAR Hold] atorvastatin  20 mg Oral Daily  . [MAR Hold] Chlorhexidine Gluconate Cloth  6 each Topical Q0600  . [MAR Hold] insulin aspart  0-6 Units Subcutaneous TID WC  . [MAR Hold] metoprolol succinate  25 mg Oral Daily  . [MAR Hold] pantoprazole  40 mg Oral BID  . [MAR Hold] sodium chloride flush  10-40 mL Intracatheter Q12H  . [MAR Hold] tamsulosin  0.4 mg Oral Daily   Continuous Infusions: . [MAR Hold] sodium chloride    . [MAR Hold] sodium chloride    . sodium chloride 10 mL/hr at 02/09/20 1323  . [MAR Hold] ferric gluconate (FERRLECIT/NULECIT) IV    . heparin 950 Units/hr (02/09/20 0621)     LOS: 7 days    Time spent: 35 minutes    Elya Diloreto A Aerion Bagdasarian, MD [facility]   If 7PM-7AM, please contact night-coverage [email or web address]  02/09/2020, 2:23 PM

## 2020-02-09 NOTE — Progress Notes (Signed)
Renal Navigator received notification from Dr. Goldsborough/Nephrologist that patient needs referral for outpatient HD for ESRD. Navigator attempted to reach patient/spouse in room, however, it appears he is being taken for permanent access surgery at this time. Navigator attempted to reach spouse on her cell phone number listed in Epic, but had to leave a message, requesting a call back when she is able.  Navigator submitted referral to [facility] to request seat at [place] [facility] and will wait to see if [place] can accommodate a new start at this time. Navigator following closely.   Alphonzo Cruise, [place] Renal Navigator [phone]

## 2020-02-09 NOTE — Progress Notes (Signed)
ANTICOAGULATION CONSULT NOTE - Initial Consult  Pharmacy Consult for heparin Indication: atrial fibrillation and atrial thrombus  Allergies  Allergen Reactions  . Simvastatin Other (See Comments)    Abdominal cramps  . Nitrostat [Nitroglycerin] Other (See Comments)    Causes blood pressure to "bottom out"    Patient Measurements: Height: 5\' 6"  (167.6 cm) Weight: 80.2 kg (176 lb 12.9 oz) IBW/kg (Calculated) : 63.8 Heparin Dosing Weight: 80 kg  Vital Signs: Temp: 97.7 F (36.5 C) (01/31 0433) Temp Source: Oral (01/31 0433) BP: 145/77 (01/31 0433) Pulse Rate: 70 (01/31 0433)  Labs: Recent Labs    02/07/20 0322 02/07/20 2505 02/07/20 1638 02/08/20 0151 02/09/20 0418 02/09/20 0420  HGB 7.0*  --   --  7.4*  --  7.2*  HCT 24.7*  --   --  24.1*  --  23.7*  PLT 161  --   --  158  --  163  LABPROT  --   --   --   --   --  18.1*  INR  --   --   --   --   --  1.6*  HEPARINUNFRC  --    < > 0.58 0.40 0.34  --   CREATININE 4.47*  --   --  3.53*  --  3.82*   < > = values in this interval not displayed.    Estimated Creatinine Clearance: 15.1 mL/min (A) (by C-G formula based on SCr of 3.82 mg/dL (H)).   Assessment: Pt was previously on Xarelto for his AF but this was held early Jan to 01/31/20 for GIB. 1/28 TTE showed LA thrombus. Pharmacy consulted for IV heparin.    Heparin level 0.34 is therapeutic on heparin 950 units/hr. Hgb 7.2 Plt 163. No reported bleeding. Planned RUE AVG today.   Goal of Therapy:  Heparin level 0.3-0.5 units/ml (Decreased for recent GIB) Monitor platelets by anticoagulation protocol: Yes   Plan:  Continue heparin to 950 units/hr Monitor daily HL, CBC/plt Monitor for signs/symptoms of bleeding  F/u DOAC plan   Cristela Felt, PharmD Clinical Pharmacist

## 2020-02-09 NOTE — Op Note (Addendum)
OPERATIVE NOTE   PROCEDURE: 1.  Exchange of right internal jugular vein temporary dialysis catheter for a tunneled dialysis catheter (19 cm palindrome) 2.  Right first stage basilic vein transposition (brachiobasilic arteriovenous fistula) placement  PRE-OPERATIVE DIAGNOSIS: AKI on CKD  POST-OPERATIVE DIAGNOSIS: same  SURGEON: Marty Heck, MD  ASSISTANT(S): Roxy Horseman, PA  ANESTHESIA: MAC  ESTIMATED BLOOD LOSS: <50 mL  FINDING(S): 1.  Basilic vein: 2.5 mm, acceptable 2.  Brachial artery: high bifurcation, larger branch 3 mm, disease free 3.  Venous outflow: palpable thrill  4.  Radial flow: palpable radial pulse  SPECIMEN(S):  none  INDICATIONS:   Paul Price is a 80 y.o. male who presents with AKI on CKD and vascular surgery was consulted for exchange of temporary dialysis catheter for a tunneled dialysis catheter and placement of permanent hemodialysis access.  Risks and benefits were discussed.  An assistant was needed for exposure and to expedite case.   DESCRIPTION: After full informed written consent was obtained from the patient, the patient was brought back to the operating room and placed supine upon the operating table.  Prior to induction, the patient received IV antibiotics.   After obtaining adequate anesthesia, the patient was then prepped and draped in the standard fashion for a right arm access procedure and right neck procedure.    I first turned my attention to the dialysis catheter.  This had already been prepped and draped.  Ultimately was able to place a wire down the catheter under fluoroscopy into the right atrium.  I then marked a 19 cm palindrome on the chest and made an counterincision on the chest wall.  I then tunneled this from the chest wall counterincision to the IJ stick site since this was a one piece catheter.  At that point in time I then removed the existing catheter over the wire and then used sequential dilators and  placed a large dilator peel-away sheath in the right atrium.  Over this I was able to advance the tunneled catheter with the tip in the right atrium and the peel-away sheath was removed.  The catheter was secured in multiple places with 3-0 silk ties and the incision in the neck was closed with 4-0 Monocryl Dermabond was applied.  Catheter flushed and aspirated without any complications.  There did not appear to be any kinks under fluoroscopy.  I then turned my attention first to identifying the patient's basilic vein and brachial artery in the right arm.  He appeared to have a high brachial bifurcation and I marked the larger artery.  Using SonoSite guidance, the location of these vessels were marked out on the skin.   At this point, I injected local anesthetic to obtain a field block of the antecubitum.  In total, I injected about 10 mL of 1% lidocaine without epinephrine.  I made a transverse incision at the level of the antecubitum and dissected through the subcutaneous tissue and fascia to gain exposure of the brachial artery.  This was noted to be 3 mm in diameter externally.  This was dissected out proximally and distally and controlled with vessel loops .  I then dissected out the basilic vein.  This was noted to be 2.5 mm in diameter externally.  The distal segment of the vein was ligated with a  2-0 silk, and the vein was transected.  The proximal segment was interrogated with serial dilators.  The vein accepted up to a 3.5 mm dilator without any  difficulty.  I then instilled the heparinized saline into the vein and clamped it.  At this point, I reset my exposure of the brachial artery.  The patient was given 3000 units IV heparin and I placed the artery under tension proximally and distally.  I made an arteriotomy with a #11 blade, and then I extended the arteriotomy with a Potts scissor.  The vein was then sewn to the artery in an end-to-side configuration with a running stitch of 6-0 Prolene.  Prior  to completing this anastomosis, I allowed the vein and artery to backbleed.  There was no evidence of clot from any vessels.  I completed the anastomosis in the usual fashion and then released all vessel loops and clamps.    There was a palpable thrill in the venous outflow, and there was a palpable radial pulse.  At this point, I irrigated out the surgical wound.  There was no further active bleeding.  The subcutaneous tissue was reapproximated with a running stitch of 3-0 Vicryl.  The skin was then reapproximated with a running subcuticular stitch of 4-0 Monocryl.  The skin was then cleaned, dried, and reinforced with Dermabond.  The patient tolerated this procedure well.   COMPLICATIONS: None  CONDITION: Stable  Marty Heck MD [facility] Office: [place]   02/09/2020, 3:19 PM

## 2020-02-09 NOTE — Progress Notes (Addendum)
[facility] Progress Note   Subjective:   Still feeling better- agreeable to continue with this dialysis - lives close to [place]    Objective Vitals:   02/08/20 1436 02/08/20 1955 02/08/20 2242 02/09/20 0433  BP: 126/70 138/60  (!) 145/77  Pulse: 74 62 67 70  Resp: 18 16 16 17   Temp: 98 F (36.7 C) 98.3 F (36.8 C)  97.7 F (36.5 C)  TempSrc: Oral Oral  Oral  SpO2: 95% 97% 97% 97%  Weight:      Height:       Physical Exam Gen: elderly man flat in bed - alert Eyes: anicteric, EOMI ENT: MM tacky Neck: supple, no JVD CV:  RRR, paced on monitor, no rub Back: clear lungs with normal WOB GU:  no foley Extr:  No edema Neuro:  hard of hearing-  Right non tunneled HD cath  Additional Objective Labs: Basic Metabolic Panel: Recent Labs  Lab 02/04/20 0213 02/05/20 0239 02/06/20 0146 02/07/20 0322 02/08/20 0151 02/09/20 0420  NA 146* 145 143 145 141 143  K 3.8 3.4* 4.0 3.6 3.7 3.9  CL 113* 114* 113* 115* 107 108  CO2 20* 22 18* 21* 25 24  GLUCOSE 116* 102* 109* 97 94 107*  BUN 55* 52* 49* 45* 26* 28*  CREATININE 4.58* 4.44* 4.25* 4.47* 3.53* 3.82*  CALCIUM 7.8* 7.9* 7.7* 7.8* 7.7* 8.0*  PHOS 3.0 3.2 2.9  --   --   --    Liver Function Tests: Recent Labs  Lab 02/04/20 0213 02/05/20 0239 02/06/20 0146  ALBUMIN 1.8* 1.8* 1.7*   No results for input(s): LIPASE, AMYLASE in the last 168 hours. CBC: Recent Labs  Lab 02/05/20 0834 02/06/20 0805 02/07/20 0322 02/08/20 0151 02/09/20 0420  WBC 10.7* 11.8* 10.5 10.3 11.1*  HGB 8.0* 7.8* 7.0* 7.4* 7.2*  HCT 27.2* 26.3* 24.7* 24.1* 23.7*  MCV 84.7 84.3 85.5 82.3 84.6  PLT 169 200 161 158 163   Blood Culture    Component Value Date/Time   SDES BLOOD RIGHT ANTECUBITAL 02/04/2020 1646   SPECREQUEST  02/04/2020 1646    BOTTLES DRAWN AEROBIC ONLY Blood Culture adequate volume   CULT  02/04/2020 1646    NO GROWTH 4 DAYS Performed at [facility], [place] [address], [place], [place] [address]     REPTSTATUS PENDING 02/04/2020 1646    Cardiac Enzymes: No results for input(s): CKTOTAL, CKMB, CKMBINDEX, TROPONINI in the last 168 hours. CBG: Recent Labs  Lab 02/08/20 0738 02/08/20 1122 02/08/20 1715 02/08/20 2220 02/09/20 0831  GLUCAP 81 123* 94 94 93   Iron Studies: No results for input(s): IRON, TIBC, TRANSFERRIN, FERRITIN in the last 72 hours. @lablastinr3 @ Studies/Results: No results found. Medications: . sodium chloride    . sodium chloride    . sodium chloride 10 mL/hr at 02/06/20 1126  .  ceFAZolin (ANCEF) IV    . heparin 950 Units/hr (02/09/20 6967)   . sodium chloride   Intravenous Once  . amLODipine  2.5 mg Oral Daily  . atorvastatin  20 mg Oral Daily  . Chlorhexidine Gluconate Cloth  6 each Topical Q0600  . insulin aspart  0-6 Units Subcutaneous TID WC  . metoprolol succinate  25 mg Oral Daily  . pantoprazole  40 mg Oral BID  . sodium bicarbonate  650 mg Oral TID  . sodium chloride flush  10-40 mL Intracatheter Q12H  . tamsulosin  0.4 mg Oral Daily   Assessment/Plan **AKI on CKD 5:  Now is ESRD- Initially thought to be prerenal but hasn't improved with volume resuscitation.  eGFR 12 is likely overestimation with advanced age and low muscle mass; We have initiated HD for uremia manifest as nausea and after HD #1 1/28 he has appreciable clinical improvement and can eat.  Plan next HD for 2/1 since getting AVF today; appreciated.  Stop sodium bicarb   **Staph UTI/ concern for bacteremia:  ID following.  On cefazolin.  TEE neg.  Blood cultures NGTD at 4 days.  Plans to stop abx tomorrow   **Nausea/emesis/anorexia:  Abd X ray unrevealing.  Persistent.  Trial of dialysis as this may be a uremic symptom has proven that nausea did improve after 1 HD.  Hopefully will continue to improve.  **Anemia:  On po iron, trend H/H; no indication for transfusion currently but into 7s now.  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 Got one dose feraheme on 1/28-  Will give  course of ferrlecit as well   **recent COVID:  S/p steroids and remdesivir last admission; CXR improved, ok on RA.   Off precautions per [facility] policy.   **A fib:  Paced currently, xarelto on hold for now -- would consider coumadin or eliquis if resumed this admission given eGFR < 15. Now with cardiac thrombus cardiology determining anticoag - heparin gtt now, likely eliquis after.  **Renal mass: noted on [place] last admission, plans for outpt urology f/u.  Dr. Johnney Ou discussed with pt and wife 1/27 - they were unaware of this.   Bones-  Check PTH- phos OK , no binder     Louis Meckel  02/09/2020, 11:10 AM   [facility] Pager: [phone]

## 2020-02-09 NOTE — Anesthesia Postprocedure Evaluation (Signed)
Anesthesia Post Note  Patient: Paul Price  Procedure(s) Performed: ARTERIOVENOUS (AV) FISTULA  CREATION RIGHT UPPER EXTREMITY (Right Arm Upper) EXCHANGE OF A DIALYSIS CATHETER (Right Chest)     Patient location during evaluation: PACU Anesthesia Type: MAC Level of consciousness: awake and alert Pain management: pain level controlled Vital Signs Assessment: post-procedure vital signs reviewed and stable Respiratory status: spontaneous breathing, nonlabored ventilation, respiratory function stable and patient connected to nasal cannula oxygen Cardiovascular status: stable and blood pressure returned to baseline Postop Assessment: no apparent nausea or vomiting Anesthetic complications: no   No complications documented.  Last Vitals:  Vitals:   02/09/20 1645 02/09/20 1703  BP: (!) 148/68 (!) 148/68  Pulse: 73 60  Resp: 20 17  Temp: (!) 36.1 C 36.8 C  SpO2: 98% 97%    Last Pain:  Vitals:   02/09/20 1703  TempSrc: Oral  PainSc:                  March Rummage Conleigh Heinlein

## 2020-02-10 ENCOUNTER — Encounter (HOSPITAL_COMMUNITY): Payer: Self-pay | Admitting: Vascular Surgery

## 2020-02-10 ENCOUNTER — Encounter (HOSPITAL_COMMUNITY): Payer: Medicare Other

## 2020-02-10 DIAGNOSIS — N179 Acute kidney failure, unspecified: Secondary | ICD-10-CM | POA: Diagnosis not present

## 2020-02-10 DIAGNOSIS — I480 Paroxysmal atrial fibrillation: Secondary | ICD-10-CM

## 2020-02-10 DIAGNOSIS — R778 Other specified abnormalities of plasma proteins: Secondary | ICD-10-CM

## 2020-02-10 LAB — CBC
HCT: 28.1 % — ABNORMAL LOW (ref 39.0–52.0)
HCT: 28.7 % — ABNORMAL LOW (ref 39.0–52.0)
Hemoglobin: 8.1 g/dL — ABNORMAL LOW (ref 13.0–17.0)
Hemoglobin: 8.4 g/dL — ABNORMAL LOW (ref 13.0–17.0)
MCH: 25.3 pg — ABNORMAL LOW (ref 26.0–34.0)
MCH: 25.7 pg — ABNORMAL LOW (ref 26.0–34.0)
MCHC: 28.8 g/dL — ABNORMAL LOW (ref 30.0–36.0)
MCHC: 29.3 g/dL — ABNORMAL LOW (ref 30.0–36.0)
MCV: 87.8 fL (ref 80.0–100.0)
MCV: 87.8 fL (ref 80.0–100.0)
Platelets: 148 10*3/uL — ABNORMAL LOW (ref 150–400)
Platelets: 148 10*3/uL — ABNORMAL LOW (ref 150–400)
RBC: 3.2 MIL/uL — ABNORMAL LOW (ref 4.22–5.81)
RBC: 3.27 MIL/uL — ABNORMAL LOW (ref 4.22–5.81)
RDW: 20.8 % — ABNORMAL HIGH (ref 11.5–15.5)
RDW: 21.3 % — ABNORMAL HIGH (ref 11.5–15.5)
WBC: 9.9 10*3/uL (ref 4.0–10.5)
WBC: 9.5 10*3/uL (ref 4.0–10.5)
nRBC: 0.4 % — ABNORMAL HIGH (ref 0.0–0.2)
nRBC: 0.4 % — ABNORMAL HIGH (ref 0.0–0.2)

## 2020-02-10 LAB — RENAL FUNCTION PANEL
Albumin: 1.7 g/dL — ABNORMAL LOW (ref 3.5–5.0)
Anion gap: 10 (ref 5–15)
BUN: 19 mg/dL (ref 8–23)
CO2: 25 mmol/L (ref 22–32)
Calcium: 7.7 mg/dL — ABNORMAL LOW (ref 8.9–10.3)
Chloride: 106 mmol/L (ref 98–111)
Creatinine, Ser: 2.63 mg/dL — ABNORMAL HIGH (ref 0.61–1.24)
GFR, Estimated: 24 mL/min — ABNORMAL LOW (ref >60)
Glucose, Bld: 88 mg/dL (ref 70–99)
Phosphorus: 2.6 mg/dL (ref 2.5–4.6)
Potassium: 4.1 mmol/L (ref 3.5–5.1)
Sodium: 141 mmol/L (ref 135–145)

## 2020-02-10 LAB — GLUCOSE, CAPILLARY
Glucose-Capillary: 86 mg/dL (ref 70–99)
Glucose-Capillary: 112 mg/dL — ABNORMAL HIGH (ref 70–99)
Glucose-Capillary: 107 mg/dL — ABNORMAL HIGH (ref 70–99)

## 2020-02-10 LAB — TYPE AND SCREEN
ABO/RH(D): O POS
Antibody Screen: NEGATIVE
Unit division: 0

## 2020-02-10 LAB — BPAM RBC
Blood Product Expiration Date: 202203022359
ISSUE DATE / TIME: 202201311439
Unit Type and Rh: 5100

## 2020-02-10 MED ORDER — ENSURE ENLIVE PO LIQD
237.0000 mL | Freq: Three times a day (TID) | ORAL | Status: DC
  Administered 2020-02-10 – 2020-02-12 (×5): 237 mL via ORAL

## 2020-02-10 MED ORDER — ENSURE PRE-SURGERY PO LIQD: 296.0000 mL | Freq: Once | ORAL | Status: DC

## 2020-02-10 MED ORDER — ENSURE ENLIVE PO LIQD
237.0000 mL | Freq: Two times a day (BID) | ORAL | Status: DC
  Administered 2020-02-10 (×2): 237 mL via ORAL

## 2020-02-10 MED ORDER — RENA-VITE PO TABS
1.0000 | ORAL_TABLET | Freq: Every day | ORAL | Status: DC
  Administered 2020-02-10 – 2020-02-11 (×2): 1 via ORAL
  Filled 2020-02-10 (×2): qty 1

## 2020-02-10 NOTE — Progress Notes (Signed)
Pt has refused cpap at this time stating "those machines aren't worth a hoot.'  RT explained if he changed his mind to contact RN and she would contact RT.  RT will continue to monitor.  Device still in room at this time.  Will remove with 3 refusals.

## 2020-02-10 NOTE — Progress Notes (Signed)
PT Cancellation Note  Patient Details Name: LEE KUANG MRN: [id number] DOB: Apr 16, 1940   Cancelled Treatment:    Reason Eval/Treat Not Completed: Patient at procedure or test/unavailable (HD). Will follow-up for PT treatment as schedule permits.  Mabeline Caras, PT, DPT Acute Rehabilitation Services  Pager [phone] Office [place] 02/10/2020, 7:24 AM

## 2020-02-10 NOTE — Progress Notes (Signed)
Progress Note  [name] Date of Encounter: 02/10/2020  [facility] Cardiologist: Shelva Majestic, MD   Subjective   Tired, denies chest pain or SOB.  Inpatient Medications    Scheduled Meds: . sodium chloride   Intravenous Once  . amLODipine  2.5 mg Oral Daily  . atorvastatin  20 mg Oral Daily  . Chlorhexidine Gluconate Cloth  6 each Topical Q0600  . Chlorhexidine Gluconate Cloth  6 each Topical Q0600  . insulin aspart  0-6 Units Subcutaneous TID WC  . metoprolol succinate  25 mg Oral Daily  . mupirocin ointment  1 application Nasal BID  . pantoprazole  40 mg Oral BID  . sodium chloride flush  10-40 mL Intracatheter Q12H  . tamsulosin  0.4 mg Oral Daily   Continuous Infusions: . sodium chloride    . sodium chloride    . sodium chloride 10 mL/hr at 02/09/20 1741  . ferric gluconate (FERRLECIT/NULECIT) IV 125 mg (02/09/20 1741)   PRN Meds: sodium chloride, sodium chloride, acetaminophen **OR** acetaminophen, alteplase, heparin, lidocaine (PF), lidocaine-prilocaine, ondansetron **OR** ondansetron (ZOFRAN) IV, oxyCODONE, pentafluoroprop-tetrafluoroeth, polyethylene glycol, promethazine, sodium chloride flush   Vital Signs    Vitals:   02/10/20 0720 02/10/20 0730 02/10/20 0800 02/10/20 0830  BP: (!) 155/50 (!) 142/52 (!) 141/56 (!) 133/57  Pulse:      Resp: 17 18 15 15   Temp:      TempSrc:      SpO2:      Weight:      Height:        Intake/Output Summary (Last 24 hours) at 02/10/2020 0903 Last data filed at 02/10/2020 0357 Gross per 24 hour  Intake 815 ml  Output 470 ml  Net 345 ml   Last 3 Weights 02/10/2020 02/02/2020 01/25/2020  Weight (lbs) 178 lb 2.1 oz 176 lb 12.9 oz 184 lb 1.4 oz  Weight (kg) 80.8 kg 80.2 kg 83.5 kg      Telemetry    V paced rhythm, PVCs - Personally Reviewed  ECG    No new tracing - Personally Reviewed  Physical Exam  Sleeping at the HD GEN: No acute distress.   Neck: No JVD Cardiac: RRR, no murmurs, rubs, or  gallops. Perm cath in the right upper chest Respiratory: Clear to auscultation bilaterally. GI: Soft, nontender, non-distended  MS: No edema; No deformity. Icterus of the skin Neuro:  Nonfocal  Psych: Normal affect   Labs    High Sensitivity Troponin:   Recent Labs  Lab 02/01/20 1618 02/01/20 1844  TROPONINIHS 90* 82*     Chemistry Recent Labs  Lab 02/04/20 0213 02/05/20 0239 02/06/20 0146 02/07/20 0322 02/08/20 0151 02/09/20 0420  NA 146* 145 143 145 141 143  K 3.8 3.4* 4.0 3.6 3.7 3.9  CL 113* 114* 113* 115* 107 108  CO2 20* 22 18* 21* 25 24  GLUCOSE 116* 102* 109* 97 94 107*  BUN 55* 52* 49* 45* 26* 28*  CREATININE 4.58* 4.44* 4.25* 4.47* 3.53* 3.82*  CALCIUM 7.8* 7.9* 7.7* 7.8* 7.7* 8.0*  ALBUMIN 1.8* 1.8* 1.7*  --   --   --   GFRNONAA 12* 13* 13* 13* 17* 15*  ANIONGAP 13 9 12 9 9 11      Hematology Recent Labs  Lab 02/07/20 0322 02/08/20 0151 02/09/20 0420  WBC 10.5 10.3 11.1*  RBC 2.89* 2.93* 2.80*  HGB 7.0* 7.4* 7.2*  HCT 24.7* 24.1* 23.7*  MCV 85.5 82.3 84.6  MCH 24.2*  25.3* 25.7*  MCHC 28.3* 30.7 30.4  RDW 19.8* 19.9* 20.1*  PLT 161 158 163   BNPNo results for input(s): BNP, PROBNP in the last 168 hours.   DDimer No results for input(s): DDIMER in the last 168 hours.   Radiology    DG Chest Port 1 View  Result Date: 02/09/2020 CLINICAL DATA:  Right-sided dialysis catheter EXAM: PORTABLE CHEST 1 VIEW COMPARISON:  02/01/2020 FINDINGS: Cardiomegaly with mild pulmonary vascular congestion. Mild patchy opacity in the lingula and left lower lobe, likely atelectasis versus mild perihilar edema. Suspected small bilateral pleural effusions. No pneumothorax. 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 Right chest dual lumen dialysis catheter terminating in the upper right atrium. Median sternotomy. IMPRESSION: Cardiomegaly with mild pulmonary vascular congestion and small bilateral pleural effusions. Mild patchy  opacity in the lingula and left lower lobe, likely atelectasis versus mild perihilar edema. Right chest dual lumen dialysis catheter terminating in the upper right atrium. Electronically Signed   By: Julian Hy M.D.   On: 02/09/2020 16:15   HYBRID OR IMAGING ([facility] ONLY)  Result Date: 02/09/2020 There is no interpretation for this exam.  This order is for images obtained during a surgical procedure.  Please See "Surgeries" Tab for more information regarding the procedure.   Cardiac Studies   TTE: 1/28.2022 1. Global hypokinesis worse in the septum. Left ventricular ejection  fraction, by estimation, is 40 to 45%. The left ventricle has mildly  decreased function. The left ventricle demonstrates global hypokinesis.  Left ventricular diastolic function could  not be evaluated.  2. Right ventricular systolic function is mildly reduced. The right  ventricular size is normal.  3. Thrombus in the left atrial appendage measuring 1.5 x 0.6 cm. Left  atrial size was mildly dilated. A left atrial/left atrial appendage  thrombus was detected. The LAA emptying velocity was 47 cm/s.  4. Pacemaker wire.  5. The mitral valve is normal in structure. Mild mitral valve  regurgitation. No evidence of mitral stenosis.  6. Tricuspid valve regurgitation is mild to moderate.  7. The aortic valve is tricuspid. Aortic valve regurgitation is not  visualized. No aortic stenosis is present.  8. There is mild (Grade II) atheroma plaque involving the descending  aorta.   Patient Profile     80 y.o. male with history of CAD s/p CABG, PAF, second degree AV block s/p PPM, chronic systolic CHF, CKD stage 4, OSA, HLD, HTN, GI bleeding and anemia on chronic anticoagulation. He has been on Xarelto for PAF. He was admitted 01/20/20-01/26/20 with GI bleeding and Covid 19 infection treated with Remdesivir and steroids. He is fully vaccinated but has not received a booster. He was hydrated and transfused for anemia.  His GI bleeding was felt to be due to gastritis. Prior capsule endoscopy without source. His Xarelto was not restarted. He was readmitted 02/01/20 with poor po intake, nausea and found to have worsened renal function. He was treated for a UTI during that admission. TEE today with LA thrombus but no endocarditis.Plans for HD to start today.   Assessment & Plan    Given PAF and now LA thrombus, he is at high risk for embolic event, he remains anemic while on HD, he should be challenged with IV heparin and follow H/H closely. If he does not have further GI bleeding, will need to be discharged on a DOAC. Eliquis may be a better option for him. No signs of CHF. We will sign off and  arrange for an outpatient follow up.   For questions or updates, please contact [place] Please consult [email or web address] for contact info under     Signed, Ena Dawley, MD  02/10/2020, 9:03 AM

## 2020-02-10 NOTE — Progress Notes (Signed)
[facility] Progress Note   Subjective:   Seen in HD-  Doing well-  S/p AVF and TDC placement yest-  Bruising and pain but has bruit  Objective Vitals:   02/10/20 0720 02/10/20 0730 02/10/20 0800 02/10/20 0830  BP: (!) 155/50 (!) 142/52 (!) 141/56 (!) 133/57  Pulse:      Resp: 17 18 15 15   Temp:      TempSrc:      SpO2:      Weight:      Height:       Physical Exam Gen: elderly man flat in bed - alert Eyes: anicteric, EOMI ENT: MM tacky Neck: supple, no JVD CV:  RRR, paced on monitor, no rub Back: clear lungs with normal WOB GU:  no foley Extr:  No edema   Additional Objective Labs: Basic Metabolic Panel: Recent Labs  Lab 02/04/20 0213 02/05/20 0239 02/06/20 0146 02/07/20 0322 02/08/20 0151 02/09/20 0420  NA 146* 145 143 145 141 143  K 3.8 3.4* 4.0 3.6 3.7 3.9  CL 113* 114* 113* 115* 107 108  CO2 20* 22 18* 21* 25 24  GLUCOSE 116* 102* 109* 97 94 107*  BUN 55* 52* 49* 45* 26* 28*  CREATININE 4.58* 4.44* 4.25* 4.47* 3.53* 3.82*  CALCIUM 7.8* 7.9* 7.7* 7.8* 7.7* 8.0*  PHOS 3.0 3.2 2.9  --   --   --    Liver Function Tests: Recent Labs  Lab 02/04/20 0213 02/05/20 0239 02/06/20 0146  ALBUMIN 1.8* 1.8* 1.7*   No results for input(s): LIPASE, AMYLASE in the last 168 hours. CBC: Recent Labs  Lab 02/06/20 0805 02/07/20 0322 02/08/20 0151 02/09/20 0420 02/10/20 0850  WBC 11.8* 10.5 10.3 11.1* 9.9  HGB 7.8* 7.0* 7.4* 7.2* 8.1*  HCT 26.3* 24.7* 24.1* 23.7* 28.1*  MCV 84.3 85.5 82.3 84.6 87.8  PLT 200 161 158 163 148*   Blood Culture    Component Value Date/Time   SDES BLOOD RIGHT ANTECUBITAL 02/04/2020 1646   SPECREQUEST  02/04/2020 1646    BOTTLES DRAWN AEROBIC ONLY Blood Culture adequate volume   CULT  02/04/2020 1646    NO GROWTH 5 DAYS Performed at [facility], [place] [address] [place], [place] [address]    REPTSTATUS 02/09/2020 FINAL 02/04/2020 1646    Cardiac Enzymes: No results for input(s): CKTOTAL, CKMB,  CKMBINDEX, TROPONINI in the last 168 hours. CBG: Recent Labs  Lab 02/09/20 0831 02/09/20 1117 02/09/20 1745 02/09/20 2105 02/10/20 0641  GLUCAP 93 95 113* 91 86   Iron Studies: No results for input(s): IRON, TIBC, TRANSFERRIN, FERRITIN in the last 72 hours. @lablastinr3 @ Studies/Results: DG Chest Port 1 View  Result Date: 02/09/2020 CLINICAL DATA:  Right-sided dialysis catheter EXAM: PORTABLE CHEST 1 VIEW COMPARISON:  02/01/2020 FINDINGS: Cardiomegaly with mild pulmonary vascular congestion. Mild patchy opacity in the lingula and left lower lobe, likely atelectasis versus mild perihilar edema. Suspected small bilateral pleural effusions. No pneumothorax. 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 Right chest dual lumen dialysis catheter terminating in the upper right atrium. Median sternotomy. IMPRESSION: Cardiomegaly with mild pulmonary vascular congestion and small bilateral pleural effusions. Mild patchy opacity in the lingula and left lower lobe, likely atelectasis versus mild perihilar edema. Right chest dual lumen dialysis catheter terminating in the upper right atrium. Electronically Signed   By: Julian Hy M.D.   On: 02/09/2020 16:15   HYBRID OR IMAGING ([facility] ONLY)  Result Date: 02/09/2020 There is no interpretation  for this exam.  This order is for images obtained during a surgical procedure.  Please See "Surgeries" Tab for more information regarding the procedure.   Medications: . sodium chloride    . sodium chloride    . sodium chloride 10 mL/hr at 02/09/20 1741  . ferric gluconate (FERRLECIT/NULECIT) IV 125 mg (02/09/20 1741)   . sodium chloride   Intravenous Once  . amLODipine  2.5 mg Oral Daily  . atorvastatin  20 mg Oral Daily  . Chlorhexidine Gluconate Cloth  6 each Topical Q0600  . Chlorhexidine Gluconate Cloth  6 each Topical Q0600  . insulin aspart  0-6 Units Subcutaneous TID WC  . metoprolol succinate  25 mg Oral  Daily  . mupirocin ointment  1 application Nasal BID  . pantoprazole  40 mg Oral BID  . sodium chloride flush  10-40 mL Intracatheter Q12H  . 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  eGFR 12 is likely overestimation with advanced age and low muscle mass; We have initiated HD for uremia manifest as nausea and after HD #1 1/28 he has appreciable clinical improvement and can eat.  Plan HD for today, then next on 2/3 -  CLIP in process   **Staph UTI/ concern for bacteremia:   On cefazolin.  TEE neg.  Blood cultures NGTD at 4 days.  Plans to stop abx   **Nausea/emesis/anorexia:  Abd X ray unrevealing.  Persistent.  Trial of dialysis as this may be a uremic symptom has proven that nausea did improve after 1 HD.  Hopefully will continue to improve.  **Anemia:  On po iron, trend H/H; no indication for transfusion currently but into 7s now.  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 Got one dose feraheme on 1/28-  Will give course of ferrlecit as well now that ESRD  **recent COVID:  S/p steroids and remdesivir last admission; CXR improved, ok on RA.   Off precautions per [facility] policy.   **A fib:  Paced currently, xarelto on hold for now -- would consider coumadin or eliquis if resumed this admission given eGFR < 15. Now with cardiac thrombus cardiology determining anticoag - heparin gtt now, likely eliquis after.  **Renal mass: noted on [place] last admission, plans for outpt urology f/u.  Dr. Johnney Ou discussed with pt and wife 1/27 - they were unaware of this.   Bones-  Check PTH, not drawn- phos OK , no binder     Louis Meckel  02/10/2020, 9:29 AM  [facility] Pager: [phone]

## 2020-02-10 NOTE — Discharge Instructions (Addendum)
[facility]  Discharge Instructions  AV Fistula or Graft Surgery for Dialysis Access  Please refer to the following instructions for your post-procedure care. Your surgeon or physician assistant will discuss any changes with you.  Activity  You may drive the day following your surgery, if you are comfortable and no longer taking prescription pain medication. Resume full activity as the soreness in your incision resolves.  Bathing/Showering  You may shower after you go home. Keep your incision dry for 48 hours. Do not soak in a bathtub, hot tub, or swim until the incision heals completely. You may not shower if you have a hemodialysis catheter.  Incision Care  Clean your incision with mild soap and water after 48 hours. Pat the area dry with a clean towel. You do not need a bandage unless otherwise instructed. Do not apply any ointments or creams to your incision. You may have skin glue on your incision. Do not peel it off. It will come off on its own in about one week. Your arm may swell a bit after surgery. To reduce swelling use pillows to elevate your arm so it is above your heart. Your doctor will tell you if you need to lightly wrap your arm with an ACE bandage.  Diet  Resume your normal diet. There are not special food restrictions following this procedure. In order to heal from your surgery, it is CRITICAL to get adequate nutrition. Your body requires vitamins, minerals, and protein. Vegetables are the best source of vitamins and minerals. Vegetables also provide the perfect balance of protein. Processed food has little nutritional value, so try to avoid this.  Medications  Resume taking all of your medications. If your incision is causing pain, you may take over-the counter pain relievers such as acetaminophen (Tylenol). If you were prescribed a stronger pain medication, please be aware these medications can cause nausea and constipation.  Prevent nausea by taking the medication with a snack or meal. Avoid constipation by drinking plenty of fluids and eating foods with high amount of fiber, such as fruits, vegetables, and grains. Do not take Tylenol if you are taking prescription pain medications.     Follow up Your surgeon may want to see you in the office following your access surgery. If so, this will be arranged at the time of your surgery.  Please call us immediately for any of the following conditions:  Increased pain, redness, drainage (pus) from your incision site Fever of 101 degrees or higher Severe or worsening pain at your incision site Hand pain or numbness.  Reduce your risk of vascular disease:  Stop smoking. If you would like help, call [facility] at 1-800-QUIT-NOW (620)711-2644) or [place] at [place] your cholesterol Maintain a desired weight Control your diabetes Keep your blood pressure down  Dialysis  It will take several weeks to several months for your new dialysis access to be ready for use. Your surgeon will determine when it is OK to use it. Your nephrologist will continue to direct your dialysis. You can continue to use your Permcath until your new access is ready for use.  If you have any questions, please call the office at [phone].      Information on my medicine - ELIQUIS (apixaban)  This medication education was reviewed with me or my healthcare representative as part of my discharge preparation.    Why was Eliquis prescribed for you? Eliquis was prescribed to treat blood clots  that may have been found in the veins of your legs (deep vein thrombosis) or in your lungs (pulmonary embolism) and to reduce the risk of them occurring again.  What do You need to know about Eliquis ? The starting dose is 10 mg (two 5 mg tablets) taken TWICE daily for the FIRST SEVEN (7) DAYS, then on  02/14/20  the dose is reduced to ONE 5 mg tablet taken TWICE daily.  Eliquis  may be taken with or without food.   Try to take the dose about the same time in the morning and in the evening. If you have difficulty swallowing the tablet whole please discuss with your pharmacist how to take the medication safely.  Take Eliquis exactly as prescribed and DO NOT stop taking Eliquis without talking to the doctor who prescribed the medication.  Stopping may increase your risk of developing a new blood clot.  Refill your prescription before you run out.  After discharge, you should have regular check-up appointments with your healthcare provider that is prescribing your Eliquis.    What do you do if you miss a dose? If a dose of ELIQUIS is not taken at the scheduled time, take it as soon as possible on the same day and twice-daily administration should be resumed. The dose should not be doubled to make up for a missed dose.  Important Safety Information A possible side effect of Eliquis is bleeding. You should call your healthcare provider right away if you experience any of the following: ? Bleeding from an injury or your nose that does not stop. ? Unusual colored urine (red or dark brown) or unusual colored stools (red or black). ? Unusual bruising for unknown reasons. ? A serious fall or if you hit your head (even if there is no bleeding).  Some medicines may interact with Eliquis and might increase your risk of bleeding or clotting while on Eliquis. To help avoid this, consult your healthcare provider or pharmacist prior to using any new prescription or non-prescription medications, including herbals, vitamins, non-steroidal anti-inflammatory drugs (NSAIDs) and supplements.  This website has more information on Eliquis (apixaban): http://www.eliquis.com/eliquis/home ===========================================================

## 2020-02-10 NOTE — Progress Notes (Signed)
PROGRESS NOTE    Paul Price  AUQ:[id number] DOB: 08/08/1940 DOA: 02/01/2020 PCP: [name], MD   Brief Narrative: 80 year old with past medical history significant for paroxysmal A. fib, chronic kidney disease, hypertension, hyperlipidemia, BPH who presented to the ED complaining of shortness of breath and nausea.  Patient was recently discharged on 01/26/2020 after admission for upper GI bleed, AKI and Covid pneumonia.  Patient finished a course of Remdesivir  and steroids.  He did not have oxygen requirement at that time.  Patient was evaluated by GI at that time and advised to hold Xarelto until 01/31/2020.  Patient did not resume Xarelto at home.  Patient was discharged with a creatinine of 3.7.  He presents with nausea vomiting, unable to keep anything down.  Patient admitted with poor oral intake, dehydration, hyponatremia, nausea and AKI. He was found to have Staph aureus in Urine. ID was consulted. TEE was done to rule out endocarditis. TEE negative for endocarditis but showed left Atrial appendage thrombus.  Blood culture negative. He was treated with 6 days of Cefazolin.   He was started on HD, to see if that would help with nausea and poor oral intake. He has been feeling better after he was started on HD. Underwent first stage of AVF on 1/31.  He was started on heparin for A fib and left appendage Thrombus. Heparin currently on hold due to hematoma at AV fistula site,.        Assessment & Plan:   Principal Problem:   AKI (acute kidney injury) ([place]) Active Problems:   HTN (hypertension)   Hyperlipidemia with target LDL less than 70   OSA on CPAP   PAF (paroxysmal atrial fibrillation) (HCC)   COVID-19 virus infection   Intractable nausea and vomiting   Hypernatremia   Elevated troponin  1-AKI on CKD Stage IV: -Recent creatinine at discharge was 3.7.  Presented with a creatinine of 4.5. -Appreciate nephrology evaluation.  -Patient with some uremic symptoms: Poor oral  intake, nausea. HD trial.  -Patient started on HD 1/28. Second HD 02/10/2020. -Underwent first stage  Basilic Vein  fistula placement and conversion  to HD tunneled catheter on 1-31 by Dr Carlis Abbott.    2-Staph Aureus in urine.  UTI: urine culture grew  Staph aureus.  Completed cefazolin 6 days.  TEE negative for endocarditis.  Appreciate ID recommendations.  Blood culture obtain 1/26: No growth to date,.   3-Anemia; iron deficiency;  Received IV iron this admission.  Received one unit PRBC 02/09/2020 per op.  Hb 8.0  4-A. Fib;  Left Atrium Appendage Thrombus:  -Per GI from last admission ok to resume  Xarelto 1/22. -TEE showed, Left Atrium appendage thrombus.  -Evaluated by cardiologist who agrees with Heparin. If hb remain stable he could be transition to eliquis.  Heparin currently on hold post op due to hematoma at fistula site.   Hypoalbuminemia;  -started ensure.  -Nutritionist consulted.   HFrEF;  Echo 2021 ejection fraction 40 to 45% Monitor volume status Continue to hold IV Lasix  Nausea and vomiting; KUB negative for bowel obstruction Resolved.   Elevated troponin: Most likely demand ischemia and worsening renal function. Denies chest pain.  Generalized weakness: PT eval.  Hypertension: Continue with Norvasc and Metoprolol.  Hyponatremia: Related to hypovolemia poor oral intake. Resolved With IV fluids.  Hypernatremia;  Resolved with fluids.   3-Hypokalemia: Resolved.   Covid positive, tested 01/20/2020 Completed a course of rhythm severe and IV steroid Per Dr Baxter Flattery, ok to  discontinue isolation.   Constipation; received sorbitol. He had 3 large BM last night.    Estimated body mass index is 28.75 kg/m (pended) as calculated from the following:   Height as of this encounter: 5\' 6"  (1.676 m).   Weight as of this encounter: (P) 80.8 kg.   DVT prophylaxis: Scd Code Status: Full code Family Communication: Care  discussed with patient and wife 1/31 who  was at bedside Disposition Plan:  Status is: Inpatient  Remains inpatient appropriate because:Persistent severe electrolyte disturbances   Dispo: The patient is from: Home              Anticipated d/c is to: Home              Anticipated d/c date is: 3 days              Patient currently is not medically stable to d/c.   Difficult to place patient No        Consultants:   Nephrology,.   Procedures:  none Antimicrobials:  Ceftriaxone   Subjective: He just finished HD today. He report feeling better. He denies nausea. He had 3 BM last night.  He has mild edema right arm. He is aware that he needs to keep arm elevated.   Objective: Vitals:   02/10/20 0830 02/10/20 0900 02/10/20 0930 02/10/20 1000  BP: (!) 133/57 (!) 141/53 (!) (P) 139/54 (!) (P) 140/56  Pulse:      Resp: 15     Temp:      TempSrc:      SpO2:      Weight:      Height:        Intake/Output Summary (Last 24 hours) at 02/10/2020 1046 Last data filed at 02/10/2020 1000 Gross per 24 hour  Intake 815 ml  Output 970 ml  Net -155 ml   Filed Weights   02/02/20 1952 02/10/20 0716  Weight: 80.2 kg (P) 80.8 kg    Examination:  General exam: NAD Respiratory system: CTA Cardiovascular system: S 1, S 2 RRR Gastrointestinal system: BS present, soft, nt Central nervous system: Alert Extremities:  No edema    Data Reviewed: I have personally reviewed following labs and imaging studies  CBC: Recent Labs  Lab 02/06/20 0805 02/07/20 0322 02/08/20 0151 02/09/20 0420 02/10/20 0850  WBC 11.8* 10.5 10.3 11.1* 9.9  HGB 7.8* 7.0* 7.4* 7.2* 8.1*  HCT 26.3* 24.7* 24.1* 23.7* 28.1*  MCV 84.3 85.5 82.3 84.6 87.8  PLT 200 161 158 163 287*   Basic Metabolic Panel: Recent Labs  Lab 02/03/20 1450 02/04/20 0213 02/05/20 0239 02/06/20 0146 02/07/20 0322 02/08/20 0151 02/09/20 0420 02/10/20 0848  NA 147* 146* 145 143 145 141 143 141  K 3.3* 3.8 3.4* 4.0 3.6 3.7 3.9 4.1  CL 113* 113* 114* 113* 115*  107 108 106  CO2 22 20* 22 18* 21* 25 24 25   GLUCOSE 143* 116* 102* 109* 97 94 107* 88  BUN 58* 55* 52* 49* 45* 26* 28* 19  CREATININE 4.56* 4.58* 4.44* 4.25* 4.47* 3.53* 3.82* 2.63*  CALCIUM 7.8* 7.8* 7.9* 7.7* 7.8* 7.7* 8.0* 7.7*  PHOS 2.6 3.0 3.2 2.9  --   --   --  2.6   GFR: Estimated Creatinine Clearance: 21.9 mL/min (A) (by C-G formula based on SCr of 2.63 mg/dL (H)). Liver Function Tests: Recent Labs  Lab 02/03/20 1450 02/04/20 0213 02/05/20 0239 02/06/20 0146 02/10/20 0848  ALBUMIN 2.0* 1.8* 1.8* 1.7* 1.7*  No results for input(s): LIPASE, AMYLASE in the last 168 hours. No results for input(s): AMMONIA in the last 168 hours. Coagulation Profile: Recent Labs  Lab 02/09/20 0420  INR 1.6*   Cardiac Enzymes: No results for input(s): CKTOTAL, CKMB, CKMBINDEX, TROPONINI in the last 168 hours. BNP (last 3 results) No results for input(s): PROBNP in the last 8760 hours. HbA1C: No results for input(s): HGBA1C in the last 72 hours. CBG: Recent Labs  Lab 02/09/20 0831 02/09/20 1117 02/09/20 1745 02/09/20 2105 02/10/20 0641  GLUCAP 93 95 113* 91 86   Lipid Profile: No results for input(s): CHOL, HDL, LDLCALC, TRIG, CHOLHDL, LDLDIRECT in the last 72 hours. Thyroid Function Tests: No results for input(s): TSH, T4TOTAL, FREET4, T3FREE, THYROIDAB in the last 72 hours. Anemia Panel: No results for input(s): VITAMINB12, FOLATE, FERRITIN, TIBC, IRON, RETICCTPCT in the last 72 hours. Sepsis Labs: No results for input(s): PROCALCITON, LATICACIDVEN in the last 168 hours.  Recent Results (from the past 240 hour(s))  Urine culture     Status: Abnormal   Collection Time: 02/01/20  5:13 PM   Specimen: Urine, Random  Result Value Ref Range Status   Specimen Description URINE, RANDOM  Final   Special Requests   Final    NONE Performed at [facility], [address], [place], [place] [address]    Culture >=100,000 COLONIES/mL STAPHYLOCOCCUS AUREUS (A)  Final    Report Status 02/05/2020 FINAL  Final   Organism ID, Bacteria STAPHYLOCOCCUS AUREUS (A)  Final      Susceptibility   Staphylococcus aureus - MIC*    CIPROFLOXACIN <=0.5 SENSITIVE Sensitive     GENTAMICIN <=0.5 SENSITIVE Sensitive     NITROFURANTOIN <=16 SENSITIVE Sensitive     OXACILLIN <=0.25 SENSITIVE Sensitive     TETRACYCLINE <=1 SENSITIVE Sensitive     VANCOMYCIN <=0.5 SENSITIVE Sensitive     TRIMETH/SULFA <=10 SENSITIVE Sensitive     CLINDAMYCIN <=0.25 SENSITIVE Sensitive     RIFAMPIN <=0.5 SENSITIVE Sensitive     Inducible Clindamycin NEGATIVE Sensitive     * >=100,000 COLONIES/mL STAPHYLOCOCCUS AUREUS  Culture, blood (Routine X 2) w Reflex to ID Panel     Status: None   Collection Time: 02/04/20  4:40 PM   Specimen: BLOOD  Result Value Ref Range Status   Specimen Description BLOOD RIGHT ANTECUBITAL  Final   Special Requests   Final    BOTTLES DRAWN AEROBIC ONLY Blood Culture adequate volume   Culture   Final    NO GROWTH 5 DAYS Performed at [facility], [place] [address] [place], [place] [address]    Report Status 02/09/2020 FINAL  Final  Culture, blood (Routine X 2) w Reflex to ID Panel     Status: None   Collection Time: 02/04/20  4:46 PM   Specimen: BLOOD  Result Value Ref Range Status   Specimen Description BLOOD RIGHT ANTECUBITAL  Final   Special Requests   Final    BOTTLES DRAWN AEROBIC ONLY Blood Culture adequate volume   Culture   Final    NO GROWTH 5 DAYS Performed at [facility], [place] [address] [place], [place] [address]    Report Status 02/09/2020 FINAL  Final  Surgical PCR screen     Status: Abnormal   Collection Time: 02/09/20  7:23 AM   Specimen: Nasal Mucosa; Nasal Swab  Result Value Ref Range Status   MRSA, PCR NEGATIVE NEGATIVE Final   Staphylococcus aureus POSITIVE (A) NEGATIVE Final  Comment: (NOTE) The Xpert SA Assay (FDA approved for NASAL specimens in patients 17 years of age and older), is one component of a  comprehensive surveillance program. It is not intended to diagnose infection nor to guide or monitor treatment. Performed at [facility], [place] [address] [place], [place] [address]          Radiology Studies: DG Chest Port 1 View  Result Date: 02/09/2020 CLINICAL DATA:  Right-sided dialysis catheter EXAM: PORTABLE CHEST 1 VIEW COMPARISON:  02/01/2020 FINDINGS: Cardiomegaly with mild pulmonary vascular congestion. Mild patchy opacity in the lingula and left lower lobe, likely atelectasis versus mild perihilar edema. Suspected small bilateral pleural effusions. No pneumothorax. 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 Right chest dual lumen dialysis catheter terminating in the upper right atrium. Median sternotomy. IMPRESSION: Cardiomegaly with mild pulmonary vascular congestion and small bilateral pleural effusions. Mild patchy opacity in the lingula and left lower lobe, likely atelectasis versus mild perihilar edema. Right chest dual lumen dialysis catheter terminating in the upper right atrium. Electronically Signed   By: Julian Hy M.D.   On: 02/09/2020 16:15   HYBRID OR IMAGING ([facility] ONLY)  Result Date: 02/09/2020 There is no interpretation for this exam.  This order is for images obtained during a surgical procedure.  Please See "Surgeries" Tab for more information regarding the procedure.        Scheduled Meds: . sodium chloride   Intravenous Once  . amLODipine  2.5 mg Oral Daily  . atorvastatin  20 mg Oral Daily  . Chlorhexidine Gluconate Cloth  6 each Topical Q0600  . Chlorhexidine Gluconate Cloth  6 each Topical Q0600  . feeding supplement  237 mL Oral BID BM  . insulin aspart  0-6 Units Subcutaneous TID WC  . metoprolol succinate  25 mg Oral Daily  . mupirocin ointment  1 application Nasal BID  . pantoprazole  40 mg Oral BID  . sodium chloride flush  10-40 mL Intracatheter Q12H  . tamsulosin  0.4 mg Oral Daily    Continuous Infusions: . sodium chloride    . sodium chloride    . sodium chloride 10 mL/hr at 02/09/20 1741  . ferric gluconate (FERRLECIT/NULECIT) IV 125 mg (02/09/20 1741)     LOS: 8 days    Time spent: 35 minutes    Johnaton Sonneborn A Maniah Nading, MD [facility]   If 7PM-7AM, please contact night-coverage [email or web address]  02/10/2020, 10:46 AM

## 2020-02-10 NOTE — Progress Notes (Signed)
Initial Nutrition Assessment  DOCUMENTATION CODES:   Not applicable  INTERVENTION:   -Increase Ensure Enlive po to TID, each supplement provides 350 kcal and 20 grams of protein -Renal MVI daily -Provided "[place] for Healthy Eating with Kidney Disease"; attached to AVS/ discharge summary  NUTRITION DIAGNOSIS:   Inadequate oral intake related to decreased appetite as evidenced by meal completion < 50%.  GOAL:   Patient will meet greater than or equal to 90% of their needs  MONITOR:   PO intake,Supplement acceptance,Diet advancement,Labs,Weight trends,Skin,I & O's  REASON FOR ASSESSMENT:   Consult Assessment of nutrition requirement/status  ASSESSMENT:   80 year old with past medical history significant for paroxysmal A. fib, chronic kidney disease, hypertension, hyperlipidemia, BPH who presented to the ED complaining of shortness of breath and nausea.  Pt admitted with AKI on CKD stage IV.   1/28- s/p TEE- revealed no evidence of endocarditis  1/28- first HD  1/31- s/p 1.  Exchange of right internal jugular vein temporary dialysis catheter for a tunneled dialysis catheter (19 cm palindrome) 2.  Right first stage basilic vein transposition (brachiobasilic arteriovenous fistula) placement 2/1- second HD  Reviewed I/O's: +345 ml x 24 hours and +6.1 L since admission  UOP: 450 ml x 24 hours  Pt unavailable at time of visit.   Per meal completions records, pt with poor oral intake. Noted meal completion 0-40%. Pt has been ordered Ensure supplements and he is consuming per MAR.   Reviewed wt hx; wt has been stable over the past 6 months.   Labs reviewed: CBGS: 86-112 (inpatient orders for glycemic control are 0-6 units insulin aspart TID with meals).   Diet Order:   Diet Order    None      EDUCATION NEEDS:   No education needs have been identified at this time  Skin:  Skin Assessment: Skin Integrity Issues: Skin Integrity Issues::  Incisions Incisions: closed rt arm, rt chest  Last BM:  02/10/20  Height:   Ht Readings from Last 1 Encounters:  02/02/20 5\' 6"  (1.676 m)    Weight:   Wt Readings from Last 1 Encounters:  02/10/20 80.3 kg    Ideal Body Weight:  64.5 kg  BMI:  Body mass index is 28.57 kg/m.  Estimated Nutritional Needs:   Kcal:  2100-2300  Protein:  105-120 grams  Fluid:  1000 ml + UOP    Loistine Chance, RD, LDN, [place] Registered Dietitian II Certified Diabetes Care and Education Specialist Please refer to [facility] for RD and/or RD on-call/weekend/after hours pager

## 2020-02-10 NOTE — Progress Notes (Signed)
[facility]  Subjective  - some swelling at the right arm fistula incision.   Objective (!) 141/56 62 97.9 F (36.6 C) (Oral) 18 95%  Intake/Output Summary (Last 24 hours) at 02/10/2020 0827 Last data filed at 02/10/2020 0357 Gross per 24 hour  Intake 815 ml  Output 470 ml  Net 345 ml    RIJ tunneled catheter c/d/i Right 1st stage basilic vein fistula with good thrill, palpable pulse at wrist  Laboratory Lab Results: Recent Labs    02/08/20 0151 02/09/20 0420  WBC 10.3 11.1*  HGB 7.4* 7.2*  HCT 24.1* 23.7*  PLT 158 163   BMET Recent Labs    02/08/20 0151 02/09/20 0420  NA 141 143  K 3.7 3.9  CL 107 108  CO2 25 24  GLUCOSE 94 107*  BUN 26* 28*  CREATININE 3.53* 3.82*  CALCIUM 7.7* 8.0*    COAG Lab Results  Component Value Date   INR 1.6 (H) 02/09/2020   INR 2.6 (H) 11/14/2019   INR 1.8 (H) 03/21/2019   No results found for: PTT  Assessment/Planning:  Postop day 1 status post exchange of right IJ temporary catheter for tunneled dialysis catheter and placement of a right first stage basilic vein fistula.  Catheter is in good position on x-ray.  He has a good thrill in the right arm fistula and no signs of steal with a palpable radial pulse at the wrist.  He does have a small hematoma at the fistula incision and I would hold heparin today and not restart.  Instructed him to keep his arm elevated.  We will continue to follow.  Marty Heck 02/10/2020 8:27 AM --

## 2020-02-10 NOTE — Procedures (Signed)
Patient was seen on dialysis and the procedure was supervised.  BFR 300  Via TDC BP is  133/57.   Patient appears to be tolerating treatment well  Louis Meckel 02/10/2020

## 2020-02-10 NOTE — TOC Benefit Eligibility Note (Signed)
Transition of Care [facility] Benefit Eligibility Note    Patient Details  Name: Paul Price MRN: [id number] Date of Birth: 1940/07/06   Medication/Dose: Eliquis 13m 2x day is on insurance formulary Apixaban not on formulary  Covered?: Yes  Tier: 3 Drug  Prescription Coverage Preferred Pharmacy: local  Spoke with Person/Company/Phone Number:: Ryan/ [employer] [phone] Co-Pay: $47.00  Prior Approval: No  Deductible: Met       WMarilu Favre RN Phone Number: 02/10/2020, 1:08 PM

## 2020-02-10 NOTE — TOC Benefit Eligibility Note (Signed)
Transition of Care [facility] Benefit Eligibility Note    Patient Details  Name: Paul Price MRN: [id number] Date of Birth: 06/02/1940   Medication/Dose: Eliquis 19m 2x day is on insurance formulary Apixaban not on formulary  Covered?: Yes  Tier: 3 Drug  Prescription Coverage Preferred Pharmacy: local  Spoke with Person/Company/Phone Number:: Ryan/ [employer] [phone] Co-Pay: $47.00  Prior Approval: No  Deductible: Met       FKerin SalenPhone Number: 02/10/2020, 1:03 PM

## 2020-02-11 LAB — BASIC METABOLIC PANEL
Anion gap: 11 (ref 5–15)
BUN: 19 mg/dL (ref 8–23)
CO2: 25 mmol/L (ref 22–32)
Calcium: 7.7 mg/dL — ABNORMAL LOW (ref 8.9–10.3)
Chloride: 105 mmol/L (ref 98–111)
Creatinine, Ser: 3.23 mg/dL — ABNORMAL HIGH (ref 0.61–1.24)
GFR, Estimated: 19 mL/min — ABNORMAL LOW (ref >60)
Glucose, Bld: 84 mg/dL (ref 70–99)
Potassium: 4 mmol/L (ref 3.5–5.1)
Sodium: 141 mmol/L (ref 135–145)

## 2020-02-11 LAB — GLUCOSE, CAPILLARY
Glucose-Capillary: 85 mg/dL (ref 70–99)
Glucose-Capillary: 85 mg/dL (ref 70–99)
Glucose-Capillary: 97 mg/dL (ref 70–99)
Glucose-Capillary: 111 mg/dL — ABNORMAL HIGH (ref 70–99)

## 2020-02-11 LAB — CBC
HCT: 28.5 % — ABNORMAL LOW (ref 39.0–52.0)
Hemoglobin: 8.5 g/dL — ABNORMAL LOW (ref 13.0–17.0)
MCH: 25.9 pg — ABNORMAL LOW (ref 26.0–34.0)
MCHC: 29.8 g/dL — ABNORMAL LOW (ref 30.0–36.0)
MCV: 86.9 fL (ref 80.0–100.0)
Platelets: 145 10*3/uL — ABNORMAL LOW (ref 150–400)
RBC: 3.28 MIL/uL — ABNORMAL LOW (ref 4.22–5.81)
RDW: 21.5 % — ABNORMAL HIGH (ref 11.5–15.5)
WBC: 9.2 10*3/uL (ref 4.0–10.5)
nRBC: 0.3 % — ABNORMAL HIGH (ref 0.0–0.2)

## 2020-02-11 LAB — PARATHYROID HORMONE, INTACT (NO CA): PTH: 66 pg/mL — ABNORMAL HIGH (ref 15–65)

## 2020-02-11 LAB — HEPARIN LEVEL (UNFRACTIONATED)
Heparin Unfractionated: <0.1 IU/mL — ABNORMAL LOW (ref 0.30–0.70)
Heparin Unfractionated: 0.23 IU/mL — ABNORMAL LOW (ref 0.30–0.70)

## 2020-02-11 MED ORDER — DARBEPOETIN ALFA 100 MCG/0.5ML IJ SOSY
100.0000 ug | PREFILLED_SYRINGE | Freq: Once | INTRAMUSCULAR | Status: AC
  Administered 2020-02-11: 100 ug via INTRAVENOUS
  Filled 2020-02-11: qty 0.5

## 2020-02-11 MED ORDER — HEPARIN (PORCINE) 25000 UT/250ML-% IV SOLN
1050.0000 [IU]/h | INTRAVENOUS | Status: DC
  Administered 2020-02-11: 950 [IU]/h via INTRAVENOUS
  Filled 2020-02-11: qty 250

## 2020-02-11 NOTE — Progress Notes (Addendum)
Patient has been accepted at [facility] on a MWF schedule with a seat time of 11:45am. He needs to arrive to his appointments at 11:25am.  On his first day at the clinic, he needs to arrive at 10:45am in order to complete intake paperwork prior to his first treatment.  Navigator updated Nephrologist and Attending. Navigator spoke with patient's wife to inform her of above. Wife asked about payment for HD. Navigator explained that Medicare will cover HD costs. She was very Patent attorney.  Navigator will follow allow for discharge date to assist with smooth transition from hospital to outpatient clinic.   Alphonzo Cruise, [place] Renal Navigator [phone]

## 2020-02-11 NOTE — Progress Notes (Signed)
Physical Therapy Treatment Patient Details Name: Paul Price MRN: [id number] DOB: 1940-03-24 Today's Date: 02/11/2020    History of Present Illness Pt is an 80 y/o male admitted secondary to worsening covid symptoms. Pt with recent admission secondary to GI bleed. PMH includes a fib, DVT, HTN, gout, and OSA on CPAP.    PT Comments    Session focused on bed level exercises. Per PA in secure chat, refrain from use of R UE until seen tomorrow. Instructed patient on therex focusing on B LE strengthening. Encouraged patient to perform 2 more times today to promote LE strengthening. Continue to recommend HHPT following discharge to maximize functional mobility.     Follow Up Recommendations  Home health PT;Supervision/Assistance - 24 hour     Equipment Recommendations  Rolling Ameera Tigue with 5" wheels    Recommendations for Other Services       Precautions / Restrictions Precautions Precautions: Fall Restrictions Weight Bearing Restrictions: No    Mobility  Bed Mobility               General bed mobility comments: deferred this session. Per PA in secure chat, refrain from use of R UE until seen tomorrow.  Transfers                    Ambulation/Gait                 Stairs             Wheelchair Mobility    Modified Rankin (Stroke Patients Only)       Balance                                            Cognition Arousal/Alertness: Awake/alert Behavior During Therapy: WFL for tasks assessed/performed Overall Cognitive Status: Impaired/Different from baseline Area of Impairment: Attention;Memory;Safety/judgement                   Current Attention Level: Selective Memory: Decreased short-term memory   Safety/Judgement: Decreased awareness of deficits;Decreased awareness of safety            Exercises General Exercises - Lower Extremity Ankle Circles/Pumps: AROM;Both;10 reps;Supine Quad Sets: AROM;Both;10  reps;Supine Short Arc Quad: AROM;Both;10 reps;Supine Heel Slides: AROM;Both;10 reps;Supine (2x 10) Hip ABduction/ADduction: AROM;Both;10 reps;Supine (2 x 10) Straight Leg Raises: AROM;Both;Supine;10 reps (2 x 10)    General Comments        Pertinent Vitals/Pain Pain Assessment: Faces Faces Pain Scale: No hurt    Home Living                      Prior Function            PT Goals (current goals can now be found in the care plan section) Acute Rehab PT Goals Patient Stated Goal: home PT Goal Formulation: With patient Time For Goal Achievement: 02/16/20 Potential to Achieve Goals: Fair Progress towards PT goals: Progressing toward goals    Frequency    Min 3X/week      PT Plan Current plan remains appropriate    Co-evaluation              AM-PAC PT "6 Clicks" Mobility   Outcome Measure  Help needed turning from your back to your side while in a flat bed without using bedrails?: None Help needed moving from lying on your  back to sitting on the side of a flat bed without using bedrails?: A Little Help needed moving to and from a bed to a chair (including a wheelchair)?: A Little Help needed standing up from a chair using your arms (e.g., wheelchair or bedside chair)?: A Little Help needed to walk in hospital room?: A Little Help needed climbing 3-5 steps with a railing? : A Lot 6 Click Score: 18    End of Session   Activity Tolerance: Patient tolerated treatment well Patient left: in bed;with call bell/phone within reach Nurse Communication: Mobility status PT Visit Diagnosis: Unsteadiness on feet (R26.81);Muscle weakness (generalized) (M62.81)     Time: 8251-8984 PT Time Calculation (min) (ACUTE ONLY): 19 min  Charges:  $Therapeutic Exercise: 8-22 mins                     Amilee Janvier A. Gilford Rile PT, DPT Acute Rehabilitation Services Pager [phone] Office [phone]    Alda Lea 02/11/2020, 12:25 PM

## 2020-02-11 NOTE — Progress Notes (Addendum)
[facility] Progress Note    Assessment/ Plan:   1. ESRD -previously with AKI on CKD stage V.  HD initiated for uremia with nausea present with a crt in the 4's.  Patient has had session on 1/29.  Had HD #2 yesterday, next HD planned for 2/3.  564mL urine output per I's/O's.  Patient is now set up for Monday Wednesday Friday at [facility] outpatient dialysis to occur once he is medically stable for discharge.  Can discharge from nephrology standpoint, though it appears he has other medical complaints that are being addressed at this time.  If he stays inpatient next HD session will be planned for Friday 2/4 to get on his OP schedule  2. Staph UTI/concern for bacteremia: S/p cefazolin. Blood cultures no growth 5 days.  3. Nausea/emesis/anorexia: Improved after HD.  Likely secondary to uremia.  Patient with no complaints of this today.  4. Anemia: Hgb 8.5 2/2. On Nulecit. S/p one dose feraheme 1/28.  Need to give ESA as well  - Continue to monitor  4. A fib: Cardiac thrombus per cardiology after Echo. Was on heparin gtt which is currently held due to small hematoma at incision from vascular procedure.  Per documentation it looks like he will likely be restarting heparin today, and possibly be using Eliquis afterwards.  5. Renal mass noted on ultrasound last admission. Plans for outpatient urology follow up.  6. PTH mildly elevated at 66. Phos 2.6 on 2/1.  Patient seen and examined, agree with above note with above modifications. New start to dialysis this admission for nausea which has helped a lot.  Has had 2 treatments via [facility] also with s/p first stage BVT.  Has been accepted at [facility] MWF.  OK for discharge from our standpoiint.  If stays will plan for next treatment on Friday to get him on his OP schedule.  Will give ESA    Corliss Parish, MD 02/11/2020      Subjective:   Patient with no complaints this morning.asking about the financial implications of chronic HD    Objective:   BP (!) 144/57 (BP Location: Left Arm)   Pulse 62   Temp 98.1 F (36.7 C) (Oral)   Resp 19   Ht 5\' 6"  (1.676 m)   Wt 80.3 kg   SpO2 97%   BMI 28.57 kg/m   Intake/Output Summary (Last 24 hours) at 02/11/2020 0840 Last data filed at 02/10/2020 2100 Gross per 24 hour  Intake 350 ml  Output 1000 ml  Net -650 ml   Weight change:   Physical Exam: General: Alert and oriented in no apparent distress Heart: S1, S2 present.  Patient's AVF with palpable thrill in right upper arm and bruit present.  Some edema present bilateral lower extremities.  2+ Lungs: CTA bilaterally, no wheezing  Imaging: DG Chest Port 1 View  Result Date: 02/09/2020 CLINICAL DATA:  Right-sided dialysis catheter EXAM: PORTABLE CHEST 1 VIEW COMPARISON:  02/01/2020 FINDINGS: Cardiomegaly with mild pulmonary vascular congestion. Mild patchy opacity in the lingula and left lower lobe, likely atelectasis versus mild perihilar edema. Suspected small bilateral pleural effusions. No pneumothorax. 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 Right chest dual lumen dialysis catheter terminating in the upper right atrium. Median sternotomy. IMPRESSION: Cardiomegaly with mild pulmonary vascular congestion and small bilateral pleural effusions. Mild patchy opacity in the lingula and left lower lobe, likely atelectasis versus mild perihilar edema. Right chest dual lumen dialysis catheter terminating in the  upper right atrium. Electronically Signed   By: Julian Hy M.D.   On: 02/09/2020 16:15   HYBRID OR IMAGING ([facility] ONLY)  Result Date: 02/09/2020 There is no interpretation for this exam.  This order is for images obtained during a surgical procedure.  Please See "Surgeries" Tab for more information regarding the procedure.    Labs: BMET Recent Labs  Lab 02/05/20 0239 02/06/20 0146 02/07/20 0322 02/08/20 0151 02/09/20 0420 02/10/20 0848 02/11/20 0231  NA 145 143  145 141 143 141 141  K 3.4* 4.0 3.6 3.7 3.9 4.1 4.0  CL 114* 113* 115* 107 108 106 105  CO2 22 18* 21* 25 24 25 25   GLUCOSE 102* 109* 97 94 107* 88 84  BUN 52* 49* 45* 26* 28* 19 19  CREATININE 4.44* 4.25* 4.47* 3.53* 3.82* 2.63* 3.23*  CALCIUM 7.9* 7.7* 7.8* 7.7* 8.0* 7.7* 7.7*  PHOS 3.2 2.9  --   --   --  2.6  --    CBC Recent Labs  Lab 02/09/20 0420 02/10/20 0850 02/10/20 2114 02/11/20 0231  WBC 11.1* 9.9 9.5 9.2  HGB 7.2* 8.1* 8.4* 8.5*  HCT 23.7* 28.1* 28.7* 28.5*  MCV 84.6 87.8 87.8 86.9  PLT 163 148* 148* 145*    Medications:    . sodium chloride   Intravenous Once  . amLODipine  2.5 mg Oral Daily  . atorvastatin  20 mg Oral Daily  . Chlorhexidine Gluconate Cloth  6 each Topical Q0600  . Chlorhexidine Gluconate Cloth  6 each Topical Q0600  . feeding supplement  237 mL Oral TID BM  . insulin aspart  0-6 Units Subcutaneous TID WC  . metoprolol succinate  25 mg Oral Daily  . multivitamin  1 tablet Oral QHS  . mupirocin ointment  1 application Nasal BID  . pantoprazole  40 mg Oral BID  . sodium chloride flush  10-40 mL Intracatheter Q12H  . tamsulosin  0.4 mg Oral Daily     Lurline Del, DO [facility] Family Medicine Resident, PGY-2 02/11/2020, 8:40 AM

## 2020-02-11 NOTE — Progress Notes (Addendum)
PROGRESS NOTE    Paul Price  WLN:[id number] DOB: October 30, 1940 DOA: 02/01/2020 PCP: [name], MD    Chief Complaint  Patient presents with  . Shortness of Breath    Brief Narrative:  80 year old with past medical history significant for paroxysmal A. fib, chronic kidney disease, hypertension, hyperlipidemia, BPH who presented to the ED complaining of shortness of breath and nausea.  Patient was recently discharged on 01/26/2020 after admission for upper GI bleed, AKI and Covid pneumonia.  Patient finished a course of Remdesivir  and steroids.  He did not have oxygen requirement at that time.  Patient was evaluated by GI at that time and advised to hold Xarelto until 01/31/2020.  Patient did not resume Xarelto at home.  Patient was discharged with a creatinine of 3.7.  He presents with nausea vomiting, unable to keep anything down.  Patient admitted with poor oral intake, dehydration, hyponatremia, nausea and AKI. He was found to have Staph aureus in Urine. ID was consulted. TEE was done to rule out endocarditis. TEE negative for endocarditis but showed left Atrial appendage thrombus.  Blood culture negative. He was treated with 6 days of Cefazolin.   He was started on HD, to see if that would help with nausea and poor oral intake. He has been feeling better after he was started on HD. Underwent first stage of AVF on 1/31.  He was started on heparin for A fib and left appendage Thrombus. Heparin currently on hold due to hematoma at AV fistula site,.  Subjective:   Denies pain,  Wants his home cpap  Still on full liquid, desires diet advancement  Wife at bedside  Assessment & Plan:   Principal Problem:   AKI (acute kidney injury) ([place]) Active Problems:   HTN (hypertension)   Hyperlipidemia with target LDL less than 70   OSA on CPAP   PAF (paroxysmal atrial fibrillation) (HCC)   COVID-19 virus infection   Intractable nausea and vomiting   Hypernatremia   Elevated  troponin  AKI on CKD Stage IV, now ESRD started on dialysis -Patient with some uremic symptoms: Poor oral intake, nausea.  -Patient started on HD 1/28. Second HD 02/10/2020.  Symptom improved with dialysis -Underwent first stage  Basilic Vein  fistula placement and conversion  to HD tunneled catheter on 1-31 by Dr Carlis Abbott.  -Outpatient dialysis already set up   Staph Aureus in urine.  UTI: urine culture grew  Staph aureus.  Completed cefazolin 6 days.  TEE negative for endocarditis.  Appreciate ID recommendations.  Blood culture obtain 1/26: No growth to date,.   Anemia; iron deficiency;  Received IV iron this admission.  Received one unit PRBC 02/09/2020 per op.  Hb 8.0  A. Fib;  Left Atrium Appendage Thrombus:  -Per GI from last admission ok to resume  Xarelto 1/22. -TEE showed, Left Atrium appendage thrombus.  -Evaluated by cardiologist who agrees with Heparin. If hb remain stable he could be transition to eliquis.  Heparin held post op due to hematoma at fistula site, vascular surgery okay to resume heparin without bolus today  Hypoalbuminemia;  -started ensure.  -Nutritionist consulted.   HFrEF;  Echo 2021 ejection fraction 40 to 45% Monitor volume status is started on dialysis  Nausea and vomiting; KUB negative for bowel obstruction Resolved.   Elevated troponin: Most likely demand ischemia and worsening renal function. Denies chest pain.  Generalized weakness: PT eval.  Hypertension: Continue with Norvasc and Metoprolol.  Hypernatremia:  Sodium peaked at 150 Resolved  With IV fluids.   Hypokalemia: Resolved.   Covid positive, tested 01/20/2020 Completed a course of rhythm severe and IV steroid Per Dr Baxter Flattery, ok to discontinue isolation.   Constipation; received sorbitol. He had 3 large BM last night.    OSA, he does not like hospital CPAP ,want home CPAP, wife going to try to bring it  DVT prophylaxis: SCDs Start: 02/01/20  1943   Code Status: full Family Communication: wife at bedside  Disposition:   Status is: Inpatient   Dispo: The patient is from: Home              Anticipated d/c is to: Home              Anticipated d/c date is: Possibly tomorrow if no more bleeding and cleared by vascular surgery                Consultants:   Nephrology  Vascular surgery  Procedures:   Dialysis Basilic Vein  fistula placement  Antimicrobials:   As above     Objective: Vitals:   02/10/20 0930 02/10/20 1000 02/10/20 1020 02/10/20 1431  BP: (!) 139/54 (!) 140/56 (!) 149/51 (!) 144/57  Pulse:   62 62  Resp:   19 19  Temp:    98.1 F (36.7 C)  TempSrc:   Oral Oral  SpO2:   95% 97%  Weight:   80.3 kg   Height:        Intake/Output Summary (Last 24 hours) at 02/11/2020 0727 Last data filed at 02/10/2020 1433 Gross per 24 hour  Intake 110 ml  Output 1000 ml  Net -890 ml   Filed Weights   02/02/20 1952 02/10/20 0716 02/10/20 1020  Weight: 80.2 kg (P) 80.8 kg 80.3 kg    Examination:  General exam: calm, NAD Respiratory system: Clear to auscultation. Respiratory effort normal. Cardiovascular system: S1 & S2 heard, RRR. No JVD, no murmur, No pedal edema. Gastrointestinal system: Abdomen is nondistended, soft and nontender.  Normal bowel sounds heard. Central nervous system: Alert and oriented. No focal neurological deficits. Extremities: Right arm wrapped, no evidence of bleeding Skin: No rashes, lesions or ulcers Psychiatry: Judgement and insight appear normal. Mood & affect appropriate.     Data Reviewed: I have personally reviewed following labs and imaging studies  CBC: Recent Labs  Lab 02/08/20 0151 02/09/20 0420 02/10/20 0850 02/10/20 2114 02/11/20 0231  WBC 10.3 11.1* 9.9 9.5 9.2  HGB 7.4* 7.2* 8.1* 8.4* 8.5*  HCT 24.1* 23.7* 28.1* 28.7* 28.5*  MCV 82.3 84.6 87.8 87.8 86.9  PLT 158 163 148* 148* 145*    Basic Metabolic Panel: Recent Labs  Lab 02/05/20 0239  02/06/20 0146 02/07/20 0322 02/08/20 0151 02/09/20 0420 02/10/20 0848 02/11/20 0231  NA 145 143 145 141 143 141 141  K 3.4* 4.0 3.6 3.7 3.9 4.1 4.0  CL 114* 113* 115* 107 108 106 105  CO2 22 18* 21* 25 24 25 25   GLUCOSE 102* 109* 97 94 107* 88 84  BUN 52* 49* 45* 26* 28* 19 19  CREATININE 4.44* 4.25* 4.47* 3.53* 3.82* 2.63* 3.23*  CALCIUM 7.9* 7.7* 7.8* 7.7* 8.0* 7.7* 7.7*  PHOS 3.2 2.9  --   --   --  2.6  --     GFR: Estimated Creatinine Clearance: 17.9 mL/min (A) (by C-G formula based on SCr of 3.23 mg/dL (H)).  Liver Function Tests: Recent Labs  Lab 02/05/20 0239 02/06/20 0146 02/10/20 0848  ALBUMIN 1.8* 1.7*  1.7*    CBG: Recent Labs  Lab 02/09/20 1745 02/09/20 2105 02/10/20 0641 02/10/20 1149 02/10/20 2213  GLUCAP 113* 91 86 112* 107*     Recent Results (from the past 240 hour(s))  Urine culture     Status: Abnormal   Collection Time: 02/01/20  5:13 PM   Specimen: Urine, Random  Result Value Ref Range Status   Specimen Description URINE, RANDOM  Final   Special Requests   Final    NONE Performed at [facility], [place] [address] [place], [place] [address]    Culture >=100,000 COLONIES/mL STAPHYLOCOCCUS AUREUS (A)  Final   Report Status 02/05/2020 FINAL  Final   Organism ID, Bacteria STAPHYLOCOCCUS AUREUS (A)  Final      Susceptibility   Staphylococcus aureus - MIC*    CIPROFLOXACIN <=0.5 SENSITIVE Sensitive     GENTAMICIN <=0.5 SENSITIVE Sensitive     NITROFURANTOIN <=16 SENSITIVE Sensitive     OXACILLIN <=0.25 SENSITIVE Sensitive     TETRACYCLINE <=1 SENSITIVE Sensitive     VANCOMYCIN <=0.5 SENSITIVE Sensitive     TRIMETH/SULFA <=10 SENSITIVE Sensitive     CLINDAMYCIN <=0.25 SENSITIVE Sensitive     RIFAMPIN <=0.5 SENSITIVE Sensitive     Inducible Clindamycin NEGATIVE Sensitive     * >=100,000 COLONIES/mL STAPHYLOCOCCUS AUREUS  Culture, blood (Routine X 2) w Reflex to ID Panel     Status: None   Collection Time: 02/04/20  4:40 PM    Specimen: BLOOD  Result Value Ref Range Status   Specimen Description BLOOD RIGHT ANTECUBITAL  Final   Special Requests   Final    BOTTLES DRAWN AEROBIC ONLY Blood Culture adequate volume   Culture   Final    NO GROWTH 5 DAYS Performed at [facility], [place] [address], [place], [place] [address]    Report Status 02/09/2020 FINAL  Final  Culture, blood (Routine X 2) w Reflex to ID Panel     Status: None   Collection Time: 02/04/20  4:46 PM   Specimen: BLOOD  Result Value Ref Range Status   Specimen Description BLOOD RIGHT ANTECUBITAL  Final   Special Requests   Final    BOTTLES DRAWN AEROBIC ONLY Blood Culture adequate volume   Culture   Final    NO GROWTH 5 DAYS Performed at [facility], [place] [address] [place], [place] [address]    Report Status 02/09/2020 FINAL  Final  Surgical PCR screen     Status: Abnormal   Collection Time: 02/09/20  7:23 AM   Specimen: Nasal Mucosa; Nasal Swab  Result Value Ref Range Status   MRSA, PCR NEGATIVE NEGATIVE Final   Staphylococcus aureus POSITIVE (A) NEGATIVE Final    Comment: (NOTE) The Xpert SA Assay (FDA approved for NASAL specimens in patients 40 years of age and older), is one component of a comprehensive surveillance program. It is not intended to diagnose infection nor to guide or monitor treatment. Performed at [facility], [place] [address] [place], [place] [address]          Radiology Studies: DG Chest Port 1 View  Result Date: 02/09/2020 CLINICAL DATA:  Right-sided dialysis catheter EXAM: PORTABLE CHEST 1 VIEW COMPARISON:  02/01/2020 FINDINGS: Cardiomegaly with mild pulmonary vascular congestion. Mild patchy opacity in the lingula and left lower lobe, likely atelectasis versus mild perihilar edema. Suspected small bilateral pleural effusions. No pneumothorax. 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 Right chest  dual lumen dialysis catheter terminating in the  upper right atrium. Median sternotomy. IMPRESSION: Cardiomegaly with mild pulmonary vascular congestion and small bilateral pleural effusions. Mild patchy opacity in the lingula and left lower lobe, likely atelectasis versus mild perihilar edema. Right chest dual lumen dialysis catheter terminating in the upper right atrium. Electronically Signed   By: Julian Hy M.D.   On: 02/09/2020 16:15   HYBRID OR IMAGING ([facility] ONLY)  Result Date: 02/09/2020 There is no interpretation for this exam.  This order is for images obtained during a surgical procedure.  Please See "Surgeries" Tab for more information regarding the procedure.        Scheduled Meds: . sodium chloride   Intravenous Once  . amLODipine  2.5 mg Oral Daily  . atorvastatin  20 mg Oral Daily  . Chlorhexidine Gluconate Cloth  6 each Topical Q0600  . Chlorhexidine Gluconate Cloth  6 each Topical Q0600  . feeding supplement  237 mL Oral TID BM  . insulin aspart  0-6 Units Subcutaneous TID WC  . metoprolol succinate  25 mg Oral Daily  . multivitamin  1 tablet Oral QHS  . mupirocin ointment  1 application Nasal BID  . pantoprazole  40 mg Oral BID  . sodium chloride flush  10-40 mL Intracatheter Q12H  . tamsulosin  0.4 mg Oral Daily   Continuous Infusions: . sodium chloride    . sodium chloride    . sodium chloride 10 mL/hr at 02/09/20 1741  . ferric gluconate (FERRLECIT/NULECIT) IV 125 mg (02/10/20 1342)     LOS: 9 days   Time spent: 25 mins Greater than 50% of this time was spent in counseling, explanation of diagnosis, planning of further management, and coordination of care.  I have personally reviewed and interpreted on  02/11/2020 daily labs, tele strips, imagings as discussed above under date review session and assessment and plans.  I reviewed all nursing notes, pharmacy notes, consultant notes,  vitals, pertinent old records  I have discussed plan of care as described above with RN , patient and family on  02/11/2020  Voice Recognition /Dragon dictation system was used to create this note, attempts have been made to correct errors. Please contact the author with questions and/or clarifications.   Florencia Reasons, MD PhD FACP [facility]  Available via Epic secure chat 7am-7pm for nonurgent issues Please page for urgent issues To page the attending provider between 7A-7P or the covering provider during after hours 7P-7A, please log into the web site [email or web address] and access using universal [place] password for that web site. If you do not have the password, please call the hospital operator.    02/11/2020, 7:27 AM

## 2020-02-11 NOTE — Progress Notes (Signed)
[facility] for heparin Indication: atrial fibrillation and atrial thrombus  Allergies  Allergen Reactions  . Simvastatin Other (See Comments)    Abdominal cramps  . Nitrostat [Nitroglycerin] Other (See Comments)    Causes blood pressure to "bottom out"    Patient Measurements: Height: 5\' 6"  (167.6 cm) Weight: 80.3 kg (177 lb 0.5 oz) IBW/kg (Calculated) : 63.8 Heparin Dosing Weight: 80 kg  Vital Signs: Temp: 97.7 F (36.5 C) (02/02 2050) Temp Source: Oral (02/02 2050) BP: 130/55 (02/02 2050) Pulse Rate: 64 (02/02 2050)  Labs: Recent Labs    02/09/20 0418 02/09/20 0420 02/09/20 0420 02/10/20 0848 02/10/20 0850 02/10/20 2114 02/11/20 0231 02/11/20 2229  HGB  --  7.2*   < >  --  8.1* 8.4* 8.5*  --   HCT  --  23.7*   < >  --  28.1* 28.7* 28.5*  --   PLT  --  163   < >  --  148* 148* 145*  --   LABPROT  --  18.1*  --   --   --   --   --   --   INR  --  1.6*  --   --   --   --   --   --   HEPARINUNFRC 0.34  --   --   --   --   --  <0.10* 0.23*  CREATININE  --  3.82*  --  2.63*  --   --  3.23*  --    < > = values in this interval not displayed.    Estimated Creatinine Clearance: 17.9 mL/min (A) (by C-G formula based on SCr of 3.23 mg/dL (H)).   Assessment: Pt was previously on Xarelto for his AF but this was held early Jan to 01/31/20 for GIB. 1/28 TTE showed LA thrombus. Pharmacy consulted for IV heparin.  Heparin was held for R IJ TDC and fistula on 1/31 and continued to be held 2/1 due to small hematoma at fistula incision - restarted 2/2 with no bolus.    Heparin level 0.23 (subtherapeutic) on gtt at 950 units/hr. No issues with line or bleeding reported per RN.  Goal of Therapy:  Heparin level 0.3-0.5 units/ml (Decreased for recent GIB and hematoma present) Monitor platelets by anticoagulation protocol: Yes   Plan:  Increase heparin to 1050 units/hr Will f/u 8 hr heparin level  Sherlon Handing, PharmD, BCPS Please see amion  for complete clinical pharmacist phone list 02/11/2020 11:12 PM

## 2020-02-11 NOTE — Progress Notes (Signed)
Nutrition Follow-up  DOCUMENTATION CODES:   Not applicable  INTERVENTION:   -Continue Ensure Enlive po TID, each supplement provides 350 kcal and 20 grams of protein -Continue Renal MVI daily -Magic cup TID with meals, each supplement provides 290 kcal and 9 grams of protein -Provided "[place] for Healthy Eating with Kidney Disease"; attached to AVS/ discharge summary  NUTRITION DIAGNOSIS:   Inadequate oral intake related to decreased appetite as evidenced by meal completion < 50%.  Ongoing  GOAL:   Patient will meet greater than or equal to 90% of their needs  Progressing   MONITOR:   PO intake,Supplement acceptance,Diet advancement,Labs,Weight trends,Skin,I & O's  REASON FOR ASSESSMENT:   Consult Assessment of nutrition requirement/status  ASSESSMENT:   80 year old with past medical history significant for paroxysmal A. fib, chronic kidney disease, hypertension, hyperlipidemia, BPH who presented to the ED complaining of shortness of breath and nausea.  1/28- s/p TEE- revealed no evidence of endocarditis  1/28- first HD  1/31- s/p 1. Exchange of right internal jugular vein temporary dialysis catheter for a tunneled dialysis catheter (19 cm palindrome) 2.Rightfirst stage basilic vein transposition (brachiobasilic arteriovenous fistula) placement 2/1- second HD  Reviewed I/O's: -650 ml x 24 hours and +5.4 L since admission  UOP: 500 ml x 24 hours  Spoke with pt wife. She requested RD not wake pt, as he just started to rest. He has not eaten lunch yet.   Per wife, pt has had a decreased appetite over the past 6 weeks. PTA pt has been experiencing diarrhea and early satiety. Per wife, diarrhea has resolved, but he is still not eating much despite encouragement. Per pt wife, he is consuming bites at meals as well as Ensure supplements. Pt wife also concerned that pt did not receive a breakfast tray- she is requesting automatic meals for pt so he does not  have to call down to receive meal trays.   Pt pt wife, pt has lost weight, but is unsure of UBW or how much weight he has lost. Nutrition-focused physical exam deferred per her request.   Discussed ways pt could maximize oral intake. Reviewed supplements on formulary- wife would like to continue with Ensure and try Magic Cups.   Labs reviewed: CBGS: 85-107 (inpatient orders for glycemic control are 0-6 units insulin aspart TID with meals).   Diet Order:   Diet Order            Diet renal with fluid restriction Fluid restriction: 1200 mL Fluid; Room service appropriate? No; Fluid consistency: Thin  Diet effective now                 EDUCATION NEEDS:   No education needs have been identified at this time  Skin:  Skin Assessment: Skin Integrity Issues: Skin Integrity Issues:: Incisions Incisions: closed rt arm, rt chest  Last BM:  02/10/20  Height:   Ht Readings from Last 1 Encounters:  02/02/20 5\' 6"  (1.676 m)    Weight:   Wt Readings from Last 1 Encounters:  02/10/20 80.3 kg    Ideal Body Weight:  64.5 kg  BMI:  Body mass index is 28.57 kg/m.  Estimated Nutritional Needs:   Kcal:  2100-2300  Protein:  105-120 grams  Fluid:  1000 ml + UOP    Loistine Chance, RD, LDN, [place] Registered Dietitian II Certified Diabetes Care and Education Specialist Please refer to [facility] for RD and/or RD on-call/weekend/after hours pager

## 2020-02-11 NOTE — Progress Notes (Signed)
ANTICOAGULATION CONSULT NOTE - Initial Consult  Pharmacy Consult for heparin Indication: atrial fibrillation and atrial thrombus  Allergies  Allergen Reactions  . Simvastatin Other (See Comments)    Abdominal cramps  . Nitrostat [Nitroglycerin] Other (See Comments)    Causes blood pressure to "bottom out"    Patient Measurements: Height: 5\' 6"  (167.6 cm) Weight: 80.3 kg (177 lb 0.5 oz) IBW/kg (Calculated) : 63.8 Heparin Dosing Weight: 80 kg  Vital Signs:    Labs: Recent Labs    02/09/20 0418 02/09/20 0420 02/09/20 0420 02/10/20 0848 02/10/20 0850 02/10/20 2114 02/11/20 0231  HGB  --  7.2*   < >  --  8.1* 8.4* 8.5*  HCT  --  23.7*   < >  --  28.1* 28.7* 28.5*  PLT  --  163   < >  --  148* 148* 145*  LABPROT  --  18.1*  --   --   --   --   --   INR  --  1.6*  --   --   --   --   --   HEPARINUNFRC 0.34  --   --   --   --   --  <0.10*  CREATININE  --  3.82*  --  2.63*  --   --  3.23*   < > = values in this interval not displayed.    Estimated Creatinine Clearance: 17.9 mL/min (A) (by C-G formula based on SCr of 3.23 mg/dL (H)).   Assessment: Pt was previously on Xarelto for his AF but this was held early Jan to 01/31/20 for GIB. 1/28 TTE showed LA thrombus. Pharmacy consulted for IV heparin.  Heparin was held for R IJ TDC and fistula on 1/31 and continued to be held 2/1 due to small hematoma at fistula incision.   Pharmacy consulted to resume heparin now with no bolus. Patient was previously therapeutic on heparin 950 units/hr. Hgb 8.5. Plt 145. Small hematoma at fistula still present.   Goal of Therapy:  Heparin level 0.3-0.5 units/ml (Decreased for recent GIB and hematoma present) Monitor platelets by anticoagulation protocol: Yes   Plan:  Start heparin at 950 units/hr No bolus  Check heparin level at 19000 Monitor heparin level, CBC, and S/S of bleeding daily  F/u anticoagulant plan   Cristela Felt, PharmD Clinical Pharmacist

## 2020-02-11 NOTE — Progress Notes (Addendum)
Progress Note    02/11/2020 7:36 AM 2 Days Post-Op  Subjective:  Sleeping soundly; awakens easily. Denies hand pain or numbness   Vitals:   02/10/20 1020 02/10/20 1431  BP: (!) 149/51 (!) 144/57  Pulse: 62 62  Resp: 19 19  Temp:  98.1 F (36.7 C)  SpO2: 95% 97%    Physical Exam: General appearance: Awake, alert in no apparent distress Cardiac: Heart rate and rhythm are regular Respirations: Nonlabored Incisions: Right AC incision covered with foam border dressing>>dry. No bleeding.  Extremities: RUE: Good bruit and thrill in fistula. Ecchymosis of volar aspect of arm; soft to palpation. 2+ radial pulse Right IJ TDC site clean and dry.   CBC    Component Value Date/Time   WBC 9.2 02/11/2020 0231   RBC 3.28 (L) 02/11/2020 0231   HGB 8.5 (L) 02/11/2020 0231   HGB 7.7 (L) 10/20/2019 0924   HCT 28.5 (L) 02/11/2020 0231   HCT 39.9 10/01/2018 1130   PLT 145 (L) 02/11/2020 0231   PLT 146 (L) 10/20/2019 0924   MCV 86.9 02/11/2020 0231   MCH 25.9 (L) 02/11/2020 0231   MCHC 29.8 (L) 02/11/2020 0231   RDW 21.5 (H) 02/11/2020 0231   LYMPHSABS 0.7 02/01/2020 1618   MONOABS 1.0 02/01/2020 1618   EOSABS 0.1 02/01/2020 1618   BASOSABS 0.0 02/01/2020 1618    BMET    Component Value Date/Time   NA 141 02/11/2020 0231   NA 146 (H) 11/22/2016 1057   K 4.0 02/11/2020 0231   CL 105 02/11/2020 0231   CO2 25 02/11/2020 0231   GLUCOSE 84 02/11/2020 0231   BUN 19 02/11/2020 0231   BUN 44 (H) 11/22/2016 1057   CREATININE 3.23 (H) 02/11/2020 0231   CREATININE 3.18 (HH) 10/20/2019 0924   CREATININE 1.79 (H) 10/27/2015 0803   CALCIUM 7.7 (L) 02/11/2020 0231   GFRNONAA 19 (L) 02/11/2020 0231   GFRNONAA 17 (L) 10/20/2019 0924   GFRNONAA 36 (L) 10/27/2015 0803   GFRAA 20 (L) 08/12/2019 0959   GFRAA 41 (L) 10/27/2015 0803     Intake/Output Summary (Last 24 hours) at 02/11/2020 0736 Last data filed at 02/10/2020 2100 Gross per 24 hour  Intake 350 ml  Output 1000 ml  Net -650 ml     HOSPITAL MEDICATIONS Scheduled Meds: . sodium chloride   Intravenous Once  . amLODipine  2.5 mg Oral Daily  . atorvastatin  20 mg Oral Daily  . Chlorhexidine Gluconate Cloth  6 each Topical Q0600  . Chlorhexidine Gluconate Cloth  6 each Topical Q0600  . feeding supplement  237 mL Oral TID BM  . insulin aspart  0-6 Units Subcutaneous TID WC  . metoprolol succinate  25 mg Oral Daily  . multivitamin  1 tablet Oral QHS  . mupirocin ointment  1 application Nasal BID  . pantoprazole  40 mg Oral BID  . sodium chloride flush  10-40 mL Intracatheter Q12H  . tamsulosin  0.4 mg Oral Daily   Continuous Infusions: . sodium chloride    . sodium chloride    . sodium chloride 10 mL/hr at 02/09/20 1741  . ferric gluconate (FERRLECIT/NULECIT) IV 125 mg (02/10/20 1342)   PRN Meds:.sodium chloride, sodium chloride, acetaminophen **OR** acetaminophen, alteplase, heparin, lidocaine (PF), lidocaine-prilocaine, ondansetron **OR** ondansetron (ZOFRAN) IV, oxyCODONE, pentafluoroprop-tetrafluoroeth, polyethylene glycol, promethazine, sodium chloride flush  Assessment and Plan: POD 2 right 1st stage BVT.  Heparin held due to small hematoma at incision. Likely restart today>>pending Dr. Ainsley Spinner assessment  Covid treatment completed; isolation discontinued.    Risa Grill, PA-C [facility] [phone] 02/11/2020  7:36 AM   I have seen and evaluated the patient. I agree with the PA note as documented above.  Status post right IJ TDC placement and states catheter worked well yesterday in dialysis.  Also got a right first stage basilic vein in the right arm.  He does have a small hematoma but a good thrill in the fistula.  I wrapped his arm with a gentle Ace wrap and I think it would be okay to resume heparin without bolus.  We will continue to follow.  Marty Heck, MD [facility] [place] Office: [phone]

## 2020-02-11 NOTE — TOC Initial Note (Addendum)
Transition of Care [facility]) - Initial/Assessment Note    Patient Details  Name: Paul Price MRN: [id number] Date of Birth: Mar 21, 1940  Transition of Care [facility]) CM/SW Contact:    Marilu Favre, RN Phone Number: 02/11/2020, 3:47 PM  Clinical Narrative:                 Patient from home with wife.   Patient active with [facility] for HHRN,PT,OT and wants to continue. Orders entered and Amy with [facility] aware.   Patient already has a walker at home, and does not want a 3 in 1.   Discussed Eliquis 30 day free card and provided same. Explained benefit check for cost after 30 day free card. Wife voiced understanding.   Wife prefers to use [facility] here at [facility]. NCM changed pharmacy to [facility], wife aware TOC open hours.   Will continue to follow for any further discharge needs.   Expected Discharge Plan: [place] Barriers to Discharge: Continued Medical Work up   Patient Goals and CMS Choice Patient states their goals for this hospitalization and ongoing recovery are:: to return to home CMS Medicare.gov Compare Post Acute Care list provided to:: Patient Choice offered to / list presented to : [facility]  Expected Discharge Plan and Services Expected Discharge Plan: [place]   Discharge Planning Services: CM Consult Post Acute Care Choice: [place] arrangements for the past 2 months: Single Family Home                 DME Arranged: N/A         HH Arranged: RN,PT,OT [place] Agency: [facility] Date [place]: 02/11/20 Time [place]: 60 Representative spoke with at [place]: [place] Arrangements/Services Living arrangements for the past 2 months: [place] with:: Spouse Patient language and need for interpreter reviewed:: Yes Do you feel safe going back to the place where you live?: Yes      Need for Family Participation in Patient Care: Yes  (Comment) Care giver support system in place?: Yes (comment) Current home services: DME Criminal Activity/Legal Involvement Pertinent to Current Situation/Hospitalization: No - Comment as needed  Activities of Daily Living Home Assistive Devices/Equipment: Cane (specify quad or straight) ADL Screening (condition at time of admission) Patient's cognitive ability adequate to safely complete daily activities?: Yes Is the patient deaf or have difficulty hearing?: No Does the patient have difficulty seeing, even when wearing glasses/contacts?: No Does the patient have difficulty concentrating, remembering, or making decisions?: No Patient able to express need for assistance with ADLs?: Yes Does the patient have difficulty dressing or bathing?: No Independently performs ADLs?: Yes (appropriate for developmental age) Does the patient have difficulty walking or climbing stairs?: No Weakness of Legs: None Weakness of Arms/Hands: None  Permission Sought/Granted   Permission granted to share information with : Yes, Verbal Permission Granted  Share Information with NAME: [name]  Permission granted to share info w AGENCY: [facility]        Emotional Assessment Appearance:: Appears stated age Attitude/Demeanor/Rapport: Engaged Affect (typically observed): Accepting Orientation: : Oriented to Situation,Oriented to Self,Oriented to Place,Oriented to  Time Alcohol / Substance Use: Not Applicable Psych Involvement: No (comment)  Admission diagnosis:  Hypernatremia [E87.0] SOB (shortness of breath) [R06.02] AKI (acute kidney injury) ([place]) [N17.9] COVID-19 [U07.1] Patient Active Problem List   Diagnosis Date Noted  . Intractable nausea and vomiting 02/01/2020  .  Hypernatremia 02/01/2020  . Elevated troponin 02/01/2020  . UGI bleed 01/22/2020  . COVID-19 virus infection 01/21/2020  . Upper GI bleed 01/20/2020  . Melena 11/16/2019  . Symptomatic anemia 11/14/2019  . Stage 5 chronic  kidney disease ([place]) 11/14/2019  . Anemia in chronic kidney disease 08/18/2019  . Renal mass 03/31/2019  . Acute on chronic systolic CHF (congestive heart failure) ([place])   . 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 Symptomatic bradycardia 12/21/2016  . Systolic congestive heart failure ([place]) 12/20/2016  . Normocytic anemia 09/06/2016  . Upper GI bleeding 09/06/2016  . Acute blood loss anemia   . New onset a-fib ([place]) 08/08/2016  . Chronic diastolic (congestive) heart failure ([place]) 08/08/2016  . Hypotension 08/08/2016  . A-fib ([place]) 08/08/2016  . Primary gout   . 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 Syncope 12/02/2013  . Left leg DVT ([place]) 12/02/2013  . Fever 12/02/2013  . AKI (acute kidney injury) ([place]) 12/02/2013  . Renal insufficiency 07/02/2013  . PAF (paroxysmal atrial fibrillation) ([place]) 01/04/2013  . Sinus pause, 4.3 seconds.  Recorded by [employer]. 12/02/2012  . NSVT (nonsustained ventricular tachycardia) ([place]) 12/02/2012  . Dyspnea on exertion 11/20/2012  . 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 HTN (hypertension) 08/20/2012  . Hyperlipidemia with target LDL less than 70 08/20/2012  . Obesity (BMI 30-39.9) 08/20/2012  . OSA on CPAP 08/20/2012  . RBBB 08/20/2012   PCP:  [name], MD Pharmacy:   [facility], [address] [place] [address] Phone: [phone] Fax: [phone]  [facility] [place], [place] [address] [place] [address] Phone: [phone] Fax: [phone]     Social Determinants of Health (SDOH) Interventions    Readmission Risk Interventions No flowsheet data found.

## 2020-02-12 ENCOUNTER — Other Ambulatory Visit: Payer: Self-pay

## 2020-02-12 DIAGNOSIS — D3011 Benign neoplasm of right renal pelvis: Secondary | ICD-10-CM | POA: Insufficient documentation

## 2020-02-12 DIAGNOSIS — Z2839 Other underimmunization status: Secondary | ICD-10-CM | POA: Insufficient documentation

## 2020-02-12 DIAGNOSIS — R52 Pain, unspecified: Secondary | ICD-10-CM | POA: Insufficient documentation

## 2020-02-12 DIAGNOSIS — T782XXA Anaphylactic shock, unspecified, initial encounter: Secondary | ICD-10-CM | POA: Insufficient documentation

## 2020-02-12 DIAGNOSIS — E7800 Pure hypercholesterolemia, unspecified: Secondary | ICD-10-CM | POA: Insufficient documentation

## 2020-02-12 DIAGNOSIS — Z992 Dependence on renal dialysis: Secondary | ICD-10-CM | POA: Insufficient documentation

## 2020-02-12 DIAGNOSIS — L299 Pruritus, unspecified: Secondary | ICD-10-CM | POA: Insufficient documentation

## 2020-02-12 DIAGNOSIS — T829XXA Unspecified complication of cardiac and vascular prosthetic device, implant and graft, initial encounter: Secondary | ICD-10-CM | POA: Insufficient documentation

## 2020-02-12 DIAGNOSIS — N186 End stage renal disease: Secondary | ICD-10-CM | POA: Insufficient documentation

## 2020-02-12 DIAGNOSIS — T7840XA Allergy, unspecified, initial encounter: Secondary | ICD-10-CM | POA: Insufficient documentation

## 2020-02-12 DIAGNOSIS — I2510 Atherosclerotic heart disease of native coronary artery without angina pectoris: Secondary | ICD-10-CM | POA: Insufficient documentation

## 2020-02-12 DIAGNOSIS — N2581 Secondary hyperparathyroidism of renal origin: Secondary | ICD-10-CM | POA: Insufficient documentation

## 2020-02-12 DIAGNOSIS — D509 Iron deficiency anemia, unspecified: Secondary | ICD-10-CM | POA: Insufficient documentation

## 2020-02-12 DIAGNOSIS — N185 Chronic kidney disease, stage 5: Secondary | ICD-10-CM

## 2020-02-12 DIAGNOSIS — A388 Scarlet fever with other complications: Secondary | ICD-10-CM | POA: Insufficient documentation

## 2020-02-12 DIAGNOSIS — Z111 Encounter for screening for respiratory tuberculosis: Secondary | ICD-10-CM | POA: Insufficient documentation

## 2020-02-12 DIAGNOSIS — K219 Gastro-esophageal reflux disease without esophagitis: Secondary | ICD-10-CM | POA: Insufficient documentation

## 2020-02-12 DIAGNOSIS — D689 Coagulation defect, unspecified: Secondary | ICD-10-CM | POA: Insufficient documentation

## 2020-02-12 LAB — CBC
HCT: 27.9 % — ABNORMAL LOW (ref 39.0–52.0)
Hemoglobin: 8.7 g/dL — ABNORMAL LOW (ref 13.0–17.0)
MCH: 26.7 pg (ref 26.0–34.0)
MCHC: 31.2 g/dL (ref 30.0–36.0)
MCV: 85.6 fL (ref 80.0–100.0)
Platelets: 141 10*3/uL — ABNORMAL LOW (ref 150–400)
RBC: 3.26 MIL/uL — ABNORMAL LOW (ref 4.22–5.81)
RDW: 22.4 % — ABNORMAL HIGH (ref 11.5–15.5)
WBC: 9.4 10*3/uL (ref 4.0–10.5)
nRBC: 0.2 % (ref 0.0–0.2)

## 2020-02-12 LAB — RENAL FUNCTION PANEL
Albumin: 1.8 g/dL — ABNORMAL LOW (ref 3.5–5.0)
Anion gap: 12 (ref 5–15)
BUN: 26 mg/dL — ABNORMAL HIGH (ref 8–23)
CO2: 22 mmol/L (ref 22–32)
Calcium: 7.6 mg/dL — ABNORMAL LOW (ref 8.9–10.3)
Chloride: 107 mmol/L (ref 98–111)
Creatinine, Ser: 3.58 mg/dL — ABNORMAL HIGH (ref 0.61–1.24)
GFR, Estimated: 16 mL/min — ABNORMAL LOW (ref >60)
Glucose, Bld: 93 mg/dL (ref 70–99)
Phosphorus: 3.2 mg/dL (ref 2.5–4.6)
Potassium: 4.2 mmol/L (ref 3.5–5.1)
Sodium: 141 mmol/L (ref 135–145)

## 2020-02-12 LAB — GLUCOSE, CAPILLARY
Glucose-Capillary: 128 mg/dL — ABNORMAL HIGH (ref 70–99)
Glucose-Capillary: 123 mg/dL — ABNORMAL HIGH (ref 70–99)

## 2020-02-12 LAB — HEPARIN LEVEL (UNFRACTIONATED): Heparin Unfractionated: 0.37 IU/mL (ref 0.30–0.70)

## 2020-02-12 MED ORDER — APIXABAN 5 MG PO TABS: 5.0000 mg | ORAL_TABLET | Freq: Two times a day (BID) | ORAL | Status: DC

## 2020-02-12 MED ORDER — APIXABAN 5 MG PO TABS
10.0000 mg | ORAL_TABLET | Freq: Two times a day (BID) | ORAL | Status: DC
  Administered 2020-02-12: 10 mg via ORAL
  Filled 2020-02-12: qty 2

## 2020-02-12 MED ORDER — APIXABAN 5 MG PO TABS: ORAL_TABLET | ORAL | 0 refills | Status: DC

## 2020-02-12 MED ORDER — FERROUS SULFATE 325 (65 FE) MG PO TABS: 325.0000 mg | ORAL_TABLET | Freq: Every day | ORAL | 0 refills | Status: DC

## 2020-02-12 MED FILL — ELIQUIS 5 MG TABLET: 5 | 30 days supply | Qty: 60 | Fill #0

## 2020-02-12 NOTE — Consult Note (Signed)
   [facility] CM Inpatient Consult   02/12/2020  Paul Price 1940/07/30 [id number]   [place] [facility] [ACO] Patient: Paul Price  Follow up: Reviewed for post hospital follow up needs.  Call attempted to hospital phone and no answer, patient with extreme high risk score, new medications, new HD. Home Health noted with [facility]  Plan:  Assign to [place] for post hospital follow up.  For questions,  Natividad Brood, RN BSN [place] [facility] Liaison  [phone] business mobile phone Toll free office [phone]  Fax number: [phone] [email or web address]

## 2020-02-12 NOTE — Progress Notes (Signed)
Paul Price to be D/C'd  per MD order. Discussed with the patient and all questions fully answered.  VSS, Skin clean, dry and intact without evidence of skin break down, no evidence of skin tears noted.  IV catheter discontinued intact. Site without signs and symptoms of complications. Dressing and pressure applied.  An After Visit Summary was printed and given to the patient. Patient received prescription.  D/c education completed with patient/family including follow up instructions, medication list, d/c activities limitations if indicated, with other d/c instructions as indicated by MD - patient able to verbalize understanding, all questions fully answered.   Patient instructed to return to ED, call 911, or call MD for any changes in condition.   Patient to be escorted via [place], and D/C home via private auto.

## 2020-02-12 NOTE — Progress Notes (Signed)
Increased Heparin dose from 9.59ml/hr to 10.54ml/hr equivalent to 1,050units/hr per Pharmacy.  Will continue to monitor.

## 2020-02-12 NOTE — Progress Notes (Addendum)
[place] for heparin Indication: atrial fibrillation and atrial thrombus  Allergies  Allergen Reactions  . Simvastatin Other (See Comments)    Abdominal cramps  . Nitrostat [Nitroglycerin] Other (See Comments)    Causes blood pressure to "bottom out"    Patient Measurements: Height: 5\' 6"  (167.6 cm) Weight: 80.3 kg (177 lb 0.5 oz) IBW/kg (Calculated) : 63.8 Heparin Dosing Weight: 80 kg  Vital Signs:    Labs: Recent Labs    02/10/20 0848 02/10/20 0850 02/10/20 2114 02/11/20 0231 02/11/20 2229 02/12/20 0149  HGB  --    < > 8.4* 8.5*  --  8.7*  HCT  --    < > 28.7* 28.5*  --  27.9*  PLT  --    < > 148* 145*  --  141*  HEPARINUNFRC  --   --   --  <0.10* 0.23*  --   CREATININE 2.63*  --   --  3.23*  --  3.58*   < > = values in this interval not displayed.    Estimated Creatinine Clearance: 16.1 mL/min (A) (by C-G formula based on SCr of 3.58 mg/dL (H)).   Assessment: Pt was previously on Xarelto for his AF but this was held early Jan to 01/31/20 for GIB. 1/28 TTE showed LAA thrombus. Pharmacy consulted for IV heparin.  Heparin was held for R IJ TDC and fistula on 1/31 and continued to be held 2/1 due to small hematoma at fistula incision - restarted 2/2 with no bolus.    Heparin level drawn at 7:30 couldn't be processed, repeat pending at 10:00 but MD ordered transition to apixaban and planning discharge home today. Received 5 days of therapeutic heparin at low end given RUE hematoma, no active bleed and improving per surgery.  Goal of Therapy:  Heparin level 0.3-0.5 units/ml (Decreased for recent GIB and hematoma present) Monitor platelets by anticoagulation protocol: Yes   Plan:  Stop heparin Apixaban 10mg  BID x2 mroe days then 5mg  BID   Monitor for signs/symptoms of bleeding     Benetta Spar, PharmD, BCPS, BCCP Clinical Pharmacist  Please check AMION for all [place] phone numbers After 10:00 PM, call [place]

## 2020-02-12 NOTE — Progress Notes (Signed)
Renal Navigator notes discharge order and confirmed discharge plan for today with Attending/Dr. Erlinda Hong. Navigator asked Renal PA to send orders to outpatient HD clinic[facility] and updated clinic on start date, Friday 02/13/20. Instructions noted in AVS. This has already been discussed as a possibility with patient's wife. Please contact Renal Navigator if questions or concerns arise regarding outpatient HD plan.   Alphonzo Cruise, [place] Renal Navigator [phone]

## 2020-02-12 NOTE — Progress Notes (Addendum)
Progress Note    02/12/2020 7:37 AM 3 Days Post-Op  Subjective:  Improved post-op incisional pain and denies hand pain or numbness   Vitals:   02/11/20 1340 02/11/20 2050  BP: (!) 137/56 (!) 130/55  Pulse: 61 64  Resp: 18 19  Temp: 97.9 F (36.6 C) 97.7 F (36.5 C)  SpO2: 94% 98%    Physical Exam: General appearance: Awake, alert in no apparent distress Cardiac: Heart rate and rhythm are regular Respirations: Nonlabored Incisions: Right AC incision well approximated with mild edema. No bleeding.  Extremities: RUE: Good bruit and thrill in fistula. Ecchymosis of volar aspect of arm; soft to palpation. 2+ radial pulse Right IJ TDC site clean and dry.       CBC    Component Value Date/Time   WBC 9.4 02/12/2020 0149   RBC 3.26 (L) 02/12/2020 0149   HGB 8.7 (L) 02/12/2020 0149   HGB 7.7 (L) 10/20/2019 0924   HCT 27.9 (L) 02/12/2020 0149   HCT 39.9 10/01/2018 1130   PLT 141 (L) 02/12/2020 0149   PLT 146 (L) 10/20/2019 0924   MCV 85.6 02/12/2020 0149   MCH 26.7 02/12/2020 0149   MCHC 31.2 02/12/2020 0149   RDW 22.4 (H) 02/12/2020 0149   LYMPHSABS 0.7 02/01/2020 1618   MONOABS 1.0 02/01/2020 1618   EOSABS 0.1 02/01/2020 1618   BASOSABS 0.0 02/01/2020 1618    BMET    Component Value Date/Time   NA 141 02/12/2020 0149   NA 146 (H) 11/22/2016 1057   K 4.2 02/12/2020 0149   CL 107 02/12/2020 0149   CO2 22 02/12/2020 0149   GLUCOSE 93 02/12/2020 0149   BUN 26 (H) 02/12/2020 0149   BUN 44 (H) 11/22/2016 1057   CREATININE 3.58 (H) 02/12/2020 0149   CREATININE 3.18 (HH) 10/20/2019 0924   CREATININE 1.79 (H) 10/27/2015 0803   CALCIUM 7.6 (L) 02/12/2020 0149   GFRNONAA 16 (L) 02/12/2020 0149   GFRNONAA 17 (L) 10/20/2019 0924   GFRNONAA 36 (L) 10/27/2015 0803   GFRAA 20 (L) 08/12/2019 0959   GFRAA 41 (L) 10/27/2015 0803     Intake/Output Summary (Last 24 hours) at 02/12/2020 0737 Last data filed at 02/12/2020 0659 Gross per 24 hour  Intake 261.02 ml  Output  --  Net 261.02 ml    HOSPITAL MEDICATIONS Scheduled Meds: . sodium chloride   Intravenous Once  . amLODipine  2.5 mg Oral Daily  . atorvastatin  20 mg Oral Daily  . Chlorhexidine Gluconate Cloth  6 each Topical Q0600  . Chlorhexidine Gluconate Cloth  6 each Topical Q0600  . feeding supplement  237 mL Oral TID BM  . insulin aspart  0-6 Units Subcutaneous TID WC  . metoprolol succinate  25 mg Oral Daily  . multivitamin  1 tablet Oral QHS  . mupirocin ointment  1 application Nasal BID  . pantoprazole  40 mg Oral BID  . sodium chloride flush  10-40 mL Intracatheter Q12H  . tamsulosin  0.4 mg Oral Daily   Continuous Infusions: . sodium chloride    . sodium chloride    . sodium chloride 10 mL/hr at 02/09/20 1741  . ferric gluconate (FERRLECIT/NULECIT) IV 125 mg (02/11/20 1115)  . heparin 1,050 Units/hr (02/12/20 0659)   PRN Meds:.sodium chloride, sodium chloride, acetaminophen **OR** acetaminophen, alteplase, heparin, lidocaine (PF), lidocaine-prilocaine, ondansetron **OR** ondansetron (ZOFRAN) IV, oxyCODONE, pentafluoroprop-tetrafluoroeth, polyethylene glycol, promethazine, sodium chloride flush  Assessment and Plan: POD 3 right 1st stage BVT.  VSS. Afebrile.  Heparin restarted yesterday. No evidence of expanding RUE hematoma or oozing. ACE wrap replaced. Covid treatment completed; isolation discontinued.  -DVT prophylaxis:  On heparin infusion   Risa Grill, PA-C [facility] [phone] 02/12/2020  7:37 AM   I have seen and evaluated the patient. I agree with the PA note as documented above. Right arm hematoma looks better today, good thrill in basilic vein fistula.  Re-wrapped arm with gentle ace.  [facility] working well.  Will arrange follow-up in 4 weeks in office for right arm fistula duplex and would need 2nd stage in future if fistula matures well.  Marty Heck, MD [facility] [place] Office: [phone]

## 2020-02-12 NOTE — Discharge Summary (Signed)
Discharge Summary  Paul Price DJT:[id number] DOB: 1940/10/29  PCP: [name], MD  Admit date: 02/01/2020 Discharge date: 02/12/2020  Time spent: 40mns, more than 50% time spent on coordination of care.   Recommendations for Outpatient Follow-up:  1. F/u with PCP  for hospital discharge follow up 2. Follow-up with nephrology, start outpatient dialysis Monday Wednesday Friday 3. Follow-up with cardiology Dr. KClaiborne Billingson February 10 4. Follow-up with vascular surgery Dr. CCarlis Abbottin 4 weeks 5. Home health arranged   Discharge Diagnoses:  Active Hospital Problems   Diagnosis Date Noted  . AKI (acute kidney injury) ([facility] 12/02/2013  . Intractable nausea and vomiting 02/01/2020  . Hypernatremia 02/01/2020  . Elevated troponin 02/01/2020  . COVID-19 virus infection 01/21/2020  . PAF (paroxysmal atrial fibrillation) ([facility] 01/04/2013  . HTN (hypertension) 08/20/2012  . Hyperlipidemia with target LDL less than 70 08/20/2012  . OSA on CPAP 08/20/2012    Resolved Hospital Problems  No resolved problems to display.    Discharge Condition: stable  Diet recommendation: Renal diet  Filed Weights   02/02/20 1952 02/10/20 0716 02/10/20 1020  Weight: 80.2 kg (P) 80.8 kg 80.3 kg    History of present illness: (Per admitting MD Dr ZClearence Ped RDemtrius Rounds is a 80y.o. male, with history of paroxysmal atrial fibrillation, chronic kidney disease, hypertension, hyperlipidemia, BPH and more presents to the ED with a complaint of shortness of breath and nausea.  Patient was just discharged 01/26/2020 after an admission for upper GI bleeding, AKI, COVID-pneumonia.  Patient finished a course of remdesivir and steroids.  He did not have an oxygen requirement at that time but he did have infiltrates on repeat chest x-ray.  Patient was seen by GI they advised holding Xarelto until 01/31/2020.  Patient did not resume Xarelto at home.  Patient was discharged with a creatinine level of 3.71.  Today he  reports that the "felt like the world was closing in" on him.  Patient reports that he has been throwing up every day whenever he tries to take in any liquid.  He has not tried to eat any solid foods.  He has been able to walk around with his walker, he does not feel dizzy, he does feel fatigued and weak.  Patient reports that when he throws up he sees mucus, sometimes with blood.  Patient reports when he eats solid food he has diarrhea, but he has not been eating any solid foods and this has not been a problem recently.  Patient's last normal meal was 2 weeks ago, last normal bowel movement was 2 weeks ago.  Patient has noted decreased urine output which is consistent with virtually no p.o. intake.  Patient does have blood on his lips in the ED, he reports this is from biting his lips.  Patient has no other complaints at this time.  In the ED Temp 98, heart rate 61, respiratory rate 14-20, blood pressure 151/58, satting at 95-100% on room air White blood cell, 16.8, hemoglobin 9.2 Chemistry panel reveals a BUN of 66, creatinine of 4.55 Bicarb is normal at 24 Slight hyperglycemia at 145 Albumin is low at 2.4 Initial elevated troponin of 90 EKG shows a heart rate of 64, ventricular paced Chest x-ray shows improved aeration on left side, cannot exclude residual opacities representing scarring versus pneumonia D5 started at 50 mL/h Urine culture pending UA pending Admission requested for AKI   Hospital Course:  Principal Problem:   AKI (acute kidney injury) ([facility] Active Problems:  HTN (hypertension)   Hyperlipidemia with target LDL less than 70   OSA on CPAP   PAF (paroxysmal atrial fibrillation) (HCC)   COVID-19 virus infection   Intractable nausea and vomiting   Hypernatremia   Elevated troponin   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 -Patient started  onHD1/28. Second HD 02/10/2020.  Symptom improved with dialysis -Underwent first stage Basilic Vein fistula placement and conversion toHD tunneled catheter on 1-31 by Dr Carlis Abbott. -Patient is now set up for Monday Wednesday Friday at [facility] outpatient dialysis    Staph Aureus UTI  Completed cefazolin 6 days.  TEE negative for endocarditis.  Appreciate ID recommendations.  Blood culture obtain 1/26: No growth to date,.   Anemia; iron deficiency;  Received IV iron this admission. Received one unit PRBC 02/09/2020 per op.  Hb 8.0 Continue oral iron supplement  A. Fib; second-degree AV block s/p pacemaker, Left Atrium Appendage Thrombus:  -Per GI from last admission ok to resume Xarelto 1/22. -TEE showed, Left Atrium appendage thrombus. -Evaluated by cardiologist who recommend transition to eliquis.  -anticoagulation held briefly due to post op due to hematoma at fistula site, vascular surgery okay to resume heparin without bolus on 2/2,, no active bleeding on heparin drip, vascular surgery oked to transition to eliquis on 2/3  Hypoalbuminemia;  -started ensure.  -Nutritionist consulted.  Chronic HFrEF;  Echo 2021 ejection fraction 40 to 45% Monitor volume status is started on dialysis  CAD s/p CABG ,  PCI to LAD in 2012,   Elevated troponin: Most likely demand ischemia and worsening renal function. Denies chest pain. Cardiology consulted  Nausea and vomiting; KUB negative for bowel obstruction Resolved.  Hypertension: Continue with Norvasc and Metoprolol.  Hypernatremia: Sodium peaked at 150 Resolved With IV fluids.   Hypokalemia: Resolved.  Constipation;received sorbitol.    OSA, he does not like hospital CPAP ,want home CPAP   FTT/Generalized weakness:PT eval. Home health arranged      Covid positive, tested 01/20/2020 Completed a course of rhythm severe and IV steroid Per Dr Baxter Flattery, ok to discontinue isolation.    DVT  prophylaxis: SCDs Start: 02/01/20 1943, heparin drip, eliquis  Code Status: full Family Communication: wife at bedside on 2/2 Disposition:  Home with home health     Consultants:   Nephrology  Vascular surgery  Cardiology  ID  Procedures:   Dialysis Stage 1 Basilic Vein fistula placement  Antimicrobials:     Anti-infectives (From admission, onward)   Start     Dose/Rate Route Frequency Ordered Stop   02/09/20 0000  ceFAZolin (ANCEF) IVPB 1 g/50 mL premix  Status:  Discontinued       Note to Pharmacy: Send with pt to OR   1 g 100 mL/hr over 30 Minutes Intravenous On call 02/08/20 0925 02/08/20 1320   02/09/20 0000  ceFAZolin (ANCEF) IVPB 2g/100 mL premix       Note to Pharmacy: Send with pt to OR   2 g 200 mL/hr over 30 Minutes Intravenous On call 02/08/20 1320 02/09/20 1332   02/05/20 1000  ceFAZolin (ANCEF) IVPB 2g/100 mL premix        2 g 200 mL/hr over 30 Minutes Intravenous Every 12 hours 02/05/20 0810 02/07/20 2133   02/02/20 1000  cefTRIAXone (ROCEPHIN) 2 g in sodium chloride 0.9 % 100 mL IVPB  Status:  Discontinued  2 g 200 mL/hr over 30 Minutes Intravenous Every 24 hours 02/02/20 0921 02/05/20 0809      Discharge Exam: BP (!) 130/55 (BP Location: Left Arm)   Pulse 64   Temp 97.7 F (36.5 C) (Oral)   Resp 19   Ht 5' 6"  (1.676 m)   Wt 80.3 kg   SpO2 98%   BMI 28.57 kg/m   General: * Cardiovascular: * Respiratory: *  Discharge Instructions You were cared for by a hospitalist during your hospital stay. If you have any questions about your discharge medications or the care you received while you were in the hospital after you are discharged, you can call the unit and asked to speak with the hospitalist on call if the hospitalist that took care of you is not available. Once you are discharged, your primary care physician will handle any further medical issues. Please note that NO REFILLS for any discharge medications will be  authorized once you are discharged, as it is imperative that you return to your primary care physician (or establish a relationship with a primary care physician if you do not have one) for your aftercare needs so that they can reassess your need for medications and monitor your lab values.  Discharge Instructions    Diet - low sodium heart healthy   Complete by: As directed    Renal diet   Discharge wound care:   Complete by: As directed    Per vascular surgery   Increase activity slowly   Complete by: As directed      Allergies as of 02/12/2020      Reactions   Simvastatin Other (See Comments)   Abdominal cramps   Nitrostat [nitroglycerin] Other (See Comments)   Causes blood pressure to "bottom out"      Medication List    STOP taking these medications   furosemide 40 MG tablet Commonly known as: LASIX   Rivaroxaban 15 MG Tabs tablet Commonly known as: XARELTO   sodium bicarbonate 650 MG tablet     TAKE these medications   acetaminophen 500 MG tablet Commonly known as: TYLENOL Take 500-1,000 mg by mouth every 6 (six) hours as needed for mild pain, moderate pain or headache.   allopurinol 300 MG tablet Commonly known as: ZYLOPRIM Take 300 mg by mouth daily.   amLODipine 5 MG tablet Commonly known as: NORVASC Take 2.5 mg by mouth daily.   apixaban 5 MG Tabs tablet Commonly known as: ELIQUIS Take 37m (2 tablets) by mouth twice daily on 02/12/20 and 2/4.  Then on 2/5, decrease to 518m(1 tablet) by mouth twice daily   atorvastatin 20 MG tablet Commonly known as: LIPITOR TAKE 1 TABLET BY MOUTH DAILY   b complex vitamins tablet Take 1 tablet by mouth daily.   docusate sodium 100 MG capsule Commonly known as: COLACE Take 2 capsules (200 mg total) by mouth daily.   ferrous sulfate 325 (65 FE) MG tablet Take 1 tablet (325 mg total) by mouth daily with breakfast. What changed: when to take this   metoprolol succinate 25 MG 24 hr tablet Commonly known as:  TOPROL-XL TAKE 1 TABLET BY MOUTH DAILY   pantoprazole 40 MG tablet Commonly known as: PROTONIX Take 1 tablet (40 mg total) by mouth 2 (two) times daily.   tamsulosin 0.4 MG Caps capsule Commonly known as: Flomax Take 1 capsule (0.4 mg total) by mouth daily.            Discharge Care Instructions  (  From admission, onward)         Start     Ordered   02/12/20 0000  Discharge wound care:       Comments: Per vascular surgery   02/12/20 1035         Allergies  Allergen Reactions  . Simvastatin Other (See Comments)    Abdominal cramps  . Nitrostat [Nitroglycerin] Other (See Comments)    Causes blood pressure to "bottom out"    Follow-up Information    [facility] Follow up.   Specialty: [place] Why: Continue home health services with [facility] Contact information: [place] [address] [phone]        Troy Sine, MD. Go on 02/19/2020.   Specialty: Cardiology Why: @8 :20 for follow up  Contact information: [address] [place] [address] [phone]        Marty Heck, MD Follow up.   Specialty: Vascular Surgery Why: Our office will call you with follow-up appointment Contact information: [place] [address] [phone]        [name], MD Follow up.   Specialty: Internal Medicine Why: Hospital discharge follow-up Contact information: [address] [place] [address] [phone]                The results of significant diagnostics from this hospitalization (including imaging, microbiology, ancillary and laboratory) are listed below for reference.    Significant Diagnostic Studies: DG Abd 1 View  Result Date: 02/03/2020 CLINICAL DATA:  Vomiting EXAM: ABDOMEN - 1 VIEW COMPARISON:  02/26/2019 FINDINGS: The bowel gas pattern is normal. Multiple calcified gallstones. Degenerative changes of the bilateral hips. IMPRESSION:  1. Nonobstructive bowel gas pattern. 2. Cholelithiasis. Electronically Signed   By: Davina Poke D.O.   On: 02/03/2020 12:33   US RENAL  Result Date: 01/24/2020 CLINICAL DATA:  Acute kidney injury. Status post right-sided nephrectomy. EXAM: RENAL / URINARY TRACT ULTRASOUND COMPLETE COMPARISON:  CT dated 02/26/2019 FINDINGS: Right Kidney: Surgically absent Left Kidney: Renal measurements: 12.6 x 5.7 x 3.9 cm = volume: 148 mL. There is no hydronephrosis. The left kidney is echogenic. Small cysts are noted arising from the lower pole. Arising from the lower pole the left kidney is an indeterminate exophytic 2 x 1.4 cm nodule. This is not clearly cystic on this study. Additionally, this lesion appears to have grown since the prior CT. Bladder: Appears normal for degree of bladder distention. Other: None. IMPRESSION: 1. Status post right-sided nephrectomy. 2. No left-sided hydronephrosis. 3. Echogenic left kidney which can be seen in patients with medical renal disease. 4. Indeterminate, solid-appearing nodule rising from the lower pole the left kidney is concerning for renal cell carcinoma. This nodule appears to have increased in size since the patient's prior study given differences in modality. Electronically Signed   By: Constance Holster M.D.   On: 01/24/2020 04:02   IR Fluoro Guide CV Line Right  Result Date: 02/05/2020 INDICATION: 80 year old male referred for temporary hemodialysis catheter EXAM: IMAGE GUIDED TEMPORARY HEMODIALYSIS CATHETER MEDICATIONS: None. ANESTHESIA/SEDATION: None FLUOROSCOPY TIME:  Fluoroscopy Time: 0 minutes 6 seconds (1 mGy). COMPLICATIONS: None PROCEDURE: Informed written consent was obtained from the patient's family after a discussion of the risks, benefits, and alternatives to treatment. Questions regarding the procedure were encouraged and answered. The right neck was prepped with chlorhexidine in a sterile fashion, and a sterile drape was applied covering the operative  field. Maximum barrier sterile technique  with sterile gowns and gloves were used for the procedure. A timeout was performed prior to the initiation of the procedure. A micropuncture kit was utilized to access the right internal jugular vein under direct, real-time ultrasound guidance after the overlying soft tissues were anesthetized with 1% lidocaine with epinephrine. Ultrasound image documentation was performed. The microwire was kinked to measure appropriate catheter length. A stiff glidewire was advanced to the level of the IVC. A 19 cm hemodialysis catheter was then placed over the wire. Final catheter positioning was confirmed and documented with a spot radiographic image. The catheter aspirates and flushes normally. The catheter was flushed with appropriate volume heparin dwells. Dressings were applied. The patient tolerated the procedure well without immediate post procedural complication. . IMPRESSION: Status post image guided right IJ temporary hemodialysis catheter. Catheter ready for use. Signed, Dulcy Fanny. Dellia Nims, RPVI [facility] Radiology Electronically Signed   By: Corrie Mckusick D.O.   On: 02/05/2020 12:43   IR US Guide Vasc Access Right  Result Date: 02/05/2020 INDICATION: 80 year old male referred for temporary hemodialysis catheter EXAM: IMAGE GUIDED TEMPORARY HEMODIALYSIS CATHETER MEDICATIONS: None. ANESTHESIA/SEDATION: None FLUOROSCOPY TIME:  Fluoroscopy Time: 0 minutes 6 seconds (1 mGy). COMPLICATIONS: None PROCEDURE: Informed written consent was obtained from the patient's family after a discussion of the risks, benefits, and alternatives to treatment. Questions regarding the procedure were encouraged and answered. The right neck was prepped with chlorhexidine in a sterile fashion, and a sterile drape was applied covering the operative field. Maximum barrier sterile technique with sterile gowns and gloves were used for the procedure. A  timeout was performed prior to the initiation of the procedure. A micropuncture kit was utilized to access the right internal jugular vein under direct, real-time ultrasound guidance after the overlying soft tissues were anesthetized with 1% lidocaine with epinephrine. Ultrasound image documentation was performed. The microwire was kinked to measure appropriate catheter length. A stiff glidewire was advanced to the level of the IVC. A 19 cm hemodialysis catheter was then placed over the wire. Final catheter positioning was confirmed and documented with a spot radiographic image. The catheter aspirates and flushes normally. The catheter was flushed with appropriate volume heparin dwells. Dressings were applied. The patient tolerated the procedure well without immediate post procedural complication. . IMPRESSION: Status post image guided right IJ temporary hemodialysis catheter. Catheter ready for use. Signed, Dulcy Fanny. Dellia Nims, RPVI [facility] Radiology Electronically Signed   By: Corrie Mckusick D.O.   On: 02/05/2020 12:43   DG Chest Port 1 View  Result Date: 02/09/2020 CLINICAL DATA:  Right-sided dialysis catheter EXAM: PORTABLE CHEST 1 VIEW COMPARISON:  02/01/2020 FINDINGS: Cardiomegaly with mild pulmonary vascular congestion. Mild patchy opacity in the lingula and left lower lobe, likely atelectasis versus mild perihilar edema. Suspected small bilateral pleural effusions. No pneumothorax. 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 Right chest dual lumen dialysis catheter terminating in the upper right atrium. Median sternotomy. IMPRESSION: Cardiomegaly with mild pulmonary vascular congestion and small bilateral pleural effusions. Mild patchy opacity in the lingula and left lower lobe, likely atelectasis versus mild perihilar edema. Right chest dual lumen dialysis catheter terminating in the upper right atrium.  Electronically Signed   By: Julian Hy M.D.   On: 02/09/2020 16:15   DG Chest Port 1 View  Result Date: 02/01/2020 CLINICAL DATA:  Recently discharged with COVID-19.  Short of breath. EXAM: PORTABLE CHEST 1 VIEW COMPARISON:  01/23/2020 FINDINGS: Dual lead pacer. Midline trachea. Moderate cardiomegaly. No pleural effusion or pneumothorax. Mild interstitial thickening, likely related to remote smoking history. Improved left-sided aeration, without residual well-defined lobar consolidation. IMPRESSION: Improved left-sided aeration. Cannot exclude mild residual interstitial opacities which could represent scarring or pneumonia. Cardiomegaly without congestive failure. Electronically Signed   By: Abigail Miyamoto M.D.   On: 02/01/2020 17:01   DG Chest Port 1 View  Result Date: 01/23/2020 CLINICAL DATA:  Shortness of breath, COVID EXAM: PORTABLE CHEST 1 VIEW COMPARISON:  Radiograph 01/20/2020 FINDINGS: Pacer pack overlies left chest wall with leads in stable position directed towards the apex and right atrium. Stable cardiomediastinal contours with cardiomegaly and a calcified aorta. Postsurgical changes related to prior CABG including intact and aligned sternotomy wires and multiple surgical clips projecting over the mediastinum. Telemetry leads overlie the chest. Increasing mixed patchy airspace and interstitial opacity throughout the lungs most coalescent in the left mid to lower lung periphery and right lung base. No pneumothorax. Suspect at least small left pleural effusion. Pulmonary vascularity is redistributed and indistinct. No acute osseous or soft tissue abnormality. Degenerative changes are present in the imaged spine and shoulders. IMPRESSION: 1. Increasing mixed patchy airspace and interstitial opacity throughout the lungs most coalescent in the left mid to lower lung periphery and right lung base, concerning for a developing infection, edema or likely some combination there of. Electronically  Signed   By: Lovena Le M.D.   On: 01/23/2020 07:02   DG Chest Portable 1 View  Result Date: 01/20/2020 CLINICAL DATA:  Fever cough EXAM: PORTABLE CHEST 1 VIEW COMPARISON:  04/03/2019 FINDINGS: Post sternotomy changes. Left-sided pacing device as before. Cardiomegaly. Trace right-sided pleural effusion. No focal consolidation or pneumothorax. IMPRESSION: Cardiomegaly with trace right-sided pleural effusion. Electronically Signed   By: Donavan Foil M.D.   On: 01/20/2020 19:50   ECHO TEE  Result Date: 02/06/2020    TRANSESOPHOGEAL ECHO REPORT   Patient Name:   Paul Price Date of Exam: 02/06/2020 Medical Rec #:  [id number]      Height:       66.0 in Accession #:    [id number]     Weight:       176.8 lb Date of Birth:  1940/10/30       BSA:          1.898 m Patient Age:    10 years       BP:           131/44 mmHg Patient Gender: M              HR:           62 bpm. Exam Location:  Inpatient Procedure: Transesophageal Echo, Cardiac Doppler and Color Doppler Indications:     Bacteremia  History:         Patient has prior history of Echocardiogram examinations, most                  recent 01/08/2020. Arrythmias:Atrial Fibrillation,                  Signs/Symptoms:Bacteremia; Risk Factors:Hypertension and                  Dyslipidemia. Covid+, Staph Aureus.  Sonographer:     Dustin Flock Referring Phys:  [id number] Abigail Butts Diagnosing Phys: Skeet Latch MD PROCEDURE: The transesophogeal probe was passed without difficulty through the esophogus of the patient. Sedation performed by different physician. The  patient was monitored while under deep sedation. Anesthestetic sedation was provided intravenously by Anesthesiology: 72.110m of Propofol. Image quality was good. The patient's vital signs; including heart rate, blood pressure, and oxygen saturation; remained stable throughout the procedure. The patient developed no complications during the procedure. IMPRESSIONS  1. Global hypokinesis worse in  the septum. Left ventricular ejection fraction, by estimation, is 40 to 45%. The left ventricle has mildly decreased function. The left ventricle demonstrates global hypokinesis. Left ventricular diastolic function could not be evaluated.  2. Right ventricular systolic function is mildly reduced. The right ventricular size is normal.  3. Thrombus in the left atrial appendage measuring 1.5 x 0.6 cm. Left atrial size was mildly dilated. A left atrial/left atrial appendage thrombus was detected. The LAA emptying velocity was 47 cm/s.  4. Pacemaker wire.  5. The mitral valve is normal in structure. Mild mitral valve regurgitation. No evidence of mitral stenosis.  6. Tricuspid valve regurgitation is mild to moderate.  7. The aortic valve is tricuspid. Aortic valve regurgitation is not visualized. No aortic stenosis is present.  8. There is mild (Grade II) atheroma plaque involving the descending aorta.  9. The inferior vena cava is normal in size with greater than 50% respiratory variability, suggesting right atrial pressure of 3 mmHg. 10. Evidence of atrial level shunting detected by color flow Doppler. There is a small patent foramen ovale with predominantly left to right shunting across the atrial septum. Conclusion(s)/Recommendation(s): Normal biventricular function without evidence of hemodynamically significant valvular heart disease. FINDINGS  Left Ventricle: Global hypokinesis worse in the septum. Left ventricular ejection fraction, by estimation, is 40 to 45%. The left ventricle has mildly decreased function. The left ventricle demonstrates global hypokinesis. The left ventricular internal cavity size was normal in size. There is no left ventricular hypertrophy. Left ventricular diastolic function could not be evaluated. Right Ventricle: The right ventricular size is normal. No increase in right ventricular wall thickness. Right ventricular systolic function is mildly reduced. Left Atrium: Thrombus in the left  atrial appendage measuring 1.5 x 0.6 cm. Left atrial size was mildly dilated. A left atrial/left atrial appendage thrombus was detected. The LAA emptying velocity was 47 cm/s. Right Atrium: Pacemaker wire. Right atrial size was normal in size. Pericardium: There is no evidence of pericardial effusion. Mitral Valve: The mitral valve is normal in structure. Mild mitral valve regurgitation. No evidence of mitral valve stenosis. Tricuspid Valve: The tricuspid valve is normal in structure. Tricuspid valve regurgitation is mild to moderate. No evidence of tricuspid stenosis. Aortic Valve: The aortic valve is tricuspid. Aortic valve regurgitation is not visualized. No aortic stenosis is present. Pulmonic Valve: The pulmonic valve was normal in structure. Pulmonic valve regurgitation is trivial. No evidence of pulmonic stenosis. Aorta: The aortic root is normal in size and structure. There is mild (Grade II) atheroma plaque involving the descending aorta. Venous: The inferior vena cava is normal in size with greater than 50% respiratory variability, suggesting right atrial pressure of 3 mmHg. IAS/Shunts: Evidence of atrial level shunting detected by color flow Doppler. A small patent foramen ovale is detected with predominantly left to right shunting across the atrial septum. Additional Comments: A pacer wire is visualized.  TRICUSPID VALVE TR Peak grad:   47.6 mmHg TR Vmax:        345.00 cm/s TSkeet LatchMD Electronically signed by TSkeet LatchMD Signature Date/Time: 02/06/2020/2:13:57 PM    Final    CUP PACEART REMOTE DEVICE CHECK  Result Date: 01/23/2020 Scheduled  remote reviewed. Normal device function.  1 NSVT lasting 7 beats w/ rate 170's bpm Next remote 91 days. HB  HYBRID OR IMAGING ([facility] ONLY)  Result Date: 02/09/2020 There is no interpretation for this exam.  This order is for images obtained during a surgical procedure.  Please See "Surgeries" Tab for more information regarding the procedure.     Microbiology: Recent Results (from the past 240 hour(s))  Culture, blood (Routine X 2) w Reflex to ID Panel     Status: None   Collection Time: 02/04/20  4:40 PM   Specimen: BLOOD  Result Value Ref Range Status   Specimen Description BLOOD RIGHT ANTECUBITAL  Final   Special Requests   Final    BOTTLES DRAWN AEROBIC ONLY Blood Culture adequate volume   Culture   Final    NO GROWTH 5 DAYS Performed at [facility], [address], [place], [place] [address]    Report Status 02/09/2020 FINAL  Final  Culture, blood (Routine X 2) w Reflex to ID Panel     Status: None   Collection Time: 02/04/20  4:46 PM   Specimen: BLOOD  Result Value Ref Range Status   Specimen Description BLOOD RIGHT ANTECUBITAL  Final   Special Requests   Final    BOTTLES DRAWN AEROBIC ONLY Blood Culture adequate volume   Culture   Final    NO GROWTH 5 DAYS Performed at [facility], [place] [address] [place], [place] [address]    Report Status 02/09/2020 FINAL  Final  Surgical PCR screen     Status: Abnormal   Collection Time: 02/09/20  7:23 AM   Specimen: Nasal Mucosa; Nasal Swab  Result Value Ref Range Status   MRSA, PCR NEGATIVE NEGATIVE Final   Staphylococcus aureus POSITIVE (A) NEGATIVE Final    Comment: (NOTE) The Xpert SA Assay (FDA approved for NASAL specimens in patients 85 years of age and older), is one component of a comprehensive surveillance program. It is not intended to diagnose infection nor to guide or monitor treatment. Performed at [facility], [place] [address], [place], [place] [address]      Labs: Basic Metabolic Panel: Recent Labs  Lab 02/06/20 0146 02/07/20 0322 02/08/20 0151 02/09/20 0420 02/10/20 0848 02/11/20 0231 02/12/20 0149  NA 143   < > 141 143 141 141 141  K 4.0   < > 3.7 3.9 4.1 4.0 4.2  CL 113*   < > 107 108 106 105 107  CO2 18*   < > 25 24 25 25 22   GLUCOSE 109*   < > 94 107* 88 84 93  BUN 49*   < > 26* 28* 19 19 26*  CREATININE 4.25*    < > 3.53* 3.82* 2.63* 3.23* 3.58*  CALCIUM 7.7*   < > 7.7* 8.0* 7.7* 7.7* 7.6*  PHOS 2.9  --   --   --  2.6  --  3.2   < > = values in this interval not displayed.   Liver Function Tests: Recent Labs  Lab 02/06/20 0146 02/10/20 0848 02/12/20 0149  ALBUMIN 1.7* 1.7* 1.8*   No results for input(s): LIPASE, AMYLASE in the last 168 hours. No results for input(s): AMMONIA in the last 168 hours. CBC: Recent Labs  Lab 02/09/20 0420 02/10/20 0850 02/10/20 2114 02/11/20 0231 02/12/20 0149  WBC 11.1* 9.9 9.5 9.2 9.4  HGB 7.2* 8.1* 8.4* 8.5* 8.7*  HCT 23.7* 28.1* 28.7* 28.5* 27.9*  MCV  84.6 87.8 87.8 86.9 85.6  PLT 163 148* 148* 145* 141*   Cardiac Enzymes: No results for input(s): CKTOTAL, CKMB, CKMBINDEX, TROPONINI in the last 168 hours. BNP: BNP (last 3 results) Recent Labs    01/21/20 0900 01/26/20 0350  BNP 881.8* 613.2*    ProBNP (last 3 results) No results for input(s): PROBNP in the last 8760 hours.  CBG: Recent Labs  Lab 02/11/20 0753 02/11/20 1149 02/11/20 1719 02/11/20 2124 02/12/20 0907  GLUCAP 85 85 97 111* 128*       Signed:  Florencia Reasons MD, PhD, FACP  [facility] 02/12/2020, 11:07 AM

## 2020-02-12 NOTE — Progress Notes (Addendum)
[facility] Progress Note   Paul Price is an 80 y.o. 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 Patient is now status post AVF and ESRD on HD.  Assessment/ Plan:   1. ESRD -previously with AKI on CKD stage V.  HD initiated for uremia with nausea present with a crt in the 4's.  Patient has had session on 1/29.  Had HD #2 yesterday, next HD planned for 2/3.  531mL urine output per I's/O's.  Patient is now set up for Monday Wednesday Friday at [facility] outpatient dialysis.  Can discharge from nephrology standpoint.  We will plan for outpatient HD tomorrow 2/4 if discharged today.  Orders being sent to OP unit   2. Staph UTI/concern for bacteremia: S/p cefazolin. Blood cultures no growth 5 days.  3.   Nausea/emesis/anorexia: Improved after HD.  Likely secondary to uremia.  No complaints today.  4. Anemia: Hgb 8.5 2/2 > 8.7 today. On Nulecit now. S/p one dose feraheme 1/28.  given ESA as well  - Continue to monitor  5. A fib: Cardiac thrombus per cardiology after Echo.  Was on heparin GTT but will transition to Eliquis per primary team for likely discharge today.  6. Renal mass noted on ultrasound last admission. Plans for outpatient urology follow up.  7. PTH mildly elevated at 66. Phos 3.2 on 2/3.  No meds yet   Patient seen and examined, agree with above note with above modifications. 80 year old with advanced CKD- having uremic sxms even though numbers not bad.  Has improved with HD-  Plan is to continue.  He is going home today with plans to start OP HD at [place], [place] 02/12/2020     Subjective:   States he is anxious to go home but understands that it may be another day or two. No other complaints this morning.   Now see discharge summary in chart  Objective:   BP (!) 130/55 (BP Location: Left Arm)   Pulse 64    Temp 97.7 F (36.5 C) (Oral)   Resp 19   Ht 5\' 6"  (1.676 m)   Wt 80.3 kg   SpO2 98%   BMI 28.57 kg/m   Intake/Output Summary (Last 24 hours) at 02/12/2020 6734 Last data filed at 02/12/2020 1937 Gross per 24 hour  Intake 261.02 ml  Output -  Net 261.02 ml   Weight change:   Physical Exam: General: Alert and oriented in no apparent distress Heart: Tachycardic, irregular rhythm, no edema in lower extremities.  Lungs: CTA bilaterally Skin: Warm and dry Extremities: Palpable thrill and appropriate bruit present in Rt upper extremity s/p AVF  Imaging: No results found.  Labs: BMET Recent Labs  Lab 02/06/20 0146 02/07/20 0322 02/08/20 0151 02/09/20 0420 02/10/20 0848 02/11/20 0231 02/12/20 0149  NA 143 145 141 143 141 141 141  K 4.0 3.6 3.7 3.9 4.1 4.0 4.2  CL 113* 115* 107 108 106 105 107  CO2 18* 21* 25 24 25 25 22   GLUCOSE 109* 97 94 107* 88 84 93  BUN 49* 45* 26* 28* 19 19 26*  CREATININE 4.25* 4.47* 3.53* 3.82* 2.63* 3.23* 3.58*  CALCIUM 7.7* 7.8* 7.7* 8.0* 7.7* 7.7* 7.6*  PHOS 2.9  --   --   --  2.6  --  3.2   CBC Recent Labs  Lab 02/10/20 0850 02/10/20 2114 02/11/20 0231 02/12/20 0149  WBC 9.9 9.5 9.2 9.4  HGB 8.1* 8.4* 8.5* 8.7*  HCT 28.1* 28.7* 28.5* 27.9*  MCV 87.8 87.8 86.9 85.6  PLT 148* 148* 145* 141*    Medications:    . sodium chloride   Intravenous Once  . amLODipine  2.5 mg Oral Daily  . atorvastatin  20 mg Oral Daily  . Chlorhexidine Gluconate Cloth  6 each Topical Q0600  . Chlorhexidine Gluconate Cloth  6 each Topical Q0600  . feeding supplement  237 mL Oral TID BM  . insulin aspart  0-6 Units Subcutaneous TID WC  . metoprolol succinate  25 mg Oral Daily  . multivitamin  1 tablet Oral QHS  . mupirocin ointment  1 application Nasal BID  . pantoprazole  40 mg Oral BID  . sodium chloride flush  10-40 mL Intracatheter Q12H  . tamsulosin  0.4 mg Oral Daily     Lurline Del, DO [facility] Family Medicine Resident, PGY-2 02/12/2020, 7:07 AM

## 2020-02-13 DIAGNOSIS — D689 Coagulation defect, unspecified: Secondary | ICD-10-CM | POA: Diagnosis not present

## 2020-02-13 DIAGNOSIS — N186 End stage renal disease: Secondary | ICD-10-CM | POA: Diagnosis not present

## 2020-02-13 DIAGNOSIS — D509 Iron deficiency anemia, unspecified: Secondary | ICD-10-CM | POA: Diagnosis not present

## 2020-02-13 DIAGNOSIS — D631 Anemia in chronic kidney disease: Secondary | ICD-10-CM | POA: Diagnosis not present

## 2020-02-13 DIAGNOSIS — Z992 Dependence on renal dialysis: Secondary | ICD-10-CM | POA: Diagnosis not present

## 2020-02-13 DIAGNOSIS — N2581 Secondary hyperparathyroidism of renal origin: Secondary | ICD-10-CM | POA: Diagnosis not present

## 2020-02-13 DIAGNOSIS — T8249XD Other complication of vascular dialysis catheter, subsequent encounter: Secondary | ICD-10-CM | POA: Diagnosis not present

## 2020-02-14 ENCOUNTER — Telehealth: Payer: Self-pay | Admitting: Nephrology

## 2020-02-14 DIAGNOSIS — I1311 Hypertensive heart and chronic kidney disease without heart failure, with stage 5 chronic kidney disease, or end stage renal disease: Secondary | ICD-10-CM | POA: Diagnosis not present

## 2020-02-14 DIAGNOSIS — K922 Gastrointestinal hemorrhage, unspecified: Secondary | ICD-10-CM | POA: Diagnosis not present

## 2020-02-14 DIAGNOSIS — U071 COVID-19: Secondary | ICD-10-CM | POA: Diagnosis not present

## 2020-02-14 DIAGNOSIS — R2689 Other abnormalities of gait and mobility: Secondary | ICD-10-CM | POA: Diagnosis not present

## 2020-02-14 DIAGNOSIS — D62 Acute posthemorrhagic anemia: Secondary | ICD-10-CM | POA: Diagnosis not present

## 2020-02-14 DIAGNOSIS — J1282 Pneumonia due to coronavirus disease 2019: Secondary | ICD-10-CM | POA: Diagnosis not present

## 2020-02-14 DIAGNOSIS — N185 Chronic kidney disease, stage 5: Secondary | ICD-10-CM | POA: Diagnosis not present

## 2020-02-14 DIAGNOSIS — I480 Paroxysmal atrial fibrillation: Secondary | ICD-10-CM | POA: Diagnosis not present

## 2020-02-14 DIAGNOSIS — J449 Chronic obstructive pulmonary disease, unspecified: Secondary | ICD-10-CM | POA: Diagnosis not present

## 2020-02-14 DIAGNOSIS — I5022 Chronic systolic (congestive) heart failure: Secondary | ICD-10-CM | POA: Diagnosis not present

## 2020-02-14 NOTE — Telephone Encounter (Signed)
Transition of Care Contact from [place]  Date of Discharge: 02/12/20 Date of Contact: 02/14/20 Method of contact: phone - attempted  Attempted to contact patient to discuss transition of care from inpatient admission.  Patient did not answer the phone.  Message was left on patient's voicemail informing them we would attempt to call them again and if unable to reach will follow up at dialysis.  Jen Mow, PA-C [facility] Pager: [phone]

## 2020-02-16 ENCOUNTER — Other Ambulatory Visit: Payer: Self-pay | Admitting: *Deleted

## 2020-02-16 DIAGNOSIS — D509 Iron deficiency anemia, unspecified: Secondary | ICD-10-CM | POA: Diagnosis not present

## 2020-02-16 DIAGNOSIS — N2581 Secondary hyperparathyroidism of renal origin: Secondary | ICD-10-CM | POA: Diagnosis not present

## 2020-02-16 DIAGNOSIS — I236 Thrombosis of atrium, auricular appendage, and ventricle as current complications following acute myocardial infarction: Secondary | ICD-10-CM | POA: Diagnosis not present

## 2020-02-16 DIAGNOSIS — D631 Anemia in chronic kidney disease: Secondary | ICD-10-CM | POA: Diagnosis not present

## 2020-02-16 DIAGNOSIS — N186 End stage renal disease: Secondary | ICD-10-CM | POA: Diagnosis not present

## 2020-02-16 DIAGNOSIS — Z992 Dependence on renal dialysis: Secondary | ICD-10-CM | POA: Diagnosis not present

## 2020-02-16 DIAGNOSIS — E876 Hypokalemia: Secondary | ICD-10-CM | POA: Diagnosis not present

## 2020-02-16 DIAGNOSIS — D689 Coagulation defect, unspecified: Secondary | ICD-10-CM | POA: Diagnosis not present

## 2020-02-16 DIAGNOSIS — T8249XD Other complication of vascular dialysis catheter, subsequent encounter: Secondary | ICD-10-CM | POA: Diagnosis not present

## 2020-02-16 NOTE — Patient Outreach (Signed)
[facility]) Care Management  02/16/2020  Paul Price August 14, 1940 [id number]  Initial telephone outreach PAC: ESRD and COVID. Unsuccessful, left message and requested a return call.  [name]. [name], MSN, [facility] Gerontological Nurse Practitioner [facility] Care Management [phone]

## 2020-02-17 ENCOUNTER — Telehealth (HOSPITAL_COMMUNITY): Payer: Self-pay

## 2020-02-17 ENCOUNTER — Telehealth: Payer: Self-pay | Admitting: Cardiovascular Disease

## 2020-02-17 DIAGNOSIS — J449 Chronic obstructive pulmonary disease, unspecified: Secondary | ICD-10-CM | POA: Diagnosis not present

## 2020-02-17 DIAGNOSIS — J1282 Pneumonia due to coronavirus disease 2019: Secondary | ICD-10-CM | POA: Diagnosis not present

## 2020-02-17 DIAGNOSIS — U071 COVID-19: Secondary | ICD-10-CM | POA: Diagnosis not present

## 2020-02-17 DIAGNOSIS — I480 Paroxysmal atrial fibrillation: Secondary | ICD-10-CM | POA: Diagnosis not present

## 2020-02-17 DIAGNOSIS — I5022 Chronic systolic (congestive) heart failure: Secondary | ICD-10-CM | POA: Diagnosis not present

## 2020-02-17 DIAGNOSIS — N185 Chronic kidney disease, stage 5: Secondary | ICD-10-CM | POA: Diagnosis not present

## 2020-02-17 DIAGNOSIS — D62 Acute posthemorrhagic anemia: Secondary | ICD-10-CM | POA: Diagnosis not present

## 2020-02-17 DIAGNOSIS — K922 Gastrointestinal hemorrhage, unspecified: Secondary | ICD-10-CM | POA: Diagnosis not present

## 2020-02-17 DIAGNOSIS — I1311 Hypertensive heart and chronic kidney disease without heart failure, with stage 5 chronic kidney disease, or end stage renal disease: Secondary | ICD-10-CM | POA: Diagnosis not present

## 2020-02-17 DIAGNOSIS — R2689 Other abnormalities of gait and mobility: Secondary | ICD-10-CM | POA: Diagnosis not present

## 2020-02-17 NOTE — Telephone Encounter (Signed)
Pharmacy Transitions of Care Follow-up Telephone Call  Date of discharge: 02/12/20  Discharge Diagnosis: Afib  How have you been since you were released from the hospital? Spoke with wife on the phone. Patient has been improving but he has been constipated. Recommended trying docusate OTC to start with and the add senna or miralax if not enough. Patient is also on dialysis  And struggles getting both his doses of Eliquis in since he has to hold the medication until after dialysis. Will message MD office to let them know about this issue. Patient aware of s/sx of bleeding to watch for.  Medication changes made at discharge: yes   Medication changes obtained and verified? yes    Medication Accessibility:  Home Pharmacy: [facility]  Was the patient provided with refills on discharged medications? no  . Is the patient able to afford medications? yes    Medication Review:  APIXABEN (ELIQUIS)  Apixaban 10 mg BID initiated on 02/12/20. Will switch to apixaban 5 mg BID on 02/14/20.  - Discussed importance of taking medication around the same time everyday  - Advised patient of medications to avoid (NSAIDs, ASA)  - Educated that Tylenol (acetaminophen) will be the preferred analgesic to prevent risk of bleeding  - Emphasized importance of monitoring for signs and symptoms of bleeding (abnormal bruising, prolonged bleeding, nose bleeds, bleeding from gums, discolored urine, black tarry stools)   Follow-up Appointments:  Follow up with Dr. Claiborne Billings in Cardiology on 02/19/20  If their condition worsens, is the pt aware to call PCP or go to the Emergency Dept.? yes  Final Patient Assessment: Patient is doing well, will contact Dr.Kelly's office to let them know about the struggle of taking Eliquis with dialysis. patient knows s/sx to look out for. Has f/u scheduled.

## 2020-02-17 NOTE — Telephone Encounter (Signed)
Routed to MD to discuss at [place] and pharmD to review.    Discharge med listed as following: apixaban 5 MG Tabs tablet Commonly known as: ELIQUIS Take 10mg  (2 tablets) by mouth twice daily on 02/12/20 and 2/4.  Then on 2/5, decrease to 5mg  (1 tablet) by mouth twice daily

## 2020-02-17 NOTE — Telephone Encounter (Signed)
Pt c/o medication issue:  1. Name of Medication: apixaban (ELIQUIS) 5 MG TABS tablet  2. How are you currently taking this medication (dosage and times per day)? On Dialysis days (3x /wk-MWF) pt only takes one dose. On non-dialysis days  pt takes both doses  3. Are you having a reaction (difficulty breathing--STAT)? no  4. What is your medication issue? [facility] called and wanted to make Dr. Claiborne Billings aware of the issue. [place] spoke with wife yesterday.  Please address at patient's OV 02/19/20

## 2020-02-17 NOTE — Telephone Encounter (Signed)
Note added to appointment notes  Thanks for the update

## 2020-02-18 DIAGNOSIS — J1282 Pneumonia due to coronavirus disease 2019: Secondary | ICD-10-CM | POA: Diagnosis not present

## 2020-02-18 DIAGNOSIS — Z992 Dependence on renal dialysis: Secondary | ICD-10-CM | POA: Diagnosis not present

## 2020-02-18 DIAGNOSIS — D689 Coagulation defect, unspecified: Secondary | ICD-10-CM | POA: Diagnosis not present

## 2020-02-18 DIAGNOSIS — D509 Iron deficiency anemia, unspecified: Secondary | ICD-10-CM | POA: Diagnosis not present

## 2020-02-18 DIAGNOSIS — D631 Anemia in chronic kidney disease: Secondary | ICD-10-CM | POA: Diagnosis not present

## 2020-02-18 DIAGNOSIS — R2689 Other abnormalities of gait and mobility: Secondary | ICD-10-CM | POA: Diagnosis not present

## 2020-02-18 DIAGNOSIS — N186 End stage renal disease: Secondary | ICD-10-CM | POA: Diagnosis not present

## 2020-02-18 DIAGNOSIS — T8249XD Other complication of vascular dialysis catheter, subsequent encounter: Secondary | ICD-10-CM | POA: Diagnosis not present

## 2020-02-18 DIAGNOSIS — E46 Unspecified protein-calorie malnutrition: Secondary | ICD-10-CM | POA: Insufficient documentation

## 2020-02-18 DIAGNOSIS — U071 COVID-19: Secondary | ICD-10-CM | POA: Diagnosis not present

## 2020-02-18 DIAGNOSIS — K922 Gastrointestinal hemorrhage, unspecified: Secondary | ICD-10-CM | POA: Diagnosis not present

## 2020-02-18 DIAGNOSIS — N2581 Secondary hyperparathyroidism of renal origin: Secondary | ICD-10-CM | POA: Diagnosis not present

## 2020-02-19 ENCOUNTER — Other Ambulatory Visit: Payer: Self-pay | Admitting: *Deleted

## 2020-02-19 ENCOUNTER — Encounter: Payer: Self-pay | Admitting: *Deleted

## 2020-02-19 ENCOUNTER — Encounter: Payer: Self-pay | Admitting: Cardiovascular Disease

## 2020-02-19 ENCOUNTER — Other Ambulatory Visit: Payer: Self-pay

## 2020-02-19 ENCOUNTER — Ambulatory Visit: Payer: Medicare Other | Admitting: Cardiovascular Disease

## 2020-02-19 ENCOUNTER — Telehealth: Payer: Self-pay | Admitting: *Deleted

## 2020-02-19 VITALS — BP 122/72 | HR 81 | Ht 66.0 in | Wt 172.6 lb

## 2020-02-19 DIAGNOSIS — Z7901 Long term (current) use of anticoagulants: Secondary | ICD-10-CM | POA: Diagnosis not present

## 2020-02-19 DIAGNOSIS — Z951 Presence of aortocoronary bypass graft: Secondary | ICD-10-CM | POA: Diagnosis not present

## 2020-02-19 DIAGNOSIS — K922 Gastrointestinal hemorrhage, unspecified: Secondary | ICD-10-CM | POA: Diagnosis not present

## 2020-02-19 DIAGNOSIS — G4733 Obstructive sleep apnea (adult) (pediatric): Secondary | ICD-10-CM | POA: Diagnosis not present

## 2020-02-19 DIAGNOSIS — I2510 Atherosclerotic heart disease of native coronary artery without angina pectoris: Secondary | ICD-10-CM | POA: Diagnosis not present

## 2020-02-19 DIAGNOSIS — I513 Intracardiac thrombosis, not elsewhere classified: Secondary | ICD-10-CM | POA: Diagnosis not present

## 2020-02-19 DIAGNOSIS — N186 End stage renal disease: Secondary | ICD-10-CM

## 2020-02-19 DIAGNOSIS — I480 Paroxysmal atrial fibrillation: Secondary | ICD-10-CM | POA: Diagnosis not present

## 2020-02-19 DIAGNOSIS — R2689 Other abnormalities of gait and mobility: Secondary | ICD-10-CM | POA: Diagnosis not present

## 2020-02-19 DIAGNOSIS — Z9989 Dependence on other enabling machines and devices: Secondary | ICD-10-CM

## 2020-02-19 DIAGNOSIS — I5022 Chronic systolic (congestive) heart failure: Secondary | ICD-10-CM | POA: Diagnosis not present

## 2020-02-19 DIAGNOSIS — Z950 Presence of cardiac pacemaker: Secondary | ICD-10-CM | POA: Diagnosis not present

## 2020-02-19 DIAGNOSIS — D62 Acute posthemorrhagic anemia: Secondary | ICD-10-CM | POA: Diagnosis not present

## 2020-02-19 DIAGNOSIS — N185 Chronic kidney disease, stage 5: Secondary | ICD-10-CM | POA: Diagnosis not present

## 2020-02-19 DIAGNOSIS — I1311 Hypertensive heart and chronic kidney disease without heart failure, with stage 5 chronic kidney disease, or end stage renal disease: Secondary | ICD-10-CM | POA: Diagnosis not present

## 2020-02-19 DIAGNOSIS — J1282 Pneumonia due to coronavirus disease 2019: Secondary | ICD-10-CM | POA: Diagnosis not present

## 2020-02-19 DIAGNOSIS — J449 Chronic obstructive pulmonary disease, unspecified: Secondary | ICD-10-CM | POA: Diagnosis not present

## 2020-02-19 DIAGNOSIS — U071 COVID-19: Secondary | ICD-10-CM | POA: Diagnosis not present

## 2020-02-19 NOTE — Telephone Encounter (Signed)
Discussed at [place] 2/10 with Dr. Everlena Cooper is taking Eliquis 5 mg BID everyday now (dialysis and non dialysis days)

## 2020-02-19 NOTE — Progress Notes (Signed)
Patient ID: Paul Price, male   DOB: July 23, 1940, 80 y.o.   MRN: [id number]     Primary MD:  Dr. Merri Brunette  HPI: Paul Price is a 80 y.o. male who presents for a 6 month cardiology followup evaluation.   Paul Price has established CAD and in 1992 underwent CABG revascularization surgery LIMA to the LAD and diagonal. In 2009 Paul Price was found to have an atretic LIMA graft to the LAD and a patent vein graft supplying the diagonal vessel. Paul Price had 70% narrowing in the LAD beyond the diagonal was treated medically. In April 2012 due to progressive chest pain and scintigraphic evidence for ischemia in the mid distal LAD territory repeat catheterization was performed and Paul Price underwent successful stenting to his LAD beyond the diagonal with a 3.0x20 mm Promus DES stent postdilated 3.25 mm. Subsequent, Paul Price has remained active and has done well. His last stress test in 2013 showed an apical defect without ischemia.  Additional problems include obstructive sleep apnea on CPAP therapy, moderate obesity, hypertension, history of DVT, and hyperlipidemia.  Paul Price sees Dr. Renne Crigler for primary care.  On November 19, 2012 Paul Price saw Paul Price.PAC for suspected atrial fibrillation and CHF after having been seen by his primary physician and noted some increasing shortness of breath with activity. A CardioNet monitor  demonstrated  a 4.3 second pause which led to a reduction in his  beta blocker dose  from Lopressor 75 mg twice a day to 25 twice a day. Paul Price saw Paul Price on 12/02/2012 at which time Paul Price was in sinus rhythm but also was found to have probably 9 beat run of nonsustained VT on monitor which is Paul Price was maintained on low-dose beta blocker therapy. Subsequent cardiac monitoring did not show any further episodes of significant bradycardia but Paul Price did have one additional 9 beat run of nonsustained PSVT at a rate of 109 beats per minute on 12/17/12. . Laboratory done by Dr. Renne Crigler on 11/27/2012 showed a potassium of 4.2. His  creatinine was 1.82. BUN 33.  An echo Doppler study done 12/09/2012 showed an estimated ejection fraction at 50-55%. Although no diagnostic regional wall motion abnormalities were definitively identified, this possibility was not completely excluded. There was grade 2 diastolic dysfunction. There is mild left atrial dilatation and mild mitral regurgitation.  A renal ultrasound has  demonstrate bilateral small renal cysts.    In 2015 Paul Price was found to have a DVT in his left leg and was started on eliquis by Dr. Renne Crigler.  Paul Price was hospitalized on November 23 through 12/09/2013 and presented with fevers, and sudden onset of severe back pain.  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  Blood cultures were negative.   After 1 week in the hospital, Paul Price spent 3 weeks at rehabilitation.  Paul Price was treated with 4 weeks of antibiotic therapy and after his hospitalization.  Paul Price was followed by Dr. Cliffton Asters from infectious disease.  Paul Price last saw him in March 2016 and att that time Paul Price was felt to be completely cleared cured.    Paul Price has renal insufficiency and has been followed by Dr. Desma Paganini since Paul Price had developed acute on chronic kidney injury.  His serum creatinine peaked at 2.8.  Ultimately, this has improved.    When I saw him in follow-up, Paul Price continued to note lower extremity edema. Paul Price was no longer anticoagulation but has been on Plavix and aspirin.  I scheduled him for follow-up lower extremity venous ultrasound which  showed resolution of his left distal femoral vein and popliteal vein thrombus . His blood pressure has been treated with Toprol-XL 50 mg losartan 100 mg furosemide 40 mg and amlodipine 10 mg.  Paul Price is on lipid lowering therapy with atorvastatin.  Paul Price takes allopurinol for gout.  There is history of GERD treated with ranitidine.    Paul Price admits to using his CPAP with 100% compliance.  Paul Price states Paul Price has a new machine.  However, recently Paul Price has been noticing frequent awakenings.  Paul Price denies  awareness of breakthrough snoring.    When I saw him in April 2018, Paul Price noticed a change in symptomatology with more progressive exertional dyspnea.  Paul Price denied any exertional chest pressure.  However, Paul Price has noticed some nonexertional constant chest wall ache.  His shortness of breath was occurring with less activity.  Paul Price is unaware of palpitations.  Paul Price denies presyncope or syncope.  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   An echo Doppler study on 04/28/2016 showed an EF of 50-55%.  There was grade 2 diastolic dysfunction.  Septal motion.  She showed paradoxical motion.  The left atrium was severely dilated.  There was mild MR.  The RV was mildly dilated, as was the right atrium.  There was mild palmar hypertension with PA pressure 33.  Paul Price underwent a nuclear perfusion study to further evaluate his symptoms.  This remained low risk.  Although calculated at 47% EF, visually it appeared to be 55%.  There was a small nonreversible defect in the mid anterior and apical location without ischemia.  Paul Price also underwent lower extremity arterial Doppler studies which showed normal ABIs bilaterally and there was no evidence for segmental lower extremity arterial disease. Creatinine was 1.76 on 04/14/2016; Hemoglobin A1c was increased at 6.7.  Lipid studies revealed a TC 128, triglyceride 152, LDL 66, and Paul Price has a low HDL level of 32.    Paul Price was hospitalized on July 31 through 08/10/2016 and was found to have atrial fibrillation with rapid ventricular response as well as acute renal failure.  His creatinine had risen to 2.9 and improved to 1.9.  With gentle hydration.  His losartan and spironolactone were held.  Paul Price was started on Xarelto at discharge.  On August 29 through 09/09/2016.  Paul Price was rehospitalized with GI bleed and acute blood loss anemia.  Paul Price underwent endoscopy and colonoscopy and GI recommended continuation of  anticoagulation with concomitant Protonix therapy.  Paul Price tells me Paul Price received 3 units of blood.  During his initial hospitalization on August 1.  An echo Doppler study showed an EF of 45-50% with diffuse hypocontractility.  There was aortic sclerosis without stenosis, mitral annular calcification, and mild LA dilation.  Paul Price continues to be on CPAP.   Paul Price was hospitalized in December 2018 for dyspnea on exertion accompanied with lightheadedness.  Paul Price was noted to have Mobitz type I heart block during his hospitalization and beta-blocker was discontinued due to suspicion of sick sinus syndrome.  Paul Price was ultimately evaluated by Dr. Caryl Comes and underwent pacemaker implantation on December 18.  Subsequently, Paul Price has not had any recurrent syncope or presyncope.  A pacemaker device check in March 2019 showed normal device function.  I last saw him in June 2019.  Since his pacemaker insertion Paul Price denied any recurrent episodes of dizziness.  His weakness has resolved and shortness of breath is improved.  Paul Price  continues to use CPAP with 100% compliance.  Paul Price is unaware of any atrial fibrillation.  Paul Price admits to occasional leg swelling.  Paul Price denies any chest pain.    Paul Price saw Dr. Adam Phenix on December 12, 2018 in follow-up of his pacemaker.  Apparently, for some time prior to that evaluation his amlodipine which had been 7.5 mg was discontinued by Dr. Shelia Media due to his concern according to the patient that his blood pressure was getting low.  When evaluated by Dr. Caryl Comes in December, blood pressure was elevated at 150/78.  Paul Price was advised to reinstitute amlodipine at 2.5 mg.  However, the patient has subsequently increase this to 5 mg due to continued blood pressure elevations at home with typical blood pressures greater than 161 systolically.  Paul Price has continued to be on anticoagulation therapy with Xarelto with his history of PAF and is on a reduced dose with his renal insufficiency.  Paul Price is followed by Dr. Azzie Roup for stage IV chronic  kidney disease with creatinine 2.39 in November 2020.  Paul Price denies any chest pain PND orthopnea.  I last saw him in August 2021 and since his prior evaluation in January 2021, Paul Price continued to do well.  Apparently his amlodipine dose was reduced from 5 down to 2.5 mg since his blood pressure has become somewhat low.  Paul Price has undergone supplemental iron injections by Dr. Irene Limbo for his anemia.  Paul Price continues to be without anginal symptomatology on amlodipine 2.5 mg, metoprolol succinate 25 mg daily and Paul Price continues to be on furosemide 20 mg.  Paul Price states his blood pressure at home typically is in the 120s to low 130s.  Paul Price recently underwent laboratory on August 12, 2019 which revealed hemoglobin of 8 2 hematocrit 27.0.  Paul Price had macrocytic indices.  B12 level was normal.  Paul Price has stage IV chronic kidney disease and creatinine had slightly improved to 3.14 from 3.47 in May 2021.  Paul Price is followed by Dr. Meredeth Ide Paul Price also had seen Dr. Donnetta Hutching.  Since I last saw him, Paul Price was started on dialysis therapy for end-stage renal disease Paul Price had been hospitalized in early January 2022 with Covid pneumonia and was treated with read them severe and steroids.  After discharge was readmitted several days later on February 01, 2020 with upper GI bleeding, AKI and uremic symptoms.  A transesophageal echo showed an EF of 40 to 45% and there was evidence for a left atrial appendage thrombus measuring 1.5 x 0.6 cm.  Paul Price was ultimately given clearance by GI to resume anticoagulation and was transitioned from Xarelto to Eliquis.  Paul Price was started on hemodialysis on February 06, 2020 and his symptoms improved with dialysis.  Paul Price underwent basilic vein fistula placement and conversion to hemodialysis tunneled catheter on January 31 by Dr. Carlis Abbott.  Paul Price is now undergoing dialysis on Monday Wednesday and Friday.  Paul Price has been home a week.  Paul Price is having dialysis at [facility].  Paul Price presents for follow-up evaluation.  Past Medical History:  Diagnosis  Date  . Arthritis    "shoulders" (09/07/2016)  . Atrial fibrillation ([place])   . BPH (benign prostatic hyperplasia)   . Chronic kidney disease (CKD), stage III (moderate) (HCC)   . Coronary artery disease   . DDD (degenerative disc disease), cervical   . DVT (deep venous thrombosis) ([place]) 12/2013   LLE  . GERD (gastroesophageal reflux disease)   . Gout   . High cholesterol   . History of blood transfusion 09/06/2016  2 u PRBC  . History of scarlet fever 1940s  . History of stress test 06/2011   No significant ischemia, this is a low risk scan. Clinical correlation recommended Abnormal myocardial perfusion study.  Marland Kitchen Hx of echocardiogram 05/2009   EF 40-45%, Paul Price did have mild annular calcification with mild-to-moderate MR and mild TR as well as aortic valve sclerosis. Estimated RV systolic pressure was 21 mm.  . Hypertension   . Iron deficiency anemia   . Kidney failure   . 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 Neck pain    "not chronic" (09/07/2016)  . Obesity   . OSA on CPAP   . RBBB   . Renal insufficiency   . Right renal mass   . Spinal stenosis of lumbar region     Past Surgical History:  Procedure Laterality Date  . APPENDECTOMY    . AV FISTULA PLACEMENT Right 02/09/2020   Procedure: ARTERIOVENOUS (AV) FISTULA  CREATION RIGHT UPPER EXTREMITY;  Surgeon: Marty Heck, MD;  Location: [place];  Service: Vascular;  Laterality: Right;  . CARDIAC CATHETERIZATION  2009   Paul Price was found to have a atretic LIMA graft to his LAD and had a patent vein graft supplying his diagonal vessel. At that time, Paul Price had 70% narrowing in his LAD beyond a diagonal vessel which was initially treated medically.  . COLONOSCOPY WITH PROPOFOL Left 09/09/2016   Procedure: COLONOSCOPY WITH PROPOFOL;  Surgeon: Arta Silence, MD;  Location: [place];  Service: Endoscopy;  Laterality: Left;  . CORONARY ANGIOPLASTY WITH STENT PLACEMENT  April 2012   LAD DES  . CORONARY ARTERY BYPASS GRAFT  1992    with LIMA to the LAD and diagonal.  . CYSTOSCOPY N/A 03/31/2019   Procedure: CYSTOSCOPY FLEXIBLE;  Surgeon: Lucas Mallow, MD;  Location: [facility];  Service: Urology;  Laterality: N/A;  . CYSTOSCOPY WITH URETHRAL DILATATION N/A 05/21/2019   Procedure: CYSTOSCOPY WITH URETHRAL BALLOON DILATATION;  Surgeon: Lucas Mallow, MD;  Location: [facility];  Service: Urology;  Laterality: N/A;  . ESOPHAGOGASTRODUODENOSCOPY N/A 11/16/2019   Procedure: ESOPHAGOGASTRODUODENOSCOPY (EGD);  Surgeon: Wilford Corner, MD;  Location: [place];  Service: Endoscopy;  Laterality: N/A;  . ESOPHAGOGASTRODUODENOSCOPY (EGD) WITH PROPOFOL Left 09/08/2016   Procedure: ESOPHAGOGASTRODUODENOSCOPY (EGD) WITH PROPOFOL;  Surgeon: Ronnette Juniper, MD;  Location: [place];  Service: Gastroenterology;  Laterality: Left;  . EXCHANGE OF A DIALYSIS CATHETER Right 02/09/2020   Procedure: EXCHANGE OF A DIALYSIS CATHETER;  Surgeon: Marty Heck, MD;  Location: [place];  Service: Vascular;  Laterality: Right;  . GIVENS CAPSULE STUDY N/A 11/16/2019   Procedure: GIVENS CAPSULE STUDY;  Surgeon: Wilford Corner, MD;  Location: [facility];  Service: Endoscopy;  Laterality: N/A;  . INGUINAL HERNIA REPAIR     "don't remeimber which side"  . IR FLUORO GUIDE CV LINE RIGHT  02/05/2020  . IR US GUIDE VASC ACCESS RIGHT  02/05/2020  . LAPAROSCOPIC NEPHRECTOMY Right 03/31/2019   Procedure: RIGHT  HAND ASSIST LAPAROSCOPIC NEPHRECTOMY;  Surgeon: Lucas Mallow, MD;  Location: [facility];  Service: Urology;  Laterality: Right;  . PACEMAKER IMPLANT N/A 12/25/2016   Procedure: PACEMAKER IMPLANT;  Surgeon: Deboraha Sprang, MD;  Location: [place] CV LAB;  Service: Cardiovascular;  Laterality: N/A;  . TEE WITHOUT CARDIOVERSION N/A 02/06/2020   Procedure: TRANSESOPHAGEAL ECHOCARDIOGRAM (TEE);  Surgeon: Skeet Latch, MD;  Location: [place];  Service: Cardiovascular;  Laterality: N/A;  . TONSILLECTOMY  Allergies  Allergen  Reactions  . Simvastatin Other (See Comments)    Abdominal cramps  . Nitrostat [Nitroglycerin] Other (See Comments)    Causes blood pressure to "bottom out"    Current Outpatient Medications  Medication Sig Dispense Refill  . acetaminophen (TYLENOL) 500 MG tablet Take 500-1,000 mg by mouth every 6 (six) hours as needed for mild pain, moderate pain or headache.     . allopurinol (ZYLOPRIM) 300 MG tablet Take 300 mg by mouth daily.    Marland Kitchen amLODipine (NORVASC) 5 MG tablet Take 2.5 mg by mouth daily.     Marland Kitchen apixaban (ELIQUIS) 5 MG TABS tablet Take $RemoveBef'10mg'tYJUkGSzhb$  (2 tablets) by mouth twice daily on 02/12/20 and 2/4.  Then on 2/5, decrease to $RemoveBef'5mg'EIpmOCjvir$  (1 tablet) by mouth twice daily 62 tablet 0  . atorvastatin (LIPITOR) 20 MG tablet Take 20 mg by mouth daily.    Marland Kitchen b complex vitamins tablet Take 1 tablet by mouth daily.    Marland Kitchen docusate sodium (COLACE) 100 MG capsule Take 2 capsules (200 mg total) by mouth daily. 30 capsule 0  . metoprolol succinate (TOPROL-XL) 25 MG 24 hr tablet Take 25 mg by mouth daily.    . pantoprazole (PROTONIX) 40 MG tablet Take 40 mg by mouth daily.    . Tamsulosin HCl (FLOMAX) 0.4 MG CAPS Take 1 capsule (0.4 mg total) by mouth daily. 30 capsule 0   No current facility-administered medications for this visit.    Social History   Socioeconomic History  . Marital status: Married    Spouse name: Not on file  . Number of children: Not on file  . Years of education: Not on file  . Highest education level: Not on file  Occupational History  . Not on file  Tobacco Use  . Smoking status: Former Smoker    Packs/day: 1.00    Years: 30.00    Pack years: 30.00    Types: Cigarettes  . Smokeless tobacco: Never Used  . Tobacco comment: quit ~ 1985  Vaping Use  . Vaping Use: Never used  Substance and Sexual Activity  . Alcohol use: Yes    Alcohol/week: 0.0 standard drinks    Comment: rare beer  . Drug use: No  . Sexual activity: Not Currently  Other Topics Concern  . Not on file  Social  History Narrative  . Not on file   Social Determinants of Health   Financial Resource Strain: Not on file  Food Insecurity: Not on file  Transportation Needs: Not on file  Physical Activity: Not on file  Stress: Not on file  Social Connections: Not on file  Intimate Partner Violence: Not on file    Socially Paul Price is married, has 3 children 7 grandchildren. Paul Price does not use tobacco or alcohol.  ROS General: Negative; No fevers, chills, or night sweats; Moderate obesity HEENT: Negative; No changes in vision or hearing, sinus congestion, difficulty swallowing Pulmonary: Negative; No cough, wheezing, shortness of breath, hemoptysis Cardiovascular: Negative; No chest pain, presyncope, syncope, palpatations GI: Recent GI bleed, status post endoscopy and colonoscopy without definitive abnormality. GU: Recent initiation of dialysis Musculoskeletal: History of paraspinal myositis  Hematologic/Oncology: Negative; no easy bruising, bleeding Endocrine: Negative; no heat/cold intolerance; no diabetes Neuro: Occasional paresthesias; no changes in balance, headaches Skin: Negative; No rashes or skin lesions Psychiatric: Negative; No behavioral problems, depression Sleep: Positive for sleep apnea on CPAP; No snoring, daytime sleepiness, hypersomnolence, bruxism, restless legs, hypnogognic hallucinations, no cataplexy Other comprehensive 14 point system review  is negative.   PE BP 122/72   Pulse 81   Ht $R'5\' 6"'uf$  (1.676 m)   Wt 172 lb 9.6 oz (78.3 kg)   SpO2 94%   BMI 27.86 kg/m    Repeat blood pressure by me was 114/72  Wt Readings from Last 3 Encounters:  02/19/20 172 lb 9.6 oz (78.3 kg)  02/10/20 177 lb 0.5 oz (80.3 kg)  01/25/20 184 lb 1.4 oz (83.5 kg)   General: Alert, oriented, no distress.  Skin: normal turgor, no rashes, warm and dry HEENT: Normocephalic, atraumatic. Pupils equal round and reactive to light; sclera anicteric; extraocular muscles intact;  Nose without nasal septal  hypertrophy Mouth/Parynx benign; Mallinpatti scale 3 Neck: No JVD, no carotid bruits; normal carotid upstroke Lungs: clear to ausculatation and percussion; no wheezing or rales Chest wall: without tenderness to palpitation Heart: PMI not displaced, RRR, s1 s2 normal, 1/6 systolic murmur, no diastolic murmur, no rubs, gallops, thrills, or heaves Abdomen: soft, nontender; no hepatosplenomehaly, BS+; abdominal aorta nontender and not dilated by palpation. Back: no CVA tenderness Pulses 2+ Musculoskeletal: full range of motion, normal strength, no joint deformities Extremities: no clubbing cyanosis or edema, Homan's sign negative  Neurologic: grossly nonfocal; Cranial nerves grossly wnl Psychologic: Normal mood and affect   ECG (independently read by me): V paced at 81 with 100% capture.  August 2021 ECG (independently read by me): Atrial sensed, ventricular paced rhythm at 68 bpm  January 13, 2019 ECG (independently read by me): Ventricular paced rhythm at 68 bpm.  June 2019 ECG (independently read by me): Sinus rhythm appropriate atrial sensing and atrial pacing,  right bundle branch block, left axis deviation  September 2018 ECG (independently read by me): normal sinus rhythm at 60 bpm.  Incomplete right bundle branch block.  Mild inferolateral ST changes.  May 2018 ECG (independently read by me): sinus bradycardia 58 bpm.  Left axis deviation.  Right bundle branch block with repolarization changes.  QTc interval 471 ms.  April 2018 ECG (independently read by me):NSR At 61, left axis deviation.  Right bundle branch block.  September 2017 ECG (independently read by me): Normal sinus rhythm with mild sinus arrhythmia.  First degree AV block with a PR interval at 212 ms.  Right bundle-branch block.  March 2017 ECG (independently read by me): Normal sinus rhythm at 61 bpm.  Right bundle branch block with repolarization changes.  QTc interval 477 ms.  PR interval 204 ms.  October 2016 ECG  (independently read by me): Normal sinus rhythm at 65 bpm.  Right bundle branch block.  First degree AV block.  June 2015 ECG (independently read by me): Normal sinus rhythm at 61 beats per minute.  Right bundle branch block.  Mild voltage criteria for LVH in aVL  ECG (independently read by me): Sinus rhythm at 60 beats per minute. Right bundle branch block; PAC, inferolateral T abnormalities.  LABS: I personally reviewed the lab work from November 15, 2018.  BMP Latest Ref Rng & Units 02/12/2020 02/11/2020 02/10/2020  Glucose 70 - 99 mg/dL 93 84 88  BUN 8 - 23 mg/dL 26(H) 19 19  Creatinine 0.61 - 1.24 mg/dL 3.58(H) 3.23(H) 2.63(H)  BUN/Creat Ratio 10 - 24 - - -  Sodium 135 - 145 mmol/L 141 141 141  Potassium 3.5 - 5.1 mmol/L 4.2 4.0 4.1  Chloride 98 - 111 mmol/L 107 105 106  CO2 22 - 32 mmol/L $RemoveB'22 25 25  'XOSAhDyR$ Calcium 8.9 - 10.3 mg/dL 7.6(L) 7.7(L) 7.7(L)  Hepatic Function Latest Ref Rng & Units 02/12/2020 02/10/2020 02/06/2020  Total Protein 6.5 - 8.1 g/dL - - -  Albumin 3.5 - 5.0 g/dL 1.8(L) 1.7(L) 1.7(L)  AST 15 - 41 U/L - - -  ALT 0 - 44 U/L - - -  Alk Phosphatase 38 - 126 U/L - - -  Total Bilirubin 0.3 - 1.2 mg/dL - - -   CBC Latest Ref Rng & Units 02/12/2020 02/11/2020 02/10/2020  WBC 4.0 - 10.5 K/uL 9.4 9.2 9.5  Hemoglobin 13.0 - 17.0 g/dL 8.7(L) 8.5(L) 8.4(L)  Hematocrit 39.0 - 52.0 % 27.9(L) 28.5(L) 28.7(L)  Platelets 150 - 400 K/uL 141(L) 145(L) 148(L)   Lab Results  Component Value Date   MCV 85.6 02/12/2020   MCV 86.9 02/11/2020   MCV 87.8 02/10/2020   Lab Results  Component Value Date   TSH 2.159 01/21/2020    Lab Results  Component Value Date   HGBA1C 6.1 (H) 02/04/2020     Lipid Panel     Component Value Date/Time   CHOL 104 12/22/2016 0535   TRIG 91 12/22/2016 0535   HDL 29 (L) 12/22/2016 0535   CHOLHDL 3.6 12/22/2016 0535   VLDL 18 12/22/2016 0535   LDLCALC 57 12/22/2016 0535     BNP No results found for: PROBNP  Lipid Panel     Component Value Date/Time    CHOL 104 12/22/2016 0535   Lipid Panel     Component Value Date/Time   CHOL 104 12/22/2016 0535   TRIG 91 12/22/2016 0535   HDL 29 (L) 12/22/2016 0535   CHOLHDL 3.6 12/22/2016 0535   VLDL 18 12/22/2016 0535   LDLCALC 57 12/22/2016 0535    RADIOLOGY: No results found.  IMPRESSION:  1. CAD in native artery   2. Hx of CABG   3. OSA on CPAP   4. End stage renal disease ([place])   5. Left atrial thrombus   6. Anticoagulated   7. Pacemaker     ASSESSMENT AND PLAN: [name] is an 81 year old Caucasian male who is 29 years status post CABG revascularization surgery in 1992 and 9 years since a stent was placed in the LAD beyond this diagonal vessel in 2012. Paul Price has a documented atretic LIMA graft and a patent vein graft which supplies this diagonal graft.   His prior echo Doppler study in 2013 showed an ejection fraction of 50-55% and probable grade 2 diastolic dysfunction. Paul Price did have mild left atrial dilatation and mild mitral regurgitation.  On his echo Doppler study in April 2018 EF remained stable at 42-68%; grade 2 diastolic dysfunction, and there was evidence for  severe left atrial enlargement with mild right atrial enlargement.  Mild pulmonary hypertension.  His nuclear perfusion study remained in the low risk with only a small defect in the mid apical and anterior wall consistent with prior infarction without associated ischemia.  Paul Price has a history of PAF which Paul Price was started on anticoagulation therapy and also has developed renal insufficiency.  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 Caryl Comes was continue to follow him from a pacemaker standpoint.  Over the last several years Paul Price developed stage IV CKD with ultimate progression to end-stage renal disease.  Paul Price developed uremic symptoms following his Covid infection and has now been on dialysis  therapy since the end of January 2022.  During his frequent  hospitalizations Paul Price is not able to use his CPAP therapy.  Paul Price plans to reinstitute this now that Paul Price is back home and feeling more stable.  I will change him from a 13 cm set pressure to an auto mode at 11-20 since his download had shown significant AHI elevation but use was suboptimal due to being in the hospital on 2 occasions in January.  Presently, Paul Price does not have any chest pain or anginal symptoms.  Paul Price is undergoing dialysis on Monday Wednesday and Fridays at the [facility].  His ECG demonstrates a ventricular paced rhythm.  Paul Price is on metoprolol succinate 25 mg.  Paul Price continues to be on atorvastatin 20 mg for hyperlipidemia with target LDL less than 70.  Due to his history of atrial fibrillation and left atrial appendage thrombus Paul Price was started on Xarelto and is now on Eliquis for anticoagulation.  His most recent echo Doppler study has shown an EF of 40 to 45% with thrombus in the left atrial appendage measuring 1.5 x 0.6 cm.  Paul Price will be following up with Dr. Caryl Comes for pacemaker evaluation.  I will see him in 6 months or sooner for cardiology reevaluation.    Troy Sine, MD, [facility]  02/25/2020 3:22 PM

## 2020-02-19 NOTE — Patient Instructions (Signed)
Medication Instructions:  Your physician recommends that you continue on your current medications as directed. Please refer to the Current Medication list given to you today.  *If you need a refill on your cardiac medications before your next appointment, please call your pharmacy*   Follow-Up: At [facility], you and your health needs are our priority.  As part of our continuing mission to provide you with exceptional heart care, we have created designated Provider Care Teams.  These Care Teams include your primary Cardiologist (physician) and Advanced Practice Providers (APPs -  Physician Assistants and Nurse Practitioners) who all work together to provide you with the care you need, when you need it.  We recommend signing up for the patient portal called "MyChart".  Sign up information is provided on this After Visit Summary.  MyChart is used to connect with patients for Virtual Visits (Telemedicine).  Patients are able to view lab/test results, encounter notes, upcoming appointments, etc.  Non-urgent messages can be sent to your provider as well.   To learn more about what you can do with MyChart, go to [email or web address].    Your next appointment:   6 month(s)  The format for your next appointment:   In Person  Provider:   Thomas Kelly, MD  

## 2020-02-19 NOTE — Patient Outreach (Signed)
[facility]) Care Management  02/19/2020  TRAVES MAJCHRZAK 05/20/1940 [id number]  Telephone outreach PAC: New dialysis pt, new medications.  Med hx includes but not limited to: CAD, CHF, HTN. AFIB. Hyperlipidemia, ESRD and Anemia  Pt resides with his wife. He has had 4 dialysis treatments since his discharge. He is getting the hand of the routine. He reports feeling very tired and weak after each treatment.  Patient was recently discharged from hospital and all medications have been reviewed. Outpatient Encounter Medications as of 02/19/2020  Medication Sig  . acetaminophen (TYLENOL) 500 MG tablet Take 500-1,000 mg by mouth every 6 (six) hours as needed for mild pain, moderate pain or headache.   . allopurinol (ZYLOPRIM) 300 MG tablet Take 300 mg by mouth daily.  Marland Kitchen amLODipine (NORVASC) 5 MG tablet Take 2.5 mg by mouth daily.   Marland Kitchen apixaban (ELIQUIS) 5 MG TABS tablet Take 10mg  (2 tablets) by mouth twice daily on 02/12/20 and 2/4.  Then on 2/5, decrease to 5mg  (1 tablet) by mouth twice daily  . atorvastatin (LIPITOR) 20 MG tablet Take 20 mg by mouth daily.  Marland Kitchen b complex vitamins tablet Take 1 tablet by mouth daily.  Marland Kitchen docusate sodium (COLACE) 100 MG capsule Take 2 capsules (200 mg total) by mouth daily.  . metoprolol succinate (TOPROL-XL) 25 MG 24 hr tablet Take 25 mg by mouth daily.  . pantoprazole (PROTONIX) 40 MG tablet Take 40 mg by mouth daily.  . Tamsulosin HCl (FLOMAX) 0.4 MG CAPS Take 1 capsule (0.4 mg total) by mouth daily.   No facility-administered encounter medications on file as of 02/19/2020.   Mrs. Dennin reports his new medications are Eliquis and Pantoprazole and Docusate Sodium. She is hoping that his new Rxs will last until he can be seen on March 8th.   We agreed to talk again in one week.  [name]. [name], MSN, [facility] Gerontological Nurse Practitioner [facility] Care Management [phone]

## 2020-02-19 NOTE — Telephone Encounter (Signed)
Patient notified of CPAP pressure change ordered by Dr Claiborne Billings. Repeat download will be done in 30 days.

## 2020-02-20 DIAGNOSIS — T8249XD Other complication of vascular dialysis catheter, subsequent encounter: Secondary | ICD-10-CM | POA: Diagnosis not present

## 2020-02-20 DIAGNOSIS — Z992 Dependence on renal dialysis: Secondary | ICD-10-CM | POA: Diagnosis not present

## 2020-02-20 DIAGNOSIS — D509 Iron deficiency anemia, unspecified: Secondary | ICD-10-CM | POA: Diagnosis not present

## 2020-02-20 DIAGNOSIS — D689 Coagulation defect, unspecified: Secondary | ICD-10-CM | POA: Diagnosis not present

## 2020-02-20 DIAGNOSIS — N186 End stage renal disease: Secondary | ICD-10-CM | POA: Diagnosis not present

## 2020-02-20 DIAGNOSIS — N2581 Secondary hyperparathyroidism of renal origin: Secondary | ICD-10-CM | POA: Diagnosis not present

## 2020-02-20 DIAGNOSIS — D631 Anemia in chronic kidney disease: Secondary | ICD-10-CM | POA: Diagnosis not present

## 2020-02-23 DIAGNOSIS — Z992 Dependence on renal dialysis: Secondary | ICD-10-CM | POA: Diagnosis not present

## 2020-02-23 DIAGNOSIS — N2581 Secondary hyperparathyroidism of renal origin: Secondary | ICD-10-CM | POA: Diagnosis not present

## 2020-02-23 DIAGNOSIS — T8249XD Other complication of vascular dialysis catheter, subsequent encounter: Secondary | ICD-10-CM | POA: Diagnosis not present

## 2020-02-23 DIAGNOSIS — D509 Iron deficiency anemia, unspecified: Secondary | ICD-10-CM | POA: Diagnosis not present

## 2020-02-23 DIAGNOSIS — N186 End stage renal disease: Secondary | ICD-10-CM | POA: Diagnosis not present

## 2020-02-23 DIAGNOSIS — D689 Coagulation defect, unspecified: Secondary | ICD-10-CM | POA: Diagnosis not present

## 2020-02-23 DIAGNOSIS — D631 Anemia in chronic kidney disease: Secondary | ICD-10-CM | POA: Diagnosis not present

## 2020-02-24 DIAGNOSIS — I1311 Hypertensive heart and chronic kidney disease without heart failure, with stage 5 chronic kidney disease, or end stage renal disease: Secondary | ICD-10-CM | POA: Diagnosis not present

## 2020-02-24 DIAGNOSIS — D62 Acute posthemorrhagic anemia: Secondary | ICD-10-CM | POA: Diagnosis not present

## 2020-02-24 DIAGNOSIS — U071 COVID-19: Secondary | ICD-10-CM | POA: Diagnosis not present

## 2020-02-24 DIAGNOSIS — J1282 Pneumonia due to coronavirus disease 2019: Secondary | ICD-10-CM | POA: Diagnosis not present

## 2020-02-24 DIAGNOSIS — J449 Chronic obstructive pulmonary disease, unspecified: Secondary | ICD-10-CM | POA: Diagnosis not present

## 2020-02-24 DIAGNOSIS — I480 Paroxysmal atrial fibrillation: Secondary | ICD-10-CM | POA: Diagnosis not present

## 2020-02-24 DIAGNOSIS — N185 Chronic kidney disease, stage 5: Secondary | ICD-10-CM | POA: Diagnosis not present

## 2020-02-24 DIAGNOSIS — I5022 Chronic systolic (congestive) heart failure: Secondary | ICD-10-CM | POA: Diagnosis not present

## 2020-02-24 DIAGNOSIS — K922 Gastrointestinal hemorrhage, unspecified: Secondary | ICD-10-CM | POA: Diagnosis not present

## 2020-02-24 DIAGNOSIS — R2689 Other abnormalities of gait and mobility: Secondary | ICD-10-CM | POA: Diagnosis not present

## 2020-02-25 ENCOUNTER — Other Ambulatory Visit: Payer: Self-pay | Admitting: *Deleted

## 2020-02-25 ENCOUNTER — Encounter: Payer: Self-pay | Admitting: Cardiovascular Disease

## 2020-02-25 DIAGNOSIS — T8249XD Other complication of vascular dialysis catheter, subsequent encounter: Secondary | ICD-10-CM | POA: Diagnosis not present

## 2020-02-25 DIAGNOSIS — N186 End stage renal disease: Secondary | ICD-10-CM | POA: Diagnosis not present

## 2020-02-25 DIAGNOSIS — N2581 Secondary hyperparathyroidism of renal origin: Secondary | ICD-10-CM | POA: Diagnosis not present

## 2020-02-25 DIAGNOSIS — D689 Coagulation defect, unspecified: Secondary | ICD-10-CM | POA: Diagnosis not present

## 2020-02-25 DIAGNOSIS — Z992 Dependence on renal dialysis: Secondary | ICD-10-CM | POA: Diagnosis not present

## 2020-02-25 DIAGNOSIS — D631 Anemia in chronic kidney disease: Secondary | ICD-10-CM | POA: Diagnosis not present

## 2020-02-25 DIAGNOSIS — D509 Iron deficiency anemia, unspecified: Secondary | ICD-10-CM | POA: Diagnosis not present

## 2020-02-25 NOTE — Patient Outreach (Signed)
[facility]) Care Management  02/25/2020  KHYAN OATS 1940-11-30 [id number]  Telephone outreach. Talked with Mrs. Vassell today and she and myself discussed goals that she felt were appropriate for her husband.  Goals Addressed            This Visit's Progress   . Follow My Treatment Plan-Chronic Kidney as evidenced by no complications reported over the next 30 days.       Timeframe:  Short-Term Goal Priority:  High Start Date:   02/25/20                          Expected End Date:     04/08/20                  Follow Up Date 04/08/20   - call the doctor or nurse to get help with side effects - keep follow-up appointments - keep taking my medicines, even when I feel good - wear medical alert identification at all times    Why is this important?    Staying as healthy as you can is very important. This may mean making changes if you smoke, don't exercise or eat poorly.   A healthy lifestyle is an important goal for you.   Following the treatment plan and making changes may be hard.   Try some of these steps to help keep the disease from getting worse.     Notes: 03/06/20 new to dialysis this month, no problems so far and is reporting feeling better as he is getting used to the txs. Reinforced early outreach for medical advice to avoid complications.    . Manage My Diet as evidenced by stable wt,  no fluid overload (edema) and normal electrolyte panels over the next 3 months.       Timeframe:  Long-Range Goal Priority:  High Start Date:    02/25/20                         Expected End Date:   06/08/20                    Follow Up Date 04/08/20    - choose foods that are low in sodium (salt) - eat 3 to 5 servings of fruits and vegetables each day - keep healthy snacks on hand - meet with dietitian - read food labels for sodium (salt), fat and sugar content - track weight in diary - watch for swelling in feet, ankles and legs every day    Why is this  important?    A healthy diet is important for mental and physical health.   Healthy food helps repair damaged body tissue and maintains strong bones and muscles.   No single food is just right so eating a variety of proteins, fruits, vegetables and grains is best.   You may need to change what you eat or drink to manage kidney disease.   A dietitian is the best person to guide you.     Notes: 02/25/20 Current wt is 172#. Has had dietary consultation and is following recommended diet.    . Track and Manage My Symptoms-Chronic Kidney as evidenced by hemodialysis attendance and no hospitalizations for ESRD over the next 3 months.       Timeframe:  Long-Range Goal Priority:  High Start Date:  02/25/20  Expected End Date:  06/08/20                     Follow Up Date 03/23/20    - balance periods of rest with activity as needed - exercise at least 2 to 3 times per week - get outdoors every day (weather permitting) - pace activity allowing for rest - take medicine as prescribed - track symptoms and feelings in a journal or diary    Why is this important?    Keeping track of symptoms can help you feel the best. It also helps the doctor stay on top of any changes to the disease. It may also help keep your disease from getting worse.   Taking simple steps can help you cope with symptoms like feeling very tired or itchy skin.     Notes: 02/25/20 Sent [facility] Chronic disease management calendar to keep track of appts, sxs, VS.      We agreed to talk again in one week.  [name]. [name], MSN, [facility] Gerontological Nurse Practitioner [facility] Care Management [phone]

## 2020-02-26 ENCOUNTER — Ambulatory Visit: Payer: Medicare Other | Admitting: *Deleted

## 2020-02-26 DIAGNOSIS — N185 Chronic kidney disease, stage 5: Secondary | ICD-10-CM | POA: Diagnosis not present

## 2020-02-26 DIAGNOSIS — U071 COVID-19: Secondary | ICD-10-CM | POA: Diagnosis not present

## 2020-02-26 DIAGNOSIS — J449 Chronic obstructive pulmonary disease, unspecified: Secondary | ICD-10-CM | POA: Diagnosis not present

## 2020-02-26 DIAGNOSIS — I5022 Chronic systolic (congestive) heart failure: Secondary | ICD-10-CM | POA: Diagnosis not present

## 2020-02-26 DIAGNOSIS — K922 Gastrointestinal hemorrhage, unspecified: Secondary | ICD-10-CM | POA: Diagnosis not present

## 2020-02-26 DIAGNOSIS — I480 Paroxysmal atrial fibrillation: Secondary | ICD-10-CM | POA: Diagnosis not present

## 2020-02-26 DIAGNOSIS — D62 Acute posthemorrhagic anemia: Secondary | ICD-10-CM | POA: Diagnosis not present

## 2020-02-26 DIAGNOSIS — R2689 Other abnormalities of gait and mobility: Secondary | ICD-10-CM | POA: Diagnosis not present

## 2020-02-26 DIAGNOSIS — J1282 Pneumonia due to coronavirus disease 2019: Secondary | ICD-10-CM | POA: Diagnosis not present

## 2020-02-26 DIAGNOSIS — I1311 Hypertensive heart and chronic kidney disease without heart failure, with stage 5 chronic kidney disease, or end stage renal disease: Secondary | ICD-10-CM | POA: Diagnosis not present

## 2020-02-27 DIAGNOSIS — D689 Coagulation defect, unspecified: Secondary | ICD-10-CM | POA: Diagnosis not present

## 2020-02-27 DIAGNOSIS — T8249XD Other complication of vascular dialysis catheter, subsequent encounter: Secondary | ICD-10-CM | POA: Diagnosis not present

## 2020-02-27 DIAGNOSIS — D509 Iron deficiency anemia, unspecified: Secondary | ICD-10-CM | POA: Diagnosis not present

## 2020-02-27 DIAGNOSIS — N186 End stage renal disease: Secondary | ICD-10-CM | POA: Diagnosis not present

## 2020-02-27 DIAGNOSIS — Z992 Dependence on renal dialysis: Secondary | ICD-10-CM | POA: Diagnosis not present

## 2020-02-27 DIAGNOSIS — D631 Anemia in chronic kidney disease: Secondary | ICD-10-CM | POA: Diagnosis not present

## 2020-02-27 DIAGNOSIS — N2581 Secondary hyperparathyroidism of renal origin: Secondary | ICD-10-CM | POA: Diagnosis not present

## 2020-03-01 DIAGNOSIS — Z992 Dependence on renal dialysis: Secondary | ICD-10-CM | POA: Diagnosis not present

## 2020-03-01 DIAGNOSIS — N2581 Secondary hyperparathyroidism of renal origin: Secondary | ICD-10-CM | POA: Diagnosis not present

## 2020-03-01 DIAGNOSIS — D689 Coagulation defect, unspecified: Secondary | ICD-10-CM | POA: Diagnosis not present

## 2020-03-01 DIAGNOSIS — T8249XD Other complication of vascular dialysis catheter, subsequent encounter: Secondary | ICD-10-CM | POA: Diagnosis not present

## 2020-03-01 DIAGNOSIS — D509 Iron deficiency anemia, unspecified: Secondary | ICD-10-CM | POA: Diagnosis not present

## 2020-03-01 DIAGNOSIS — N186 End stage renal disease: Secondary | ICD-10-CM | POA: Diagnosis not present

## 2020-03-01 DIAGNOSIS — D631 Anemia in chronic kidney disease: Secondary | ICD-10-CM | POA: Diagnosis not present

## 2020-03-02 DIAGNOSIS — I1311 Hypertensive heart and chronic kidney disease without heart failure, with stage 5 chronic kidney disease, or end stage renal disease: Secondary | ICD-10-CM | POA: Diagnosis not present

## 2020-03-02 DIAGNOSIS — K922 Gastrointestinal hemorrhage, unspecified: Secondary | ICD-10-CM | POA: Diagnosis not present

## 2020-03-02 DIAGNOSIS — J449 Chronic obstructive pulmonary disease, unspecified: Secondary | ICD-10-CM | POA: Diagnosis not present

## 2020-03-02 DIAGNOSIS — I5022 Chronic systolic (congestive) heart failure: Secondary | ICD-10-CM | POA: Diagnosis not present

## 2020-03-02 DIAGNOSIS — R2689 Other abnormalities of gait and mobility: Secondary | ICD-10-CM | POA: Diagnosis not present

## 2020-03-02 DIAGNOSIS — I480 Paroxysmal atrial fibrillation: Secondary | ICD-10-CM | POA: Diagnosis not present

## 2020-03-02 DIAGNOSIS — N185 Chronic kidney disease, stage 5: Secondary | ICD-10-CM | POA: Diagnosis not present

## 2020-03-02 DIAGNOSIS — D62 Acute posthemorrhagic anemia: Secondary | ICD-10-CM | POA: Diagnosis not present

## 2020-03-02 DIAGNOSIS — J1282 Pneumonia due to coronavirus disease 2019: Secondary | ICD-10-CM | POA: Diagnosis not present

## 2020-03-02 DIAGNOSIS — U071 COVID-19: Secondary | ICD-10-CM | POA: Diagnosis not present

## 2020-03-03 ENCOUNTER — Other Ambulatory Visit: Payer: Self-pay | Admitting: *Deleted

## 2020-03-03 DIAGNOSIS — D631 Anemia in chronic kidney disease: Secondary | ICD-10-CM | POA: Diagnosis not present

## 2020-03-03 DIAGNOSIS — Z992 Dependence on renal dialysis: Secondary | ICD-10-CM | POA: Diagnosis not present

## 2020-03-03 DIAGNOSIS — N186 End stage renal disease: Secondary | ICD-10-CM | POA: Diagnosis not present

## 2020-03-03 DIAGNOSIS — T8249XD Other complication of vascular dialysis catheter, subsequent encounter: Secondary | ICD-10-CM | POA: Diagnosis not present

## 2020-03-03 DIAGNOSIS — N2581 Secondary hyperparathyroidism of renal origin: Secondary | ICD-10-CM | POA: Diagnosis not present

## 2020-03-03 DIAGNOSIS — D689 Coagulation defect, unspecified: Secondary | ICD-10-CM | POA: Diagnosis not present

## 2020-03-03 DIAGNOSIS — D509 Iron deficiency anemia, unspecified: Secondary | ICD-10-CM | POA: Diagnosis not present

## 2020-03-03 NOTE — Patient Outreach (Signed)
[facility]) Care Management  03/03/2020  Paul Price 1941-01-02 [id number]  Telephone outreach to follow up on ESKD, Unsuccessful, left message and requested a return call.  I will call again if I do not receive a return on Friday.  [name]. [name], MSN, [facility] Gerontological Nurse Practitioner [facility] Care Management [phone]

## 2020-03-04 DIAGNOSIS — U071 COVID-19: Secondary | ICD-10-CM | POA: Diagnosis not present

## 2020-03-04 DIAGNOSIS — J449 Chronic obstructive pulmonary disease, unspecified: Secondary | ICD-10-CM | POA: Diagnosis not present

## 2020-03-04 DIAGNOSIS — I1311 Hypertensive heart and chronic kidney disease without heart failure, with stage 5 chronic kidney disease, or end stage renal disease: Secondary | ICD-10-CM | POA: Diagnosis not present

## 2020-03-04 DIAGNOSIS — D62 Acute posthemorrhagic anemia: Secondary | ICD-10-CM | POA: Diagnosis not present

## 2020-03-04 DIAGNOSIS — K922 Gastrointestinal hemorrhage, unspecified: Secondary | ICD-10-CM | POA: Diagnosis not present

## 2020-03-04 DIAGNOSIS — R2689 Other abnormalities of gait and mobility: Secondary | ICD-10-CM | POA: Diagnosis not present

## 2020-03-04 DIAGNOSIS — N185 Chronic kidney disease, stage 5: Secondary | ICD-10-CM | POA: Diagnosis not present

## 2020-03-04 DIAGNOSIS — J1282 Pneumonia due to coronavirus disease 2019: Secondary | ICD-10-CM | POA: Diagnosis not present

## 2020-03-04 DIAGNOSIS — I480 Paroxysmal atrial fibrillation: Secondary | ICD-10-CM | POA: Diagnosis not present

## 2020-03-04 DIAGNOSIS — I5022 Chronic systolic (congestive) heart failure: Secondary | ICD-10-CM | POA: Diagnosis not present

## 2020-03-05 ENCOUNTER — Other Ambulatory Visit: Payer: Self-pay | Admitting: Cardiovascular Disease

## 2020-03-05 ENCOUNTER — Other Ambulatory Visit: Payer: Self-pay | Admitting: *Deleted

## 2020-03-05 DIAGNOSIS — Z992 Dependence on renal dialysis: Secondary | ICD-10-CM | POA: Diagnosis not present

## 2020-03-05 DIAGNOSIS — D689 Coagulation defect, unspecified: Secondary | ICD-10-CM | POA: Diagnosis not present

## 2020-03-05 DIAGNOSIS — D631 Anemia in chronic kidney disease: Secondary | ICD-10-CM | POA: Diagnosis not present

## 2020-03-05 DIAGNOSIS — N186 End stage renal disease: Secondary | ICD-10-CM | POA: Diagnosis not present

## 2020-03-05 DIAGNOSIS — N2581 Secondary hyperparathyroidism of renal origin: Secondary | ICD-10-CM | POA: Diagnosis not present

## 2020-03-05 DIAGNOSIS — T8249XD Other complication of vascular dialysis catheter, subsequent encounter: Secondary | ICD-10-CM | POA: Diagnosis not present

## 2020-03-05 DIAGNOSIS — D509 Iron deficiency anemia, unspecified: Secondary | ICD-10-CM | POA: Diagnosis not present

## 2020-03-05 NOTE — Patient Outreach (Signed)
[facility]) Care Management  [place]  03/05/2020   CODYLEE PATIL Mar 18, 1940 [id number]  Subjective: Telephone outreach for transition of care, f/u ESRD  Objective: N/A  Encounter Medications:  Outpatient Encounter Medications as of 03/05/2020  Medication Sig  . acetaminophen (TYLENOL) 500 MG tablet Take 500-1,000 mg by mouth every 6 (six) hours as needed for mild pain, moderate pain or headache.   . allopurinol (ZYLOPRIM) 300 MG tablet Take 300 mg by mouth daily.  Marland Kitchen amLODipine (NORVASC) 5 MG tablet Take 2.5 mg by mouth daily.   Marland Kitchen apixaban (ELIQUIS) 5 MG TABS tablet Take 10mg  (2 tablets) by mouth twice daily on 02/12/20 and 2/4.  Then on 2/5, decrease to 5mg  (1 tablet) by mouth twice daily  . atorvastatin (LIPITOR) 20 MG tablet Take 20 mg by mouth daily.  Marland Kitchen b complex vitamins tablet Take 1 tablet by mouth daily.  Marland Kitchen docusate sodium (COLACE) 100 MG capsule Take 2 capsules (200 mg total) by mouth daily.  . metoprolol succinate (TOPROL-XL) 25 MG 24 hr tablet Take 25 mg by mouth daily.  . pantoprazole (PROTONIX) 40 MG tablet Take 40 mg by mouth daily.  . Tamsulosin HCl (FLOMAX) 0.4 MG CAPS Take 1 capsule (0.4 mg total) by mouth daily.   No facility-administered encounter medications on file as of 03/05/2020.    Functional Status:  In your present state of health, do you have any difficulty performing the following activities: 03/05/2020 02/04/2020  Hearing? N N  Vision? N N  Difficulty concentrating or making decisions? N N  Walking or climbing stairs? Y N  Dressing or bathing? N N  Doing errands, shopping? Y N  Preparing Food and eating ? N -  Using the Toilet? N -  In the past six months, have you accidently leaked urine? N -  Do you have problems with loss of bowel control? N -  Managing your Medications? N -  Managing your Finances? N -  Housekeeping or managing your Housekeeping? Y -  Some recent data might be hidden    Fall/Depression  Screening: Fall Risk  03/05/2020 03/12/2014 02/10/2014  Falls in the past year? 0 No No  Number falls in past yr: 0 - -  Injury with Fall? 0 - -  Risk for fall due to : Impaired balance/gait - -  Risk for fall due to: Comment Due to deconditioning but PT has closed his case. - -  Follow up Falls evaluation completed - -   PHQ 2/9 Scores 03/05/2020 03/12/2014 02/10/2014 12/29/2013  PHQ - 2 Score 2 0 0 0  PHQ- 9 Score 4 - - -    Assessment: Stable ESRD  Goals Addressed            This Visit's Progress   . Follow My Treatment Plan-Chronic Kidney as evidenced by no complications reported over the next 30 days.   On track    Timeframe:  Short-Term Goal Priority:  High Start Date:   02/25/20                          Expected End Date:     04/08/20                  Follow Up Date 04/08/20   Assess how is dialysis going? Any questions? Find out next steps. Any problems and do you know what to do?  Why is this important?    Staying  as healthy as you can is very important. This may mean making changes if you smoke, don't exercise or eat poorly.   A healthy lifestyle is an important goal for you.   Following the treatment plan and making changes may be hard.   Try some of these steps to help keep the disease from getting worse.     Notes: 03/06/20 new to dialysis this month, no problems so far and is reporting feeling better as he is getting used to the txs. Reinforced early outreach for medical advice to avoid complications. 03/05/20 Wife reports dialysis is going well. They are both getting used to the routine. Life is more busy that ever with dialysis, [place] visits and other MD visits. Will have new fistula placed. No problems this week and they know to call the nephrology office if there are problems.    . Manage My Diet as evidenced by stable wt,  no fluid overload (edema) and normal electrolyte panels over the next 3 months.   On track    Timeframe:  Long-Range Goal Priority:  High Start  Date:    02/25/20                         Expected End Date:   06/08/20                    Follow Up Date 04/08/20    - choose foods that are low in sodium (salt) - eat 3 to 5 servings of fruits and vegetables each day - keep healthy snacks on hand - meet with dietitian - read food labels for sodium (salt), fat and sugar content - track weight in diary - watch for swelling in feet, ankles and legs every day    Why is this important?    A healthy diet is important for mental and physical health.   Healthy food helps repair damaged body tissue and maintains strong bones and muscles.   No single food is just right so eating a variety of proteins, fruits, vegetables and grains is best.   You may need to change what you eat or drink to manage kidney disease.   A dietitian is the best person to guide you.     Notes: 02/25/20 Current wt is 172#. Has had dietary consultation and is following recommended diet. 03/05/20 They have been successful in their learning about his new diet restrictions. Mr. And Mrs. Spadafore are invested in making the necessary changes. They have meet with a dietician. They know to read labels choose low salt, fat and sugar contents. They are weighing daily, not change in his wt. Edema is much less prevalent now. Praised for efforts and commitment to new lifestyle changes and monitoring habits!    . COMPLETED: Track and Manage My Symptoms-Chronic Kidney as evidenced by hemodialysis attendance and no hospitalizations for ESRD over the next 3 months.       Timeframe:  Long-Range Goal Priority:  High Start Date:  02/25/20                           Expected End Date:  06/08/20                     Follow Up Date 03/23/20    - balance periods of rest with activity as needed - exercise at least 2 to 3 times per week - get outdoors  every day (weather permitting) - pace activity allowing for rest - take medicine as prescribed - track symptoms and feelings in a journal or diary     Why is this important?    Keeping track of symptoms can help you feel the best. It also helps the doctor stay on top of any changes to the disease. It may also help keep your disease from getting worse.   Taking simple steps can help you cope with symptoms like feeling very tired or itchy skin.     Notes: 02/25/20 Sent [facility] Chronic disease management calendar to keep track of appts, sxs, VS. 03/05/20 Adapting to how he has to plan his day according to activities planned for the day so he does not tire too quickly. He has been participating in PT which is now finished and he knows he needs to keep up doing the exercises daily to maintain his mobility. The weather has been nice and he does enjoy getting outside. He is taking his meds as ordered. He has started using the [facility] Calendar/Log to keep record of wts, VS, appts.      Plan:  Will call again in one week for 4th Transition of care call.  Follow-up:  Patient agrees to Care Plan and Follow-up.   [name]. [name], MSN, [facility] Gerontological Nurse Practitioner [facility] Care Management [phone]

## 2020-03-08 DIAGNOSIS — T8249XD Other complication of vascular dialysis catheter, subsequent encounter: Secondary | ICD-10-CM | POA: Diagnosis not present

## 2020-03-08 DIAGNOSIS — Z992 Dependence on renal dialysis: Secondary | ICD-10-CM | POA: Diagnosis not present

## 2020-03-08 DIAGNOSIS — D689 Coagulation defect, unspecified: Secondary | ICD-10-CM | POA: Diagnosis not present

## 2020-03-08 DIAGNOSIS — I129 Hypertensive chronic kidney disease with stage 1 through stage 4 chronic kidney disease, or unspecified chronic kidney disease: Secondary | ICD-10-CM | POA: Diagnosis not present

## 2020-03-08 DIAGNOSIS — N186 End stage renal disease: Secondary | ICD-10-CM | POA: Diagnosis not present

## 2020-03-08 DIAGNOSIS — N2581 Secondary hyperparathyroidism of renal origin: Secondary | ICD-10-CM | POA: Diagnosis not present

## 2020-03-08 DIAGNOSIS — D509 Iron deficiency anemia, unspecified: Secondary | ICD-10-CM | POA: Diagnosis not present

## 2020-03-08 DIAGNOSIS — D631 Anemia in chronic kidney disease: Secondary | ICD-10-CM | POA: Diagnosis not present

## 2020-03-10 DIAGNOSIS — T8249XD Other complication of vascular dialysis catheter, subsequent encounter: Secondary | ICD-10-CM | POA: Diagnosis not present

## 2020-03-10 DIAGNOSIS — D689 Coagulation defect, unspecified: Secondary | ICD-10-CM | POA: Diagnosis not present

## 2020-03-10 DIAGNOSIS — D631 Anemia in chronic kidney disease: Secondary | ICD-10-CM | POA: Diagnosis not present

## 2020-03-10 DIAGNOSIS — N186 End stage renal disease: Secondary | ICD-10-CM | POA: Diagnosis not present

## 2020-03-10 DIAGNOSIS — D509 Iron deficiency anemia, unspecified: Secondary | ICD-10-CM | POA: Diagnosis not present

## 2020-03-10 DIAGNOSIS — N2581 Secondary hyperparathyroidism of renal origin: Secondary | ICD-10-CM | POA: Diagnosis not present

## 2020-03-10 DIAGNOSIS — Z992 Dependence on renal dialysis: Secondary | ICD-10-CM | POA: Diagnosis not present

## 2020-03-11 DIAGNOSIS — K922 Gastrointestinal hemorrhage, unspecified: Secondary | ICD-10-CM | POA: Diagnosis not present

## 2020-03-11 DIAGNOSIS — N185 Chronic kidney disease, stage 5: Secondary | ICD-10-CM | POA: Diagnosis not present

## 2020-03-11 DIAGNOSIS — R2689 Other abnormalities of gait and mobility: Secondary | ICD-10-CM | POA: Diagnosis not present

## 2020-03-11 DIAGNOSIS — J1282 Pneumonia due to coronavirus disease 2019: Secondary | ICD-10-CM | POA: Diagnosis not present

## 2020-03-11 DIAGNOSIS — I5022 Chronic systolic (congestive) heart failure: Secondary | ICD-10-CM | POA: Diagnosis not present

## 2020-03-11 DIAGNOSIS — U071 COVID-19: Secondary | ICD-10-CM | POA: Diagnosis not present

## 2020-03-11 DIAGNOSIS — I1311 Hypertensive heart and chronic kidney disease without heart failure, with stage 5 chronic kidney disease, or end stage renal disease: Secondary | ICD-10-CM | POA: Diagnosis not present

## 2020-03-11 DIAGNOSIS — D62 Acute posthemorrhagic anemia: Secondary | ICD-10-CM | POA: Diagnosis not present

## 2020-03-11 DIAGNOSIS — J449 Chronic obstructive pulmonary disease, unspecified: Secondary | ICD-10-CM | POA: Diagnosis not present

## 2020-03-11 DIAGNOSIS — I480 Paroxysmal atrial fibrillation: Secondary | ICD-10-CM | POA: Diagnosis not present

## 2020-03-12 ENCOUNTER — Other Ambulatory Visit: Payer: Self-pay | Admitting: *Deleted

## 2020-03-12 DIAGNOSIS — N186 End stage renal disease: Secondary | ICD-10-CM | POA: Diagnosis not present

## 2020-03-12 DIAGNOSIS — N2581 Secondary hyperparathyroidism of renal origin: Secondary | ICD-10-CM | POA: Diagnosis not present

## 2020-03-12 DIAGNOSIS — T8249XD Other complication of vascular dialysis catheter, subsequent encounter: Secondary | ICD-10-CM | POA: Diagnosis not present

## 2020-03-12 DIAGNOSIS — Z992 Dependence on renal dialysis: Secondary | ICD-10-CM | POA: Diagnosis not present

## 2020-03-12 DIAGNOSIS — D631 Anemia in chronic kidney disease: Secondary | ICD-10-CM | POA: Diagnosis not present

## 2020-03-12 DIAGNOSIS — D689 Coagulation defect, unspecified: Secondary | ICD-10-CM | POA: Diagnosis not present

## 2020-03-12 DIAGNOSIS — D509 Iron deficiency anemia, unspecified: Secondary | ICD-10-CM | POA: Diagnosis not present

## 2020-03-12 NOTE — Patient Outreach (Signed)
Paul Price) Care Management  03/12/2020  Paul Price 1940/02/21 [id number]  Transition of care call to follow up on dialysis.  Goals Addressed            This Visit's Progress   . Follow My Treatment Plan-Chronic Kidney as evidenced by no complications reported over the next 30 days.   On track    Timeframe:  Short-Term Goal Priority:  High Start Date:   02/25/20                          Expected End Date:     04/08/20                  Follow Up Date 04/08/20   Assess how is dialysis going? Any questions? Find out next steps. Any problems and do you know what to do?  Why is this important?    Staying as healthy as you can is very important. This may mean making changes if you smoke, don't exercise or eat poorly.   A healthy lifestyle is an important goal for you.   Following the treatment plan and making changes may be hard.   Try some of these steps to help keep the disease from getting worse.     Notes: 03/06/20 new to dialysis this month, no problems so far and is reporting feeling better as he is getting used to the txs. Reinforced early outreach for medical advice to avoid complications. 03/05/20 Wife reports dialysis is going well. They are both getting used to the routine. Life is more busy that ever with dialysis, Paul Price visits and other MD visits. Will have new fistula placed. No problems this week and they know to call the nephrology office if there are problems. 03/12/20 Acceptance of dialysis routine now and improved self care. No questions today. No complications. Reinforced early access to care at the onset of medical problem.    . Manage My Diet as evidenced by stable wt,  no fluid overload (edema) and normal electrolyte panels over the next 3 months.   On track    Timeframe:  Long-Range Goal Priority:  High Start Date:    02/25/20                         Expected End Date:   06/08/20                    Follow Up Date 04/08/20    Assess self care:  diet, activity   Why is this important?    A healthy diet is important for mental and physical health.   Healthy food helps repair damaged body tissue and maintains strong bones and muscles.   No single food is just right so eating a variety of proteins, fruits, vegetables and grains is best.   You may need to change what you eat or drink to manage kidney disease.   A dietitian is the best person to guide you.     Notes: 02/25/20 Current wt is 172#. Has had dietary consultation and is following recommended diet. 03/05/20 They have been successful in their learning about his new diet restrictions. Mr. And Mrs. Paul Price are invested in making the necessary changes. They have meet with a dietician. They know to read labels choose low salt, fat and sugar contents. They are weighing daily, not change in his wt. Edema is much less prevalent  now. Praised for efforts and commitment to new lifestyle changes and monitoring habits! 03/12/20 Wt loss of 12# due to fluid wt loss, initial loss of appetite with initiation of dialysis. Discussed healthy diet which they are working on. Pt is drinking nutritional supplement daily. No edema. Discussed choosing heart healthy choices when eating out.      Agreed to talk again in one week on Thursday for Paul Price participation in the conversation.  Paul Price. Paul Neither, MSN, [facility] Gerontological Nurse Practitioner [facility] Care Management [phone]

## 2020-03-15 DIAGNOSIS — T8249XD Other complication of vascular dialysis catheter, subsequent encounter: Secondary | ICD-10-CM | POA: Diagnosis not present

## 2020-03-15 DIAGNOSIS — Z992 Dependence on renal dialysis: Secondary | ICD-10-CM | POA: Diagnosis not present

## 2020-03-15 DIAGNOSIS — N2581 Secondary hyperparathyroidism of renal origin: Secondary | ICD-10-CM | POA: Diagnosis not present

## 2020-03-15 DIAGNOSIS — N186 End stage renal disease: Secondary | ICD-10-CM | POA: Diagnosis not present

## 2020-03-15 DIAGNOSIS — D631 Anemia in chronic kidney disease: Secondary | ICD-10-CM | POA: Diagnosis not present

## 2020-03-15 DIAGNOSIS — D689 Coagulation defect, unspecified: Secondary | ICD-10-CM | POA: Diagnosis not present

## 2020-03-15 DIAGNOSIS — D509 Iron deficiency anemia, unspecified: Secondary | ICD-10-CM | POA: Diagnosis not present

## 2020-03-16 ENCOUNTER — Encounter: Payer: Self-pay | Admitting: Internal Medicine

## 2020-03-16 ENCOUNTER — Other Ambulatory Visit: Payer: Self-pay

## 2020-03-16 ENCOUNTER — Ambulatory Visit (INDEPENDENT_AMBULATORY_CARE_PROVIDER_SITE_OTHER): Payer: Medicare Other | Admitting: Internal Medicine

## 2020-03-16 VITALS — BP 120/64 | HR 62 | Ht 66.0 in | Wt 164.8 lb

## 2020-03-16 DIAGNOSIS — I441 Atrioventricular block, second degree: Secondary | ICD-10-CM

## 2020-03-16 DIAGNOSIS — R001 Bradycardia, unspecified: Secondary | ICD-10-CM | POA: Diagnosis not present

## 2020-03-16 DIAGNOSIS — N2889 Other specified disorders of kidney and ureter: Secondary | ICD-10-CM | POA: Diagnosis not present

## 2020-03-16 DIAGNOSIS — Z950 Presence of cardiac pacemaker: Secondary | ICD-10-CM

## 2020-03-16 DIAGNOSIS — I5023 Acute on chronic systolic (congestive) heart failure: Secondary | ICD-10-CM | POA: Diagnosis not present

## 2020-03-16 NOTE — Patient Instructions (Signed)
Medication Instructions:  Your physician recommends that you continue on your current medications as directed. Please refer to the Current Medication list given to you today.  *If you need a refill on your cardiac medications before your next appointment, please call your pharmacy*   Lab Work: None ordered.  If you have labs (blood work) drawn today and your tests are completely normal, you will receive your results only by: Marland Kitchen MyChart Message (if you have MyChart) OR . A paper copy in the mail If you have any lab test that is abnormal or we need to change your treatment, we will call you to review the results.   Testing/Procedures: None ordered.    Follow-Up: At [facility], you and your health needs are our priority.  As part of our continuing mission to provide you with exceptional heart care, we have created designated Provider Care Teams.  These Care Teams include your primary Cardiologist (physician) and Advanced Practice Providers (APPs -  Physician Assistants and Nurse Practitioners) who all work together to provide you with the care you need, when you need it.  We recommend signing up for the patient portal called "MyChart".  Sign up information is provided on this After Visit Summary.  MyChart is used to connect with patients for Virtual Visits (Telemedicine).  Patients are able to view lab/test results, encounter notes, upcoming appointments, etc.  Non-urgent messages can be sent to your provider as well.   To learn more about what you can do with MyChart, go to [email or web address].    Your next appointment:   Your next scheduled follow up will be scheduled for 12/2020 with Dr Olin Pia PA

## 2020-03-16 NOTE — Progress Notes (Signed)
Patient Care Team: [name], MD as PCP - General (Internal Medicine) Troy Sine, MD as PCP - Cardiology (Cardiology) Deloria Lair, NP as [place] [facility]   HPI  Paul Price is a 80 y.o. 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     He has a history of ischemic heart disease with prior bypass surgery and PCI-stenting LAD 2012   Paroxysmal atrial fibrillation and is anticoagulated with Xarelto>>Apixoban    Without significant bleeding  The patient denies chest pain,  , nocturnal dyspnea  orthopnea or peripheral edem.  There have been no palpitations, lightheadedness or syncope.  Variable exercise tolerance but has noted no significant improvement since the r return of sinus rhythm in the last few days  Admitted 1/22 with Covid infection and GI bleeding.  Hospital course notable for UTI secondary to staph aureus without bacteremia.  TEE recommended by Dr. Nigel Mormon was negative  Progressive renal insufficiency with AV fistula placement and then renal replacement therapy with hemodialysis started 1/22   DATE TEST EF   4/18 MYOVIEW  47 % Small scar w/o ischemia   12/18 Echo   40-45% % LVH mod  12/21 Echo  40-45%   1/22 TEE 40-45% No evidence of infection               Date Cr K Hgb  12/18 2.26  11.2  11/19 1.85 4.2 10.7  9/20 2.47 4.5 12.1  2/22 3.58 4.2 8.7     Records and Results Reviewed   Past Medical History:  Diagnosis Date  . Arthritis    "shoulders" (09/07/2016)  . Atrial fibrillation ([place])   . BPH (benign prostatic hyperplasia)   . Chronic kidney disease (CKD), stage III (moderate) (HCC)   . Coronary artery disease   . DDD (degenerative disc disease), cervical   . DVT (deep venous thrombosis) ([place]) 12/2013   LLE  . GERD (gastroesophageal reflux disease)   . Gout   . High cholesterol   . History of blood transfusion 09/06/2016   2 u PRBC  .  History of scarlet fever 1940s  . History of stress test 06/2011   No significant ischemia, this is a low risk scan. Clinical correlation recommended Abnormal myocardial perfusion study.  Marland Kitchen Hx of echocardiogram 05/2009   EF 40-45%, he did have mild annular calcification with mild-to-moderate MR and mild TR as well as aortic valve sclerosis. Estimated RV systolic pressure was 21 mm.  . Hypertension   . Iron deficiency anemia   . Kidney failure   . 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 Neck pain    "not chronic" (09/07/2016)  . Obesity   . OSA on CPAP   . RBBB   . Renal insufficiency   . Right renal mass   . Spinal stenosis of lumbar region     Past Surgical History:  Procedure Laterality Date  . APPENDECTOMY    . AV FISTULA PLACEMENT Right 02/09/2020   Procedure: ARTERIOVENOUS (AV) FISTULA  CREATION RIGHT UPPER EXTREMITY;  Surgeon: Marty Heck, MD;  Location: [place];  Service: Vascular;  Laterality: Right;  . CARDIAC CATHETERIZATION  2009   he was found to have a atretic LIMA graft to his LAD and had a patent vein graft supplying his diagonal vessel. At that time, he had 70% narrowing in his LAD beyond a diagonal vessel which was initially treated  medically.  . COLONOSCOPY WITH PROPOFOL Left 09/09/2016   Procedure: COLONOSCOPY WITH PROPOFOL;  Surgeon: Arta Silence, MD;  Location: [place];  Service: Endoscopy;  Laterality: Left;  . CORONARY ANGIOPLASTY WITH STENT PLACEMENT  April 2012   LAD DES  . CORONARY ARTERY BYPASS GRAFT  1992   with LIMA to the LAD and diagonal.  . CYSTOSCOPY N/A 03/31/2019   Procedure: CYSTOSCOPY FLEXIBLE;  Surgeon: Lucas Mallow, MD;  Location: [facility];  Service: Urology;  Laterality: N/A;  . CYSTOSCOPY WITH URETHRAL DILATATION N/A 05/21/2019   Procedure: CYSTOSCOPY WITH URETHRAL BALLOON DILATATION;  Surgeon: Lucas Mallow, MD;  Location: [facility];  Service: Urology;  Laterality: N/A;  . ESOPHAGOGASTRODUODENOSCOPY N/A 11/16/2019    Procedure: ESOPHAGOGASTRODUODENOSCOPY (EGD);  Surgeon: Wilford Corner, MD;  Location: [place];  Service: Endoscopy;  Laterality: N/A;  . ESOPHAGOGASTRODUODENOSCOPY (EGD) WITH PROPOFOL Left 09/08/2016   Procedure: ESOPHAGOGASTRODUODENOSCOPY (EGD) WITH PROPOFOL;  Surgeon: Ronnette Juniper, MD;  Location: [place];  Service: Gastroenterology;  Laterality: Left;  . EXCHANGE OF A DIALYSIS CATHETER Right 02/09/2020   Procedure: EXCHANGE OF A DIALYSIS CATHETER;  Surgeon: Marty Heck, MD;  Location: [place];  Service: Vascular;  Laterality: Right;  . GIVENS CAPSULE STUDY N/A 11/16/2019   Procedure: GIVENS CAPSULE STUDY;  Surgeon: Wilford Corner, MD;  Location: [facility];  Service: Endoscopy;  Laterality: N/A;  . INGUINAL HERNIA REPAIR     "don't remeimber which side"  . IR FLUORO GUIDE CV LINE RIGHT  02/05/2020  . IR US GUIDE VASC ACCESS RIGHT  02/05/2020  . LAPAROSCOPIC NEPHRECTOMY Right 03/31/2019   Procedure: RIGHT  HAND ASSIST LAPAROSCOPIC NEPHRECTOMY;  Surgeon: Lucas Mallow, MD;  Location: [facility];  Service: Urology;  Laterality: Right;  . PACEMAKER IMPLANT N/A 12/25/2016   Procedure: PACEMAKER IMPLANT;  Surgeon: Deboraha Sprang, MD;  Location: [place] [facility];  Service: Cardiovascular;  Laterality: N/A;  . TEE WITHOUT CARDIOVERSION N/A 02/06/2020   Procedure: TRANSESOPHAGEAL ECHOCARDIOGRAM (TEE);  Surgeon: Skeet Latch, MD;  Location: [place];  Service: Cardiovascular;  Laterality: N/A;  . TONSILLECTOMY      Current Outpatient Medications  Medication Sig Dispense Refill  . acetaminophen (TYLENOL) 500 MG tablet Take 500-1,000 mg by mouth every 6 (six) hours as needed for mild pain, moderate pain or headache.     . allopurinol (ZYLOPRIM) 300 MG tablet Take 300 mg by mouth daily.    Marland Kitchen amLODipine (NORVASC) 5 MG tablet TAKE 1 AND 1/2 TABLETS BY MOUTH ONCE DAILY 135 tablet 0  . apixaban (ELIQUIS) 5 MG TABS tablet Take 5 mg by mouth 2 (two) times daily.    Marland Kitchen  atorvastatin (LIPITOR) 20 MG tablet Take 20 mg by mouth daily.    Marland Kitchen b complex vitamins tablet Take 1 tablet by mouth daily.    Marland Kitchen docusate sodium (COLACE) 100 MG capsule Take 2 capsules (200 mg total) by mouth daily. 30 capsule 0  . ferrous sulfate 325 (65 FE) MG tablet Take by mouth as needed. During dialysis    . Methoxy PEG-Epoetin Beta (MIRCERA IJ) as needed (during diaysis).    . metoprolol succinate (TOPROL-XL) 25 MG 24 hr tablet Take 25 mg by mouth daily.    . pantoprazole (PROTONIX) 40 MG tablet Take 40 mg by mouth daily.    . Tamsulosin HCl (FLOMAX) 0.4 MG CAPS Take 1 capsule (0.4 mg total) by mouth daily. 30 capsule 0   No current facility-administered medications for this visit.  Allergies  Allergen Reactions  . Simvastatin Other (See Comments)    Abdominal cramps  . Nitrostat [Nitroglycerin] Other (See Comments)    Causes blood pressure to "bottom out"      Review of Systems negative except from HPI and PMH  Physical Exam BP 120/64   Pulse 62   Ht 5\' 6"  (1.676 m)   Wt 164 lb 12.8 oz (74.8 kg)   SpO2 99%   BMI 26.60 kg/m  Well developed and well nourished in no acute distress HENT normal Neck supple with JVP-flat Clear Device pocket well healed; without hematoma or erythema.  There is no tethering  Regular rate and rhythm, 2/6* murmur Abd-soft with active BS No Clubbing cyanosis tr  edema Skin-warm and dry A & Oriented  Grossly normal sensory and motor function  ECG AV pacing at 62 Normal 18/16/49 QRS upright lead I and QR in lead V1  Assessment and  Plan   Sinus exit block with symptomatic bradycardia  Complete heart block  Pacemaker Medtronic The patient's device was interrogated.  The information was reviewed. No changes were made in the programming.      Ischemic heart disease with prior bypass ejection fraction 40-45%  Hypertension  Syncope  Heart failure -- chronic systolic/diastolic  Renal Insufficiency  Gd  4   Interval atrial  fibrillation.  Self terminating.  No clearly change in symptoms.  So not sure will be worth pursuing sinus rhythm.  We will have to keep our eye on when he reverts him again to atrial fibrillation; however, given his complete heart block it would be very difficult to discern when that is except by device interrogation.  Continue to monitor LV dysfunction with chronic pacing    Without symptoms of ischemia  Euvolemic continue current meds

## 2020-03-17 DIAGNOSIS — D689 Coagulation defect, unspecified: Secondary | ICD-10-CM | POA: Diagnosis not present

## 2020-03-17 DIAGNOSIS — N186 End stage renal disease: Secondary | ICD-10-CM | POA: Diagnosis not present

## 2020-03-17 DIAGNOSIS — D509 Iron deficiency anemia, unspecified: Secondary | ICD-10-CM | POA: Diagnosis not present

## 2020-03-17 DIAGNOSIS — Z992 Dependence on renal dialysis: Secondary | ICD-10-CM | POA: Diagnosis not present

## 2020-03-17 DIAGNOSIS — D631 Anemia in chronic kidney disease: Secondary | ICD-10-CM | POA: Diagnosis not present

## 2020-03-17 DIAGNOSIS — T8249XD Other complication of vascular dialysis catheter, subsequent encounter: Secondary | ICD-10-CM | POA: Diagnosis not present

## 2020-03-17 DIAGNOSIS — N2581 Secondary hyperparathyroidism of renal origin: Secondary | ICD-10-CM | POA: Diagnosis not present

## 2020-03-18 ENCOUNTER — Other Ambulatory Visit: Payer: Self-pay | Admitting: *Deleted

## 2020-03-18 ENCOUNTER — Other Ambulatory Visit: Payer: Self-pay

## 2020-03-18 DIAGNOSIS — I480 Paroxysmal atrial fibrillation: Secondary | ICD-10-CM | POA: Diagnosis not present

## 2020-03-18 DIAGNOSIS — D62 Acute posthemorrhagic anemia: Secondary | ICD-10-CM | POA: Diagnosis not present

## 2020-03-18 DIAGNOSIS — R2689 Other abnormalities of gait and mobility: Secondary | ICD-10-CM | POA: Diagnosis not present

## 2020-03-18 DIAGNOSIS — U071 COVID-19: Secondary | ICD-10-CM | POA: Diagnosis not present

## 2020-03-18 DIAGNOSIS — N185 Chronic kidney disease, stage 5: Secondary | ICD-10-CM | POA: Diagnosis not present

## 2020-03-18 DIAGNOSIS — I1311 Hypertensive heart and chronic kidney disease without heart failure, with stage 5 chronic kidney disease, or end stage renal disease: Secondary | ICD-10-CM | POA: Diagnosis not present

## 2020-03-18 DIAGNOSIS — I5022 Chronic systolic (congestive) heart failure: Secondary | ICD-10-CM | POA: Diagnosis not present

## 2020-03-18 DIAGNOSIS — K922 Gastrointestinal hemorrhage, unspecified: Secondary | ICD-10-CM | POA: Diagnosis not present

## 2020-03-18 DIAGNOSIS — J449 Chronic obstructive pulmonary disease, unspecified: Secondary | ICD-10-CM | POA: Diagnosis not present

## 2020-03-18 DIAGNOSIS — J1282 Pneumonia due to coronavirus disease 2019: Secondary | ICD-10-CM | POA: Diagnosis not present

## 2020-03-18 NOTE — Patient Outreach (Signed)
[facility]) Care Management  [place]  03/18/2020   Paul Price 1940/09/27 [id number]  Subjective: Telephone follow up on ESRD and Dialysis.  CALL WAS UNSUCCESSFUL.   Pt goes to dialysis on Mondays, Wednesdays and Fridays.     Encounter Medications:  Outpatient Encounter Medications as of 03/18/2020  Medication Sig  . acetaminophen (TYLENOL) 500 MG tablet Take 500-1,000 mg by mouth every 6 (six) hours as needed for mild pain, moderate pain or headache.   . allopurinol (ZYLOPRIM) 300 MG tablet Take 300 mg by mouth daily.  Marland Kitchen amLODipine (NORVASC) 5 MG tablet TAKE 1 AND 1/2 TABLETS BY MOUTH ONCE DAILY  . apixaban (ELIQUIS) 5 MG TABS tablet Take 5 mg by mouth 2 (two) times daily.  Marland Kitchen atorvastatin (LIPITOR) 20 MG tablet Take 20 mg by mouth daily.  Marland Kitchen b complex vitamins tablet Take 1 tablet by mouth daily.  Marland Kitchen docusate sodium (COLACE) 100 MG capsule Take 2 capsules (200 mg total) by mouth daily.  . ferrous sulfate 325 (65 FE) MG tablet Take by mouth as needed. During dialysis  . Methoxy PEG-Epoetin Beta (MIRCERA IJ) as needed (during diaysis).  . metoprolol succinate (TOPROL-XL) 25 MG 24 hr tablet Take 25 mg by mouth daily.  . pantoprazole (PROTONIX) 40 MG tablet Take 40 mg by mouth daily.  . Tamsulosin HCl (FLOMAX) 0.4 MG CAPS Take 1 capsule (0.4 mg total) by mouth daily.   No facility-administered encounter medications on file as of 03/18/2020.    Assessment:  ESRD on dialysis   Goals Addressed   None     Plan:  We agreed to a follow up phone call in one month.  Follow-up:  Patient agrees to Care Plan and Follow-up.   [name]. [name], MSN, [facility] Gerontological Nurse Practitioner [facility] Care Management [phone]

## 2020-03-19 ENCOUNTER — Other Ambulatory Visit: Payer: Self-pay | Admitting: *Deleted

## 2020-03-19 DIAGNOSIS — N186 End stage renal disease: Secondary | ICD-10-CM

## 2020-03-19 DIAGNOSIS — Z992 Dependence on renal dialysis: Secondary | ICD-10-CM | POA: Diagnosis not present

## 2020-03-19 DIAGNOSIS — N2581 Secondary hyperparathyroidism of renal origin: Secondary | ICD-10-CM | POA: Diagnosis not present

## 2020-03-19 DIAGNOSIS — D631 Anemia in chronic kidney disease: Secondary | ICD-10-CM | POA: Diagnosis not present

## 2020-03-19 DIAGNOSIS — T8249XD Other complication of vascular dialysis catheter, subsequent encounter: Secondary | ICD-10-CM | POA: Diagnosis not present

## 2020-03-19 DIAGNOSIS — D509 Iron deficiency anemia, unspecified: Secondary | ICD-10-CM | POA: Diagnosis not present

## 2020-03-19 DIAGNOSIS — D689 Coagulation defect, unspecified: Secondary | ICD-10-CM | POA: Diagnosis not present

## 2020-03-22 DIAGNOSIS — T8249XD Other complication of vascular dialysis catheter, subsequent encounter: Secondary | ICD-10-CM | POA: Diagnosis not present

## 2020-03-22 DIAGNOSIS — Z992 Dependence on renal dialysis: Secondary | ICD-10-CM | POA: Diagnosis not present

## 2020-03-22 DIAGNOSIS — N2581 Secondary hyperparathyroidism of renal origin: Secondary | ICD-10-CM | POA: Diagnosis not present

## 2020-03-22 DIAGNOSIS — N186 End stage renal disease: Secondary | ICD-10-CM | POA: Diagnosis not present

## 2020-03-22 DIAGNOSIS — D509 Iron deficiency anemia, unspecified: Secondary | ICD-10-CM | POA: Diagnosis not present

## 2020-03-22 DIAGNOSIS — D631 Anemia in chronic kidney disease: Secondary | ICD-10-CM | POA: Diagnosis not present

## 2020-03-22 DIAGNOSIS — D689 Coagulation defect, unspecified: Secondary | ICD-10-CM | POA: Diagnosis not present

## 2020-03-23 ENCOUNTER — Other Ambulatory Visit: Payer: Self-pay

## 2020-03-23 ENCOUNTER — Ambulatory Visit (INDEPENDENT_AMBULATORY_CARE_PROVIDER_SITE_OTHER): Payer: Medicare Other | Admitting: Physician Assistant

## 2020-03-23 ENCOUNTER — Ambulatory Visit (HOSPITAL_COMMUNITY)
Admission: RE | Admit: 2020-03-23 | Discharge: 2020-03-23 | Disposition: A | Discharge status: Home / Self Care | Payer: Medicare Other | Source: Ambulatory Visit | Attending: Vascular Surgery | Admitting: Vascular Surgery

## 2020-03-23 VITALS — BP 121/49 | HR 63 | Temp 98.3°F | Resp 20 | Ht 66.0 in | Wt 164.9 lb

## 2020-03-23 DIAGNOSIS — N186 End stage renal disease: Secondary | ICD-10-CM | POA: Insufficient documentation

## 2020-03-23 NOTE — H&P (View-Only) (Signed)
POST OPERATIVE OFFICE NOTE    CC:  F/u for surgery  HPI:  This is a 80 y.o. male who is s/p right first stage basilic av fistula on 8/76/81 by Dr. Carlis Abbott.    Pt returns today for follow up.  Pt states he has slight weakness and numbness in the right hand, but it is tolerable.  He is on HD via right [facility] MWF.  He denise fever and chills.  Allergies  Allergen Reactions  . Simvastatin Other (See Comments)    Abdominal cramps  . Nitrostat [Nitroglycerin] Other (See Comments)    Causes blood pressure to "bottom out"    Current Outpatient Medications  Medication Sig Dispense Refill  . acetaminophen (TYLENOL) 500 MG tablet Take 500-1,000 mg by mouth every 6 (six) hours as needed for mild pain, moderate pain or headache.     . allopurinol (ZYLOPRIM) 300 MG tablet Take 300 mg by mouth daily.    Marland Kitchen amLODipine (NORVASC) 5 MG tablet TAKE 1 AND 1/2 TABLETS BY MOUTH ONCE DAILY 135 tablet 0  . apixaban (ELIQUIS) 5 MG TABS tablet Take 5 mg by mouth 2 (two) times daily.    Marland Kitchen atorvastatin (LIPITOR) 20 MG tablet Take 20 mg by mouth daily.    Marland Kitchen b complex vitamins tablet Take 1 tablet by mouth daily.    Marland Kitchen docusate sodium (COLACE) 100 MG capsule Take 2 capsules (200 mg total) by mouth daily. 30 capsule 0  . ferrous sulfate 325 (65 FE) MG tablet Take by mouth as needed. During dialysis    . Methoxy PEG-Epoetin Beta (MIRCERA IJ) as needed (during diaysis).    . metoprolol succinate (TOPROL-XL) 25 MG 24 hr tablet Take 25 mg by mouth daily.    . pantoprazole (PROTONIX) 40 MG tablet Take 40 mg by mouth daily.    . Tamsulosin HCl (FLOMAX) 0.4 MG CAPS Take 1 capsule (0.4 mg total) by mouth daily. 30 capsule 0   No current facility-administered medications for this visit.     ROS:  See HPI  Physical Exam:  Findings:  +--------------------+----------+-----------------+--------+  AVF         PSV (cm/s)Flow Vol (mL/min)Comments  +--------------------+----------+-----------------+--------+   Native artery inflow  199      538          +--------------------+----------+-----------------+--------+  AVF Anastomosis     428                 +--------------------+----------+-----------------+--------+     +------------+----------+------------+-----------+-------------------------  ----+  OUTFLOW VEINPSV (cm/s) Diameter Depth (cm)      Describe                     (cm)                         +------------+----------+------------+-----------+-------------------------  ----+  Mid UA     110    0.71    0.85   competing branch 0.29  154                                cm/s         +------------+----------+------------+-----------+-------------------------  ----+  Dist UA     463    0.66    0.48                   +------------+----------+------------+-----------+-------------------------  ----+  AC Fossa  405 / 798 0.59 /  0.14 0.78 / 0.57   1.24 cm in length      +------------+----------+------------+-----------+-------------------------  ----+   Incision:  Well healed right AC incision Extremities:  Palpable thrill, radial pulse and grip 4+/5 right UE    Assessment/Plan:  This is a 80 y.o. male who is s/p: first stage basilic  right UE.  Well matured right bsilic av fistula.  It is to deep to access.   He will now be scheduled for second stage transposition with Dr. Carlis Abbott.  He is on Eliquis with Afib and history of dVT.  HD MWF.     Roxy Horseman PA-C [facility] [phone]   Clinic MD:  Carlis Abbott

## 2020-03-23 NOTE — Progress Notes (Signed)
POST OPERATIVE OFFICE NOTE    CC:  F/u for surgery  HPI:  This is a 80 y.o. male who is s/p right first stage basilic av fistula on 07/01/27 by Dr. Carlis Abbott.    Pt returns today for follow up.  Pt states he has slight weakness and numbness in the right hand, but it is tolerable.  He is on HD via right [facility] MWF.  He denise fever and chills.  Allergies  Allergen Reactions  . Simvastatin Other (See Comments)    Abdominal cramps  . Nitrostat [Nitroglycerin] Other (See Comments)    Causes blood pressure to "bottom out"    Current Outpatient Medications  Medication Sig Dispense Refill  . acetaminophen (TYLENOL) 500 MG tablet Take 500-1,000 mg by mouth every 6 (six) hours as needed for mild pain, moderate pain or headache.     . allopurinol (ZYLOPRIM) 300 MG tablet Take 300 mg by mouth daily.    Marland Kitchen amLODipine (NORVASC) 5 MG tablet TAKE 1 AND 1/2 TABLETS BY MOUTH ONCE DAILY 135 tablet 0  . apixaban (ELIQUIS) 5 MG TABS tablet Take 5 mg by mouth 2 (two) times daily.    Marland Kitchen atorvastatin (LIPITOR) 20 MG tablet Take 20 mg by mouth daily.    Marland Kitchen b complex vitamins tablet Take 1 tablet by mouth daily.    Marland Kitchen docusate sodium (COLACE) 100 MG capsule Take 2 capsules (200 mg total) by mouth daily. 30 capsule 0  . ferrous sulfate 325 (65 FE) MG tablet Take by mouth as needed. During dialysis    . Methoxy PEG-Epoetin Beta (MIRCERA IJ) as needed (during diaysis).    . metoprolol succinate (TOPROL-XL) 25 MG 24 hr tablet Take 25 mg by mouth daily.    . pantoprazole (PROTONIX) 40 MG tablet Take 40 mg by mouth daily.    . Tamsulosin HCl (FLOMAX) 0.4 MG CAPS Take 1 capsule (0.4 mg total) by mouth daily. 30 capsule 0   No current facility-administered medications for this visit.     ROS:  See HPI  Physical Exam:  Findings:  +--------------------+----------+-----------------+--------+  AVF         PSV (cm/s)Flow Vol (mL/min)Comments  +--------------------+----------+-----------------+--------+   Native artery inflow  199      538          +--------------------+----------+-----------------+--------+  AVF Anastomosis     428                 +--------------------+----------+-----------------+--------+     +------------+----------+------------+-----------+-------------------------  ----+  OUTFLOW VEINPSV (cm/s) Diameter Depth (cm)      Describe                     (cm)                         +------------+----------+------------+-----------+-------------------------  ----+  Mid UA     110    0.71    0.85   competing branch 0.29  154                                cm/s         +------------+----------+------------+-----------+-------------------------  ----+  Dist UA     463    0.66    0.48                   +------------+----------+------------+-----------+-------------------------  ----+  AC Fossa  405 / 798 0.59 /  0.14 0.78 / 0.57   1.24 cm in length      +------------+----------+------------+-----------+-------------------------  ----+   Incision:  Well healed right AC incision Extremities:  Palpable thrill, radial pulse and grip 4+/5 right UE    Assessment/Plan:  This is a 80 y.o. male who is s/p: first stage basilic  right UE.  Well matured right bsilic av fistula.  It is to deep to access.   He will now be scheduled for second stage transposition with Dr. Carlis Abbott.  He is on Eliquis with Afib and history of dVT.  HD MWF.     Roxy Horseman PA-C [facility] [phone]   Clinic MD:  Carlis Abbott

## 2020-03-24 ENCOUNTER — Other Ambulatory Visit: Payer: Self-pay | Admitting: *Deleted

## 2020-03-24 DIAGNOSIS — D689 Coagulation defect, unspecified: Secondary | ICD-10-CM | POA: Diagnosis not present

## 2020-03-24 DIAGNOSIS — N2581 Secondary hyperparathyroidism of renal origin: Secondary | ICD-10-CM | POA: Diagnosis not present

## 2020-03-24 DIAGNOSIS — Z992 Dependence on renal dialysis: Secondary | ICD-10-CM | POA: Diagnosis not present

## 2020-03-24 DIAGNOSIS — D631 Anemia in chronic kidney disease: Secondary | ICD-10-CM | POA: Diagnosis not present

## 2020-03-24 DIAGNOSIS — D509 Iron deficiency anemia, unspecified: Secondary | ICD-10-CM | POA: Diagnosis not present

## 2020-03-24 DIAGNOSIS — T8249XD Other complication of vascular dialysis catheter, subsequent encounter: Secondary | ICD-10-CM | POA: Diagnosis not present

## 2020-03-24 DIAGNOSIS — N186 End stage renal disease: Secondary | ICD-10-CM | POA: Diagnosis not present

## 2020-03-24 LAB — CUP PACEART INCLINIC DEVICE CHECK
Date Time Interrogation Session: 20220308134500
Implantable Lead Implant Date: 20181217
Implantable Lead Implant Date: 20181217
Implantable Lead Location: 753859
Implantable Lead Location: 753859
Implantable Lead Model: 3830
Implantable Lead Model: 5076
Implantable Pulse Generator Implant Date: 20181217

## 2020-03-24 NOTE — Patient Outreach (Signed)
[name]) Care Management  [name]  03/24/2020   Paul Price 06/09/1940 [id number]  Subjective: Telephone outreach to follow up on ESRD, dialysis tx. Pt was only able to speak a few minutes and said he was tolerating the dialysis well and would have his new access placement next week.  Objective: N/A  Encounter Medications:  Outpatient Encounter Medications as of 03/24/2020  Medication Sig  . acetaminophen (TYLENOL) 500 MG tablet Take 500-1,000 mg by mouth every 6 (six) hours as needed for mild pain, moderate pain or headache.   . allopurinol (ZYLOPRIM) 300 MG tablet Take 300 mg by mouth daily.  Marland Kitchen amLODipine (NORVASC) 5 MG tablet TAKE 1 AND 1/2 TABLETS BY MOUTH ONCE DAILY  . apixaban (ELIQUIS) 5 MG TABS tablet Take 5 mg by mouth 2 (two) times daily.  Marland Kitchen atorvastatin (LIPITOR) 20 MG tablet Take 20 mg by mouth daily.  Marland Kitchen b complex vitamins tablet Take 1 tablet by mouth daily.  Marland Kitchen docusate sodium (COLACE) 100 MG capsule Take 2 capsules (200 mg total) by mouth daily.  . ferrous sulfate 325 (65 FE) MG tablet Take by mouth as needed. During dialysis  . Methoxy PEG-Epoetin Beta (MIRCERA IJ) as needed (during diaysis).  . metoprolol succinate (TOPROL-XL) 25 MG 24 hr tablet Take 25 mg by mouth daily.  . pantoprazole (PROTONIX) 40 MG tablet Take 40 mg by mouth daily.  . Tamsulosin HCl (FLOMAX) 0.4 MG CAPS Take 1 capsule (0.4 mg total) by mouth daily.   No facility-administered encounter medications on file as of 03/24/2020.    Assessment: ESRD on dialysis  Goals Addressed   None     Plan: We agreed to talk again in 2 weeks.  Follow-up:  Patient agrees to Care Plan and Follow-up.   [name]. [name], MSN, [facility] Gerontological Nurse Practitioner [facility] Care Management [phone]

## 2020-03-26 DIAGNOSIS — T8249XD Other complication of vascular dialysis catheter, subsequent encounter: Secondary | ICD-10-CM | POA: Diagnosis not present

## 2020-03-26 DIAGNOSIS — D631 Anemia in chronic kidney disease: Secondary | ICD-10-CM | POA: Diagnosis not present

## 2020-03-26 DIAGNOSIS — D509 Iron deficiency anemia, unspecified: Secondary | ICD-10-CM | POA: Diagnosis not present

## 2020-03-26 DIAGNOSIS — N186 End stage renal disease: Secondary | ICD-10-CM | POA: Diagnosis not present

## 2020-03-26 DIAGNOSIS — N2581 Secondary hyperparathyroidism of renal origin: Secondary | ICD-10-CM | POA: Diagnosis not present

## 2020-03-26 DIAGNOSIS — D689 Coagulation defect, unspecified: Secondary | ICD-10-CM | POA: Diagnosis not present

## 2020-03-26 DIAGNOSIS — Z992 Dependence on renal dialysis: Secondary | ICD-10-CM | POA: Diagnosis not present

## 2020-03-27 ENCOUNTER — Encounter (HOSPITAL_COMMUNITY): Payer: Self-pay | Admitting: Vascular Surgery

## 2020-03-27 NOTE — Progress Notes (Signed)
Gave pt. instructions on up coming surgery. Denies chest pain or shortness of breath. Pt. Has a pacemaker,cardiologist, Dr. Claiborne Billings. Message sent to device clinic for pacemaker, and email sent to medtronic rep. And Lindsi Forte, R.N. of up coming surgery

## 2020-03-29 ENCOUNTER — Other Ambulatory Visit (HOSPITAL_COMMUNITY): Payer: Medicare Other

## 2020-03-29 ENCOUNTER — Encounter: Payer: Self-pay | Admitting: Internal Medicine

## 2020-03-29 DIAGNOSIS — T8249XD Other complication of vascular dialysis catheter, subsequent encounter: Secondary | ICD-10-CM | POA: Diagnosis not present

## 2020-03-29 DIAGNOSIS — N186 End stage renal disease: Secondary | ICD-10-CM | POA: Diagnosis not present

## 2020-03-29 DIAGNOSIS — D689 Coagulation defect, unspecified: Secondary | ICD-10-CM | POA: Diagnosis not present

## 2020-03-29 DIAGNOSIS — N2581 Secondary hyperparathyroidism of renal origin: Secondary | ICD-10-CM | POA: Diagnosis not present

## 2020-03-29 DIAGNOSIS — D631 Anemia in chronic kidney disease: Secondary | ICD-10-CM | POA: Diagnosis not present

## 2020-03-29 DIAGNOSIS — Z992 Dependence on renal dialysis: Secondary | ICD-10-CM | POA: Diagnosis not present

## 2020-03-29 DIAGNOSIS — D509 Iron deficiency anemia, unspecified: Secondary | ICD-10-CM | POA: Diagnosis not present

## 2020-03-29 DIAGNOSIS — Z23 Encounter for immunization: Secondary | ICD-10-CM | POA: Diagnosis not present

## 2020-03-29 NOTE — Progress Notes (Addendum)
Anesthesia Chart Review:  Case: [id number] Date/Time: 03/30/20 0901   Procedure: RIGHT SECOND STAGE BASCILIC VEIN TRANSPOSITION (Right )   Anesthesia type: Choice   Pre-op diagnosis: ESRD   Location: [facility] OR ROOM 12 / [place] OR   Surgeons: Waynetta Sandy, MD      DISCUSSION: Patient is an 80 year old male scheduled for the above procedure. He is s/p first stage basilic vein transposition 02/09/20.   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  Recent primary cardiology and EP cardiology visits in 02/2020 following hospitalizations. Awaiting perioperative PPM recommendations from [facility]. He denied chest pain and SOB per PAT RN phone interview. Per VVS instructions, hold Eliquis for 3 days prior to surgery. Last dose 03/27/20.   Anesthesia team to evaluate on the day of surgery.   VS:  BP Readings from Last 3 Encounters:  03/23/20 (!) 121/49  03/16/20 120/64  02/19/20 122/72   Pulse Readings from Last 3 Encounters:  03/23/20 63  03/16/20 62  02/19/20 81    PROVIDERS: [name], MD is PCP  Shelva Majestic, MD is cardiologist. Last visit 02/19/20 with six month follow-up planned. Virl Axe, MD is EP. Last visit 03/16/20.  Link Snuffer, MD is urologist Donato Heinz, MD is nephrologist   LABS: For day of surgery. As of 02/12/20, Cr  3.58, glucose 93, H/H 8.7/27.9, PLT 141.   IMAGES: PCXR 02/09/20: FINDINGS: - Cardiomegaly with mild pulmonary vascular congestion. Mild patchy opacity in the lingula and left lower lobe, likely atelectasis versus mild perihilar edema. Suspected small bilateral pleural effusions. No pneumothorax. - 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 - Right chest dual lumen dialysis catheter terminating in the upper right atrium. - Median sternotomy. IMPRESSION: - Cardiomegaly with mild pulmonary vascular congestion and small bilateral pleural effusions. - Mild patchy opacity in the lingula and left lower lobe, likely atelectasis versus mild perihilar edema. - Right chest dual lumen dialysis catheter terminating in the upper right atrium.   EKG: 03/16/20: Paced rhythm.   CV: TEE 02/06/20 IMPRESSIONS  1. Global hypokinesis worse in the septum. Left ventricular ejection  fraction, by estimation, is 40 to 45%. The left ventricle has mildly  decreased function. The left ventricle demonstrates global hypokinesis.  Left ventricular diastolic function could  not be evaluated.  2. Right ventricular systolic function is mildly reduced. The right  ventricular size is normal.  3. Thrombus in the left atrial appendage measuring 1.5 x 0.6 cm. Left  atrial size was mildly dilated. A left atrial/left atrial appendage  thrombus was detected. The LAA emptying velocity was 47 cm/s.  4. Pacemaker wire.  5. The mitral valve is normal in structure. Mild mitral valve  regurgitation. No evidence of mitral stenosis.  6. Tricuspid valve regurgitation is mild to moderate.  7. The aortic valve is tricuspid. Aortic valve regurgitation is not  visualized. No aortic stenosis is present.  8. There is mild (Grade II) atheroma plaque involving the descending  aorta.  9. The inferior vena cava is normal in size with greater than 50%  respiratory variability,  suggesting right atrial pressure of 3 mmHg.  10. Evidence of atrial level shunting detected by color flow Doppler.  There is a small patent foramen ovale with predominantly left to right  shunting across the atrial septum.  - Conclusion(s)/Recommendation(s): Normal biventricular function without  evidence of hemodynamically significant valvular heart disease.  - (Comparison TTE 01/08/20: LVEF 40-45%, LV endocardial border not optimally defined to evaluate regional wall motion, elevated LVEDP, RVSP 41.2 mmHg, aortic root 39 mm; 12/21/16: EF 40-45%, diffuse hypokinesis, moderate TR, PA peak pressure 60 mmHg)   Myocardial Perfusion 05/04/16  The left ventricular ejection fraction is mildly decreased (45-54%).  Nuclear stress EF is calculated at 47% but appears to be at 55%.  There was no ST segment deviation noted during stress.  There is a small defect of mild severity present in the mid anterior and apical anterior location. 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 No ischemia noted.  This is a low risk study.   Cardiac cath 04/12/10 IMPRESSION: 1. Normal left ventricular function with a very minimal zone of mid     anterolateral hypocontractility. 2. A 10% to 20% narrowing in the ostium of the left main with 75%     stenosis in the mid left anterior descending after the bypassed     diagonal vessel and septal perforating artery. 3. Normal circumflex system. 4. Normal dominant right coronary artery. 5. Patent vein graft supplying the diagonal vessel. 6. Atretic left internal mammary artery graft which had previously     supplied the left anterior descending. 7. Successful percutaneous coronary intervention to the left anterior     descending with the 75% to 80% stenosis being reduced to 0% with     ultimate insertion of a 3.0- x 20-mm drug-eluting stent Promus     Element stent postdilated to 3.25 mm. 8. Angiomax/60 mg oral Effient/IC  nitroglycerin.   Past Medical History:  Diagnosis Date  . Arthritis    "shoulders" (09/07/2016)  . Atrial fibrillation ([place])   . BPH (benign prostatic hyperplasia)   . Chronic kidney disease (CKD), stage III (moderate) (HCC)   . Coronary artery disease   . DDD (degenerative disc disease), cervical   . DVT (deep venous thrombosis) ([place]) 12/2013   LLE  . GERD (gastroesophageal reflux disease)   . Gout   . High cholesterol   . History of blood transfusion 09/06/2016   2 u PRBC  . History of scarlet fever 1940s  . History of stress test 06/2011   No significant ischemia, this is a low risk scan. Clinical correlation recommended Abnormal myocardial perfusion study.  Marland Kitchen Hx of echocardiogram 05/2009   EF 40-45%, he did have mild annular calcification with mild-to-moderate MR and mild TR as well as aortic valve sclerosis. Estimated RV systolic pressure was 21 mm.  . Hypertension   . Iron deficiency anemia   . Kidney failure   . 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 Neck pain    "not chronic" (09/07/2016)  . Obesity   . OSA on CPAP   . Presence of permanent cardiac pacemaker   . RBBB   . Renal insufficiency   . Right renal mass   . Spinal stenosis of lumbar region     Past Surgical History:  Procedure Laterality Date  . APPENDECTOMY    . AV FISTULA PLACEMENT Right 02/09/2020  Procedure: ARTERIOVENOUS (AV) FISTULA  CREATION RIGHT UPPER EXTREMITY;  Surgeon: Marty Heck, MD;  Location: [place];  Service: Vascular;  Laterality: Right;  . CARDIAC CATHETERIZATION  2009   he was found to have a atretic LIMA graft to his LAD and had a patent vein graft supplying his diagonal vessel. At that time, he had 70% narrowing in his LAD beyond a diagonal vessel which was initially treated medically.  . COLONOSCOPY WITH PROPOFOL Left 09/09/2016   Procedure: COLONOSCOPY WITH PROPOFOL;  Surgeon: Arta Silence, MD;  Location: [place];  Service: Endoscopy;  Laterality: Left;  .  CORONARY ANGIOPLASTY WITH STENT PLACEMENT  April 2012   LAD DES  . CORONARY ARTERY BYPASS GRAFT  1992   with LIMA to the LAD and diagonal.  . CYSTOSCOPY N/A 03/31/2019   Procedure: CYSTOSCOPY FLEXIBLE;  Surgeon: Lucas Mallow, MD;  Location: [facility];  Service: Urology;  Laterality: N/A;  . CYSTOSCOPY WITH URETHRAL DILATATION N/A 05/21/2019   Procedure: CYSTOSCOPY WITH URETHRAL BALLOON DILATATION;  Surgeon: Lucas Mallow, MD;  [facility];  Service: Urology;  Laterality: N/A;  . ESOPHAGOGASTRODUODENOSCOPY N/A 11/16/2019   Procedure: ESOPHAGOGASTRODUODENOSCOPY (EGD);  Surgeon: Wilford Corner, MD;  Location: [place];  Service: Endoscopy;  Laterality: N/A;  . ESOPHAGOGASTRODUODENOSCOPY (EGD) WITH PROPOFOL Left 09/08/2016   Procedure: ESOPHAGOGASTRODUODENOSCOPY (EGD) WITH PROPOFOL;  Surgeon: Ronnette Juniper, MD;  Location: [place];  Service: Gastroenterology;  Laterality: Left;  . EXCHANGE OF A DIALYSIS CATHETER Right 02/09/2020   Procedure: EXCHANGE OF A DIALYSIS CATHETER;  Surgeon: Marty Heck, MD;  Location: [place];  Service: Vascular;  Laterality: Right;  . GIVENS CAPSULE STUDY N/A 11/16/2019   Procedure: GIVENS CAPSULE STUDY;  Surgeon: Wilford Corner, MD;  Location[facility];  Service: Endoscopy;  Laterality: N/A;  . INGUINAL HERNIA REPAIR     "don't remeimber which side"  . IR FLUORO GUIDE CV LINE RIGHT  02/05/2020  . IR US GUIDE VASC ACCESS RIGHT  02/05/2020  . LAPAROSCOPIC NEPHRECTOMY Right 03/31/2019   Procedure: RIGHT  HAND ASSIST LAPAROSCOPIC NEPHRECTOMY;  Surgeon: Lucas Mallow, MD;  Location[facility];  Service: Urology;  Laterality: Right;  . PACEMAKER IMPLANT N/A 12/25/2016   Procedure: PACEMAKER IMPLANT;  Surgeon: Deboraha Sprang, MD;  Location: [place] CV LAB;  Service: Cardiovascular;  Laterality: N/A;  . TEE WITHOUT CARDIOVERSION N/A 02/06/2020   Procedure: TRANSESOPHAGEAL ECHOCARDIOGRAM (TEE);  Surgeon: Skeet Latch, MD;  Location: [place];  Service: Cardiovascular;  Laterality: N/A;  . TONSILLECTOMY      MEDICATIONS: No current facility-administered medications for this encounter.   Marland Kitchen acetaminophen (TYLENOL) 500 MG tablet  . allopurinol (ZYLOPRIM) 300 MG tablet  . amLODipine (NORVASC) 5 MG tablet  . apixaban (ELIQUIS) 5 MG TABS tablet  . atorvastatin (LIPITOR) 20 MG tablet  . B Complex-C (B-COMPLEX WITH VITAMIN C) tablet  . docusate sodium (COLACE) 100 MG capsule  . metoprolol succinate (TOPROL-XL) 25 MG 24 hr tablet  . pantoprazole (PROTONIX) 40 MG tablet  . Tamsulosin HCl (FLOMAX) 0.4 MG CAPS  . ferrous sulfate 325 (65 FE) MG tablet  . Methoxy PEG-Epoetin Beta (MIRCERA IJ)    Myra Gianotti, PA-C Surgical Short Stay/Anesthesiology [facility] Phone [phone] [facility] Phone [phone] 03/29/2020 11:41 AM

## 2020-03-29 NOTE — Anesthesia Preprocedure Evaluation (Addendum)
Anesthesia Evaluation  Patient identified by MRN, date of birth, ID band Patient awake    Reviewed: Allergy & Precautions, NPO status , Patient's Chart, lab work & pertinent test results  History of Anesthesia Complications Negative for: history of anesthetic complications  Airway Mallampati: IV  TM Distance: >3 FB Neck ROM: Full    Dental  (+) Teeth Intact, Dental Advisory Given   Pulmonary neg shortness of breath, sleep apnea and Continuous Positive Airway Pressure Ventilation , neg COPD, neg recent URI, former smoker,  Covid-19 Nucleic Acid Test Results Lab Results      Component                Value               Date                      SARSCOV2NAA              POSITIVE (A)        01/20/2020                [place]              NEGATIVE            11/14/2019                [place]              NEGATIVE            05/17/2019                [place]              NEGATIVE            03/27/2019              breath sounds clear to auscultation       Cardiovascular hypertension, Pt. on medications + CAD, + CABG and +CHF  + dysrhythmias Atrial Fibrillation + pacemaker  Rhythm:Regular  TEE 02/06/20 IMPRESSIONS  1. Global hypokinesis worse in the septum. Left ventricular ejection  fraction, by estimation, is 40 to 45%. The left ventricle has mildly  decreased function. The left ventricle demonstrates global hypokinesis.  Left ventricular diastolic function could  not be evaluated.  2. Right ventricular systolic function is mildly reduced. The right  ventricular size is normal.  3. Thrombus in the left atrial appendage measuring 1.5 x 0.6 cm. Left  atrial size was mildly dilated. A left atrial/left atrial appendage  thrombus was detected. The LAA emptying velocity was 47 cm/s.  4. Pacemaker wire.  5. The mitral valve is normal in structure. Mild mitral valve  regurgitation. No evidence of mitral stenosis.  6.  Tricuspid valve regurgitation is mild to moderate.  7. The aortic valve is tricuspid. Aortic valve regurgitation is not  visualized. No aortic stenosis is present.  8. There is mild (Grade II) atheroma plaque involving the descending  aorta.  9. The inferior vena cava is normal in size with greater than 50%  respiratory variability, suggesting right atrial pressure of 3 mmHg.  10. Evidence of atrial level shunting detected by color flow Doppler.  There is a small patent foramen ovale with predominantly left to right  shunting across the atrial septum.     Neuro/Psych  Neuromuscular disease negative psych ROS   GI/Hepatic Neg liver ROS, GERD  ,  Endo/Other  negative endocrine ROS  Renal/GU ESRF and DialysisRenal  diseaseLab Results      Component                Value               Date                      CREATININE               3.10 (H)            03/30/2020           Lab Results      Component                Value               Date                      K                        3.2 (L)             03/30/2020                Musculoskeletal  (+) Arthritis ,   Abdominal   Peds  Hematology  (+) Blood dyscrasia, anemia , .Lab Results      Component                Value               Date                      WBC                      9.4                 02/12/2020                HGB                      7.8 (L)             03/30/2020                HCT                      23.0 (L)            03/30/2020                MCV                      85.6                02/12/2020                PLT                      141 (L)             02/12/2020              Anesthesia Other Findings History includesformer smoker, HTN, HLD, OSA (on CPAP), CAD (s/p CABG: LIMA-LAD, SVG-DIAG 1992; DES LAD 04/12/10), chronic diastolic/systolic CHF, PAF (LA thrombus on 02/06/20 TEE, small PFO, on Eliquis), DVT (LLE 12/2013), 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  Recent primary cardiology and EP cardiology visits in 02/2020 following hospitalizationsHe denied chest pain and SOB per PAT RN phone interview. Per VVS instructions, hold Eliquis for 3 days prior to surgery. Last dose 03/27/20.   Reproductive/Obstetrics                            Anesthesia Physical Anesthesia Plan  ASA: III  Anesthesia Plan: General   Post-op Pain Management:    Induction: Intravenous  PONV Risk Score and Plan: 2 and Ondansetron and Dexamethasone  Airway Management Planned: LMA  Additional Equipment: None  Intra-op Plan:   Post-operative Plan: Extubation in OR  Informed Consent: I have reviewed the patients History and Physical, chart, labs and discussed the procedure including the risks, benefits and alternatives for the proposed anesthesia with the patient or authorized representative who has indicated his/her understanding and acceptance.     Dental advisory given  Plan Discussed with: CRNA and Surgeon  Anesthesia Plan Comments: (See PAT note written 03/29/2020 by Myra Gianotti, PA-C. )      Anesthesia Quick Evaluation

## 2020-03-29 NOTE — Progress Notes (Signed)
PERIOPERATIVE PRESCRIPTION FOR IMPLANTED CARDIAC DEVICE PROGRAMMING  Patient Information: Name:  Paul Price  DOB:  Dec 21, 1940  MRN:  [id number]    Lorin Glass, RN  [facility] Device Cc: Clayborn Bigness, RN Planned Procedure: right Second Stage Basilic Vein Transposition  Surgeon: Dr. [name]  Date of Procedure: 03/30/20  Cautery will be used.  Position during surgery: supine   Please send documentation back to:  [facility] (Fax # [phone])   Lorin Glass, RN  03/27/2020 5:25 PM     Device Information:  Clinic EP Physician:  Virl Axe, MD   Device Type:  Pacemaker Manufacturer and Phone #:  Medtronic: [id number] Pacemaker Dependent?:  Yes.   Date of Last Device Check:  03/16/20 Normal Device Function?:  Yes.    Electrophysiologist's Recommendations:   Have magnet available.  Provide continuous ECG monitoring when magnet is used or reprogramming is to be performed.   Procedure may interfere with device function.  Magnet should be placed over device during procedure.  Per Device Clinic Standing Orders, [place] Ram, RN  3:40 PM 03/29/2020

## 2020-03-30 ENCOUNTER — Ambulatory Visit (HOSPITAL_COMMUNITY): Payer: Medicare Other | Admitting: Vascular Surgery

## 2020-03-30 ENCOUNTER — Encounter (HOSPITAL_COMMUNITY): Payer: Self-pay | Admitting: Vascular Surgery

## 2020-03-30 ENCOUNTER — Other Ambulatory Visit: Payer: Self-pay

## 2020-03-30 ENCOUNTER — Ambulatory Visit (HOSPITAL_COMMUNITY)
Admission: RE | Admit: 2020-03-30 | Discharge: 2020-03-30 | Disposition: A | Discharge status: Home / Self Care | Payer: Medicare Other | Attending: Vascular Surgery | Admitting: Vascular Surgery

## 2020-03-30 ENCOUNTER — Encounter (HOSPITAL_COMMUNITY)
Admission: RE | Disposition: A | Discharge status: Home / Self Care | Payer: Self-pay | Source: Home / Self Care | Attending: Vascular Surgery

## 2020-03-30 DIAGNOSIS — I4891 Unspecified atrial fibrillation: Secondary | ICD-10-CM | POA: Diagnosis not present

## 2020-03-30 DIAGNOSIS — N185 Chronic kidney disease, stage 5: Secondary | ICD-10-CM | POA: Diagnosis not present

## 2020-03-30 DIAGNOSIS — Z86718 Personal history of other venous thrombosis and embolism: Secondary | ICD-10-CM | POA: Insufficient documentation

## 2020-03-30 DIAGNOSIS — D631 Anemia in chronic kidney disease: Secondary | ICD-10-CM | POA: Diagnosis not present

## 2020-03-30 DIAGNOSIS — I5043 Acute on chronic combined systolic (congestive) and diastolic (congestive) heart failure: Secondary | ICD-10-CM | POA: Diagnosis not present

## 2020-03-30 DIAGNOSIS — Z7901 Long term (current) use of anticoagulants: Secondary | ICD-10-CM | POA: Diagnosis not present

## 2020-03-30 DIAGNOSIS — N179 Acute kidney failure, unspecified: Secondary | ICD-10-CM | POA: Diagnosis not present

## 2020-03-30 DIAGNOSIS — I132 Hypertensive heart and chronic kidney disease with heart failure and with stage 5 chronic kidney disease, or end stage renal disease: Secondary | ICD-10-CM | POA: Diagnosis not present

## 2020-03-30 DIAGNOSIS — N186 End stage renal disease: Secondary | ICD-10-CM | POA: Diagnosis not present

## 2020-03-30 DIAGNOSIS — Z992 Dependence on renal dialysis: Secondary | ICD-10-CM | POA: Insufficient documentation

## 2020-03-30 DIAGNOSIS — Z79899 Other long term (current) drug therapy: Secondary | ICD-10-CM | POA: Insufficient documentation

## 2020-03-30 HISTORY — PX: BASCILIC VEIN TRANSPOSITION: SHX5742

## 2020-03-30 HISTORY — DX: Presence of cardiac pacemaker: Z95.0

## 2020-03-30 LAB — POCT I-STAT, CHEM 8
BUN: 22 mg/dL (ref 8–23)
Calcium, Ion: 1.06 mmol/L — ABNORMAL LOW (ref 1.15–1.40)
Chloride: 93 mmol/L — ABNORMAL LOW (ref 98–111)
Creatinine, Ser: 3.1 mg/dL — ABNORMAL HIGH (ref 0.61–1.24)
Glucose, Bld: 113 mg/dL — ABNORMAL HIGH (ref 70–99)
HCT: 23 % — ABNORMAL LOW (ref 39.0–52.0)
Hemoglobin: 7.8 g/dL — ABNORMAL LOW (ref 13.0–17.0)
Potassium: 3.2 mmol/L — ABNORMAL LOW (ref 3.5–5.1)
Sodium: 139 mmol/L (ref 135–145)
TCO2: 32 mmol/L (ref 22–32)

## 2020-03-30 SURGERY — TRANSPOSITION, VEIN, BASILIC
Anesthesia: General | Site: Arm Upper | Laterality: Right

## 2020-03-30 MED ORDER — ACETAMINOPHEN 10 MG/ML IV SOLN: 1000.0000 mg | Freq: Once | INTRAVENOUS | Status: DC | PRN

## 2020-03-30 MED ORDER — DEXAMETHASONE SODIUM PHOSPHATE 10 MG/ML IJ SOLN
INTRAMUSCULAR | Status: AC
  Filled 2020-03-30: qty 1

## 2020-03-30 MED ORDER — FENTANYL CITRATE (PF) 100 MCG/2ML IJ SOLN
25.0000 ug | INTRAMUSCULAR | Status: DC | PRN
Start: 2020-03-30 — End: 2020-03-30

## 2020-03-30 MED ORDER — LIDOCAINE 2% (20 MG/ML) 5 ML SYRINGE
INTRAMUSCULAR | Status: AC
  Filled 2020-03-30: qty 5

## 2020-03-30 MED ORDER — SODIUM CHLORIDE 0.9 % IV SOLN
INTRAVENOUS | Status: DC | PRN
  Administered 2020-03-30: 500 mL

## 2020-03-30 MED ORDER — SODIUM CHLORIDE 0.9 % IV SOLN
INTRAVENOUS | Status: AC
  Filled 2020-03-30: qty 1.2

## 2020-03-30 MED ORDER — FENTANYL CITRATE (PF) 250 MCG/5ML IJ SOLN
INTRAMUSCULAR | Status: DC | PRN
  Administered 2020-03-30 (×2): 50 ug via INTRAVENOUS

## 2020-03-30 MED ORDER — CHLORHEXIDINE GLUCONATE 4 % EX LIQD: 60.0000 mL | Freq: Once | CUTANEOUS | Status: DC

## 2020-03-30 MED ORDER — ONDANSETRON HCL 4 MG/2ML IJ SOLN
INTRAMUSCULAR | Status: DC | PRN
  Administered 2020-03-30: 4 mg via INTRAVENOUS

## 2020-03-30 MED ORDER — FENTANYL CITRATE (PF) 250 MCG/5ML IJ SOLN
INTRAMUSCULAR | Status: AC
  Filled 2020-03-30: qty 5

## 2020-03-30 MED ORDER — 0.9 % SODIUM CHLORIDE (POUR BTL) OPTIME
TOPICAL | Status: DC | PRN
  Administered 2020-03-30: 1000 mL

## 2020-03-30 MED ORDER — HEPARIN SODIUM (PORCINE) 1000 UNIT/ML IJ SOLN
1000.0000 [IU] | Freq: Once | INTRAMUSCULAR | Status: AC
  Administered 2020-03-30: 1000 [IU]

## 2020-03-30 MED ORDER — DEXAMETHASONE SODIUM PHOSPHATE 10 MG/ML IJ SOLN
INTRAMUSCULAR | Status: DC | PRN
  Administered 2020-03-30: 5 mg via INTRAVENOUS

## 2020-03-30 MED ORDER — METOPROLOL TARTRATE 12.5 MG HALF TABLET
ORAL_TABLET | ORAL | Status: AC
  Administered 2020-03-30: 50 mg via ORAL
  Filled 2020-03-30: qty 2

## 2020-03-30 MED ORDER — OXYCODONE HCL 5 MG PO TABS
ORAL_TABLET | ORAL | Status: AC
  Filled 2020-03-30: qty 1

## 2020-03-30 MED ORDER — ACETAMINOPHEN 500 MG PO TABS
ORAL_TABLET | ORAL | Status: AC
  Filled 2020-03-30: qty 2

## 2020-03-30 MED ORDER — PROPOFOL 10 MG/ML IV BOLUS
INTRAVENOUS | Status: DC | PRN
  Administered 2020-03-30: 130 mg via INTRAVENOUS

## 2020-03-30 MED ORDER — OXYCODONE-ACETAMINOPHEN 5-325 MG PO TABS: 1.0000 | ORAL_TABLET | Freq: Four times a day (QID) | ORAL | 0 refills | Status: DC | PRN

## 2020-03-30 MED ORDER — ACETAMINOPHEN 160 MG/5ML PO SOLN: 1000.0000 mg | Freq: Once | ORAL | Status: AC | PRN

## 2020-03-30 MED ORDER — SUCCINYLCHOLINE CHLORIDE 200 MG/10ML IV SOSY
PREFILLED_SYRINGE | INTRAVENOUS | Status: AC
  Filled 2020-03-30: qty 10

## 2020-03-30 MED ORDER — ONDANSETRON HCL 4 MG/2ML IJ SOLN
INTRAMUSCULAR | Status: AC
  Filled 2020-03-30: qty 4

## 2020-03-30 MED ORDER — PHENYLEPHRINE 40 MCG/ML (10ML) SYRINGE FOR IV PUSH (FOR BLOOD PRESSURE SUPPORT)
PREFILLED_SYRINGE | INTRAVENOUS | Status: AC
  Filled 2020-03-30: qty 10

## 2020-03-30 MED ORDER — METOPROLOL TARTRATE 12.5 MG HALF TABLET
ORAL_TABLET | ORAL | Status: AC
  Filled 2020-03-30: qty 2

## 2020-03-30 MED ORDER — OXYCODONE HCL 5 MG/5ML PO SOLN: 5.0000 mg | Freq: Once | ORAL | Status: AC | PRN

## 2020-03-30 MED ORDER — METOPROLOL TARTRATE 50 MG PO TABS: 50.0000 mg | ORAL_TABLET | Freq: Once | ORAL | Status: AC

## 2020-03-30 MED ORDER — ROCURONIUM BROMIDE 10 MG/ML (PF) SYRINGE
PREFILLED_SYRINGE | INTRAVENOUS | Status: AC
  Filled 2020-03-30: qty 10

## 2020-03-30 MED ORDER — ACETAMINOPHEN 500 MG PO TABS
1000.0000 mg | ORAL_TABLET | Freq: Once | ORAL | Status: AC | PRN
  Administered 2020-03-30: 1000 mg via ORAL

## 2020-03-30 MED ORDER — CHLORHEXIDINE GLUCONATE 0.12 % MT SOLN
15.0000 mL | Freq: Once | OROMUCOSAL | Status: AC
  Administered 2020-03-30: 15 mL via OROMUCOSAL
  Filled 2020-03-30: qty 15

## 2020-03-30 MED ORDER — ONDANSETRON HCL 4 MG/2ML IJ SOLN
INTRAMUSCULAR | Status: AC
  Filled 2020-03-30: qty 2

## 2020-03-30 MED ORDER — DEXAMETHASONE SODIUM PHOSPHATE 10 MG/ML IJ SOLN
INTRAMUSCULAR | Status: AC
  Filled 2020-03-30: qty 2

## 2020-03-30 MED ORDER — SODIUM CHLORIDE 0.9 % IV SOLN: INTRAVENOUS | Status: DC

## 2020-03-30 MED ORDER — EPHEDRINE 5 MG/ML INJ
INTRAVENOUS | Status: AC
  Filled 2020-03-30: qty 10

## 2020-03-30 MED ORDER — OXYCODONE HCL 5 MG PO TABS
5.0000 mg | ORAL_TABLET | Freq: Once | ORAL | Status: AC | PRN
  Administered 2020-03-30: 5 mg via ORAL

## 2020-03-30 MED ORDER — ORAL CARE MOUTH RINSE: 15.0000 mL | Freq: Once | OROMUCOSAL | Status: AC

## 2020-03-30 MED ORDER — PROPOFOL 1000 MG/100ML IV EMUL
INTRAVENOUS | Status: AC
  Filled 2020-03-30: qty 100

## 2020-03-30 MED ORDER — CEFAZOLIN SODIUM-DEXTROSE 2-4 GM/100ML-% IV SOLN
2.0000 g | INTRAVENOUS | Status: AC
  Administered 2020-03-30: 2 g via INTRAVENOUS
  Filled 2020-03-30: qty 100

## 2020-03-30 SURGICAL SUPPLY — 31 items
ARMBAND PINK RESTRICT EXTREMIT (MISCELLANEOUS) ×2 IMPLANT
CANISTER SUCT 3000ML PPV (MISCELLANEOUS) ×2 IMPLANT
CLIP VESOCCLUDE MED 24/CT (CLIP) IMPLANT
CLIP VESOCCLUDE MED 6/CT (CLIP) IMPLANT
CLIP VESOCCLUDE SM WIDE 24/CT (CLIP) IMPLANT
CLIP VESOCCLUDE SM WIDE 6/CT (CLIP) IMPLANT
COVER PROBE W GEL 5X96 (DRAPES) ×2 IMPLANT
COVER WAND RF STERILE (DRAPES) ×2 IMPLANT
DERMABOND ADVANCED (GAUZE/BANDAGES/DRESSINGS) ×1
DERMABOND ADVANCED .7 DNX12 (GAUZE/BANDAGES/DRESSINGS) ×1 IMPLANT
ELECT REM PT RETURN 9FT ADLT (ELECTROSURGICAL) ×2
ELECTRODE REM PT RTRN 9FT ADLT (ELECTROSURGICAL) ×1 IMPLANT
GLOVE BIO SURGEON STRL SZ7.5 (GLOVE) ×2 IMPLANT
GOWN STRL REUS W/ TWL LRG LVL3 (GOWN DISPOSABLE) ×2 IMPLANT
GOWN STRL REUS W/ TWL XL LVL3 (GOWN DISPOSABLE) ×1 IMPLANT
GOWN STRL REUS W/TWL LRG LVL3 (GOWN DISPOSABLE) ×4
GOWN STRL REUS W/TWL XL LVL3 (GOWN DISPOSABLE) ×2
KIT BASIN OR (CUSTOM PROCEDURE TRAY) ×2 IMPLANT
KIT TURNOVER KIT B (KITS) ×2 IMPLANT
NS IRRIG 1000ML POUR BTL (IV SOLUTION) ×2 IMPLANT
PACK CV ACCESS (CUSTOM PROCEDURE TRAY) ×2 IMPLANT
PAD ARMBOARD 7.5X6 YLW CONV (MISCELLANEOUS) ×4 IMPLANT
SUT MNCRL AB 4-0 PS2 18 (SUTURE) ×4 IMPLANT
SUT PROLENE 5 0 C 1 24 (SUTURE) ×2 IMPLANT
SUT PROLENE 6 0 BV (SUTURE) ×6 IMPLANT
SUT SILK 2 0 SH (SUTURE) ×4 IMPLANT
SUT VIC AB 3-0 SH 27 (SUTURE) ×2
SUT VIC AB 3-0 SH 27X BRD (SUTURE) ×1 IMPLANT
TOWEL GREEN STERILE (TOWEL DISPOSABLE) ×2 IMPLANT
UNDERPAD 30X36 HEAVY ABSORB (UNDERPADS AND DIAPERS) ×2 IMPLANT
WATER STERILE IRR 1000ML POUR (IV SOLUTION) ×2 IMPLANT

## 2020-03-30 NOTE — Anesthesia Procedure Notes (Signed)
Procedure Name: LMA Insertion Date/Time: 03/30/2020 9:37 AM Performed by: Darletta Moll, CRNA Pre-anesthesia Checklist: Patient identified, Emergency Drugs available, Suction available and Patient being monitored Patient Re-evaluated:Patient Re-evaluated prior to induction Oxygen Delivery Method: Circle system utilized Preoxygenation: Pre-oxygenation with 100% oxygen Induction Type: IV induction Ventilation: Mask ventilation without difficulty LMA: LMA inserted LMA Size: 4.0 Number of attempts: 1 Placement Confirmation: positive ETCO2,  breath sounds checked- equal and bilateral and CO2 detector Tube secured with: Tape Dental Injury: Teeth and Oropharynx as per pre-operative assessment

## 2020-03-30 NOTE — Discharge Instructions (Signed)
   [facility]  Discharge Instructions  AV Fistula or Graft Surgery for Dialysis Access  Please refer to the following instructions for your post-procedure care. Your surgeon or physician assistant will discuss any changes with you.  Activity  You may drive the day following your surgery, if you are comfortable and no longer taking prescription pain medication. Resume full activity as the soreness in your incision resolves.  Bathing/Showering  You may shower after you go home. Keep your incision dry for 48 hours. Do not soak in a bathtub, hot tub, or swim until the incision heals completely. You may not shower if you have a hemodialysis catheter.  Incision Care  Clean your incision with mild soap and water after 48 hours. Pat the area dry with a clean towel. You do not need a bandage unless otherwise instructed. Do not apply any ointments or creams to your incision. You may have skin glue on your incision. Do not peel it off. It will come off on its own in about one week. Your arm may swell a bit after surgery. To reduce swelling use pillows to elevate your arm so it is above your heart. Your doctor will tell you if you need to lightly wrap your arm with an ACE bandage.  Diet  Resume your normal diet. There are not special food restrictions following this procedure. In order to heal from your surgery, it is CRITICAL to get adequate nutrition. Your body requires vitamins, minerals, and protein. Vegetables are the best source of vitamins and minerals. Vegetables also provide the perfect balance of protein. Processed food has little nutritional value, so try to avoid this.  Medications  Resume taking all of your medications. If your incision is causing pain, you may take over-the counter pain relievers such as acetaminophen (Tylenol). If you were prescribed a stronger pain medication, please be aware these medications can cause nausea and constipation. Prevent  nausea by taking the medication with a snack or meal. Avoid constipation by drinking plenty of fluids and eating foods with high amount of fiber, such as fruits, vegetables, and grains.  Do not take Tylenol if you are taking prescription pain medications.  Follow up Your surgeon may want to see you in the office following your access surgery. If so, this will be arranged at the time of your surgery.  Please call us immediately for any of the following conditions:  . Increased pain, redness, drainage (pus) from your incision site . Fever of 101 degrees or higher . Severe or worsening pain at your incision site . Hand pain or numbness. .  Reduce your risk of vascular disease:  . Stop smoking. If you would like help, call [facility] at 1-800-QUIT-NOW (514)592-7202) or [place] at 201-505-5301  . Manage your cholesterol . Maintain a desired weight . Control your diabetes . Keep your blood pressure down  Dialysis  It will take several weeks to several months for your new dialysis access to be ready for use. Your surgeon will determine when it is okay to use it. Your nephrologist will continue to direct your dialysis. You can continue to use your Permcath until your new access is ready for use.   03/30/2020 Paul Price [id number] 1940-11-08  Surgeon(s): Waynetta Sandy, MD  Procedure(s): RIGHT SECOND STAGE BASILIC VEIN TRANSPOSITION  x Do not stick fistula for 8 weeks    If you have any questions, please call the office at [phone].

## 2020-03-30 NOTE — Interval H&P Note (Signed)
History and Physical Interval Note:  03/30/2020 8:32 AM  Paul Price  has presented today for surgery, with the diagnosis of ESRD.  The various methods of treatment have been discussed with the patient and family. After consideration of risks, benefits and other options for treatment, the patient has consented to  Procedure(s): RIGHT SECOND STAGE [place] (Right) as a surgical intervention.  The patient's history has been reviewed, patient examined, no change in status, stable for surgery.  I have reviewed the patient's chart and labs.  Questions were answered to the patient's satisfaction.     [name]

## 2020-03-30 NOTE — Progress Notes (Signed)
Instructed patient to contact pcp about open wound on his coccyx. [place] RN consulted and stated that he would need to see his pcp for further orders. Advised patient to keep pressure off wound by turning side to side every two hours.

## 2020-03-30 NOTE — Op Note (Signed)
    [name] MRN: [id number] DOB: 1940-07-13 Sex: male  03/30/2020 Pre-operative Diagnosis: End-stage renal disease Post-operative diagnosis:  Same Surgeon:  Erlene Quan C. Donzetta Matters, MD Assistant: Leontine Locket, PA Procedure Performed:   Right upper extremity basilic vein transposition fistula creation   Indications: 80 year old male currently on dialysis via catheter.  He has a previous first stage basilic vein fistula on the right he is now indicated for a second.  Assistant was necessary to expedite the case.  Findings: Fistula was large measuring up to 1 cm throughout the course.  At completion there was a very strong thrill and a palpable radial artery pulse the wrist.   Procedure:  The patient was identified in the holding area and taken to the operating where he is placed supine on operative table and LMA anesthesia induced.  He was sterilely prepped and draped in the right upper extremity usual fashion antibiotics were ministered timeout is called.  We began with longitudinal incision just above the antecubitum.  We dissected down to the fistula itself.  The nerve was protected throughout its course.  Branches were divided between clips and ties.  Second incision was made in the axilla.  All branches were divided.  The fascia was marked for orientation.  Was clamped near the antecubitum and transected and pulled out the axillary incision.  Was flushed with heparinized saline clamped reflux noted to not be leaking marked relief orientation tunneled laterally.  Both ends were spatulated against flushed the fistula with heparinized saline reclamped it.  We sewed end-to-end with 6-0 Prolene suture.  Prior completion low flushing all directions.  Upon completion there was a very strong thrill in the fistula itself and a palpable radial artery pulse the wrist.  Satisfied we obtain hemostasis irrigated the wounds and closed in layers of Vicryl and Monocryl.  He was awakened from  anesthesia having tolerated procedure well without immediate complication.  All counts were correct at completion.  EBL: 50 cc     Brandon C. Donzetta Matters, MD [facility] Office: [phone] Pager: [phone]

## 2020-03-30 NOTE — Transfer of Care (Signed)
Immediate Anesthesia Transfer of Care Note  Patient: Paul Price  Procedure(s) Performed: RIGHT SECOND STAGE BASCILIC VEIN TRANSPOSITION (Right Arm Upper)  Patient Location: PACU  Anesthesia Type:General  Level of Consciousness: drowsy and patient cooperative  Airway & Oxygen Therapy: Patient Spontanous Breathing  Post-op Assessment: Report given to RN, Post -op Vital signs reviewed and stable and Patient moving all extremities X 4  Post vital signs: Reviewed and stable  Last Vitals:  Vitals Value Taken Time  BP 114/49 03/30/20 1049  Temp    Pulse 62 03/30/20 1051  Resp 18 03/30/20 1051  SpO2 93 % 03/30/20 1051  Vitals shown include unvalidated device data.  Last Pain:  Vitals:   03/30/20 0716  TempSrc:   PainSc: 0-No pain         Complications: No complications documented.

## 2020-03-31 ENCOUNTER — Encounter (HOSPITAL_COMMUNITY): Payer: Self-pay | Admitting: Vascular Surgery

## 2020-03-31 DIAGNOSIS — T8249XD Other complication of vascular dialysis catheter, subsequent encounter: Secondary | ICD-10-CM | POA: Diagnosis not present

## 2020-03-31 DIAGNOSIS — N186 End stage renal disease: Secondary | ICD-10-CM | POA: Diagnosis not present

## 2020-03-31 DIAGNOSIS — D689 Coagulation defect, unspecified: Secondary | ICD-10-CM | POA: Diagnosis not present

## 2020-03-31 DIAGNOSIS — Z992 Dependence on renal dialysis: Secondary | ICD-10-CM | POA: Diagnosis not present

## 2020-03-31 DIAGNOSIS — D509 Iron deficiency anemia, unspecified: Secondary | ICD-10-CM | POA: Diagnosis not present

## 2020-03-31 DIAGNOSIS — D631 Anemia in chronic kidney disease: Secondary | ICD-10-CM | POA: Diagnosis not present

## 2020-03-31 DIAGNOSIS — N2581 Secondary hyperparathyroidism of renal origin: Secondary | ICD-10-CM | POA: Diagnosis not present

## 2020-03-31 DIAGNOSIS — Z23 Encounter for immunization: Secondary | ICD-10-CM | POA: Diagnosis not present

## 2020-03-31 NOTE — Anesthesia Postprocedure Evaluation (Signed)
Anesthesia Post Note  Patient: Carmine E Schwebach  Procedure(s) Performed: RIGHT SECOND STAGE BASCILIC VEIN TRANSPOSITION (Right Arm Upper)     Patient location during evaluation: PACU Anesthesia Type: General Level of consciousness: awake and alert Pain management: pain level controlled Vital Signs Assessment: post-procedure vital signs reviewed and stable Respiratory status: spontaneous breathing, nonlabored ventilation, respiratory function stable and patient connected to nasal cannula oxygen Cardiovascular status: blood pressure returned to baseline and stable Postop Assessment: no apparent nausea or vomiting Anesthetic complications: no   No complications documented.  Last Vitals:  Vitals:   03/30/20 1120 03/30/20 1134  BP: (!) 116/50 (!) 110/51  Pulse: (!) 52 63  Resp: 20 19  Temp:  (!) 36.3 C  SpO2: 96% 98%    Last Pain:  Vitals:   03/30/20 1134  TempSrc:   PainSc: 2                  Edilberto Roosevelt

## 2020-04-02 DIAGNOSIS — N186 End stage renal disease: Secondary | ICD-10-CM | POA: Diagnosis not present

## 2020-04-02 DIAGNOSIS — Z992 Dependence on renal dialysis: Secondary | ICD-10-CM | POA: Diagnosis not present

## 2020-04-02 DIAGNOSIS — D689 Coagulation defect, unspecified: Secondary | ICD-10-CM | POA: Diagnosis not present

## 2020-04-02 DIAGNOSIS — D509 Iron deficiency anemia, unspecified: Secondary | ICD-10-CM | POA: Diagnosis not present

## 2020-04-02 DIAGNOSIS — Z23 Encounter for immunization: Secondary | ICD-10-CM | POA: Diagnosis not present

## 2020-04-02 DIAGNOSIS — T8249XD Other complication of vascular dialysis catheter, subsequent encounter: Secondary | ICD-10-CM | POA: Diagnosis not present

## 2020-04-02 DIAGNOSIS — N2581 Secondary hyperparathyroidism of renal origin: Secondary | ICD-10-CM | POA: Diagnosis not present

## 2020-04-02 DIAGNOSIS — D631 Anemia in chronic kidney disease: Secondary | ICD-10-CM | POA: Diagnosis not present

## 2020-04-05 DIAGNOSIS — T8249XD Other complication of vascular dialysis catheter, subsequent encounter: Secondary | ICD-10-CM | POA: Diagnosis not present

## 2020-04-05 DIAGNOSIS — N186 End stage renal disease: Secondary | ICD-10-CM | POA: Diagnosis not present

## 2020-04-05 DIAGNOSIS — D631 Anemia in chronic kidney disease: Secondary | ICD-10-CM | POA: Diagnosis not present

## 2020-04-05 DIAGNOSIS — N2581 Secondary hyperparathyroidism of renal origin: Secondary | ICD-10-CM | POA: Diagnosis not present

## 2020-04-05 DIAGNOSIS — D689 Coagulation defect, unspecified: Secondary | ICD-10-CM | POA: Diagnosis not present

## 2020-04-05 DIAGNOSIS — Z992 Dependence on renal dialysis: Secondary | ICD-10-CM | POA: Diagnosis not present

## 2020-04-05 DIAGNOSIS — D509 Iron deficiency anemia, unspecified: Secondary | ICD-10-CM | POA: Diagnosis not present

## 2020-04-07 DIAGNOSIS — D509 Iron deficiency anemia, unspecified: Secondary | ICD-10-CM | POA: Diagnosis not present

## 2020-04-07 DIAGNOSIS — N186 End stage renal disease: Secondary | ICD-10-CM | POA: Diagnosis not present

## 2020-04-07 DIAGNOSIS — T8249XD Other complication of vascular dialysis catheter, subsequent encounter: Secondary | ICD-10-CM | POA: Diagnosis not present

## 2020-04-07 DIAGNOSIS — D631 Anemia in chronic kidney disease: Secondary | ICD-10-CM | POA: Diagnosis not present

## 2020-04-07 DIAGNOSIS — D689 Coagulation defect, unspecified: Secondary | ICD-10-CM | POA: Diagnosis not present

## 2020-04-07 DIAGNOSIS — N2581 Secondary hyperparathyroidism of renal origin: Secondary | ICD-10-CM | POA: Diagnosis not present

## 2020-04-07 DIAGNOSIS — Z992 Dependence on renal dialysis: Secondary | ICD-10-CM | POA: Diagnosis not present

## 2020-04-08 DIAGNOSIS — N186 End stage renal disease: Secondary | ICD-10-CM | POA: Diagnosis not present

## 2020-04-08 DIAGNOSIS — Z992 Dependence on renal dialysis: Secondary | ICD-10-CM | POA: Diagnosis not present

## 2020-04-08 DIAGNOSIS — I129 Hypertensive chronic kidney disease with stage 1 through stage 4 chronic kidney disease, or unspecified chronic kidney disease: Secondary | ICD-10-CM | POA: Diagnosis not present

## 2020-04-09 DIAGNOSIS — D631 Anemia in chronic kidney disease: Secondary | ICD-10-CM | POA: Diagnosis not present

## 2020-04-09 DIAGNOSIS — N186 End stage renal disease: Secondary | ICD-10-CM | POA: Diagnosis not present

## 2020-04-09 DIAGNOSIS — D509 Iron deficiency anemia, unspecified: Secondary | ICD-10-CM | POA: Diagnosis not present

## 2020-04-09 DIAGNOSIS — T8249XD Other complication of vascular dialysis catheter, subsequent encounter: Secondary | ICD-10-CM | POA: Diagnosis not present

## 2020-04-09 DIAGNOSIS — N2581 Secondary hyperparathyroidism of renal origin: Secondary | ICD-10-CM | POA: Diagnosis not present

## 2020-04-09 DIAGNOSIS — D689 Coagulation defect, unspecified: Secondary | ICD-10-CM | POA: Diagnosis not present

## 2020-04-09 DIAGNOSIS — Z992 Dependence on renal dialysis: Secondary | ICD-10-CM | POA: Diagnosis not present

## 2020-04-12 DIAGNOSIS — N186 End stage renal disease: Secondary | ICD-10-CM | POA: Diagnosis not present

## 2020-04-12 DIAGNOSIS — Z23 Encounter for immunization: Secondary | ICD-10-CM | POA: Diagnosis not present

## 2020-04-12 DIAGNOSIS — N2581 Secondary hyperparathyroidism of renal origin: Secondary | ICD-10-CM | POA: Diagnosis not present

## 2020-04-12 DIAGNOSIS — T8249XD Other complication of vascular dialysis catheter, subsequent encounter: Secondary | ICD-10-CM | POA: Diagnosis not present

## 2020-04-12 DIAGNOSIS — D509 Iron deficiency anemia, unspecified: Secondary | ICD-10-CM | POA: Diagnosis not present

## 2020-04-12 DIAGNOSIS — D631 Anemia in chronic kidney disease: Secondary | ICD-10-CM | POA: Diagnosis not present

## 2020-04-12 DIAGNOSIS — D689 Coagulation defect, unspecified: Secondary | ICD-10-CM | POA: Diagnosis not present

## 2020-04-12 DIAGNOSIS — Z992 Dependence on renal dialysis: Secondary | ICD-10-CM | POA: Diagnosis not present

## 2020-04-14 DIAGNOSIS — D631 Anemia in chronic kidney disease: Secondary | ICD-10-CM | POA: Diagnosis not present

## 2020-04-14 DIAGNOSIS — N186 End stage renal disease: Secondary | ICD-10-CM | POA: Diagnosis not present

## 2020-04-14 DIAGNOSIS — D509 Iron deficiency anemia, unspecified: Secondary | ICD-10-CM | POA: Diagnosis not present

## 2020-04-14 DIAGNOSIS — Z23 Encounter for immunization: Secondary | ICD-10-CM | POA: Diagnosis not present

## 2020-04-14 DIAGNOSIS — T8249XD Other complication of vascular dialysis catheter, subsequent encounter: Secondary | ICD-10-CM | POA: Diagnosis not present

## 2020-04-14 DIAGNOSIS — D689 Coagulation defect, unspecified: Secondary | ICD-10-CM | POA: Diagnosis not present

## 2020-04-14 DIAGNOSIS — Z992 Dependence on renal dialysis: Secondary | ICD-10-CM | POA: Diagnosis not present

## 2020-04-14 DIAGNOSIS — N2581 Secondary hyperparathyroidism of renal origin: Secondary | ICD-10-CM | POA: Diagnosis not present

## 2020-04-15 ENCOUNTER — Other Ambulatory Visit: Payer: Self-pay | Admitting: *Deleted

## 2020-04-15 DIAGNOSIS — C641 Malignant neoplasm of right kidney, except renal pelvis: Secondary | ICD-10-CM | POA: Diagnosis not present

## 2020-04-15 DIAGNOSIS — D4102 Neoplasm of uncertain behavior of left kidney: Secondary | ICD-10-CM | POA: Diagnosis not present

## 2020-04-15 NOTE — Patient Outreach (Signed)
[facility]) Care Management  [place]  04/15/2020   Paul Price 1940-12-30 [id number]  Subjective: Telephone assessment for follow up ESRD.  Encounter Medications:  Outpatient Encounter Medications as of 04/15/2020  Medication Sig  . acetaminophen (TYLENOL) 500 MG tablet Take 500-1,000 mg by mouth every 6 (six) hours as needed for mild pain, moderate pain or headache.   . allopurinol (ZYLOPRIM) 300 MG tablet Take 300 mg by mouth daily.  Marland Kitchen amLODipine (NORVASC) 5 MG tablet TAKE 1 AND 1/2 TABLETS BY MOUTH ONCE DAILY (Patient taking differently: Take 7.5 mg by mouth daily.)  . apixaban (ELIQUIS) 5 MG TABS tablet Take 5 mg by mouth 2 (two) times daily.  Marland Kitchen atorvastatin (LIPITOR) 20 MG tablet Take 20 mg by mouth daily.  . B Complex-C (B-COMPLEX WITH VITAMIN C) tablet Take 1 tablet by mouth daily.  Marland Kitchen docusate sodium (COLACE) 100 MG capsule TAKE 2 CAPSULES (200 MG TOTAL) BY MOUTH DAILY.  . ferrous sulfate 325 (65 FE) MG tablet Take by mouth as needed. During dialysis  . Methoxy PEG-Epoetin Beta (MIRCERA IJ) as needed (during diaysis).  . metoprolol succinate (TOPROL-XL) 25 MG 24 hr tablet Take 25 mg by mouth daily.  Marland Kitchen oxyCODONE-acetaminophen (PERCOCET) 5-325 MG tablet Take 1 tablet by mouth every 6 (six) hours as needed.  . pantoprazole (PROTONIX) 40 MG tablet Take 40 mg by mouth daily.  . Tamsulosin HCl (FLOMAX) 0.4 MG CAPS Take 1 capsule (0.4 mg total) by mouth daily.   No facility-administered encounter medications on file as of 04/15/2020.   Functional Status Survey: Is the patient deaf or have difficulty hearing?: No Does the patient have difficulty seeing, even when wearing glasses/contacts?: No Does the patient have difficulty concentrating, remembering, or making decisions?: No Does the patient have difficulty walking or climbing stairs?: Yes Does the patient have difficulty dressing or bathing?: No Does the patient have difficulty doing errands alone such as  visiting a doctor's office or shopping?: Yes  Fall/Depression Screening: Fall Risk  03/05/2020 03/12/2014 02/10/2014  Falls in the past year? 0 No No  Number falls in past yr: 0 - -  Injury with Fall? 0 - -  Risk for fall due to : Impaired balance/gait - -  Risk for fall due to: Comment Due to deconditioning but PT has closed his case. 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 Advanced Care Planning complete by the end of May.       04/15/20 Short term goal Expected end date: 06/07/20  Notes: 04/15/20 Pt states Paul Price has Advanced directives but they are not in the [place]. Requested that Paul Price provide his MD a copy to be put into his cart. Paul Price also states at this time in his life Paul Price would want full resuscitative measures.     . COMPLETED: Follow My Treatment Plan-Chronic Kidney as evidenced by no complications reported over the next 30 days.       Timeframe:  Short-Term Goal Priority:  High Start Date:  02/25/20                          Expected End Date:     04/08/20                  Follow Up Date 04/08/20   Assess how is dialysis going? Any questions? Find out next steps. Any problems and do you know what to do?  Why is this important?    Staying as healthy as you can is very important. This may mean making changes if you smoke, don't exercise or eat poorly.   A healthy lifestyle is an important goal for you.   Following the treatment plan and making changes may be hard.   Try some of these steps to help keep the disease from getting worse.     Notes: 03/06/20 new to dialysis this month, no problems so far and is reporting feeling better as Paul Price is getting used to the txs. Reinforced early outreach for medical advice to avoid complications.  03/05/20 Wife reports dialysis is going well. They are both getting used to the routine. Life is more busy that ever with dialysis, [place] visits and other MD visits. Will have new fistula placed. No problems this week and they know to call the nephrology office if there are problems. 03/12/20 Acceptance of dialysis routine now and improved self care. No questions today. No complications. Reinforced early access to care at the onset of medical problem. 04/15/20 Paul Price is doing well in general. Paul Price has not had any complications. Paul Price has the routine down now. Paul Price has surgery for graft/fistula placement 03/30/20 and it is healing. They are still using the subclavian access for his dialysis at this time. His hgb is climbing 7.0-7.2-current 7.4. Paul Price does feel weak, especially his R leg. Paul Price is using a cane. Paul Price is walking a lot every day. Paul Price has hopes of feeling much stronger. Paul Price is following his medication and diet regimen. No edema. Paul Price does feel washed out after dialysis and usually goes to bed early on those days.    . Manage My Diet as evidenced by stable wt,  no fluid overload (edema) and normal electrolyte panels over the next 3 months.       Timeframe:  Long-Range Goal Priority:  High Start Date:    02/25/20                         Expected End Date:   06/08/20                    Follow Up Date 04/08/20    Assess self care: diet, activity   Why is this important?    A healthy diet is important for mental and physical health.   Healthy food helps repair damaged body tissue and maintains strong bones and muscles.   No single food is just right so eating a variety of proteins, fruits, vegetables and grains is best.   You may need to change what you eat or drink to manage kidney disease.   A dietitian is the best person to guide you.     Notes: 02/25/20 Current wt is 172#. Has had dietary consultation and is following recommended diet. 03/05/20 They have been successful in their learning about his new diet  restrictions. Paul Price are invested in making the necessary changes. They have meet with a dietician. They  know to read labels choose low salt, fat and sugar contents. They are weighing daily, not change in his wt. Edema is much less prevalent now. Praised for efforts and commitment to new lifestyle changes and monitoring habits! 03/12/20 Wt loss of 12# due to fluid wt loss, initial loss of appetite with initiation of dialysis. Discussed healthy diet which they are working on. Pt is drinking nutritional supplement daily. No edema. Discussed choosing heart healthy choices when eating out. 04/15/20 Following his diet. Has had dietary counseling and Paul Price is trying to follow as closely as possible. Paul Price has been allowed to also drink an Ensure daily. His wt is down to 152#. Paul Price denies any edema. Limiting fluids to 32 oz per day.        Plan: We agreed to talk again in one month.  Follow-up:  Patient agrees to Care Plan and Follow-up.   [name]. [name], MSN, [facility] Gerontological Nurse Practitioner [facility] Care Management [phone]

## 2020-04-16 DIAGNOSIS — D509 Iron deficiency anemia, unspecified: Secondary | ICD-10-CM | POA: Diagnosis not present

## 2020-04-16 DIAGNOSIS — Z23 Encounter for immunization: Secondary | ICD-10-CM | POA: Diagnosis not present

## 2020-04-16 DIAGNOSIS — Z992 Dependence on renal dialysis: Secondary | ICD-10-CM | POA: Diagnosis not present

## 2020-04-16 DIAGNOSIS — N2581 Secondary hyperparathyroidism of renal origin: Secondary | ICD-10-CM | POA: Diagnosis not present

## 2020-04-16 DIAGNOSIS — D689 Coagulation defect, unspecified: Secondary | ICD-10-CM | POA: Diagnosis not present

## 2020-04-16 DIAGNOSIS — T8249XD Other complication of vascular dialysis catheter, subsequent encounter: Secondary | ICD-10-CM | POA: Diagnosis not present

## 2020-04-16 DIAGNOSIS — N186 End stage renal disease: Secondary | ICD-10-CM | POA: Diagnosis not present

## 2020-04-16 DIAGNOSIS — D631 Anemia in chronic kidney disease: Secondary | ICD-10-CM | POA: Diagnosis not present

## 2020-04-19 DIAGNOSIS — Z992 Dependence on renal dialysis: Secondary | ICD-10-CM | POA: Diagnosis not present

## 2020-04-19 DIAGNOSIS — D689 Coagulation defect, unspecified: Secondary | ICD-10-CM | POA: Diagnosis not present

## 2020-04-19 DIAGNOSIS — D509 Iron deficiency anemia, unspecified: Secondary | ICD-10-CM | POA: Diagnosis not present

## 2020-04-19 DIAGNOSIS — D631 Anemia in chronic kidney disease: Secondary | ICD-10-CM | POA: Diagnosis not present

## 2020-04-19 DIAGNOSIS — N186 End stage renal disease: Secondary | ICD-10-CM | POA: Diagnosis not present

## 2020-04-19 DIAGNOSIS — N2581 Secondary hyperparathyroidism of renal origin: Secondary | ICD-10-CM | POA: Diagnosis not present

## 2020-04-19 DIAGNOSIS — T8249XD Other complication of vascular dialysis catheter, subsequent encounter: Secondary | ICD-10-CM | POA: Diagnosis not present

## 2020-04-21 DIAGNOSIS — N2581 Secondary hyperparathyroidism of renal origin: Secondary | ICD-10-CM | POA: Diagnosis not present

## 2020-04-21 DIAGNOSIS — D689 Coagulation defect, unspecified: Secondary | ICD-10-CM | POA: Diagnosis not present

## 2020-04-21 DIAGNOSIS — D631 Anemia in chronic kidney disease: Secondary | ICD-10-CM | POA: Diagnosis not present

## 2020-04-21 DIAGNOSIS — Z992 Dependence on renal dialysis: Secondary | ICD-10-CM | POA: Diagnosis not present

## 2020-04-21 DIAGNOSIS — T8249XD Other complication of vascular dialysis catheter, subsequent encounter: Secondary | ICD-10-CM | POA: Diagnosis not present

## 2020-04-21 DIAGNOSIS — D509 Iron deficiency anemia, unspecified: Secondary | ICD-10-CM | POA: Diagnosis not present

## 2020-04-21 DIAGNOSIS — N186 End stage renal disease: Secondary | ICD-10-CM | POA: Diagnosis not present

## 2020-04-23 ENCOUNTER — Telehealth: Payer: Self-pay

## 2020-04-23 ENCOUNTER — Ambulatory Visit (INDEPENDENT_AMBULATORY_CARE_PROVIDER_SITE_OTHER): Payer: Medicare Other

## 2020-04-23 DIAGNOSIS — N2581 Secondary hyperparathyroidism of renal origin: Secondary | ICD-10-CM | POA: Diagnosis not present

## 2020-04-23 DIAGNOSIS — D509 Iron deficiency anemia, unspecified: Secondary | ICD-10-CM | POA: Diagnosis not present

## 2020-04-23 DIAGNOSIS — I441 Atrioventricular block, second degree: Secondary | ICD-10-CM

## 2020-04-23 DIAGNOSIS — D631 Anemia in chronic kidney disease: Secondary | ICD-10-CM | POA: Diagnosis not present

## 2020-04-23 DIAGNOSIS — D689 Coagulation defect, unspecified: Secondary | ICD-10-CM | POA: Diagnosis not present

## 2020-04-23 DIAGNOSIS — T8249XD Other complication of vascular dialysis catheter, subsequent encounter: Secondary | ICD-10-CM | POA: Diagnosis not present

## 2020-04-23 DIAGNOSIS — Z992 Dependence on renal dialysis: Secondary | ICD-10-CM | POA: Diagnosis not present

## 2020-04-23 DIAGNOSIS — N186 End stage renal disease: Secondary | ICD-10-CM | POA: Diagnosis not present

## 2020-04-23 NOTE — Telephone Encounter (Signed)
I tried to help the patient send a transmission. He asked could he call back when his granddaughter is with him. I gave him [employer] tech support number to get additional help.

## 2020-04-26 DIAGNOSIS — N2581 Secondary hyperparathyroidism of renal origin: Secondary | ICD-10-CM | POA: Diagnosis not present

## 2020-04-26 DIAGNOSIS — D689 Coagulation defect, unspecified: Secondary | ICD-10-CM | POA: Diagnosis not present

## 2020-04-26 DIAGNOSIS — T8249XD Other complication of vascular dialysis catheter, subsequent encounter: Secondary | ICD-10-CM | POA: Diagnosis not present

## 2020-04-26 DIAGNOSIS — Z992 Dependence on renal dialysis: Secondary | ICD-10-CM | POA: Diagnosis not present

## 2020-04-26 DIAGNOSIS — N186 End stage renal disease: Secondary | ICD-10-CM | POA: Diagnosis not present

## 2020-04-26 NOTE — Telephone Encounter (Signed)
Contacted patient. Patient states that his granddaughter is going to set up his phone application this afternoon. Paul Price states that is he has any issues, he will contact the [facility] at [phone]. He also has the Tech support number to [employer] [phone].

## 2020-04-27 LAB — CUP PACEART REMOTE DEVICE CHECK
Battery Remaining Longevity: 69 mo
Battery Voltage: 2.9 V
Brady Statistic AP VP Percent: 40.07 %
Brady Statistic AP VS Percent: 0.08 %
Brady Statistic AS VP Percent: 58.55 %
Brady Statistic AS VS Percent: 1.27 %
Brady Statistic RA Percent Paced: 37.84 %
Brady Statistic RV Percent Paced: 98.68 %
Date Time Interrogation Session: 20220418170807
Implantable Lead Implant Date: 20181217
Implantable Lead Implant Date: 20181217
Implantable Lead Location: 753859
Implantable Lead Location: 753859
Implantable Lead Model: 3830
Implantable Lead Model: 5076
Implantable Pulse Generator Implant Date: 20181217
Lead Channel Impedance Value: 285 Ohm
Lead Channel Impedance Value: 323 Ohm
Lead Channel Impedance Value: 361 Ohm
Lead Channel Impedance Value: 399 Ohm
Lead Channel Pacing Threshold Amplitude: 0.5 V
Lead Channel Pacing Threshold Amplitude: 0.75 V
Lead Channel Pacing Threshold Pulse Width: 0.4 ms
Lead Channel Pacing Threshold Pulse Width: 0.4 ms
Lead Channel Sensing Intrinsic Amplitude: 0.375 mV
Lead Channel Sensing Intrinsic Amplitude: 0.375 mV
Lead Channel Sensing Intrinsic Amplitude: 10.875 mV
Lead Channel Sensing Intrinsic Amplitude: 10.875 mV
Lead Channel Setting Pacing Amplitude: 2 V
Lead Channel Setting Pacing Amplitude: 2.5 V
Lead Channel Setting Pacing Pulse Width: 1 ms
Lead Channel Setting Sensing Sensitivity: 1.2 mV

## 2020-04-28 DIAGNOSIS — N186 End stage renal disease: Secondary | ICD-10-CM | POA: Diagnosis not present

## 2020-04-28 DIAGNOSIS — T8249XD Other complication of vascular dialysis catheter, subsequent encounter: Secondary | ICD-10-CM | POA: Diagnosis not present

## 2020-04-28 DIAGNOSIS — Z992 Dependence on renal dialysis: Secondary | ICD-10-CM | POA: Diagnosis not present

## 2020-04-28 DIAGNOSIS — D689 Coagulation defect, unspecified: Secondary | ICD-10-CM | POA: Diagnosis not present

## 2020-04-28 DIAGNOSIS — N2581 Secondary hyperparathyroidism of renal origin: Secondary | ICD-10-CM | POA: Diagnosis not present

## 2020-04-29 ENCOUNTER — Other Ambulatory Visit: Payer: Self-pay

## 2020-04-29 ENCOUNTER — Encounter: Payer: Self-pay | Admitting: Physician Assistant

## 2020-04-29 ENCOUNTER — Ambulatory Visit (INDEPENDENT_AMBULATORY_CARE_PROVIDER_SITE_OTHER): Payer: Medicare Other | Admitting: Physician Assistant

## 2020-04-29 VITALS — BP 141/69 | HR 76 | Temp 98.2°F | Resp 20 | Ht 66.0 in | Wt 161.4 lb

## 2020-04-29 DIAGNOSIS — N529 Male erectile dysfunction, unspecified: Secondary | ICD-10-CM | POA: Insufficient documentation

## 2020-04-29 DIAGNOSIS — Z8673 Personal history of transient ischemic attack (TIA), and cerebral infarction without residual deficits: Secondary | ICD-10-CM | POA: Insufficient documentation

## 2020-04-29 DIAGNOSIS — N186 End stage renal disease: Secondary | ICD-10-CM

## 2020-04-29 DIAGNOSIS — M4802 Spinal stenosis, cervical region: Secondary | ICD-10-CM | POA: Insufficient documentation

## 2020-04-29 DIAGNOSIS — R5383 Other fatigue: Secondary | ICD-10-CM | POA: Insufficient documentation

## 2020-04-29 DIAGNOSIS — Z7901 Long term (current) use of anticoagulants: Secondary | ICD-10-CM | POA: Insufficient documentation

## 2020-04-29 DIAGNOSIS — J309 Allergic rhinitis, unspecified: Secondary | ICD-10-CM | POA: Insufficient documentation

## 2020-04-29 DIAGNOSIS — M1990 Unspecified osteoarthritis, unspecified site: Secondary | ICD-10-CM | POA: Insufficient documentation

## 2020-04-29 DIAGNOSIS — K5730 Diverticulosis of large intestine without perforation or abscess without bleeding: Secondary | ICD-10-CM | POA: Insufficient documentation

## 2020-04-29 DIAGNOSIS — Z86718 Personal history of other venous thrombosis and embolism: Secondary | ICD-10-CM | POA: Insufficient documentation

## 2020-04-29 DIAGNOSIS — N401 Enlarged prostate with lower urinary tract symptoms: Secondary | ICD-10-CM | POA: Insufficient documentation

## 2020-04-29 DIAGNOSIS — M799 Soft tissue disorder, unspecified: Secondary | ICD-10-CM | POA: Insufficient documentation

## 2020-04-29 DIAGNOSIS — Z09 Encounter for follow-up examination after completed treatment for conditions other than malignant neoplasm: Secondary | ICD-10-CM | POA: Insufficient documentation

## 2020-04-29 DIAGNOSIS — E785 Hyperlipidemia, unspecified: Secondary | ICD-10-CM | POA: Insufficient documentation

## 2020-04-29 DIAGNOSIS — R42 Dizziness and giddiness: Secondary | ICD-10-CM | POA: Insufficient documentation

## 2020-04-29 DIAGNOSIS — M5430 Sciatica, unspecified side: Secondary | ICD-10-CM | POA: Insufficient documentation

## 2020-04-29 DIAGNOSIS — G629 Polyneuropathy, unspecified: Secondary | ICD-10-CM | POA: Insufficient documentation

## 2020-04-29 DIAGNOSIS — R972 Elevated prostate specific antigen [PSA]: Secondary | ICD-10-CM | POA: Insufficient documentation

## 2020-04-29 DIAGNOSIS — Z7982 Long term (current) use of aspirin: Secondary | ICD-10-CM | POA: Insufficient documentation

## 2020-04-29 DIAGNOSIS — R7303 Prediabetes: Secondary | ICD-10-CM | POA: Insufficient documentation

## 2020-04-29 DIAGNOSIS — Z86010 Personal history of colonic polyps: Secondary | ICD-10-CM | POA: Insufficient documentation

## 2020-04-29 DIAGNOSIS — E7801 Familial hypercholesterolemia: Secondary | ICD-10-CM | POA: Insufficient documentation

## 2020-04-29 DIAGNOSIS — I440 Atrioventricular block, first degree: Secondary | ICD-10-CM | POA: Insufficient documentation

## 2020-04-29 DIAGNOSIS — R9431 Abnormal electrocardiogram [ECG] [EKG]: Secondary | ICD-10-CM | POA: Insufficient documentation

## 2020-04-29 NOTE — Progress Notes (Signed)
    Postoperative Access Visit   History of Present Illness   Paul Price is a 80 y.o. year old male who presents for postoperative follow-up for: right second stage basilic vein transposition by Dr. Donzetta Matters  (Date: 03/30/20).  The patient's wounds are healed.  The patient denies steal symptoms.  The patient is able to complete their activities of daily living.  He is currently dialyzing via right IJ [facility] on a Monday Wednesday Friday schedule at the [facility] [place] location under the care of Dr. Hollie Salk.  Physical Examination   Vitals:   04/29/20 0935  BP: (!) 141/69  Pulse: 76  Resp: 20  Temp: 98.2 F (36.8 C)  TempSrc: Temporal  SpO2: 95%  Weight: 161 lb 6.4 oz (73.2 kg)  Height: 5\' 6"  (1.676 m)   Body mass index is 26.05 kg/m.  right arm Incisions are healed, palpable radial pulse, hand grip is 5/5, sensation in digits is intact, palpable thrill, bruit can be auscultated     Medical Decision Making   Paul Price is a 80 y.o. year old male who presents s/p right second stage basilic vein transposition   Patent brachiobasilic fistula without signs or symptoms of steal syndrome  The patient's access will be ready for use Monday 05/03/20  The patient's tunneled dialysis catheter can be removed when Nephrology is comfortable with the performance of the fistula  The patient may follow up on a prn basis   Dagoberto Ligas PA-C [facility] [place] Office: [phone]  Clinic MD: Oneida Alar

## 2020-04-30 DIAGNOSIS — N186 End stage renal disease: Secondary | ICD-10-CM | POA: Diagnosis not present

## 2020-04-30 DIAGNOSIS — T8249XD Other complication of vascular dialysis catheter, subsequent encounter: Secondary | ICD-10-CM | POA: Diagnosis not present

## 2020-04-30 DIAGNOSIS — D689 Coagulation defect, unspecified: Secondary | ICD-10-CM | POA: Diagnosis not present

## 2020-04-30 DIAGNOSIS — N2581 Secondary hyperparathyroidism of renal origin: Secondary | ICD-10-CM | POA: Diagnosis not present

## 2020-04-30 DIAGNOSIS — Z992 Dependence on renal dialysis: Secondary | ICD-10-CM | POA: Diagnosis not present

## 2020-05-03 DIAGNOSIS — Z992 Dependence on renal dialysis: Secondary | ICD-10-CM | POA: Diagnosis not present

## 2020-05-03 DIAGNOSIS — D631 Anemia in chronic kidney disease: Secondary | ICD-10-CM | POA: Diagnosis not present

## 2020-05-03 DIAGNOSIS — N2581 Secondary hyperparathyroidism of renal origin: Secondary | ICD-10-CM | POA: Diagnosis not present

## 2020-05-03 DIAGNOSIS — D689 Coagulation defect, unspecified: Secondary | ICD-10-CM | POA: Diagnosis not present

## 2020-05-03 DIAGNOSIS — D509 Iron deficiency anemia, unspecified: Secondary | ICD-10-CM | POA: Diagnosis not present

## 2020-05-03 DIAGNOSIS — T8249XD Other complication of vascular dialysis catheter, subsequent encounter: Secondary | ICD-10-CM | POA: Diagnosis not present

## 2020-05-03 DIAGNOSIS — N186 End stage renal disease: Secondary | ICD-10-CM | POA: Diagnosis not present

## 2020-05-05 DIAGNOSIS — N186 End stage renal disease: Secondary | ICD-10-CM | POA: Diagnosis not present

## 2020-05-05 DIAGNOSIS — Z992 Dependence on renal dialysis: Secondary | ICD-10-CM | POA: Diagnosis not present

## 2020-05-05 DIAGNOSIS — N2581 Secondary hyperparathyroidism of renal origin: Secondary | ICD-10-CM | POA: Diagnosis not present

## 2020-05-05 DIAGNOSIS — D689 Coagulation defect, unspecified: Secondary | ICD-10-CM | POA: Diagnosis not present

## 2020-05-05 DIAGNOSIS — T8249XD Other complication of vascular dialysis catheter, subsequent encounter: Secondary | ICD-10-CM | POA: Diagnosis not present

## 2020-05-05 DIAGNOSIS — D509 Iron deficiency anemia, unspecified: Secondary | ICD-10-CM | POA: Diagnosis not present

## 2020-05-05 DIAGNOSIS — D631 Anemia in chronic kidney disease: Secondary | ICD-10-CM | POA: Diagnosis not present

## 2020-05-07 DIAGNOSIS — N2581 Secondary hyperparathyroidism of renal origin: Secondary | ICD-10-CM | POA: Diagnosis not present

## 2020-05-07 DIAGNOSIS — D689 Coagulation defect, unspecified: Secondary | ICD-10-CM | POA: Diagnosis not present

## 2020-05-07 DIAGNOSIS — N186 End stage renal disease: Secondary | ICD-10-CM | POA: Diagnosis not present

## 2020-05-07 DIAGNOSIS — Z992 Dependence on renal dialysis: Secondary | ICD-10-CM | POA: Diagnosis not present

## 2020-05-07 DIAGNOSIS — D631 Anemia in chronic kidney disease: Secondary | ICD-10-CM | POA: Diagnosis not present

## 2020-05-07 DIAGNOSIS — D509 Iron deficiency anemia, unspecified: Secondary | ICD-10-CM | POA: Diagnosis not present

## 2020-05-07 DIAGNOSIS — T8249XD Other complication of vascular dialysis catheter, subsequent encounter: Secondary | ICD-10-CM | POA: Diagnosis not present

## 2020-05-08 DIAGNOSIS — I129 Hypertensive chronic kidney disease with stage 1 through stage 4 chronic kidney disease, or unspecified chronic kidney disease: Secondary | ICD-10-CM | POA: Diagnosis not present

## 2020-05-08 DIAGNOSIS — Z992 Dependence on renal dialysis: Secondary | ICD-10-CM | POA: Diagnosis not present

## 2020-05-08 DIAGNOSIS — N186 End stage renal disease: Secondary | ICD-10-CM | POA: Diagnosis not present

## 2020-05-10 DIAGNOSIS — Z23 Encounter for immunization: Secondary | ICD-10-CM | POA: Diagnosis not present

## 2020-05-10 DIAGNOSIS — D509 Iron deficiency anemia, unspecified: Secondary | ICD-10-CM | POA: Diagnosis not present

## 2020-05-10 DIAGNOSIS — N186 End stage renal disease: Secondary | ICD-10-CM | POA: Diagnosis not present

## 2020-05-10 DIAGNOSIS — T8249XD Other complication of vascular dialysis catheter, subsequent encounter: Secondary | ICD-10-CM | POA: Diagnosis not present

## 2020-05-10 DIAGNOSIS — D689 Coagulation defect, unspecified: Secondary | ICD-10-CM | POA: Diagnosis not present

## 2020-05-10 DIAGNOSIS — N2581 Secondary hyperparathyroidism of renal origin: Secondary | ICD-10-CM | POA: Diagnosis not present

## 2020-05-10 DIAGNOSIS — D631 Anemia in chronic kidney disease: Secondary | ICD-10-CM | POA: Diagnosis not present

## 2020-05-10 DIAGNOSIS — Z992 Dependence on renal dialysis: Secondary | ICD-10-CM | POA: Diagnosis not present

## 2020-05-11 NOTE — Progress Notes (Signed)
Remote pacemaker transmission.   

## 2020-05-12 DIAGNOSIS — Z992 Dependence on renal dialysis: Secondary | ICD-10-CM | POA: Diagnosis not present

## 2020-05-12 DIAGNOSIS — N186 End stage renal disease: Secondary | ICD-10-CM | POA: Diagnosis not present

## 2020-05-12 DIAGNOSIS — N2581 Secondary hyperparathyroidism of renal origin: Secondary | ICD-10-CM | POA: Diagnosis not present

## 2020-05-12 DIAGNOSIS — D631 Anemia in chronic kidney disease: Secondary | ICD-10-CM | POA: Diagnosis not present

## 2020-05-12 DIAGNOSIS — D509 Iron deficiency anemia, unspecified: Secondary | ICD-10-CM | POA: Diagnosis not present

## 2020-05-12 DIAGNOSIS — T8249XD Other complication of vascular dialysis catheter, subsequent encounter: Secondary | ICD-10-CM | POA: Diagnosis not present

## 2020-05-12 DIAGNOSIS — Z23 Encounter for immunization: Secondary | ICD-10-CM | POA: Diagnosis not present

## 2020-05-12 DIAGNOSIS — D689 Coagulation defect, unspecified: Secondary | ICD-10-CM | POA: Diagnosis not present

## 2020-05-13 ENCOUNTER — Other Ambulatory Visit: Payer: Self-pay

## 2020-05-13 ENCOUNTER — Other Ambulatory Visit: Payer: Self-pay | Admitting: *Deleted

## 2020-05-13 NOTE — Patient Outreach (Signed)
[facility]) Care Management  05/13/2020  Paul Price [birth year] [id number]    Telephone outreach, however, [name] requests to reschedule for next week. Will call him next Thursday. He does say he is doing fine at the moment.  [name]. [name], MSN, [facility] Gerontological Nurse Practitioner [facility] Care Management [phone]

## 2020-05-14 DIAGNOSIS — Z992 Dependence on renal dialysis: Secondary | ICD-10-CM | POA: Diagnosis not present

## 2020-05-14 DIAGNOSIS — D689 Coagulation defect, unspecified: Secondary | ICD-10-CM | POA: Diagnosis not present

## 2020-05-14 DIAGNOSIS — N186 End stage renal disease: Secondary | ICD-10-CM | POA: Diagnosis not present

## 2020-05-14 DIAGNOSIS — T8249XD Other complication of vascular dialysis catheter, subsequent encounter: Secondary | ICD-10-CM | POA: Diagnosis not present

## 2020-05-14 DIAGNOSIS — N2581 Secondary hyperparathyroidism of renal origin: Secondary | ICD-10-CM | POA: Diagnosis not present

## 2020-05-14 DIAGNOSIS — D631 Anemia in chronic kidney disease: Secondary | ICD-10-CM | POA: Diagnosis not present

## 2020-05-14 DIAGNOSIS — Z23 Encounter for immunization: Secondary | ICD-10-CM | POA: Diagnosis not present

## 2020-05-14 DIAGNOSIS — D509 Iron deficiency anemia, unspecified: Secondary | ICD-10-CM | POA: Diagnosis not present

## 2020-05-17 DIAGNOSIS — E876 Hypokalemia: Secondary | ICD-10-CM | POA: Diagnosis not present

## 2020-05-17 DIAGNOSIS — D689 Coagulation defect, unspecified: Secondary | ICD-10-CM | POA: Diagnosis not present

## 2020-05-17 DIAGNOSIS — D631 Anemia in chronic kidney disease: Secondary | ICD-10-CM | POA: Diagnosis not present

## 2020-05-17 DIAGNOSIS — D509 Iron deficiency anemia, unspecified: Secondary | ICD-10-CM | POA: Diagnosis not present

## 2020-05-17 DIAGNOSIS — T8249XD Other complication of vascular dialysis catheter, subsequent encounter: Secondary | ICD-10-CM | POA: Diagnosis not present

## 2020-05-17 DIAGNOSIS — Z992 Dependence on renal dialysis: Secondary | ICD-10-CM | POA: Diagnosis not present

## 2020-05-17 DIAGNOSIS — N186 End stage renal disease: Secondary | ICD-10-CM | POA: Diagnosis not present

## 2020-05-17 DIAGNOSIS — N2581 Secondary hyperparathyroidism of renal origin: Secondary | ICD-10-CM | POA: Diagnosis not present

## 2020-05-18 ENCOUNTER — Telehealth: Payer: Self-pay

## 2020-05-18 NOTE — Telephone Encounter (Signed)
Pt left voicemail requesting appointment for [facility] to be removed. Attempted to reach pt to discuss and advise pt that request would need to come from dialysis center directly. Received message stating calls are being screened and call was not connected.

## 2020-05-19 DIAGNOSIS — T8249XD Other complication of vascular dialysis catheter, subsequent encounter: Secondary | ICD-10-CM | POA: Diagnosis not present

## 2020-05-19 DIAGNOSIS — N2581 Secondary hyperparathyroidism of renal origin: Secondary | ICD-10-CM | POA: Diagnosis not present

## 2020-05-19 DIAGNOSIS — D509 Iron deficiency anemia, unspecified: Secondary | ICD-10-CM | POA: Diagnosis not present

## 2020-05-19 DIAGNOSIS — D689 Coagulation defect, unspecified: Secondary | ICD-10-CM | POA: Diagnosis not present

## 2020-05-19 DIAGNOSIS — N186 End stage renal disease: Secondary | ICD-10-CM | POA: Diagnosis not present

## 2020-05-19 DIAGNOSIS — E876 Hypokalemia: Secondary | ICD-10-CM | POA: Diagnosis not present

## 2020-05-19 DIAGNOSIS — D631 Anemia in chronic kidney disease: Secondary | ICD-10-CM | POA: Diagnosis not present

## 2020-05-19 DIAGNOSIS — Z992 Dependence on renal dialysis: Secondary | ICD-10-CM | POA: Diagnosis not present

## 2020-05-20 ENCOUNTER — Other Ambulatory Visit: Payer: Self-pay

## 2020-05-20 ENCOUNTER — Other Ambulatory Visit: Payer: Self-pay | Admitting: *Deleted

## 2020-05-20 DIAGNOSIS — Z992 Dependence on renal dialysis: Secondary | ICD-10-CM | POA: Diagnosis not present

## 2020-05-20 DIAGNOSIS — Z452 Encounter for adjustment and management of vascular access device: Secondary | ICD-10-CM | POA: Diagnosis not present

## 2020-05-20 DIAGNOSIS — G4733 Obstructive sleep apnea (adult) (pediatric): Secondary | ICD-10-CM | POA: Diagnosis not present

## 2020-05-20 DIAGNOSIS — N186 End stage renal disease: Secondary | ICD-10-CM | POA: Diagnosis not present

## 2020-05-21 DIAGNOSIS — D631 Anemia in chronic kidney disease: Secondary | ICD-10-CM | POA: Diagnosis not present

## 2020-05-21 DIAGNOSIS — N186 End stage renal disease: Secondary | ICD-10-CM | POA: Diagnosis not present

## 2020-05-21 DIAGNOSIS — D509 Iron deficiency anemia, unspecified: Secondary | ICD-10-CM | POA: Diagnosis not present

## 2020-05-21 DIAGNOSIS — D689 Coagulation defect, unspecified: Secondary | ICD-10-CM | POA: Diagnosis not present

## 2020-05-21 DIAGNOSIS — Z992 Dependence on renal dialysis: Secondary | ICD-10-CM | POA: Diagnosis not present

## 2020-05-21 DIAGNOSIS — N2581 Secondary hyperparathyroidism of renal origin: Secondary | ICD-10-CM | POA: Diagnosis not present

## 2020-05-21 DIAGNOSIS — T8249XD Other complication of vascular dialysis catheter, subsequent encounter: Secondary | ICD-10-CM | POA: Diagnosis not present

## 2020-05-21 DIAGNOSIS — E876 Hypokalemia: Secondary | ICD-10-CM | POA: Diagnosis not present

## 2020-05-21 NOTE — Patient Outreach (Signed)
[facility]) Care Management  05/21/2020  Paul Price November 29, 1940 [id number]  Called 5/12 and today 5/13 but unable to contact pt. Left message to return call and have scheduled another call for next Tuesday.  [name]. [name], MSN, [facility] Gerontological Nurse Practitioner [facility] Care Management [phone]

## 2020-05-24 DIAGNOSIS — E876 Hypokalemia: Secondary | ICD-10-CM | POA: Diagnosis not present

## 2020-05-24 DIAGNOSIS — Z23 Encounter for immunization: Secondary | ICD-10-CM | POA: Diagnosis not present

## 2020-05-24 DIAGNOSIS — D509 Iron deficiency anemia, unspecified: Secondary | ICD-10-CM | POA: Diagnosis not present

## 2020-05-24 DIAGNOSIS — Z992 Dependence on renal dialysis: Secondary | ICD-10-CM | POA: Diagnosis not present

## 2020-05-24 DIAGNOSIS — N2581 Secondary hyperparathyroidism of renal origin: Secondary | ICD-10-CM | POA: Diagnosis not present

## 2020-05-24 DIAGNOSIS — D631 Anemia in chronic kidney disease: Secondary | ICD-10-CM | POA: Diagnosis not present

## 2020-05-24 DIAGNOSIS — N186 End stage renal disease: Secondary | ICD-10-CM | POA: Diagnosis not present

## 2020-05-25 ENCOUNTER — Other Ambulatory Visit: Payer: Self-pay | Admitting: *Deleted

## 2020-05-26 DIAGNOSIS — Z23 Encounter for immunization: Secondary | ICD-10-CM | POA: Diagnosis not present

## 2020-05-26 DIAGNOSIS — D631 Anemia in chronic kidney disease: Secondary | ICD-10-CM | POA: Diagnosis not present

## 2020-05-26 DIAGNOSIS — E876 Hypokalemia: Secondary | ICD-10-CM | POA: Diagnosis not present

## 2020-05-26 DIAGNOSIS — N186 End stage renal disease: Secondary | ICD-10-CM | POA: Diagnosis not present

## 2020-05-26 DIAGNOSIS — Z992 Dependence on renal dialysis: Secondary | ICD-10-CM | POA: Diagnosis not present

## 2020-05-26 DIAGNOSIS — N2581 Secondary hyperparathyroidism of renal origin: Secondary | ICD-10-CM | POA: Diagnosis not present

## 2020-05-26 DIAGNOSIS — D509 Iron deficiency anemia, unspecified: Secondary | ICD-10-CM | POA: Diagnosis not present

## 2020-05-27 ENCOUNTER — Ambulatory Visit (HOSPITAL_COMMUNITY)
Admission: RE | Admit: 2020-05-27 | Discharge: 2020-05-27 | Disposition: A | Discharge status: Home / Self Care | Payer: Medicare Other | Source: Ambulatory Visit | Attending: Urology | Admitting: Urology

## 2020-05-27 ENCOUNTER — Other Ambulatory Visit: Payer: Self-pay

## 2020-05-27 ENCOUNTER — Other Ambulatory Visit (HOSPITAL_COMMUNITY): Payer: Self-pay | Admitting: Urology

## 2020-05-27 DIAGNOSIS — I517 Cardiomegaly: Secondary | ICD-10-CM | POA: Diagnosis not present

## 2020-05-27 DIAGNOSIS — C641 Malignant neoplasm of right kidney, except renal pelvis: Secondary | ICD-10-CM

## 2020-05-27 DIAGNOSIS — E278 Other specified disorders of adrenal gland: Secondary | ICD-10-CM | POA: Diagnosis not present

## 2020-05-27 DIAGNOSIS — N281 Cyst of kidney, acquired: Secondary | ICD-10-CM | POA: Diagnosis not present

## 2020-05-27 DIAGNOSIS — N2889 Other specified disorders of kidney and ureter: Secondary | ICD-10-CM | POA: Diagnosis not present

## 2020-05-27 DIAGNOSIS — K8020 Calculus of gallbladder without cholecystitis without obstruction: Secondary | ICD-10-CM | POA: Diagnosis not present

## 2020-05-28 DIAGNOSIS — Z992 Dependence on renal dialysis: Secondary | ICD-10-CM | POA: Diagnosis not present

## 2020-05-28 DIAGNOSIS — D631 Anemia in chronic kidney disease: Secondary | ICD-10-CM | POA: Diagnosis not present

## 2020-05-28 DIAGNOSIS — N2581 Secondary hyperparathyroidism of renal origin: Secondary | ICD-10-CM | POA: Diagnosis not present

## 2020-05-28 DIAGNOSIS — Z23 Encounter for immunization: Secondary | ICD-10-CM | POA: Diagnosis not present

## 2020-05-28 DIAGNOSIS — N186 End stage renal disease: Secondary | ICD-10-CM | POA: Diagnosis not present

## 2020-05-28 DIAGNOSIS — E876 Hypokalemia: Secondary | ICD-10-CM | POA: Diagnosis not present

## 2020-05-28 DIAGNOSIS — D509 Iron deficiency anemia, unspecified: Secondary | ICD-10-CM | POA: Diagnosis not present

## 2020-05-31 DIAGNOSIS — E876 Hypokalemia: Secondary | ICD-10-CM | POA: Diagnosis not present

## 2020-05-31 DIAGNOSIS — Z992 Dependence on renal dialysis: Secondary | ICD-10-CM | POA: Diagnosis not present

## 2020-05-31 DIAGNOSIS — D509 Iron deficiency anemia, unspecified: Secondary | ICD-10-CM | POA: Diagnosis not present

## 2020-05-31 DIAGNOSIS — N186 End stage renal disease: Secondary | ICD-10-CM | POA: Diagnosis not present

## 2020-05-31 DIAGNOSIS — D631 Anemia in chronic kidney disease: Secondary | ICD-10-CM | POA: Diagnosis not present

## 2020-05-31 DIAGNOSIS — N2581 Secondary hyperparathyroidism of renal origin: Secondary | ICD-10-CM | POA: Diagnosis not present

## 2020-06-01 ENCOUNTER — Other Ambulatory Visit: Payer: Self-pay

## 2020-06-01 ENCOUNTER — Other Ambulatory Visit: Payer: Self-pay | Admitting: *Deleted

## 2020-06-02 DIAGNOSIS — E876 Hypokalemia: Secondary | ICD-10-CM | POA: Diagnosis not present

## 2020-06-02 DIAGNOSIS — N186 End stage renal disease: Secondary | ICD-10-CM | POA: Diagnosis not present

## 2020-06-02 DIAGNOSIS — Z992 Dependence on renal dialysis: Secondary | ICD-10-CM | POA: Diagnosis not present

## 2020-06-02 DIAGNOSIS — D631 Anemia in chronic kidney disease: Secondary | ICD-10-CM | POA: Diagnosis not present

## 2020-06-02 DIAGNOSIS — N2581 Secondary hyperparathyroidism of renal origin: Secondary | ICD-10-CM | POA: Diagnosis not present

## 2020-06-02 DIAGNOSIS — D509 Iron deficiency anemia, unspecified: Secondary | ICD-10-CM | POA: Diagnosis not present

## 2020-06-02 NOTE — Patient Outreach (Signed)
[facility]) Care Management  06/01/20  LINDSAY SOULLIERE 07/06/1940 [id number]  Telephone outreach, unsuccessful. Have called several times previously, also unsuccessful. Will send unsuccessful letter.  [name]. [name], MSN, [facility] Gerontological Nurse Practitioner [facility] Care Management [phone]

## 2020-06-03 ENCOUNTER — Other Ambulatory Visit: Payer: Self-pay | Admitting: Internal Medicine

## 2020-06-04 DIAGNOSIS — N2581 Secondary hyperparathyroidism of renal origin: Secondary | ICD-10-CM | POA: Diagnosis not present

## 2020-06-04 DIAGNOSIS — D631 Anemia in chronic kidney disease: Secondary | ICD-10-CM | POA: Diagnosis not present

## 2020-06-04 DIAGNOSIS — Z992 Dependence on renal dialysis: Secondary | ICD-10-CM | POA: Diagnosis not present

## 2020-06-04 DIAGNOSIS — E876 Hypokalemia: Secondary | ICD-10-CM | POA: Diagnosis not present

## 2020-06-04 DIAGNOSIS — D509 Iron deficiency anemia, unspecified: Secondary | ICD-10-CM | POA: Diagnosis not present

## 2020-06-04 DIAGNOSIS — N186 End stage renal disease: Secondary | ICD-10-CM | POA: Diagnosis not present

## 2020-06-07 DIAGNOSIS — D631 Anemia in chronic kidney disease: Secondary | ICD-10-CM | POA: Diagnosis not present

## 2020-06-07 DIAGNOSIS — N2581 Secondary hyperparathyroidism of renal origin: Secondary | ICD-10-CM | POA: Diagnosis not present

## 2020-06-07 DIAGNOSIS — Z992 Dependence on renal dialysis: Secondary | ICD-10-CM | POA: Diagnosis not present

## 2020-06-07 DIAGNOSIS — D509 Iron deficiency anemia, unspecified: Secondary | ICD-10-CM | POA: Diagnosis not present

## 2020-06-07 DIAGNOSIS — N186 End stage renal disease: Secondary | ICD-10-CM | POA: Diagnosis not present

## 2020-06-08 DIAGNOSIS — N186 End stage renal disease: Secondary | ICD-10-CM | POA: Diagnosis not present

## 2020-06-08 DIAGNOSIS — I129 Hypertensive chronic kidney disease with stage 1 through stage 4 chronic kidney disease, or unspecified chronic kidney disease: Secondary | ICD-10-CM | POA: Diagnosis not present

## 2020-06-08 DIAGNOSIS — Z992 Dependence on renal dialysis: Secondary | ICD-10-CM | POA: Diagnosis not present

## 2020-06-09 DIAGNOSIS — D509 Iron deficiency anemia, unspecified: Secondary | ICD-10-CM | POA: Diagnosis not present

## 2020-06-09 DIAGNOSIS — N2581 Secondary hyperparathyroidism of renal origin: Secondary | ICD-10-CM | POA: Diagnosis not present

## 2020-06-09 DIAGNOSIS — D631 Anemia in chronic kidney disease: Secondary | ICD-10-CM | POA: Diagnosis not present

## 2020-06-09 DIAGNOSIS — Z992 Dependence on renal dialysis: Secondary | ICD-10-CM | POA: Diagnosis not present

## 2020-06-09 DIAGNOSIS — N186 End stage renal disease: Secondary | ICD-10-CM | POA: Diagnosis not present

## 2020-06-09 DIAGNOSIS — Z23 Encounter for immunization: Secondary | ICD-10-CM | POA: Diagnosis not present

## 2020-06-10 DIAGNOSIS — C641 Malignant neoplasm of right kidney, except renal pelvis: Secondary | ICD-10-CM | POA: Diagnosis not present

## 2020-06-10 DIAGNOSIS — D4411 Neoplasm of uncertain behavior of right adrenal gland: Secondary | ICD-10-CM | POA: Diagnosis not present

## 2020-06-11 DIAGNOSIS — Z992 Dependence on renal dialysis: Secondary | ICD-10-CM | POA: Diagnosis not present

## 2020-06-11 DIAGNOSIS — N186 End stage renal disease: Secondary | ICD-10-CM | POA: Diagnosis not present

## 2020-06-11 DIAGNOSIS — D509 Iron deficiency anemia, unspecified: Secondary | ICD-10-CM | POA: Diagnosis not present

## 2020-06-11 DIAGNOSIS — N2581 Secondary hyperparathyroidism of renal origin: Secondary | ICD-10-CM | POA: Diagnosis not present

## 2020-06-11 DIAGNOSIS — D631 Anemia in chronic kidney disease: Secondary | ICD-10-CM | POA: Diagnosis not present

## 2020-06-11 DIAGNOSIS — Z23 Encounter for immunization: Secondary | ICD-10-CM | POA: Diagnosis not present

## 2020-06-14 DIAGNOSIS — N186 End stage renal disease: Secondary | ICD-10-CM | POA: Diagnosis not present

## 2020-06-14 DIAGNOSIS — E876 Hypokalemia: Secondary | ICD-10-CM | POA: Diagnosis not present

## 2020-06-14 DIAGNOSIS — N2581 Secondary hyperparathyroidism of renal origin: Secondary | ICD-10-CM | POA: Diagnosis not present

## 2020-06-14 DIAGNOSIS — Z992 Dependence on renal dialysis: Secondary | ICD-10-CM | POA: Diagnosis not present

## 2020-06-14 DIAGNOSIS — D631 Anemia in chronic kidney disease: Secondary | ICD-10-CM | POA: Diagnosis not present

## 2020-06-16 DIAGNOSIS — Z992 Dependence on renal dialysis: Secondary | ICD-10-CM | POA: Diagnosis not present

## 2020-06-16 DIAGNOSIS — D631 Anemia in chronic kidney disease: Secondary | ICD-10-CM | POA: Diagnosis not present

## 2020-06-16 DIAGNOSIS — N2581 Secondary hyperparathyroidism of renal origin: Secondary | ICD-10-CM | POA: Diagnosis not present

## 2020-06-16 DIAGNOSIS — E876 Hypokalemia: Secondary | ICD-10-CM | POA: Diagnosis not present

## 2020-06-16 DIAGNOSIS — N186 End stage renal disease: Secondary | ICD-10-CM | POA: Diagnosis not present

## 2020-06-17 ENCOUNTER — Other Ambulatory Visit: Payer: Self-pay | Admitting: *Deleted

## 2020-06-18 DIAGNOSIS — E876 Hypokalemia: Secondary | ICD-10-CM | POA: Diagnosis not present

## 2020-06-18 DIAGNOSIS — Z992 Dependence on renal dialysis: Secondary | ICD-10-CM | POA: Diagnosis not present

## 2020-06-18 DIAGNOSIS — N186 End stage renal disease: Secondary | ICD-10-CM | POA: Diagnosis not present

## 2020-06-18 DIAGNOSIS — N2581 Secondary hyperparathyroidism of renal origin: Secondary | ICD-10-CM | POA: Diagnosis not present

## 2020-06-18 DIAGNOSIS — D631 Anemia in chronic kidney disease: Secondary | ICD-10-CM | POA: Diagnosis not present

## 2020-06-21 DIAGNOSIS — Z992 Dependence on renal dialysis: Secondary | ICD-10-CM | POA: Diagnosis not present

## 2020-06-21 DIAGNOSIS — N186 End stage renal disease: Secondary | ICD-10-CM | POA: Diagnosis not present

## 2020-06-21 DIAGNOSIS — N2581 Secondary hyperparathyroidism of renal origin: Secondary | ICD-10-CM | POA: Diagnosis not present

## 2020-06-21 DIAGNOSIS — E876 Hypokalemia: Secondary | ICD-10-CM | POA: Diagnosis not present

## 2020-06-21 DIAGNOSIS — D631 Anemia in chronic kidney disease: Secondary | ICD-10-CM | POA: Diagnosis not present

## 2020-06-23 DIAGNOSIS — N2581 Secondary hyperparathyroidism of renal origin: Secondary | ICD-10-CM | POA: Diagnosis not present

## 2020-06-23 DIAGNOSIS — Z992 Dependence on renal dialysis: Secondary | ICD-10-CM | POA: Diagnosis not present

## 2020-06-23 DIAGNOSIS — N186 End stage renal disease: Secondary | ICD-10-CM | POA: Diagnosis not present

## 2020-06-23 DIAGNOSIS — E876 Hypokalemia: Secondary | ICD-10-CM | POA: Diagnosis not present

## 2020-06-23 DIAGNOSIS — D631 Anemia in chronic kidney disease: Secondary | ICD-10-CM | POA: Diagnosis not present

## 2020-06-25 DIAGNOSIS — N186 End stage renal disease: Secondary | ICD-10-CM | POA: Diagnosis not present

## 2020-06-25 DIAGNOSIS — D631 Anemia in chronic kidney disease: Secondary | ICD-10-CM | POA: Diagnosis not present

## 2020-06-25 DIAGNOSIS — Z992 Dependence on renal dialysis: Secondary | ICD-10-CM | POA: Diagnosis not present

## 2020-06-25 DIAGNOSIS — E876 Hypokalemia: Secondary | ICD-10-CM | POA: Diagnosis not present

## 2020-06-25 DIAGNOSIS — N2581 Secondary hyperparathyroidism of renal origin: Secondary | ICD-10-CM | POA: Diagnosis not present

## 2020-06-26 ENCOUNTER — Other Ambulatory Visit: Payer: Self-pay | Admitting: Cardiovascular Disease

## 2020-06-28 DIAGNOSIS — E876 Hypokalemia: Secondary | ICD-10-CM | POA: Diagnosis not present

## 2020-06-28 DIAGNOSIS — R52 Pain, unspecified: Secondary | ICD-10-CM | POA: Diagnosis not present

## 2020-06-28 DIAGNOSIS — D631 Anemia in chronic kidney disease: Secondary | ICD-10-CM | POA: Diagnosis not present

## 2020-06-28 DIAGNOSIS — N2581 Secondary hyperparathyroidism of renal origin: Secondary | ICD-10-CM | POA: Diagnosis not present

## 2020-06-28 DIAGNOSIS — Z992 Dependence on renal dialysis: Secondary | ICD-10-CM | POA: Diagnosis not present

## 2020-06-28 DIAGNOSIS — N186 End stage renal disease: Secondary | ICD-10-CM | POA: Diagnosis not present

## 2020-06-28 NOTE — Telephone Encounter (Signed)
Last saw Dr Claiborne Billings on 02/19/20. Sees Dr Caryl Comes for EP, he does not manage pt's lipids.

## 2020-06-30 DIAGNOSIS — E876 Hypokalemia: Secondary | ICD-10-CM | POA: Diagnosis not present

## 2020-06-30 DIAGNOSIS — Z992 Dependence on renal dialysis: Secondary | ICD-10-CM | POA: Diagnosis not present

## 2020-06-30 DIAGNOSIS — R52 Pain, unspecified: Secondary | ICD-10-CM | POA: Diagnosis not present

## 2020-06-30 DIAGNOSIS — N2581 Secondary hyperparathyroidism of renal origin: Secondary | ICD-10-CM | POA: Diagnosis not present

## 2020-06-30 DIAGNOSIS — N186 End stage renal disease: Secondary | ICD-10-CM | POA: Diagnosis not present

## 2020-06-30 DIAGNOSIS — D631 Anemia in chronic kidney disease: Secondary | ICD-10-CM | POA: Diagnosis not present

## 2020-07-02 DIAGNOSIS — D631 Anemia in chronic kidney disease: Secondary | ICD-10-CM | POA: Diagnosis not present

## 2020-07-02 DIAGNOSIS — Z992 Dependence on renal dialysis: Secondary | ICD-10-CM | POA: Diagnosis not present

## 2020-07-02 DIAGNOSIS — E876 Hypokalemia: Secondary | ICD-10-CM | POA: Diagnosis not present

## 2020-07-02 DIAGNOSIS — N2581 Secondary hyperparathyroidism of renal origin: Secondary | ICD-10-CM | POA: Diagnosis not present

## 2020-07-02 DIAGNOSIS — N186 End stage renal disease: Secondary | ICD-10-CM | POA: Diagnosis not present

## 2020-07-02 DIAGNOSIS — R52 Pain, unspecified: Secondary | ICD-10-CM | POA: Diagnosis not present

## 2020-07-05 DIAGNOSIS — E876 Hypokalemia: Secondary | ICD-10-CM | POA: Diagnosis not present

## 2020-07-05 DIAGNOSIS — D631 Anemia in chronic kidney disease: Secondary | ICD-10-CM | POA: Diagnosis not present

## 2020-07-05 DIAGNOSIS — Z992 Dependence on renal dialysis: Secondary | ICD-10-CM | POA: Diagnosis not present

## 2020-07-05 DIAGNOSIS — N2581 Secondary hyperparathyroidism of renal origin: Secondary | ICD-10-CM | POA: Diagnosis not present

## 2020-07-05 DIAGNOSIS — N186 End stage renal disease: Secondary | ICD-10-CM | POA: Diagnosis not present

## 2020-07-07 DIAGNOSIS — Z992 Dependence on renal dialysis: Secondary | ICD-10-CM | POA: Diagnosis not present

## 2020-07-07 DIAGNOSIS — N2581 Secondary hyperparathyroidism of renal origin: Secondary | ICD-10-CM | POA: Diagnosis not present

## 2020-07-07 DIAGNOSIS — E876 Hypokalemia: Secondary | ICD-10-CM | POA: Diagnosis not present

## 2020-07-07 DIAGNOSIS — N186 End stage renal disease: Secondary | ICD-10-CM | POA: Diagnosis not present

## 2020-07-07 DIAGNOSIS — D631 Anemia in chronic kidney disease: Secondary | ICD-10-CM | POA: Diagnosis not present

## 2020-07-08 DIAGNOSIS — I129 Hypertensive chronic kidney disease with stage 1 through stage 4 chronic kidney disease, or unspecified chronic kidney disease: Secondary | ICD-10-CM | POA: Diagnosis not present

## 2020-07-08 DIAGNOSIS — N186 End stage renal disease: Secondary | ICD-10-CM | POA: Diagnosis not present

## 2020-07-08 DIAGNOSIS — Z992 Dependence on renal dialysis: Secondary | ICD-10-CM | POA: Diagnosis not present

## 2020-07-09 DIAGNOSIS — D631 Anemia in chronic kidney disease: Secondary | ICD-10-CM | POA: Diagnosis not present

## 2020-07-09 DIAGNOSIS — N186 End stage renal disease: Secondary | ICD-10-CM | POA: Diagnosis not present

## 2020-07-09 DIAGNOSIS — Z992 Dependence on renal dialysis: Secondary | ICD-10-CM | POA: Diagnosis not present

## 2020-07-09 DIAGNOSIS — N2581 Secondary hyperparathyroidism of renal origin: Secondary | ICD-10-CM | POA: Diagnosis not present

## 2020-07-09 DIAGNOSIS — E876 Hypokalemia: Secondary | ICD-10-CM | POA: Diagnosis not present

## 2020-07-09 DIAGNOSIS — D509 Iron deficiency anemia, unspecified: Secondary | ICD-10-CM | POA: Diagnosis not present

## 2020-07-09 DIAGNOSIS — R52 Pain, unspecified: Secondary | ICD-10-CM | POA: Diagnosis not present

## 2020-07-12 DIAGNOSIS — N186 End stage renal disease: Secondary | ICD-10-CM | POA: Diagnosis not present

## 2020-07-12 DIAGNOSIS — R52 Pain, unspecified: Secondary | ICD-10-CM | POA: Diagnosis not present

## 2020-07-12 DIAGNOSIS — D509 Iron deficiency anemia, unspecified: Secondary | ICD-10-CM | POA: Diagnosis not present

## 2020-07-12 DIAGNOSIS — E876 Hypokalemia: Secondary | ICD-10-CM | POA: Diagnosis not present

## 2020-07-12 DIAGNOSIS — Z992 Dependence on renal dialysis: Secondary | ICD-10-CM | POA: Diagnosis not present

## 2020-07-12 DIAGNOSIS — D631 Anemia in chronic kidney disease: Secondary | ICD-10-CM | POA: Diagnosis not present

## 2020-07-12 DIAGNOSIS — N2581 Secondary hyperparathyroidism of renal origin: Secondary | ICD-10-CM | POA: Diagnosis not present

## 2020-07-14 DIAGNOSIS — Z992 Dependence on renal dialysis: Secondary | ICD-10-CM | POA: Diagnosis not present

## 2020-07-14 DIAGNOSIS — E876 Hypokalemia: Secondary | ICD-10-CM | POA: Diagnosis not present

## 2020-07-14 DIAGNOSIS — N186 End stage renal disease: Secondary | ICD-10-CM | POA: Diagnosis not present

## 2020-07-14 DIAGNOSIS — R52 Pain, unspecified: Secondary | ICD-10-CM | POA: Diagnosis not present

## 2020-07-14 DIAGNOSIS — D631 Anemia in chronic kidney disease: Secondary | ICD-10-CM | POA: Diagnosis not present

## 2020-07-14 DIAGNOSIS — N2581 Secondary hyperparathyroidism of renal origin: Secondary | ICD-10-CM | POA: Diagnosis not present

## 2020-07-14 DIAGNOSIS — D509 Iron deficiency anemia, unspecified: Secondary | ICD-10-CM | POA: Diagnosis not present

## 2020-07-16 DIAGNOSIS — D509 Iron deficiency anemia, unspecified: Secondary | ICD-10-CM | POA: Diagnosis not present

## 2020-07-16 DIAGNOSIS — N186 End stage renal disease: Secondary | ICD-10-CM | POA: Diagnosis not present

## 2020-07-16 DIAGNOSIS — D631 Anemia in chronic kidney disease: Secondary | ICD-10-CM | POA: Diagnosis not present

## 2020-07-16 DIAGNOSIS — Z992 Dependence on renal dialysis: Secondary | ICD-10-CM | POA: Diagnosis not present

## 2020-07-16 DIAGNOSIS — E876 Hypokalemia: Secondary | ICD-10-CM | POA: Diagnosis not present

## 2020-07-16 DIAGNOSIS — R52 Pain, unspecified: Secondary | ICD-10-CM | POA: Diagnosis not present

## 2020-07-16 DIAGNOSIS — N2581 Secondary hyperparathyroidism of renal origin: Secondary | ICD-10-CM | POA: Diagnosis not present

## 2020-07-19 DIAGNOSIS — E876 Hypokalemia: Secondary | ICD-10-CM | POA: Diagnosis not present

## 2020-07-19 DIAGNOSIS — D631 Anemia in chronic kidney disease: Secondary | ICD-10-CM | POA: Diagnosis not present

## 2020-07-19 DIAGNOSIS — R52 Pain, unspecified: Secondary | ICD-10-CM | POA: Diagnosis not present

## 2020-07-19 DIAGNOSIS — N186 End stage renal disease: Secondary | ICD-10-CM | POA: Diagnosis not present

## 2020-07-19 DIAGNOSIS — D509 Iron deficiency anemia, unspecified: Secondary | ICD-10-CM | POA: Diagnosis not present

## 2020-07-19 DIAGNOSIS — Z992 Dependence on renal dialysis: Secondary | ICD-10-CM | POA: Diagnosis not present

## 2020-07-19 DIAGNOSIS — N2581 Secondary hyperparathyroidism of renal origin: Secondary | ICD-10-CM | POA: Diagnosis not present

## 2020-07-21 DIAGNOSIS — N186 End stage renal disease: Secondary | ICD-10-CM | POA: Diagnosis not present

## 2020-07-21 DIAGNOSIS — R52 Pain, unspecified: Secondary | ICD-10-CM | POA: Diagnosis not present

## 2020-07-21 DIAGNOSIS — D631 Anemia in chronic kidney disease: Secondary | ICD-10-CM | POA: Diagnosis not present

## 2020-07-21 DIAGNOSIS — Z992 Dependence on renal dialysis: Secondary | ICD-10-CM | POA: Diagnosis not present

## 2020-07-21 DIAGNOSIS — D509 Iron deficiency anemia, unspecified: Secondary | ICD-10-CM | POA: Diagnosis not present

## 2020-07-21 DIAGNOSIS — E876 Hypokalemia: Secondary | ICD-10-CM | POA: Diagnosis not present

## 2020-07-21 DIAGNOSIS — N2581 Secondary hyperparathyroidism of renal origin: Secondary | ICD-10-CM | POA: Diagnosis not present

## 2020-07-23 ENCOUNTER — Ambulatory Visit (INDEPENDENT_AMBULATORY_CARE_PROVIDER_SITE_OTHER): Payer: Medicare Other

## 2020-07-23 DIAGNOSIS — D631 Anemia in chronic kidney disease: Secondary | ICD-10-CM | POA: Diagnosis not present

## 2020-07-23 DIAGNOSIS — Z992 Dependence on renal dialysis: Secondary | ICD-10-CM | POA: Diagnosis not present

## 2020-07-23 DIAGNOSIS — N2581 Secondary hyperparathyroidism of renal origin: Secondary | ICD-10-CM | POA: Diagnosis not present

## 2020-07-23 DIAGNOSIS — E876 Hypokalemia: Secondary | ICD-10-CM | POA: Diagnosis not present

## 2020-07-23 DIAGNOSIS — D509 Iron deficiency anemia, unspecified: Secondary | ICD-10-CM | POA: Diagnosis not present

## 2020-07-23 DIAGNOSIS — I441 Atrioventricular block, second degree: Secondary | ICD-10-CM | POA: Diagnosis not present

## 2020-07-23 DIAGNOSIS — N186 End stage renal disease: Secondary | ICD-10-CM | POA: Diagnosis not present

## 2020-07-23 DIAGNOSIS — R52 Pain, unspecified: Secondary | ICD-10-CM | POA: Diagnosis not present

## 2020-07-26 DIAGNOSIS — D509 Iron deficiency anemia, unspecified: Secondary | ICD-10-CM | POA: Diagnosis not present

## 2020-07-26 DIAGNOSIS — N2581 Secondary hyperparathyroidism of renal origin: Secondary | ICD-10-CM | POA: Diagnosis not present

## 2020-07-26 DIAGNOSIS — E876 Hypokalemia: Secondary | ICD-10-CM | POA: Diagnosis not present

## 2020-07-26 DIAGNOSIS — D631 Anemia in chronic kidney disease: Secondary | ICD-10-CM | POA: Diagnosis not present

## 2020-07-26 DIAGNOSIS — N186 End stage renal disease: Secondary | ICD-10-CM | POA: Diagnosis not present

## 2020-07-26 DIAGNOSIS — R52 Pain, unspecified: Secondary | ICD-10-CM | POA: Diagnosis not present

## 2020-07-26 DIAGNOSIS — Z992 Dependence on renal dialysis: Secondary | ICD-10-CM | POA: Diagnosis not present

## 2020-07-26 LAB — CUP PACEART REMOTE DEVICE CHECK
Battery Remaining Longevity: 64 mo
Battery Voltage: 2.89 V
Brady Statistic AP VP Percent: 52.91 %
Brady Statistic AP VS Percent: 0.46 %
Brady Statistic AS VP Percent: 45.82 %
Brady Statistic AS VS Percent: 0.81 %
Brady Statistic RA Percent Paced: 53.37 %
Brady Statistic RV Percent Paced: 98.73 %
Date Time Interrogation Session: 20220716151412
Implantable Lead Implant Date: 20181217
Implantable Lead Implant Date: 20181217
Implantable Lead Location: 753859
Implantable Lead Location: 753859
Implantable Lead Model: 3830
Implantable Lead Model: 5076
Implantable Pulse Generator Implant Date: 20181217
Lead Channel Impedance Value: 266 Ohm
Lead Channel Impedance Value: 323 Ohm
Lead Channel Impedance Value: 399 Ohm
Lead Channel Impedance Value: 418 Ohm
Lead Channel Pacing Threshold Amplitude: 0.5 V
Lead Channel Pacing Threshold Amplitude: 0.75 V
Lead Channel Pacing Threshold Pulse Width: 0.4 ms
Lead Channel Pacing Threshold Pulse Width: 0.4 ms
Lead Channel Sensing Intrinsic Amplitude: 0.625 mV
Lead Channel Sensing Intrinsic Amplitude: 0.625 mV
Lead Channel Sensing Intrinsic Amplitude: 13.125 mV
Lead Channel Sensing Intrinsic Amplitude: 13.125 mV
Lead Channel Setting Pacing Amplitude: 2 V
Lead Channel Setting Pacing Amplitude: 2.5 V
Lead Channel Setting Pacing Pulse Width: 1 ms
Lead Channel Setting Sensing Sensitivity: 1.2 mV

## 2020-07-28 DIAGNOSIS — Z992 Dependence on renal dialysis: Secondary | ICD-10-CM | POA: Diagnosis not present

## 2020-07-28 DIAGNOSIS — D631 Anemia in chronic kidney disease: Secondary | ICD-10-CM | POA: Diagnosis not present

## 2020-07-28 DIAGNOSIS — N186 End stage renal disease: Secondary | ICD-10-CM | POA: Diagnosis not present

## 2020-07-28 DIAGNOSIS — E876 Hypokalemia: Secondary | ICD-10-CM | POA: Diagnosis not present

## 2020-07-28 DIAGNOSIS — D509 Iron deficiency anemia, unspecified: Secondary | ICD-10-CM | POA: Diagnosis not present

## 2020-07-28 DIAGNOSIS — R52 Pain, unspecified: Secondary | ICD-10-CM | POA: Diagnosis not present

## 2020-07-28 DIAGNOSIS — N2581 Secondary hyperparathyroidism of renal origin: Secondary | ICD-10-CM | POA: Diagnosis not present

## 2020-07-30 DIAGNOSIS — N186 End stage renal disease: Secondary | ICD-10-CM | POA: Diagnosis not present

## 2020-07-30 DIAGNOSIS — E876 Hypokalemia: Secondary | ICD-10-CM | POA: Diagnosis not present

## 2020-07-30 DIAGNOSIS — R52 Pain, unspecified: Secondary | ICD-10-CM | POA: Diagnosis not present

## 2020-07-30 DIAGNOSIS — D509 Iron deficiency anemia, unspecified: Secondary | ICD-10-CM | POA: Diagnosis not present

## 2020-07-30 DIAGNOSIS — Z992 Dependence on renal dialysis: Secondary | ICD-10-CM | POA: Diagnosis not present

## 2020-07-30 DIAGNOSIS — D631 Anemia in chronic kidney disease: Secondary | ICD-10-CM | POA: Diagnosis not present

## 2020-07-30 DIAGNOSIS — N2581 Secondary hyperparathyroidism of renal origin: Secondary | ICD-10-CM | POA: Diagnosis not present

## 2020-08-02 DIAGNOSIS — R52 Pain, unspecified: Secondary | ICD-10-CM | POA: Diagnosis not present

## 2020-08-02 DIAGNOSIS — Z992 Dependence on renal dialysis: Secondary | ICD-10-CM | POA: Diagnosis not present

## 2020-08-02 DIAGNOSIS — N2581 Secondary hyperparathyroidism of renal origin: Secondary | ICD-10-CM | POA: Diagnosis not present

## 2020-08-02 DIAGNOSIS — E876 Hypokalemia: Secondary | ICD-10-CM | POA: Diagnosis not present

## 2020-08-02 DIAGNOSIS — D631 Anemia in chronic kidney disease: Secondary | ICD-10-CM | POA: Diagnosis not present

## 2020-08-02 DIAGNOSIS — D509 Iron deficiency anemia, unspecified: Secondary | ICD-10-CM | POA: Diagnosis not present

## 2020-08-02 DIAGNOSIS — N186 End stage renal disease: Secondary | ICD-10-CM | POA: Diagnosis not present

## 2020-08-04 DIAGNOSIS — N186 End stage renal disease: Secondary | ICD-10-CM | POA: Diagnosis not present

## 2020-08-04 DIAGNOSIS — D631 Anemia in chronic kidney disease: Secondary | ICD-10-CM | POA: Diagnosis not present

## 2020-08-04 DIAGNOSIS — D509 Iron deficiency anemia, unspecified: Secondary | ICD-10-CM | POA: Diagnosis not present

## 2020-08-04 DIAGNOSIS — E876 Hypokalemia: Secondary | ICD-10-CM | POA: Diagnosis not present

## 2020-08-04 DIAGNOSIS — N2581 Secondary hyperparathyroidism of renal origin: Secondary | ICD-10-CM | POA: Diagnosis not present

## 2020-08-04 DIAGNOSIS — Z992 Dependence on renal dialysis: Secondary | ICD-10-CM | POA: Diagnosis not present

## 2020-08-04 DIAGNOSIS — R52 Pain, unspecified: Secondary | ICD-10-CM | POA: Diagnosis not present

## 2020-08-06 DIAGNOSIS — N2581 Secondary hyperparathyroidism of renal origin: Secondary | ICD-10-CM | POA: Diagnosis not present

## 2020-08-06 DIAGNOSIS — Z992 Dependence on renal dialysis: Secondary | ICD-10-CM | POA: Diagnosis not present

## 2020-08-06 DIAGNOSIS — R52 Pain, unspecified: Secondary | ICD-10-CM | POA: Diagnosis not present

## 2020-08-06 DIAGNOSIS — D509 Iron deficiency anemia, unspecified: Secondary | ICD-10-CM | POA: Diagnosis not present

## 2020-08-06 DIAGNOSIS — N186 End stage renal disease: Secondary | ICD-10-CM | POA: Diagnosis not present

## 2020-08-06 DIAGNOSIS — D631 Anemia in chronic kidney disease: Secondary | ICD-10-CM | POA: Diagnosis not present

## 2020-08-06 DIAGNOSIS — E876 Hypokalemia: Secondary | ICD-10-CM | POA: Diagnosis not present

## 2020-08-08 DIAGNOSIS — N186 End stage renal disease: Secondary | ICD-10-CM | POA: Diagnosis not present

## 2020-08-08 DIAGNOSIS — Z992 Dependence on renal dialysis: Secondary | ICD-10-CM | POA: Diagnosis not present

## 2020-08-08 DIAGNOSIS — I129 Hypertensive chronic kidney disease with stage 1 through stage 4 chronic kidney disease, or unspecified chronic kidney disease: Secondary | ICD-10-CM | POA: Diagnosis not present

## 2020-08-09 DIAGNOSIS — Z992 Dependence on renal dialysis: Secondary | ICD-10-CM | POA: Diagnosis not present

## 2020-08-09 DIAGNOSIS — N186 End stage renal disease: Secondary | ICD-10-CM | POA: Diagnosis not present

## 2020-08-09 DIAGNOSIS — N2581 Secondary hyperparathyroidism of renal origin: Secondary | ICD-10-CM | POA: Diagnosis not present

## 2020-08-09 DIAGNOSIS — R197 Diarrhea, unspecified: Secondary | ICD-10-CM | POA: Diagnosis not present

## 2020-08-09 DIAGNOSIS — D631 Anemia in chronic kidney disease: Secondary | ICD-10-CM | POA: Diagnosis not present

## 2020-08-09 DIAGNOSIS — E876 Hypokalemia: Secondary | ICD-10-CM | POA: Diagnosis not present

## 2020-08-11 DIAGNOSIS — R197 Diarrhea, unspecified: Secondary | ICD-10-CM | POA: Diagnosis not present

## 2020-08-11 DIAGNOSIS — E876 Hypokalemia: Secondary | ICD-10-CM | POA: Diagnosis not present

## 2020-08-11 DIAGNOSIS — N186 End stage renal disease: Secondary | ICD-10-CM | POA: Diagnosis not present

## 2020-08-11 DIAGNOSIS — Z992 Dependence on renal dialysis: Secondary | ICD-10-CM | POA: Diagnosis not present

## 2020-08-11 DIAGNOSIS — D631 Anemia in chronic kidney disease: Secondary | ICD-10-CM | POA: Diagnosis not present

## 2020-08-11 DIAGNOSIS — N2581 Secondary hyperparathyroidism of renal origin: Secondary | ICD-10-CM | POA: Diagnosis not present

## 2020-08-13 DIAGNOSIS — Z992 Dependence on renal dialysis: Secondary | ICD-10-CM | POA: Diagnosis not present

## 2020-08-13 DIAGNOSIS — R197 Diarrhea, unspecified: Secondary | ICD-10-CM | POA: Diagnosis not present

## 2020-08-13 DIAGNOSIS — N2581 Secondary hyperparathyroidism of renal origin: Secondary | ICD-10-CM | POA: Diagnosis not present

## 2020-08-13 DIAGNOSIS — N186 End stage renal disease: Secondary | ICD-10-CM | POA: Diagnosis not present

## 2020-08-13 DIAGNOSIS — E876 Hypokalemia: Secondary | ICD-10-CM | POA: Diagnosis not present

## 2020-08-13 DIAGNOSIS — D631 Anemia in chronic kidney disease: Secondary | ICD-10-CM | POA: Diagnosis not present

## 2020-08-16 DIAGNOSIS — D631 Anemia in chronic kidney disease: Secondary | ICD-10-CM | POA: Diagnosis not present

## 2020-08-16 DIAGNOSIS — R197 Diarrhea, unspecified: Secondary | ICD-10-CM | POA: Diagnosis not present

## 2020-08-16 DIAGNOSIS — Z992 Dependence on renal dialysis: Secondary | ICD-10-CM | POA: Diagnosis not present

## 2020-08-16 DIAGNOSIS — E876 Hypokalemia: Secondary | ICD-10-CM | POA: Diagnosis not present

## 2020-08-16 DIAGNOSIS — N2581 Secondary hyperparathyroidism of renal origin: Secondary | ICD-10-CM | POA: Diagnosis not present

## 2020-08-16 DIAGNOSIS — N186 End stage renal disease: Secondary | ICD-10-CM | POA: Diagnosis not present

## 2020-08-16 NOTE — Progress Notes (Signed)
Remote pacemaker transmission.   

## 2020-08-18 DIAGNOSIS — D631 Anemia in chronic kidney disease: Secondary | ICD-10-CM | POA: Diagnosis not present

## 2020-08-18 DIAGNOSIS — N186 End stage renal disease: Secondary | ICD-10-CM | POA: Diagnosis not present

## 2020-08-18 DIAGNOSIS — R197 Diarrhea, unspecified: Secondary | ICD-10-CM | POA: Diagnosis not present

## 2020-08-18 DIAGNOSIS — E876 Hypokalemia: Secondary | ICD-10-CM | POA: Diagnosis not present

## 2020-08-18 DIAGNOSIS — Z992 Dependence on renal dialysis: Secondary | ICD-10-CM | POA: Diagnosis not present

## 2020-08-18 DIAGNOSIS — N2581 Secondary hyperparathyroidism of renal origin: Secondary | ICD-10-CM | POA: Diagnosis not present

## 2020-08-20 DIAGNOSIS — E876 Hypokalemia: Secondary | ICD-10-CM | POA: Diagnosis not present

## 2020-08-20 DIAGNOSIS — Z992 Dependence on renal dialysis: Secondary | ICD-10-CM | POA: Diagnosis not present

## 2020-08-20 DIAGNOSIS — N2581 Secondary hyperparathyroidism of renal origin: Secondary | ICD-10-CM | POA: Diagnosis not present

## 2020-08-20 DIAGNOSIS — D631 Anemia in chronic kidney disease: Secondary | ICD-10-CM | POA: Diagnosis not present

## 2020-08-20 DIAGNOSIS — N186 End stage renal disease: Secondary | ICD-10-CM | POA: Diagnosis not present

## 2020-08-20 DIAGNOSIS — R197 Diarrhea, unspecified: Secondary | ICD-10-CM | POA: Diagnosis not present

## 2020-08-23 DIAGNOSIS — R197 Diarrhea, unspecified: Secondary | ICD-10-CM | POA: Diagnosis not present

## 2020-08-23 DIAGNOSIS — N186 End stage renal disease: Secondary | ICD-10-CM | POA: Diagnosis not present

## 2020-08-23 DIAGNOSIS — N2581 Secondary hyperparathyroidism of renal origin: Secondary | ICD-10-CM | POA: Diagnosis not present

## 2020-08-23 DIAGNOSIS — Z992 Dependence on renal dialysis: Secondary | ICD-10-CM | POA: Diagnosis not present

## 2020-08-23 DIAGNOSIS — E876 Hypokalemia: Secondary | ICD-10-CM | POA: Diagnosis not present

## 2020-08-23 DIAGNOSIS — D631 Anemia in chronic kidney disease: Secondary | ICD-10-CM | POA: Diagnosis not present

## 2020-08-25 DIAGNOSIS — N2581 Secondary hyperparathyroidism of renal origin: Secondary | ICD-10-CM | POA: Diagnosis not present

## 2020-08-25 DIAGNOSIS — N186 End stage renal disease: Secondary | ICD-10-CM | POA: Diagnosis not present

## 2020-08-25 DIAGNOSIS — D631 Anemia in chronic kidney disease: Secondary | ICD-10-CM | POA: Diagnosis not present

## 2020-08-25 DIAGNOSIS — E876 Hypokalemia: Secondary | ICD-10-CM | POA: Diagnosis not present

## 2020-08-25 DIAGNOSIS — R197 Diarrhea, unspecified: Secondary | ICD-10-CM | POA: Diagnosis not present

## 2020-08-25 DIAGNOSIS — Z992 Dependence on renal dialysis: Secondary | ICD-10-CM | POA: Diagnosis not present

## 2020-08-26 ENCOUNTER — Other Ambulatory Visit: Payer: Self-pay | Admitting: Internal Medicine

## 2020-08-27 DIAGNOSIS — Z992 Dependence on renal dialysis: Secondary | ICD-10-CM | POA: Diagnosis not present

## 2020-08-27 DIAGNOSIS — N186 End stage renal disease: Secondary | ICD-10-CM | POA: Diagnosis not present

## 2020-08-27 DIAGNOSIS — E876 Hypokalemia: Secondary | ICD-10-CM | POA: Diagnosis not present

## 2020-08-27 DIAGNOSIS — N2581 Secondary hyperparathyroidism of renal origin: Secondary | ICD-10-CM | POA: Diagnosis not present

## 2020-08-27 DIAGNOSIS — D631 Anemia in chronic kidney disease: Secondary | ICD-10-CM | POA: Diagnosis not present

## 2020-08-27 DIAGNOSIS — R197 Diarrhea, unspecified: Secondary | ICD-10-CM | POA: Diagnosis not present

## 2020-08-30 DIAGNOSIS — E876 Hypokalemia: Secondary | ICD-10-CM | POA: Diagnosis not present

## 2020-08-30 DIAGNOSIS — N2581 Secondary hyperparathyroidism of renal origin: Secondary | ICD-10-CM | POA: Diagnosis not present

## 2020-08-30 DIAGNOSIS — N186 End stage renal disease: Secondary | ICD-10-CM | POA: Diagnosis not present

## 2020-08-30 DIAGNOSIS — R197 Diarrhea, unspecified: Secondary | ICD-10-CM | POA: Diagnosis not present

## 2020-08-30 DIAGNOSIS — Z992 Dependence on renal dialysis: Secondary | ICD-10-CM | POA: Diagnosis not present

## 2020-08-30 DIAGNOSIS — D631 Anemia in chronic kidney disease: Secondary | ICD-10-CM | POA: Diagnosis not present

## 2020-09-01 DIAGNOSIS — R197 Diarrhea, unspecified: Secondary | ICD-10-CM | POA: Diagnosis not present

## 2020-09-01 DIAGNOSIS — E876 Hypokalemia: Secondary | ICD-10-CM | POA: Diagnosis not present

## 2020-09-01 DIAGNOSIS — D631 Anemia in chronic kidney disease: Secondary | ICD-10-CM | POA: Diagnosis not present

## 2020-09-01 DIAGNOSIS — N186 End stage renal disease: Secondary | ICD-10-CM | POA: Diagnosis not present

## 2020-09-01 DIAGNOSIS — N2581 Secondary hyperparathyroidism of renal origin: Secondary | ICD-10-CM | POA: Diagnosis not present

## 2020-09-01 DIAGNOSIS — Z992 Dependence on renal dialysis: Secondary | ICD-10-CM | POA: Diagnosis not present

## 2020-09-03 DIAGNOSIS — Z992 Dependence on renal dialysis: Secondary | ICD-10-CM | POA: Diagnosis not present

## 2020-09-03 DIAGNOSIS — E876 Hypokalemia: Secondary | ICD-10-CM | POA: Diagnosis not present

## 2020-09-03 DIAGNOSIS — D631 Anemia in chronic kidney disease: Secondary | ICD-10-CM | POA: Diagnosis not present

## 2020-09-03 DIAGNOSIS — N186 End stage renal disease: Secondary | ICD-10-CM | POA: Diagnosis not present

## 2020-09-03 DIAGNOSIS — R197 Diarrhea, unspecified: Secondary | ICD-10-CM | POA: Diagnosis not present

## 2020-09-03 DIAGNOSIS — N2581 Secondary hyperparathyroidism of renal origin: Secondary | ICD-10-CM | POA: Diagnosis not present

## 2020-09-06 ENCOUNTER — Ambulatory Visit (INDEPENDENT_AMBULATORY_CARE_PROVIDER_SITE_OTHER): Payer: Medicare Other | Admitting: Cardiovascular Disease

## 2020-09-06 ENCOUNTER — Other Ambulatory Visit: Payer: Self-pay

## 2020-09-06 ENCOUNTER — Encounter: Payer: Self-pay | Admitting: Cardiovascular Disease

## 2020-09-06 DIAGNOSIS — I441 Atrioventricular block, second degree: Secondary | ICD-10-CM

## 2020-09-06 DIAGNOSIS — Z9989 Dependence on other enabling machines and devices: Secondary | ICD-10-CM | POA: Diagnosis not present

## 2020-09-06 DIAGNOSIS — I480 Paroxysmal atrial fibrillation: Secondary | ICD-10-CM

## 2020-09-06 DIAGNOSIS — N2581 Secondary hyperparathyroidism of renal origin: Secondary | ICD-10-CM | POA: Diagnosis not present

## 2020-09-06 DIAGNOSIS — I5023 Acute on chronic systolic (congestive) heart failure: Secondary | ICD-10-CM

## 2020-09-06 DIAGNOSIS — Z951 Presence of aortocoronary bypass graft: Secondary | ICD-10-CM

## 2020-09-06 DIAGNOSIS — Z7901 Long term (current) use of anticoagulants: Secondary | ICD-10-CM

## 2020-09-06 DIAGNOSIS — I513 Intracardiac thrombosis, not elsewhere classified: Secondary | ICD-10-CM

## 2020-09-06 DIAGNOSIS — N186 End stage renal disease: Secondary | ICD-10-CM

## 2020-09-06 DIAGNOSIS — Z950 Presence of cardiac pacemaker: Secondary | ICD-10-CM

## 2020-09-06 DIAGNOSIS — R238 Other skin changes: Secondary | ICD-10-CM

## 2020-09-06 DIAGNOSIS — R197 Diarrhea, unspecified: Secondary | ICD-10-CM | POA: Diagnosis not present

## 2020-09-06 DIAGNOSIS — Z992 Dependence on renal dialysis: Secondary | ICD-10-CM | POA: Diagnosis not present

## 2020-09-06 DIAGNOSIS — E876 Hypokalemia: Secondary | ICD-10-CM | POA: Diagnosis not present

## 2020-09-06 DIAGNOSIS — I2510 Atherosclerotic heart disease of native coronary artery without angina pectoris: Secondary | ICD-10-CM | POA: Diagnosis not present

## 2020-09-06 DIAGNOSIS — R233 Spontaneous ecchymoses: Secondary | ICD-10-CM

## 2020-09-06 DIAGNOSIS — G4733 Obstructive sleep apnea (adult) (pediatric): Secondary | ICD-10-CM

## 2020-09-06 DIAGNOSIS — D631 Anemia in chronic kidney disease: Secondary | ICD-10-CM | POA: Diagnosis not present

## 2020-09-06 NOTE — Patient Instructions (Signed)
Medication Instructions:  DECREASE- Eliquis 2.5 mg by mouth daily  *If you need a refill on your cardiac medications before your next appointment, please call your pharmacy*   Lab Work: None Ordered   Testing/Procedures: None Ordered   Follow-Up: At [facility], you and your health needs are our priority.  As part of our continuing mission to provide you with exceptional heart care, we have created designated Provider Care Teams.  These Care Teams include your primary Cardiologist (physician) and Advanced Practice Providers (APPs -  Physician Assistants and Nurse Practitioners) who all work together to provide you with the care you need, when you need it.  We recommend signing up for the patient portal called "MyChart".  Sign up information is provided on this After Visit Summary.  MyChart is used to connect with patients for Virtual Visits (Telemedicine).  Patients are able to view lab/test results, encounter notes, upcoming appointments, etc.  Non-urgent messages can be sent to your provider as well.   To learn more about what you can do with MyChart, go to [email or web address].    Your next appointment:   6 month(s)  The format for your next appointment:   In Person  Provider:   You may see Shelva Majestic, MD or one of the following Advanced Practice Providers on your designated Care Team:   Almyra Deforest, PA-C Fabian Sharp, PA-C or  Roby Lofts, [place]

## 2020-09-06 NOTE — Progress Notes (Signed)
Patient ID: Paul Price, male   DOB: 1940-05-31, 80 y.o.   MRN: [id number]     Primary MD:  Dr. [name]  HPI: Paul Price is a 80 y.o. male who presents for a 6 month cardiology followup evaluation.   Paul Price has established CAD and in 1992 underwent CABG revascularization surgery LIMA to the LAD and diagonal. In 2009 he was found to have an atretic LIMA graft to the LAD and a patent vein graft supplying the diagonal vessel. He had 70% narrowing in the LAD beyond the diagonal was treated medically. In April 2012 due to progressive chest pain and scintigraphic evidence for ischemia in the mid distal LAD territory repeat catheterization was performed and he underwent successful stenting to his LAD beyond the diagonal with a 3.0x20 mm Promus DES stent postdilated 3.25 mm. Subsequent, he has remained active and has done well. His last stress test in 2013 showed an apical defect without ischemia.  Additional problems include obstructive sleep apnea on CPAP therapy, moderate obesity, hypertension, history of DVT, and hyperlipidemia.  He sees Dr. Shelia Media for primary care.  On November 19, 2012 he saw Kerin Ransom.PAC for suspected atrial fibrillation and CHF after having been seen by his primary physician and noted some increasing shortness of breath with activity. A CardioNet monitor  demonstrated  a 4.3 second pause which led to a reduction in his  beta blocker dose  from Lopressor 75 mg twice a day to 25 twice a day. He saw Tarri Fuller on 12/02/2012 at which time he was in sinus rhythm but also was found to have probably 9 beat run of nonsustained VT on monitor which is he was maintained on low-dose beta blocker therapy. Subsequent cardiac monitoring did not show any further episodes of significant bradycardia but he did have one additional 9 beat run of nonsustained PSVT at a rate of 109 beats per minute on 12/17/12. . Laboratory done by Dr. Shelia Media on 11/27/2012 showed a potassium of 4.2. His  creatinine was 1.82. BUN 33.  An echo Doppler study done 12/09/2012 showed an estimated ejection fraction at 50-55%. Although no diagnostic regional wall motion abnormalities were definitively identified, this possibility was not completely excluded. There was grade 2 diastolic dysfunction. There is mild left atrial dilatation and mild mitral regurgitation.  A renal ultrasound has  demonstrate bilateral small renal cysts.    In 2015 he was found to have a DVT in his left leg and was started on eliquis by Dr. Shelia Media.  He was hospitalized on November 23 through 12/09/2013 and presented with fevers, and sudden onset of severe back pain.  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  Blood cultures were negative.   After 1 week in the hospital, he spent 3 weeks at rehabilitation.  He was treated with 4 weeks of antibiotic therapy and after his hospitalization.  He was followed by Dr. Michel Bickers from infectious disease.  He last saw him in March 2016 and att that time he was felt to be completely cleared cured.    He has renal insufficiency and has been followed by Dr. Gillermina Phy since he had developed acute on chronic kidney injury.  His serum creatinine peaked at 2.8.  Ultimately, this has improved.    When I saw him in follow-up, he continued to note lower extremity edema. He was no longer anticoagulation but has been on Plavix and aspirin.  I scheduled him for follow-up lower extremity venous ultrasound which  showed resolution of his left distal femoral vein and popliteal vein thrombus . His blood pressure has been treated with Toprol-XL 50 mg losartan 100 mg furosemide 40 mg and amlodipine 10 mg.  He is on lipid lowering therapy with atorvastatin.  He takes allopurinol for gout.  There is history of GERD treated with ranitidine.    He admits to using his CPAP with 100% compliance.  He states he has a new machine.  However, recently he has been noticing frequent awakenings.  He denies  awareness of breakthrough snoring.    When I saw him in April 2018, he noticed a change in symptomatology with more progressive exertional dyspnea.  He denied any exertional chest pressure.  However, he has noticed some nonexertional constant chest wall ache.  His shortness of breath was occurring with less activity.  He is unaware of palpitations.  He denies presyncope or syncope.  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   An echo Doppler study on 04/28/2016 showed an EF of 50-55%.  There was grade 2 diastolic dysfunction.  Septal motion.  She showed paradoxical motion.  The left atrium was severely dilated.  There was mild MR.  The RV was mildly dilated, as was the right atrium.  There was mild palmar hypertension with PA pressure 33.  He underwent a nuclear perfusion study to further evaluate his symptoms.  This remained low risk.  Although calculated at 47% EF, visually it appeared to be 55%.  There was a small nonreversible defect in the mid anterior and apical location without ischemia.  He also underwent lower extremity arterial Doppler studies which showed normal ABIs bilaterally and there was no evidence for segmental lower extremity arterial disease. Creatinine was 1.76 on 04/14/2016; Hemoglobin A1c was increased at 6.7.  Lipid studies revealed a TC 128, triglyceride 152, LDL 66, and he has a low HDL level of 32.    He was hospitalized on July 31 through 08/10/2016 and was found to have atrial fibrillation with rapid ventricular response as well as acute renal failure.  His creatinine had risen to 2.9 and improved to 1.9.  With gentle hydration.  His losartan and spironolactone were held.  He was started on Xarelto at discharge.  On August 29 through 09/09/2016.  He was rehospitalized with GI bleed and acute blood loss anemia.  He underwent endoscopy and colonoscopy and GI recommended continuation of  anticoagulation with concomitant Protonix therapy.  He tells me he received 3 units of blood.  During his initial hospitalization on August 1.  An echo Doppler study showed an EF of 45-50% with diffuse hypocontractility.  There was aortic sclerosis without stenosis, mitral annular calcification, and mild LA dilation.  He continues to be on CPAP.   He was hospitalized in December 2018 for dyspnea on exertion accompanied with lightheadedness.  He was noted to have Mobitz type I heart block during his hospitalization and beta-blocker was discontinued due to suspicion of sick sinus syndrome.  He was ultimately evaluated by Dr. Caryl Comes and underwent pacemaker implantation on December 18.  Subsequently, he has not had any recurrent syncope or presyncope.  A pacemaker device check in March 2019 showed normal device function.  I last saw him in June 2019.  Since his pacemaker insertion he denied any recurrent episodes of dizziness.  His weakness has resolved and shortness of breath is improved.  He  continues to use CPAP with 100% compliance.  He is unaware of any atrial fibrillation.  He admits to occasional leg swelling.  He denies any chest pain.    He saw Dr. Adam Phenix on December 12, 2018 in follow-up of his pacemaker.  Apparently, for some time prior to that evaluation his amlodipine which had been 7.5 mg was discontinued by Dr. Shelia Media due to his concern according to the patient that his blood pressure was getting low.  When evaluated by Dr. Caryl Comes in December, blood pressure was elevated at 150/78.  He was advised to reinstitute amlodipine at 2.5 mg.  However, the patient has subsequently increase this to 5 mg due to continued blood pressure elevations at home with typical blood pressures greater than 062 systolically.  He has continued to be on anticoagulation therapy with Xarelto with his history of PAF and is on a reduced dose with his renal insufficiency.  He is followed by Dr. Azzie Price for stage IV chronic  kidney disease with creatinine 2.39 in November 2020.  He denies any chest pain PND orthopnea.  I saw him in August 2021 and since his prior evaluation in January 2021, he continued to do well.  Apparently his amlodipine dose was reduced from 5 down to 2.5 mg since his blood pressure has become somewhat low.  He has undergone supplemental iron injections by Dr. Irene Limbo for his anemia.  He continues to be without anginal symptomatology on amlodipine 2.5 mg, metoprolol succinate 25 mg daily and he continues to be on furosemide 20 mg.  He states his blood pressure at home typically is in the 120s to low 130s.  He recently underwent laboratory on August 12, 2019 which revealed hemoglobin of 8 2 hematocrit 27.0.  He had macrocytic indices.  B12 level was normal.  He has stage IV chronic kidney disease and creatinine had slightly improved to 3.14 from 3.47 in May 2021.  He is followed by Dr. Meredeth Ide He also had seen Dr. Donnetta Hutching.  I last saw him on February 19, 2020.  Since his last evaluation with me, he was started on dialysis therapy for end-stage renal disease he had been hospitalized in early January 2022 with Covid pneumonia and was treated with read them severe and steroids.  After discharge was readmitted several days later on February 01, 2020 with upper GI bleeding, AKI and uremic symptoms.  A transesophageal echo showed an EF of 40 to 45% and there was evidence for a left atrial appendage thrombus measuring 1.5 x 0.6 cm.  He was ultimately given clearance by GI to resume anticoagulation and was transitioned from Xarelto to Eliquis.  He was started on hemodialysis on February 06, 2020 and his symptoms improved with dialysis.  He underwent basilic vein fistula placement and conversion to hemodialysis tunneled catheter on January 31 by Dr. Carlis Abbott.  He is now undergoing dialysis on Monday Wednesday and Friday.  He has been home a week.  He is having dialysis at [facility].    Since I last saw him, he  continues to undergo dialysis on Monday Wednesday and Friday.  He admits to easy bleeding.  He has not had any chest pain.  His last dialysis was this morning.  He denies any chest pain.  He has continued to be on amlodipine 7.5 mg and metoprolol succinate 25 mg daily.  He is on atorvastatin 20 mg for hyperlipidemia.  He has been on Eliquis at 5 mg twice daily dosing.  He has  been undergoing every 3 months remote pacemaker checks.  He presents for evaluation.  Past Medical History:  Diagnosis Date   Arthritis    "shoulders" (09/07/2016)   Atrial fibrillation (HCC)    BPH (benign prostatic hyperplasia)    Chronic kidney disease (CKD), stage III (moderate) (HCC)    Coronary artery disease    DDD (degenerative disc disease), cervical    DVT (deep venous thrombosis) ([place]) 12/2013   LLE   GERD (gastroesophageal reflux disease)    Gout    High cholesterol    History of blood transfusion 09/06/2016   2 u PRBC   History of scarlet fever 1940s   History of stress test 06/2011   No significant ischemia, this is a low risk scan. Clinical correlation recommended Abnormal myocardial perfusion study.   Hx of echocardiogram 05/2009   EF 40-45%, he did have mild annular calcification with mild-to-moderate MR and mild TR as well as aortic valve sclerosis. Estimated RV systolic pressure was 21 mm.   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   Neck pain    "not chronic" (09/07/2016)   Obesity    OSA on CPAP    Presence of permanent cardiac pacemaker    RBBB    Renal insufficiency    Right renal mass    Spinal stenosis of lumbar region     Past Surgical History:  Procedure Laterality Date   APPENDECTOMY     AV FISTULA PLACEMENT Right 02/09/2020   Procedure: ARTERIOVENOUS (AV) FISTULA  CREATION RIGHT UPPER EXTREMITY;  Surgeon: Marty Heck, MD;  Location: [place];  Service: Vascular;  Laterality: Right;   [place] Right 03/30/2020   Procedure: RIGHT SECOND STAGE [place];  Surgeon: Waynetta Sandy, MD;  Location: [place];  Service: Vascular;  Laterality: Right;   CARDIAC CATHETERIZATION  2009   he was found to have a atretic LIMA graft to his LAD and had a patent vein graft supplying his diagonal vessel. At that time, he had 70% narrowing in his LAD beyond a diagonal vessel which was initially treated medically.   COLONOSCOPY WITH PROPOFOL Left 09/09/2016   Procedure: COLONOSCOPY WITH PROPOFOL;  Surgeon: Arta Silence, MD;  Location: [place];  Service: Endoscopy;  Laterality: Left;   CORONARY ANGIOPLASTY WITH STENT PLACEMENT  April 2012   LAD DES   CORONARY ARTERY BYPASS GRAFT  1992   with LIMA to the LAD and diagonal.   CYSTOSCOPY N/A 03/31/2019   Procedure: CYSTOSCOPY FLEXIBLE;  Surgeon: Lucas Mallow, MD;  Location: [facility];  Service: Urology;  Laterality: N/A;   CYSTOSCOPY WITH URETHRAL DILATATION N/A 05/21/2019   Procedure: CYSTOSCOPY WITH URETHRAL BALLOON DILATATION;  Surgeon: Lucas Mallow, MD;  Location: [facility];  Service: Urology;  Laterality: N/A;   ESOPHAGOGASTRODUODENOSCOPY N/A 11/16/2019   Procedure: ESOPHAGOGASTRODUODENOSCOPY (EGD);  Surgeon: Wilford Corner, MD;  Location: [place];  Service: Endoscopy;  Laterality: N/A;   ESOPHAGOGASTRODUODENOSCOPY (EGD) WITH PROPOFOL Left 09/08/2016   Procedure: ESOPHAGOGASTRODUODENOSCOPY (EGD) WITH PROPOFOL;  Surgeon: Ronnette Juniper, MD;  Location: [place];  Service: Gastroenterology;  Laterality: Left;   EXCHANGE OF A DIALYSIS CATHETER Right 02/09/2020   Procedure: EXCHANGE OF A DIALYSIS CATHETER;  Surgeon: Marty Heck, MD;  Location: [place];  Service: Vascular;  Laterality: Right;   GIVENS CAPSULE STUDY N/A 11/16/2019   Procedure: GIVENS CAPSULE STUDY;  Surgeon:  Wilford Corner, MD;  Location: [place];  Service: Endoscopy;  Laterality: N/A;   INGUINAL HERNIA REPAIR     "don't  remeimber which side"   IR FLUORO GUIDE CV LINE RIGHT  02/05/2020   IR US GUIDE VASC ACCESS RIGHT  02/05/2020   LAPAROSCOPIC NEPHRECTOMY Right 03/31/2019   Procedure: RIGHT  HAND ASSIST LAPAROSCOPIC NEPHRECTOMY;  Surgeon: Lucas Mallow, MD;  Location: [facility];  Service: Urology;  Laterality: Right;   PACEMAKER IMPLANT N/A 12/25/2016   Procedure: PACEMAKER IMPLANT;  Surgeon: Deboraha Sprang, MD;  Location: [place] [facility];  Service: Cardiovascular;  Laterality: N/A;   TEE WITHOUT CARDIOVERSION N/A 02/06/2020   Procedure: TRANSESOPHAGEAL ECHOCARDIOGRAM (TEE);  Surgeon: Skeet Latch, MD;  Location: [facility]COPY;  Service: Cardiovascular;  Laterality: N/A;   TONSILLECTOMY      Allergies  Allergen Reactions   Simvastatin Other (See Comments)    Abdominal cramps   Nitrostat [Nitroglycerin] Other (See Comments)    Causes blood pressure to "bottom out"    Current Outpatient Medications  Medication Sig Dispense Refill   acetaminophen (TYLENOL) 500 MG tablet Take 500-1,000 mg by mouth every 6 (six) hours as needed for mild pain, moderate pain or headache.      allopurinol (ZYLOPRIM) 300 MG tablet Take 300 mg by mouth daily.     apixaban (ELIQUIS) 2.5 MG TABS tablet Take 2.5 mg by mouth 2 (two) times daily.     apixaban (ELIQUIS) 5 MG TABS tablet Take 5 mg by mouth 2 (two) times daily.     atorvastatin (LIPITOR) 20 MG tablet TAKE 1 TABLET BY MOUTH DAILY 90 tablet 1   B Complex-C (B-COMPLEX WITH VITAMIN C) tablet Take 1 tablet by mouth daily.     docusate sodium (COLACE) 100 MG capsule TAKE 2 CAPSULES (200 MG TOTAL) BY MOUTH DAILY. 60 capsule 0   ferrous sulfate 325 (65 FE) MG tablet Take by mouth as needed. During dialysis     Methoxy PEG-Epoetin Beta (MIRCERA IJ) as needed (during diaysis).     metoprolol succinate (TOPROL-XL) 25 MG 24 hr tablet Take 25 mg by mouth daily.     pantoprazole (PROTONIX) 40 MG tablet Take 40 mg by mouth daily.     Tamsulosin HCl (FLOMAX) 0.4 MG CAPS Take 1  capsule (0.4 mg total) by mouth daily. 30 capsule 0   No current facility-administered medications for this visit.    Social History   Socioeconomic History   Marital status: Married    Spouse name: Not on file   Number of children: Not on file   Years of education: Not on file   Highest education level: Not on file  Occupational History   Not on file  Tobacco Use   Smoking status: Former    Packs/day: 1.00    Years: 30.00    Pack years: 30.00    Types: Cigarettes   Smokeless tobacco: Never   Tobacco comments:    quit ~ 1985  Vaping Use   Vaping Use: Never used  Substance and Sexual Activity   Alcohol use: Not Currently    Alcohol/week: 0.0 standard drinks    Comment: rare beer   Drug use: No   Sexual activity: Not Currently  Other Topics Concern   Not on file  Social History Narrative   Not on file   Social Determinants of Health   Financial Resource Strain: Not on file  Food Insecurity: Not on file  Transportation Needs: Not on file  Physical Activity: Not on file  Stress: Not on file  Social Connections: Not on file  Intimate Partner Violence: Not on file    Socially he is married, has 3 children 7 grandchildren. He does not use tobacco or alcohol.  ROS General: Negative; No fevers, chills, or night sweats; Moderate obesity HEENT: Negative; No changes in vision or hearing, sinus congestion, difficulty swallowing Pulmonary: Negative; No cough, wheezing, shortness of breath, hemoptysis Cardiovascular: Negative; No chest pain, presyncope, syncope, palpatations GI: Recent GI bleed, status post endoscopy and colonoscopy without definitive abnormality. GU: Recent initiation of dialysis Musculoskeletal: History of paraspinal myositis  Hematologic/Oncology: Negative; no easy bruising, bleeding Endocrine: Negative; no heat/cold intolerance; no diabetes Neuro: Occasional paresthesias; no changes in balance, headaches Skin: Negative; No rashes or skin  lesions Psychiatric: Negative; No behavioral problems, depression Sleep: Positive for sleep apnea on CPAP; No snoring, daytime sleepiness, hypersomnolence, bruxism, restless legs, hypnogognic hallucinations, no cataplexy Other comprehensive 14 point system review is negative.   PE BP (!) 134/52   Pulse 60   Ht 5' 6" (1.676 m)   Wt 161 lb 12.8 oz (73.4 kg)   SpO2 99%   BMI 26.12 kg/m    Repeat blood pressure by me was 140/60.  Wt Readings from Last 3 Encounters:  09/06/20 161 lb 12.8 oz (73.4 kg)  04/29/20 161 lb 6.4 oz (73.2 kg)  03/30/20 160 lb (72.6 kg)   General: Alert, oriented, no distress.  Skin: Significant bruisability of arms bilaterally HEENT: Normocephalic, atraumatic. Pupils equal round and reactive to light; sclera anicteric; extraocular muscles intact;  Nose without nasal septal hypertrophy Mouth/Parynx benign; Mallinpatti scale 3 Neck: No JVD, no carotid bruits; normal carotid upstroke Lungs: clear to ausculatation and percussion; no wheezing or rales Chest wall: without tenderness to palpitation Heart: PMI not displaced, RRR, s1 s2 normal, 1/6 systolic murmur, no diastolic murmur, no rubs, gallops, thrills, or heaves Abdomen: soft, nontender; no hepatosplenomehaly, BS+; abdominal aorta nontender and not dilated by palpation. Back: no CVA tenderness Pulses 2+ Musculoskeletal: full range of motion, normal strength, no joint deformities Extremities: no clubbing cyanosis or edema, Homan's sign negative  Neurologic: grossly nonfocal; Cranial nerves grossly wnl Psychologic: Normal mood and affect  September 06, 2020 ECG (independently read by me): AV paced at 60; PR 184 msec  February 19, 2020 ECG (independently read by me): V paced at 81 with 100% capture.  August 2021 ECG (independently read by me): Atrial sensed, ventricular paced rhythm at 68 bpm  January 13, 2019 ECG (independently read by me): Ventricular paced rhythm at 68 bpm.  June 2019 ECG (independently  read by me): Sinus rhythm appropriate atrial sensing and atrial pacing,  right bundle branch block, left axis deviation  September 2018 ECG (independently read by me): normal sinus rhythm at 60 bpm.  Incomplete right bundle branch block.  Mild inferolateral ST changes.  May 2018 ECG (independently read by me): sinus bradycardia 58 bpm.  Left axis deviation.  Right bundle branch block with repolarization changes.  QTc interval 471 ms.  April 2018 ECG (independently read by me):NSR At 61, left axis deviation.  Right bundle branch block.  September 2017 ECG (independently read by me): Normal sinus rhythm with mild sinus arrhythmia.  First degree AV block with a PR interval at 212 ms.  Right bundle-branch block.  March 2017 ECG (independently read by me): Normal sinus rhythm at 61 bpm.  Right bundle branch block with repolarization changes.  QTc interval 477 ms.  PR interval  204 ms.  October 2016 ECG (independently read by me): Normal sinus rhythm at 65 bpm.  Right bundle branch block.  First degree AV block.  June 2015 ECG (independently read by me): Normal sinus rhythm at 61 beats per minute.  Right bundle branch block.  Mild voltage criteria for LVH in aVL  ECG (independently read by me): Sinus rhythm at 60 beats per minute. Right bundle branch block; PAC, inferolateral T abnormalities.  LABS: I personally reviewed the lab work from November 15, 2018.  BMP Latest Ref Rng & Units 03/30/2020 02/12/2020 02/11/2020  Glucose 70 - 99 mg/dL 113(H) 93 84  BUN 8 - 23 mg/dL 22 26(H) 19  Creatinine 0.61 - 1.24 mg/dL 3.10(H) 3.58(H) 3.23(H)  BUN/Creat Ratio 10 - 24 - - -  Sodium 135 - 145 mmol/L 139 141 141  Potassium 3.5 - 5.1 mmol/L 3.2(L) 4.2 4.0  Chloride 98 - 111 mmol/L 93(L) 107 105  CO2 22 - 32 mmol/L - 22 25  Calcium 8.9 - 10.3 mg/dL - 7.6(L) 7.7(L)   Hepatic Function Latest Ref Rng & Units 02/12/2020 02/10/2020 02/06/2020  Total Protein 6.5 - 8.1 g/dL - - -  Albumin 3.5 - 5.0 g/dL 1.8(L) 1.7(L)  1.7(L)  AST 15 - 41 U/L - - -  ALT 0 - 44 U/L - - -  Alk Phosphatase 38 - 126 U/L - - -  Total Bilirubin 0.3 - 1.2 mg/dL - - -   CBC Latest Ref Rng & Units 03/30/2020 02/12/2020 02/11/2020  WBC 4.0 - 10.5 K/uL - 9.4 9.2  Hemoglobin 13.0 - 17.0 g/dL 7.8(L) 8.7(L) 8.5(L)  Hematocrit 39.0 - 52.0 % 23.0(L) 27.9(L) 28.5(L)  Platelets 150 - 400 K/uL - 141(L) 145(L)   Lab Results  Component Value Date   MCV 85.6 02/12/2020   MCV 86.9 02/11/2020   MCV 87.8 02/10/2020   Lab Results  Component Value Date   TSH 2.159 01/21/2020    Lab Results  Component Value Date   HGBA1C 6.1 (H) 02/04/2020     Lipid Panel     Component Value Date/Time   CHOL 104 12/22/2016 0535   TRIG 91 12/22/2016 0535   HDL 29 (L) 12/22/2016 0535   CHOLHDL 3.6 12/22/2016 0535   VLDL 18 12/22/2016 0535   LDLCALC 57 12/22/2016 0535     BNP No results found for: PROBNP  Lipid Panel     Component Value Date/Time   CHOL 104 12/22/2016 0535   Lipid Panel     Component Value Date/Time   CHOL 104 12/22/2016 0535   TRIG 91 12/22/2016 0535   HDL 29 (L) 12/22/2016 0535   CHOLHDL 3.6 12/22/2016 0535   VLDL 18 12/22/2016 0535   LDLCALC 57 12/22/2016 0535    RADIOLOGY: No results found.  IMPRESSION:  1. Coronary artery disease involving native coronary artery of native heart without angina pectoris   2. Hx of CABG   3. Second degree Mobitz I AV block   4. Pacemaker   5. OSA on CPAP   6. Left atrial thrombus   7. Paroxysmal atrial fibrillation (HCC)   8. Anticoagulated   9. Easy bruisability   10. End stage renal disease ([place]) on dialysis Monday, Wednesday and Fridays     ASSESSMENT AND PLAN: Paul Price is an 45 -year-old Caucasian male who is 30 years status post CABG revascularization surgery in 1992 and 10 years since a stent was placed in the LAD beyond this diagonal vessel in 2012. He  has a documented atretic LIMA graft and a patent vein graft which supplies this diagonal graft.  His  echo  Doppler study in 2013 showed an ejection fraction of 50-55% and probable grade 2 diastolic dysfunction. He did have mild left atrial dilatation and mild mitral regurgitation.  On his echo Doppler study in April 2018 EF remained stable at 59-16%; grade 2 diastolic dysfunction, and there was evidence for  severe left atrial enlargement with mild right atrial enlargement and mild pulmonary hypertension.  His nuclear perfusion study remained in the low risk with only a small defect in the mid apical and anterior wall consistent with prior infarction without associated ischemia.  He has a history of PAF which he was started on anticoagulation therapy and also has developed renal insufficiency.  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 Caryl Comes and continues to be followed by Dr. Caryl Comes for pacemaker follow-up.  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  He has obstructive sleep apnea and continues to use CPAP therapy.  I obtained a download from July 28 through September 03, 2020.  Compliance is excellent and average use is 11 hours and 21 minutes.  Night.  Pressure settings are minimum of 11 and maximum pressure of 20 cm.  His 95th percentile pressure is 12.1 with maximum average pressure of 12.9.  Remotely in 2015 he had developed a DVT and at that time was remotely treated with Eliquis.  With his his history of PAF and left atrial thrombus he was subsequently started on Xarelto which ultimately was changed to Eliquis for anticoagulation.  His most recent echo Doppler study has shown an EF of 40 to 45% with thrombus in the left atrial appendage measuring 1.5 x 0.6 cm.  Presently, he has significant bruisability bilaterally.  He is now over 29 years old and has end-stage renal disease.  I have  recommended reducing Eliquis to 2.5 mg twice daily.  He will follow-up with Dr. Caryl Comes for pacemaker evaluation and Dr. Shelia Media for primary care.  I will see him in 6 months for reevaluation.    Troy Sine, MD, [facility]  09/12/2020 6:20 PM

## 2020-09-08 DIAGNOSIS — E876 Hypokalemia: Secondary | ICD-10-CM | POA: Diagnosis not present

## 2020-09-08 DIAGNOSIS — I129 Hypertensive chronic kidney disease with stage 1 through stage 4 chronic kidney disease, or unspecified chronic kidney disease: Secondary | ICD-10-CM | POA: Diagnosis not present

## 2020-09-08 DIAGNOSIS — N2581 Secondary hyperparathyroidism of renal origin: Secondary | ICD-10-CM | POA: Diagnosis not present

## 2020-09-08 DIAGNOSIS — R197 Diarrhea, unspecified: Secondary | ICD-10-CM | POA: Diagnosis not present

## 2020-09-08 DIAGNOSIS — D631 Anemia in chronic kidney disease: Secondary | ICD-10-CM | POA: Diagnosis not present

## 2020-09-08 DIAGNOSIS — Z992 Dependence on renal dialysis: Secondary | ICD-10-CM | POA: Diagnosis not present

## 2020-09-08 DIAGNOSIS — N186 End stage renal disease: Secondary | ICD-10-CM | POA: Diagnosis not present

## 2020-09-09 DIAGNOSIS — K5730 Diverticulosis of large intestine without perforation or abscess without bleeding: Secondary | ICD-10-CM | POA: Diagnosis not present

## 2020-09-09 DIAGNOSIS — J90 Pleural effusion, not elsewhere classified: Secondary | ICD-10-CM | POA: Diagnosis not present

## 2020-09-09 DIAGNOSIS — K8020 Calculus of gallbladder without cholecystitis without obstruction: Secondary | ICD-10-CM | POA: Diagnosis not present

## 2020-09-09 DIAGNOSIS — R0600 Dyspnea, unspecified: Secondary | ICD-10-CM | POA: Diagnosis not present

## 2020-09-09 DIAGNOSIS — C641 Malignant neoplasm of right kidney, except renal pelvis: Secondary | ICD-10-CM | POA: Diagnosis not present

## 2020-09-09 DIAGNOSIS — K828 Other specified diseases of gallbladder: Secondary | ICD-10-CM | POA: Diagnosis not present

## 2020-09-09 DIAGNOSIS — R634 Abnormal weight loss: Secondary | ICD-10-CM | POA: Diagnosis not present

## 2020-09-09 DIAGNOSIS — I2510 Atherosclerotic heart disease of native coronary artery without angina pectoris: Secondary | ICD-10-CM | POA: Diagnosis not present

## 2020-09-10 DIAGNOSIS — D509 Iron deficiency anemia, unspecified: Secondary | ICD-10-CM | POA: Diagnosis not present

## 2020-09-10 DIAGNOSIS — N2581 Secondary hyperparathyroidism of renal origin: Secondary | ICD-10-CM | POA: Diagnosis not present

## 2020-09-10 DIAGNOSIS — D631 Anemia in chronic kidney disease: Secondary | ICD-10-CM | POA: Diagnosis not present

## 2020-09-10 DIAGNOSIS — E876 Hypokalemia: Secondary | ICD-10-CM | POA: Diagnosis not present

## 2020-09-10 DIAGNOSIS — N186 End stage renal disease: Secondary | ICD-10-CM | POA: Diagnosis not present

## 2020-09-10 DIAGNOSIS — Z992 Dependence on renal dialysis: Secondary | ICD-10-CM | POA: Diagnosis not present

## 2020-09-12 ENCOUNTER — Encounter: Payer: Self-pay | Admitting: Cardiovascular Disease

## 2020-09-13 DIAGNOSIS — N2581 Secondary hyperparathyroidism of renal origin: Secondary | ICD-10-CM | POA: Diagnosis not present

## 2020-09-13 DIAGNOSIS — D509 Iron deficiency anemia, unspecified: Secondary | ICD-10-CM | POA: Diagnosis not present

## 2020-09-13 DIAGNOSIS — D631 Anemia in chronic kidney disease: Secondary | ICD-10-CM | POA: Diagnosis not present

## 2020-09-13 DIAGNOSIS — Z992 Dependence on renal dialysis: Secondary | ICD-10-CM | POA: Diagnosis not present

## 2020-09-13 DIAGNOSIS — E876 Hypokalemia: Secondary | ICD-10-CM | POA: Diagnosis not present

## 2020-09-13 DIAGNOSIS — N186 End stage renal disease: Secondary | ICD-10-CM | POA: Diagnosis not present

## 2020-09-15 DIAGNOSIS — D631 Anemia in chronic kidney disease: Secondary | ICD-10-CM | POA: Diagnosis not present

## 2020-09-15 DIAGNOSIS — N186 End stage renal disease: Secondary | ICD-10-CM | POA: Diagnosis not present

## 2020-09-15 DIAGNOSIS — Z992 Dependence on renal dialysis: Secondary | ICD-10-CM | POA: Diagnosis not present

## 2020-09-15 DIAGNOSIS — D509 Iron deficiency anemia, unspecified: Secondary | ICD-10-CM | POA: Diagnosis not present

## 2020-09-15 DIAGNOSIS — E876 Hypokalemia: Secondary | ICD-10-CM | POA: Diagnosis not present

## 2020-09-15 DIAGNOSIS — N2581 Secondary hyperparathyroidism of renal origin: Secondary | ICD-10-CM | POA: Diagnosis not present

## 2020-09-16 DIAGNOSIS — C641 Malignant neoplasm of right kidney, except renal pelvis: Secondary | ICD-10-CM | POA: Diagnosis not present

## 2020-09-17 DIAGNOSIS — Z992 Dependence on renal dialysis: Secondary | ICD-10-CM | POA: Diagnosis not present

## 2020-09-17 DIAGNOSIS — N2581 Secondary hyperparathyroidism of renal origin: Secondary | ICD-10-CM | POA: Diagnosis not present

## 2020-09-17 DIAGNOSIS — D631 Anemia in chronic kidney disease: Secondary | ICD-10-CM | POA: Diagnosis not present

## 2020-09-17 DIAGNOSIS — N186 End stage renal disease: Secondary | ICD-10-CM | POA: Diagnosis not present

## 2020-09-17 DIAGNOSIS — D509 Iron deficiency anemia, unspecified: Secondary | ICD-10-CM | POA: Diagnosis not present

## 2020-09-17 DIAGNOSIS — E876 Hypokalemia: Secondary | ICD-10-CM | POA: Diagnosis not present

## 2020-09-20 DIAGNOSIS — D631 Anemia in chronic kidney disease: Secondary | ICD-10-CM | POA: Diagnosis not present

## 2020-09-20 DIAGNOSIS — Z992 Dependence on renal dialysis: Secondary | ICD-10-CM | POA: Diagnosis not present

## 2020-09-20 DIAGNOSIS — N2581 Secondary hyperparathyroidism of renal origin: Secondary | ICD-10-CM | POA: Diagnosis not present

## 2020-09-20 DIAGNOSIS — D509 Iron deficiency anemia, unspecified: Secondary | ICD-10-CM | POA: Diagnosis not present

## 2020-09-20 DIAGNOSIS — N186 End stage renal disease: Secondary | ICD-10-CM | POA: Diagnosis not present

## 2020-09-20 DIAGNOSIS — E876 Hypokalemia: Secondary | ICD-10-CM | POA: Diagnosis not present

## 2020-09-22 DIAGNOSIS — D509 Iron deficiency anemia, unspecified: Secondary | ICD-10-CM | POA: Diagnosis not present

## 2020-09-22 DIAGNOSIS — N186 End stage renal disease: Secondary | ICD-10-CM | POA: Diagnosis not present

## 2020-09-22 DIAGNOSIS — E876 Hypokalemia: Secondary | ICD-10-CM | POA: Diagnosis not present

## 2020-09-22 DIAGNOSIS — N2581 Secondary hyperparathyroidism of renal origin: Secondary | ICD-10-CM | POA: Diagnosis not present

## 2020-09-22 DIAGNOSIS — Z992 Dependence on renal dialysis: Secondary | ICD-10-CM | POA: Diagnosis not present

## 2020-09-22 DIAGNOSIS — D631 Anemia in chronic kidney disease: Secondary | ICD-10-CM | POA: Diagnosis not present

## 2020-09-23 DIAGNOSIS — T82858A Stenosis of vascular prosthetic devices, implants and grafts, initial encounter: Secondary | ICD-10-CM | POA: Diagnosis not present

## 2020-09-23 DIAGNOSIS — Z992 Dependence on renal dialysis: Secondary | ICD-10-CM | POA: Diagnosis not present

## 2020-09-23 DIAGNOSIS — N186 End stage renal disease: Secondary | ICD-10-CM | POA: Diagnosis not present

## 2020-09-23 DIAGNOSIS — I871 Compression of vein: Secondary | ICD-10-CM | POA: Diagnosis not present

## 2020-09-24 DIAGNOSIS — E876 Hypokalemia: Secondary | ICD-10-CM | POA: Diagnosis not present

## 2020-09-24 DIAGNOSIS — Z992 Dependence on renal dialysis: Secondary | ICD-10-CM | POA: Diagnosis not present

## 2020-09-24 DIAGNOSIS — N186 End stage renal disease: Secondary | ICD-10-CM | POA: Diagnosis not present

## 2020-09-24 DIAGNOSIS — D509 Iron deficiency anemia, unspecified: Secondary | ICD-10-CM | POA: Diagnosis not present

## 2020-09-24 DIAGNOSIS — D631 Anemia in chronic kidney disease: Secondary | ICD-10-CM | POA: Diagnosis not present

## 2020-09-24 DIAGNOSIS — N2581 Secondary hyperparathyroidism of renal origin: Secondary | ICD-10-CM | POA: Diagnosis not present

## 2020-09-27 DIAGNOSIS — E876 Hypokalemia: Secondary | ICD-10-CM | POA: Diagnosis not present

## 2020-09-27 DIAGNOSIS — D631 Anemia in chronic kidney disease: Secondary | ICD-10-CM | POA: Diagnosis not present

## 2020-09-27 DIAGNOSIS — Z992 Dependence on renal dialysis: Secondary | ICD-10-CM | POA: Diagnosis not present

## 2020-09-27 DIAGNOSIS — N186 End stage renal disease: Secondary | ICD-10-CM | POA: Diagnosis not present

## 2020-09-27 DIAGNOSIS — D509 Iron deficiency anemia, unspecified: Secondary | ICD-10-CM | POA: Diagnosis not present

## 2020-09-27 DIAGNOSIS — N2581 Secondary hyperparathyroidism of renal origin: Secondary | ICD-10-CM | POA: Diagnosis not present

## 2020-09-28 ENCOUNTER — Telehealth: Payer: Self-pay | Admitting: Cardiovascular Disease

## 2020-09-28 MED ORDER — APIXABAN 2.5 MG PO TABS: 2.5000 mg | ORAL_TABLET | Freq: Two times a day (BID) | ORAL | 1 refills | Status: DC

## 2020-09-28 NOTE — Telephone Encounter (Signed)
ELIQUIS 5MG  AND 2.5MG  ON MED LIST PT QUALIFIES FOR 2.5MG  AND IS REQUESTING A REFILL OF THAT ONE JUST MAKING SURE ITS SAFE TO DELETE THE 5MG  BID ROUTING TO [facility] POOL FOR APPROVAL TO DELETE AND SEND THE 2.5  Prescription refill request for Eliquis received. Indication:afib Last office visit:thomas kelly 09/06/20 Scr:3.100 mg/ 03/30/2020 Age: 26m Weight:73.4kg

## 2020-09-28 NOTE — Telephone Encounter (Signed)
Eliquis 5mg  removed from med list. Ok to refil 2.5mg  dose

## 2020-09-28 NOTE — Telephone Encounter (Signed)
*  STAT* If patient is at the pharmacy, call can be transferred to refill team.   1. Which medications need to be refilled? (please list name of each medication and dose if known) apixaban (ELIQUIS) 2.5 MG TABS tablet Take 2.5 mg by mouth 2 (two) times daily.  2. Which pharmacy/location (including street and city if local pharmacy) is medication to be sent to? [facility] [place] [address].  3. Do they need a 30 day or 90 day supply? 30 ds

## 2020-09-29 DIAGNOSIS — N2581 Secondary hyperparathyroidism of renal origin: Secondary | ICD-10-CM | POA: Diagnosis not present

## 2020-09-29 DIAGNOSIS — E876 Hypokalemia: Secondary | ICD-10-CM | POA: Diagnosis not present

## 2020-09-29 DIAGNOSIS — D631 Anemia in chronic kidney disease: Secondary | ICD-10-CM | POA: Diagnosis not present

## 2020-09-29 DIAGNOSIS — D509 Iron deficiency anemia, unspecified: Secondary | ICD-10-CM | POA: Diagnosis not present

## 2020-09-29 DIAGNOSIS — Z992 Dependence on renal dialysis: Secondary | ICD-10-CM | POA: Diagnosis not present

## 2020-09-29 DIAGNOSIS — N186 End stage renal disease: Secondary | ICD-10-CM | POA: Diagnosis not present

## 2020-10-01 DIAGNOSIS — Z992 Dependence on renal dialysis: Secondary | ICD-10-CM | POA: Diagnosis not present

## 2020-10-01 DIAGNOSIS — N186 End stage renal disease: Secondary | ICD-10-CM | POA: Diagnosis not present

## 2020-10-01 DIAGNOSIS — E876 Hypokalemia: Secondary | ICD-10-CM | POA: Diagnosis not present

## 2020-10-01 DIAGNOSIS — D509 Iron deficiency anemia, unspecified: Secondary | ICD-10-CM | POA: Diagnosis not present

## 2020-10-01 DIAGNOSIS — D631 Anemia in chronic kidney disease: Secondary | ICD-10-CM | POA: Diagnosis not present

## 2020-10-01 DIAGNOSIS — N2581 Secondary hyperparathyroidism of renal origin: Secondary | ICD-10-CM | POA: Diagnosis not present

## 2020-10-04 DIAGNOSIS — D631 Anemia in chronic kidney disease: Secondary | ICD-10-CM | POA: Diagnosis not present

## 2020-10-04 DIAGNOSIS — N2581 Secondary hyperparathyroidism of renal origin: Secondary | ICD-10-CM | POA: Diagnosis not present

## 2020-10-04 DIAGNOSIS — Z992 Dependence on renal dialysis: Secondary | ICD-10-CM | POA: Diagnosis not present

## 2020-10-04 DIAGNOSIS — E876 Hypokalemia: Secondary | ICD-10-CM | POA: Diagnosis not present

## 2020-10-04 DIAGNOSIS — N186 End stage renal disease: Secondary | ICD-10-CM | POA: Diagnosis not present

## 2020-10-04 DIAGNOSIS — D509 Iron deficiency anemia, unspecified: Secondary | ICD-10-CM | POA: Diagnosis not present

## 2020-10-06 DIAGNOSIS — E876 Hypokalemia: Secondary | ICD-10-CM | POA: Diagnosis not present

## 2020-10-06 DIAGNOSIS — D509 Iron deficiency anemia, unspecified: Secondary | ICD-10-CM | POA: Diagnosis not present

## 2020-10-06 DIAGNOSIS — D631 Anemia in chronic kidney disease: Secondary | ICD-10-CM | POA: Diagnosis not present

## 2020-10-06 DIAGNOSIS — N2581 Secondary hyperparathyroidism of renal origin: Secondary | ICD-10-CM | POA: Diagnosis not present

## 2020-10-06 DIAGNOSIS — Z992 Dependence on renal dialysis: Secondary | ICD-10-CM | POA: Diagnosis not present

## 2020-10-06 DIAGNOSIS — N186 End stage renal disease: Secondary | ICD-10-CM | POA: Diagnosis not present

## 2020-10-08 DIAGNOSIS — E876 Hypokalemia: Secondary | ICD-10-CM | POA: Diagnosis not present

## 2020-10-08 DIAGNOSIS — D631 Anemia in chronic kidney disease: Secondary | ICD-10-CM | POA: Diagnosis not present

## 2020-10-08 DIAGNOSIS — D509 Iron deficiency anemia, unspecified: Secondary | ICD-10-CM | POA: Diagnosis not present

## 2020-10-08 DIAGNOSIS — N2581 Secondary hyperparathyroidism of renal origin: Secondary | ICD-10-CM | POA: Diagnosis not present

## 2020-10-08 DIAGNOSIS — Z992 Dependence on renal dialysis: Secondary | ICD-10-CM | POA: Diagnosis not present

## 2020-10-08 DIAGNOSIS — I129 Hypertensive chronic kidney disease with stage 1 through stage 4 chronic kidney disease, or unspecified chronic kidney disease: Secondary | ICD-10-CM | POA: Diagnosis not present

## 2020-10-08 DIAGNOSIS — N186 End stage renal disease: Secondary | ICD-10-CM | POA: Diagnosis not present

## 2020-10-11 DIAGNOSIS — N2581 Secondary hyperparathyroidism of renal origin: Secondary | ICD-10-CM | POA: Diagnosis not present

## 2020-10-11 DIAGNOSIS — E876 Hypokalemia: Secondary | ICD-10-CM | POA: Diagnosis not present

## 2020-10-11 DIAGNOSIS — D509 Iron deficiency anemia, unspecified: Secondary | ICD-10-CM | POA: Diagnosis not present

## 2020-10-11 DIAGNOSIS — Z23 Encounter for immunization: Secondary | ICD-10-CM | POA: Diagnosis not present

## 2020-10-11 DIAGNOSIS — N186 End stage renal disease: Secondary | ICD-10-CM | POA: Diagnosis not present

## 2020-10-11 DIAGNOSIS — D631 Anemia in chronic kidney disease: Secondary | ICD-10-CM | POA: Diagnosis not present

## 2020-10-11 DIAGNOSIS — Z992 Dependence on renal dialysis: Secondary | ICD-10-CM | POA: Diagnosis not present

## 2020-10-13 DIAGNOSIS — Z992 Dependence on renal dialysis: Secondary | ICD-10-CM | POA: Diagnosis not present

## 2020-10-13 DIAGNOSIS — E876 Hypokalemia: Secondary | ICD-10-CM | POA: Diagnosis not present

## 2020-10-13 DIAGNOSIS — D631 Anemia in chronic kidney disease: Secondary | ICD-10-CM | POA: Diagnosis not present

## 2020-10-13 DIAGNOSIS — D509 Iron deficiency anemia, unspecified: Secondary | ICD-10-CM | POA: Diagnosis not present

## 2020-10-13 DIAGNOSIS — N2581 Secondary hyperparathyroidism of renal origin: Secondary | ICD-10-CM | POA: Diagnosis not present

## 2020-10-13 DIAGNOSIS — N186 End stage renal disease: Secondary | ICD-10-CM | POA: Diagnosis not present

## 2020-10-13 DIAGNOSIS — Z23 Encounter for immunization: Secondary | ICD-10-CM | POA: Diagnosis not present

## 2020-10-15 DIAGNOSIS — D631 Anemia in chronic kidney disease: Secondary | ICD-10-CM | POA: Diagnosis not present

## 2020-10-15 DIAGNOSIS — N186 End stage renal disease: Secondary | ICD-10-CM | POA: Diagnosis not present

## 2020-10-15 DIAGNOSIS — E876 Hypokalemia: Secondary | ICD-10-CM | POA: Diagnosis not present

## 2020-10-15 DIAGNOSIS — N2581 Secondary hyperparathyroidism of renal origin: Secondary | ICD-10-CM | POA: Diagnosis not present

## 2020-10-15 DIAGNOSIS — Z23 Encounter for immunization: Secondary | ICD-10-CM | POA: Diagnosis not present

## 2020-10-15 DIAGNOSIS — D509 Iron deficiency anemia, unspecified: Secondary | ICD-10-CM | POA: Diagnosis not present

## 2020-10-15 DIAGNOSIS — Z992 Dependence on renal dialysis: Secondary | ICD-10-CM | POA: Diagnosis not present

## 2020-10-18 DIAGNOSIS — D631 Anemia in chronic kidney disease: Secondary | ICD-10-CM | POA: Diagnosis not present

## 2020-10-18 DIAGNOSIS — Z992 Dependence on renal dialysis: Secondary | ICD-10-CM | POA: Diagnosis not present

## 2020-10-18 DIAGNOSIS — N186 End stage renal disease: Secondary | ICD-10-CM | POA: Diagnosis not present

## 2020-10-18 DIAGNOSIS — Z23 Encounter for immunization: Secondary | ICD-10-CM | POA: Diagnosis not present

## 2020-10-18 DIAGNOSIS — E876 Hypokalemia: Secondary | ICD-10-CM | POA: Diagnosis not present

## 2020-10-18 DIAGNOSIS — D509 Iron deficiency anemia, unspecified: Secondary | ICD-10-CM | POA: Diagnosis not present

## 2020-10-18 DIAGNOSIS — N2581 Secondary hyperparathyroidism of renal origin: Secondary | ICD-10-CM | POA: Diagnosis not present

## 2020-10-20 DIAGNOSIS — D509 Iron deficiency anemia, unspecified: Secondary | ICD-10-CM | POA: Diagnosis not present

## 2020-10-20 DIAGNOSIS — Z992 Dependence on renal dialysis: Secondary | ICD-10-CM | POA: Diagnosis not present

## 2020-10-20 DIAGNOSIS — D631 Anemia in chronic kidney disease: Secondary | ICD-10-CM | POA: Diagnosis not present

## 2020-10-20 DIAGNOSIS — N2581 Secondary hyperparathyroidism of renal origin: Secondary | ICD-10-CM | POA: Diagnosis not present

## 2020-10-20 DIAGNOSIS — E876 Hypokalemia: Secondary | ICD-10-CM | POA: Diagnosis not present

## 2020-10-20 DIAGNOSIS — N186 End stage renal disease: Secondary | ICD-10-CM | POA: Diagnosis not present

## 2020-10-20 DIAGNOSIS — Z23 Encounter for immunization: Secondary | ICD-10-CM | POA: Diagnosis not present

## 2020-10-22 DIAGNOSIS — Z992 Dependence on renal dialysis: Secondary | ICD-10-CM | POA: Diagnosis not present

## 2020-10-22 DIAGNOSIS — D509 Iron deficiency anemia, unspecified: Secondary | ICD-10-CM | POA: Diagnosis not present

## 2020-10-22 DIAGNOSIS — E876 Hypokalemia: Secondary | ICD-10-CM | POA: Diagnosis not present

## 2020-10-22 DIAGNOSIS — D631 Anemia in chronic kidney disease: Secondary | ICD-10-CM | POA: Diagnosis not present

## 2020-10-22 DIAGNOSIS — N186 End stage renal disease: Secondary | ICD-10-CM | POA: Diagnosis not present

## 2020-10-22 DIAGNOSIS — Z23 Encounter for immunization: Secondary | ICD-10-CM | POA: Diagnosis not present

## 2020-10-22 DIAGNOSIS — N2581 Secondary hyperparathyroidism of renal origin: Secondary | ICD-10-CM | POA: Diagnosis not present

## 2020-10-25 DIAGNOSIS — D631 Anemia in chronic kidney disease: Secondary | ICD-10-CM | POA: Diagnosis not present

## 2020-10-25 DIAGNOSIS — N186 End stage renal disease: Secondary | ICD-10-CM | POA: Diagnosis not present

## 2020-10-25 DIAGNOSIS — D509 Iron deficiency anemia, unspecified: Secondary | ICD-10-CM | POA: Diagnosis not present

## 2020-10-25 DIAGNOSIS — N2581 Secondary hyperparathyroidism of renal origin: Secondary | ICD-10-CM | POA: Diagnosis not present

## 2020-10-25 DIAGNOSIS — Z992 Dependence on renal dialysis: Secondary | ICD-10-CM | POA: Diagnosis not present

## 2020-10-25 DIAGNOSIS — E876 Hypokalemia: Secondary | ICD-10-CM | POA: Diagnosis not present

## 2020-10-25 DIAGNOSIS — Z23 Encounter for immunization: Secondary | ICD-10-CM | POA: Diagnosis not present

## 2020-10-27 DIAGNOSIS — Z23 Encounter for immunization: Secondary | ICD-10-CM | POA: Diagnosis not present

## 2020-10-27 DIAGNOSIS — E876 Hypokalemia: Secondary | ICD-10-CM | POA: Diagnosis not present

## 2020-10-27 DIAGNOSIS — N2581 Secondary hyperparathyroidism of renal origin: Secondary | ICD-10-CM | POA: Diagnosis not present

## 2020-10-27 DIAGNOSIS — N186 End stage renal disease: Secondary | ICD-10-CM | POA: Diagnosis not present

## 2020-10-27 DIAGNOSIS — D509 Iron deficiency anemia, unspecified: Secondary | ICD-10-CM | POA: Diagnosis not present

## 2020-10-27 DIAGNOSIS — Z992 Dependence on renal dialysis: Secondary | ICD-10-CM | POA: Diagnosis not present

## 2020-10-27 DIAGNOSIS — D631 Anemia in chronic kidney disease: Secondary | ICD-10-CM | POA: Diagnosis not present

## 2020-10-29 ENCOUNTER — Ambulatory Visit (INDEPENDENT_AMBULATORY_CARE_PROVIDER_SITE_OTHER): Payer: Medicare Other

## 2020-10-29 DIAGNOSIS — I441 Atrioventricular block, second degree: Secondary | ICD-10-CM

## 2020-10-29 DIAGNOSIS — E876 Hypokalemia: Secondary | ICD-10-CM | POA: Diagnosis not present

## 2020-10-29 DIAGNOSIS — N2581 Secondary hyperparathyroidism of renal origin: Secondary | ICD-10-CM | POA: Diagnosis not present

## 2020-10-29 DIAGNOSIS — D631 Anemia in chronic kidney disease: Secondary | ICD-10-CM | POA: Diagnosis not present

## 2020-10-29 DIAGNOSIS — N186 End stage renal disease: Secondary | ICD-10-CM | POA: Diagnosis not present

## 2020-10-29 DIAGNOSIS — D509 Iron deficiency anemia, unspecified: Secondary | ICD-10-CM | POA: Diagnosis not present

## 2020-10-29 DIAGNOSIS — Z23 Encounter for immunization: Secondary | ICD-10-CM | POA: Diagnosis not present

## 2020-10-29 DIAGNOSIS — Z992 Dependence on renal dialysis: Secondary | ICD-10-CM | POA: Diagnosis not present

## 2020-11-01 DIAGNOSIS — E876 Hypokalemia: Secondary | ICD-10-CM | POA: Diagnosis not present

## 2020-11-01 DIAGNOSIS — N186 End stage renal disease: Secondary | ICD-10-CM | POA: Diagnosis not present

## 2020-11-01 DIAGNOSIS — Z992 Dependence on renal dialysis: Secondary | ICD-10-CM | POA: Diagnosis not present

## 2020-11-01 DIAGNOSIS — D631 Anemia in chronic kidney disease: Secondary | ICD-10-CM | POA: Diagnosis not present

## 2020-11-01 DIAGNOSIS — Z23 Encounter for immunization: Secondary | ICD-10-CM | POA: Diagnosis not present

## 2020-11-01 DIAGNOSIS — N2581 Secondary hyperparathyroidism of renal origin: Secondary | ICD-10-CM | POA: Diagnosis not present

## 2020-11-01 DIAGNOSIS — D509 Iron deficiency anemia, unspecified: Secondary | ICD-10-CM | POA: Diagnosis not present

## 2020-11-01 LAB — CUP PACEART REMOTE DEVICE CHECK
Battery Remaining Longevity: 56 mo
Battery Voltage: 2.89 V
Brady Statistic AP VP Percent: 71.78 %
Brady Statistic AP VS Percent: 0.11 %
Brady Statistic AS VP Percent: 27.46 %
Brady Statistic AS VS Percent: 0.65 %
Brady Statistic RA Percent Paced: 71.11 %
Brady Statistic RV Percent Paced: 99.24 %
Date Time Interrogation Session: 20221020175144
Implantable Lead Implant Date: 20181217
Implantable Lead Implant Date: 20181217
Implantable Lead Location: 753859
Implantable Lead Location: 753859
Implantable Lead Model: 3830
Implantable Lead Model: 5076
Implantable Pulse Generator Implant Date: 20181217
Lead Channel Impedance Value: 285 Ohm
Lead Channel Impedance Value: 323 Ohm
Lead Channel Impedance Value: 399 Ohm
Lead Channel Impedance Value: 399 Ohm
Lead Channel Pacing Threshold Amplitude: 0.5 V
Lead Channel Pacing Threshold Amplitude: 0.75 V
Lead Channel Pacing Threshold Pulse Width: 0.4 ms
Lead Channel Pacing Threshold Pulse Width: 0.4 ms
Lead Channel Sensing Intrinsic Amplitude: 0.5 mV
Lead Channel Sensing Intrinsic Amplitude: 0.5 mV
Lead Channel Sensing Intrinsic Amplitude: 11.625 mV
Lead Channel Sensing Intrinsic Amplitude: 11.625 mV
Lead Channel Setting Pacing Amplitude: 2 V
Lead Channel Setting Pacing Amplitude: 2.5 V
Lead Channel Setting Pacing Pulse Width: 1 ms
Lead Channel Setting Sensing Sensitivity: 1.2 mV

## 2020-11-03 DIAGNOSIS — D509 Iron deficiency anemia, unspecified: Secondary | ICD-10-CM | POA: Diagnosis not present

## 2020-11-03 DIAGNOSIS — Z23 Encounter for immunization: Secondary | ICD-10-CM | POA: Diagnosis not present

## 2020-11-03 DIAGNOSIS — E876 Hypokalemia: Secondary | ICD-10-CM | POA: Diagnosis not present

## 2020-11-03 DIAGNOSIS — D631 Anemia in chronic kidney disease: Secondary | ICD-10-CM | POA: Diagnosis not present

## 2020-11-03 DIAGNOSIS — Z992 Dependence on renal dialysis: Secondary | ICD-10-CM | POA: Diagnosis not present

## 2020-11-03 DIAGNOSIS — N2581 Secondary hyperparathyroidism of renal origin: Secondary | ICD-10-CM | POA: Diagnosis not present

## 2020-11-03 DIAGNOSIS — N186 End stage renal disease: Secondary | ICD-10-CM | POA: Diagnosis not present

## 2020-11-05 DIAGNOSIS — E876 Hypokalemia: Secondary | ICD-10-CM | POA: Diagnosis not present

## 2020-11-05 DIAGNOSIS — Z992 Dependence on renal dialysis: Secondary | ICD-10-CM | POA: Diagnosis not present

## 2020-11-05 DIAGNOSIS — Z23 Encounter for immunization: Secondary | ICD-10-CM | POA: Diagnosis not present

## 2020-11-05 DIAGNOSIS — D631 Anemia in chronic kidney disease: Secondary | ICD-10-CM | POA: Diagnosis not present

## 2020-11-05 DIAGNOSIS — N2581 Secondary hyperparathyroidism of renal origin: Secondary | ICD-10-CM | POA: Diagnosis not present

## 2020-11-05 DIAGNOSIS — N186 End stage renal disease: Secondary | ICD-10-CM | POA: Diagnosis not present

## 2020-11-05 DIAGNOSIS — D509 Iron deficiency anemia, unspecified: Secondary | ICD-10-CM | POA: Diagnosis not present

## 2020-11-08 DIAGNOSIS — I129 Hypertensive chronic kidney disease with stage 1 through stage 4 chronic kidney disease, or unspecified chronic kidney disease: Secondary | ICD-10-CM | POA: Diagnosis not present

## 2020-11-08 DIAGNOSIS — Z992 Dependence on renal dialysis: Secondary | ICD-10-CM | POA: Diagnosis not present

## 2020-11-08 DIAGNOSIS — D509 Iron deficiency anemia, unspecified: Secondary | ICD-10-CM | POA: Diagnosis not present

## 2020-11-08 DIAGNOSIS — Z23 Encounter for immunization: Secondary | ICD-10-CM | POA: Diagnosis not present

## 2020-11-08 DIAGNOSIS — D631 Anemia in chronic kidney disease: Secondary | ICD-10-CM | POA: Diagnosis not present

## 2020-11-08 DIAGNOSIS — N186 End stage renal disease: Secondary | ICD-10-CM | POA: Diagnosis not present

## 2020-11-08 DIAGNOSIS — N2581 Secondary hyperparathyroidism of renal origin: Secondary | ICD-10-CM | POA: Diagnosis not present

## 2020-11-08 DIAGNOSIS — E876 Hypokalemia: Secondary | ICD-10-CM | POA: Diagnosis not present

## 2020-11-08 NOTE — Progress Notes (Signed)
Remote pacemaker transmission.   

## 2020-11-10 DIAGNOSIS — N2581 Secondary hyperparathyroidism of renal origin: Secondary | ICD-10-CM | POA: Diagnosis not present

## 2020-11-10 DIAGNOSIS — E876 Hypokalemia: Secondary | ICD-10-CM | POA: Diagnosis not present

## 2020-11-10 DIAGNOSIS — Z23 Encounter for immunization: Secondary | ICD-10-CM | POA: Diagnosis not present

## 2020-11-10 DIAGNOSIS — N186 End stage renal disease: Secondary | ICD-10-CM | POA: Diagnosis not present

## 2020-11-10 DIAGNOSIS — D509 Iron deficiency anemia, unspecified: Secondary | ICD-10-CM | POA: Diagnosis not present

## 2020-11-10 DIAGNOSIS — Z992 Dependence on renal dialysis: Secondary | ICD-10-CM | POA: Diagnosis not present

## 2020-11-10 DIAGNOSIS — D631 Anemia in chronic kidney disease: Secondary | ICD-10-CM | POA: Diagnosis not present

## 2020-11-12 DIAGNOSIS — D631 Anemia in chronic kidney disease: Secondary | ICD-10-CM | POA: Diagnosis not present

## 2020-11-12 DIAGNOSIS — N2581 Secondary hyperparathyroidism of renal origin: Secondary | ICD-10-CM | POA: Diagnosis not present

## 2020-11-12 DIAGNOSIS — N186 End stage renal disease: Secondary | ICD-10-CM | POA: Diagnosis not present

## 2020-11-12 DIAGNOSIS — Z23 Encounter for immunization: Secondary | ICD-10-CM | POA: Diagnosis not present

## 2020-11-12 DIAGNOSIS — E876 Hypokalemia: Secondary | ICD-10-CM | POA: Diagnosis not present

## 2020-11-12 DIAGNOSIS — Z992 Dependence on renal dialysis: Secondary | ICD-10-CM | POA: Diagnosis not present

## 2020-11-12 DIAGNOSIS — D509 Iron deficiency anemia, unspecified: Secondary | ICD-10-CM | POA: Diagnosis not present

## 2020-11-15 DIAGNOSIS — D631 Anemia in chronic kidney disease: Secondary | ICD-10-CM | POA: Diagnosis not present

## 2020-11-15 DIAGNOSIS — N186 End stage renal disease: Secondary | ICD-10-CM | POA: Diagnosis not present

## 2020-11-15 DIAGNOSIS — D509 Iron deficiency anemia, unspecified: Secondary | ICD-10-CM | POA: Diagnosis not present

## 2020-11-15 DIAGNOSIS — N2581 Secondary hyperparathyroidism of renal origin: Secondary | ICD-10-CM | POA: Diagnosis not present

## 2020-11-15 DIAGNOSIS — Z23 Encounter for immunization: Secondary | ICD-10-CM | POA: Diagnosis not present

## 2020-11-15 DIAGNOSIS — E876 Hypokalemia: Secondary | ICD-10-CM | POA: Diagnosis not present

## 2020-11-15 DIAGNOSIS — Z992 Dependence on renal dialysis: Secondary | ICD-10-CM | POA: Diagnosis not present

## 2020-11-17 DIAGNOSIS — E876 Hypokalemia: Secondary | ICD-10-CM | POA: Diagnosis not present

## 2020-11-17 DIAGNOSIS — D509 Iron deficiency anemia, unspecified: Secondary | ICD-10-CM | POA: Diagnosis not present

## 2020-11-17 DIAGNOSIS — N2581 Secondary hyperparathyroidism of renal origin: Secondary | ICD-10-CM | POA: Diagnosis not present

## 2020-11-17 DIAGNOSIS — Z992 Dependence on renal dialysis: Secondary | ICD-10-CM | POA: Diagnosis not present

## 2020-11-17 DIAGNOSIS — N186 End stage renal disease: Secondary | ICD-10-CM | POA: Diagnosis not present

## 2020-11-17 DIAGNOSIS — Z23 Encounter for immunization: Secondary | ICD-10-CM | POA: Diagnosis not present

## 2020-11-17 DIAGNOSIS — D631 Anemia in chronic kidney disease: Secondary | ICD-10-CM | POA: Diagnosis not present

## 2020-11-19 DIAGNOSIS — N186 End stage renal disease: Secondary | ICD-10-CM | POA: Diagnosis not present

## 2020-11-19 DIAGNOSIS — N2581 Secondary hyperparathyroidism of renal origin: Secondary | ICD-10-CM | POA: Diagnosis not present

## 2020-11-19 DIAGNOSIS — D509 Iron deficiency anemia, unspecified: Secondary | ICD-10-CM | POA: Diagnosis not present

## 2020-11-19 DIAGNOSIS — Z23 Encounter for immunization: Secondary | ICD-10-CM | POA: Diagnosis not present

## 2020-11-19 DIAGNOSIS — D631 Anemia in chronic kidney disease: Secondary | ICD-10-CM | POA: Diagnosis not present

## 2020-11-19 DIAGNOSIS — Z992 Dependence on renal dialysis: Secondary | ICD-10-CM | POA: Diagnosis not present

## 2020-11-19 DIAGNOSIS — E876 Hypokalemia: Secondary | ICD-10-CM | POA: Diagnosis not present

## 2020-11-22 DIAGNOSIS — Z23 Encounter for immunization: Secondary | ICD-10-CM | POA: Diagnosis not present

## 2020-11-22 DIAGNOSIS — D509 Iron deficiency anemia, unspecified: Secondary | ICD-10-CM | POA: Diagnosis not present

## 2020-11-22 DIAGNOSIS — Z992 Dependence on renal dialysis: Secondary | ICD-10-CM | POA: Diagnosis not present

## 2020-11-22 DIAGNOSIS — N2581 Secondary hyperparathyroidism of renal origin: Secondary | ICD-10-CM | POA: Diagnosis not present

## 2020-11-22 DIAGNOSIS — N186 End stage renal disease: Secondary | ICD-10-CM | POA: Diagnosis not present

## 2020-11-22 DIAGNOSIS — E876 Hypokalemia: Secondary | ICD-10-CM | POA: Diagnosis not present

## 2020-11-22 DIAGNOSIS — D631 Anemia in chronic kidney disease: Secondary | ICD-10-CM | POA: Diagnosis not present

## 2020-11-24 DIAGNOSIS — D509 Iron deficiency anemia, unspecified: Secondary | ICD-10-CM | POA: Diagnosis not present

## 2020-11-24 DIAGNOSIS — D631 Anemia in chronic kidney disease: Secondary | ICD-10-CM | POA: Diagnosis not present

## 2020-11-24 DIAGNOSIS — E876 Hypokalemia: Secondary | ICD-10-CM | POA: Diagnosis not present

## 2020-11-24 DIAGNOSIS — N186 End stage renal disease: Secondary | ICD-10-CM | POA: Diagnosis not present

## 2020-11-24 DIAGNOSIS — N2581 Secondary hyperparathyroidism of renal origin: Secondary | ICD-10-CM | POA: Diagnosis not present

## 2020-11-24 DIAGNOSIS — Z23 Encounter for immunization: Secondary | ICD-10-CM | POA: Diagnosis not present

## 2020-11-24 DIAGNOSIS — Z992 Dependence on renal dialysis: Secondary | ICD-10-CM | POA: Diagnosis not present

## 2020-11-26 DIAGNOSIS — E876 Hypokalemia: Secondary | ICD-10-CM | POA: Diagnosis not present

## 2020-11-26 DIAGNOSIS — N2581 Secondary hyperparathyroidism of renal origin: Secondary | ICD-10-CM | POA: Diagnosis not present

## 2020-11-26 DIAGNOSIS — D509 Iron deficiency anemia, unspecified: Secondary | ICD-10-CM | POA: Diagnosis not present

## 2020-11-26 DIAGNOSIS — N186 End stage renal disease: Secondary | ICD-10-CM | POA: Diagnosis not present

## 2020-11-26 DIAGNOSIS — Z992 Dependence on renal dialysis: Secondary | ICD-10-CM | POA: Diagnosis not present

## 2020-11-26 DIAGNOSIS — Z23 Encounter for immunization: Secondary | ICD-10-CM | POA: Diagnosis not present

## 2020-11-26 DIAGNOSIS — D631 Anemia in chronic kidney disease: Secondary | ICD-10-CM | POA: Diagnosis not present

## 2020-11-29 DIAGNOSIS — N2581 Secondary hyperparathyroidism of renal origin: Secondary | ICD-10-CM | POA: Diagnosis not present

## 2020-11-29 DIAGNOSIS — D509 Iron deficiency anemia, unspecified: Secondary | ICD-10-CM | POA: Diagnosis not present

## 2020-11-29 DIAGNOSIS — D631 Anemia in chronic kidney disease: Secondary | ICD-10-CM | POA: Diagnosis not present

## 2020-11-29 DIAGNOSIS — N186 End stage renal disease: Secondary | ICD-10-CM | POA: Diagnosis not present

## 2020-11-29 DIAGNOSIS — Z23 Encounter for immunization: Secondary | ICD-10-CM | POA: Diagnosis not present

## 2020-11-29 DIAGNOSIS — E876 Hypokalemia: Secondary | ICD-10-CM | POA: Diagnosis not present

## 2020-11-29 DIAGNOSIS — Z992 Dependence on renal dialysis: Secondary | ICD-10-CM | POA: Diagnosis not present

## 2020-12-01 DIAGNOSIS — N186 End stage renal disease: Secondary | ICD-10-CM | POA: Diagnosis not present

## 2020-12-01 DIAGNOSIS — D631 Anemia in chronic kidney disease: Secondary | ICD-10-CM | POA: Diagnosis not present

## 2020-12-01 DIAGNOSIS — D509 Iron deficiency anemia, unspecified: Secondary | ICD-10-CM | POA: Diagnosis not present

## 2020-12-01 DIAGNOSIS — E876 Hypokalemia: Secondary | ICD-10-CM | POA: Diagnosis not present

## 2020-12-01 DIAGNOSIS — N2581 Secondary hyperparathyroidism of renal origin: Secondary | ICD-10-CM | POA: Diagnosis not present

## 2020-12-01 DIAGNOSIS — Z992 Dependence on renal dialysis: Secondary | ICD-10-CM | POA: Diagnosis not present

## 2020-12-01 DIAGNOSIS — Z23 Encounter for immunization: Secondary | ICD-10-CM | POA: Diagnosis not present

## 2020-12-03 DIAGNOSIS — E876 Hypokalemia: Secondary | ICD-10-CM | POA: Diagnosis not present

## 2020-12-03 DIAGNOSIS — Z23 Encounter for immunization: Secondary | ICD-10-CM | POA: Diagnosis not present

## 2020-12-03 DIAGNOSIS — N186 End stage renal disease: Secondary | ICD-10-CM | POA: Diagnosis not present

## 2020-12-03 DIAGNOSIS — N2581 Secondary hyperparathyroidism of renal origin: Secondary | ICD-10-CM | POA: Diagnosis not present

## 2020-12-03 DIAGNOSIS — D631 Anemia in chronic kidney disease: Secondary | ICD-10-CM | POA: Diagnosis not present

## 2020-12-03 DIAGNOSIS — Z992 Dependence on renal dialysis: Secondary | ICD-10-CM | POA: Diagnosis not present

## 2020-12-03 DIAGNOSIS — D509 Iron deficiency anemia, unspecified: Secondary | ICD-10-CM | POA: Diagnosis not present

## 2020-12-06 DIAGNOSIS — Z992 Dependence on renal dialysis: Secondary | ICD-10-CM | POA: Diagnosis not present

## 2020-12-06 DIAGNOSIS — Z23 Encounter for immunization: Secondary | ICD-10-CM | POA: Diagnosis not present

## 2020-12-06 DIAGNOSIS — D509 Iron deficiency anemia, unspecified: Secondary | ICD-10-CM | POA: Diagnosis not present

## 2020-12-06 DIAGNOSIS — D631 Anemia in chronic kidney disease: Secondary | ICD-10-CM | POA: Diagnosis not present

## 2020-12-06 DIAGNOSIS — E876 Hypokalemia: Secondary | ICD-10-CM | POA: Diagnosis not present

## 2020-12-06 DIAGNOSIS — N186 End stage renal disease: Secondary | ICD-10-CM | POA: Diagnosis not present

## 2020-12-06 DIAGNOSIS — N2581 Secondary hyperparathyroidism of renal origin: Secondary | ICD-10-CM | POA: Diagnosis not present

## 2020-12-08 DIAGNOSIS — D631 Anemia in chronic kidney disease: Secondary | ICD-10-CM | POA: Diagnosis not present

## 2020-12-08 DIAGNOSIS — Z992 Dependence on renal dialysis: Secondary | ICD-10-CM | POA: Diagnosis not present

## 2020-12-08 DIAGNOSIS — D509 Iron deficiency anemia, unspecified: Secondary | ICD-10-CM | POA: Diagnosis not present

## 2020-12-08 DIAGNOSIS — N186 End stage renal disease: Secondary | ICD-10-CM | POA: Diagnosis not present

## 2020-12-08 DIAGNOSIS — I129 Hypertensive chronic kidney disease with stage 1 through stage 4 chronic kidney disease, or unspecified chronic kidney disease: Secondary | ICD-10-CM | POA: Diagnosis not present

## 2020-12-08 DIAGNOSIS — Z23 Encounter for immunization: Secondary | ICD-10-CM | POA: Diagnosis not present

## 2020-12-08 DIAGNOSIS — N2581 Secondary hyperparathyroidism of renal origin: Secondary | ICD-10-CM | POA: Diagnosis not present

## 2020-12-08 DIAGNOSIS — E876 Hypokalemia: Secondary | ICD-10-CM | POA: Diagnosis not present

## 2020-12-10 DIAGNOSIS — R52 Pain, unspecified: Secondary | ICD-10-CM | POA: Diagnosis not present

## 2020-12-10 DIAGNOSIS — D631 Anemia in chronic kidney disease: Secondary | ICD-10-CM | POA: Diagnosis not present

## 2020-12-10 DIAGNOSIS — Z23 Encounter for immunization: Secondary | ICD-10-CM | POA: Diagnosis not present

## 2020-12-10 DIAGNOSIS — E876 Hypokalemia: Secondary | ICD-10-CM | POA: Diagnosis not present

## 2020-12-10 DIAGNOSIS — D509 Iron deficiency anemia, unspecified: Secondary | ICD-10-CM | POA: Diagnosis not present

## 2020-12-10 DIAGNOSIS — N2581 Secondary hyperparathyroidism of renal origin: Secondary | ICD-10-CM | POA: Diagnosis not present

## 2020-12-10 DIAGNOSIS — N186 End stage renal disease: Secondary | ICD-10-CM | POA: Diagnosis not present

## 2020-12-10 DIAGNOSIS — Z992 Dependence on renal dialysis: Secondary | ICD-10-CM | POA: Diagnosis not present

## 2020-12-13 DIAGNOSIS — R52 Pain, unspecified: Secondary | ICD-10-CM | POA: Diagnosis not present

## 2020-12-13 DIAGNOSIS — N186 End stage renal disease: Secondary | ICD-10-CM | POA: Diagnosis not present

## 2020-12-13 DIAGNOSIS — D631 Anemia in chronic kidney disease: Secondary | ICD-10-CM | POA: Diagnosis not present

## 2020-12-13 DIAGNOSIS — Z992 Dependence on renal dialysis: Secondary | ICD-10-CM | POA: Diagnosis not present

## 2020-12-13 DIAGNOSIS — Z23 Encounter for immunization: Secondary | ICD-10-CM | POA: Diagnosis not present

## 2020-12-13 DIAGNOSIS — D509 Iron deficiency anemia, unspecified: Secondary | ICD-10-CM | POA: Diagnosis not present

## 2020-12-13 DIAGNOSIS — E876 Hypokalemia: Secondary | ICD-10-CM | POA: Diagnosis not present

## 2020-12-13 DIAGNOSIS — N2581 Secondary hyperparathyroidism of renal origin: Secondary | ICD-10-CM | POA: Diagnosis not present

## 2020-12-15 DIAGNOSIS — Z992 Dependence on renal dialysis: Secondary | ICD-10-CM | POA: Diagnosis not present

## 2020-12-15 DIAGNOSIS — N2581 Secondary hyperparathyroidism of renal origin: Secondary | ICD-10-CM | POA: Diagnosis not present

## 2020-12-15 DIAGNOSIS — R52 Pain, unspecified: Secondary | ICD-10-CM | POA: Diagnosis not present

## 2020-12-15 DIAGNOSIS — E876 Hypokalemia: Secondary | ICD-10-CM | POA: Diagnosis not present

## 2020-12-15 DIAGNOSIS — D631 Anemia in chronic kidney disease: Secondary | ICD-10-CM | POA: Diagnosis not present

## 2020-12-15 DIAGNOSIS — D509 Iron deficiency anemia, unspecified: Secondary | ICD-10-CM | POA: Diagnosis not present

## 2020-12-15 DIAGNOSIS — Z23 Encounter for immunization: Secondary | ICD-10-CM | POA: Diagnosis not present

## 2020-12-15 DIAGNOSIS — N186 End stage renal disease: Secondary | ICD-10-CM | POA: Diagnosis not present

## 2020-12-17 DIAGNOSIS — D509 Iron deficiency anemia, unspecified: Secondary | ICD-10-CM | POA: Diagnosis not present

## 2020-12-17 DIAGNOSIS — Z23 Encounter for immunization: Secondary | ICD-10-CM | POA: Diagnosis not present

## 2020-12-17 DIAGNOSIS — R52 Pain, unspecified: Secondary | ICD-10-CM | POA: Diagnosis not present

## 2020-12-17 DIAGNOSIS — D631 Anemia in chronic kidney disease: Secondary | ICD-10-CM | POA: Diagnosis not present

## 2020-12-17 DIAGNOSIS — N186 End stage renal disease: Secondary | ICD-10-CM | POA: Diagnosis not present

## 2020-12-17 DIAGNOSIS — E876 Hypokalemia: Secondary | ICD-10-CM | POA: Diagnosis not present

## 2020-12-17 DIAGNOSIS — N2581 Secondary hyperparathyroidism of renal origin: Secondary | ICD-10-CM | POA: Diagnosis not present

## 2020-12-17 DIAGNOSIS — Z992 Dependence on renal dialysis: Secondary | ICD-10-CM | POA: Diagnosis not present

## 2020-12-20 DIAGNOSIS — E876 Hypokalemia: Secondary | ICD-10-CM | POA: Diagnosis not present

## 2020-12-20 DIAGNOSIS — N2581 Secondary hyperparathyroidism of renal origin: Secondary | ICD-10-CM | POA: Diagnosis not present

## 2020-12-20 DIAGNOSIS — R52 Pain, unspecified: Secondary | ICD-10-CM | POA: Diagnosis not present

## 2020-12-20 DIAGNOSIS — D631 Anemia in chronic kidney disease: Secondary | ICD-10-CM | POA: Diagnosis not present

## 2020-12-20 DIAGNOSIS — Z23 Encounter for immunization: Secondary | ICD-10-CM | POA: Diagnosis not present

## 2020-12-20 DIAGNOSIS — N186 End stage renal disease: Secondary | ICD-10-CM | POA: Diagnosis not present

## 2020-12-20 DIAGNOSIS — D509 Iron deficiency anemia, unspecified: Secondary | ICD-10-CM | POA: Diagnosis not present

## 2020-12-20 DIAGNOSIS — Z992 Dependence on renal dialysis: Secondary | ICD-10-CM | POA: Diagnosis not present

## 2020-12-22 DIAGNOSIS — N186 End stage renal disease: Secondary | ICD-10-CM | POA: Diagnosis not present

## 2020-12-22 DIAGNOSIS — Z23 Encounter for immunization: Secondary | ICD-10-CM | POA: Diagnosis not present

## 2020-12-22 DIAGNOSIS — D509 Iron deficiency anemia, unspecified: Secondary | ICD-10-CM | POA: Diagnosis not present

## 2020-12-22 DIAGNOSIS — R52 Pain, unspecified: Secondary | ICD-10-CM | POA: Diagnosis not present

## 2020-12-22 DIAGNOSIS — N2581 Secondary hyperparathyroidism of renal origin: Secondary | ICD-10-CM | POA: Diagnosis not present

## 2020-12-22 DIAGNOSIS — Z992 Dependence on renal dialysis: Secondary | ICD-10-CM | POA: Diagnosis not present

## 2020-12-22 DIAGNOSIS — E876 Hypokalemia: Secondary | ICD-10-CM | POA: Diagnosis not present

## 2020-12-22 DIAGNOSIS — D631 Anemia in chronic kidney disease: Secondary | ICD-10-CM | POA: Diagnosis not present

## 2020-12-24 DIAGNOSIS — N2581 Secondary hyperparathyroidism of renal origin: Secondary | ICD-10-CM | POA: Diagnosis not present

## 2020-12-24 DIAGNOSIS — R52 Pain, unspecified: Secondary | ICD-10-CM | POA: Diagnosis not present

## 2020-12-24 DIAGNOSIS — N186 End stage renal disease: Secondary | ICD-10-CM | POA: Diagnosis not present

## 2020-12-24 DIAGNOSIS — E876 Hypokalemia: Secondary | ICD-10-CM | POA: Diagnosis not present

## 2020-12-24 DIAGNOSIS — D631 Anemia in chronic kidney disease: Secondary | ICD-10-CM | POA: Diagnosis not present

## 2020-12-24 DIAGNOSIS — Z992 Dependence on renal dialysis: Secondary | ICD-10-CM | POA: Diagnosis not present

## 2020-12-24 DIAGNOSIS — D509 Iron deficiency anemia, unspecified: Secondary | ICD-10-CM | POA: Diagnosis not present

## 2020-12-24 DIAGNOSIS — Z23 Encounter for immunization: Secondary | ICD-10-CM | POA: Diagnosis not present

## 2020-12-25 ENCOUNTER — Other Ambulatory Visit: Payer: Self-pay | Admitting: Cardiovascular Disease

## 2020-12-27 DIAGNOSIS — R52 Pain, unspecified: Secondary | ICD-10-CM | POA: Diagnosis not present

## 2020-12-27 DIAGNOSIS — N2581 Secondary hyperparathyroidism of renal origin: Secondary | ICD-10-CM | POA: Diagnosis not present

## 2020-12-27 DIAGNOSIS — E876 Hypokalemia: Secondary | ICD-10-CM | POA: Diagnosis not present

## 2020-12-27 DIAGNOSIS — Z23 Encounter for immunization: Secondary | ICD-10-CM | POA: Diagnosis not present

## 2020-12-27 DIAGNOSIS — D509 Iron deficiency anemia, unspecified: Secondary | ICD-10-CM | POA: Diagnosis not present

## 2020-12-27 DIAGNOSIS — Z992 Dependence on renal dialysis: Secondary | ICD-10-CM | POA: Diagnosis not present

## 2020-12-27 DIAGNOSIS — D631 Anemia in chronic kidney disease: Secondary | ICD-10-CM | POA: Diagnosis not present

## 2020-12-27 DIAGNOSIS — N186 End stage renal disease: Secondary | ICD-10-CM | POA: Diagnosis not present

## 2020-12-28 ENCOUNTER — Encounter: Payer: Self-pay | Admitting: Cardiovascular Disease

## 2020-12-29 DIAGNOSIS — R52 Pain, unspecified: Secondary | ICD-10-CM | POA: Diagnosis not present

## 2020-12-29 DIAGNOSIS — D509 Iron deficiency anemia, unspecified: Secondary | ICD-10-CM | POA: Diagnosis not present

## 2020-12-29 DIAGNOSIS — N2581 Secondary hyperparathyroidism of renal origin: Secondary | ICD-10-CM | POA: Diagnosis not present

## 2020-12-29 DIAGNOSIS — D631 Anemia in chronic kidney disease: Secondary | ICD-10-CM | POA: Diagnosis not present

## 2020-12-29 DIAGNOSIS — N186 End stage renal disease: Secondary | ICD-10-CM | POA: Diagnosis not present

## 2020-12-29 DIAGNOSIS — Z23 Encounter for immunization: Secondary | ICD-10-CM | POA: Diagnosis not present

## 2020-12-29 DIAGNOSIS — E876 Hypokalemia: Secondary | ICD-10-CM | POA: Diagnosis not present

## 2020-12-29 DIAGNOSIS — Z992 Dependence on renal dialysis: Secondary | ICD-10-CM | POA: Diagnosis not present

## 2020-12-31 DIAGNOSIS — R52 Pain, unspecified: Secondary | ICD-10-CM | POA: Diagnosis not present

## 2020-12-31 DIAGNOSIS — N186 End stage renal disease: Secondary | ICD-10-CM | POA: Diagnosis not present

## 2020-12-31 DIAGNOSIS — N2581 Secondary hyperparathyroidism of renal origin: Secondary | ICD-10-CM | POA: Diagnosis not present

## 2020-12-31 DIAGNOSIS — Z992 Dependence on renal dialysis: Secondary | ICD-10-CM | POA: Diagnosis not present

## 2020-12-31 DIAGNOSIS — D631 Anemia in chronic kidney disease: Secondary | ICD-10-CM | POA: Diagnosis not present

## 2020-12-31 DIAGNOSIS — D509 Iron deficiency anemia, unspecified: Secondary | ICD-10-CM | POA: Diagnosis not present

## 2020-12-31 DIAGNOSIS — E876 Hypokalemia: Secondary | ICD-10-CM | POA: Diagnosis not present

## 2020-12-31 DIAGNOSIS — Z23 Encounter for immunization: Secondary | ICD-10-CM | POA: Diagnosis not present

## 2021-01-03 DIAGNOSIS — D631 Anemia in chronic kidney disease: Secondary | ICD-10-CM | POA: Diagnosis not present

## 2021-01-03 DIAGNOSIS — Z992 Dependence on renal dialysis: Secondary | ICD-10-CM | POA: Diagnosis not present

## 2021-01-03 DIAGNOSIS — R52 Pain, unspecified: Secondary | ICD-10-CM | POA: Diagnosis not present

## 2021-01-03 DIAGNOSIS — E876 Hypokalemia: Secondary | ICD-10-CM | POA: Diagnosis not present

## 2021-01-03 DIAGNOSIS — D509 Iron deficiency anemia, unspecified: Secondary | ICD-10-CM | POA: Diagnosis not present

## 2021-01-03 DIAGNOSIS — N186 End stage renal disease: Secondary | ICD-10-CM | POA: Diagnosis not present

## 2021-01-03 DIAGNOSIS — Z23 Encounter for immunization: Secondary | ICD-10-CM | POA: Diagnosis not present

## 2021-01-03 DIAGNOSIS — N2581 Secondary hyperparathyroidism of renal origin: Secondary | ICD-10-CM | POA: Diagnosis not present

## 2021-01-05 DIAGNOSIS — Z23 Encounter for immunization: Secondary | ICD-10-CM | POA: Diagnosis not present

## 2021-01-05 DIAGNOSIS — E876 Hypokalemia: Secondary | ICD-10-CM | POA: Diagnosis not present

## 2021-01-05 DIAGNOSIS — R52 Pain, unspecified: Secondary | ICD-10-CM | POA: Diagnosis not present

## 2021-01-05 DIAGNOSIS — D509 Iron deficiency anemia, unspecified: Secondary | ICD-10-CM | POA: Diagnosis not present

## 2021-01-05 DIAGNOSIS — N186 End stage renal disease: Secondary | ICD-10-CM | POA: Diagnosis not present

## 2021-01-05 DIAGNOSIS — Z992 Dependence on renal dialysis: Secondary | ICD-10-CM | POA: Diagnosis not present

## 2021-01-05 DIAGNOSIS — N2581 Secondary hyperparathyroidism of renal origin: Secondary | ICD-10-CM | POA: Diagnosis not present

## 2021-01-05 DIAGNOSIS — D631 Anemia in chronic kidney disease: Secondary | ICD-10-CM | POA: Diagnosis not present

## 2021-01-07 DIAGNOSIS — Z992 Dependence on renal dialysis: Secondary | ICD-10-CM | POA: Diagnosis not present

## 2021-01-07 DIAGNOSIS — M1990 Unspecified osteoarthritis, unspecified site: Secondary | ICD-10-CM | POA: Diagnosis not present

## 2021-01-07 DIAGNOSIS — E876 Hypokalemia: Secondary | ICD-10-CM | POA: Diagnosis not present

## 2021-01-07 DIAGNOSIS — N2581 Secondary hyperparathyroidism of renal origin: Secondary | ICD-10-CM | POA: Diagnosis not present

## 2021-01-07 DIAGNOSIS — I2510 Atherosclerotic heart disease of native coronary artery without angina pectoris: Secondary | ICD-10-CM | POA: Diagnosis not present

## 2021-01-07 DIAGNOSIS — R52 Pain, unspecified: Secondary | ICD-10-CM | POA: Diagnosis not present

## 2021-01-07 DIAGNOSIS — Z23 Encounter for immunization: Secondary | ICD-10-CM | POA: Diagnosis not present

## 2021-01-07 DIAGNOSIS — D631 Anemia in chronic kidney disease: Secondary | ICD-10-CM | POA: Diagnosis not present

## 2021-01-07 DIAGNOSIS — E785 Hyperlipidemia, unspecified: Secondary | ICD-10-CM | POA: Diagnosis not present

## 2021-01-07 DIAGNOSIS — N186 End stage renal disease: Secondary | ICD-10-CM | POA: Diagnosis not present

## 2021-01-07 DIAGNOSIS — D509 Iron deficiency anemia, unspecified: Secondary | ICD-10-CM | POA: Diagnosis not present

## 2021-01-08 DIAGNOSIS — Z992 Dependence on renal dialysis: Secondary | ICD-10-CM | POA: Diagnosis not present

## 2021-01-08 DIAGNOSIS — N186 End stage renal disease: Secondary | ICD-10-CM | POA: Diagnosis not present

## 2021-01-08 DIAGNOSIS — I129 Hypertensive chronic kidney disease with stage 1 through stage 4 chronic kidney disease, or unspecified chronic kidney disease: Secondary | ICD-10-CM | POA: Diagnosis not present

## 2021-01-10 DIAGNOSIS — Z992 Dependence on renal dialysis: Secondary | ICD-10-CM | POA: Diagnosis not present

## 2021-01-10 DIAGNOSIS — Z23 Encounter for immunization: Secondary | ICD-10-CM | POA: Diagnosis not present

## 2021-01-10 DIAGNOSIS — D509 Iron deficiency anemia, unspecified: Secondary | ICD-10-CM | POA: Diagnosis not present

## 2021-01-10 DIAGNOSIS — D631 Anemia in chronic kidney disease: Secondary | ICD-10-CM | POA: Diagnosis not present

## 2021-01-10 DIAGNOSIS — N186 End stage renal disease: Secondary | ICD-10-CM | POA: Diagnosis not present

## 2021-01-10 DIAGNOSIS — R52 Pain, unspecified: Secondary | ICD-10-CM | POA: Diagnosis not present

## 2021-01-10 DIAGNOSIS — N2581 Secondary hyperparathyroidism of renal origin: Secondary | ICD-10-CM | POA: Diagnosis not present

## 2021-01-10 DIAGNOSIS — E876 Hypokalemia: Secondary | ICD-10-CM | POA: Diagnosis not present

## 2021-01-12 DIAGNOSIS — N186 End stage renal disease: Secondary | ICD-10-CM | POA: Diagnosis not present

## 2021-01-12 DIAGNOSIS — Z992 Dependence on renal dialysis: Secondary | ICD-10-CM | POA: Diagnosis not present

## 2021-01-12 DIAGNOSIS — R52 Pain, unspecified: Secondary | ICD-10-CM | POA: Diagnosis not present

## 2021-01-12 DIAGNOSIS — E876 Hypokalemia: Secondary | ICD-10-CM | POA: Diagnosis not present

## 2021-01-12 DIAGNOSIS — D631 Anemia in chronic kidney disease: Secondary | ICD-10-CM | POA: Diagnosis not present

## 2021-01-12 DIAGNOSIS — N2581 Secondary hyperparathyroidism of renal origin: Secondary | ICD-10-CM | POA: Diagnosis not present

## 2021-01-12 DIAGNOSIS — Z23 Encounter for immunization: Secondary | ICD-10-CM | POA: Diagnosis not present

## 2021-01-12 DIAGNOSIS — D509 Iron deficiency anemia, unspecified: Secondary | ICD-10-CM | POA: Diagnosis not present

## 2021-01-13 DIAGNOSIS — K8020 Calculus of gallbladder without cholecystitis without obstruction: Secondary | ICD-10-CM | POA: Diagnosis not present

## 2021-01-13 DIAGNOSIS — J90 Pleural effusion, not elsewhere classified: Secondary | ICD-10-CM | POA: Diagnosis not present

## 2021-01-13 DIAGNOSIS — K7689 Other specified diseases of liver: Secondary | ICD-10-CM | POA: Diagnosis not present

## 2021-01-13 DIAGNOSIS — J9811 Atelectasis: Secondary | ICD-10-CM | POA: Diagnosis not present

## 2021-01-13 DIAGNOSIS — C641 Malignant neoplasm of right kidney, except renal pelvis: Secondary | ICD-10-CM | POA: Diagnosis not present

## 2021-01-13 DIAGNOSIS — I700 Atherosclerosis of aorta: Secondary | ICD-10-CM | POA: Diagnosis not present

## 2021-01-13 DIAGNOSIS — I2510 Atherosclerotic heart disease of native coronary artery without angina pectoris: Secondary | ICD-10-CM | POA: Diagnosis not present

## 2021-01-13 DIAGNOSIS — K5730 Diverticulosis of large intestine without perforation or abscess without bleeding: Secondary | ICD-10-CM | POA: Diagnosis not present

## 2021-01-14 DIAGNOSIS — E876 Hypokalemia: Secondary | ICD-10-CM | POA: Diagnosis not present

## 2021-01-14 DIAGNOSIS — D509 Iron deficiency anemia, unspecified: Secondary | ICD-10-CM | POA: Diagnosis not present

## 2021-01-14 DIAGNOSIS — N2581 Secondary hyperparathyroidism of renal origin: Secondary | ICD-10-CM | POA: Diagnosis not present

## 2021-01-14 DIAGNOSIS — N186 End stage renal disease: Secondary | ICD-10-CM | POA: Diagnosis not present

## 2021-01-14 DIAGNOSIS — Z992 Dependence on renal dialysis: Secondary | ICD-10-CM | POA: Diagnosis not present

## 2021-01-14 DIAGNOSIS — D631 Anemia in chronic kidney disease: Secondary | ICD-10-CM | POA: Diagnosis not present

## 2021-01-14 DIAGNOSIS — R52 Pain, unspecified: Secondary | ICD-10-CM | POA: Diagnosis not present

## 2021-01-14 DIAGNOSIS — Z23 Encounter for immunization: Secondary | ICD-10-CM | POA: Diagnosis not present

## 2021-01-17 DIAGNOSIS — D631 Anemia in chronic kidney disease: Secondary | ICD-10-CM | POA: Diagnosis not present

## 2021-01-17 DIAGNOSIS — Z23 Encounter for immunization: Secondary | ICD-10-CM | POA: Diagnosis not present

## 2021-01-17 DIAGNOSIS — Z992 Dependence on renal dialysis: Secondary | ICD-10-CM | POA: Diagnosis not present

## 2021-01-17 DIAGNOSIS — N2581 Secondary hyperparathyroidism of renal origin: Secondary | ICD-10-CM | POA: Diagnosis not present

## 2021-01-17 DIAGNOSIS — N186 End stage renal disease: Secondary | ICD-10-CM | POA: Diagnosis not present

## 2021-01-17 DIAGNOSIS — E876 Hypokalemia: Secondary | ICD-10-CM | POA: Diagnosis not present

## 2021-01-17 DIAGNOSIS — D509 Iron deficiency anemia, unspecified: Secondary | ICD-10-CM | POA: Diagnosis not present

## 2021-01-17 DIAGNOSIS — R52 Pain, unspecified: Secondary | ICD-10-CM | POA: Diagnosis not present

## 2021-01-18 DIAGNOSIS — C641 Malignant neoplasm of right kidney, except renal pelvis: Secondary | ICD-10-CM | POA: Diagnosis not present

## 2021-01-19 DIAGNOSIS — R52 Pain, unspecified: Secondary | ICD-10-CM | POA: Diagnosis not present

## 2021-01-19 DIAGNOSIS — N2581 Secondary hyperparathyroidism of renal origin: Secondary | ICD-10-CM | POA: Diagnosis not present

## 2021-01-19 DIAGNOSIS — D509 Iron deficiency anemia, unspecified: Secondary | ICD-10-CM | POA: Diagnosis not present

## 2021-01-19 DIAGNOSIS — Z23 Encounter for immunization: Secondary | ICD-10-CM | POA: Diagnosis not present

## 2021-01-19 DIAGNOSIS — E876 Hypokalemia: Secondary | ICD-10-CM | POA: Diagnosis not present

## 2021-01-19 DIAGNOSIS — D631 Anemia in chronic kidney disease: Secondary | ICD-10-CM | POA: Diagnosis not present

## 2021-01-19 DIAGNOSIS — N186 End stage renal disease: Secondary | ICD-10-CM | POA: Diagnosis not present

## 2021-01-19 DIAGNOSIS — Z992 Dependence on renal dialysis: Secondary | ICD-10-CM | POA: Diagnosis not present

## 2021-01-21 DIAGNOSIS — Z992 Dependence on renal dialysis: Secondary | ICD-10-CM | POA: Diagnosis not present

## 2021-01-21 DIAGNOSIS — R52 Pain, unspecified: Secondary | ICD-10-CM | POA: Diagnosis not present

## 2021-01-21 DIAGNOSIS — N186 End stage renal disease: Secondary | ICD-10-CM | POA: Diagnosis not present

## 2021-01-21 DIAGNOSIS — Z23 Encounter for immunization: Secondary | ICD-10-CM | POA: Diagnosis not present

## 2021-01-21 DIAGNOSIS — D509 Iron deficiency anemia, unspecified: Secondary | ICD-10-CM | POA: Diagnosis not present

## 2021-01-21 DIAGNOSIS — D631 Anemia in chronic kidney disease: Secondary | ICD-10-CM | POA: Diagnosis not present

## 2021-01-21 DIAGNOSIS — N2581 Secondary hyperparathyroidism of renal origin: Secondary | ICD-10-CM | POA: Diagnosis not present

## 2021-01-21 DIAGNOSIS — E876 Hypokalemia: Secondary | ICD-10-CM | POA: Diagnosis not present

## 2021-01-24 DIAGNOSIS — N186 End stage renal disease: Secondary | ICD-10-CM | POA: Diagnosis not present

## 2021-01-24 DIAGNOSIS — E876 Hypokalemia: Secondary | ICD-10-CM | POA: Diagnosis not present

## 2021-01-24 DIAGNOSIS — Z23 Encounter for immunization: Secondary | ICD-10-CM | POA: Diagnosis not present

## 2021-01-24 DIAGNOSIS — D631 Anemia in chronic kidney disease: Secondary | ICD-10-CM | POA: Diagnosis not present

## 2021-01-24 DIAGNOSIS — Z992 Dependence on renal dialysis: Secondary | ICD-10-CM | POA: Diagnosis not present

## 2021-01-24 DIAGNOSIS — R52 Pain, unspecified: Secondary | ICD-10-CM | POA: Diagnosis not present

## 2021-01-24 DIAGNOSIS — N2581 Secondary hyperparathyroidism of renal origin: Secondary | ICD-10-CM | POA: Diagnosis not present

## 2021-01-24 DIAGNOSIS — D509 Iron deficiency anemia, unspecified: Secondary | ICD-10-CM | POA: Diagnosis not present

## 2021-01-26 DIAGNOSIS — Z992 Dependence on renal dialysis: Secondary | ICD-10-CM | POA: Diagnosis not present

## 2021-01-26 DIAGNOSIS — N2581 Secondary hyperparathyroidism of renal origin: Secondary | ICD-10-CM | POA: Diagnosis not present

## 2021-01-26 DIAGNOSIS — D631 Anemia in chronic kidney disease: Secondary | ICD-10-CM | POA: Diagnosis not present

## 2021-01-26 DIAGNOSIS — Z23 Encounter for immunization: Secondary | ICD-10-CM | POA: Diagnosis not present

## 2021-01-26 DIAGNOSIS — E876 Hypokalemia: Secondary | ICD-10-CM | POA: Diagnosis not present

## 2021-01-26 DIAGNOSIS — R52 Pain, unspecified: Secondary | ICD-10-CM | POA: Diagnosis not present

## 2021-01-26 DIAGNOSIS — D509 Iron deficiency anemia, unspecified: Secondary | ICD-10-CM | POA: Diagnosis not present

## 2021-01-26 DIAGNOSIS — N186 End stage renal disease: Secondary | ICD-10-CM | POA: Diagnosis not present

## 2021-01-28 DIAGNOSIS — Z23 Encounter for immunization: Secondary | ICD-10-CM | POA: Diagnosis not present

## 2021-01-28 DIAGNOSIS — N186 End stage renal disease: Secondary | ICD-10-CM | POA: Diagnosis not present

## 2021-01-28 DIAGNOSIS — Z992 Dependence on renal dialysis: Secondary | ICD-10-CM | POA: Diagnosis not present

## 2021-01-28 DIAGNOSIS — N2581 Secondary hyperparathyroidism of renal origin: Secondary | ICD-10-CM | POA: Diagnosis not present

## 2021-01-28 DIAGNOSIS — E876 Hypokalemia: Secondary | ICD-10-CM | POA: Diagnosis not present

## 2021-01-28 DIAGNOSIS — D631 Anemia in chronic kidney disease: Secondary | ICD-10-CM | POA: Diagnosis not present

## 2021-01-28 DIAGNOSIS — R52 Pain, unspecified: Secondary | ICD-10-CM | POA: Diagnosis not present

## 2021-01-28 DIAGNOSIS — D509 Iron deficiency anemia, unspecified: Secondary | ICD-10-CM | POA: Diagnosis not present

## 2021-01-29 ENCOUNTER — Other Ambulatory Visit: Payer: Self-pay | Admitting: Cardiovascular Disease

## 2021-01-31 DIAGNOSIS — N186 End stage renal disease: Secondary | ICD-10-CM | POA: Diagnosis not present

## 2021-01-31 DIAGNOSIS — D509 Iron deficiency anemia, unspecified: Secondary | ICD-10-CM | POA: Diagnosis not present

## 2021-01-31 DIAGNOSIS — Z992 Dependence on renal dialysis: Secondary | ICD-10-CM | POA: Diagnosis not present

## 2021-01-31 DIAGNOSIS — E876 Hypokalemia: Secondary | ICD-10-CM | POA: Diagnosis not present

## 2021-01-31 DIAGNOSIS — N2581 Secondary hyperparathyroidism of renal origin: Secondary | ICD-10-CM | POA: Diagnosis not present

## 2021-01-31 DIAGNOSIS — D631 Anemia in chronic kidney disease: Secondary | ICD-10-CM | POA: Diagnosis not present

## 2021-01-31 DIAGNOSIS — Z23 Encounter for immunization: Secondary | ICD-10-CM | POA: Diagnosis not present

## 2021-01-31 DIAGNOSIS — R52 Pain, unspecified: Secondary | ICD-10-CM | POA: Diagnosis not present

## 2021-02-02 DIAGNOSIS — D509 Iron deficiency anemia, unspecified: Secondary | ICD-10-CM | POA: Diagnosis not present

## 2021-02-02 DIAGNOSIS — R52 Pain, unspecified: Secondary | ICD-10-CM | POA: Diagnosis not present

## 2021-02-02 DIAGNOSIS — E876 Hypokalemia: Secondary | ICD-10-CM | POA: Diagnosis not present

## 2021-02-02 DIAGNOSIS — Z23 Encounter for immunization: Secondary | ICD-10-CM | POA: Diagnosis not present

## 2021-02-02 DIAGNOSIS — N2581 Secondary hyperparathyroidism of renal origin: Secondary | ICD-10-CM | POA: Diagnosis not present

## 2021-02-02 DIAGNOSIS — N186 End stage renal disease: Secondary | ICD-10-CM | POA: Diagnosis not present

## 2021-02-02 DIAGNOSIS — D631 Anemia in chronic kidney disease: Secondary | ICD-10-CM | POA: Diagnosis not present

## 2021-02-02 DIAGNOSIS — Z992 Dependence on renal dialysis: Secondary | ICD-10-CM | POA: Diagnosis not present

## 2021-02-03 ENCOUNTER — Ambulatory Visit (INDEPENDENT_AMBULATORY_CARE_PROVIDER_SITE_OTHER): Payer: Medicare Other

## 2021-02-03 DIAGNOSIS — I441 Atrioventricular block, second degree: Secondary | ICD-10-CM | POA: Diagnosis not present

## 2021-02-03 LAB — CUP PACEART REMOTE DEVICE CHECK
Battery Remaining Longevity: 49 mo
Battery Voltage: 2.89 V
Brady Statistic AP VP Percent: 74.65 %
Brady Statistic AP VS Percent: 0.2 %
Brady Statistic AS VP Percent: 24.21 %
Brady Statistic AS VS Percent: 0.94 %
Brady Statistic RA Percent Paced: 73.68 %
Brady Statistic RV Percent Paced: 98.83 %
Date Time Interrogation Session: 20230126084750
Implantable Lead Implant Date: 20181217
Implantable Lead Implant Date: 20181217
Implantable Lead Location: 753859
Implantable Lead Location: 753859
Implantable Lead Model: 3830
Implantable Lead Model: 5076
Implantable Pulse Generator Implant Date: 20181217
Lead Channel Impedance Value: 285 Ohm
Lead Channel Impedance Value: 304 Ohm
Lead Channel Impedance Value: 399 Ohm
Lead Channel Impedance Value: 418 Ohm
Lead Channel Pacing Threshold Amplitude: 0.5 V
Lead Channel Pacing Threshold Amplitude: 0.625 V
Lead Channel Pacing Threshold Pulse Width: 0.4 ms
Lead Channel Pacing Threshold Pulse Width: 0.4 ms
Lead Channel Sensing Intrinsic Amplitude: 0.5 mV
Lead Channel Sensing Intrinsic Amplitude: 0.5 mV
Lead Channel Sensing Intrinsic Amplitude: 13.875 mV
Lead Channel Sensing Intrinsic Amplitude: 13.875 mV
Lead Channel Setting Pacing Amplitude: 2 V
Lead Channel Setting Pacing Amplitude: 2.5 V
Lead Channel Setting Pacing Pulse Width: 1 ms
Lead Channel Setting Sensing Sensitivity: 1.2 mV

## 2021-02-04 DIAGNOSIS — Z23 Encounter for immunization: Secondary | ICD-10-CM | POA: Diagnosis not present

## 2021-02-04 DIAGNOSIS — D509 Iron deficiency anemia, unspecified: Secondary | ICD-10-CM | POA: Diagnosis not present

## 2021-02-04 DIAGNOSIS — D631 Anemia in chronic kidney disease: Secondary | ICD-10-CM | POA: Diagnosis not present

## 2021-02-04 DIAGNOSIS — E876 Hypokalemia: Secondary | ICD-10-CM | POA: Diagnosis not present

## 2021-02-04 DIAGNOSIS — R52 Pain, unspecified: Secondary | ICD-10-CM | POA: Diagnosis not present

## 2021-02-04 DIAGNOSIS — Z992 Dependence on renal dialysis: Secondary | ICD-10-CM | POA: Diagnosis not present

## 2021-02-04 DIAGNOSIS — N186 End stage renal disease: Secondary | ICD-10-CM | POA: Diagnosis not present

## 2021-02-04 DIAGNOSIS — N2581 Secondary hyperparathyroidism of renal origin: Secondary | ICD-10-CM | POA: Diagnosis not present

## 2021-02-07 DIAGNOSIS — Z23 Encounter for immunization: Secondary | ICD-10-CM | POA: Diagnosis not present

## 2021-02-07 DIAGNOSIS — Z992 Dependence on renal dialysis: Secondary | ICD-10-CM | POA: Diagnosis not present

## 2021-02-07 DIAGNOSIS — D509 Iron deficiency anemia, unspecified: Secondary | ICD-10-CM | POA: Diagnosis not present

## 2021-02-07 DIAGNOSIS — E876 Hypokalemia: Secondary | ICD-10-CM | POA: Diagnosis not present

## 2021-02-07 DIAGNOSIS — D631 Anemia in chronic kidney disease: Secondary | ICD-10-CM | POA: Diagnosis not present

## 2021-02-07 DIAGNOSIS — N2581 Secondary hyperparathyroidism of renal origin: Secondary | ICD-10-CM | POA: Diagnosis not present

## 2021-02-07 DIAGNOSIS — R52 Pain, unspecified: Secondary | ICD-10-CM | POA: Diagnosis not present

## 2021-02-07 DIAGNOSIS — N186 End stage renal disease: Secondary | ICD-10-CM | POA: Diagnosis not present

## 2021-02-08 DIAGNOSIS — I129 Hypertensive chronic kidney disease with stage 1 through stage 4 chronic kidney disease, or unspecified chronic kidney disease: Secondary | ICD-10-CM | POA: Diagnosis not present

## 2021-02-08 DIAGNOSIS — Z992 Dependence on renal dialysis: Secondary | ICD-10-CM | POA: Diagnosis not present

## 2021-02-08 DIAGNOSIS — E785 Hyperlipidemia, unspecified: Secondary | ICD-10-CM | POA: Diagnosis not present

## 2021-02-08 DIAGNOSIS — I2510 Atherosclerotic heart disease of native coronary artery without angina pectoris: Secondary | ICD-10-CM | POA: Diagnosis not present

## 2021-02-08 DIAGNOSIS — M1990 Unspecified osteoarthritis, unspecified site: Secondary | ICD-10-CM | POA: Diagnosis not present

## 2021-02-08 DIAGNOSIS — N186 End stage renal disease: Secondary | ICD-10-CM | POA: Diagnosis not present

## 2021-02-09 DIAGNOSIS — E876 Hypokalemia: Secondary | ICD-10-CM | POA: Diagnosis not present

## 2021-02-09 DIAGNOSIS — Z992 Dependence on renal dialysis: Secondary | ICD-10-CM | POA: Diagnosis not present

## 2021-02-09 DIAGNOSIS — N186 End stage renal disease: Secondary | ICD-10-CM | POA: Diagnosis not present

## 2021-02-09 DIAGNOSIS — D509 Iron deficiency anemia, unspecified: Secondary | ICD-10-CM | POA: Diagnosis not present

## 2021-02-09 DIAGNOSIS — D631 Anemia in chronic kidney disease: Secondary | ICD-10-CM | POA: Diagnosis not present

## 2021-02-09 DIAGNOSIS — N2581 Secondary hyperparathyroidism of renal origin: Secondary | ICD-10-CM | POA: Diagnosis not present

## 2021-02-11 DIAGNOSIS — N2581 Secondary hyperparathyroidism of renal origin: Secondary | ICD-10-CM | POA: Diagnosis not present

## 2021-02-11 DIAGNOSIS — E876 Hypokalemia: Secondary | ICD-10-CM | POA: Diagnosis not present

## 2021-02-11 DIAGNOSIS — N186 End stage renal disease: Secondary | ICD-10-CM | POA: Diagnosis not present

## 2021-02-11 DIAGNOSIS — Z992 Dependence on renal dialysis: Secondary | ICD-10-CM | POA: Diagnosis not present

## 2021-02-11 DIAGNOSIS — D509 Iron deficiency anemia, unspecified: Secondary | ICD-10-CM | POA: Diagnosis not present

## 2021-02-11 DIAGNOSIS — D631 Anemia in chronic kidney disease: Secondary | ICD-10-CM | POA: Diagnosis not present

## 2021-02-14 DIAGNOSIS — Z992 Dependence on renal dialysis: Secondary | ICD-10-CM | POA: Diagnosis not present

## 2021-02-14 DIAGNOSIS — E876 Hypokalemia: Secondary | ICD-10-CM | POA: Diagnosis not present

## 2021-02-14 DIAGNOSIS — N2581 Secondary hyperparathyroidism of renal origin: Secondary | ICD-10-CM | POA: Diagnosis not present

## 2021-02-14 DIAGNOSIS — D631 Anemia in chronic kidney disease: Secondary | ICD-10-CM | POA: Diagnosis not present

## 2021-02-14 DIAGNOSIS — D509 Iron deficiency anemia, unspecified: Secondary | ICD-10-CM | POA: Diagnosis not present

## 2021-02-14 DIAGNOSIS — N186 End stage renal disease: Secondary | ICD-10-CM | POA: Diagnosis not present

## 2021-02-14 NOTE — Progress Notes (Signed)
Remote pacemaker transmission.   

## 2021-02-15 ENCOUNTER — Ambulatory Visit: Payer: Medicare Other | Admitting: Cardiovascular Disease

## 2021-02-16 DIAGNOSIS — Z992 Dependence on renal dialysis: Secondary | ICD-10-CM | POA: Diagnosis not present

## 2021-02-16 DIAGNOSIS — N2581 Secondary hyperparathyroidism of renal origin: Secondary | ICD-10-CM | POA: Diagnosis not present

## 2021-02-16 DIAGNOSIS — N186 End stage renal disease: Secondary | ICD-10-CM | POA: Diagnosis not present

## 2021-02-16 DIAGNOSIS — D631 Anemia in chronic kidney disease: Secondary | ICD-10-CM | POA: Diagnosis not present

## 2021-02-16 DIAGNOSIS — E876 Hypokalemia: Secondary | ICD-10-CM | POA: Diagnosis not present

## 2021-02-16 DIAGNOSIS — D509 Iron deficiency anemia, unspecified: Secondary | ICD-10-CM | POA: Diagnosis not present

## 2021-02-18 DIAGNOSIS — E876 Hypokalemia: Secondary | ICD-10-CM | POA: Diagnosis not present

## 2021-02-18 DIAGNOSIS — N2581 Secondary hyperparathyroidism of renal origin: Secondary | ICD-10-CM | POA: Diagnosis not present

## 2021-02-18 DIAGNOSIS — Z992 Dependence on renal dialysis: Secondary | ICD-10-CM | POA: Diagnosis not present

## 2021-02-18 DIAGNOSIS — D509 Iron deficiency anemia, unspecified: Secondary | ICD-10-CM | POA: Diagnosis not present

## 2021-02-18 DIAGNOSIS — D631 Anemia in chronic kidney disease: Secondary | ICD-10-CM | POA: Diagnosis not present

## 2021-02-18 DIAGNOSIS — N186 End stage renal disease: Secondary | ICD-10-CM | POA: Diagnosis not present

## 2021-02-21 DIAGNOSIS — N186 End stage renal disease: Secondary | ICD-10-CM | POA: Diagnosis not present

## 2021-02-21 DIAGNOSIS — Z992 Dependence on renal dialysis: Secondary | ICD-10-CM | POA: Diagnosis not present

## 2021-02-21 DIAGNOSIS — E876 Hypokalemia: Secondary | ICD-10-CM | POA: Diagnosis not present

## 2021-02-21 DIAGNOSIS — D631 Anemia in chronic kidney disease: Secondary | ICD-10-CM | POA: Diagnosis not present

## 2021-02-21 DIAGNOSIS — D509 Iron deficiency anemia, unspecified: Secondary | ICD-10-CM | POA: Diagnosis not present

## 2021-02-21 DIAGNOSIS — N2581 Secondary hyperparathyroidism of renal origin: Secondary | ICD-10-CM | POA: Diagnosis not present

## 2021-02-22 DIAGNOSIS — T82858A Stenosis of vascular prosthetic devices, implants and grafts, initial encounter: Secondary | ICD-10-CM | POA: Diagnosis not present

## 2021-02-22 DIAGNOSIS — N186 End stage renal disease: Secondary | ICD-10-CM | POA: Diagnosis not present

## 2021-02-22 DIAGNOSIS — I871 Compression of vein: Secondary | ICD-10-CM | POA: Diagnosis not present

## 2021-02-22 DIAGNOSIS — Z992 Dependence on renal dialysis: Secondary | ICD-10-CM | POA: Diagnosis not present

## 2021-02-23 DIAGNOSIS — N2581 Secondary hyperparathyroidism of renal origin: Secondary | ICD-10-CM | POA: Diagnosis not present

## 2021-02-23 DIAGNOSIS — Z992 Dependence on renal dialysis: Secondary | ICD-10-CM | POA: Diagnosis not present

## 2021-02-23 DIAGNOSIS — D509 Iron deficiency anemia, unspecified: Secondary | ICD-10-CM | POA: Diagnosis not present

## 2021-02-23 DIAGNOSIS — N186 End stage renal disease: Secondary | ICD-10-CM | POA: Diagnosis not present

## 2021-02-23 DIAGNOSIS — E876 Hypokalemia: Secondary | ICD-10-CM | POA: Diagnosis not present

## 2021-02-23 DIAGNOSIS — D631 Anemia in chronic kidney disease: Secondary | ICD-10-CM | POA: Diagnosis not present

## 2021-02-25 DIAGNOSIS — Z992 Dependence on renal dialysis: Secondary | ICD-10-CM | POA: Diagnosis not present

## 2021-02-25 DIAGNOSIS — D509 Iron deficiency anemia, unspecified: Secondary | ICD-10-CM | POA: Diagnosis not present

## 2021-02-25 DIAGNOSIS — N186 End stage renal disease: Secondary | ICD-10-CM | POA: Diagnosis not present

## 2021-02-25 DIAGNOSIS — D631 Anemia in chronic kidney disease: Secondary | ICD-10-CM | POA: Diagnosis not present

## 2021-02-25 DIAGNOSIS — N2581 Secondary hyperparathyroidism of renal origin: Secondary | ICD-10-CM | POA: Diagnosis not present

## 2021-02-25 DIAGNOSIS — E876 Hypokalemia: Secondary | ICD-10-CM | POA: Diagnosis not present

## 2021-02-28 DIAGNOSIS — N2581 Secondary hyperparathyroidism of renal origin: Secondary | ICD-10-CM | POA: Diagnosis not present

## 2021-02-28 DIAGNOSIS — D509 Iron deficiency anemia, unspecified: Secondary | ICD-10-CM | POA: Diagnosis not present

## 2021-02-28 DIAGNOSIS — Z992 Dependence on renal dialysis: Secondary | ICD-10-CM | POA: Diagnosis not present

## 2021-02-28 DIAGNOSIS — N186 End stage renal disease: Secondary | ICD-10-CM | POA: Diagnosis not present

## 2021-02-28 DIAGNOSIS — D631 Anemia in chronic kidney disease: Secondary | ICD-10-CM | POA: Diagnosis not present

## 2021-02-28 DIAGNOSIS — E876 Hypokalemia: Secondary | ICD-10-CM | POA: Diagnosis not present

## 2021-03-02 DIAGNOSIS — N186 End stage renal disease: Secondary | ICD-10-CM | POA: Diagnosis not present

## 2021-03-02 DIAGNOSIS — D509 Iron deficiency anemia, unspecified: Secondary | ICD-10-CM | POA: Diagnosis not present

## 2021-03-02 DIAGNOSIS — N2581 Secondary hyperparathyroidism of renal origin: Secondary | ICD-10-CM | POA: Diagnosis not present

## 2021-03-02 DIAGNOSIS — D631 Anemia in chronic kidney disease: Secondary | ICD-10-CM | POA: Diagnosis not present

## 2021-03-02 DIAGNOSIS — Z992 Dependence on renal dialysis: Secondary | ICD-10-CM | POA: Diagnosis not present

## 2021-03-02 DIAGNOSIS — E876 Hypokalemia: Secondary | ICD-10-CM | POA: Diagnosis not present

## 2021-03-04 DIAGNOSIS — Z992 Dependence on renal dialysis: Secondary | ICD-10-CM | POA: Diagnosis not present

## 2021-03-04 DIAGNOSIS — D631 Anemia in chronic kidney disease: Secondary | ICD-10-CM | POA: Diagnosis not present

## 2021-03-04 DIAGNOSIS — N186 End stage renal disease: Secondary | ICD-10-CM | POA: Diagnosis not present

## 2021-03-04 DIAGNOSIS — E876 Hypokalemia: Secondary | ICD-10-CM | POA: Diagnosis not present

## 2021-03-04 DIAGNOSIS — N2581 Secondary hyperparathyroidism of renal origin: Secondary | ICD-10-CM | POA: Diagnosis not present

## 2021-03-04 DIAGNOSIS — D509 Iron deficiency anemia, unspecified: Secondary | ICD-10-CM | POA: Diagnosis not present

## 2021-03-07 DIAGNOSIS — E876 Hypokalemia: Secondary | ICD-10-CM | POA: Diagnosis not present

## 2021-03-07 DIAGNOSIS — D509 Iron deficiency anemia, unspecified: Secondary | ICD-10-CM | POA: Diagnosis not present

## 2021-03-07 DIAGNOSIS — Z992 Dependence on renal dialysis: Secondary | ICD-10-CM | POA: Diagnosis not present

## 2021-03-07 DIAGNOSIS — N2581 Secondary hyperparathyroidism of renal origin: Secondary | ICD-10-CM | POA: Diagnosis not present

## 2021-03-07 DIAGNOSIS — N186 End stage renal disease: Secondary | ICD-10-CM | POA: Diagnosis not present

## 2021-03-07 DIAGNOSIS — D631 Anemia in chronic kidney disease: Secondary | ICD-10-CM | POA: Diagnosis not present

## 2021-03-08 DIAGNOSIS — I129 Hypertensive chronic kidney disease with stage 1 through stage 4 chronic kidney disease, or unspecified chronic kidney disease: Secondary | ICD-10-CM | POA: Diagnosis not present

## 2021-03-08 DIAGNOSIS — N186 End stage renal disease: Secondary | ICD-10-CM | POA: Diagnosis not present

## 2021-03-08 DIAGNOSIS — Z992 Dependence on renal dialysis: Secondary | ICD-10-CM | POA: Diagnosis not present

## 2021-03-09 DIAGNOSIS — D509 Iron deficiency anemia, unspecified: Secondary | ICD-10-CM | POA: Diagnosis not present

## 2021-03-09 DIAGNOSIS — Z992 Dependence on renal dialysis: Secondary | ICD-10-CM | POA: Diagnosis not present

## 2021-03-09 DIAGNOSIS — E876 Hypokalemia: Secondary | ICD-10-CM | POA: Diagnosis not present

## 2021-03-09 DIAGNOSIS — N2581 Secondary hyperparathyroidism of renal origin: Secondary | ICD-10-CM | POA: Diagnosis not present

## 2021-03-09 DIAGNOSIS — N186 End stage renal disease: Secondary | ICD-10-CM | POA: Diagnosis not present

## 2021-03-09 DIAGNOSIS — Z23 Encounter for immunization: Secondary | ICD-10-CM | POA: Diagnosis not present

## 2021-03-09 DIAGNOSIS — D631 Anemia in chronic kidney disease: Secondary | ICD-10-CM | POA: Diagnosis not present

## 2021-03-11 DIAGNOSIS — E876 Hypokalemia: Secondary | ICD-10-CM | POA: Diagnosis not present

## 2021-03-11 DIAGNOSIS — N186 End stage renal disease: Secondary | ICD-10-CM | POA: Diagnosis not present

## 2021-03-11 DIAGNOSIS — D631 Anemia in chronic kidney disease: Secondary | ICD-10-CM | POA: Diagnosis not present

## 2021-03-11 DIAGNOSIS — N2581 Secondary hyperparathyroidism of renal origin: Secondary | ICD-10-CM | POA: Diagnosis not present

## 2021-03-11 DIAGNOSIS — D509 Iron deficiency anemia, unspecified: Secondary | ICD-10-CM | POA: Diagnosis not present

## 2021-03-11 DIAGNOSIS — Z23 Encounter for immunization: Secondary | ICD-10-CM | POA: Diagnosis not present

## 2021-03-11 DIAGNOSIS — Z992 Dependence on renal dialysis: Secondary | ICD-10-CM | POA: Diagnosis not present

## 2021-03-14 DIAGNOSIS — N186 End stage renal disease: Secondary | ICD-10-CM | POA: Diagnosis not present

## 2021-03-14 DIAGNOSIS — D509 Iron deficiency anemia, unspecified: Secondary | ICD-10-CM | POA: Diagnosis not present

## 2021-03-14 DIAGNOSIS — E876 Hypokalemia: Secondary | ICD-10-CM | POA: Diagnosis not present

## 2021-03-14 DIAGNOSIS — D631 Anemia in chronic kidney disease: Secondary | ICD-10-CM | POA: Diagnosis not present

## 2021-03-14 DIAGNOSIS — Z23 Encounter for immunization: Secondary | ICD-10-CM | POA: Diagnosis not present

## 2021-03-14 DIAGNOSIS — Z992 Dependence on renal dialysis: Secondary | ICD-10-CM | POA: Diagnosis not present

## 2021-03-14 DIAGNOSIS — N2581 Secondary hyperparathyroidism of renal origin: Secondary | ICD-10-CM | POA: Diagnosis not present

## 2021-03-16 DIAGNOSIS — D509 Iron deficiency anemia, unspecified: Secondary | ICD-10-CM | POA: Diagnosis not present

## 2021-03-16 DIAGNOSIS — N186 End stage renal disease: Secondary | ICD-10-CM | POA: Diagnosis not present

## 2021-03-16 DIAGNOSIS — N2581 Secondary hyperparathyroidism of renal origin: Secondary | ICD-10-CM | POA: Diagnosis not present

## 2021-03-16 DIAGNOSIS — E876 Hypokalemia: Secondary | ICD-10-CM | POA: Diagnosis not present

## 2021-03-16 DIAGNOSIS — D631 Anemia in chronic kidney disease: Secondary | ICD-10-CM | POA: Diagnosis not present

## 2021-03-16 DIAGNOSIS — Z992 Dependence on renal dialysis: Secondary | ICD-10-CM | POA: Diagnosis not present

## 2021-03-16 DIAGNOSIS — Z23 Encounter for immunization: Secondary | ICD-10-CM | POA: Diagnosis not present

## 2021-03-18 DIAGNOSIS — D509 Iron deficiency anemia, unspecified: Secondary | ICD-10-CM | POA: Diagnosis not present

## 2021-03-18 DIAGNOSIS — N186 End stage renal disease: Secondary | ICD-10-CM | POA: Diagnosis not present

## 2021-03-18 DIAGNOSIS — Z23 Encounter for immunization: Secondary | ICD-10-CM | POA: Diagnosis not present

## 2021-03-18 DIAGNOSIS — D631 Anemia in chronic kidney disease: Secondary | ICD-10-CM | POA: Diagnosis not present

## 2021-03-18 DIAGNOSIS — E876 Hypokalemia: Secondary | ICD-10-CM | POA: Diagnosis not present

## 2021-03-18 DIAGNOSIS — Z992 Dependence on renal dialysis: Secondary | ICD-10-CM | POA: Diagnosis not present

## 2021-03-18 DIAGNOSIS — N2581 Secondary hyperparathyroidism of renal origin: Secondary | ICD-10-CM | POA: Diagnosis not present

## 2021-03-21 DIAGNOSIS — N2581 Secondary hyperparathyroidism of renal origin: Secondary | ICD-10-CM | POA: Diagnosis not present

## 2021-03-21 DIAGNOSIS — Z992 Dependence on renal dialysis: Secondary | ICD-10-CM | POA: Diagnosis not present

## 2021-03-21 DIAGNOSIS — Z23 Encounter for immunization: Secondary | ICD-10-CM | POA: Diagnosis not present

## 2021-03-21 DIAGNOSIS — E876 Hypokalemia: Secondary | ICD-10-CM | POA: Diagnosis not present

## 2021-03-21 DIAGNOSIS — D509 Iron deficiency anemia, unspecified: Secondary | ICD-10-CM | POA: Diagnosis not present

## 2021-03-21 DIAGNOSIS — D631 Anemia in chronic kidney disease: Secondary | ICD-10-CM | POA: Diagnosis not present

## 2021-03-21 DIAGNOSIS — N186 End stage renal disease: Secondary | ICD-10-CM | POA: Diagnosis not present

## 2021-03-23 DIAGNOSIS — N186 End stage renal disease: Secondary | ICD-10-CM | POA: Diagnosis not present

## 2021-03-23 DIAGNOSIS — Z992 Dependence on renal dialysis: Secondary | ICD-10-CM | POA: Diagnosis not present

## 2021-03-23 DIAGNOSIS — N2581 Secondary hyperparathyroidism of renal origin: Secondary | ICD-10-CM | POA: Diagnosis not present

## 2021-03-23 DIAGNOSIS — Z23 Encounter for immunization: Secondary | ICD-10-CM | POA: Diagnosis not present

## 2021-03-23 DIAGNOSIS — D631 Anemia in chronic kidney disease: Secondary | ICD-10-CM | POA: Diagnosis not present

## 2021-03-23 DIAGNOSIS — E876 Hypokalemia: Secondary | ICD-10-CM | POA: Diagnosis not present

## 2021-03-23 DIAGNOSIS — D509 Iron deficiency anemia, unspecified: Secondary | ICD-10-CM | POA: Diagnosis not present

## 2021-03-25 DIAGNOSIS — D509 Iron deficiency anemia, unspecified: Secondary | ICD-10-CM | POA: Diagnosis not present

## 2021-03-25 DIAGNOSIS — N186 End stage renal disease: Secondary | ICD-10-CM | POA: Diagnosis not present

## 2021-03-25 DIAGNOSIS — Z23 Encounter for immunization: Secondary | ICD-10-CM | POA: Diagnosis not present

## 2021-03-25 DIAGNOSIS — N2581 Secondary hyperparathyroidism of renal origin: Secondary | ICD-10-CM | POA: Diagnosis not present

## 2021-03-25 DIAGNOSIS — D631 Anemia in chronic kidney disease: Secondary | ICD-10-CM | POA: Diagnosis not present

## 2021-03-25 DIAGNOSIS — Z992 Dependence on renal dialysis: Secondary | ICD-10-CM | POA: Diagnosis not present

## 2021-03-25 DIAGNOSIS — E876 Hypokalemia: Secondary | ICD-10-CM | POA: Diagnosis not present

## 2021-03-28 DIAGNOSIS — E876 Hypokalemia: Secondary | ICD-10-CM | POA: Diagnosis not present

## 2021-03-28 DIAGNOSIS — Z23 Encounter for immunization: Secondary | ICD-10-CM | POA: Diagnosis not present

## 2021-03-28 DIAGNOSIS — Z992 Dependence on renal dialysis: Secondary | ICD-10-CM | POA: Diagnosis not present

## 2021-03-28 DIAGNOSIS — D509 Iron deficiency anemia, unspecified: Secondary | ICD-10-CM | POA: Diagnosis not present

## 2021-03-28 DIAGNOSIS — D631 Anemia in chronic kidney disease: Secondary | ICD-10-CM | POA: Diagnosis not present

## 2021-03-28 DIAGNOSIS — N186 End stage renal disease: Secondary | ICD-10-CM | POA: Diagnosis not present

## 2021-03-28 DIAGNOSIS — N2581 Secondary hyperparathyroidism of renal origin: Secondary | ICD-10-CM | POA: Diagnosis not present

## 2021-03-30 DIAGNOSIS — D631 Anemia in chronic kidney disease: Secondary | ICD-10-CM | POA: Diagnosis not present

## 2021-03-30 DIAGNOSIS — N2581 Secondary hyperparathyroidism of renal origin: Secondary | ICD-10-CM | POA: Diagnosis not present

## 2021-03-30 DIAGNOSIS — E876 Hypokalemia: Secondary | ICD-10-CM | POA: Diagnosis not present

## 2021-03-30 DIAGNOSIS — Z23 Encounter for immunization: Secondary | ICD-10-CM | POA: Diagnosis not present

## 2021-03-30 DIAGNOSIS — N186 End stage renal disease: Secondary | ICD-10-CM | POA: Diagnosis not present

## 2021-03-30 DIAGNOSIS — Z992 Dependence on renal dialysis: Secondary | ICD-10-CM | POA: Diagnosis not present

## 2021-03-30 DIAGNOSIS — D509 Iron deficiency anemia, unspecified: Secondary | ICD-10-CM | POA: Diagnosis not present

## 2021-04-01 ENCOUNTER — Other Ambulatory Visit: Payer: Self-pay | Admitting: Cardiovascular Disease

## 2021-04-01 DIAGNOSIS — N2581 Secondary hyperparathyroidism of renal origin: Secondary | ICD-10-CM | POA: Diagnosis not present

## 2021-04-01 DIAGNOSIS — E876 Hypokalemia: Secondary | ICD-10-CM | POA: Diagnosis not present

## 2021-04-01 DIAGNOSIS — D509 Iron deficiency anemia, unspecified: Secondary | ICD-10-CM | POA: Diagnosis not present

## 2021-04-01 DIAGNOSIS — Z23 Encounter for immunization: Secondary | ICD-10-CM | POA: Diagnosis not present

## 2021-04-01 DIAGNOSIS — D631 Anemia in chronic kidney disease: Secondary | ICD-10-CM | POA: Diagnosis not present

## 2021-04-01 DIAGNOSIS — Z992 Dependence on renal dialysis: Secondary | ICD-10-CM | POA: Diagnosis not present

## 2021-04-01 DIAGNOSIS — N186 End stage renal disease: Secondary | ICD-10-CM | POA: Diagnosis not present

## 2021-04-04 DIAGNOSIS — N186 End stage renal disease: Secondary | ICD-10-CM | POA: Diagnosis not present

## 2021-04-04 DIAGNOSIS — D631 Anemia in chronic kidney disease: Secondary | ICD-10-CM | POA: Diagnosis not present

## 2021-04-04 DIAGNOSIS — Z992 Dependence on renal dialysis: Secondary | ICD-10-CM | POA: Diagnosis not present

## 2021-04-04 DIAGNOSIS — Z23 Encounter for immunization: Secondary | ICD-10-CM | POA: Diagnosis not present

## 2021-04-04 DIAGNOSIS — D509 Iron deficiency anemia, unspecified: Secondary | ICD-10-CM | POA: Diagnosis not present

## 2021-04-04 DIAGNOSIS — N2581 Secondary hyperparathyroidism of renal origin: Secondary | ICD-10-CM | POA: Diagnosis not present

## 2021-04-04 DIAGNOSIS — E876 Hypokalemia: Secondary | ICD-10-CM | POA: Diagnosis not present

## 2021-04-04 NOTE — Telephone Encounter (Signed)
Prescription refill request for Eliquis received. ?Indication:Afib ?Last office visit:8/22 ?Scr:4.4 ?Age: 81 ?Weight:73.4  kg ? ?Prescription refilled ? ?

## 2021-04-06 DIAGNOSIS — D509 Iron deficiency anemia, unspecified: Secondary | ICD-10-CM | POA: Diagnosis not present

## 2021-04-06 DIAGNOSIS — E876 Hypokalemia: Secondary | ICD-10-CM | POA: Diagnosis not present

## 2021-04-06 DIAGNOSIS — D631 Anemia in chronic kidney disease: Secondary | ICD-10-CM | POA: Diagnosis not present

## 2021-04-06 DIAGNOSIS — N186 End stage renal disease: Secondary | ICD-10-CM | POA: Diagnosis not present

## 2021-04-06 DIAGNOSIS — Z992 Dependence on renal dialysis: Secondary | ICD-10-CM | POA: Diagnosis not present

## 2021-04-06 DIAGNOSIS — Z23 Encounter for immunization: Secondary | ICD-10-CM | POA: Diagnosis not present

## 2021-04-06 DIAGNOSIS — N2581 Secondary hyperparathyroidism of renal origin: Secondary | ICD-10-CM | POA: Diagnosis not present

## 2021-04-08 DIAGNOSIS — N2581 Secondary hyperparathyroidism of renal origin: Secondary | ICD-10-CM | POA: Diagnosis not present

## 2021-04-08 DIAGNOSIS — I129 Hypertensive chronic kidney disease with stage 1 through stage 4 chronic kidney disease, or unspecified chronic kidney disease: Secondary | ICD-10-CM | POA: Diagnosis not present

## 2021-04-08 DIAGNOSIS — N186 End stage renal disease: Secondary | ICD-10-CM | POA: Diagnosis not present

## 2021-04-08 DIAGNOSIS — Z992 Dependence on renal dialysis: Secondary | ICD-10-CM | POA: Diagnosis not present

## 2021-04-11 DIAGNOSIS — Z992 Dependence on renal dialysis: Secondary | ICD-10-CM | POA: Diagnosis not present

## 2021-04-11 DIAGNOSIS — E876 Hypokalemia: Secondary | ICD-10-CM | POA: Diagnosis not present

## 2021-04-11 DIAGNOSIS — N186 End stage renal disease: Secondary | ICD-10-CM | POA: Diagnosis not present

## 2021-04-11 DIAGNOSIS — D509 Iron deficiency anemia, unspecified: Secondary | ICD-10-CM | POA: Diagnosis not present

## 2021-04-11 DIAGNOSIS — D631 Anemia in chronic kidney disease: Secondary | ICD-10-CM | POA: Diagnosis not present

## 2021-04-11 DIAGNOSIS — N2581 Secondary hyperparathyroidism of renal origin: Secondary | ICD-10-CM | POA: Diagnosis not present

## 2021-04-13 DIAGNOSIS — D509 Iron deficiency anemia, unspecified: Secondary | ICD-10-CM | POA: Diagnosis not present

## 2021-04-13 DIAGNOSIS — N186 End stage renal disease: Secondary | ICD-10-CM | POA: Diagnosis not present

## 2021-04-13 DIAGNOSIS — D631 Anemia in chronic kidney disease: Secondary | ICD-10-CM | POA: Diagnosis not present

## 2021-04-13 DIAGNOSIS — Z992 Dependence on renal dialysis: Secondary | ICD-10-CM | POA: Diagnosis not present

## 2021-04-13 DIAGNOSIS — N2581 Secondary hyperparathyroidism of renal origin: Secondary | ICD-10-CM | POA: Diagnosis not present

## 2021-04-13 DIAGNOSIS — E876 Hypokalemia: Secondary | ICD-10-CM | POA: Diagnosis not present

## 2021-04-15 DIAGNOSIS — N186 End stage renal disease: Secondary | ICD-10-CM | POA: Diagnosis not present

## 2021-04-15 DIAGNOSIS — N2581 Secondary hyperparathyroidism of renal origin: Secondary | ICD-10-CM | POA: Diagnosis not present

## 2021-04-15 DIAGNOSIS — D509 Iron deficiency anemia, unspecified: Secondary | ICD-10-CM | POA: Diagnosis not present

## 2021-04-15 DIAGNOSIS — E876 Hypokalemia: Secondary | ICD-10-CM | POA: Diagnosis not present

## 2021-04-15 DIAGNOSIS — Z992 Dependence on renal dialysis: Secondary | ICD-10-CM | POA: Diagnosis not present

## 2021-04-15 DIAGNOSIS — D631 Anemia in chronic kidney disease: Secondary | ICD-10-CM | POA: Diagnosis not present

## 2021-04-18 DIAGNOSIS — D631 Anemia in chronic kidney disease: Secondary | ICD-10-CM | POA: Diagnosis not present

## 2021-04-18 DIAGNOSIS — Z992 Dependence on renal dialysis: Secondary | ICD-10-CM | POA: Diagnosis not present

## 2021-04-18 DIAGNOSIS — D509 Iron deficiency anemia, unspecified: Secondary | ICD-10-CM | POA: Diagnosis not present

## 2021-04-18 DIAGNOSIS — E876 Hypokalemia: Secondary | ICD-10-CM | POA: Diagnosis not present

## 2021-04-18 DIAGNOSIS — N2581 Secondary hyperparathyroidism of renal origin: Secondary | ICD-10-CM | POA: Diagnosis not present

## 2021-04-18 DIAGNOSIS — N186 End stage renal disease: Secondary | ICD-10-CM | POA: Diagnosis not present

## 2021-04-20 DIAGNOSIS — N2581 Secondary hyperparathyroidism of renal origin: Secondary | ICD-10-CM | POA: Diagnosis not present

## 2021-04-20 DIAGNOSIS — D631 Anemia in chronic kidney disease: Secondary | ICD-10-CM | POA: Diagnosis not present

## 2021-04-20 DIAGNOSIS — D509 Iron deficiency anemia, unspecified: Secondary | ICD-10-CM | POA: Diagnosis not present

## 2021-04-20 DIAGNOSIS — E876 Hypokalemia: Secondary | ICD-10-CM | POA: Diagnosis not present

## 2021-04-20 DIAGNOSIS — Z992 Dependence on renal dialysis: Secondary | ICD-10-CM | POA: Diagnosis not present

## 2021-04-20 DIAGNOSIS — N186 End stage renal disease: Secondary | ICD-10-CM | POA: Diagnosis not present

## 2021-04-21 DIAGNOSIS — I700 Atherosclerosis of aorta: Secondary | ICD-10-CM | POA: Diagnosis not present

## 2021-04-21 DIAGNOSIS — K5730 Diverticulosis of large intestine without perforation or abscess without bleeding: Secondary | ICD-10-CM | POA: Diagnosis not present

## 2021-04-21 DIAGNOSIS — C641 Malignant neoplasm of right kidney, except renal pelvis: Secondary | ICD-10-CM | POA: Diagnosis not present

## 2021-04-21 DIAGNOSIS — I517 Cardiomegaly: Secondary | ICD-10-CM | POA: Diagnosis not present

## 2021-04-21 DIAGNOSIS — N281 Cyst of kidney, acquired: Secondary | ICD-10-CM | POA: Diagnosis not present

## 2021-04-21 DIAGNOSIS — K8020 Calculus of gallbladder without cholecystitis without obstruction: Secondary | ICD-10-CM | POA: Diagnosis not present

## 2021-04-21 DIAGNOSIS — Z85528 Personal history of other malignant neoplasm of kidney: Secondary | ICD-10-CM | POA: Diagnosis not present

## 2021-04-21 DIAGNOSIS — R918 Other nonspecific abnormal finding of lung field: Secondary | ICD-10-CM | POA: Diagnosis not present

## 2021-04-22 DIAGNOSIS — E876 Hypokalemia: Secondary | ICD-10-CM | POA: Diagnosis not present

## 2021-04-22 DIAGNOSIS — N2581 Secondary hyperparathyroidism of renal origin: Secondary | ICD-10-CM | POA: Diagnosis not present

## 2021-04-22 DIAGNOSIS — D631 Anemia in chronic kidney disease: Secondary | ICD-10-CM | POA: Diagnosis not present

## 2021-04-22 DIAGNOSIS — Z992 Dependence on renal dialysis: Secondary | ICD-10-CM | POA: Diagnosis not present

## 2021-04-22 DIAGNOSIS — N186 End stage renal disease: Secondary | ICD-10-CM | POA: Diagnosis not present

## 2021-04-22 DIAGNOSIS — D509 Iron deficiency anemia, unspecified: Secondary | ICD-10-CM | POA: Diagnosis not present

## 2021-04-25 DIAGNOSIS — N2581 Secondary hyperparathyroidism of renal origin: Secondary | ICD-10-CM | POA: Diagnosis not present

## 2021-04-25 DIAGNOSIS — D509 Iron deficiency anemia, unspecified: Secondary | ICD-10-CM | POA: Diagnosis not present

## 2021-04-25 DIAGNOSIS — N186 End stage renal disease: Secondary | ICD-10-CM | POA: Diagnosis not present

## 2021-04-25 DIAGNOSIS — E876 Hypokalemia: Secondary | ICD-10-CM | POA: Diagnosis not present

## 2021-04-25 DIAGNOSIS — Z992 Dependence on renal dialysis: Secondary | ICD-10-CM | POA: Diagnosis not present

## 2021-04-25 DIAGNOSIS — D631 Anemia in chronic kidney disease: Secondary | ICD-10-CM | POA: Diagnosis not present

## 2021-04-27 DIAGNOSIS — D631 Anemia in chronic kidney disease: Secondary | ICD-10-CM | POA: Diagnosis not present

## 2021-04-27 DIAGNOSIS — D509 Iron deficiency anemia, unspecified: Secondary | ICD-10-CM | POA: Diagnosis not present

## 2021-04-27 DIAGNOSIS — E876 Hypokalemia: Secondary | ICD-10-CM | POA: Diagnosis not present

## 2021-04-27 DIAGNOSIS — Z992 Dependence on renal dialysis: Secondary | ICD-10-CM | POA: Diagnosis not present

## 2021-04-27 DIAGNOSIS — N2581 Secondary hyperparathyroidism of renal origin: Secondary | ICD-10-CM | POA: Diagnosis not present

## 2021-04-27 DIAGNOSIS — N186 End stage renal disease: Secondary | ICD-10-CM | POA: Diagnosis not present

## 2021-04-29 ENCOUNTER — Other Ambulatory Visit: Payer: Self-pay | Admitting: Cardiovascular Disease

## 2021-04-29 DIAGNOSIS — N186 End stage renal disease: Secondary | ICD-10-CM | POA: Diagnosis not present

## 2021-04-29 DIAGNOSIS — D631 Anemia in chronic kidney disease: Secondary | ICD-10-CM | POA: Diagnosis not present

## 2021-04-29 DIAGNOSIS — D509 Iron deficiency anemia, unspecified: Secondary | ICD-10-CM | POA: Diagnosis not present

## 2021-04-29 DIAGNOSIS — N2581 Secondary hyperparathyroidism of renal origin: Secondary | ICD-10-CM | POA: Diagnosis not present

## 2021-04-29 DIAGNOSIS — E876 Hypokalemia: Secondary | ICD-10-CM | POA: Diagnosis not present

## 2021-04-29 DIAGNOSIS — Z992 Dependence on renal dialysis: Secondary | ICD-10-CM | POA: Diagnosis not present

## 2021-05-02 DIAGNOSIS — D631 Anemia in chronic kidney disease: Secondary | ICD-10-CM | POA: Diagnosis not present

## 2021-05-02 DIAGNOSIS — N2581 Secondary hyperparathyroidism of renal origin: Secondary | ICD-10-CM | POA: Diagnosis not present

## 2021-05-02 DIAGNOSIS — N186 End stage renal disease: Secondary | ICD-10-CM | POA: Diagnosis not present

## 2021-05-02 DIAGNOSIS — Z992 Dependence on renal dialysis: Secondary | ICD-10-CM | POA: Diagnosis not present

## 2021-05-02 DIAGNOSIS — D509 Iron deficiency anemia, unspecified: Secondary | ICD-10-CM | POA: Diagnosis not present

## 2021-05-02 DIAGNOSIS — E876 Hypokalemia: Secondary | ICD-10-CM | POA: Diagnosis not present

## 2021-05-04 DIAGNOSIS — Z992 Dependence on renal dialysis: Secondary | ICD-10-CM | POA: Diagnosis not present

## 2021-05-04 DIAGNOSIS — D631 Anemia in chronic kidney disease: Secondary | ICD-10-CM | POA: Diagnosis not present

## 2021-05-04 DIAGNOSIS — N2581 Secondary hyperparathyroidism of renal origin: Secondary | ICD-10-CM | POA: Diagnosis not present

## 2021-05-04 DIAGNOSIS — N186 End stage renal disease: Secondary | ICD-10-CM | POA: Diagnosis not present

## 2021-05-04 DIAGNOSIS — E876 Hypokalemia: Secondary | ICD-10-CM | POA: Diagnosis not present

## 2021-05-04 DIAGNOSIS — D509 Iron deficiency anemia, unspecified: Secondary | ICD-10-CM | POA: Diagnosis not present

## 2021-05-05 ENCOUNTER — Ambulatory Visit (INDEPENDENT_AMBULATORY_CARE_PROVIDER_SITE_OTHER): Payer: Medicare Other

## 2021-05-05 DIAGNOSIS — I441 Atrioventricular block, second degree: Secondary | ICD-10-CM | POA: Diagnosis not present

## 2021-05-05 LAB — CUP PACEART REMOTE DEVICE CHECK
Battery Remaining Longevity: 45 mo
Battery Voltage: 2.88 V
Brady Statistic AP VP Percent: 61.06 %
Brady Statistic AP VS Percent: 0.04 %
Brady Statistic AS VP Percent: 38.29 %
Brady Statistic AS VS Percent: 0.59 %
Brady Statistic RA Percent Paced: 56.21 %
Brady Statistic RV Percent Paced: 99.34 %
Date Time Interrogation Session: 20230427054945
Implantable Lead Implant Date: 20181217
Implantable Lead Implant Date: 20181217
Implantable Lead Location: 753859
Implantable Lead Location: 753859
Implantable Lead Model: 3830
Implantable Lead Model: 5076
Implantable Pulse Generator Implant Date: 20181217
Lead Channel Impedance Value: 342 Ohm
Lead Channel Impedance Value: 342 Ohm
Lead Channel Impedance Value: 418 Ohm
Lead Channel Impedance Value: 437 Ohm
Lead Channel Pacing Threshold Amplitude: 0.5 V
Lead Channel Pacing Threshold Amplitude: 0.75 V
Lead Channel Pacing Threshold Pulse Width: 0.4 ms
Lead Channel Pacing Threshold Pulse Width: 0.4 ms
Lead Channel Sensing Intrinsic Amplitude: 0.5 mV
Lead Channel Sensing Intrinsic Amplitude: 0.5 mV
Lead Channel Sensing Intrinsic Amplitude: 14.875 mV
Lead Channel Sensing Intrinsic Amplitude: 14.875 mV
Lead Channel Setting Pacing Amplitude: 2 V
Lead Channel Setting Pacing Amplitude: 2.5 V
Lead Channel Setting Pacing Pulse Width: 1 ms
Lead Channel Setting Sensing Sensitivity: 1.2 mV

## 2021-05-06 DIAGNOSIS — N2581 Secondary hyperparathyroidism of renal origin: Secondary | ICD-10-CM | POA: Diagnosis not present

## 2021-05-06 DIAGNOSIS — N186 End stage renal disease: Secondary | ICD-10-CM | POA: Diagnosis not present

## 2021-05-06 DIAGNOSIS — D631 Anemia in chronic kidney disease: Secondary | ICD-10-CM | POA: Diagnosis not present

## 2021-05-06 DIAGNOSIS — E876 Hypokalemia: Secondary | ICD-10-CM | POA: Diagnosis not present

## 2021-05-06 DIAGNOSIS — D509 Iron deficiency anemia, unspecified: Secondary | ICD-10-CM | POA: Diagnosis not present

## 2021-05-06 DIAGNOSIS — Z992 Dependence on renal dialysis: Secondary | ICD-10-CM | POA: Diagnosis not present

## 2021-05-08 DIAGNOSIS — N186 End stage renal disease: Secondary | ICD-10-CM | POA: Diagnosis not present

## 2021-05-08 DIAGNOSIS — I129 Hypertensive chronic kidney disease with stage 1 through stage 4 chronic kidney disease, or unspecified chronic kidney disease: Secondary | ICD-10-CM | POA: Diagnosis not present

## 2021-05-08 DIAGNOSIS — Z992 Dependence on renal dialysis: Secondary | ICD-10-CM | POA: Diagnosis not present

## 2021-05-09 DIAGNOSIS — D631 Anemia in chronic kidney disease: Secondary | ICD-10-CM | POA: Diagnosis not present

## 2021-05-09 DIAGNOSIS — D509 Iron deficiency anemia, unspecified: Secondary | ICD-10-CM | POA: Diagnosis not present

## 2021-05-09 DIAGNOSIS — N186 End stage renal disease: Secondary | ICD-10-CM | POA: Diagnosis not present

## 2021-05-09 DIAGNOSIS — Z992 Dependence on renal dialysis: Secondary | ICD-10-CM | POA: Diagnosis not present

## 2021-05-09 DIAGNOSIS — N2581 Secondary hyperparathyroidism of renal origin: Secondary | ICD-10-CM | POA: Diagnosis not present

## 2021-05-10 DIAGNOSIS — C641 Malignant neoplasm of right kidney, except renal pelvis: Secondary | ICD-10-CM | POA: Diagnosis not present

## 2021-05-11 DIAGNOSIS — D509 Iron deficiency anemia, unspecified: Secondary | ICD-10-CM | POA: Diagnosis not present

## 2021-05-11 DIAGNOSIS — D631 Anemia in chronic kidney disease: Secondary | ICD-10-CM | POA: Diagnosis not present

## 2021-05-11 DIAGNOSIS — N2581 Secondary hyperparathyroidism of renal origin: Secondary | ICD-10-CM | POA: Diagnosis not present

## 2021-05-11 DIAGNOSIS — N186 End stage renal disease: Secondary | ICD-10-CM | POA: Diagnosis not present

## 2021-05-11 DIAGNOSIS — Z992 Dependence on renal dialysis: Secondary | ICD-10-CM | POA: Diagnosis not present

## 2021-05-13 DIAGNOSIS — Z992 Dependence on renal dialysis: Secondary | ICD-10-CM | POA: Diagnosis not present

## 2021-05-13 DIAGNOSIS — D509 Iron deficiency anemia, unspecified: Secondary | ICD-10-CM | POA: Diagnosis not present

## 2021-05-13 DIAGNOSIS — D631 Anemia in chronic kidney disease: Secondary | ICD-10-CM | POA: Diagnosis not present

## 2021-05-13 DIAGNOSIS — N2581 Secondary hyperparathyroidism of renal origin: Secondary | ICD-10-CM | POA: Diagnosis not present

## 2021-05-13 DIAGNOSIS — N186 End stage renal disease: Secondary | ICD-10-CM | POA: Diagnosis not present

## 2021-05-16 DIAGNOSIS — N186 End stage renal disease: Secondary | ICD-10-CM | POA: Diagnosis not present

## 2021-05-16 DIAGNOSIS — D631 Anemia in chronic kidney disease: Secondary | ICD-10-CM | POA: Diagnosis not present

## 2021-05-16 DIAGNOSIS — Z992 Dependence on renal dialysis: Secondary | ICD-10-CM | POA: Diagnosis not present

## 2021-05-16 DIAGNOSIS — D509 Iron deficiency anemia, unspecified: Secondary | ICD-10-CM | POA: Diagnosis not present

## 2021-05-16 DIAGNOSIS — N2581 Secondary hyperparathyroidism of renal origin: Secondary | ICD-10-CM | POA: Diagnosis not present

## 2021-05-18 DIAGNOSIS — N2581 Secondary hyperparathyroidism of renal origin: Secondary | ICD-10-CM | POA: Diagnosis not present

## 2021-05-18 DIAGNOSIS — D509 Iron deficiency anemia, unspecified: Secondary | ICD-10-CM | POA: Diagnosis not present

## 2021-05-18 DIAGNOSIS — N186 End stage renal disease: Secondary | ICD-10-CM | POA: Diagnosis not present

## 2021-05-18 DIAGNOSIS — D631 Anemia in chronic kidney disease: Secondary | ICD-10-CM | POA: Diagnosis not present

## 2021-05-18 DIAGNOSIS — Z992 Dependence on renal dialysis: Secondary | ICD-10-CM | POA: Diagnosis not present

## 2021-05-20 DIAGNOSIS — Z992 Dependence on renal dialysis: Secondary | ICD-10-CM | POA: Diagnosis not present

## 2021-05-20 DIAGNOSIS — N186 End stage renal disease: Secondary | ICD-10-CM | POA: Diagnosis not present

## 2021-05-20 DIAGNOSIS — D509 Iron deficiency anemia, unspecified: Secondary | ICD-10-CM | POA: Diagnosis not present

## 2021-05-20 DIAGNOSIS — N2581 Secondary hyperparathyroidism of renal origin: Secondary | ICD-10-CM | POA: Diagnosis not present

## 2021-05-20 DIAGNOSIS — D631 Anemia in chronic kidney disease: Secondary | ICD-10-CM | POA: Diagnosis not present

## 2021-05-20 NOTE — Progress Notes (Signed)
Remote pacemaker transmission.   

## 2021-05-23 DIAGNOSIS — Z992 Dependence on renal dialysis: Secondary | ICD-10-CM | POA: Diagnosis not present

## 2021-05-23 DIAGNOSIS — D631 Anemia in chronic kidney disease: Secondary | ICD-10-CM | POA: Diagnosis not present

## 2021-05-23 DIAGNOSIS — D509 Iron deficiency anemia, unspecified: Secondary | ICD-10-CM | POA: Diagnosis not present

## 2021-05-23 DIAGNOSIS — N186 End stage renal disease: Secondary | ICD-10-CM | POA: Diagnosis not present

## 2021-05-23 DIAGNOSIS — N2581 Secondary hyperparathyroidism of renal origin: Secondary | ICD-10-CM | POA: Diagnosis not present

## 2021-05-25 DIAGNOSIS — N2581 Secondary hyperparathyroidism of renal origin: Secondary | ICD-10-CM | POA: Diagnosis not present

## 2021-05-25 DIAGNOSIS — D509 Iron deficiency anemia, unspecified: Secondary | ICD-10-CM | POA: Diagnosis not present

## 2021-05-25 DIAGNOSIS — D631 Anemia in chronic kidney disease: Secondary | ICD-10-CM | POA: Diagnosis not present

## 2021-05-25 DIAGNOSIS — Z992 Dependence on renal dialysis: Secondary | ICD-10-CM | POA: Diagnosis not present

## 2021-05-25 DIAGNOSIS — N186 End stage renal disease: Secondary | ICD-10-CM | POA: Diagnosis not present

## 2021-05-27 DIAGNOSIS — N2581 Secondary hyperparathyroidism of renal origin: Secondary | ICD-10-CM | POA: Diagnosis not present

## 2021-05-27 DIAGNOSIS — D509 Iron deficiency anemia, unspecified: Secondary | ICD-10-CM | POA: Diagnosis not present

## 2021-05-27 DIAGNOSIS — Z992 Dependence on renal dialysis: Secondary | ICD-10-CM | POA: Diagnosis not present

## 2021-05-27 DIAGNOSIS — N186 End stage renal disease: Secondary | ICD-10-CM | POA: Diagnosis not present

## 2021-05-27 DIAGNOSIS — D631 Anemia in chronic kidney disease: Secondary | ICD-10-CM | POA: Diagnosis not present

## 2021-05-30 DIAGNOSIS — D631 Anemia in chronic kidney disease: Secondary | ICD-10-CM | POA: Diagnosis not present

## 2021-05-30 DIAGNOSIS — N186 End stage renal disease: Secondary | ICD-10-CM | POA: Diagnosis not present

## 2021-05-30 DIAGNOSIS — N2581 Secondary hyperparathyroidism of renal origin: Secondary | ICD-10-CM | POA: Diagnosis not present

## 2021-05-30 DIAGNOSIS — Z992 Dependence on renal dialysis: Secondary | ICD-10-CM | POA: Diagnosis not present

## 2021-05-30 DIAGNOSIS — D509 Iron deficiency anemia, unspecified: Secondary | ICD-10-CM | POA: Diagnosis not present

## 2021-06-01 DIAGNOSIS — D631 Anemia in chronic kidney disease: Secondary | ICD-10-CM | POA: Diagnosis not present

## 2021-06-01 DIAGNOSIS — D509 Iron deficiency anemia, unspecified: Secondary | ICD-10-CM | POA: Diagnosis not present

## 2021-06-01 DIAGNOSIS — N186 End stage renal disease: Secondary | ICD-10-CM | POA: Diagnosis not present

## 2021-06-01 DIAGNOSIS — N2581 Secondary hyperparathyroidism of renal origin: Secondary | ICD-10-CM | POA: Diagnosis not present

## 2021-06-01 DIAGNOSIS — Z992 Dependence on renal dialysis: Secondary | ICD-10-CM | POA: Diagnosis not present

## 2021-06-03 DIAGNOSIS — D509 Iron deficiency anemia, unspecified: Secondary | ICD-10-CM | POA: Diagnosis not present

## 2021-06-03 DIAGNOSIS — D631 Anemia in chronic kidney disease: Secondary | ICD-10-CM | POA: Diagnosis not present

## 2021-06-03 DIAGNOSIS — N186 End stage renal disease: Secondary | ICD-10-CM | POA: Diagnosis not present

## 2021-06-03 DIAGNOSIS — N2581 Secondary hyperparathyroidism of renal origin: Secondary | ICD-10-CM | POA: Diagnosis not present

## 2021-06-03 DIAGNOSIS — Z992 Dependence on renal dialysis: Secondary | ICD-10-CM | POA: Diagnosis not present

## 2021-06-06 DIAGNOSIS — N2581 Secondary hyperparathyroidism of renal origin: Secondary | ICD-10-CM | POA: Diagnosis not present

## 2021-06-06 DIAGNOSIS — D631 Anemia in chronic kidney disease: Secondary | ICD-10-CM | POA: Diagnosis not present

## 2021-06-06 DIAGNOSIS — D509 Iron deficiency anemia, unspecified: Secondary | ICD-10-CM | POA: Diagnosis not present

## 2021-06-06 DIAGNOSIS — Z992 Dependence on renal dialysis: Secondary | ICD-10-CM | POA: Diagnosis not present

## 2021-06-06 DIAGNOSIS — N186 End stage renal disease: Secondary | ICD-10-CM | POA: Diagnosis not present

## 2021-06-08 DIAGNOSIS — D509 Iron deficiency anemia, unspecified: Secondary | ICD-10-CM | POA: Diagnosis not present

## 2021-06-08 DIAGNOSIS — D631 Anemia in chronic kidney disease: Secondary | ICD-10-CM | POA: Diagnosis not present

## 2021-06-08 DIAGNOSIS — Z992 Dependence on renal dialysis: Secondary | ICD-10-CM | POA: Diagnosis not present

## 2021-06-08 DIAGNOSIS — N186 End stage renal disease: Secondary | ICD-10-CM | POA: Diagnosis not present

## 2021-06-08 DIAGNOSIS — I129 Hypertensive chronic kidney disease with stage 1 through stage 4 chronic kidney disease, or unspecified chronic kidney disease: Secondary | ICD-10-CM | POA: Diagnosis not present

## 2021-06-08 DIAGNOSIS — N2581 Secondary hyperparathyroidism of renal origin: Secondary | ICD-10-CM | POA: Diagnosis not present

## 2021-06-10 DIAGNOSIS — E876 Hypokalemia: Secondary | ICD-10-CM | POA: Diagnosis not present

## 2021-06-10 DIAGNOSIS — D509 Iron deficiency anemia, unspecified: Secondary | ICD-10-CM | POA: Diagnosis not present

## 2021-06-10 DIAGNOSIS — N186 End stage renal disease: Secondary | ICD-10-CM | POA: Diagnosis not present

## 2021-06-10 DIAGNOSIS — N2581 Secondary hyperparathyroidism of renal origin: Secondary | ICD-10-CM | POA: Diagnosis not present

## 2021-06-10 DIAGNOSIS — D631 Anemia in chronic kidney disease: Secondary | ICD-10-CM | POA: Diagnosis not present

## 2021-06-10 DIAGNOSIS — Z992 Dependence on renal dialysis: Secondary | ICD-10-CM | POA: Diagnosis not present

## 2021-06-13 DIAGNOSIS — D509 Iron deficiency anemia, unspecified: Secondary | ICD-10-CM | POA: Diagnosis not present

## 2021-06-13 DIAGNOSIS — Z992 Dependence on renal dialysis: Secondary | ICD-10-CM | POA: Diagnosis not present

## 2021-06-13 DIAGNOSIS — D631 Anemia in chronic kidney disease: Secondary | ICD-10-CM | POA: Diagnosis not present

## 2021-06-13 DIAGNOSIS — E876 Hypokalemia: Secondary | ICD-10-CM | POA: Diagnosis not present

## 2021-06-13 DIAGNOSIS — N2581 Secondary hyperparathyroidism of renal origin: Secondary | ICD-10-CM | POA: Diagnosis not present

## 2021-06-13 DIAGNOSIS — N186 End stage renal disease: Secondary | ICD-10-CM | POA: Diagnosis not present

## 2021-06-15 DIAGNOSIS — Z992 Dependence on renal dialysis: Secondary | ICD-10-CM | POA: Diagnosis not present

## 2021-06-15 DIAGNOSIS — D631 Anemia in chronic kidney disease: Secondary | ICD-10-CM | POA: Diagnosis not present

## 2021-06-15 DIAGNOSIS — E876 Hypokalemia: Secondary | ICD-10-CM | POA: Diagnosis not present

## 2021-06-15 DIAGNOSIS — N2581 Secondary hyperparathyroidism of renal origin: Secondary | ICD-10-CM | POA: Diagnosis not present

## 2021-06-15 DIAGNOSIS — N186 End stage renal disease: Secondary | ICD-10-CM | POA: Diagnosis not present

## 2021-06-15 DIAGNOSIS — D509 Iron deficiency anemia, unspecified: Secondary | ICD-10-CM | POA: Diagnosis not present

## 2021-06-17 DIAGNOSIS — D631 Anemia in chronic kidney disease: Secondary | ICD-10-CM | POA: Diagnosis not present

## 2021-06-17 DIAGNOSIS — Z992 Dependence on renal dialysis: Secondary | ICD-10-CM | POA: Diagnosis not present

## 2021-06-17 DIAGNOSIS — E876 Hypokalemia: Secondary | ICD-10-CM | POA: Diagnosis not present

## 2021-06-17 DIAGNOSIS — N186 End stage renal disease: Secondary | ICD-10-CM | POA: Diagnosis not present

## 2021-06-17 DIAGNOSIS — D509 Iron deficiency anemia, unspecified: Secondary | ICD-10-CM | POA: Diagnosis not present

## 2021-06-17 DIAGNOSIS — N2581 Secondary hyperparathyroidism of renal origin: Secondary | ICD-10-CM | POA: Diagnosis not present

## 2021-06-20 DIAGNOSIS — Z992 Dependence on renal dialysis: Secondary | ICD-10-CM | POA: Diagnosis not present

## 2021-06-20 DIAGNOSIS — D631 Anemia in chronic kidney disease: Secondary | ICD-10-CM | POA: Diagnosis not present

## 2021-06-20 DIAGNOSIS — E876 Hypokalemia: Secondary | ICD-10-CM | POA: Diagnosis not present

## 2021-06-20 DIAGNOSIS — N186 End stage renal disease: Secondary | ICD-10-CM | POA: Diagnosis not present

## 2021-06-20 DIAGNOSIS — D509 Iron deficiency anemia, unspecified: Secondary | ICD-10-CM | POA: Diagnosis not present

## 2021-06-20 DIAGNOSIS — N2581 Secondary hyperparathyroidism of renal origin: Secondary | ICD-10-CM | POA: Diagnosis not present

## 2021-06-22 DIAGNOSIS — D509 Iron deficiency anemia, unspecified: Secondary | ICD-10-CM | POA: Diagnosis not present

## 2021-06-22 DIAGNOSIS — N2581 Secondary hyperparathyroidism of renal origin: Secondary | ICD-10-CM | POA: Diagnosis not present

## 2021-06-22 DIAGNOSIS — D631 Anemia in chronic kidney disease: Secondary | ICD-10-CM | POA: Diagnosis not present

## 2021-06-22 DIAGNOSIS — N186 End stage renal disease: Secondary | ICD-10-CM | POA: Diagnosis not present

## 2021-06-22 DIAGNOSIS — Z992 Dependence on renal dialysis: Secondary | ICD-10-CM | POA: Diagnosis not present

## 2021-06-22 DIAGNOSIS — E876 Hypokalemia: Secondary | ICD-10-CM | POA: Diagnosis not present

## 2021-06-24 DIAGNOSIS — D509 Iron deficiency anemia, unspecified: Secondary | ICD-10-CM | POA: Diagnosis not present

## 2021-06-24 DIAGNOSIS — N2581 Secondary hyperparathyroidism of renal origin: Secondary | ICD-10-CM | POA: Diagnosis not present

## 2021-06-24 DIAGNOSIS — D631 Anemia in chronic kidney disease: Secondary | ICD-10-CM | POA: Diagnosis not present

## 2021-06-24 DIAGNOSIS — E876 Hypokalemia: Secondary | ICD-10-CM | POA: Diagnosis not present

## 2021-06-24 DIAGNOSIS — Z992 Dependence on renal dialysis: Secondary | ICD-10-CM | POA: Diagnosis not present

## 2021-06-24 DIAGNOSIS — N186 End stage renal disease: Secondary | ICD-10-CM | POA: Diagnosis not present

## 2021-06-27 DIAGNOSIS — N2581 Secondary hyperparathyroidism of renal origin: Secondary | ICD-10-CM | POA: Diagnosis not present

## 2021-06-27 DIAGNOSIS — Z992 Dependence on renal dialysis: Secondary | ICD-10-CM | POA: Diagnosis not present

## 2021-06-27 DIAGNOSIS — N186 End stage renal disease: Secondary | ICD-10-CM | POA: Diagnosis not present

## 2021-06-27 DIAGNOSIS — D509 Iron deficiency anemia, unspecified: Secondary | ICD-10-CM | POA: Diagnosis not present

## 2021-06-27 DIAGNOSIS — E876 Hypokalemia: Secondary | ICD-10-CM | POA: Diagnosis not present

## 2021-06-27 DIAGNOSIS — D631 Anemia in chronic kidney disease: Secondary | ICD-10-CM | POA: Diagnosis not present

## 2021-06-29 DIAGNOSIS — Z992 Dependence on renal dialysis: Secondary | ICD-10-CM | POA: Diagnosis not present

## 2021-06-29 DIAGNOSIS — D509 Iron deficiency anemia, unspecified: Secondary | ICD-10-CM | POA: Diagnosis not present

## 2021-06-29 DIAGNOSIS — D631 Anemia in chronic kidney disease: Secondary | ICD-10-CM | POA: Diagnosis not present

## 2021-06-29 DIAGNOSIS — N186 End stage renal disease: Secondary | ICD-10-CM | POA: Diagnosis not present

## 2021-06-29 DIAGNOSIS — N2581 Secondary hyperparathyroidism of renal origin: Secondary | ICD-10-CM | POA: Diagnosis not present

## 2021-06-29 DIAGNOSIS — E876 Hypokalemia: Secondary | ICD-10-CM | POA: Diagnosis not present

## 2021-06-30 ENCOUNTER — Encounter: Payer: Self-pay | Admitting: Cardiovascular Disease

## 2021-06-30 ENCOUNTER — Ambulatory Visit (INDEPENDENT_AMBULATORY_CARE_PROVIDER_SITE_OTHER): Payer: Medicare Other | Admitting: Cardiovascular Disease

## 2021-06-30 VITALS — BP 140/52 | HR 89 | Ht 66.0 in | Wt 170.0 lb

## 2021-06-30 DIAGNOSIS — Z950 Presence of cardiac pacemaker: Secondary | ICD-10-CM | POA: Diagnosis not present

## 2021-06-30 DIAGNOSIS — N186 End stage renal disease: Secondary | ICD-10-CM

## 2021-06-30 DIAGNOSIS — G4733 Obstructive sleep apnea (adult) (pediatric): Secondary | ICD-10-CM

## 2021-06-30 DIAGNOSIS — I2510 Atherosclerotic heart disease of native coronary artery without angina pectoris: Secondary | ICD-10-CM | POA: Diagnosis not present

## 2021-06-30 DIAGNOSIS — I361 Nonrheumatic tricuspid (valve) insufficiency: Secondary | ICD-10-CM

## 2021-06-30 DIAGNOSIS — Z9989 Dependence on other enabling machines and devices: Secondary | ICD-10-CM

## 2021-06-30 DIAGNOSIS — Z7901 Long term (current) use of anticoagulants: Secondary | ICD-10-CM | POA: Diagnosis not present

## 2021-06-30 DIAGNOSIS — I513 Intracardiac thrombosis, not elsewhere classified: Secondary | ICD-10-CM

## 2021-06-30 DIAGNOSIS — Z951 Presence of aortocoronary bypass graft: Secondary | ICD-10-CM | POA: Diagnosis not present

## 2021-06-30 DIAGNOSIS — I340 Nonrheumatic mitral (valve) insufficiency: Secondary | ICD-10-CM | POA: Diagnosis not present

## 2021-06-30 NOTE — Progress Notes (Unsigned)
Patient ID: GLENNON Price, male   DOB: 04-03-1940, 81 y.o.   MRN: [id number]     Primary MD:  Dr. [name]  HPI: Paul Price is a 81 y.o. male who presents for a 10 month cardiology followup evaluation.   Mr. Bury has established CAD and in 1992 underwent CABG revascularization surgery LIMA to the LAD and diagonal. In 2009 he was found to have an atretic LIMA graft to the LAD and a patent vein graft supplying the diagonal vessel. He had 70% narrowing in the LAD beyond the diagonal was treated medically. In April 2012 due to progressive chest pain and scintigraphic evidence for ischemia in the mid distal LAD territory repeat catheterization was performed and he underwent successful stenting to his LAD beyond the diagonal with a 3.0x20 mm Promus DES stent postdilated 3.25 mm. Subsequent, he has remained active and has done well. His last stress test in 2013 showed an apical defect without ischemia.  Additional problems include obstructive sleep apnea on CPAP therapy, moderate obesity, hypertension, history of DVT, and hyperlipidemia.  He sees Dr. Shelia Media for primary care.  On November 19, 2012 he saw Kerin Ransom.PAC for suspected atrial fibrillation and CHF after having been seen by his primary physician and noted some increasing shortness of breath with activity. A CardioNet monitor  demonstrated  a 4.3 second pause which led to a reduction in his  beta blocker dose  from Lopressor 75 mg twice a day to 25 twice a day. He saw Tarri Fuller on 12/02/2012 at which time he was in sinus rhythm but also was found to have probably 9 beat run of nonsustained VT on monitor which is he was maintained on low-dose beta blocker therapy. Subsequent cardiac monitoring did not show any further episodes of significant bradycardia but he did have one additional 9 beat run of nonsustained PSVT at a rate of 109 beats per minute on 12/17/12. . Laboratory done by Dr. Shelia Media on 11/27/2012 showed a potassium of 4.2. His  creatinine was 1.82. BUN 33.  An echo Doppler study done 12/09/2012 showed an estimated ejection fraction at 50-55%. Although no diagnostic regional wall motion abnormalities were definitively identified, this possibility was not completely excluded. There was grade 2 diastolic dysfunction. There is mild left atrial dilatation and mild mitral regurgitation.  A renal ultrasound has  demonstrate bilateral small renal cysts.    In 2015 he was found to have a DVT in his left leg and was started on eliquis by Dr. Shelia Media.  He was hospitalized on November 23 through 12/09/2013 and presented with fevers, and sudden onset of severe back pain.  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  Blood cultures were negative.   After 1 week in the hospital, he spent 3 weeks at rehabilitation.  He was treated with 4 weeks of antibiotic therapy and after his hospitalization.  He was followed by Dr. Michel Bickers from infectious disease.  He last saw him in March 2016 and att that time he was felt to be completely cleared cured.    He has renal insufficiency and has been followed by Dr. Gillermina Phy since he had developed acute on chronic kidney injury.  His serum creatinine peaked at 2.8.  Ultimately, this has improved.    When I saw him in follow-up, he continued to note lower extremity edema. He was no longer anticoagulation but has been on Plavix and aspirin.  I scheduled him for follow-up lower extremity venous ultrasound which  showed resolution of his left distal femoral vein and popliteal vein thrombus . His blood pressure has been treated with Toprol-XL 50 mg losartan 100 mg furosemide 40 mg and amlodipine 10 mg.  He is on lipid lowering therapy with atorvastatin.  He takes allopurinol for gout.  There is history of GERD treated with ranitidine.    He admits to using his CPAP with 100% compliance.  He states he has a new machine.  However, recently he has been noticing frequent awakenings.  He denies  awareness of breakthrough snoring.    When I saw him in April 2018, he noticed a change in symptomatology with more progressive exertional dyspnea.  He denied any exertional chest pressure.  However, he has noticed some nonexertional constant chest wall ache.  His shortness of breath was occurring with less activity.  He is unaware of palpitations.  He denies presyncope or syncope.  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   An echo Doppler study on 04/28/2016 showed an EF of 50-55%.  There was grade 2 diastolic dysfunction.  Septal motion.  She showed paradoxical motion.  The left atrium was severely dilated.  There was mild MR.  The RV was mildly dilated, as was the right atrium.  There was mild palmar hypertension with PA pressure 33.  He underwent a nuclear perfusion study to further evaluate his symptoms.  This remained low risk.  Although calculated at 47% EF, visually it appeared to be 55%.  There was a small nonreversible defect in the mid anterior and apical location without ischemia.  He also underwent lower extremity arterial Doppler studies which showed normal ABIs bilaterally and there was no evidence for segmental lower extremity arterial disease. Creatinine was 1.76 on 04/14/2016; Hemoglobin A1c was increased at 6.7.  Lipid studies revealed a TC 128, triglyceride 152, LDL 66, and he has a low HDL level of 32.    He was hospitalized on July 31 through 08/10/2016 and was found to have atrial fibrillation with rapid ventricular response as well as acute renal failure.  His creatinine had risen to 2.9 and improved to 1.9.  With gentle hydration.  His losartan and spironolactone were held.  He was started on Xarelto at discharge.  On August 29 through 09/09/2016.  He was rehospitalized with GI bleed and acute blood loss anemia.  He underwent endoscopy and colonoscopy and GI recommended continuation of  anticoagulation with concomitant Protonix therapy.  He tells me he received 3 units of blood.  During his initial hospitalization on August 1.  An echo Doppler study showed an EF of 45-50% with diffuse hypocontractility.  There was aortic sclerosis without stenosis, mitral annular calcification, and mild LA dilation.  He continues to be on CPAP.   He was hospitalized in December 2018 for dyspnea on exertion accompanied with lightheadedness.  He was noted to have Mobitz type I heart block during his hospitalization and beta-blocker was discontinued due to suspicion of sick sinus syndrome.  He was ultimately evaluated by Dr. Caryl Comes and underwent pacemaker implantation on December 18.  Subsequently, he has not had any recurrent syncope or presyncope.  A pacemaker device check in March 2019 showed normal device function.  I last saw him in June 2019.  Since his pacemaker insertion he denied any recurrent episodes of dizziness.  His weakness has resolved and shortness of breath is improved.  He  continues to use CPAP with 100% compliance.  He is unaware of any atrial fibrillation.  He admits to occasional leg swelling.  He denies any chest pain.    He saw Dr. Adam Phenix on December 12, 2018 in follow-up of his pacemaker.  Apparently, for some time prior to that evaluation his amlodipine which had been 7.5 mg was discontinued by Dr. Shelia Media due to his concern according to the patient that his blood pressure was getting low.  When evaluated by Dr. Caryl Comes in December, blood pressure was elevated at 150/78.  He was advised to reinstitute amlodipine at 2.5 mg.  However, the patient has subsequently increase this to 5 mg due to continued blood pressure elevations at home with typical blood pressures greater than 062 systolically.  He has continued to be on anticoagulation therapy with Xarelto with his history of PAF and is on a reduced dose with his renal insufficiency.  He is followed by Dr. Azzie Roup for stage IV chronic  kidney disease with creatinine 2.39 in November 2020.  He denies any chest pain PND orthopnea.  I saw him in August 2021 and since his prior evaluation in January 2021, he continued to do well.  Apparently his amlodipine dose was reduced from 5 down to 2.5 mg since his blood pressure has become somewhat low.  He has undergone supplemental iron injections by Dr. Irene Limbo for his anemia.  He continues to be without anginal symptomatology on amlodipine 2.5 mg, metoprolol succinate 25 mg daily and he continues to be on furosemide 20 mg.  He states his blood pressure at home typically is in the 120s to low 130s.  He recently underwent laboratory on August 12, 2019 which revealed hemoglobin of 8 2 hematocrit 27.0.  He had macrocytic indices.  B12 level was normal.  He has stage IV chronic kidney disease and creatinine had slightly improved to 3.14 from 3.47 in May 2021.  He is followed by Dr. Meredeth Ide He also had seen Dr. Donnetta Hutching.  I last saw him on February 19, 2020.  Since his last evaluation with me, he was started on dialysis therapy for end-stage renal disease he had been hospitalized in early January 2022 with Covid pneumonia and was treated with read them severe and steroids.  After discharge was readmitted several days later on February 01, 2020 with upper GI bleeding, AKI and uremic symptoms.  A transesophageal echo showed an EF of 40 to 45% and there was evidence for a left atrial appendage thrombus measuring 1.5 x 0.6 cm.  He was ultimately given clearance by GI to resume anticoagulation and was transitioned from Xarelto to Eliquis.  He was started on hemodialysis on February 06, 2020 and his symptoms improved with dialysis.  He underwent basilic vein fistula placement and conversion to hemodialysis tunneled catheter on January 31 by Dr. Carlis Abbott.  He is now undergoing dialysis on Monday Wednesday and Friday.  He has been home a week.  He is having dialysis at [facility].    Since I last saw him, he  continues to undergo dialysis on Monday Wednesday and Friday.  He admits to easy bleeding.  He has not had any chest pain.  His last dialysis was this morning.  He denies any chest pain.  He has continued to be on amlodipine 7.5 mg and metoprolol succinate 25 mg daily.  He is on atorvastatin 20 mg for hyperlipidemia.  He has been on Eliquis at 5 mg twice daily dosing.  He has  been undergoing every 3 months remote pacemaker checks.  He presents for evaluation.  Past Medical History:  Diagnosis Date   Arthritis    "shoulders" (09/07/2016)   Atrial fibrillation (HCC)    BPH (benign prostatic hyperplasia)    Chronic kidney disease (CKD), stage III (moderate) (HCC)    Coronary artery disease    DDD (degenerative disc disease), cervical    DVT (deep venous thrombosis) ([place]) 12/2013   LLE   GERD (gastroesophageal reflux disease)    Gout    High cholesterol    History of blood transfusion 09/06/2016   2 u PRBC   History of scarlet fever 1940s   History of stress test 06/2011   No significant ischemia, this is a low risk scan. Clinical correlation recommended Abnormal myocardial perfusion study.   Hx of echocardiogram 05/2009   EF 40-45%, he did have mild annular calcification with mild-to-moderate MR and mild TR as well as aortic valve sclerosis. Estimated RV systolic pressure was 21 mm.   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   Neck pain    "not chronic" (09/07/2016)   Obesity    OSA on CPAP    Presence of permanent cardiac pacemaker    RBBB    Renal insufficiency    Right renal mass    Spinal stenosis of lumbar region     Past Surgical History:  Procedure Laterality Date   APPENDECTOMY     AV FISTULA PLACEMENT Right 02/09/2020   Procedure: ARTERIOVENOUS (AV) FISTULA  CREATION RIGHT UPPER EXTREMITY;  Surgeon: Marty Heck, MD;  Location: [place];  Service: Vascular;  Laterality: Right;   [place] Right 03/30/2020   Procedure: RIGHT SECOND STAGE [place];  Surgeon: Waynetta Sandy, MD;  Location: [place];  Service: Vascular;  Laterality: Right;   CARDIAC CATHETERIZATION  2009   he was found to have a atretic LIMA graft to his LAD and had a patent vein graft supplying his diagonal vessel. At that time, he had 70% narrowing in his LAD beyond a diagonal vessel which was initially treated medically.   COLONOSCOPY WITH PROPOFOL Left 09/09/2016   Procedure: COLONOSCOPY WITH PROPOFOL;  Surgeon: Arta Silence, MD;  Location: [place];  Service: Endoscopy;  Laterality: Left;   CORONARY ANGIOPLASTY WITH STENT PLACEMENT  April 2012   LAD DES   CORONARY ARTERY BYPASS GRAFT  1992   with LIMA to the LAD and diagonal.   CYSTOSCOPY N/A 03/31/2019   Procedure: CYSTOSCOPY FLEXIBLE;  Surgeon: Lucas Mallow, MD;  Location: [facility];  Service: Urology;  Laterality: N/A;   CYSTOSCOPY WITH URETHRAL DILATATION N/A 05/21/2019   Procedure: CYSTOSCOPY WITH URETHRAL BALLOON DILATATION;  Surgeon: Lucas Mallow, MD;  Location: [facility];  Service: Urology;  Laterality: N/A;   ESOPHAGOGASTRODUODENOSCOPY N/A 11/16/2019   Procedure: ESOPHAGOGASTRODUODENOSCOPY (EGD);  Surgeon: Wilford Corner, MD;  Location: [place];  Service: Endoscopy;  Laterality: N/A;   ESOPHAGOGASTRODUODENOSCOPY (EGD) WITH PROPOFOL Left 09/08/2016   Procedure: ESOPHAGOGASTRODUODENOSCOPY (EGD) WITH PROPOFOL;  Surgeon: Ronnette Juniper, MD;  Location: [place];  Service: Gastroenterology;  Laterality: Left;   EXCHANGE OF A DIALYSIS CATHETER Right 02/09/2020   Procedure: EXCHANGE OF A DIALYSIS CATHETER;  Surgeon: Marty Heck, MD;  Location: [place];  Service: Vascular;  Laterality: Right;   GIVENS CAPSULE STUDY N/A 11/16/2019   Procedure: GIVENS CAPSULE STUDY;  Surgeon:  Wilford Corner, MD;  Location: [place];  Service: Endoscopy;  Laterality: N/A;   INGUINAL HERNIA REPAIR     "don't  remeimber which side"   IR FLUORO GUIDE CV LINE RIGHT  02/05/2020   IR US GUIDE VASC ACCESS RIGHT  02/05/2020   LAPAROSCOPIC NEPHRECTOMY Right 03/31/2019   Procedure: RIGHT  HAND ASSIST LAPAROSCOPIC NEPHRECTOMY;  Surgeon: Lucas Mallow, MD;  Location: [facility];  Service: Urology;  Laterality: Right;   PACEMAKER IMPLANT N/A 12/25/2016   Procedure: PACEMAKER IMPLANT;  Surgeon: Deboraha Sprang, MD;  Location: [place] [facility];  Service: Cardiovascular;  Laterality: N/A;   TEE WITHOUT CARDIOVERSION N/A 02/06/2020   Procedure: TRANSESOPHAGEAL ECHOCARDIOGRAM (TEE);  Surgeon: Skeet Latch, MD;  Location: [facility];  Service: Cardiovascular;  Laterality: N/A;   TONSILLECTOMY      Allergies  Allergen Reactions   Simvastatin Other (See Comments)    Abdominal cramps   Nitrostat [Nitroglycerin] Other (See Comments)    Causes blood pressure to "bottom out"    Current Outpatient Medications  Medication Sig Dispense Refill   acetaminophen (TYLENOL) 500 MG tablet Take 500-1,000 mg by mouth every 6 (six) hours as needed for mild pain, moderate pain or headache.      allopurinol (ZYLOPRIM) 300 MG tablet Take 300 mg by mouth daily.     atorvastatin (LIPITOR) 20 MG tablet TAKE 1 TABLET BY MOUTH DAILY 90 tablet 3   B Complex-C (B-COMPLEX WITH VITAMIN C) tablet Take 1 tablet by mouth daily.     ELIQUIS 2.5 MG TABS tablet TAKE 1 TABLET BY MOUTH TWICE DAILY 180 tablet 1   ferric citrate (AURYXIA) 1 GM 210 MG(Fe) tablet Take 210 mg by mouth 3 (three) times daily with meals. Take 2 Pills after each meal     pantoprazole (PROTONIX) 40 MG tablet Take 40 mg by mouth daily.     Tamsulosin HCl (FLOMAX) 0.4 MG CAPS Take 1 capsule (0.4 mg total) by mouth daily. 30 capsule 0   ferrous sulfate 325 (65 FE) MG tablet Take by mouth as needed. During dialysis (Patient not taking: Reported on 06/30/2021)     metoprolol succinate (TOPROL-XL) 25 MG 24 hr tablet TAKE 1 TABLET BY MOUTH DAILY (Patient not taking: Reported  on 06/30/2021) 90 tablet 0   No current facility-administered medications for this visit.    Social History   Socioeconomic History   Marital status: Married    Spouse name: Not on file   Number of children: Not on file   Years of education: Not on file   Highest education level: Not on file  Occupational History   Not on file  Tobacco Use   Smoking status: Former    Packs/day: 1.00    Years: 30.00    Total pack years: 30.00    Types: Cigarettes   Smokeless tobacco: Never   Tobacco comments:    quit ~ 1985  Vaping Use   Vaping Use: Never used  Substance and Sexual Activity   Alcohol use: Not Currently    Alcohol/week: 0.0 standard drinks of alcohol    Comment: rare beer   Drug use: No   Sexual activity: Not Currently  Other Topics Concern   Not on file  Social History Narrative   Not on file   Social Determinants of Health   Financial Resource Strain: Not on file  Food Insecurity: Not on file  Transportation Needs: Not on file  Physical Activity: Not on file  Stress: Not  on file  Social Connections: Not on file  Intimate Partner Violence: Not on file    Socially he is married, has 3 children 7 grandchildren. He does not use tobacco or alcohol.  ROS General: Negative; No fevers, chills, or night sweats; Moderate obesity HEENT: Negative; No changes in vision or hearing, sinus congestion, difficulty swallowing Pulmonary: Negative; No cough, wheezing, shortness of breath, hemoptysis Cardiovascular: Negative; No chest pain, presyncope, syncope, palpatations GI: Recent GI bleed, status post endoscopy and colonoscopy without definitive abnormality. GU: Recent initiation of dialysis Musculoskeletal: History of paraspinal myositis  Hematologic/Oncology: Negative; no easy bruising, bleeding Endocrine: Negative; no heat/cold intolerance; no diabetes Neuro: Occasional paresthesias; no changes in balance, headaches Skin: Negative; No rashes or skin lesions Psychiatric:  Negative; No behavioral problems, depression Sleep: Positive for sleep apnea on CPAP; No snoring, daytime sleepiness, hypersomnolence, bruxism, restless legs, hypnogognic hallucinations, no cataplexy Other comprehensive 14 point system review is negative.   PE BP (!) 140/52 (BP Location: Left Arm, Patient Position: Sitting, Cuff Size: Normal)   Pulse 89   Ht '5\' 6"'$  (1.676 m)   Wt 170 lb (77.1 kg)   SpO2 97%   BMI 27.44 kg/m    Repeat blood pressure by me was 140/60.  Wt Readings from Last 3 Encounters:  06/30/21 170 lb (77.1 kg)  09/06/20 161 lb 12.8 oz (73.4 kg)  04/29/20 161 lb 6.4 oz (73.2 kg)   .takp General: Alert, oriented, no distress.  Skin: Significant bruisability of arms bilaterally HEENT: Normocephalic, atraumatic. Pupils equal round and reactive to light; sclera anicteric; extraocular muscles intact;  Nose without nasal septal hypertrophy Mouth/Parynx benign; Mallinpatti scale 3 Neck: No JVD, no carotid bruits; normal carotid upstroke Lungs: clear to ausculatation and percussion; no wheezing or rales Chest wall: without tenderness to palpitation Heart: PMI not displaced, RRR, s1 s2 normal, 1/6 systolic murmur, no diastolic murmur, no rubs, gallops, thrills, or heaves Abdomen: soft, nontender; no hepatosplenomehaly, BS+; abdominal aorta nontender and not dilated by palpation. Back: no CVA tenderness Pulses 2+ Musculoskeletal: full range of motion, normal strength, no joint deformities Extremities: no clubbing cyanosis or edema, Homan's sign negative  Neurologic: grossly nonfocal; Cranial nerves grossly wnl Psychologic: Normal mood and affect  June 30, 2021 ECG (independently read by me):  Atrial sensed-ventricular paced at 89  September 06, 2020 ECG (independently read by me): AV paced at 60; PR 184 msec  February 19, 2020 ECG (independently read by me): V paced at 81 with 100% capture.  August 2021 ECG (independently read by me): Atrial sensed, ventricular paced  rhythm at 68 bpm  January 13, 2019 ECG (independently read by me): Ventricular paced rhythm at 68 bpm.  June 2019 ECG (independently read by me): Sinus rhythm appropriate atrial sensing and atrial pacing,  right bundle branch block, left axis deviation  September 2018 ECG (independently read by me): normal sinus rhythm at 60 bpm.  Incomplete right bundle branch block.  Mild inferolateral ST changes.  May 2018 ECG (independently read by me): sinus bradycardia 58 bpm.  Left axis deviation.  Right bundle branch block with repolarization changes.  QTc interval 471 ms.  April 2018 ECG (independently read by me):NSR At 61, left axis deviation.  Right bundle branch block.  September 2017 ECG (independently read by me): Normal sinus rhythm with mild sinus arrhythmia.  First degree AV block with a PR interval at 212 ms.  Right bundle-branch block.  March 2017 ECG (independently read by me): Normal sinus rhythm at 61 bpm.  Right bundle branch block with repolarization changes.  QTc interval 477 ms.  PR interval 204 ms.  October 2016 ECG (independently read by me): Normal sinus rhythm at 65 bpm.  Right bundle branch block.  First degree AV block.  June 2015 ECG (independently read by me): Normal sinus rhythm at 61 beats per minute.  Right bundle branch block.  Mild voltage criteria for LVH in aVL  ECG (independently read by me): Sinus rhythm at 60 beats per minute. Right bundle branch block; PAC, inferolateral T abnormalities.  LABS: I personally reviewed the lab work from November 15, 2018.     Latest Ref Rng & Units 03/30/2020    8:02 AM 02/12/2020    1:49 AM 02/11/2020    2:31 AM  BMP  Glucose 70 - 99 mg/dL 113  93  84   BUN 8 - 23 mg/dL '22  26  19   '$ Creatinine 0.61 - 1.24 mg/dL 3.10  3.58  3.23   Sodium 135 - 145 mmol/L 139  141  141   Potassium 3.5 - 5.1 mmol/L 3.2  4.2  4.0   Chloride 98 - 111 mmol/L 93  107  105   CO2 22 - 32 mmol/L  22  25   Calcium 8.9 - 10.3 mg/dL  7.6  7.7        Latest Ref Rng & Units 02/12/2020    1:49 AM 02/10/2020    8:48 AM 02/06/2020    1:46 AM  Hepatic Function  Albumin 3.5 - 5.0 g/dL 1.8  1.7  1.7       Latest Ref Rng & Units 03/30/2020    8:02 AM 02/12/2020    1:49 AM 02/11/2020    2:31 AM  CBC  WBC 4.0 - 10.5 K/uL  9.4  9.2   Hemoglobin 13.0 - 17.0 g/dL 7.8  8.7  8.5   Hematocrit 39.0 - 52.0 % 23.0  27.9  28.5   Platelets 150 - 400 K/uL  141  145    Lab Results  Component Value Date   MCV 85.6 02/12/2020   MCV 86.9 02/11/2020   MCV 87.8 02/10/2020   Lab Results  Component Value Date   TSH 2.159 01/21/2020    Lab Results  Component Value Date   HGBA1C 6.1 (H) 02/04/2020     Lipid Panel     Component Value Date/Time   CHOL 104 12/22/2016 0535   TRIG 91 12/22/2016 0535   HDL 29 (L) 12/22/2016 0535   CHOLHDL 3.6 12/22/2016 0535   VLDL 18 12/22/2016 0535   LDLCALC 57 12/22/2016 0535     BNP No results found for: "PROBNP"  Lipid Panel     Component Value Date/Time   CHOL 104 12/22/2016 0535   TRIG 91 12/22/2016 0535   HDL 29 (L) 12/22/2016 0535   CHOLHDL 3.6 12/22/2016 0535   VLDL 18 12/22/2016 0535   LDLCALC 57 12/22/2016 0535   Lipid Panel     Component Value Date/Time   CHOL 104 12/22/2016 0535   TRIG 91 12/22/2016 0535   HDL 29 (L) 12/22/2016 0535   CHOLHDL 3.6 12/22/2016 0535   VLDL 18 12/22/2016 0535   LDLCALC 57 12/22/2016 0535    RADIOLOGY: No results found.  IMPRESSION:  No diagnosis found.   ASSESSMENT AND PLAN: [name] is an 10 -year-old Caucasian male who is 30 years status post CABG revascularization surgery in 1992 and 10 years since a stent was placed in the LAD  beyond this diagonal vessel in 2012. He has a documented atretic LIMA graft and a patent vein graft which supplies this diagonal graft.  His  echo Doppler study in 2013 showed an ejection fraction of 50-55% and probable grade 2 diastolic dysfunction. He did have mild left atrial dilatation and mild mitral regurgitation.  On his  echo Doppler study in April 2018 EF remained stable at 70-62%; grade 2 diastolic dysfunction, and there was evidence for  severe left atrial enlargement with mild right atrial enlargement and mild pulmonary hypertension.  His nuclear perfusion study remained in the low risk with only a small defect in the mid apical and anterior wall consistent with prior infarction without associated ischemia.  He has a history of PAF which he was started on anticoagulation therapy and also has developed renal insufficiency.  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 Caryl Comes and continues to be followed by Dr. Caryl Comes for pacemaker follow-up.  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  He has obstructive sleep apnea and continues to use CPAP therapy.  I obtained a download from July 28 through September 03, 2020.  Compliance is excellent and average use is 11 hours and 21 minutes.  Night.  Pressure settings are minimum of 11 and maximum pressure of 20 cm.  His 95th percentile pressure is 12.1 with maximum average pressure of 12.9.  Remotely in 2015 he had developed a DVT and at that time was remotely treated with Eliquis.  With his his history of PAF and left atrial thrombus he was subsequently started on Xarelto which ultimately was changed to Eliquis for anticoagulation.  His most recent echo Doppler study has shown an EF of 40 to 45% with thrombus in the left atrial appendage measuring 1.5 x 0.6 cm.  Presently, he has significant bruisability bilaterally.  He is now over 62 years old and has end-stage renal disease.  I have recommended reducing Eliquis to 2.5 mg twice daily.  He will follow-up with Dr. Caryl Comes for pacemaker evaluation and Dr. Shelia Media for primary care.  I will see him in 6 months for  reevaluation.    Troy Sine, MD, [facility]  06/30/2021 3:16 PM

## 2021-06-30 NOTE — Patient Instructions (Signed)
  Follow-Up: At [facility], you and your health needs are our priority.  As part of our continuing mission to provide you with exceptional heart care, we have created designated Provider Care Teams.  These Care Teams include your primary Cardiologist (physician) and Advanced Practice Providers (APPs -  Physician Assistants and Nurse Practitioners) who all work together to provide you with the care you need, when you need it.  We recommend signing up for the patient portal called "MyChart".  Sign up information is provided on this After Visit Summary.  MyChart is used to connect with patients for Virtual Visits (Telemedicine).  Patients are able to view lab/test results, encounter notes, upcoming appointments, etc.  Non-urgent messages can be sent to your provider as well.   To learn more about what you can do with MyChart, go to [email or web address].    Your next appointment:   9 month(s)  The format for your next appointment:   In Person  Provider:   Shelva Majestic, MD      Important Information About Sugar

## 2021-07-01 DIAGNOSIS — Z992 Dependence on renal dialysis: Secondary | ICD-10-CM | POA: Diagnosis not present

## 2021-07-01 DIAGNOSIS — N2581 Secondary hyperparathyroidism of renal origin: Secondary | ICD-10-CM | POA: Diagnosis not present

## 2021-07-01 DIAGNOSIS — D509 Iron deficiency anemia, unspecified: Secondary | ICD-10-CM | POA: Diagnosis not present

## 2021-07-01 DIAGNOSIS — D631 Anemia in chronic kidney disease: Secondary | ICD-10-CM | POA: Diagnosis not present

## 2021-07-01 DIAGNOSIS — N186 End stage renal disease: Secondary | ICD-10-CM | POA: Diagnosis not present

## 2021-07-01 DIAGNOSIS — E876 Hypokalemia: Secondary | ICD-10-CM | POA: Diagnosis not present

## 2021-07-03 ENCOUNTER — Encounter: Payer: Self-pay | Admitting: Cardiovascular Disease

## 2021-07-04 DIAGNOSIS — I959 Hypotension, unspecified: Secondary | ICD-10-CM | POA: Diagnosis not present

## 2021-07-04 DIAGNOSIS — D509 Iron deficiency anemia, unspecified: Secondary | ICD-10-CM | POA: Diagnosis not present

## 2021-07-04 DIAGNOSIS — D631 Anemia in chronic kidney disease: Secondary | ICD-10-CM | POA: Diagnosis not present

## 2021-07-04 DIAGNOSIS — Z992 Dependence on renal dialysis: Secondary | ICD-10-CM | POA: Diagnosis not present

## 2021-07-04 DIAGNOSIS — G4733 Obstructive sleep apnea (adult) (pediatric): Secondary | ICD-10-CM | POA: Diagnosis not present

## 2021-07-04 DIAGNOSIS — E876 Hypokalemia: Secondary | ICD-10-CM | POA: Diagnosis not present

## 2021-07-04 DIAGNOSIS — N186 End stage renal disease: Secondary | ICD-10-CM | POA: Diagnosis not present

## 2021-07-04 DIAGNOSIS — N2581 Secondary hyperparathyroidism of renal origin: Secondary | ICD-10-CM | POA: Diagnosis not present

## 2021-07-06 DIAGNOSIS — D631 Anemia in chronic kidney disease: Secondary | ICD-10-CM | POA: Diagnosis not present

## 2021-07-06 DIAGNOSIS — N186 End stage renal disease: Secondary | ICD-10-CM | POA: Diagnosis not present

## 2021-07-06 DIAGNOSIS — D509 Iron deficiency anemia, unspecified: Secondary | ICD-10-CM | POA: Diagnosis not present

## 2021-07-06 DIAGNOSIS — N2581 Secondary hyperparathyroidism of renal origin: Secondary | ICD-10-CM | POA: Diagnosis not present

## 2021-07-06 DIAGNOSIS — E876 Hypokalemia: Secondary | ICD-10-CM | POA: Diagnosis not present

## 2021-07-06 DIAGNOSIS — Z992 Dependence on renal dialysis: Secondary | ICD-10-CM | POA: Diagnosis not present

## 2021-07-08 DIAGNOSIS — D509 Iron deficiency anemia, unspecified: Secondary | ICD-10-CM | POA: Diagnosis not present

## 2021-07-08 DIAGNOSIS — D631 Anemia in chronic kidney disease: Secondary | ICD-10-CM | POA: Diagnosis not present

## 2021-07-08 DIAGNOSIS — N2581 Secondary hyperparathyroidism of renal origin: Secondary | ICD-10-CM | POA: Diagnosis not present

## 2021-07-08 DIAGNOSIS — E876 Hypokalemia: Secondary | ICD-10-CM | POA: Diagnosis not present

## 2021-07-08 DIAGNOSIS — N186 End stage renal disease: Secondary | ICD-10-CM | POA: Diagnosis not present

## 2021-07-08 DIAGNOSIS — Z992 Dependence on renal dialysis: Secondary | ICD-10-CM | POA: Diagnosis not present

## 2021-07-08 DIAGNOSIS — I129 Hypertensive chronic kidney disease with stage 1 through stage 4 chronic kidney disease, or unspecified chronic kidney disease: Secondary | ICD-10-CM | POA: Diagnosis not present

## 2021-07-11 DIAGNOSIS — N186 End stage renal disease: Secondary | ICD-10-CM | POA: Diagnosis not present

## 2021-07-11 DIAGNOSIS — D509 Iron deficiency anemia, unspecified: Secondary | ICD-10-CM | POA: Diagnosis not present

## 2021-07-11 DIAGNOSIS — Z992 Dependence on renal dialysis: Secondary | ICD-10-CM | POA: Diagnosis not present

## 2021-07-11 DIAGNOSIS — E876 Hypokalemia: Secondary | ICD-10-CM | POA: Diagnosis not present

## 2021-07-11 DIAGNOSIS — N2581 Secondary hyperparathyroidism of renal origin: Secondary | ICD-10-CM | POA: Diagnosis not present

## 2021-07-11 DIAGNOSIS — D631 Anemia in chronic kidney disease: Secondary | ICD-10-CM | POA: Diagnosis not present

## 2021-07-13 DIAGNOSIS — E876 Hypokalemia: Secondary | ICD-10-CM | POA: Diagnosis not present

## 2021-07-13 DIAGNOSIS — N2581 Secondary hyperparathyroidism of renal origin: Secondary | ICD-10-CM | POA: Diagnosis not present

## 2021-07-13 DIAGNOSIS — D631 Anemia in chronic kidney disease: Secondary | ICD-10-CM | POA: Diagnosis not present

## 2021-07-13 DIAGNOSIS — D509 Iron deficiency anemia, unspecified: Secondary | ICD-10-CM | POA: Diagnosis not present

## 2021-07-13 DIAGNOSIS — N186 End stage renal disease: Secondary | ICD-10-CM | POA: Diagnosis not present

## 2021-07-13 DIAGNOSIS — Z992 Dependence on renal dialysis: Secondary | ICD-10-CM | POA: Diagnosis not present

## 2021-07-15 DIAGNOSIS — G4733 Obstructive sleep apnea (adult) (pediatric): Secondary | ICD-10-CM | POA: Diagnosis not present

## 2021-07-15 DIAGNOSIS — N186 End stage renal disease: Secondary | ICD-10-CM | POA: Diagnosis not present

## 2021-07-15 DIAGNOSIS — D631 Anemia in chronic kidney disease: Secondary | ICD-10-CM | POA: Diagnosis not present

## 2021-07-15 DIAGNOSIS — D509 Iron deficiency anemia, unspecified: Secondary | ICD-10-CM | POA: Diagnosis not present

## 2021-07-15 DIAGNOSIS — Z992 Dependence on renal dialysis: Secondary | ICD-10-CM | POA: Diagnosis not present

## 2021-07-15 DIAGNOSIS — N2581 Secondary hyperparathyroidism of renal origin: Secondary | ICD-10-CM | POA: Diagnosis not present

## 2021-07-15 DIAGNOSIS — E876 Hypokalemia: Secondary | ICD-10-CM | POA: Diagnosis not present

## 2021-07-18 DIAGNOSIS — D631 Anemia in chronic kidney disease: Secondary | ICD-10-CM | POA: Diagnosis not present

## 2021-07-18 DIAGNOSIS — Z992 Dependence on renal dialysis: Secondary | ICD-10-CM | POA: Diagnosis not present

## 2021-07-18 DIAGNOSIS — E876 Hypokalemia: Secondary | ICD-10-CM | POA: Diagnosis not present

## 2021-07-18 DIAGNOSIS — D509 Iron deficiency anemia, unspecified: Secondary | ICD-10-CM | POA: Diagnosis not present

## 2021-07-18 DIAGNOSIS — N186 End stage renal disease: Secondary | ICD-10-CM | POA: Diagnosis not present

## 2021-07-18 DIAGNOSIS — N2581 Secondary hyperparathyroidism of renal origin: Secondary | ICD-10-CM | POA: Diagnosis not present

## 2021-07-20 DIAGNOSIS — E876 Hypokalemia: Secondary | ICD-10-CM | POA: Diagnosis not present

## 2021-07-20 DIAGNOSIS — N2581 Secondary hyperparathyroidism of renal origin: Secondary | ICD-10-CM | POA: Diagnosis not present

## 2021-07-20 DIAGNOSIS — D631 Anemia in chronic kidney disease: Secondary | ICD-10-CM | POA: Diagnosis not present

## 2021-07-20 DIAGNOSIS — D509 Iron deficiency anemia, unspecified: Secondary | ICD-10-CM | POA: Diagnosis not present

## 2021-07-20 DIAGNOSIS — N186 End stage renal disease: Secondary | ICD-10-CM | POA: Diagnosis not present

## 2021-07-20 DIAGNOSIS — Z992 Dependence on renal dialysis: Secondary | ICD-10-CM | POA: Diagnosis not present

## 2021-07-22 DIAGNOSIS — N186 End stage renal disease: Secondary | ICD-10-CM | POA: Diagnosis not present

## 2021-07-22 DIAGNOSIS — D509 Iron deficiency anemia, unspecified: Secondary | ICD-10-CM | POA: Diagnosis not present

## 2021-07-22 DIAGNOSIS — D631 Anemia in chronic kidney disease: Secondary | ICD-10-CM | POA: Diagnosis not present

## 2021-07-22 DIAGNOSIS — N2581 Secondary hyperparathyroidism of renal origin: Secondary | ICD-10-CM | POA: Diagnosis not present

## 2021-07-22 DIAGNOSIS — E876 Hypokalemia: Secondary | ICD-10-CM | POA: Diagnosis not present

## 2021-07-22 DIAGNOSIS — Z992 Dependence on renal dialysis: Secondary | ICD-10-CM | POA: Diagnosis not present

## 2021-07-25 DIAGNOSIS — E876 Hypokalemia: Secondary | ICD-10-CM | POA: Diagnosis not present

## 2021-07-25 DIAGNOSIS — N186 End stage renal disease: Secondary | ICD-10-CM | POA: Diagnosis not present

## 2021-07-25 DIAGNOSIS — D509 Iron deficiency anemia, unspecified: Secondary | ICD-10-CM | POA: Diagnosis not present

## 2021-07-25 DIAGNOSIS — N2581 Secondary hyperparathyroidism of renal origin: Secondary | ICD-10-CM | POA: Diagnosis not present

## 2021-07-25 DIAGNOSIS — D631 Anemia in chronic kidney disease: Secondary | ICD-10-CM | POA: Diagnosis not present

## 2021-07-25 DIAGNOSIS — Z992 Dependence on renal dialysis: Secondary | ICD-10-CM | POA: Diagnosis not present

## 2021-07-26 ENCOUNTER — Encounter: Payer: Self-pay | Admitting: Internal Medicine

## 2021-07-26 ENCOUNTER — Ambulatory Visit (INDEPENDENT_AMBULATORY_CARE_PROVIDER_SITE_OTHER): Payer: Medicare Other | Admitting: Internal Medicine

## 2021-07-26 VITALS — BP 134/68 | HR 83 | Ht 66.0 in | Wt 167.4 lb

## 2021-07-26 DIAGNOSIS — Z950 Presence of cardiac pacemaker: Secondary | ICD-10-CM | POA: Diagnosis not present

## 2021-07-26 DIAGNOSIS — I480 Paroxysmal atrial fibrillation: Secondary | ICD-10-CM

## 2021-07-26 DIAGNOSIS — I5042 Chronic combined systolic (congestive) and diastolic (congestive) heart failure: Secondary | ICD-10-CM | POA: Diagnosis not present

## 2021-07-26 DIAGNOSIS — R001 Bradycardia, unspecified: Secondary | ICD-10-CM

## 2021-07-26 DIAGNOSIS — N186 End stage renal disease: Secondary | ICD-10-CM | POA: Diagnosis not present

## 2021-07-26 DIAGNOSIS — I442 Atrioventricular block, complete: Secondary | ICD-10-CM | POA: Diagnosis not present

## 2021-07-26 DIAGNOSIS — R55 Syncope and collapse: Secondary | ICD-10-CM

## 2021-07-26 DIAGNOSIS — Z992 Dependence on renal dialysis: Secondary | ICD-10-CM | POA: Diagnosis not present

## 2021-07-26 DIAGNOSIS — I871 Compression of vein: Secondary | ICD-10-CM | POA: Diagnosis not present

## 2021-07-26 DIAGNOSIS — T82858A Stenosis of vascular prosthetic devices, implants and grafts, initial encounter: Secondary | ICD-10-CM | POA: Diagnosis not present

## 2021-07-26 NOTE — Progress Notes (Signed)
Patient Care Team: [name], MD as PCP - General (Internal Medicine) Troy Sine, MD as PCP - Cardiology (Cardiology) Center, [facility]   HPI  Paul Price is a 81 y.o. 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     He has a history of ischemic heart disease with prior bypass surgery and PCI-stenting LAD 2012   Paroxysmal atrial fibrillation and is anticoagulated with Xarelto>>Apixoban    Without significant bleeding  The patient denies chest pain,  , nocturnal dyspnea  orthopnea or peripheral edem.  There have been no palpitations, lightheadedness or syncope.  Variable exercise tolerance but has noted no significant improvement since the r return of sinus rhythm in the last few days  Admitted 1/22 with Covid infection and GI bleeding.  Hospital course notable for UTI secondary to staph aureus without bacteremia.  TEE recommended by Dr. Nigel Mormon was negative  Progressive renal insufficiency with AV fistula placement and then renal replacement therapy with hemodialysis started 1/22   DATE TEST EF   4/18 MYOVIEW  47 % Small scar w/o ischemia   12/18 Echo   40-45% % LVH mod  12/21 Echo  40-45%   1/22 TEE 40-45% No evidence of infection               Date Cr K Hgb  12/18 2.26  11.2  11/19 1.85 4.2 10.7  9/20 2.47 4.5 12.1  2/22 3.58 4.2 8.7  1/23 4.4      Thromboembolic risk factors ( age -104, HTN-1, CHF-1) for a CHADSVASc Score of >=4 \ Records and Results Reviewed   Past Medical History:  Diagnosis Date   Arthritis    "shoulders" (09/07/2016)   Atrial fibrillation (HCC)    BPH (benign prostatic hyperplasia)    Chronic kidney disease (CKD), stage III (moderate) (HCC)    Coronary artery disease    DDD (degenerative disc disease), cervical    DVT (deep venous thrombosis) ([place]) 12/2013   LLE   GERD (gastroesophageal reflux disease)    Gout    High cholesterol    History  of blood transfusion 09/06/2016   2 u PRBC   History of scarlet fever 1940s   History of stress test 06/2011   No significant ischemia, this is a low risk scan. Clinical correlation recommended Abnormal myocardial perfusion study.   Hx of echocardiogram 05/2009   EF 40-45%, he did have mild annular calcification with mild-to-moderate MR and mild TR as well as aortic valve sclerosis. Estimated RV systolic pressure was 21 mm.   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   Neck pain    "not chronic" (09/07/2016)   Obesity    OSA on CPAP    Presence of permanent cardiac pacemaker    RBBB    Renal insufficiency    Right renal mass    Spinal stenosis of lumbar region     Past Surgical History:  Procedure Laterality Date   APPENDECTOMY     AV FISTULA PLACEMENT Right 02/09/2020   Procedure: ARTERIOVENOUS (AV) FISTULA  CREATION RIGHT UPPER EXTREMITY;  Surgeon: Marty Heck, MD;  Location: [place];  Service: Vascular;  Laterality: Right;   [place] Right 03/30/2020   Procedure: RIGHT SECOND STAGE [place];  Surgeon: Waynetta Sandy, MD;  Location: [place];  Service: Vascular;  Laterality: Right;   CARDIAC CATHETERIZATION  2009   he was found to have a atretic LIMA graft to his LAD and had a patent vein graft supplying his diagonal vessel. At that time, he had 70% narrowing in his LAD beyond a diagonal vessel which was initially treated medically.   COLONOSCOPY WITH PROPOFOL Left 09/09/2016   Procedure: COLONOSCOPY WITH PROPOFOL;  Surgeon: Arta Silence, MD;  Location: [place];  Service: Endoscopy;  Laterality: Left;   CORONARY ANGIOPLASTY WITH STENT PLACEMENT  April 2012   LAD DES   CORONARY ARTERY BYPASS GRAFT  1992   with LIMA to the LAD and diagonal.   CYSTOSCOPY N/A 03/31/2019   Procedure: CYSTOSCOPY FLEXIBLE;  Surgeon: Lucas Mallow, MD;  Location: [facility];  Service:  Urology;  Laterality: N/A;   CYSTOSCOPY WITH URETHRAL DILATATION N/A 05/21/2019   Procedure: CYSTOSCOPY WITH URETHRAL BALLOON DILATATION;  Surgeon: Lucas Mallow, MD;  Location: [facility];  Service: Urology;  Laterality: N/A;   ESOPHAGOGASTRODUODENOSCOPY N/A 11/16/2019   Procedure: ESOPHAGOGASTRODUODENOSCOPY (EGD);  Surgeon: Wilford Corner, MD;  Location: [place];  Service: Endoscopy;  Laterality: N/A;   ESOPHAGOGASTRODUODENOSCOPY (EGD) WITH PROPOFOL Left 09/08/2016   Procedure: ESOPHAGOGASTRODUODENOSCOPY (EGD) WITH PROPOFOL;  Surgeon: Ronnette Juniper, MD;  Location: [place];  Service: Gastroenterology;  Laterality: Left;   EXCHANGE OF A DIALYSIS CATHETER Right 02/09/2020   Procedure: EXCHANGE OF A DIALYSIS CATHETER;  Surgeon: Marty Heck, MD;  Location: [place];  Service: Vascular;  Laterality: Right;   GIVENS CAPSULE STUDY N/A 11/16/2019   Procedure: GIVENS CAPSULE STUDY;  Surgeon: Wilford Corner, MD;  Location: [place];  Service: Endoscopy;  Laterality: N/A;   INGUINAL HERNIA REPAIR     "don't remeimber which side"   IR FLUORO GUIDE CV LINE RIGHT  02/05/2020   IR US GUIDE VASC ACCESS RIGHT  02/05/2020   LAPAROSCOPIC NEPHRECTOMY Right 03/31/2019   Procedure: RIGHT  HAND ASSIST LAPAROSCOPIC NEPHRECTOMY;  Surgeon: Lucas Mallow, MD;  Location[facility];  Service: Urology;  Laterality: Right;   PACEMAKER IMPLANT N/A 12/25/2016   Procedure: PACEMAKER IMPLANT;  Surgeon: Deboraha Sprang, MD;  Location: [place] [facility];  Service: Cardiovascular;  Laterality: N/A;   TEE WITHOUT CARDIOVERSION N/A 02/06/2020   Procedure: TRANSESOPHAGEAL ECHOCARDIOGRAM (TEE);  Surgeon: Skeet Latch, MD;  Location: [facility]OS[facility];  Service: Cardiovascular;  Laterality: N/A;   TONSILLECTOMY      Current Outpatient Medications  Medication Sig Dispense Refill   acetaminophen (TYLENOL) 500 MG tablet Take 500-1,000 mg by mouth every 6 (six) hours as needed for mild pain, moderate pain or  headache.      allopurinol (ZYLOPRIM) 300 MG tablet Take 300 mg by mouth daily.     atorvastatin (LIPITOR) 20 MG tablet TAKE 1 TABLET BY MOUTH DAILY 90 tablet 3   B Complex-C (B-COMPLEX WITH VITAMIN C) tablet Take 1 tablet by mouth daily.     ELIQUIS 2.5 MG TABS tablet TAKE 1 TABLET BY MOUTH TWICE DAILY 180 tablet 1   ferric citrate (AURYXIA) 1 GM 210 MG(Fe) tablet Take 210 mg by mouth 3 (three) times daily with meals. Take 2 Pills after each meal     ferrous sulfate 325 (65 FE) MG tablet Take by mouth as needed. During dialysis     pantoprazole (PROTONIX) 40 MG tablet Take 40 mg by mouth daily.     Tamsulosin HCl (FLOMAX) 0.4 MG CAPS Take 1 capsule (0.4 mg total) by mouth  daily. 30 capsule 0   VITAMIN B1-B12 IJ Inject as directed. Once per week     metoprolol succinate (TOPROL-XL) 25 MG 24 hr tablet TAKE 1 TABLET BY MOUTH DAILY (Patient not taking: Reported on 07/26/2021) 90 tablet 0   No current facility-administered medications for this visit.    Allergies  Allergen Reactions   Simvastatin Other (See Comments)    Abdominal cramps   Nitrostat [Nitroglycerin] Other (See Comments)    Causes blood pressure to "bottom out"      Review of Systems negative except from HPI and PMH  Physical Exam BP 134/68   Pulse 83   Ht '5\' 6"'$  (1.676 m)   Wt 167 lb 6.4 oz (75.9 kg)   SpO2 98%   BMI 27.02 kg/m  Well developed and well nourished in no acute distress HENT normal Neck supple with JVP-flat Clear Device pocket well healed; without hematoma or erythema.  There is no tethering  Regular rate and rhythm, no  gallop No  murmur Abd-soft with active BS No Clubbing cyanosis   edema Skin-warm and dry  exxhymosis A & Oriented  Grossly normal sensory and motor function  ECG sinus rhythm at 83 with pseudofusion versus His bundle pacing with capture  Assessment and  Plan   Sinus exit block with symptomatic bradycardia   Complete heart block  Pacemaker Medtronic    His bundle pacing     Ischemic heart disease with prior bypass ejection fraction 40-45%   Hypertension   Syncope   Heart failure -- chronic systolic/diastolic   Renal Insufficiency  Gd  4   Interval atrial fibrillation.  Greater than 24 hours duration.  Lengthy discussion regarding the role of anticoagulation.  Reviewed a number of papers including the most recent European guidelines, all of which highlight the lack of data for benefit of anticoagulation in patients on end-stage renal disease and mentioned, in discussions with the family, that the kidney guidelines and suggested that in patients who have had prior stroke it is more appropriate.  Having had these discussions, we have elected to discontinue the Eliquis.  He is having post dialysis hypotension.  I reached out to Dr. Hollie Salk as to whether he would be a candidate for postdialysis midodrine.  No angina.  Will defer to Dr. Claiborne Billings the role of aspirin in the absence of anticoagulation  Blood pressure is well controlled continue him off antihypertensives

## 2021-07-26 NOTE — Patient Instructions (Signed)
Medication Instructions:  Your physician has recommended you make the following change in your medication:   ** Stop Eliquis  *If you need a refill on your cardiac medications before your next appointment, please call your pharmacy*   Lab Work: None ordered.  If you have labs (blood work) drawn today and your tests are completely normal, you will receive your results only by: [place] (if you have MyChart) OR A paper copy in the mail If you have any lab test that is abnormal or we need to change your treatment, we will call you to review the results.   Testing/Procedures: None ordered.    Follow-Up: At [facility], you and your health needs are our priority.  As part of our continuing mission to provide you with exceptional heart care, we have created designated Provider Care Teams.  These Care Teams include your primary Cardiologist (physician) and Advanced Practice Providers (APPs -  Physician Assistants and Nurse Practitioners) who all work together to provide you with the care you need, when you need it.  We recommend signing up for the patient portal called "MyChart".  Sign up information is provided on this After Visit Summary.  MyChart is used to connect with patients for Virtual Visits (Telemedicine).  Patients are able to view lab/test results, encounter notes, upcoming appointments, etc.  Non-urgent messages can be sent to your provider as well.   To learn more about what you can do with MyChart, go to [email or web address].    Your next appointment:   12 months with Dr Caryl Comes  Important Information About Sugar

## 2021-07-27 DIAGNOSIS — E876 Hypokalemia: Secondary | ICD-10-CM | POA: Diagnosis not present

## 2021-07-27 DIAGNOSIS — D509 Iron deficiency anemia, unspecified: Secondary | ICD-10-CM | POA: Diagnosis not present

## 2021-07-27 DIAGNOSIS — N186 End stage renal disease: Secondary | ICD-10-CM | POA: Diagnosis not present

## 2021-07-27 DIAGNOSIS — D631 Anemia in chronic kidney disease: Secondary | ICD-10-CM | POA: Diagnosis not present

## 2021-07-27 DIAGNOSIS — N2581 Secondary hyperparathyroidism of renal origin: Secondary | ICD-10-CM | POA: Diagnosis not present

## 2021-07-27 DIAGNOSIS — Z992 Dependence on renal dialysis: Secondary | ICD-10-CM | POA: Diagnosis not present

## 2021-07-28 ENCOUNTER — Telehealth: Payer: Self-pay | Admitting: Internal Medicine

## 2021-07-28 MED ORDER — MIDODRINE HCL 2.5 MG PO TABS: ORAL_TABLET | ORAL | 3 refills | Status: DC

## 2021-07-28 NOTE — Telephone Encounter (Signed)
Spoke with pt and advised per Dr Caryl Comes begin Midodrine 2.'5mg'$  - 1 tablet by mouth immediately after dialysis and then 1 tablet 4 hours later.  Pt verbalizes understanding and thanked Therapist, sports for the call.

## 2021-07-28 NOTE — Telephone Encounter (Signed)
Pt c/o medication issue:  1. Name of Medication: Metoprolol  2. How are you currently taking this medication (dosage and times per day)?   3. Are you having a reaction (difficulty breathing--STAT)?   4. What is your medication issue? Patient said hen he saw Dr Caryl Comes on Tuesday, he thought he said he wanted him to start on Metoprolol. He said he checked with the pharmacist, it have not been called in He says he needs it today because he have Dialysis tomorrow and Dr Caryl Comes wanted him to take I before his Dialysis

## 2021-07-29 DIAGNOSIS — Z992 Dependence on renal dialysis: Secondary | ICD-10-CM | POA: Diagnosis not present

## 2021-07-29 DIAGNOSIS — D509 Iron deficiency anemia, unspecified: Secondary | ICD-10-CM | POA: Diagnosis not present

## 2021-07-29 DIAGNOSIS — D631 Anemia in chronic kidney disease: Secondary | ICD-10-CM | POA: Diagnosis not present

## 2021-07-29 DIAGNOSIS — N2581 Secondary hyperparathyroidism of renal origin: Secondary | ICD-10-CM | POA: Diagnosis not present

## 2021-07-29 DIAGNOSIS — N186 End stage renal disease: Secondary | ICD-10-CM | POA: Diagnosis not present

## 2021-07-29 DIAGNOSIS — E876 Hypokalemia: Secondary | ICD-10-CM | POA: Diagnosis not present

## 2021-08-01 DIAGNOSIS — N2581 Secondary hyperparathyroidism of renal origin: Secondary | ICD-10-CM | POA: Diagnosis not present

## 2021-08-01 DIAGNOSIS — Z992 Dependence on renal dialysis: Secondary | ICD-10-CM | POA: Diagnosis not present

## 2021-08-01 DIAGNOSIS — D509 Iron deficiency anemia, unspecified: Secondary | ICD-10-CM | POA: Diagnosis not present

## 2021-08-01 DIAGNOSIS — N186 End stage renal disease: Secondary | ICD-10-CM | POA: Diagnosis not present

## 2021-08-01 DIAGNOSIS — D631 Anemia in chronic kidney disease: Secondary | ICD-10-CM | POA: Diagnosis not present

## 2021-08-01 DIAGNOSIS — E876 Hypokalemia: Secondary | ICD-10-CM | POA: Diagnosis not present

## 2021-08-03 DIAGNOSIS — D631 Anemia in chronic kidney disease: Secondary | ICD-10-CM | POA: Diagnosis not present

## 2021-08-03 DIAGNOSIS — D509 Iron deficiency anemia, unspecified: Secondary | ICD-10-CM | POA: Diagnosis not present

## 2021-08-03 DIAGNOSIS — N186 End stage renal disease: Secondary | ICD-10-CM | POA: Diagnosis not present

## 2021-08-03 DIAGNOSIS — E876 Hypokalemia: Secondary | ICD-10-CM | POA: Diagnosis not present

## 2021-08-03 DIAGNOSIS — Z992 Dependence on renal dialysis: Secondary | ICD-10-CM | POA: Diagnosis not present

## 2021-08-03 DIAGNOSIS — N2581 Secondary hyperparathyroidism of renal origin: Secondary | ICD-10-CM | POA: Diagnosis not present

## 2021-08-04 ENCOUNTER — Ambulatory Visit (INDEPENDENT_AMBULATORY_CARE_PROVIDER_SITE_OTHER): Payer: Medicare Other

## 2021-08-04 DIAGNOSIS — I442 Atrioventricular block, complete: Secondary | ICD-10-CM

## 2021-08-05 DIAGNOSIS — Z992 Dependence on renal dialysis: Secondary | ICD-10-CM | POA: Diagnosis not present

## 2021-08-05 DIAGNOSIS — E876 Hypokalemia: Secondary | ICD-10-CM | POA: Diagnosis not present

## 2021-08-05 DIAGNOSIS — D631 Anemia in chronic kidney disease: Secondary | ICD-10-CM | POA: Diagnosis not present

## 2021-08-05 DIAGNOSIS — N2581 Secondary hyperparathyroidism of renal origin: Secondary | ICD-10-CM | POA: Diagnosis not present

## 2021-08-05 DIAGNOSIS — D509 Iron deficiency anemia, unspecified: Secondary | ICD-10-CM | POA: Diagnosis not present

## 2021-08-05 DIAGNOSIS — N186 End stage renal disease: Secondary | ICD-10-CM | POA: Diagnosis not present

## 2021-08-05 LAB — CUP PACEART REMOTE DEVICE CHECK
Battery Remaining Longevity: 40 mo
Battery Voltage: 2.88 V
Brady Statistic AP VP Percent: 10.91 %
Brady Statistic AP VS Percent: 0.81 %
Brady Statistic AS VP Percent: 81.37 %
Brady Statistic AS VS Percent: 6.9 %
Brady Statistic RA Percent Paced: 11.85 %
Brady Statistic RV Percent Paced: 92.18 %
Date Time Interrogation Session: 20230727140648
Implantable Lead Implant Date: 20181217
Implantable Lead Implant Date: 20181217
Implantable Lead Location: 753859
Implantable Lead Location: 753859
Implantable Lead Model: 3830
Implantable Lead Model: 5076
Implantable Pulse Generator Implant Date: 20181217
Lead Channel Impedance Value: 323 Ohm
Lead Channel Impedance Value: 361 Ohm
Lead Channel Impedance Value: 418 Ohm
Lead Channel Impedance Value: 437 Ohm
Lead Channel Pacing Threshold Amplitude: 0.5 V
Lead Channel Pacing Threshold Amplitude: 0.75 V
Lead Channel Pacing Threshold Pulse Width: 0.4 ms
Lead Channel Pacing Threshold Pulse Width: 0.4 ms
Lead Channel Sensing Intrinsic Amplitude: 0.625 mV
Lead Channel Sensing Intrinsic Amplitude: 0.625 mV
Lead Channel Sensing Intrinsic Amplitude: 12 mV
Lead Channel Sensing Intrinsic Amplitude: 12 mV
Lead Channel Setting Pacing Amplitude: 2 V
Lead Channel Setting Pacing Amplitude: 2.5 V
Lead Channel Setting Pacing Pulse Width: 1 ms
Lead Channel Setting Sensing Sensitivity: 1.2 mV

## 2021-08-08 DIAGNOSIS — D509 Iron deficiency anemia, unspecified: Secondary | ICD-10-CM | POA: Diagnosis not present

## 2021-08-08 DIAGNOSIS — E876 Hypokalemia: Secondary | ICD-10-CM | POA: Diagnosis not present

## 2021-08-08 DIAGNOSIS — N2581 Secondary hyperparathyroidism of renal origin: Secondary | ICD-10-CM | POA: Diagnosis not present

## 2021-08-08 DIAGNOSIS — I129 Hypertensive chronic kidney disease with stage 1 through stage 4 chronic kidney disease, or unspecified chronic kidney disease: Secondary | ICD-10-CM | POA: Diagnosis not present

## 2021-08-08 DIAGNOSIS — Z992 Dependence on renal dialysis: Secondary | ICD-10-CM | POA: Diagnosis not present

## 2021-08-08 DIAGNOSIS — N186 End stage renal disease: Secondary | ICD-10-CM | POA: Diagnosis not present

## 2021-08-08 DIAGNOSIS — D631 Anemia in chronic kidney disease: Secondary | ICD-10-CM | POA: Diagnosis not present

## 2021-08-10 DIAGNOSIS — Z992 Dependence on renal dialysis: Secondary | ICD-10-CM | POA: Diagnosis not present

## 2021-08-10 DIAGNOSIS — N2581 Secondary hyperparathyroidism of renal origin: Secondary | ICD-10-CM | POA: Diagnosis not present

## 2021-08-10 DIAGNOSIS — N186 End stage renal disease: Secondary | ICD-10-CM | POA: Diagnosis not present

## 2021-08-10 DIAGNOSIS — D509 Iron deficiency anemia, unspecified: Secondary | ICD-10-CM | POA: Diagnosis not present

## 2021-08-10 DIAGNOSIS — D631 Anemia in chronic kidney disease: Secondary | ICD-10-CM | POA: Diagnosis not present

## 2021-08-10 DIAGNOSIS — E876 Hypokalemia: Secondary | ICD-10-CM | POA: Diagnosis not present

## 2021-08-12 DIAGNOSIS — E876 Hypokalemia: Secondary | ICD-10-CM | POA: Diagnosis not present

## 2021-08-12 DIAGNOSIS — N2581 Secondary hyperparathyroidism of renal origin: Secondary | ICD-10-CM | POA: Diagnosis not present

## 2021-08-12 DIAGNOSIS — N186 End stage renal disease: Secondary | ICD-10-CM | POA: Diagnosis not present

## 2021-08-12 DIAGNOSIS — Z992 Dependence on renal dialysis: Secondary | ICD-10-CM | POA: Diagnosis not present

## 2021-08-12 DIAGNOSIS — D509 Iron deficiency anemia, unspecified: Secondary | ICD-10-CM | POA: Diagnosis not present

## 2021-08-12 DIAGNOSIS — D631 Anemia in chronic kidney disease: Secondary | ICD-10-CM | POA: Diagnosis not present

## 2021-08-15 DIAGNOSIS — D509 Iron deficiency anemia, unspecified: Secondary | ICD-10-CM | POA: Diagnosis not present

## 2021-08-15 DIAGNOSIS — E876 Hypokalemia: Secondary | ICD-10-CM | POA: Diagnosis not present

## 2021-08-15 DIAGNOSIS — N186 End stage renal disease: Secondary | ICD-10-CM | POA: Diagnosis not present

## 2021-08-15 DIAGNOSIS — N2581 Secondary hyperparathyroidism of renal origin: Secondary | ICD-10-CM | POA: Diagnosis not present

## 2021-08-15 DIAGNOSIS — D631 Anemia in chronic kidney disease: Secondary | ICD-10-CM | POA: Diagnosis not present

## 2021-08-15 DIAGNOSIS — Z992 Dependence on renal dialysis: Secondary | ICD-10-CM | POA: Diagnosis not present

## 2021-08-17 DIAGNOSIS — E876 Hypokalemia: Secondary | ICD-10-CM | POA: Diagnosis not present

## 2021-08-17 DIAGNOSIS — Z992 Dependence on renal dialysis: Secondary | ICD-10-CM | POA: Diagnosis not present

## 2021-08-17 DIAGNOSIS — N2581 Secondary hyperparathyroidism of renal origin: Secondary | ICD-10-CM | POA: Diagnosis not present

## 2021-08-17 DIAGNOSIS — D631 Anemia in chronic kidney disease: Secondary | ICD-10-CM | POA: Diagnosis not present

## 2021-08-17 DIAGNOSIS — D509 Iron deficiency anemia, unspecified: Secondary | ICD-10-CM | POA: Diagnosis not present

## 2021-08-17 DIAGNOSIS — N186 End stage renal disease: Secondary | ICD-10-CM | POA: Diagnosis not present

## 2021-08-19 DIAGNOSIS — D631 Anemia in chronic kidney disease: Secondary | ICD-10-CM | POA: Diagnosis not present

## 2021-08-19 DIAGNOSIS — N2581 Secondary hyperparathyroidism of renal origin: Secondary | ICD-10-CM | POA: Diagnosis not present

## 2021-08-19 DIAGNOSIS — Z992 Dependence on renal dialysis: Secondary | ICD-10-CM | POA: Diagnosis not present

## 2021-08-19 DIAGNOSIS — E876 Hypokalemia: Secondary | ICD-10-CM | POA: Diagnosis not present

## 2021-08-19 DIAGNOSIS — N186 End stage renal disease: Secondary | ICD-10-CM | POA: Diagnosis not present

## 2021-08-19 DIAGNOSIS — D509 Iron deficiency anemia, unspecified: Secondary | ICD-10-CM | POA: Diagnosis not present

## 2021-08-22 DIAGNOSIS — N186 End stage renal disease: Secondary | ICD-10-CM | POA: Diagnosis not present

## 2021-08-22 DIAGNOSIS — D509 Iron deficiency anemia, unspecified: Secondary | ICD-10-CM | POA: Diagnosis not present

## 2021-08-22 DIAGNOSIS — D631 Anemia in chronic kidney disease: Secondary | ICD-10-CM | POA: Diagnosis not present

## 2021-08-22 DIAGNOSIS — E876 Hypokalemia: Secondary | ICD-10-CM | POA: Diagnosis not present

## 2021-08-22 DIAGNOSIS — N2581 Secondary hyperparathyroidism of renal origin: Secondary | ICD-10-CM | POA: Diagnosis not present

## 2021-08-22 DIAGNOSIS — Z992 Dependence on renal dialysis: Secondary | ICD-10-CM | POA: Diagnosis not present

## 2021-08-24 DIAGNOSIS — N186 End stage renal disease: Secondary | ICD-10-CM | POA: Diagnosis not present

## 2021-08-24 DIAGNOSIS — N2581 Secondary hyperparathyroidism of renal origin: Secondary | ICD-10-CM | POA: Diagnosis not present

## 2021-08-24 DIAGNOSIS — E876 Hypokalemia: Secondary | ICD-10-CM | POA: Diagnosis not present

## 2021-08-24 DIAGNOSIS — D631 Anemia in chronic kidney disease: Secondary | ICD-10-CM | POA: Diagnosis not present

## 2021-08-24 DIAGNOSIS — Z992 Dependence on renal dialysis: Secondary | ICD-10-CM | POA: Diagnosis not present

## 2021-08-24 DIAGNOSIS — D509 Iron deficiency anemia, unspecified: Secondary | ICD-10-CM | POA: Diagnosis not present

## 2021-08-25 NOTE — Progress Notes (Signed)
Remote pacemaker transmission.   

## 2021-08-26 DIAGNOSIS — E876 Hypokalemia: Secondary | ICD-10-CM | POA: Diagnosis not present

## 2021-08-26 DIAGNOSIS — D631 Anemia in chronic kidney disease: Secondary | ICD-10-CM | POA: Diagnosis not present

## 2021-08-26 DIAGNOSIS — Z992 Dependence on renal dialysis: Secondary | ICD-10-CM | POA: Diagnosis not present

## 2021-08-26 DIAGNOSIS — N2581 Secondary hyperparathyroidism of renal origin: Secondary | ICD-10-CM | POA: Diagnosis not present

## 2021-08-26 DIAGNOSIS — D509 Iron deficiency anemia, unspecified: Secondary | ICD-10-CM | POA: Diagnosis not present

## 2021-08-26 DIAGNOSIS — N186 End stage renal disease: Secondary | ICD-10-CM | POA: Diagnosis not present

## 2021-08-29 DIAGNOSIS — N2581 Secondary hyperparathyroidism of renal origin: Secondary | ICD-10-CM | POA: Diagnosis not present

## 2021-08-29 DIAGNOSIS — D509 Iron deficiency anemia, unspecified: Secondary | ICD-10-CM | POA: Diagnosis not present

## 2021-08-29 DIAGNOSIS — D631 Anemia in chronic kidney disease: Secondary | ICD-10-CM | POA: Diagnosis not present

## 2021-08-29 DIAGNOSIS — N186 End stage renal disease: Secondary | ICD-10-CM | POA: Diagnosis not present

## 2021-08-29 DIAGNOSIS — E876 Hypokalemia: Secondary | ICD-10-CM | POA: Diagnosis not present

## 2021-08-29 DIAGNOSIS — Z992 Dependence on renal dialysis: Secondary | ICD-10-CM | POA: Diagnosis not present

## 2021-08-31 DIAGNOSIS — N2581 Secondary hyperparathyroidism of renal origin: Secondary | ICD-10-CM | POA: Diagnosis not present

## 2021-08-31 DIAGNOSIS — E876 Hypokalemia: Secondary | ICD-10-CM | POA: Diagnosis not present

## 2021-08-31 DIAGNOSIS — D509 Iron deficiency anemia, unspecified: Secondary | ICD-10-CM | POA: Diagnosis not present

## 2021-08-31 DIAGNOSIS — N186 End stage renal disease: Secondary | ICD-10-CM | POA: Diagnosis not present

## 2021-08-31 DIAGNOSIS — Z992 Dependence on renal dialysis: Secondary | ICD-10-CM | POA: Diagnosis not present

## 2021-08-31 DIAGNOSIS — D631 Anemia in chronic kidney disease: Secondary | ICD-10-CM | POA: Diagnosis not present

## 2021-09-01 DIAGNOSIS — K8020 Calculus of gallbladder without cholecystitis without obstruction: Secondary | ICD-10-CM | POA: Diagnosis not present

## 2021-09-01 DIAGNOSIS — C641 Malignant neoplasm of right kidney, except renal pelvis: Secondary | ICD-10-CM | POA: Diagnosis not present

## 2021-09-01 DIAGNOSIS — I700 Atherosclerosis of aorta: Secondary | ICD-10-CM | POA: Diagnosis not present

## 2021-09-01 DIAGNOSIS — I2510 Atherosclerotic heart disease of native coronary artery without angina pectoris: Secondary | ICD-10-CM | POA: Diagnosis not present

## 2021-09-01 DIAGNOSIS — R918 Other nonspecific abnormal finding of lung field: Secondary | ICD-10-CM | POA: Diagnosis not present

## 2021-09-01 DIAGNOSIS — Z85528 Personal history of other malignant neoplasm of kidney: Secondary | ICD-10-CM | POA: Diagnosis not present

## 2021-09-01 DIAGNOSIS — K5730 Diverticulosis of large intestine without perforation or abscess without bleeding: Secondary | ICD-10-CM | POA: Diagnosis not present

## 2021-09-02 DIAGNOSIS — E876 Hypokalemia: Secondary | ICD-10-CM | POA: Diagnosis not present

## 2021-09-02 DIAGNOSIS — D509 Iron deficiency anemia, unspecified: Secondary | ICD-10-CM | POA: Diagnosis not present

## 2021-09-02 DIAGNOSIS — N186 End stage renal disease: Secondary | ICD-10-CM | POA: Diagnosis not present

## 2021-09-02 DIAGNOSIS — Z992 Dependence on renal dialysis: Secondary | ICD-10-CM | POA: Diagnosis not present

## 2021-09-02 DIAGNOSIS — D631 Anemia in chronic kidney disease: Secondary | ICD-10-CM | POA: Diagnosis not present

## 2021-09-02 DIAGNOSIS — N2581 Secondary hyperparathyroidism of renal origin: Secondary | ICD-10-CM | POA: Diagnosis not present

## 2021-09-05 DIAGNOSIS — N2581 Secondary hyperparathyroidism of renal origin: Secondary | ICD-10-CM | POA: Diagnosis not present

## 2021-09-05 DIAGNOSIS — Z992 Dependence on renal dialysis: Secondary | ICD-10-CM | POA: Diagnosis not present

## 2021-09-05 DIAGNOSIS — D509 Iron deficiency anemia, unspecified: Secondary | ICD-10-CM | POA: Diagnosis not present

## 2021-09-05 DIAGNOSIS — E876 Hypokalemia: Secondary | ICD-10-CM | POA: Diagnosis not present

## 2021-09-05 DIAGNOSIS — D631 Anemia in chronic kidney disease: Secondary | ICD-10-CM | POA: Diagnosis not present

## 2021-09-05 DIAGNOSIS — N186 End stage renal disease: Secondary | ICD-10-CM | POA: Diagnosis not present

## 2021-09-07 DIAGNOSIS — N186 End stage renal disease: Secondary | ICD-10-CM | POA: Diagnosis not present

## 2021-09-07 DIAGNOSIS — Z992 Dependence on renal dialysis: Secondary | ICD-10-CM | POA: Diagnosis not present

## 2021-09-07 DIAGNOSIS — N2581 Secondary hyperparathyroidism of renal origin: Secondary | ICD-10-CM | POA: Diagnosis not present

## 2021-09-07 DIAGNOSIS — D631 Anemia in chronic kidney disease: Secondary | ICD-10-CM | POA: Diagnosis not present

## 2021-09-07 DIAGNOSIS — E876 Hypokalemia: Secondary | ICD-10-CM | POA: Diagnosis not present

## 2021-09-07 DIAGNOSIS — D509 Iron deficiency anemia, unspecified: Secondary | ICD-10-CM | POA: Diagnosis not present

## 2021-09-08 DIAGNOSIS — Z992 Dependence on renal dialysis: Secondary | ICD-10-CM | POA: Diagnosis not present

## 2021-09-08 DIAGNOSIS — I129 Hypertensive chronic kidney disease with stage 1 through stage 4 chronic kidney disease, or unspecified chronic kidney disease: Secondary | ICD-10-CM | POA: Diagnosis not present

## 2021-09-08 DIAGNOSIS — N186 End stage renal disease: Secondary | ICD-10-CM | POA: Diagnosis not present

## 2021-09-08 DIAGNOSIS — C641 Malignant neoplasm of right kidney, except renal pelvis: Secondary | ICD-10-CM | POA: Diagnosis not present

## 2021-09-09 DIAGNOSIS — Z992 Dependence on renal dialysis: Secondary | ICD-10-CM | POA: Diagnosis not present

## 2021-09-09 DIAGNOSIS — R52 Pain, unspecified: Secondary | ICD-10-CM | POA: Diagnosis not present

## 2021-09-09 DIAGNOSIS — D509 Iron deficiency anemia, unspecified: Secondary | ICD-10-CM | POA: Diagnosis not present

## 2021-09-09 DIAGNOSIS — D631 Anemia in chronic kidney disease: Secondary | ICD-10-CM | POA: Diagnosis not present

## 2021-09-09 DIAGNOSIS — N2581 Secondary hyperparathyroidism of renal origin: Secondary | ICD-10-CM | POA: Diagnosis not present

## 2021-09-09 DIAGNOSIS — N186 End stage renal disease: Secondary | ICD-10-CM | POA: Diagnosis not present

## 2021-09-09 DIAGNOSIS — E876 Hypokalemia: Secondary | ICD-10-CM | POA: Diagnosis not present

## 2021-09-12 DIAGNOSIS — D509 Iron deficiency anemia, unspecified: Secondary | ICD-10-CM | POA: Diagnosis not present

## 2021-09-12 DIAGNOSIS — N186 End stage renal disease: Secondary | ICD-10-CM | POA: Diagnosis not present

## 2021-09-12 DIAGNOSIS — E876 Hypokalemia: Secondary | ICD-10-CM | POA: Diagnosis not present

## 2021-09-12 DIAGNOSIS — N2581 Secondary hyperparathyroidism of renal origin: Secondary | ICD-10-CM | POA: Diagnosis not present

## 2021-09-12 DIAGNOSIS — D631 Anemia in chronic kidney disease: Secondary | ICD-10-CM | POA: Diagnosis not present

## 2021-09-12 DIAGNOSIS — Z992 Dependence on renal dialysis: Secondary | ICD-10-CM | POA: Diagnosis not present

## 2021-09-12 DIAGNOSIS — R52 Pain, unspecified: Secondary | ICD-10-CM | POA: Diagnosis not present

## 2021-09-14 DIAGNOSIS — Z992 Dependence on renal dialysis: Secondary | ICD-10-CM | POA: Diagnosis not present

## 2021-09-14 DIAGNOSIS — E876 Hypokalemia: Secondary | ICD-10-CM | POA: Diagnosis not present

## 2021-09-14 DIAGNOSIS — R52 Pain, unspecified: Secondary | ICD-10-CM | POA: Diagnosis not present

## 2021-09-14 DIAGNOSIS — D631 Anemia in chronic kidney disease: Secondary | ICD-10-CM | POA: Diagnosis not present

## 2021-09-14 DIAGNOSIS — D509 Iron deficiency anemia, unspecified: Secondary | ICD-10-CM | POA: Diagnosis not present

## 2021-09-14 DIAGNOSIS — N186 End stage renal disease: Secondary | ICD-10-CM | POA: Diagnosis not present

## 2021-09-14 DIAGNOSIS — N2581 Secondary hyperparathyroidism of renal origin: Secondary | ICD-10-CM | POA: Diagnosis not present

## 2021-09-16 DIAGNOSIS — D631 Anemia in chronic kidney disease: Secondary | ICD-10-CM | POA: Diagnosis not present

## 2021-09-16 DIAGNOSIS — E876 Hypokalemia: Secondary | ICD-10-CM | POA: Diagnosis not present

## 2021-09-16 DIAGNOSIS — N186 End stage renal disease: Secondary | ICD-10-CM | POA: Diagnosis not present

## 2021-09-16 DIAGNOSIS — R52 Pain, unspecified: Secondary | ICD-10-CM | POA: Diagnosis not present

## 2021-09-16 DIAGNOSIS — D509 Iron deficiency anemia, unspecified: Secondary | ICD-10-CM | POA: Diagnosis not present

## 2021-09-16 DIAGNOSIS — Z992 Dependence on renal dialysis: Secondary | ICD-10-CM | POA: Diagnosis not present

## 2021-09-16 DIAGNOSIS — N2581 Secondary hyperparathyroidism of renal origin: Secondary | ICD-10-CM | POA: Diagnosis not present

## 2021-09-19 DIAGNOSIS — Z992 Dependence on renal dialysis: Secondary | ICD-10-CM | POA: Diagnosis not present

## 2021-09-19 DIAGNOSIS — E876 Hypokalemia: Secondary | ICD-10-CM | POA: Diagnosis not present

## 2021-09-19 DIAGNOSIS — N2581 Secondary hyperparathyroidism of renal origin: Secondary | ICD-10-CM | POA: Diagnosis not present

## 2021-09-19 DIAGNOSIS — N186 End stage renal disease: Secondary | ICD-10-CM | POA: Diagnosis not present

## 2021-09-19 DIAGNOSIS — D509 Iron deficiency anemia, unspecified: Secondary | ICD-10-CM | POA: Diagnosis not present

## 2021-09-19 DIAGNOSIS — D631 Anemia in chronic kidney disease: Secondary | ICD-10-CM | POA: Diagnosis not present

## 2021-09-19 DIAGNOSIS — R52 Pain, unspecified: Secondary | ICD-10-CM | POA: Diagnosis not present

## 2021-09-21 DIAGNOSIS — D509 Iron deficiency anemia, unspecified: Secondary | ICD-10-CM | POA: Diagnosis not present

## 2021-09-21 DIAGNOSIS — R52 Pain, unspecified: Secondary | ICD-10-CM | POA: Diagnosis not present

## 2021-09-21 DIAGNOSIS — N2581 Secondary hyperparathyroidism of renal origin: Secondary | ICD-10-CM | POA: Diagnosis not present

## 2021-09-21 DIAGNOSIS — D631 Anemia in chronic kidney disease: Secondary | ICD-10-CM | POA: Diagnosis not present

## 2021-09-21 DIAGNOSIS — E876 Hypokalemia: Secondary | ICD-10-CM | POA: Diagnosis not present

## 2021-09-21 DIAGNOSIS — N186 End stage renal disease: Secondary | ICD-10-CM | POA: Diagnosis not present

## 2021-09-21 DIAGNOSIS — Z992 Dependence on renal dialysis: Secondary | ICD-10-CM | POA: Diagnosis not present

## 2021-09-23 DIAGNOSIS — Z992 Dependence on renal dialysis: Secondary | ICD-10-CM | POA: Diagnosis not present

## 2021-09-23 DIAGNOSIS — D509 Iron deficiency anemia, unspecified: Secondary | ICD-10-CM | POA: Diagnosis not present

## 2021-09-23 DIAGNOSIS — N2581 Secondary hyperparathyroidism of renal origin: Secondary | ICD-10-CM | POA: Diagnosis not present

## 2021-09-23 DIAGNOSIS — E876 Hypokalemia: Secondary | ICD-10-CM | POA: Diagnosis not present

## 2021-09-23 DIAGNOSIS — N186 End stage renal disease: Secondary | ICD-10-CM | POA: Diagnosis not present

## 2021-09-23 DIAGNOSIS — D631 Anemia in chronic kidney disease: Secondary | ICD-10-CM | POA: Diagnosis not present

## 2021-09-23 DIAGNOSIS — R52 Pain, unspecified: Secondary | ICD-10-CM | POA: Diagnosis not present

## 2021-09-26 DIAGNOSIS — Z992 Dependence on renal dialysis: Secondary | ICD-10-CM | POA: Diagnosis not present

## 2021-09-26 DIAGNOSIS — E876 Hypokalemia: Secondary | ICD-10-CM | POA: Diagnosis not present

## 2021-09-26 DIAGNOSIS — R52 Pain, unspecified: Secondary | ICD-10-CM | POA: Diagnosis not present

## 2021-09-26 DIAGNOSIS — D509 Iron deficiency anemia, unspecified: Secondary | ICD-10-CM | POA: Diagnosis not present

## 2021-09-26 DIAGNOSIS — D631 Anemia in chronic kidney disease: Secondary | ICD-10-CM | POA: Diagnosis not present

## 2021-09-26 DIAGNOSIS — N2581 Secondary hyperparathyroidism of renal origin: Secondary | ICD-10-CM | POA: Diagnosis not present

## 2021-09-26 DIAGNOSIS — N186 End stage renal disease: Secondary | ICD-10-CM | POA: Diagnosis not present

## 2021-09-28 DIAGNOSIS — D509 Iron deficiency anemia, unspecified: Secondary | ICD-10-CM | POA: Diagnosis not present

## 2021-09-28 DIAGNOSIS — R52 Pain, unspecified: Secondary | ICD-10-CM | POA: Diagnosis not present

## 2021-09-28 DIAGNOSIS — N2581 Secondary hyperparathyroidism of renal origin: Secondary | ICD-10-CM | POA: Diagnosis not present

## 2021-09-28 DIAGNOSIS — D631 Anemia in chronic kidney disease: Secondary | ICD-10-CM | POA: Diagnosis not present

## 2021-09-28 DIAGNOSIS — N186 End stage renal disease: Secondary | ICD-10-CM | POA: Diagnosis not present

## 2021-09-28 DIAGNOSIS — Z992 Dependence on renal dialysis: Secondary | ICD-10-CM | POA: Diagnosis not present

## 2021-09-28 DIAGNOSIS — E876 Hypokalemia: Secondary | ICD-10-CM | POA: Diagnosis not present

## 2021-09-30 DIAGNOSIS — E876 Hypokalemia: Secondary | ICD-10-CM | POA: Diagnosis not present

## 2021-09-30 DIAGNOSIS — Z992 Dependence on renal dialysis: Secondary | ICD-10-CM | POA: Diagnosis not present

## 2021-09-30 DIAGNOSIS — D509 Iron deficiency anemia, unspecified: Secondary | ICD-10-CM | POA: Diagnosis not present

## 2021-09-30 DIAGNOSIS — D631 Anemia in chronic kidney disease: Secondary | ICD-10-CM | POA: Diagnosis not present

## 2021-09-30 DIAGNOSIS — N2581 Secondary hyperparathyroidism of renal origin: Secondary | ICD-10-CM | POA: Diagnosis not present

## 2021-09-30 DIAGNOSIS — R52 Pain, unspecified: Secondary | ICD-10-CM | POA: Diagnosis not present

## 2021-09-30 DIAGNOSIS — N186 End stage renal disease: Secondary | ICD-10-CM | POA: Diagnosis not present

## 2021-10-03 DIAGNOSIS — Z992 Dependence on renal dialysis: Secondary | ICD-10-CM | POA: Diagnosis not present

## 2021-10-03 DIAGNOSIS — E876 Hypokalemia: Secondary | ICD-10-CM | POA: Diagnosis not present

## 2021-10-03 DIAGNOSIS — D631 Anemia in chronic kidney disease: Secondary | ICD-10-CM | POA: Diagnosis not present

## 2021-10-03 DIAGNOSIS — R52 Pain, unspecified: Secondary | ICD-10-CM | POA: Diagnosis not present

## 2021-10-03 DIAGNOSIS — N2581 Secondary hyperparathyroidism of renal origin: Secondary | ICD-10-CM | POA: Diagnosis not present

## 2021-10-03 DIAGNOSIS — D509 Iron deficiency anemia, unspecified: Secondary | ICD-10-CM | POA: Diagnosis not present

## 2021-10-03 DIAGNOSIS — N186 End stage renal disease: Secondary | ICD-10-CM | POA: Diagnosis not present

## 2021-10-05 DIAGNOSIS — Z992 Dependence on renal dialysis: Secondary | ICD-10-CM | POA: Diagnosis not present

## 2021-10-05 DIAGNOSIS — N2581 Secondary hyperparathyroidism of renal origin: Secondary | ICD-10-CM | POA: Diagnosis not present

## 2021-10-05 DIAGNOSIS — R52 Pain, unspecified: Secondary | ICD-10-CM | POA: Diagnosis not present

## 2021-10-05 DIAGNOSIS — D631 Anemia in chronic kidney disease: Secondary | ICD-10-CM | POA: Diagnosis not present

## 2021-10-05 DIAGNOSIS — N186 End stage renal disease: Secondary | ICD-10-CM | POA: Diagnosis not present

## 2021-10-05 DIAGNOSIS — E876 Hypokalemia: Secondary | ICD-10-CM | POA: Diagnosis not present

## 2021-10-05 DIAGNOSIS — D509 Iron deficiency anemia, unspecified: Secondary | ICD-10-CM | POA: Diagnosis not present

## 2021-10-07 DIAGNOSIS — R52 Pain, unspecified: Secondary | ICD-10-CM | POA: Diagnosis not present

## 2021-10-07 DIAGNOSIS — N186 End stage renal disease: Secondary | ICD-10-CM | POA: Diagnosis not present

## 2021-10-07 DIAGNOSIS — Z992 Dependence on renal dialysis: Secondary | ICD-10-CM | POA: Diagnosis not present

## 2021-10-07 DIAGNOSIS — E876 Hypokalemia: Secondary | ICD-10-CM | POA: Diagnosis not present

## 2021-10-07 DIAGNOSIS — N2581 Secondary hyperparathyroidism of renal origin: Secondary | ICD-10-CM | POA: Diagnosis not present

## 2021-10-07 DIAGNOSIS — D509 Iron deficiency anemia, unspecified: Secondary | ICD-10-CM | POA: Diagnosis not present

## 2021-10-07 DIAGNOSIS — D631 Anemia in chronic kidney disease: Secondary | ICD-10-CM | POA: Diagnosis not present

## 2021-10-08 DIAGNOSIS — N186 End stage renal disease: Secondary | ICD-10-CM | POA: Diagnosis not present

## 2021-10-08 DIAGNOSIS — Z992 Dependence on renal dialysis: Secondary | ICD-10-CM | POA: Diagnosis not present

## 2021-10-08 DIAGNOSIS — I129 Hypertensive chronic kidney disease with stage 1 through stage 4 chronic kidney disease, or unspecified chronic kidney disease: Secondary | ICD-10-CM | POA: Diagnosis not present

## 2021-10-10 DIAGNOSIS — N2581 Secondary hyperparathyroidism of renal origin: Secondary | ICD-10-CM | POA: Diagnosis not present

## 2021-10-10 DIAGNOSIS — D631 Anemia in chronic kidney disease: Secondary | ICD-10-CM | POA: Diagnosis not present

## 2021-10-10 DIAGNOSIS — E876 Hypokalemia: Secondary | ICD-10-CM | POA: Diagnosis not present

## 2021-10-10 DIAGNOSIS — N186 End stage renal disease: Secondary | ICD-10-CM | POA: Diagnosis not present

## 2021-10-10 DIAGNOSIS — Z23 Encounter for immunization: Secondary | ICD-10-CM | POA: Diagnosis not present

## 2021-10-10 DIAGNOSIS — D509 Iron deficiency anemia, unspecified: Secondary | ICD-10-CM | POA: Diagnosis not present

## 2021-10-10 DIAGNOSIS — Z992 Dependence on renal dialysis: Secondary | ICD-10-CM | POA: Diagnosis not present

## 2021-10-12 DIAGNOSIS — Z23 Encounter for immunization: Secondary | ICD-10-CM | POA: Diagnosis not present

## 2021-10-12 DIAGNOSIS — E876 Hypokalemia: Secondary | ICD-10-CM | POA: Diagnosis not present

## 2021-10-12 DIAGNOSIS — D631 Anemia in chronic kidney disease: Secondary | ICD-10-CM | POA: Diagnosis not present

## 2021-10-12 DIAGNOSIS — D509 Iron deficiency anemia, unspecified: Secondary | ICD-10-CM | POA: Diagnosis not present

## 2021-10-12 DIAGNOSIS — Z992 Dependence on renal dialysis: Secondary | ICD-10-CM | POA: Diagnosis not present

## 2021-10-12 DIAGNOSIS — N2581 Secondary hyperparathyroidism of renal origin: Secondary | ICD-10-CM | POA: Diagnosis not present

## 2021-10-12 DIAGNOSIS — N186 End stage renal disease: Secondary | ICD-10-CM | POA: Diagnosis not present

## 2021-10-13 ENCOUNTER — Telehealth: Payer: Self-pay

## 2021-10-13 NOTE — Patient Outreach (Signed)
  Care Coordination   10/13/2021 Name: Paul Price MRN: [id number] DOB: 1940-08-18   Care Coordination Outreach Attempts:  An unsuccessful telephone outreach was attempted today to offer the patient information about available care coordination services as a benefit of their health plan.   Follow Up Plan:  Additional outreach attempts will be made to offer the patient care coordination information and services.   Encounter Outcome:  Pt. Request to Call Back  Care Coordination Interventions Activated:  No   Care Coordination Interventions:  No, not indicated    Jone Baseman, RN, MSN [facility] Care Management Coordinator Direct Line [phone]

## 2021-10-14 DIAGNOSIS — D509 Iron deficiency anemia, unspecified: Secondary | ICD-10-CM | POA: Diagnosis not present

## 2021-10-14 DIAGNOSIS — N186 End stage renal disease: Secondary | ICD-10-CM | POA: Diagnosis not present

## 2021-10-14 DIAGNOSIS — E876 Hypokalemia: Secondary | ICD-10-CM | POA: Diagnosis not present

## 2021-10-14 DIAGNOSIS — Z992 Dependence on renal dialysis: Secondary | ICD-10-CM | POA: Diagnosis not present

## 2021-10-14 DIAGNOSIS — D631 Anemia in chronic kidney disease: Secondary | ICD-10-CM | POA: Diagnosis not present

## 2021-10-14 DIAGNOSIS — N2581 Secondary hyperparathyroidism of renal origin: Secondary | ICD-10-CM | POA: Diagnosis not present

## 2021-10-14 DIAGNOSIS — Z23 Encounter for immunization: Secondary | ICD-10-CM | POA: Diagnosis not present

## 2021-10-17 DIAGNOSIS — N2581 Secondary hyperparathyroidism of renal origin: Secondary | ICD-10-CM | POA: Diagnosis not present

## 2021-10-17 DIAGNOSIS — N186 End stage renal disease: Secondary | ICD-10-CM | POA: Diagnosis not present

## 2021-10-17 DIAGNOSIS — Z992 Dependence on renal dialysis: Secondary | ICD-10-CM | POA: Diagnosis not present

## 2021-10-17 DIAGNOSIS — Z23 Encounter for immunization: Secondary | ICD-10-CM | POA: Diagnosis not present

## 2021-10-17 DIAGNOSIS — D509 Iron deficiency anemia, unspecified: Secondary | ICD-10-CM | POA: Diagnosis not present

## 2021-10-17 DIAGNOSIS — E876 Hypokalemia: Secondary | ICD-10-CM | POA: Diagnosis not present

## 2021-10-17 DIAGNOSIS — D631 Anemia in chronic kidney disease: Secondary | ICD-10-CM | POA: Diagnosis not present

## 2021-10-18 DIAGNOSIS — H6121 Impacted cerumen, right ear: Secondary | ICD-10-CM | POA: Diagnosis not present

## 2021-10-19 DIAGNOSIS — Z992 Dependence on renal dialysis: Secondary | ICD-10-CM | POA: Diagnosis not present

## 2021-10-19 DIAGNOSIS — D509 Iron deficiency anemia, unspecified: Secondary | ICD-10-CM | POA: Diagnosis not present

## 2021-10-19 DIAGNOSIS — E876 Hypokalemia: Secondary | ICD-10-CM | POA: Diagnosis not present

## 2021-10-19 DIAGNOSIS — Z23 Encounter for immunization: Secondary | ICD-10-CM | POA: Diagnosis not present

## 2021-10-19 DIAGNOSIS — N186 End stage renal disease: Secondary | ICD-10-CM | POA: Diagnosis not present

## 2021-10-19 DIAGNOSIS — D631 Anemia in chronic kidney disease: Secondary | ICD-10-CM | POA: Diagnosis not present

## 2021-10-19 DIAGNOSIS — N2581 Secondary hyperparathyroidism of renal origin: Secondary | ICD-10-CM | POA: Diagnosis not present

## 2021-10-21 DIAGNOSIS — D631 Anemia in chronic kidney disease: Secondary | ICD-10-CM | POA: Diagnosis not present

## 2021-10-21 DIAGNOSIS — N2581 Secondary hyperparathyroidism of renal origin: Secondary | ICD-10-CM | POA: Diagnosis not present

## 2021-10-21 DIAGNOSIS — Z992 Dependence on renal dialysis: Secondary | ICD-10-CM | POA: Diagnosis not present

## 2021-10-21 DIAGNOSIS — E876 Hypokalemia: Secondary | ICD-10-CM | POA: Diagnosis not present

## 2021-10-21 DIAGNOSIS — Z23 Encounter for immunization: Secondary | ICD-10-CM | POA: Diagnosis not present

## 2021-10-21 DIAGNOSIS — N186 End stage renal disease: Secondary | ICD-10-CM | POA: Diagnosis not present

## 2021-10-21 DIAGNOSIS — D509 Iron deficiency anemia, unspecified: Secondary | ICD-10-CM | POA: Diagnosis not present

## 2021-10-24 DIAGNOSIS — E876 Hypokalemia: Secondary | ICD-10-CM | POA: Diagnosis not present

## 2021-10-24 DIAGNOSIS — N186 End stage renal disease: Secondary | ICD-10-CM | POA: Diagnosis not present

## 2021-10-24 DIAGNOSIS — D509 Iron deficiency anemia, unspecified: Secondary | ICD-10-CM | POA: Diagnosis not present

## 2021-10-24 DIAGNOSIS — D631 Anemia in chronic kidney disease: Secondary | ICD-10-CM | POA: Diagnosis not present

## 2021-10-24 DIAGNOSIS — Z23 Encounter for immunization: Secondary | ICD-10-CM | POA: Diagnosis not present

## 2021-10-24 DIAGNOSIS — N2581 Secondary hyperparathyroidism of renal origin: Secondary | ICD-10-CM | POA: Diagnosis not present

## 2021-10-24 DIAGNOSIS — Z992 Dependence on renal dialysis: Secondary | ICD-10-CM | POA: Diagnosis not present

## 2021-10-26 DIAGNOSIS — E876 Hypokalemia: Secondary | ICD-10-CM | POA: Diagnosis not present

## 2021-10-26 DIAGNOSIS — Z992 Dependence on renal dialysis: Secondary | ICD-10-CM | POA: Diagnosis not present

## 2021-10-26 DIAGNOSIS — N186 End stage renal disease: Secondary | ICD-10-CM | POA: Diagnosis not present

## 2021-10-26 DIAGNOSIS — D631 Anemia in chronic kidney disease: Secondary | ICD-10-CM | POA: Diagnosis not present

## 2021-10-26 DIAGNOSIS — Z23 Encounter for immunization: Secondary | ICD-10-CM | POA: Diagnosis not present

## 2021-10-26 DIAGNOSIS — N2581 Secondary hyperparathyroidism of renal origin: Secondary | ICD-10-CM | POA: Diagnosis not present

## 2021-10-26 DIAGNOSIS — D509 Iron deficiency anemia, unspecified: Secondary | ICD-10-CM | POA: Diagnosis not present

## 2021-10-28 ENCOUNTER — Telehealth: Payer: Self-pay

## 2021-10-28 DIAGNOSIS — D631 Anemia in chronic kidney disease: Secondary | ICD-10-CM | POA: Diagnosis not present

## 2021-10-28 DIAGNOSIS — E876 Hypokalemia: Secondary | ICD-10-CM | POA: Diagnosis not present

## 2021-10-28 DIAGNOSIS — N186 End stage renal disease: Secondary | ICD-10-CM | POA: Diagnosis not present

## 2021-10-28 DIAGNOSIS — D509 Iron deficiency anemia, unspecified: Secondary | ICD-10-CM | POA: Diagnosis not present

## 2021-10-28 DIAGNOSIS — Z992 Dependence on renal dialysis: Secondary | ICD-10-CM | POA: Diagnosis not present

## 2021-10-28 DIAGNOSIS — N2581 Secondary hyperparathyroidism of renal origin: Secondary | ICD-10-CM | POA: Diagnosis not present

## 2021-10-28 DIAGNOSIS — Z23 Encounter for immunization: Secondary | ICD-10-CM | POA: Diagnosis not present

## 2021-10-28 NOTE — Patient Outreach (Signed)
  Care Coordination   10/28/2021 Name: Paul Price MRN: [id number] DOB: 06/20/1940   Care Coordination Outreach Attempts:  A second unsuccessful outreach was attempted today to offer the patient with information about available care coordination services as a benefit of their health plan.     Follow Up Plan:  Additional outreach attempts will be made to offer the patient care coordination information and services.   Encounter Outcome:  No Answer  Care Coordination Interventions Activated:  No   Care Coordination Interventions:  No, not indicated    Jone Baseman, RN, MSN [facility] Care Management Care Management Coordinator Direct Line [phone]

## 2021-10-31 DIAGNOSIS — N186 End stage renal disease: Secondary | ICD-10-CM | POA: Diagnosis not present

## 2021-10-31 DIAGNOSIS — Z23 Encounter for immunization: Secondary | ICD-10-CM | POA: Diagnosis not present

## 2021-10-31 DIAGNOSIS — E876 Hypokalemia: Secondary | ICD-10-CM | POA: Diagnosis not present

## 2021-10-31 DIAGNOSIS — D631 Anemia in chronic kidney disease: Secondary | ICD-10-CM | POA: Diagnosis not present

## 2021-10-31 DIAGNOSIS — Z992 Dependence on renal dialysis: Secondary | ICD-10-CM | POA: Diagnosis not present

## 2021-10-31 DIAGNOSIS — D509 Iron deficiency anemia, unspecified: Secondary | ICD-10-CM | POA: Diagnosis not present

## 2021-10-31 DIAGNOSIS — N2581 Secondary hyperparathyroidism of renal origin: Secondary | ICD-10-CM | POA: Diagnosis not present

## 2021-11-02 DIAGNOSIS — Z23 Encounter for immunization: Secondary | ICD-10-CM | POA: Diagnosis not present

## 2021-11-02 DIAGNOSIS — E876 Hypokalemia: Secondary | ICD-10-CM | POA: Diagnosis not present

## 2021-11-02 DIAGNOSIS — B029 Zoster without complications: Secondary | ICD-10-CM | POA: Diagnosis not present

## 2021-11-02 DIAGNOSIS — N2581 Secondary hyperparathyroidism of renal origin: Secondary | ICD-10-CM | POA: Diagnosis not present

## 2021-11-02 DIAGNOSIS — D631 Anemia in chronic kidney disease: Secondary | ICD-10-CM | POA: Diagnosis not present

## 2021-11-02 DIAGNOSIS — D509 Iron deficiency anemia, unspecified: Secondary | ICD-10-CM | POA: Diagnosis not present

## 2021-11-02 DIAGNOSIS — N186 End stage renal disease: Secondary | ICD-10-CM | POA: Diagnosis not present

## 2021-11-02 DIAGNOSIS — Z992 Dependence on renal dialysis: Secondary | ICD-10-CM | POA: Diagnosis not present

## 2021-11-04 DIAGNOSIS — Z23 Encounter for immunization: Secondary | ICD-10-CM | POA: Diagnosis not present

## 2021-11-04 DIAGNOSIS — Z992 Dependence on renal dialysis: Secondary | ICD-10-CM | POA: Diagnosis not present

## 2021-11-04 DIAGNOSIS — D509 Iron deficiency anemia, unspecified: Secondary | ICD-10-CM | POA: Diagnosis not present

## 2021-11-04 DIAGNOSIS — N186 End stage renal disease: Secondary | ICD-10-CM | POA: Diagnosis not present

## 2021-11-04 DIAGNOSIS — E876 Hypokalemia: Secondary | ICD-10-CM | POA: Diagnosis not present

## 2021-11-04 DIAGNOSIS — D631 Anemia in chronic kidney disease: Secondary | ICD-10-CM | POA: Diagnosis not present

## 2021-11-04 DIAGNOSIS — N2581 Secondary hyperparathyroidism of renal origin: Secondary | ICD-10-CM | POA: Diagnosis not present

## 2021-11-07 DIAGNOSIS — D509 Iron deficiency anemia, unspecified: Secondary | ICD-10-CM | POA: Diagnosis not present

## 2021-11-07 DIAGNOSIS — Z23 Encounter for immunization: Secondary | ICD-10-CM | POA: Diagnosis not present

## 2021-11-07 DIAGNOSIS — E876 Hypokalemia: Secondary | ICD-10-CM | POA: Diagnosis not present

## 2021-11-07 DIAGNOSIS — D631 Anemia in chronic kidney disease: Secondary | ICD-10-CM | POA: Diagnosis not present

## 2021-11-07 DIAGNOSIS — N186 End stage renal disease: Secondary | ICD-10-CM | POA: Diagnosis not present

## 2021-11-07 DIAGNOSIS — Z992 Dependence on renal dialysis: Secondary | ICD-10-CM | POA: Diagnosis not present

## 2021-11-07 DIAGNOSIS — N2581 Secondary hyperparathyroidism of renal origin: Secondary | ICD-10-CM | POA: Diagnosis not present

## 2021-11-08 DIAGNOSIS — N186 End stage renal disease: Secondary | ICD-10-CM | POA: Diagnosis not present

## 2021-11-08 DIAGNOSIS — I129 Hypertensive chronic kidney disease with stage 1 through stage 4 chronic kidney disease, or unspecified chronic kidney disease: Secondary | ICD-10-CM | POA: Diagnosis not present

## 2021-11-08 DIAGNOSIS — Z992 Dependence on renal dialysis: Secondary | ICD-10-CM | POA: Diagnosis not present

## 2021-11-09 DIAGNOSIS — N2581 Secondary hyperparathyroidism of renal origin: Secondary | ICD-10-CM | POA: Diagnosis not present

## 2021-11-09 DIAGNOSIS — H9190 Unspecified hearing loss, unspecified ear: Secondary | ICD-10-CM | POA: Diagnosis not present

## 2021-11-09 DIAGNOSIS — D631 Anemia in chronic kidney disease: Secondary | ICD-10-CM | POA: Diagnosis not present

## 2021-11-09 DIAGNOSIS — E876 Hypokalemia: Secondary | ICD-10-CM | POA: Diagnosis not present

## 2021-11-09 DIAGNOSIS — N186 End stage renal disease: Secondary | ICD-10-CM | POA: Diagnosis not present

## 2021-11-09 DIAGNOSIS — D509 Iron deficiency anemia, unspecified: Secondary | ICD-10-CM | POA: Diagnosis not present

## 2021-11-09 DIAGNOSIS — Z992 Dependence on renal dialysis: Secondary | ICD-10-CM | POA: Diagnosis not present

## 2021-11-10 DIAGNOSIS — H2513 Age-related nuclear cataract, bilateral: Secondary | ICD-10-CM | POA: Diagnosis not present

## 2021-11-11 DIAGNOSIS — D509 Iron deficiency anemia, unspecified: Secondary | ICD-10-CM | POA: Diagnosis not present

## 2021-11-11 DIAGNOSIS — E876 Hypokalemia: Secondary | ICD-10-CM | POA: Diagnosis not present

## 2021-11-11 DIAGNOSIS — Z992 Dependence on renal dialysis: Secondary | ICD-10-CM | POA: Diagnosis not present

## 2021-11-11 DIAGNOSIS — D631 Anemia in chronic kidney disease: Secondary | ICD-10-CM | POA: Diagnosis not present

## 2021-11-11 DIAGNOSIS — N186 End stage renal disease: Secondary | ICD-10-CM | POA: Diagnosis not present

## 2021-11-11 DIAGNOSIS — N2581 Secondary hyperparathyroidism of renal origin: Secondary | ICD-10-CM | POA: Diagnosis not present

## 2021-11-14 DIAGNOSIS — N2581 Secondary hyperparathyroidism of renal origin: Secondary | ICD-10-CM | POA: Diagnosis not present

## 2021-11-14 DIAGNOSIS — Z992 Dependence on renal dialysis: Secondary | ICD-10-CM | POA: Diagnosis not present

## 2021-11-14 DIAGNOSIS — N186 End stage renal disease: Secondary | ICD-10-CM | POA: Diagnosis not present

## 2021-11-14 DIAGNOSIS — D509 Iron deficiency anemia, unspecified: Secondary | ICD-10-CM | POA: Diagnosis not present

## 2021-11-14 DIAGNOSIS — D631 Anemia in chronic kidney disease: Secondary | ICD-10-CM | POA: Diagnosis not present

## 2021-11-14 DIAGNOSIS — E876 Hypokalemia: Secondary | ICD-10-CM | POA: Diagnosis not present

## 2021-11-15 ENCOUNTER — Telehealth: Payer: Self-pay

## 2021-11-15 NOTE — Patient Outreach (Signed)
  Care Coordination   11/15/2021 Name: Paul Price MRN: [id number] DOB: September 07, 1940   Care Coordination Outreach Attempts:  A third unsuccessful outreach was attempted today to offer the patient with information about available care coordination services as a benefit of their health plan.   Follow Up Plan:  No further outreach attempts will be made at this time. We have been unable to contact the patient to offer or enroll patient in care coordination services  Encounter Outcome:  Pt. Request to Call Back Patient states he will call CM back.  Care Coordination Interventions Activated:  No   Care Coordination Interventions:  No, not indicated    Jone Baseman, RN, MSN [facility] Care Management Care Management Coordinator Direct Line [phone]

## 2021-11-16 ENCOUNTER — Ambulatory Visit (INDEPENDENT_AMBULATORY_CARE_PROVIDER_SITE_OTHER): Payer: Medicare Other

## 2021-11-16 DIAGNOSIS — N186 End stage renal disease: Secondary | ICD-10-CM | POA: Diagnosis not present

## 2021-11-16 DIAGNOSIS — Z992 Dependence on renal dialysis: Secondary | ICD-10-CM | POA: Diagnosis not present

## 2021-11-16 DIAGNOSIS — I442 Atrioventricular block, complete: Secondary | ICD-10-CM

## 2021-11-16 DIAGNOSIS — N2581 Secondary hyperparathyroidism of renal origin: Secondary | ICD-10-CM | POA: Diagnosis not present

## 2021-11-16 DIAGNOSIS — D631 Anemia in chronic kidney disease: Secondary | ICD-10-CM | POA: Diagnosis not present

## 2021-11-16 DIAGNOSIS — D509 Iron deficiency anemia, unspecified: Secondary | ICD-10-CM | POA: Diagnosis not present

## 2021-11-16 DIAGNOSIS — E876 Hypokalemia: Secondary | ICD-10-CM | POA: Diagnosis not present

## 2021-11-17 LAB — CUP PACEART REMOTE DEVICE CHECK
Battery Remaining Longevity: 37 mo
Battery Voltage: 2.87 V
Brady Statistic AP VP Percent: 18.78 %
Brady Statistic AP VS Percent: 0.7 %
Brady Statistic AS VP Percent: 74.98 %
Brady Statistic AS VS Percent: 5.54 %
Brady Statistic RA Percent Paced: 20.48 %
Brady Statistic RV Percent Paced: 93.76 %
Date Time Interrogation Session: 20231108060807
Implantable Lead Connection Status: 753985
Implantable Lead Connection Status: 753985
Implantable Lead Implant Date: 20181217
Implantable Lead Implant Date: 20181217
Implantable Lead Location: 753859
Implantable Lead Location: 753859
Implantable Lead Model: 3830
Implantable Lead Model: 5076
Implantable Pulse Generator Implant Date: 20181217
Lead Channel Impedance Value: 304 Ohm
Lead Channel Impedance Value: 342 Ohm
Lead Channel Impedance Value: 418 Ohm
Lead Channel Impedance Value: 475 Ohm
Lead Channel Pacing Threshold Amplitude: 0.5 V
Lead Channel Pacing Threshold Amplitude: 0.75 V
Lead Channel Pacing Threshold Pulse Width: 0.4 ms
Lead Channel Pacing Threshold Pulse Width: 0.4 ms
Lead Channel Sensing Intrinsic Amplitude: 1.375 mV
Lead Channel Sensing Intrinsic Amplitude: 1.375 mV
Lead Channel Sensing Intrinsic Amplitude: 11 mV
Lead Channel Sensing Intrinsic Amplitude: 11 mV
Lead Channel Setting Pacing Amplitude: 2 V
Lead Channel Setting Pacing Amplitude: 2.5 V
Lead Channel Setting Pacing Pulse Width: 1 ms
Lead Channel Setting Sensing Sensitivity: 1.2 mV
Zone Setting Status: 755011
Zone Setting Status: 755011

## 2021-11-18 DIAGNOSIS — E876 Hypokalemia: Secondary | ICD-10-CM | POA: Diagnosis not present

## 2021-11-18 DIAGNOSIS — D509 Iron deficiency anemia, unspecified: Secondary | ICD-10-CM | POA: Diagnosis not present

## 2021-11-18 DIAGNOSIS — N2581 Secondary hyperparathyroidism of renal origin: Secondary | ICD-10-CM | POA: Diagnosis not present

## 2021-11-18 DIAGNOSIS — D631 Anemia in chronic kidney disease: Secondary | ICD-10-CM | POA: Diagnosis not present

## 2021-11-18 DIAGNOSIS — Z992 Dependence on renal dialysis: Secondary | ICD-10-CM | POA: Diagnosis not present

## 2021-11-18 DIAGNOSIS — N186 End stage renal disease: Secondary | ICD-10-CM | POA: Diagnosis not present

## 2021-11-21 DIAGNOSIS — E876 Hypokalemia: Secondary | ICD-10-CM | POA: Diagnosis not present

## 2021-11-21 DIAGNOSIS — D509 Iron deficiency anemia, unspecified: Secondary | ICD-10-CM | POA: Diagnosis not present

## 2021-11-21 DIAGNOSIS — N2581 Secondary hyperparathyroidism of renal origin: Secondary | ICD-10-CM | POA: Diagnosis not present

## 2021-11-21 DIAGNOSIS — D631 Anemia in chronic kidney disease: Secondary | ICD-10-CM | POA: Diagnosis not present

## 2021-11-21 DIAGNOSIS — N186 End stage renal disease: Secondary | ICD-10-CM | POA: Diagnosis not present

## 2021-11-21 DIAGNOSIS — Z992 Dependence on renal dialysis: Secondary | ICD-10-CM | POA: Diagnosis not present

## 2021-11-23 DIAGNOSIS — N186 End stage renal disease: Secondary | ICD-10-CM | POA: Diagnosis not present

## 2021-11-23 DIAGNOSIS — D631 Anemia in chronic kidney disease: Secondary | ICD-10-CM | POA: Diagnosis not present

## 2021-11-23 DIAGNOSIS — E876 Hypokalemia: Secondary | ICD-10-CM | POA: Diagnosis not present

## 2021-11-23 DIAGNOSIS — D509 Iron deficiency anemia, unspecified: Secondary | ICD-10-CM | POA: Diagnosis not present

## 2021-11-23 DIAGNOSIS — Z992 Dependence on renal dialysis: Secondary | ICD-10-CM | POA: Diagnosis not present

## 2021-11-23 DIAGNOSIS — N2581 Secondary hyperparathyroidism of renal origin: Secondary | ICD-10-CM | POA: Diagnosis not present

## 2021-11-25 DIAGNOSIS — Z992 Dependence on renal dialysis: Secondary | ICD-10-CM | POA: Diagnosis not present

## 2021-11-25 DIAGNOSIS — N2581 Secondary hyperparathyroidism of renal origin: Secondary | ICD-10-CM | POA: Diagnosis not present

## 2021-11-25 DIAGNOSIS — D631 Anemia in chronic kidney disease: Secondary | ICD-10-CM | POA: Diagnosis not present

## 2021-11-25 DIAGNOSIS — N186 End stage renal disease: Secondary | ICD-10-CM | POA: Diagnosis not present

## 2021-11-25 DIAGNOSIS — D509 Iron deficiency anemia, unspecified: Secondary | ICD-10-CM | POA: Diagnosis not present

## 2021-11-25 DIAGNOSIS — E876 Hypokalemia: Secondary | ICD-10-CM | POA: Diagnosis not present

## 2021-11-27 DIAGNOSIS — E876 Hypokalemia: Secondary | ICD-10-CM | POA: Diagnosis not present

## 2021-11-27 DIAGNOSIS — D631 Anemia in chronic kidney disease: Secondary | ICD-10-CM | POA: Diagnosis not present

## 2021-11-27 DIAGNOSIS — N186 End stage renal disease: Secondary | ICD-10-CM | POA: Diagnosis not present

## 2021-11-27 DIAGNOSIS — N2581 Secondary hyperparathyroidism of renal origin: Secondary | ICD-10-CM | POA: Diagnosis not present

## 2021-11-27 DIAGNOSIS — D509 Iron deficiency anemia, unspecified: Secondary | ICD-10-CM | POA: Diagnosis not present

## 2021-11-27 DIAGNOSIS — Z992 Dependence on renal dialysis: Secondary | ICD-10-CM | POA: Diagnosis not present

## 2021-11-29 DIAGNOSIS — Z992 Dependence on renal dialysis: Secondary | ICD-10-CM | POA: Diagnosis not present

## 2021-11-29 DIAGNOSIS — D631 Anemia in chronic kidney disease: Secondary | ICD-10-CM | POA: Diagnosis not present

## 2021-11-29 DIAGNOSIS — D509 Iron deficiency anemia, unspecified: Secondary | ICD-10-CM | POA: Diagnosis not present

## 2021-11-29 DIAGNOSIS — E876 Hypokalemia: Secondary | ICD-10-CM | POA: Diagnosis not present

## 2021-11-29 DIAGNOSIS — N2581 Secondary hyperparathyroidism of renal origin: Secondary | ICD-10-CM | POA: Diagnosis not present

## 2021-11-29 DIAGNOSIS — N186 End stage renal disease: Secondary | ICD-10-CM | POA: Diagnosis not present

## 2021-11-30 NOTE — Progress Notes (Signed)
Remote pacemaker transmission.   

## 2021-12-02 DIAGNOSIS — N186 End stage renal disease: Secondary | ICD-10-CM | POA: Diagnosis not present

## 2021-12-02 DIAGNOSIS — D509 Iron deficiency anemia, unspecified: Secondary | ICD-10-CM | POA: Diagnosis not present

## 2021-12-02 DIAGNOSIS — E876 Hypokalemia: Secondary | ICD-10-CM | POA: Diagnosis not present

## 2021-12-02 DIAGNOSIS — Z992 Dependence on renal dialysis: Secondary | ICD-10-CM | POA: Diagnosis not present

## 2021-12-02 DIAGNOSIS — N2581 Secondary hyperparathyroidism of renal origin: Secondary | ICD-10-CM | POA: Diagnosis not present

## 2021-12-02 DIAGNOSIS — D631 Anemia in chronic kidney disease: Secondary | ICD-10-CM | POA: Diagnosis not present

## 2021-12-05 DIAGNOSIS — N186 End stage renal disease: Secondary | ICD-10-CM | POA: Diagnosis not present

## 2021-12-05 DIAGNOSIS — N2581 Secondary hyperparathyroidism of renal origin: Secondary | ICD-10-CM | POA: Diagnosis not present

## 2021-12-05 DIAGNOSIS — D509 Iron deficiency anemia, unspecified: Secondary | ICD-10-CM | POA: Diagnosis not present

## 2021-12-05 DIAGNOSIS — D631 Anemia in chronic kidney disease: Secondary | ICD-10-CM | POA: Diagnosis not present

## 2021-12-05 DIAGNOSIS — E876 Hypokalemia: Secondary | ICD-10-CM | POA: Diagnosis not present

## 2021-12-05 DIAGNOSIS — Z992 Dependence on renal dialysis: Secondary | ICD-10-CM | POA: Diagnosis not present

## 2021-12-07 DIAGNOSIS — E876 Hypokalemia: Secondary | ICD-10-CM | POA: Diagnosis not present

## 2021-12-07 DIAGNOSIS — Z992 Dependence on renal dialysis: Secondary | ICD-10-CM | POA: Diagnosis not present

## 2021-12-07 DIAGNOSIS — D509 Iron deficiency anemia, unspecified: Secondary | ICD-10-CM | POA: Diagnosis not present

## 2021-12-07 DIAGNOSIS — N2581 Secondary hyperparathyroidism of renal origin: Secondary | ICD-10-CM | POA: Diagnosis not present

## 2021-12-07 DIAGNOSIS — N186 End stage renal disease: Secondary | ICD-10-CM | POA: Diagnosis not present

## 2021-12-07 DIAGNOSIS — D631 Anemia in chronic kidney disease: Secondary | ICD-10-CM | POA: Diagnosis not present

## 2021-12-08 DIAGNOSIS — Z992 Dependence on renal dialysis: Secondary | ICD-10-CM | POA: Diagnosis not present

## 2021-12-08 DIAGNOSIS — I129 Hypertensive chronic kidney disease with stage 1 through stage 4 chronic kidney disease, or unspecified chronic kidney disease: Secondary | ICD-10-CM | POA: Diagnosis not present

## 2021-12-08 DIAGNOSIS — N186 End stage renal disease: Secondary | ICD-10-CM | POA: Diagnosis not present

## 2021-12-09 DIAGNOSIS — D509 Iron deficiency anemia, unspecified: Secondary | ICD-10-CM | POA: Diagnosis not present

## 2021-12-09 DIAGNOSIS — N2581 Secondary hyperparathyroidism of renal origin: Secondary | ICD-10-CM | POA: Diagnosis not present

## 2021-12-09 DIAGNOSIS — D631 Anemia in chronic kidney disease: Secondary | ICD-10-CM | POA: Diagnosis not present

## 2021-12-09 DIAGNOSIS — Z992 Dependence on renal dialysis: Secondary | ICD-10-CM | POA: Diagnosis not present

## 2021-12-09 DIAGNOSIS — N186 End stage renal disease: Secondary | ICD-10-CM | POA: Diagnosis not present

## 2021-12-09 DIAGNOSIS — E876 Hypokalemia: Secondary | ICD-10-CM | POA: Diagnosis not present

## 2021-12-12 DIAGNOSIS — N2581 Secondary hyperparathyroidism of renal origin: Secondary | ICD-10-CM | POA: Diagnosis not present

## 2021-12-12 DIAGNOSIS — E876 Hypokalemia: Secondary | ICD-10-CM | POA: Diagnosis not present

## 2021-12-12 DIAGNOSIS — N186 End stage renal disease: Secondary | ICD-10-CM | POA: Diagnosis not present

## 2021-12-12 DIAGNOSIS — D631 Anemia in chronic kidney disease: Secondary | ICD-10-CM | POA: Diagnosis not present

## 2021-12-12 DIAGNOSIS — Z992 Dependence on renal dialysis: Secondary | ICD-10-CM | POA: Diagnosis not present

## 2021-12-12 DIAGNOSIS — D509 Iron deficiency anemia, unspecified: Secondary | ICD-10-CM | POA: Diagnosis not present

## 2021-12-14 DIAGNOSIS — N2581 Secondary hyperparathyroidism of renal origin: Secondary | ICD-10-CM | POA: Diagnosis not present

## 2021-12-14 DIAGNOSIS — H2512 Age-related nuclear cataract, left eye: Secondary | ICD-10-CM | POA: Diagnosis not present

## 2021-12-14 DIAGNOSIS — D631 Anemia in chronic kidney disease: Secondary | ICD-10-CM | POA: Diagnosis not present

## 2021-12-14 DIAGNOSIS — H268 Other specified cataract: Secondary | ICD-10-CM | POA: Diagnosis not present

## 2021-12-14 DIAGNOSIS — N186 End stage renal disease: Secondary | ICD-10-CM | POA: Diagnosis not present

## 2021-12-14 DIAGNOSIS — H269 Unspecified cataract: Secondary | ICD-10-CM | POA: Diagnosis not present

## 2021-12-14 DIAGNOSIS — D509 Iron deficiency anemia, unspecified: Secondary | ICD-10-CM | POA: Diagnosis not present

## 2021-12-14 DIAGNOSIS — E876 Hypokalemia: Secondary | ICD-10-CM | POA: Diagnosis not present

## 2021-12-14 DIAGNOSIS — Z992 Dependence on renal dialysis: Secondary | ICD-10-CM | POA: Diagnosis not present

## 2021-12-16 DIAGNOSIS — D631 Anemia in chronic kidney disease: Secondary | ICD-10-CM | POA: Diagnosis not present

## 2021-12-16 DIAGNOSIS — D509 Iron deficiency anemia, unspecified: Secondary | ICD-10-CM | POA: Diagnosis not present

## 2021-12-16 DIAGNOSIS — N2581 Secondary hyperparathyroidism of renal origin: Secondary | ICD-10-CM | POA: Diagnosis not present

## 2021-12-16 DIAGNOSIS — Z992 Dependence on renal dialysis: Secondary | ICD-10-CM | POA: Diagnosis not present

## 2021-12-16 DIAGNOSIS — E876 Hypokalemia: Secondary | ICD-10-CM | POA: Diagnosis not present

## 2021-12-16 DIAGNOSIS — N186 End stage renal disease: Secondary | ICD-10-CM | POA: Diagnosis not present

## 2021-12-19 DIAGNOSIS — Z992 Dependence on renal dialysis: Secondary | ICD-10-CM | POA: Diagnosis not present

## 2021-12-19 DIAGNOSIS — N2581 Secondary hyperparathyroidism of renal origin: Secondary | ICD-10-CM | POA: Diagnosis not present

## 2021-12-19 DIAGNOSIS — N186 End stage renal disease: Secondary | ICD-10-CM | POA: Diagnosis not present

## 2021-12-19 DIAGNOSIS — D631 Anemia in chronic kidney disease: Secondary | ICD-10-CM | POA: Diagnosis not present

## 2021-12-19 DIAGNOSIS — E876 Hypokalemia: Secondary | ICD-10-CM | POA: Diagnosis not present

## 2021-12-19 DIAGNOSIS — D509 Iron deficiency anemia, unspecified: Secondary | ICD-10-CM | POA: Diagnosis not present

## 2021-12-21 DIAGNOSIS — D631 Anemia in chronic kidney disease: Secondary | ICD-10-CM | POA: Diagnosis not present

## 2021-12-21 DIAGNOSIS — E876 Hypokalemia: Secondary | ICD-10-CM | POA: Diagnosis not present

## 2021-12-21 DIAGNOSIS — D509 Iron deficiency anemia, unspecified: Secondary | ICD-10-CM | POA: Diagnosis not present

## 2021-12-21 DIAGNOSIS — Z992 Dependence on renal dialysis: Secondary | ICD-10-CM | POA: Diagnosis not present

## 2021-12-21 DIAGNOSIS — N2581 Secondary hyperparathyroidism of renal origin: Secondary | ICD-10-CM | POA: Diagnosis not present

## 2021-12-21 DIAGNOSIS — N186 End stage renal disease: Secondary | ICD-10-CM | POA: Diagnosis not present

## 2021-12-23 DIAGNOSIS — N186 End stage renal disease: Secondary | ICD-10-CM | POA: Diagnosis not present

## 2021-12-23 DIAGNOSIS — Z992 Dependence on renal dialysis: Secondary | ICD-10-CM | POA: Diagnosis not present

## 2021-12-23 DIAGNOSIS — D631 Anemia in chronic kidney disease: Secondary | ICD-10-CM | POA: Diagnosis not present

## 2021-12-23 DIAGNOSIS — D509 Iron deficiency anemia, unspecified: Secondary | ICD-10-CM | POA: Diagnosis not present

## 2021-12-23 DIAGNOSIS — N2581 Secondary hyperparathyroidism of renal origin: Secondary | ICD-10-CM | POA: Diagnosis not present

## 2021-12-23 DIAGNOSIS — E876 Hypokalemia: Secondary | ICD-10-CM | POA: Diagnosis not present

## 2021-12-26 DIAGNOSIS — D509 Iron deficiency anemia, unspecified: Secondary | ICD-10-CM | POA: Diagnosis not present

## 2021-12-26 DIAGNOSIS — E876 Hypokalemia: Secondary | ICD-10-CM | POA: Diagnosis not present

## 2021-12-26 DIAGNOSIS — N2581 Secondary hyperparathyroidism of renal origin: Secondary | ICD-10-CM | POA: Diagnosis not present

## 2021-12-26 DIAGNOSIS — D631 Anemia in chronic kidney disease: Secondary | ICD-10-CM | POA: Diagnosis not present

## 2021-12-26 DIAGNOSIS — Z992 Dependence on renal dialysis: Secondary | ICD-10-CM | POA: Diagnosis not present

## 2021-12-26 DIAGNOSIS — N186 End stage renal disease: Secondary | ICD-10-CM | POA: Diagnosis not present

## 2021-12-28 DIAGNOSIS — D509 Iron deficiency anemia, unspecified: Secondary | ICD-10-CM | POA: Diagnosis not present

## 2021-12-28 DIAGNOSIS — Z992 Dependence on renal dialysis: Secondary | ICD-10-CM | POA: Diagnosis not present

## 2021-12-28 DIAGNOSIS — N186 End stage renal disease: Secondary | ICD-10-CM | POA: Diagnosis not present

## 2021-12-28 DIAGNOSIS — N2581 Secondary hyperparathyroidism of renal origin: Secondary | ICD-10-CM | POA: Diagnosis not present

## 2021-12-28 DIAGNOSIS — D631 Anemia in chronic kidney disease: Secondary | ICD-10-CM | POA: Diagnosis not present

## 2021-12-28 DIAGNOSIS — E876 Hypokalemia: Secondary | ICD-10-CM | POA: Diagnosis not present

## 2021-12-30 DIAGNOSIS — N186 End stage renal disease: Secondary | ICD-10-CM | POA: Diagnosis not present

## 2021-12-30 DIAGNOSIS — D509 Iron deficiency anemia, unspecified: Secondary | ICD-10-CM | POA: Diagnosis not present

## 2021-12-30 DIAGNOSIS — Z992 Dependence on renal dialysis: Secondary | ICD-10-CM | POA: Diagnosis not present

## 2021-12-30 DIAGNOSIS — D631 Anemia in chronic kidney disease: Secondary | ICD-10-CM | POA: Diagnosis not present

## 2021-12-30 DIAGNOSIS — N2581 Secondary hyperparathyroidism of renal origin: Secondary | ICD-10-CM | POA: Diagnosis not present

## 2021-12-30 DIAGNOSIS — E876 Hypokalemia: Secondary | ICD-10-CM | POA: Diagnosis not present

## 2022-01-01 DIAGNOSIS — E876 Hypokalemia: Secondary | ICD-10-CM | POA: Diagnosis not present

## 2022-01-01 DIAGNOSIS — N2581 Secondary hyperparathyroidism of renal origin: Secondary | ICD-10-CM | POA: Diagnosis not present

## 2022-01-01 DIAGNOSIS — N186 End stage renal disease: Secondary | ICD-10-CM | POA: Diagnosis not present

## 2022-01-01 DIAGNOSIS — D631 Anemia in chronic kidney disease: Secondary | ICD-10-CM | POA: Diagnosis not present

## 2022-01-01 DIAGNOSIS — Z992 Dependence on renal dialysis: Secondary | ICD-10-CM | POA: Diagnosis not present

## 2022-01-01 DIAGNOSIS — D509 Iron deficiency anemia, unspecified: Secondary | ICD-10-CM | POA: Diagnosis not present

## 2022-01-03 ENCOUNTER — Other Ambulatory Visit: Payer: Self-pay | Admitting: Cardiovascular Disease

## 2022-01-04 DIAGNOSIS — N2581 Secondary hyperparathyroidism of renal origin: Secondary | ICD-10-CM | POA: Diagnosis not present

## 2022-01-04 DIAGNOSIS — Z992 Dependence on renal dialysis: Secondary | ICD-10-CM | POA: Diagnosis not present

## 2022-01-04 DIAGNOSIS — E876 Hypokalemia: Secondary | ICD-10-CM | POA: Diagnosis not present

## 2022-01-04 DIAGNOSIS — D509 Iron deficiency anemia, unspecified: Secondary | ICD-10-CM | POA: Diagnosis not present

## 2022-01-04 DIAGNOSIS — D631 Anemia in chronic kidney disease: Secondary | ICD-10-CM | POA: Diagnosis not present

## 2022-01-04 DIAGNOSIS — N186 End stage renal disease: Secondary | ICD-10-CM | POA: Diagnosis not present

## 2022-01-05 DIAGNOSIS — Z992 Dependence on renal dialysis: Secondary | ICD-10-CM | POA: Diagnosis not present

## 2022-01-05 DIAGNOSIS — I871 Compression of vein: Secondary | ICD-10-CM | POA: Diagnosis not present

## 2022-01-05 DIAGNOSIS — N186 End stage renal disease: Secondary | ICD-10-CM | POA: Diagnosis not present

## 2022-01-06 DIAGNOSIS — D509 Iron deficiency anemia, unspecified: Secondary | ICD-10-CM | POA: Diagnosis not present

## 2022-01-06 DIAGNOSIS — N186 End stage renal disease: Secondary | ICD-10-CM | POA: Diagnosis not present

## 2022-01-06 DIAGNOSIS — N2581 Secondary hyperparathyroidism of renal origin: Secondary | ICD-10-CM | POA: Diagnosis not present

## 2022-01-06 DIAGNOSIS — Z992 Dependence on renal dialysis: Secondary | ICD-10-CM | POA: Diagnosis not present

## 2022-01-06 DIAGNOSIS — E876 Hypokalemia: Secondary | ICD-10-CM | POA: Diagnosis not present

## 2022-01-06 DIAGNOSIS — D631 Anemia in chronic kidney disease: Secondary | ICD-10-CM | POA: Diagnosis not present

## 2022-01-08 DIAGNOSIS — Z992 Dependence on renal dialysis: Secondary | ICD-10-CM | POA: Diagnosis not present

## 2022-01-08 DIAGNOSIS — N186 End stage renal disease: Secondary | ICD-10-CM | POA: Diagnosis not present

## 2022-01-08 DIAGNOSIS — I129 Hypertensive chronic kidney disease with stage 1 through stage 4 chronic kidney disease, or unspecified chronic kidney disease: Secondary | ICD-10-CM | POA: Diagnosis not present

## 2022-01-08 DIAGNOSIS — D631 Anemia in chronic kidney disease: Secondary | ICD-10-CM | POA: Diagnosis not present

## 2022-01-08 DIAGNOSIS — E876 Hypokalemia: Secondary | ICD-10-CM | POA: Diagnosis not present

## 2022-01-08 DIAGNOSIS — N2581 Secondary hyperparathyroidism of renal origin: Secondary | ICD-10-CM | POA: Diagnosis not present

## 2022-01-08 DIAGNOSIS — D509 Iron deficiency anemia, unspecified: Secondary | ICD-10-CM | POA: Diagnosis not present

## 2022-01-11 DIAGNOSIS — D509 Iron deficiency anemia, unspecified: Secondary | ICD-10-CM | POA: Diagnosis not present

## 2022-01-11 DIAGNOSIS — Z992 Dependence on renal dialysis: Secondary | ICD-10-CM | POA: Diagnosis not present

## 2022-01-11 DIAGNOSIS — N186 End stage renal disease: Secondary | ICD-10-CM | POA: Diagnosis not present

## 2022-01-11 DIAGNOSIS — D631 Anemia in chronic kidney disease: Secondary | ICD-10-CM | POA: Diagnosis not present

## 2022-01-11 DIAGNOSIS — E876 Hypokalemia: Secondary | ICD-10-CM | POA: Diagnosis not present

## 2022-01-11 DIAGNOSIS — N2581 Secondary hyperparathyroidism of renal origin: Secondary | ICD-10-CM | POA: Diagnosis not present

## 2022-01-13 DIAGNOSIS — N2581 Secondary hyperparathyroidism of renal origin: Secondary | ICD-10-CM | POA: Diagnosis not present

## 2022-01-13 DIAGNOSIS — D509 Iron deficiency anemia, unspecified: Secondary | ICD-10-CM | POA: Diagnosis not present

## 2022-01-13 DIAGNOSIS — D631 Anemia in chronic kidney disease: Secondary | ICD-10-CM | POA: Diagnosis not present

## 2022-01-13 DIAGNOSIS — N186 End stage renal disease: Secondary | ICD-10-CM | POA: Diagnosis not present

## 2022-01-13 DIAGNOSIS — E876 Hypokalemia: Secondary | ICD-10-CM | POA: Diagnosis not present

## 2022-01-13 DIAGNOSIS — Z992 Dependence on renal dialysis: Secondary | ICD-10-CM | POA: Diagnosis not present

## 2022-01-16 DIAGNOSIS — D631 Anemia in chronic kidney disease: Secondary | ICD-10-CM | POA: Diagnosis not present

## 2022-01-16 DIAGNOSIS — N2581 Secondary hyperparathyroidism of renal origin: Secondary | ICD-10-CM | POA: Diagnosis not present

## 2022-01-16 DIAGNOSIS — E876 Hypokalemia: Secondary | ICD-10-CM | POA: Diagnosis not present

## 2022-01-16 DIAGNOSIS — D509 Iron deficiency anemia, unspecified: Secondary | ICD-10-CM | POA: Diagnosis not present

## 2022-01-16 DIAGNOSIS — Z992 Dependence on renal dialysis: Secondary | ICD-10-CM | POA: Diagnosis not present

## 2022-01-16 DIAGNOSIS — N186 End stage renal disease: Secondary | ICD-10-CM | POA: Diagnosis not present

## 2022-01-18 DIAGNOSIS — D509 Iron deficiency anemia, unspecified: Secondary | ICD-10-CM | POA: Diagnosis not present

## 2022-01-18 DIAGNOSIS — Z992 Dependence on renal dialysis: Secondary | ICD-10-CM | POA: Diagnosis not present

## 2022-01-18 DIAGNOSIS — E876 Hypokalemia: Secondary | ICD-10-CM | POA: Diagnosis not present

## 2022-01-18 DIAGNOSIS — D631 Anemia in chronic kidney disease: Secondary | ICD-10-CM | POA: Diagnosis not present

## 2022-01-18 DIAGNOSIS — N186 End stage renal disease: Secondary | ICD-10-CM | POA: Diagnosis not present

## 2022-01-18 DIAGNOSIS — N2581 Secondary hyperparathyroidism of renal origin: Secondary | ICD-10-CM | POA: Diagnosis not present

## 2022-01-20 DIAGNOSIS — D631 Anemia in chronic kidney disease: Secondary | ICD-10-CM | POA: Diagnosis not present

## 2022-01-20 DIAGNOSIS — D509 Iron deficiency anemia, unspecified: Secondary | ICD-10-CM | POA: Diagnosis not present

## 2022-01-20 DIAGNOSIS — Z992 Dependence on renal dialysis: Secondary | ICD-10-CM | POA: Diagnosis not present

## 2022-01-20 DIAGNOSIS — N186 End stage renal disease: Secondary | ICD-10-CM | POA: Diagnosis not present

## 2022-01-20 DIAGNOSIS — N2581 Secondary hyperparathyroidism of renal origin: Secondary | ICD-10-CM | POA: Diagnosis not present

## 2022-01-20 DIAGNOSIS — E876 Hypokalemia: Secondary | ICD-10-CM | POA: Diagnosis not present

## 2022-01-23 DIAGNOSIS — N2581 Secondary hyperparathyroidism of renal origin: Secondary | ICD-10-CM | POA: Diagnosis not present

## 2022-01-23 DIAGNOSIS — Z992 Dependence on renal dialysis: Secondary | ICD-10-CM | POA: Diagnosis not present

## 2022-01-23 DIAGNOSIS — D509 Iron deficiency anemia, unspecified: Secondary | ICD-10-CM | POA: Diagnosis not present

## 2022-01-23 DIAGNOSIS — D631 Anemia in chronic kidney disease: Secondary | ICD-10-CM | POA: Diagnosis not present

## 2022-01-23 DIAGNOSIS — E876 Hypokalemia: Secondary | ICD-10-CM | POA: Diagnosis not present

## 2022-01-23 DIAGNOSIS — N186 End stage renal disease: Secondary | ICD-10-CM | POA: Diagnosis not present

## 2022-01-25 DIAGNOSIS — E876 Hypokalemia: Secondary | ICD-10-CM | POA: Diagnosis not present

## 2022-01-25 DIAGNOSIS — D631 Anemia in chronic kidney disease: Secondary | ICD-10-CM | POA: Diagnosis not present

## 2022-01-25 DIAGNOSIS — N2581 Secondary hyperparathyroidism of renal origin: Secondary | ICD-10-CM | POA: Diagnosis not present

## 2022-01-25 DIAGNOSIS — N186 End stage renal disease: Secondary | ICD-10-CM | POA: Diagnosis not present

## 2022-01-25 DIAGNOSIS — Z992 Dependence on renal dialysis: Secondary | ICD-10-CM | POA: Diagnosis not present

## 2022-01-25 DIAGNOSIS — D509 Iron deficiency anemia, unspecified: Secondary | ICD-10-CM | POA: Diagnosis not present

## 2022-01-27 DIAGNOSIS — E876 Hypokalemia: Secondary | ICD-10-CM | POA: Diagnosis not present

## 2022-01-27 DIAGNOSIS — N186 End stage renal disease: Secondary | ICD-10-CM | POA: Diagnosis not present

## 2022-01-27 DIAGNOSIS — N2581 Secondary hyperparathyroidism of renal origin: Secondary | ICD-10-CM | POA: Diagnosis not present

## 2022-01-27 DIAGNOSIS — D509 Iron deficiency anemia, unspecified: Secondary | ICD-10-CM | POA: Diagnosis not present

## 2022-01-27 DIAGNOSIS — D631 Anemia in chronic kidney disease: Secondary | ICD-10-CM | POA: Diagnosis not present

## 2022-01-27 DIAGNOSIS — Z992 Dependence on renal dialysis: Secondary | ICD-10-CM | POA: Diagnosis not present

## 2022-01-27 IMAGING — CR DG CHEST 2V
2 series · 2 of 2 positions shown · non-contrast
Comparison: December 20, 2016.

CLINICAL DATA: Uncertain neoplasm of right kidney.

EXAM:
CHEST - 2 VIEW

[w chest pa]
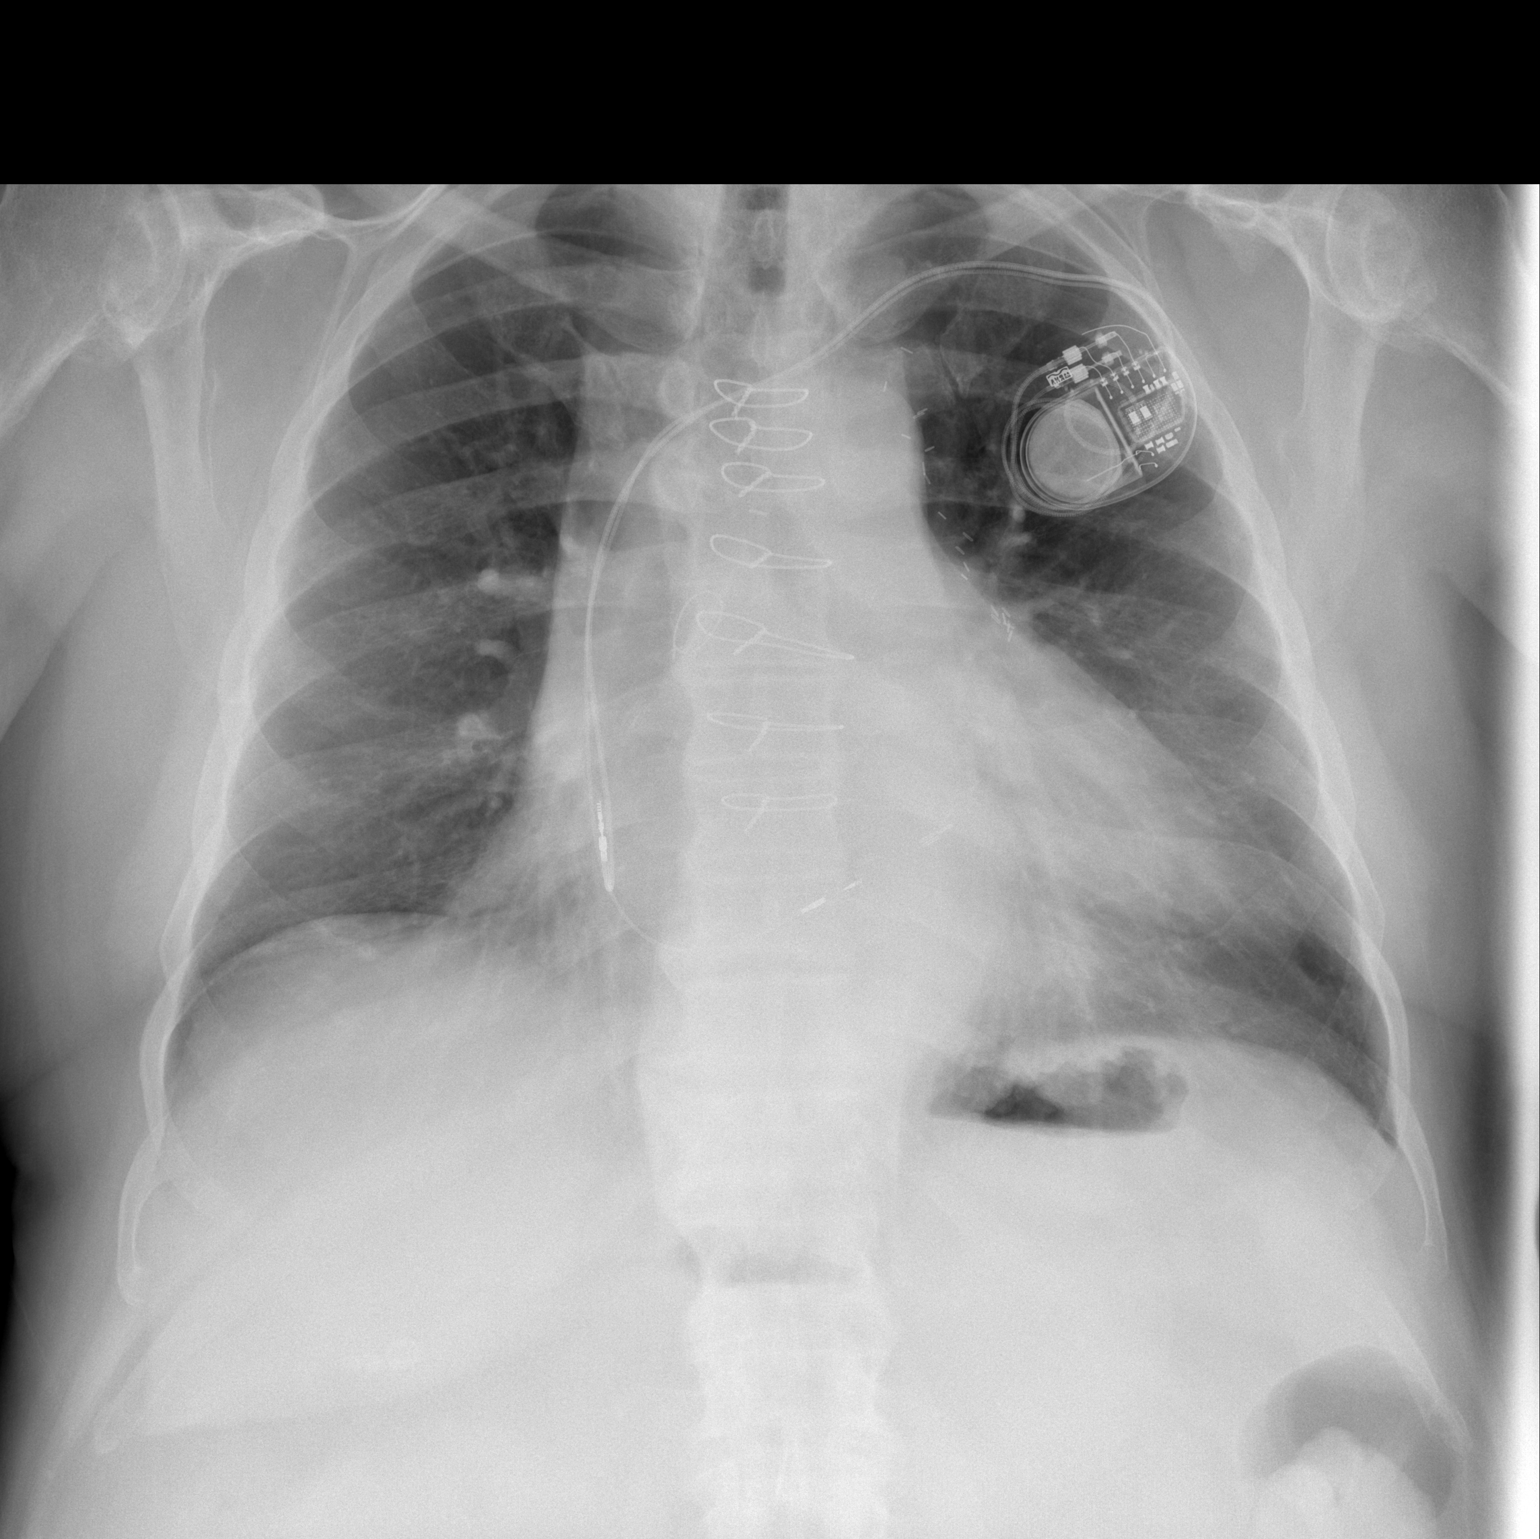

[w chest lat]
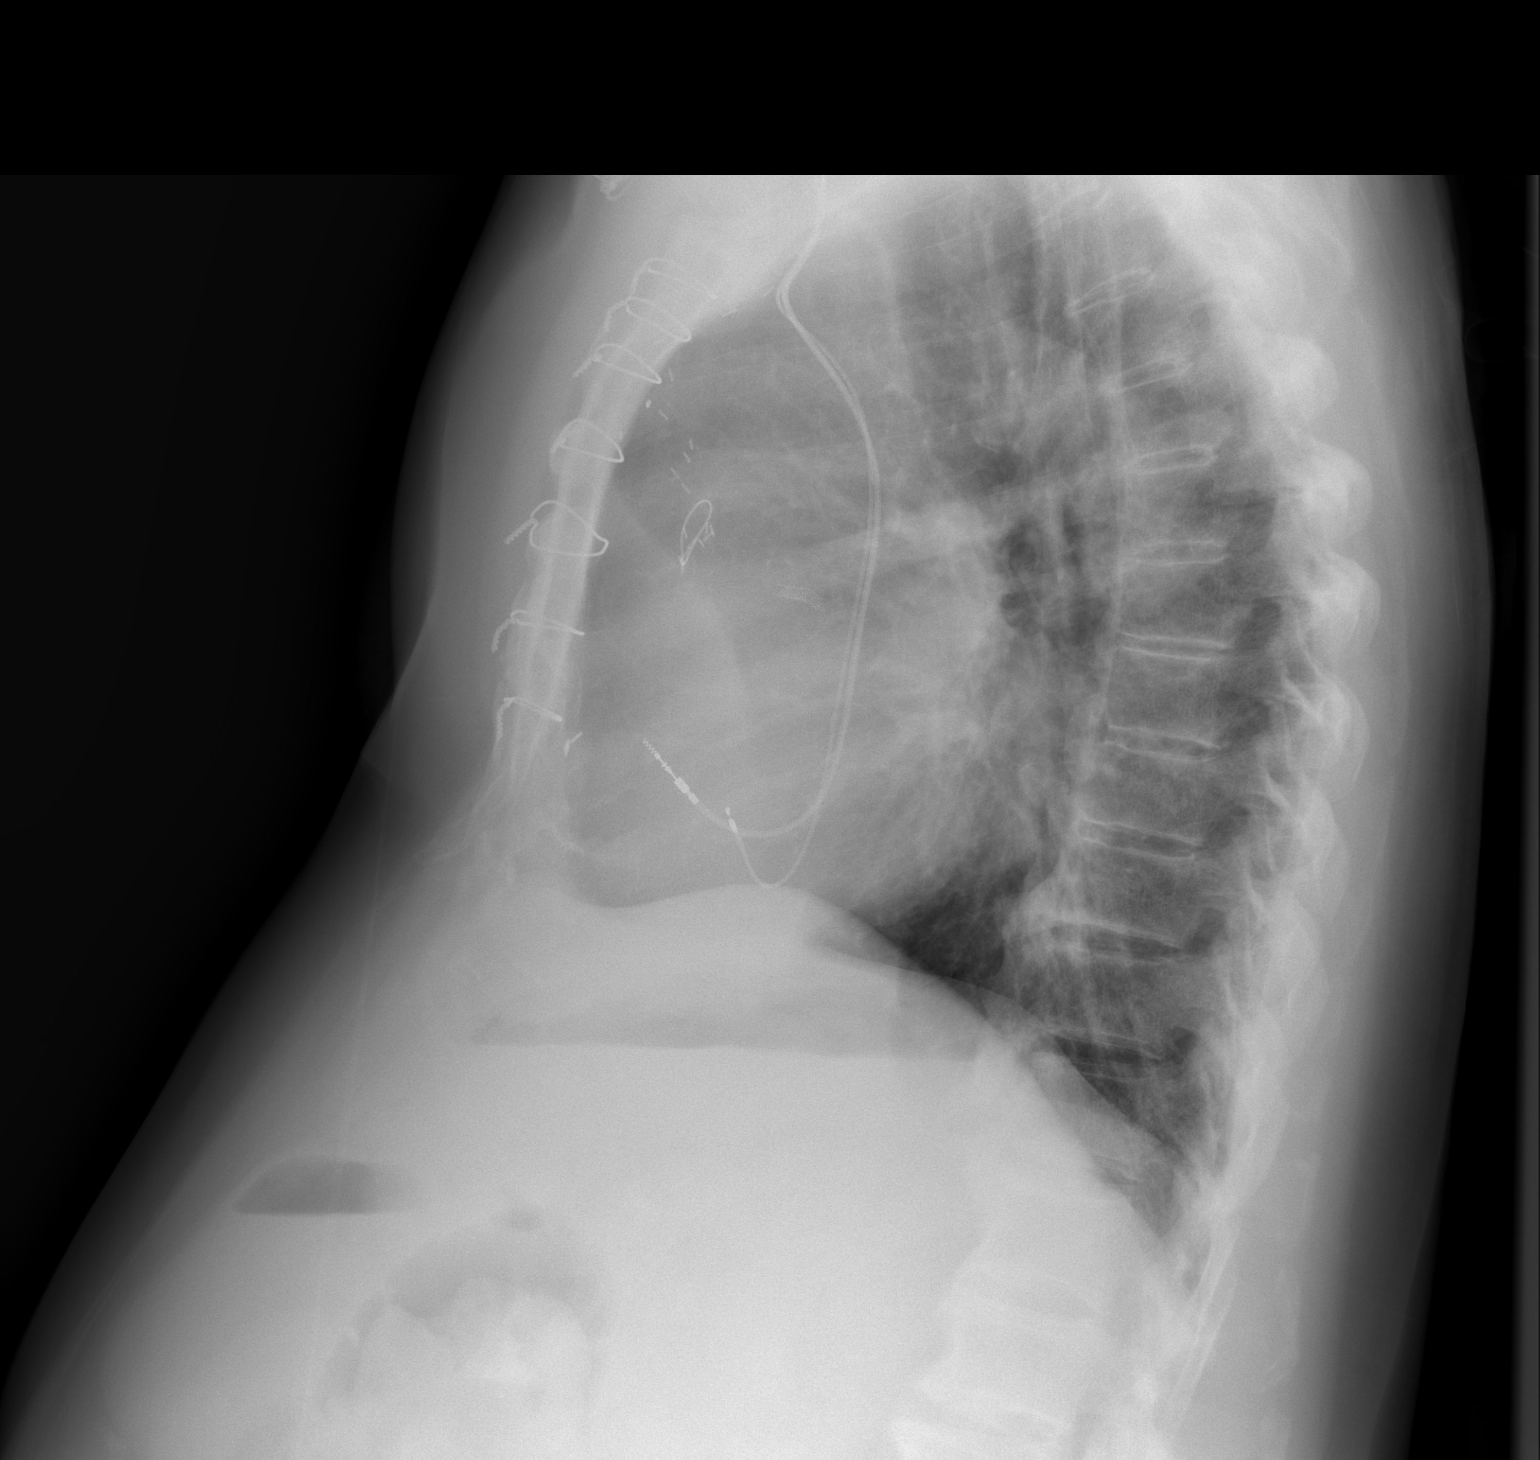

[2 of 2 positions shown; findings below may reference images not displayed]

FINDINGS: Stable cardiomegaly. Status post coronary bypass graft. Left-sided
pacemaker is noted in grossly good position. No pneumothorax or
pleural effusion is noted. Both lungs are clear. The visualized
skeletal structures are unremarkable.
IMPRESSION: No active cardiopulmonary disease.

## 2022-01-30 DIAGNOSIS — D509 Iron deficiency anemia, unspecified: Secondary | ICD-10-CM | POA: Diagnosis not present

## 2022-01-30 DIAGNOSIS — D631 Anemia in chronic kidney disease: Secondary | ICD-10-CM | POA: Diagnosis not present

## 2022-01-30 DIAGNOSIS — E876 Hypokalemia: Secondary | ICD-10-CM | POA: Diagnosis not present

## 2022-01-30 DIAGNOSIS — Z992 Dependence on renal dialysis: Secondary | ICD-10-CM | POA: Diagnosis not present

## 2022-01-30 DIAGNOSIS — N2581 Secondary hyperparathyroidism of renal origin: Secondary | ICD-10-CM | POA: Diagnosis not present

## 2022-01-30 DIAGNOSIS — N186 End stage renal disease: Secondary | ICD-10-CM | POA: Diagnosis not present

## 2022-02-01 DIAGNOSIS — D509 Iron deficiency anemia, unspecified: Secondary | ICD-10-CM | POA: Diagnosis not present

## 2022-02-01 DIAGNOSIS — N186 End stage renal disease: Secondary | ICD-10-CM | POA: Diagnosis not present

## 2022-02-01 DIAGNOSIS — N2581 Secondary hyperparathyroidism of renal origin: Secondary | ICD-10-CM | POA: Diagnosis not present

## 2022-02-01 DIAGNOSIS — D631 Anemia in chronic kidney disease: Secondary | ICD-10-CM | POA: Diagnosis not present

## 2022-02-01 DIAGNOSIS — E876 Hypokalemia: Secondary | ICD-10-CM | POA: Diagnosis not present

## 2022-02-01 DIAGNOSIS — Z992 Dependence on renal dialysis: Secondary | ICD-10-CM | POA: Diagnosis not present

## 2022-02-03 DIAGNOSIS — N186 End stage renal disease: Secondary | ICD-10-CM | POA: Diagnosis not present

## 2022-02-03 DIAGNOSIS — D631 Anemia in chronic kidney disease: Secondary | ICD-10-CM | POA: Diagnosis not present

## 2022-02-03 DIAGNOSIS — E876 Hypokalemia: Secondary | ICD-10-CM | POA: Diagnosis not present

## 2022-02-03 DIAGNOSIS — D509 Iron deficiency anemia, unspecified: Secondary | ICD-10-CM | POA: Diagnosis not present

## 2022-02-03 DIAGNOSIS — Z992 Dependence on renal dialysis: Secondary | ICD-10-CM | POA: Diagnosis not present

## 2022-02-03 DIAGNOSIS — N2581 Secondary hyperparathyroidism of renal origin: Secondary | ICD-10-CM | POA: Diagnosis not present

## 2022-02-06 DIAGNOSIS — D509 Iron deficiency anemia, unspecified: Secondary | ICD-10-CM | POA: Diagnosis not present

## 2022-02-06 DIAGNOSIS — N2581 Secondary hyperparathyroidism of renal origin: Secondary | ICD-10-CM | POA: Diagnosis not present

## 2022-02-06 DIAGNOSIS — Z992 Dependence on renal dialysis: Secondary | ICD-10-CM | POA: Diagnosis not present

## 2022-02-06 DIAGNOSIS — D631 Anemia in chronic kidney disease: Secondary | ICD-10-CM | POA: Diagnosis not present

## 2022-02-06 DIAGNOSIS — E876 Hypokalemia: Secondary | ICD-10-CM | POA: Diagnosis not present

## 2022-02-06 DIAGNOSIS — N186 End stage renal disease: Secondary | ICD-10-CM | POA: Diagnosis not present

## 2022-02-08 DIAGNOSIS — Z992 Dependence on renal dialysis: Secondary | ICD-10-CM | POA: Diagnosis not present

## 2022-02-08 DIAGNOSIS — N2581 Secondary hyperparathyroidism of renal origin: Secondary | ICD-10-CM | POA: Diagnosis not present

## 2022-02-08 DIAGNOSIS — N186 End stage renal disease: Secondary | ICD-10-CM | POA: Diagnosis not present

## 2022-02-08 DIAGNOSIS — D509 Iron deficiency anemia, unspecified: Secondary | ICD-10-CM | POA: Diagnosis not present

## 2022-02-08 DIAGNOSIS — I129 Hypertensive chronic kidney disease with stage 1 through stage 4 chronic kidney disease, or unspecified chronic kidney disease: Secondary | ICD-10-CM | POA: Diagnosis not present

## 2022-02-08 DIAGNOSIS — D631 Anemia in chronic kidney disease: Secondary | ICD-10-CM | POA: Diagnosis not present

## 2022-02-08 DIAGNOSIS — E876 Hypokalemia: Secondary | ICD-10-CM | POA: Diagnosis not present

## 2022-02-10 DIAGNOSIS — N186 End stage renal disease: Secondary | ICD-10-CM | POA: Diagnosis not present

## 2022-02-10 DIAGNOSIS — D509 Iron deficiency anemia, unspecified: Secondary | ICD-10-CM | POA: Diagnosis not present

## 2022-02-10 DIAGNOSIS — E876 Hypokalemia: Secondary | ICD-10-CM | POA: Diagnosis not present

## 2022-02-10 DIAGNOSIS — Z992 Dependence on renal dialysis: Secondary | ICD-10-CM | POA: Diagnosis not present

## 2022-02-10 DIAGNOSIS — N2581 Secondary hyperparathyroidism of renal origin: Secondary | ICD-10-CM | POA: Diagnosis not present

## 2022-02-10 DIAGNOSIS — D631 Anemia in chronic kidney disease: Secondary | ICD-10-CM | POA: Diagnosis not present

## 2022-02-13 DIAGNOSIS — E876 Hypokalemia: Secondary | ICD-10-CM | POA: Diagnosis not present

## 2022-02-13 DIAGNOSIS — D509 Iron deficiency anemia, unspecified: Secondary | ICD-10-CM | POA: Diagnosis not present

## 2022-02-13 DIAGNOSIS — N186 End stage renal disease: Secondary | ICD-10-CM | POA: Diagnosis not present

## 2022-02-13 DIAGNOSIS — N2581 Secondary hyperparathyroidism of renal origin: Secondary | ICD-10-CM | POA: Diagnosis not present

## 2022-02-13 DIAGNOSIS — D631 Anemia in chronic kidney disease: Secondary | ICD-10-CM | POA: Diagnosis not present

## 2022-02-13 DIAGNOSIS — Z992 Dependence on renal dialysis: Secondary | ICD-10-CM | POA: Diagnosis not present

## 2022-02-15 ENCOUNTER — Ambulatory Visit (INDEPENDENT_AMBULATORY_CARE_PROVIDER_SITE_OTHER): Payer: Medicare Other

## 2022-02-15 DIAGNOSIS — I442 Atrioventricular block, complete: Secondary | ICD-10-CM

## 2022-02-15 DIAGNOSIS — Z992 Dependence on renal dialysis: Secondary | ICD-10-CM | POA: Diagnosis not present

## 2022-02-15 DIAGNOSIS — N186 End stage renal disease: Secondary | ICD-10-CM | POA: Diagnosis not present

## 2022-02-15 DIAGNOSIS — D509 Iron deficiency anemia, unspecified: Secondary | ICD-10-CM | POA: Diagnosis not present

## 2022-02-15 DIAGNOSIS — N2581 Secondary hyperparathyroidism of renal origin: Secondary | ICD-10-CM | POA: Diagnosis not present

## 2022-02-15 DIAGNOSIS — E876 Hypokalemia: Secondary | ICD-10-CM | POA: Diagnosis not present

## 2022-02-15 DIAGNOSIS — D631 Anemia in chronic kidney disease: Secondary | ICD-10-CM | POA: Diagnosis not present

## 2022-02-15 LAB — CUP PACEART REMOTE DEVICE CHECK
Battery Remaining Longevity: 36 mo
Battery Voltage: 2.87 V
Brady Statistic AP VP Percent: 16.57 %
Brady Statistic AP VS Percent: 0.01 %
Brady Statistic AS VP Percent: 82.44 %
Brady Statistic AS VS Percent: 0.92 %
Brady Statistic RA Percent Paced: 14.79 %
Brady Statistic RV Percent Paced: 98.92 %
Date Time Interrogation Session: 20240206212440
Implantable Lead Connection Status: 753985
Implantable Lead Connection Status: 753985
Implantable Lead Implant Date: 20181217
Implantable Lead Implant Date: 20181217
Implantable Lead Location: 753859
Implantable Lead Location: 753859
Implantable Lead Model: 3830
Implantable Lead Model: 5076
Implantable Pulse Generator Implant Date: 20181217
Lead Channel Impedance Value: 323 Ohm
Lead Channel Impedance Value: 380 Ohm
Lead Channel Impedance Value: 437 Ohm
Lead Channel Impedance Value: 475 Ohm
Lead Channel Pacing Threshold Amplitude: 0.5 V
Lead Channel Pacing Threshold Amplitude: 0.875 V
Lead Channel Pacing Threshold Pulse Width: 0.4 ms
Lead Channel Pacing Threshold Pulse Width: 0.4 ms
Lead Channel Sensing Intrinsic Amplitude: 1.25 mV
Lead Channel Sensing Intrinsic Amplitude: 1.25 mV
Lead Channel Sensing Intrinsic Amplitude: 10.5 mV
Lead Channel Sensing Intrinsic Amplitude: 10.5 mV
Lead Channel Setting Pacing Amplitude: 2 V
Lead Channel Setting Pacing Amplitude: 2.5 V
Lead Channel Setting Pacing Pulse Width: 1 ms
Lead Channel Setting Sensing Sensitivity: 1.2 mV
Zone Setting Status: 755011
Zone Setting Status: 755011

## 2022-02-17 DIAGNOSIS — D631 Anemia in chronic kidney disease: Secondary | ICD-10-CM | POA: Diagnosis not present

## 2022-02-17 DIAGNOSIS — E876 Hypokalemia: Secondary | ICD-10-CM | POA: Diagnosis not present

## 2022-02-17 DIAGNOSIS — Z992 Dependence on renal dialysis: Secondary | ICD-10-CM | POA: Diagnosis not present

## 2022-02-17 DIAGNOSIS — N2581 Secondary hyperparathyroidism of renal origin: Secondary | ICD-10-CM | POA: Diagnosis not present

## 2022-02-17 DIAGNOSIS — N186 End stage renal disease: Secondary | ICD-10-CM | POA: Diagnosis not present

## 2022-02-17 DIAGNOSIS — D509 Iron deficiency anemia, unspecified: Secondary | ICD-10-CM | POA: Diagnosis not present

## 2022-02-20 DIAGNOSIS — Z992 Dependence on renal dialysis: Secondary | ICD-10-CM | POA: Diagnosis not present

## 2022-02-20 DIAGNOSIS — D631 Anemia in chronic kidney disease: Secondary | ICD-10-CM | POA: Diagnosis not present

## 2022-02-20 DIAGNOSIS — N186 End stage renal disease: Secondary | ICD-10-CM | POA: Diagnosis not present

## 2022-02-20 DIAGNOSIS — D509 Iron deficiency anemia, unspecified: Secondary | ICD-10-CM | POA: Diagnosis not present

## 2022-02-20 DIAGNOSIS — N2581 Secondary hyperparathyroidism of renal origin: Secondary | ICD-10-CM | POA: Diagnosis not present

## 2022-02-20 DIAGNOSIS — E876 Hypokalemia: Secondary | ICD-10-CM | POA: Diagnosis not present

## 2022-02-22 DIAGNOSIS — N2581 Secondary hyperparathyroidism of renal origin: Secondary | ICD-10-CM | POA: Diagnosis not present

## 2022-02-22 DIAGNOSIS — N186 End stage renal disease: Secondary | ICD-10-CM | POA: Diagnosis not present

## 2022-02-22 DIAGNOSIS — Z992 Dependence on renal dialysis: Secondary | ICD-10-CM | POA: Diagnosis not present

## 2022-02-22 DIAGNOSIS — E876 Hypokalemia: Secondary | ICD-10-CM | POA: Diagnosis not present

## 2022-02-22 DIAGNOSIS — D631 Anemia in chronic kidney disease: Secondary | ICD-10-CM | POA: Diagnosis not present

## 2022-02-22 DIAGNOSIS — D509 Iron deficiency anemia, unspecified: Secondary | ICD-10-CM | POA: Diagnosis not present

## 2022-02-24 DIAGNOSIS — D509 Iron deficiency anemia, unspecified: Secondary | ICD-10-CM | POA: Diagnosis not present

## 2022-02-24 DIAGNOSIS — D631 Anemia in chronic kidney disease: Secondary | ICD-10-CM | POA: Diagnosis not present

## 2022-02-24 DIAGNOSIS — N2581 Secondary hyperparathyroidism of renal origin: Secondary | ICD-10-CM | POA: Diagnosis not present

## 2022-02-24 DIAGNOSIS — E876 Hypokalemia: Secondary | ICD-10-CM | POA: Diagnosis not present

## 2022-02-24 DIAGNOSIS — Z992 Dependence on renal dialysis: Secondary | ICD-10-CM | POA: Diagnosis not present

## 2022-02-24 DIAGNOSIS — N186 End stage renal disease: Secondary | ICD-10-CM | POA: Diagnosis not present

## 2022-02-27 DIAGNOSIS — N2581 Secondary hyperparathyroidism of renal origin: Secondary | ICD-10-CM | POA: Diagnosis not present

## 2022-02-27 DIAGNOSIS — D509 Iron deficiency anemia, unspecified: Secondary | ICD-10-CM | POA: Diagnosis not present

## 2022-02-27 DIAGNOSIS — N186 End stage renal disease: Secondary | ICD-10-CM | POA: Diagnosis not present

## 2022-02-27 DIAGNOSIS — D631 Anemia in chronic kidney disease: Secondary | ICD-10-CM | POA: Diagnosis not present

## 2022-02-27 DIAGNOSIS — E876 Hypokalemia: Secondary | ICD-10-CM | POA: Diagnosis not present

## 2022-02-27 DIAGNOSIS — Z992 Dependence on renal dialysis: Secondary | ICD-10-CM | POA: Diagnosis not present

## 2022-03-01 DIAGNOSIS — Z992 Dependence on renal dialysis: Secondary | ICD-10-CM | POA: Diagnosis not present

## 2022-03-01 DIAGNOSIS — D509 Iron deficiency anemia, unspecified: Secondary | ICD-10-CM | POA: Diagnosis not present

## 2022-03-01 DIAGNOSIS — E876 Hypokalemia: Secondary | ICD-10-CM | POA: Diagnosis not present

## 2022-03-01 DIAGNOSIS — D631 Anemia in chronic kidney disease: Secondary | ICD-10-CM | POA: Diagnosis not present

## 2022-03-01 DIAGNOSIS — N2581 Secondary hyperparathyroidism of renal origin: Secondary | ICD-10-CM | POA: Diagnosis not present

## 2022-03-01 DIAGNOSIS — N186 End stage renal disease: Secondary | ICD-10-CM | POA: Diagnosis not present

## 2022-03-02 DIAGNOSIS — C641 Malignant neoplasm of right kidney, except renal pelvis: Secondary | ICD-10-CM | POA: Diagnosis not present

## 2022-03-02 DIAGNOSIS — D3502 Benign neoplasm of left adrenal gland: Secondary | ICD-10-CM | POA: Diagnosis not present

## 2022-03-02 DIAGNOSIS — K8020 Calculus of gallbladder without cholecystitis without obstruction: Secondary | ICD-10-CM | POA: Diagnosis not present

## 2022-03-02 DIAGNOSIS — I700 Atherosclerosis of aorta: Secondary | ICD-10-CM | POA: Diagnosis not present

## 2022-03-02 DIAGNOSIS — D3501 Benign neoplasm of right adrenal gland: Secondary | ICD-10-CM | POA: Diagnosis not present

## 2022-03-02 DIAGNOSIS — N3289 Other specified disorders of bladder: Secondary | ICD-10-CM | POA: Diagnosis not present

## 2022-03-03 DIAGNOSIS — D631 Anemia in chronic kidney disease: Secondary | ICD-10-CM | POA: Diagnosis not present

## 2022-03-03 DIAGNOSIS — E876 Hypokalemia: Secondary | ICD-10-CM | POA: Diagnosis not present

## 2022-03-03 DIAGNOSIS — D509 Iron deficiency anemia, unspecified: Secondary | ICD-10-CM | POA: Diagnosis not present

## 2022-03-03 DIAGNOSIS — N2581 Secondary hyperparathyroidism of renal origin: Secondary | ICD-10-CM | POA: Diagnosis not present

## 2022-03-03 DIAGNOSIS — N186 End stage renal disease: Secondary | ICD-10-CM | POA: Diagnosis not present

## 2022-03-03 DIAGNOSIS — Z992 Dependence on renal dialysis: Secondary | ICD-10-CM | POA: Diagnosis not present

## 2022-03-06 DIAGNOSIS — E876 Hypokalemia: Secondary | ICD-10-CM | POA: Diagnosis not present

## 2022-03-06 DIAGNOSIS — D509 Iron deficiency anemia, unspecified: Secondary | ICD-10-CM | POA: Diagnosis not present

## 2022-03-06 DIAGNOSIS — D631 Anemia in chronic kidney disease: Secondary | ICD-10-CM | POA: Diagnosis not present

## 2022-03-06 DIAGNOSIS — N2581 Secondary hyperparathyroidism of renal origin: Secondary | ICD-10-CM | POA: Diagnosis not present

## 2022-03-06 DIAGNOSIS — Z992 Dependence on renal dialysis: Secondary | ICD-10-CM | POA: Diagnosis not present

## 2022-03-06 DIAGNOSIS — N186 End stage renal disease: Secondary | ICD-10-CM | POA: Diagnosis not present

## 2022-03-08 DIAGNOSIS — N186 End stage renal disease: Secondary | ICD-10-CM | POA: Diagnosis not present

## 2022-03-08 DIAGNOSIS — Z992 Dependence on renal dialysis: Secondary | ICD-10-CM | POA: Diagnosis not present

## 2022-03-08 DIAGNOSIS — D631 Anemia in chronic kidney disease: Secondary | ICD-10-CM | POA: Diagnosis not present

## 2022-03-08 DIAGNOSIS — D509 Iron deficiency anemia, unspecified: Secondary | ICD-10-CM | POA: Diagnosis not present

## 2022-03-08 DIAGNOSIS — N2581 Secondary hyperparathyroidism of renal origin: Secondary | ICD-10-CM | POA: Diagnosis not present

## 2022-03-08 DIAGNOSIS — E876 Hypokalemia: Secondary | ICD-10-CM | POA: Diagnosis not present

## 2022-03-09 DIAGNOSIS — C641 Malignant neoplasm of right kidney, except renal pelvis: Secondary | ICD-10-CM | POA: Diagnosis not present

## 2022-03-09 DIAGNOSIS — I129 Hypertensive chronic kidney disease with stage 1 through stage 4 chronic kidney disease, or unspecified chronic kidney disease: Secondary | ICD-10-CM | POA: Diagnosis not present

## 2022-03-09 DIAGNOSIS — Z992 Dependence on renal dialysis: Secondary | ICD-10-CM | POA: Diagnosis not present

## 2022-03-09 DIAGNOSIS — N186 End stage renal disease: Secondary | ICD-10-CM | POA: Diagnosis not present

## 2022-03-10 DIAGNOSIS — Z992 Dependence on renal dialysis: Secondary | ICD-10-CM | POA: Diagnosis not present

## 2022-03-10 DIAGNOSIS — D631 Anemia in chronic kidney disease: Secondary | ICD-10-CM | POA: Diagnosis not present

## 2022-03-10 DIAGNOSIS — E876 Hypokalemia: Secondary | ICD-10-CM | POA: Diagnosis not present

## 2022-03-10 DIAGNOSIS — N2581 Secondary hyperparathyroidism of renal origin: Secondary | ICD-10-CM | POA: Diagnosis not present

## 2022-03-10 DIAGNOSIS — N186 End stage renal disease: Secondary | ICD-10-CM | POA: Diagnosis not present

## 2022-03-10 DIAGNOSIS — D509 Iron deficiency anemia, unspecified: Secondary | ICD-10-CM | POA: Diagnosis not present

## 2022-03-13 DIAGNOSIS — Z992 Dependence on renal dialysis: Secondary | ICD-10-CM | POA: Diagnosis not present

## 2022-03-13 DIAGNOSIS — N186 End stage renal disease: Secondary | ICD-10-CM | POA: Diagnosis not present

## 2022-03-13 DIAGNOSIS — D631 Anemia in chronic kidney disease: Secondary | ICD-10-CM | POA: Diagnosis not present

## 2022-03-13 DIAGNOSIS — D509 Iron deficiency anemia, unspecified: Secondary | ICD-10-CM | POA: Diagnosis not present

## 2022-03-13 DIAGNOSIS — N2581 Secondary hyperparathyroidism of renal origin: Secondary | ICD-10-CM | POA: Diagnosis not present

## 2022-03-13 DIAGNOSIS — E876 Hypokalemia: Secondary | ICD-10-CM | POA: Diagnosis not present

## 2022-03-14 NOTE — Progress Notes (Signed)
Remote pacemaker transmission.   

## 2022-03-15 DIAGNOSIS — N2581 Secondary hyperparathyroidism of renal origin: Secondary | ICD-10-CM | POA: Diagnosis not present

## 2022-03-15 DIAGNOSIS — D509 Iron deficiency anemia, unspecified: Secondary | ICD-10-CM | POA: Diagnosis not present

## 2022-03-15 DIAGNOSIS — Z992 Dependence on renal dialysis: Secondary | ICD-10-CM | POA: Diagnosis not present

## 2022-03-15 DIAGNOSIS — E876 Hypokalemia: Secondary | ICD-10-CM | POA: Diagnosis not present

## 2022-03-15 DIAGNOSIS — N186 End stage renal disease: Secondary | ICD-10-CM | POA: Diagnosis not present

## 2022-03-15 DIAGNOSIS — D631 Anemia in chronic kidney disease: Secondary | ICD-10-CM | POA: Diagnosis not present

## 2022-03-17 DIAGNOSIS — N186 End stage renal disease: Secondary | ICD-10-CM | POA: Diagnosis not present

## 2022-03-17 DIAGNOSIS — N2581 Secondary hyperparathyroidism of renal origin: Secondary | ICD-10-CM | POA: Diagnosis not present

## 2022-03-17 DIAGNOSIS — E876 Hypokalemia: Secondary | ICD-10-CM | POA: Diagnosis not present

## 2022-03-17 DIAGNOSIS — D509 Iron deficiency anemia, unspecified: Secondary | ICD-10-CM | POA: Diagnosis not present

## 2022-03-17 DIAGNOSIS — Z992 Dependence on renal dialysis: Secondary | ICD-10-CM | POA: Diagnosis not present

## 2022-03-17 DIAGNOSIS — D631 Anemia in chronic kidney disease: Secondary | ICD-10-CM | POA: Diagnosis not present

## 2022-03-20 DIAGNOSIS — N186 End stage renal disease: Secondary | ICD-10-CM | POA: Diagnosis not present

## 2022-03-20 DIAGNOSIS — E876 Hypokalemia: Secondary | ICD-10-CM | POA: Diagnosis not present

## 2022-03-20 DIAGNOSIS — D631 Anemia in chronic kidney disease: Secondary | ICD-10-CM | POA: Diagnosis not present

## 2022-03-20 DIAGNOSIS — D509 Iron deficiency anemia, unspecified: Secondary | ICD-10-CM | POA: Diagnosis not present

## 2022-03-20 DIAGNOSIS — N2581 Secondary hyperparathyroidism of renal origin: Secondary | ICD-10-CM | POA: Diagnosis not present

## 2022-03-20 DIAGNOSIS — Z992 Dependence on renal dialysis: Secondary | ICD-10-CM | POA: Diagnosis not present

## 2022-03-22 DIAGNOSIS — D631 Anemia in chronic kidney disease: Secondary | ICD-10-CM | POA: Diagnosis not present

## 2022-03-22 DIAGNOSIS — N2581 Secondary hyperparathyroidism of renal origin: Secondary | ICD-10-CM | POA: Diagnosis not present

## 2022-03-22 DIAGNOSIS — D509 Iron deficiency anemia, unspecified: Secondary | ICD-10-CM | POA: Diagnosis not present

## 2022-03-22 DIAGNOSIS — Z992 Dependence on renal dialysis: Secondary | ICD-10-CM | POA: Diagnosis not present

## 2022-03-22 DIAGNOSIS — N186 End stage renal disease: Secondary | ICD-10-CM | POA: Diagnosis not present

## 2022-03-22 DIAGNOSIS — E876 Hypokalemia: Secondary | ICD-10-CM | POA: Diagnosis not present

## 2022-03-23 DIAGNOSIS — I871 Compression of vein: Secondary | ICD-10-CM | POA: Diagnosis not present

## 2022-03-23 DIAGNOSIS — H59032 Cystoid macular edema following cataract surgery, left eye: Secondary | ICD-10-CM | POA: Diagnosis not present

## 2022-03-23 DIAGNOSIS — Z992 Dependence on renal dialysis: Secondary | ICD-10-CM | POA: Diagnosis not present

## 2022-03-23 DIAGNOSIS — T82898A Other specified complication of vascular prosthetic devices, implants and grafts, initial encounter: Secondary | ICD-10-CM | POA: Diagnosis not present

## 2022-03-23 DIAGNOSIS — N186 End stage renal disease: Secondary | ICD-10-CM | POA: Diagnosis not present

## 2022-03-24 DIAGNOSIS — E876 Hypokalemia: Secondary | ICD-10-CM | POA: Diagnosis not present

## 2022-03-24 DIAGNOSIS — N2581 Secondary hyperparathyroidism of renal origin: Secondary | ICD-10-CM | POA: Diagnosis not present

## 2022-03-24 DIAGNOSIS — Z992 Dependence on renal dialysis: Secondary | ICD-10-CM | POA: Diagnosis not present

## 2022-03-24 DIAGNOSIS — D631 Anemia in chronic kidney disease: Secondary | ICD-10-CM | POA: Diagnosis not present

## 2022-03-24 DIAGNOSIS — N186 End stage renal disease: Secondary | ICD-10-CM | POA: Diagnosis not present

## 2022-03-24 DIAGNOSIS — D509 Iron deficiency anemia, unspecified: Secondary | ICD-10-CM | POA: Diagnosis not present

## 2022-03-27 DIAGNOSIS — N186 End stage renal disease: Secondary | ICD-10-CM | POA: Diagnosis not present

## 2022-03-27 DIAGNOSIS — N2581 Secondary hyperparathyroidism of renal origin: Secondary | ICD-10-CM | POA: Diagnosis not present

## 2022-03-27 DIAGNOSIS — E876 Hypokalemia: Secondary | ICD-10-CM | POA: Diagnosis not present

## 2022-03-27 DIAGNOSIS — D631 Anemia in chronic kidney disease: Secondary | ICD-10-CM | POA: Diagnosis not present

## 2022-03-27 DIAGNOSIS — D509 Iron deficiency anemia, unspecified: Secondary | ICD-10-CM | POA: Diagnosis not present

## 2022-03-27 DIAGNOSIS — Z992 Dependence on renal dialysis: Secondary | ICD-10-CM | POA: Diagnosis not present

## 2022-03-29 DIAGNOSIS — D631 Anemia in chronic kidney disease: Secondary | ICD-10-CM | POA: Diagnosis not present

## 2022-03-29 DIAGNOSIS — E876 Hypokalemia: Secondary | ICD-10-CM | POA: Diagnosis not present

## 2022-03-29 DIAGNOSIS — N186 End stage renal disease: Secondary | ICD-10-CM | POA: Diagnosis not present

## 2022-03-29 DIAGNOSIS — N2581 Secondary hyperparathyroidism of renal origin: Secondary | ICD-10-CM | POA: Diagnosis not present

## 2022-03-29 DIAGNOSIS — Z992 Dependence on renal dialysis: Secondary | ICD-10-CM | POA: Diagnosis not present

## 2022-03-29 DIAGNOSIS — D509 Iron deficiency anemia, unspecified: Secondary | ICD-10-CM | POA: Diagnosis not present

## 2022-03-31 DIAGNOSIS — E876 Hypokalemia: Secondary | ICD-10-CM | POA: Diagnosis not present

## 2022-03-31 DIAGNOSIS — D631 Anemia in chronic kidney disease: Secondary | ICD-10-CM | POA: Diagnosis not present

## 2022-03-31 DIAGNOSIS — D509 Iron deficiency anemia, unspecified: Secondary | ICD-10-CM | POA: Diagnosis not present

## 2022-03-31 DIAGNOSIS — Z992 Dependence on renal dialysis: Secondary | ICD-10-CM | POA: Diagnosis not present

## 2022-03-31 DIAGNOSIS — N186 End stage renal disease: Secondary | ICD-10-CM | POA: Diagnosis not present

## 2022-03-31 DIAGNOSIS — N2581 Secondary hyperparathyroidism of renal origin: Secondary | ICD-10-CM | POA: Diagnosis not present

## 2022-04-03 DIAGNOSIS — Z992 Dependence on renal dialysis: Secondary | ICD-10-CM | POA: Diagnosis not present

## 2022-04-03 DIAGNOSIS — D631 Anemia in chronic kidney disease: Secondary | ICD-10-CM | POA: Diagnosis not present

## 2022-04-03 DIAGNOSIS — D509 Iron deficiency anemia, unspecified: Secondary | ICD-10-CM | POA: Diagnosis not present

## 2022-04-03 DIAGNOSIS — N2581 Secondary hyperparathyroidism of renal origin: Secondary | ICD-10-CM | POA: Diagnosis not present

## 2022-04-03 DIAGNOSIS — E876 Hypokalemia: Secondary | ICD-10-CM | POA: Diagnosis not present

## 2022-04-03 DIAGNOSIS — N186 End stage renal disease: Secondary | ICD-10-CM | POA: Diagnosis not present

## 2022-04-05 DIAGNOSIS — E876 Hypokalemia: Secondary | ICD-10-CM | POA: Diagnosis not present

## 2022-04-05 DIAGNOSIS — D509 Iron deficiency anemia, unspecified: Secondary | ICD-10-CM | POA: Diagnosis not present

## 2022-04-05 DIAGNOSIS — D631 Anemia in chronic kidney disease: Secondary | ICD-10-CM | POA: Diagnosis not present

## 2022-04-05 DIAGNOSIS — Z992 Dependence on renal dialysis: Secondary | ICD-10-CM | POA: Diagnosis not present

## 2022-04-05 DIAGNOSIS — N186 End stage renal disease: Secondary | ICD-10-CM | POA: Diagnosis not present

## 2022-04-05 DIAGNOSIS — N2581 Secondary hyperparathyroidism of renal origin: Secondary | ICD-10-CM | POA: Diagnosis not present

## 2022-04-07 DIAGNOSIS — Z992 Dependence on renal dialysis: Secondary | ICD-10-CM | POA: Diagnosis not present

## 2022-04-07 DIAGNOSIS — N2581 Secondary hyperparathyroidism of renal origin: Secondary | ICD-10-CM | POA: Diagnosis not present

## 2022-04-07 DIAGNOSIS — E876 Hypokalemia: Secondary | ICD-10-CM | POA: Diagnosis not present

## 2022-04-07 DIAGNOSIS — D631 Anemia in chronic kidney disease: Secondary | ICD-10-CM | POA: Diagnosis not present

## 2022-04-07 DIAGNOSIS — D509 Iron deficiency anemia, unspecified: Secondary | ICD-10-CM | POA: Diagnosis not present

## 2022-04-07 DIAGNOSIS — N186 End stage renal disease: Secondary | ICD-10-CM | POA: Diagnosis not present

## 2022-04-09 DIAGNOSIS — Z992 Dependence on renal dialysis: Secondary | ICD-10-CM | POA: Diagnosis not present

## 2022-04-09 DIAGNOSIS — I129 Hypertensive chronic kidney disease with stage 1 through stage 4 chronic kidney disease, or unspecified chronic kidney disease: Secondary | ICD-10-CM | POA: Diagnosis not present

## 2022-04-09 DIAGNOSIS — N186 End stage renal disease: Secondary | ICD-10-CM | POA: Diagnosis not present

## 2022-04-10 DIAGNOSIS — Z992 Dependence on renal dialysis: Secondary | ICD-10-CM | POA: Diagnosis not present

## 2022-04-10 DIAGNOSIS — D631 Anemia in chronic kidney disease: Secondary | ICD-10-CM | POA: Diagnosis not present

## 2022-04-10 DIAGNOSIS — E876 Hypokalemia: Secondary | ICD-10-CM | POA: Diagnosis not present

## 2022-04-10 DIAGNOSIS — N186 End stage renal disease: Secondary | ICD-10-CM | POA: Diagnosis not present

## 2022-04-10 DIAGNOSIS — N2581 Secondary hyperparathyroidism of renal origin: Secondary | ICD-10-CM | POA: Diagnosis not present

## 2022-04-10 DIAGNOSIS — D509 Iron deficiency anemia, unspecified: Secondary | ICD-10-CM | POA: Diagnosis not present

## 2022-04-12 DIAGNOSIS — D509 Iron deficiency anemia, unspecified: Secondary | ICD-10-CM | POA: Diagnosis not present

## 2022-04-12 DIAGNOSIS — N186 End stage renal disease: Secondary | ICD-10-CM | POA: Diagnosis not present

## 2022-04-12 DIAGNOSIS — E876 Hypokalemia: Secondary | ICD-10-CM | POA: Diagnosis not present

## 2022-04-12 DIAGNOSIS — D631 Anemia in chronic kidney disease: Secondary | ICD-10-CM | POA: Diagnosis not present

## 2022-04-12 DIAGNOSIS — N2581 Secondary hyperparathyroidism of renal origin: Secondary | ICD-10-CM | POA: Diagnosis not present

## 2022-04-12 DIAGNOSIS — Z992 Dependence on renal dialysis: Secondary | ICD-10-CM | POA: Diagnosis not present

## 2022-04-14 DIAGNOSIS — E876 Hypokalemia: Secondary | ICD-10-CM | POA: Diagnosis not present

## 2022-04-14 DIAGNOSIS — D509 Iron deficiency anemia, unspecified: Secondary | ICD-10-CM | POA: Diagnosis not present

## 2022-04-14 DIAGNOSIS — Z992 Dependence on renal dialysis: Secondary | ICD-10-CM | POA: Diagnosis not present

## 2022-04-14 DIAGNOSIS — N2581 Secondary hyperparathyroidism of renal origin: Secondary | ICD-10-CM | POA: Diagnosis not present

## 2022-04-14 DIAGNOSIS — N186 End stage renal disease: Secondary | ICD-10-CM | POA: Diagnosis not present

## 2022-04-14 DIAGNOSIS — D631 Anemia in chronic kidney disease: Secondary | ICD-10-CM | POA: Diagnosis not present

## 2022-04-17 DIAGNOSIS — Z992 Dependence on renal dialysis: Secondary | ICD-10-CM | POA: Diagnosis not present

## 2022-04-17 DIAGNOSIS — D631 Anemia in chronic kidney disease: Secondary | ICD-10-CM | POA: Diagnosis not present

## 2022-04-17 DIAGNOSIS — E876 Hypokalemia: Secondary | ICD-10-CM | POA: Diagnosis not present

## 2022-04-17 DIAGNOSIS — N2581 Secondary hyperparathyroidism of renal origin: Secondary | ICD-10-CM | POA: Diagnosis not present

## 2022-04-17 DIAGNOSIS — D509 Iron deficiency anemia, unspecified: Secondary | ICD-10-CM | POA: Diagnosis not present

## 2022-04-17 DIAGNOSIS — N186 End stage renal disease: Secondary | ICD-10-CM | POA: Diagnosis not present

## 2022-04-19 DIAGNOSIS — D631 Anemia in chronic kidney disease: Secondary | ICD-10-CM | POA: Diagnosis not present

## 2022-04-19 DIAGNOSIS — Z992 Dependence on renal dialysis: Secondary | ICD-10-CM | POA: Diagnosis not present

## 2022-04-19 DIAGNOSIS — E876 Hypokalemia: Secondary | ICD-10-CM | POA: Diagnosis not present

## 2022-04-19 DIAGNOSIS — N2581 Secondary hyperparathyroidism of renal origin: Secondary | ICD-10-CM | POA: Diagnosis not present

## 2022-04-19 DIAGNOSIS — N186 End stage renal disease: Secondary | ICD-10-CM | POA: Diagnosis not present

## 2022-04-19 DIAGNOSIS — D509 Iron deficiency anemia, unspecified: Secondary | ICD-10-CM | POA: Diagnosis not present

## 2022-04-21 DIAGNOSIS — Z992 Dependence on renal dialysis: Secondary | ICD-10-CM | POA: Diagnosis not present

## 2022-04-21 DIAGNOSIS — N186 End stage renal disease: Secondary | ICD-10-CM | POA: Diagnosis not present

## 2022-04-21 DIAGNOSIS — D509 Iron deficiency anemia, unspecified: Secondary | ICD-10-CM | POA: Diagnosis not present

## 2022-04-21 DIAGNOSIS — E876 Hypokalemia: Secondary | ICD-10-CM | POA: Diagnosis not present

## 2022-04-21 DIAGNOSIS — N2581 Secondary hyperparathyroidism of renal origin: Secondary | ICD-10-CM | POA: Diagnosis not present

## 2022-04-21 DIAGNOSIS — D631 Anemia in chronic kidney disease: Secondary | ICD-10-CM | POA: Diagnosis not present

## 2022-04-24 DIAGNOSIS — D631 Anemia in chronic kidney disease: Secondary | ICD-10-CM | POA: Diagnosis not present

## 2022-04-24 DIAGNOSIS — N2581 Secondary hyperparathyroidism of renal origin: Secondary | ICD-10-CM | POA: Diagnosis not present

## 2022-04-24 DIAGNOSIS — Z992 Dependence on renal dialysis: Secondary | ICD-10-CM | POA: Diagnosis not present

## 2022-04-24 DIAGNOSIS — D509 Iron deficiency anemia, unspecified: Secondary | ICD-10-CM | POA: Diagnosis not present

## 2022-04-24 DIAGNOSIS — N186 End stage renal disease: Secondary | ICD-10-CM | POA: Diagnosis not present

## 2022-04-24 DIAGNOSIS — E876 Hypokalemia: Secondary | ICD-10-CM | POA: Diagnosis not present

## 2022-04-26 DIAGNOSIS — N186 End stage renal disease: Secondary | ICD-10-CM | POA: Diagnosis not present

## 2022-04-26 DIAGNOSIS — Z992 Dependence on renal dialysis: Secondary | ICD-10-CM | POA: Diagnosis not present

## 2022-04-26 DIAGNOSIS — N2581 Secondary hyperparathyroidism of renal origin: Secondary | ICD-10-CM | POA: Diagnosis not present

## 2022-04-26 DIAGNOSIS — D631 Anemia in chronic kidney disease: Secondary | ICD-10-CM | POA: Diagnosis not present

## 2022-04-26 DIAGNOSIS — D509 Iron deficiency anemia, unspecified: Secondary | ICD-10-CM | POA: Diagnosis not present

## 2022-04-26 DIAGNOSIS — E876 Hypokalemia: Secondary | ICD-10-CM | POA: Diagnosis not present

## 2022-04-28 DIAGNOSIS — D631 Anemia in chronic kidney disease: Secondary | ICD-10-CM | POA: Diagnosis not present

## 2022-04-28 DIAGNOSIS — D509 Iron deficiency anemia, unspecified: Secondary | ICD-10-CM | POA: Diagnosis not present

## 2022-04-28 DIAGNOSIS — Z992 Dependence on renal dialysis: Secondary | ICD-10-CM | POA: Diagnosis not present

## 2022-04-28 DIAGNOSIS — N186 End stage renal disease: Secondary | ICD-10-CM | POA: Diagnosis not present

## 2022-04-28 DIAGNOSIS — N2581 Secondary hyperparathyroidism of renal origin: Secondary | ICD-10-CM | POA: Diagnosis not present

## 2022-04-28 DIAGNOSIS — E876 Hypokalemia: Secondary | ICD-10-CM | POA: Diagnosis not present

## 2022-05-01 ENCOUNTER — Emergency Department (HOSPITAL_COMMUNITY)
Admission: EM | Admit: 2022-05-01 | Discharge: 2022-05-01 | Disposition: A | Discharge status: Home / Self Care | Payer: Medicare Other | Attending: Emergency Medicine | Admitting: Emergency Medicine

## 2022-05-01 ENCOUNTER — Encounter (HOSPITAL_COMMUNITY): Payer: Self-pay

## 2022-05-01 ENCOUNTER — Other Ambulatory Visit: Payer: Self-pay

## 2022-05-01 DIAGNOSIS — L7622 Postprocedural hemorrhage and hematoma of skin and subcutaneous tissue following other procedure: Secondary | ICD-10-CM | POA: Diagnosis not present

## 2022-05-01 DIAGNOSIS — N186 End stage renal disease: Secondary | ICD-10-CM | POA: Diagnosis not present

## 2022-05-01 DIAGNOSIS — T82519A Breakdown (mechanical) of unspecified cardiac and vascular devices and implants, initial encounter: Secondary | ICD-10-CM | POA: Diagnosis not present

## 2022-05-01 DIAGNOSIS — N2581 Secondary hyperparathyroidism of renal origin: Secondary | ICD-10-CM | POA: Diagnosis not present

## 2022-05-01 DIAGNOSIS — T82838A Hemorrhage of vascular prosthetic devices, implants and grafts, initial encounter: Secondary | ICD-10-CM | POA: Diagnosis not present

## 2022-05-01 DIAGNOSIS — D631 Anemia in chronic kidney disease: Secondary | ICD-10-CM | POA: Diagnosis not present

## 2022-05-01 DIAGNOSIS — Z743 Need for continuous supervision: Secondary | ICD-10-CM | POA: Diagnosis not present

## 2022-05-01 DIAGNOSIS — E876 Hypokalemia: Secondary | ICD-10-CM | POA: Diagnosis not present

## 2022-05-01 DIAGNOSIS — Z992 Dependence on renal dialysis: Secondary | ICD-10-CM | POA: Diagnosis not present

## 2022-05-01 DIAGNOSIS — R58 Hemorrhage, not elsewhere classified: Secondary | ICD-10-CM | POA: Diagnosis not present

## 2022-05-01 DIAGNOSIS — Y732 Prosthetic and other implants, materials and accessory gastroenterology and urology devices associated with adverse incidents: Secondary | ICD-10-CM | POA: Insufficient documentation

## 2022-05-01 DIAGNOSIS — D509 Iron deficiency anemia, unspecified: Secondary | ICD-10-CM | POA: Diagnosis not present

## 2022-05-01 NOTE — ED Provider Notes (Signed)
[place] EMERGENCY DEPARTMENT AT [facility] Provider Note   CSN: [id number] Arrival date & time: 05/01/22  1035     History Chief Complaint  Patient presents with   Vascular Access Problem    HPI Paul Price is a 82 y.o. male presenting for chief complaint of fistula bleeding. Had his dialysis site access this morning.  Has a history of longstanding bleeding after dialysis access, however today was more profound than normal. He has a clamp in place.  Denies any fevers chills nausea vomit syncope shortness of breath lightheadedness or any other symptoms.  States he feels otherwise fine and the bleeding is stopped would like to be discharged.   Patient's recorded medical, surgical, social, medication list and allergies were reviewed in the Snapshot window as part of the initial history.   Review of Systems   Review of Systems  Constitutional:  Negative for chills and fever.  HENT:  Negative for ear pain and sore throat.   Eyes:  Negative for pain and visual disturbance.  Respiratory:  Negative for cough and shortness of breath.   Cardiovascular:  Negative for chest pain and palpitations.  Gastrointestinal:  Negative for abdominal pain and vomiting.  Genitourinary:  Negative for dysuria and hematuria.  Musculoskeletal:  Negative for arthralgias and back pain.  Skin:  Negative for color change and rash.  Neurological:  Negative for seizures and syncope.  All other systems reviewed and are negative.   Physical Exam Updated Vital Signs BP (!) 151/74 (BP Location: Left Arm)   Pulse 76   Temp (!) 97.5 F (36.4 C) (Oral)   Resp 20   SpO2 99%  Physical Exam Vitals and nursing note reviewed.  Constitutional:      General: He is not in acute distress.    Appearance: He is well-developed.  HENT:     Head: Normocephalic and atraumatic.  Eyes:     Conjunctiva/sclera: Conjunctivae normal.  Cardiovascular:     Rate and Rhythm: Normal rate and regular rhythm.      Heart sounds: No murmur heard. Pulmonary:     Effort: Pulmonary effort is normal. No respiratory distress.     Breath sounds: Normal breath sounds.  Abdominal:     Palpations: Abdomen is soft.     Tenderness: There is no abdominal tenderness.  Musculoskeletal:        General: No swelling.     Cervical back: Neck supple.     Comments: Arrives with fistula clamp in place to RUE  Skin:    General: Skin is warm and dry.     Capillary Refill: Capillary refill takes less than 2 seconds.  Neurological:     Mental Status: He is alert.  Psychiatric:        Mood and Affect: Mood normal.      ED Course/ Medical Decision Making/ A&P    Procedures Procedures   Medications Ordered in ED Medications - No data to display  Medical Decision Making:   This a patient presenting with bleeding from his fistula site. Lamp was taken down and bleeding is now resolved. He is asymptomatic denies fevers chills nausea vomiting syncope shortness of breath in no acute distress. Pressure dressing was placed and patient was observed in emergency room. Pressure dressing maintained hemostasis throughout observation. Will plan for patient to use pressure dressing for next 24 hours before removing. No acute indication for further diagnostic evaluation given lack of symptoms like tachycardia.  Patient's description is not consistent  with acute blood loss anemia.  Disposition:  I have considered need for hospitalization, however, considering all of the above, I believe this patient is stable for discharge at this time.  Patient/family educated about specific return precautions for given chief complaint and symptoms.  Patient/family educated about follow-up with PCP .     Patient/family expressed understanding of return precautions and need for follow-up. Patient spoken to regarding all imaging and laboratory results and appropriate follow up for these results. All education provided in verbal form with  additional information in written form. Time was allowed for answering of patient questions. Patient discharged.    Emergency Department Medication Summary:   Medications - No data to display      Clinical Impression:  1. Bleeding      Discharge   Final Clinical Impression(s) / ED Diagnoses Final diagnoses:  Bleeding    Rx / DC Orders ED Discharge Orders     None         Glyn Ade, MD 05/01/22 1103

## 2022-05-01 NOTE — ED Notes (Signed)
I agree with the charting for triage completed by nursing student Alyssa G.

## 2022-05-01 NOTE — ED Triage Notes (Signed)
Pt bibgems from dialysis. Pt receiving full treatment at the end could not control bleeding during de-access, clamp applied by dialysis nurse. VSS for EMS. Pt reports while accessing right sided fistula "felt funny" and "blood shot across the room".

## 2022-05-03 DIAGNOSIS — D631 Anemia in chronic kidney disease: Secondary | ICD-10-CM | POA: Diagnosis not present

## 2022-05-03 DIAGNOSIS — D509 Iron deficiency anemia, unspecified: Secondary | ICD-10-CM | POA: Diagnosis not present

## 2022-05-03 DIAGNOSIS — Z992 Dependence on renal dialysis: Secondary | ICD-10-CM | POA: Diagnosis not present

## 2022-05-03 DIAGNOSIS — N186 End stage renal disease: Secondary | ICD-10-CM | POA: Diagnosis not present

## 2022-05-03 DIAGNOSIS — E876 Hypokalemia: Secondary | ICD-10-CM | POA: Diagnosis not present

## 2022-05-03 DIAGNOSIS — N2581 Secondary hyperparathyroidism of renal origin: Secondary | ICD-10-CM | POA: Diagnosis not present

## 2022-05-05 DIAGNOSIS — D509 Iron deficiency anemia, unspecified: Secondary | ICD-10-CM | POA: Diagnosis not present

## 2022-05-05 DIAGNOSIS — D631 Anemia in chronic kidney disease: Secondary | ICD-10-CM | POA: Diagnosis not present

## 2022-05-05 DIAGNOSIS — N186 End stage renal disease: Secondary | ICD-10-CM | POA: Diagnosis not present

## 2022-05-05 DIAGNOSIS — E876 Hypokalemia: Secondary | ICD-10-CM | POA: Diagnosis not present

## 2022-05-05 DIAGNOSIS — Z992 Dependence on renal dialysis: Secondary | ICD-10-CM | POA: Diagnosis not present

## 2022-05-05 DIAGNOSIS — N2581 Secondary hyperparathyroidism of renal origin: Secondary | ICD-10-CM | POA: Diagnosis not present

## 2022-05-08 ENCOUNTER — Telehealth: Payer: Self-pay | Admitting: *Deleted

## 2022-05-08 DIAGNOSIS — E876 Hypokalemia: Secondary | ICD-10-CM | POA: Diagnosis not present

## 2022-05-08 DIAGNOSIS — D509 Iron deficiency anemia, unspecified: Secondary | ICD-10-CM | POA: Diagnosis not present

## 2022-05-08 DIAGNOSIS — D631 Anemia in chronic kidney disease: Secondary | ICD-10-CM | POA: Diagnosis not present

## 2022-05-08 DIAGNOSIS — N186 End stage renal disease: Secondary | ICD-10-CM | POA: Diagnosis not present

## 2022-05-08 DIAGNOSIS — N2581 Secondary hyperparathyroidism of renal origin: Secondary | ICD-10-CM | POA: Diagnosis not present

## 2022-05-08 DIAGNOSIS — Z992 Dependence on renal dialysis: Secondary | ICD-10-CM | POA: Diagnosis not present

## 2022-05-08 NOTE — Telephone Encounter (Signed)
Transition Care Management Follow-up Telephone Call Date of discharge and from where: [place] [facility] 05/01/2022 How have you been since you were released from the hospital? 7days  Any questions or concerns? No , doing well   Items Reviewed: Did the pt receive and understand the discharge instructions provided? Yes  Medications obtained and verified? Yes  Other? Yes  Any new allergies since your discharge? Yes  Dietary orders reviewed? Yes Do you have support at home? Yes   Follow up appointments :  PCP Hospital f/u appt confirmed? No  WILL FOLLOW UP IF NEEDED  Specialist Hospital f/u appt confirmed? No  Scheduled  Are transportation arrangements needed? NO If their condition worsens, is the pt aware to call PCP or go to the Emergency Dept.? Yes Was the patient provided with contact information for the PCP's office or ED? Yes Was to pt encouraged to call back with questions or concerns? Yes Paul Price [facility] [place], Population [facility] [phone] [address] [place] , [place] [address] Email : [email or web address]

## 2022-05-09 DIAGNOSIS — N186 End stage renal disease: Secondary | ICD-10-CM | POA: Diagnosis not present

## 2022-05-09 DIAGNOSIS — Z992 Dependence on renal dialysis: Secondary | ICD-10-CM | POA: Diagnosis not present

## 2022-05-09 DIAGNOSIS — I129 Hypertensive chronic kidney disease with stage 1 through stage 4 chronic kidney disease, or unspecified chronic kidney disease: Secondary | ICD-10-CM | POA: Diagnosis not present

## 2022-05-10 DIAGNOSIS — E876 Hypokalemia: Secondary | ICD-10-CM | POA: Diagnosis not present

## 2022-05-10 DIAGNOSIS — D631 Anemia in chronic kidney disease: Secondary | ICD-10-CM | POA: Diagnosis not present

## 2022-05-10 DIAGNOSIS — Z992 Dependence on renal dialysis: Secondary | ICD-10-CM | POA: Diagnosis not present

## 2022-05-10 DIAGNOSIS — D689 Coagulation defect, unspecified: Secondary | ICD-10-CM | POA: Diagnosis not present

## 2022-05-10 DIAGNOSIS — D509 Iron deficiency anemia, unspecified: Secondary | ICD-10-CM | POA: Diagnosis not present

## 2022-05-10 DIAGNOSIS — N2581 Secondary hyperparathyroidism of renal origin: Secondary | ICD-10-CM | POA: Diagnosis not present

## 2022-05-10 DIAGNOSIS — N186 End stage renal disease: Secondary | ICD-10-CM | POA: Diagnosis not present

## 2022-05-12 DIAGNOSIS — N186 End stage renal disease: Secondary | ICD-10-CM | POA: Diagnosis not present

## 2022-05-12 DIAGNOSIS — N2581 Secondary hyperparathyroidism of renal origin: Secondary | ICD-10-CM | POA: Diagnosis not present

## 2022-05-12 DIAGNOSIS — D631 Anemia in chronic kidney disease: Secondary | ICD-10-CM | POA: Diagnosis not present

## 2022-05-12 DIAGNOSIS — E876 Hypokalemia: Secondary | ICD-10-CM | POA: Diagnosis not present

## 2022-05-12 DIAGNOSIS — D689 Coagulation defect, unspecified: Secondary | ICD-10-CM | POA: Diagnosis not present

## 2022-05-12 DIAGNOSIS — Z992 Dependence on renal dialysis: Secondary | ICD-10-CM | POA: Diagnosis not present

## 2022-05-12 DIAGNOSIS — D509 Iron deficiency anemia, unspecified: Secondary | ICD-10-CM | POA: Diagnosis not present

## 2022-05-15 DIAGNOSIS — Z992 Dependence on renal dialysis: Secondary | ICD-10-CM | POA: Diagnosis not present

## 2022-05-15 DIAGNOSIS — D509 Iron deficiency anemia, unspecified: Secondary | ICD-10-CM | POA: Diagnosis not present

## 2022-05-15 DIAGNOSIS — D689 Coagulation defect, unspecified: Secondary | ICD-10-CM | POA: Diagnosis not present

## 2022-05-15 DIAGNOSIS — D631 Anemia in chronic kidney disease: Secondary | ICD-10-CM | POA: Diagnosis not present

## 2022-05-15 DIAGNOSIS — N2581 Secondary hyperparathyroidism of renal origin: Secondary | ICD-10-CM | POA: Diagnosis not present

## 2022-05-15 DIAGNOSIS — E876 Hypokalemia: Secondary | ICD-10-CM | POA: Diagnosis not present

## 2022-05-15 DIAGNOSIS — N186 End stage renal disease: Secondary | ICD-10-CM | POA: Diagnosis not present

## 2022-05-17 ENCOUNTER — Ambulatory Visit (INDEPENDENT_AMBULATORY_CARE_PROVIDER_SITE_OTHER): Payer: Medicare Other

## 2022-05-17 DIAGNOSIS — Z992 Dependence on renal dialysis: Secondary | ICD-10-CM | POA: Diagnosis not present

## 2022-05-17 DIAGNOSIS — N186 End stage renal disease: Secondary | ICD-10-CM | POA: Diagnosis not present

## 2022-05-17 DIAGNOSIS — I442 Atrioventricular block, complete: Secondary | ICD-10-CM

## 2022-05-17 DIAGNOSIS — D631 Anemia in chronic kidney disease: Secondary | ICD-10-CM | POA: Diagnosis not present

## 2022-05-17 DIAGNOSIS — D509 Iron deficiency anemia, unspecified: Secondary | ICD-10-CM | POA: Diagnosis not present

## 2022-05-17 DIAGNOSIS — E876 Hypokalemia: Secondary | ICD-10-CM | POA: Diagnosis not present

## 2022-05-17 DIAGNOSIS — N2581 Secondary hyperparathyroidism of renal origin: Secondary | ICD-10-CM | POA: Diagnosis not present

## 2022-05-17 DIAGNOSIS — D689 Coagulation defect, unspecified: Secondary | ICD-10-CM | POA: Diagnosis not present

## 2022-05-17 LAB — CUP PACEART REMOTE DEVICE CHECK
Battery Remaining Longevity: 32 mo
Battery Voltage: 2.86 V
Brady Statistic AP VP Percent: 20.59 %
Brady Statistic AP VS Percent: 0.02 %
Brady Statistic AS VP Percent: 78.2 %
Brady Statistic AS VS Percent: 1.15 %
Brady Statistic RA Percent Paced: 17.96 %
Brady Statistic RV Percent Paced: 95.87 %
Date Time Interrogation Session: 20240507185156
Implantable Lead Connection Status: 753985
Implantable Lead Connection Status: 753985
Implantable Lead Implant Date: 20181217
Implantable Lead Implant Date: 20181217
Implantable Lead Location: 753859
Implantable Lead Location: 753859
Implantable Lead Model: 3830
Implantable Lead Model: 5076
Implantable Pulse Generator Implant Date: 20181217
Lead Channel Impedance Value: 285 Ohm
Lead Channel Impedance Value: 323 Ohm
Lead Channel Impedance Value: 399 Ohm
Lead Channel Impedance Value: 475 Ohm
Lead Channel Pacing Threshold Amplitude: 0.5 V
Lead Channel Pacing Threshold Amplitude: 0.875 V
Lead Channel Pacing Threshold Pulse Width: 0.4 ms
Lead Channel Pacing Threshold Pulse Width: 0.4 ms
Lead Channel Sensing Intrinsic Amplitude: 0.25 mV
Lead Channel Sensing Intrinsic Amplitude: 0.25 mV
Lead Channel Sensing Intrinsic Amplitude: 10.5 mV
Lead Channel Sensing Intrinsic Amplitude: 10.5 mV
Lead Channel Setting Pacing Amplitude: 2 V
Lead Channel Setting Pacing Amplitude: 2.5 V
Lead Channel Setting Pacing Pulse Width: 1 ms
Lead Channel Setting Sensing Sensitivity: 1.2 mV
Zone Setting Status: 755011
Zone Setting Status: 755011

## 2022-05-19 DIAGNOSIS — N2581 Secondary hyperparathyroidism of renal origin: Secondary | ICD-10-CM | POA: Diagnosis not present

## 2022-05-19 DIAGNOSIS — D689 Coagulation defect, unspecified: Secondary | ICD-10-CM | POA: Diagnosis not present

## 2022-05-19 DIAGNOSIS — N186 End stage renal disease: Secondary | ICD-10-CM | POA: Diagnosis not present

## 2022-05-19 DIAGNOSIS — D509 Iron deficiency anemia, unspecified: Secondary | ICD-10-CM | POA: Diagnosis not present

## 2022-05-19 DIAGNOSIS — Z992 Dependence on renal dialysis: Secondary | ICD-10-CM | POA: Diagnosis not present

## 2022-05-19 DIAGNOSIS — E876 Hypokalemia: Secondary | ICD-10-CM | POA: Diagnosis not present

## 2022-05-19 DIAGNOSIS — D631 Anemia in chronic kidney disease: Secondary | ICD-10-CM | POA: Diagnosis not present

## 2022-05-22 DIAGNOSIS — D509 Iron deficiency anemia, unspecified: Secondary | ICD-10-CM | POA: Diagnosis not present

## 2022-05-22 DIAGNOSIS — E876 Hypokalemia: Secondary | ICD-10-CM | POA: Diagnosis not present

## 2022-05-22 DIAGNOSIS — N2581 Secondary hyperparathyroidism of renal origin: Secondary | ICD-10-CM | POA: Diagnosis not present

## 2022-05-22 DIAGNOSIS — D689 Coagulation defect, unspecified: Secondary | ICD-10-CM | POA: Diagnosis not present

## 2022-05-22 DIAGNOSIS — D631 Anemia in chronic kidney disease: Secondary | ICD-10-CM | POA: Diagnosis not present

## 2022-05-22 DIAGNOSIS — Z992 Dependence on renal dialysis: Secondary | ICD-10-CM | POA: Diagnosis not present

## 2022-05-22 DIAGNOSIS — N186 End stage renal disease: Secondary | ICD-10-CM | POA: Diagnosis not present

## 2022-05-24 DIAGNOSIS — N186 End stage renal disease: Secondary | ICD-10-CM | POA: Diagnosis not present

## 2022-05-24 DIAGNOSIS — Z992 Dependence on renal dialysis: Secondary | ICD-10-CM | POA: Diagnosis not present

## 2022-05-24 DIAGNOSIS — D631 Anemia in chronic kidney disease: Secondary | ICD-10-CM | POA: Diagnosis not present

## 2022-05-24 DIAGNOSIS — E876 Hypokalemia: Secondary | ICD-10-CM | POA: Diagnosis not present

## 2022-05-24 DIAGNOSIS — N2581 Secondary hyperparathyroidism of renal origin: Secondary | ICD-10-CM | POA: Diagnosis not present

## 2022-05-24 DIAGNOSIS — D509 Iron deficiency anemia, unspecified: Secondary | ICD-10-CM | POA: Diagnosis not present

## 2022-05-24 DIAGNOSIS — D689 Coagulation defect, unspecified: Secondary | ICD-10-CM | POA: Diagnosis not present

## 2022-05-26 DIAGNOSIS — D509 Iron deficiency anemia, unspecified: Secondary | ICD-10-CM | POA: Diagnosis not present

## 2022-05-26 DIAGNOSIS — E876 Hypokalemia: Secondary | ICD-10-CM | POA: Diagnosis not present

## 2022-05-26 DIAGNOSIS — N2581 Secondary hyperparathyroidism of renal origin: Secondary | ICD-10-CM | POA: Diagnosis not present

## 2022-05-26 DIAGNOSIS — Z992 Dependence on renal dialysis: Secondary | ICD-10-CM | POA: Diagnosis not present

## 2022-05-26 DIAGNOSIS — D631 Anemia in chronic kidney disease: Secondary | ICD-10-CM | POA: Diagnosis not present

## 2022-05-26 DIAGNOSIS — N186 End stage renal disease: Secondary | ICD-10-CM | POA: Diagnosis not present

## 2022-05-26 DIAGNOSIS — D689 Coagulation defect, unspecified: Secondary | ICD-10-CM | POA: Diagnosis not present

## 2022-05-29 DIAGNOSIS — N2581 Secondary hyperparathyroidism of renal origin: Secondary | ICD-10-CM | POA: Diagnosis not present

## 2022-05-29 DIAGNOSIS — E876 Hypokalemia: Secondary | ICD-10-CM | POA: Diagnosis not present

## 2022-05-29 DIAGNOSIS — D509 Iron deficiency anemia, unspecified: Secondary | ICD-10-CM | POA: Diagnosis not present

## 2022-05-29 DIAGNOSIS — Z992 Dependence on renal dialysis: Secondary | ICD-10-CM | POA: Diagnosis not present

## 2022-05-29 DIAGNOSIS — D631 Anemia in chronic kidney disease: Secondary | ICD-10-CM | POA: Diagnosis not present

## 2022-05-29 DIAGNOSIS — D689 Coagulation defect, unspecified: Secondary | ICD-10-CM | POA: Diagnosis not present

## 2022-05-29 DIAGNOSIS — N186 End stage renal disease: Secondary | ICD-10-CM | POA: Diagnosis not present

## 2022-05-31 DIAGNOSIS — D631 Anemia in chronic kidney disease: Secondary | ICD-10-CM | POA: Diagnosis not present

## 2022-05-31 DIAGNOSIS — Z992 Dependence on renal dialysis: Secondary | ICD-10-CM | POA: Diagnosis not present

## 2022-05-31 DIAGNOSIS — N186 End stage renal disease: Secondary | ICD-10-CM | POA: Diagnosis not present

## 2022-05-31 DIAGNOSIS — N2581 Secondary hyperparathyroidism of renal origin: Secondary | ICD-10-CM | POA: Diagnosis not present

## 2022-05-31 DIAGNOSIS — E876 Hypokalemia: Secondary | ICD-10-CM | POA: Diagnosis not present

## 2022-05-31 DIAGNOSIS — D689 Coagulation defect, unspecified: Secondary | ICD-10-CM | POA: Diagnosis not present

## 2022-05-31 DIAGNOSIS — D509 Iron deficiency anemia, unspecified: Secondary | ICD-10-CM | POA: Diagnosis not present

## 2022-06-02 DIAGNOSIS — N2581 Secondary hyperparathyroidism of renal origin: Secondary | ICD-10-CM | POA: Diagnosis not present

## 2022-06-02 DIAGNOSIS — D631 Anemia in chronic kidney disease: Secondary | ICD-10-CM | POA: Diagnosis not present

## 2022-06-02 DIAGNOSIS — E876 Hypokalemia: Secondary | ICD-10-CM | POA: Diagnosis not present

## 2022-06-02 DIAGNOSIS — N186 End stage renal disease: Secondary | ICD-10-CM | POA: Diagnosis not present

## 2022-06-02 DIAGNOSIS — D509 Iron deficiency anemia, unspecified: Secondary | ICD-10-CM | POA: Diagnosis not present

## 2022-06-02 DIAGNOSIS — Z992 Dependence on renal dialysis: Secondary | ICD-10-CM | POA: Diagnosis not present

## 2022-06-02 DIAGNOSIS — D689 Coagulation defect, unspecified: Secondary | ICD-10-CM | POA: Diagnosis not present

## 2022-06-05 DIAGNOSIS — D509 Iron deficiency anemia, unspecified: Secondary | ICD-10-CM | POA: Diagnosis not present

## 2022-06-05 DIAGNOSIS — E876 Hypokalemia: Secondary | ICD-10-CM | POA: Diagnosis not present

## 2022-06-05 DIAGNOSIS — Z992 Dependence on renal dialysis: Secondary | ICD-10-CM | POA: Diagnosis not present

## 2022-06-05 DIAGNOSIS — N186 End stage renal disease: Secondary | ICD-10-CM | POA: Diagnosis not present

## 2022-06-05 DIAGNOSIS — N2581 Secondary hyperparathyroidism of renal origin: Secondary | ICD-10-CM | POA: Diagnosis not present

## 2022-06-05 DIAGNOSIS — D689 Coagulation defect, unspecified: Secondary | ICD-10-CM | POA: Diagnosis not present

## 2022-06-05 DIAGNOSIS — D631 Anemia in chronic kidney disease: Secondary | ICD-10-CM | POA: Diagnosis not present

## 2022-06-07 DIAGNOSIS — E876 Hypokalemia: Secondary | ICD-10-CM | POA: Diagnosis not present

## 2022-06-07 DIAGNOSIS — D509 Iron deficiency anemia, unspecified: Secondary | ICD-10-CM | POA: Diagnosis not present

## 2022-06-07 DIAGNOSIS — D689 Coagulation defect, unspecified: Secondary | ICD-10-CM | POA: Diagnosis not present

## 2022-06-07 DIAGNOSIS — D631 Anemia in chronic kidney disease: Secondary | ICD-10-CM | POA: Diagnosis not present

## 2022-06-07 DIAGNOSIS — N186 End stage renal disease: Secondary | ICD-10-CM | POA: Diagnosis not present

## 2022-06-07 DIAGNOSIS — Z992 Dependence on renal dialysis: Secondary | ICD-10-CM | POA: Diagnosis not present

## 2022-06-07 DIAGNOSIS — N2581 Secondary hyperparathyroidism of renal origin: Secondary | ICD-10-CM | POA: Diagnosis not present

## 2022-06-07 NOTE — Progress Notes (Signed)
Remote pacemaker transmission.   

## 2022-06-09 DIAGNOSIS — D631 Anemia in chronic kidney disease: Secondary | ICD-10-CM | POA: Diagnosis not present

## 2022-06-09 DIAGNOSIS — D509 Iron deficiency anemia, unspecified: Secondary | ICD-10-CM | POA: Diagnosis not present

## 2022-06-09 DIAGNOSIS — I129 Hypertensive chronic kidney disease with stage 1 through stage 4 chronic kidney disease, or unspecified chronic kidney disease: Secondary | ICD-10-CM | POA: Diagnosis not present

## 2022-06-09 DIAGNOSIS — D689 Coagulation defect, unspecified: Secondary | ICD-10-CM | POA: Diagnosis not present

## 2022-06-09 DIAGNOSIS — N2581 Secondary hyperparathyroidism of renal origin: Secondary | ICD-10-CM | POA: Diagnosis not present

## 2022-06-09 DIAGNOSIS — N186 End stage renal disease: Secondary | ICD-10-CM | POA: Diagnosis not present

## 2022-06-09 DIAGNOSIS — Z992 Dependence on renal dialysis: Secondary | ICD-10-CM | POA: Diagnosis not present

## 2022-06-09 DIAGNOSIS — E876 Hypokalemia: Secondary | ICD-10-CM | POA: Diagnosis not present

## 2022-06-12 DIAGNOSIS — N186 End stage renal disease: Secondary | ICD-10-CM | POA: Diagnosis not present

## 2022-06-12 DIAGNOSIS — Z992 Dependence on renal dialysis: Secondary | ICD-10-CM | POA: Diagnosis not present

## 2022-06-12 DIAGNOSIS — D631 Anemia in chronic kidney disease: Secondary | ICD-10-CM | POA: Diagnosis not present

## 2022-06-12 DIAGNOSIS — E876 Hypokalemia: Secondary | ICD-10-CM | POA: Diagnosis not present

## 2022-06-12 DIAGNOSIS — N2581 Secondary hyperparathyroidism of renal origin: Secondary | ICD-10-CM | POA: Diagnosis not present

## 2022-06-12 DIAGNOSIS — D689 Coagulation defect, unspecified: Secondary | ICD-10-CM | POA: Diagnosis not present

## 2022-06-14 DIAGNOSIS — Z992 Dependence on renal dialysis: Secondary | ICD-10-CM | POA: Diagnosis not present

## 2022-06-14 DIAGNOSIS — D631 Anemia in chronic kidney disease: Secondary | ICD-10-CM | POA: Diagnosis not present

## 2022-06-14 DIAGNOSIS — N2581 Secondary hyperparathyroidism of renal origin: Secondary | ICD-10-CM | POA: Diagnosis not present

## 2022-06-14 DIAGNOSIS — E876 Hypokalemia: Secondary | ICD-10-CM | POA: Diagnosis not present

## 2022-06-14 DIAGNOSIS — D689 Coagulation defect, unspecified: Secondary | ICD-10-CM | POA: Diagnosis not present

## 2022-06-14 DIAGNOSIS — N186 End stage renal disease: Secondary | ICD-10-CM | POA: Diagnosis not present

## 2022-06-16 DIAGNOSIS — E876 Hypokalemia: Secondary | ICD-10-CM | POA: Diagnosis not present

## 2022-06-16 DIAGNOSIS — N186 End stage renal disease: Secondary | ICD-10-CM | POA: Diagnosis not present

## 2022-06-16 DIAGNOSIS — N2581 Secondary hyperparathyroidism of renal origin: Secondary | ICD-10-CM | POA: Diagnosis not present

## 2022-06-16 DIAGNOSIS — Z992 Dependence on renal dialysis: Secondary | ICD-10-CM | POA: Diagnosis not present

## 2022-06-16 DIAGNOSIS — D689 Coagulation defect, unspecified: Secondary | ICD-10-CM | POA: Diagnosis not present

## 2022-06-16 DIAGNOSIS — D631 Anemia in chronic kidney disease: Secondary | ICD-10-CM | POA: Diagnosis not present

## 2022-06-19 DIAGNOSIS — D689 Coagulation defect, unspecified: Secondary | ICD-10-CM | POA: Diagnosis not present

## 2022-06-19 DIAGNOSIS — Z992 Dependence on renal dialysis: Secondary | ICD-10-CM | POA: Diagnosis not present

## 2022-06-19 DIAGNOSIS — E876 Hypokalemia: Secondary | ICD-10-CM | POA: Diagnosis not present

## 2022-06-19 DIAGNOSIS — N2581 Secondary hyperparathyroidism of renal origin: Secondary | ICD-10-CM | POA: Diagnosis not present

## 2022-06-19 DIAGNOSIS — N186 End stage renal disease: Secondary | ICD-10-CM | POA: Diagnosis not present

## 2022-06-19 DIAGNOSIS — D631 Anemia in chronic kidney disease: Secondary | ICD-10-CM | POA: Diagnosis not present

## 2022-06-21 DIAGNOSIS — D689 Coagulation defect, unspecified: Secondary | ICD-10-CM | POA: Diagnosis not present

## 2022-06-21 DIAGNOSIS — D631 Anemia in chronic kidney disease: Secondary | ICD-10-CM | POA: Diagnosis not present

## 2022-06-21 DIAGNOSIS — Z992 Dependence on renal dialysis: Secondary | ICD-10-CM | POA: Diagnosis not present

## 2022-06-21 DIAGNOSIS — N2581 Secondary hyperparathyroidism of renal origin: Secondary | ICD-10-CM | POA: Diagnosis not present

## 2022-06-21 DIAGNOSIS — N186 End stage renal disease: Secondary | ICD-10-CM | POA: Diagnosis not present

## 2022-06-21 DIAGNOSIS — E876 Hypokalemia: Secondary | ICD-10-CM | POA: Diagnosis not present

## 2022-06-23 DIAGNOSIS — N2581 Secondary hyperparathyroidism of renal origin: Secondary | ICD-10-CM | POA: Diagnosis not present

## 2022-06-23 DIAGNOSIS — D689 Coagulation defect, unspecified: Secondary | ICD-10-CM | POA: Diagnosis not present

## 2022-06-23 DIAGNOSIS — Z992 Dependence on renal dialysis: Secondary | ICD-10-CM | POA: Diagnosis not present

## 2022-06-23 DIAGNOSIS — N186 End stage renal disease: Secondary | ICD-10-CM | POA: Diagnosis not present

## 2022-06-23 DIAGNOSIS — D631 Anemia in chronic kidney disease: Secondary | ICD-10-CM | POA: Diagnosis not present

## 2022-06-23 DIAGNOSIS — E876 Hypokalemia: Secondary | ICD-10-CM | POA: Diagnosis not present

## 2022-06-26 DIAGNOSIS — E876 Hypokalemia: Secondary | ICD-10-CM | POA: Diagnosis not present

## 2022-06-26 DIAGNOSIS — Z992 Dependence on renal dialysis: Secondary | ICD-10-CM | POA: Diagnosis not present

## 2022-06-26 DIAGNOSIS — D689 Coagulation defect, unspecified: Secondary | ICD-10-CM | POA: Diagnosis not present

## 2022-06-26 DIAGNOSIS — N186 End stage renal disease: Secondary | ICD-10-CM | POA: Diagnosis not present

## 2022-06-26 DIAGNOSIS — N2581 Secondary hyperparathyroidism of renal origin: Secondary | ICD-10-CM | POA: Diagnosis not present

## 2022-06-26 DIAGNOSIS — D631 Anemia in chronic kidney disease: Secondary | ICD-10-CM | POA: Diagnosis not present

## 2022-06-28 DIAGNOSIS — N186 End stage renal disease: Secondary | ICD-10-CM | POA: Diagnosis not present

## 2022-06-28 DIAGNOSIS — E876 Hypokalemia: Secondary | ICD-10-CM | POA: Diagnosis not present

## 2022-06-28 DIAGNOSIS — D631 Anemia in chronic kidney disease: Secondary | ICD-10-CM | POA: Diagnosis not present

## 2022-06-28 DIAGNOSIS — Z992 Dependence on renal dialysis: Secondary | ICD-10-CM | POA: Diagnosis not present

## 2022-06-28 DIAGNOSIS — N2581 Secondary hyperparathyroidism of renal origin: Secondary | ICD-10-CM | POA: Diagnosis not present

## 2022-06-28 DIAGNOSIS — D689 Coagulation defect, unspecified: Secondary | ICD-10-CM | POA: Diagnosis not present

## 2022-06-30 DIAGNOSIS — D689 Coagulation defect, unspecified: Secondary | ICD-10-CM | POA: Diagnosis not present

## 2022-06-30 DIAGNOSIS — N186 End stage renal disease: Secondary | ICD-10-CM | POA: Diagnosis not present

## 2022-06-30 DIAGNOSIS — D631 Anemia in chronic kidney disease: Secondary | ICD-10-CM | POA: Diagnosis not present

## 2022-06-30 DIAGNOSIS — Z992 Dependence on renal dialysis: Secondary | ICD-10-CM | POA: Diagnosis not present

## 2022-06-30 DIAGNOSIS — N2581 Secondary hyperparathyroidism of renal origin: Secondary | ICD-10-CM | POA: Diagnosis not present

## 2022-06-30 DIAGNOSIS — E876 Hypokalemia: Secondary | ICD-10-CM | POA: Diagnosis not present

## 2022-07-03 DIAGNOSIS — D689 Coagulation defect, unspecified: Secondary | ICD-10-CM | POA: Diagnosis not present

## 2022-07-03 DIAGNOSIS — E876 Hypokalemia: Secondary | ICD-10-CM | POA: Diagnosis not present

## 2022-07-03 DIAGNOSIS — N186 End stage renal disease: Secondary | ICD-10-CM | POA: Diagnosis not present

## 2022-07-03 DIAGNOSIS — Z992 Dependence on renal dialysis: Secondary | ICD-10-CM | POA: Diagnosis not present

## 2022-07-03 DIAGNOSIS — D631 Anemia in chronic kidney disease: Secondary | ICD-10-CM | POA: Diagnosis not present

## 2022-07-03 DIAGNOSIS — N2581 Secondary hyperparathyroidism of renal origin: Secondary | ICD-10-CM | POA: Diagnosis not present

## 2022-07-05 DIAGNOSIS — N186 End stage renal disease: Secondary | ICD-10-CM | POA: Diagnosis not present

## 2022-07-05 DIAGNOSIS — D631 Anemia in chronic kidney disease: Secondary | ICD-10-CM | POA: Diagnosis not present

## 2022-07-05 DIAGNOSIS — E876 Hypokalemia: Secondary | ICD-10-CM | POA: Diagnosis not present

## 2022-07-05 DIAGNOSIS — N2581 Secondary hyperparathyroidism of renal origin: Secondary | ICD-10-CM | POA: Diagnosis not present

## 2022-07-05 DIAGNOSIS — Z992 Dependence on renal dialysis: Secondary | ICD-10-CM | POA: Diagnosis not present

## 2022-07-05 DIAGNOSIS — D689 Coagulation defect, unspecified: Secondary | ICD-10-CM | POA: Diagnosis not present

## 2022-07-07 DIAGNOSIS — E876 Hypokalemia: Secondary | ICD-10-CM | POA: Diagnosis not present

## 2022-07-07 DIAGNOSIS — N2581 Secondary hyperparathyroidism of renal origin: Secondary | ICD-10-CM | POA: Diagnosis not present

## 2022-07-07 DIAGNOSIS — D631 Anemia in chronic kidney disease: Secondary | ICD-10-CM | POA: Diagnosis not present

## 2022-07-07 DIAGNOSIS — D689 Coagulation defect, unspecified: Secondary | ICD-10-CM | POA: Diagnosis not present

## 2022-07-07 DIAGNOSIS — Z992 Dependence on renal dialysis: Secondary | ICD-10-CM | POA: Diagnosis not present

## 2022-07-07 DIAGNOSIS — N186 End stage renal disease: Secondary | ICD-10-CM | POA: Diagnosis not present

## 2022-07-09 DIAGNOSIS — I129 Hypertensive chronic kidney disease with stage 1 through stage 4 chronic kidney disease, or unspecified chronic kidney disease: Secondary | ICD-10-CM | POA: Diagnosis not present

## 2022-07-09 DIAGNOSIS — Z992 Dependence on renal dialysis: Secondary | ICD-10-CM | POA: Diagnosis not present

## 2022-07-09 DIAGNOSIS — N186 End stage renal disease: Secondary | ICD-10-CM | POA: Diagnosis not present

## 2022-07-10 DIAGNOSIS — D689 Coagulation defect, unspecified: Secondary | ICD-10-CM | POA: Diagnosis not present

## 2022-07-10 DIAGNOSIS — Z992 Dependence on renal dialysis: Secondary | ICD-10-CM | POA: Diagnosis not present

## 2022-07-10 DIAGNOSIS — D631 Anemia in chronic kidney disease: Secondary | ICD-10-CM | POA: Diagnosis not present

## 2022-07-10 DIAGNOSIS — E876 Hypokalemia: Secondary | ICD-10-CM | POA: Diagnosis not present

## 2022-07-10 DIAGNOSIS — N2581 Secondary hyperparathyroidism of renal origin: Secondary | ICD-10-CM | POA: Diagnosis not present

## 2022-07-10 DIAGNOSIS — N186 End stage renal disease: Secondary | ICD-10-CM | POA: Diagnosis not present

## 2022-07-10 DIAGNOSIS — D509 Iron deficiency anemia, unspecified: Secondary | ICD-10-CM | POA: Diagnosis not present

## 2022-07-12 DIAGNOSIS — D689 Coagulation defect, unspecified: Secondary | ICD-10-CM | POA: Diagnosis not present

## 2022-07-12 DIAGNOSIS — N186 End stage renal disease: Secondary | ICD-10-CM | POA: Diagnosis not present

## 2022-07-12 DIAGNOSIS — E876 Hypokalemia: Secondary | ICD-10-CM | POA: Diagnosis not present

## 2022-07-12 DIAGNOSIS — Z992 Dependence on renal dialysis: Secondary | ICD-10-CM | POA: Diagnosis not present

## 2022-07-12 DIAGNOSIS — N2581 Secondary hyperparathyroidism of renal origin: Secondary | ICD-10-CM | POA: Diagnosis not present

## 2022-07-12 DIAGNOSIS — D509 Iron deficiency anemia, unspecified: Secondary | ICD-10-CM | POA: Diagnosis not present

## 2022-07-12 DIAGNOSIS — D631 Anemia in chronic kidney disease: Secondary | ICD-10-CM | POA: Diagnosis not present

## 2022-07-14 DIAGNOSIS — E876 Hypokalemia: Secondary | ICD-10-CM | POA: Diagnosis not present

## 2022-07-14 DIAGNOSIS — D689 Coagulation defect, unspecified: Secondary | ICD-10-CM | POA: Diagnosis not present

## 2022-07-14 DIAGNOSIS — Z992 Dependence on renal dialysis: Secondary | ICD-10-CM | POA: Diagnosis not present

## 2022-07-14 DIAGNOSIS — D509 Iron deficiency anemia, unspecified: Secondary | ICD-10-CM | POA: Diagnosis not present

## 2022-07-14 DIAGNOSIS — N186 End stage renal disease: Secondary | ICD-10-CM | POA: Diagnosis not present

## 2022-07-14 DIAGNOSIS — D631 Anemia in chronic kidney disease: Secondary | ICD-10-CM | POA: Diagnosis not present

## 2022-07-14 DIAGNOSIS — N2581 Secondary hyperparathyroidism of renal origin: Secondary | ICD-10-CM | POA: Diagnosis not present

## 2022-07-17 DIAGNOSIS — E876 Hypokalemia: Secondary | ICD-10-CM | POA: Diagnosis not present

## 2022-07-17 DIAGNOSIS — D631 Anemia in chronic kidney disease: Secondary | ICD-10-CM | POA: Diagnosis not present

## 2022-07-17 DIAGNOSIS — Z992 Dependence on renal dialysis: Secondary | ICD-10-CM | POA: Diagnosis not present

## 2022-07-17 DIAGNOSIS — N2581 Secondary hyperparathyroidism of renal origin: Secondary | ICD-10-CM | POA: Diagnosis not present

## 2022-07-17 DIAGNOSIS — N186 End stage renal disease: Secondary | ICD-10-CM | POA: Diagnosis not present

## 2022-07-17 DIAGNOSIS — D689 Coagulation defect, unspecified: Secondary | ICD-10-CM | POA: Diagnosis not present

## 2022-07-17 DIAGNOSIS — D509 Iron deficiency anemia, unspecified: Secondary | ICD-10-CM | POA: Diagnosis not present

## 2022-07-19 DIAGNOSIS — D689 Coagulation defect, unspecified: Secondary | ICD-10-CM | POA: Diagnosis not present

## 2022-07-19 DIAGNOSIS — Z992 Dependence on renal dialysis: Secondary | ICD-10-CM | POA: Diagnosis not present

## 2022-07-19 DIAGNOSIS — E876 Hypokalemia: Secondary | ICD-10-CM | POA: Diagnosis not present

## 2022-07-19 DIAGNOSIS — D631 Anemia in chronic kidney disease: Secondary | ICD-10-CM | POA: Diagnosis not present

## 2022-07-19 DIAGNOSIS — N186 End stage renal disease: Secondary | ICD-10-CM | POA: Diagnosis not present

## 2022-07-19 DIAGNOSIS — N2581 Secondary hyperparathyroidism of renal origin: Secondary | ICD-10-CM | POA: Diagnosis not present

## 2022-07-19 DIAGNOSIS — D509 Iron deficiency anemia, unspecified: Secondary | ICD-10-CM | POA: Diagnosis not present

## 2022-07-20 DIAGNOSIS — I871 Compression of vein: Secondary | ICD-10-CM | POA: Diagnosis not present

## 2022-07-20 DIAGNOSIS — N186 End stage renal disease: Secondary | ICD-10-CM | POA: Diagnosis not present

## 2022-07-20 DIAGNOSIS — Z992 Dependence on renal dialysis: Secondary | ICD-10-CM | POA: Diagnosis not present

## 2022-07-20 DIAGNOSIS — T82858A Stenosis of vascular prosthetic devices, implants and grafts, initial encounter: Secondary | ICD-10-CM | POA: Diagnosis not present

## 2022-07-21 DIAGNOSIS — D689 Coagulation defect, unspecified: Secondary | ICD-10-CM | POA: Diagnosis not present

## 2022-07-21 DIAGNOSIS — N186 End stage renal disease: Secondary | ICD-10-CM | POA: Diagnosis not present

## 2022-07-21 DIAGNOSIS — D631 Anemia in chronic kidney disease: Secondary | ICD-10-CM | POA: Diagnosis not present

## 2022-07-21 DIAGNOSIS — Z992 Dependence on renal dialysis: Secondary | ICD-10-CM | POA: Diagnosis not present

## 2022-07-21 DIAGNOSIS — E876 Hypokalemia: Secondary | ICD-10-CM | POA: Diagnosis not present

## 2022-07-21 DIAGNOSIS — N2581 Secondary hyperparathyroidism of renal origin: Secondary | ICD-10-CM | POA: Diagnosis not present

## 2022-07-21 DIAGNOSIS — D509 Iron deficiency anemia, unspecified: Secondary | ICD-10-CM | POA: Diagnosis not present

## 2022-07-24 DIAGNOSIS — N186 End stage renal disease: Secondary | ICD-10-CM | POA: Diagnosis not present

## 2022-07-24 DIAGNOSIS — D689 Coagulation defect, unspecified: Secondary | ICD-10-CM | POA: Diagnosis not present

## 2022-07-24 DIAGNOSIS — D509 Iron deficiency anemia, unspecified: Secondary | ICD-10-CM | POA: Diagnosis not present

## 2022-07-24 DIAGNOSIS — E876 Hypokalemia: Secondary | ICD-10-CM | POA: Diagnosis not present

## 2022-07-24 DIAGNOSIS — N2581 Secondary hyperparathyroidism of renal origin: Secondary | ICD-10-CM | POA: Diagnosis not present

## 2022-07-24 DIAGNOSIS — Z992 Dependence on renal dialysis: Secondary | ICD-10-CM | POA: Diagnosis not present

## 2022-07-24 DIAGNOSIS — D631 Anemia in chronic kidney disease: Secondary | ICD-10-CM | POA: Diagnosis not present

## 2022-07-26 DIAGNOSIS — D631 Anemia in chronic kidney disease: Secondary | ICD-10-CM | POA: Diagnosis not present

## 2022-07-26 DIAGNOSIS — Z992 Dependence on renal dialysis: Secondary | ICD-10-CM | POA: Diagnosis not present

## 2022-07-26 DIAGNOSIS — N2581 Secondary hyperparathyroidism of renal origin: Secondary | ICD-10-CM | POA: Diagnosis not present

## 2022-07-26 DIAGNOSIS — N186 End stage renal disease: Secondary | ICD-10-CM | POA: Diagnosis not present

## 2022-07-26 DIAGNOSIS — D689 Coagulation defect, unspecified: Secondary | ICD-10-CM | POA: Diagnosis not present

## 2022-07-26 DIAGNOSIS — D509 Iron deficiency anemia, unspecified: Secondary | ICD-10-CM | POA: Diagnosis not present

## 2022-07-26 DIAGNOSIS — E876 Hypokalemia: Secondary | ICD-10-CM | POA: Diagnosis not present

## 2022-07-28 DIAGNOSIS — D509 Iron deficiency anemia, unspecified: Secondary | ICD-10-CM | POA: Diagnosis not present

## 2022-07-28 DIAGNOSIS — N186 End stage renal disease: Secondary | ICD-10-CM | POA: Diagnosis not present

## 2022-07-28 DIAGNOSIS — D689 Coagulation defect, unspecified: Secondary | ICD-10-CM | POA: Diagnosis not present

## 2022-07-28 DIAGNOSIS — Z992 Dependence on renal dialysis: Secondary | ICD-10-CM | POA: Diagnosis not present

## 2022-07-28 DIAGNOSIS — E876 Hypokalemia: Secondary | ICD-10-CM | POA: Diagnosis not present

## 2022-07-28 DIAGNOSIS — N2581 Secondary hyperparathyroidism of renal origin: Secondary | ICD-10-CM | POA: Diagnosis not present

## 2022-07-28 DIAGNOSIS — D631 Anemia in chronic kidney disease: Secondary | ICD-10-CM | POA: Diagnosis not present

## 2022-07-31 DIAGNOSIS — D631 Anemia in chronic kidney disease: Secondary | ICD-10-CM | POA: Diagnosis not present

## 2022-07-31 DIAGNOSIS — N2581 Secondary hyperparathyroidism of renal origin: Secondary | ICD-10-CM | POA: Diagnosis not present

## 2022-07-31 DIAGNOSIS — D689 Coagulation defect, unspecified: Secondary | ICD-10-CM | POA: Diagnosis not present

## 2022-07-31 DIAGNOSIS — N186 End stage renal disease: Secondary | ICD-10-CM | POA: Diagnosis not present

## 2022-07-31 DIAGNOSIS — D509 Iron deficiency anemia, unspecified: Secondary | ICD-10-CM | POA: Diagnosis not present

## 2022-07-31 DIAGNOSIS — Z992 Dependence on renal dialysis: Secondary | ICD-10-CM | POA: Diagnosis not present

## 2022-07-31 DIAGNOSIS — E876 Hypokalemia: Secondary | ICD-10-CM | POA: Diagnosis not present

## 2022-08-02 DIAGNOSIS — Z992 Dependence on renal dialysis: Secondary | ICD-10-CM | POA: Diagnosis not present

## 2022-08-02 DIAGNOSIS — D689 Coagulation defect, unspecified: Secondary | ICD-10-CM | POA: Diagnosis not present

## 2022-08-02 DIAGNOSIS — D631 Anemia in chronic kidney disease: Secondary | ICD-10-CM | POA: Diagnosis not present

## 2022-08-02 DIAGNOSIS — N186 End stage renal disease: Secondary | ICD-10-CM | POA: Diagnosis not present

## 2022-08-02 DIAGNOSIS — D509 Iron deficiency anemia, unspecified: Secondary | ICD-10-CM | POA: Diagnosis not present

## 2022-08-02 DIAGNOSIS — E876 Hypokalemia: Secondary | ICD-10-CM | POA: Diagnosis not present

## 2022-08-02 DIAGNOSIS — N2581 Secondary hyperparathyroidism of renal origin: Secondary | ICD-10-CM | POA: Diagnosis not present

## 2022-08-04 DIAGNOSIS — Z992 Dependence on renal dialysis: Secondary | ICD-10-CM | POA: Diagnosis not present

## 2022-08-04 DIAGNOSIS — D689 Coagulation defect, unspecified: Secondary | ICD-10-CM | POA: Diagnosis not present

## 2022-08-04 DIAGNOSIS — E876 Hypokalemia: Secondary | ICD-10-CM | POA: Diagnosis not present

## 2022-08-04 DIAGNOSIS — N186 End stage renal disease: Secondary | ICD-10-CM | POA: Diagnosis not present

## 2022-08-04 DIAGNOSIS — D631 Anemia in chronic kidney disease: Secondary | ICD-10-CM | POA: Diagnosis not present

## 2022-08-04 DIAGNOSIS — N2581 Secondary hyperparathyroidism of renal origin: Secondary | ICD-10-CM | POA: Diagnosis not present

## 2022-08-04 DIAGNOSIS — D509 Iron deficiency anemia, unspecified: Secondary | ICD-10-CM | POA: Diagnosis not present

## 2022-08-07 DIAGNOSIS — N2581 Secondary hyperparathyroidism of renal origin: Secondary | ICD-10-CM | POA: Diagnosis not present

## 2022-08-07 DIAGNOSIS — D509 Iron deficiency anemia, unspecified: Secondary | ICD-10-CM | POA: Diagnosis not present

## 2022-08-07 DIAGNOSIS — D631 Anemia in chronic kidney disease: Secondary | ICD-10-CM | POA: Diagnosis not present

## 2022-08-07 DIAGNOSIS — Z992 Dependence on renal dialysis: Secondary | ICD-10-CM | POA: Diagnosis not present

## 2022-08-07 DIAGNOSIS — E876 Hypokalemia: Secondary | ICD-10-CM | POA: Diagnosis not present

## 2022-08-07 DIAGNOSIS — D689 Coagulation defect, unspecified: Secondary | ICD-10-CM | POA: Diagnosis not present

## 2022-08-07 DIAGNOSIS — N186 End stage renal disease: Secondary | ICD-10-CM | POA: Diagnosis not present

## 2022-08-09 DIAGNOSIS — Z992 Dependence on renal dialysis: Secondary | ICD-10-CM | POA: Diagnosis not present

## 2022-08-09 DIAGNOSIS — D509 Iron deficiency anemia, unspecified: Secondary | ICD-10-CM | POA: Diagnosis not present

## 2022-08-09 DIAGNOSIS — N2581 Secondary hyperparathyroidism of renal origin: Secondary | ICD-10-CM | POA: Diagnosis not present

## 2022-08-09 DIAGNOSIS — D631 Anemia in chronic kidney disease: Secondary | ICD-10-CM | POA: Diagnosis not present

## 2022-08-09 DIAGNOSIS — D689 Coagulation defect, unspecified: Secondary | ICD-10-CM | POA: Diagnosis not present

## 2022-08-09 DIAGNOSIS — E876 Hypokalemia: Secondary | ICD-10-CM | POA: Diagnosis not present

## 2022-08-09 DIAGNOSIS — I129 Hypertensive chronic kidney disease with stage 1 through stage 4 chronic kidney disease, or unspecified chronic kidney disease: Secondary | ICD-10-CM | POA: Diagnosis not present

## 2022-08-09 DIAGNOSIS — N186 End stage renal disease: Secondary | ICD-10-CM | POA: Diagnosis not present

## 2022-08-11 DIAGNOSIS — N186 End stage renal disease: Secondary | ICD-10-CM | POA: Diagnosis not present

## 2022-08-11 DIAGNOSIS — D509 Iron deficiency anemia, unspecified: Secondary | ICD-10-CM | POA: Diagnosis not present

## 2022-08-11 DIAGNOSIS — D631 Anemia in chronic kidney disease: Secondary | ICD-10-CM | POA: Diagnosis not present

## 2022-08-11 DIAGNOSIS — E876 Hypokalemia: Secondary | ICD-10-CM | POA: Diagnosis not present

## 2022-08-11 DIAGNOSIS — D689 Coagulation defect, unspecified: Secondary | ICD-10-CM | POA: Diagnosis not present

## 2022-08-11 DIAGNOSIS — N2581 Secondary hyperparathyroidism of renal origin: Secondary | ICD-10-CM | POA: Diagnosis not present

## 2022-08-11 DIAGNOSIS — Z992 Dependence on renal dialysis: Secondary | ICD-10-CM | POA: Diagnosis not present

## 2022-08-14 DIAGNOSIS — N186 End stage renal disease: Secondary | ICD-10-CM | POA: Diagnosis not present

## 2022-08-14 DIAGNOSIS — Z992 Dependence on renal dialysis: Secondary | ICD-10-CM | POA: Diagnosis not present

## 2022-08-14 DIAGNOSIS — N2581 Secondary hyperparathyroidism of renal origin: Secondary | ICD-10-CM | POA: Diagnosis not present

## 2022-08-14 DIAGNOSIS — D509 Iron deficiency anemia, unspecified: Secondary | ICD-10-CM | POA: Diagnosis not present

## 2022-08-14 DIAGNOSIS — D689 Coagulation defect, unspecified: Secondary | ICD-10-CM | POA: Diagnosis not present

## 2022-08-14 DIAGNOSIS — D631 Anemia in chronic kidney disease: Secondary | ICD-10-CM | POA: Diagnosis not present

## 2022-08-14 DIAGNOSIS — E876 Hypokalemia: Secondary | ICD-10-CM | POA: Diagnosis not present

## 2022-08-16 ENCOUNTER — Ambulatory Visit: Payer: Medicare Other

## 2022-08-16 DIAGNOSIS — D509 Iron deficiency anemia, unspecified: Secondary | ICD-10-CM | POA: Diagnosis not present

## 2022-08-16 DIAGNOSIS — I442 Atrioventricular block, complete: Secondary | ICD-10-CM

## 2022-08-16 DIAGNOSIS — N2581 Secondary hyperparathyroidism of renal origin: Secondary | ICD-10-CM | POA: Diagnosis not present

## 2022-08-16 DIAGNOSIS — E876 Hypokalemia: Secondary | ICD-10-CM | POA: Diagnosis not present

## 2022-08-16 DIAGNOSIS — Z992 Dependence on renal dialysis: Secondary | ICD-10-CM | POA: Diagnosis not present

## 2022-08-16 DIAGNOSIS — D631 Anemia in chronic kidney disease: Secondary | ICD-10-CM | POA: Diagnosis not present

## 2022-08-16 DIAGNOSIS — D689 Coagulation defect, unspecified: Secondary | ICD-10-CM | POA: Diagnosis not present

## 2022-08-16 DIAGNOSIS — N186 End stage renal disease: Secondary | ICD-10-CM | POA: Diagnosis not present

## 2022-08-18 DIAGNOSIS — N186 End stage renal disease: Secondary | ICD-10-CM | POA: Diagnosis not present

## 2022-08-18 DIAGNOSIS — N2581 Secondary hyperparathyroidism of renal origin: Secondary | ICD-10-CM | POA: Diagnosis not present

## 2022-08-18 DIAGNOSIS — E876 Hypokalemia: Secondary | ICD-10-CM | POA: Diagnosis not present

## 2022-08-18 DIAGNOSIS — D509 Iron deficiency anemia, unspecified: Secondary | ICD-10-CM | POA: Diagnosis not present

## 2022-08-18 DIAGNOSIS — D631 Anemia in chronic kidney disease: Secondary | ICD-10-CM | POA: Diagnosis not present

## 2022-08-18 DIAGNOSIS — Z992 Dependence on renal dialysis: Secondary | ICD-10-CM | POA: Diagnosis not present

## 2022-08-18 DIAGNOSIS — D689 Coagulation defect, unspecified: Secondary | ICD-10-CM | POA: Diagnosis not present

## 2022-08-21 DIAGNOSIS — N186 End stage renal disease: Secondary | ICD-10-CM | POA: Diagnosis not present

## 2022-08-21 DIAGNOSIS — Z992 Dependence on renal dialysis: Secondary | ICD-10-CM | POA: Diagnosis not present

## 2022-08-21 DIAGNOSIS — D631 Anemia in chronic kidney disease: Secondary | ICD-10-CM | POA: Diagnosis not present

## 2022-08-21 DIAGNOSIS — D689 Coagulation defect, unspecified: Secondary | ICD-10-CM | POA: Diagnosis not present

## 2022-08-21 DIAGNOSIS — D509 Iron deficiency anemia, unspecified: Secondary | ICD-10-CM | POA: Diagnosis not present

## 2022-08-21 DIAGNOSIS — N2581 Secondary hyperparathyroidism of renal origin: Secondary | ICD-10-CM | POA: Diagnosis not present

## 2022-08-21 DIAGNOSIS — E876 Hypokalemia: Secondary | ICD-10-CM | POA: Diagnosis not present

## 2022-08-23 DIAGNOSIS — E876 Hypokalemia: Secondary | ICD-10-CM | POA: Diagnosis not present

## 2022-08-23 DIAGNOSIS — D631 Anemia in chronic kidney disease: Secondary | ICD-10-CM | POA: Diagnosis not present

## 2022-08-23 DIAGNOSIS — N2581 Secondary hyperparathyroidism of renal origin: Secondary | ICD-10-CM | POA: Diagnosis not present

## 2022-08-23 DIAGNOSIS — D509 Iron deficiency anemia, unspecified: Secondary | ICD-10-CM | POA: Diagnosis not present

## 2022-08-23 DIAGNOSIS — N186 End stage renal disease: Secondary | ICD-10-CM | POA: Diagnosis not present

## 2022-08-23 DIAGNOSIS — D689 Coagulation defect, unspecified: Secondary | ICD-10-CM | POA: Diagnosis not present

## 2022-08-23 DIAGNOSIS — Z992 Dependence on renal dialysis: Secondary | ICD-10-CM | POA: Diagnosis not present

## 2022-08-25 DIAGNOSIS — N186 End stage renal disease: Secondary | ICD-10-CM | POA: Diagnosis not present

## 2022-08-25 DIAGNOSIS — D689 Coagulation defect, unspecified: Secondary | ICD-10-CM | POA: Diagnosis not present

## 2022-08-25 DIAGNOSIS — D631 Anemia in chronic kidney disease: Secondary | ICD-10-CM | POA: Diagnosis not present

## 2022-08-25 DIAGNOSIS — Z992 Dependence on renal dialysis: Secondary | ICD-10-CM | POA: Diagnosis not present

## 2022-08-25 DIAGNOSIS — N2581 Secondary hyperparathyroidism of renal origin: Secondary | ICD-10-CM | POA: Diagnosis not present

## 2022-08-25 DIAGNOSIS — E876 Hypokalemia: Secondary | ICD-10-CM | POA: Diagnosis not present

## 2022-08-25 DIAGNOSIS — D509 Iron deficiency anemia, unspecified: Secondary | ICD-10-CM | POA: Diagnosis not present

## 2022-08-28 DIAGNOSIS — D689 Coagulation defect, unspecified: Secondary | ICD-10-CM | POA: Diagnosis not present

## 2022-08-28 DIAGNOSIS — Z992 Dependence on renal dialysis: Secondary | ICD-10-CM | POA: Diagnosis not present

## 2022-08-28 DIAGNOSIS — E876 Hypokalemia: Secondary | ICD-10-CM | POA: Diagnosis not present

## 2022-08-28 DIAGNOSIS — N186 End stage renal disease: Secondary | ICD-10-CM | POA: Diagnosis not present

## 2022-08-28 DIAGNOSIS — D631 Anemia in chronic kidney disease: Secondary | ICD-10-CM | POA: Diagnosis not present

## 2022-08-28 DIAGNOSIS — N2581 Secondary hyperparathyroidism of renal origin: Secondary | ICD-10-CM | POA: Diagnosis not present

## 2022-08-28 DIAGNOSIS — D509 Iron deficiency anemia, unspecified: Secondary | ICD-10-CM | POA: Diagnosis not present

## 2022-08-30 DIAGNOSIS — N2581 Secondary hyperparathyroidism of renal origin: Secondary | ICD-10-CM | POA: Diagnosis not present

## 2022-08-30 DIAGNOSIS — Z992 Dependence on renal dialysis: Secondary | ICD-10-CM | POA: Diagnosis not present

## 2022-08-30 DIAGNOSIS — D689 Coagulation defect, unspecified: Secondary | ICD-10-CM | POA: Diagnosis not present

## 2022-08-30 DIAGNOSIS — N186 End stage renal disease: Secondary | ICD-10-CM | POA: Diagnosis not present

## 2022-08-30 DIAGNOSIS — D631 Anemia in chronic kidney disease: Secondary | ICD-10-CM | POA: Diagnosis not present

## 2022-08-30 DIAGNOSIS — D509 Iron deficiency anemia, unspecified: Secondary | ICD-10-CM | POA: Diagnosis not present

## 2022-08-30 DIAGNOSIS — E876 Hypokalemia: Secondary | ICD-10-CM | POA: Diagnosis not present

## 2022-09-01 DIAGNOSIS — N2581 Secondary hyperparathyroidism of renal origin: Secondary | ICD-10-CM | POA: Diagnosis not present

## 2022-09-01 DIAGNOSIS — D509 Iron deficiency anemia, unspecified: Secondary | ICD-10-CM | POA: Diagnosis not present

## 2022-09-01 DIAGNOSIS — D689 Coagulation defect, unspecified: Secondary | ICD-10-CM | POA: Diagnosis not present

## 2022-09-01 DIAGNOSIS — E876 Hypokalemia: Secondary | ICD-10-CM | POA: Diagnosis not present

## 2022-09-01 DIAGNOSIS — D631 Anemia in chronic kidney disease: Secondary | ICD-10-CM | POA: Diagnosis not present

## 2022-09-01 DIAGNOSIS — Z992 Dependence on renal dialysis: Secondary | ICD-10-CM | POA: Diagnosis not present

## 2022-09-01 DIAGNOSIS — N186 End stage renal disease: Secondary | ICD-10-CM | POA: Diagnosis not present

## 2022-09-01 NOTE — Progress Notes (Signed)
Remote pacemaker transmission.   

## 2022-09-04 DIAGNOSIS — N2581 Secondary hyperparathyroidism of renal origin: Secondary | ICD-10-CM | POA: Diagnosis not present

## 2022-09-04 DIAGNOSIS — D509 Iron deficiency anemia, unspecified: Secondary | ICD-10-CM | POA: Diagnosis not present

## 2022-09-04 DIAGNOSIS — Z992 Dependence on renal dialysis: Secondary | ICD-10-CM | POA: Diagnosis not present

## 2022-09-04 DIAGNOSIS — N186 End stage renal disease: Secondary | ICD-10-CM | POA: Diagnosis not present

## 2022-09-04 DIAGNOSIS — D631 Anemia in chronic kidney disease: Secondary | ICD-10-CM | POA: Diagnosis not present

## 2022-09-04 DIAGNOSIS — D689 Coagulation defect, unspecified: Secondary | ICD-10-CM | POA: Diagnosis not present

## 2022-09-04 DIAGNOSIS — E876 Hypokalemia: Secondary | ICD-10-CM | POA: Diagnosis not present

## 2022-09-06 DIAGNOSIS — D689 Coagulation defect, unspecified: Secondary | ICD-10-CM | POA: Diagnosis not present

## 2022-09-06 DIAGNOSIS — Z992 Dependence on renal dialysis: Secondary | ICD-10-CM | POA: Diagnosis not present

## 2022-09-06 DIAGNOSIS — D509 Iron deficiency anemia, unspecified: Secondary | ICD-10-CM | POA: Diagnosis not present

## 2022-09-06 DIAGNOSIS — N2581 Secondary hyperparathyroidism of renal origin: Secondary | ICD-10-CM | POA: Diagnosis not present

## 2022-09-06 DIAGNOSIS — N186 End stage renal disease: Secondary | ICD-10-CM | POA: Diagnosis not present

## 2022-09-06 DIAGNOSIS — D631 Anemia in chronic kidney disease: Secondary | ICD-10-CM | POA: Diagnosis not present

## 2022-09-06 DIAGNOSIS — E876 Hypokalemia: Secondary | ICD-10-CM | POA: Diagnosis not present

## 2022-09-08 DIAGNOSIS — N2581 Secondary hyperparathyroidism of renal origin: Secondary | ICD-10-CM | POA: Diagnosis not present

## 2022-09-08 DIAGNOSIS — E876 Hypokalemia: Secondary | ICD-10-CM | POA: Diagnosis not present

## 2022-09-08 DIAGNOSIS — D509 Iron deficiency anemia, unspecified: Secondary | ICD-10-CM | POA: Diagnosis not present

## 2022-09-08 DIAGNOSIS — D631 Anemia in chronic kidney disease: Secondary | ICD-10-CM | POA: Diagnosis not present

## 2022-09-08 DIAGNOSIS — Z992 Dependence on renal dialysis: Secondary | ICD-10-CM | POA: Diagnosis not present

## 2022-09-08 DIAGNOSIS — N186 End stage renal disease: Secondary | ICD-10-CM | POA: Diagnosis not present

## 2022-09-08 DIAGNOSIS — D689 Coagulation defect, unspecified: Secondary | ICD-10-CM | POA: Diagnosis not present

## 2022-09-09 DIAGNOSIS — N186 End stage renal disease: Secondary | ICD-10-CM | POA: Diagnosis not present

## 2022-09-09 DIAGNOSIS — Z992 Dependence on renal dialysis: Secondary | ICD-10-CM | POA: Diagnosis not present

## 2022-09-09 DIAGNOSIS — I129 Hypertensive chronic kidney disease with stage 1 through stage 4 chronic kidney disease, or unspecified chronic kidney disease: Secondary | ICD-10-CM | POA: Diagnosis not present

## 2022-09-11 DIAGNOSIS — D631 Anemia in chronic kidney disease: Secondary | ICD-10-CM | POA: Diagnosis not present

## 2022-09-11 DIAGNOSIS — D689 Coagulation defect, unspecified: Secondary | ICD-10-CM | POA: Diagnosis not present

## 2022-09-11 DIAGNOSIS — N2581 Secondary hyperparathyroidism of renal origin: Secondary | ICD-10-CM | POA: Diagnosis not present

## 2022-09-11 DIAGNOSIS — Z992 Dependence on renal dialysis: Secondary | ICD-10-CM | POA: Diagnosis not present

## 2022-09-11 DIAGNOSIS — N186 End stage renal disease: Secondary | ICD-10-CM | POA: Diagnosis not present

## 2022-09-11 DIAGNOSIS — D509 Iron deficiency anemia, unspecified: Secondary | ICD-10-CM | POA: Diagnosis not present

## 2022-09-11 DIAGNOSIS — E876 Hypokalemia: Secondary | ICD-10-CM | POA: Diagnosis not present

## 2022-09-13 DIAGNOSIS — D509 Iron deficiency anemia, unspecified: Secondary | ICD-10-CM | POA: Diagnosis not present

## 2022-09-13 DIAGNOSIS — N2581 Secondary hyperparathyroidism of renal origin: Secondary | ICD-10-CM | POA: Diagnosis not present

## 2022-09-13 DIAGNOSIS — D689 Coagulation defect, unspecified: Secondary | ICD-10-CM | POA: Diagnosis not present

## 2022-09-13 DIAGNOSIS — N186 End stage renal disease: Secondary | ICD-10-CM | POA: Diagnosis not present

## 2022-09-13 DIAGNOSIS — E876 Hypokalemia: Secondary | ICD-10-CM | POA: Diagnosis not present

## 2022-09-13 DIAGNOSIS — D631 Anemia in chronic kidney disease: Secondary | ICD-10-CM | POA: Diagnosis not present

## 2022-09-13 DIAGNOSIS — Z992 Dependence on renal dialysis: Secondary | ICD-10-CM | POA: Diagnosis not present

## 2022-09-15 DIAGNOSIS — D689 Coagulation defect, unspecified: Secondary | ICD-10-CM | POA: Diagnosis not present

## 2022-09-15 DIAGNOSIS — D631 Anemia in chronic kidney disease: Secondary | ICD-10-CM | POA: Diagnosis not present

## 2022-09-15 DIAGNOSIS — E876 Hypokalemia: Secondary | ICD-10-CM | POA: Diagnosis not present

## 2022-09-15 DIAGNOSIS — N2581 Secondary hyperparathyroidism of renal origin: Secondary | ICD-10-CM | POA: Diagnosis not present

## 2022-09-15 DIAGNOSIS — N186 End stage renal disease: Secondary | ICD-10-CM | POA: Diagnosis not present

## 2022-09-15 DIAGNOSIS — Z992 Dependence on renal dialysis: Secondary | ICD-10-CM | POA: Diagnosis not present

## 2022-09-15 DIAGNOSIS — D509 Iron deficiency anemia, unspecified: Secondary | ICD-10-CM | POA: Diagnosis not present

## 2022-09-18 DIAGNOSIS — E876 Hypokalemia: Secondary | ICD-10-CM | POA: Diagnosis not present

## 2022-09-18 DIAGNOSIS — N186 End stage renal disease: Secondary | ICD-10-CM | POA: Diagnosis not present

## 2022-09-18 DIAGNOSIS — Z992 Dependence on renal dialysis: Secondary | ICD-10-CM | POA: Diagnosis not present

## 2022-09-18 DIAGNOSIS — D509 Iron deficiency anemia, unspecified: Secondary | ICD-10-CM | POA: Diagnosis not present

## 2022-09-18 DIAGNOSIS — D631 Anemia in chronic kidney disease: Secondary | ICD-10-CM | POA: Diagnosis not present

## 2022-09-18 DIAGNOSIS — N2581 Secondary hyperparathyroidism of renal origin: Secondary | ICD-10-CM | POA: Diagnosis not present

## 2022-09-18 DIAGNOSIS — D689 Coagulation defect, unspecified: Secondary | ICD-10-CM | POA: Diagnosis not present

## 2022-09-20 DIAGNOSIS — Z992 Dependence on renal dialysis: Secondary | ICD-10-CM | POA: Diagnosis not present

## 2022-09-20 DIAGNOSIS — D509 Iron deficiency anemia, unspecified: Secondary | ICD-10-CM | POA: Diagnosis not present

## 2022-09-20 DIAGNOSIS — E876 Hypokalemia: Secondary | ICD-10-CM | POA: Diagnosis not present

## 2022-09-20 DIAGNOSIS — N186 End stage renal disease: Secondary | ICD-10-CM | POA: Diagnosis not present

## 2022-09-20 DIAGNOSIS — D689 Coagulation defect, unspecified: Secondary | ICD-10-CM | POA: Diagnosis not present

## 2022-09-20 DIAGNOSIS — D631 Anemia in chronic kidney disease: Secondary | ICD-10-CM | POA: Diagnosis not present

## 2022-09-20 DIAGNOSIS — N2581 Secondary hyperparathyroidism of renal origin: Secondary | ICD-10-CM | POA: Diagnosis not present

## 2022-09-22 DIAGNOSIS — D689 Coagulation defect, unspecified: Secondary | ICD-10-CM | POA: Diagnosis not present

## 2022-09-22 DIAGNOSIS — N186 End stage renal disease: Secondary | ICD-10-CM | POA: Diagnosis not present

## 2022-09-22 DIAGNOSIS — I871 Compression of vein: Secondary | ICD-10-CM | POA: Diagnosis not present

## 2022-09-22 DIAGNOSIS — Z992 Dependence on renal dialysis: Secondary | ICD-10-CM | POA: Diagnosis not present

## 2022-09-22 DIAGNOSIS — D509 Iron deficiency anemia, unspecified: Secondary | ICD-10-CM | POA: Diagnosis not present

## 2022-09-22 DIAGNOSIS — T82858A Stenosis of vascular prosthetic devices, implants and grafts, initial encounter: Secondary | ICD-10-CM | POA: Diagnosis not present

## 2022-09-22 DIAGNOSIS — N2581 Secondary hyperparathyroidism of renal origin: Secondary | ICD-10-CM | POA: Diagnosis not present

## 2022-09-22 DIAGNOSIS — E876 Hypokalemia: Secondary | ICD-10-CM | POA: Diagnosis not present

## 2022-09-22 DIAGNOSIS — D631 Anemia in chronic kidney disease: Secondary | ICD-10-CM | POA: Diagnosis not present

## 2022-09-23 DIAGNOSIS — D689 Coagulation defect, unspecified: Secondary | ICD-10-CM | POA: Diagnosis not present

## 2022-09-23 DIAGNOSIS — D509 Iron deficiency anemia, unspecified: Secondary | ICD-10-CM | POA: Diagnosis not present

## 2022-09-23 DIAGNOSIS — D631 Anemia in chronic kidney disease: Secondary | ICD-10-CM | POA: Diagnosis not present

## 2022-09-23 DIAGNOSIS — Z992 Dependence on renal dialysis: Secondary | ICD-10-CM | POA: Diagnosis not present

## 2022-09-23 DIAGNOSIS — N186 End stage renal disease: Secondary | ICD-10-CM | POA: Diagnosis not present

## 2022-09-23 DIAGNOSIS — N2581 Secondary hyperparathyroidism of renal origin: Secondary | ICD-10-CM | POA: Diagnosis not present

## 2022-09-23 DIAGNOSIS — E876 Hypokalemia: Secondary | ICD-10-CM | POA: Diagnosis not present

## 2022-09-25 DIAGNOSIS — D689 Coagulation defect, unspecified: Secondary | ICD-10-CM | POA: Diagnosis not present

## 2022-09-25 DIAGNOSIS — D509 Iron deficiency anemia, unspecified: Secondary | ICD-10-CM | POA: Diagnosis not present

## 2022-09-25 DIAGNOSIS — D631 Anemia in chronic kidney disease: Secondary | ICD-10-CM | POA: Diagnosis not present

## 2022-09-25 DIAGNOSIS — N2581 Secondary hyperparathyroidism of renal origin: Secondary | ICD-10-CM | POA: Diagnosis not present

## 2022-09-25 DIAGNOSIS — Z992 Dependence on renal dialysis: Secondary | ICD-10-CM | POA: Diagnosis not present

## 2022-09-25 DIAGNOSIS — N186 End stage renal disease: Secondary | ICD-10-CM | POA: Diagnosis not present

## 2022-09-25 DIAGNOSIS — E876 Hypokalemia: Secondary | ICD-10-CM | POA: Diagnosis not present

## 2022-09-27 DIAGNOSIS — N2581 Secondary hyperparathyroidism of renal origin: Secondary | ICD-10-CM | POA: Diagnosis not present

## 2022-09-27 DIAGNOSIS — N186 End stage renal disease: Secondary | ICD-10-CM | POA: Diagnosis not present

## 2022-09-27 DIAGNOSIS — Z992 Dependence on renal dialysis: Secondary | ICD-10-CM | POA: Diagnosis not present

## 2022-09-27 DIAGNOSIS — D689 Coagulation defect, unspecified: Secondary | ICD-10-CM | POA: Diagnosis not present

## 2022-09-27 DIAGNOSIS — D631 Anemia in chronic kidney disease: Secondary | ICD-10-CM | POA: Diagnosis not present

## 2022-09-27 DIAGNOSIS — D509 Iron deficiency anemia, unspecified: Secondary | ICD-10-CM | POA: Diagnosis not present

## 2022-09-27 DIAGNOSIS — E876 Hypokalemia: Secondary | ICD-10-CM | POA: Diagnosis not present

## 2022-09-29 DIAGNOSIS — D509 Iron deficiency anemia, unspecified: Secondary | ICD-10-CM | POA: Diagnosis not present

## 2022-09-29 DIAGNOSIS — D689 Coagulation defect, unspecified: Secondary | ICD-10-CM | POA: Diagnosis not present

## 2022-09-29 DIAGNOSIS — N2581 Secondary hyperparathyroidism of renal origin: Secondary | ICD-10-CM | POA: Diagnosis not present

## 2022-09-29 DIAGNOSIS — D631 Anemia in chronic kidney disease: Secondary | ICD-10-CM | POA: Diagnosis not present

## 2022-09-29 DIAGNOSIS — Z992 Dependence on renal dialysis: Secondary | ICD-10-CM | POA: Diagnosis not present

## 2022-09-29 DIAGNOSIS — N186 End stage renal disease: Secondary | ICD-10-CM | POA: Diagnosis not present

## 2022-09-29 DIAGNOSIS — E876 Hypokalemia: Secondary | ICD-10-CM | POA: Diagnosis not present

## 2022-10-02 DIAGNOSIS — D631 Anemia in chronic kidney disease: Secondary | ICD-10-CM | POA: Diagnosis not present

## 2022-10-02 DIAGNOSIS — Z992 Dependence on renal dialysis: Secondary | ICD-10-CM | POA: Diagnosis not present

## 2022-10-02 DIAGNOSIS — D689 Coagulation defect, unspecified: Secondary | ICD-10-CM | POA: Diagnosis not present

## 2022-10-02 DIAGNOSIS — D509 Iron deficiency anemia, unspecified: Secondary | ICD-10-CM | POA: Diagnosis not present

## 2022-10-02 DIAGNOSIS — N186 End stage renal disease: Secondary | ICD-10-CM | POA: Diagnosis not present

## 2022-10-02 DIAGNOSIS — E876 Hypokalemia: Secondary | ICD-10-CM | POA: Diagnosis not present

## 2022-10-02 DIAGNOSIS — N2581 Secondary hyperparathyroidism of renal origin: Secondary | ICD-10-CM | POA: Diagnosis not present

## 2022-10-04 DIAGNOSIS — D631 Anemia in chronic kidney disease: Secondary | ICD-10-CM | POA: Diagnosis not present

## 2022-10-04 DIAGNOSIS — D689 Coagulation defect, unspecified: Secondary | ICD-10-CM | POA: Diagnosis not present

## 2022-10-04 DIAGNOSIS — N186 End stage renal disease: Secondary | ICD-10-CM | POA: Diagnosis not present

## 2022-10-04 DIAGNOSIS — N2581 Secondary hyperparathyroidism of renal origin: Secondary | ICD-10-CM | POA: Diagnosis not present

## 2022-10-04 DIAGNOSIS — E876 Hypokalemia: Secondary | ICD-10-CM | POA: Diagnosis not present

## 2022-10-04 DIAGNOSIS — D509 Iron deficiency anemia, unspecified: Secondary | ICD-10-CM | POA: Diagnosis not present

## 2022-10-04 DIAGNOSIS — Z992 Dependence on renal dialysis: Secondary | ICD-10-CM | POA: Diagnosis not present

## 2022-10-05 ENCOUNTER — Other Ambulatory Visit: Payer: Self-pay | Admitting: Internal Medicine

## 2022-10-06 DIAGNOSIS — D689 Coagulation defect, unspecified: Secondary | ICD-10-CM | POA: Diagnosis not present

## 2022-10-06 DIAGNOSIS — D631 Anemia in chronic kidney disease: Secondary | ICD-10-CM | POA: Diagnosis not present

## 2022-10-06 DIAGNOSIS — N186 End stage renal disease: Secondary | ICD-10-CM | POA: Diagnosis not present

## 2022-10-06 DIAGNOSIS — D509 Iron deficiency anemia, unspecified: Secondary | ICD-10-CM | POA: Diagnosis not present

## 2022-10-06 DIAGNOSIS — E876 Hypokalemia: Secondary | ICD-10-CM | POA: Diagnosis not present

## 2022-10-06 DIAGNOSIS — N2581 Secondary hyperparathyroidism of renal origin: Secondary | ICD-10-CM | POA: Diagnosis not present

## 2022-10-06 DIAGNOSIS — Z992 Dependence on renal dialysis: Secondary | ICD-10-CM | POA: Diagnosis not present

## 2022-10-09 DIAGNOSIS — E876 Hypokalemia: Secondary | ICD-10-CM | POA: Diagnosis not present

## 2022-10-09 DIAGNOSIS — Z992 Dependence on renal dialysis: Secondary | ICD-10-CM | POA: Diagnosis not present

## 2022-10-09 DIAGNOSIS — I129 Hypertensive chronic kidney disease with stage 1 through stage 4 chronic kidney disease, or unspecified chronic kidney disease: Secondary | ICD-10-CM | POA: Diagnosis not present

## 2022-10-09 DIAGNOSIS — D509 Iron deficiency anemia, unspecified: Secondary | ICD-10-CM | POA: Diagnosis not present

## 2022-10-09 DIAGNOSIS — N186 End stage renal disease: Secondary | ICD-10-CM | POA: Diagnosis not present

## 2022-10-09 DIAGNOSIS — D631 Anemia in chronic kidney disease: Secondary | ICD-10-CM | POA: Diagnosis not present

## 2022-10-09 DIAGNOSIS — N2581 Secondary hyperparathyroidism of renal origin: Secondary | ICD-10-CM | POA: Diagnosis not present

## 2022-10-09 DIAGNOSIS — D689 Coagulation defect, unspecified: Secondary | ICD-10-CM | POA: Diagnosis not present

## 2022-10-10 DIAGNOSIS — T82858A Stenosis of vascular prosthetic devices, implants and grafts, initial encounter: Secondary | ICD-10-CM | POA: Diagnosis not present

## 2022-10-10 DIAGNOSIS — N186 End stage renal disease: Secondary | ICD-10-CM | POA: Diagnosis not present

## 2022-10-10 DIAGNOSIS — Z992 Dependence on renal dialysis: Secondary | ICD-10-CM | POA: Diagnosis not present

## 2022-10-10 DIAGNOSIS — I871 Compression of vein: Secondary | ICD-10-CM | POA: Diagnosis not present

## 2022-10-11 DIAGNOSIS — R197 Diarrhea, unspecified: Secondary | ICD-10-CM | POA: Diagnosis not present

## 2022-10-11 DIAGNOSIS — Z992 Dependence on renal dialysis: Secondary | ICD-10-CM | POA: Diagnosis not present

## 2022-10-11 DIAGNOSIS — N186 End stage renal disease: Secondary | ICD-10-CM | POA: Diagnosis not present

## 2022-10-11 DIAGNOSIS — D509 Iron deficiency anemia, unspecified: Secondary | ICD-10-CM | POA: Diagnosis not present

## 2022-10-11 DIAGNOSIS — E876 Hypokalemia: Secondary | ICD-10-CM | POA: Diagnosis not present

## 2022-10-11 DIAGNOSIS — D631 Anemia in chronic kidney disease: Secondary | ICD-10-CM | POA: Diagnosis not present

## 2022-10-11 DIAGNOSIS — Z23 Encounter for immunization: Secondary | ICD-10-CM | POA: Diagnosis not present

## 2022-10-11 DIAGNOSIS — D689 Coagulation defect, unspecified: Secondary | ICD-10-CM | POA: Diagnosis not present

## 2022-10-11 DIAGNOSIS — N2581 Secondary hyperparathyroidism of renal origin: Secondary | ICD-10-CM | POA: Diagnosis not present

## 2022-10-13 DIAGNOSIS — N2581 Secondary hyperparathyroidism of renal origin: Secondary | ICD-10-CM | POA: Diagnosis not present

## 2022-10-13 DIAGNOSIS — Z992 Dependence on renal dialysis: Secondary | ICD-10-CM | POA: Diagnosis not present

## 2022-10-13 DIAGNOSIS — N186 End stage renal disease: Secondary | ICD-10-CM | POA: Diagnosis not present

## 2022-10-13 DIAGNOSIS — Z23 Encounter for immunization: Secondary | ICD-10-CM | POA: Diagnosis not present

## 2022-10-13 DIAGNOSIS — E876 Hypokalemia: Secondary | ICD-10-CM | POA: Diagnosis not present

## 2022-10-13 DIAGNOSIS — R197 Diarrhea, unspecified: Secondary | ICD-10-CM | POA: Diagnosis not present

## 2022-10-13 DIAGNOSIS — D689 Coagulation defect, unspecified: Secondary | ICD-10-CM | POA: Diagnosis not present

## 2022-10-13 DIAGNOSIS — D509 Iron deficiency anemia, unspecified: Secondary | ICD-10-CM | POA: Diagnosis not present

## 2022-10-13 DIAGNOSIS — D631 Anemia in chronic kidney disease: Secondary | ICD-10-CM | POA: Diagnosis not present

## 2022-10-16 DIAGNOSIS — D689 Coagulation defect, unspecified: Secondary | ICD-10-CM | POA: Diagnosis not present

## 2022-10-16 DIAGNOSIS — N2581 Secondary hyperparathyroidism of renal origin: Secondary | ICD-10-CM | POA: Diagnosis not present

## 2022-10-16 DIAGNOSIS — E876 Hypokalemia: Secondary | ICD-10-CM | POA: Diagnosis not present

## 2022-10-16 DIAGNOSIS — N186 End stage renal disease: Secondary | ICD-10-CM | POA: Diagnosis not present

## 2022-10-16 DIAGNOSIS — D631 Anemia in chronic kidney disease: Secondary | ICD-10-CM | POA: Diagnosis not present

## 2022-10-16 DIAGNOSIS — R197 Diarrhea, unspecified: Secondary | ICD-10-CM | POA: Diagnosis not present

## 2022-10-16 DIAGNOSIS — Z992 Dependence on renal dialysis: Secondary | ICD-10-CM | POA: Diagnosis not present

## 2022-10-16 DIAGNOSIS — Z23 Encounter for immunization: Secondary | ICD-10-CM | POA: Diagnosis not present

## 2022-10-16 DIAGNOSIS — D509 Iron deficiency anemia, unspecified: Secondary | ICD-10-CM | POA: Diagnosis not present

## 2022-10-17 ENCOUNTER — Encounter: Payer: Self-pay | Admitting: Internal Medicine

## 2022-10-17 ENCOUNTER — Ambulatory Visit: Payer: Medicare Other | Attending: Internal Medicine | Admitting: Internal Medicine

## 2022-10-17 VITALS — BP 110/56 | HR 64 | Wt 167.4 lb

## 2022-10-17 DIAGNOSIS — Z950 Presence of cardiac pacemaker: Secondary | ICD-10-CM | POA: Diagnosis not present

## 2022-10-17 DIAGNOSIS — I442 Atrioventricular block, complete: Secondary | ICD-10-CM

## 2022-10-17 NOTE — Progress Notes (Signed)
Patient Care Team: Merri Brunette, MD as PCP - General (Internal Medicine) Lennette Bihari, MD as PCP - Cardiology (Cardiology) Center, [facility]   HPI  Paul Price is a 81 y.o. 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     He has a history of ischemic heart disease with prior bypass surgery and PCI-stenting LAD 2012   Paroxysmal atrial fibrillation and is anticoagulated with Xarelto>>Apixoban    Without significant bleeding  The patient denies chest pain,  , nocturnal dyspnea  orthopnea or peripheral edem.  There have been no palpitations, lightheadedness or syncope.  Variable exercise tolerance but has noted no significant improvement since the r return of sinus rhythm in the last few days  Admitted 1/22 with Covid infection and GI bleeding.  Hospital course notable for UTI secondary to staph aureus without bacteremia.  TEE recommended by Dr. Jillyn Hidden was negative  Progressive renal insufficiency with AV fistula placement and then renal replacement therapy with hemodialysis started 1/22   DATE TEST EF   4/18 MYOVIEW  47 % Small scar w/o ischemia   12/18 Echo   40-45% % LVH mod  12/21 Echo  40-45%   1/22 TEE 40-45% No evidence of infection               Date Cr K Hgb  12/18 2.26  11.2  11/19 1.85 4.2 10.7  9/20 2.47 4.5 12.1  2/22 3.58 4.2 8.7  1/23 4.4      Thromboembolic risk factors ( age -75, HTN-1, CHF-1) for a CHADSVASc Score of >=4 \ Records and Results Reviewed   Past Medical History:  Diagnosis Date   Arthritis    "shoulders" (09/07/2016)   Atrial fibrillation (HCC)    BPH (benign prostatic hyperplasia)    Chronic kidney disease (CKD), stage III (moderate) (HCC)    Coronary artery disease    DDD (degenerative disc disease), cervical    DVT (deep venous thrombosis) (HCC) 12/2013   LLE   GERD (gastroesophageal reflux disease)    Gout    High cholesterol    History  of blood transfusion 09/06/2016   2 u PRBC   History of scarlet fever 1940s   History of stress test 06/2011   No significant ischemia, this is a low risk scan. Clinical correlation recommended Abnormal myocardial perfusion study.   Hx of echocardiogram 05/2009   EF 40-45%, he did have mild annular calcification with mild-to-moderate MR and mild TR as well as aortic valve sclerosis. Estimated RV systolic pressure was 21 mm.   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   Neck pain    "not chronic" (09/07/2016)   Obesity    OSA on CPAP    Presence of permanent cardiac pacemaker    RBBB    Renal insufficiency    Right renal mass    Spinal stenosis of lumbar region     Past Surgical History:  Procedure Laterality Date   APPENDECTOMY     AV FISTULA PLACEMENT Right 02/09/2020   Procedure: ARTERIOVENOUS (AV) FISTULA  CREATION RIGHT UPPER EXTREMITY;  Surgeon: Cephus Shelling, MD;  Location: [facility] OR;  Service: Vascular;  Laterality: Right;   BASCILIC VEIN TRANSPOSITION Right 03/30/2020   Procedure: RIGHT SECOND STAGE BASCILIC VEIN TRANSPOSITION;  Surgeon: Maeola Harman, MD;  Location: [facility] OR;  Service: Vascular;  Laterality: Right;   CARDIAC CATHETERIZATION  2009   he was found to have a atretic LIMA graft to his LAD and had a patent vein graft supplying his diagonal vessel. At that time, he had 70% narrowing in his LAD beyond a diagonal vessel which was initially treated medically.   COLONOSCOPY WITH PROPOFOL Left 09/09/2016   Procedure: COLONOSCOPY WITH PROPOFOL;  Surgeon: Willis Modena, MD;  Location: [facility] ENDOSCOPY;  Service: Endoscopy;  Laterality: Left;   CORONARY ANGIOPLASTY WITH STENT PLACEMENT  April 2012   LAD DES   CORONARY ARTERY BYPASS GRAFT  1992   with LIMA to the LAD and diagonal.   CYSTOSCOPY N/A 03/31/2019   Procedure: CYSTOSCOPY FLEXIBLE;  Surgeon: Crista Elliot, MD;  Location: [facility] ORS;  Service:  Urology;  Laterality: N/A;   CYSTOSCOPY WITH URETHRAL DILATATION N/A 05/21/2019   Procedure: CYSTOSCOPY WITH URETHRAL BALLOON DILATATION;  Surgeon: Crista Elliot, MD;  Location: [facility] ORS;  Service: Urology;  Laterality: N/A;   ESOPHAGOGASTRODUODENOSCOPY N/A 11/16/2019   Procedure: ESOPHAGOGASTRODUODENOSCOPY (EGD);  Surgeon: Charlott Rakes, MD;  Location: [facility] ENDOSCOPY;  Service: Endoscopy;  Laterality: N/A;   ESOPHAGOGASTRODUODENOSCOPY (EGD) WITH PROPOFOL Left 09/08/2016   Procedure: ESOPHAGOGASTRODUODENOSCOPY (EGD) WITH PROPOFOL;  Surgeon: Kerin Salen, MD;  Location: [facility] ENDOSCOPY;  Service: Gastroenterology;  Laterality: Left;   EXCHANGE OF A DIALYSIS CATHETER Right 02/09/2020   Procedure: EXCHANGE OF A DIALYSIS CATHETER;  Surgeon: Cephus Shelling, MD;  Location: [facility] OR;  Service: Vascular;  Laterality: Right;   GIVENS CAPSULE STUDY N/A 11/16/2019   Procedure: GIVENS CAPSULE STUDY;  Surgeon: Charlott Rakes, MD;  Location[facility] ENDOSCOPY;  Service: Endoscopy;  Laterality: N/A;   INGUINAL HERNIA REPAIR     "don't remeimber which side"   IR FLUORO GUIDE CV LINE RIGHT  02/05/2020   IR US GUIDE VASC ACCESS RIGHT  02/05/2020   LAPAROSCOPIC NEPHRECTOMY Right 03/31/2019   Procedure: RIGHT  HAND ASSIST LAPAROSCOPIC NEPHRECTOMY;  Surgeon: Crista Elliot, MD;  Location: [facility] ORS;  Service: Urology;  Laterality: Right;   PACEMAKER IMPLANT N/A 12/25/2016   Procedure: PACEMAKER IMPLANT;  Surgeon: Duke Salvia, MD;  Location: [facility] INVASIVE CV LAB;  Service: Cardiovascular;  Laterality: N/A;   TEE WITHOUT CARDIOVERSION N/A 02/06/2020   Procedure: TRANSESOPHAGEAL ECHOCARDIOGRAM (TEE);  Surgeon: Chilton Si, MD;  Location: [facility] ENDOSCOPY;  Service: Cardiovascular;  Laterality: N/A;   TONSILLECTOMY      Current Outpatient Medications  Medication Sig Dispense Refill   acetaminophen (TYLENOL) 500 MG tablet Take 500-1,000 mg by mouth every 6 (six) hours as needed for mild pain, moderate pain or  headache.      allopurinol (ZYLOPRIM) 300 MG tablet Take 300 mg by mouth daily.     atorvastatin (LIPITOR) 20 MG tablet TAKE 1 TABLET BY MOUTH DAILY 90 tablet 3   B Complex-C (B-COMPLEX WITH VITAMIN C) tablet Take 1 tablet by mouth daily.     ferric citrate (AURYXIA) 1 GM 210 MG(Fe) tablet Take 210 mg by mouth 3 (three) times daily with meals. Take 2 Pills after each meal     ferrous sulfate 325 (65 FE) MG tablet Take by mouth as needed. During dialysis     metoprolol succinate (TOPROL-XL) 25 MG 24 hr tablet TAKE 1 TABLET BY MOUTH DAILY 90 tablet 0   midodrine (PROAMATINE) 2.5 MG tablet TAKE 1 TABLET BY MOUTH IMMEDIATELY AFTER DIALYSIS THEN ANOTHER TABLET 4 HOURS LATER 90 tablet 0   pantoprazole (PROTONIX) 40  MG tablet Take 40 mg by mouth daily.     Tamsulosin HCl (FLOMAX) 0.4 MG CAPS Take 1 capsule (0.4 mg total) by mouth daily. 30 capsule 0   VITAMIN B1-B12 IJ Inject as directed. Once per week     No current facility-administered medications for this visit.    Allergies  Allergen Reactions   Simvastatin Other (See Comments)    Abdominal cramps   Nitrostat [Nitroglycerin] Other (See Comments)    Causes blood pressure to "bottom out"      Review of Systems negative except from HPI and PMH  Physical Exam BP (!) 110/56   Pulse 64   Wt 167 lb 6.4 oz (75.9 kg)   SpO2 99%   BMI 27.02 kg/m  Well developed and well nourished in no acute distress HENT normal Neck supple with JVP-flat Clear Device pocket well healed; without hematoma or erythema.  There is no tethering  Regular rate and rhythm, no  gallop 2/6 murmur Abd-soft with active BS No Clubbing cyanosis tr edema Skin-warm and dry A & Oriented  Grossly normal sensory and motor function  ECG atrial fib with vpacing at 62 -/16/48  Device function is normal. Programming changes none  See Paceart for details      Assessment and  Plan  Atrial fibrillation persistent/permanent    Complete heart block  Pacemaker  Medtronic    His bundle pacing    Ischemic heart disease with prior bypass ejection fraction 40-45%   Hypertension   Syncope   Heart failure -- chronic systolic/diastolic   Renal Insufficiency RRT     Pt CHB and previous SND and CHB now with atrial fibrillation that is permanent.  Ventricular pacing .  Device function is normal.  With his atrial fibrillation he was previously on Xarelto.  This has been discontinued per the patient.  He is on aspirin for antiplatelet therapy with his ischemic heart disease.  We reviewed the lack of data to support the need of anticoagulation people and renal replacement therapy.  No interval syncope.

## 2022-10-17 NOTE — Patient Instructions (Signed)
Medication Instructions:  Your physician recommends that you continue on your current medications as directed. Please refer to the Current Medication list given to you today.  *If you need a refill on your cardiac medications before your next appointment, please call your pharmacy*   Lab Work: None ordered.  If you have labs (blood work) drawn today and your tests are completely normal, you will receive your results only by: MyChart Message (if you have MyChart) OR A paper copy in the mail If you have any lab test that is abnormal or we need to change your treatment, we will call you to review the results.   Testing/Procedures: None ordered.    Follow-Up: At [facility], you and your health needs are our priority.  As part of our continuing mission to provide you with exceptional heart care, we have created designated Provider Care Teams.  These Care Teams include your primary Cardiologist (physician) and Advanced Practice Providers (APPs -  Physician Assistants and Nurse Practitioners) who all work together to provide you with the care you need, when you need it.  We recommend signing up for the patient portal called "MyChart".  Sign up information is provided on this After Visit Summary.  MyChart is used to connect with patients for Virtual Visits (Telemedicine).  Patients are able to view lab/test results, encounter notes, upcoming appointments, etc.  Non-urgent messages can be sent to your provider as well.   To learn more about what you can do with MyChart, go to [email or web address].    Your next appointment:   14 months with Dr Jimmey Ralph

## 2022-10-18 DIAGNOSIS — E876 Hypokalemia: Secondary | ICD-10-CM | POA: Diagnosis not present

## 2022-10-18 DIAGNOSIS — D689 Coagulation defect, unspecified: Secondary | ICD-10-CM | POA: Diagnosis not present

## 2022-10-18 DIAGNOSIS — N2581 Secondary hyperparathyroidism of renal origin: Secondary | ICD-10-CM | POA: Diagnosis not present

## 2022-10-18 DIAGNOSIS — Z992 Dependence on renal dialysis: Secondary | ICD-10-CM | POA: Diagnosis not present

## 2022-10-18 DIAGNOSIS — Z23 Encounter for immunization: Secondary | ICD-10-CM | POA: Diagnosis not present

## 2022-10-18 DIAGNOSIS — D631 Anemia in chronic kidney disease: Secondary | ICD-10-CM | POA: Diagnosis not present

## 2022-10-18 DIAGNOSIS — N186 End stage renal disease: Secondary | ICD-10-CM | POA: Diagnosis not present

## 2022-10-18 DIAGNOSIS — R197 Diarrhea, unspecified: Secondary | ICD-10-CM | POA: Diagnosis not present

## 2022-10-18 DIAGNOSIS — D509 Iron deficiency anemia, unspecified: Secondary | ICD-10-CM | POA: Diagnosis not present

## 2022-10-20 DIAGNOSIS — Z23 Encounter for immunization: Secondary | ICD-10-CM | POA: Diagnosis not present

## 2022-10-20 DIAGNOSIS — N186 End stage renal disease: Secondary | ICD-10-CM | POA: Diagnosis not present

## 2022-10-20 DIAGNOSIS — Z992 Dependence on renal dialysis: Secondary | ICD-10-CM | POA: Diagnosis not present

## 2022-10-20 DIAGNOSIS — E876 Hypokalemia: Secondary | ICD-10-CM | POA: Diagnosis not present

## 2022-10-20 DIAGNOSIS — D689 Coagulation defect, unspecified: Secondary | ICD-10-CM | POA: Diagnosis not present

## 2022-10-20 DIAGNOSIS — N2581 Secondary hyperparathyroidism of renal origin: Secondary | ICD-10-CM | POA: Diagnosis not present

## 2022-10-20 DIAGNOSIS — D631 Anemia in chronic kidney disease: Secondary | ICD-10-CM | POA: Diagnosis not present

## 2022-10-20 DIAGNOSIS — R197 Diarrhea, unspecified: Secondary | ICD-10-CM | POA: Diagnosis not present

## 2022-10-20 DIAGNOSIS — D509 Iron deficiency anemia, unspecified: Secondary | ICD-10-CM | POA: Diagnosis not present

## 2022-10-23 DIAGNOSIS — N186 End stage renal disease: Secondary | ICD-10-CM | POA: Diagnosis not present

## 2022-10-23 DIAGNOSIS — D631 Anemia in chronic kidney disease: Secondary | ICD-10-CM | POA: Diagnosis not present

## 2022-10-23 DIAGNOSIS — R197 Diarrhea, unspecified: Secondary | ICD-10-CM | POA: Diagnosis not present

## 2022-10-23 DIAGNOSIS — Z23 Encounter for immunization: Secondary | ICD-10-CM | POA: Diagnosis not present

## 2022-10-23 DIAGNOSIS — Z992 Dependence on renal dialysis: Secondary | ICD-10-CM | POA: Diagnosis not present

## 2022-10-23 DIAGNOSIS — N2581 Secondary hyperparathyroidism of renal origin: Secondary | ICD-10-CM | POA: Diagnosis not present

## 2022-10-23 DIAGNOSIS — D509 Iron deficiency anemia, unspecified: Secondary | ICD-10-CM | POA: Diagnosis not present

## 2022-10-23 DIAGNOSIS — E876 Hypokalemia: Secondary | ICD-10-CM | POA: Diagnosis not present

## 2022-10-23 DIAGNOSIS — D689 Coagulation defect, unspecified: Secondary | ICD-10-CM | POA: Diagnosis not present

## 2022-10-25 DIAGNOSIS — Z992 Dependence on renal dialysis: Secondary | ICD-10-CM | POA: Diagnosis not present

## 2022-10-25 DIAGNOSIS — D509 Iron deficiency anemia, unspecified: Secondary | ICD-10-CM | POA: Diagnosis not present

## 2022-10-25 DIAGNOSIS — E876 Hypokalemia: Secondary | ICD-10-CM | POA: Diagnosis not present

## 2022-10-25 DIAGNOSIS — Z23 Encounter for immunization: Secondary | ICD-10-CM | POA: Diagnosis not present

## 2022-10-25 DIAGNOSIS — N2581 Secondary hyperparathyroidism of renal origin: Secondary | ICD-10-CM | POA: Diagnosis not present

## 2022-10-25 DIAGNOSIS — R197 Diarrhea, unspecified: Secondary | ICD-10-CM | POA: Diagnosis not present

## 2022-10-25 DIAGNOSIS — D689 Coagulation defect, unspecified: Secondary | ICD-10-CM | POA: Diagnosis not present

## 2022-10-25 DIAGNOSIS — N186 End stage renal disease: Secondary | ICD-10-CM | POA: Diagnosis not present

## 2022-10-25 DIAGNOSIS — D631 Anemia in chronic kidney disease: Secondary | ICD-10-CM | POA: Diagnosis not present

## 2022-10-27 DIAGNOSIS — D689 Coagulation defect, unspecified: Secondary | ICD-10-CM | POA: Diagnosis not present

## 2022-10-27 DIAGNOSIS — N186 End stage renal disease: Secondary | ICD-10-CM | POA: Diagnosis not present

## 2022-10-27 DIAGNOSIS — R197 Diarrhea, unspecified: Secondary | ICD-10-CM | POA: Diagnosis not present

## 2022-10-27 DIAGNOSIS — Z23 Encounter for immunization: Secondary | ICD-10-CM | POA: Diagnosis not present

## 2022-10-27 DIAGNOSIS — E876 Hypokalemia: Secondary | ICD-10-CM | POA: Diagnosis not present

## 2022-10-27 DIAGNOSIS — Z992 Dependence on renal dialysis: Secondary | ICD-10-CM | POA: Diagnosis not present

## 2022-10-27 DIAGNOSIS — D631 Anemia in chronic kidney disease: Secondary | ICD-10-CM | POA: Diagnosis not present

## 2022-10-27 DIAGNOSIS — D509 Iron deficiency anemia, unspecified: Secondary | ICD-10-CM | POA: Diagnosis not present

## 2022-10-27 DIAGNOSIS — N2581 Secondary hyperparathyroidism of renal origin: Secondary | ICD-10-CM | POA: Diagnosis not present

## 2022-10-30 DIAGNOSIS — D509 Iron deficiency anemia, unspecified: Secondary | ICD-10-CM | POA: Diagnosis not present

## 2022-10-30 DIAGNOSIS — D631 Anemia in chronic kidney disease: Secondary | ICD-10-CM | POA: Diagnosis not present

## 2022-10-30 DIAGNOSIS — Z992 Dependence on renal dialysis: Secondary | ICD-10-CM | POA: Diagnosis not present

## 2022-10-30 DIAGNOSIS — N2581 Secondary hyperparathyroidism of renal origin: Secondary | ICD-10-CM | POA: Diagnosis not present

## 2022-10-30 DIAGNOSIS — E876 Hypokalemia: Secondary | ICD-10-CM | POA: Diagnosis not present

## 2022-10-30 DIAGNOSIS — D689 Coagulation defect, unspecified: Secondary | ICD-10-CM | POA: Diagnosis not present

## 2022-10-30 DIAGNOSIS — R197 Diarrhea, unspecified: Secondary | ICD-10-CM | POA: Diagnosis not present

## 2022-10-30 DIAGNOSIS — Z23 Encounter for immunization: Secondary | ICD-10-CM | POA: Diagnosis not present

## 2022-10-30 DIAGNOSIS — N186 End stage renal disease: Secondary | ICD-10-CM | POA: Diagnosis not present

## 2022-10-31 ENCOUNTER — Ambulatory Visit: Payer: Medicare Other | Attending: Cardiovascular Disease | Admitting: Cardiovascular Disease

## 2022-10-31 ENCOUNTER — Encounter: Payer: Self-pay | Admitting: Cardiovascular Disease

## 2022-10-31 DIAGNOSIS — Z950 Presence of cardiac pacemaker: Secondary | ICD-10-CM

## 2022-10-31 DIAGNOSIS — G4733 Obstructive sleep apnea (adult) (pediatric): Secondary | ICD-10-CM | POA: Diagnosis not present

## 2022-10-31 DIAGNOSIS — N186 End stage renal disease: Secondary | ICD-10-CM

## 2022-10-31 DIAGNOSIS — I4821 Permanent atrial fibrillation: Secondary | ICD-10-CM | POA: Diagnosis not present

## 2022-10-31 DIAGNOSIS — Z951 Presence of aortocoronary bypass graft: Secondary | ICD-10-CM

## 2022-10-31 DIAGNOSIS — I361 Nonrheumatic tricuspid (valve) insufficiency: Secondary | ICD-10-CM

## 2022-10-31 DIAGNOSIS — I340 Nonrheumatic mitral (valve) insufficiency: Secondary | ICD-10-CM | POA: Diagnosis not present

## 2022-10-31 DIAGNOSIS — I2510 Atherosclerotic heart disease of native coronary artery without angina pectoris: Secondary | ICD-10-CM

## 2022-10-31 NOTE — Patient Instructions (Signed)
Medication Instructions:  Your physician recommends that you continue on your current medications as directed. Please refer to the Current Medication list given to you today.  *If you need a refill on your cardiac medications before your next appointment, please call your pharmacy*   Follow-Up: At [facility], you and your health needs are our priority.  As part of our continuing mission to provide you with exceptional heart care, we have created designated Provider Care Teams.  These Care Teams include your primary Cardiologist (physician) and Advanced Practice Providers (APPs -  Physician Assistants and Nurse Practitioners) who all work together to provide you with the care you need, when you need it.  We recommend signing up for the patient portal called "MyChart".  Sign up information is provided on this After Visit Summary.  MyChart is used to connect with patients for Virtual Visits (Telemedicine).  Patients are able to view lab/test results, encounter notes, upcoming appointments, etc.  Non-urgent messages can be sent to your provider as well.   To learn more about what you can do with MyChart, go to [email or web address].    Your next appointment:   12 month(s)  Provider:   To be determined

## 2022-10-31 NOTE — Progress Notes (Signed)
Patient ID: Paul Price, male   DOB: 06-26-1940, 82 y.o.   MRN: [id number]        Primary MD:  Dr. Merri Brunette  HPI: Paul Price is a 82 y.o. male who presents for a 65 month cardiology followup evaluation.   [name] has established CAD and in 1992 underwent CABG revascularization surgery LIMA to the LAD and diagonal. In 2009 he was found to have an atretic LIMA graft to the LAD and a patent vein graft supplying the diagonal vessel. He had 70% narrowing in the LAD beyond the diagonal was treated medically. In April 2012 due to progressive chest pain and scintigraphic evidence for ischemia in the mid distal LAD territory repeat catheterization was performed and he underwent successful stenting to his LAD beyond the diagonal with a 3.0x20 mm Promus DES stent postdilated 3.25 mm. Subsequent, he has remained active and has done well. His last stress test in 2013 showed an apical defect without ischemia.  Additional problems include obstructive sleep apnea on CPAP therapy, moderate obesity, hypertension, history of DVT, and hyperlipidemia.  He sees Dr. Renne Price for primary care.  On November 19, 2012 he saw Paul Price for suspected atrial fibrillation and CHF after having been seen by his primary physician and noted some increasing shortness of breath with activity. A CardioNet monitor  demonstrated  a 4.3 second pause which led to a reduction in his  beta blocker dose  from Lopressor 75 mg twice a day to 25 twice a day. He saw Paul Price on 12/02/2012 at which time he was in sinus rhythm but also was found to have probably 9 beat run of nonsustained VT on monitor which is he was maintained on low-dose beta blocker therapy. Subsequent cardiac monitoring did not show any further episodes of significant bradycardia but he did have one additional 9 beat run of nonsustained PSVT at a rate of 109 beats per minute on 12/17/12. . Laboratory done by Dr. Renne Price on 11/27/2012 showed a potassium of 4.2. His  creatinine was 1.82. BUN 33.  An echo Doppler study done 12/09/2012 showed an estimated ejection fraction at 50-55%. Although no diagnostic regional wall motion abnormalities were definitively identified, this possibility was not completely excluded. There was grade 2 diastolic dysfunction. There is mild left atrial dilatation and mild mitral regurgitation.  A renal ultrasound has  demonstrate bilateral small renal cysts.    In 2015 he was found to have a DVT in his left leg and was started on eliquis by Dr. Renne Price.  He was hospitalized on November 23 through 12/09/2013 and presented with fevers, and sudden onset of severe back pain.  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  Blood cultures were negative.   After 1 week in the hospital, he spent 3 weeks at rehabilitation.  He was treated with 4 weeks of antibiotic therapy and after his hospitalization.  He was followed by Dr. Cliffton Price from infectious disease.  He last saw him in March 2016 and att that time he was felt to be completely cleared cured.    He has renal insufficiency and has been followed by Dr. Desma Price since he had developed acute on chronic kidney injury.  His serum creatinine peaked at 2.8.  Ultimately, this has improved.    When I saw him in follow-up, he continued to note lower extremity edema. He was no longer anticoagulation but has been on Plavix and aspirin.  I scheduled him for follow-up lower extremity  venous ultrasound which showed resolution of his left distal femoral vein and popliteal vein thrombus . His blood pressure has been treated with Toprol-XL 50 mg losartan 100 mg furosemide 40 mg and amlodipine 10 mg.  He is on lipid lowering therapy with atorvastatin.  He takes allopurinol for gout.  There is history of GERD treated with ranitidine.    He admits to using his CPAP with 100% compliance.  He states he has a new machine.  However, recently he has been noticing frequent awakenings.  He denies  awareness of breakthrough snoring.    When I saw him in April 2018, he noticed a change in symptomatology with more progressive exertional dyspnea.  He denied any exertional chest pressure.  However, he has noticed some nonexertional constant chest wall ache.  His shortness of breath was occurring with less activity.  He is unaware of palpitations.  He denies presyncope or syncope.  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   An echo Doppler study on 04/28/2016 showed an EF of 50-55%.  There was grade 2 diastolic dysfunction.  Septal motion.  She showed paradoxical motion.  The left atrium was severely dilated.  There was mild MR.  The RV was mildly dilated, as was the right atrium.  There was mild palmar hypertension with PA pressure 33.  He underwent a nuclear perfusion study to further evaluate his symptoms.  This remained low risk.  Although calculated at 47% EF, visually it appeared to be 55%.  There was a small nonreversible defect in the mid anterior and apical location without ischemia.  He also underwent lower extremity arterial Doppler studies which showed normal ABIs bilaterally and there was no evidence for segmental lower extremity arterial disease. Creatinine was 1.76 on 04/14/2016; Hemoglobin A1c was increased at 6.7.  Lipid studies revealed a TC 128, triglyceride 152, LDL 66, and he has a low HDL level of 32.    He was hospitalized on July 31 through 08/10/2016 and was found to have atrial fibrillation with rapid ventricular response as well as acute renal failure.  His creatinine had risen to 2.9 and improved to 1.9.  With gentle hydration.  His losartan and spironolactone were held.  He was started on Xarelto at discharge.  On August 29 through 09/09/2016.  He was rehospitalized with GI bleed and acute blood loss anemia.  He underwent endoscopy and colonoscopy and GI recommended continuation of  anticoagulation with concomitant Protonix therapy.  He tells me he received 3 units of blood.  During his initial hospitalization on August 1.  An echo Doppler study showed an EF of 45-50% with diffuse hypocontractility.  There was aortic sclerosis without stenosis, mitral annular calcification, and mild LA dilation.  He continues to be on CPAP.   He was hospitalized in December 2018 for dyspnea on exertion accompanied with lightheadedness.  He was noted to have Mobitz type I heart block during his hospitalization and beta-blocker was discontinued due to suspicion of sick sinus syndrome.  He was ultimately evaluated by Dr. Graciela Husbands and underwent pacemaker implantation on December 18.  Subsequently, he has not had any recurrent syncope or presyncope.  A pacemaker device check in March 2019 showed normal device function.  I saw him in June 2019.  Since his pacemaker insertion he denied any recurrent episodes of dizziness.  His weakness has resolved and shortness of breath is improved.  He continues to use CPAP with 100% compliance.  He is unaware of any atrial fibrillation.  He admits to occasional leg swelling.  He denies any chest pain.    He saw Dr. Hurman Horn on December 12, 2018 in follow-up of his pacemaker.  Apparently, for some time prior to that evaluation his amlodipine which had been 7.5 mg was discontinued by Dr. Renne Price due to his concern according to the patient that his blood pressure was getting low.  When evaluated by Dr. Graciela Husbands in December, blood pressure was elevated at 150/78.  He was advised to reinstitute amlodipine at 2.5 mg.  However, the patient has subsequently increase this to 5 mg due to continued blood pressure elevations at home with typical blood pressures greater than 140 systolically.  He has continued to be on anticoagulation therapy with Xarelto with his history of PAF and is on a reduced dose with his renal insufficiency.  He is followed by Dr. Rich Reining for stage IV chronic  kidney disease with creatinine 2.39 in November 2020.  He denies any chest pain PND orthopnea.  I saw him in August 2021 and since his prior evaluation in January 2021, he continued to do well.  Apparently his amlodipine dose was reduced from 5 down to 2.5 mg since his blood pressure has become somewhat low.  He has undergone supplemental iron injections by Dr. Candise Che for his anemia.  He continues to be without anginal symptomatology on amlodipine 2.5 mg, metoprolol succinate 25 mg daily and he continues to be on furosemide 20 mg.  He states his blood pressure at home typically is in the 120s to low 130s.  He recently underwent laboratory on August 12, 2019 which revealed hemoglobin of 8 2 hematocrit 27.0.  He had macrocytic indices.  B12 level was normal.  He has stage IV chronic kidney disease and creatinine had slightly improved to 3.14 from 3.47 in May 2021.  He is followed by Dr. Reynolds Bowl He also had seen Dr. Arbie Cookey.  I saw him on February 19, 2020.  Since his last evaluation with me, he was started on dialysis therapy for end-stage renal disease he had been hospitalized in early January 2022 with Covid pneumonia and was treated with read them severe and steroids.  After discharge was readmitted several days later on February 01, 2020 with upper GI bleeding, AKI and uremic symptoms.  A transesophageal echo showed an EF of 40 to 45% and there was evidence for a left atrial appendage thrombus measuring 1.5 x 0.6 cm.  He was ultimately given clearance by GI to resume anticoagulation and was transitioned from Xarelto to Eliquis.  He was started on hemodialysis on February 06, 2020 and his symptoms improved with dialysis.  He underwent basilic vein fistula placement and conversion to hemodialysis tunneled catheter on January 31 by Dr. Chestine Spore.  He is now undergoing dialysis on Monday Wednesday and Friday.  He has been home a week.  He is having dialysis at [facility].    I saw him on September 06, 2020.   He continues to undergo dialysis on Monday Wednesday and Friday.  He admits to easy bleeding.  He has not had any chest pain.  His last dialysis was this morning.  He denies any chest pain.  He has continued to be on amlodipine 7.5 mg and metoprolol succinate 25 mg daily.  He is on atorvastatin 20 mg for hyperlipidemia.  He has been on Eliquis at 5 mg twice daily dosing.  He has been undergoing every 3 months remote pacemaker checks.    I last saw him on June 30, 2021.  He was having issues with blood pressure getting low during dialysis.  States his blood pressure has dropped to at times 80/40.  He also states that he believes his heart rate may have dropped but he has a pacemaker which should prevent that and is followed by Dr. Graciela Husbands.  On his nondialysis days he feels well but typically following dialysis he often requires a 4-hour nap and essentially the day is over.  Presently he denies chest pain.  He continues to be on atorvastatin 20 mg for hyperlipidemia.  He is not on any medication for blood pressure and he continues to be on low-dose Eliquis 2.5 mg.  He takes Flomax daily and also takes vitamin B and iron injections.    I last saw him, he has been undergoing every 74-month remote pacemaker checks and yearly evaluation with Dr. Graciela Husbands.  He last saw Dr. Graciela Husbands on October 17, 2022 he is in permanent atrial fibrillation and is ventricular paced.  Previously was on Xarelto with self discontinuance.  He has been followed by Dr. Graciela Husbands and is on aspirin for antiplatelet therapy with his ischemic heart disease.  He continues to have low blood pressure following dialysis and takes midodrine following his dialysis episodes.  He continues to be on atorvastatin 20 mg for hyperlipidemia.  He has recently had difficulty with his right arm dialysis needle going from the vein into the artery.  He is scheduled to see Dr. Sherald Hess.  He has been having reduced strength in his right arm subsequent to this episode.   Since I last saw him, his wife died in 27-Sep-2021.  He presents for evaluation.  Past Medical History:  Diagnosis Date   Arthritis    "shoulders" (09/07/2016)   Atrial fibrillation (HCC)    BPH (benign prostatic hyperplasia)    Chronic kidney disease (CKD), stage III (moderate) (HCC)    Coronary artery disease    DDD (degenerative disc disease), cervical    DVT (deep venous thrombosis) (HCC) 12/2013   LLE   GERD (gastroesophageal reflux disease)    Gout    High cholesterol    History of blood transfusion 09/06/2016   2 u PRBC   History of scarlet fever 1940s   History of stress test 06/2011   No significant ischemia, this is a low risk scan. Clinical correlation recommended Abnormal myocardial perfusion study.   Hx of echocardiogram 05/2009   EF 40-45%, he did have mild annular calcification with mild-to-moderate MR and mild TR as well as aortic valve sclerosis. Estimated RV systolic pressure was 21 mm.   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   Neck pain    "not chronic" (09/07/2016)   Obesity    OSA on CPAP    Presence of permanent cardiac pacemaker    RBBB    Renal insufficiency    Right renal mass    Spinal stenosis of lumbar region     Past Surgical History:  Procedure Laterality Date   APPENDECTOMY     AV FISTULA PLACEMENT Right 02/09/2020   Procedure: ARTERIOVENOUS (AV) FISTULA  CREATION RIGHT UPPER EXTREMITY;  Surgeon: Cephus Shelling, MD;  Location: [facility] OR;  Service: Vascular;  Laterality: Right;   BASCILIC VEIN TRANSPOSITION Right 03/30/2020   Procedure: RIGHT SECOND  STAGE BASCILIC VEIN TRANSPOSITION;  Surgeon: Maeola Harman, MD;  Location[facility] OR;  Service: Vascular;  Laterality: Right;   CARDIAC CATHETERIZATION  2009   he was found to have a atretic LIMA graft to his LAD and had a patent vein graft supplying his diagonal vessel. At that time, he had 70% narrowing in his LAD beyond a  diagonal vessel which was initially treated medically.   COLONOSCOPY WITH PROPOFOL Left 09/09/2016   Procedure: COLONOSCOPY WITH PROPOFOL;  Surgeon: Willis Modena, MD;  Location: [facility] ENDOSCOPY;  Service: Endoscopy;  Laterality: Left;   CORONARY ANGIOPLASTY WITH STENT PLACEMENT  April 2012   LAD DES   CORONARY ARTERY BYPASS GRAFT  1992   with LIMA to the LAD and diagonal.   CYSTOSCOPY N/A 03/31/2019   Procedure: CYSTOSCOPY FLEXIBLE;  Surgeon: Crista Elliot, MD;  Location: [facility] ORS;  Service: Urology;  Laterality: N/A;   CYSTOSCOPY WITH URETHRAL DILATATION N/A 05/21/2019   Procedure: CYSTOSCOPY WITH URETHRAL BALLOON DILATATION;  Surgeon: Crista Elliot, MD;  Location: [facility] ORS;  Service: Urology;  Laterality: N/A;   ESOPHAGOGASTRODUODENOSCOPY N/A 11/16/2019   Procedure: ESOPHAGOGASTRODUODENOSCOPY (EGD);  Surgeon: Charlott Rakes, MD;  Location: [facility] ENDOSCOPY;  Service: Endoscopy;  Laterality: N/A;   ESOPHAGOGASTRODUODENOSCOPY (EGD) WITH PROPOFOL Left 09/08/2016   Procedure: ESOPHAGOGASTRODUODENOSCOPY (EGD) WITH PROPOFOL;  Surgeon: Kerin Salen, MD;  Location: [facility] ENDOSCOPY;  Service: Gastroenterology;  Laterality: Left;   EXCHANGE OF A DIALYSIS CATHETER Right 02/09/2020   Procedure: EXCHANGE OF A DIALYSIS CATHETER;  Surgeon: Cephus Shelling, MD;  Location: [facility] OR;  Service: Vascular;  Laterality: Right;   GIVENS CAPSULE STUDY N/A 11/16/2019   Procedure: GIVENS CAPSULE STUDY;  Surgeon: Charlott Rakes, MD;  Location: [facility] ENDOSCOPY;  Service: Endoscopy;  Laterality: N/A;   INGUINAL HERNIA REPAIR     "don't remeimber which side"   IR FLUORO GUIDE CV LINE RIGHT  02/05/2020   IR US GUIDE VASC ACCESS RIGHT  02/05/2020   LAPAROSCOPIC NEPHRECTOMY Right 03/31/2019   Procedure: RIGHT  HAND ASSIST LAPAROSCOPIC NEPHRECTOMY;  Surgeon: Crista Elliot, MD;  Location: [facility] ORS;  Service: Urology;  Laterality: Right;   PACEMAKER IMPLANT N/A 12/25/2016   Procedure: PACEMAKER IMPLANT;  Surgeon: Duke Salvia, MD;  Location: [facility] INVASIVE CV LAB;  Service: Cardiovascular;  Laterality: N/A;   TEE WITHOUT CARDIOVERSION N/A 02/06/2020   Procedure: TRANSESOPHAGEAL ECHOCARDIOGRAM (TEE);  Surgeon: Chilton Si, MD;  Location: [facility] ENDOSCOPY;  Service: Cardiovascular;  Laterality: N/A;   TONSILLECTOMY      Allergies  Allergen Reactions   Simvastatin Other (See Comments)    Abdominal cramps   Nitrostat [Nitroglycerin] Other (See Comments)    Causes blood pressure to "bottom out"    Current Outpatient Medications  Medication Sig Dispense Refill   acetaminophen (TYLENOL) 500 MG tablet Take 500-1,000 mg by mouth every 6 (six) hours as needed for mild pain, moderate pain or headache.      allopurinol (ZYLOPRIM) 300 MG tablet Take 300 mg by mouth daily.     atorvastatin (LIPITOR) 20 MG tablet TAKE 1 TABLET BY MOUTH DAILY 90 tablet 3   B Complex-C (B-COMPLEX WITH VITAMIN C) tablet Take 1 tablet by mouth daily.     ferric citrate (AURYXIA) 1 GM 210 MG(Fe) tablet Take 210 mg by mouth 3 (three) times daily with meals. Take 2 Pills after each meal     ferrous sulfate 325 (65 FE) MG tablet Take by mouth as needed. During dialysis  metoprolol succinate (TOPROL-XL) 25 MG 24 hr tablet TAKE 1 TABLET BY MOUTH DAILY 90 tablet 0   midodrine (PROAMATINE) 2.5 MG tablet TAKE 1 TABLET BY MOUTH IMMEDIATELY AFTER DIALYSIS THEN ANOTHER TABLET 4 HOURS LATER 90 tablet 0   pantoprazole (PROTONIX) 40 MG tablet Take 40 mg by mouth daily.     Tamsulosin HCl (FLOMAX) 0.4 MG CAPS Take 1 capsule (0.4 mg total) by mouth daily. 30 capsule 0   VITAMIN B1-B12 IJ Inject as directed. Once per week     No current facility-administered medications for this visit.    Social History   Socioeconomic History   Marital status: Married    Spouse name: Not on file   Number of children: Not on file   Years of education: Not on file   Highest education level: Not on file  Occupational History   Not on file  Tobacco Use   Smoking  status: Former    Current packs/day: 1.00    Average packs/day: 1 pack/day for 30.0 years (30.0 ttl pk-yrs)    Types: Cigarettes   Smokeless tobacco: Never   Tobacco comments:    quit ~ 1985  Vaping Use   Vaping status: Never Used  Substance and Sexual Activity   Alcohol use: Not Currently    Alcohol/week: 0.0 standard drinks of alcohol    Comment: rare beer   Drug use: No   Sexual activity: Not Currently  Other Topics Concern   Not on file  Social History Narrative   Not on file   Social Determinants of Health   Financial Resource Strain: Not on file  Food Insecurity: Not on file  Transportation Needs: Not on file  Physical Activity: Not on file  Stress: Not on file  Social Connections: Not on file  Intimate Partner Violence: Not on file    Socially he is married, has 3 children 7 grandchildren. He does not use tobacco or alcohol.  ROS General: Negative; No fevers, chills, or night sweats; Moderate obesity HEENT: Negative; No changes in vision or hearing, sinus congestion, difficulty swallowing Pulmonary: Negative; No cough, wheezing, shortness of breath, hemoptysis Cardiovascular: Negative; No chest pain, presyncope, syncope, palpatations GI: Recent GI bleed, status post endoscopy and colonoscopy without definitive abnormality. GU: On dialysis on Monday Wednesdays and Fridays Musculoskeletal: History of paraspinal myositis  Hematologic/Oncology: Negative; no easy bruising, bleeding Endocrine: Negative; no heat/cold intolerance; no diabetes Neuro: Occasional paresthesias; no changes in balance, headaches Skin: Negative; No rashes or skin lesions Psychiatric: Negative; No behavioral problems, depression Sleep: Positive for sleep apnea on CPAP; No snoring, daytime sleepiness, hypersomnolence, bruxism, restless legs, hypnogognic hallucinations, no cataplexy Other comprehensive 14 point system review is negative.   PE BP (!) 110/44   Pulse 79   Ht 5\' 6"  (1.676 m)    Wt 167 lb (75.8 kg)   SpO2 100%   BMI 26.95 kg/m    Repeat blood pressure by me was 110/56  Wt Readings from Last 3 Encounters:  10/31/22 167 lb (75.8 kg)  10/17/22 167 lb 6.4 oz (75.9 kg)  07/26/21 167 lb 6.4 oz (75.9 kg)   General: Alert, oriented, no distress.  Skin:  HEENT: Normocephalic, atraumatic. Pupils equal round and reactive to light; sclera anicteric; extraocular muscles intact;  Nose without nasal septal hypertrophy Mouth/Parynx benign; Mallinpatti scale 3 Neck: No JVD, no carotid bruits; normal carotid upstroke Lungs: clear to ausculatation and percussion; no wheezing or rales Chest wall: without tenderness to palpitation Heart: PMI not displaced, RRR,  s1 s2 normal, 1/6 systolic murmur, no diastolic murmur, no rubs, gallops, thrills, or heaves Abdomen: soft, nontender; no hepatosplenomehaly, BS+; abdominal aorta nontender and not dilated by palpation. Back: no CVA tenderness; lipoma R back Pulses 2+; right arm AV fistula for dialysis Musculoskeletal: reduced range of motion right arm Extremities: no clubbing cyanosis or edema, Homan's sign negative  Neurologic: grossly nonfocal; Cranial nerves grossly wnl Psychologic: Normal mood and affect  EKG Interpretation Date/Time:  Tuesday October 31 2022 15:48:36 EDT Ventricular Rate:  79 PR Interval:    QRS Duration:  154 QT Interval:  448 QTC Calculation: 513 R Axis:   88  Text Interpretation: Ventricular-paced rhythm When compared with ECG of 17-Oct-2022 14:33, Vent. rate has increased BY  17 BPM Confirmed by Nicki Guadalajara ([id number]) on 10/31/2022 4:34:12 PM    June 30, 2021 ECG (independently read by me):  Atrial sensed-ventricular paced at 89  September 06, 2020 ECG (independently read by me): AV paced at 60; PR 184 msec  February 19, 2020 ECG (independently read by me): V paced at 81 with 100% capture.  August 2021 ECG (independently read by me): Atrial sensed, ventricular paced rhythm at 68 bpm  January 13, 2019  ECG (independently read by me): Ventricular paced rhythm at 68 bpm.  June 2019 ECG (independently read by me): Sinus rhythm appropriate atrial sensing and atrial pacing,  right bundle branch block, left axis deviation  September 2018 ECG (independently read by me): normal sinus rhythm at 60 bpm.  Incomplete right bundle branch block.  Mild inferolateral ST changes.  May 2018 ECG (independently read by me): sinus bradycardia 58 bpm.  Left axis deviation.  Right bundle branch block with repolarization changes.  QTc interval 471 ms.  April 2018 ECG (independently read by me):NSR At 61, left axis deviation.  Right bundle branch block.  September 2017 ECG (independently read by me): Normal sinus rhythm with mild sinus arrhythmia.  First degree AV block with a PR interval at 212 ms.  Right bundle-branch block.  March 2017 ECG (independently read by me): Normal sinus rhythm at 61 bpm.  Right bundle branch block with repolarization changes.  QTc interval 477 ms.  PR interval 204 ms.  October 2016 ECG (independently read by me): Normal sinus rhythm at 65 bpm.  Right bundle branch block.  First degree AV block.  June 2015 ECG (independently read by me): Normal sinus rhythm at 61 beats per minute.  Right bundle branch block.  Mild voltage criteria for LVH in aVL  ECG (independently read by me): Sinus rhythm at 60 beats per minute. Right bundle branch block; PAC, inferolateral T abnormalities.  LABS: I personally reviewed the lab work from November 15, 2018.     Latest Ref Rng & Units 03/30/2020    8:02 AM 02/12/2020    1:49 AM 02/11/2020    2:31 AM  BMP  Glucose 70 - 99 mg/dL 604  93  84   BUN 8 - 23 mg/dL 22  26  19    Creatinine 0.61 - 1.24 mg/dL 5.40  9.81  1.91   Sodium 135 - 145 mmol/L 139  141  141   Potassium 3.5 - 5.1 mmol/L 3.2  4.2  4.0   Chloride 98 - 111 mmol/L 93  107  105   CO2 22 - 32 mmol/L  22  25   Calcium 8.9 - 10.3 mg/dL  7.6  7.7       Latest Ref Rng & Units 02/12/2020  1:49 AM 02/10/2020    8:48 AM 02/06/2020    1:46 AM  Hepatic Function  Albumin 3.5 - 5.0 g/dL 1.8  1.7  1.7       Latest Ref Rng & Units 03/30/2020    8:02 AM 02/12/2020    1:49 AM 02/11/2020    2:31 AM  CBC  WBC 4.0 - 10.5 K/uL  9.4  9.2   Hemoglobin 13.0 - 17.0 g/dL 7.8  8.7  8.5   Hematocrit 39.0 - 52.0 % 23.0  27.9  28.5   Platelets 150 - 400 K/uL  141  145    Lab Results  Component Value Date   MCV 85.6 02/12/2020   MCV 86.9 02/11/2020   MCV 87.8 02/10/2020   Lab Results  Component Value Date   TSH 2.159 01/21/2020    Lab Results  Component Value Date   HGBA1C 6.1 (H) 02/04/2020     Lipid Panel     Component Value Date/Time   CHOL 104 12/22/2016 0535   TRIG 91 12/22/2016 0535   HDL 29 (L) 12/22/2016 0535   CHOLHDL 3.6 12/22/2016 0535   VLDL 18 12/22/2016 0535   LDLCALC 57 12/22/2016 0535     BNP No results found for: "PROBNP"  Lipid Panel     Component Value Date/Time   CHOL 104 12/22/2016 0535   TRIG 91 12/22/2016 0535   HDL 29 (L) 12/22/2016 0535   CHOLHDL 3.6 12/22/2016 0535   VLDL 18 12/22/2016 0535   LDLCALC 57 12/22/2016 0535   Lipid Panel     Component Value Date/Time   CHOL 104 12/22/2016 0535   TRIG 91 12/22/2016 0535   HDL 29 (L) 12/22/2016 0535   CHOLHDL 3.6 12/22/2016 0535   VLDL 18 12/22/2016 0535   LDLCALC 57 12/22/2016 0535    RADIOLOGY: No results found.  IMPRESSION:  1. Coronary artery disease involving native coronary artery of native heart without angina pectoris   2. Hx of CABG   3. Permanent atrial fibrillation (HCC)   4. OSA (obstructive sleep apnea)   5. Pacemaker   6. 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 Nonrheumatic tricuspid valve regurgitation   8. Nonrheumatic mitral valve regurgitation     ASSESSMENT AND PLAN: Mr. Ilean Skill is an 82 year-old Caucasian male who is 32 years status post CABG revascularization surgery in 1992 and 12 years since a stent was placed in the LAD  beyond this diagonal vessel in 2012. He has a documented atretic LIMA graft and a patent vein graft which supplies this diagonal graft.  His echo Doppler study in 2013 showed an ejection fraction of 50-55% and probable grade 2 diastolic dysfunction. He did have mild left atrial dilatation and mild mitral regurgitation.  On his echo Doppler study in April 2018 EF remained stable at 50-55%; grade 2 diastolic dysfunction, and there was evidence for  severe left atrial enlargement with mild right atrial enlargement and mild pulmonary hypertension.  His nuclear perfusion study remained in the low risk with only a small defect in the mid apical and anterior wall consistent with prior infarction without associated ischemia.  He has a history of PAF which he was started on anticoagulation therapy and ultimately developed progressive renal disease. 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 Graciela Husbands and continues to be followed by Dr. Graciela Husbands  for pacemaker follow-up.  Over the last several years he developed end-stage renal disease and is on dialysis on Monday Wednesday and Friday.  He states he feels well on nondialysis days but during dialysis oftentimes his blood pressure is dropping to 80/40.  He is now taking midodrine immediately after dialysis.  He has been followed closely by Dr. Graciela Husbands and is underlying permanent atrial fibrillation but continues to be 100% ventricular paced at heart rate at 79 bpm today on ECG.  Remotely, he had been on anticoagulation TEE done following an upper GI bleed showed left atrial appendage thrombus and once he was restarted on anticoagulation he was transition from Xarelto to Eliquis.  He apparently self discontinued his previous anticoagulation and reportedly has been taking aspirin 81 mg.  It may be worthwhile toconsider repeat echo Doppler evaluation for  reassessment.  Dr. Graciela Husbands discussed his anticoagulation discontinuance. with him at his most recent office visit from October in 2024.  He is not having any recurrent anginal symptomatology and is on metoprolol succinate 25 mg daily.  He continues to be on atorvastatin 20 mg for hyperlipidemia and pantoprazole for GERD.  Continues to undergo remote pacemaker checks which are scheduled for November and February.  He will be seeing Dr. Sherald Hess who will be reassessing his AV fistula fistula.  He continues to use CPAP with excellent compliance.  I discussed with him my plan for retirement next year.      Lennette Bihari, MD, [facility]  10/31/2022 6:35 PM

## 2022-11-01 DIAGNOSIS — Z992 Dependence on renal dialysis: Secondary | ICD-10-CM | POA: Diagnosis not present

## 2022-11-01 DIAGNOSIS — D689 Coagulation defect, unspecified: Secondary | ICD-10-CM | POA: Diagnosis not present

## 2022-11-01 DIAGNOSIS — R197 Diarrhea, unspecified: Secondary | ICD-10-CM | POA: Diagnosis not present

## 2022-11-01 DIAGNOSIS — N186 End stage renal disease: Secondary | ICD-10-CM | POA: Diagnosis not present

## 2022-11-01 DIAGNOSIS — D509 Iron deficiency anemia, unspecified: Secondary | ICD-10-CM | POA: Diagnosis not present

## 2022-11-01 DIAGNOSIS — N2581 Secondary hyperparathyroidism of renal origin: Secondary | ICD-10-CM | POA: Diagnosis not present

## 2022-11-01 DIAGNOSIS — Z23 Encounter for immunization: Secondary | ICD-10-CM | POA: Diagnosis not present

## 2022-11-01 DIAGNOSIS — E876 Hypokalemia: Secondary | ICD-10-CM | POA: Diagnosis not present

## 2022-11-01 DIAGNOSIS — D631 Anemia in chronic kidney disease: Secondary | ICD-10-CM | POA: Diagnosis not present

## 2022-11-03 DIAGNOSIS — N186 End stage renal disease: Secondary | ICD-10-CM | POA: Diagnosis not present

## 2022-11-03 DIAGNOSIS — N2581 Secondary hyperparathyroidism of renal origin: Secondary | ICD-10-CM | POA: Diagnosis not present

## 2022-11-03 DIAGNOSIS — Z23 Encounter for immunization: Secondary | ICD-10-CM | POA: Diagnosis not present

## 2022-11-03 DIAGNOSIS — D689 Coagulation defect, unspecified: Secondary | ICD-10-CM | POA: Diagnosis not present

## 2022-11-03 DIAGNOSIS — D631 Anemia in chronic kidney disease: Secondary | ICD-10-CM | POA: Diagnosis not present

## 2022-11-03 DIAGNOSIS — R197 Diarrhea, unspecified: Secondary | ICD-10-CM | POA: Diagnosis not present

## 2022-11-03 DIAGNOSIS — E876 Hypokalemia: Secondary | ICD-10-CM | POA: Diagnosis not present

## 2022-11-03 DIAGNOSIS — Z992 Dependence on renal dialysis: Secondary | ICD-10-CM | POA: Diagnosis not present

## 2022-11-03 DIAGNOSIS — D509 Iron deficiency anemia, unspecified: Secondary | ICD-10-CM | POA: Diagnosis not present

## 2022-11-06 DIAGNOSIS — Z23 Encounter for immunization: Secondary | ICD-10-CM | POA: Diagnosis not present

## 2022-11-06 DIAGNOSIS — D509 Iron deficiency anemia, unspecified: Secondary | ICD-10-CM | POA: Diagnosis not present

## 2022-11-06 DIAGNOSIS — E876 Hypokalemia: Secondary | ICD-10-CM | POA: Diagnosis not present

## 2022-11-06 DIAGNOSIS — D689 Coagulation defect, unspecified: Secondary | ICD-10-CM | POA: Diagnosis not present

## 2022-11-06 DIAGNOSIS — R197 Diarrhea, unspecified: Secondary | ICD-10-CM | POA: Diagnosis not present

## 2022-11-06 DIAGNOSIS — N2581 Secondary hyperparathyroidism of renal origin: Secondary | ICD-10-CM | POA: Diagnosis not present

## 2022-11-06 DIAGNOSIS — Z992 Dependence on renal dialysis: Secondary | ICD-10-CM | POA: Diagnosis not present

## 2022-11-06 DIAGNOSIS — N186 End stage renal disease: Secondary | ICD-10-CM | POA: Diagnosis not present

## 2022-11-06 DIAGNOSIS — D631 Anemia in chronic kidney disease: Secondary | ICD-10-CM | POA: Diagnosis not present

## 2022-11-08 DIAGNOSIS — R197 Diarrhea, unspecified: Secondary | ICD-10-CM | POA: Diagnosis not present

## 2022-11-08 DIAGNOSIS — N2581 Secondary hyperparathyroidism of renal origin: Secondary | ICD-10-CM | POA: Diagnosis not present

## 2022-11-08 DIAGNOSIS — D689 Coagulation defect, unspecified: Secondary | ICD-10-CM | POA: Diagnosis not present

## 2022-11-08 DIAGNOSIS — D509 Iron deficiency anemia, unspecified: Secondary | ICD-10-CM | POA: Diagnosis not present

## 2022-11-08 DIAGNOSIS — D631 Anemia in chronic kidney disease: Secondary | ICD-10-CM | POA: Diagnosis not present

## 2022-11-08 DIAGNOSIS — Z23 Encounter for immunization: Secondary | ICD-10-CM | POA: Diagnosis not present

## 2022-11-08 DIAGNOSIS — E876 Hypokalemia: Secondary | ICD-10-CM | POA: Diagnosis not present

## 2022-11-08 DIAGNOSIS — N186 End stage renal disease: Secondary | ICD-10-CM | POA: Diagnosis not present

## 2022-11-08 DIAGNOSIS — Z992 Dependence on renal dialysis: Secondary | ICD-10-CM | POA: Diagnosis not present

## 2022-11-09 DIAGNOSIS — N186 End stage renal disease: Secondary | ICD-10-CM | POA: Diagnosis not present

## 2022-11-09 DIAGNOSIS — I129 Hypertensive chronic kidney disease with stage 1 through stage 4 chronic kidney disease, or unspecified chronic kidney disease: Secondary | ICD-10-CM | POA: Diagnosis not present

## 2022-11-09 DIAGNOSIS — Z992 Dependence on renal dialysis: Secondary | ICD-10-CM | POA: Diagnosis not present

## 2022-11-10 DIAGNOSIS — D509 Iron deficiency anemia, unspecified: Secondary | ICD-10-CM | POA: Diagnosis not present

## 2022-11-10 DIAGNOSIS — R52 Pain, unspecified: Secondary | ICD-10-CM | POA: Diagnosis not present

## 2022-11-10 DIAGNOSIS — D689 Coagulation defect, unspecified: Secondary | ICD-10-CM | POA: Diagnosis not present

## 2022-11-10 DIAGNOSIS — D631 Anemia in chronic kidney disease: Secondary | ICD-10-CM | POA: Diagnosis not present

## 2022-11-10 DIAGNOSIS — N186 End stage renal disease: Secondary | ICD-10-CM | POA: Diagnosis not present

## 2022-11-10 DIAGNOSIS — N2581 Secondary hyperparathyroidism of renal origin: Secondary | ICD-10-CM | POA: Diagnosis not present

## 2022-11-10 DIAGNOSIS — Z992 Dependence on renal dialysis: Secondary | ICD-10-CM | POA: Diagnosis not present

## 2022-11-10 DIAGNOSIS — E876 Hypokalemia: Secondary | ICD-10-CM | POA: Diagnosis not present

## 2022-11-13 DIAGNOSIS — D631 Anemia in chronic kidney disease: Secondary | ICD-10-CM | POA: Diagnosis not present

## 2022-11-13 DIAGNOSIS — D509 Iron deficiency anemia, unspecified: Secondary | ICD-10-CM | POA: Diagnosis not present

## 2022-11-13 DIAGNOSIS — Z992 Dependence on renal dialysis: Secondary | ICD-10-CM | POA: Diagnosis not present

## 2022-11-13 DIAGNOSIS — N2581 Secondary hyperparathyroidism of renal origin: Secondary | ICD-10-CM | POA: Diagnosis not present

## 2022-11-13 DIAGNOSIS — N186 End stage renal disease: Secondary | ICD-10-CM | POA: Diagnosis not present

## 2022-11-13 DIAGNOSIS — D689 Coagulation defect, unspecified: Secondary | ICD-10-CM | POA: Diagnosis not present

## 2022-11-13 DIAGNOSIS — R52 Pain, unspecified: Secondary | ICD-10-CM | POA: Diagnosis not present

## 2022-11-13 DIAGNOSIS — E876 Hypokalemia: Secondary | ICD-10-CM | POA: Diagnosis not present

## 2022-11-13 LAB — CUP PACEART INCLINIC DEVICE CHECK
Battery Remaining Longevity: 29 mo
Battery Voltage: 2.85 V
Brady Statistic AP VP Percent: 19.27 %
Brady Statistic AP VS Percent: 0.25 %
Brady Statistic AS VP Percent: 77.91 %
Brady Statistic AS VS Percent: 2.46 %
Brady Statistic RA Percent Paced: 14.93 %
Brady Statistic RV Percent Paced: 96.93 %
Date Time Interrogation Session: 20241008171320
Implantable Lead Connection Status: 753985
Implantable Lead Connection Status: 753985
Implantable Lead Implant Date: 20181217
Implantable Lead Implant Date: 20181217
Implantable Lead Location: 753859
Implantable Lead Location: 753859
Implantable Lead Model: 3830
Implantable Lead Model: 5076
Implantable Pulse Generator Implant Date: 20181217
Lead Channel Impedance Value: 342 Ohm
Lead Channel Impedance Value: 342 Ohm
Lead Channel Impedance Value: 418 Ohm
Lead Channel Impedance Value: 532 Ohm
Lead Channel Pacing Threshold Amplitude: 0 V
Lead Channel Pacing Threshold Amplitude: 0 V
Lead Channel Pacing Threshold Amplitude: 0 V
Lead Channel Pacing Threshold Amplitude: 0 V
Lead Channel Pacing Threshold Amplitude: 0.5 V
Lead Channel Pacing Threshold Amplitude: 0.5 V
Lead Channel Pacing Threshold Amplitude: 0.5 V
Lead Channel Pacing Threshold Amplitude: 0.5 V
Lead Channel Pacing Threshold Amplitude: 0.75 V
Lead Channel Pacing Threshold Pulse Width: 0 ms
Lead Channel Pacing Threshold Pulse Width: 0 ms
Lead Channel Pacing Threshold Pulse Width: 0.4 ms
Lead Channel Pacing Threshold Pulse Width: 0.4 ms
Lead Channel Pacing Threshold Pulse Width: 0.4 ms
Lead Channel Pacing Threshold Pulse Width: 0.4 ms
Lead Channel Pacing Threshold Pulse Width: 0.4 ms
Lead Channel Sensing Intrinsic Amplitude: 0.25 mV
Lead Channel Sensing Intrinsic Amplitude: 0.25 mV
Lead Channel Sensing Intrinsic Amplitude: 10.125 mV
Lead Channel Sensing Intrinsic Amplitude: 6.25 mV
Lead Channel Setting Pacing Amplitude: 2 V
Lead Channel Setting Pacing Amplitude: 2.5 V
Lead Channel Setting Pacing Pulse Width: 1 ms
Lead Channel Setting Sensing Sensitivity: 1.2 mV
Zone Setting Status: 755011
Zone Setting Status: 755011

## 2022-11-14 ENCOUNTER — Other Ambulatory Visit: Payer: Self-pay | Admitting: *Deleted

## 2022-11-14 DIAGNOSIS — H2511 Age-related nuclear cataract, right eye: Secondary | ICD-10-CM | POA: Diagnosis not present

## 2022-11-14 DIAGNOSIS — N185 Chronic kidney disease, stage 5: Secondary | ICD-10-CM

## 2022-11-15 ENCOUNTER — Ambulatory Visit (INDEPENDENT_AMBULATORY_CARE_PROVIDER_SITE_OTHER): Payer: Medicare Other

## 2022-11-15 DIAGNOSIS — D509 Iron deficiency anemia, unspecified: Secondary | ICD-10-CM | POA: Diagnosis not present

## 2022-11-15 DIAGNOSIS — N186 End stage renal disease: Secondary | ICD-10-CM | POA: Diagnosis not present

## 2022-11-15 DIAGNOSIS — I442 Atrioventricular block, complete: Secondary | ICD-10-CM

## 2022-11-15 DIAGNOSIS — E876 Hypokalemia: Secondary | ICD-10-CM | POA: Diagnosis not present

## 2022-11-15 DIAGNOSIS — D631 Anemia in chronic kidney disease: Secondary | ICD-10-CM | POA: Diagnosis not present

## 2022-11-15 DIAGNOSIS — Z992 Dependence on renal dialysis: Secondary | ICD-10-CM | POA: Diagnosis not present

## 2022-11-15 DIAGNOSIS — N2581 Secondary hyperparathyroidism of renal origin: Secondary | ICD-10-CM | POA: Diagnosis not present

## 2022-11-15 DIAGNOSIS — R52 Pain, unspecified: Secondary | ICD-10-CM | POA: Diagnosis not present

## 2022-11-15 DIAGNOSIS — D689 Coagulation defect, unspecified: Secondary | ICD-10-CM | POA: Diagnosis not present

## 2022-11-15 LAB — CUP PACEART REMOTE DEVICE CHECK
Battery Remaining Longevity: 29 mo
Battery Voltage: 2.85 V
Brady Statistic RA Percent Paced: 0.46 %
Brady Statistic RV Percent Paced: 98.78 %
Date Time Interrogation Session: 20241105225444
Implantable Lead Connection Status: 753985
Implantable Lead Connection Status: 753985
Implantable Lead Implant Date: 20181217
Implantable Lead Implant Date: 20181217
Implantable Lead Location: 753859
Implantable Lead Location: 753859
Implantable Lead Model: 3830
Implantable Lead Model: 5076
Implantable Pulse Generator Implant Date: 20181217
Lead Channel Impedance Value: 304 Ohm
Lead Channel Impedance Value: 342 Ohm
Lead Channel Impedance Value: 399 Ohm
Lead Channel Impedance Value: 475 Ohm
Lead Channel Pacing Threshold Amplitude: 0.5 V
Lead Channel Pacing Threshold Amplitude: 0.75 V
Lead Channel Pacing Threshold Pulse Width: 0.4 ms
Lead Channel Pacing Threshold Pulse Width: 0.4 ms
Lead Channel Sensing Intrinsic Amplitude: 0.25 mV
Lead Channel Sensing Intrinsic Amplitude: 0.25 mV
Lead Channel Sensing Intrinsic Amplitude: 7.375 mV
Lead Channel Sensing Intrinsic Amplitude: 7.375 mV
Lead Channel Setting Pacing Amplitude: 2 V
Lead Channel Setting Pacing Amplitude: 2.5 V
Lead Channel Setting Pacing Pulse Width: 1 ms
Lead Channel Setting Sensing Sensitivity: 1.2 mV
Zone Setting Status: 755011
Zone Setting Status: 755011

## 2022-11-17 DIAGNOSIS — R52 Pain, unspecified: Secondary | ICD-10-CM | POA: Diagnosis not present

## 2022-11-17 DIAGNOSIS — Z992 Dependence on renal dialysis: Secondary | ICD-10-CM | POA: Diagnosis not present

## 2022-11-17 DIAGNOSIS — D631 Anemia in chronic kidney disease: Secondary | ICD-10-CM | POA: Diagnosis not present

## 2022-11-17 DIAGNOSIS — D509 Iron deficiency anemia, unspecified: Secondary | ICD-10-CM | POA: Diagnosis not present

## 2022-11-17 DIAGNOSIS — N2581 Secondary hyperparathyroidism of renal origin: Secondary | ICD-10-CM | POA: Diagnosis not present

## 2022-11-17 DIAGNOSIS — E876 Hypokalemia: Secondary | ICD-10-CM | POA: Diagnosis not present

## 2022-11-17 DIAGNOSIS — D689 Coagulation defect, unspecified: Secondary | ICD-10-CM | POA: Diagnosis not present

## 2022-11-17 DIAGNOSIS — N186 End stage renal disease: Secondary | ICD-10-CM | POA: Diagnosis not present

## 2022-11-20 DIAGNOSIS — D509 Iron deficiency anemia, unspecified: Secondary | ICD-10-CM | POA: Diagnosis not present

## 2022-11-20 DIAGNOSIS — Z992 Dependence on renal dialysis: Secondary | ICD-10-CM | POA: Diagnosis not present

## 2022-11-20 DIAGNOSIS — D631 Anemia in chronic kidney disease: Secondary | ICD-10-CM | POA: Diagnosis not present

## 2022-11-20 DIAGNOSIS — D689 Coagulation defect, unspecified: Secondary | ICD-10-CM | POA: Diagnosis not present

## 2022-11-20 DIAGNOSIS — N2581 Secondary hyperparathyroidism of renal origin: Secondary | ICD-10-CM | POA: Diagnosis not present

## 2022-11-20 DIAGNOSIS — R52 Pain, unspecified: Secondary | ICD-10-CM | POA: Diagnosis not present

## 2022-11-20 DIAGNOSIS — N186 End stage renal disease: Secondary | ICD-10-CM | POA: Diagnosis not present

## 2022-11-20 DIAGNOSIS — E876 Hypokalemia: Secondary | ICD-10-CM | POA: Diagnosis not present

## 2022-11-22 ENCOUNTER — Ambulatory Visit (INDEPENDENT_AMBULATORY_CARE_PROVIDER_SITE_OTHER): Payer: Medicare Other | Admitting: Vascular Surgery

## 2022-11-22 ENCOUNTER — Ambulatory Visit (HOSPITAL_COMMUNITY)
Admission: RE | Admit: 2022-11-22 | Discharge: 2022-11-22 | Disposition: A | Discharge status: Home / Self Care | Payer: Medicare Other | Source: Ambulatory Visit | Attending: Vascular Surgery | Admitting: Vascular Surgery

## 2022-11-22 ENCOUNTER — Encounter: Payer: Self-pay | Admitting: Vascular Surgery

## 2022-11-22 VITALS — BP 132/72 | HR 82 | Temp 98.3°F | Ht 66.0 in | Wt 163.2 lb

## 2022-11-22 DIAGNOSIS — N185 Chronic kidney disease, stage 5: Secondary | ICD-10-CM | POA: Insufficient documentation

## 2022-11-22 DIAGNOSIS — N2581 Secondary hyperparathyroidism of renal origin: Secondary | ICD-10-CM | POA: Diagnosis not present

## 2022-11-22 DIAGNOSIS — N186 End stage renal disease: Secondary | ICD-10-CM | POA: Diagnosis not present

## 2022-11-22 DIAGNOSIS — R52 Pain, unspecified: Secondary | ICD-10-CM | POA: Diagnosis not present

## 2022-11-22 DIAGNOSIS — D689 Coagulation defect, unspecified: Secondary | ICD-10-CM | POA: Diagnosis not present

## 2022-11-22 DIAGNOSIS — E876 Hypokalemia: Secondary | ICD-10-CM | POA: Diagnosis not present

## 2022-11-22 DIAGNOSIS — D509 Iron deficiency anemia, unspecified: Secondary | ICD-10-CM | POA: Diagnosis not present

## 2022-11-22 DIAGNOSIS — D631 Anemia in chronic kidney disease: Secondary | ICD-10-CM | POA: Diagnosis not present

## 2022-11-22 DIAGNOSIS — Z992 Dependence on renal dialysis: Secondary | ICD-10-CM | POA: Diagnosis not present

## 2022-11-22 MED ORDER — GABAPENTIN 300 MG PO CAPS: 300.0000 mg | ORAL_CAPSULE | Freq: Every day | ORAL | 2 refills | Status: DC

## 2022-11-22 NOTE — Progress Notes (Signed)
[name] MRN: [id number] DOB: [birth year] Sex: male  REASON FOR CONSULT: Right hand numbness, concern for steal   HPI: Paul Price is a 82 y.o. male, with hx ESRD, atrial fibrillation, CAD, DVT, HTN, that presents for evaluation of possible steal syndrome in his right upper extremity.  Patient has a right basilic vein fistula created on 03/30/2020.  This was working well until about 7 or 8 weeks ago when he states this was infiltrated in dialysis and he got sudden pain in his upper arm and also developed hematoma.  He states they were still able to use it using small needles.  The swelling has gotten better but he still has pain.  He also has some numbness in the right third fourth and fifth digit.  Fistulas working well and he got dialysis today.  Past Medical History:  Diagnosis Date   Arthritis    "shoulders" (09/07/2016)   Atrial fibrillation (HCC)    BPH (benign prostatic hyperplasia)    Chronic kidney disease (CKD), stage III (moderate) (HCC)    Coronary artery disease    DDD (degenerative disc disease), cervical    DVT (deep venous thrombosis) (HCC) 12/2013   LLE   GERD (gastroesophageal reflux disease)    Gout    High cholesterol    History of blood transfusion 09/06/2016   2 u PRBC   History of scarlet fever 1940s   History of stress test 06/2011   No significant ischemia, this is a low risk scan. Clinical correlation recommended Abnormal myocardial perfusion study.   Hx of echocardiogram 05/2009   EF 40-45%, he did have mild annular calcification with mild-to-moderate MR and mild TR as well as aortic valve sclerosis. Estimated RV systolic pressure was 21 mm.   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   Neck pain    "not chronic" (09/07/2016)   Obesity    OSA on CPAP    Presence of permanent cardiac pacemaker    RBBB    Renal insufficiency    Right renal mass    Spinal stenosis of  lumbar region     Past Surgical History:  Procedure Laterality Date   APPENDECTOMY     AV FISTULA PLACEMENT Right 02/09/2020   Procedure: ARTERIOVENOUS (AV) FISTULA  CREATION RIGHT UPPER EXTREMITY;  Surgeon: Cephus Shelling, MD;  Location: [facility] OR;  Service: Vascular;  Laterality: Right;   BASCILIC VEIN TRANSPOSITION Right 03/30/2020   Procedure: RIGHT SECOND STAGE BASCILIC VEIN TRANSPOSITION;  Surgeon: Maeola Harman, MD;  Location: [facility] OR;  Service: Vascular;  Laterality: Right;   CARDIAC CATHETERIZATION  2009   he was found to have a atretic LIMA graft to his LAD and had a patent vein graft supplying his diagonal vessel. At that time, he had 70% narrowing in his LAD beyond a diagonal vessel which was initially treated medically.   COLONOSCOPY WITH PROPOFOL Left 09/09/2016   Procedure: COLONOSCOPY WITH PROPOFOL;  Surgeon: Willis Modena, MD;  Location: [facility] ENDOSCOPY;  Service: Endoscopy;  Laterality: Left;   CORONARY ANGIOPLASTY WITH STENT PLACEMENT  April 2012   LAD DES   CORONARY ARTERY BYPASS GRAFT  1992   with LIMA to the LAD and diagonal.   CYSTOSCOPY N/A 03/31/2019   Procedure: CYSTOSCOPY FLEXIBLE;  Surgeon: Crista Elliot, MD;  Location: [facility] ORS;  Service: Urology;  Laterality: N/A;   CYSTOSCOPY  WITH URETHRAL DILATATION N/A 05/21/2019   Procedure: CYSTOSCOPY WITH URETHRAL BALLOON DILATATION;  Surgeon: Crista Elliot, MD;  Location: [facility] ORS;  Service: Urology;  Laterality: N/A;   ESOPHAGOGASTRODUODENOSCOPY N/A 11/16/2019   Procedure: ESOPHAGOGASTRODUODENOSCOPY (EGD);  Surgeon: Charlott Rakes, MD;  Location: [facility] ENDOSCOPY;  Service: Endoscopy;  Laterality: N/A;   ESOPHAGOGASTRODUODENOSCOPY (EGD) WITH PROPOFOL Left 09/08/2016   Procedure: ESOPHAGOGASTRODUODENOSCOPY (EGD) WITH PROPOFOL;  Surgeon: Kerin Salen, MD;  Location: [facility] ENDOSCOPY;  Service: Gastroenterology;  Laterality: Left;   EXCHANGE OF A DIALYSIS CATHETER Right 02/09/2020   Procedure: EXCHANGE OF A DIALYSIS  CATHETER;  Surgeon: Cephus Shelling, MD;  Location: [facility] OR;  Service: Vascular;  Laterality: Right;   GIVENS CAPSULE STUDY N/A 11/16/2019   Procedure: GIVENS CAPSULE STUDY;  Surgeon: Charlott Rakes, MD;  Location: [facility] ENDOSCOPY;  Service: Endoscopy;  Laterality: N/A;   INGUINAL HERNIA REPAIR     "don't remeimber which side"   IR FLUORO GUIDE CV LINE RIGHT  02/05/2020   IR US GUIDE VASC ACCESS RIGHT  02/05/2020   LAPAROSCOPIC NEPHRECTOMY Right 03/31/2019   Procedure: RIGHT  HAND ASSIST LAPAROSCOPIC NEPHRECTOMY;  Surgeon: Crista Elliot, MD;  Location: [facility] ORS;  Service: Urology;  Laterality: Right;   PACEMAKER IMPLANT N/A 12/25/2016   Procedure: PACEMAKER IMPLANT;  Surgeon: Duke Salvia, MD;  Location: [facility] INVASIVE CV LAB;  Service: Cardiovascular;  Laterality: N/A;   TEE WITHOUT CARDIOVERSION N/A 02/06/2020   Procedure: TRANSESOPHAGEAL ECHOCARDIOGRAM (TEE);  Surgeon: Chilton Si, MD;  Location: [facility] ENDOSCOPY;  Service: Cardiovascular;  Laterality: N/A;   TONSILLECTOMY      Family History  Problem Relation Age of Onset   Heart disease Father     SOCIAL HISTORY: Social History   Socioeconomic History   Marital status: Married    Spouse name: Not on file   Number of children: Not on file   Years of education: Not on file   Highest education level: Not on file  Occupational History   Not on file  Tobacco Use   Smoking status: Former    Current packs/day: 1.00    Average packs/day: 1 pack/day for 30.0 years (30.0 ttl pk-yrs)    Types: Cigarettes   Smokeless tobacco: Never   Tobacco comments:    quit ~ 1985  Vaping Use   Vaping status: Never Used  Substance and Sexual Activity   Alcohol use: Not Currently    Alcohol/week: 0.0 standard drinks of alcohol    Comment: rare beer   Drug use: No   Sexual activity: Not Currently  Other Topics Concern   Not on file  Social History Narrative   Not on file   Social Determinants of Health   Financial Resource Strain:  Not on file  Food Insecurity: Not on file  Transportation Needs: Not on file  Physical Activity: Not on file  Stress: Not on file  Social Connections: Not on file  Intimate Partner Violence: Not on file    Allergies  Allergen Reactions   Simvastatin Other (See Comments)    Abdominal cramps   Nitrostat [Nitroglycerin] Other (See Comments)    Causes blood pressure to "bottom out"    Current Outpatient Medications  Medication Sig Dispense Refill   acetaminophen (TYLENOL) 500 MG tablet Take 500-1,000 mg by mouth every 6 (six) hours as needed for mild pain, moderate pain or headache.      allopurinol (ZYLOPRIM) 300 MG tablet Take 300 mg by mouth daily.     atorvastatin (  LIPITOR) 20 MG tablet TAKE 1 TABLET BY MOUTH DAILY 90 tablet 3   B Complex-C (B-COMPLEX WITH VITAMIN C) tablet Take 1 tablet by mouth daily.     ferric citrate (AURYXIA) 1 GM 210 MG(Fe) tablet Take 210 mg by mouth 3 (three) times daily with meals. Take 2 Pills after each meal     ferrous sulfate 325 (65 FE) MG tablet Take by mouth as needed. During dialysis     metoprolol succinate (TOPROL-XL) 25 MG 24 hr tablet TAKE 1 TABLET BY MOUTH DAILY 90 tablet 0   midodrine (PROAMATINE) 2.5 MG tablet TAKE 1 TABLET BY MOUTH IMMEDIATELY AFTER DIALYSIS THEN ANOTHER TABLET 4 HOURS LATER 90 tablet 0   pantoprazole (PROTONIX) 40 MG tablet Take 40 mg by mouth daily.     Tamsulosin HCl (FLOMAX) 0.4 MG CAPS Take 1 capsule (0.4 mg total) by mouth daily. 30 capsule 0   VITAMIN B1-B12 IJ Inject as directed. Once per week     No current facility-administered medications for this visit.    REVIEW OF SYSTEMS:  [X]  denotes positive finding, [ ]  denotes negative finding Cardiac  Comments:  Chest pain or chest pressure:    Shortness of breath upon exertion:    Short of breath when lying flat:    Irregular heart rhythm:        Vascular    Pain in calf, thigh, or hip brought on by ambulation:    Pain in feet at night that wakes you up from  your sleep:     Blood clot in your veins:    Leg swelling:         Pulmonary    Oxygen at home:    Productive cough:     Wheezing:         Neurologic    Sudden weakness in arms or legs:     Sudden numbness in arms or legs:     Sudden onset of difficulty speaking or slurred speech:    Temporary loss of vision in one eye:     Problems with dizziness:         Gastrointestinal    Blood in stool:     Vomited blood:         Genitourinary    Burning when urinating:     Blood in urine:        Psychiatric    Major depression:         Hematologic    Bleeding problems:    Problems with blood clotting too easily:        Skin    Rashes or ulcers:        Constitutional    Fever or chills:      PHYSICAL EXAM: Vitals:   11/22/22 1422  BP: 132/72  Pulse: 82  Temp: 98.3 F (36.8 C)  TempSrc: Temporal  SpO2: 98%  Weight: 163 lb 3.2 oz (74 kg)  Height: 5\' 6"  (1.676 m)    GENERAL: The patient is a well-nourished male, in no acute distress. The vital signs are documented above. CARDIAC: There is a regular rate and rhythm.  VASCULAR:  Right brachiobasilic fistula with great thrill Right radial pulse palpable No distal tissue loss PULMONARY: No respiratory distress. ABDOMEN: Soft and non-tender. MUSCULOSKELETAL: There are no major deformities or cyanosis. NEUROLOGIC: No focal weakness or paresthesias are detected. SKIN: There are no ulcers or rashes noted. PSYCHIATRIC: The patient has a normal affect.  DATA:   With right arm  fistula compression his second digit pressure went from 130 mm Hg to 178 mm Hg  Assessment/Plan:  82 y.o. male, with hx ESRD, atrial fibrillation, CAD, DVT, HTN, that presents for evaluation of possible steal syndrome in his right upper extremity.  Patient has a right basilic vein fistula created on 03/30/2020.  This was working well until about 7 or 8 weeks ago when he states this was infiltrated in dialysis and he got sudden pain in his upper arm  and also developed hematoma.  He was referred here with concern for steal syndrome which is an appropriate concern.  However, he clearly associates all the pain in his upper arm starting when his fistula was infiltrated in dialysis.  On exam there is no hematoma or any other fluid collection that needs to be evacuated.  This has all resolved.  I question whether dialysis could have stuck the nerve as well because he states they stuck through the fistula.  He describes neuropathic pain and cannot sleep.  I discussed taking a conservative approach.  He does have a palpable radial pulse at the wrist.  Most of his pain is in the upper arm where the fistula was infiltrated.  I did give him 300 mg of Neurontin at night for the next several months to see if this helps.  I will see him in 4 to 6 weeks to see how he progresses.  Ultimately may need an angiogram of his upper extremity to evaluate for steal if this does not resolve but again I am not sure this really is steal syndrome with a palpable pulse at the wrist.   Cephus Shelling, MD [facility]: [phone]

## 2022-11-24 DIAGNOSIS — R52 Pain, unspecified: Secondary | ICD-10-CM | POA: Diagnosis not present

## 2022-11-24 DIAGNOSIS — E876 Hypokalemia: Secondary | ICD-10-CM | POA: Diagnosis not present

## 2022-11-24 DIAGNOSIS — D631 Anemia in chronic kidney disease: Secondary | ICD-10-CM | POA: Diagnosis not present

## 2022-11-24 DIAGNOSIS — D689 Coagulation defect, unspecified: Secondary | ICD-10-CM | POA: Diagnosis not present

## 2022-11-24 DIAGNOSIS — Z992 Dependence on renal dialysis: Secondary | ICD-10-CM | POA: Diagnosis not present

## 2022-11-24 DIAGNOSIS — D509 Iron deficiency anemia, unspecified: Secondary | ICD-10-CM | POA: Diagnosis not present

## 2022-11-24 DIAGNOSIS — N186 End stage renal disease: Secondary | ICD-10-CM | POA: Diagnosis not present

## 2022-11-24 DIAGNOSIS — N2581 Secondary hyperparathyroidism of renal origin: Secondary | ICD-10-CM | POA: Diagnosis not present

## 2022-11-27 ENCOUNTER — Telehealth: Payer: Self-pay

## 2022-11-27 DIAGNOSIS — N2581 Secondary hyperparathyroidism of renal origin: Secondary | ICD-10-CM | POA: Diagnosis not present

## 2022-11-27 DIAGNOSIS — N186 End stage renal disease: Secondary | ICD-10-CM | POA: Diagnosis not present

## 2022-11-27 DIAGNOSIS — E876 Hypokalemia: Secondary | ICD-10-CM | POA: Diagnosis not present

## 2022-11-27 DIAGNOSIS — D631 Anemia in chronic kidney disease: Secondary | ICD-10-CM | POA: Diagnosis not present

## 2022-11-27 DIAGNOSIS — Z992 Dependence on renal dialysis: Secondary | ICD-10-CM | POA: Diagnosis not present

## 2022-11-27 DIAGNOSIS — D509 Iron deficiency anemia, unspecified: Secondary | ICD-10-CM | POA: Diagnosis not present

## 2022-11-27 DIAGNOSIS — D689 Coagulation defect, unspecified: Secondary | ICD-10-CM | POA: Diagnosis not present

## 2022-11-27 DIAGNOSIS — R52 Pain, unspecified: Secondary | ICD-10-CM | POA: Diagnosis not present

## 2022-11-27 NOTE — Telephone Encounter (Signed)
Pt called stating that he had a reaction to a medication and he was not taking it anymore.  Reviewed pt's chart, returned call for clarification, two identifiers used. Pt explained that he started the Gabapentin on Thursday. He called everyone on his phone around 4:00 AM, he didn't know anyone, had dizziness, felt "drunk", and his son found him walking down the road unsure of what he was doing. HD treatments have helped him feel better. His pain was also much improved and now is starting to return.  Informed him that a message will be sent to Dr. Chestine Spore for any other suggestions. Gabapentin discontinued and added to pt's allergy list. Confirmed understanding.

## 2022-11-29 DIAGNOSIS — D509 Iron deficiency anemia, unspecified: Secondary | ICD-10-CM | POA: Diagnosis not present

## 2022-11-29 DIAGNOSIS — N186 End stage renal disease: Secondary | ICD-10-CM | POA: Diagnosis not present

## 2022-11-29 DIAGNOSIS — E876 Hypokalemia: Secondary | ICD-10-CM | POA: Diagnosis not present

## 2022-11-29 DIAGNOSIS — D689 Coagulation defect, unspecified: Secondary | ICD-10-CM | POA: Diagnosis not present

## 2022-11-29 DIAGNOSIS — Z992 Dependence on renal dialysis: Secondary | ICD-10-CM | POA: Diagnosis not present

## 2022-11-29 DIAGNOSIS — N2581 Secondary hyperparathyroidism of renal origin: Secondary | ICD-10-CM | POA: Diagnosis not present

## 2022-11-29 DIAGNOSIS — D631 Anemia in chronic kidney disease: Secondary | ICD-10-CM | POA: Diagnosis not present

## 2022-11-29 DIAGNOSIS — R52 Pain, unspecified: Secondary | ICD-10-CM | POA: Diagnosis not present

## 2022-12-01 DIAGNOSIS — D689 Coagulation defect, unspecified: Secondary | ICD-10-CM | POA: Diagnosis not present

## 2022-12-01 DIAGNOSIS — D631 Anemia in chronic kidney disease: Secondary | ICD-10-CM | POA: Diagnosis not present

## 2022-12-01 DIAGNOSIS — E876 Hypokalemia: Secondary | ICD-10-CM | POA: Diagnosis not present

## 2022-12-01 DIAGNOSIS — N2581 Secondary hyperparathyroidism of renal origin: Secondary | ICD-10-CM | POA: Diagnosis not present

## 2022-12-01 DIAGNOSIS — Z992 Dependence on renal dialysis: Secondary | ICD-10-CM | POA: Diagnosis not present

## 2022-12-01 DIAGNOSIS — D509 Iron deficiency anemia, unspecified: Secondary | ICD-10-CM | POA: Diagnosis not present

## 2022-12-01 DIAGNOSIS — N186 End stage renal disease: Secondary | ICD-10-CM | POA: Diagnosis not present

## 2022-12-01 DIAGNOSIS — R52 Pain, unspecified: Secondary | ICD-10-CM | POA: Diagnosis not present

## 2022-12-03 DIAGNOSIS — D509 Iron deficiency anemia, unspecified: Secondary | ICD-10-CM | POA: Diagnosis not present

## 2022-12-03 DIAGNOSIS — D689 Coagulation defect, unspecified: Secondary | ICD-10-CM | POA: Diagnosis not present

## 2022-12-03 DIAGNOSIS — E876 Hypokalemia: Secondary | ICD-10-CM | POA: Diagnosis not present

## 2022-12-03 DIAGNOSIS — Z992 Dependence on renal dialysis: Secondary | ICD-10-CM | POA: Diagnosis not present

## 2022-12-03 DIAGNOSIS — N186 End stage renal disease: Secondary | ICD-10-CM | POA: Diagnosis not present

## 2022-12-03 DIAGNOSIS — D631 Anemia in chronic kidney disease: Secondary | ICD-10-CM | POA: Diagnosis not present

## 2022-12-03 DIAGNOSIS — N2581 Secondary hyperparathyroidism of renal origin: Secondary | ICD-10-CM | POA: Diagnosis not present

## 2022-12-03 DIAGNOSIS — R52 Pain, unspecified: Secondary | ICD-10-CM | POA: Diagnosis not present

## 2022-12-05 DIAGNOSIS — Z992 Dependence on renal dialysis: Secondary | ICD-10-CM | POA: Diagnosis not present

## 2022-12-05 DIAGNOSIS — N2581 Secondary hyperparathyroidism of renal origin: Secondary | ICD-10-CM | POA: Diagnosis not present

## 2022-12-05 DIAGNOSIS — D631 Anemia in chronic kidney disease: Secondary | ICD-10-CM | POA: Diagnosis not present

## 2022-12-05 DIAGNOSIS — R52 Pain, unspecified: Secondary | ICD-10-CM | POA: Diagnosis not present

## 2022-12-05 DIAGNOSIS — D689 Coagulation defect, unspecified: Secondary | ICD-10-CM | POA: Diagnosis not present

## 2022-12-05 DIAGNOSIS — D509 Iron deficiency anemia, unspecified: Secondary | ICD-10-CM | POA: Diagnosis not present

## 2022-12-05 DIAGNOSIS — E876 Hypokalemia: Secondary | ICD-10-CM | POA: Diagnosis not present

## 2022-12-05 DIAGNOSIS — N186 End stage renal disease: Secondary | ICD-10-CM | POA: Diagnosis not present

## 2022-12-05 NOTE — Progress Notes (Signed)
Remote pacemaker transmission.   

## 2022-12-08 DIAGNOSIS — D509 Iron deficiency anemia, unspecified: Secondary | ICD-10-CM | POA: Diagnosis not present

## 2022-12-08 DIAGNOSIS — D689 Coagulation defect, unspecified: Secondary | ICD-10-CM | POA: Diagnosis not present

## 2022-12-08 DIAGNOSIS — E876 Hypokalemia: Secondary | ICD-10-CM | POA: Diagnosis not present

## 2022-12-08 DIAGNOSIS — R52 Pain, unspecified: Secondary | ICD-10-CM | POA: Diagnosis not present

## 2022-12-08 DIAGNOSIS — Z992 Dependence on renal dialysis: Secondary | ICD-10-CM | POA: Diagnosis not present

## 2022-12-08 DIAGNOSIS — N186 End stage renal disease: Secondary | ICD-10-CM | POA: Diagnosis not present

## 2022-12-08 DIAGNOSIS — N2581 Secondary hyperparathyroidism of renal origin: Secondary | ICD-10-CM | POA: Diagnosis not present

## 2022-12-08 DIAGNOSIS — D631 Anemia in chronic kidney disease: Secondary | ICD-10-CM | POA: Diagnosis not present

## 2022-12-09 DIAGNOSIS — Z992 Dependence on renal dialysis: Secondary | ICD-10-CM | POA: Diagnosis not present

## 2022-12-09 DIAGNOSIS — I129 Hypertensive chronic kidney disease with stage 1 through stage 4 chronic kidney disease, or unspecified chronic kidney disease: Secondary | ICD-10-CM | POA: Diagnosis not present

## 2022-12-09 DIAGNOSIS — N186 End stage renal disease: Secondary | ICD-10-CM | POA: Diagnosis not present

## 2022-12-11 DIAGNOSIS — E876 Hypokalemia: Secondary | ICD-10-CM | POA: Diagnosis not present

## 2022-12-11 DIAGNOSIS — D689 Coagulation defect, unspecified: Secondary | ICD-10-CM | POA: Diagnosis not present

## 2022-12-11 DIAGNOSIS — D509 Iron deficiency anemia, unspecified: Secondary | ICD-10-CM | POA: Diagnosis not present

## 2022-12-11 DIAGNOSIS — Z992 Dependence on renal dialysis: Secondary | ICD-10-CM | POA: Diagnosis not present

## 2022-12-11 DIAGNOSIS — N186 End stage renal disease: Secondary | ICD-10-CM | POA: Diagnosis not present

## 2022-12-11 DIAGNOSIS — D631 Anemia in chronic kidney disease: Secondary | ICD-10-CM | POA: Diagnosis not present

## 2022-12-11 DIAGNOSIS — N2581 Secondary hyperparathyroidism of renal origin: Secondary | ICD-10-CM | POA: Diagnosis not present

## 2022-12-13 DIAGNOSIS — Z992 Dependence on renal dialysis: Secondary | ICD-10-CM | POA: Diagnosis not present

## 2022-12-13 DIAGNOSIS — D689 Coagulation defect, unspecified: Secondary | ICD-10-CM | POA: Diagnosis not present

## 2022-12-13 DIAGNOSIS — D509 Iron deficiency anemia, unspecified: Secondary | ICD-10-CM | POA: Diagnosis not present

## 2022-12-13 DIAGNOSIS — N2581 Secondary hyperparathyroidism of renal origin: Secondary | ICD-10-CM | POA: Diagnosis not present

## 2022-12-13 DIAGNOSIS — E876 Hypokalemia: Secondary | ICD-10-CM | POA: Diagnosis not present

## 2022-12-13 DIAGNOSIS — D631 Anemia in chronic kidney disease: Secondary | ICD-10-CM | POA: Diagnosis not present

## 2022-12-13 DIAGNOSIS — N186 End stage renal disease: Secondary | ICD-10-CM | POA: Diagnosis not present

## 2022-12-15 DIAGNOSIS — N186 End stage renal disease: Secondary | ICD-10-CM | POA: Diagnosis not present

## 2022-12-15 DIAGNOSIS — D509 Iron deficiency anemia, unspecified: Secondary | ICD-10-CM | POA: Diagnosis not present

## 2022-12-15 DIAGNOSIS — D689 Coagulation defect, unspecified: Secondary | ICD-10-CM | POA: Diagnosis not present

## 2022-12-15 DIAGNOSIS — Z992 Dependence on renal dialysis: Secondary | ICD-10-CM | POA: Diagnosis not present

## 2022-12-15 DIAGNOSIS — E876 Hypokalemia: Secondary | ICD-10-CM | POA: Diagnosis not present

## 2022-12-15 DIAGNOSIS — D631 Anemia in chronic kidney disease: Secondary | ICD-10-CM | POA: Diagnosis not present

## 2022-12-15 DIAGNOSIS — N2581 Secondary hyperparathyroidism of renal origin: Secondary | ICD-10-CM | POA: Diagnosis not present

## 2022-12-18 DIAGNOSIS — N186 End stage renal disease: Secondary | ICD-10-CM | POA: Diagnosis not present

## 2022-12-18 DIAGNOSIS — D689 Coagulation defect, unspecified: Secondary | ICD-10-CM | POA: Diagnosis not present

## 2022-12-18 DIAGNOSIS — Z992 Dependence on renal dialysis: Secondary | ICD-10-CM | POA: Diagnosis not present

## 2022-12-18 DIAGNOSIS — N2581 Secondary hyperparathyroidism of renal origin: Secondary | ICD-10-CM | POA: Diagnosis not present

## 2022-12-18 DIAGNOSIS — D509 Iron deficiency anemia, unspecified: Secondary | ICD-10-CM | POA: Diagnosis not present

## 2022-12-18 DIAGNOSIS — D631 Anemia in chronic kidney disease: Secondary | ICD-10-CM | POA: Diagnosis not present

## 2022-12-18 DIAGNOSIS — E876 Hypokalemia: Secondary | ICD-10-CM | POA: Diagnosis not present

## 2022-12-20 DIAGNOSIS — D631 Anemia in chronic kidney disease: Secondary | ICD-10-CM | POA: Diagnosis not present

## 2022-12-20 DIAGNOSIS — D689 Coagulation defect, unspecified: Secondary | ICD-10-CM | POA: Diagnosis not present

## 2022-12-20 DIAGNOSIS — E876 Hypokalemia: Secondary | ICD-10-CM | POA: Diagnosis not present

## 2022-12-20 DIAGNOSIS — N186 End stage renal disease: Secondary | ICD-10-CM | POA: Diagnosis not present

## 2022-12-20 DIAGNOSIS — N2581 Secondary hyperparathyroidism of renal origin: Secondary | ICD-10-CM | POA: Diagnosis not present

## 2022-12-20 DIAGNOSIS — Z992 Dependence on renal dialysis: Secondary | ICD-10-CM | POA: Diagnosis not present

## 2022-12-20 DIAGNOSIS — D509 Iron deficiency anemia, unspecified: Secondary | ICD-10-CM | POA: Diagnosis not present

## 2022-12-22 DIAGNOSIS — E876 Hypokalemia: Secondary | ICD-10-CM | POA: Diagnosis not present

## 2022-12-22 DIAGNOSIS — N186 End stage renal disease: Secondary | ICD-10-CM | POA: Diagnosis not present

## 2022-12-22 DIAGNOSIS — Z992 Dependence on renal dialysis: Secondary | ICD-10-CM | POA: Diagnosis not present

## 2022-12-22 DIAGNOSIS — D509 Iron deficiency anemia, unspecified: Secondary | ICD-10-CM | POA: Diagnosis not present

## 2022-12-22 DIAGNOSIS — D631 Anemia in chronic kidney disease: Secondary | ICD-10-CM | POA: Diagnosis not present

## 2022-12-22 DIAGNOSIS — D689 Coagulation defect, unspecified: Secondary | ICD-10-CM | POA: Diagnosis not present

## 2022-12-22 DIAGNOSIS — N2581 Secondary hyperparathyroidism of renal origin: Secondary | ICD-10-CM | POA: Diagnosis not present

## 2022-12-25 DIAGNOSIS — D509 Iron deficiency anemia, unspecified: Secondary | ICD-10-CM | POA: Diagnosis not present

## 2022-12-25 DIAGNOSIS — N186 End stage renal disease: Secondary | ICD-10-CM | POA: Diagnosis not present

## 2022-12-25 DIAGNOSIS — N2581 Secondary hyperparathyroidism of renal origin: Secondary | ICD-10-CM | POA: Diagnosis not present

## 2022-12-25 DIAGNOSIS — D631 Anemia in chronic kidney disease: Secondary | ICD-10-CM | POA: Diagnosis not present

## 2022-12-25 DIAGNOSIS — E876 Hypokalemia: Secondary | ICD-10-CM | POA: Diagnosis not present

## 2022-12-25 DIAGNOSIS — D689 Coagulation defect, unspecified: Secondary | ICD-10-CM | POA: Diagnosis not present

## 2022-12-25 DIAGNOSIS — Z992 Dependence on renal dialysis: Secondary | ICD-10-CM | POA: Diagnosis not present

## 2022-12-26 ENCOUNTER — Encounter: Payer: Self-pay | Admitting: Vascular Surgery

## 2022-12-26 ENCOUNTER — Ambulatory Visit (INDEPENDENT_AMBULATORY_CARE_PROVIDER_SITE_OTHER): Payer: Medicare Other | Admitting: Vascular Surgery

## 2022-12-26 VITALS — BP 131/79 | HR 63 | Temp 97.7°F | Resp 20 | Ht 66.0 in | Wt 161.4 lb

## 2022-12-26 DIAGNOSIS — N186 End stage renal disease: Secondary | ICD-10-CM | POA: Diagnosis not present

## 2022-12-26 MED ORDER — GABAPENTIN 100 MG PO CAPS: 100.0000 mg | ORAL_CAPSULE | Freq: Every day | ORAL | 2 refills | Status: DC

## 2022-12-26 NOTE — Progress Notes (Signed)
[name] MRN: [id number] DOB: [birth year] Sex: male  REASON FOR CONSULT: 4-6 week f/u, concern right hand steal  HPI: Paul Price is a 82 y.o. male, with hx ESRD, atrial fibrillation, CAD, DVT, HTN, that presents for 4-6 week follow-up with right arm pain during dialysis.  Previously seen with right upper arm pain after his fistula was infiltrated.  Patient has a right basilic vein fistula created on 03/30/2020.  We did not feel this was related to steal syndrome and likely infiltration of the fistula with possible injury to the nerve.  I did put him on 300 mg gabapentin at night.  He states his pain completely went away but he had mental status changes and had to stop the medicine.  Today states his right arm fistula is working well.  Still has pain around the fistula but all the swelling from infiltration has improved.  He has no pain in the hand.  No numbness or weakness in the hand.  Past Medical History:  Diagnosis Date   Arthritis    "shoulders" (09/07/2016)   Atrial fibrillation (HCC)    BPH (benign prostatic hyperplasia)    Chronic kidney disease (CKD), stage III (moderate) (HCC)    Coronary artery disease    DDD (degenerative disc disease), cervical    DVT (deep venous thrombosis) (HCC) 12/2013   LLE   GERD (gastroesophageal reflux disease)    Gout    High cholesterol    History of blood transfusion 09/06/2016   2 u PRBC   History of scarlet fever 1940s   History of stress test 06/2011   No significant ischemia, this is a low risk scan. Clinical correlation recommended Abnormal myocardial perfusion study.   Hx of echocardiogram 05/2009   EF 40-45%, he did have mild annular calcification with mild-to-moderate MR and mild TR as well as aortic valve sclerosis. Estimated RV systolic pressure was 21 mm.   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   Neck pain    "not chronic" (09/07/2016)   Obesity     OSA on CPAP    Presence of permanent cardiac pacemaker    RBBB    Renal insufficiency    Right renal mass    Spinal stenosis of lumbar region     Past Surgical History:  Procedure Laterality Date   APPENDECTOMY     AV FISTULA PLACEMENT Right 02/09/2020   Procedure: ARTERIOVENOUS (AV) FISTULA  CREATION RIGHT UPPER EXTREMITY;  Surgeon: Cephus Shelling, MD;  Location: [facility] OR;  Service: Vascular;  Laterality: Right;   BASCILIC VEIN TRANSPOSITION Right 03/30/2020   Procedure: RIGHT SECOND STAGE BASCILIC VEIN TRANSPOSITION;  Surgeon: Maeola Harman, MD;  Location: [facility] OR;  Service: Vascular;  Laterality: Right;   CARDIAC CATHETERIZATION  2009   he was found to have a atretic LIMA graft to his LAD and had a patent vein graft supplying his diagonal vessel. At that time, he had 70% narrowing in his LAD beyond a diagonal vessel which was initially treated medically.   COLONOSCOPY WITH PROPOFOL Left 09/09/2016   Procedure: COLONOSCOPY WITH PROPOFOL;  Surgeon: Willis Modena, MD;  Location: [facility] ENDOSCOPY;  Service: Endoscopy;  Laterality: Left;   CORONARY ANGIOPLASTY WITH STENT PLACEMENT  April 2012   LAD DES   CORONARY ARTERY BYPASS GRAFT  1992   with LIMA to the LAD and diagonal.   CYSTOSCOPY  N/A 03/31/2019   Procedure: CYSTOSCOPY FLEXIBLE;  Surgeon: Crista Elliot, MD;  Location: [facility] ORS;  Service: Urology;  Laterality: N/A;   CYSTOSCOPY WITH URETHRAL DILATATION N/A 05/21/2019   Procedure: CYSTOSCOPY WITH URETHRAL BALLOON DILATATION;  Surgeon: Crista Elliot, MD;  Location: [facility] ORS;  Service: Urology;  Laterality: N/A;   ESOPHAGOGASTRODUODENOSCOPY N/A 11/16/2019   Procedure: ESOPHAGOGASTRODUODENOSCOPY (EGD);  Surgeon: Charlott Rakes, MD;  Location: [facility] ENDOSCOPY;  Service: Endoscopy;  Laterality: N/A;   ESOPHAGOGASTRODUODENOSCOPY (EGD) WITH PROPOFOL Left 09/08/2016   Procedure: ESOPHAGOGASTRODUODENOSCOPY (EGD) WITH PROPOFOL;  Surgeon: Kerin Salen, MD;  Location: [facility] ENDOSCOPY;   Service: Gastroenterology;  Laterality: Left;   EXCHANGE OF A DIALYSIS CATHETER Right 02/09/2020   Procedure: EXCHANGE OF A DIALYSIS CATHETER;  Surgeon: Cephus Shelling, MD;  Location: [facility] OR;  Service: Vascular;  Laterality: Right;   GIVENS CAPSULE STUDY N/A 11/16/2019   Procedure: GIVENS CAPSULE STUDY;  Surgeon: Charlott Rakes, MD;  Location: [facility] ENDOSCOPY;  Service: Endoscopy;  Laterality: N/A;   INGUINAL HERNIA REPAIR     "don't remeimber which side"   IR FLUORO GUIDE CV LINE RIGHT  02/05/2020   IR US GUIDE VASC ACCESS RIGHT  02/05/2020   LAPAROSCOPIC NEPHRECTOMY Right 03/31/2019   Procedure: RIGHT  HAND ASSIST LAPAROSCOPIC NEPHRECTOMY;  Surgeon: Crista Elliot, MD;  Location: [facility] ORS;  Service: Urology;  Laterality: Right;   PACEMAKER IMPLANT N/A 12/25/2016   Procedure: PACEMAKER IMPLANT;  Surgeon: Duke Salvia, MD;  Location: [facility] INVASIVE CV LAB;  Service: Cardiovascular;  Laterality: N/A;   TEE WITHOUT CARDIOVERSION N/A 02/06/2020   Procedure: TRANSESOPHAGEAL ECHOCARDIOGRAM (TEE);  Surgeon: Chilton Si, MD;  Location: [facility] ENDOSCOPY;  Service: Cardiovascular;  Laterality: N/A;   TONSILLECTOMY      Family History  Problem Relation Age of Onset   Heart disease Father     SOCIAL HISTORY: Social History   Socioeconomic History   Marital status: Married    Spouse name: Not on file   Number of children: Not on file   Years of education: Not on file   Highest education level: Not on file  Occupational History   Not on file  Tobacco Use   Smoking status: Former    Current packs/day: 1.00    Average packs/day: 1 pack/day for 30.0 years (30.0 ttl pk-yrs)    Types: Cigarettes   Smokeless tobacco: Never   Tobacco comments:    quit ~ 1985  Vaping Use   Vaping status: Never Used  Substance and Sexual Activity   Alcohol use: Not Currently    Alcohol/week: 0.0 standard drinks of alcohol    Comment: rare beer   Drug use: No   Sexual activity: Not Currently  Other  Topics Concern   Not on file  Social History Narrative   Not on file   Social Drivers of Health   Financial Resource Strain: Not on file  Food Insecurity: Not on file  Transportation Needs: Not on file  Physical Activity: Not on file  Stress: Not on file  Social Connections: Not on file  Intimate Partner Violence: Not on file    Allergies  Allergen Reactions   Gabapentin Other (See Comments)    Dizziness Loss of mental capacity   Simvastatin Other (See Comments)    Abdominal cramps   Nitrostat [Nitroglycerin] Other (See Comments)    Causes blood pressure to "bottom out"    Current Outpatient Medications  Medication Sig Dispense Refill   acetaminophen (TYLENOL) 500  MG tablet Take 500-1,000 mg by mouth every 6 (six) hours as needed for mild pain, moderate pain or headache.      allopurinol (ZYLOPRIM) 300 MG tablet Take 300 mg by mouth daily.     atorvastatin (LIPITOR) 20 MG tablet TAKE 1 TABLET BY MOUTH DAILY 90 tablet 3   B Complex-C (B-COMPLEX WITH VITAMIN C) tablet Take 1 tablet by mouth daily.     ferric citrate (AURYXIA) 1 GM 210 MG(Fe) tablet Take 210 mg by mouth 3 (three) times daily with meals. Take 2 Pills after each meal     ferrous sulfate 325 (65 FE) MG tablet Take by mouth as needed. During dialysis     metoprolol succinate (TOPROL-XL) 25 MG 24 hr tablet TAKE 1 TABLET BY MOUTH DAILY 90 tablet 0   midodrine (PROAMATINE) 2.5 MG tablet TAKE 1 TABLET BY MOUTH IMMEDIATELY AFTER DIALYSIS THEN ANOTHER TABLET 4 HOURS LATER 90 tablet 0   pantoprazole (PROTONIX) 40 MG tablet Take 40 mg by mouth daily.     Tamsulosin HCl (FLOMAX) 0.4 MG CAPS Take 1 capsule (0.4 mg total) by mouth daily. 30 capsule 0   VITAMIN B1-B12 IJ Inject as directed. Once per week     No current facility-administered medications for this visit.    REVIEW OF SYSTEMS:  [X]  denotes positive finding, [ ]  denotes negative finding Cardiac  Comments:  Chest pain or chest pressure:    Shortness of breath  upon exertion:    Short of breath when lying flat:    Irregular heart rhythm:        Vascular    Pain in calf, thigh, or hip brought on by ambulation:    Pain in feet at night that wakes you up from your sleep:     Blood clot in your veins:    Leg swelling:         Pulmonary    Oxygen at home:    Productive cough:     Wheezing:         Neurologic    Sudden weakness in arms or legs:     Sudden numbness in arms or legs:     Sudden onset of difficulty speaking or slurred speech:    Temporary loss of vision in one eye:     Problems with dizziness:         Gastrointestinal    Blood in stool:     Vomited blood:         Genitourinary    Burning when urinating:     Blood in urine:        Psychiatric    Major depression:         Hematologic    Bleeding problems:    Problems with blood clotting too easily:        Skin    Rashes or ulcers:        Constitutional    Fever or chills:      PHYSICAL EXAM: There were no vitals filed for this visit.   GENERAL: The patient is a well-nourished male, in no acute distress. The vital signs are documented above. CARDIAC: There is a regular rate and rhythm.  VASCULAR:  Right brachiobasilic fistula with great thrill Right radial pulse palpable No distal tissue loss Right hand is motor and sensory intact  DATA:   Steal study showed right arm fistula compression his second digit pressure went from 130 mm Hg to 178 mm Hg  Assessment/Plan:  82  y.o. male, with hx ESRD, atrial fibrillation, CAD, DVT, HTN, that presents for 4-6 week f/u for ongoing evaluation of possible steal syndrome in his right upper extremity.  Patient has a right basilic vein fistula created on 03/30/2020.  This was working well until it got infiltrated several months ago when he started having right upper arm pain.  We felt this was likely related to the fistula being infiltrated and/or sticking a nerve and not steal syndrome.  Again today he has a palpable radial  pulse at the wrist and has no symptoms in the hand.  All his pain is in the upper arm around the fistula where it was infiltrated.  He saw significant improvement on gabapentin but unfortunately he had some changes in his mental status with 300 mg nightly.  I had a long discussion with him and his son.  He has been trying Tylenol.  I discussed we could try gabapentin at the lowest dose of 100 mg nightly.  I discussed if he has any ongoing issues with mental status changes his family can just stop the medicine.  I will see him again in 3 months.  Hoping this improves with conservative therapy.    Cephus Shelling, MD [facility] Office: [phone]

## 2022-12-27 DIAGNOSIS — D631 Anemia in chronic kidney disease: Secondary | ICD-10-CM | POA: Diagnosis not present

## 2022-12-27 DIAGNOSIS — N186 End stage renal disease: Secondary | ICD-10-CM | POA: Diagnosis not present

## 2022-12-27 DIAGNOSIS — D509 Iron deficiency anemia, unspecified: Secondary | ICD-10-CM | POA: Diagnosis not present

## 2022-12-27 DIAGNOSIS — D689 Coagulation defect, unspecified: Secondary | ICD-10-CM | POA: Diagnosis not present

## 2022-12-27 DIAGNOSIS — N2581 Secondary hyperparathyroidism of renal origin: Secondary | ICD-10-CM | POA: Diagnosis not present

## 2022-12-27 DIAGNOSIS — E876 Hypokalemia: Secondary | ICD-10-CM | POA: Diagnosis not present

## 2022-12-27 DIAGNOSIS — Z992 Dependence on renal dialysis: Secondary | ICD-10-CM | POA: Diagnosis not present

## 2022-12-29 DIAGNOSIS — N186 End stage renal disease: Secondary | ICD-10-CM | POA: Diagnosis not present

## 2022-12-29 DIAGNOSIS — D509 Iron deficiency anemia, unspecified: Secondary | ICD-10-CM | POA: Diagnosis not present

## 2022-12-29 DIAGNOSIS — D689 Coagulation defect, unspecified: Secondary | ICD-10-CM | POA: Diagnosis not present

## 2022-12-29 DIAGNOSIS — N2581 Secondary hyperparathyroidism of renal origin: Secondary | ICD-10-CM | POA: Diagnosis not present

## 2022-12-29 DIAGNOSIS — D631 Anemia in chronic kidney disease: Secondary | ICD-10-CM | POA: Diagnosis not present

## 2022-12-29 DIAGNOSIS — E876 Hypokalemia: Secondary | ICD-10-CM | POA: Diagnosis not present

## 2022-12-29 DIAGNOSIS — Z992 Dependence on renal dialysis: Secondary | ICD-10-CM | POA: Diagnosis not present

## 2022-12-31 DIAGNOSIS — D631 Anemia in chronic kidney disease: Secondary | ICD-10-CM | POA: Diagnosis not present

## 2022-12-31 DIAGNOSIS — N186 End stage renal disease: Secondary | ICD-10-CM | POA: Diagnosis not present

## 2022-12-31 DIAGNOSIS — N2581 Secondary hyperparathyroidism of renal origin: Secondary | ICD-10-CM | POA: Diagnosis not present

## 2022-12-31 DIAGNOSIS — Z992 Dependence on renal dialysis: Secondary | ICD-10-CM | POA: Diagnosis not present

## 2022-12-31 DIAGNOSIS — D689 Coagulation defect, unspecified: Secondary | ICD-10-CM | POA: Diagnosis not present

## 2022-12-31 DIAGNOSIS — D509 Iron deficiency anemia, unspecified: Secondary | ICD-10-CM | POA: Diagnosis not present

## 2022-12-31 DIAGNOSIS — E876 Hypokalemia: Secondary | ICD-10-CM | POA: Diagnosis not present

## 2023-01-02 DIAGNOSIS — N186 End stage renal disease: Secondary | ICD-10-CM | POA: Diagnosis not present

## 2023-01-02 DIAGNOSIS — N2581 Secondary hyperparathyroidism of renal origin: Secondary | ICD-10-CM | POA: Diagnosis not present

## 2023-01-02 DIAGNOSIS — D689 Coagulation defect, unspecified: Secondary | ICD-10-CM | POA: Diagnosis not present

## 2023-01-02 DIAGNOSIS — Z992 Dependence on renal dialysis: Secondary | ICD-10-CM | POA: Diagnosis not present

## 2023-01-02 DIAGNOSIS — D631 Anemia in chronic kidney disease: Secondary | ICD-10-CM | POA: Diagnosis not present

## 2023-01-02 DIAGNOSIS — E876 Hypokalemia: Secondary | ICD-10-CM | POA: Diagnosis not present

## 2023-01-02 DIAGNOSIS — D509 Iron deficiency anemia, unspecified: Secondary | ICD-10-CM | POA: Diagnosis not present

## 2023-01-05 ENCOUNTER — Other Ambulatory Visit: Payer: Self-pay | Admitting: Cardiovascular Disease

## 2023-01-05 DIAGNOSIS — Z992 Dependence on renal dialysis: Secondary | ICD-10-CM | POA: Diagnosis not present

## 2023-01-05 DIAGNOSIS — D631 Anemia in chronic kidney disease: Secondary | ICD-10-CM | POA: Diagnosis not present

## 2023-01-05 DIAGNOSIS — E876 Hypokalemia: Secondary | ICD-10-CM | POA: Diagnosis not present

## 2023-01-05 DIAGNOSIS — N2581 Secondary hyperparathyroidism of renal origin: Secondary | ICD-10-CM | POA: Diagnosis not present

## 2023-01-05 DIAGNOSIS — D689 Coagulation defect, unspecified: Secondary | ICD-10-CM | POA: Diagnosis not present

## 2023-01-05 DIAGNOSIS — N186 End stage renal disease: Secondary | ICD-10-CM | POA: Diagnosis not present

## 2023-01-05 DIAGNOSIS — D509 Iron deficiency anemia, unspecified: Secondary | ICD-10-CM | POA: Diagnosis not present

## 2023-01-07 DIAGNOSIS — D509 Iron deficiency anemia, unspecified: Secondary | ICD-10-CM | POA: Diagnosis not present

## 2023-01-07 DIAGNOSIS — E876 Hypokalemia: Secondary | ICD-10-CM | POA: Diagnosis not present

## 2023-01-07 DIAGNOSIS — D689 Coagulation defect, unspecified: Secondary | ICD-10-CM | POA: Diagnosis not present

## 2023-01-07 DIAGNOSIS — N186 End stage renal disease: Secondary | ICD-10-CM | POA: Diagnosis not present

## 2023-01-07 DIAGNOSIS — D631 Anemia in chronic kidney disease: Secondary | ICD-10-CM | POA: Diagnosis not present

## 2023-01-07 DIAGNOSIS — Z992 Dependence on renal dialysis: Secondary | ICD-10-CM | POA: Diagnosis not present

## 2023-01-07 DIAGNOSIS — N2581 Secondary hyperparathyroidism of renal origin: Secondary | ICD-10-CM | POA: Diagnosis not present

## 2023-01-09 DIAGNOSIS — N186 End stage renal disease: Secondary | ICD-10-CM | POA: Diagnosis not present

## 2023-01-09 DIAGNOSIS — I129 Hypertensive chronic kidney disease with stage 1 through stage 4 chronic kidney disease, or unspecified chronic kidney disease: Secondary | ICD-10-CM | POA: Diagnosis not present

## 2023-01-09 DIAGNOSIS — E876 Hypokalemia: Secondary | ICD-10-CM | POA: Diagnosis not present

## 2023-01-09 DIAGNOSIS — Z992 Dependence on renal dialysis: Secondary | ICD-10-CM | POA: Diagnosis not present

## 2023-01-09 DIAGNOSIS — D631 Anemia in chronic kidney disease: Secondary | ICD-10-CM | POA: Diagnosis not present

## 2023-01-09 DIAGNOSIS — D509 Iron deficiency anemia, unspecified: Secondary | ICD-10-CM | POA: Diagnosis not present

## 2023-01-09 DIAGNOSIS — D689 Coagulation defect, unspecified: Secondary | ICD-10-CM | POA: Diagnosis not present

## 2023-01-09 DIAGNOSIS — N2581 Secondary hyperparathyroidism of renal origin: Secondary | ICD-10-CM | POA: Diagnosis not present

## 2023-01-12 DIAGNOSIS — D631 Anemia in chronic kidney disease: Secondary | ICD-10-CM | POA: Diagnosis not present

## 2023-01-12 DIAGNOSIS — E876 Hypokalemia: Secondary | ICD-10-CM | POA: Diagnosis not present

## 2023-01-12 DIAGNOSIS — N186 End stage renal disease: Secondary | ICD-10-CM | POA: Diagnosis not present

## 2023-01-12 DIAGNOSIS — Z992 Dependence on renal dialysis: Secondary | ICD-10-CM | POA: Diagnosis not present

## 2023-01-12 DIAGNOSIS — N2581 Secondary hyperparathyroidism of renal origin: Secondary | ICD-10-CM | POA: Diagnosis not present

## 2023-01-12 DIAGNOSIS — D689 Coagulation defect, unspecified: Secondary | ICD-10-CM | POA: Diagnosis not present

## 2023-01-12 DIAGNOSIS — D509 Iron deficiency anemia, unspecified: Secondary | ICD-10-CM | POA: Diagnosis not present

## 2023-01-15 DIAGNOSIS — D631 Anemia in chronic kidney disease: Secondary | ICD-10-CM | POA: Diagnosis not present

## 2023-01-15 DIAGNOSIS — Z992 Dependence on renal dialysis: Secondary | ICD-10-CM | POA: Diagnosis not present

## 2023-01-15 DIAGNOSIS — N2581 Secondary hyperparathyroidism of renal origin: Secondary | ICD-10-CM | POA: Diagnosis not present

## 2023-01-15 DIAGNOSIS — D509 Iron deficiency anemia, unspecified: Secondary | ICD-10-CM | POA: Diagnosis not present

## 2023-01-15 DIAGNOSIS — D689 Coagulation defect, unspecified: Secondary | ICD-10-CM | POA: Diagnosis not present

## 2023-01-15 DIAGNOSIS — N186 End stage renal disease: Secondary | ICD-10-CM | POA: Diagnosis not present

## 2023-01-15 DIAGNOSIS — E876 Hypokalemia: Secondary | ICD-10-CM | POA: Diagnosis not present

## 2023-01-17 ENCOUNTER — Other Ambulatory Visit: Payer: Self-pay

## 2023-01-17 ENCOUNTER — Encounter (HOSPITAL_COMMUNITY): Payer: Self-pay | Admitting: Nephrology

## 2023-01-17 ENCOUNTER — Encounter (HOSPITAL_COMMUNITY)
Admission: RE | Disposition: A | Discharge status: Home / Self Care | Payer: Self-pay | Source: Home / Self Care | Attending: Nephrology

## 2023-01-17 ENCOUNTER — Ambulatory Visit (HOSPITAL_COMMUNITY)
Admission: RE | Admit: 2023-01-17 | Discharge: 2023-01-17 | Disposition: A | Discharge status: Home / Self Care | Payer: Medicare Other | Attending: Nephrology | Admitting: Nephrology

## 2023-01-17 DIAGNOSIS — T82858A Stenosis of vascular prosthetic devices, implants and grafts, initial encounter: Secondary | ICD-10-CM | POA: Diagnosis not present

## 2023-01-17 DIAGNOSIS — Z86718 Personal history of other venous thrombosis and embolism: Secondary | ICD-10-CM | POA: Diagnosis not present

## 2023-01-17 DIAGNOSIS — I2510 Atherosclerotic heart disease of native coronary artery without angina pectoris: Secondary | ICD-10-CM | POA: Diagnosis not present

## 2023-01-17 DIAGNOSIS — D509 Iron deficiency anemia, unspecified: Secondary | ICD-10-CM | POA: Diagnosis not present

## 2023-01-17 DIAGNOSIS — Z992 Dependence on renal dialysis: Secondary | ICD-10-CM | POA: Diagnosis not present

## 2023-01-17 DIAGNOSIS — D631 Anemia in chronic kidney disease: Secondary | ICD-10-CM | POA: Insufficient documentation

## 2023-01-17 DIAGNOSIS — Z951 Presence of aortocoronary bypass graft: Secondary | ICD-10-CM | POA: Insufficient documentation

## 2023-01-17 DIAGNOSIS — Z79899 Other long term (current) drug therapy: Secondary | ICD-10-CM | POA: Insufficient documentation

## 2023-01-17 DIAGNOSIS — I4891 Unspecified atrial fibrillation: Secondary | ICD-10-CM | POA: Diagnosis not present

## 2023-01-17 DIAGNOSIS — Y832 Surgical operation with anastomosis, bypass or graft as the cause of abnormal reaction of the patient, or of later complication, without mention of misadventure at the time of the procedure: Secondary | ICD-10-CM | POA: Insufficient documentation

## 2023-01-17 DIAGNOSIS — E876 Hypokalemia: Secondary | ICD-10-CM | POA: Diagnosis not present

## 2023-01-17 DIAGNOSIS — N186 End stage renal disease: Secondary | ICD-10-CM | POA: Diagnosis not present

## 2023-01-17 DIAGNOSIS — I120 Hypertensive chronic kidney disease with stage 5 chronic kidney disease or end stage renal disease: Secondary | ICD-10-CM | POA: Insufficient documentation

## 2023-01-17 DIAGNOSIS — N250 Renal osteodystrophy: Secondary | ICD-10-CM | POA: Insufficient documentation

## 2023-01-17 DIAGNOSIS — I871 Compression of vein: Secondary | ICD-10-CM | POA: Diagnosis not present

## 2023-01-17 DIAGNOSIS — Z87891 Personal history of nicotine dependence: Secondary | ICD-10-CM | POA: Diagnosis not present

## 2023-01-17 DIAGNOSIS — D689 Coagulation defect, unspecified: Secondary | ICD-10-CM | POA: Diagnosis not present

## 2023-01-17 DIAGNOSIS — N2581 Secondary hyperparathyroidism of renal origin: Secondary | ICD-10-CM | POA: Diagnosis not present

## 2023-01-17 HISTORY — PX: PERIPHERAL VASCULAR BALLOON ANGIOPLASTY: CATH118281

## 2023-01-17 HISTORY — PX: A/V FISTULAGRAM: CATH118298

## 2023-01-17 SURGERY — A/V FISTULAGRAM
Anesthesia: LOCAL | Laterality: Right

## 2023-01-17 MED ORDER — LIDOCAINE HCL (PF) 1 % IJ SOLN
INTRAMUSCULAR | Status: DC | PRN
  Administered 2023-01-17: 2 mL via INTRADERMAL

## 2023-01-17 MED ORDER — ACETAMINOPHEN 325 MG PO TABS: 650.0000 mg | ORAL_TABLET | ORAL | Status: DC | PRN

## 2023-01-17 MED ORDER — HEPARIN (PORCINE) IN NACL 1000-0.9 UT/500ML-% IV SOLN
INTRAVENOUS | Status: DC | PRN
  Administered 2023-01-17: 500 mL

## 2023-01-17 MED ORDER — FENTANYL CITRATE (PF) 100 MCG/2ML IJ SOLN
INTRAMUSCULAR | Status: DC | PRN
  Administered 2023-01-17: 50 ug via INTRAVENOUS

## 2023-01-17 MED ORDER — FENTANYL CITRATE (PF) 100 MCG/2ML IJ SOLN
INTRAMUSCULAR | Status: AC
  Filled 2023-01-17: qty 2

## 2023-01-17 MED ORDER — MIDAZOLAM HCL 2 MG/2ML IJ SOLN
INTRAMUSCULAR | Status: DC | PRN
  Administered 2023-01-17: 1 mg via INTRAVENOUS

## 2023-01-17 MED ORDER — SODIUM CHLORIDE 0.9% FLUSH: 3.0000 mL | INTRAVENOUS | Status: DC | PRN

## 2023-01-17 MED ORDER — ONDANSETRON HCL 4 MG/2ML IJ SOLN: 4.0000 mg | Freq: Four times a day (QID) | INTRAMUSCULAR | Status: DC | PRN

## 2023-01-17 MED ORDER — SODIUM CHLORIDE 0.9 % IV SOLN: INTRAVENOUS | Status: DC

## 2023-01-17 MED ORDER — MIDAZOLAM HCL 2 MG/2ML IJ SOLN
INTRAMUSCULAR | Status: AC
  Filled 2023-01-17: qty 2

## 2023-01-17 MED ORDER — LIDOCAINE HCL (PF) 1 % IJ SOLN
INTRAMUSCULAR | Status: AC
  Filled 2023-01-17: qty 30

## 2023-01-17 MED ORDER — IODIXANOL 320 MG/ML IV SOLN
INTRAVENOUS | Status: DC | PRN
  Administered 2023-01-17: 10 mL

## 2023-01-17 SURGICAL SUPPLY — 13 items
BAG SNAP BAND KOVER 36X36 (MISCELLANEOUS) ×2 IMPLANT
BALLN IN.PACT DCB 7X60 (BALLOONS) ×2
BALLN MUSTANG 7.0X40 75 (BALLOONS) ×2
BALLOON MUSTANG 7.0X40 75 (BALLOONS) IMPLANT
CATH SLIP KMP 65CM 5FR (CATHETERS) IMPLANT
COVER DOME SNAP 22 D (MISCELLANEOUS) ×2 IMPLANT
DCB IN.PACT 7X60 (BALLOONS) IMPLANT
GUIDEWIRE ANGLED .035X150CM (WIRE) IMPLANT
GUIDEWIRE ANGLED .035X260CM (WIRE) IMPLANT
SHEATH PINNACLE R/O II 6F 4CM (SHEATH) IMPLANT
SHEATH PINNACLE R/O II 7F 4CM (SHEATH) IMPLANT
SYR MEDALLION 10ML (SYRINGE) IMPLANT
TRAY PV CATH (CUSTOM PROCEDURE TRAY) ×2 IMPLANT

## 2023-01-17 NOTE — Op Note (Signed)
 Patient presents for concerns of decreased flows in his left BBT which was transposed on the 22nd 2022 by Dr. Sheree .   Patient has had inflow 7 mm angioplasty with the inflow  only 2-1/2 months ago.  On the physical exam, the fistula is hyperpulsatile at the inflow.  Summary:  1)      The patient had successful angioplasty (7 mm Mustang FE ~15 atm) of significant long 70% stenosis in the inflow basilic vein; given that the patient has been coming back every 2-2 and half months for repeat angioplasty of the same segment decision was made to treated with a drug-eluting balloon.  7 x 6 Impact DEB FE 8 atm 3-minute inflation of the inflow basilic vein stenotic interval. 2)      veins, arterial anastomosis were patent; certainly steal physiology present.  Flows improved after inflow angioplasty. 3)      This right BBT remains amenable to future percutaneous intervention as long as it remains patent at least 3 months.  Description of procedure: The arm was prepped and draped in the usual sterile fashion. The right upper arm brachial basilic fistula was cannulated (63097) with an 18G Angiocath needle directed in a retrograde direction in venous limb of the fistula. A guidewire was inserted and exchanged for a 6 Fr sheath. Contrast [phone]) injection via the side port of the sheath was performed. The angiogram of the fistula (63097) showed a patent outflow basilic swing site; the axillary vein, centrals, cannulation zone. There was a long 70% inflow basilic  vein stenosis vein half an inch away from the juxta segment..  The angled Glidewire was advanced and manipulated until the tip of the wire was in the proximal brachial artery.  Arteriogram revealed a patent brachial artery, arterial anastomosis but there was a long 70% inflow basilic vein stenosis. A 7 mm x4 Mustang angioplasty balloon was then inserted over the guidewire and positioned at the basilic vein inflow stenosis.   Venous angioplasty (63097) was  carried out to 16 ATM with FULL effacement of the waist on the balloon at the basilic inflow swing site lesion. The repeat angiogram showed 30% residual stenosis with no evidence of extravasation or dissection.  Patient was made to use a drug-eluting balloon because the patient has been requiring multiple angioplasties with secondary patency only approximately 1 to -2 and half months.  A 0.035 hydrophilic 260 cm glidewire was then inserted through the sheath and parked in the axillary artery. A 7x6  Impact DEB angioplasty balloon was then inserted over the guidewire and positioned at the basilic vein inflow swing site stenosis.   Venous angioplasty (63097) was carried out to 8 ATM with FULL effacement of the waist on the balloon at the basilic outflow swing site lesion for 3 minutes. The repeat angiogram showed 10% residual stenosis at the outflow basilic swing site  with no evidence of extravasation or dissection.  Hemostasis: A 3-0 ethilon purse string suture was placed at the cannulation site on removal of the sheath.  Sedation: 1 mg Versed , 50 mcg Fentanyl . Sedation time. 30  minutes  Contrast. 10 mL  Monitoring: Because of the patient's comorbid conditions and sedation during the procedure, continuous EKG monitoring and O2 saturation monitoring was performed throughout the procedure by the RN. There were no abnormal arrhythmias encountered.  Complications: None  Diagnoses: I87.1 Stricture of vein  N18.6 ESRD T82.858A Stricture of access  Procedure Coding:  [phone] Cannulation and angiogram of fistula, venous angioplasty (basilic vein inflow  swing site)  [id number] Contrast  Recommendations:  1. Continue to cannulate the fistula with 15G needles.  2. Refer for problems with flows/swelling. 3. Remove the suture next treatment.   Discharge: The patient was discharged home in stable condition. The patient was given education regarding the care of the dialysis access AVF and specific  instructions in case of any problems.

## 2023-01-17 NOTE — Discharge Instructions (Signed)

## 2023-01-17 NOTE — H&P (Signed)
 Chief Complaint: Decreased flows  Assessment/Plan: ESRD dialyzing  MWF regimen with last dialysis today Decreased access flows right brachiobasilic transposed fistula placed on March 30, 2020 by Dr. Sheree - planning on angiogram with possibly angioplasty; on examination the inflow is pulsatile. Renal osteodystrophy - continue binders per home regimen. Anemia - managed with ESA's and IV iron at dialysis center. HTN - resume home regimen. CASHD s/p CABG Afib   HPI: Paul Price is an 83 y.o. male   atrial fibrillation,CASHD s/p CABG, DVT, hypertension, end-stage renal disease dialyzing Monday Wednesday Fridays with last dialysis this morning.  Patient has a right basilic vein fistula placed on March 30, 2020.  Being referred for decreasing access flows.  Pt denies fever, chills, nausea, vomiting, myalgias, SOB, CP.   ROS Per HPI.  Chemistry and CBC: Creatinine  Date/Time Value Ref Range Status  10/20/2019 09:24 AM 3.18 (HH) 0.61 - 1.24 mg/dL Final    Comment:    CRITICAL RESULT CALLED TO, READ BACK BY AND VERIFIED WITH: DARICE ROMANO, RN AT 1020 BY P.SUTCAVAGE   08/12/2019 09:59 AM 3.14 (HH) 0.61 - 1.24 mg/dL Final    Comment:    CRITICAL RESULT CALLED TO, READ BACK BY AND VERIFIED WITH: DARICE ROMANO, RN AT 1112 BY P.SUTCAVAGE   01/15/2019 11:16 AM 2.06 (H) 0.61 - 1.24 mg/dL Final  90/77/7979 88:69 AM 2.47 (H) 0.61 - 1.24 mg/dL Final   Creat  Date/Time Value Ref Range Status  10/27/2015 08:03 AM 1.79 (H) 0.70 - 1.18 mg/dL Final    Comment:      For patients > or = 85 years of age: The upper reference limit for Creatinine is approximately 13% higher for people identified as African-American.     11/18/2014 08:39 AM 1.80 (H) 0.70 - 1.18 mg/dL Final  89/71/7984 90:73 AM 2.41 (H) 0.50 - 1.35 mg/dL Final  89/87/7984 90:95 AM 2.25 (H) 0.50 - 1.35 mg/dL Final  90/90/7984 95:50 PM 2.41 (H) 0.50 - 1.35 mg/dL Final  91/80/7984 90:94 AM 2.93 (H) 0.50 - 1.35 mg/dL Final  92/93/7984  90:66 AM 2.00 (H) 0.50 - 1.35 mg/dL Final   Creatinine, Ser  Date/Time Value Ref Range Status  03/30/2020 08:02 AM 3.10 (H) 0.61 - 1.24 mg/dL Final  97/96/7977 98:50 AM 3.58 (H) 0.61 - 1.24 mg/dL Final  97/97/7977 97:68 AM 3.23 (H) 0.61 - 1.24 mg/dL Final  97/98/7977 91:51 AM 2.63 (H) 0.61 - 1.24 mg/dL Final    Comment:    DELTA CHECK NOTED DIALYSIS   02/09/2020 04:20 AM 3.82 (H) 0.61 - 1.24 mg/dL Final  98/69/7977 98:48 AM 3.53 (H) 0.61 - 1.24 mg/dL Final  98/70/7977 96:77 AM 4.47 (H) 0.61 - 1.24 mg/dL Final  98/71/7977 98:53 AM 4.25 (H) 0.61 - 1.24 mg/dL Final  98/72/7977 97:60 AM 4.44 (H) 0.61 - 1.24 mg/dL Final  98/73/7977 97:86 AM 4.58 (H) 0.61 - 1.24 mg/dL Final  98/74/7977 97:49 PM 4.56 (H) 0.61 - 1.24 mg/dL Final  98/74/7977 97:60 AM 4.47 (H) 0.61 - 1.24 mg/dL Final  98/75/7977 87:44 AM 4.55 (H) 0.61 - 1.24 mg/dL Final  98/76/7977 95:81 PM 4.55 (H) 0.61 - 1.24 mg/dL Final  98/82/7977 96:49 AM 3.71 (H) 0.61 - 1.24 mg/dL Final  98/83/7977 97:94 AM 3.78 (H) 0.61 - 1.24 mg/dL Final  98/84/7977 97:64 AM 3.96 (H) 0.61 - 1.24 mg/dL Final  98/85/7977 97:84 AM 4.21 (H) 0.61 - 1.24 mg/dL Final  98/86/7977 94:81 AM 3.87 (H) 0.61 - 1.24 mg/dL Final  98/87/7977 93:99 AM  3.95 (H) 0.61 - 1.24 mg/dL Final  98/88/7977 94:42 PM 4.37 (H) 0.61 - 1.24 mg/dL Final  87/78/7978 90:99 AM 3.49 (H) 0.61 - 1.24 mg/dL Final  88/76/7978 89:64 AM 3.48 (H) 0.61 - 1.24 mg/dL Final  88/91/7978 95:81 AM 3.22 (H) 0.61 - 1.24 mg/dL Final  88/92/7978 90:98 AM 3.51 (H) 0.61 - 1.24 mg/dL Final  88/93/7978 93:83 AM 4.08 (H) 0.61 - 1.24 mg/dL Final  88/94/7978 98:93 PM 4.58 (H) 0.61 - 1.24 mg/dL Final  89/73/7978 89:52 AM 3.24 (H) 0.61 - 1.24 mg/dL Final  94/94/7978 96:79 PM 3.47 (H) 0.61 - 1.24 mg/dL Final  96/73/7978 94:70 AM 3.45 (H) 0.61 - 1.24 mg/dL Final  96/74/7978 94:92 AM 3.48 (H) 0.61 - 1.24 mg/dL Final  96/75/7978 94:84 AM 3.47 (H) 0.61 - 1.24 mg/dL Final  96/76/7978 94:97 AM 2.54 (H) 0.61 - 1.24  mg/dL Final  96/87/7978 87:58 PM 2.01 (H) 0.61 - 1.24 mg/dL Final  87/82/7981 94:88 AM 2.26 (H) 0.61 - 1.24 mg/dL Final  87/83/7981 96:64 AM 2.12 (H) 0.61 - 1.24 mg/dL Final  87/84/7981 95:89 AM 2.12 (H) 0.61 - 1.24 mg/dL Final  87/85/7981 94:64 AM 2.06 (H) 0.61 - 1.24 mg/dL Final  87/86/7981 93:47 AM 1.75 (H) 0.61 - 1.24 mg/dL Final  87/87/7981 94:68 PM 1.89 (H) 0.61 - 1.24 mg/dL Final  88/85/7981 89:42 AM 2.27 (H) 0.76 - 1.27 mg/dL Final  91/69/7981 97:45 AM 2.04 (H) 0.61 - 1.24 mg/dL Final  91/70/7981 97:81 PM 2.10 (H) 0.61 - 1.24 mg/dL Final  91/70/7981 98:85 PM 2.11 (H) 0.61 - 1.24 mg/dL Final  91/97/7981 92:51 AM 1.93 (H) 0.61 - 1.24 mg/dL Final  91/98/7981 93:94 AM 2.32 (H) 0.61 - 1.24 mg/dL Final  92/68/7981 96:69 PM 2.91 (H) 0.61 - 1.24 mg/dL Final  95/83/7983 91:91 AM 1.80 (H) 0.50 - 1.35 mg/dL Final  87/98/7984 95:54 AM 1.25 0.50 - 1.35 mg/dL Final  88/69/7984 95:62 AM 1.33 0.50 - 1.35 mg/dL Final  88/71/7984 97:64 AM 1.70 (H) 0.50 - 1.35 mg/dL Final  88/72/7984 93:89 AM 1.78 (H) 0.50 - 1.35 mg/dL Final   No results for input(s): NA, K, CL, CO2, GLUCOSE, BUN, CREATININE, CALCIUM , PHOS in the last 168 hours.  Invalid input(s): ALB No results for input(s): WBC, NEUTROABS, HGB, HCT, MCV, PLT in the last 168 hours. Liver Function Tests: No results for input(s): AST, ALT, ALKPHOS, BILITOT, PROT, ALBUMIN  in the last 168 hours. No results for input(s): LIPASE, AMYLASE in the last 168 hours. No results for input(s): AMMONIA in the last 168 hours. Cardiac Enzymes: No results for input(s): CKTOTAL, CKMB, CKMBINDEX, TROPONINI in the last 168 hours. Iron Studies: No results for input(s): IRON, TIBC, TRANSFERRIN, FERRITIN in the last 72 hours. PT/INR: @LABRCNTIP (inr:5)  Xrays/Other Studies: )No results found for this or any previous visit (from the past 48 hours). No results found.  PMH:   Past Medical History:   Diagnosis Date   Arthritis    shoulders (09/07/2016)   Atrial fibrillation (HCC)    BPH (benign prostatic hyperplasia)    Chronic kidney disease (CKD), stage III (moderate) (HCC)    Coronary artery disease    DDD (degenerative disc disease), cervical    DVT (deep venous thrombosis) (HCC) 12/2013   LLE   GERD (gastroesophageal reflux disease)    Gout    High cholesterol    History of blood transfusion 09/06/2016   2 u PRBC   History of scarlet fever 1940s   History of stress test 06/2011  No significant ischemia, this is a low risk scan. Clinical correlation recommended Abnormal myocardial perfusion study.   Hx of echocardiogram 05/2009   EF 40-45%, he did have mild annular calcification with mild-to-moderate MR and mild TR as well as aortic valve sclerosis. Estimated RV systolic pressure was 21 mm.   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   Neck pain    not chronic (09/07/2016)   Obesity    OSA on CPAP    Presence of permanent cardiac pacemaker    RBBB    Renal insufficiency    Right renal mass    Spinal stenosis of lumbar region     PSH:   Past Surgical History:  Procedure Laterality Date   APPENDECTOMY     AV FISTULA PLACEMENT Right 02/09/2020   Procedure: ARTERIOVENOUS (AV) FISTULA  CREATION RIGHT UPPER EXTREMITY;  Surgeon: Gretta Lonni PARAS, MD;  Location: [facility] OR;  Service: Vascular;  Laterality: Right;   BASCILIC VEIN TRANSPOSITION Right 03/30/2020   Procedure: RIGHT SECOND STAGE BASCILIC VEIN TRANSPOSITION;  Surgeon: Sheree Penne Lonni, MD;  Location: [facility] OR;  Service: Vascular;  Laterality: Right;   CARDIAC CATHETERIZATION  2009   he was found to have a atretic LIMA graft to his LAD and had a patent vein graft supplying his diagonal vessel. At that time, he had 70% narrowing in his LAD beyond a diagonal vessel which was initially treated medically.   COLONOSCOPY WITH PROPOFOL  Left 09/09/2016    Procedure: COLONOSCOPY WITH PROPOFOL ;  Surgeon: Burnette Fallow, MD;  Location: [facility] ENDOSCOPY;  Service: Endoscopy;  Laterality: Left;   CORONARY ANGIOPLASTY WITH STENT PLACEMENT  April 2012   LAD DES   CORONARY ARTERY BYPASS GRAFT  1992   with LIMA to the LAD and diagonal.   CYSTOSCOPY N/A 03/31/2019   Procedure: CYSTOSCOPY FLEXIBLE;  Surgeon: Carolee Sherwood JONETTA DOUGLAS, MD;  Location: [facility] ORS;  Service: Urology;  Laterality: N/A;   CYSTOSCOPY WITH URETHRAL DILATATION N/A 05/21/2019   Procedure: CYSTOSCOPY WITH URETHRAL BALLOON DILATATION;  Surgeon: Carolee Sherwood JONETTA DOUGLAS, MD;  Location: [facility] ORS;  Service: Urology;  Laterality: N/A;   ESOPHAGOGASTRODUODENOSCOPY N/A 11/16/2019   Procedure: ESOPHAGOGASTRODUODENOSCOPY (EGD);  Surgeon: Dianna Specking, MD;  Location: [facility] ENDOSCOPY;  Service: Endoscopy;  Laterality: N/A;   ESOPHAGOGASTRODUODENOSCOPY (EGD) WITH PROPOFOL  Left 09/08/2016   Procedure: ESOPHAGOGASTRODUODENOSCOPY (EGD) WITH PROPOFOL ;  Surgeon: Saintclair Jasper, MD;  Location: [facility] ENDOSCOPY;  Service: Gastroenterology;  Laterality: Left;   EXCHANGE OF A DIALYSIS CATHETER Right 02/09/2020   Procedure: EXCHANGE OF A DIALYSIS CATHETER;  Surgeon: Gretta Lonni PARAS, MD;  Location: [facility] OR;  Service: Vascular;  Laterality: Right;   GIVENS CAPSULE STUDY N/A 11/16/2019   Procedure: GIVENS CAPSULE STUDY;  Surgeon: Dianna Specking, MD;  Location: [facility] ENDOSCOPY;  Service: Endoscopy;  Laterality: N/A;   INGUINAL HERNIA REPAIR     don't remeimber which side   IR FLUORO GUIDE CV LINE RIGHT  02/05/2020   IR US  GUIDE VASC ACCESS RIGHT  02/05/2020   LAPAROSCOPIC NEPHRECTOMY Right 03/31/2019   Procedure: RIGHT  HAND ASSIST LAPAROSCOPIC NEPHRECTOMY;  Surgeon: Carolee Sherwood JONETTA DOUGLAS, MD;  Location: [facility] ORS;  Service: Urology;  Laterality: Right;   PACEMAKER IMPLANT N/A 12/25/2016   Procedure: PACEMAKER IMPLANT;  Surgeon: Fernande Elspeth BROCKS, MD;  Location: [facility] INVASIVE CV LAB;  Service: Cardiovascular;  Laterality: N/A;   TEE WITHOUT  CARDIOVERSION N/A 02/06/2020   Procedure:  TRANSESOPHAGEAL ECHOCARDIOGRAM (TEE);  Surgeon: Raford Riggs, MD;  Location: [facility] ENDOSCOPY;  Service: Cardiovascular;  Laterality: N/A;   TONSILLECTOMY      Allergies:  Allergies  Allergen Reactions   Gabapentin  Other (See Comments)    Dizziness Loss of mental capacity   Simvastatin Other (See Comments)    Abdominal cramps   Nitrostat [Nitroglycerin] Other (See Comments)    Causes blood pressure to bottom out    Medications:   Prior to Admission medications   Medication Sig Start Date End Date Taking? Authorizing Provider  acetaminophen  (TYLENOL ) 500 MG tablet Take 1,500-2,000 mg by mouth daily as needed for mild pain (pain score 1-3), moderate pain (pain score 4-6) or headache.   Yes [provider]  allopurinol  (ZYLOPRIM ) 300 MG tablet Take 300 mg by mouth daily.   Yes [provider]  atorvastatin  (LIPITOR) 20 MG tablet TAKE 1 TABLET BY MOUTH DAILY 01/05/23  Yes Burnard Debby LABOR, MD  B Complex-C (B-COMPLEX WITH VITAMIN C) tablet Take 1 tablet by mouth daily.   Yes [provider]  loperamide (IMODIUM A-D) 2 MG tablet Take 2 mg by mouth as needed for diarrhea or loose stools.   Yes [provider]  midodrine  (PROAMATINE ) 2.5 MG tablet TAKE 1 TABLET BY MOUTH IMMEDIATELY AFTER DIALYSIS THEN ANOTHER TABLET 4 HOURS LATER 10/05/22  Yes Fernande Elspeth BROCKS, MD  omeprazole (PRILOSEC OTC) 20 MG tablet Take 20 mg by mouth daily.   Yes [provider]  pantoprazole  (PROTONIX ) 40 MG tablet Take 40 mg by mouth daily.   Yes [provider]  aspirin  EC 81 MG tablet Take 81 mg by mouth daily. Swallow whole.    [provider]    Discontinued Meds:   Medications Discontinued During This Encounter  Medication Reason   gabapentin  (NEURONTIN ) 100 MG capsule Side effect (s)   metoprolol  succinate (TOPROL -XL) 25 MG 24 hr tablet Patient Preference   Tamsulosin  HCl (FLOMAX ) 0.4 MG CAPS Patient  Preference   ferric citrate (AURYXIA) 1 GM 210 MG(Fe) tablet Patient Preference   ferrous sulfate  325 (65 FE) MG tablet Patient Preference   VITAMIN B1-B12 IJ Patient Preference    Social History:  reports that he has quit smoking. His smoking use included cigarettes. He has a 30 pack-year smoking history. He has never used smokeless tobacco. He reports that he does not currently use alcohol. He reports that he does not use drugs.  Family History:   Family History  Problem Relation Age of Onset   Heart disease Father     GEN: NAD, A&Ox3, NCAT HEENT: No conjunctival pallor, EOMI NECK: Supple, no thyromegaly LUNGS: CTA B/L no rales, rhonchi or wheezing CV: RRR, No M/R/G ABD: SNDNT +BS  EXT: No lower extremity edema ACCESS: Rt BBT hyperpulsatile inflow       Xena Propst, LYNWOOD ORN, MD 01/17/2023, 12:39 PM

## 2023-01-18 ENCOUNTER — Encounter (HOSPITAL_COMMUNITY): Payer: Self-pay | Admitting: Nephrology

## 2023-01-19 DIAGNOSIS — N186 End stage renal disease: Secondary | ICD-10-CM | POA: Diagnosis not present

## 2023-01-19 DIAGNOSIS — N2581 Secondary hyperparathyroidism of renal origin: Secondary | ICD-10-CM | POA: Diagnosis not present

## 2023-01-19 DIAGNOSIS — Z992 Dependence on renal dialysis: Secondary | ICD-10-CM | POA: Diagnosis not present

## 2023-01-19 DIAGNOSIS — D509 Iron deficiency anemia, unspecified: Secondary | ICD-10-CM | POA: Diagnosis not present

## 2023-01-19 DIAGNOSIS — D689 Coagulation defect, unspecified: Secondary | ICD-10-CM | POA: Diagnosis not present

## 2023-01-19 DIAGNOSIS — D631 Anemia in chronic kidney disease: Secondary | ICD-10-CM | POA: Diagnosis not present

## 2023-01-19 DIAGNOSIS — E876 Hypokalemia: Secondary | ICD-10-CM | POA: Diagnosis not present

## 2023-01-22 DIAGNOSIS — D689 Coagulation defect, unspecified: Secondary | ICD-10-CM | POA: Diagnosis not present

## 2023-01-22 DIAGNOSIS — E876 Hypokalemia: Secondary | ICD-10-CM | POA: Diagnosis not present

## 2023-01-22 DIAGNOSIS — D631 Anemia in chronic kidney disease: Secondary | ICD-10-CM | POA: Diagnosis not present

## 2023-01-22 DIAGNOSIS — N186 End stage renal disease: Secondary | ICD-10-CM | POA: Diagnosis not present

## 2023-01-22 DIAGNOSIS — Z992 Dependence on renal dialysis: Secondary | ICD-10-CM | POA: Diagnosis not present

## 2023-01-22 DIAGNOSIS — N2581 Secondary hyperparathyroidism of renal origin: Secondary | ICD-10-CM | POA: Diagnosis not present

## 2023-01-22 DIAGNOSIS — D509 Iron deficiency anemia, unspecified: Secondary | ICD-10-CM | POA: Diagnosis not present

## 2023-01-23 DIAGNOSIS — M19011 Primary osteoarthritis, right shoulder: Secondary | ICD-10-CM | POA: Diagnosis not present

## 2023-01-24 DIAGNOSIS — N2581 Secondary hyperparathyroidism of renal origin: Secondary | ICD-10-CM | POA: Diagnosis not present

## 2023-01-24 DIAGNOSIS — E876 Hypokalemia: Secondary | ICD-10-CM | POA: Diagnosis not present

## 2023-01-24 DIAGNOSIS — Z992 Dependence on renal dialysis: Secondary | ICD-10-CM | POA: Diagnosis not present

## 2023-01-24 DIAGNOSIS — D631 Anemia in chronic kidney disease: Secondary | ICD-10-CM | POA: Diagnosis not present

## 2023-01-24 DIAGNOSIS — N186 End stage renal disease: Secondary | ICD-10-CM | POA: Diagnosis not present

## 2023-01-24 DIAGNOSIS — D509 Iron deficiency anemia, unspecified: Secondary | ICD-10-CM | POA: Diagnosis not present

## 2023-01-24 DIAGNOSIS — D689 Coagulation defect, unspecified: Secondary | ICD-10-CM | POA: Diagnosis not present

## 2023-01-26 DIAGNOSIS — Z992 Dependence on renal dialysis: Secondary | ICD-10-CM | POA: Diagnosis not present

## 2023-01-26 DIAGNOSIS — N2581 Secondary hyperparathyroidism of renal origin: Secondary | ICD-10-CM | POA: Diagnosis not present

## 2023-01-26 DIAGNOSIS — D631 Anemia in chronic kidney disease: Secondary | ICD-10-CM | POA: Diagnosis not present

## 2023-01-26 DIAGNOSIS — N186 End stage renal disease: Secondary | ICD-10-CM | POA: Diagnosis not present

## 2023-01-26 DIAGNOSIS — D509 Iron deficiency anemia, unspecified: Secondary | ICD-10-CM | POA: Diagnosis not present

## 2023-01-26 DIAGNOSIS — E876 Hypokalemia: Secondary | ICD-10-CM | POA: Diagnosis not present

## 2023-01-26 DIAGNOSIS — D689 Coagulation defect, unspecified: Secondary | ICD-10-CM | POA: Diagnosis not present

## 2023-01-29 DIAGNOSIS — Z992 Dependence on renal dialysis: Secondary | ICD-10-CM | POA: Diagnosis not present

## 2023-01-29 DIAGNOSIS — D509 Iron deficiency anemia, unspecified: Secondary | ICD-10-CM | POA: Diagnosis not present

## 2023-01-29 DIAGNOSIS — E876 Hypokalemia: Secondary | ICD-10-CM | POA: Diagnosis not present

## 2023-01-29 DIAGNOSIS — N2581 Secondary hyperparathyroidism of renal origin: Secondary | ICD-10-CM | POA: Diagnosis not present

## 2023-01-29 DIAGNOSIS — D631 Anemia in chronic kidney disease: Secondary | ICD-10-CM | POA: Diagnosis not present

## 2023-01-29 DIAGNOSIS — N186 End stage renal disease: Secondary | ICD-10-CM | POA: Diagnosis not present

## 2023-01-29 DIAGNOSIS — D689 Coagulation defect, unspecified: Secondary | ICD-10-CM | POA: Diagnosis not present

## 2023-01-31 DIAGNOSIS — D509 Iron deficiency anemia, unspecified: Secondary | ICD-10-CM | POA: Diagnosis not present

## 2023-01-31 DIAGNOSIS — D631 Anemia in chronic kidney disease: Secondary | ICD-10-CM | POA: Diagnosis not present

## 2023-01-31 DIAGNOSIS — N2581 Secondary hyperparathyroidism of renal origin: Secondary | ICD-10-CM | POA: Diagnosis not present

## 2023-01-31 DIAGNOSIS — Z992 Dependence on renal dialysis: Secondary | ICD-10-CM | POA: Diagnosis not present

## 2023-01-31 DIAGNOSIS — E876 Hypokalemia: Secondary | ICD-10-CM | POA: Diagnosis not present

## 2023-01-31 DIAGNOSIS — D689 Coagulation defect, unspecified: Secondary | ICD-10-CM | POA: Diagnosis not present

## 2023-01-31 DIAGNOSIS — N186 End stage renal disease: Secondary | ICD-10-CM | POA: Diagnosis not present

## 2023-02-02 DIAGNOSIS — D689 Coagulation defect, unspecified: Secondary | ICD-10-CM | POA: Diagnosis not present

## 2023-02-02 DIAGNOSIS — D631 Anemia in chronic kidney disease: Secondary | ICD-10-CM | POA: Diagnosis not present

## 2023-02-02 DIAGNOSIS — Z992 Dependence on renal dialysis: Secondary | ICD-10-CM | POA: Diagnosis not present

## 2023-02-02 DIAGNOSIS — N2581 Secondary hyperparathyroidism of renal origin: Secondary | ICD-10-CM | POA: Diagnosis not present

## 2023-02-02 DIAGNOSIS — E876 Hypokalemia: Secondary | ICD-10-CM | POA: Diagnosis not present

## 2023-02-02 DIAGNOSIS — N186 End stage renal disease: Secondary | ICD-10-CM | POA: Diagnosis not present

## 2023-02-02 DIAGNOSIS — D509 Iron deficiency anemia, unspecified: Secondary | ICD-10-CM | POA: Diagnosis not present

## 2023-02-05 DIAGNOSIS — D509 Iron deficiency anemia, unspecified: Secondary | ICD-10-CM | POA: Diagnosis not present

## 2023-02-05 DIAGNOSIS — D631 Anemia in chronic kidney disease: Secondary | ICD-10-CM | POA: Diagnosis not present

## 2023-02-05 DIAGNOSIS — Z992 Dependence on renal dialysis: Secondary | ICD-10-CM | POA: Diagnosis not present

## 2023-02-05 DIAGNOSIS — E876 Hypokalemia: Secondary | ICD-10-CM | POA: Diagnosis not present

## 2023-02-05 DIAGNOSIS — N2581 Secondary hyperparathyroidism of renal origin: Secondary | ICD-10-CM | POA: Diagnosis not present

## 2023-02-05 DIAGNOSIS — D689 Coagulation defect, unspecified: Secondary | ICD-10-CM | POA: Diagnosis not present

## 2023-02-05 DIAGNOSIS — N186 End stage renal disease: Secondary | ICD-10-CM | POA: Diagnosis not present

## 2023-02-07 DIAGNOSIS — D631 Anemia in chronic kidney disease: Secondary | ICD-10-CM | POA: Diagnosis not present

## 2023-02-07 DIAGNOSIS — N2581 Secondary hyperparathyroidism of renal origin: Secondary | ICD-10-CM | POA: Diagnosis not present

## 2023-02-07 DIAGNOSIS — D509 Iron deficiency anemia, unspecified: Secondary | ICD-10-CM | POA: Diagnosis not present

## 2023-02-07 DIAGNOSIS — D689 Coagulation defect, unspecified: Secondary | ICD-10-CM | POA: Diagnosis not present

## 2023-02-07 DIAGNOSIS — Z992 Dependence on renal dialysis: Secondary | ICD-10-CM | POA: Diagnosis not present

## 2023-02-07 DIAGNOSIS — E876 Hypokalemia: Secondary | ICD-10-CM | POA: Diagnosis not present

## 2023-02-07 DIAGNOSIS — N186 End stage renal disease: Secondary | ICD-10-CM | POA: Diagnosis not present

## 2023-02-09 DIAGNOSIS — Z992 Dependence on renal dialysis: Secondary | ICD-10-CM | POA: Diagnosis not present

## 2023-02-09 DIAGNOSIS — D509 Iron deficiency anemia, unspecified: Secondary | ICD-10-CM | POA: Diagnosis not present

## 2023-02-09 DIAGNOSIS — N2581 Secondary hyperparathyroidism of renal origin: Secondary | ICD-10-CM | POA: Diagnosis not present

## 2023-02-09 DIAGNOSIS — I129 Hypertensive chronic kidney disease with stage 1 through stage 4 chronic kidney disease, or unspecified chronic kidney disease: Secondary | ICD-10-CM | POA: Diagnosis not present

## 2023-02-09 DIAGNOSIS — E876 Hypokalemia: Secondary | ICD-10-CM | POA: Diagnosis not present

## 2023-02-09 DIAGNOSIS — D689 Coagulation defect, unspecified: Secondary | ICD-10-CM | POA: Diagnosis not present

## 2023-02-09 DIAGNOSIS — D631 Anemia in chronic kidney disease: Secondary | ICD-10-CM | POA: Diagnosis not present

## 2023-02-09 DIAGNOSIS — N186 End stage renal disease: Secondary | ICD-10-CM | POA: Diagnosis not present

## 2023-02-12 DIAGNOSIS — D631 Anemia in chronic kidney disease: Secondary | ICD-10-CM | POA: Diagnosis not present

## 2023-02-12 DIAGNOSIS — D509 Iron deficiency anemia, unspecified: Secondary | ICD-10-CM | POA: Diagnosis not present

## 2023-02-12 DIAGNOSIS — N2581 Secondary hyperparathyroidism of renal origin: Secondary | ICD-10-CM | POA: Diagnosis not present

## 2023-02-12 DIAGNOSIS — N186 End stage renal disease: Secondary | ICD-10-CM | POA: Diagnosis not present

## 2023-02-12 DIAGNOSIS — D689 Coagulation defect, unspecified: Secondary | ICD-10-CM | POA: Diagnosis not present

## 2023-02-12 DIAGNOSIS — R52 Pain, unspecified: Secondary | ICD-10-CM | POA: Diagnosis not present

## 2023-02-12 DIAGNOSIS — E876 Hypokalemia: Secondary | ICD-10-CM | POA: Diagnosis not present

## 2023-02-12 DIAGNOSIS — Z992 Dependence on renal dialysis: Secondary | ICD-10-CM | POA: Diagnosis not present

## 2023-02-14 ENCOUNTER — Ambulatory Visit: Payer: Medicare Other

## 2023-02-14 DIAGNOSIS — D631 Anemia in chronic kidney disease: Secondary | ICD-10-CM | POA: Diagnosis not present

## 2023-02-14 DIAGNOSIS — D509 Iron deficiency anemia, unspecified: Secondary | ICD-10-CM | POA: Diagnosis not present

## 2023-02-14 DIAGNOSIS — N2581 Secondary hyperparathyroidism of renal origin: Secondary | ICD-10-CM | POA: Diagnosis not present

## 2023-02-14 DIAGNOSIS — D689 Coagulation defect, unspecified: Secondary | ICD-10-CM | POA: Diagnosis not present

## 2023-02-14 DIAGNOSIS — N186 End stage renal disease: Secondary | ICD-10-CM | POA: Diagnosis not present

## 2023-02-14 DIAGNOSIS — I442 Atrioventricular block, complete: Secondary | ICD-10-CM | POA: Diagnosis not present

## 2023-02-14 DIAGNOSIS — R52 Pain, unspecified: Secondary | ICD-10-CM | POA: Diagnosis not present

## 2023-02-14 DIAGNOSIS — Z992 Dependence on renal dialysis: Secondary | ICD-10-CM | POA: Diagnosis not present

## 2023-02-14 DIAGNOSIS — E876 Hypokalemia: Secondary | ICD-10-CM | POA: Diagnosis not present

## 2023-02-15 LAB — CUP PACEART REMOTE DEVICE CHECK
Battery Remaining Longevity: 27 mo
Battery Voltage: 2.84 V
Brady Statistic RA Percent Paced: 0.55 %
Brady Statistic RV Percent Paced: 98.67 %
Date Time Interrogation Session: 20250206025843
Implantable Lead Connection Status: 753985
Implantable Lead Connection Status: 753985
Implantable Lead Implant Date: 20181217
Implantable Lead Implant Date: 20181217
Implantable Lead Location: 753859
Implantable Lead Location: 753859
Implantable Lead Model: 3830
Implantable Lead Model: 5076
Implantable Pulse Generator Implant Date: 20181217
Lead Channel Impedance Value: 304 Ohm
Lead Channel Impedance Value: 323 Ohm
Lead Channel Impedance Value: 399 Ohm
Lead Channel Impedance Value: 456 Ohm
Lead Channel Pacing Threshold Amplitude: 0.5 V
Lead Channel Pacing Threshold Amplitude: 0.75 V
Lead Channel Pacing Threshold Pulse Width: 0.4 ms
Lead Channel Pacing Threshold Pulse Width: 0.4 ms
Lead Channel Sensing Intrinsic Amplitude: 0.25 mV
Lead Channel Sensing Intrinsic Amplitude: 0.25 mV
Lead Channel Sensing Intrinsic Amplitude: 12.125 mV
Lead Channel Sensing Intrinsic Amplitude: 12.125 mV
Lead Channel Setting Pacing Amplitude: 2 V
Lead Channel Setting Pacing Amplitude: 2.5 V
Lead Channel Setting Pacing Pulse Width: 1 ms
Lead Channel Setting Sensing Sensitivity: 1.2 mV
Zone Setting Status: 755011
Zone Setting Status: 755011

## 2023-02-16 DIAGNOSIS — D631 Anemia in chronic kidney disease: Secondary | ICD-10-CM | POA: Diagnosis not present

## 2023-02-16 DIAGNOSIS — E876 Hypokalemia: Secondary | ICD-10-CM | POA: Diagnosis not present

## 2023-02-16 DIAGNOSIS — R52 Pain, unspecified: Secondary | ICD-10-CM | POA: Diagnosis not present

## 2023-02-16 DIAGNOSIS — N2581 Secondary hyperparathyroidism of renal origin: Secondary | ICD-10-CM | POA: Diagnosis not present

## 2023-02-16 DIAGNOSIS — D509 Iron deficiency anemia, unspecified: Secondary | ICD-10-CM | POA: Diagnosis not present

## 2023-02-16 DIAGNOSIS — D689 Coagulation defect, unspecified: Secondary | ICD-10-CM | POA: Diagnosis not present

## 2023-02-16 DIAGNOSIS — Z992 Dependence on renal dialysis: Secondary | ICD-10-CM | POA: Diagnosis not present

## 2023-02-16 DIAGNOSIS — N186 End stage renal disease: Secondary | ICD-10-CM | POA: Diagnosis not present

## 2023-02-19 DIAGNOSIS — D509 Iron deficiency anemia, unspecified: Secondary | ICD-10-CM | POA: Diagnosis not present

## 2023-02-19 DIAGNOSIS — D689 Coagulation defect, unspecified: Secondary | ICD-10-CM | POA: Diagnosis not present

## 2023-02-19 DIAGNOSIS — C641 Malignant neoplasm of right kidney, except renal pelvis: Secondary | ICD-10-CM | POA: Diagnosis not present

## 2023-02-19 DIAGNOSIS — D631 Anemia in chronic kidney disease: Secondary | ICD-10-CM | POA: Diagnosis not present

## 2023-02-19 DIAGNOSIS — E876 Hypokalemia: Secondary | ICD-10-CM | POA: Diagnosis not present

## 2023-02-19 DIAGNOSIS — N2581 Secondary hyperparathyroidism of renal origin: Secondary | ICD-10-CM | POA: Diagnosis not present

## 2023-02-19 DIAGNOSIS — Z992 Dependence on renal dialysis: Secondary | ICD-10-CM | POA: Diagnosis not present

## 2023-02-19 DIAGNOSIS — N186 End stage renal disease: Secondary | ICD-10-CM | POA: Diagnosis not present

## 2023-02-19 DIAGNOSIS — R52 Pain, unspecified: Secondary | ICD-10-CM | POA: Diagnosis not present

## 2023-02-21 DIAGNOSIS — N186 End stage renal disease: Secondary | ICD-10-CM | POA: Diagnosis not present

## 2023-02-21 DIAGNOSIS — N2581 Secondary hyperparathyroidism of renal origin: Secondary | ICD-10-CM | POA: Diagnosis not present

## 2023-02-21 DIAGNOSIS — D509 Iron deficiency anemia, unspecified: Secondary | ICD-10-CM | POA: Diagnosis not present

## 2023-02-21 DIAGNOSIS — Z992 Dependence on renal dialysis: Secondary | ICD-10-CM | POA: Diagnosis not present

## 2023-02-21 DIAGNOSIS — E876 Hypokalemia: Secondary | ICD-10-CM | POA: Diagnosis not present

## 2023-02-21 DIAGNOSIS — D689 Coagulation defect, unspecified: Secondary | ICD-10-CM | POA: Diagnosis not present

## 2023-02-21 DIAGNOSIS — R52 Pain, unspecified: Secondary | ICD-10-CM | POA: Diagnosis not present

## 2023-02-21 DIAGNOSIS — D631 Anemia in chronic kidney disease: Secondary | ICD-10-CM | POA: Diagnosis not present

## 2023-02-23 DIAGNOSIS — D509 Iron deficiency anemia, unspecified: Secondary | ICD-10-CM | POA: Diagnosis not present

## 2023-02-23 DIAGNOSIS — D631 Anemia in chronic kidney disease: Secondary | ICD-10-CM | POA: Diagnosis not present

## 2023-02-23 DIAGNOSIS — E876 Hypokalemia: Secondary | ICD-10-CM | POA: Diagnosis not present

## 2023-02-23 DIAGNOSIS — D689 Coagulation defect, unspecified: Secondary | ICD-10-CM | POA: Diagnosis not present

## 2023-02-23 DIAGNOSIS — N2581 Secondary hyperparathyroidism of renal origin: Secondary | ICD-10-CM | POA: Diagnosis not present

## 2023-02-23 DIAGNOSIS — R52 Pain, unspecified: Secondary | ICD-10-CM | POA: Diagnosis not present

## 2023-02-23 DIAGNOSIS — Z992 Dependence on renal dialysis: Secondary | ICD-10-CM | POA: Diagnosis not present

## 2023-02-23 DIAGNOSIS — N186 End stage renal disease: Secondary | ICD-10-CM | POA: Diagnosis not present

## 2023-02-26 DIAGNOSIS — N2581 Secondary hyperparathyroidism of renal origin: Secondary | ICD-10-CM | POA: Diagnosis not present

## 2023-02-26 DIAGNOSIS — Z992 Dependence on renal dialysis: Secondary | ICD-10-CM | POA: Diagnosis not present

## 2023-02-26 DIAGNOSIS — E876 Hypokalemia: Secondary | ICD-10-CM | POA: Diagnosis not present

## 2023-02-26 DIAGNOSIS — D631 Anemia in chronic kidney disease: Secondary | ICD-10-CM | POA: Diagnosis not present

## 2023-02-26 DIAGNOSIS — D689 Coagulation defect, unspecified: Secondary | ICD-10-CM | POA: Diagnosis not present

## 2023-02-26 DIAGNOSIS — R52 Pain, unspecified: Secondary | ICD-10-CM | POA: Diagnosis not present

## 2023-02-26 DIAGNOSIS — D509 Iron deficiency anemia, unspecified: Secondary | ICD-10-CM | POA: Diagnosis not present

## 2023-02-26 DIAGNOSIS — N186 End stage renal disease: Secondary | ICD-10-CM | POA: Diagnosis not present

## 2023-02-28 DIAGNOSIS — D509 Iron deficiency anemia, unspecified: Secondary | ICD-10-CM | POA: Diagnosis not present

## 2023-02-28 DIAGNOSIS — N2581 Secondary hyperparathyroidism of renal origin: Secondary | ICD-10-CM | POA: Diagnosis not present

## 2023-02-28 DIAGNOSIS — Z992 Dependence on renal dialysis: Secondary | ICD-10-CM | POA: Diagnosis not present

## 2023-02-28 DIAGNOSIS — R52 Pain, unspecified: Secondary | ICD-10-CM | POA: Diagnosis not present

## 2023-02-28 DIAGNOSIS — N186 End stage renal disease: Secondary | ICD-10-CM | POA: Diagnosis not present

## 2023-02-28 DIAGNOSIS — E876 Hypokalemia: Secondary | ICD-10-CM | POA: Diagnosis not present

## 2023-02-28 DIAGNOSIS — D631 Anemia in chronic kidney disease: Secondary | ICD-10-CM | POA: Diagnosis not present

## 2023-02-28 DIAGNOSIS — D689 Coagulation defect, unspecified: Secondary | ICD-10-CM | POA: Diagnosis not present

## 2023-03-01 DIAGNOSIS — C641 Malignant neoplasm of right kidney, except renal pelvis: Secondary | ICD-10-CM | POA: Diagnosis not present

## 2023-03-01 DIAGNOSIS — Z905 Acquired absence of kidney: Secondary | ICD-10-CM | POA: Diagnosis not present

## 2023-03-01 DIAGNOSIS — I700 Atherosclerosis of aorta: Secondary | ICD-10-CM | POA: Diagnosis not present

## 2023-03-01 DIAGNOSIS — D4102 Neoplasm of uncertain behavior of left kidney: Secondary | ICD-10-CM | POA: Diagnosis not present

## 2023-03-02 DIAGNOSIS — N186 End stage renal disease: Secondary | ICD-10-CM | POA: Diagnosis not present

## 2023-03-02 DIAGNOSIS — D689 Coagulation defect, unspecified: Secondary | ICD-10-CM | POA: Diagnosis not present

## 2023-03-02 DIAGNOSIS — R52 Pain, unspecified: Secondary | ICD-10-CM | POA: Diagnosis not present

## 2023-03-02 DIAGNOSIS — D509 Iron deficiency anemia, unspecified: Secondary | ICD-10-CM | POA: Diagnosis not present

## 2023-03-02 DIAGNOSIS — D631 Anemia in chronic kidney disease: Secondary | ICD-10-CM | POA: Diagnosis not present

## 2023-03-02 DIAGNOSIS — Z992 Dependence on renal dialysis: Secondary | ICD-10-CM | POA: Diagnosis not present

## 2023-03-02 DIAGNOSIS — N2581 Secondary hyperparathyroidism of renal origin: Secondary | ICD-10-CM | POA: Diagnosis not present

## 2023-03-02 DIAGNOSIS — E876 Hypokalemia: Secondary | ICD-10-CM | POA: Diagnosis not present

## 2023-03-05 DIAGNOSIS — D631 Anemia in chronic kidney disease: Secondary | ICD-10-CM | POA: Diagnosis not present

## 2023-03-05 DIAGNOSIS — R52 Pain, unspecified: Secondary | ICD-10-CM | POA: Diagnosis not present

## 2023-03-05 DIAGNOSIS — Z992 Dependence on renal dialysis: Secondary | ICD-10-CM | POA: Diagnosis not present

## 2023-03-05 DIAGNOSIS — D509 Iron deficiency anemia, unspecified: Secondary | ICD-10-CM | POA: Diagnosis not present

## 2023-03-05 DIAGNOSIS — N186 End stage renal disease: Secondary | ICD-10-CM | POA: Diagnosis not present

## 2023-03-05 DIAGNOSIS — N2581 Secondary hyperparathyroidism of renal origin: Secondary | ICD-10-CM | POA: Diagnosis not present

## 2023-03-05 DIAGNOSIS — D689 Coagulation defect, unspecified: Secondary | ICD-10-CM | POA: Diagnosis not present

## 2023-03-05 DIAGNOSIS — E876 Hypokalemia: Secondary | ICD-10-CM | POA: Diagnosis not present

## 2023-03-07 DIAGNOSIS — E876 Hypokalemia: Secondary | ICD-10-CM | POA: Diagnosis not present

## 2023-03-07 DIAGNOSIS — N2581 Secondary hyperparathyroidism of renal origin: Secondary | ICD-10-CM | POA: Diagnosis not present

## 2023-03-07 DIAGNOSIS — D509 Iron deficiency anemia, unspecified: Secondary | ICD-10-CM | POA: Diagnosis not present

## 2023-03-07 DIAGNOSIS — Z992 Dependence on renal dialysis: Secondary | ICD-10-CM | POA: Diagnosis not present

## 2023-03-07 DIAGNOSIS — R52 Pain, unspecified: Secondary | ICD-10-CM | POA: Diagnosis not present

## 2023-03-07 DIAGNOSIS — N186 End stage renal disease: Secondary | ICD-10-CM | POA: Diagnosis not present

## 2023-03-07 DIAGNOSIS — D631 Anemia in chronic kidney disease: Secondary | ICD-10-CM | POA: Diagnosis not present

## 2023-03-07 DIAGNOSIS — D689 Coagulation defect, unspecified: Secondary | ICD-10-CM | POA: Diagnosis not present

## 2023-03-09 DIAGNOSIS — N2581 Secondary hyperparathyroidism of renal origin: Secondary | ICD-10-CM | POA: Diagnosis not present

## 2023-03-09 DIAGNOSIS — N186 End stage renal disease: Secondary | ICD-10-CM | POA: Diagnosis not present

## 2023-03-09 DIAGNOSIS — D509 Iron deficiency anemia, unspecified: Secondary | ICD-10-CM | POA: Diagnosis not present

## 2023-03-09 DIAGNOSIS — E876 Hypokalemia: Secondary | ICD-10-CM | POA: Diagnosis not present

## 2023-03-09 DIAGNOSIS — D631 Anemia in chronic kidney disease: Secondary | ICD-10-CM | POA: Diagnosis not present

## 2023-03-09 DIAGNOSIS — D689 Coagulation defect, unspecified: Secondary | ICD-10-CM | POA: Diagnosis not present

## 2023-03-09 DIAGNOSIS — I129 Hypertensive chronic kidney disease with stage 1 through stage 4 chronic kidney disease, or unspecified chronic kidney disease: Secondary | ICD-10-CM | POA: Diagnosis not present

## 2023-03-09 DIAGNOSIS — Z992 Dependence on renal dialysis: Secondary | ICD-10-CM | POA: Diagnosis not present

## 2023-03-09 DIAGNOSIS — R52 Pain, unspecified: Secondary | ICD-10-CM | POA: Diagnosis not present

## 2023-03-12 ENCOUNTER — Encounter: Payer: Self-pay | Admitting: Internal Medicine

## 2023-03-12 DIAGNOSIS — Z992 Dependence on renal dialysis: Secondary | ICD-10-CM | POA: Diagnosis not present

## 2023-03-12 DIAGNOSIS — N186 End stage renal disease: Secondary | ICD-10-CM | POA: Diagnosis not present

## 2023-03-12 DIAGNOSIS — D689 Coagulation defect, unspecified: Secondary | ICD-10-CM | POA: Diagnosis not present

## 2023-03-12 DIAGNOSIS — D631 Anemia in chronic kidney disease: Secondary | ICD-10-CM | POA: Diagnosis not present

## 2023-03-12 DIAGNOSIS — E876 Hypokalemia: Secondary | ICD-10-CM | POA: Diagnosis not present

## 2023-03-12 DIAGNOSIS — N2581 Secondary hyperparathyroidism of renal origin: Secondary | ICD-10-CM | POA: Diagnosis not present

## 2023-03-12 DIAGNOSIS — D509 Iron deficiency anemia, unspecified: Secondary | ICD-10-CM | POA: Diagnosis not present

## 2023-03-14 DIAGNOSIS — D631 Anemia in chronic kidney disease: Secondary | ICD-10-CM | POA: Diagnosis not present

## 2023-03-14 DIAGNOSIS — Z992 Dependence on renal dialysis: Secondary | ICD-10-CM | POA: Diagnosis not present

## 2023-03-14 DIAGNOSIS — D509 Iron deficiency anemia, unspecified: Secondary | ICD-10-CM | POA: Diagnosis not present

## 2023-03-14 DIAGNOSIS — D689 Coagulation defect, unspecified: Secondary | ICD-10-CM | POA: Diagnosis not present

## 2023-03-14 DIAGNOSIS — N186 End stage renal disease: Secondary | ICD-10-CM | POA: Diagnosis not present

## 2023-03-14 DIAGNOSIS — E876 Hypokalemia: Secondary | ICD-10-CM | POA: Diagnosis not present

## 2023-03-14 DIAGNOSIS — N2581 Secondary hyperparathyroidism of renal origin: Secondary | ICD-10-CM | POA: Diagnosis not present

## 2023-03-15 DIAGNOSIS — C641 Malignant neoplasm of right kidney, except renal pelvis: Secondary | ICD-10-CM | POA: Diagnosis not present

## 2023-03-16 DIAGNOSIS — E876 Hypokalemia: Secondary | ICD-10-CM | POA: Diagnosis not present

## 2023-03-16 DIAGNOSIS — N186 End stage renal disease: Secondary | ICD-10-CM | POA: Diagnosis not present

## 2023-03-16 DIAGNOSIS — Z992 Dependence on renal dialysis: Secondary | ICD-10-CM | POA: Diagnosis not present

## 2023-03-16 DIAGNOSIS — D631 Anemia in chronic kidney disease: Secondary | ICD-10-CM | POA: Diagnosis not present

## 2023-03-16 DIAGNOSIS — D509 Iron deficiency anemia, unspecified: Secondary | ICD-10-CM | POA: Diagnosis not present

## 2023-03-16 DIAGNOSIS — D689 Coagulation defect, unspecified: Secondary | ICD-10-CM | POA: Diagnosis not present

## 2023-03-16 DIAGNOSIS — N2581 Secondary hyperparathyroidism of renal origin: Secondary | ICD-10-CM | POA: Diagnosis not present

## 2023-03-19 DIAGNOSIS — N2581 Secondary hyperparathyroidism of renal origin: Secondary | ICD-10-CM | POA: Diagnosis not present

## 2023-03-19 DIAGNOSIS — D631 Anemia in chronic kidney disease: Secondary | ICD-10-CM | POA: Diagnosis not present

## 2023-03-19 DIAGNOSIS — N186 End stage renal disease: Secondary | ICD-10-CM | POA: Diagnosis not present

## 2023-03-19 DIAGNOSIS — Z992 Dependence on renal dialysis: Secondary | ICD-10-CM | POA: Diagnosis not present

## 2023-03-19 DIAGNOSIS — E876 Hypokalemia: Secondary | ICD-10-CM | POA: Diagnosis not present

## 2023-03-19 DIAGNOSIS — D689 Coagulation defect, unspecified: Secondary | ICD-10-CM | POA: Diagnosis not present

## 2023-03-19 DIAGNOSIS — D509 Iron deficiency anemia, unspecified: Secondary | ICD-10-CM | POA: Diagnosis not present

## 2023-03-21 DIAGNOSIS — D631 Anemia in chronic kidney disease: Secondary | ICD-10-CM | POA: Diagnosis not present

## 2023-03-21 DIAGNOSIS — Z992 Dependence on renal dialysis: Secondary | ICD-10-CM | POA: Diagnosis not present

## 2023-03-21 DIAGNOSIS — D689 Coagulation defect, unspecified: Secondary | ICD-10-CM | POA: Diagnosis not present

## 2023-03-21 DIAGNOSIS — N2581 Secondary hyperparathyroidism of renal origin: Secondary | ICD-10-CM | POA: Diagnosis not present

## 2023-03-21 DIAGNOSIS — E876 Hypokalemia: Secondary | ICD-10-CM | POA: Diagnosis not present

## 2023-03-21 DIAGNOSIS — N186 End stage renal disease: Secondary | ICD-10-CM | POA: Diagnosis not present

## 2023-03-21 DIAGNOSIS — D509 Iron deficiency anemia, unspecified: Secondary | ICD-10-CM | POA: Diagnosis not present

## 2023-03-23 DIAGNOSIS — N186 End stage renal disease: Secondary | ICD-10-CM | POA: Diagnosis not present

## 2023-03-23 DIAGNOSIS — Z992 Dependence on renal dialysis: Secondary | ICD-10-CM | POA: Diagnosis not present

## 2023-03-23 DIAGNOSIS — D509 Iron deficiency anemia, unspecified: Secondary | ICD-10-CM | POA: Diagnosis not present

## 2023-03-23 DIAGNOSIS — D631 Anemia in chronic kidney disease: Secondary | ICD-10-CM | POA: Diagnosis not present

## 2023-03-23 DIAGNOSIS — E876 Hypokalemia: Secondary | ICD-10-CM | POA: Diagnosis not present

## 2023-03-23 DIAGNOSIS — D689 Coagulation defect, unspecified: Secondary | ICD-10-CM | POA: Diagnosis not present

## 2023-03-23 DIAGNOSIS — N2581 Secondary hyperparathyroidism of renal origin: Secondary | ICD-10-CM | POA: Diagnosis not present

## 2023-03-23 NOTE — Addendum Note (Signed)
 Addended by: Elease Etienne A on: 03/23/2023 01:18 PM   Modules accepted: Orders

## 2023-03-23 NOTE — Progress Notes (Signed)
 Remote pacemaker transmission.

## 2023-03-26 DIAGNOSIS — D689 Coagulation defect, unspecified: Secondary | ICD-10-CM | POA: Diagnosis not present

## 2023-03-26 DIAGNOSIS — D631 Anemia in chronic kidney disease: Secondary | ICD-10-CM | POA: Diagnosis not present

## 2023-03-26 DIAGNOSIS — N2581 Secondary hyperparathyroidism of renal origin: Secondary | ICD-10-CM | POA: Diagnosis not present

## 2023-03-26 DIAGNOSIS — D509 Iron deficiency anemia, unspecified: Secondary | ICD-10-CM | POA: Diagnosis not present

## 2023-03-26 DIAGNOSIS — N186 End stage renal disease: Secondary | ICD-10-CM | POA: Diagnosis not present

## 2023-03-26 DIAGNOSIS — E876 Hypokalemia: Secondary | ICD-10-CM | POA: Diagnosis not present

## 2023-03-26 DIAGNOSIS — Z992 Dependence on renal dialysis: Secondary | ICD-10-CM | POA: Diagnosis not present

## 2023-03-26 NOTE — Progress Notes (Unsigned)
 [name] MRN: [id number] DOB: 29-May-1940 Sex: male  REASON FOR CONSULT: 30-month follow-up, concern right hand steal  HPI: Paul Price is a 83 y.o. male, with hx ESRD, atrial fibrillation, CAD, DVT, HTN, that presents for 72-month follow-up for further evaluation of right hand steal.  Previously seen with right upper arm pain after his fistula was infiltrated.  Patient has a right basilic vein fistula created on 03/30/2020.  We did not feel this was related to steal syndrome and likely infiltration of the fistula with possible injury to the nerve.  I did put him on 300 mg gabapentin at night.  He states his pain completely went away but he had mental status changes and had to stop the medicine.  Today states his right arm fistula is working well.  Still has pain around the fistula but all the swelling from infiltration has improved.    On last evaluation we recommended restarting gabapentin at 100 mg nightly at a lower dose as this significantly helped his symptoms.  Past Medical History:  Diagnosis Date   Arthritis    "shoulders" (09/07/2016)   Atrial fibrillation (HCC)    BPH (benign prostatic hyperplasia)    Chronic kidney disease (CKD), stage III (moderate) (HCC)    Coronary artery disease    DDD (degenerative disc disease), cervical    DVT (deep venous thrombosis) (HCC) 12/2013   LLE   GERD (gastroesophageal reflux disease)    Gout    High cholesterol    History of blood transfusion 09/06/2016   2 u PRBC   History of scarlet fever 1940s   History of stress test 06/2011   No significant ischemia, this is a low risk scan. Clinical correlation recommended Abnormal myocardial perfusion study.   Hx of echocardiogram 05/2009   EF 40-45%, he did have mild annular calcification with mild-to-moderate MR and mild TR as well as aortic valve sclerosis. Estimated RV systolic pressure was 21 mm.   Hypertension    Iron deficiency anemia    Kidney failure    Myositis  12/2013   paraspinal lumbar area   Neck pain    "not chronic" (09/07/2016)   Obesity    OSA on CPAP    Presence of permanent cardiac pacemaker    RBBB    Renal insufficiency    Right renal mass    Spinal stenosis of lumbar region     Past Surgical History:  Procedure Laterality Date   A/V FISTULAGRAM N/A 01/17/2023   Procedure: A/V Fistulagram;  Surgeon: Ethelene Hal, MD;  Location: [facility] INVASIVE CV LAB;  Service: Cardiovascular;  Laterality: N/A;   APPENDECTOMY     AV FISTULA PLACEMENT Right 02/09/2020   Procedure: ARTERIOVENOUS (AV) FISTULA  CREATION RIGHT UPPER EXTREMITY;  Surgeon: Cephus Shelling, MD;  Location: [facility] OR;  Service: Vascular;  Laterality: Right;   BASCILIC VEIN TRANSPOSITION Right 03/30/2020   Procedure: RIGHT SECOND STAGE BASCILIC VEIN TRANSPOSITION;  Surgeon: Maeola Harman, MD;  Location: [facility] OR;  Service: Vascular;  Laterality: Right;   CARDIAC CATHETERIZATION  2009   he was found to have a atretic LIMA graft to his LAD and had a patent vein graft supplying his diagonal vessel. At that time, he had 70% narrowing in his LAD beyond a diagonal vessel which was initially treated medically.   COLONOSCOPY WITH PROPOFOL Left 09/09/2016   Procedure: COLONOSCOPY WITH PROPOFOL;  Surgeon: Willis Modena, MD;  Location: [facility] ENDOSCOPY;  Service: Endoscopy;  Laterality: Left;   CORONARY ANGIOPLASTY WITH STENT PLACEMENT  April 2012   LAD DES   CORONARY ARTERY BYPASS GRAFT  1992   with LIMA to the LAD and diagonal.   CYSTOSCOPY N/A 03/31/2019   Procedure: CYSTOSCOPY FLEXIBLE;  Surgeon: Crista Elliot, MD;  Location: [facility] ORS;  Service: Urology;  Laterality: N/A;   CYSTOSCOPY WITH URETHRAL DILATATION N/A 05/21/2019   Procedure: CYSTOSCOPY WITH URETHRAL BALLOON DILATATION;  Surgeon: Crista Elliot, MD;  Location: [facility] ORS;  Service: Urology;  Laterality: N/A;   ESOPHAGOGASTRODUODENOSCOPY N/A 11/16/2019   Procedure: ESOPHAGOGASTRODUODENOSCOPY (EGD);  Surgeon: Charlott Rakes, MD;  Location: [facility] ENDOSCOPY;  Service: Endoscopy;  Laterality: N/A;   ESOPHAGOGASTRODUODENOSCOPY (EGD) WITH PROPOFOL Left 09/08/2016   Procedure: ESOPHAGOGASTRODUODENOSCOPY (EGD) WITH PROPOFOL;  Surgeon: Kerin Salen, MD;  Location: [facility] ENDOSCOPY;  Service: Gastroenterology;  Laterality: Left;   EXCHANGE OF A DIALYSIS CATHETER Right 02/09/2020   Procedure: EXCHANGE OF A DIALYSIS CATHETER;  Surgeon: Cephus Shelling, MD;  Location: [facility] OR;  Service: Vascular;  Laterality: Right;   GIVENS CAPSULE STUDY N/A 11/16/2019   Procedure: GIVENS CAPSULE STUDY;  Surgeon: Charlott Rakes, MD;  Location: [facility] ENDOSCOPY;  Service: Endoscopy;  Laterality: N/A;   INGUINAL HERNIA REPAIR     "don't remeimber which side"   IR FLUORO GUIDE CV LINE RIGHT  02/05/2020   IR US GUIDE VASC ACCESS RIGHT  02/05/2020   LAPAROSCOPIC NEPHRECTOMY Right 03/31/2019   Procedure: RIGHT  HAND ASSIST LAPAROSCOPIC NEPHRECTOMY;  Surgeon: Crista Elliot, MD;  Location: [facility] ORS;  Service: Urology;  Laterality: Right;   PACEMAKER IMPLANT N/A 12/25/2016   Procedure: PACEMAKER IMPLANT;  Surgeon: Duke Salvia, MD;  Location: [facility] INVASIVE CV LAB;  Service: Cardiovascular;  Laterality: N/A;   PERIPHERAL VASCULAR BALLOON ANGIOPLASTY Right 01/17/2023   Procedure: PERIPHERAL VASCULAR BALLOON ANGIOPLASTY;  Surgeon: Ethelene Hal, MD;  Location: [facility] INVASIVE CV LAB;  Service: Cardiovascular;  Laterality: Right;  basilic inflow   TEE WITHOUT CARDIOVERSION N/A 02/06/2020   Procedure: TRANSESOPHAGEAL ECHOCARDIOGRAM (TEE);  Surgeon: Chilton Si, MD;  Location: [facility] ENDOSCOPY;  Service: Cardiovascular;  Laterality: N/A;   TONSILLECTOMY      Family History  Problem Relation Age of Onset   Heart disease Father     SOCIAL HISTORY: Social History   Socioeconomic History   Marital status: Married    Spouse name: Not on file   Number of children: 3   Years of education: Not on file   Highest education level: Not on file  Occupational  History   Not on file  Tobacco Use   Smoking status: Former    Current packs/day: 1.00    Average packs/day: 1 pack/day for 30.0 years (30.0 ttl pk-yrs)    Types: Cigarettes   Smokeless tobacco: Never   Tobacco comments:    quit ~ 1985  Vaping Use   Vaping status: Never Used  Substance and Sexual Activity   Alcohol use: Not Currently    Alcohol/week: 0.0 standard drinks of alcohol    Comment: rare beer   Drug use: No   Sexual activity: Not Currently  Other Topics Concern   Not on file  Social History Narrative   Not on file   Social Drivers of Health   Financial Resource Strain: Not on file  Food Insecurity: Not on file  Transportation Needs: Not on file  Physical Activity: Not on file  Stress: Not on file  Social Connections: Not on file  Intimate Partner Violence: Not on file    Allergies  Allergen Reactions   Gabapentin Other (See Comments)    Dizziness Loss of mental capacity   Simvastatin Other (See Comments)    Abdominal cramps   Nitrostat [Nitroglycerin] Other (See Comments)    Causes blood pressure to "bottom out"    Current Outpatient Medications  Medication Sig Dispense Refill   acetaminophen (TYLENOL) 500 MG tablet Take 1,500-2,000 mg by mouth daily as needed for mild pain (pain score 1-3), moderate pain (pain score 4-6) or headache.     allopurinol (ZYLOPRIM) 300 MG tablet Take 300 mg by mouth daily.     aspirin EC 81 MG tablet Take 81 mg by mouth daily. Swallow whole.     atorvastatin (LIPITOR) 20 MG tablet TAKE 1 TABLET BY MOUTH DAILY 90 tablet 2   B Complex-C (B-COMPLEX WITH VITAMIN C) tablet Take 1 tablet by mouth daily.     loperamide (IMODIUM A-D) 2 MG tablet Take 2 mg by mouth as needed for diarrhea or loose stools.     midodrine (PROAMATINE) 2.5 MG tablet TAKE 1 TABLET BY MOUTH IMMEDIATELY AFTER DIALYSIS THEN ANOTHER TABLET 4 HOURS LATER 90 tablet 0   omeprazole (PRILOSEC OTC) 20 MG tablet Take 20 mg by mouth daily.     pantoprazole  (PROTONIX) 40 MG tablet Take 40 mg by mouth daily.     No current facility-administered medications for this visit.    REVIEW OF SYSTEMS:  [X]  denotes positive finding, [ ]  denotes negative finding Cardiac  Comments:  Chest pain or chest pressure:    Shortness of breath upon exertion:    Short of breath when lying flat:    Irregular heart rhythm:        Vascular    Pain in calf, thigh, or hip brought on by ambulation:    Pain in feet at night that wakes you up from your sleep:     Blood clot in your veins:    Leg swelling:         Pulmonary    Oxygen at home:    Productive cough:     Wheezing:         Neurologic    Sudden weakness in arms or legs:     Sudden numbness in arms or legs:     Sudden onset of difficulty speaking or slurred speech:    Temporary loss of vision in one eye:     Problems with dizziness:         Gastrointestinal    Blood in stool:     Vomited blood:         Genitourinary    Burning when urinating:     Blood in urine:        Psychiatric    Major depression:         Hematologic    Bleeding problems:    Problems with blood clotting too easily:        Skin    Rashes or ulcers:        Constitutional    Fever or chills:      PHYSICAL EXAM: There were no vitals filed for this visit.   GENERAL: The patient is a well-nourished male, in no acute distress. The vital signs are documented above. CARDIAC: There is a regular rate and rhythm.  VASCULAR:  Right brachiobasilic fistula with great thrill Right radial pulse palpable No distal tissue loss Right hand is motor and sensory intact  DATA:   Steal study showed right arm fistula compression his second digit pressure went from 130 mm Hg to 178 mm Hg  Assessment/Plan:  83 y.o. male, with hx ESRD, atrial fibrillation, CAD, DVT, HTN, that presents for 39-month follow-up for ongoing evaluation of possible steal syndrome in his right upper extremity.  Patient has a right basilic vein fistula  created on 03/30/2020.  This was working well until it got infiltrated several months ago when he started having right upper arm pain.  We felt this was likely related to the fistula being infiltrated and/or sticking a nerve and not steal syndrome.  Again today he has a palpable radial pulse at the wrist and has no symptoms in the hand.  All his pain is in the upper arm around the fistula where it was infiltrated.  He saw significant improvement on gabapentin but unfortunately he had some changes in his mental status with 300 mg nightly.  He recommended restarting this at 100 mg nightly.    Cephus Shelling, MD [facility] Office: [phone]

## 2023-03-27 ENCOUNTER — Ambulatory Visit: Payer: Medicare Other | Admitting: Vascular Surgery

## 2023-03-27 ENCOUNTER — Encounter: Payer: Self-pay | Admitting: Vascular Surgery

## 2023-03-27 VITALS — BP 132/77 | HR 69 | Temp 97.6°F | Resp 18 | Ht 66.0 in | Wt 159.4 lb

## 2023-03-27 DIAGNOSIS — N186 End stage renal disease: Secondary | ICD-10-CM

## 2023-03-28 DIAGNOSIS — E876 Hypokalemia: Secondary | ICD-10-CM | POA: Diagnosis not present

## 2023-03-28 DIAGNOSIS — N2581 Secondary hyperparathyroidism of renal origin: Secondary | ICD-10-CM | POA: Diagnosis not present

## 2023-03-28 DIAGNOSIS — Z992 Dependence on renal dialysis: Secondary | ICD-10-CM | POA: Diagnosis not present

## 2023-03-28 DIAGNOSIS — N186 End stage renal disease: Secondary | ICD-10-CM | POA: Diagnosis not present

## 2023-03-28 DIAGNOSIS — D631 Anemia in chronic kidney disease: Secondary | ICD-10-CM | POA: Diagnosis not present

## 2023-03-28 DIAGNOSIS — D689 Coagulation defect, unspecified: Secondary | ICD-10-CM | POA: Diagnosis not present

## 2023-03-28 DIAGNOSIS — D509 Iron deficiency anemia, unspecified: Secondary | ICD-10-CM | POA: Diagnosis not present

## 2023-03-30 DIAGNOSIS — E876 Hypokalemia: Secondary | ICD-10-CM | POA: Diagnosis not present

## 2023-03-30 DIAGNOSIS — D509 Iron deficiency anemia, unspecified: Secondary | ICD-10-CM | POA: Diagnosis not present

## 2023-03-30 DIAGNOSIS — N186 End stage renal disease: Secondary | ICD-10-CM | POA: Diagnosis not present

## 2023-03-30 DIAGNOSIS — Z992 Dependence on renal dialysis: Secondary | ICD-10-CM | POA: Diagnosis not present

## 2023-03-30 DIAGNOSIS — D631 Anemia in chronic kidney disease: Secondary | ICD-10-CM | POA: Diagnosis not present

## 2023-03-30 DIAGNOSIS — D689 Coagulation defect, unspecified: Secondary | ICD-10-CM | POA: Diagnosis not present

## 2023-03-30 DIAGNOSIS — N2581 Secondary hyperparathyroidism of renal origin: Secondary | ICD-10-CM | POA: Diagnosis not present

## 2023-04-02 DIAGNOSIS — D689 Coagulation defect, unspecified: Secondary | ICD-10-CM | POA: Diagnosis not present

## 2023-04-02 DIAGNOSIS — Z992 Dependence on renal dialysis: Secondary | ICD-10-CM | POA: Diagnosis not present

## 2023-04-02 DIAGNOSIS — D509 Iron deficiency anemia, unspecified: Secondary | ICD-10-CM | POA: Diagnosis not present

## 2023-04-02 DIAGNOSIS — N2581 Secondary hyperparathyroidism of renal origin: Secondary | ICD-10-CM | POA: Diagnosis not present

## 2023-04-02 DIAGNOSIS — N186 End stage renal disease: Secondary | ICD-10-CM | POA: Diagnosis not present

## 2023-04-02 DIAGNOSIS — E876 Hypokalemia: Secondary | ICD-10-CM | POA: Diagnosis not present

## 2023-04-02 DIAGNOSIS — D631 Anemia in chronic kidney disease: Secondary | ICD-10-CM | POA: Diagnosis not present

## 2023-04-04 DIAGNOSIS — D631 Anemia in chronic kidney disease: Secondary | ICD-10-CM | POA: Diagnosis not present

## 2023-04-04 DIAGNOSIS — N2581 Secondary hyperparathyroidism of renal origin: Secondary | ICD-10-CM | POA: Diagnosis not present

## 2023-04-04 DIAGNOSIS — E876 Hypokalemia: Secondary | ICD-10-CM | POA: Diagnosis not present

## 2023-04-04 DIAGNOSIS — D509 Iron deficiency anemia, unspecified: Secondary | ICD-10-CM | POA: Diagnosis not present

## 2023-04-04 DIAGNOSIS — N186 End stage renal disease: Secondary | ICD-10-CM | POA: Diagnosis not present

## 2023-04-04 DIAGNOSIS — D689 Coagulation defect, unspecified: Secondary | ICD-10-CM | POA: Diagnosis not present

## 2023-04-04 DIAGNOSIS — Z992 Dependence on renal dialysis: Secondary | ICD-10-CM | POA: Diagnosis not present

## 2023-04-06 DIAGNOSIS — Z992 Dependence on renal dialysis: Secondary | ICD-10-CM | POA: Diagnosis not present

## 2023-04-06 DIAGNOSIS — E876 Hypokalemia: Secondary | ICD-10-CM | POA: Diagnosis not present

## 2023-04-06 DIAGNOSIS — N186 End stage renal disease: Secondary | ICD-10-CM | POA: Diagnosis not present

## 2023-04-06 DIAGNOSIS — D509 Iron deficiency anemia, unspecified: Secondary | ICD-10-CM | POA: Diagnosis not present

## 2023-04-06 DIAGNOSIS — D631 Anemia in chronic kidney disease: Secondary | ICD-10-CM | POA: Diagnosis not present

## 2023-04-06 DIAGNOSIS — D689 Coagulation defect, unspecified: Secondary | ICD-10-CM | POA: Diagnosis not present

## 2023-04-06 DIAGNOSIS — N2581 Secondary hyperparathyroidism of renal origin: Secondary | ICD-10-CM | POA: Diagnosis not present

## 2023-04-09 DIAGNOSIS — D689 Coagulation defect, unspecified: Secondary | ICD-10-CM | POA: Diagnosis not present

## 2023-04-09 DIAGNOSIS — N2581 Secondary hyperparathyroidism of renal origin: Secondary | ICD-10-CM | POA: Diagnosis not present

## 2023-04-09 DIAGNOSIS — D631 Anemia in chronic kidney disease: Secondary | ICD-10-CM | POA: Diagnosis not present

## 2023-04-09 DIAGNOSIS — Z992 Dependence on renal dialysis: Secondary | ICD-10-CM | POA: Diagnosis not present

## 2023-04-09 DIAGNOSIS — N186 End stage renal disease: Secondary | ICD-10-CM | POA: Diagnosis not present

## 2023-04-09 DIAGNOSIS — E876 Hypokalemia: Secondary | ICD-10-CM | POA: Diagnosis not present

## 2023-04-09 DIAGNOSIS — D509 Iron deficiency anemia, unspecified: Secondary | ICD-10-CM | POA: Diagnosis not present

## 2023-04-09 DIAGNOSIS — I129 Hypertensive chronic kidney disease with stage 1 through stage 4 chronic kidney disease, or unspecified chronic kidney disease: Secondary | ICD-10-CM | POA: Diagnosis not present

## 2023-04-11 DIAGNOSIS — E876 Hypokalemia: Secondary | ICD-10-CM | POA: Diagnosis not present

## 2023-04-11 DIAGNOSIS — D509 Iron deficiency anemia, unspecified: Secondary | ICD-10-CM | POA: Diagnosis not present

## 2023-04-11 DIAGNOSIS — N186 End stage renal disease: Secondary | ICD-10-CM | POA: Diagnosis not present

## 2023-04-11 DIAGNOSIS — N2581 Secondary hyperparathyroidism of renal origin: Secondary | ICD-10-CM | POA: Diagnosis not present

## 2023-04-11 DIAGNOSIS — D631 Anemia in chronic kidney disease: Secondary | ICD-10-CM | POA: Diagnosis not present

## 2023-04-11 DIAGNOSIS — Z992 Dependence on renal dialysis: Secondary | ICD-10-CM | POA: Diagnosis not present

## 2023-04-11 DIAGNOSIS — R197 Diarrhea, unspecified: Secondary | ICD-10-CM | POA: Diagnosis not present

## 2023-04-11 DIAGNOSIS — D689 Coagulation defect, unspecified: Secondary | ICD-10-CM | POA: Diagnosis not present

## 2023-04-13 DIAGNOSIS — N186 End stage renal disease: Secondary | ICD-10-CM | POA: Diagnosis not present

## 2023-04-13 DIAGNOSIS — D631 Anemia in chronic kidney disease: Secondary | ICD-10-CM | POA: Diagnosis not present

## 2023-04-13 DIAGNOSIS — N2581 Secondary hyperparathyroidism of renal origin: Secondary | ICD-10-CM | POA: Diagnosis not present

## 2023-04-13 DIAGNOSIS — E876 Hypokalemia: Secondary | ICD-10-CM | POA: Diagnosis not present

## 2023-04-13 DIAGNOSIS — D509 Iron deficiency anemia, unspecified: Secondary | ICD-10-CM | POA: Diagnosis not present

## 2023-04-13 DIAGNOSIS — R197 Diarrhea, unspecified: Secondary | ICD-10-CM | POA: Diagnosis not present

## 2023-04-13 DIAGNOSIS — Z992 Dependence on renal dialysis: Secondary | ICD-10-CM | POA: Diagnosis not present

## 2023-04-13 DIAGNOSIS — D689 Coagulation defect, unspecified: Secondary | ICD-10-CM | POA: Diagnosis not present

## 2023-04-14 IMAGING — DX DG CHEST 2V
2 series · 2 of 2 positions shown · non-contrast
Comparison: February 09, 2020

CLINICAL DATA: Renal cell carcinoma

EXAM:
CHEST - 2 VIEW

[chest pa]
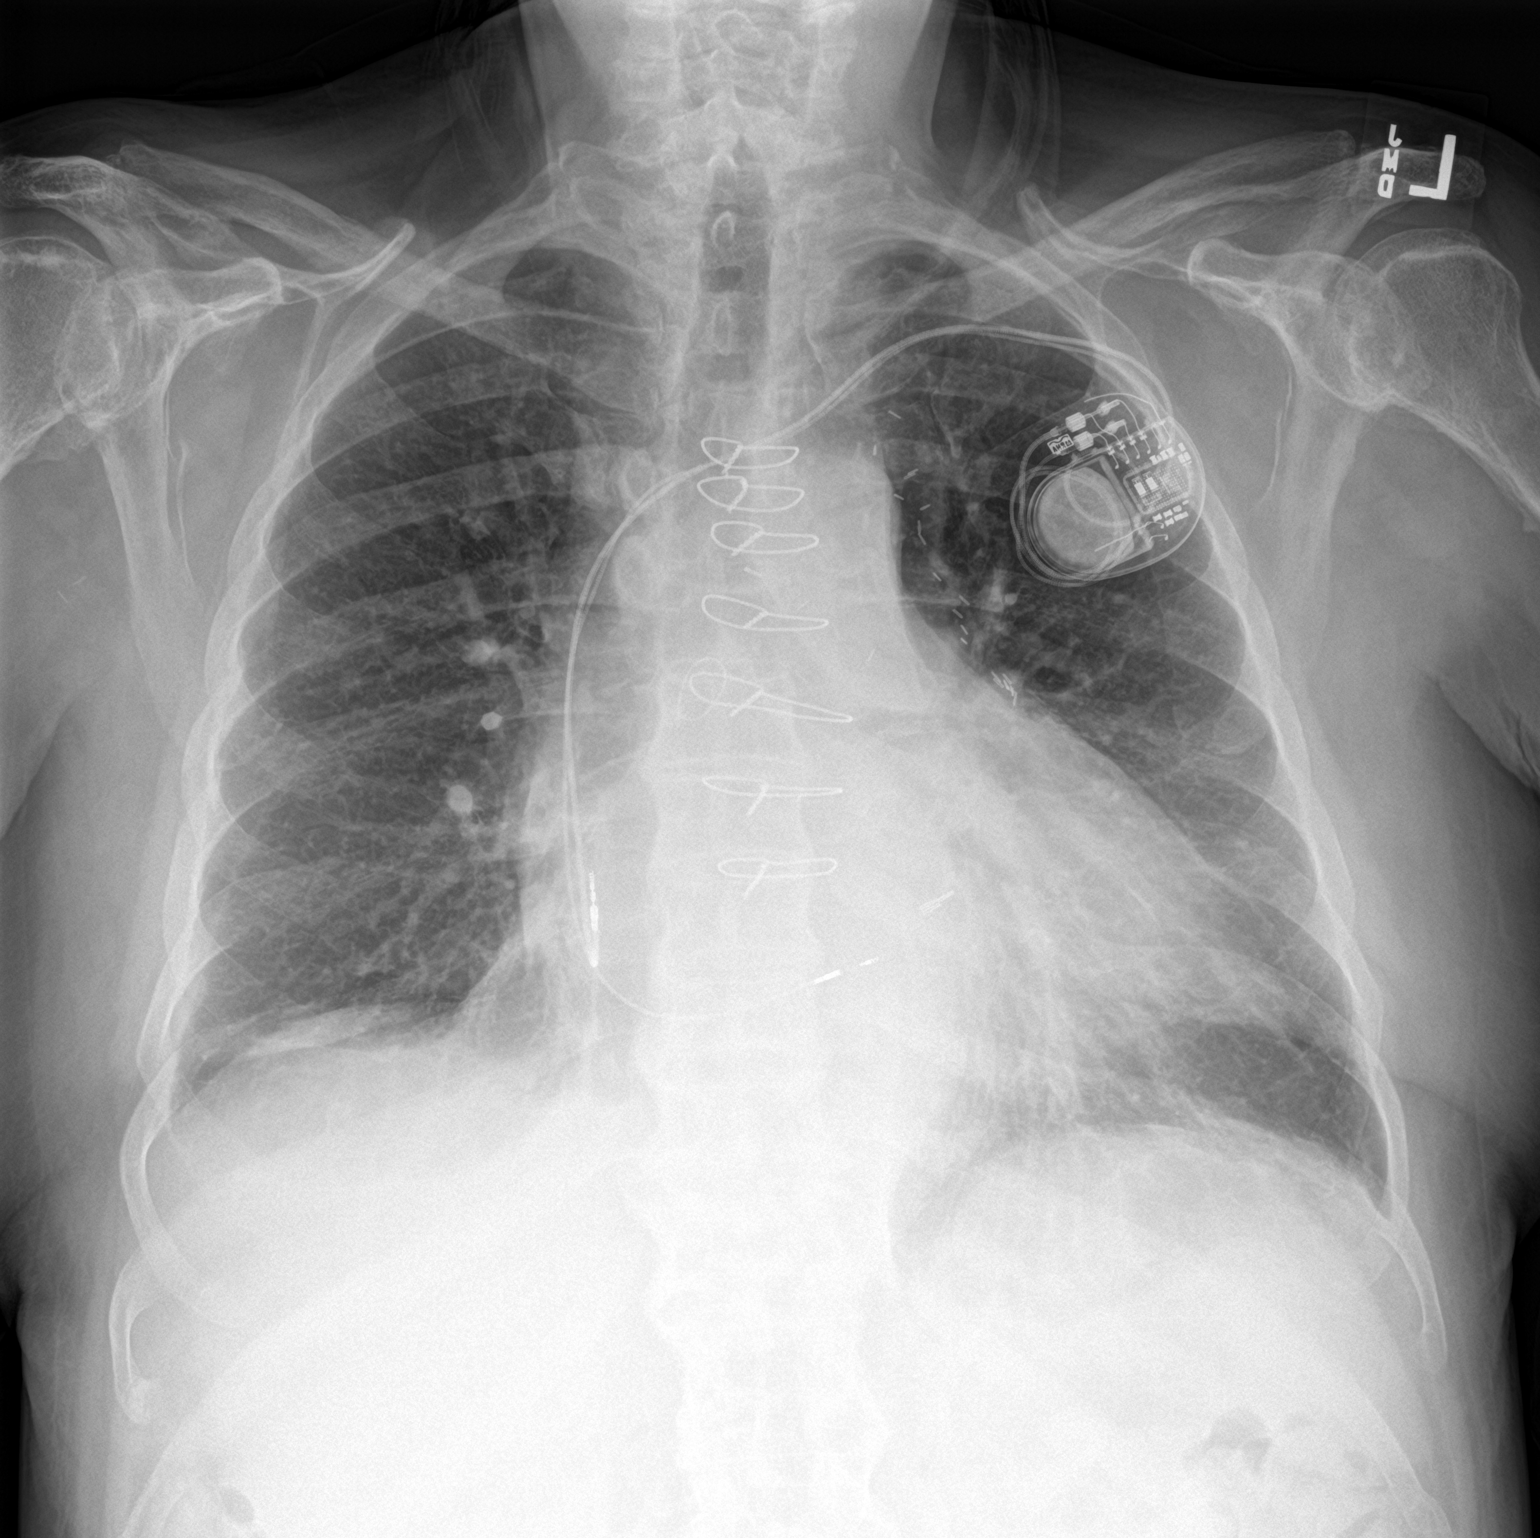

[chest lat]
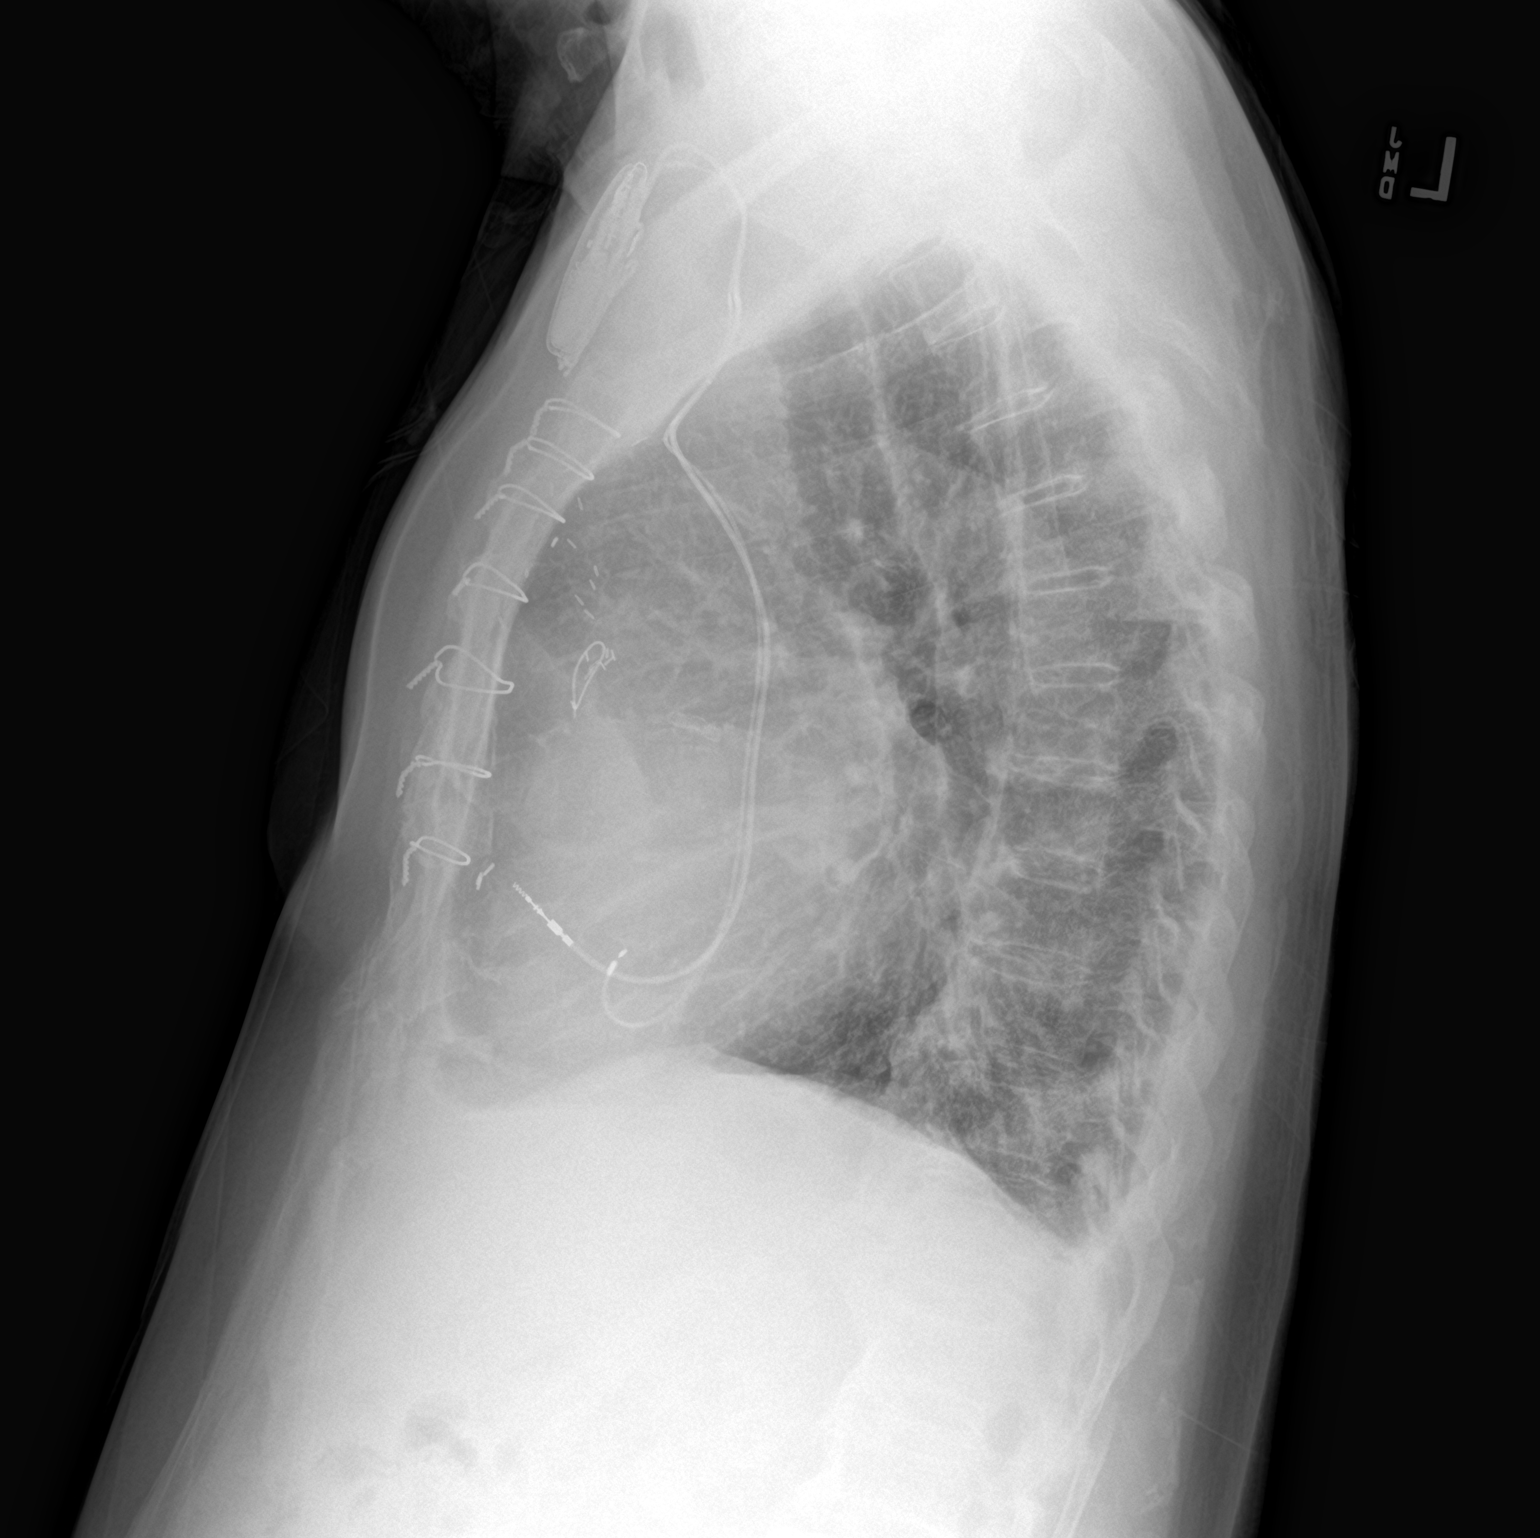

[2 of 2 positions shown; findings below may reference images not displayed]

FINDINGS: There is mild scarring in the right base. No edema or airspace
opacity. There is cardiomegaly with pulmonary vascularity normal.
Pacemaker leads are attached to the right atrium and right
ventricle. Patient is status post coronary artery bypass grafting.
No adenopathy. There is degenerative change in each shoulder. There
are no blastic or lytic bone lesions.
IMPRESSION: No evident neoplastic focus. Right base scarring. No edema or
airspace opacity. Cardiac enlargement with pacemaker leads attached
to right atrium and right ventricle. Status post coronary artery
bypass grafting.

## 2023-04-16 DIAGNOSIS — Z992 Dependence on renal dialysis: Secondary | ICD-10-CM | POA: Diagnosis not present

## 2023-04-16 DIAGNOSIS — N186 End stage renal disease: Secondary | ICD-10-CM | POA: Diagnosis not present

## 2023-04-16 DIAGNOSIS — E876 Hypokalemia: Secondary | ICD-10-CM | POA: Diagnosis not present

## 2023-04-16 DIAGNOSIS — D509 Iron deficiency anemia, unspecified: Secondary | ICD-10-CM | POA: Diagnosis not present

## 2023-04-16 DIAGNOSIS — N2581 Secondary hyperparathyroidism of renal origin: Secondary | ICD-10-CM | POA: Diagnosis not present

## 2023-04-16 DIAGNOSIS — D689 Coagulation defect, unspecified: Secondary | ICD-10-CM | POA: Diagnosis not present

## 2023-04-16 DIAGNOSIS — D631 Anemia in chronic kidney disease: Secondary | ICD-10-CM | POA: Diagnosis not present

## 2023-04-16 DIAGNOSIS — R197 Diarrhea, unspecified: Secondary | ICD-10-CM | POA: Diagnosis not present

## 2023-04-18 DIAGNOSIS — E876 Hypokalemia: Secondary | ICD-10-CM | POA: Diagnosis not present

## 2023-04-18 DIAGNOSIS — N186 End stage renal disease: Secondary | ICD-10-CM | POA: Diagnosis not present

## 2023-04-18 DIAGNOSIS — D631 Anemia in chronic kidney disease: Secondary | ICD-10-CM | POA: Diagnosis not present

## 2023-04-18 DIAGNOSIS — R197 Diarrhea, unspecified: Secondary | ICD-10-CM | POA: Diagnosis not present

## 2023-04-18 DIAGNOSIS — D509 Iron deficiency anemia, unspecified: Secondary | ICD-10-CM | POA: Diagnosis not present

## 2023-04-18 DIAGNOSIS — N2581 Secondary hyperparathyroidism of renal origin: Secondary | ICD-10-CM | POA: Diagnosis not present

## 2023-04-18 DIAGNOSIS — Z992 Dependence on renal dialysis: Secondary | ICD-10-CM | POA: Diagnosis not present

## 2023-04-18 DIAGNOSIS — D689 Coagulation defect, unspecified: Secondary | ICD-10-CM | POA: Diagnosis not present

## 2023-04-20 DIAGNOSIS — N2581 Secondary hyperparathyroidism of renal origin: Secondary | ICD-10-CM | POA: Diagnosis not present

## 2023-04-20 DIAGNOSIS — D509 Iron deficiency anemia, unspecified: Secondary | ICD-10-CM | POA: Diagnosis not present

## 2023-04-20 DIAGNOSIS — N186 End stage renal disease: Secondary | ICD-10-CM | POA: Diagnosis not present

## 2023-04-20 DIAGNOSIS — R197 Diarrhea, unspecified: Secondary | ICD-10-CM | POA: Diagnosis not present

## 2023-04-20 DIAGNOSIS — Z992 Dependence on renal dialysis: Secondary | ICD-10-CM | POA: Diagnosis not present

## 2023-04-20 DIAGNOSIS — E876 Hypokalemia: Secondary | ICD-10-CM | POA: Diagnosis not present

## 2023-04-20 DIAGNOSIS — D689 Coagulation defect, unspecified: Secondary | ICD-10-CM | POA: Diagnosis not present

## 2023-04-20 DIAGNOSIS — D631 Anemia in chronic kidney disease: Secondary | ICD-10-CM | POA: Diagnosis not present

## 2023-04-23 DIAGNOSIS — R197 Diarrhea, unspecified: Secondary | ICD-10-CM | POA: Diagnosis not present

## 2023-04-23 DIAGNOSIS — D509 Iron deficiency anemia, unspecified: Secondary | ICD-10-CM | POA: Diagnosis not present

## 2023-04-23 DIAGNOSIS — N186 End stage renal disease: Secondary | ICD-10-CM | POA: Diagnosis not present

## 2023-04-23 DIAGNOSIS — D631 Anemia in chronic kidney disease: Secondary | ICD-10-CM | POA: Diagnosis not present

## 2023-04-23 DIAGNOSIS — N2581 Secondary hyperparathyroidism of renal origin: Secondary | ICD-10-CM | POA: Diagnosis not present

## 2023-04-23 DIAGNOSIS — Z992 Dependence on renal dialysis: Secondary | ICD-10-CM | POA: Diagnosis not present

## 2023-04-23 DIAGNOSIS — D689 Coagulation defect, unspecified: Secondary | ICD-10-CM | POA: Diagnosis not present

## 2023-04-23 DIAGNOSIS — E876 Hypokalemia: Secondary | ICD-10-CM | POA: Diagnosis not present

## 2023-04-25 DIAGNOSIS — D509 Iron deficiency anemia, unspecified: Secondary | ICD-10-CM | POA: Diagnosis not present

## 2023-04-25 DIAGNOSIS — N2581 Secondary hyperparathyroidism of renal origin: Secondary | ICD-10-CM | POA: Diagnosis not present

## 2023-04-25 DIAGNOSIS — R197 Diarrhea, unspecified: Secondary | ICD-10-CM | POA: Diagnosis not present

## 2023-04-25 DIAGNOSIS — E876 Hypokalemia: Secondary | ICD-10-CM | POA: Diagnosis not present

## 2023-04-25 DIAGNOSIS — D689 Coagulation defect, unspecified: Secondary | ICD-10-CM | POA: Diagnosis not present

## 2023-04-25 DIAGNOSIS — D631 Anemia in chronic kidney disease: Secondary | ICD-10-CM | POA: Diagnosis not present

## 2023-04-25 DIAGNOSIS — Z992 Dependence on renal dialysis: Secondary | ICD-10-CM | POA: Diagnosis not present

## 2023-04-25 DIAGNOSIS — N186 End stage renal disease: Secondary | ICD-10-CM | POA: Diagnosis not present

## 2023-04-27 DIAGNOSIS — R197 Diarrhea, unspecified: Secondary | ICD-10-CM | POA: Diagnosis not present

## 2023-04-27 DIAGNOSIS — N2581 Secondary hyperparathyroidism of renal origin: Secondary | ICD-10-CM | POA: Diagnosis not present

## 2023-04-27 DIAGNOSIS — E876 Hypokalemia: Secondary | ICD-10-CM | POA: Diagnosis not present

## 2023-04-27 DIAGNOSIS — D631 Anemia in chronic kidney disease: Secondary | ICD-10-CM | POA: Diagnosis not present

## 2023-04-27 DIAGNOSIS — N186 End stage renal disease: Secondary | ICD-10-CM | POA: Diagnosis not present

## 2023-04-27 DIAGNOSIS — D689 Coagulation defect, unspecified: Secondary | ICD-10-CM | POA: Diagnosis not present

## 2023-04-27 DIAGNOSIS — Z992 Dependence on renal dialysis: Secondary | ICD-10-CM | POA: Diagnosis not present

## 2023-04-27 DIAGNOSIS — D509 Iron deficiency anemia, unspecified: Secondary | ICD-10-CM | POA: Diagnosis not present

## 2023-04-30 DIAGNOSIS — D689 Coagulation defect, unspecified: Secondary | ICD-10-CM | POA: Diagnosis not present

## 2023-04-30 DIAGNOSIS — E876 Hypokalemia: Secondary | ICD-10-CM | POA: Diagnosis not present

## 2023-04-30 DIAGNOSIS — D631 Anemia in chronic kidney disease: Secondary | ICD-10-CM | POA: Diagnosis not present

## 2023-04-30 DIAGNOSIS — N186 End stage renal disease: Secondary | ICD-10-CM | POA: Diagnosis not present

## 2023-04-30 DIAGNOSIS — R197 Diarrhea, unspecified: Secondary | ICD-10-CM | POA: Diagnosis not present

## 2023-04-30 DIAGNOSIS — Z992 Dependence on renal dialysis: Secondary | ICD-10-CM | POA: Diagnosis not present

## 2023-04-30 DIAGNOSIS — N2581 Secondary hyperparathyroidism of renal origin: Secondary | ICD-10-CM | POA: Diagnosis not present

## 2023-04-30 DIAGNOSIS — D509 Iron deficiency anemia, unspecified: Secondary | ICD-10-CM | POA: Diagnosis not present

## 2023-05-02 DIAGNOSIS — N2581 Secondary hyperparathyroidism of renal origin: Secondary | ICD-10-CM | POA: Diagnosis not present

## 2023-05-02 DIAGNOSIS — Z992 Dependence on renal dialysis: Secondary | ICD-10-CM | POA: Diagnosis not present

## 2023-05-02 DIAGNOSIS — D509 Iron deficiency anemia, unspecified: Secondary | ICD-10-CM | POA: Diagnosis not present

## 2023-05-02 DIAGNOSIS — E876 Hypokalemia: Secondary | ICD-10-CM | POA: Diagnosis not present

## 2023-05-02 DIAGNOSIS — D689 Coagulation defect, unspecified: Secondary | ICD-10-CM | POA: Diagnosis not present

## 2023-05-02 DIAGNOSIS — R197 Diarrhea, unspecified: Secondary | ICD-10-CM | POA: Diagnosis not present

## 2023-05-02 DIAGNOSIS — N186 End stage renal disease: Secondary | ICD-10-CM | POA: Diagnosis not present

## 2023-05-02 DIAGNOSIS — D631 Anemia in chronic kidney disease: Secondary | ICD-10-CM | POA: Diagnosis not present

## 2023-05-04 DIAGNOSIS — R197 Diarrhea, unspecified: Secondary | ICD-10-CM | POA: Diagnosis not present

## 2023-05-04 DIAGNOSIS — D631 Anemia in chronic kidney disease: Secondary | ICD-10-CM | POA: Diagnosis not present

## 2023-05-04 DIAGNOSIS — N2581 Secondary hyperparathyroidism of renal origin: Secondary | ICD-10-CM | POA: Diagnosis not present

## 2023-05-04 DIAGNOSIS — N186 End stage renal disease: Secondary | ICD-10-CM | POA: Diagnosis not present

## 2023-05-04 DIAGNOSIS — D509 Iron deficiency anemia, unspecified: Secondary | ICD-10-CM | POA: Diagnosis not present

## 2023-05-04 DIAGNOSIS — Z992 Dependence on renal dialysis: Secondary | ICD-10-CM | POA: Diagnosis not present

## 2023-05-04 DIAGNOSIS — D689 Coagulation defect, unspecified: Secondary | ICD-10-CM | POA: Diagnosis not present

## 2023-05-04 DIAGNOSIS — E876 Hypokalemia: Secondary | ICD-10-CM | POA: Diagnosis not present

## 2023-05-07 DIAGNOSIS — D631 Anemia in chronic kidney disease: Secondary | ICD-10-CM | POA: Diagnosis not present

## 2023-05-07 DIAGNOSIS — Z992 Dependence on renal dialysis: Secondary | ICD-10-CM | POA: Diagnosis not present

## 2023-05-07 DIAGNOSIS — D509 Iron deficiency anemia, unspecified: Secondary | ICD-10-CM | POA: Diagnosis not present

## 2023-05-07 DIAGNOSIS — D689 Coagulation defect, unspecified: Secondary | ICD-10-CM | POA: Diagnosis not present

## 2023-05-07 DIAGNOSIS — E876 Hypokalemia: Secondary | ICD-10-CM | POA: Diagnosis not present

## 2023-05-07 DIAGNOSIS — N2581 Secondary hyperparathyroidism of renal origin: Secondary | ICD-10-CM | POA: Diagnosis not present

## 2023-05-07 DIAGNOSIS — N186 End stage renal disease: Secondary | ICD-10-CM | POA: Diagnosis not present

## 2023-05-07 DIAGNOSIS — R197 Diarrhea, unspecified: Secondary | ICD-10-CM | POA: Diagnosis not present

## 2023-05-09 DIAGNOSIS — R197 Diarrhea, unspecified: Secondary | ICD-10-CM | POA: Diagnosis not present

## 2023-05-09 DIAGNOSIS — E876 Hypokalemia: Secondary | ICD-10-CM | POA: Diagnosis not present

## 2023-05-09 DIAGNOSIS — N186 End stage renal disease: Secondary | ICD-10-CM | POA: Diagnosis not present

## 2023-05-09 DIAGNOSIS — Z992 Dependence on renal dialysis: Secondary | ICD-10-CM | POA: Diagnosis not present

## 2023-05-09 DIAGNOSIS — N2581 Secondary hyperparathyroidism of renal origin: Secondary | ICD-10-CM | POA: Diagnosis not present

## 2023-05-09 DIAGNOSIS — I129 Hypertensive chronic kidney disease with stage 1 through stage 4 chronic kidney disease, or unspecified chronic kidney disease: Secondary | ICD-10-CM | POA: Diagnosis not present

## 2023-05-09 DIAGNOSIS — D509 Iron deficiency anemia, unspecified: Secondary | ICD-10-CM | POA: Diagnosis not present

## 2023-05-09 DIAGNOSIS — D631 Anemia in chronic kidney disease: Secondary | ICD-10-CM | POA: Diagnosis not present

## 2023-05-09 DIAGNOSIS — D689 Coagulation defect, unspecified: Secondary | ICD-10-CM | POA: Diagnosis not present

## 2023-05-11 DIAGNOSIS — N2581 Secondary hyperparathyroidism of renal origin: Secondary | ICD-10-CM | POA: Diagnosis not present

## 2023-05-11 DIAGNOSIS — D509 Iron deficiency anemia, unspecified: Secondary | ICD-10-CM | POA: Diagnosis not present

## 2023-05-11 DIAGNOSIS — Z992 Dependence on renal dialysis: Secondary | ICD-10-CM | POA: Diagnosis not present

## 2023-05-11 DIAGNOSIS — D631 Anemia in chronic kidney disease: Secondary | ICD-10-CM | POA: Diagnosis not present

## 2023-05-11 DIAGNOSIS — E876 Hypokalemia: Secondary | ICD-10-CM | POA: Diagnosis not present

## 2023-05-11 DIAGNOSIS — N186 End stage renal disease: Secondary | ICD-10-CM | POA: Diagnosis not present

## 2023-05-11 DIAGNOSIS — R52 Pain, unspecified: Secondary | ICD-10-CM | POA: Diagnosis not present

## 2023-05-11 DIAGNOSIS — D689 Coagulation defect, unspecified: Secondary | ICD-10-CM | POA: Diagnosis not present

## 2023-05-14 DIAGNOSIS — R52 Pain, unspecified: Secondary | ICD-10-CM | POA: Diagnosis not present

## 2023-05-14 DIAGNOSIS — E876 Hypokalemia: Secondary | ICD-10-CM | POA: Diagnosis not present

## 2023-05-14 DIAGNOSIS — D689 Coagulation defect, unspecified: Secondary | ICD-10-CM | POA: Diagnosis not present

## 2023-05-14 DIAGNOSIS — Z992 Dependence on renal dialysis: Secondary | ICD-10-CM | POA: Diagnosis not present

## 2023-05-14 DIAGNOSIS — D509 Iron deficiency anemia, unspecified: Secondary | ICD-10-CM | POA: Diagnosis not present

## 2023-05-14 DIAGNOSIS — N2581 Secondary hyperparathyroidism of renal origin: Secondary | ICD-10-CM | POA: Diagnosis not present

## 2023-05-14 DIAGNOSIS — N186 End stage renal disease: Secondary | ICD-10-CM | POA: Diagnosis not present

## 2023-05-14 DIAGNOSIS — D631 Anemia in chronic kidney disease: Secondary | ICD-10-CM | POA: Diagnosis not present

## 2023-05-16 ENCOUNTER — Ambulatory Visit (INDEPENDENT_AMBULATORY_CARE_PROVIDER_SITE_OTHER): Payer: Medicare Other

## 2023-05-16 DIAGNOSIS — R52 Pain, unspecified: Secondary | ICD-10-CM | POA: Diagnosis not present

## 2023-05-16 DIAGNOSIS — D509 Iron deficiency anemia, unspecified: Secondary | ICD-10-CM | POA: Diagnosis not present

## 2023-05-16 DIAGNOSIS — I442 Atrioventricular block, complete: Secondary | ICD-10-CM | POA: Diagnosis not present

## 2023-05-16 DIAGNOSIS — N2581 Secondary hyperparathyroidism of renal origin: Secondary | ICD-10-CM | POA: Diagnosis not present

## 2023-05-16 DIAGNOSIS — E876 Hypokalemia: Secondary | ICD-10-CM | POA: Diagnosis not present

## 2023-05-16 DIAGNOSIS — Z992 Dependence on renal dialysis: Secondary | ICD-10-CM | POA: Diagnosis not present

## 2023-05-16 DIAGNOSIS — N186 End stage renal disease: Secondary | ICD-10-CM | POA: Diagnosis not present

## 2023-05-16 DIAGNOSIS — D689 Coagulation defect, unspecified: Secondary | ICD-10-CM | POA: Diagnosis not present

## 2023-05-16 DIAGNOSIS — D631 Anemia in chronic kidney disease: Secondary | ICD-10-CM | POA: Diagnosis not present

## 2023-05-16 LAB — CUP PACEART REMOTE DEVICE CHECK
Battery Remaining Longevity: 22 mo
Battery Voltage: 2.83 V
Brady Statistic RA Percent Paced: 0.61 %
Brady Statistic RV Percent Paced: 98.7 %
Date Time Interrogation Session: 20250506201138
Implantable Lead Connection Status: 753985
Implantable Lead Connection Status: 753985
Implantable Lead Implant Date: 20181217
Implantable Lead Implant Date: 20181217
Implantable Lead Location: 753859
Implantable Lead Location: 753859
Implantable Lead Model: 3830
Implantable Lead Model: 5076
Implantable Pulse Generator Implant Date: 20181217
Lead Channel Impedance Value: 285 Ohm
Lead Channel Impedance Value: 323 Ohm
Lead Channel Impedance Value: 399 Ohm
Lead Channel Impedance Value: 456 Ohm
Lead Channel Pacing Threshold Amplitude: 0.5 V
Lead Channel Pacing Threshold Amplitude: 0.75 V
Lead Channel Pacing Threshold Pulse Width: 0.4 ms
Lead Channel Pacing Threshold Pulse Width: 0.4 ms
Lead Channel Sensing Intrinsic Amplitude: 0.25 mV
Lead Channel Sensing Intrinsic Amplitude: 0.25 mV
Lead Channel Sensing Intrinsic Amplitude: 9.375 mV
Lead Channel Sensing Intrinsic Amplitude: 9.375 mV
Lead Channel Setting Pacing Amplitude: 2 V
Lead Channel Setting Pacing Amplitude: 2.5 V
Lead Channel Setting Pacing Pulse Width: 1 ms
Lead Channel Setting Sensing Sensitivity: 1.2 mV
Zone Setting Status: 755011
Zone Setting Status: 755011

## 2023-05-18 DIAGNOSIS — D509 Iron deficiency anemia, unspecified: Secondary | ICD-10-CM | POA: Diagnosis not present

## 2023-05-18 DIAGNOSIS — E876 Hypokalemia: Secondary | ICD-10-CM | POA: Diagnosis not present

## 2023-05-18 DIAGNOSIS — N186 End stage renal disease: Secondary | ICD-10-CM | POA: Diagnosis not present

## 2023-05-18 DIAGNOSIS — R52 Pain, unspecified: Secondary | ICD-10-CM | POA: Diagnosis not present

## 2023-05-18 DIAGNOSIS — Z992 Dependence on renal dialysis: Secondary | ICD-10-CM | POA: Diagnosis not present

## 2023-05-18 DIAGNOSIS — D689 Coagulation defect, unspecified: Secondary | ICD-10-CM | POA: Diagnosis not present

## 2023-05-18 DIAGNOSIS — N2581 Secondary hyperparathyroidism of renal origin: Secondary | ICD-10-CM | POA: Diagnosis not present

## 2023-05-18 DIAGNOSIS — D631 Anemia in chronic kidney disease: Secondary | ICD-10-CM | POA: Diagnosis not present

## 2023-05-21 DIAGNOSIS — N186 End stage renal disease: Secondary | ICD-10-CM | POA: Diagnosis not present

## 2023-05-21 DIAGNOSIS — Z992 Dependence on renal dialysis: Secondary | ICD-10-CM | POA: Diagnosis not present

## 2023-05-21 DIAGNOSIS — D631 Anemia in chronic kidney disease: Secondary | ICD-10-CM | POA: Diagnosis not present

## 2023-05-21 DIAGNOSIS — D509 Iron deficiency anemia, unspecified: Secondary | ICD-10-CM | POA: Diagnosis not present

## 2023-05-21 DIAGNOSIS — D689 Coagulation defect, unspecified: Secondary | ICD-10-CM | POA: Diagnosis not present

## 2023-05-21 DIAGNOSIS — R52 Pain, unspecified: Secondary | ICD-10-CM | POA: Diagnosis not present

## 2023-05-21 DIAGNOSIS — N2581 Secondary hyperparathyroidism of renal origin: Secondary | ICD-10-CM | POA: Diagnosis not present

## 2023-05-21 DIAGNOSIS — E876 Hypokalemia: Secondary | ICD-10-CM | POA: Diagnosis not present

## 2023-05-23 DIAGNOSIS — R52 Pain, unspecified: Secondary | ICD-10-CM | POA: Diagnosis not present

## 2023-05-23 DIAGNOSIS — D509 Iron deficiency anemia, unspecified: Secondary | ICD-10-CM | POA: Diagnosis not present

## 2023-05-23 DIAGNOSIS — D631 Anemia in chronic kidney disease: Secondary | ICD-10-CM | POA: Diagnosis not present

## 2023-05-23 DIAGNOSIS — N2581 Secondary hyperparathyroidism of renal origin: Secondary | ICD-10-CM | POA: Diagnosis not present

## 2023-05-23 DIAGNOSIS — D689 Coagulation defect, unspecified: Secondary | ICD-10-CM | POA: Diagnosis not present

## 2023-05-23 DIAGNOSIS — Z992 Dependence on renal dialysis: Secondary | ICD-10-CM | POA: Diagnosis not present

## 2023-05-23 DIAGNOSIS — N186 End stage renal disease: Secondary | ICD-10-CM | POA: Diagnosis not present

## 2023-05-23 DIAGNOSIS — E876 Hypokalemia: Secondary | ICD-10-CM | POA: Diagnosis not present

## 2023-05-25 DIAGNOSIS — N2581 Secondary hyperparathyroidism of renal origin: Secondary | ICD-10-CM | POA: Diagnosis not present

## 2023-05-25 DIAGNOSIS — R52 Pain, unspecified: Secondary | ICD-10-CM | POA: Diagnosis not present

## 2023-05-25 DIAGNOSIS — E876 Hypokalemia: Secondary | ICD-10-CM | POA: Diagnosis not present

## 2023-05-25 DIAGNOSIS — Z992 Dependence on renal dialysis: Secondary | ICD-10-CM | POA: Diagnosis not present

## 2023-05-25 DIAGNOSIS — D689 Coagulation defect, unspecified: Secondary | ICD-10-CM | POA: Diagnosis not present

## 2023-05-25 DIAGNOSIS — D509 Iron deficiency anemia, unspecified: Secondary | ICD-10-CM | POA: Diagnosis not present

## 2023-05-25 DIAGNOSIS — N186 End stage renal disease: Secondary | ICD-10-CM | POA: Diagnosis not present

## 2023-05-25 DIAGNOSIS — D631 Anemia in chronic kidney disease: Secondary | ICD-10-CM | POA: Diagnosis not present

## 2023-05-28 DIAGNOSIS — N2581 Secondary hyperparathyroidism of renal origin: Secondary | ICD-10-CM | POA: Diagnosis not present

## 2023-05-28 DIAGNOSIS — E876 Hypokalemia: Secondary | ICD-10-CM | POA: Diagnosis not present

## 2023-05-28 DIAGNOSIS — D631 Anemia in chronic kidney disease: Secondary | ICD-10-CM | POA: Diagnosis not present

## 2023-05-28 DIAGNOSIS — R52 Pain, unspecified: Secondary | ICD-10-CM | POA: Diagnosis not present

## 2023-05-28 DIAGNOSIS — D689 Coagulation defect, unspecified: Secondary | ICD-10-CM | POA: Diagnosis not present

## 2023-05-28 DIAGNOSIS — Z992 Dependence on renal dialysis: Secondary | ICD-10-CM | POA: Diagnosis not present

## 2023-05-28 DIAGNOSIS — D509 Iron deficiency anemia, unspecified: Secondary | ICD-10-CM | POA: Diagnosis not present

## 2023-05-28 DIAGNOSIS — N186 End stage renal disease: Secondary | ICD-10-CM | POA: Diagnosis not present

## 2023-05-30 DIAGNOSIS — N186 End stage renal disease: Secondary | ICD-10-CM | POA: Diagnosis not present

## 2023-05-30 DIAGNOSIS — D689 Coagulation defect, unspecified: Secondary | ICD-10-CM | POA: Diagnosis not present

## 2023-05-30 DIAGNOSIS — R52 Pain, unspecified: Secondary | ICD-10-CM | POA: Diagnosis not present

## 2023-05-30 DIAGNOSIS — E876 Hypokalemia: Secondary | ICD-10-CM | POA: Diagnosis not present

## 2023-05-30 DIAGNOSIS — N2581 Secondary hyperparathyroidism of renal origin: Secondary | ICD-10-CM | POA: Diagnosis not present

## 2023-05-30 DIAGNOSIS — Z992 Dependence on renal dialysis: Secondary | ICD-10-CM | POA: Diagnosis not present

## 2023-05-30 DIAGNOSIS — D509 Iron deficiency anemia, unspecified: Secondary | ICD-10-CM | POA: Diagnosis not present

## 2023-05-30 DIAGNOSIS — D631 Anemia in chronic kidney disease: Secondary | ICD-10-CM | POA: Diagnosis not present

## 2023-06-01 DIAGNOSIS — R52 Pain, unspecified: Secondary | ICD-10-CM | POA: Diagnosis not present

## 2023-06-01 DIAGNOSIS — N2581 Secondary hyperparathyroidism of renal origin: Secondary | ICD-10-CM | POA: Diagnosis not present

## 2023-06-01 DIAGNOSIS — N186 End stage renal disease: Secondary | ICD-10-CM | POA: Diagnosis not present

## 2023-06-01 DIAGNOSIS — D509 Iron deficiency anemia, unspecified: Secondary | ICD-10-CM | POA: Diagnosis not present

## 2023-06-01 DIAGNOSIS — Z992 Dependence on renal dialysis: Secondary | ICD-10-CM | POA: Diagnosis not present

## 2023-06-01 DIAGNOSIS — D689 Coagulation defect, unspecified: Secondary | ICD-10-CM | POA: Diagnosis not present

## 2023-06-01 DIAGNOSIS — E876 Hypokalemia: Secondary | ICD-10-CM | POA: Diagnosis not present

## 2023-06-01 DIAGNOSIS — D631 Anemia in chronic kidney disease: Secondary | ICD-10-CM | POA: Diagnosis not present

## 2023-06-04 DIAGNOSIS — Z992 Dependence on renal dialysis: Secondary | ICD-10-CM | POA: Diagnosis not present

## 2023-06-04 DIAGNOSIS — N186 End stage renal disease: Secondary | ICD-10-CM | POA: Diagnosis not present

## 2023-06-04 DIAGNOSIS — R52 Pain, unspecified: Secondary | ICD-10-CM | POA: Diagnosis not present

## 2023-06-04 DIAGNOSIS — D689 Coagulation defect, unspecified: Secondary | ICD-10-CM | POA: Diagnosis not present

## 2023-06-04 DIAGNOSIS — D631 Anemia in chronic kidney disease: Secondary | ICD-10-CM | POA: Diagnosis not present

## 2023-06-04 DIAGNOSIS — D509 Iron deficiency anemia, unspecified: Secondary | ICD-10-CM | POA: Diagnosis not present

## 2023-06-04 DIAGNOSIS — N2581 Secondary hyperparathyroidism of renal origin: Secondary | ICD-10-CM | POA: Diagnosis not present

## 2023-06-04 DIAGNOSIS — E876 Hypokalemia: Secondary | ICD-10-CM | POA: Diagnosis not present

## 2023-06-06 ENCOUNTER — Ambulatory Visit: Payer: Self-pay | Admitting: Cardiology

## 2023-06-06 DIAGNOSIS — E876 Hypokalemia: Secondary | ICD-10-CM | POA: Diagnosis not present

## 2023-06-06 DIAGNOSIS — Z992 Dependence on renal dialysis: Secondary | ICD-10-CM | POA: Diagnosis not present

## 2023-06-06 DIAGNOSIS — D631 Anemia in chronic kidney disease: Secondary | ICD-10-CM | POA: Diagnosis not present

## 2023-06-06 DIAGNOSIS — N2581 Secondary hyperparathyroidism of renal origin: Secondary | ICD-10-CM | POA: Diagnosis not present

## 2023-06-06 DIAGNOSIS — D509 Iron deficiency anemia, unspecified: Secondary | ICD-10-CM | POA: Diagnosis not present

## 2023-06-06 DIAGNOSIS — N186 End stage renal disease: Secondary | ICD-10-CM | POA: Diagnosis not present

## 2023-06-06 DIAGNOSIS — D689 Coagulation defect, unspecified: Secondary | ICD-10-CM | POA: Diagnosis not present

## 2023-06-06 DIAGNOSIS — R52 Pain, unspecified: Secondary | ICD-10-CM | POA: Diagnosis not present

## 2023-06-07 DIAGNOSIS — D509 Iron deficiency anemia, unspecified: Secondary | ICD-10-CM | POA: Diagnosis not present

## 2023-06-07 DIAGNOSIS — N186 End stage renal disease: Secondary | ICD-10-CM | POA: Diagnosis not present

## 2023-06-07 DIAGNOSIS — E876 Hypokalemia: Secondary | ICD-10-CM | POA: Diagnosis not present

## 2023-06-07 DIAGNOSIS — N2581 Secondary hyperparathyroidism of renal origin: Secondary | ICD-10-CM | POA: Diagnosis not present

## 2023-06-07 DIAGNOSIS — Z992 Dependence on renal dialysis: Secondary | ICD-10-CM | POA: Diagnosis not present

## 2023-06-07 DIAGNOSIS — D689 Coagulation defect, unspecified: Secondary | ICD-10-CM | POA: Diagnosis not present

## 2023-06-07 DIAGNOSIS — R52 Pain, unspecified: Secondary | ICD-10-CM | POA: Diagnosis not present

## 2023-06-07 DIAGNOSIS — D631 Anemia in chronic kidney disease: Secondary | ICD-10-CM | POA: Diagnosis not present

## 2023-06-09 DIAGNOSIS — N186 End stage renal disease: Secondary | ICD-10-CM | POA: Diagnosis not present

## 2023-06-09 DIAGNOSIS — I129 Hypertensive chronic kidney disease with stage 1 through stage 4 chronic kidney disease, or unspecified chronic kidney disease: Secondary | ICD-10-CM | POA: Diagnosis not present

## 2023-06-09 DIAGNOSIS — Z992 Dependence on renal dialysis: Secondary | ICD-10-CM | POA: Diagnosis not present

## 2023-06-11 DIAGNOSIS — D631 Anemia in chronic kidney disease: Secondary | ICD-10-CM | POA: Diagnosis not present

## 2023-06-11 DIAGNOSIS — D689 Coagulation defect, unspecified: Secondary | ICD-10-CM | POA: Diagnosis not present

## 2023-06-11 DIAGNOSIS — N186 End stage renal disease: Secondary | ICD-10-CM | POA: Diagnosis not present

## 2023-06-11 DIAGNOSIS — E876 Hypokalemia: Secondary | ICD-10-CM | POA: Diagnosis not present

## 2023-06-11 DIAGNOSIS — Z992 Dependence on renal dialysis: Secondary | ICD-10-CM | POA: Diagnosis not present

## 2023-06-11 DIAGNOSIS — N2581 Secondary hyperparathyroidism of renal origin: Secondary | ICD-10-CM | POA: Diagnosis not present

## 2023-06-11 DIAGNOSIS — D509 Iron deficiency anemia, unspecified: Secondary | ICD-10-CM | POA: Diagnosis not present

## 2023-06-13 DIAGNOSIS — D509 Iron deficiency anemia, unspecified: Secondary | ICD-10-CM | POA: Diagnosis not present

## 2023-06-13 DIAGNOSIS — N186 End stage renal disease: Secondary | ICD-10-CM | POA: Diagnosis not present

## 2023-06-13 DIAGNOSIS — Z992 Dependence on renal dialysis: Secondary | ICD-10-CM | POA: Diagnosis not present

## 2023-06-13 DIAGNOSIS — N2581 Secondary hyperparathyroidism of renal origin: Secondary | ICD-10-CM | POA: Diagnosis not present

## 2023-06-13 DIAGNOSIS — D689 Coagulation defect, unspecified: Secondary | ICD-10-CM | POA: Diagnosis not present

## 2023-06-13 DIAGNOSIS — E876 Hypokalemia: Secondary | ICD-10-CM | POA: Diagnosis not present

## 2023-06-13 DIAGNOSIS — D631 Anemia in chronic kidney disease: Secondary | ICD-10-CM | POA: Diagnosis not present

## 2023-06-14 DIAGNOSIS — M19011 Primary osteoarthritis, right shoulder: Secondary | ICD-10-CM | POA: Diagnosis not present

## 2023-06-15 DIAGNOSIS — E876 Hypokalemia: Secondary | ICD-10-CM | POA: Diagnosis not present

## 2023-06-15 DIAGNOSIS — D689 Coagulation defect, unspecified: Secondary | ICD-10-CM | POA: Diagnosis not present

## 2023-06-15 DIAGNOSIS — N2581 Secondary hyperparathyroidism of renal origin: Secondary | ICD-10-CM | POA: Diagnosis not present

## 2023-06-15 DIAGNOSIS — D509 Iron deficiency anemia, unspecified: Secondary | ICD-10-CM | POA: Diagnosis not present

## 2023-06-15 DIAGNOSIS — N186 End stage renal disease: Secondary | ICD-10-CM | POA: Diagnosis not present

## 2023-06-15 DIAGNOSIS — D631 Anemia in chronic kidney disease: Secondary | ICD-10-CM | POA: Diagnosis not present

## 2023-06-15 DIAGNOSIS — Z992 Dependence on renal dialysis: Secondary | ICD-10-CM | POA: Diagnosis not present

## 2023-06-18 DIAGNOSIS — D631 Anemia in chronic kidney disease: Secondary | ICD-10-CM | POA: Diagnosis not present

## 2023-06-18 DIAGNOSIS — N2581 Secondary hyperparathyroidism of renal origin: Secondary | ICD-10-CM | POA: Diagnosis not present

## 2023-06-18 DIAGNOSIS — Z992 Dependence on renal dialysis: Secondary | ICD-10-CM | POA: Diagnosis not present

## 2023-06-18 DIAGNOSIS — E876 Hypokalemia: Secondary | ICD-10-CM | POA: Diagnosis not present

## 2023-06-18 DIAGNOSIS — N186 End stage renal disease: Secondary | ICD-10-CM | POA: Diagnosis not present

## 2023-06-18 DIAGNOSIS — D509 Iron deficiency anemia, unspecified: Secondary | ICD-10-CM | POA: Diagnosis not present

## 2023-06-18 DIAGNOSIS — D689 Coagulation defect, unspecified: Secondary | ICD-10-CM | POA: Diagnosis not present

## 2023-06-20 DIAGNOSIS — N2581 Secondary hyperparathyroidism of renal origin: Secondary | ICD-10-CM | POA: Diagnosis not present

## 2023-06-20 DIAGNOSIS — D509 Iron deficiency anemia, unspecified: Secondary | ICD-10-CM | POA: Diagnosis not present

## 2023-06-20 DIAGNOSIS — Z992 Dependence on renal dialysis: Secondary | ICD-10-CM | POA: Diagnosis not present

## 2023-06-20 DIAGNOSIS — N186 End stage renal disease: Secondary | ICD-10-CM | POA: Diagnosis not present

## 2023-06-20 DIAGNOSIS — D631 Anemia in chronic kidney disease: Secondary | ICD-10-CM | POA: Diagnosis not present

## 2023-06-20 DIAGNOSIS — D689 Coagulation defect, unspecified: Secondary | ICD-10-CM | POA: Diagnosis not present

## 2023-06-20 DIAGNOSIS — E876 Hypokalemia: Secondary | ICD-10-CM | POA: Diagnosis not present

## 2023-06-21 ENCOUNTER — Encounter: Payer: Self-pay | Admitting: Emergency Medicine

## 2023-06-21 ENCOUNTER — Ambulatory Visit: Admitting: Emergency Medicine

## 2023-06-21 VITALS — BP 138/76 | HR 71 | Ht 66.0 in | Wt 154.2 lb

## 2023-06-21 DIAGNOSIS — I480 Paroxysmal atrial fibrillation: Secondary | ICD-10-CM | POA: Diagnosis not present

## 2023-06-21 DIAGNOSIS — N186 End stage renal disease: Secondary | ICD-10-CM

## 2023-06-21 DIAGNOSIS — G4733 Obstructive sleep apnea (adult) (pediatric): Secondary | ICD-10-CM | POA: Diagnosis not present

## 2023-06-21 DIAGNOSIS — Z87891 Personal history of nicotine dependence: Secondary | ICD-10-CM

## 2023-06-21 DIAGNOSIS — R9389 Abnormal findings on diagnostic imaging of other specified body structures: Secondary | ICD-10-CM | POA: Diagnosis not present

## 2023-06-21 DIAGNOSIS — N2889 Other specified disorders of kidney and ureter: Secondary | ICD-10-CM

## 2023-06-21 NOTE — Progress Notes (Addendum)
 Subjective:    Patient ID: Paul Price, male    DOB: September 10, 1940, 83 y.o.   MRN: [id number]  HPI 83 year old man, former smoker (30 pack years), history of renal cell carcinoma (resection ?), hypertension, coronary artery disease/CABG, A-fib with pacer, end-stage renal disease on hemodialysis, LLE DVT, peripheral vascular disease with right hand steal in the setting of right upper extremity fistula placement. OSA on CPAP.  He is referred today for evaluation abnormal chest imaging.  He has a history of renal cell carcinoma, treated with nephrectomy on March 31, 2019, and undergoes regular surveillance scans every six months. A recent scan in April 2025 identified a new pulmonary nodule, described as 'less than the size of a marble.' No respiratory symptoms such as difficulty breathing or chest pain are present. He uses a CPAP machine nightly for sleep apnea and is compliant with its use. No supplemental oxygen is used at home.  His significant past medical history includes hypertension, coronary artery disease, atrial fibrillation, and end stage renal disease, for which he receives hemodialysis three times a week (Monday, Wednesday, and Friday). He also has a history of deep vein thrombosis and is on blood thinners.  Social history reveals he is a former smoker with a history of smoking for approximately five years, quitting many years ago. 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 He served in the [employer] for nine years but did not have Engineer, agricultural.  Family history is notable for heart disease but no known history of cancer. No family history of lung cancer.  Addendum: The report from his CT scan of the chest, abdomen, pelvis is now available. Done 03/01/2023 > shows no enlarged mediastinal or hilar adenopathy, a spiculated nodule in the posterior right  upper lobe now 1.2 x 0.7 cm, previously 0.9 x 0.6 cm 1 year ago.   Review of Systems As per HPI  Past Medical History:  Diagnosis Date   Arthritis    shoulders (09/07/2016)   Atrial fibrillation (HCC)    BPH (benign prostatic hyperplasia)    Chronic kidney disease (CKD), stage III (moderate) (HCC)    Coronary artery disease    DDD (degenerative disc disease), cervical    DVT (deep venous thrombosis) (HCC) 12/2013   LLE   GERD (gastroesophageal reflux disease)    Gout    High cholesterol    History of blood transfusion 09/06/2016   2 u PRBC   History of scarlet fever 1940s   History of stress test 06/2011   No significant ischemia, this is a low risk scan. Clinical correlation recommended Abnormal myocardial perfusion study.   Hx of echocardiogram 05/2009   EF 40-45%, he did have mild annular calcification with mild-to-moderate MR and mild TR as well as aortic valve sclerosis. Estimated RV systolic pressure was 21 mm.   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   Neck pain    not chronic (09/07/2016)   Obesity    OSA on CPAP    Presence of permanent cardiac pacemaker    RBBB    Renal insufficiency    Right renal mass    Spinal stenosis of lumbar region      Family History  Problem Relation Age of Onset   Heart disease Father     - Denies family history  of cancer  Social History   Socioeconomic History   Marital status: Married    Spouse name: Not on file   Number of children: 3   Years of education: Not on file   Highest education level: Not on file  Occupational History   Not on file  Tobacco Use   Smoking status: Former    Current packs/day: 1.00    Average packs/day: 1 pack/day for 30.0 years (30.0 ttl pk-yrs)    Types: Cigarettes   Smokeless tobacco: Never   Tobacco comments:    quit ~ 1985  Vaping Use   Vaping status: Never Used  Substance and Sexual Activity   Alcohol use: Not  Currently    Alcohol/week: 0.0 standard drinks of alcohol    Comment: rare beer   Drug use: No   Sexual activity: Not Currently  Other Topics Concern   Not on file  Social History Narrative   Not on file   Social Drivers of Health   Financial Resource Strain: Not on file  Food Insecurity: Not on file  Transportation Needs: Not on file  Physical Activity: Not on file  Stress: Not on file  Social Connections: Not on file  Intimate Partner Violence: Not on file    Social history reveals he is a former smoker with a history of smoking for approximately five years, quitting many years ago. 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 He served in the [employer] for nine years but did not have Engineer, agricultural.  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     allopurinol  (ZYLOPRIM ) 300 MG tablet Take 300 mg by mouth daily.     aspirin  EC 81 MG tablet Take 81 mg by mouth daily. Swallow whole.     atorvastatin  (LIPITOR) 20 MG tablet TAKE 1 TABLET BY MOUTH DAILY 90 tablet 2   B Complex-C (B-COMPLEX WITH VITAMIN C) tablet Take 1 tablet by mouth daily.     midodrine  (PROAMATINE ) 2.5 MG tablet TAKE 1 TABLET BY MOUTH IMMEDIATELY AFTER DIALYSIS THEN ANOTHER TABLET 4 HOURS LATER 90 tablet 0   omeprazole (PRILOSEC OTC) 20 MG tablet Take 20 mg by mouth  daily.     pantoprazole  (PROTONIX ) 40 MG tablet Take 40 mg by mouth daily.     loperamide (IMODIUM A-D) 2 MG tablet Take 2 mg by mouth as needed for diarrhea or loose stools. (Patient not taking: Reported on 06/21/2023)     No facility-administered medications prior to visit.         Objective:   Physical Exam  Vitals:   06/21/23 0822  BP: 138/76  Pulse: 71  SpO2: 99%  Weight: 154 lb 3.2 oz (69.9 kg)  Height: 5' 6 (1.676 m)   Gen: Pleasant, well-nourished, in no distress,  normal affect  ENT: No lesions,  mouth clear,  oropharynx clear,  no postnasal drip  Neck: No JVD, no stridor  Lungs: No use of accessory muscles, scattered bilateral rhonchi  Cardiovascular: Irregular, borderline bradycardic, no murmur  Musculoskeletal: No deformities, no cyanosis or clubbing  Neuro: alert, awake, non focal  Skin: Tanned skin,  Unroofed abrasion on right leg with bruising.      Assessment & Plan:  Abnormal CT of the chest Pulmonary nodule reported per patient.  I do not have the films available yet but we are working on getting these now.  Low risk for lung cancer but he does have a history of renal cell carcinoma and is at risk for metastatic disease. Newly identified small pulmonary nodule. Differential includes benign versus metastatic lesion due to renal cell carcinoma history. Further evaluation required. - Obtain and review chest imaging report from urology. - Consider repeat chest imaging in 3-6 months to monitor nodule. - Evaluate need for bronchoscopy and biopsy based on imaging findings and clinical suspicion.   End stage renal disease (HCC) End stage renal disease on dialysis Well-managed on hemodialysis with right upper extremity fistula.    OSA on CPAP Good compliance with CPAP.  Continue  Renal mass Renal cell carcinoma Renal cell carcinoma post-nephrectomy. Surveillance imaging ongoing with no recurrence.  A-fib [facility]) Atrial fibrillation with pacemaker.   History of AV block Atrial fibrillation managed with pacemaker.     Racheal Buddle, MD, PhD 06/21/2023, 8:56 AM [facility] [phone] or if no answer before 7:00PM call [phone] For any issues after 7:00PM please call eLink [phone]   Addendum: The CT scan of his chest, abdomen, pelvis from 03/01/2023 is now available, shows an enlarging posterior right upper lobe pulmonary nodule now 1.2 x 0.7 cm.  I believe he needs a CT/PET scan now   Racheal Buddle, MD, PhD 06/21/2023, 12:58 PM [facility] [phone] or if no answer before 7:00PM call [phone] For any issues after 7:00PM please call eLink [phone]

## 2023-06-21 NOTE — Assessment & Plan Note (Signed)
 End stage renal disease on dialysis Well-managed on hemodialysis with right upper extremity fistula.

## 2023-06-21 NOTE — Assessment & Plan Note (Signed)
 Pulmonary nodule reported per patient.  I do not have the films available yet but we are working on getting these now.  Low risk for lung cancer but he does have a history of renal cell carcinoma and is at risk for metastatic disease. Newly identified small pulmonary nodule. Differential includes benign versus metastatic lesion due to renal cell carcinoma history. Further evaluation required. - Obtain and review chest imaging report from urology. - Consider repeat chest imaging in 3-6 months to monitor nodule. - Evaluate need for bronchoscopy and biopsy based on imaging findings and clinical suspicion.

## 2023-06-21 NOTE — Addendum Note (Signed)
 Addended by: Denson Flake on: 06/21/2023 12:59 PM   Modules accepted: Orders

## 2023-06-21 NOTE — Patient Instructions (Addendum)
 VISIT SUMMARY:  Today, we discussed the findings from your recent chest imaging, which showed a new small nodule in your lung. We reviewed your history of renal cell carcinoma and your current treatments for other health conditions.  YOUR PLAN:  -PULMONARY NODULE: A small nodule was found in your lung, which could be benign or related to your previous kidney cancer. We will obtain and review the chest imaging report from your urologist and may repeat the chest imaging in 3-6 months to monitor the nodule. Depending on the findings, we might consider further tests like a bronchoscopy or biopsy.  We will have you follow-up in our office soon to review the study and plan next steps.

## 2023-06-21 NOTE — Assessment & Plan Note (Signed)
 Good compliance with CPAP.  Continue

## 2023-06-21 NOTE — Assessment & Plan Note (Signed)
 Atrial fibrillation with pacemaker.  History of AV block Atrial fibrillation managed with pacemaker.

## 2023-06-21 NOTE — Assessment & Plan Note (Signed)
 Renal cell carcinoma Renal cell carcinoma post-nephrectomy. Surveillance imaging ongoing with no recurrence.

## 2023-06-21 NOTE — H&P (View-Only) (Signed)
 Subjective:    Patient ID: Paul Price, male    DOB: September 10, 1940, 83 y.o.   MRN: [id number]  HPI 83 year old man, former smoker (30 pack years), history of renal cell carcinoma (resection ?), hypertension, coronary artery disease/CABG, A-fib with pacer, end-stage renal disease on hemodialysis, LLE DVT, peripheral vascular disease with right hand steal in the setting of right upper extremity fistula placement. OSA on CPAP.  He is referred today for evaluation abnormal chest imaging.  He has a history of renal cell carcinoma, treated with nephrectomy on March 31, 2019, and undergoes regular surveillance scans every six months. A recent scan in April 2025 identified a new pulmonary nodule, described as 'less than the size of a marble.' No respiratory symptoms such as difficulty breathing or chest pain are present. He uses a CPAP machine nightly for sleep apnea and is compliant with its use. No supplemental oxygen is used at home.  His significant past medical history includes hypertension, coronary artery disease, atrial fibrillation, and end stage renal disease, for which he receives hemodialysis three times a week (Monday, Wednesday, and Friday). He also has a history of deep vein thrombosis and is on blood thinners.  Social history reveals he is a former smoker with a history of smoking for approximately five years, quitting many years ago. 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 He served in the [employer] for nine years but did not have Engineer, agricultural.  Family history is notable for heart disease but no known history of cancer. No family history of lung cancer.  Addendum: The report from his CT scan of the chest, abdomen, pelvis is now available. Done 03/01/2023 > shows no enlarged mediastinal or hilar adenopathy, a spiculated nodule in the posterior right  upper lobe now 1.2 x 0.7 cm, previously 0.9 x 0.6 cm 1 year ago.   Review of Systems As per HPI  Past Medical History:  Diagnosis Date   Arthritis    shoulders (09/07/2016)   Atrial fibrillation (HCC)    BPH (benign prostatic hyperplasia)    Chronic kidney disease (CKD), stage III (moderate) (HCC)    Coronary artery disease    DDD (degenerative disc disease), cervical    DVT (deep venous thrombosis) (HCC) 12/2013   LLE   GERD (gastroesophageal reflux disease)    Gout    High cholesterol    History of blood transfusion 09/06/2016   2 u PRBC   History of scarlet fever 1940s   History of stress test 06/2011   No significant ischemia, this is a low risk scan. Clinical correlation recommended Abnormal myocardial perfusion study.   Hx of echocardiogram 05/2009   EF 40-45%, he did have mild annular calcification with mild-to-moderate MR and mild TR as well as aortic valve sclerosis. Estimated RV systolic pressure was 21 mm.   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   Neck pain    not chronic (09/07/2016)   Obesity    OSA on CPAP    Presence of permanent cardiac pacemaker    RBBB    Renal insufficiency    Right renal mass    Spinal stenosis of lumbar region      Family History  Problem Relation Age of Onset   Heart disease Father     - Denies family history  of cancer  Social History   Socioeconomic History   Marital status: Married    Spouse name: Not on file   Number of children: 3   Years of education: Not on file   Highest education level: Not on file  Occupational History   Not on file  Tobacco Use   Smoking status: Former    Current packs/day: 1.00    Average packs/day: 1 pack/day for 30.0 years (30.0 ttl pk-yrs)    Types: Cigarettes   Smokeless tobacco: Never   Tobacco comments:    quit ~ 1985  Vaping Use   Vaping status: Never Used  Substance and Sexual Activity   Alcohol use: Not  Currently    Alcohol/week: 0.0 standard drinks of alcohol    Comment: rare beer   Drug use: No   Sexual activity: Not Currently  Other Topics Concern   Not on file  Social History Narrative   Not on file   Social Drivers of Health   Financial Resource Strain: Not on file  Food Insecurity: Not on file  Transportation Needs: Not on file  Physical Activity: Not on file  Stress: Not on file  Social Connections: Not on file  Intimate Partner Violence: Not on file    Social history reveals he is a former smoker with a history of smoking for approximately five years, quitting many years ago. 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 He served in the [employer] for nine years but did not have Engineer, agricultural.  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     allopurinol  (ZYLOPRIM ) 300 MG tablet Take 300 mg by mouth daily.     aspirin  EC 81 MG tablet Take 81 mg by mouth daily. Swallow whole.     atorvastatin  (LIPITOR) 20 MG tablet TAKE 1 TABLET BY MOUTH DAILY 90 tablet 2   B Complex-C (B-COMPLEX WITH VITAMIN C) tablet Take 1 tablet by mouth daily.     midodrine  (PROAMATINE ) 2.5 MG tablet TAKE 1 TABLET BY MOUTH IMMEDIATELY AFTER DIALYSIS THEN ANOTHER TABLET 4 HOURS LATER 90 tablet 0   omeprazole (PRILOSEC OTC) 20 MG tablet Take 20 mg by mouth  daily.     pantoprazole  (PROTONIX ) 40 MG tablet Take 40 mg by mouth daily.     loperamide (IMODIUM A-D) 2 MG tablet Take 2 mg by mouth as needed for diarrhea or loose stools. (Patient not taking: Reported on 06/21/2023)     No facility-administered medications prior to visit.         Objective:   Physical Exam  Vitals:   06/21/23 0822  BP: 138/76  Pulse: 71  SpO2: 99%  Weight: 154 lb 3.2 oz (69.9 kg)  Height: 5' 6 (1.676 m)   Gen: Pleasant, well-nourished, in no distress,  normal affect  ENT: No lesions,  mouth clear,  oropharynx clear,  no postnasal drip  Neck: No JVD, no stridor  Lungs: No use of accessory muscles, scattered bilateral rhonchi  Cardiovascular: Irregular, borderline bradycardic, no murmur  Musculoskeletal: No deformities, no cyanosis or clubbing  Neuro: alert, awake, non focal  Skin: Tanned skin,  Unroofed abrasion on right leg with bruising.      Assessment & Plan:  Abnormal CT of the chest Pulmonary nodule reported per patient.  I do not have the films available yet but we are working on getting these now.  Low risk for lung cancer but he does have a history of renal cell carcinoma and is at risk for metastatic disease. Newly identified small pulmonary nodule. Differential includes benign versus metastatic lesion due to renal cell carcinoma history. Further evaluation required. - Obtain and review chest imaging report from urology. - Consider repeat chest imaging in 3-6 months to monitor nodule. - Evaluate need for bronchoscopy and biopsy based on imaging findings and clinical suspicion.   End stage renal disease (HCC) End stage renal disease on dialysis Well-managed on hemodialysis with right upper extremity fistula.    OSA on CPAP Good compliance with CPAP.  Continue  Renal mass Renal cell carcinoma Renal cell carcinoma post-nephrectomy. Surveillance imaging ongoing with no recurrence.  A-fib [facility]) Atrial fibrillation with pacemaker.   History of AV block Atrial fibrillation managed with pacemaker.     Racheal Buddle, MD, PhD 06/21/2023, 8:56 AM [facility] [phone] or if no answer before 7:00PM call [phone] For any issues after 7:00PM please call eLink [phone]   Addendum: The CT scan of his chest, abdomen, pelvis from 03/01/2023 is now available, shows an enlarging posterior right upper lobe pulmonary nodule now 1.2 x 0.7 cm.  I believe he needs a CT/PET scan now   Racheal Buddle, MD, PhD 06/21/2023, 12:58 PM [facility] [phone] or if no answer before 7:00PM call [phone] For any issues after 7:00PM please call eLink [phone]

## 2023-06-21 NOTE — Telephone Encounter (Signed)
 LvM for patient to let them know Dr.Byrum has reviewed his scan done through urology and does want to order a ct and pet scan to be done prior to next visit with sarah on June 24th at 3:30,they will call him to schedule,instructed him to call our office back for confirmation of message

## 2023-06-22 DIAGNOSIS — Z992 Dependence on renal dialysis: Secondary | ICD-10-CM | POA: Diagnosis not present

## 2023-06-22 DIAGNOSIS — N2581 Secondary hyperparathyroidism of renal origin: Secondary | ICD-10-CM | POA: Diagnosis not present

## 2023-06-22 DIAGNOSIS — D689 Coagulation defect, unspecified: Secondary | ICD-10-CM | POA: Diagnosis not present

## 2023-06-22 DIAGNOSIS — E876 Hypokalemia: Secondary | ICD-10-CM | POA: Diagnosis not present

## 2023-06-22 DIAGNOSIS — N186 End stage renal disease: Secondary | ICD-10-CM | POA: Diagnosis not present

## 2023-06-22 DIAGNOSIS — D631 Anemia in chronic kidney disease: Secondary | ICD-10-CM | POA: Diagnosis not present

## 2023-06-22 DIAGNOSIS — D509 Iron deficiency anemia, unspecified: Secondary | ICD-10-CM | POA: Diagnosis not present

## 2023-06-25 DIAGNOSIS — N186 End stage renal disease: Secondary | ICD-10-CM | POA: Diagnosis not present

## 2023-06-25 DIAGNOSIS — D689 Coagulation defect, unspecified: Secondary | ICD-10-CM | POA: Diagnosis not present

## 2023-06-25 DIAGNOSIS — D631 Anemia in chronic kidney disease: Secondary | ICD-10-CM | POA: Diagnosis not present

## 2023-06-25 DIAGNOSIS — D509 Iron deficiency anemia, unspecified: Secondary | ICD-10-CM | POA: Diagnosis not present

## 2023-06-25 DIAGNOSIS — Z992 Dependence on renal dialysis: Secondary | ICD-10-CM | POA: Diagnosis not present

## 2023-06-25 DIAGNOSIS — E876 Hypokalemia: Secondary | ICD-10-CM | POA: Diagnosis not present

## 2023-06-25 DIAGNOSIS — N2581 Secondary hyperparathyroidism of renal origin: Secondary | ICD-10-CM | POA: Diagnosis not present

## 2023-06-26 NOTE — Progress Notes (Signed)
 Remote pacemaker transmission.

## 2023-06-27 DIAGNOSIS — D509 Iron deficiency anemia, unspecified: Secondary | ICD-10-CM | POA: Diagnosis not present

## 2023-06-27 DIAGNOSIS — N2581 Secondary hyperparathyroidism of renal origin: Secondary | ICD-10-CM | POA: Diagnosis not present

## 2023-06-27 DIAGNOSIS — E876 Hypokalemia: Secondary | ICD-10-CM | POA: Diagnosis not present

## 2023-06-27 DIAGNOSIS — D689 Coagulation defect, unspecified: Secondary | ICD-10-CM | POA: Diagnosis not present

## 2023-06-27 DIAGNOSIS — N186 End stage renal disease: Secondary | ICD-10-CM | POA: Diagnosis not present

## 2023-06-27 DIAGNOSIS — D631 Anemia in chronic kidney disease: Secondary | ICD-10-CM | POA: Diagnosis not present

## 2023-06-27 DIAGNOSIS — Z992 Dependence on renal dialysis: Secondary | ICD-10-CM | POA: Diagnosis not present

## 2023-06-28 ENCOUNTER — Encounter (HOSPITAL_COMMUNITY)
Admission: RE | Admit: 2023-06-28 | Discharge: 2023-06-28 | Disposition: A | Discharge status: Home / Self Care | Source: Ambulatory Visit | Attending: Emergency Medicine | Admitting: Emergency Medicine

## 2023-06-28 DIAGNOSIS — R9389 Abnormal findings on diagnostic imaging of other specified body structures: Secondary | ICD-10-CM

## 2023-06-28 LAB — GLUCOSE, CAPILLARY: Glucose-Capillary: 95 mg/dL (ref 70–99)

## 2023-06-29 ENCOUNTER — Ambulatory Visit (HOSPITAL_COMMUNITY)
Admission: RE | Admit: 2023-06-29 | Discharge: 2023-06-29 | Disposition: A | Discharge status: Home / Self Care | Source: Ambulatory Visit | Attending: Emergency Medicine | Admitting: Emergency Medicine

## 2023-06-29 DIAGNOSIS — N186 End stage renal disease: Secondary | ICD-10-CM | POA: Diagnosis not present

## 2023-06-29 DIAGNOSIS — J479 Bronchiectasis, uncomplicated: Secondary | ICD-10-CM | POA: Diagnosis not present

## 2023-06-29 DIAGNOSIS — E876 Hypokalemia: Secondary | ICD-10-CM | POA: Diagnosis not present

## 2023-06-29 DIAGNOSIS — N2581 Secondary hyperparathyroidism of renal origin: Secondary | ICD-10-CM | POA: Diagnosis not present

## 2023-06-29 DIAGNOSIS — D631 Anemia in chronic kidney disease: Secondary | ICD-10-CM | POA: Diagnosis not present

## 2023-06-29 DIAGNOSIS — D509 Iron deficiency anemia, unspecified: Secondary | ICD-10-CM | POA: Diagnosis not present

## 2023-06-29 DIAGNOSIS — R9389 Abnormal findings on diagnostic imaging of other specified body structures: Secondary | ICD-10-CM | POA: Insufficient documentation

## 2023-06-29 DIAGNOSIS — D689 Coagulation defect, unspecified: Secondary | ICD-10-CM | POA: Diagnosis not present

## 2023-06-29 DIAGNOSIS — R911 Solitary pulmonary nodule: Secondary | ICD-10-CM | POA: Diagnosis not present

## 2023-06-29 DIAGNOSIS — C3490 Malignant neoplasm of unspecified part of unspecified bronchus or lung: Secondary | ICD-10-CM | POA: Diagnosis not present

## 2023-06-29 DIAGNOSIS — Z992 Dependence on renal dialysis: Secondary | ICD-10-CM | POA: Diagnosis not present

## 2023-06-29 LAB — GLUCOSE, CAPILLARY: Glucose-Capillary: 95 mg/dL (ref 70–99)

## 2023-06-29 MED ORDER — FLUDEOXYGLUCOSE F - 18 (FDG) INJECTION
7.7000 | Freq: Once | INTRAVENOUS | Status: AC
  Administered 2023-06-29: 7.7 via INTRAVENOUS

## 2023-07-02 DIAGNOSIS — D689 Coagulation defect, unspecified: Secondary | ICD-10-CM | POA: Diagnosis not present

## 2023-07-02 DIAGNOSIS — N2581 Secondary hyperparathyroidism of renal origin: Secondary | ICD-10-CM | POA: Diagnosis not present

## 2023-07-02 DIAGNOSIS — D631 Anemia in chronic kidney disease: Secondary | ICD-10-CM | POA: Diagnosis not present

## 2023-07-02 DIAGNOSIS — N186 End stage renal disease: Secondary | ICD-10-CM | POA: Diagnosis not present

## 2023-07-02 DIAGNOSIS — Z992 Dependence on renal dialysis: Secondary | ICD-10-CM | POA: Diagnosis not present

## 2023-07-02 DIAGNOSIS — D509 Iron deficiency anemia, unspecified: Secondary | ICD-10-CM | POA: Diagnosis not present

## 2023-07-02 DIAGNOSIS — E876 Hypokalemia: Secondary | ICD-10-CM | POA: Diagnosis not present

## 2023-07-03 ENCOUNTER — Telehealth: Payer: Self-pay

## 2023-07-03 ENCOUNTER — Ambulatory Visit: Admitting: Acute Care

## 2023-07-03 NOTE — Telephone Encounter (Signed)
 Sent mychart msg in regards to rescheduling appt

## 2023-07-04 ENCOUNTER — Encounter: Payer: Self-pay | Admitting: Emergency Medicine

## 2023-07-04 ENCOUNTER — Telehealth: Payer: Self-pay

## 2023-07-04 ENCOUNTER — Telehealth: Payer: Self-pay | Admitting: Cardiovascular Disease

## 2023-07-04 ENCOUNTER — Telehealth: Payer: Self-pay | Admitting: Acute Care

## 2023-07-04 ENCOUNTER — Ambulatory Visit: Admitting: Acute Care

## 2023-07-04 ENCOUNTER — Encounter: Payer: Self-pay | Admitting: Acute Care

## 2023-07-04 VITALS — BP 130/63 | HR 78 | Ht 67.0 in | Wt 152.8 lb

## 2023-07-04 DIAGNOSIS — J449 Chronic obstructive pulmonary disease, unspecified: Secondary | ICD-10-CM | POA: Diagnosis not present

## 2023-07-04 DIAGNOSIS — R911 Solitary pulmonary nodule: Secondary | ICD-10-CM | POA: Diagnosis not present

## 2023-07-04 DIAGNOSIS — D509 Iron deficiency anemia, unspecified: Secondary | ICD-10-CM | POA: Diagnosis not present

## 2023-07-04 DIAGNOSIS — Z992 Dependence on renal dialysis: Secondary | ICD-10-CM | POA: Diagnosis not present

## 2023-07-04 DIAGNOSIS — D689 Coagulation defect, unspecified: Secondary | ICD-10-CM | POA: Diagnosis not present

## 2023-07-04 DIAGNOSIS — D631 Anemia in chronic kidney disease: Secondary | ICD-10-CM | POA: Diagnosis not present

## 2023-07-04 DIAGNOSIS — R9389 Abnormal findings on diagnostic imaging of other specified body structures: Secondary | ICD-10-CM | POA: Diagnosis not present

## 2023-07-04 DIAGNOSIS — R942 Abnormal results of pulmonary function studies: Secondary | ICD-10-CM | POA: Diagnosis not present

## 2023-07-04 DIAGNOSIS — Z87891 Personal history of nicotine dependence: Secondary | ICD-10-CM | POA: Diagnosis not present

## 2023-07-04 DIAGNOSIS — N186 End stage renal disease: Secondary | ICD-10-CM | POA: Diagnosis not present

## 2023-07-04 DIAGNOSIS — E876 Hypokalemia: Secondary | ICD-10-CM | POA: Diagnosis not present

## 2023-07-04 DIAGNOSIS — N2581 Secondary hyperparathyroidism of renal origin: Secondary | ICD-10-CM | POA: Diagnosis not present

## 2023-07-04 NOTE — Telephone Encounter (Signed)
   Name: Paul Price  DOB: 1940/05/16  MRN: [id number]  Primary Cardiologist: Debby Sor, MD  Chart reviewed as part of pre-operative protocol coverage. Because of [name]'s past medical history and time since last visit, he will require a follow-up in-office visit in order to better assess preoperative cardiovascular risk.  Pre-op covering staff: - Please schedule appointment and call patient to inform them. If patient already had an upcoming appointment within acceptable timeframe, please add pre-op clearance to the appointment notes so provider is aware. - Please contact requesting surgeon's office via preferred method (i.e, phone, fax) to inform them of need for appointment prior to surgery.    Lyana Asbill, [place]  07/04/2023, 1:19 PM

## 2023-07-04 NOTE — Telephone Encounter (Signed)
 Please schedule the following:  Provider performing procedure: Byrum Diagnosis:  Lung nodule Which side if for nodule / mass? Right Procedure: navigational bronchoscopy with biopsies  Has patient been spoken to by Provider and given informed consent?  Yes, to Kyrstal Monterrosa, NP Anesthesia:  General Do you need Fluro? Yes Duration of procedure: 1.5 Date: 07/17/2023 Alternate Date: 07/24/2023  Time: AM Location:  [facility]  Does patient have OSA?  Yes, on CPAP machine DM? No Or Latex allergy? No Medication Restriction/ Anticoagulate/Antiplatelet: Hold ASA day before procedure Pre-op Labs Ordered:determined by Anesthesia Imaging request:  None needed.  (If, Super[facility]ension CT Chest, please have STAT courier sent to ENDO)

## 2023-07-04 NOTE — Telephone Encounter (Signed)
 Surg Date: 07/17/23

## 2023-07-04 NOTE — Patient Instructions (Addendum)
 It is nice to see you today, We have reviewed your PET scan. There is a lung nodule that looks like it may be cancer that we need to further evaluate. I have placed an order for a bronchoscopy with biopsies.  We have discussed the procedure in detail.  We have reviewed the risks and benefits of the procedure. These include bleeding, infection, puncture of the lung, and adverse reaction to anesthesia. You have agreed to proceed with biopsy to evaluate the right upper lobe nodule. Your procedure will be done by Dr. Lamar Chris. You will need to hold your Aspirin  for 1 day prior to the procedure, so do not take aspirin  on 07/16/2023. We will need to clear this with cardiology.  You will receive a letter today with date time and information pertaining to the procedure. You will need someone to drive you to the procedure, stay with you during the procedure, and stay with you after the procedure. You will also need someone to stay with you for 24 hours after anesthesia to ensure you have cleared and are doing well. You will follow-up with me 1 week after the procedure to review the results and to ensure you are doing well. Call if you need us  prior to the procedure or if you have any questions at all. Please contact office for sooner follow up if symptoms do not improve or worsen or seek emergency care

## 2023-07-04 NOTE — Progress Notes (Signed)
 History of Present Illness Paul Price is a 83 y.o. male with history of renal cancer , referred for a new lung nodule found on surveillance imaging. He is followed by Dr. Shelah.   07/04/2023 Pt. Presents for follow up after PET scan to further evaluate abnormal chest imaging.  Patient states he is at his baseline.  We have reviewed the results of his PET scan.  PET scan shows a hypermetabolic right upper lobe concerning for primary lung cancer.  We discussed options for plan of care moving forward.  We discussed that the best option moving forward would be for robotic assisted navigational bronchoscopy with biopsies for tissue diagnosis.  We reviewed the risks to include bleeding, infection, puncture of the lung, and adverse reaction to anesthesia.  Patient is in agreement with moving forward with robotic assisted navigational bronchoscopy with biopsies of the right upper lobe pulmonary nodule.  We discussed the procedure in detail.  Patient will hold his aspirin  for 1 to 2 days prior to the procedure which is scheduled for 07/17/2023.  He understands he will follow-up with me 1 week after to review the results and determine next best steps in the plan of care.  Case report has been ordered as well as navigational bronchoscopy with biopsies.   Test Results: 06/29/2023 PET Super D Hypermetabolic RIGHT upper lobe pulmonary nodule concerning for primary lung cancer No evidence of metastatic adenopathy or distant metastatic disease. Solitary LEFT kidney.  RIGHT upper lobe pulmonary nodule is suspicious for primary bronchogenic carcinoma. 2. No evidence of metastatic adenopathy in the chest. 3. Mild bronchiectasis and scarring in the lower lobes.     Latest Ref Rng & Units 03/30/2020    8:02 AM 02/12/2020    1:49 AM 02/11/2020    2:31 AM  CBC  WBC 4.0 - 10.5 K/uL  9.4  9.2   Hemoglobin 13.0 - 17.0 g/dL 7.8  8.7  8.5   Hematocrit 39.0 - 52.0 % 23.0  27.9  28.5   Platelets 150 - 400 K/uL  141   145        Latest Ref Rng & Units 03/30/2020    8:02 AM 02/12/2020    1:49 AM 02/11/2020    2:31 AM  BMP  Glucose 70 - 99 mg/dL 886  93  84   BUN 8 - 23 mg/dL 22  26  19    Creatinine 0.61 - 1.24 mg/dL 6.89  6.41  6.76   Sodium 135 - 145 mmol/L 139  141  141   Potassium 3.5 - 5.1 mmol/L 3.2  4.2  4.0   Chloride 98 - 111 mmol/L 93  107  105   CO2 22 - 32 mmol/L  22  25   Calcium  8.9 - 10.3 mg/dL  7.6  7.7     BNP    Component Value Date/Time   BNP 613.2 (H) 01/26/2020 0350    ProBNP No results found for: PROBNP  PFT No results found for: FEV1PRE, FEV1POST, FVCPRE, FVCPOST, TLC, DLCOUNC, PREFEV1FVCRT, PSTFEV1FVCRT  NM PET SUPER D CT Result Date: 07/02/2023 CLINICAL DATA:  Suspicious pulmonary nodule. Concern for primary bronchogenic carcinoma. * Tracking Code: BO * EXAM: CT CHEST WITHOUT CONTRAST TECHNIQUE: Multidetector CT imaging of the chest was performed using thin slice collimation for electromagnetic bronchoscopy planning purposes, without intravenous contrast. RADIATION DOSE REDUCTION: This exam was performed according to the departmental dose-optimization program which includes automated exposure control, adjustment of the mA and/or kV according to patient  size and/or use of iterative reconstruction technique. COMPARISON:  PET-CT scan same day FINDINGS: Cardiovascular: Post CABG.  LEFT-sided pacer. Mediastinum/Nodes: No axillary or supraclavicular adenopathy. No mediastinal or hilar adenopathy. No pericardial fluid. Esophagus normal. Lungs/Pleura: RIGHT upper lobe pulmonary nodule measures 13 mm 9 mm (image 64/series 4. This nodule touches the pleural surface. The nodule was hypermetabolic on comparison FDG PET scan. No additional pulmonary nodules. There is mild bronchiectasis in lower lobes. Mild basilar scarring. Upper Abdomen: Limited view of the liver, kidneys, pancreas are unremarkable. Normal adrenal glands. Musculoskeletal: No aggressive osseous lesion.  IMPRESSION: 1. RIGHT upper lobe pulmonary nodule is suspicious for primary bronchogenic carcinoma. 2. No evidence of metastatic adenopathy in the chest. 3. Mild bronchiectasis and scarring in the lower lobes. Electronically Signed   By: Jackquline Boxer M.D.   On: 07/02/2023 16:37   NM PET Image Initial (PI) Skull Base To Thigh Result Date: 07/02/2023 CLINICAL DATA:  Initial treatment strategy for RIGHT upper lobe pulmonary nodule. EXAM: NUCLEAR MEDICINE PET SKULL BASE TO THIGH TECHNIQUE: 7.7 mCi F-18 FDG was injected intravenously. Full-ring PET imaging was performed from the skull base to thigh after the radiotracer. CT data was obtained and used for attenuation correction and anatomic localization. Fasting blood glucose: 95 mg/dl COMPARISON:  CT 97/79/7974 FINDINGS: NECK: No hypermetabolic lymph nodes in the neck. Incidental CT findings: None. CHEST: Nodule of concern along the posterior aspect of the RIGHT upper lobe measures 12 mm (57/4) and has associated hypermetabolic activity with SUV max equal 3.0 on image 57. The small nodule touches the pleural surface. No additional hypermetabolic pulmonary nodules. No hypermetabolic mediastinal lymph nodes. Incidental CT findings: Post CABG. ABDOMEN/PELVIS: No abnormal hypermetabolic activity within the liver, pancreas, adrenal glands, or spleen. No hypermetabolic lymph nodes in the abdomen or pelvis. Incidental CT findings: Gallstones noted. Solitary LEFT kidney. The solitary kidney demonstrates cortical thinning. Simple cyst of the LEFT kidney. SKELETON: No focal hypermetabolic activity to suggest skeletal metastasis. Incidental CT findings: None. IMPRESSION: 1. Hypermetabolic RIGHT upper lobe pulmonary nodule concerning for primary lung cancer 2. No evidence of metastatic adenopathy or distant metastatic disease. 3. Solitary LEFT kidney. Electronically Signed   By: Jackquline Boxer M.D.   On: 07/02/2023 16:32     Past medical hx Past Medical History:   Diagnosis Date   Arthritis    shoulders (09/07/2016)   Atrial fibrillation (HCC)    BPH (benign prostatic hyperplasia)    Chronic kidney disease (CKD), stage III (moderate) (HCC)    Coronary artery disease    DDD (degenerative disc disease), cervical    DVT (deep venous thrombosis) (HCC) 12/2013   LLE   GERD (gastroesophageal reflux disease)    Gout    High cholesterol    History of blood transfusion 09/06/2016   2 u PRBC   History of scarlet fever 1940s   History of stress test 06/2011   No significant ischemia, this is a low risk scan. Clinical correlation recommended Abnormal myocardial perfusion study.   Hx of echocardiogram 05/2009   EF 40-45%, he did have mild annular calcification with mild-to-moderate MR and mild TR as well as aortic valve sclerosis. Estimated RV systolic pressure was 21 mm.   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   Neck pain    not chronic (09/07/2016)   Obesity    OSA on CPAP    Presence of permanent cardiac pacemaker  RBBB    Renal insufficiency    Right renal mass    Spinal stenosis of lumbar region      Social History   Tobacco Use   Smoking status: Former    Current packs/day: 1.00    Average packs/day: 1 pack/day for 30.0 years (30.0 ttl pk-yrs)    Types: Cigarettes   Smokeless tobacco: Never   Tobacco comments:    quit ~ 1985  Vaping Use   Vaping status: Never Used  Substance Use Topics   Alcohol use: Not Currently    Alcohol/week: 0.0 standard drinks of alcohol    Comment: rare beer   Drug use: No    Mr.Mountz reports that he has quit smoking. His smoking use included cigarettes. He has a 30 pack-year smoking history. He has never used smokeless tobacco. He reports that he does not currently use alcohol. He reports that he does not use drugs.  Tobacco Cessation: Counseling given: Not Answered Tobacco comments: quit ~ 1985 Former smoker with a 30-pack-year  smoking history quit 1985  Past surgical hx, Family hx, Social hx all reviewed.  Current Outpatient Medications on File Prior to Visit  Medication Sig   acetaminophen  (TYLENOL ) 500 MG tablet Take 1,500-2,000 mg by mouth daily as needed for mild pain (pain score 1-3), moderate pain (pain score 4-6) or headache.   allopurinol  (ZYLOPRIM ) 300 MG tablet Take 300 mg by mouth daily.   aspirin  EC 81 MG tablet Take 81 mg by mouth daily. Swallow whole.   atorvastatin  (LIPITOR) 20 MG tablet TAKE 1 TABLET BY MOUTH DAILY   B Complex-C (B-COMPLEX WITH VITAMIN C) tablet Take 1 tablet by mouth daily.   midodrine  (PROAMATINE ) 2.5 MG tablet TAKE 1 TABLET BY MOUTH IMMEDIATELY AFTER DIALYSIS THEN ANOTHER TABLET 4 HOURS LATER   omeprazole (PRILOSEC OTC) 20 MG tablet Take 20 mg by mouth daily.   pantoprazole  (PROTONIX ) 40 MG tablet Take 40 mg by mouth daily.   loperamide (IMODIUM A-D) 2 MG tablet Take 2 mg by mouth as needed for diarrhea or loose stools. (Patient not taking: Reported on 07/04/2023)   No current facility-administered medications on file prior to visit.     Allergies  Allergen Reactions   Gabapentin  Other (See Comments)    Dizziness Loss of mental capacity   Simvastatin Other (See Comments)    Abdominal cramps   Nitrostat [Nitroglycerin] Other (See Comments)    Causes blood pressure to bottom out    Review Of Systems:  Constitutional:   No  weight loss, night sweats,  Fevers, chills, fatigue, or  lassitude.  HEENT:   No headaches,  Difficulty swallowing,  Tooth/dental problems, or  Sore throat,                No sneezing, itching, ear ache, nasal congestion, post nasal drip,   CV:  No chest pain,  Orthopnea, PND, swelling in lower extremities, anasarca, dizziness, palpitations, syncope.   GI  No heartburn, indigestion, abdominal pain, nausea, vomiting, diarrhea, change in bowel habits, loss of appetite, bloody stools.   Resp: No shortness of breath with exertion or at rest.  No  excess mucus, no productive cough,  No non-productive cough,  No coughing up of blood.  No change in color of mucus.  No wheezing.  No chest wall deformity  Skin: no rash or lesions.  GU: no dysuria, change in color of urine, no urgency or frequency.  No flank pain, no hematuria   MS:  No joint  pain or swelling.  No decreased range of motion.  No back pain.  Psych:  No change in mood or affect. No depression or anxiety.  No memory loss.   Vital Signs BP 130/63 (BP Location: Left Arm, Patient Position: Sitting, Cuff Size: Normal)   Pulse 78   Ht 5' 7 (1.702 m)   Wt 152 lb 12.8 oz (69.3 kg)   SpO2 98%   BMI 23.93 kg/m    Physical Exam:  General- No distress,  A&Ox3, pleasant ENT: No sinus tenderness, TM clear, pale nasal mucosa, no oral exudate,no post nasal drip, no LAN Cardiac: S1, S2, regular rate and rhythm, no murmur Chest: No wheeze/ rales/ dullness; no accessory muscle use, no nasal flaring, no sternal retractions, few rhonchi which clears with cough, diminished per bases Abd.: Soft Non-tender, nondistended, bowel sounds positive,Body mass index is 23.93 kg/m.  Ext: No clubbing cyanosis, edema, no obvious skin lesions Neuro: Physical deconditioning, moving all extremities x 4, alert and oriented x 3 Skin: No rashes, warm and dry, no obvious skin lesions Psych: normal mood and behavior   Assessment/Plan Right upper lobe pulmonary nodule hypermetabolic on PET imaging. Former smoker Plan There is a lung nodule that looks like it may be cancer that we need to further evaluate. I have placed an order for a bronchoscopy with biopsies.  We have discussed the procedure in detail.  We have reviewed the risks and benefits of the procedure. These include bleeding, infection, puncture of the lung, and adverse reaction to anesthesia. You have agreed to proceed with biopsy to evaluate the right upper lobe nodule. Your procedure will be done by Dr. Lamar Chris. You will need to  hold your Aspirin  for 1 day prior to the procedure, so do not take aspirin  on 07/16/2023. We will need to clear this with cardiology.  You will receive a letter today with date time and information pertaining to the procedure. You will need someone to drive you to the procedure, stay with you during the procedure, and stay with you after the procedure. You will also need someone to stay with you for 24 hours after anesthesia to ensure you have cleared and are doing well. You will follow-up with me 1 week after the procedure to review the results and to ensure you are doing well. Call if you need us  prior to the procedure or if you have any questions at all. Please contact office for sooner follow up if symptoms do not improve or worsen or seek emergency care      I spent 30 minutes dedicated to the care of this patient on the date of this encounter to include pre-visit review of records, face-to-face time with the patient discussing conditions above, post visit ordering of testing, clinical documentation with the electronic health record, making appropriate referrals as documented, and communicating necessary information to the patient's healthcare team.   Lauraine JULIANNA Lites, NP 07/04/2023  12:44 PM

## 2023-07-04 NOTE — Telephone Encounter (Signed)
 Patient has been scheduled for preop clearance office visit

## 2023-07-04 NOTE — Telephone Encounter (Signed)
 Letter given by [facility] # [id number] Case

## 2023-07-04 NOTE — Telephone Encounter (Signed)
 Called patient cardiology office to put in surgery clearance for biopsy

## 2023-07-04 NOTE — Telephone Encounter (Signed)
   Pre-operative Risk Assessment    Patient Name: [name]  DOB: 1941/01/08 MRN: [id number]   Date of last office visit:  Date of next office visit:    Request for Surgical Clearance    Procedure:  Bronchoscopy with Biopsy  Date of Surgery:  Pending                                Surgeon:  Dr Shelah Surgeon's Group or Practice Name:   Phone number:  [phone] Fax number:  [phone]   Type of Clearance Requested:   Medical and Medicine    Type of Anesthesia:  General    Additional requests/questions:    Signed, Kyra CHRISTELLA Bull   07/04/2023, 1:00 PM

## 2023-07-06 DIAGNOSIS — D689 Coagulation defect, unspecified: Secondary | ICD-10-CM | POA: Diagnosis not present

## 2023-07-06 DIAGNOSIS — D631 Anemia in chronic kidney disease: Secondary | ICD-10-CM | POA: Diagnosis not present

## 2023-07-06 DIAGNOSIS — Z992 Dependence on renal dialysis: Secondary | ICD-10-CM | POA: Diagnosis not present

## 2023-07-06 DIAGNOSIS — E876 Hypokalemia: Secondary | ICD-10-CM | POA: Diagnosis not present

## 2023-07-06 DIAGNOSIS — D509 Iron deficiency anemia, unspecified: Secondary | ICD-10-CM | POA: Diagnosis not present

## 2023-07-06 DIAGNOSIS — N186 End stage renal disease: Secondary | ICD-10-CM | POA: Diagnosis not present

## 2023-07-06 DIAGNOSIS — N2581 Secondary hyperparathyroidism of renal origin: Secondary | ICD-10-CM | POA: Diagnosis not present

## 2023-07-09 DIAGNOSIS — E876 Hypokalemia: Secondary | ICD-10-CM | POA: Diagnosis not present

## 2023-07-09 DIAGNOSIS — D509 Iron deficiency anemia, unspecified: Secondary | ICD-10-CM | POA: Diagnosis not present

## 2023-07-09 DIAGNOSIS — D689 Coagulation defect, unspecified: Secondary | ICD-10-CM | POA: Diagnosis not present

## 2023-07-09 DIAGNOSIS — N2581 Secondary hyperparathyroidism of renal origin: Secondary | ICD-10-CM | POA: Diagnosis not present

## 2023-07-09 DIAGNOSIS — D631 Anemia in chronic kidney disease: Secondary | ICD-10-CM | POA: Diagnosis not present

## 2023-07-09 DIAGNOSIS — I129 Hypertensive chronic kidney disease with stage 1 through stage 4 chronic kidney disease, or unspecified chronic kidney disease: Secondary | ICD-10-CM | POA: Diagnosis not present

## 2023-07-09 DIAGNOSIS — N186 End stage renal disease: Secondary | ICD-10-CM | POA: Diagnosis not present

## 2023-07-09 DIAGNOSIS — Z992 Dependence on renal dialysis: Secondary | ICD-10-CM | POA: Diagnosis not present

## 2023-07-09 NOTE — Progress Notes (Signed)
 " Cardiology Office Note   Date:  07/10/2023  ID:  Paul Price, DOB 1940/09/05, MRN [id number] PCP: Clarice Nottingham, MD  [facility] Providers Cardiologist:  Debby Sor, MD     [facility] Coronary artery disease S/p CABG in 1992 LIMA-LAD and diagonal LHC 2009  Atrectic LIMA graft to LAD and patent vein graft supplying diagonal vessel LHC 2012 Successful PCI/DES (3.0 x 22 mm Promus) to LAD beyond diagonal OSA on CPAP Hypertension History of DVT Kidney cancer s/p right nephrectomy  ESRD on hemodialysis Permanent atrial fibrillation Anemia Hyperlipidemia Sick sinus syndrome s/p PPM implant 12/2016  Seen in 2014 with suspicion of atrial fibrillation with cardiac monitor that noted 9 beat run of nonsustained VT for which he was maintained on low-dose beta-blocker therapy.  He was started on Eliquis  in 2015 for DVT in left leg.  Hospitalization November 2015 with fevers and sudden onset severe back pain.  MRI showed paraspinal myositis without abscess or discitis.  After 1 week hospitalization, he spent 3 weeks at rehab.  He was followed by Dr. Elaine from infectious disease and was cleared March 2016.  Nuclear stress test 2018 was low risk.  EF 50 to 55%, grade 2 diastolic dysfunction, septal wall paradoxical motion, severely dilated LA, mild MR, RV mildly dilated.  Diagnosed with AF RVR Sep 10, 2016 and acute renal failure.  His creatinine had risen to 2.9 but improved to 1.9 with gentle hydration.  He was started on Xarelto  at discharge.  September 2018 he was hospitalized with GI bleed and acute blood loss anemia.  He received 3 units of blood and was cleared to continue OAC.  Echo revealed EF 45 to 50% with diffuse hypocontractility.  Admission December 2018 noted to have Mobitz type I heart block with discontinuation of beta-blocker due to suspicion of sick sinus syndrome.  He underwent pacemaker implant December 26, 2016.  He had severe COVID infection January 2022.  Transesophageal  echo showed EF 40 to 45%, evidence for a left atrial appendage thrombus.  He was transition from Xarelto  to Eliquis  and started on hemodialysis February 06, 2020.  On dialysis Monday Wednesday Friday.  He had difficulty with hypotension during dialysis and takes midodrine .  His wife passed away in 2021-09-10.  Last cardiology clinic visit was 10/31/2022 with Dr. Sor.  He continued to have hypotension during dialysis and takes midodrine  following dialysis episodes.  He had recently had difficulty with right arm dialysis needle going from the vein into artery and was scheduled for follow-up with [facility]. OSA remained well controlled on CPAP. No concerning cardiac symptoms at that visit.   History of Present Illness Discussed the use of AI scribe software for clinical note transcription with the patient, who gave verbal consent to proceed.  History of Present Illness Paul Price is a very pleasant 83 year old male who is here today for preoperative cardiovascular evaluation for bronchoscopy with biopsy. He experiences occasional transient chest pain near the pacemaker site without significant pain during activities. He remains active with housework and yard work and is able to work for thirty minutes and then requires rest.  He does not have significant cardiac symptoms with exertion and is able to achieve > 4 METS activity.  Limitations include striae falls, dizziness, and balance issues. He is compliant with dialysis three times a week and feels well post-dialysis, engaging in daily activities with no significant concerns.  He reports BP has stabilized and he only takes midodrine  as needed.  He  voiced concerns about undergoing bronchoscopy and whether or not he will seek treatment if biopsy reveals cancer.  He recalls being on Xarelto  and Eliquis  but is not recall when they were stopped.  ROS: See HPI  Studies Reviewed EKG Interpretation Date/Time:  Tuesday July 10 2023 09:00:12 EDT Ventricular Rate:   74 PR Interval:    QRS Duration:  156 QT Interval:  454 QTC Calculation: 503 R Axis:   -42  Text Interpretation: Ventricular-paced rhythm with occasional Premature ventricular complexes When compared with ECG of 31-Oct-2022 15:48, Premature ventricular complexes are now Present Confirmed by Percy Browning [phone]) on 07/10/2023 9:23:18 AM     No results found for: LIPOA  Risk Assessment/Calculations  CHA2DS2-VASc Score =    This indicates a  % annual risk of stroke. The patient's score is based upon:             Physical Exam VS:  BP 130/78   Pulse 74   Ht 5' 6 (1.676 m)   Wt 155 lb (70.3 kg)   SpO2 99%   BMI 25.02 kg/m    Wt Readings from Last 3 Encounters:  07/10/23 155 lb (70.3 kg)  07/04/23 152 lb 12.8 oz (69.3 kg)  06/21/23 154 lb 3.2 oz (69.9 kg)    GEN: Well nourished, well developed in no acute distress NECK: No JVD; No carotid bruits CARDIAC: RRR, no murmurs, rubs, gallops RESPIRATORY:  Clear to auscultation without rales, wheezing or rhonchi  ABDOMEN: Soft, non-tender, non-distended EXTREMITIES:  No edema; No deformity    Assessment & Plan Preoperative cardiac evaluation He is scheduled to undergo bronchoscopy with biopsy on 07/17/2023 with Dr. Hortense. According to the Revised Cardiac Risk Index (RCRI), his Perioperative Risk of Major Cardiac Event is (%): 11 which is elevated due to history of IHD, elevated creatinine, possible stroke, but he is doing well from a cardiac perspective. His Functional Capacity in METs is: 5.93 according to the Duke Activity Status Index (DASI) and therefore further cardiac testing is not necessary prior to procedure.  Per office protocol, he may hold aspirin  5 to 7 days prior to procedure and should resume as soon as hemodynamically stable following the procedure.  Atrial Fibrillation   HR is well-controlled. He has no recent symptoms.  On NOAC, although reason is unclear.  He does have a history of GI bleed.  States he was  told to take aspirin  81 mg daily.  He is not on AV nodal blocking agent.  CAD S/p CABG in 1992 with LIMA-LAD and diagonal. LHC 2009 revealed atrectic LIMA graft to LAD and patent vein graft supplying diagonal vessel. LHC 2012 with successful PCI/DES (3.0 x 22 mm Promus) to LAD beyond diagonal. He remains active with > METS activity and denies chest pain, dyspnea, or other symptoms concerning for angina.  No indication for further ischemic evaluation at this time.   Presence of Cardiac Pacemaker   Remote device check 05/15/2023 with normal device function.  No acute concerns today.  Management per EP cardiology.   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  He reports blood pressure has stabilized and he only takes midodrine  as needed.  No acute concerns today.  Management per nephrology.  Stroke/TIA He reports possible history of stroke with no residual deficits. No neurological deficits on exam. Continue aspirin .          Dispo: Dr. Benjaman in 6 months, Dr. Kate in 9 mo (transfer from Dr.  Burnard)  Signed, Rosaline Bane, NP-C "

## 2023-07-10 ENCOUNTER — Other Ambulatory Visit: Payer: Self-pay

## 2023-07-10 ENCOUNTER — Encounter (HOSPITAL_BASED_OUTPATIENT_CLINIC_OR_DEPARTMENT_OTHER): Payer: Self-pay | Admitting: Nurse Practitioner

## 2023-07-10 ENCOUNTER — Ambulatory Visit (HOSPITAL_BASED_OUTPATIENT_CLINIC_OR_DEPARTMENT_OTHER): Admitting: Nurse Practitioner

## 2023-07-10 ENCOUNTER — Encounter: Payer: Self-pay | Admitting: Hematology

## 2023-07-10 ENCOUNTER — Other Ambulatory Visit (HOSPITAL_COMMUNITY): Payer: Self-pay

## 2023-07-10 ENCOUNTER — Other Ambulatory Visit (HOSPITAL_BASED_OUTPATIENT_CLINIC_OR_DEPARTMENT_OTHER): Payer: Self-pay

## 2023-07-10 ENCOUNTER — Encounter (HOSPITAL_COMMUNITY): Payer: Self-pay

## 2023-07-10 VITALS — BP 130/78 | HR 74 | Ht 66.0 in | Wt 155.0 lb

## 2023-07-10 DIAGNOSIS — Z8673 Personal history of transient ischemic attack (TIA), and cerebral infarction without residual deficits: Secondary | ICD-10-CM

## 2023-07-10 DIAGNOSIS — Z950 Presence of cardiac pacemaker: Secondary | ICD-10-CM

## 2023-07-10 DIAGNOSIS — I4821 Permanent atrial fibrillation: Secondary | ICD-10-CM | POA: Diagnosis not present

## 2023-07-10 DIAGNOSIS — I2510 Atherosclerotic heart disease of native coronary artery without angina pectoris: Secondary | ICD-10-CM

## 2023-07-10 DIAGNOSIS — N186 End stage renal disease: Secondary | ICD-10-CM | POA: Diagnosis not present

## 2023-07-10 DIAGNOSIS — Z01810 Encounter for preprocedural cardiovascular examination: Secondary | ICD-10-CM

## 2023-07-10 DIAGNOSIS — Z951 Presence of aortocoronary bypass graft: Secondary | ICD-10-CM | POA: Diagnosis not present

## 2023-07-10 MED ORDER — ALLOPURINOL 300 MG PO TABS
300.0000 mg | ORAL_TABLET | Freq: Every day | ORAL | 1 refills | Status: DC
  Filled 2023-07-10: qty 90, 90d supply, fill #0
  Filled 2023-09-17: qty 90, 90d supply, fill #1

## 2023-07-10 NOTE — Patient Instructions (Signed)
 Medication Instructions:   Your physician recommends that you continue on your current medications as directed. Please refer to the Current Medication list given to you today.   *If you need a refill on your cardiac medications before your next appointment, please call your pharmacy*  Lab Work:  TODAY!!!! LIPID  If you have labs (blood work) drawn today and your tests are completely normal, you will receive your results only by: MyChart Message (if you have MyChart) OR A paper copy in the mail If you have any lab test that is abnormal or we need to change your treatment, we will call you to review the results.  Testing/Procedures:  None ordered.  Follow-Up: At [facility], you and your health needs are our priority.  As part of our continuing mission to provide you with exceptional heart care, our providers are all part of one team.  This team includes your primary Cardiologist (physician) and Advanced Practice Providers or APPs (Physician Assistants and Nurse Practitioners) who all work together to provide you with the care you need, when you need it.  Your next appointment:   5 month(s)  Provider:   Fonda Kitty, MD and  Dr. Kate    We recommend signing up for the patient portal called MyChart.  Sign up information is provided on this After Visit Summary.  MyChart is used to connect with patients for Virtual Visits (Telemedicine).  Patients are able to view lab/test results, encounter notes, upcoming appointments, etc.  Non-urgent messages can be sent to your provider as well.   To learn more about what you can do with MyChart, go to [email or web address].   Other Instructions  Your physician wants you to follow-up in: 9 months with Dr. Kate Lanius will receive a reminder letter in the mail two months in advance. If you don't receive a letter, please call our office to schedule the follow-up appointment.

## 2023-07-11 ENCOUNTER — Other Ambulatory Visit: Payer: Self-pay

## 2023-07-11 ENCOUNTER — Other Ambulatory Visit (HOSPITAL_COMMUNITY): Payer: Self-pay

## 2023-07-11 ENCOUNTER — Ambulatory Visit: Payer: Self-pay | Admitting: Nurse Practitioner

## 2023-07-11 DIAGNOSIS — D631 Anemia in chronic kidney disease: Secondary | ICD-10-CM | POA: Diagnosis not present

## 2023-07-11 DIAGNOSIS — R197 Diarrhea, unspecified: Secondary | ICD-10-CM | POA: Diagnosis not present

## 2023-07-11 DIAGNOSIS — N186 End stage renal disease: Secondary | ICD-10-CM | POA: Diagnosis not present

## 2023-07-11 DIAGNOSIS — E876 Hypokalemia: Secondary | ICD-10-CM | POA: Diagnosis not present

## 2023-07-11 DIAGNOSIS — Z992 Dependence on renal dialysis: Secondary | ICD-10-CM | POA: Diagnosis not present

## 2023-07-11 DIAGNOSIS — D689 Coagulation defect, unspecified: Secondary | ICD-10-CM | POA: Diagnosis not present

## 2023-07-11 DIAGNOSIS — D509 Iron deficiency anemia, unspecified: Secondary | ICD-10-CM | POA: Diagnosis not present

## 2023-07-11 DIAGNOSIS — N2581 Secondary hyperparathyroidism of renal origin: Secondary | ICD-10-CM | POA: Diagnosis not present

## 2023-07-11 LAB — LIPID PANEL
Chol/HDL Ratio: 2.4 ratio (ref 0.0–5.0)
Cholesterol, Total: 157 mg/dL (ref 100–199)
HDL: 66 mg/dL (ref >39)
LDL Chol Calc (NIH): 73 mg/dL (ref 0–99)
Triglycerides: 100 mg/dL (ref 0–149)
VLDL Cholesterol Cal: 18 mg/dL (ref 5–40)

## 2023-07-11 MED ORDER — PANTOPRAZOLE SODIUM 40 MG PO TBEC
40.0000 mg | DELAYED_RELEASE_TABLET | Freq: Every day | ORAL | 11 refills | Status: DC
  Filled 2023-07-11 – 2023-07-18 (×3): qty 30, 30d supply, fill #0
  Filled 2023-08-09 – 2023-08-10 (×2): qty 30, 30d supply, fill #1
  Filled 2023-09-17: qty 30, 30d supply, fill #2
  Filled 2023-10-17: qty 30, 30d supply, fill #3

## 2023-07-11 MED ORDER — GABAPENTIN 300 MG PO CAPS
300.0000 mg | ORAL_CAPSULE | Freq: Every day | ORAL | 2 refills | Status: AC
  Filled 2023-07-11 – 2023-09-17 (×2): qty 30, 30d supply, fill #0

## 2023-07-11 MED ORDER — ATORVASTATIN CALCIUM 20 MG PO TABS
20.0000 mg | ORAL_TABLET | Freq: Every day | ORAL | 2 refills | Status: AC
  Filled 2023-07-11 – 2023-07-18 (×3): qty 90, 90d supply, fill #0

## 2023-07-11 MED ORDER — GABAPENTIN 100 MG PO CAPS
100.0000 mg | ORAL_CAPSULE | Freq: Every day | ORAL | 2 refills | Status: AC
  Filled 2023-07-11 – 2023-07-18 (×2): qty 30, 30d supply, fill #0

## 2023-07-12 ENCOUNTER — Other Ambulatory Visit (HOSPITAL_BASED_OUTPATIENT_CLINIC_OR_DEPARTMENT_OTHER): Payer: Self-pay

## 2023-07-12 ENCOUNTER — Encounter: Payer: Self-pay | Admitting: Internal Medicine

## 2023-07-12 ENCOUNTER — Other Ambulatory Visit (HOSPITAL_COMMUNITY): Payer: Self-pay

## 2023-07-12 ENCOUNTER — Other Ambulatory Visit: Payer: Self-pay

## 2023-07-12 NOTE — Progress Notes (Signed)
 PERIOPERATIVE PRESCRIPTION FOR IMPLANTED CARDIAC DEVICE PROGRAMMING  Patient Information: Name:  MOO GRAVLEY  DOB:  April 30, 1940  MRN:  [id number]    Planned Procedure:  VIDEO BRONCHOSCOPY WITH ENDOBRONCHIAL NAVIGATION - Right  Surgeon:  Lamar Chris, MD  Date of Procedure:  07/17/23  Cautery will be used.  Position during surgery:  Supine   Device Information:  Clinic EP Physician:  Elspeth Sage, MD   Device Type:  Pacemaker Manufacturer and Phone #:  [employer]: [phone] Pacemaker Dependent?:  No. Date of Last Device Check:  05/15/2023 Normal Device Function?:  Yes.    Electrophysiologist's Recommendations:  Have magnet available. Provide continuous ECG monitoring when magnet is used or reprogramming is to be performed.  Procedure should not interfere with device function.  No device programming or magnet placement needed.  Per Device Clinic [address] [place], [place]  7:34 AM 07/12/2023

## 2023-07-13 DIAGNOSIS — D509 Iron deficiency anemia, unspecified: Secondary | ICD-10-CM | POA: Diagnosis not present

## 2023-07-13 DIAGNOSIS — R197 Diarrhea, unspecified: Secondary | ICD-10-CM | POA: Diagnosis not present

## 2023-07-13 DIAGNOSIS — N2581 Secondary hyperparathyroidism of renal origin: Secondary | ICD-10-CM | POA: Diagnosis not present

## 2023-07-13 DIAGNOSIS — D631 Anemia in chronic kidney disease: Secondary | ICD-10-CM | POA: Diagnosis not present

## 2023-07-13 DIAGNOSIS — E876 Hypokalemia: Secondary | ICD-10-CM | POA: Diagnosis not present

## 2023-07-13 DIAGNOSIS — N186 End stage renal disease: Secondary | ICD-10-CM | POA: Diagnosis not present

## 2023-07-13 DIAGNOSIS — D689 Coagulation defect, unspecified: Secondary | ICD-10-CM | POA: Diagnosis not present

## 2023-07-13 DIAGNOSIS — Z992 Dependence on renal dialysis: Secondary | ICD-10-CM | POA: Diagnosis not present

## 2023-07-14 ENCOUNTER — Other Ambulatory Visit (HOSPITAL_COMMUNITY): Payer: Self-pay

## 2023-07-16 ENCOUNTER — Other Ambulatory Visit: Payer: Self-pay

## 2023-07-16 ENCOUNTER — Encounter (HOSPITAL_COMMUNITY): Payer: Self-pay | Admitting: Emergency Medicine

## 2023-07-16 DIAGNOSIS — R197 Diarrhea, unspecified: Secondary | ICD-10-CM | POA: Diagnosis not present

## 2023-07-16 DIAGNOSIS — N2581 Secondary hyperparathyroidism of renal origin: Secondary | ICD-10-CM | POA: Diagnosis not present

## 2023-07-16 DIAGNOSIS — D689 Coagulation defect, unspecified: Secondary | ICD-10-CM | POA: Diagnosis not present

## 2023-07-16 DIAGNOSIS — E876 Hypokalemia: Secondary | ICD-10-CM | POA: Diagnosis not present

## 2023-07-16 DIAGNOSIS — D509 Iron deficiency anemia, unspecified: Secondary | ICD-10-CM | POA: Diagnosis not present

## 2023-07-16 DIAGNOSIS — N186 End stage renal disease: Secondary | ICD-10-CM | POA: Diagnosis not present

## 2023-07-16 DIAGNOSIS — Z992 Dependence on renal dialysis: Secondary | ICD-10-CM | POA: Diagnosis not present

## 2023-07-16 DIAGNOSIS — D631 Anemia in chronic kidney disease: Secondary | ICD-10-CM | POA: Diagnosis not present

## 2023-07-16 NOTE — Progress Notes (Signed)
 Anesthesia Chart Review: Paul Price  Case: [id number] Date/Time: 07/17/23 0915   Procedure: VIDEO BRONCHOSCOPY WITH ENDOBRONCHIAL NAVIGATION (Right)   Anesthesia type: General   Diagnosis: Lung nodule seen on imaging study [R91.1]   Pre-op diagnosis: lung nodule   [facility] / [facility]   Surgeons: Shelah Lamar RAMAN, MD       DISCUSSION: Patient is an 83 year old male scheduled for the above procedure. Recently identified small RUL pulmonary nodule. Differential includes benign versus metastatic lesion due to renal cell carcinoma history. Pulmonology recommended above procedure to further evaluate.   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   Last primary cardiology follow-up was on 07/10/23 with Percy Browning, NP for follow-up and preoperative evaluation for bronchoscopy. No symptoms concerning for angina. No longer on anticoagulation for PAF, although not completely clear but did have prior GI bleed. He is on ASA 81 mg daily. Normal PPM device function 05/15/23. She wrote, He experiences occasional transient chest pain near the pacemaker site without significant pain during activities. He remains active with  housework and yard work and is able to work for thirty minutes and then requires rest.  He does not have significant cardiac symptoms with exertion and is able to achieve > 4 METS activity.  Limitations include striae falls, dizziness, and balance issues. He is compliant with dialysis three times a week and feels well post-dialysis, engaging in daily activities with no significant concerns.  He reports BP has stabilized and he only takes midodrine  as needed. No preoperative ischemic testing recommended. She added, According to the Revised Cardiac Risk Index (RCRI), his Perioperative Risk of Major Cardiac Event is (%): 11 which is elevated due to history of IHD, elevated creatinine, possible stroke, but he is doing well from a cardiac perspective. His Functional Capacity in METs is: 5.93 according to the Duke Activity Status Index (DASI) and therefore further cardiac testing is not necessary prior to procedure.  Per office protocol, he may hold aspirin  5 to 7 days prior to procedure and should resume as soon as hemodynamically stable following the procedure.  Perioperative EP cardiac device recommendations: Device Information: Clinic EP Physician:  Elspeth Sage, MD  Device Type:  Pacemaker Manufacturer and Phone #:  Medtronic: [phone] Pacemaker Dependent?:  No. Date of Last Device Check:  05/15/2023     Normal Device Function?:  Yes.     Electrophysiologist's Recommendations: Have magnet available. Provide continuous ECG monitoring when magnet is used or reprogramming is to be performed.  Procedure should not interfere with device function.  No device programming or magnet placement needed.     Anesthesia team to evaluate on the day of surgery.     VS:  Wt Readings from Last 3 Encounters:  07/10/23 70.3 kg  07/04/23 69.3 kg  06/21/23 69.9 kg   BP Readings from Last 3 Encounters:  07/10/23 130/78  07/04/23  130/63  06/21/23 138/76   Pulse Readings from Last 3 Encounters:   07/10/23 74  07/04/23 78  06/21/23 71     PROVIDERS: Clarice Nottingham, MD  is PCP  Kate Bruckner, MD will be his is primary cardiologist. He has been seeing Burnard Ned, MD. Kennyth Chew, MD will be EP cardiologist. He has been seeing Fernande Standing, MD Shelah Charleston, MD is pulmonologist Carolee Beagle, MD is urologist Rayburn Pac, MD is nephrologist   LABS:  For day of surgery as indicated. Most recent results noted from [facility] included H/H 10.8/32.4 on 07/09/23.    IMAGES: Super D Chest CT 06/28/23: IMPRESSION: 1. RIGHT upper lobe pulmonary nodule is suspicious for primary bronchogenic carcinoma. 2. No evidence of metastatic adenopathy in the chest. 3. Mild bronchiectasis and scarring in the lower lobes. - Post CABG. LEFT-sided pacer.    EKG: 07/10/23: Ventricular-paced rhythm with occasional Premature ventricular complexes When compared with ECG of 31-Oct-2022 15:48, Premature ventricular complexes are now Present Confirmed by Percy Browning [phone]) on 07/10/2023 9:23:18 AM   CV: TEE 02/06/20: IMPRESSIONS   1. Global hypokinesis worse in the septum. Left ventricular ejection  fraction, by estimation, is 40 to 45%. The left ventricle has mildly  decreased function. The left ventricle demonstrates global hypokinesis.  Left ventricular diastolic function could  not be evaluated.   2. Right ventricular systolic function is mildly reduced. The right  ventricular size is normal.   3. Thrombus in the left atrial appendage measuring 1.5 x 0.6 cm. Left  atrial size was mildly dilated. A left atrial/left atrial appendage  thrombus was detected. The LAA emptying velocity was 47 cm/s.   4. Pacemaker wire.   5. The mitral valve is normal in structure. Mild mitral valve  regurgitation. No evidence of mitral stenosis.   6. Tricuspid valve regurgitation is mild to moderate.   7. The aortic valve is tricuspid. Aortic valve regurgitation is not  visualized. No  aortic stenosis is present.   8. There is mild (Grade II) atheroma plaque involving the descending  aorta.   9. The inferior vena cava is normal in size with greater than 50%  respiratory variability, suggesting right atrial pressure of 3 mmHg.  10. Evidence of atrial level shunting detected by color flow Doppler.  There is a small patent foramen ovale with predominantly left to right  shunting across the atrial septum.  - Conclusion(s)/Recommendation(s): Normal biventricular function without  evidence of hemodynamically significant valvular heart disease.  - (Comparison TTE 01/08/20: LVEF 40-45%, LV endocardial border not optimally defined to evaluate regional wall motion, elevated LVEDP, RVSP 41.2 mmHg, aortic root 39 mm; 12/21/16: EF 40-45%, diffuse hypokinesis, moderate TR, PA peak pressure 60 mmHg)     Myocardial Perfusion 05/04/16: The left ventricular ejection fraction is mildly decreased (45-54%). Nuclear stress EF is calculated at 47% but appears to be at 55%. There was no ST segment deviation noted during stress. There is a small defect of mild severity present in the mid anterior and apical anterior location. 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 No ischemia noted. This is a low risk study.     Cardiac cath 04/12/10: IMPRESSION: 1. Normal left ventricular function with a very minimal zone of mid     anterolateral hypocontractility. 2. A 10% to 20% narrowing in the ostium of the left main with 75%     stenosis in the mid left anterior descending after the bypassed     diagonal vessel and  septal perforating artery. 3. Normal circumflex system. 4. Normal dominant right coronary artery. 5. Patent vein graft supplying the diagonal vessel. 6. Atretic left internal mammary artery graft which had previously     supplied the left anterior descending. 7. Successful percutaneous coronary intervention to the left anterior     descending with the 75% to 80%  stenosis being reduced to 0% with     ultimate insertion of a 3.0- x 20-mm drug-eluting stent Promus     Element stent postdilated to 3.25 mm. 8. Angiomax/60 mg oral Effient/IC nitroglycerin.   Past Medical History:  Diagnosis Date   Arthritis    shoulders (09/07/2016)   Atrial fibrillation (HCC)    BPH (benign prostatic hyperplasia)    Chronic kidney disease (CKD), stage III (moderate) (HCC)    Coronary artery disease    DDD (degenerative disc disease), cervical    DVT (deep venous thrombosis) (HCC) 12/2013   LLE   GERD (gastroesophageal reflux disease)    Gout    High cholesterol    History of blood transfusion 09/06/2016   2 u PRBC   History of scarlet fever 1940s   History of stress test 06/2011   No significant ischemia, this is a low risk scan. Clinical correlation recommended Abnormal myocardial perfusion study.   Hx of echocardiogram 05/2009   EF 40-45%, he did have mild annular calcification with mild-to-moderate MR and mild TR as well as aortic valve sclerosis. Estimated RV systolic pressure was 21 mm.   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   Neck pain    not chronic (09/07/2016)   Obesity    OSA on CPAP    Presence of permanent cardiac pacemaker    RBBB    Renal insufficiency    Right renal mass    Spinal stenosis of lumbar region     Past Surgical History:  Procedure Laterality Date   A/V FISTULAGRAM N/A 01/17/2023   Procedure: A/V Fistulagram;  Surgeon: Melia Lynwood ORN, MD;  [facility];  Service: Cardiovascular;  Laterality: N/A;   APPENDECTOMY     AV FISTULA PLACEMENT Right 02/09/2020   Procedure: ARTERIOVENOUS (AV) FISTULA  CREATION RIGHT UPPER EXTREMITY;  Surgeon: Gretta Lonni PARAS, MD;  Location: [facility] OR;  Service: Vascular;  Laterality: Right;   BASCILIC VEIN TRANSPOSITION Right 03/30/2020   Procedure: RIGHT SECOND STAGE BASCILIC VEIN TRANSPOSITION;  Surgeon: Sheree Penne Lonni, MD;  Location: [facility] OR;  Service: Vascular;  Laterality: Right;   CARDIAC CATHETERIZATION  2009   he was found to have a atretic LIMA graft to his LAD and had a patent vein graft supplying his diagonal vessel. At that time, he had 70% narrowing in his LAD beyond a diagonal vessel which was initially treated medically.   COLONOSCOPY WITH PROPOFOL  Left 09/09/2016   Procedure: COLONOSCOPY WITH PROPOFOL ;  Surgeon: Burnette Fallow, MD;  Location: [facility] ENDOSCOPY;  Service: Endoscopy;  Laterality: Left;   CORONARY ANGIOPLASTY WITH STENT PLACEMENT  April 2012   LAD DES   CORONARY ARTERY BYPASS GRAFT  1992   with LIMA to the LAD and diagonal.   CYSTOSCOPY N/A 03/31/2019   Procedure: CYSTOSCOPY FLEXIBLE;  Surgeon: Carolee Sherwood JONETTA DOUGLAS, MD;  Location: [facility] ORS;  Service: Urology;  Laterality: N/A;   CYSTOSCOPY WITH URETHRAL DILATATION N/A 05/21/2019   Procedure: CYSTOSCOPY WITH URETHRAL BALLOON DILATATION;  Surgeon: Carolee Sherwood JONETTA DOUGLAS, MD;  Location: [facility] ORS;  Service: Urology;  Laterality: N/A;   ESOPHAGOGASTRODUODENOSCOPY N/A 11/16/2019   Procedure: ESOPHAGOGASTRODUODENOSCOPY (EGD);  Surgeon: Dianna Specking, MD;  Location: [facility] ENDOSCOPY;  Service: Endoscopy;  Laterality: N/A;   ESOPHAGOGASTRODUODENOSCOPY (EGD) WITH PROPOFOL  Left 09/08/2016   Procedure: ESOPHAGOGASTRODUODENOSCOPY (EGD) WITH PROPOFOL ;  Surgeon: Saintclair Jasper, MD;  Location: [facility] ENDOSCOPY;  Service: Gastroenterology;  Laterality: Left;   EXCHANGE OF A DIALYSIS CATHETER Right 02/09/2020   Procedure: EXCHANGE OF A DIALYSIS CATHETER;  Surgeon: Gretta Lonni PARAS, MD;  Location: [facility] OR;  Service: Vascular;  Laterality: Right;   GIVENS CAPSULE STUDY N/A 11/16/2019   Procedure: GIVENS CAPSULE STUDY;  Surgeon: Dianna Specking, MD;  Location: [facility] ENDOSCOPY;  Service: Endoscopy;  Laterality: N/A;   INGUINAL HERNIA REPAIR     don't remeimber which side   IR FLUORO GUIDE CV LINE RIGHT  02/05/2020   IR US  GUIDE VASC ACCESS RIGHT  02/05/2020    LAPAROSCOPIC NEPHRECTOMY Right 03/31/2019   Procedure: RIGHT  HAND ASSIST LAPAROSCOPIC NEPHRECTOMY;  Surgeon: Carolee Sherwood JONETTA DOUGLAS, MD;  Location: [facility] ORS;  Service: Urology;  Laterality: Right;   PACEMAKER IMPLANT N/A 12/25/2016   Procedure: PACEMAKER IMPLANT;  Surgeon: Fernande Elspeth BROCKS, MD;  Location: [facility] INVASIVE CV LAB;  Service: Cardiovascular;  Laterality: N/A;   PERIPHERAL VASCULAR BALLOON ANGIOPLASTY Right 01/17/2023   Procedure: PERIPHERAL VASCULAR BALLOON ANGIOPLASTY;  Surgeon: Melia Lynwood ORN, MD;  [facility] LAB;  Service: Cardiovascular;  Laterality: Right;  basilic inflow   TEE WITHOUT CARDIOVERSION N/A 02/06/2020   Procedure: TRANSESOPHAGEAL ECHOCARDIOGRAM (TEE);  Surgeon: Raford Riggs, MD;  Location: [facility] ENDOSCOPY;  Service: Cardiovascular;  Laterality: N/A;   TONSILLECTOMY      MEDICATIONS: No current facility-administered medications for this encounter.    acetaminophen  (TYLENOL ) 500 MG tablet   allopurinol  (ZYLOPRIM ) 300 MG tablet   allopurinol  (ZYLOPRIM ) 300 MG tablet   aspirin  EC 81 MG tablet   atorvastatin  (LIPITOR) 20 MG tablet   atorvastatin  (LIPITOR) 20 MG tablet   B Complex-C (B-COMPLEX WITH VITAMIN C) tablet   gabapentin  (NEURONTIN ) 100 MG capsule   gabapentin  (NEURONTIN ) 300 MG capsule   loperamide (IMODIUM A-D) 2 MG tablet   midodrine  (PROAMATINE ) 2.5 MG tablet   omeprazole (PRILOSEC OTC) 20 MG tablet   pantoprazole  (PROTONIX ) 40 MG tablet   pantoprazole  (PROTONIX ) 40 MG tablet    Isaiah Ruder, PA-C Surgical Short Stay/Anesthesiology [facility] Phone [phone] [facility] Phone [phone] 07/16/2023 10:57 AM

## 2023-07-16 NOTE — Progress Notes (Signed)
 SDW call  Patient was given pre-op instructions over the phone. Patient verbalized understanding of instructions provided.     PCP - Dr. Ryan Hives Cardiologist - Dr. Debby Sor Pulmonary:    PPM/ICD - Medtronic Device Orders - received and on the chart Rep Notified - na   Chest x-ray - DOS EKG -  07/10/2023 Stress Test - 2013 ECHO - 08/05/2020 Cardiac Cath - 2009  Sleep Study/sleep apnea/CPAP: OSA with nightly CPAP  Non-Diabetic   Blood Thinner Instructions: denies Aspirin  Instructions:Last dose 07/15/2023   ERAS Protcol - NPO   Anesthesia review: Yes. CAD, HTN, A-fib, DVT, OSA with CPAP   Patient denies shortness of breath, fever, cough and chest pain over the phone call  Your procedure is scheduled on Tuesday July 17, 2023  Report to [facility] Main Entrance A at 0645  A.M., then check in with the Admitting office.  Call this number if you have problems the morning of surgery:  [phone]   If you have any questions prior to your surgery date call [phone]: Open Monday-Friday 8am-4pm If you experience any cold or flu symptoms such as cough, fever, chills, shortness of breath, etc. between now and your scheduled surgery, please notify us  at the above number    Remember:  Do not eat or drink after midnight the night before your surgery  Take these medicines the morning of surgery with A SIP OF WATER:  Allopurinol , atorvastatin , gabapentin , prilosec, protonix   As needed: tylenol   As of today, STOP taking any Aleve, Naproxen, Ibuprofen, Motrin, Advil, Goody's, BC's, all herbal medications, fish oil, and all vitamins.

## 2023-07-16 NOTE — Anesthesia Preprocedure Evaluation (Signed)
 Anesthesia Evaluation  Patient identified by MRN, date of birth, ID band Patient awake    Reviewed: Allergy & Precautions, H&P , NPO status , Patient's Chart, lab work & pertinent test results  Airway Mallampati: II   Neck ROM: full    Dental   Pulmonary sleep apnea , former smoker   breath sounds clear to auscultation       Cardiovascular hypertension, + CAD, + Cardiac Stents, + CABG and +CHF  + dysrhythmias Atrial Fibrillation + pacemaker  Rhythm:regular Rate:Normal     Neuro/Psych  Neuromuscular disease    GI/Hepatic ,GERD  ,,  Endo/Other    Renal/GU ESRF and DialysisRenal disease     Musculoskeletal  (+) Arthritis ,    Abdominal   Peds  Hematology   Anesthesia Other Findings   Reproductive/Obstetrics                              Anesthesia Physical Anesthesia Plan  ASA: 4  Anesthesia Plan: General   Post-op Pain Management:    Induction: Intravenous  PONV Risk Score and Plan: 2 and Ondansetron , Dexamethasone  and Treatment may vary due to age or medical condition  Airway Management Planned: Oral ETT  Additional Equipment:   Intra-op Plan:   Post-operative Plan: Extubation in OR  Informed Consent: I have reviewed the patients History and Physical, chart, labs and discussed the procedure including the risks, benefits and alternatives for the proposed anesthesia with the patient or authorized representative who has indicated his/her understanding and acceptance.     Dental advisory given  Plan Discussed with: CRNA, Anesthesiologist and Surgeon  Anesthesia Plan Comments: (PAT note written 07/16/2023 by Allison Zelenak, PA-C.  )         Anesthesia Quick Evaluation

## 2023-07-17 ENCOUNTER — Ambulatory Visit (HOSPITAL_COMMUNITY)

## 2023-07-17 ENCOUNTER — Encounter (HOSPITAL_COMMUNITY)
Admission: RE | Disposition: A | Discharge status: Home / Self Care | Payer: Self-pay | Source: Home / Self Care | Attending: Emergency Medicine

## 2023-07-17 ENCOUNTER — Encounter (HOSPITAL_COMMUNITY): Payer: Self-pay | Admitting: Emergency Medicine

## 2023-07-17 ENCOUNTER — Ambulatory Visit (HOSPITAL_BASED_OUTPATIENT_CLINIC_OR_DEPARTMENT_OTHER): Admitting: Vascular Surgery

## 2023-07-17 ENCOUNTER — Ambulatory Visit (HOSPITAL_COMMUNITY)
Admission: RE | Admit: 2023-07-17 | Discharge: 2023-07-17 | Disposition: A | Discharge status: Home / Self Care | Attending: Emergency Medicine | Admitting: Emergency Medicine

## 2023-07-17 ENCOUNTER — Ambulatory Visit (HOSPITAL_COMMUNITY): Admitting: Vascular Surgery

## 2023-07-17 DIAGNOSIS — Z48813 Encounter for surgical aftercare following surgery on the respiratory system: Secondary | ICD-10-CM | POA: Diagnosis not present

## 2023-07-17 DIAGNOSIS — I5042 Chronic combined systolic (congestive) and diastolic (congestive) heart failure: Secondary | ICD-10-CM | POA: Insufficient documentation

## 2023-07-17 DIAGNOSIS — D631 Anemia in chronic kidney disease: Secondary | ICD-10-CM | POA: Diagnosis not present

## 2023-07-17 DIAGNOSIS — Z7982 Long term (current) use of aspirin: Secondary | ICD-10-CM | POA: Diagnosis not present

## 2023-07-17 DIAGNOSIS — N186 End stage renal disease: Secondary | ICD-10-CM

## 2023-07-17 DIAGNOSIS — Z955 Presence of coronary angioplasty implant and graft: Secondary | ICD-10-CM | POA: Diagnosis not present

## 2023-07-17 DIAGNOSIS — I739 Peripheral vascular disease, unspecified: Secondary | ICD-10-CM | POA: Diagnosis not present

## 2023-07-17 DIAGNOSIS — Z87891 Personal history of nicotine dependence: Secondary | ICD-10-CM | POA: Diagnosis not present

## 2023-07-17 DIAGNOSIS — M1990 Unspecified osteoarthritis, unspecified site: Secondary | ICD-10-CM | POA: Insufficient documentation

## 2023-07-17 DIAGNOSIS — Z8616 Personal history of COVID-19: Secondary | ICD-10-CM | POA: Diagnosis not present

## 2023-07-17 DIAGNOSIS — Z992 Dependence on renal dialysis: Secondary | ICD-10-CM | POA: Diagnosis not present

## 2023-07-17 DIAGNOSIS — Z905 Acquired absence of kidney: Secondary | ICD-10-CM | POA: Insufficient documentation

## 2023-07-17 DIAGNOSIS — I4891 Unspecified atrial fibrillation: Secondary | ICD-10-CM | POA: Diagnosis not present

## 2023-07-17 DIAGNOSIS — I132 Hypertensive heart and chronic kidney disease with heart failure and with stage 5 chronic kidney disease, or end stage renal disease: Secondary | ICD-10-CM | POA: Diagnosis not present

## 2023-07-17 DIAGNOSIS — R9389 Abnormal findings on diagnostic imaging of other specified body structures: Secondary | ICD-10-CM | POA: Diagnosis not present

## 2023-07-17 DIAGNOSIS — Z85528 Personal history of other malignant neoplasm of kidney: Secondary | ICD-10-CM | POA: Insufficient documentation

## 2023-07-17 DIAGNOSIS — K219 Gastro-esophageal reflux disease without esophagitis: Secondary | ICD-10-CM | POA: Insufficient documentation

## 2023-07-17 DIAGNOSIS — R918 Other nonspecific abnormal finding of lung field: Secondary | ICD-10-CM | POA: Diagnosis not present

## 2023-07-17 DIAGNOSIS — G4733 Obstructive sleep apnea (adult) (pediatric): Secondary | ICD-10-CM | POA: Insufficient documentation

## 2023-07-17 DIAGNOSIS — R911 Solitary pulmonary nodule: Secondary | ICD-10-CM | POA: Diagnosis not present

## 2023-07-17 DIAGNOSIS — Z950 Presence of cardiac pacemaker: Secondary | ICD-10-CM | POA: Insufficient documentation

## 2023-07-17 DIAGNOSIS — I4510 Unspecified right bundle-branch block: Secondary | ICD-10-CM | POA: Insufficient documentation

## 2023-07-17 DIAGNOSIS — Z951 Presence of aortocoronary bypass graft: Secondary | ICD-10-CM | POA: Diagnosis not present

## 2023-07-17 DIAGNOSIS — I2510 Atherosclerotic heart disease of native coronary artery without angina pectoris: Secondary | ICD-10-CM | POA: Diagnosis not present

## 2023-07-17 DIAGNOSIS — Z8249 Family history of ischemic heart disease and other diseases of the circulatory system: Secondary | ICD-10-CM | POA: Diagnosis not present

## 2023-07-17 DIAGNOSIS — E7800 Pure hypercholesterolemia, unspecified: Secondary | ICD-10-CM | POA: Insufficient documentation

## 2023-07-17 DIAGNOSIS — Z86718 Personal history of other venous thrombosis and embolism: Secondary | ICD-10-CM | POA: Insufficient documentation

## 2023-07-17 DIAGNOSIS — R0989 Other specified symptoms and signs involving the circulatory and respiratory systems: Secondary | ICD-10-CM | POA: Diagnosis not present

## 2023-07-17 DIAGNOSIS — C3411 Malignant neoplasm of upper lobe, right bronchus or lung: Secondary | ICD-10-CM | POA: Diagnosis not present

## 2023-07-17 DIAGNOSIS — I480 Paroxysmal atrial fibrillation: Secondary | ICD-10-CM | POA: Diagnosis not present

## 2023-07-17 HISTORY — PX: VIDEO BRONCHOSCOPY WITH ENDOBRONCHIAL NAVIGATION: SHX6175

## 2023-07-17 LAB — POCT I-STAT, CHEM 8
BUN: 40 mg/dL — ABNORMAL HIGH (ref 8–23)
Calcium, Ion: 1.18 mmol/L (ref 1.15–1.40)
Chloride: 98 mmol/L (ref 98–111)
Creatinine, Ser: 6.5 mg/dL — ABNORMAL HIGH (ref 0.61–1.24)
Glucose, Bld: 86 mg/dL (ref 70–99)
HCT: 38 % — ABNORMAL LOW (ref 39.0–52.0)
Hemoglobin: 12.9 g/dL — ABNORMAL LOW (ref 13.0–17.0)
Potassium: 4.7 mmol/L (ref 3.5–5.1)
Sodium: 140 mmol/L (ref 135–145)
TCO2: 30 mmol/L (ref 22–32)

## 2023-07-17 SURGERY — VIDEO BRONCHOSCOPY WITH ENDOBRONCHIAL NAVIGATION
Anesthesia: General | Laterality: Right

## 2023-07-17 MED ORDER — OXYCODONE HCL 5 MG/5ML PO SOLN: 5.0000 mg | Freq: Once | ORAL | Status: DC | PRN

## 2023-07-17 MED ORDER — ONDANSETRON HCL 4 MG/2ML IJ SOLN: 4.0000 mg | Freq: Four times a day (QID) | INTRAMUSCULAR | Status: DC | PRN

## 2023-07-17 MED ORDER — CHLORHEXIDINE GLUCONATE 0.12 % MT SOLN
OROMUCOSAL | Status: AC
  Administered 2023-07-17: 15 mL via OROMUCOSAL
  Filled 2023-07-17: qty 15

## 2023-07-17 MED ORDER — OXYCODONE HCL 5 MG PO TABS: 5.0000 mg | ORAL_TABLET | Freq: Once | ORAL | Status: DC | PRN

## 2023-07-17 MED ORDER — CHLORHEXIDINE GLUCONATE 0.12 % MT SOLN: 15.0000 mL | Freq: Once | OROMUCOSAL | Status: AC

## 2023-07-17 MED ORDER — SUGAMMADEX SODIUM 200 MG/2ML IV SOLN
INTRAVENOUS | Status: DC | PRN
  Administered 2023-07-17 (×2): 200 mg via INTRAVENOUS

## 2023-07-17 MED ORDER — ONDANSETRON HCL 4 MG/2ML IJ SOLN
INTRAMUSCULAR | Status: DC | PRN
  Administered 2023-07-17: 4 mg via INTRAVENOUS

## 2023-07-17 MED ORDER — LACTATED RINGERS IV SOLN: INTRAVENOUS | Status: DC

## 2023-07-17 MED ORDER — LIDOCAINE 2% (20 MG/ML) 5 ML SYRINGE
INTRAMUSCULAR | Status: DC | PRN
  Administered 2023-07-17: 70 mg via INTRAVENOUS

## 2023-07-17 MED ORDER — FENTANYL CITRATE (PF) 100 MCG/2ML IJ SOLN: 25.0000 ug | INTRAMUSCULAR | Status: DC | PRN

## 2023-07-17 MED ORDER — PHENYLEPHRINE HCL-NACL 20-0.9 MG/250ML-% IV SOLN
INTRAVENOUS | Status: DC | PRN
  Administered 2023-07-17: 50 ug/min via INTRAVENOUS

## 2023-07-17 MED ORDER — ROCURONIUM BROMIDE 10 MG/ML (PF) SYRINGE
PREFILLED_SYRINGE | INTRAVENOUS | Status: DC | PRN
  Administered 2023-07-17: 50 mg via INTRAVENOUS

## 2023-07-17 MED ORDER — PROPOFOL 500 MG/50ML IV EMUL
INTRAVENOUS | Status: DC | PRN
  Administered 2023-07-17: 150 ug/kg/min via INTRAVENOUS
  Administered 2023-07-17: 100 mg via INTRAVENOUS

## 2023-07-17 MED ORDER — SODIUM CHLORIDE 0.9 % IV SOLN: INTRAVENOUS | Status: DC

## 2023-07-17 MED ORDER — EPHEDRINE SULFATE-NACL 50-0.9 MG/10ML-% IV SOSY
PREFILLED_SYRINGE | INTRAVENOUS | Status: DC | PRN
  Administered 2023-07-17 (×2): 5 mg via INTRAVENOUS

## 2023-07-17 MED ORDER — DEXAMETHASONE SODIUM PHOSPHATE 10 MG/ML IJ SOLN
INTRAMUSCULAR | Status: DC | PRN
  Administered 2023-07-17: 10 mg via INTRAVENOUS

## 2023-07-17 SURGICAL SUPPLY — 37 items
ADAPTER BRONCHOSCOPE OLYMPUS (ADAPTER) ×1 IMPLANT
ADAPTER VALVE BIOPSY EBUS (MISCELLANEOUS) IMPLANT
BAG COUNTER SPONGE SURGICOUNT (BAG) ×1 IMPLANT
BRUSH CYTOL CELLEBRITY 1.5X140 (MISCELLANEOUS) ×1 IMPLANT
BRUSH SUPERTRAX BIOPSY (INSTRUMENTS) IMPLANT
BRUSH SUPERTRAX NDL-TIP CYTO (INSTRUMENTS) ×1 IMPLANT
CANISTER SUCTION 3000ML PPV (SUCTIONS) ×1 IMPLANT
CNTNR URN SCR LID CUP LEK RST (MISCELLANEOUS) ×1 IMPLANT
COVER BACK TABLE 60X90IN (DRAPES) ×1 IMPLANT
FILTER STRAW FLUID ASPIR (MISCELLANEOUS) IMPLANT
FORCEPS BIOP 1.5 SINGLE USE (MISCELLANEOUS) ×1 IMPLANT
FORCEPS BIOP SUPERTRX PREMAR (INSTRUMENTS) ×1 IMPLANT
GAUZE SPONGE 4X4 12PLY STRL (GAUZE/BANDAGES/DRESSINGS) ×1 IMPLANT
GLOVE BIO SURGEON STRL SZ7.5 (GLOVE) ×2 IMPLANT
GOWN STRL REUS W/ TWL LRG LVL3 (GOWN DISPOSABLE) ×2 IMPLANT
KIT CLEAN ENDO COMPLIANCE (KITS) ×1 IMPLANT
KIT LOCATABLE GUIDE (CANNULA) IMPLANT
KIT MARKER FIDUCIAL DELIVERY (KITS) IMPLANT
KIT TURNOVER KIT B (KITS) ×1 IMPLANT
MARKER SKIN DUAL TIP RULER LAB (MISCELLANEOUS) ×1 IMPLANT
NDL SUPERTRX PREMARK BIOPSY (NEEDLE) ×1 IMPLANT
NEEDLE SUPERTRX PREMARK BIOPSY (NEEDLE) ×1 IMPLANT
NS IRRIG 1000ML POUR BTL (IV SOLUTION) ×1 IMPLANT
OIL SILICONE PENTAX (PARTS (SERVICE/REPAIRS)) ×1 IMPLANT
PAD ARMBOARD POSITIONER FOAM (MISCELLANEOUS) ×2 IMPLANT
PATCHES PATIENT (LABEL) ×3 IMPLANT
SYR 20ML ECCENTRIC (SYRINGE) ×1 IMPLANT
SYR 20ML LL LF (SYRINGE) ×1 IMPLANT
SYR 50ML SLIP (SYRINGE) ×1 IMPLANT
TOWEL GREEN STERILE FF (TOWEL DISPOSABLE) ×1 IMPLANT
TRAP SPECIMEN MUCUS 40CC (MISCELLANEOUS) IMPLANT
TUBE CONNECTING 20X1/4 (TUBING) ×1 IMPLANT
UNDERPAD 30X36 HEAVY ABSORB (UNDERPADS AND DIAPERS) ×1 IMPLANT
VALVE BIOPSY SINGLE USE (MISCELLANEOUS) ×1 IMPLANT
VALVE SUCTION BRONCHIO DISP (MISCELLANEOUS) ×1 IMPLANT
WATER STERILE IRR 1000ML POUR (IV SOLUTION) ×1 IMPLANT
superlock fiducial marker IMPLANT

## 2023-07-17 NOTE — Interval H&P Note (Signed)
 History and Physical Interval Note:  07/17/2023 7:48 AM  Paul Price  has presented today for surgery, with the diagnosis of lung nodule.  The various methods of treatment have been discussed with the patient and family. After consideration of risks, benefits and other options for treatment, the patient has consented to  Procedure(s): VIDEO BRONCHOSCOPY WITH ENDOBRONCHIAL NAVIGATION (Right) as a surgical intervention.  The patient's history has been reviewed, patient examined, no change in status, stable for surgery.  I have reviewed the patient's chart and labs.  Questions were answered to the patient's satisfaction.     Lamar GORMAN Chris

## 2023-07-17 NOTE — Anesthesia Procedure Notes (Signed)
 Procedure Name: Intubation Date/Time: 07/17/2023 9:14 AM  Performed by: Elby Raelene SAUNDERS, CRNAPre-anesthesia Checklist: Patient identified, Emergency Drugs available, Suction available and Patient being monitored Patient Re-evaluated:Patient Re-evaluated prior to induction Oxygen Delivery Method: Circle System Utilized Preoxygenation: Pre-oxygenation with 100% oxygen Induction Type: IV induction Ventilation: Mask ventilation without difficulty Laryngoscope Size: Glidescope and 3 Grade View: Grade I Tube type: Oral Number of attempts: 1 Airway Equipment and Method: Stylet and Oral airway Placement Confirmation: ETT inserted through vocal cords under direct vision, positive ETCO2 and breath sounds checked- equal and bilateral Secured at: 22 cm Tube secured with: Tape Dental Injury: Teeth and Oropharynx as per pre-operative assessment

## 2023-07-17 NOTE — Progress Notes (Signed)
 Dr. Shelah looked at chest xray and said looked good, ready to be discharged when ready.

## 2023-07-17 NOTE — Op Note (Signed)
 Procedure Note  Patient: Paul Price  [employer] Cios mobile C-arm was utilized to identify and biopsy right upper lobe nodule.  Needle-in-lesion was confirmed using real-time Cios imaging, and images were uploaded to PACS.   Lamar Chris, MD, PhD 07/17/2023, 10:11 AM [facility] [phone] or if no answer before 7:00PM call [phone] For any issues after 7:00PM please call eLink [phone]

## 2023-07-17 NOTE — Discharge Instructions (Addendum)
 Flexible Bronchoscopy, Care After This sheet gives you information about how to care for yourself after your test. Your doctor may also give you more specific instructions. If you have problems or questions, contact your doctor. Follow these instructions at home: Eating and drinking When you are wide awake, your numbness is gone and your cough and gag reflexes have come back, you may: Start eating only soft foods. Slowly drink liquids. Six hours after the test, go back to your normal diet. Driving Do not drive for 24 hours if you were given a medicine to help you relax (sedative). Do not drive or use heavy machinery while taking prescription pain medicine. General instructions Take over-the-counter and prescription medicines only as told by your doctor. Return to your normal activities as told. Ask what activities are safe for you. Do not use any products that have nicotine or tobacco in them. This includes cigarettes and e-cigarettes. If you need help quitting, ask your doctor. Keep all follow-up visits as told by your doctor. This is important. It is very important if you had a tissue sample (biopsy) taken. Get help right away if: You have shortness of breath that gets worse. You get light-headed. You feel like you are going to pass out (faint). You have chest pain. You cough up: More than a little blood. More blood than before. Summary Do not use cigarettes. Do not use e-cigarettes. Seek care in the Emergency Department right away if you have chest pain or shortness of breath. Call or MyChart Message our office for any questions or problems at [phone].  Okay to restart aspirin  on 07/18/2023.   This information is not intended to replace advice given to you by your health care provider. Make sure you discuss any questions you have with your health care provider.

## 2023-07-17 NOTE — Op Note (Signed)
 Video Bronchoscopy with Robotic Assisted Bronchoscopic Navigation   Date of Operation: 07/17/2023   Pre-op Diagnosis: right upper lobe nodule  Post-op Diagnosis: same  Surgeon: Lamar Chris  Assistants: none  Anesthesia: General endotracheal anesthesia  Operation: Flexible video fiberoptic bronchoscopy with robotic assistance and biopsies.  Estimated Blood Loss: Minimal  Complications: None  Indications and History: Paul Price is a 83 y.o. male with history of ESRD, renal cell CA, found to have a posterior right upper lobe pulmonary nodule on surveillance imaging.  There was hypermetabolism on PET scan.  Recommendation made to achieve a tissue diagnosis via robotic assisted navigational bronchoscopy.  The risks, benefits, complications, treatment options and expected outcomes were discussed with the patient.  The possibilities of pneumothorax, pneumonia, reaction to medication, pulmonary aspiration, perforation of a viscus, bleeding, failure to diagnose a condition and creating a complication requiring transfusion or operation were discussed with the patient who freely signed the consent.    Description of Procedure: The patient was seen in the Preoperative Area, was examined and was deemed appropriate to proceed.  The patient was taken to [facility] Endoscopy room 3, identified as Nizar E Ojeda and the procedure verified as Flexible Video Fiberoptic Bronchoscopy.  A Time Out was held and the above information confirmed.   Prior to the date of the procedure a high-resolution CT scan of the chest was performed. Utilizing ION software program a virtual tracheobronchial tree was generated to allow the creation of distinct navigation pathways to the patient's parenchymal abnormalities. After being taken to the operating room general anesthesia was initiated and the patient  was orally intubated. The video fiberoptic bronchoscope was introduced via the endotracheal tube and a general inspection was  performed which showed normal right and left lung anatomy. Aspiration of the bilateral mainstems was completed to remove any remaining secretions. Robotic catheter inserted into patient's endotracheal tube.   Target #1 right upper lobe nodule: The distinct navigation pathways prepared prior to this procedure were then utilized to navigate to patient's lesion identified on CT scan. The robotic catheter was secured into place and the vision probe was withdrawn.  Lesion location was approximated using fluoroscopy.  Local registration and targeting was performed using Siem[employer] Cios mobile C-arm three-dimensional imaging. Under fluoroscopic guidance transbronchial needle biopsies and transbronchial forceps biopsies were performed to be sent for cytology and pathology.  Needle-in-lesion was confirmed using Cios mobile C-arm.   Under fluoroscopic guidance a single fiducial marker was placed adjacent to the nodule.   At the end of the procedure a general airway inspection was performed and there was no evidence of active bleeding. The bronchoscope was removed.  The patient tolerated the procedure well. There was no significant blood loss and there were no obvious complications. A post-procedural chest x-ray is pending.  Samples Target #1: 1. Transbronchial Wang needle biopsies from right upper lobe nodule 2. Transbronchial forceps biopsies from right upper lobe nodule    Plans:  The patient will be discharged from the PACU to home when recovered from anesthesia and after chest x-ray is reviewed. We will review the cytology, pathology and microbiology results with the patient when they become available. Outpatient followup will be with CANDIE Lites, NP.    Lamar Chris, MD, PhD 07/17/2023, 10:08 AM [facility] [phone] or if no answer before 7:00PM call [phone] For any issues after 7:00PM please call eLink [phone]

## 2023-07-17 NOTE — Transfer of Care (Signed)
 Immediate Anesthesia Transfer of Care Note  Patient: Paul Price  Procedure(s) Performed: VIDEO BRONCHOSCOPY WITH ENDOBRONCHIAL NAVIGATION (Right)  Patient Location: PACU  Anesthesia Type:General  Level of Consciousness: awake and patient cooperative  Airway & Oxygen Therapy: Patient Spontanous Breathing  Post-op Assessment: Report given to RN and Post -op Vital signs reviewed and stable  Post vital signs: Reviewed and stable  Last Vitals:  Vitals Value Taken Time  BP 123/53 07/17/23 10:17  Temp    Pulse 62 07/17/23 10:19  Resp 23 07/17/23 10:19  SpO2 94 % 07/17/23 10:19  Vitals shown include unfiled device data.  Last Pain:  Vitals:   07/17/23 0756  TempSrc: Oral         Complications: No notable events documented.

## 2023-07-18 ENCOUNTER — Other Ambulatory Visit (HOSPITAL_COMMUNITY): Payer: Self-pay

## 2023-07-18 ENCOUNTER — Other Ambulatory Visit: Payer: Self-pay

## 2023-07-18 ENCOUNTER — Other Ambulatory Visit (HOSPITAL_BASED_OUTPATIENT_CLINIC_OR_DEPARTMENT_OTHER): Payer: Self-pay

## 2023-07-18 DIAGNOSIS — D689 Coagulation defect, unspecified: Secondary | ICD-10-CM | POA: Diagnosis not present

## 2023-07-18 DIAGNOSIS — R197 Diarrhea, unspecified: Secondary | ICD-10-CM | POA: Diagnosis not present

## 2023-07-18 DIAGNOSIS — N2581 Secondary hyperparathyroidism of renal origin: Secondary | ICD-10-CM | POA: Diagnosis not present

## 2023-07-18 DIAGNOSIS — D631 Anemia in chronic kidney disease: Secondary | ICD-10-CM | POA: Diagnosis not present

## 2023-07-18 DIAGNOSIS — Z992 Dependence on renal dialysis: Secondary | ICD-10-CM | POA: Diagnosis not present

## 2023-07-18 DIAGNOSIS — D509 Iron deficiency anemia, unspecified: Secondary | ICD-10-CM | POA: Diagnosis not present

## 2023-07-18 DIAGNOSIS — E876 Hypokalemia: Secondary | ICD-10-CM | POA: Diagnosis not present

## 2023-07-18 DIAGNOSIS — N186 End stage renal disease: Secondary | ICD-10-CM | POA: Diagnosis not present

## 2023-07-18 NOTE — Anesthesia Postprocedure Evaluation (Signed)
 Anesthesia Post Note  Patient: Paul Price  Procedure(s) Performed: VIDEO BRONCHOSCOPY WITH ENDOBRONCHIAL NAVIGATION (Right)     Patient location during evaluation: PACU Anesthesia Type: General Level of consciousness: awake and alert Pain management: pain level controlled Vital Signs Assessment: post-procedure vital signs reviewed and stable Respiratory status: spontaneous breathing, nonlabored ventilation, respiratory function stable and patient connected to nasal cannula oxygen Cardiovascular status: blood pressure returned to baseline and stable Postop Assessment: no apparent nausea or vomiting Anesthetic complications: no   No notable events documented.  Last Vitals:  Vitals:   07/17/23 1045 07/17/23 1100  BP: (!) 119/53 (!) 124/56  Pulse: 61 62  Resp: 20 19  Temp:  36.8 C  SpO2: 96% 96%    Last Pain:  Vitals:   07/17/23 1100  TempSrc:   PainSc: Asleep                 [place] Flett S

## 2023-07-19 ENCOUNTER — Telehealth: Payer: Self-pay

## 2023-07-19 NOTE — Telephone Encounter (Signed)
 Copied from CRM [id number]. Topic: Clinical - Lab/Test Results >> Jul 17, 2023  4:57 PM Rilla B wrote: Reason for CRM: Patient received message from MyChart that he had results. Patient with daughter are unable to log in. Requesting a call to go over results.  Patient is calling for Bronch results. Has follow-up appointment with SG 7/17. Please advise

## 2023-07-20 DIAGNOSIS — D689 Coagulation defect, unspecified: Secondary | ICD-10-CM | POA: Diagnosis not present

## 2023-07-20 DIAGNOSIS — Z992 Dependence on renal dialysis: Secondary | ICD-10-CM | POA: Diagnosis not present

## 2023-07-20 DIAGNOSIS — N186 End stage renal disease: Secondary | ICD-10-CM | POA: Diagnosis not present

## 2023-07-20 DIAGNOSIS — N2581 Secondary hyperparathyroidism of renal origin: Secondary | ICD-10-CM | POA: Diagnosis not present

## 2023-07-20 DIAGNOSIS — R197 Diarrhea, unspecified: Secondary | ICD-10-CM | POA: Diagnosis not present

## 2023-07-20 DIAGNOSIS — E876 Hypokalemia: Secondary | ICD-10-CM | POA: Diagnosis not present

## 2023-07-20 DIAGNOSIS — D509 Iron deficiency anemia, unspecified: Secondary | ICD-10-CM | POA: Diagnosis not present

## 2023-07-20 DIAGNOSIS — D631 Anemia in chronic kidney disease: Secondary | ICD-10-CM | POA: Diagnosis not present

## 2023-07-20 NOTE — Telephone Encounter (Signed)
 Please let the patient know that his bronchoscopy cytology has not been read yet. Thanks,.

## 2023-07-20 NOTE — Telephone Encounter (Signed)
 Called and spoke with patient and advised of RB note. Pt verbalized understanding and will discuss results with SG at upcoming ov.

## 2023-07-23 ENCOUNTER — Encounter (HOSPITAL_COMMUNITY)
Admission: RE | Disposition: A | Discharge status: Home / Self Care | Payer: Self-pay | Source: Home / Self Care | Attending: Vascular Surgery

## 2023-07-23 ENCOUNTER — Other Ambulatory Visit: Payer: Self-pay

## 2023-07-23 ENCOUNTER — Ambulatory Visit (HOSPITAL_COMMUNITY)
Admission: RE | Admit: 2023-07-23 | Discharge: 2023-07-23 | Disposition: A | Discharge status: Home / Self Care | Attending: Vascular Surgery | Admitting: Vascular Surgery

## 2023-07-23 DIAGNOSIS — T82898A Other specified complication of vascular prosthetic devices, implants and grafts, initial encounter: Secondary | ICD-10-CM | POA: Diagnosis not present

## 2023-07-23 DIAGNOSIS — T82858A Stenosis of vascular prosthetic devices, implants and grafts, initial encounter: Secondary | ICD-10-CM | POA: Diagnosis not present

## 2023-07-23 DIAGNOSIS — Z992 Dependence on renal dialysis: Secondary | ICD-10-CM | POA: Diagnosis not present

## 2023-07-23 DIAGNOSIS — Y832 Surgical operation with anastomosis, bypass or graft as the cause of abnormal reaction of the patient, or of later complication, without mention of misadventure at the time of the procedure: Secondary | ICD-10-CM | POA: Diagnosis not present

## 2023-07-23 DIAGNOSIS — Z87891 Personal history of nicotine dependence: Secondary | ICD-10-CM | POA: Insufficient documentation

## 2023-07-23 DIAGNOSIS — R197 Diarrhea, unspecified: Secondary | ICD-10-CM | POA: Diagnosis not present

## 2023-07-23 DIAGNOSIS — D689 Coagulation defect, unspecified: Secondary | ICD-10-CM | POA: Diagnosis not present

## 2023-07-23 DIAGNOSIS — E876 Hypokalemia: Secondary | ICD-10-CM | POA: Diagnosis not present

## 2023-07-23 DIAGNOSIS — I120 Hypertensive chronic kidney disease with stage 5 chronic kidney disease or end stage renal disease: Secondary | ICD-10-CM | POA: Diagnosis not present

## 2023-07-23 DIAGNOSIS — D509 Iron deficiency anemia, unspecified: Secondary | ICD-10-CM | POA: Diagnosis not present

## 2023-07-23 DIAGNOSIS — N2581 Secondary hyperparathyroidism of renal origin: Secondary | ICD-10-CM | POA: Diagnosis not present

## 2023-07-23 DIAGNOSIS — N186 End stage renal disease: Secondary | ICD-10-CM

## 2023-07-23 DIAGNOSIS — D631 Anemia in chronic kidney disease: Secondary | ICD-10-CM | POA: Diagnosis not present

## 2023-07-23 HISTORY — PX: A/V SHUNT INTERVENTION: CATH118220

## 2023-07-23 LAB — CYTOLOGY - NON PAP

## 2023-07-23 SURGERY — A/V SHUNT INTERVENTION
Anesthesia: LOCAL | Site: Arm Upper | Laterality: Right

## 2023-07-23 MED ORDER — HEPARIN (PORCINE) IN NACL 1000-0.9 UT/500ML-% IV SOLN
INTRAVENOUS | Status: DC | PRN
Start: 2023-07-23 — End: 2023-07-23
  Administered 2023-07-23: 500 mL

## 2023-07-23 MED ORDER — HEPARIN SODIUM (PORCINE) 1000 UNIT/ML IJ SOLN
INTRAMUSCULAR | Status: AC
  Filled 2023-07-23: qty 10

## 2023-07-23 MED ORDER — HEPARIN SODIUM (PORCINE) 1000 UNIT/ML IJ SOLN
INTRAMUSCULAR | Status: DC | PRN
Start: 2023-07-23 — End: 2023-07-23
  Administered 2023-07-23: 5000 [IU] via INTRAVENOUS

## 2023-07-23 MED ORDER — LIDOCAINE HCL (PF) 1 % IJ SOLN
INTRAMUSCULAR | Status: DC | PRN
  Administered 2023-07-23: 2 mL via INTRADERMAL

## 2023-07-23 MED ORDER — LIDOCAINE HCL (PF) 1 % IJ SOLN
INTRAMUSCULAR | Status: AC
  Filled 2023-07-23: qty 30

## 2023-07-23 MED ORDER — IODIXANOL 320 MG/ML IV SOLN
INTRAVENOUS | Status: DC | PRN
Start: 2023-07-23 — End: 2023-07-23
  Administered 2023-07-23: 50 mL

## 2023-07-23 SURGICAL SUPPLY — 11 items
BALLOON MUSTANG 6X60X75 (BALLOONS) IMPLANT
BALLOON MUSTANG 8X20X75 (BALLOONS) IMPLANT
GLIDEWIRE ADV .035X260CM (WIRE) IMPLANT
KIT ENCORE 26 ADVANTAGE (KITS) IMPLANT
KIT PV (KITS) ×1 IMPLANT
SET MICROPUNCTURE 5F STIFF (MISCELLANEOUS) IMPLANT
SHEATH PINNACLE R/O II 6F 4CM (SHEATH) IMPLANT
SHEATH PROBE COVER 6X72 (BAG) IMPLANT
TRAY PV CATH (CUSTOM PROCEDURE TRAY) ×1 IMPLANT
TUBING CIL FLEX 10 FLL-RA (TUBING) IMPLANT
WIRE TORQFLEX AUST .018X40CM (WIRE) IMPLANT

## 2023-07-23 NOTE — H&P (Addendum)
 Hospital Consult    Reason for Consult: Fistula malfunction Requesting Physician: Nephrology MRN #:  [id number]  History of Present Illness: This is a 83 y.o. male with end-stage renal disease currently undergoing dialysis through a right arm brachiobasilic fistula.  The fistula has had issues in the past with stenosis, and required balloon venoplasty.  This last occurred roughly 6 months ago.  At his most recent dialysis, low flows were appreciated. On exam, Jamario was doing well.  He had no complaints other than weakness in the right arm.  Stated he was able to move his hand without major issues.  Symptoms were not new.  Past Medical History:  Diagnosis Date   Arthritis    shoulders (09/07/2016)   Atrial fibrillation (HCC)    BPH (benign prostatic hyperplasia)    Chronic kidney disease (CKD), stage III (moderate) (HCC)    Coronary artery disease    DDD (degenerative disc disease), cervical    DVT (deep venous thrombosis) (HCC) 12/2013   LLE   GERD (gastroesophageal reflux disease)    Gout    High cholesterol    History of blood transfusion 09/06/2016   2 u PRBC   History of scarlet fever 1940s   History of stress test 06/2011   No significant ischemia, this is a low risk scan. Clinical correlation recommended Abnormal myocardial perfusion study.   Hx of echocardiogram 05/2009   EF 40-45%, he did have mild annular calcification with mild-to-moderate MR and mild TR as well as aortic valve sclerosis. Estimated RV systolic pressure was 21 mm.   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   Neck pain    not chronic (09/07/2016)   Obesity    OSA on CPAP    Presence of permanent cardiac pacemaker    RBBB    Renal insufficiency    Right renal mass    Spinal stenosis of lumbar region     Past Surgical History:  Procedure Laterality Date   A/V FISTULAGRAM N/A 01/17/2023   Procedure: A/V Fistulagram;  Surgeon:  Melia Lynwood ORN, MD;  Location: [facility] INVASIVE CV LAB;  Service: Cardiovascular;  Laterality: N/A;   APPENDECTOMY     AV FISTULA PLACEMENT Right 02/09/2020   Procedure: ARTERIOVENOUS (AV) FISTULA  CREATION RIGHT UPPER EXTREMITY;  Surgeon: Gretta Lonni PARAS, MD;  Location: [facility] OR;  Service: Vascular;  Laterality: Right;   BASCILIC VEIN TRANSPOSITION Right 03/30/2020   Procedure: RIGHT SECOND STAGE BASCILIC VEIN TRANSPOSITION;  Surgeon: Sheree Penne Lonni, MD;  Location: [facility] OR;  Service: Vascular;  Laterality: Right;   CARDIAC CATHETERIZATION  2009   he was found to have a atretic LIMA graft to his LAD and had a patent vein graft supplying his diagonal vessel. At that time, he had 70% narrowing in his LAD beyond a diagonal vessel which was initially treated medically.   COLONOSCOPY WITH PROPOFOL  Left 09/09/2016   Procedure: COLONOSCOPY WITH PROPOFOL ;  Surgeon: Burnette Fallow, MD;  Location: [facility] ENDOSCOPY;  Service: Endoscopy;  Laterality: Left;   CORONARY ANGIOPLASTY WITH STENT PLACEMENT  April 2012   LAD DES   CORONARY ARTERY BYPASS GRAFT  1992   with LIMA to the LAD and diagonal.   CYSTOSCOPY N/A 03/31/2019   Procedure: CYSTOSCOPY FLEXIBLE;  Surgeon: Carolee Sherwood JONETTA DOUGLAS, MD;  Location: [facility] ORS;  Service: Urology;  Laterality: N/A;   CYSTOSCOPY WITH URETHRAL DILATATION N/A 05/21/2019   Procedure:  CYSTOSCOPY WITH URETHRAL BALLOON DILATATION;  Surgeon: Carolee Sherwood JONETTA DOUGLAS, MD;  Location: [facility] ORS;  Service: Urology;  Laterality: N/A;   ESOPHAGOGASTRODUODENOSCOPY N/A 11/16/2019   Procedure: ESOPHAGOGASTRODUODENOSCOPY (EGD);  Surgeon: Dianna Specking, MD;  Location: [facility] ENDOSCOPY;  Service: Endoscopy;  Laterality: N/A;   ESOPHAGOGASTRODUODENOSCOPY (EGD) WITH PROPOFOL  Left 09/08/2016   Procedure: ESOPHAGOGASTRODUODENOSCOPY (EGD) WITH PROPOFOL ;  Surgeon: Saintclair Jasper, MD;  Location: [facility] ENDOSCOPY;  Service: Gastroenterology;  Laterality: Left;   EXCHANGE OF A DIALYSIS CATHETER Right 02/09/2020   Procedure:  EXCHANGE OF A DIALYSIS CATHETER;  Surgeon: Gretta Lonni PARAS, MD;  Location: [facility] OR;  Service: Vascular;  Laterality: Right;   GIVENS CAPSULE STUDY N/A 11/16/2019   Procedure: GIVENS CAPSULE STUDY;  Surgeon: Dianna Specking, MD;  Location: [facility] ENDOSCOPY;  Service: Endoscopy;  Laterality: N/A;   INGUINAL HERNIA REPAIR     don't remeimber which side   IR FLUORO GUIDE CV LINE RIGHT  02/05/2020   IR US  GUIDE VASC ACCESS RIGHT  02/05/2020   LAPAROSCOPIC NEPHRECTOMY Right 03/31/2019   Procedure: RIGHT  HAND ASSIST LAPAROSCOPIC NEPHRECTOMY;  Surgeon: Carolee Sherwood JONETTA DOUGLAS, MD;  Location: [facility] ORS;  Service: Urology;  Laterality: Right;   PACEMAKER IMPLANT N/A 12/25/2016   Procedure: PACEMAKER IMPLANT;  Surgeon: Fernande Elspeth BROCKS, MD;  Location[facility] INVASIVE CV LAB;  Service: Cardiovascular;  Laterality: N/A;   PERIPHERAL VASCULAR BALLOON ANGIOPLASTY Right 01/17/2023   Procedure: PERIPHERAL VASCULAR BALLOON ANGIOPLASTY;  Surgeon: Melia Lynwood ORN, MD;  Location: [facility] INVASIVE CV LAB;  Service: Cardiovascular;  Laterality: Right;  basilic inflow   TEE WITHOUT CARDIOVERSION N/A 02/06/2020   Procedure: TRANSESOPHAGEAL ECHOCARDIOGRAM (TEE);  Surgeon: Raford Riggs, MD;  Location: [facility] ENDOSCOPY;  Service: Cardiovascular;  Laterality: N/A;   TONSILLECTOMY     VIDEO BRONCHOSCOPY WITH ENDOBRONCHIAL NAVIGATION Right 07/17/2023   Procedure: VIDEO BRONCHOSCOPY WITH ENDOBRONCHIAL NAVIGATION;  Surgeon: Shelah Lamar RAMAN, MD;  Location: [facility] ENDOSCOPY;  Service: Pulmonary;  Laterality: Right;    Allergies  Allergen Reactions   Gabapentin  Other (See Comments)    Dizziness Loss of mental capacity   Simvastatin Other (See Comments)    Abdominal cramps   Nitrostat [Nitroglycerin] Other (See Comments)    Causes blood pressure to bottom out    Prior to Admission medications   Medication Sig Start Date End Date Taking? Authorizing Provider  acetaminophen  (TYLENOL ) 500 MG tablet Take 1,500-2,000 mg by mouth daily as needed for  mild pain (pain score 1-3), moderate pain (pain score 4-6) or headache.    [provider]  allopurinol  (ZYLOPRIM ) 300 MG tablet Take 300 mg by mouth daily.    [provider]  allopurinol  (ZYLOPRIM ) 300 MG tablet Take 1 tablet (300 mg total) by mouth daily. 01/05/23   Clarice Nottingham, MD  aspirin  EC 81 MG tablet Take 81 mg by mouth daily. Swallow whole.    [provider]  atorvastatin  (LIPITOR) 20 MG tablet TAKE 1 TABLET BY MOUTH DAILY 01/05/23   Burnard Debby LABOR, MD  atorvastatin  (LIPITOR) 20 MG tablet Take 1 tablet (20 mg total) by mouth daily. 01/05/23     B Complex-C (B-COMPLEX WITH VITAMIN C) tablet Take 1 tablet by mouth daily.    [provider]  gabapentin  (NEURONTIN ) 100 MG capsule Take 1 capsule (100 mg total) by mouth at bedtime. 12/26/22     gabapentin  (NEURONTIN ) 300 MG capsule Take 1 capsule (300 mg total) by mouth at bedtime. 11/22/22     loperamide (IMODIUM A-D) 2 MG tablet Take  2 mg by mouth as needed for diarrhea or loose stools. Patient not taking: Reported on 07/10/2023    [provider]  midodrine  (PROAMATINE ) 2.5 MG tablet TAKE 1 TABLET BY MOUTH IMMEDIATELY AFTER DIALYSIS THEN ANOTHER TABLET 4 HOURS LATER Patient not taking: Reported on 07/16/2023 10/05/22   Fernande Elspeth BROCKS, MD  omeprazole (PRILOSEC OTC) 20 MG tablet Take 20 mg by mouth daily.    [provider]  pantoprazole  (PROTONIX ) 40 MG tablet Take 40 mg by mouth daily.    [provider]  pantoprazole  (PROTONIX ) 40 MG tablet Take 1 tablet (40 mg total) by mouth daily. 11/14/22       Social History   Socioeconomic History   Marital status: Married    Spouse name: Not on file   Number of children: 3   Years of education: Not on file   Highest education level: Not on file  Occupational History   Not on file  Tobacco Use   Smoking status: Former    Current packs/day: 1.00    Average packs/day: 1 pack/day for 30.0 years (30.0 ttl pk-yrs)    Types:  Cigarettes   Smokeless tobacco: Never   Tobacco comments:    quit ~ 1985  Vaping Use   Vaping status: Never Used  Substance and Sexual Activity   Alcohol use: Not Currently    Alcohol/week: 0.0 standard drinks of alcohol    Comment: rare beer   Drug use: No   Sexual activity: Not Currently  Other Topics Concern   Not on file  Social History Narrative   Not on file   Social Drivers of Health   Financial Resource Strain: Not on file  Food Insecurity: Not on file  Transportation Needs: Not on file  Physical Activity: Not on file  Stress: Not on file  Social Connections: Not on file  Intimate Partner Violence: Not on file   Family History  Problem Relation Age of Onset   Heart disease Father     ROS: Otherwise negative unless mentioned in HPI  Physical Examination  Vitals:   07/23/23 0829 07/23/23 0840  BP: (!) 161/75   Pulse: 73 61  Resp: 12 14  Temp: (!) 97.5 F (36.4 C)   SpO2: 100%    There is no height or weight on file to calculate BMI.  General:  WDWN in NAD Gait: Not observed HENT: WNL, normocephalic Pulmonary: normal  Cardiac: regular Abdomen:  soft, NT/ND, no masses Skin: without rashes Vascular Exam/Pulses: Light thrill in the fistula Extremities: without ischemic changes Musculoskeletal: no muscle wasting or atrophy  Neurologic: A&O X 3;  No focal weakness or paresthesias are detected; speech is fluent/normal Psychiatric:  The pt has Normal affect. Lymph:  Unremarkable  CBC    Component Value Date/Time   WBC 9.4 02/12/2020 0149   RBC 3.26 (L) 02/12/2020 0149   HGB 12.9 (L) 07/17/2023 0735   HGB 7.7 (L) 10/20/2019 0924   HCT 38.0 (L) 07/17/2023 0735   HCT 39.9 10/01/2018 1130   PLT 141 (L) 02/12/2020 0149   PLT 146 (L) 10/20/2019 0924   MCV 85.6 02/12/2020 0149   MCH 26.7 02/12/2020 0149   MCHC 31.2 02/12/2020 0149   RDW 22.4 (H) 02/12/2020 0149   LYMPHSABS 0.7 02/01/2020 1618   MONOABS 1.0 02/01/2020 1618   EOSABS 0.1 02/01/2020  1618   BASOSABS 0.0 02/01/2020 1618    BMET    Component Value Date/Time   NA 140 07/17/2023 0735  NA 146 (H) 11/22/2016 1057   K 4.7 07/17/2023 0735   CL 98 07/17/2023 0735   CO2 22 02/12/2020 0149   GLUCOSE 86 07/17/2023 0735   BUN 40 (H) 07/17/2023 0735   BUN 44 (H) 11/22/2016 1057   CREATININE 6.50 (H) 07/17/2023 0735   CREATININE 3.18 (HH) 10/20/2019 0924   CREATININE 1.79 (H) 10/27/2015 0803   CALCIUM  7.6 (L) 02/12/2020 0149   GFRNONAA 16 (L) 02/12/2020 0149   GFRNONAA 17 (L) 10/20/2019 0924   GFRNONAA 36 (L) 10/27/2015 0803   GFRAA 20 (L) 08/12/2019 0959   GFRAA 41 (L) 10/27/2015 0803    COAGS: Lab Results  Component Value Date   INR 1.6 (H) 02/09/2020   INR 2.6 (H) 11/14/2019   INR 1.8 (H) 03/21/2019     ASSESSMENT/PLAN: This is a 83 y.o. male with right arm brachiobasilic fistula malfunction, low flows with prior history of stenosis.  After discussing the risks and benefits of right arm fistulagram, with possible intervention in effort to improve flow for continued dialysis use, [place] elected to proceed.   Fonda FORBES Rim MD [facility] [phone] 07/23/2023  8:50 AM

## 2023-07-23 NOTE — Op Note (Signed)
    [name] MRN: [id number] DOB: Apr 17, 1940 Sex: male  07/23/2023 Pre-operative Diagnosis: Right arm brachiobasilic fistula malfunction Post-operative diagnosis:  Same Surgeon:  Fonda FORBES Rim, MD Procedure Performed: 1.  Ultrasound-guided micropuncture access of the right brachiobasilic fistula 2.  Fistulogram 3.  6 x 60 balloon venoplasty immediately distal to arterial anastomosis 4.  8 x 20 balloon venoplasty mid fistula 5.  Contrast volume 50 mL 6.  Access managed using Monocryl suture  Indications: Patient is an 83 year old male with end-stage renal disease currently being dialyzed through a right sided brachiobasilic fistula.  The fistula has been running poorly, unable to complete dialysis.  After discussing the risks and benefits of right arm fistulogram in an effort to define and improve perfusion, [name] elected to proceed.  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  The patient was then placed supine on the table and prepped and draped in the usual sterile fashion.  A time out was called.  Ultrasound was used to evaluate the right brachiobasilic fistula.  This was accessed in retrograde fashion using an ultrasound-guided micropuncture needle.  The needle was then exchanged for a micropuncture sheath and fistulogram followed.   See results above.  I elected to intervene on the 2 areas of severe stenosis.  The patient was heparinized and a 6 French sheath placed.  A Glidewire advantage was used to navigate into the brachial artery.  Next, a 6 x 60 mm balloon was brought onto the field and expanded from the arterial anastomosis into the  fistula.  This was inflated for 2 minutes with follow-up angiogram demonstrating resolution of flow-limiting stenosis.  I then moved to the sheath back, and inflated the 6 x 60 mm balloon between the 2 aneurysmal portions of the fistula.  While this improved the stenosis, I thought that the vein could handle an 8 mm balloon, therefore an 8 x 20 mm balloon was brought to the field and expanded along the focal lesion.  Follow-up angiography demonstrated excellent result with resolution of flow-limiting stenosis throughout the entirety of the fistula.  The fistula remains amenable to future endovascular intervention The fistula can continue to be used for access    Fonda FORBES Rim MD [facility] [place] Office: [phone]

## 2023-07-24 ENCOUNTER — Encounter (HOSPITAL_COMMUNITY): Payer: Self-pay | Admitting: Vascular Surgery

## 2023-07-25 DIAGNOSIS — R197 Diarrhea, unspecified: Secondary | ICD-10-CM | POA: Diagnosis not present

## 2023-07-25 DIAGNOSIS — D689 Coagulation defect, unspecified: Secondary | ICD-10-CM | POA: Diagnosis not present

## 2023-07-25 DIAGNOSIS — Z992 Dependence on renal dialysis: Secondary | ICD-10-CM | POA: Diagnosis not present

## 2023-07-25 DIAGNOSIS — N2581 Secondary hyperparathyroidism of renal origin: Secondary | ICD-10-CM | POA: Diagnosis not present

## 2023-07-25 DIAGNOSIS — N186 End stage renal disease: Secondary | ICD-10-CM | POA: Diagnosis not present

## 2023-07-25 DIAGNOSIS — D631 Anemia in chronic kidney disease: Secondary | ICD-10-CM | POA: Diagnosis not present

## 2023-07-25 DIAGNOSIS — E876 Hypokalemia: Secondary | ICD-10-CM | POA: Diagnosis not present

## 2023-07-25 DIAGNOSIS — D509 Iron deficiency anemia, unspecified: Secondary | ICD-10-CM | POA: Diagnosis not present

## 2023-07-26 ENCOUNTER — Encounter: Payer: Self-pay | Admitting: Acute Care

## 2023-07-26 ENCOUNTER — Ambulatory Visit (INDEPENDENT_AMBULATORY_CARE_PROVIDER_SITE_OTHER): Admitting: Acute Care

## 2023-07-26 VITALS — BP 134/64 | HR 74 | Temp 97.7°F | Ht 66.0 in | Wt 154.0 lb

## 2023-07-26 DIAGNOSIS — Z9889 Other specified postprocedural states: Secondary | ICD-10-CM

## 2023-07-26 DIAGNOSIS — Z87891 Personal history of nicotine dependence: Secondary | ICD-10-CM

## 2023-07-26 DIAGNOSIS — Z85528 Personal history of other malignant neoplasm of kidney: Secondary | ICD-10-CM

## 2023-07-26 DIAGNOSIS — C3490 Malignant neoplasm of unspecified part of unspecified bronchus or lung: Secondary | ICD-10-CM | POA: Diagnosis not present

## 2023-07-26 NOTE — Progress Notes (Signed)
 History of Present Illness Paul Price is a 83 y.o. male with history of renal cancer , referred for a new lung nodule found on surveillance imaging. He is followed by Dr. Shelah.  He was referred by his renal  provider  after a suspicious nodule was found during imaging for his kidney.  PMH Includes  former smoker (30 pack years), history of renal cell carcinoma (resection ?), hypertension, coronary artery disease/CABG, A-fib with pacer, end-stage renal disease on hemodialysis, LLE DVT, peripheral vascular disease with right hand steal in the setting of right upper extremity fistula placement. OSA on CPAP.    07/26/2023 Pt. Was referred for suspicious pulmonary nodule  found during imaging by his nephrologist. He underwent PET scan to further evaluate the nodule. PET scan showed the right upper lobe nodule of concern was hypermetabolic on PET and was concerning for primary bronchogenic carcinoma.There was no evidence of metastatic disease.  Plan was for bronchoscopy with biopsies to get tissue sampling. Pt underwent robotic assisted navigational bronchoscopy with biopsies on 07/17/2023. He states he has done well after the procedure. Post-procedure, he is experiencing a mild sore throat but no bleeding, fever, or infection.He denies any adverse reaction to anesthesia.   We have reviewed the results of the biopsies. 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 Due to the patient's age and his dialysis dependence and heart disease  he is not interested in surgery. I have referred to radiation oncology as an option for treatment. Patient has agreed to go to the referral. He will decide after speaking with RAD ONC  if he wants to pursue treatment. I told him treatment is different today from what he may have heard in the past, and encouraged him to have an open mind when her goes for consult. He has great family support.   Test Results: Cytology 07/17/2023 FINAL MICROSCOPIC DIAGNOSIS:    A. LUNG, RUL, FINE NEEDLE ASPIRATION  BIOPSY:  - Adenocarcinoma, see comment      Latest Ref Rng & Units 07/17/2023    7:35 AM 03/30/2020    8:02 AM 02/12/2020    1:49 AM  CBC  WBC 4.0 - 10.5 K/uL   9.4   Hemoglobin 13.0 - 17.0 g/dL 87.0  7.8  8.7   Hematocrit 39.0 - 52.0 % 38.0  23.0  27.9   Platelets 150 - 400 K/uL   141        Latest Ref Rng & Units 07/17/2023    7:35 AM 03/30/2020    8:02 AM 02/12/2020    1:49 AM  BMP  Glucose 70 - 99 mg/dL 86  886  93   BUN 8 - 23 mg/dL 40  22  26   Creatinine 0.61 - 1.24 mg/dL 3.49  6.89  6.41   Sodium 135 - 145 mmol/L 140  139  141   Potassium 3.5 - 5.1 mmol/L 4.7  3.2  4.2   Chloride 98 - 111 mmol/L 98  93  107   CO2 22 - 32 mmol/L   22   Calcium  8.9 - 10.3 mg/dL   7.6     BNP    Component Value Date/Time   BNP 613.2 (H) 01/26/2020 0350    ProBNP No results found for: PROBNP  PFT No results found for: FEV1PRE, FEV1POST, FVCPRE, FVCPOST, TLC, DLCOUNC, PREFEV1FVCRT, PSTFEV1FVCRT  PERIPHERAL VASCULAR CATHETERIZATION Result Date: 07/24/2023 Images from the original result were not included.   [name]  MRN: [id number]        DOB: 05/03/1940            Sex: male  07/23/2023 Pre-operative Diagnosis: Right arm brachiobasilic fistula malfunction Post-operative diagnosis:  Same Surgeon:  Paul FORBES Rim, MD Procedure Performed: 1.  Ultrasound-guided micropuncture access of the right brachiobasilic fistula 2.  Fistulogram 3.  6 x 60 balloon venoplasty immediately distal to arterial anastomosis 4.  8 x 20 balloon venoplasty mid fistula 5.  Contrast volume 50 mL 6.  Access managed using Monocryl suture  Indications: Patient is an 83 year old male with end-stage renal disease currently being dialyzed through a right sided brachiobasilic fistula.  The fistula has been running poorly, unable to complete dialysis.  After discussing the risks and benefits of right arm fistulogram in an effort to define and  improve perfusion, Paul Price elected to proceed.  Findings: Widely patent arterial anastomosis immediately followed by greater than 80% stenosis spanning 4 cm To aneurysm appreciated mid fistula, in between these aneurysms there was focal 80% stenosis Widely patent axillary vein, subclavian vein, brachiocephalic vein, superior vena cava             Procedure:  The patient was identified in the holding area and taken to room 8.  The patient was then placed supine on the table and prepped and draped in the usual sterile fashion.  A time out was called.  Ultrasound was used to evaluate the right brachiobasilic fistula.  This was accessed in retrograde fashion using an ultrasound-guided micropuncture needle.  The needle was then exchanged for a micropuncture sheath and fistulogram followed.  See results above.  I elected to intervene on the 2 areas of severe stenosis.  The patient was heparinized and a 6 French sheath placed.  A Glidewire advantage was used to navigate into the brachial artery.  Next, a 6 x 60 mm balloon was brought onto the field and expanded from the arterial anastomosis into the fistula.  This was inflated for 2 minutes with follow-up angiogram demonstrating resolution of flow-limiting stenosis.  I then moved to the sheath back, and inflated the 6 x 60 mm balloon between the 2 aneurysmal portions of the fistula.  While this improved the stenosis, I thought that the vein could handle an 8 mm balloon, therefore an 8 x 20 mm balloon was brought to the field and expanded along the focal lesion.  Follow-up angiography demonstrated excellent result with resolution of flow-limiting stenosis throughout the entirety of the fistula.  The fistula remains amenable to future endovascular intervention The fistula can continue to be used for access    Paul FORBES Rim MD [facility] Office: [phone]    DG Chest Port 1 View Result Date: 07/17/2023 CLINICAL DATA:  [id number] S/P  bronchoscopy with biopsy [id number]. EXAM: PORTABLE CHEST 1 VIEW COMPARISON:  05/27/2020. FINDINGS: Low lung volume. Note is made of elevated right hemidiaphragm. There are probable atelectatic changes at the lung bases, left more than right. Bilateral lung fields are otherwise clear. No acute consolidation or lung collapse. Bilateral costophrenic angles are clear. Stable cardio-mediastinal silhouette. There are surgical staples along the heart border and sternotomy wires, status post CABG (coronary artery bypass graft). There is a left sided 2-lead pacemaker. No acute osseous abnormalities. The soft tissues are within normal limits. IMPRESSION: Low lung volume. Probable atelectatic changes at the lung bases, left more than right. No acute cardiopulmonary abnormality. Electronically Signed   By: Ree Molt M.D.   On:  07/17/2023 11:50   DG C-ARM BRONCHOSCOPY Result Date: 07/17/2023 C-ARM BRONCHOSCOPY: Fluoroscopy was utilized by the requesting physician.  No radiographic interpretation.   NM PET SUPER D CT Result Date: 07/02/2023 CLINICAL DATA:  Suspicious pulmonary nodule. Concern for primary bronchogenic carcinoma. * Tracking Code: BO * EXAM: CT CHEST WITHOUT CONTRAST TECHNIQUE: Multidetector CT imaging of the chest was performed using thin slice collimation for electromagnetic bronchoscopy planning purposes, without intravenous contrast. RADIATION DOSE REDUCTION: This exam was performed according to the departmental dose-optimization program which includes automated exposure control, adjustment of the mA and/or kV according to patient size and/or use of iterative reconstruction technique. COMPARISON:  PET-CT scan same day FINDINGS: Cardiovascular: Post CABG.  LEFT-sided pacer. Mediastinum/Nodes: No axillary or supraclavicular adenopathy. No mediastinal or hilar adenopathy. No pericardial fluid. Esophagus normal. Lungs/Pleura: RIGHT upper lobe pulmonary nodule measures 13 mm 9 mm (image 64/series 4. This  nodule touches the pleural surface. The nodule was hypermetabolic on comparison FDG PET scan. No additional pulmonary nodules. There is mild bronchiectasis in lower lobes. Mild basilar scarring. Upper Abdomen: Limited view of the liver, kidneys, pancreas are unremarkable. Normal adrenal glands. Musculoskeletal: No aggressive osseous lesion. IMPRESSION: 1. RIGHT upper lobe pulmonary nodule is suspicious for primary bronchogenic carcinoma. 2. No evidence of metastatic adenopathy in the chest. 3. Mild bronchiectasis and scarring in the lower lobes. Electronically Signed   By: Jackquline Boxer M.D.   On: 07/02/2023 16:37   NM PET Image Initial (PI) Skull Base To Thigh Result Date: 07/02/2023 CLINICAL DATA:  Initial treatment strategy for RIGHT upper lobe pulmonary nodule. EXAM: NUCLEAR MEDICINE PET SKULL BASE TO THIGH TECHNIQUE: 7.7 mCi F-18 FDG was injected intravenously. Full-ring PET imaging was performed from the skull base to thigh after the radiotracer. CT data was obtained and used for attenuation correction and anatomic localization. Fasting blood glucose: 95 mg/dl COMPARISON:  CT 97/79/7974 FINDINGS: NECK: No hypermetabolic lymph nodes in the neck. Incidental CT findings: None. CHEST: Nodule of concern along the posterior aspect of the RIGHT upper lobe measures 12 mm (57/4) and has associated hypermetabolic activity with SUV max equal 3.0 on image 57. The small nodule touches the pleural surface. No additional hypermetabolic pulmonary nodules. No hypermetabolic mediastinal lymph nodes. Incidental CT findings: Post CABG. ABDOMEN/PELVIS: No abnormal hypermetabolic activity within the liver, pancreas, adrenal glands, or spleen. No hypermetabolic lymph nodes in the abdomen or pelvis. Incidental CT findings: Gallstones noted. Solitary LEFT kidney. The solitary kidney demonstrates cortical thinning. Simple cyst of the LEFT kidney. SKELETON: No focal hypermetabolic activity to suggest skeletal metastasis.  Incidental CT findings: None. IMPRESSION: 1. Hypermetabolic RIGHT upper lobe pulmonary nodule concerning for primary lung cancer 2. No evidence of metastatic adenopathy or distant metastatic disease. 3. Solitary LEFT kidney. Electronically Signed   By: Jackquline Boxer M.D.   On: 07/02/2023 16:32     Past medical hx Past Medical History:  Diagnosis Date   Arthritis    shoulders (09/07/2016)   Atrial fibrillation (HCC)    BPH (benign prostatic hyperplasia)    Chronic kidney disease (CKD), stage III (moderate) (HCC)    Coronary artery disease    DDD (degenerative disc disease), cervical    DVT (deep venous thrombosis) (HCC) 12/2013   LLE   GERD (gastroesophageal reflux disease)    Gout    High cholesterol    History of blood transfusion 09/06/2016   2 u PRBC   History of scarlet fever 1940s   History of stress test 06/2011   No  significant ischemia, this is a low risk scan. Clinical correlation recommended Abnormal myocardial perfusion study.   Hx of echocardiogram 05/2009   EF 40-45%, he did have mild annular calcification with mild-to-moderate MR and mild TR as well as aortic valve sclerosis. Estimated RV systolic pressure was 21 mm.   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   Neck pain    not chronic (09/07/2016)   Obesity    OSA on CPAP    Presence of permanent cardiac pacemaker    RBBB    Renal insufficiency    Right renal mass    Spinal stenosis of lumbar region      Social History   Tobacco Use   Smoking status: Former    Current packs/day: 0.00    Average packs/day: 1 pack/day for 30.0 years (30.0 ttl pk-yrs)    Types: Cigarettes    Quit date: 6    Years since quitting: 40.5   Smokeless tobacco: Never   Tobacco comments:    quit ~ 1985  Vaping Use   Vaping status: Never Used  Substance Use Topics   Alcohol use: Not Currently    Alcohol/week: 0.0 standard drinks of alcohol    Comment:  rare beer   Drug use: No    Mr.Lacks reports that he quit smoking about 40 years ago. His smoking use included cigarettes. He has a 30 pack-year smoking history. He has never used smokeless tobacco. He reports that he does not currently use alcohol. He reports that he does not use drugs.  Tobacco Cessation: Counseling given: Not Answered Tobacco comments: quit ~ 1985 Former smoker , quit 1985 with a 30 pack year history  Past surgical hx, Family hx, Social hx all reviewed.  Current Outpatient Medications on File Prior to Visit  Medication Sig   acetaminophen  (TYLENOL ) 500 MG tablet Take 1,500-2,000 mg by mouth daily as needed for mild pain (pain score 1-3), moderate pain (pain score 4-6) or headache.   allopurinol  (ZYLOPRIM ) 300 MG tablet Take 300 mg by mouth daily.   aspirin  EC 81 MG tablet Take 81 mg by mouth daily. Swallow whole.   atorvastatin  (LIPITOR) 20 MG tablet TAKE 1 TABLET BY MOUTH DAILY   atorvastatin  (LIPITOR) 20 MG tablet Take 1 tablet (20 mg total) by mouth daily.   B Complex-C (B-COMPLEX WITH VITAMIN C) tablet Take 1 tablet by mouth daily.   gabapentin  (NEURONTIN ) 100 MG capsule Take 1 capsule (100 mg total) by mouth at bedtime.   gabapentin  (NEURONTIN ) 300 MG capsule Take 1 capsule (300 mg total) by mouth at bedtime.   loperamide (IMODIUM A-D) 2 MG tablet Take 2 mg by mouth as needed for diarrhea or loose stools.   midodrine  (PROAMATINE ) 2.5 MG tablet TAKE 1 TABLET BY MOUTH IMMEDIATELY AFTER DIALYSIS THEN ANOTHER TABLET 4 HOURS LATER   omeprazole (PRILOSEC OTC) 20 MG tablet Take 20 mg by mouth daily.   pantoprazole  (PROTONIX ) 40 MG tablet Take 40 mg by mouth daily.   allopurinol  (ZYLOPRIM ) 300 MG tablet Take 1 tablet (300 mg total) by mouth daily. (Patient not taking: Reported on 07/26/2023)   pantoprazole  (PROTONIX ) 40 MG tablet Take 1 tablet (40 mg total) by mouth daily. (Patient not taking: Reported on 07/26/2023)   No current facility-administered medications on file  prior to visit.     Allergies  Allergen Reactions   Gabapentin  Other (See Comments)  Dizziness Loss of mental capacity   Simvastatin Other (See Comments)    Abdominal cramps   Nitrostat [Nitroglycerin] Other (See Comments)    Causes blood pressure to bottom out    Review Of Systems:  Constitutional:   No  weight loss, night sweats,  Fevers, chills, fatigue, or  lassitude.  HEENT:   No headaches,  Difficulty swallowing,  Tooth/dental problems, or  Sore throat,                No sneezing, itching, ear ache, nasal congestion, post nasal drip,   CV:  No chest pain,  Orthopnea, PND, swelling in lower extremities, anasarca, dizziness, palpitations, syncope.   GI  No heartburn, indigestion, abdominal pain, nausea, vomiting, diarrhea, change in bowel habits, loss of appetite, bloody stools.   Resp: No shortness of breath with exertion or at rest.  No excess mucus, no productive cough,  No non-productive cough,  No coughing up of blood.  No change in color of mucus.  No wheezing.  No chest wall deformity  Skin: no rash or lesions.  GU: no dysuria, change in color of urine, no urgency or frequency.  No flank pain, no hematuria   MS:  No joint pain or swelling.  No decreased range of motion.  No back pain.  Psych:  No change in mood or affect. No depression or anxiety.  No memory loss.   Vital Signs BP 134/64 (BP Location: Left Arm, Patient Position: Sitting, Cuff Size: Normal)   Pulse 74   Temp 97.7 F (36.5 C) (Oral)   Ht 5' 6 (1.676 m)   Wt 154 lb (69.9 kg)   SpO2 99%   BMI 24.86 kg/m    Physical Exam:  General- No distress,  A&Ox3, pleasant ENT: No sinus tenderness, TM clear, pale nasal mucosa, no oral exudate,no post nasal drip, no LAN Cardiac: S1, S2, regular rate and rhythm, no murmur Chest: No wheeze/ rales/ dullness; no accessory muscle use, no nasal flaring, no sternal retractions Abd.: Soft Non-tender, ND, BS +,Body mass index is 24.86 kg/m.  Ext: No  clubbing cyanosis, edema, no obvious deformities Neuro:  normal strength, MAE x 4, A&O x 3, appriopriate Skin: No rashes, warm and dry, no obvious skin lesions  Psych: normal mood and behavior   Assessment/Plan New diagnosis Adenocarcinoma of the Right Upper Lung Former smoker , quit 1985 History of renal cancer 03/2019, Nephrectomy 03/31/2019 End Stage Renal Disease on HD Post bronchoscopy with biopsies A fib with pacemaker Plan I am glad you have done well after the biopsy. The biopsy of the right upper lobe was positive  for adenocarcinoma. This is a non small cell lung cancer.  Radiation therapy is a good option for treatment . I will refer you to radiation oncology for consideration of radiation treatment. You will get a call to get this scheduled.  If you do not hear from them in a week please call them.  They are usually good about calling to schedule appointments quickly They can help you decide best options for treatment and goals of care with your new diagnosis. They will give you a lot of information and answer questions. This will help you make the best decision for you, whatever that is. .  Please call if you need us  sooner. Please contact office for sooner follow up if symptoms do not improve or worsen or seek emergency care   I spent 25 minutes dedicated to the care of this patient on the date  of this encounter to include pre-visit review of records, face-to-face time with the patient discussing conditions above, post visit ordering of testing, clinical documentation with the electronic health record, making appropriate referrals as documented, and communicating necessary information to the patient's healthcare team.       Lauraine JULIANNA Lites, NP 07/26/2023  1:05 PM

## 2023-07-26 NOTE — Progress Notes (Signed)
 Thoracic Location of Tumor / Histology: Right Upper Lobe Lung  Patient has been followed in surveillance for renal cancer and was noted on imaging to have a suspicious lung nodule.  Bronchoscopy 07/17/2023:   PET 06/29/2023: Hypermetabolic RIGHT upper lobe pulmonary nodule concerning for primary lung cancer.  No evidence of metastatic adenopathy or distant metastatic disease.  Solitary LEFT kidney.   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  -Due to the patient's age and his dialysis dependence and heart disease he is not interested in surgery.  -I have referred to radiation oncology as an option for treatment.     Past/Anticipated interventions by cardiothoracic surgery, if any:   Past/Anticipated interventions by medical oncology, if any:    Tobacco/Marijuana/Snuff/ETOH use: Former smoker   Signs/Symptoms Weight changes, if any:  Respiratory complaints, if any:  Hemoptysis, if any:  Pain issues, if any:    SAFETY ISSUES: Prior radiation?  Pacemaker/ICD?   Possible current pregnancy? N/a Is the patient on methotrexate?   Current Complaints / other details:   Dialysis:

## 2023-07-26 NOTE — Patient Instructions (Addendum)
 It is good to see you today. I am glad you have done well after the biopsy. The biopsy of the right upper lobe was + for adenocarcinoma. This is a non small cell lung cancer.  Radiation therapy is a good option for treatment . I will refer you to radiation oncology for consideration of radiation treatment. You will get a call to get this scheduled.  If you do not hear from them in a week please call them.  They are usually good about calling for appointments. They can help you decide best options for treatment and goals of care. They will give you a lot of information and answer questions. This will help you make the best decision for you, whatever that is. .  Please call if you need us  sooner. Please contact office for sooner follow up if symptoms do not improve or worsen or seek emergency care

## 2023-07-27 ENCOUNTER — Ambulatory Visit
Admission: RE | Admit: 2023-07-27 | Discharge: 2023-07-27 | Disposition: A | Discharge status: Home / Self Care | Source: Ambulatory Visit | Attending: Radiation Oncology | Admitting: Radiation Oncology

## 2023-07-27 ENCOUNTER — Encounter: Payer: Self-pay | Admitting: Hematology

## 2023-07-27 ENCOUNTER — Encounter (HOSPITAL_COMMUNITY): Payer: Self-pay

## 2023-07-27 ENCOUNTER — Encounter: Payer: Self-pay | Admitting: Radiation Oncology

## 2023-07-27 VITALS — BP 135/55 | HR 87 | Temp 97.1°F | Resp 18 | Ht 66.0 in | Wt 151.8 lb

## 2023-07-27 DIAGNOSIS — I129 Hypertensive chronic kidney disease with stage 1 through stage 4 chronic kidney disease, or unspecified chronic kidney disease: Secondary | ICD-10-CM | POA: Diagnosis not present

## 2023-07-27 DIAGNOSIS — C3411 Malignant neoplasm of upper lobe, right bronchus or lung: Secondary | ICD-10-CM | POA: Insufficient documentation

## 2023-07-27 DIAGNOSIS — Z85528 Personal history of other malignant neoplasm of kidney: Secondary | ICD-10-CM | POA: Diagnosis not present

## 2023-07-27 DIAGNOSIS — Z992 Dependence on renal dialysis: Secondary | ICD-10-CM | POA: Diagnosis not present

## 2023-07-27 DIAGNOSIS — D631 Anemia in chronic kidney disease: Secondary | ICD-10-CM | POA: Diagnosis not present

## 2023-07-27 DIAGNOSIS — R911 Solitary pulmonary nodule: Secondary | ICD-10-CM | POA: Diagnosis not present

## 2023-07-27 DIAGNOSIS — N186 End stage renal disease: Secondary | ICD-10-CM | POA: Diagnosis not present

## 2023-07-27 DIAGNOSIS — D509 Iron deficiency anemia, unspecified: Secondary | ICD-10-CM | POA: Diagnosis not present

## 2023-07-27 DIAGNOSIS — Z7982 Long term (current) use of aspirin: Secondary | ICD-10-CM | POA: Insufficient documentation

## 2023-07-27 DIAGNOSIS — Z79899 Other long term (current) drug therapy: Secondary | ICD-10-CM | POA: Insufficient documentation

## 2023-07-27 DIAGNOSIS — N183 Chronic kidney disease, stage 3 unspecified: Secondary | ICD-10-CM | POA: Insufficient documentation

## 2023-07-27 DIAGNOSIS — I4891 Unspecified atrial fibrillation: Secondary | ICD-10-CM | POA: Diagnosis not present

## 2023-07-27 DIAGNOSIS — E7800 Pure hypercholesterolemia, unspecified: Secondary | ICD-10-CM | POA: Insufficient documentation

## 2023-07-27 DIAGNOSIS — N2581 Secondary hyperparathyroidism of renal origin: Secondary | ICD-10-CM | POA: Diagnosis not present

## 2023-07-27 DIAGNOSIS — Z86718 Personal history of other venous thrombosis and embolism: Secondary | ICD-10-CM | POA: Diagnosis not present

## 2023-07-27 DIAGNOSIS — M109 Gout, unspecified: Secondary | ICD-10-CM | POA: Insufficient documentation

## 2023-07-27 DIAGNOSIS — D689 Coagulation defect, unspecified: Secondary | ICD-10-CM | POA: Diagnosis not present

## 2023-07-27 DIAGNOSIS — E876 Hypokalemia: Secondary | ICD-10-CM | POA: Diagnosis not present

## 2023-07-27 DIAGNOSIS — Z87891 Personal history of nicotine dependence: Secondary | ICD-10-CM | POA: Insufficient documentation

## 2023-07-27 DIAGNOSIS — N400 Enlarged prostate without lower urinary tract symptoms: Secondary | ICD-10-CM | POA: Insufficient documentation

## 2023-07-27 DIAGNOSIS — R197 Diarrhea, unspecified: Secondary | ICD-10-CM | POA: Diagnosis not present

## 2023-07-27 NOTE — Progress Notes (Signed)
 Radiation Oncology         [phone] ________________________________  Name: Paul Price        MRN: [id number]  Date of Service: 07/27/2023 DOB: 25-Apr-1940  RR:Eyjmm, Ryan, MD  Shelah Lamar RAMAN, MD     REFERRING PHYSICIAN: Shelah Lamar RAMAN, MD   DIAGNOSIS: The encounter diagnosis was Malignant neoplasm of right upper lobe of lung (HCC).   HISTORY OF PRESENT ILLNESS: Paul Price is a 83 y.o. male with a medical history significant for former smoker (30 pack years), history of renal cell carcinoma seen at the request of Dr. Shelah for a newly diagnosed right lung cancer.   Originally, a suspicious pulmonary nodule was found on imaging by patient's nephrologist. Subsequently, he proceeded with a PET scan on 06/29/2023 that showed a 1.3 x 0.9 cm hypermetabolic right upper lobe pulmonary nodule, and no evidence of metastatic adenopathy or distant metastatic disease.  He underwent bronchoscopy and biopsy of the RUL nodule under the care of Dr. Shelah on 07/17/2023.  Surgical pathology revealed adenocarcinoma.  Patient met with Lauraine Bad, NP, to discuss the results of her biopsy.  Given his age, and other comorbidities he is not interested in surgery.  He was kindly referred to us  today to discuss treatment options.   PREVIOUS RADIATION THERAPY: No   PAST MEDICAL HISTORY:  Past Medical History:  Diagnosis Date   Arthritis    shoulders (09/07/2016)   Atrial fibrillation (HCC)    BPH (benign prostatic hyperplasia)    Chronic kidney disease (CKD), stage III (moderate) (HCC)    Coronary artery disease    DDD (degenerative disc disease), cervical    DVT (deep venous thrombosis) (HCC) 12/2013   LLE   GERD (gastroesophageal reflux disease)    Gout    High cholesterol    History of blood transfusion 09/06/2016   2 u PRBC   History of scarlet fever 1940s   History of stress test 06/2011   No significant ischemia, this is a low risk scan. Clinical correlation recommended Abnormal  myocardial perfusion study.   Hx of echocardiogram 05/2009   EF 40-45%, he did have mild annular calcification with mild-to-moderate MR and mild TR as well as aortic valve sclerosis. Estimated RV systolic pressure was 21 mm.   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   Neck pain    not chronic (09/07/2016)   Obesity    OSA on CPAP    Presence of permanent cardiac pacemaker    RBBB    Renal insufficiency    Right renal mass    Spinal stenosis of lumbar region        PAST SURGICAL HISTORY: Past Surgical History:  Procedure Laterality Date   A/V FISTULAGRAM N/A 01/17/2023   Procedure: A/V Fistulagram;  Surgeon: Melia Lynwood ORN, MD;  Location: [facility];  Service: Cardiovascular;  Laterality: N/A;   A/V SHUNT INTERVENTION Right 07/23/2023   Procedure: A/V SHUNT INTERVENTION;  Surgeon: Lanis Fonda FORBES, MD;  Location: [facility];  Service: Cardiovascular;  Laterality: Right;   APPENDECTOMY     AV FISTULA PLACEMENT Right 02/09/2020   Procedure: ARTERIOVENOUS (AV) FISTULA  CREATION RIGHT UPPER EXTREMITY;  Surgeon: Gretta Lonni PARAS, MD;  Location: [facility] OR;  Service: Vascular;  Laterality: Right;   BASCILIC VEIN TRANSPOSITION Right 03/30/2020   Procedure: RIGHT SECOND STAGE BASCILIC VEIN TRANSPOSITION;  Surgeon: Sheree,  Penne Bruckner, MD;  Location: [facility] OR;  Service: Vascular;  Laterality: Right;   CARDIAC CATHETERIZATION  2009   he was found to have a atretic LIMA graft to his LAD and had a patent vein graft supplying his diagonal vessel. At that time, he had 70% narrowing in his LAD beyond a diagonal vessel which was initially treated medically.   COLONOSCOPY WITH PROPOFOL  Left 09/09/2016   Procedure: COLONOSCOPY WITH PROPOFOL ;  Surgeon: Burnette Fallow, MD;  Location: [facility] ENDOSCOPY;  Service: Endoscopy;  Laterality: Left;   CORONARY ANGIOPLASTY WITH STENT PLACEMENT  April 2012   LAD DES   CORONARY ARTERY BYPASS GRAFT   1992   with LIMA to the LAD and diagonal.   CYSTOSCOPY N/A 03/31/2019   Procedure: CYSTOSCOPY FLEXIBLE;  Surgeon: Carolee Sherwood JONETTA DOUGLAS, MD;  Location: [facility] ORS;  Service: Urology;  Laterality: N/A;   CYSTOSCOPY WITH URETHRAL DILATATION N/A 05/21/2019   Procedure: CYSTOSCOPY WITH URETHRAL BALLOON DILATATION;  Surgeon: Carolee Sherwood JONETTA DOUGLAS, MD;  Location: [facility] ORS;  Service: Urology;  Laterality: N/A;   ESOPHAGOGASTRODUODENOSCOPY N/A 11/16/2019   Procedure: ESOPHAGOGASTRODUODENOSCOPY (EGD);  Surgeon: Dianna Specking, MD;  Location: [facility] ENDOSCOPY;  Service: Endoscopy;  Laterality: N/A;   ESOPHAGOGASTRODUODENOSCOPY (EGD) WITH PROPOFOL  Left 09/08/2016   Procedure: ESOPHAGOGASTRODUODENOSCOPY (EGD) WITH PROPOFOL ;  Surgeon: Saintclair Jasper, MD;  Location: [facility] ENDOSCOPY;  Service: Gastroenterology;  Laterality: Left;   EXCHANGE OF A DIALYSIS CATHETER Right 02/09/2020   Procedure: EXCHANGE OF A DIALYSIS CATHETER;  Surgeon: Gretta Bruckner PARAS, MD;  Location: [facility] OR;  Service: Vascular;  Laterality: Right;   GIVENS CAPSULE STUDY N/A 11/16/2019   Procedure: GIVENS CAPSULE STUDY;  Surgeon: Dianna Specking, MD;  Location: [facility] ENDOSCOPY;  Service: Endoscopy;  Laterality: N/A;   INGUINAL HERNIA REPAIR     don't remeimber which side   IR FLUORO GUIDE CV LINE RIGHT  02/05/2020   IR US  GUIDE VASC ACCESS RIGHT  02/05/2020   LAPAROSCOPIC NEPHRECTOMY Right 03/31/2019   Procedure: RIGHT  HAND ASSIST LAPAROSCOPIC NEPHRECTOMY;  Surgeon: Carolee Sherwood JONETTA DOUGLAS, MD;  Location: [facility] ORS;  Service: Urology;  Laterality: Right;   PACEMAKER IMPLANT N/A 12/25/2016   Procedure: PACEMAKER IMPLANT;  Surgeon: Fernande Elspeth BROCKS, MD;  Location: [facility] INVASIVE CV LAB;  Service: Cardiovascular;  Laterality: N/A;   PERIPHERAL VASCULAR BALLOON ANGIOPLASTY Right 01/17/2023   Procedure: PERIPHERAL VASCULAR BALLOON ANGIOPLASTY;  Surgeon: Melia Lynwood ORN, MD;  Location: [facility] CV LAB;  Service: Cardiovascular;  Laterality: Right;  basilic inflow   TEE WITHOUT  CARDIOVERSION N/A 02/06/2020   Procedure: TRANSESOPHAGEAL ECHOCARDIOGRAM (TEE);  Surgeon: Raford Riggs, MD;  Location: [facility] ENDOSCOPY;  Service: Cardiovascular;  Laterality: N/A;   TONSILLECTOMY     VIDEO BRONCHOSCOPY WITH ENDOBRONCHIAL NAVIGATION Right 07/17/2023   Procedure: VIDEO BRONCHOSCOPY WITH ENDOBRONCHIAL NAVIGATION;  Surgeon: Shelah Lamar RAMAN, MD;  Location: [facility] ENDOSCOPY;  Service: Pulmonary;  Laterality: Right;     FAMILY HISTORY:  Family History  Problem Relation Age of Onset   Heart disease Father      SOCIAL HISTORY:  reports that he quit smoking about 40 years ago. His smoking use included cigarettes. He has a 30 pack-year smoking history. He has never used smokeless tobacco. He reports that he does not currently use alcohol. He reports that he does not use drugs.   ALLERGIES: Gabapentin , Simvastatin, and Nitrostat [nitroglycerin]   MEDICATIONS:  Current Outpatient Medications  Medication Sig Dispense Refill   acetaminophen  (TYLENOL ) 500 MG tablet Take 1,500-2,000 mg by mouth daily  as needed for mild pain (pain score 1-3), moderate pain (pain score 4-6) or headache.     allopurinol  (ZYLOPRIM ) 300 MG tablet Take 300 mg by mouth daily.     allopurinol  (ZYLOPRIM ) 300 MG tablet Take 1 tablet (300 mg total) by mouth daily. (Patient not taking: Reported on 07/26/2023) 90 tablet 1   aspirin  EC 81 MG tablet Take 81 mg by mouth daily. Swallow whole.     atorvastatin  (LIPITOR) 20 MG tablet TAKE 1 TABLET BY MOUTH DAILY 90 tablet 2   atorvastatin  (LIPITOR) 20 MG tablet Take 1 tablet (20 mg total) by mouth daily. 90 tablet 2   B Complex-C (B-COMPLEX WITH VITAMIN C) tablet Take 1 tablet by mouth daily.     gabapentin  (NEURONTIN ) 100 MG capsule Take 1 capsule (100 mg total) by mouth at bedtime. 30 capsule 2   gabapentin  (NEURONTIN ) 300 MG capsule Take 1 capsule (300 mg total) by mouth at bedtime. 30 capsule 2   loperamide (IMODIUM A-D) 2 MG tablet Take 2 mg by mouth as needed for  diarrhea or loose stools.     midodrine  (PROAMATINE ) 2.5 MG tablet TAKE 1 TABLET BY MOUTH IMMEDIATELY AFTER DIALYSIS THEN ANOTHER TABLET 4 HOURS LATER 90 tablet 0   omeprazole (PRILOSEC OTC) 20 MG tablet Take 20 mg by mouth daily.     pantoprazole  (PROTONIX ) 40 MG tablet Take 40 mg by mouth daily.     pantoprazole  (PROTONIX ) 40 MG tablet Take 1 tablet (40 mg total) by mouth daily. (Patient not taking: Reported on 07/26/2023) 30 tablet 11   No current facility-administered medications for this encounter.     REVIEW OF SYSTEMS: On review of systems, the patient reports that he is doing well overall. he denies any chest pain, shortness of breath, cough, or changes to his breathing. He is receiving dialysis three times a week.      PHYSICAL EXAM:  Wt Readings from Last 3 Encounters:  07/27/23 151 lb 12.8 oz (68.9 kg)  07/26/23 154 lb (69.9 kg)  07/17/23 154 lb 15.7 oz (70.3 kg)   Temp Readings from Last 3 Encounters:  07/27/23 (!) 97.1 F (36.2 C)  07/26/23 97.7 F (36.5 C) (Oral)  07/23/23 (!) 97.5 F (36.4 C) (Oral)   BP Readings from Last 3 Encounters:  07/27/23 (!) 135/55  07/26/23 134/64  07/23/23 (!) 135/59   Pulse Readings from Last 3 Encounters:  07/27/23 87  07/26/23 74  07/23/23 61   Pain Assessment Pain Score: 2  Pain Loc: Arm (R arm from dialysis)/10  In general this is a well appearing male in no acute distress. He's alert and oriented x4 and appropriate throughout the examination. Cardiopulmonary assessment is negative for acute distress and he exhibits normal effort.     ECOG = 0  0 - Asymptomatic (Fully active, able to carry on all predisease activities without restriction)  1 - Symptomatic but completely ambulatory (Restricted in physically strenuous activity but ambulatory and able to carry out work of a light or sedentary nature. For example, light housework, office work)  2 - Symptomatic, <50% in bed during the day (Ambulatory and capable of all self  care but unable to carry out any work activities. Up and about more than 50% of waking hours)  3 - Symptomatic, >50% in bed, but not bedbound (Capable of only limited self-care, confined to bed or chair 50% or more of waking hours)  4 - Bedbound (Completely disabled. Cannot carry on any self-care. Totally confined  to bed or chair)  5 - Death   Raylene MM, Creech RH, Tormey DC, et al. [phone]). Toxicity and response criteria of the [facility]. Am. DOROTHA Bridges. Oncol. 5 (6): 649-55    LABORATORY DATA:  Lab Results  Component Value Date   WBC 9.4 02/12/2020   HGB 12.9 (L) 07/17/2023   HCT 38.0 (L) 07/17/2023   MCV 85.6 02/12/2020   PLT 141 (L) 02/12/2020   Lab Results  Component Value Date   NA 140 07/17/2023   K 4.7 07/17/2023   CL 98 07/17/2023   CO2 22 02/12/2020   Lab Results  Component Value Date   ALT 22 02/02/2020   AST 28 02/02/2020   ALKPHOS 109 02/02/2020   BILITOT 0.9 02/02/2020      RADIOGRAPHY: PERIPHERAL VASCULAR CATHETERIZATION Result Date: 07/24/2023 Images from the original result were not included.   [name]           MRN: [id number]        DOB: 28-Jul-1940            Sex: male  07/23/2023 Pre-operative Diagnosis: Right arm brachiobasilic fistula malfunction Post-operative diagnosis:  Same Surgeon:  Fonda FORBES Rim, MD Procedure Performed: 1.  Ultrasound-guided micropuncture access of the right brachiobasilic fistula 2.  Fistulogram 3.  6 x 60 balloon venoplasty immediately distal to arterial anastomosis 4.  8 x 20 balloon venoplasty mid fistula 5.  Contrast volume 50 mL 6.  Access managed using Monocryl suture  Indications: Patient is an 83 year old male with end-stage renal disease currently being dialyzed through a right sided brachiobasilic fistula.  The fistula has been running poorly, unable to complete dialysis.  After discussing the risks and benefits of right arm fistulogram in an effort to define and improve perfusion,  Abram elected to proceed.  Findings: Widely patent arterial anastomosis immediately followed by greater than 80% stenosis spanning 4 cm To aneurysm appreciated mid fistula, in between these aneurysms there was focal 80% stenosis Widely patent axillary vein, subclavian vein, brachiocephalic vein, superior vena cava             Procedure:  The patient was identified in the holding area and taken to room 8.  The patient was then placed supine on the table and prepped and draped in the usual sterile fashion.  A time out was called.  Ultrasound was used to evaluate the right brachiobasilic fistula.  This was accessed in retrograde fashion using an ultrasound-guided micropuncture needle.  The needle was then exchanged for a micropuncture sheath and fistulogram followed.  See results above.  I elected to intervene on the 2 areas of severe stenosis.  The patient was heparinized and a 6 French sheath placed.  A Glidewire advantage was used to navigate into the brachial artery.  Next, a 6 x 60 mm balloon was brought onto the field and expanded from the arterial anastomosis into the fistula.  This was inflated for 2 minutes with follow-up angiogram demonstrating resolution of flow-limiting stenosis.  I then moved to the sheath back, and inflated the 6 x 60 mm balloon between the 2 aneurysmal portions of the fistula.  While this improved the stenosis, I thought that the vein could handle an 8 mm balloon, therefore an 8 x 20 mm balloon was brought to the field and expanded along the focal lesion.  Follow-up angiography demonstrated excellent result with resolution of flow-limiting stenosis throughout the entirety of the fistula.  The fistula remains  amenable to future endovascular intervention The fistula can continue to be used for access    Fonda FORBES Rim MD [facility] Office: [phone]    DG Chest Port 1 View Result Date: 07/17/2023 CLINICAL DATA:  [id number] S/P bronchoscopy with biopsy  [id number]. EXAM: PORTABLE CHEST 1 VIEW COMPARISON:  05/27/2020. FINDINGS: Low lung volume. Note is made of elevated right hemidiaphragm. There are probable atelectatic changes at the lung bases, left more than right. Bilateral lung fields are otherwise clear. No acute consolidation or lung collapse. Bilateral costophrenic angles are clear. Stable cardio-mediastinal silhouette. There are surgical staples along the heart border and sternotomy wires, status post CABG (coronary artery bypass graft). There is a left sided 2-lead pacemaker. No acute osseous abnormalities. The soft tissues are within normal limits. IMPRESSION: Low lung volume. Probable atelectatic changes at the lung bases, left more than right. No acute cardiopulmonary abnormality. Electronically Signed   By: Ree Molt M.D.   On: 07/17/2023 11:50   DG C-ARM BRONCHOSCOPY Result Date: 07/17/2023 C-ARM BRONCHOSCOPY: Fluoroscopy was utilized by the requesting physician.  No radiographic interpretation.   NM PET SUPER D CT Result Date: 07/02/2023 CLINICAL DATA:  Suspicious pulmonary nodule. Concern for primary bronchogenic carcinoma. * Tracking Code: BO * EXAM: CT CHEST WITHOUT CONTRAST TECHNIQUE: Multidetector CT imaging of the chest was performed using thin slice collimation for electromagnetic bronchoscopy planning purposes, without intravenous contrast. RADIATION DOSE REDUCTION: This exam was performed according to the departmental dose-optimization program which includes automated exposure control, adjustment of the mA and/or kV according to patient size and/or use of iterative reconstruction technique. COMPARISON:  PET-CT scan same day FINDINGS: Cardiovascular: Post CABG.  LEFT-sided pacer. Mediastinum/Nodes: No axillary or supraclavicular adenopathy. No mediastinal or hilar adenopathy. No pericardial fluid. Esophagus normal. Lungs/Pleura: RIGHT upper lobe pulmonary nodule measures 13 mm 9 mm (image 64/series 4. This nodule touches the pleural  surface. The nodule was hypermetabolic on comparison FDG PET scan. No additional pulmonary nodules. There is mild bronchiectasis in lower lobes. Mild basilar scarring. Upper Abdomen: Limited view of the liver, kidneys, pancreas are unremarkable. Normal adrenal glands. Musculoskeletal: No aggressive osseous lesion. IMPRESSION: 1. RIGHT upper lobe pulmonary nodule is suspicious for primary bronchogenic carcinoma. 2. No evidence of metastatic adenopathy in the chest. 3. Mild bronchiectasis and scarring in the lower lobes. Electronically Signed   By: Jackquline Boxer M.D.   On: 07/02/2023 16:37   NM PET Image Initial (PI) Skull Base To Thigh Result Date: 07/02/2023 CLINICAL DATA:  Initial treatment strategy for RIGHT upper lobe pulmonary nodule. EXAM: NUCLEAR MEDICINE PET SKULL BASE TO THIGH TECHNIQUE: 7.7 mCi F-18 FDG was injected intravenously. Full-ring PET imaging was performed from the skull base to thigh after the radiotracer. CT data was obtained and used for attenuation correction and anatomic localization. Fasting blood glucose: 95 mg/dl COMPARISON:  CT 97/79/7974 FINDINGS: NECK: No hypermetabolic lymph nodes in the neck. Incidental CT findings: None. CHEST: Nodule of concern along the posterior aspect of the RIGHT upper lobe measures 12 mm (57/4) and has associated hypermetabolic activity with SUV max equal 3.0 on image 57. The small nodule touches the pleural surface. No additional hypermetabolic pulmonary nodules. No hypermetabolic mediastinal lymph nodes. Incidental CT findings: Post CABG. ABDOMEN/PELVIS: No abnormal hypermetabolic activity within the liver, pancreas, adrenal glands, or spleen. No hypermetabolic lymph nodes in the abdomen or pelvis. Incidental CT findings: Gallstones noted. Solitary LEFT kidney. The solitary kidney demonstrates cortical thinning. Simple cyst of the LEFT kidney. SKELETON:  No focal hypermetabolic activity to suggest skeletal metastasis. Incidental CT findings: None.  IMPRESSION: 1. Hypermetabolic RIGHT upper lobe pulmonary nodule concerning for primary lung cancer 2. No evidence of metastatic adenopathy or distant metastatic disease. 3. Solitary LEFT kidney. Electronically Signed   By: Jackquline Boxer M.D.   On: 07/02/2023 16:32       IMPRESSION/PLAN: 1. Stage IA2 (cT1b, N0, M0) adenocarcinoma, NSCLC, of the RUL  We reviewed this patient's current workup.  He presents today with a 1.3 cm RUL nodule, biopsy proven adenocarcinoma.  Given his age and comorbidities, he is not interested in surgery at this time.  Dr. Dewey recommends a course of stereotactic body radiotherapy (SBRT) to the right upper lobe nodule.  Today, we talked to the patient and family about the findings and work-up thus far.  We discussed the natural history of early-stage lung cancer and general treatment, highlighting the role of radiotherapy in the management.  We discussed the available radiation techniques, and focused on the details of logistics and delivery.  We reviewed the anticipated acute and late sequelae associated with radiation in this setting.  The patient was encouraged to ask questions that I answered to the best of my ability. A patient consent form was discussed and signed.  We retained a copy for our records.  The patient would like to proceed with radiation and will be scheduled for CT simulation. We look forward to participating in this patient's care.    In a visit lasting 60 minutes, greater than 50% of the time was spent face to face discussing the patient's condition, in preparation for the discussion, and coordinating the patient's care.   The above documentation reflects my direct findings during this shared patient visit. Please see the separate note by Dr. Dewey on this date for the remainder of the patient's plan of care.    Leeroy Due, PA-C    **Disclaimer: This note was dictated with voice recognition software. Similar sounding words can inadvertently be  transcribed and this note may contain transcription errors which may not have been corrected upon publication of note.**

## 2023-07-30 ENCOUNTER — Encounter: Payer: Self-pay | Admitting: Cardiology

## 2023-07-30 DIAGNOSIS — D689 Coagulation defect, unspecified: Secondary | ICD-10-CM | POA: Diagnosis not present

## 2023-07-30 DIAGNOSIS — Z87891 Personal history of nicotine dependence: Secondary | ICD-10-CM | POA: Diagnosis not present

## 2023-07-30 DIAGNOSIS — N186 End stage renal disease: Secondary | ICD-10-CM | POA: Diagnosis not present

## 2023-07-30 DIAGNOSIS — C3411 Malignant neoplasm of upper lobe, right bronchus or lung: Secondary | ICD-10-CM | POA: Diagnosis not present

## 2023-07-30 DIAGNOSIS — Z992 Dependence on renal dialysis: Secondary | ICD-10-CM | POA: Diagnosis not present

## 2023-07-30 DIAGNOSIS — E876 Hypokalemia: Secondary | ICD-10-CM | POA: Diagnosis not present

## 2023-07-30 DIAGNOSIS — D631 Anemia in chronic kidney disease: Secondary | ICD-10-CM | POA: Diagnosis not present

## 2023-07-30 DIAGNOSIS — D509 Iron deficiency anemia, unspecified: Secondary | ICD-10-CM | POA: Diagnosis not present

## 2023-07-30 DIAGNOSIS — N2581 Secondary hyperparathyroidism of renal origin: Secondary | ICD-10-CM | POA: Diagnosis not present

## 2023-07-30 DIAGNOSIS — R197 Diarrhea, unspecified: Secondary | ICD-10-CM | POA: Diagnosis not present

## 2023-07-30 NOTE — Progress Notes (Signed)
 TO BE COMPLETED BY RADIATION ONCOLOGIST OFFICE:   [name]   Date of Birth: 1940/02/15   Radiation Oncologist: Dr. Norleen Limes   Site to be Treated: Right Upper Lobe Lung   Will x-rays >10 MV be used? No   Will the radiation be >10 cm from the device? Yes   Planned Treatment Start Date: TBD   TO BE COMPLETED BY CARDIOLOGIST OFFICE:   Device Information:  Pacemaker [x]      ICD []    Brand: [employer]: [id number] Model #: [id number]  Serial Number: [id number]   Date of Placement: 12/25/2016  Site of Placement: left upper chest  Remote Device Check--Frequency: q 91 days   Last Check: 05/15/2023  Is the Patient Pacer Dependent?:  Yes []   No [x]   Does cardiologist request Radiation Oncology to schedule device testing by vendor for the following:  Prior to the Initiation of Treatments?  Yes []  No [x]  During Treatments?  Yes []  No [x]  Post Radiation Treatments?  Yes []  No [x]   Is device monitoring necessary by vendor/cardiologist team during treatments?  Yes []   No [x]   Is cardiac monitoring by Radiation Oncology nursing necessary during treatments? Yes []   No [x]   Do you recommend device be relocated prior to Radiation Treatment? Yes []   No [x]   **PLEASE LIST ANY NOTES OR SPECIAL REQUESTS: Please advise when treatment's will be completed.  Will schedule a remote check for device at conclusion of radiation treatment's.      CARDIOLOGIST SIGNATURE:  Dr.  Fonda Kitty Per Device Clinic Standing Orders, [name]  07/30/2023 4:28 PM  **Please route completed form back to Radiation Oncology Nursing and P [facility] RAD ONC ADMIN, OR send an update if there will be a delay in having form completed by expected start date.  **Call [phone] if you have any questions or do not get an in-basket response from a Radiation Oncology staff member

## 2023-08-01 DIAGNOSIS — D689 Coagulation defect, unspecified: Secondary | ICD-10-CM | POA: Diagnosis not present

## 2023-08-01 DIAGNOSIS — D509 Iron deficiency anemia, unspecified: Secondary | ICD-10-CM | POA: Diagnosis not present

## 2023-08-01 DIAGNOSIS — N186 End stage renal disease: Secondary | ICD-10-CM | POA: Diagnosis not present

## 2023-08-01 DIAGNOSIS — N2581 Secondary hyperparathyroidism of renal origin: Secondary | ICD-10-CM | POA: Diagnosis not present

## 2023-08-01 DIAGNOSIS — E876 Hypokalemia: Secondary | ICD-10-CM | POA: Diagnosis not present

## 2023-08-01 DIAGNOSIS — R197 Diarrhea, unspecified: Secondary | ICD-10-CM | POA: Diagnosis not present

## 2023-08-01 DIAGNOSIS — D631 Anemia in chronic kidney disease: Secondary | ICD-10-CM | POA: Diagnosis not present

## 2023-08-01 DIAGNOSIS — Z992 Dependence on renal dialysis: Secondary | ICD-10-CM | POA: Diagnosis not present

## 2023-08-02 ENCOUNTER — Ambulatory Visit
Admission: RE | Admit: 2023-08-02 | Discharge: 2023-08-02 | Disposition: A | Discharge status: Home / Self Care | Source: Ambulatory Visit | Attending: Radiation Oncology | Admitting: Radiation Oncology

## 2023-08-02 ENCOUNTER — Other Ambulatory Visit: Payer: Self-pay | Admitting: *Deleted

## 2023-08-02 DIAGNOSIS — C3411 Malignant neoplasm of upper lobe, right bronchus or lung: Secondary | ICD-10-CM | POA: Insufficient documentation

## 2023-08-02 DIAGNOSIS — Z87891 Personal history of nicotine dependence: Secondary | ICD-10-CM | POA: Diagnosis not present

## 2023-08-02 NOTE — Progress Notes (Signed)
 The proposed treatment discussed in conference is for discussion purpose only and is not a binding recommendation.  The patients have not been physically examined, or presented with their treatment options.  Therefore, final treatment plans cannot be decided.

## 2023-08-03 DIAGNOSIS — N2581 Secondary hyperparathyroidism of renal origin: Secondary | ICD-10-CM | POA: Diagnosis not present

## 2023-08-03 DIAGNOSIS — R197 Diarrhea, unspecified: Secondary | ICD-10-CM | POA: Diagnosis not present

## 2023-08-03 DIAGNOSIS — D631 Anemia in chronic kidney disease: Secondary | ICD-10-CM | POA: Diagnosis not present

## 2023-08-03 DIAGNOSIS — N186 End stage renal disease: Secondary | ICD-10-CM | POA: Diagnosis not present

## 2023-08-03 DIAGNOSIS — E876 Hypokalemia: Secondary | ICD-10-CM | POA: Diagnosis not present

## 2023-08-03 DIAGNOSIS — D509 Iron deficiency anemia, unspecified: Secondary | ICD-10-CM | POA: Diagnosis not present

## 2023-08-03 DIAGNOSIS — D689 Coagulation defect, unspecified: Secondary | ICD-10-CM | POA: Diagnosis not present

## 2023-08-03 DIAGNOSIS — Z992 Dependence on renal dialysis: Secondary | ICD-10-CM | POA: Diagnosis not present

## 2023-08-06 DIAGNOSIS — D509 Iron deficiency anemia, unspecified: Secondary | ICD-10-CM | POA: Diagnosis not present

## 2023-08-06 DIAGNOSIS — Z992 Dependence on renal dialysis: Secondary | ICD-10-CM | POA: Diagnosis not present

## 2023-08-06 DIAGNOSIS — E876 Hypokalemia: Secondary | ICD-10-CM | POA: Diagnosis not present

## 2023-08-06 DIAGNOSIS — N186 End stage renal disease: Secondary | ICD-10-CM | POA: Diagnosis not present

## 2023-08-06 DIAGNOSIS — R197 Diarrhea, unspecified: Secondary | ICD-10-CM | POA: Diagnosis not present

## 2023-08-06 DIAGNOSIS — D631 Anemia in chronic kidney disease: Secondary | ICD-10-CM | POA: Diagnosis not present

## 2023-08-06 DIAGNOSIS — N2581 Secondary hyperparathyroidism of renal origin: Secondary | ICD-10-CM | POA: Diagnosis not present

## 2023-08-06 DIAGNOSIS — D689 Coagulation defect, unspecified: Secondary | ICD-10-CM | POA: Diagnosis not present

## 2023-08-08 DIAGNOSIS — Z992 Dependence on renal dialysis: Secondary | ICD-10-CM | POA: Diagnosis not present

## 2023-08-08 DIAGNOSIS — D509 Iron deficiency anemia, unspecified: Secondary | ICD-10-CM | POA: Diagnosis not present

## 2023-08-08 DIAGNOSIS — Z87891 Personal history of nicotine dependence: Secondary | ICD-10-CM | POA: Diagnosis not present

## 2023-08-08 DIAGNOSIS — C3411 Malignant neoplasm of upper lobe, right bronchus or lung: Secondary | ICD-10-CM | POA: Diagnosis not present

## 2023-08-08 DIAGNOSIS — N2581 Secondary hyperparathyroidism of renal origin: Secondary | ICD-10-CM | POA: Diagnosis not present

## 2023-08-08 DIAGNOSIS — N186 End stage renal disease: Secondary | ICD-10-CM | POA: Diagnosis not present

## 2023-08-08 DIAGNOSIS — D689 Coagulation defect, unspecified: Secondary | ICD-10-CM | POA: Diagnosis not present

## 2023-08-08 DIAGNOSIS — R197 Diarrhea, unspecified: Secondary | ICD-10-CM | POA: Diagnosis not present

## 2023-08-08 DIAGNOSIS — D631 Anemia in chronic kidney disease: Secondary | ICD-10-CM | POA: Diagnosis not present

## 2023-08-08 DIAGNOSIS — E876 Hypokalemia: Secondary | ICD-10-CM | POA: Diagnosis not present

## 2023-08-09 ENCOUNTER — Other Ambulatory Visit (HOSPITAL_COMMUNITY): Payer: Self-pay

## 2023-08-09 DIAGNOSIS — Z992 Dependence on renal dialysis: Secondary | ICD-10-CM | POA: Diagnosis not present

## 2023-08-09 DIAGNOSIS — I129 Hypertensive chronic kidney disease with stage 1 through stage 4 chronic kidney disease, or unspecified chronic kidney disease: Secondary | ICD-10-CM | POA: Diagnosis not present

## 2023-08-09 DIAGNOSIS — N186 End stage renal disease: Secondary | ICD-10-CM | POA: Diagnosis not present

## 2023-08-10 DIAGNOSIS — D631 Anemia in chronic kidney disease: Secondary | ICD-10-CM | POA: Diagnosis not present

## 2023-08-10 DIAGNOSIS — E876 Hypokalemia: Secondary | ICD-10-CM | POA: Diagnosis not present

## 2023-08-10 DIAGNOSIS — N186 End stage renal disease: Secondary | ICD-10-CM | POA: Diagnosis not present

## 2023-08-10 DIAGNOSIS — D509 Iron deficiency anemia, unspecified: Secondary | ICD-10-CM | POA: Diagnosis not present

## 2023-08-10 DIAGNOSIS — D689 Coagulation defect, unspecified: Secondary | ICD-10-CM | POA: Diagnosis not present

## 2023-08-10 DIAGNOSIS — N2581 Secondary hyperparathyroidism of renal origin: Secondary | ICD-10-CM | POA: Diagnosis not present

## 2023-08-10 DIAGNOSIS — Z992 Dependence on renal dialysis: Secondary | ICD-10-CM | POA: Diagnosis not present

## 2023-08-13 DIAGNOSIS — Z992 Dependence on renal dialysis: Secondary | ICD-10-CM | POA: Diagnosis not present

## 2023-08-13 DIAGNOSIS — E876 Hypokalemia: Secondary | ICD-10-CM | POA: Diagnosis not present

## 2023-08-13 DIAGNOSIS — D509 Iron deficiency anemia, unspecified: Secondary | ICD-10-CM | POA: Diagnosis not present

## 2023-08-13 DIAGNOSIS — N186 End stage renal disease: Secondary | ICD-10-CM | POA: Diagnosis not present

## 2023-08-13 DIAGNOSIS — N2581 Secondary hyperparathyroidism of renal origin: Secondary | ICD-10-CM | POA: Diagnosis not present

## 2023-08-13 DIAGNOSIS — D631 Anemia in chronic kidney disease: Secondary | ICD-10-CM | POA: Diagnosis not present

## 2023-08-13 DIAGNOSIS — D689 Coagulation defect, unspecified: Secondary | ICD-10-CM | POA: Diagnosis not present

## 2023-08-14 ENCOUNTER — Other Ambulatory Visit: Payer: Self-pay

## 2023-08-14 ENCOUNTER — Ambulatory Visit
Admission: RE | Admit: 2023-08-14 | Discharge: 2023-08-14 | Disposition: A | Discharge status: Home / Self Care | Source: Ambulatory Visit | Attending: Radiation Oncology | Admitting: Radiation Oncology

## 2023-08-14 DIAGNOSIS — C3411 Malignant neoplasm of upper lobe, right bronchus or lung: Secondary | ICD-10-CM | POA: Diagnosis not present

## 2023-08-14 DIAGNOSIS — Z87891 Personal history of nicotine dependence: Secondary | ICD-10-CM | POA: Insufficient documentation

## 2023-08-14 LAB — RAD ONC ARIA SESSION SUMMARY
Course Elapsed Days: 0
Plan Fractions Treated to Date: 1
Plan Prescribed Dose Per Fraction: 18 Gy
Plan Total Fractions Prescribed: 3
Plan Total Prescribed Dose: 54 Gy
Reference Point Dosage Given to Date: 18 Gy
Reference Point Session Dosage Given: 18 Gy
Session Number: 1

## 2023-08-15 DIAGNOSIS — D631 Anemia in chronic kidney disease: Secondary | ICD-10-CM | POA: Diagnosis not present

## 2023-08-15 DIAGNOSIS — N186 End stage renal disease: Secondary | ICD-10-CM | POA: Diagnosis not present

## 2023-08-15 DIAGNOSIS — E876 Hypokalemia: Secondary | ICD-10-CM | POA: Diagnosis not present

## 2023-08-15 DIAGNOSIS — D509 Iron deficiency anemia, unspecified: Secondary | ICD-10-CM | POA: Diagnosis not present

## 2023-08-15 DIAGNOSIS — Z992 Dependence on renal dialysis: Secondary | ICD-10-CM | POA: Diagnosis not present

## 2023-08-15 DIAGNOSIS — D689 Coagulation defect, unspecified: Secondary | ICD-10-CM | POA: Diagnosis not present

## 2023-08-15 DIAGNOSIS — N2581 Secondary hyperparathyroidism of renal origin: Secondary | ICD-10-CM | POA: Diagnosis not present

## 2023-08-16 ENCOUNTER — Other Ambulatory Visit: Payer: Self-pay

## 2023-08-16 ENCOUNTER — Ambulatory Visit
Admission: RE | Admit: 2023-08-16 | Discharge: 2023-08-16 | Disposition: A | Discharge status: Home / Self Care | Source: Ambulatory Visit | Attending: Radiation Oncology | Admitting: Radiation Oncology

## 2023-08-16 DIAGNOSIS — C3411 Malignant neoplasm of upper lobe, right bronchus or lung: Secondary | ICD-10-CM | POA: Diagnosis not present

## 2023-08-16 DIAGNOSIS — Z87891 Personal history of nicotine dependence: Secondary | ICD-10-CM | POA: Diagnosis not present

## 2023-08-16 LAB — RAD ONC ARIA SESSION SUMMARY
Course Elapsed Days: 2
Plan Fractions Treated to Date: 2
Plan Prescribed Dose Per Fraction: 18 Gy
Plan Total Fractions Prescribed: 3
Plan Total Prescribed Dose: 54 Gy
Reference Point Dosage Given to Date: 36 Gy
Reference Point Session Dosage Given: 18 Gy
Session Number: 2

## 2023-08-17 ENCOUNTER — Ambulatory Visit: Admitting: Radiation Oncology

## 2023-08-17 DIAGNOSIS — N2581 Secondary hyperparathyroidism of renal origin: Secondary | ICD-10-CM | POA: Diagnosis not present

## 2023-08-17 DIAGNOSIS — E876 Hypokalemia: Secondary | ICD-10-CM | POA: Diagnosis not present

## 2023-08-17 DIAGNOSIS — D631 Anemia in chronic kidney disease: Secondary | ICD-10-CM | POA: Diagnosis not present

## 2023-08-17 DIAGNOSIS — D509 Iron deficiency anemia, unspecified: Secondary | ICD-10-CM | POA: Diagnosis not present

## 2023-08-17 DIAGNOSIS — N186 End stage renal disease: Secondary | ICD-10-CM | POA: Diagnosis not present

## 2023-08-17 DIAGNOSIS — Z992 Dependence on renal dialysis: Secondary | ICD-10-CM | POA: Diagnosis not present

## 2023-08-17 DIAGNOSIS — D689 Coagulation defect, unspecified: Secondary | ICD-10-CM | POA: Diagnosis not present

## 2023-08-20 ENCOUNTER — Other Ambulatory Visit: Payer: Self-pay | Admitting: Radiation Oncology

## 2023-08-20 ENCOUNTER — Ambulatory Visit: Admitting: Radiation Oncology

## 2023-08-20 DIAGNOSIS — Z992 Dependence on renal dialysis: Secondary | ICD-10-CM | POA: Diagnosis not present

## 2023-08-20 DIAGNOSIS — N2581 Secondary hyperparathyroidism of renal origin: Secondary | ICD-10-CM | POA: Diagnosis not present

## 2023-08-20 DIAGNOSIS — E876 Hypokalemia: Secondary | ICD-10-CM | POA: Diagnosis not present

## 2023-08-20 DIAGNOSIS — C3411 Malignant neoplasm of upper lobe, right bronchus or lung: Secondary | ICD-10-CM

## 2023-08-20 DIAGNOSIS — D689 Coagulation defect, unspecified: Secondary | ICD-10-CM | POA: Diagnosis not present

## 2023-08-20 DIAGNOSIS — N186 End stage renal disease: Secondary | ICD-10-CM | POA: Diagnosis not present

## 2023-08-20 DIAGNOSIS — D509 Iron deficiency anemia, unspecified: Secondary | ICD-10-CM | POA: Diagnosis not present

## 2023-08-20 DIAGNOSIS — D631 Anemia in chronic kidney disease: Secondary | ICD-10-CM | POA: Diagnosis not present

## 2023-08-20 NOTE — Progress Notes (Signed)
  Radiation Oncology         [phone] ________________________________  Name: Paul Price MRN: [id number]  Date: 08/16/2023  DOB: January 28, 1940  End of Treatment Note  Diagnosis:    Stage IA2, cT1bN0M0 NSCLC, adenocarcinoma of the RUL    Indication for treatment:  Curative       Radiation treatment dates:   08/14/23- present  Site/dose:   The tumor in the RUL is being treated with a course of stereotactic body radiation treatment. The patient has received two of the planned three fractions. Currently he's received 36 Gy of the planned 54 Gy, at 18 Gy per fraction.  Narrative: The patient tolerated radiation treatment relatively well.   The patient has not showns signs of acute toxicity during treatment.  Plan: The patient  will complete radiation on Tuesday next week and seems to be doing well thusfar. The patient will receive a call in about one month from the radiation oncology department and have a follow up CT scan in 6-8 weeks. We will plan routine surveillance at 6 month intervals thereafter per NCCN guidelines.     Donald KYM Husband, PAC

## 2023-08-21 ENCOUNTER — Ambulatory Visit
Admission: RE | Admit: 2023-08-21 | Discharge: 2023-08-21 | Disposition: A | Discharge status: Home / Self Care | Source: Ambulatory Visit | Attending: Radiation Oncology | Admitting: Radiation Oncology

## 2023-08-21 ENCOUNTER — Other Ambulatory Visit: Payer: Self-pay

## 2023-08-21 ENCOUNTER — Ambulatory Visit

## 2023-08-21 DIAGNOSIS — C3411 Malignant neoplasm of upper lobe, right bronchus or lung: Secondary | ICD-10-CM | POA: Diagnosis not present

## 2023-08-21 DIAGNOSIS — Z87891 Personal history of nicotine dependence: Secondary | ICD-10-CM | POA: Diagnosis not present

## 2023-08-21 DIAGNOSIS — Z510 Encounter for antineoplastic radiation therapy: Secondary | ICD-10-CM | POA: Diagnosis not present

## 2023-08-21 LAB — RAD ONC ARIA SESSION SUMMARY
Course Elapsed Days: 7
Plan Fractions Treated to Date: 3
Plan Prescribed Dose Per Fraction: 18 Gy
Plan Total Fractions Prescribed: 3
Plan Total Prescribed Dose: 54 Gy
Reference Point Dosage Given to Date: 54 Gy
Reference Point Session Dosage Given: 18 Gy
Session Number: 3

## 2023-08-22 ENCOUNTER — Encounter

## 2023-08-22 DIAGNOSIS — N2581 Secondary hyperparathyroidism of renal origin: Secondary | ICD-10-CM | POA: Diagnosis not present

## 2023-08-22 DIAGNOSIS — Z992 Dependence on renal dialysis: Secondary | ICD-10-CM | POA: Diagnosis not present

## 2023-08-22 DIAGNOSIS — D631 Anemia in chronic kidney disease: Secondary | ICD-10-CM | POA: Diagnosis not present

## 2023-08-22 DIAGNOSIS — D509 Iron deficiency anemia, unspecified: Secondary | ICD-10-CM | POA: Diagnosis not present

## 2023-08-22 DIAGNOSIS — E876 Hypokalemia: Secondary | ICD-10-CM | POA: Diagnosis not present

## 2023-08-22 DIAGNOSIS — D689 Coagulation defect, unspecified: Secondary | ICD-10-CM | POA: Diagnosis not present

## 2023-08-22 DIAGNOSIS — N186 End stage renal disease: Secondary | ICD-10-CM | POA: Diagnosis not present

## 2023-08-22 NOTE — Radiation Completion Notes (Addendum)
  Radiation Oncology         [phone] ________________________________  Name: Paul Price MRN: [id number]  Date of Service: 08/21/2023  DOB: 1940/02/02  End of Treatment Note   Diagnosis: Stage IA2, cT1bN0M0 NSCLC, adenocarcinoma of the RUL   Intent: Curative     ==========DELIVERED PLANS==========  First Treatment Date: 2023-08-14 Last Treatment Date: 2023-08-21   Plan Name: Lung_R_SBRT Site: Lung, Right Technique: SBRT/SRT-IMRT Mode: Photon Dose Per Fraction: 18 Gy Prescribed Dose (Delivered / Prescribed): 54 Gy / 54 Gy Prescribed Fxs (Delivered / Prescribed): 3 / 3     ==========ON TREATMENT VISIT DATES========== 2023-08-14, 2023-08-16, 2023-08-21   See weekly On Treatment Notes in Epic for details in the Media tab (listed as Progress notes on the On Treatment Visit Dates listed above). The patient tolerated radiation.    The patient will receive a call in about one month from the radiation oncology department and have a follow up CT scan in 6-8 weeks. We will plan routine surveillance at 6 month intervals thereafter per NCCN guidelines.       Donald KYM Husband, PAC

## 2023-08-23 ENCOUNTER — Telehealth: Payer: Self-pay

## 2023-08-23 NOTE — Telephone Encounter (Signed)
 Spoke with patient regarding missed transmission d/t disconnection w/ [employer] App. Patient states he does not know how to deal with the [employer] App and is not signed up on his phone. Will make sure to send patient a remote monitor that can be placed near bedside table. Advised patient to leave the monitor plugged in once received. Verbalized understanding.   Will continue to monitor and informed patient to call device clinic back for any further questions or concerns.

## 2023-08-24 DIAGNOSIS — D689 Coagulation defect, unspecified: Secondary | ICD-10-CM | POA: Diagnosis not present

## 2023-08-24 DIAGNOSIS — N186 End stage renal disease: Secondary | ICD-10-CM | POA: Diagnosis not present

## 2023-08-24 DIAGNOSIS — E876 Hypokalemia: Secondary | ICD-10-CM | POA: Diagnosis not present

## 2023-08-24 DIAGNOSIS — N2581 Secondary hyperparathyroidism of renal origin: Secondary | ICD-10-CM | POA: Diagnosis not present

## 2023-08-24 DIAGNOSIS — D631 Anemia in chronic kidney disease: Secondary | ICD-10-CM | POA: Diagnosis not present

## 2023-08-24 DIAGNOSIS — Z992 Dependence on renal dialysis: Secondary | ICD-10-CM | POA: Diagnosis not present

## 2023-08-24 DIAGNOSIS — D509 Iron deficiency anemia, unspecified: Secondary | ICD-10-CM | POA: Diagnosis not present

## 2023-08-27 DIAGNOSIS — E876 Hypokalemia: Secondary | ICD-10-CM | POA: Diagnosis not present

## 2023-08-27 DIAGNOSIS — N186 End stage renal disease: Secondary | ICD-10-CM | POA: Diagnosis not present

## 2023-08-27 DIAGNOSIS — D509 Iron deficiency anemia, unspecified: Secondary | ICD-10-CM | POA: Diagnosis not present

## 2023-08-27 DIAGNOSIS — N2581 Secondary hyperparathyroidism of renal origin: Secondary | ICD-10-CM | POA: Diagnosis not present

## 2023-08-27 DIAGNOSIS — Z992 Dependence on renal dialysis: Secondary | ICD-10-CM | POA: Diagnosis not present

## 2023-08-27 DIAGNOSIS — D631 Anemia in chronic kidney disease: Secondary | ICD-10-CM | POA: Diagnosis not present

## 2023-08-27 DIAGNOSIS — D689 Coagulation defect, unspecified: Secondary | ICD-10-CM | POA: Diagnosis not present

## 2023-08-28 NOTE — Telephone Encounter (Signed)
 New monitor has been sent to pt

## 2023-08-29 DIAGNOSIS — D509 Iron deficiency anemia, unspecified: Secondary | ICD-10-CM | POA: Diagnosis not present

## 2023-08-29 DIAGNOSIS — D689 Coagulation defect, unspecified: Secondary | ICD-10-CM | POA: Diagnosis not present

## 2023-08-29 DIAGNOSIS — N2581 Secondary hyperparathyroidism of renal origin: Secondary | ICD-10-CM | POA: Diagnosis not present

## 2023-08-29 DIAGNOSIS — E876 Hypokalemia: Secondary | ICD-10-CM | POA: Diagnosis not present

## 2023-08-29 DIAGNOSIS — Z992 Dependence on renal dialysis: Secondary | ICD-10-CM | POA: Diagnosis not present

## 2023-08-29 DIAGNOSIS — D631 Anemia in chronic kidney disease: Secondary | ICD-10-CM | POA: Diagnosis not present

## 2023-08-29 DIAGNOSIS — N186 End stage renal disease: Secondary | ICD-10-CM | POA: Diagnosis not present

## 2023-08-31 DIAGNOSIS — Z992 Dependence on renal dialysis: Secondary | ICD-10-CM | POA: Diagnosis not present

## 2023-08-31 DIAGNOSIS — D631 Anemia in chronic kidney disease: Secondary | ICD-10-CM | POA: Diagnosis not present

## 2023-08-31 DIAGNOSIS — D509 Iron deficiency anemia, unspecified: Secondary | ICD-10-CM | POA: Diagnosis not present

## 2023-08-31 DIAGNOSIS — E876 Hypokalemia: Secondary | ICD-10-CM | POA: Diagnosis not present

## 2023-08-31 DIAGNOSIS — N186 End stage renal disease: Secondary | ICD-10-CM | POA: Diagnosis not present

## 2023-08-31 DIAGNOSIS — N2581 Secondary hyperparathyroidism of renal origin: Secondary | ICD-10-CM | POA: Diagnosis not present

## 2023-08-31 DIAGNOSIS — D689 Coagulation defect, unspecified: Secondary | ICD-10-CM | POA: Diagnosis not present

## 2023-09-03 DIAGNOSIS — D631 Anemia in chronic kidney disease: Secondary | ICD-10-CM | POA: Diagnosis not present

## 2023-09-03 DIAGNOSIS — Z992 Dependence on renal dialysis: Secondary | ICD-10-CM | POA: Diagnosis not present

## 2023-09-03 DIAGNOSIS — D509 Iron deficiency anemia, unspecified: Secondary | ICD-10-CM | POA: Diagnosis not present

## 2023-09-03 DIAGNOSIS — N186 End stage renal disease: Secondary | ICD-10-CM | POA: Diagnosis not present

## 2023-09-03 DIAGNOSIS — E876 Hypokalemia: Secondary | ICD-10-CM | POA: Diagnosis not present

## 2023-09-03 DIAGNOSIS — D689 Coagulation defect, unspecified: Secondary | ICD-10-CM | POA: Diagnosis not present

## 2023-09-03 DIAGNOSIS — N2581 Secondary hyperparathyroidism of renal origin: Secondary | ICD-10-CM | POA: Diagnosis not present

## 2023-09-05 DIAGNOSIS — D631 Anemia in chronic kidney disease: Secondary | ICD-10-CM | POA: Diagnosis not present

## 2023-09-05 DIAGNOSIS — E876 Hypokalemia: Secondary | ICD-10-CM | POA: Diagnosis not present

## 2023-09-05 DIAGNOSIS — N2581 Secondary hyperparathyroidism of renal origin: Secondary | ICD-10-CM | POA: Diagnosis not present

## 2023-09-05 DIAGNOSIS — D689 Coagulation defect, unspecified: Secondary | ICD-10-CM | POA: Diagnosis not present

## 2023-09-05 DIAGNOSIS — D509 Iron deficiency anemia, unspecified: Secondary | ICD-10-CM | POA: Diagnosis not present

## 2023-09-05 DIAGNOSIS — Z992 Dependence on renal dialysis: Secondary | ICD-10-CM | POA: Diagnosis not present

## 2023-09-05 DIAGNOSIS — N186 End stage renal disease: Secondary | ICD-10-CM | POA: Diagnosis not present

## 2023-09-07 DIAGNOSIS — D631 Anemia in chronic kidney disease: Secondary | ICD-10-CM | POA: Diagnosis not present

## 2023-09-07 DIAGNOSIS — E876 Hypokalemia: Secondary | ICD-10-CM | POA: Diagnosis not present

## 2023-09-07 DIAGNOSIS — N2581 Secondary hyperparathyroidism of renal origin: Secondary | ICD-10-CM | POA: Diagnosis not present

## 2023-09-07 DIAGNOSIS — Z992 Dependence on renal dialysis: Secondary | ICD-10-CM | POA: Diagnosis not present

## 2023-09-07 DIAGNOSIS — D509 Iron deficiency anemia, unspecified: Secondary | ICD-10-CM | POA: Diagnosis not present

## 2023-09-07 DIAGNOSIS — N186 End stage renal disease: Secondary | ICD-10-CM | POA: Diagnosis not present

## 2023-09-07 DIAGNOSIS — D689 Coagulation defect, unspecified: Secondary | ICD-10-CM | POA: Diagnosis not present

## 2023-09-09 DIAGNOSIS — Z992 Dependence on renal dialysis: Secondary | ICD-10-CM | POA: Diagnosis not present

## 2023-09-09 DIAGNOSIS — I129 Hypertensive chronic kidney disease with stage 1 through stage 4 chronic kidney disease, or unspecified chronic kidney disease: Secondary | ICD-10-CM | POA: Diagnosis not present

## 2023-09-09 DIAGNOSIS — N186 End stage renal disease: Secondary | ICD-10-CM | POA: Diagnosis not present

## 2023-09-10 DIAGNOSIS — E876 Hypokalemia: Secondary | ICD-10-CM | POA: Diagnosis not present

## 2023-09-10 DIAGNOSIS — N2581 Secondary hyperparathyroidism of renal origin: Secondary | ICD-10-CM | POA: Diagnosis not present

## 2023-09-10 DIAGNOSIS — D509 Iron deficiency anemia, unspecified: Secondary | ICD-10-CM | POA: Diagnosis not present

## 2023-09-10 DIAGNOSIS — Z992 Dependence on renal dialysis: Secondary | ICD-10-CM | POA: Diagnosis not present

## 2023-09-10 DIAGNOSIS — D631 Anemia in chronic kidney disease: Secondary | ICD-10-CM | POA: Diagnosis not present

## 2023-09-10 DIAGNOSIS — N186 End stage renal disease: Secondary | ICD-10-CM | POA: Diagnosis not present

## 2023-09-10 DIAGNOSIS — D689 Coagulation defect, unspecified: Secondary | ICD-10-CM | POA: Diagnosis not present

## 2023-09-12 DIAGNOSIS — N2581 Secondary hyperparathyroidism of renal origin: Secondary | ICD-10-CM | POA: Diagnosis not present

## 2023-09-12 DIAGNOSIS — E876 Hypokalemia: Secondary | ICD-10-CM | POA: Diagnosis not present

## 2023-09-12 DIAGNOSIS — Z992 Dependence on renal dialysis: Secondary | ICD-10-CM | POA: Diagnosis not present

## 2023-09-12 DIAGNOSIS — D631 Anemia in chronic kidney disease: Secondary | ICD-10-CM | POA: Diagnosis not present

## 2023-09-12 DIAGNOSIS — D509 Iron deficiency anemia, unspecified: Secondary | ICD-10-CM | POA: Diagnosis not present

## 2023-09-12 DIAGNOSIS — D689 Coagulation defect, unspecified: Secondary | ICD-10-CM | POA: Diagnosis not present

## 2023-09-12 DIAGNOSIS — N186 End stage renal disease: Secondary | ICD-10-CM | POA: Diagnosis not present

## 2023-09-14 DIAGNOSIS — N186 End stage renal disease: Secondary | ICD-10-CM | POA: Diagnosis not present

## 2023-09-14 DIAGNOSIS — D631 Anemia in chronic kidney disease: Secondary | ICD-10-CM | POA: Diagnosis not present

## 2023-09-14 DIAGNOSIS — D689 Coagulation defect, unspecified: Secondary | ICD-10-CM | POA: Diagnosis not present

## 2023-09-14 DIAGNOSIS — D509 Iron deficiency anemia, unspecified: Secondary | ICD-10-CM | POA: Diagnosis not present

## 2023-09-14 DIAGNOSIS — Z992 Dependence on renal dialysis: Secondary | ICD-10-CM | POA: Diagnosis not present

## 2023-09-14 DIAGNOSIS — N2581 Secondary hyperparathyroidism of renal origin: Secondary | ICD-10-CM | POA: Diagnosis not present

## 2023-09-14 DIAGNOSIS — E876 Hypokalemia: Secondary | ICD-10-CM | POA: Diagnosis not present

## 2023-09-17 ENCOUNTER — Other Ambulatory Visit (HOSPITAL_COMMUNITY): Payer: Self-pay

## 2023-09-17 DIAGNOSIS — N186 End stage renal disease: Secondary | ICD-10-CM | POA: Diagnosis not present

## 2023-09-17 DIAGNOSIS — N2581 Secondary hyperparathyroidism of renal origin: Secondary | ICD-10-CM | POA: Diagnosis not present

## 2023-09-17 DIAGNOSIS — D631 Anemia in chronic kidney disease: Secondary | ICD-10-CM | POA: Diagnosis not present

## 2023-09-17 DIAGNOSIS — Z992 Dependence on renal dialysis: Secondary | ICD-10-CM | POA: Diagnosis not present

## 2023-09-17 DIAGNOSIS — D689 Coagulation defect, unspecified: Secondary | ICD-10-CM | POA: Diagnosis not present

## 2023-09-17 DIAGNOSIS — D509 Iron deficiency anemia, unspecified: Secondary | ICD-10-CM | POA: Diagnosis not present

## 2023-09-17 DIAGNOSIS — E876 Hypokalemia: Secondary | ICD-10-CM | POA: Diagnosis not present

## 2023-09-18 ENCOUNTER — Other Ambulatory Visit: Payer: Self-pay

## 2023-09-19 ENCOUNTER — Other Ambulatory Visit (HOSPITAL_COMMUNITY): Payer: Self-pay

## 2023-09-19 DIAGNOSIS — Z992 Dependence on renal dialysis: Secondary | ICD-10-CM | POA: Diagnosis not present

## 2023-09-19 DIAGNOSIS — E876 Hypokalemia: Secondary | ICD-10-CM | POA: Diagnosis not present

## 2023-09-19 DIAGNOSIS — D509 Iron deficiency anemia, unspecified: Secondary | ICD-10-CM | POA: Diagnosis not present

## 2023-09-19 DIAGNOSIS — N186 End stage renal disease: Secondary | ICD-10-CM | POA: Diagnosis not present

## 2023-09-19 DIAGNOSIS — N2581 Secondary hyperparathyroidism of renal origin: Secondary | ICD-10-CM | POA: Diagnosis not present

## 2023-09-19 DIAGNOSIS — D631 Anemia in chronic kidney disease: Secondary | ICD-10-CM | POA: Diagnosis not present

## 2023-09-19 DIAGNOSIS — D689 Coagulation defect, unspecified: Secondary | ICD-10-CM | POA: Diagnosis not present

## 2023-09-19 MED ORDER — ATORVASTATIN CALCIUM 20 MG PO TABS
20.0000 mg | ORAL_TABLET | Freq: Every day | ORAL | 2 refills | Status: AC
  Filled 2023-09-19 – 2023-10-11 (×2): qty 90, 90d supply, fill #0
  Filled 2024-01-07: qty 90, 90d supply, fill #1

## 2023-09-21 ENCOUNTER — Telehealth: Payer: Self-pay | Admitting: *Deleted

## 2023-09-21 DIAGNOSIS — D509 Iron deficiency anemia, unspecified: Secondary | ICD-10-CM | POA: Diagnosis not present

## 2023-09-21 DIAGNOSIS — Z992 Dependence on renal dialysis: Secondary | ICD-10-CM | POA: Diagnosis not present

## 2023-09-21 DIAGNOSIS — N186 End stage renal disease: Secondary | ICD-10-CM | POA: Diagnosis not present

## 2023-09-21 DIAGNOSIS — D631 Anemia in chronic kidney disease: Secondary | ICD-10-CM | POA: Diagnosis not present

## 2023-09-21 DIAGNOSIS — N2581 Secondary hyperparathyroidism of renal origin: Secondary | ICD-10-CM | POA: Diagnosis not present

## 2023-09-21 DIAGNOSIS — D689 Coagulation defect, unspecified: Secondary | ICD-10-CM | POA: Diagnosis not present

## 2023-09-21 DIAGNOSIS — E876 Hypokalemia: Secondary | ICD-10-CM | POA: Diagnosis not present

## 2023-09-21 NOTE — Telephone Encounter (Signed)
 CALLED PATIENT TO INFORM OF CT SCAN FOR 10-04-23- ARRIVAL TIME- 10:15 AM @ [facility], NO RESTRICTIONS TO SCAN, SPOKE WITH PATIENT AND HE IS AWARE OF THIS SCAN AND THE INSTRUCTIONS

## 2023-09-24 ENCOUNTER — Ambulatory Visit
Admission: RE | Admit: 2023-09-24 | Discharge: 2023-09-24 | Disposition: A | Discharge status: Home / Self Care | Source: Ambulatory Visit | Attending: Radiation Oncology | Admitting: Radiation Oncology

## 2023-09-24 DIAGNOSIS — D509 Iron deficiency anemia, unspecified: Secondary | ICD-10-CM | POA: Diagnosis not present

## 2023-09-24 DIAGNOSIS — Z87891 Personal history of nicotine dependence: Secondary | ICD-10-CM | POA: Insufficient documentation

## 2023-09-24 DIAGNOSIS — Z992 Dependence on renal dialysis: Secondary | ICD-10-CM | POA: Diagnosis not present

## 2023-09-24 DIAGNOSIS — D631 Anemia in chronic kidney disease: Secondary | ICD-10-CM | POA: Diagnosis not present

## 2023-09-24 DIAGNOSIS — C3411 Malignant neoplasm of upper lobe, right bronchus or lung: Secondary | ICD-10-CM | POA: Insufficient documentation

## 2023-09-24 DIAGNOSIS — E876 Hypokalemia: Secondary | ICD-10-CM | POA: Diagnosis not present

## 2023-09-24 DIAGNOSIS — N2581 Secondary hyperparathyroidism of renal origin: Secondary | ICD-10-CM | POA: Diagnosis not present

## 2023-09-24 DIAGNOSIS — D689 Coagulation defect, unspecified: Secondary | ICD-10-CM | POA: Diagnosis not present

## 2023-09-24 DIAGNOSIS — N186 End stage renal disease: Secondary | ICD-10-CM | POA: Diagnosis not present

## 2023-09-24 NOTE — Progress Notes (Signed)
  Radiation Oncology         [phone] ________________________________  Name: Paul Price MRN: [id number]  Date of Service: 09/24/2023  DOB: 08/23/1940  Post Treatment Telephone Note  Diagnosis:  Stage IA2, cT1bN0M0 NSCLC, adenocarcinoma of the RUL   The patient was available for call today.   Symptoms of fatigue have improved since completing therapy.  He reports some episodes of feeling sluggish but he reports he works right thru it.  Symptoms of skin changes have improved since completing therapy.  Symptoms of esophagitis have improved since completing therapy.  He denies any cough or SOB.  The patient is scheduled for CT Chest 10/04/2023 and was encouraged to call if he develops concerns or questions regarding radiation.

## 2023-09-26 DIAGNOSIS — D631 Anemia in chronic kidney disease: Secondary | ICD-10-CM | POA: Diagnosis not present

## 2023-09-26 DIAGNOSIS — N186 End stage renal disease: Secondary | ICD-10-CM | POA: Diagnosis not present

## 2023-09-26 DIAGNOSIS — D689 Coagulation defect, unspecified: Secondary | ICD-10-CM | POA: Diagnosis not present

## 2023-09-26 DIAGNOSIS — Z992 Dependence on renal dialysis: Secondary | ICD-10-CM | POA: Diagnosis not present

## 2023-09-26 DIAGNOSIS — D509 Iron deficiency anemia, unspecified: Secondary | ICD-10-CM | POA: Diagnosis not present

## 2023-09-26 DIAGNOSIS — N2581 Secondary hyperparathyroidism of renal origin: Secondary | ICD-10-CM | POA: Diagnosis not present

## 2023-09-26 DIAGNOSIS — E876 Hypokalemia: Secondary | ICD-10-CM | POA: Diagnosis not present

## 2023-09-28 DIAGNOSIS — N2581 Secondary hyperparathyroidism of renal origin: Secondary | ICD-10-CM | POA: Diagnosis not present

## 2023-09-28 DIAGNOSIS — N186 End stage renal disease: Secondary | ICD-10-CM | POA: Diagnosis not present

## 2023-09-28 DIAGNOSIS — D689 Coagulation defect, unspecified: Secondary | ICD-10-CM | POA: Diagnosis not present

## 2023-09-28 DIAGNOSIS — E876 Hypokalemia: Secondary | ICD-10-CM | POA: Diagnosis not present

## 2023-09-28 DIAGNOSIS — D509 Iron deficiency anemia, unspecified: Secondary | ICD-10-CM | POA: Diagnosis not present

## 2023-09-28 DIAGNOSIS — Z992 Dependence on renal dialysis: Secondary | ICD-10-CM | POA: Diagnosis not present

## 2023-09-28 DIAGNOSIS — D631 Anemia in chronic kidney disease: Secondary | ICD-10-CM | POA: Diagnosis not present

## 2023-10-01 DIAGNOSIS — N186 End stage renal disease: Secondary | ICD-10-CM | POA: Diagnosis not present

## 2023-10-01 DIAGNOSIS — E876 Hypokalemia: Secondary | ICD-10-CM | POA: Diagnosis not present

## 2023-10-01 DIAGNOSIS — D631 Anemia in chronic kidney disease: Secondary | ICD-10-CM | POA: Diagnosis not present

## 2023-10-01 DIAGNOSIS — Z992 Dependence on renal dialysis: Secondary | ICD-10-CM | POA: Diagnosis not present

## 2023-10-01 DIAGNOSIS — D509 Iron deficiency anemia, unspecified: Secondary | ICD-10-CM | POA: Diagnosis not present

## 2023-10-01 DIAGNOSIS — N2581 Secondary hyperparathyroidism of renal origin: Secondary | ICD-10-CM | POA: Diagnosis not present

## 2023-10-01 DIAGNOSIS — D689 Coagulation defect, unspecified: Secondary | ICD-10-CM | POA: Diagnosis not present

## 2023-10-03 ENCOUNTER — Encounter: Payer: Self-pay | Admitting: Hematology

## 2023-10-03 ENCOUNTER — Encounter (HOSPITAL_COMMUNITY): Payer: Self-pay

## 2023-10-03 DIAGNOSIS — N2581 Secondary hyperparathyroidism of renal origin: Secondary | ICD-10-CM | POA: Diagnosis not present

## 2023-10-03 DIAGNOSIS — D509 Iron deficiency anemia, unspecified: Secondary | ICD-10-CM | POA: Diagnosis not present

## 2023-10-03 DIAGNOSIS — D689 Coagulation defect, unspecified: Secondary | ICD-10-CM | POA: Diagnosis not present

## 2023-10-03 DIAGNOSIS — E876 Hypokalemia: Secondary | ICD-10-CM | POA: Diagnosis not present

## 2023-10-03 DIAGNOSIS — Z992 Dependence on renal dialysis: Secondary | ICD-10-CM | POA: Diagnosis not present

## 2023-10-03 DIAGNOSIS — D631 Anemia in chronic kidney disease: Secondary | ICD-10-CM | POA: Diagnosis not present

## 2023-10-03 DIAGNOSIS — N186 End stage renal disease: Secondary | ICD-10-CM | POA: Diagnosis not present

## 2023-10-04 ENCOUNTER — Ambulatory Visit (HOSPITAL_COMMUNITY)
Admission: RE | Admit: 2023-10-04 | Discharge: 2023-10-04 | Disposition: A | Discharge status: Home / Self Care | Source: Ambulatory Visit | Attending: Radiation Oncology | Admitting: Radiation Oncology

## 2023-10-04 DIAGNOSIS — C3411 Malignant neoplasm of upper lobe, right bronchus or lung: Secondary | ICD-10-CM | POA: Insufficient documentation

## 2023-10-04 DIAGNOSIS — R911 Solitary pulmonary nodule: Secondary | ICD-10-CM | POA: Diagnosis not present

## 2023-10-04 DIAGNOSIS — D3501 Benign neoplasm of right adrenal gland: Secondary | ICD-10-CM | POA: Diagnosis not present

## 2023-10-04 DIAGNOSIS — D3502 Benign neoplasm of left adrenal gland: Secondary | ICD-10-CM | POA: Diagnosis not present

## 2023-10-04 MED ORDER — IOHEXOL 300 MG/ML  SOLN
75.0000 mL | Freq: Once | INTRAMUSCULAR | Status: AC | PRN
  Administered 2023-10-04: 75 mL via INTRAVENOUS

## 2023-10-05 DIAGNOSIS — Z992 Dependence on renal dialysis: Secondary | ICD-10-CM | POA: Diagnosis not present

## 2023-10-05 DIAGNOSIS — D509 Iron deficiency anemia, unspecified: Secondary | ICD-10-CM | POA: Diagnosis not present

## 2023-10-05 DIAGNOSIS — D689 Coagulation defect, unspecified: Secondary | ICD-10-CM | POA: Diagnosis not present

## 2023-10-05 DIAGNOSIS — E876 Hypokalemia: Secondary | ICD-10-CM | POA: Diagnosis not present

## 2023-10-05 DIAGNOSIS — N2581 Secondary hyperparathyroidism of renal origin: Secondary | ICD-10-CM | POA: Diagnosis not present

## 2023-10-05 DIAGNOSIS — D631 Anemia in chronic kidney disease: Secondary | ICD-10-CM | POA: Diagnosis not present

## 2023-10-05 DIAGNOSIS — N186 End stage renal disease: Secondary | ICD-10-CM | POA: Diagnosis not present

## 2023-10-08 ENCOUNTER — Telehealth: Payer: Self-pay | Admitting: Radiation Oncology

## 2023-10-08 DIAGNOSIS — D509 Iron deficiency anemia, unspecified: Secondary | ICD-10-CM | POA: Diagnosis not present

## 2023-10-08 DIAGNOSIS — N2581 Secondary hyperparathyroidism of renal origin: Secondary | ICD-10-CM | POA: Diagnosis not present

## 2023-10-08 DIAGNOSIS — N186 End stage renal disease: Secondary | ICD-10-CM | POA: Diagnosis not present

## 2023-10-08 DIAGNOSIS — D631 Anemia in chronic kidney disease: Secondary | ICD-10-CM | POA: Diagnosis not present

## 2023-10-08 DIAGNOSIS — C3411 Malignant neoplasm of upper lobe, right bronchus or lung: Secondary | ICD-10-CM

## 2023-10-08 DIAGNOSIS — E876 Hypokalemia: Secondary | ICD-10-CM | POA: Diagnosis not present

## 2023-10-08 DIAGNOSIS — D689 Coagulation defect, unspecified: Secondary | ICD-10-CM | POA: Diagnosis not present

## 2023-10-08 DIAGNOSIS — Z992 Dependence on renal dialysis: Secondary | ICD-10-CM | POA: Diagnosis not present

## 2023-10-08 NOTE — Telephone Encounter (Signed)
 I called to let the patient know his recent CT scan following SBRT to the RUL was reassuring and the prominent lymph node is being followed but unchanged. We will plan a repeat scan in 6 months time. He is in agreement and given his Hemodialysis and difficult IV stick, we talked about a CT without contrast. I also spoke with his PA in nephrology and let her know he was doing well and I didn't see a reason he couldn't continue epogen  type medications with his HD.

## 2023-10-09 DIAGNOSIS — N186 End stage renal disease: Secondary | ICD-10-CM | POA: Diagnosis not present

## 2023-10-09 DIAGNOSIS — Z992 Dependence on renal dialysis: Secondary | ICD-10-CM | POA: Diagnosis not present

## 2023-10-09 DIAGNOSIS — I129 Hypertensive chronic kidney disease with stage 1 through stage 4 chronic kidney disease, or unspecified chronic kidney disease: Secondary | ICD-10-CM | POA: Diagnosis not present

## 2023-10-11 ENCOUNTER — Other Ambulatory Visit (HOSPITAL_COMMUNITY): Payer: Self-pay

## 2023-10-17 ENCOUNTER — Other Ambulatory Visit (HOSPITAL_COMMUNITY): Payer: Self-pay

## 2023-10-18 ENCOUNTER — Other Ambulatory Visit (HOSPITAL_BASED_OUTPATIENT_CLINIC_OR_DEPARTMENT_OTHER): Payer: Self-pay

## 2023-11-06 ENCOUNTER — Encounter (HOSPITAL_COMMUNITY): Payer: Self-pay

## 2023-11-07 ENCOUNTER — Ambulatory Visit: Attending: Cardiology

## 2023-11-07 DIAGNOSIS — I442 Atrioventricular block, complete: Secondary | ICD-10-CM

## 2023-11-08 LAB — CUP PACEART REMOTE DEVICE CHECK
Battery Remaining Longevity: 9 mo
Battery Voltage: 2.77 V
Brady Statistic RA Percent Paced: 0.84 %
Brady Statistic RV Percent Paced: 99.03 %
Date Time Interrogation Session: 20251029110609
Implantable Lead Connection Status: 753985
Implantable Lead Connection Status: 753985
Implantable Lead Implant Date: 20181217
Implantable Lead Implant Date: 20181217
Implantable Lead Location: 753859
Implantable Lead Location: 753859
Implantable Lead Model: 3830
Implantable Lead Model: 5076
Implantable Pulse Generator Implant Date: 20181217
Lead Channel Impedance Value: 304 Ohm
Lead Channel Impedance Value: 323 Ohm
Lead Channel Impedance Value: 399 Ohm
Lead Channel Impedance Value: 456 Ohm
Lead Channel Pacing Threshold Amplitude: 0.5 V
Lead Channel Pacing Threshold Amplitude: 0.75 V
Lead Channel Pacing Threshold Pulse Width: 0.4 ms
Lead Channel Pacing Threshold Pulse Width: 0.4 ms
Lead Channel Sensing Intrinsic Amplitude: 0.125 mV
Lead Channel Sensing Intrinsic Amplitude: 0.125 mV
Lead Channel Sensing Intrinsic Amplitude: 12.875 mV
Lead Channel Sensing Intrinsic Amplitude: 12.875 mV
Lead Channel Setting Pacing Amplitude: 2 V
Lead Channel Setting Pacing Amplitude: 2.5 V
Lead Channel Setting Pacing Pulse Width: 1 ms
Lead Channel Setting Sensing Sensitivity: 1.2 mV
Zone Setting Status: 755011
Zone Setting Status: 755011

## 2023-11-09 ENCOUNTER — Encounter (HOSPITAL_COMMUNITY)
Admission: RE | Disposition: A | Discharge status: Home / Self Care | Payer: Self-pay | Source: Home / Self Care | Attending: Vascular Surgery

## 2023-11-09 ENCOUNTER — Ambulatory Visit (HOSPITAL_COMMUNITY)
Admission: RE | Admit: 2023-11-09 | Discharge: 2023-11-09 | Disposition: A | Discharge status: Home / Self Care | Attending: Vascular Surgery | Admitting: Vascular Surgery

## 2023-11-09 ENCOUNTER — Other Ambulatory Visit: Payer: Self-pay

## 2023-11-09 DIAGNOSIS — N186 End stage renal disease: Secondary | ICD-10-CM | POA: Diagnosis not present

## 2023-11-09 DIAGNOSIS — Y832 Surgical operation with anastomosis, bypass or graft as the cause of abnormal reaction of the patient, or of later complication, without mention of misadventure at the time of the procedure: Secondary | ICD-10-CM | POA: Diagnosis not present

## 2023-11-09 DIAGNOSIS — T82858A Stenosis of vascular prosthetic devices, implants and grafts, initial encounter: Secondary | ICD-10-CM | POA: Diagnosis present

## 2023-11-09 DIAGNOSIS — I120 Hypertensive chronic kidney disease with stage 5 chronic kidney disease or end stage renal disease: Secondary | ICD-10-CM | POA: Insufficient documentation

## 2023-11-09 DIAGNOSIS — Z992 Dependence on renal dialysis: Secondary | ICD-10-CM | POA: Diagnosis not present

## 2023-11-09 DIAGNOSIS — Z87891 Personal history of nicotine dependence: Secondary | ICD-10-CM | POA: Insufficient documentation

## 2023-11-09 HISTORY — PX: VENOUS ANGIOPLASTY: CATH118376

## 2023-11-09 HISTORY — PX: A/V FISTULAGRAM: CATH118298

## 2023-11-09 MED ORDER — LIDOCAINE HCL (PF) 1 % IJ SOLN
INTRAMUSCULAR | Status: AC
Start: 2023-11-09 — End: 2023-11-09
  Filled 2023-11-09: qty 30

## 2023-11-09 MED ORDER — IODIXANOL 320 MG/ML IV SOLN
INTRAVENOUS | Status: DC | PRN
  Administered 2023-11-09: 40 mL via INTRAVENOUS

## 2023-11-09 MED ORDER — LIDOCAINE HCL (PF) 1 % IJ SOLN
INTRAMUSCULAR | Status: DC | PRN
  Administered 2023-11-09: 2 mL via SUBCUTANEOUS

## 2023-11-09 MED ORDER — HEPARIN SODIUM (PORCINE) 1000 UNIT/ML IJ SOLN
INTRAMUSCULAR | Status: DC | PRN
  Administered 2023-11-09: 4000 [IU] via INTRAVENOUS

## 2023-11-09 MED ORDER — HEPARIN (PORCINE) IN NACL 1000-0.9 UT/500ML-% IV SOLN
INTRAVENOUS | Status: DC | PRN
  Administered 2023-11-09: 500 mL

## 2023-11-09 MED ORDER — HEPARIN SODIUM (PORCINE) 1000 UNIT/ML IJ SOLN
INTRAMUSCULAR | Status: AC
  Filled 2023-11-09: qty 10

## 2023-11-09 NOTE — H&P (Signed)
 Hospital Consult    Reason for Consult: End-stage renal disease with fistula malfunction Requesting Physician: Nephrology MRN #:  [id number]  History of Present Illness: This is a 83 y.o. male who presents today with end-stage renal disease and fistula malfunction.  The patient has a right arm brachiobasilic fistula which has undergone multiple interventions due to low flow.  His last intervention was in July and involved balloon venoplasty of the proximal portion of the fistula immediately distal to the anastomosis.  Patient presents today accompanied by his wife.  On exam he was doing well.  States that he is able to use the fistula, however they are having more more trouble.  In most cases, they have to use a tourniquet for access  Past Medical History:  Diagnosis Date   Arthritis    shoulders (09/07/2016)   Atrial fibrillation (HCC)    BPH (benign prostatic hyperplasia)    Chronic kidney disease (CKD), stage III (moderate) (HCC)    Coronary artery disease    DDD (degenerative disc disease), cervical    DVT (deep venous thrombosis) (HCC) 12/2013   LLE   GERD (gastroesophageal reflux disease)    Gout    High cholesterol    History of blood transfusion 09/06/2016   2 u PRBC   History of scarlet fever 1940s   History of stress test 06/2011   No significant ischemia, this is a low risk scan. Clinical correlation recommended Abnormal myocardial perfusion study.   Hx of echocardiogram 05/2009   EF 40-45%, he did have mild annular calcification with mild-to-moderate MR and mild TR as well as aortic valve sclerosis. Estimated RV systolic pressure was 21 mm.   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   Neck pain    not chronic (09/07/2016)   Obesity    OSA on CPAP    Presence of permanent cardiac pacemaker    RBBB    Renal insufficiency    Right renal mass    Spinal stenosis of lumbar region     Past Surgical  History:  Procedure Laterality Date   A/V FISTULAGRAM N/A 01/17/2023   Procedure: A/V Fistulagram;  Surgeon: Melia Lynwood ORN, MD;  Location: [facility];  Service: Cardiovascular;  Laterality: N/A;   A/V SHUNT INTERVENTION Right 07/23/2023   Procedure: A/V SHUNT INTERVENTION;  Surgeon: Lanis Fonda BRAVO, MD;  Location: [facility];  Service: Cardiovascular;  Laterality: Right;   APPENDECTOMY     AV FISTULA PLACEMENT Right 02/09/2020   Procedure: ARTERIOVENOUS (AV) FISTULA  CREATION RIGHT UPPER EXTREMITY;  Surgeon: Gretta Lonni PARAS, MD;  Location: [facility] OR;  Service: Vascular;  Laterality: Right;   BASCILIC VEIN TRANSPOSITION Right 03/30/2020   Procedure: RIGHT SECOND STAGE BASCILIC VEIN TRANSPOSITION;  Surgeon: Sheree Penne Lonni, MD;  Location: [facility] OR;  Service: Vascular;  Laterality: Right;   CARDIAC CATHETERIZATION  2009   he was found to have a atretic LIMA graft to his LAD and had a patent vein graft supplying his diagonal vessel. At that time, he had 70% narrowing in his LAD beyond a diagonal vessel which was initially treated medically.   COLONOSCOPY WITH PROPOFOL  Left 09/09/2016   Procedure: COLONOSCOPY WITH PROPOFOL ;  Surgeon: Burnette Fallow, MD;  Location: [facility] ENDOSCOPY;  Service: Endoscopy;  Laterality: Left;   CORONARY ANGIOPLASTY WITH STENT PLACEMENT  April 2012   LAD DES   CORONARY ARTERY BYPASS GRAFT  1992   with LIMA to the LAD and diagonal.   CYSTOSCOPY N/A 03/31/2019   Procedure: CYSTOSCOPY FLEXIBLE;  Surgeon: Carolee Sherwood JONETTA DOUGLAS, MD;  Location: [facility];  Service: Urology;  Laterality: N/A;   CYSTOSCOPY WITH URETHRAL DILATATION N/A 05/21/2019   Procedure: CYSTOSCOPY WITH URETHRAL BALLOON DILATATION;  Surgeon: Carolee Sherwood JONETTA DOUGLAS, MD;  Location: [facility];  Service: Urology;  Laterality: N/A;   ESOPHAGOGASTRODUODENOSCOPY N/A 11/16/2019   Procedure: ESOPHAGOGASTRODUODENOSCOPY (EGD);  Surgeon: Dianna Specking, MD;  Location: [facility] ENDOSCOPY;  Service: Endoscopy;  Laterality: N/A;    ESOPHAGOGASTRODUODENOSCOPY (EGD) WITH PROPOFOL  Left 09/08/2016   Procedure: ESOPHAGOGASTRODUODENOSCOPY (EGD) WITH PROPOFOL ;  Surgeon: Saintclair Jasper, MD;  Location: [facility]COPY;  Service: Gastroenterology;  Laterality: Left;   EXCHANGE OF A DIALYSIS CATHETER Right 02/09/2020   Procedure: EXCHANGE OF A DIALYSIS CATHETER;  Surgeon: Gretta Lonni PARAS, MD;  Location: [facility] OR;  Service: Vascular;  Laterality: Right;   GIVENS CAPSULE STUDY N/A 11/16/2019   Procedure: GIVENS CAPSULE STUDY;  Surgeon: Dianna Specking, MD;  Location: [facility] ENDOSCOPY;  Service: Endoscopy;  Laterality: N/A;   INGUINAL HERNIA REPAIR     don't remeimber which side   IR FLUORO GUIDE CV LINE RIGHT  02/05/2020   IR US  GUIDE VASC ACCESS RIGHT  02/05/2020   LAPAROSCOPIC NEPHRECTOMY Right 03/31/2019   Procedure: RIGHT  HAND ASSIST LAPAROSCOPIC NEPHRECTOMY;  Surgeon: Carolee Sherwood JONETTA DOUGLAS, MD;  Location: [facility];  Service: Urology;  Laterality: Right;   PACEMAKER IMPLANT N/A 12/25/2016   Procedure: PACEMAKER IMPLANT;  Surgeon: Fernande Elspeth BROCKS, MD;  Location: [facility] INVASIVE CV LAB;  Service: Cardiovascular;  Laterality: N/A;   PERIPHERAL VASCULAR BALLOON ANGIOPLASTY Right 01/17/2023   Procedure: PERIPHERAL VASCULAR BALLOON ANGIOPLASTY;  Surgeon: Melia Lynwood ORN, MD;  Location: [facility];  Service: Cardiovascular;  Laterality: Right;  basilic inflow   TEE WITHOUT CARDIOVERSION N/A 02/06/2020   Procedure: TRANSESOPHAGEAL ECHOCARDIOGRAM (TEE);  Surgeon: Raford Riggs, MD;  Location: [facility] ENDOSCOPY;  Service: Cardiovascular;  Laterality: N/A;   TONSILLECTOMY     VIDEO BRONCHOSCOPY WITH ENDOBRONCHIAL NAVIGATION Right 07/17/2023   Procedure: VIDEO BRONCHOSCOPY WITH ENDOBRONCHIAL NAVIGATION;  Surgeon: Shelah Lamar RAMAN, MD;  Location: [facility] ENDOSCOPY;  Service: Pulmonary;  Laterality: Right;    Allergies  Allergen Reactions   Gabapentin  Other (See Comments)    Dizziness Loss of mental capacity   Simvastatin Other (See Comments)    Abdominal cramps    Nitrostat [Nitroglycerin] Other (See Comments)    Causes blood pressure to bottom out    Prior to Admission medications   Medication Sig Start Date End Date Taking? Authorizing Provider  acetaminophen  (TYLENOL ) 500 MG tablet Take 1,500-2,000 mg by mouth daily as needed for mild pain (pain score 1-3), moderate pain (pain score 4-6) or headache.    [provider]  allopurinol  (ZYLOPRIM ) 300 MG tablet Take 300 mg by mouth daily.    [provider]  allopurinol  (ZYLOPRIM ) 300 MG tablet Take 1 tablet (300 mg total) by mouth daily. Patient not taking: Reported on 07/26/2023 01/05/23   Clarice Nottingham, MD  aspirin  EC 81 MG tablet Take 81 mg by mouth daily. Swallow whole.    [provider]  atorvastatin  (LIPITOR) 20 MG tablet Take 1 tablet (20 mg total) by mouth daily. 01/05/23     atorvastatin  (LIPITOR) 20 MG tablet Take 1 tablet (20 mg total) by mouth daily. 09/19/23   Swinyer, Rosaline HERO, NP  B Complex-C (B-COMPLEX WITH VITAMIN C) tablet Take 1 tablet  by mouth daily.    [provider]  gabapentin  (NEURONTIN ) 100 MG capsule Take 1 capsule (100 mg total) by mouth at bedtime. 12/26/22     gabapentin  (NEURONTIN ) 300 MG capsule Take 1 capsule (300 mg total) by mouth at bedtime. 11/22/22     loperamide (IMODIUM A-D) 2 MG tablet Take 2 mg by mouth as needed for diarrhea or loose stools.    [provider]  midodrine  (PROAMATINE ) 2.5 MG tablet TAKE 1 TABLET BY MOUTH IMMEDIATELY AFTER DIALYSIS THEN ANOTHER TABLET 4 HOURS LATER 10/05/22   Fernande Elspeth BROCKS, MD  omeprazole (PRILOSEC OTC) 20 MG tablet Take 20 mg by mouth daily.    [provider]  pantoprazole  (PROTONIX ) 40 MG tablet Take 40 mg by mouth daily.    [provider]  pantoprazole  (PROTONIX ) 40 MG tablet Take 1 tablet (40 mg total) by mouth daily. Patient not taking: Reported on 07/26/2023 11/14/22       Social History   Socioeconomic History   Marital status: Married    Spouse  name: Not on file   Number of children: 3   Years of education: Not on file   Highest education level: Not on file  Occupational History   Not on file  Tobacco Use   Smoking status: Former    Current packs/day: 0.00    Average packs/day: 1 pack/day for 30.0 years (30.0 ttl pk-yrs)    Types: Cigarettes    Quit date: 66    Years since quitting: 40.8   Smokeless tobacco: Never   Tobacco comments:    quit ~ 1985  Vaping Use   Vaping status: Never Used  Substance and Sexual Activity   Alcohol use: Not Currently    Alcohol/week: 0.0 standard drinks of alcohol    Comment: rare beer   Drug use: No   Sexual activity: Not Currently  Other Topics Concern   Not on file  Social History Narrative   Not on file   Social Drivers of Health   Financial Resource Strain: Not on file  Food Insecurity: No Food Insecurity (07/27/2023)   Hunger Vital Sign    Worried About Running Out of Food in the Last Year: Never true    Ran Out of Food in the Last Year: Never true  Transportation Needs: No Transportation Needs (07/27/2023)   PRAPARE - Administrator, Civil Service (Medical): No    Lack of Transportation (Non-Medical): No  Physical Activity: Not on file  Stress: Not on file  Social Connections: Not on file  Intimate Partner Violence: Not At Risk (07/27/2023)   Humiliation, Afraid, Rape, and Kick questionnaire    Fear of Current or Ex-Partner: No    Emotionally Abused: No    Physically Abused: No    Sexually Abused: No   Family History  Problem Relation Age of Onset   Heart disease Father     ROS: Otherwise negative unless mentioned in HPI  Physical Examination  Vitals:   11/09/23 1043 11/09/23 1059  BP: (!) 147/67 134/70  Pulse: 60 77  Resp: (!) 21 12  Temp:    SpO2: 99% 99%   Body mass index is 24.21 kg/m.  General:  WDWN in NAD Gait: Not observed HENT: WNL, normocephalic Pulmonary: normal non-labored breathing, without Rales, rhonchi,  wheezing Cardiac:  regular, without  Murmurs, rubs or gallops; without carotid bruits Abdomen: soft, NT/ND, no masses Skin: without rashes Vascular Exam/Pulses: Light thrill in the right arm brachiobasilic fistula  Extremities: without ischemic changes, without Gangrene , without cellulitis; without open wounds;  Musculoskeletal: no muscle wasting or atrophy  Neurologic: A&O X 3;  No focal weakness or paresthesias are detected; speech is fluent/normal Psychiatric:  The pt has Normal affect. Lymph:  Unremarkable  CBC    Component Value Date/Time   WBC 9.4 02/12/2020 0149   RBC 3.26 (L) 02/12/2020 0149   HGB 12.9 (L) 07/17/2023 0735   HGB 7.7 (L) 10/20/2019 0924   HCT 38.0 (L) 07/17/2023 0735   HCT 39.9 10/01/2018 1130   PLT 141 (L) 02/12/2020 0149   PLT 146 (L) 10/20/2019 0924   MCV 85.6 02/12/2020 0149   MCH 26.7 02/12/2020 0149   MCHC 31.2 02/12/2020 0149   RDW 22.4 (H) 02/12/2020 0149   LYMPHSABS 0.7 02/01/2020 1618   MONOABS 1.0 02/01/2020 1618   EOSABS 0.1 02/01/2020 1618   BASOSABS 0.0 02/01/2020 1618    BMET    Component Value Date/Time   NA 140 07/17/2023 0735   NA 146 (H) 11/22/2016 1057   K 4.7 07/17/2023 0735   CL 98 07/17/2023 0735   CO2 22 02/12/2020 0149   GLUCOSE 86 07/17/2023 0735   BUN 40 (H) 07/17/2023 0735   BUN 44 (H) 11/22/2016 1057   CREATININE 6.50 (H) 07/17/2023 0735   CREATININE 3.18 (HH) 10/20/2019 0924   CREATININE 1.79 (H) 10/27/2015 0803   CALCIUM  7.6 (L) 02/12/2020 0149   GFRNONAA 16 (L) 02/12/2020 0149   GFRNONAA 17 (L) 10/20/2019 0924   GFRNONAA 36 (L) 10/27/2015 0803   GFRAA 20 (L) 08/12/2019 0959   GFRAA 41 (L) 10/27/2015 0803    COAGS: Lab Results  Component Value Date   INR 1.6 (H) 02/09/2020   INR 2.6 (H) 11/14/2019   INR 1.8 (H) 03/21/2019      ASSESSMENT/PLAN: This is a 83 y.o. male who is undergoing multiple access procedures to the right arm brachiobasilic fistula.  The last access was 3 months ago.  On exam, there is a light  thrill.  With low flow issues, I think it is reasonable to perform a fistulogram to assess the fistula, and attempt to improve inflow.  This has been done previously with 6 mm and 8 mm balloons.  He is aware that if interventions become monthly, the fistula will likely need to be abandoned, [facility] placed and new access made.  At this time, I think fistula salvage is in his best interest.  After discussing the risks and benefits of right arm fistulogram with possible intervention to improve flow for continued use for dialysis, [place] elected to proceed.   Fonda FORBES Rim MD MS [facility] [phone] 11/09/2023  11:00 AM

## 2023-11-09 NOTE — Op Note (Signed)
    [name] MRN: [id number] DOB: 03-13-1940 Sex: male  11/09/2023 Pre-operative Diagnosis: Right arm fistula malfunction Post-operative diagnosis:  Same Surgeon:  Fonda FORBES Rim, MD Procedure Performed: 1.  Ultrasound-guided micropuncture access of the right arm brachiobasilic fistula 2.  Fistulogram 3.  Basilic vein balloon venoplasty 5 x 80, 8 x 40   Indications: Patient is an 83 year old male with history of end-stage renal disease currently dialyzed through a right sided brachiobasilic fistula.  He has had issues with his access, namely low flow.  After discussing the risks and benefits of fistulogram in an effort to improve flow, [name] elected to proceed  Findings:  Widely patent arterial anastomosis Severe, greater than 80% tapering stenosis roughly 7 cm in length immediately distal to the anastomosis. Mid brachiobasilic has 50% stenosis Widely patent outflow.  Procedure:  The patient was identified in the holding area and taken to room 8.  The patient was then placed supine on the table and prepped and draped in the usual sterile fashion.  A time out was called.    Ultrasound was used to evaluate the right arm brachiocephalic fistula.  The patient was accessed using an ultrasound-guided micropuncture needle and a wire placed in the fistula.  This was exchanged for a micropuncture sheath and fistulogram followed.  See results above.  I elected to intervene.  The patient was administered heparin , and the micropuncture sheath was exchanged for a 6 French sheath.  A wire was run into the native arterial system, and a 5 x 80 mm balloon brought into the field, laid across the 7 cm tapering stenosis and inflated.  Follow-up angiography demonstrated significant improvement with a thrill within the fistula.  There was 50% stenosis in the mid brachiobasilic fistula.  I used an 8 x 40 mm balloon with resolution of flow-limiting stenosis to less than 20%.  I was happy with  this result.  Access was managed with Monocryl suture.  I had a long conversation with the patient regarding his fistula. The fistula can continue to be accessed. The fistula remains amenable to endovascular intervention as long as interventions do not become monthly.  If they become monthly, the fistula needs to be revised surgically, or abandoned.    Fonda FORBES Rim MD [facility] [place] Office: [phone]

## 2023-11-09 NOTE — Consult Note (Signed)
 Hospital Consult    Reason for Consult: End-stage renal disease with fistula malfunction Requesting Physician: Nephrology MRN #:  [id number]  History of Present Illness: This is a 83 y.o. male who presents today with end-stage renal disease and fistula malfunction.  The patient has a right arm brachiobasilic fistula which has undergone multiple interventions due to low flow.  His last intervention was in July and involved balloon venoplasty of the proximal portion of the fistula immediately distal to the anastomosis.  Patient presents today accompanied by his wife.  On exam he was doing well.  States that he is able to use the fistula, however they are having more more trouble.  In most cases, they have to use a tourniquet for access  Past Medical History:  Diagnosis Date   Arthritis    shoulders (09/07/2016)   Atrial fibrillation (HCC)    BPH (benign prostatic hyperplasia)    Chronic kidney disease (CKD), stage III (moderate) (HCC)    Coronary artery disease    DDD (degenerative disc disease), cervical    DVT (deep venous thrombosis) (HCC) 12/2013   LLE   GERD (gastroesophageal reflux disease)    Gout    High cholesterol    History of blood transfusion 09/06/2016   2 u PRBC   History of scarlet fever 1940s   History of stress test 06/2011   No significant ischemia, this is a low risk scan. Clinical correlation recommended Abnormal myocardial perfusion study.   Hx of echocardiogram 05/2009   EF 40-45%, he did have mild annular calcification with mild-to-moderate MR and mild TR as well as aortic valve sclerosis. Estimated RV systolic pressure was 21 mm.   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   Neck pain    not chronic (09/07/2016)   Obesity    OSA on CPAP    Presence of permanent cardiac pacemaker    RBBB    Renal insufficiency    Right renal mass    Spinal stenosis of lumbar region     Past Surgical  History:  Procedure Laterality Date   A/V FISTULAGRAM N/A 01/17/2023   Procedure: A/V Fistulagram;  Surgeon: Melia Lynwood ORN, MD;  [facility];  Service: Cardiovascular;  Laterality: N/A;   A/V SHUNT INTERVENTION Right 07/23/2023   Procedure: A/V SHUNT INTERVENTION;  Surgeon: Lanis Fonda BRAVO, MD;  Location: [facility];  Service: Cardiovascular;  Laterality: Right;   APPENDECTOMY     AV FISTULA PLACEMENT Right 02/09/2020   Procedure: ARTERIOVENOUS (AV) FISTULA  CREATION RIGHT UPPER EXTREMITY;  Surgeon: Gretta Lonni PARAS, MD;  Location: [facility] OR;  Service: Vascular;  Laterality: Right;   BASCILIC VEIN TRANSPOSITION Right 03/30/2020   Procedure: RIGHT SECOND STAGE BASCILIC VEIN TRANSPOSITION;  Surgeon: Sheree Penne Lonni, MD;  Location: [facility] OR;  Service: Vascular;  Laterality: Right;   CARDIAC CATHETERIZATION  2009   he was found to have a atretic LIMA graft to his LAD and had a patent vein graft supplying his diagonal vessel. At that time, he had 70% narrowing in his LAD beyond a diagonal vessel which was initially treated medically.   COLONOSCOPY WITH PROPOFOL  Left 09/09/2016   Procedure: COLONOSCOPY WITH PROPOFOL ;  Surgeon: Burnette Fallow, MD;  Location: [facility] ENDOSCOPY;  Service: Endoscopy;  Laterality: Left;   CORONARY ANGIOPLASTY WITH STENT PLACEMENT  April 2012   LAD DES   CORONARY ARTERY BYPASS GRAFT  1992   with LIMA to the LAD and diagonal.   CYSTOSCOPY N/A 03/31/2019   Procedure: CYSTOSCOPY FLEXIBLE;  Surgeon: Carolee Sherwood JONETTA DOUGLAS, MD;  Location: [facility];  Service: Urology;  Laterality: N/A;   CYSTOSCOPY WITH URETHRAL DILATATION N/A 05/21/2019   Procedure: CYSTOSCOPY WITH URETHRAL BALLOON DILATATION;  Surgeon: Carolee Sherwood JONETTA DOUGLAS, MD;  Location: [facility];  Service: Urology;  Laterality: N/A;   ESOPHAGOGASTRODUODENOSCOPY N/A 11/16/2019   Procedure: ESOPHAGOGASTRODUODENOSCOPY (EGD);  Surgeon: Dianna Specking, MD;  Location: [facility] ENDOSCOPY;  Service: Endoscopy;  Laterality: N/A;    ESOPHAGOGASTRODUODENOSCOPY (EGD) WITH PROPOFOL  Left 09/08/2016   Procedure: ESOPHAGOGASTRODUODENOSCOPY (EGD) WITH PROPOFOL ;  Surgeon: Saintclair Jasper, MD;  Location: [facility] ENDOSCOPY;  Service: Gastroenterology;  Laterality: Left;   EXCHANGE OF A DIALYSIS CATHETER Right 02/09/2020   Procedure: EXCHANGE OF A DIALYSIS CATHETER;  Surgeon: Gretta Lonni PARAS, MD;  Location: [facility] OR;  Service: Vascular;  Laterality: Right;   GIVENS CAPSULE STUDY N/A 11/16/2019   Procedure: GIVENS CAPSULE STUDY;  Surgeon: Dianna Specking, MD;  Location: [facility] ENDOSCOPY;  Service: Endoscopy;  Laterality: N/A;   INGUINAL HERNIA REPAIR     don't remeimber which side   IR FLUORO GUIDE CV LINE RIGHT  02/05/2020   IR US  GUIDE VASC ACCESS RIGHT  02/05/2020   LAPAROSCOPIC NEPHRECTOMY Right 03/31/2019   Procedure: RIGHT  HAND ASSIST LAPAROSCOPIC NEPHRECTOMY;  Surgeon: Carolee Sherwood JONETTA DOUGLAS, MD;  Location[facility];  Service: Urology;  Laterality: Right;   PACEMAKER IMPLANT N/A 12/25/2016   Procedure: PACEMAKER IMPLANT;  Surgeon: Fernande Elspeth BROCKS, MD;  Location: [facility] INVASIVE CV LAB;  Service: Cardiovascular;  Laterality: N/A;   PERIPHERAL VASCULAR BALLOON ANGIOPLASTY Right 01/17/2023   Procedure: PERIPHERAL VASCULAR BALLOON ANGIOPLASTY;  Surgeon: Melia Lynwood ORN, MD;  [facility];  Service: Cardiovascular;  Laterality: Right;  basilic inflow   TEE WITHOUT CARDIOVERSION N/A 02/06/2020   Procedure: TRANSESOPHAGEAL ECHOCARDIOGRAM (TEE);  Surgeon: Raford Riggs, MD;  Location: [facility] ENDOSCOPY;  Service: Cardiovascular;  Laterality: N/A;   TONSILLECTOMY     VIDEO BRONCHOSCOPY WITH ENDOBRONCHIAL NAVIGATION Right 07/17/2023   Procedure: VIDEO BRONCHOSCOPY WITH ENDOBRONCHIAL NAVIGATION;  Surgeon: Shelah Lamar RAMAN, MD;  Location: [facility] ENDOSCOPY;  Service: Pulmonary;  Laterality: Right;    Allergies  Allergen Reactions   Gabapentin  Other (See Comments)    Dizziness Loss of mental capacity   Simvastatin Other (See Comments)    Abdominal cramps    Nitrostat [Nitroglycerin] Other (See Comments)    Causes blood pressure to bottom out    Prior to Admission medications   Medication Sig Start Date End Date Taking? Authorizing Provider  acetaminophen  (TYLENOL ) 500 MG tablet Take 1,500-2,000 mg by mouth daily as needed for mild pain (pain score 1-3), moderate pain (pain score 4-6) or headache.    [provider]  allopurinol  (ZYLOPRIM ) 300 MG tablet Take 300 mg by mouth daily.    [provider]  allopurinol  (ZYLOPRIM ) 300 MG tablet Take 1 tablet (300 mg total) by mouth daily. Patient not taking: Reported on 07/26/2023 01/05/23   Clarice Nottingham, MD  aspirin  EC 81 MG tablet Take 81 mg by mouth daily. Swallow whole.    [provider]  atorvastatin  (LIPITOR) 20 MG tablet Take 1 tablet (20 mg total) by mouth daily. 01/05/23     atorvastatin  (LIPITOR) 20 MG tablet Take 1 tablet (20 mg total) by mouth daily. 09/19/23   Swinyer, Rosaline HERO, NP  B Complex-C (B-COMPLEX WITH VITAMIN C) tablet Take 1 tablet  by mouth daily.    [provider]  gabapentin  (NEURONTIN ) 100 MG capsule Take 1 capsule (100 mg total) by mouth at bedtime. 12/26/22     gabapentin  (NEURONTIN ) 300 MG capsule Take 1 capsule (300 mg total) by mouth at bedtime. 11/22/22     loperamide (IMODIUM A-D) 2 MG tablet Take 2 mg by mouth as needed for diarrhea or loose stools.    [provider]  midodrine  (PROAMATINE ) 2.5 MG tablet TAKE 1 TABLET BY MOUTH IMMEDIATELY AFTER DIALYSIS THEN ANOTHER TABLET 4 HOURS LATER 10/05/22   Fernande Elspeth BROCKS, MD  omeprazole (PRILOSEC OTC) 20 MG tablet Take 20 mg by mouth daily.    [provider]  pantoprazole  (PROTONIX ) 40 MG tablet Take 40 mg by mouth daily.    [provider]  pantoprazole  (PROTONIX ) 40 MG tablet Take 1 tablet (40 mg total) by mouth daily. Patient not taking: Reported on 07/26/2023 11/14/22       Social History   Socioeconomic History   Marital status: Married    Spouse  name: Not on file   Number of children: 3   Years of education: Not on file   Highest education level: Not on file  Occupational History   Not on file  Tobacco Use   Smoking status: Former    Current packs/day: 0.00    Average packs/day: 1 pack/day for 30.0 years (30.0 ttl pk-yrs)    Types: Cigarettes    Quit date: 84    Years since quitting: 40.8   Smokeless tobacco: Never   Tobacco comments:    quit ~ 1985  Vaping Use   Vaping status: Never Used  Substance and Sexual Activity   Alcohol use: Not Currently    Alcohol/week: 0.0 standard drinks of alcohol    Comment: rare beer   Drug use: No   Sexual activity: Not Currently  Other Topics Concern   Not on file  Social History Narrative   Not on file   Social Drivers of Health   Financial Resource Strain: Not on file  Food Insecurity: No Food Insecurity (07/27/2023)   Hunger Vital Sign    Worried About Running Out of Food in the Last Year: Never true    Ran Out of Food in the Last Year: Never true  Transportation Needs: No Transportation Needs (07/27/2023)   PRAPARE - Administrator, Civil Service (Medical): No    Lack of Transportation (Non-Medical): No  Physical Activity: Not on file  Stress: Not on file  Social Connections: Not on file  Intimate Partner Violence: Not At Risk (07/27/2023)   Humiliation, Afraid, Rape, and Kick questionnaire    Fear of Current or Ex-Partner: No    Emotionally Abused: No    Physically Abused: No    Sexually Abused: No   Family History  Problem Relation Age of Onset   Heart disease Father     ROS: Otherwise negative unless mentioned in HPI  Physical Examination  Vitals:   11/09/23 0758  BP: 134/64  Pulse: 80  Resp: 12  Temp: (!) 97.5 F (36.4 C)  SpO2: 98%   Body mass index is 24.21 kg/m.  General:  WDWN in NAD Gait: Not observed HENT: WNL, normocephalic Pulmonary: normal non-labored breathing, without Rales, rhonchi,  wheezing Cardiac: regular, without   Murmurs, rubs or gallops; without carotid bruits Abdomen: soft, NT/ND, no masses Skin: without rashes Vascular Exam/Pulses: Light thrill in the right arm brachiobasilic fistula Extremities: without ischemic changes, without  Gangrene , without cellulitis; without open wounds;  Musculoskeletal: no muscle wasting or atrophy  Neurologic: A&O X 3;  No focal weakness or paresthesias are detected; speech is fluent/normal Psychiatric:  The pt has Normal affect. Lymph:  Unremarkable  CBC    Component Value Date/Time   WBC 9.4 02/12/2020 0149   RBC 3.26 (L) 02/12/2020 0149   HGB 12.9 (L) 07/17/2023 0735   HGB 7.7 (L) 10/20/2019 0924   HCT 38.0 (L) 07/17/2023 0735   HCT 39.9 10/01/2018 1130   PLT 141 (L) 02/12/2020 0149   PLT 146 (L) 10/20/2019 0924   MCV 85.6 02/12/2020 0149   MCH 26.7 02/12/2020 0149   MCHC 31.2 02/12/2020 0149   RDW 22.4 (H) 02/12/2020 0149   LYMPHSABS 0.7 02/01/2020 1618   MONOABS 1.0 02/01/2020 1618   EOSABS 0.1 02/01/2020 1618   BASOSABS 0.0 02/01/2020 1618    BMET    Component Value Date/Time   NA 140 07/17/2023 0735   NA 146 (H) 11/22/2016 1057   K 4.7 07/17/2023 0735   CL 98 07/17/2023 0735   CO2 22 02/12/2020 0149   GLUCOSE 86 07/17/2023 0735   BUN 40 (H) 07/17/2023 0735   BUN 44 (H) 11/22/2016 1057   CREATININE 6.50 (H) 07/17/2023 0735   CREATININE 3.18 (HH) 10/20/2019 0924   CREATININE 1.79 (H) 10/27/2015 0803   CALCIUM  7.6 (L) 02/12/2020 0149   GFRNONAA 16 (L) 02/12/2020 0149   GFRNONAA 17 (L) 10/20/2019 0924   GFRNONAA 36 (L) 10/27/2015 0803   GFRAA 20 (L) 08/12/2019 0959   GFRAA 41 (L) 10/27/2015 0803    COAGS: Lab Results  Component Value Date   INR 1.6 (H) 02/09/2020   INR 2.6 (H) 11/14/2019   INR 1.8 (H) 03/21/2019      ASSESSMENT/PLAN: This is a 83 y.o. male who is undergoing multiple access procedures to the right arm brachiobasilic fistula.  The last access was 3 months ago.  On exam, there is a light thrill.  With low flow  issues, I think it is reasonable to perform a fistulogram to assess the fistula, and attempt to improve inflow.  This has been done previously with 6 mm and 8 mm balloons.  He is aware that if interventions become monthly, the fistula will likely need to be abandoned, [facility] placed and new access made.  At this time, I think fistula salvage is in his best interest.  After discussing the risks and benefits of right arm fistulogram with possible intervention to improve flow for continued use for dialysis, [place] elected to proceed.   Fonda FORBES Rim MD MS [facility] [phone] 11/09/2023  9:12 AM

## 2023-11-12 ENCOUNTER — Encounter (HOSPITAL_COMMUNITY): Payer: Self-pay | Admitting: Vascular Surgery

## 2023-11-14 NOTE — Progress Notes (Signed)
 Remote PPM Transmission

## 2023-11-20 ENCOUNTER — Other Ambulatory Visit (HOSPITAL_COMMUNITY): Payer: Self-pay

## 2023-11-21 ENCOUNTER — Other Ambulatory Visit: Payer: Self-pay

## 2023-11-21 ENCOUNTER — Other Ambulatory Visit (HOSPITAL_COMMUNITY): Payer: Self-pay

## 2023-11-21 MED ORDER — PANTOPRAZOLE SODIUM 40 MG PO TBEC
40.0000 mg | DELAYED_RELEASE_TABLET | Freq: Every day | ORAL | 11 refills | Status: AC
  Filled 2023-11-21: qty 30, 30d supply, fill #0
  Filled 2023-12-14: qty 30, 30d supply, fill #1
  Filled 2024-01-13: qty 30, 30d supply, fill #2
  Filled 2024-02-12: qty 30, 30d supply, fill #3

## 2023-11-25 ENCOUNTER — Ambulatory Visit: Payer: Self-pay | Admitting: Cardiology

## 2023-12-13 ENCOUNTER — Encounter: Admitting: Cardiology

## 2023-12-19 ENCOUNTER — Encounter (HOSPITAL_COMMUNITY): Admission: RE | Disposition: A | Payer: Self-pay | Attending: Vascular Surgery

## 2023-12-19 ENCOUNTER — Encounter (HOSPITAL_COMMUNITY): Payer: Self-pay | Admitting: Vascular Surgery

## 2023-12-19 ENCOUNTER — Ambulatory Visit (HOSPITAL_COMMUNITY)
Admission: RE | Admit: 2023-12-19 | Discharge: 2023-12-19 | Disposition: A | Attending: Vascular Surgery | Admitting: Vascular Surgery

## 2023-12-19 ENCOUNTER — Other Ambulatory Visit: Payer: Self-pay

## 2023-12-19 DIAGNOSIS — N186 End stage renal disease: Secondary | ICD-10-CM | POA: Diagnosis not present

## 2023-12-19 DIAGNOSIS — Z992 Dependence on renal dialysis: Secondary | ICD-10-CM | POA: Diagnosis not present

## 2023-12-19 DIAGNOSIS — T82858A Stenosis of vascular prosthetic devices, implants and grafts, initial encounter: Secondary | ICD-10-CM

## 2023-12-19 HISTORY — PX: A/V FISTULAGRAM: CATH118298

## 2023-12-19 HISTORY — PX: VENOUS ANGIOPLASTY: CATH118376

## 2023-12-19 SURGERY — A/V FISTULAGRAM
Anesthesia: LOCAL | Site: Arm Upper | Laterality: Right

## 2023-12-19 MED ORDER — LIDOCAINE HCL (PF) 1 % IJ SOLN
INTRAMUSCULAR | Status: AC
  Filled 2023-12-19: qty 30

## 2023-12-19 MED ORDER — IODIXANOL 320 MG/ML IV SOLN
INTRAVENOUS | Status: DC | PRN
  Administered 2023-12-19: 30 mL via INTRAVENOUS

## 2023-12-19 MED ORDER — LIDOCAINE HCL (PF) 1 % IJ SOLN
INTRAMUSCULAR | Status: DC | PRN
  Administered 2023-12-19: 2 mL via INTRADERMAL

## 2023-12-19 MED ORDER — HEPARIN (PORCINE) IN NACL 1000-0.9 UT/500ML-% IV SOLN
INTRAVENOUS | Status: DC | PRN
  Administered 2023-12-19: 500 mL

## 2023-12-19 SURGICAL SUPPLY — 9 items
BALLOON MUSTANG 7X60X75 (BALLOONS) IMPLANT
DEVICE INFLATION ENCORE 26 (MISCELLANEOUS) IMPLANT
KIT MICROPUNCTURE NIT STIFF (SHEATH) IMPLANT
KIT PV (KITS) ×2 IMPLANT
SHEATH GLIDE SLENDER 4/5FR (SHEATH) IMPLANT
SHEATH PROBE COVER 6X72 (BAG) IMPLANT
TRAY PV CATH (CUSTOM PROCEDURE TRAY) ×2 IMPLANT
TUBING CIL FLEX 10 FLL-RA (TUBING) IMPLANT
WIRE BENTSON .035X145CM (WIRE) IMPLANT

## 2023-12-19 NOTE — Op Note (Signed)
° ° °  [name] MRN: [id number] DOB: 1940-12-20 Sex: male  12/19/2023 Pre-operative Diagnosis: ESRD on HD Post-operative diagnosis:  Same Surgeon:  Norman GORMAN Serve, MD Procedure Performed:  Outpatient evaluation, level 3 Ultrasound-guided access of dialysis circuit, fistulogram with central venogram, peripheral balloon angioplasty.  [id number]  Indications: Mr. Elaine is a 83 year old male with ESRD on HD and presented with HD access center for poorly functioning right arm AV fistula with decreased flows and prolonged bleeding.  His last HD session was this morning.  Fistulogram was offered and risks and benefits were reviewed.  He elected to proceed.  Findings:  Widely patent central venous system and upper arm outflow vein.  Approximate 60% stenosis in the mid fistula.  The proximal portion of the fistula and anastomosis are small but patent without flow-limiting stenosis.   Procedure:  The patient was identified in the holding area and taken to the cath lab  The patient was then placed supine on the table and prepped and draped in the usual sterile fashion.  A time out was called.  Ultrasound was used to evaluate the right arm AV access. This was accessed under u/s guidance. An 018 wire was advanced without resistance, a micropuncture sheath was placed and fistulagram obtained which demonstrated the above findings.  This access was then upsized to a 5 F short sheath.  The lesion was crossed with a Bentson wire and treated with a 7 mm x 60 mm Mustang balloon.  The wire and sheath were removed and the access was managed with a 4 Monocryl figure-of-eight suture for hemostasis.  Contrast: 30 cc Sedation: None  Impression: 60% stenosis in the mid fistula treated with a 7 mm x 60 mm Mustang balloon.  The proximal portion of the fistula that was previously treated by Dr. Lanis is small but patent and without flow-limiting stenosis.   Norman GORMAN Serve MD [facility] Office: [phone]

## 2023-12-19 NOTE — H&P (Signed)
 HD ACCESS CENTER H&P   Patient ID: Paul Price, male   DOB: June 26, 1940, 83 y.o.   MRN: [id number]  Subjective:     HPI Paul Price is a 83 y.o. male with ESRD presenting for evaluation of HD access and consideration of intervention. Referred by: Dr. Gearline Having issues decreased flows during sessions.  Past Medical History:  Diagnosis Date   Arthritis    shoulders (09/07/2016)   Atrial fibrillation (HCC)    BPH (benign prostatic hyperplasia)    Chronic kidney disease (CKD), stage III (moderate) (HCC)    Coronary artery disease    DDD (degenerative disc disease), cervical    DVT (deep venous thrombosis) (HCC) 12/2013   LLE   GERD (gastroesophageal reflux disease)    Gout    High cholesterol    History of blood transfusion 09/06/2016   2 u PRBC   History of scarlet fever 1940s   History of stress test 06/2011   No significant ischemia, this is a low risk scan. Clinical correlation recommended Abnormal myocardial perfusion study.   Hx of echocardiogram 05/2009   EF 40-45%, he did have mild annular calcification with mild-to-moderate MR and mild TR as well as aortic valve sclerosis. Estimated RV systolic pressure was 21 mm.   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   Neck pain    not chronic (09/07/2016)   Obesity    OSA on CPAP    Presence of permanent cardiac pacemaker    RBBB    Renal insufficiency    Right renal mass    Spinal stenosis of lumbar region    Family History  Problem Relation Age of Onset   Heart disease Father    Past Surgical History:  Procedure Laterality Date   A/V FISTULAGRAM N/A 01/17/2023   Procedure: A/V Fistulagram;  Surgeon: Melia Lynwood ORN, MD;  [facility];  Service: Cardiovascular;  Laterality: N/A;   A/V FISTULAGRAM Right 11/09/2023   Procedure: A/V Fistulagram;  Surgeon: Lanis Fonda FORBES, MD;  Location: [facility];  Service: Cardiovascular;  Laterality:  Right;   A/V SHUNT INTERVENTION Right 07/23/2023   Procedure: A/V SHUNT INTERVENTION;  Surgeon: Lanis Fonda FORBES, MD;  Location: [facility];  Service: Cardiovascular;  Laterality: Right;   APPENDECTOMY     AV FISTULA PLACEMENT Right 02/09/2020   Procedure: ARTERIOVENOUS (AV) FISTULA  CREATION RIGHT UPPER EXTREMITY;  Surgeon: Gretta Lonni PARAS, MD;  Location: [facility] OR;  Service: Vascular;  Laterality: Right;   BASCILIC VEIN TRANSPOSITION Right 03/30/2020   Procedure: RIGHT SECOND STAGE BASCILIC VEIN TRANSPOSITION;  Surgeon: Sheree Penne Lonni, MD;  Location: [facility] OR;  Service: Vascular;  Laterality: Right;   CARDIAC CATHETERIZATION  2009   he was found to have a atretic LIMA graft to his LAD and had a patent vein graft supplying his diagonal vessel. At that time, he had 70% narrowing in his LAD beyond a diagonal vessel which was initially treated medically.   COLONOSCOPY WITH PROPOFOL  Left 09/09/2016   Procedure: COLONOSCOPY WITH PROPOFOL ;  Surgeon: Burnette Fallow, MD;  Location: [facility] ENDOSCOPY;  Service: Endoscopy;  Laterality: Left;   CORONARY ANGIOPLASTY WITH STENT PLACEMENT  April 2012   LAD DES   CORONARY ARTERY BYPASS GRAFT  1992   with LIMA to the LAD and diagonal.   CYSTOSCOPY N/A 03/31/2019   Procedure: CYSTOSCOPY FLEXIBLE;  Surgeon: Carolee Sherwood BIRCH  III, MD;  Location: [facility];  Service: Urology;  Laterality: N/A;   CYSTOSCOPY WITH URETHRAL DILATATION N/A 05/21/2019   Procedure: CYSTOSCOPY WITH URETHRAL BALLOON DILATATION;  Surgeon: Carolee Sherwood JONETTA DOUGLAS, MD;  Location: [facility]S;  Service: Urology;  Laterality: N/A;   ESOPHAGOGASTRODUODENOSCOPY N/A 11/16/2019   Procedure: ESOPHAGOGASTRODUODENOSCOPY (EGD);  Surgeon: Dianna Specking, MD;  Location: [facility] ENDOSCOPY;  Service: Endoscopy;  Laterality: N/A;   ESOPHAGOGASTRODUODENOSCOPY (EGD) WITH PROPOFOL  Left 09/08/2016   Procedure: ESOPHAGOGASTRODUODENOSCOPY (EGD) WITH PROPOFOL ;  Surgeon: Saintclair Jasper, MD;  Location: [facility] ENDOSCOPY;  Service:  Gastroenterology;  Laterality: Left;   EXCHANGE OF A DIALYSIS CATHETER Right 02/09/2020   Procedure: EXCHANGE OF A DIALYSIS CATHETER;  Surgeon: Gretta Lonni PARAS, MD;  Location: [facility] OR;  Service: Vascular;  Laterality: Right;   GIVENS CAPSULE STUDY N/A 11/16/2019   Procedure: GIVENS CAPSULE STUDY;  Surgeon: Dianna Specking, MD;  Location: [facility] ENDOSCOPY;  Service: Endoscopy;  Laterality: N/A;   INGUINAL HERNIA REPAIR     don't remeimber which side   IR FLUORO GUIDE CV LINE RIGHT  02/05/2020   IR US  GUIDE VASC ACCESS RIGHT  02/05/2020   LAPAROSCOPIC NEPHRECTOMY Right 03/31/2019   Procedure: RIGHT  HAND ASSIST LAPAROSCOPIC NEPHRECTOMY;  Surgeon: Carolee Sherwood JONETTA DOUGLAS, MD;  Location: [facility];  Service: Urology;  Laterality: Right;   PACEMAKER IMPLANT N/A 12/25/2016   Procedure: PACEMAKER IMPLANT;  Surgeon: Fernande Elspeth BROCKS, MD;  Location: [facility] INVASIVE CV LAB;  Service: Cardiovascular;  Laterality: N/A;   PERIPHERAL VASCULAR BALLOON ANGIOPLASTY Right 01/17/2023   Procedure: PERIPHERAL VASCULAR BALLOON ANGIOPLASTY;  Surgeon: Melia Lynwood ORN, MD;  Location: [facility];  Service: Cardiovascular;  Laterality: Right;  basilic inflow   TEE WITHOUT CARDIOVERSION N/A 02/06/2020   Procedure: TRANSESOPHAGEAL ECHOCARDIOGRAM (TEE);  Surgeon: Raford Riggs, MD;  Location: [facility] ENDOSCOPY;  Service: Cardiovascular;  Laterality: N/A;   TONSILLECTOMY     VENOUS ANGIOPLASTY Right 11/09/2023   Procedure: VENOUS ANGIOPLASTY;  Surgeon: Lanis Fonda BRAVO, MD;  Location: [facility];  Service: Cardiovascular;  Laterality: Right;  Inflow Cephalic Vein/Juxta Anastomosis; Mid-AVF   VIDEO BRONCHOSCOPY WITH ENDOBRONCHIAL NAVIGATION Right 07/17/2023   Procedure: VIDEO BRONCHOSCOPY WITH ENDOBRONCHIAL NAVIGATION;  Surgeon: Shelah Lamar RAMAN, MD;  Location: [facility] ENDOSCOPY;  Service: Pulmonary;  Laterality: Right;    Short Social History:  Social History   Tobacco Use   Smoking status: Former    Current packs/day: 0.00    Average  packs/day: 1 pack/day for 30.0 years (30.0 ttl pk-yrs)    Types: Cigarettes    Quit date: 1985    Years since quitting: 40.9   Smokeless tobacco: Never   Tobacco comments:    quit ~ 1985  Substance Use Topics   Alcohol use: Not Currently    Alcohol/week: 0.0 standard drinks of alcohol    Comment: rare beer    Allergies  Allergen Reactions   Gabapentin  Other (See Comments)    Dizziness Loss of mental capacity   Simvastatin Other (See Comments)    Abdominal cramps   Nitrostat [Nitroglycerin] Other (See Comments)    Causes blood pressure to bottom out    No current facility-administered medications for this encounter.    REVIEW OF SYSTEMS All other systems were reviewed and are negative     Objective:   Objective   Vitals:   12/19/23 0932 12/19/23 0941  BP: (!) 141/67 (!) 141/67  Pulse: 83 61  Resp: 12 14  Temp: (!) 97.5 F (36.4 C)  TempSrc: Oral   SpO2: 99% 98%   There is no height or weight on file to calculate BMI.  Physical Exam General: no acute distress Cardiac: hemodynamically stable Extremities: Palpable thrill in right arm AVF  Data: Reviewed fistulogram from Dr. Silver from 10/31 A long segment of the proximal fistula was treated with a 5 mm balloon.     Assessment/Plan:   Paul Price is a 83 y.o. male with ESRD and a poorly functioning right arm AVF with decreased flows. Having issues with decreased flows. Last HD session this morning.  We discussed first-line option of fistulogram to better evaluate flow as well as potentially treat flow-limiting stenoses that could put this access at risk of future thrombosis.  I discussed that we may use a radial access. Risks and benefits were reviewed and the patient elected to proceed.   Norman Serve, MD [facility]

## 2023-12-27 ENCOUNTER — Encounter: Payer: Self-pay | Admitting: Acute Care

## 2023-12-31 ENCOUNTER — Other Ambulatory Visit: Payer: Self-pay

## 2023-12-31 ENCOUNTER — Other Ambulatory Visit (HOSPITAL_COMMUNITY): Payer: Self-pay

## 2023-12-31 MED ORDER — ALLOPURINOL 300 MG PO TABS
300.0000 mg | ORAL_TABLET | Freq: Every day | ORAL | 0 refills | Status: DC
  Filled 2023-12-31: qty 30, 30d supply, fill #0

## 2024-01-29 ENCOUNTER — Encounter (HOSPITAL_COMMUNITY): Payer: Self-pay

## 2024-01-30 ENCOUNTER — Other Ambulatory Visit: Payer: Self-pay

## 2024-01-30 ENCOUNTER — Other Ambulatory Visit (HOSPITAL_COMMUNITY): Payer: Self-pay

## 2024-01-30 MED ORDER — ALLOPURINOL 300 MG PO TABS
300.0000 mg | ORAL_TABLET | Freq: Every day | ORAL | 0 refills | Status: AC
  Filled 2024-01-30: qty 30, 30d supply, fill #0

## 2024-01-31 ENCOUNTER — Encounter (HOSPITAL_COMMUNITY)
Admission: RE | Disposition: A | Discharge status: Home / Self Care | Payer: Self-pay | Source: Home / Self Care | Attending: Surgery

## 2024-01-31 ENCOUNTER — Other Ambulatory Visit: Payer: Self-pay

## 2024-01-31 ENCOUNTER — Ambulatory Visit (HOSPITAL_COMMUNITY)
Admission: RE | Admit: 2024-01-31 | Discharge: 2024-01-31 | Disposition: A | Discharge status: Home / Self Care | Attending: Surgery | Admitting: Surgery

## 2024-01-31 ENCOUNTER — Encounter (HOSPITAL_COMMUNITY): Payer: Self-pay | Admitting: Surgery

## 2024-01-31 DIAGNOSIS — I120 Hypertensive chronic kidney disease with stage 5 chronic kidney disease or end stage renal disease: Secondary | ICD-10-CM | POA: Diagnosis not present

## 2024-01-31 DIAGNOSIS — Z992 Dependence on renal dialysis: Secondary | ICD-10-CM | POA: Diagnosis not present

## 2024-01-31 DIAGNOSIS — Z87891 Personal history of nicotine dependence: Secondary | ICD-10-CM | POA: Insufficient documentation

## 2024-01-31 DIAGNOSIS — N186 End stage renal disease: Secondary | ICD-10-CM | POA: Diagnosis not present

## 2024-01-31 DIAGNOSIS — T82858A Stenosis of vascular prosthetic devices, implants and grafts, initial encounter: Secondary | ICD-10-CM | POA: Insufficient documentation

## 2024-01-31 DIAGNOSIS — Y832 Surgical operation with anastomosis, bypass or graft as the cause of abnormal reaction of the patient, or of later complication, without mention of misadventure at the time of the procedure: Secondary | ICD-10-CM | POA: Insufficient documentation

## 2024-01-31 HISTORY — PX: VENOUS ANGIOPLASTY: CATH118376

## 2024-01-31 HISTORY — PX: A/V FISTULAGRAM: CATH118298

## 2024-01-31 MED ORDER — LIDOCAINE HCL (PF) 1 % IJ SOLN
INTRAMUSCULAR | Status: AC
  Filled 2024-01-31: qty 30

## 2024-01-31 MED ORDER — LIDOCAINE HCL (PF) 1 % IJ SOLN
INTRAMUSCULAR | Status: DC | PRN
  Administered 2024-01-31: 2 mL via SUBCUTANEOUS

## 2024-01-31 MED ORDER — IODIXANOL 320 MG/ML IV SOLN
INTRAVENOUS | Status: DC | PRN
  Administered 2024-01-31: 40 mL via INTRAVENOUS

## 2024-01-31 MED ORDER — HEPARIN (PORCINE) IN NACL 1000-0.9 UT/500ML-% IV SOLN
INTRAVENOUS | Status: DC | PRN
  Administered 2024-01-31: 500 mL

## 2024-01-31 NOTE — H&P (Signed)
 "                                    [facility]  [name] MRN: [id number] DOB: March 24, 1940 Sex: male   REASON FOR VISIT:    Dialysis access  HISOTRY OF PRESENT ILLNESS:    Paul CHEATUM is a 84 y.o. male who is status post right arm basilic vein transposition in 2022.  He has recently had issues with his flow rates.  He underwent treatment and October and December for low flow rates.  He is continuing to have decreased flow rates.  He does have some pain in the fistula near the antecubital crease.  He does not have any swelling.   PAST MEDICAL HISTORY:   Past Medical History:  Diagnosis Date   Arthritis    shoulders (09/07/2016)   Atrial fibrillation (HCC)    BPH (benign prostatic hyperplasia)    Chronic kidney disease (CKD), stage III (moderate) (HCC)    Coronary artery disease    DDD (degenerative disc disease), cervical    DVT (deep venous thrombosis) (HCC) 12/2013   LLE   GERD (gastroesophageal reflux disease)    Gout    High cholesterol    History of blood transfusion 09/06/2016   2 u PRBC   History of scarlet fever 1940s   History of stress test 06/2011   No significant ischemia, this is a low risk scan. Clinical correlation recommended Abnormal myocardial perfusion study.   Hx of echocardiogram 05/2009   EF 40-45%, he did have mild annular calcification with mild-to-moderate MR and mild TR as well as aortic valve sclerosis. Estimated RV systolic pressure was 21 mm.   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   Neck pain    not chronic (09/07/2016)   Obesity    OSA on CPAP    Presence of permanent cardiac pacemaker    RBBB    Renal insufficiency    Right renal mass    Spinal stenosis of lumbar region      FAMILY HISTORY:   Family History  Problem Relation Age of Onset   Heart disease Father     SOCIAL HISTORY:   Social History    Tobacco Use   Smoking status: Former    Current packs/day: 0.00    Average packs/day: 1 pack/day for 30.0 years (30.0 ttl pk-yrs)    Types: Cigarettes    Quit date: 1985    Years since quitting: 41.0   Smokeless tobacco: Never   Tobacco comments:    quit ~ 1985  Substance Use Topics   Alcohol use: Not Currently    Alcohol/week: 0.0 standard drinks of alcohol    Comment: rare beer     ALLERGIES:   Allergies[1]   CURRENT MEDICATIONS:   No current facility-administered medications for this encounter.    REVIEW OF SYSTEMS:   [X]  denotes positive finding, [ ]  denotes negative finding Cardiac  Comments:  Chest pain or chest pressure:    Shortness of breath upon exertion:    Short of breath when lying flat:    Irregular heart rhythm:        Vascular    Pain in calf, thigh, or hip brought on by ambulation:    Pain in feet at night that  wakes you up from your sleep:     Blood clot in your veins:    Leg swelling:         Pulmonary    Oxygen at home:    Productive cough:     Wheezing:         Neurologic    Sudden weakness in arms or legs:     Sudden numbness in arms or legs:     Sudden onset of difficulty speaking or slurred speech:    Temporary loss of vision in one eye:     Problems with dizziness:         Gastrointestinal    Blood in stool:     Vomited blood:         Genitourinary    Burning when urinating:     Blood in urine:        Psychiatric    Major depression:         Hematologic    Bleeding problems:    Problems with blood clotting too easily:        Skin    Rashes or ulcers:        Constitutional    Fever or chills:      PHYSICAL EXAM:   Vitals:   01/31/24 0727 01/31/24 0728  BP:  (!) 149/66  Pulse:  78  Resp: 14 12  SpO2:  100%    GENERAL: The patient is a well-nourished male, in no acute distress. The vital signs are documented above. CARDIAC: There is a regular rate and rhythm.  VASCULAR: Palpable thrill within right arm  fistula PULMONARY: Non-labored respirations  SKIN: There are no ulcers or rashes noted. PSYCHIATRIC: The patient has a normal affect.  STUDIES:     MEDICAL ISSUES:   ESRD I discussed proceeding with a fistulogram to try and address the stenosis near the proximal fistula as well as the mid fistula.  We also discussed that the vein is becoming more more sclerotic and he may need to consider alternative access if we do not start getting durable results with intervention    Malvina New, IV, MD, FACS [facility] [phone] Pager [phone]     [1]  Allergies Allergen Reactions   Gabapentin  Other (See Comments)    Dizziness Loss of mental capacity   Simvastatin Other (See Comments)    Abdominal cramps   Nitrostat [Nitroglycerin] Other (See Comments)    Causes blood pressure to bottom out   "

## 2024-01-31 NOTE — CV Procedure (Signed)
" ° ° °  [name] MRN: [id number] DOB: 1940-08-20 Sex: male  01/31/2024 Pre-operative Diagnosis: ESRD Post-operative diagnosis:  Same Surgeon:  Malvina New Procedure Performed:  1.  Ultrasound-guided access, right arm fistula  2.  Fistulogram [phone])  3.  Peripheral venoplasty x 2 (63097)  #4:  clinic visit [phone]   Indications: This is a an 84 year old gentleman with end-stage renal disease who is having persistent issues with low flow rates he comes in today for fistulogram  Procedure:  The patient was identified in the holding area and taken to room 8.  The patient was then placed supine on the table and prepped and draped in the usual sterile fashion.  A time out was called.  Ultrasound was used to evaluate the fistula.  The vein was patent and compressible.  A digital ultrasound image was acquired.  The fistula was then accessed under ultrasound guidance using a micropuncture needle.  An 018 wire was then asvanced without resistance and a micropuncture sheath was placed.  Contrast injections were then performed through the sheath.  Findings: No evidence of central venous stenosis.  There is a long segment narrowing in the proximal fistula just beyond the arteriovenous anastomosis.  In addition there is a mid fistula stenosis surrounding to ectatic areas of the fistula   Intervention: After the above images were acquired the decision was made to proceed with intervention.  A Glidewire advantage was advanced into the arterial system.  A 6 French sheath was placed.  I first treated the mid fistula stenosis with an 8 x 40 balloon.  I then treated the proximal fistula with a 5 x 80 balloon.  Completion imaging showed resolution of the stenosis in the proximal fistula.  The mid fistula stenosis was significantly improved.  Patient had a fair amount of discomfort with balloon inflation.  Wire was removed and the access was removed with a Monocryl suture  Impression:  #1  Successful  venoplasty of the mid fistula stenosis using an 8 x 40 balloon  #2 successful balloon venoplasty of the proximal fistula using 5 x 80 balloon  #3  No evidence of central venous stenosis  #4  Access remains amenable to percutaneous intervention and is ready for immediate use    V. Malvina New, M.D., [facility] [place] Office: [phone] Pager:  [phone]  "

## 2024-02-06 ENCOUNTER — Ambulatory Visit: Attending: Cardiology

## 2024-02-06 DIAGNOSIS — I442 Atrioventricular block, complete: Secondary | ICD-10-CM

## 2024-02-06 LAB — CUP PACEART REMOTE DEVICE CHECK
Battery Remaining Longevity: 2 mo
Battery Voltage: 2.68 V
Brady Statistic RA Percent Paced: 0.97 %
Brady Statistic RV Percent Paced: 98.98 %
Date Time Interrogation Session: 20260128040504
Implantable Lead Connection Status: 753985
Implantable Lead Connection Status: 753985
Implantable Lead Implant Date: 20181217
Implantable Lead Implant Date: 20181217
Implantable Lead Location: 753859
Implantable Lead Location: 753859
Implantable Lead Model: 3830
Implantable Lead Model: 5076
Implantable Pulse Generator Implant Date: 20181217
Lead Channel Impedance Value: 304 Ohm
Lead Channel Impedance Value: 304 Ohm
Lead Channel Impedance Value: 399 Ohm
Lead Channel Impedance Value: 456 Ohm
Lead Channel Pacing Threshold Amplitude: 0.5 V
Lead Channel Pacing Threshold Amplitude: 0.75 V
Lead Channel Pacing Threshold Pulse Width: 0.4 ms
Lead Channel Pacing Threshold Pulse Width: 0.4 ms
Lead Channel Sensing Intrinsic Amplitude: 0.125 mV
Lead Channel Sensing Intrinsic Amplitude: 0.125 mV
Lead Channel Sensing Intrinsic Amplitude: 11.625 mV
Lead Channel Sensing Intrinsic Amplitude: 11.625 mV
Lead Channel Setting Pacing Amplitude: 2 V
Lead Channel Setting Pacing Amplitude: 2.5 V
Lead Channel Setting Pacing Pulse Width: 1 ms
Lead Channel Setting Sensing Sensitivity: 1.2 mV
Zone Setting Status: 755011
Zone Setting Status: 755011

## 2024-02-07 ENCOUNTER — Ambulatory Visit: Admitting: Cardiology

## 2024-02-14 NOTE — Progress Notes (Signed)
 Remote PPM Transmission

## 2024-02-28 ENCOUNTER — Ambulatory Visit: Admitting: Student

## 2024-03-08 ENCOUNTER — Ambulatory Visit

## 2024-04-01 ENCOUNTER — Ambulatory Visit

## 2024-04-08 ENCOUNTER — Ambulatory Visit

## 2024-04-14 ENCOUNTER — Ambulatory Visit: Admitting: Radiation Oncology

## 2024-05-07 ENCOUNTER — Ambulatory Visit

## 2024-08-06 ENCOUNTER — Ambulatory Visit

## 2024-11-05 ENCOUNTER — Ambulatory Visit

## 2025-02-04 ENCOUNTER — Ambulatory Visit
